# Patient Record
Sex: Male | Born: 1945 | Race: White | Hispanic: No | Marital: Married | Smoking: Former smoker
Health system: Southern US, Community
[De-identification: ages and names within clinical notes are randomized; demographics above are authoritative.]

## PROBLEM LIST (undated history)

## (undated) DIAGNOSIS — H9190 Unspecified hearing loss, unspecified ear: Secondary | ICD-10-CM

## (undated) DIAGNOSIS — M1990 Unspecified osteoarthritis, unspecified site: Secondary | ICD-10-CM

## (undated) DIAGNOSIS — D649 Anemia, unspecified: Secondary | ICD-10-CM

## (undated) DIAGNOSIS — Z992 Dependence on renal dialysis: Secondary | ICD-10-CM

## (undated) DIAGNOSIS — E039 Hypothyroidism, unspecified: Secondary | ICD-10-CM

## (undated) DIAGNOSIS — I350 Nonrheumatic aortic (valve) stenosis: Secondary | ICD-10-CM

## (undated) DIAGNOSIS — G4730 Sleep apnea, unspecified: Secondary | ICD-10-CM

## (undated) DIAGNOSIS — Z9889 Other specified postprocedural states: Secondary | ICD-10-CM

## (undated) DIAGNOSIS — I209 Angina pectoris, unspecified: Secondary | ICD-10-CM

## (undated) DIAGNOSIS — N289 Disorder of kidney and ureter, unspecified: Secondary | ICD-10-CM

## (undated) DIAGNOSIS — N189 Chronic kidney disease, unspecified: Secondary | ICD-10-CM

## (undated) DIAGNOSIS — E785 Hyperlipidemia, unspecified: Secondary | ICD-10-CM

## (undated) DIAGNOSIS — I499 Cardiac arrhythmia, unspecified: Secondary | ICD-10-CM

## (undated) DIAGNOSIS — C801 Malignant (primary) neoplasm, unspecified: Secondary | ICD-10-CM

## (undated) DIAGNOSIS — R0600 Dyspnea, unspecified: Secondary | ICD-10-CM

## (undated) DIAGNOSIS — Z720 Tobacco use: Secondary | ICD-10-CM

## (undated) DIAGNOSIS — J449 Chronic obstructive pulmonary disease, unspecified: Secondary | ICD-10-CM

## (undated) DIAGNOSIS — I2119 ST elevation (STEMI) myocardial infarction involving other coronary artery of inferior wall: Secondary | ICD-10-CM

## (undated) DIAGNOSIS — F419 Anxiety disorder, unspecified: Secondary | ICD-10-CM

## (undated) DIAGNOSIS — I2510 Atherosclerotic heart disease of native coronary artery without angina pectoris: Secondary | ICD-10-CM

## (undated) DIAGNOSIS — I10 Essential (primary) hypertension: Secondary | ICD-10-CM

## (undated) DIAGNOSIS — R011 Cardiac murmur, unspecified: Secondary | ICD-10-CM

## (undated) DIAGNOSIS — I639 Cerebral infarction, unspecified: Secondary | ICD-10-CM

## (undated) DIAGNOSIS — K219 Gastro-esophageal reflux disease without esophagitis: Secondary | ICD-10-CM

## (undated) DIAGNOSIS — Z8719 Personal history of other diseases of the digestive system: Secondary | ICD-10-CM

## (undated) DIAGNOSIS — R112 Nausea with vomiting, unspecified: Secondary | ICD-10-CM

## (undated) HISTORY — DX: Nonrheumatic aortic (valve) stenosis: I35.0

## (undated) HISTORY — DX: Hyperlipidemia, unspecified: E78.5

## (undated) HISTORY — DX: Tobacco use: Z72.0

## (undated) HISTORY — PX: HERNIA REPAIR: SHX51

## (undated) HISTORY — PX: CARDIAC CATHETERIZATION: SHX172

## (undated) HISTORY — DX: Atherosclerotic heart disease of native coronary artery without angina pectoris: I25.10

## (undated) HISTORY — PX: FOOT SURGERY: SHX648

## (undated) HISTORY — PX: NOSE SURGERY: SHX723

## (undated) HISTORY — PX: APPENDECTOMY: SHX54

## (undated) HISTORY — DX: Essential (primary) hypertension: I10

---

## 2000-11-10 ENCOUNTER — Ambulatory Visit (HOSPITAL_COMMUNITY): Admission: RE | Admit: 2000-11-10 | Discharge: 2000-11-10 | Payer: Self-pay | Admitting: Family Medicine

## 2000-11-10 ENCOUNTER — Encounter: Payer: Self-pay | Admitting: Family Medicine

## 2000-11-21 ENCOUNTER — Encounter: Payer: Self-pay | Admitting: Family Medicine

## 2000-11-21 ENCOUNTER — Ambulatory Visit (HOSPITAL_COMMUNITY): Admission: RE | Admit: 2000-11-21 | Discharge: 2000-11-21 | Payer: Self-pay | Admitting: Family Medicine

## 2000-12-25 ENCOUNTER — Encounter: Payer: Self-pay | Admitting: Cardiology

## 2000-12-25 ENCOUNTER — Ambulatory Visit (HOSPITAL_COMMUNITY): Admission: RE | Admit: 2000-12-25 | Discharge: 2000-12-25 | Payer: Self-pay | Admitting: Cardiology

## 2003-03-13 ENCOUNTER — Observation Stay (HOSPITAL_COMMUNITY): Admission: EM | Admit: 2003-03-13 | Discharge: 2003-03-14 | Payer: Self-pay | Admitting: Emergency Medicine

## 2005-08-17 ENCOUNTER — Ambulatory Visit (HOSPITAL_COMMUNITY): Admission: RE | Admit: 2005-08-17 | Discharge: 2005-08-17 | Payer: Self-pay | Admitting: Family Medicine

## 2006-06-07 ENCOUNTER — Encounter: Payer: Self-pay | Admitting: Emergency Medicine

## 2006-06-07 ENCOUNTER — Ambulatory Visit: Payer: Self-pay | Admitting: Cardiology

## 2006-06-07 ENCOUNTER — Encounter: Payer: Self-pay | Admitting: Vascular Surgery

## 2006-06-07 ENCOUNTER — Inpatient Hospital Stay (HOSPITAL_COMMUNITY): Admission: AD | Admit: 2006-06-07 | Discharge: 2006-06-19 | Payer: Self-pay | Admitting: Cardiology

## 2006-06-08 ENCOUNTER — Encounter: Payer: Self-pay | Admitting: Vascular Surgery

## 2006-06-14 HISTORY — PX: CORONARY ARTERY BYPASS GRAFT: SHX141

## 2006-07-03 ENCOUNTER — Ambulatory Visit: Payer: Self-pay | Admitting: Cardiology

## 2006-07-04 ENCOUNTER — Ambulatory Visit: Payer: Self-pay | Admitting: Surgery

## 2006-07-11 ENCOUNTER — Encounter (HOSPITAL_COMMUNITY): Admission: RE | Admit: 2006-07-11 | Discharge: 2006-08-10 | Payer: Self-pay | Admitting: Cardiology

## 2006-07-17 ENCOUNTER — Ambulatory Visit: Payer: Self-pay | Admitting: Cardiology

## 2006-07-17 LAB — CONVERTED CEMR LAB
ALT: 24 units/L (ref 0–40)
AST: 19 units/L (ref 0–37)
Albumin: 3.5 g/dL (ref 3.5–5.2)
Alkaline Phosphatase: 69 units/L (ref 39–117)
Bilirubin, Direct: 0.1 mg/dL (ref 0.0–0.3)
Cholesterol: 135 mg/dL (ref 0–200)
HDL: 24.4 mg/dL — ABNORMAL LOW (ref >39.0)
LDL Cholesterol: 80 mg/dL (ref 0–99)
Total Bilirubin: 0.6 mg/dL (ref 0.3–1.2)
Total CHOL/HDL Ratio: 5.5
Total Protein: 6.4 g/dL (ref 6.0–8.3)
Triglycerides: 155 mg/dL — ABNORMAL HIGH (ref 0–149)
VLDL: 31 mg/dL (ref 0–40)

## 2006-08-09 ENCOUNTER — Ambulatory Visit: Payer: Self-pay | Admitting: Cardiology

## 2006-08-11 ENCOUNTER — Encounter (HOSPITAL_COMMUNITY): Admission: RE | Admit: 2006-08-11 | Discharge: 2006-09-10 | Payer: Self-pay | Admitting: Cardiology

## 2006-09-07 ENCOUNTER — Ambulatory Visit: Payer: Self-pay | Admitting: Cardiology

## 2006-09-11 ENCOUNTER — Encounter (HOSPITAL_COMMUNITY): Admission: RE | Admit: 2006-09-11 | Discharge: 2006-10-11 | Payer: Self-pay | Admitting: Cardiology

## 2006-10-20 ENCOUNTER — Ambulatory Visit: Payer: Self-pay | Admitting: Cardiology

## 2007-04-17 ENCOUNTER — Ambulatory Visit: Payer: Self-pay | Admitting: Cardiology

## 2007-05-22 ENCOUNTER — Ambulatory Visit: Payer: Self-pay

## 2007-05-22 ENCOUNTER — Ambulatory Visit: Payer: Self-pay | Admitting: Cardiology

## 2007-05-22 LAB — CONVERTED CEMR LAB
ALT: 22 units/L (ref 0–53)
AST: 20 units/L (ref 0–37)
Albumin: 3.8 g/dL (ref 3.5–5.2)
Alkaline Phosphatase: 62 units/L (ref 39–117)
Bilirubin, Direct: 0.2 mg/dL (ref 0.0–0.3)
Cholesterol: 167 mg/dL (ref 0–200)
Direct LDL: 107.1 mg/dL
HDL: 20.9 mg/dL — ABNORMAL LOW (ref >39.0)
Total Bilirubin: 0.9 mg/dL (ref 0.3–1.2)
Total CHOL/HDL Ratio: 8
Total Protein: 7.1 g/dL (ref 6.0–8.3)
Triglycerides: 207 mg/dL (ref 0–149)
VLDL: 41 mg/dL — ABNORMAL HIGH (ref 0–40)

## 2008-04-23 ENCOUNTER — Ambulatory Visit: Payer: Self-pay | Admitting: Cardiology

## 2008-04-23 LAB — CONVERTED CEMR LAB
ALT: 27 units/L (ref 0–53)
AST: 24 units/L (ref 0–37)
Albumin: 4.2 g/dL (ref 3.5–5.2)
Alkaline Phosphatase: 72 units/L (ref 39–117)
BUN: 15 mg/dL (ref 6–23)
Bilirubin, Direct: 0.1 mg/dL (ref 0.0–0.3)
CO2: 30 meq/L (ref 19–32)
Calcium: 9.5 mg/dL (ref 8.4–10.5)
Chloride: 97 meq/L (ref 96–112)
Cholesterol: 176 mg/dL (ref 0–200)
Creatinine, Ser: 1.1 mg/dL (ref 0.4–1.5)
GFR calc Af Amer: 88 mL/min
GFR calc non Af Amer: 72 mL/min
Glucose, Bld: 89 mg/dL (ref 70–99)
HDL: 18.9 mg/dL — ABNORMAL LOW (ref >39.0)
LDL Cholesterol: 118 mg/dL — ABNORMAL HIGH (ref 0–99)
Potassium: 4.2 meq/L (ref 3.5–5.1)
Sodium: 134 meq/L — ABNORMAL LOW (ref 135–145)
Total Bilirubin: 0.8 mg/dL (ref 0.3–1.2)
Total CHOL/HDL Ratio: 9.3
Total Protein: 7.3 g/dL (ref 6.0–8.3)
Triglycerides: 194 mg/dL — ABNORMAL HIGH (ref 0–149)
VLDL: 39 mg/dL (ref 0–40)

## 2008-09-19 ENCOUNTER — Encounter: Payer: Self-pay | Admitting: Cardiology

## 2008-10-02 ENCOUNTER — Encounter: Payer: Self-pay | Admitting: Cardiology

## 2008-10-10 ENCOUNTER — Encounter (INDEPENDENT_AMBULATORY_CARE_PROVIDER_SITE_OTHER): Payer: Self-pay | Admitting: *Deleted

## 2009-01-17 DIAGNOSIS — F17200 Nicotine dependence, unspecified, uncomplicated: Secondary | ICD-10-CM

## 2009-01-17 DIAGNOSIS — I10 Essential (primary) hypertension: Secondary | ICD-10-CM | POA: Insufficient documentation

## 2009-01-22 ENCOUNTER — Ambulatory Visit: Payer: Self-pay | Admitting: Cardiology

## 2009-01-22 DIAGNOSIS — R0789 Other chest pain: Secondary | ICD-10-CM | POA: Insufficient documentation

## 2009-01-22 DIAGNOSIS — E782 Mixed hyperlipidemia: Secondary | ICD-10-CM | POA: Insufficient documentation

## 2009-01-27 ENCOUNTER — Telehealth (INDEPENDENT_AMBULATORY_CARE_PROVIDER_SITE_OTHER): Payer: Self-pay

## 2009-01-28 ENCOUNTER — Ambulatory Visit: Payer: Self-pay | Admitting: Cardiology

## 2009-01-28 ENCOUNTER — Ambulatory Visit: Payer: Self-pay

## 2009-01-28 ENCOUNTER — Encounter: Payer: Self-pay | Admitting: Cardiology

## 2009-01-29 ENCOUNTER — Encounter (INDEPENDENT_AMBULATORY_CARE_PROVIDER_SITE_OTHER): Payer: Self-pay | Admitting: *Deleted

## 2009-01-29 LAB — CONVERTED CEMR LAB
ALT: 28 units/L (ref 0–53)
AST: 25 units/L (ref 0–37)
Albumin: 4 g/dL (ref 3.5–5.2)
Alkaline Phosphatase: 76 units/L (ref 39–117)
BUN: 19 mg/dL (ref 6–23)
Bilirubin, Direct: 0.1 mg/dL (ref 0.0–0.3)
CO2: 30 meq/L (ref 19–32)
Calcium: 9.4 mg/dL (ref 8.4–10.5)
Chloride: 101 meq/L (ref 96–112)
Cholesterol: 147 mg/dL (ref 0–200)
Creatinine, Ser: 1.1 mg/dL (ref 0.4–1.5)
Direct LDL: 88.9 mg/dL
GFR calc non Af Amer: 71.97 mL/min (ref >60)
Glucose, Bld: 90 mg/dL (ref 70–99)
HDL: 23.8 mg/dL — ABNORMAL LOW (ref >39.00)
Potassium: 4.4 meq/L (ref 3.5–5.1)
Sodium: 138 meq/L (ref 135–145)
Total Bilirubin: 0.7 mg/dL (ref 0.3–1.2)
Total CHOL/HDL Ratio: 6
Total Protein: 7.8 g/dL (ref 6.0–8.3)
Triglycerides: 237 mg/dL — ABNORMAL HIGH (ref 0.0–149.0)
VLDL: 47.4 mg/dL — ABNORMAL HIGH (ref 0.0–40.0)

## 2010-01-20 ENCOUNTER — Ambulatory Visit (HOSPITAL_COMMUNITY): Admission: RE | Admit: 2010-01-20 | Discharge: 2010-01-20 | Payer: Self-pay | Admitting: Internal Medicine

## 2010-02-02 ENCOUNTER — Ambulatory Visit: Payer: Self-pay | Admitting: Cardiology

## 2010-02-02 DIAGNOSIS — E663 Overweight: Secondary | ICD-10-CM | POA: Insufficient documentation

## 2010-02-10 ENCOUNTER — Ambulatory Visit: Payer: Self-pay | Admitting: Cardiology

## 2010-02-12 LAB — CONVERTED CEMR LAB
ALT: 30 units/L (ref 0–53)
AST: 28 units/L (ref 0–37)
Albumin: 3.9 g/dL (ref 3.5–5.2)
Alkaline Phosphatase: 69 units/L (ref 39–117)
BUN: 35 mg/dL — ABNORMAL HIGH (ref 6–23)
Bilirubin, Direct: 0.2 mg/dL (ref 0.0–0.3)
CO2: 30 meq/L (ref 19–32)
Calcium: 9.4 mg/dL (ref 8.4–10.5)
Chloride: 102 meq/L (ref 96–112)
Cholesterol: 168 mg/dL (ref 0–200)
Creatinine, Ser: 1.5 mg/dL (ref 0.4–1.5)
Direct LDL: 87.8 mg/dL
GFR calc non Af Amer: 49.77 mL/min (ref >60)
Glucose, Bld: 104 mg/dL — ABNORMAL HIGH (ref 70–99)
HDL: 26.4 mg/dL — ABNORMAL LOW (ref >39.00)
Potassium: 4.1 meq/L (ref 3.5–5.1)
Sodium: 139 meq/L (ref 135–145)
Total Bilirubin: 0.5 mg/dL (ref 0.3–1.2)
Total CHOL/HDL Ratio: 6
Total Protein: 6.9 g/dL (ref 6.0–8.3)
Triglycerides: 348 mg/dL — ABNORMAL HIGH (ref 0.0–149.0)
VLDL: 69.6 mg/dL — ABNORMAL HIGH (ref 0.0–40.0)

## 2010-03-09 ENCOUNTER — Telehealth (INDEPENDENT_AMBULATORY_CARE_PROVIDER_SITE_OTHER): Payer: Self-pay | Admitting: *Deleted

## 2010-03-09 ENCOUNTER — Encounter: Payer: Self-pay | Admitting: Cardiology

## 2010-03-24 ENCOUNTER — Telehealth (INDEPENDENT_AMBULATORY_CARE_PROVIDER_SITE_OTHER): Payer: Self-pay | Admitting: *Deleted

## 2010-03-26 ENCOUNTER — Ambulatory Visit (HOSPITAL_BASED_OUTPATIENT_CLINIC_OR_DEPARTMENT_OTHER): Admission: RE | Admit: 2010-03-26 | Discharge: 2010-03-26 | Payer: Self-pay | Admitting: Specialist

## 2010-06-15 NOTE — Progress Notes (Signed)
Summary: Records Request   Faxed OV, EKG & Stress to [facility] at [facility] ([phone]). [name]  March 24, 2010 11:01 AM

## 2010-06-15 NOTE — Letter (Signed)
Summary: [facility] Office Visit Note   [facility] Office Visit Note   Imported By: Sallee Provencal 03/19/2010 16:19:40  _____________________________________________________________________  External Attachment:    Type:   Image     Comment:   External Document

## 2010-06-15 NOTE — Letter (Signed)
Summary: [facility] Surgical Clearance   [facility] Surgical Clearance   Imported By: Sallee Provencal 04/01/2010 17:30:43  _____________________________________________________________________  External Attachment:    Type:   Image     Comment:   External Document

## 2010-06-15 NOTE — Assessment & Plan Note (Signed)
Summary: yearly/sl  Medications Added METOPROLOL SUCCINATE 50 MG XR24H-TAB (METOPROLOL SUCCINATE) one tab once daily LISINOPRIL-HYDROCHLOROTHIAZIDE 20-25 MG TABS (LISINOPRIL-HYDROCHLOROTHIAZIDE) 1 tab once daily LIPITOR 80 MG TABS (ATORVASTATIN CALCIUM) 1 tab at bedtime NITROGLYCERIN 0.4 MG SUBL (NITROGLYCERIN) One tablet under tongue every 5 minutes as needed for chest pain---may repeat times three NAPROXEN 375 MG TABS (NAPROXEN) 1 two times a day      Allergies Added: NKDA  Visit Type:  1 yr f/u Primary Provider:  Dr. Gerarda Fraction  CC:  pt c/o left knee pain...denies any other complaints today.  History of Present Illness: Mr. Melendrez returns today for followup of his cardiovascular disease, status post cardiac bypass grafting 3 years ago. He has risk factors of obesity, tobacco use, hyperlipidemia being treated with a statin, and hypertension.  He lost his job. This was one of the loves  of his life. He is currently smoking about a pack of cigarettes per day. His weight is deteriorated. He is having a lot of problems with his left knee and was prescribed an anti-inflammatory by his primary care. He wanted to talk to me prior to taking it.  He is compliant with his medications. He is having no angina or ischemic symptoms. His last stress test was in September 2010 and low risk.  Current Medications (verified): 1)  Metoprolol Succinate 50 Mg Xr24h-Tab (Metoprolol Succinate) .... One Tab Once Daily 2)  Lisinopril-Hydrochlorothiazide 20-25 Mg Tabs (Lisinopril-Hydrochlorothiazide) .Marland Kitchen.. 1 Tab Once Daily 3)  Lipitor 80 Mg Tabs (Atorvastatin Calcium) .Marland Kitchen.. 1 Tab At Bedtime 4)  Omeprazole 20 Mg Tbec (Omeprazole) .Marland Kitchen.. 1 Tab Once Daily 5)  Aspirin Ec 325 Mg Tbec (Aspirin) .... Take One Tablet By Mouth Daily 6)  Amlodipine Besylate 5 Mg Tabs (Amlodipine Besylate) .Marland Kitchen.. 1 Tab Once Daily 7)  Nitroglycerin 0.4 Mg Subl (Nitroglycerin) .... One Tablet Under Tongue Every 5 Minutes As Needed For Chest  Pain---May Repeat Times Three 8)  Ra Fish Oil 1000 Mg Caps (Omega-3 Fatty Acids) .Marland Kitchen.. 1 Two Times A Day  Allergies (verified): No Known Drug Allergies  Past History:  Past Medical History: Last updated: 2009/02/07 CAD, ARTERY BYPASS GRAFT/ JANUARY 2008 (ICD-414.04) HYPERLIPIDEMIA (ICD-272.4) HYPERTENSION, UNSPECIFIED (ICD-401.9) TOBACCO USER (ICD-305.1)    Past Surgical History: Last updated: Feb 07, 2009 CABG x5..06/14/06  Family History: Last updated: February 07, 2009 Mother died at the age of 21 with a myocardial infarction, his father at the age of 71 with a CVA.  He has one brother  deceased at the age of 13 secondary to myocardial infarction.  He has two brothers at 21 and 67 that are fairly well. One and had a bypass  surgery.    Social History: Last updated: 02/07/09 He resides in [place] with his wife.  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  He continues  to smoke at least one to two packs per day and has been doing so for 40 years.  He states that he may have a six-pack over a month.  Denies any  drug or herbal medications.  He states that he does not watch a specific  diet.  However, recently he has changed to diet soda and has lost 40  pounds over an unspecified duration of time. He denies any specific  exercise.  Review of Systems       negative other than history of present illness  Vital Signs:  Patient profile:   65 year old male Height:      69 inches Weight:  242.8 pounds BMI:     35.98 Pulse rate:   66 / minute Pulse rhythm:   regular BP sitting:   138 / 80  (left arm) Cuff size:   large  Vitals Entered By: Julaine Hua, CMA (February 02, 2010 2:17 PM)  Physical Exam  General:  obese.  no acute distressobese.   Head:  normocephalic and atraumatic Eyes:  PERRLA/EOM intact; conjunctiva and lids normal. Neck:  Neck supple, no JVD. No masses, thyromegaly or abnormal cervical nodes. Chest Chivon Lepage:  no deformities or breast  masses noted Lungs:  inspiratory expiratory rhonchi Heart:  Pdifficult to appreciate, normal S1-S2, no murmur or gallop. Carotids equal bilaterally without bruits Msk:  decreased ROM.  decreased ROM.   Pulses:  pulses normal in all 4 extremities Extremities:  trace left pedal edema and trace right pedal edema.  trace left pedal edema.   Neurologic:  Alert and oriented x 3. Skin:  Intact without lesions or rashes. Psych:  Normal affect.   Problems:  Medical Problems Added: 1)  Dx of Overweight/obesity  (ICD-278.02)  EKG  Procedure date:  02/02/2010  Findings:      normal sinus rhythm, normal EKG  Impression & Recommendations:  Problem # 1:  CAD, ARTERY BYPASS GRAFT/ JANUARY 2008 (ICD-414.04) Assessment Unchanged  His updated medication list for this problem includes:    Metoprolol Succinate 50 Mg Xr24h-tab (Metoprolol succinate) ..... One tab once daily    Lisinopril-hydrochlorothiazide 20-25 Mg Tabs (Lisinopril-hydrochlorothiazide) .Marland Kitchen... 1 tab once daily    Aspirin Ec 325 Mg Tbec (Aspirin) .Marland Kitchen... Take one tablet by mouth daily    Amlodipine Besylate 5 Mg Tabs (Amlodipine besylate) .Marland Kitchen... 1 tab once daily    Nitroglycerin 0.4 Mg Subl (Nitroglycerin) ..... One tablet under tongue every 5 minutes as needed for chest pain---may repeat times three  Problem # 2:  HYPERLIPIDEMIA, MIXED (ICD-272.2) Will schedule fasting lipids and comprehensive metabolic panel His updated medication list for this problem includes:    Lipitor 80 Mg Tabs (Atorvastatin calcium) .Marland Kitchen... 1 tab at bedtime  Problem # 3:  HYPERTENSION, UNSPECIFIED (ICD-401.9) Assessment: Improved  His updated medication list for this problem includes:    Metoprolol Succinate 50 Mg Xr24h-tab (Metoprolol succinate) ..... One tab once daily    Lisinopril-hydrochlorothiazide 20-25 Mg Tabs (Lisinopril-hydrochlorothiazide) .Marland Kitchen... 1 tab once daily    Aspirin Ec 325 Mg Tbec (Aspirin) .Marland Kitchen... Take one tablet by mouth daily     Amlodipine Besylate 5 Mg Tabs (Amlodipine besylate) .Marland Kitchen... 1 tab once daily  Orders: EKG w/ Interpretation (93000)  Problem # 4:  TOBACCO USER (ICD-305.1) Assessment: Unchanged advised to quit  Problem # 5:  OVERWEIGHT/OBESITY (ICD-278.02) Assessment: Deteriorated I have encouraged him to lose 20-25 pounds. This was really help his cardiovascular risk not to mention his chronic degenerative disease and both knees. Advised him not to take nonsteroidal for his knee and he let me write him a prescription for naproxen 375 mg p.o. b.i.d. He needs to take that with food to avoid any gastric irritation. His knee continues to bother him I advised him to seek orthopedic consultation.  Clinical Reports Reviewed:  CXR:  06/16/2006: CXR Results:   Clinical Data:  Postop CABG.   PORTABLE CHEST - 1 VIEW - 06/16/06 - [id number] HOURS:   Comparison:  06/15/06.   Findings:  The Swan-Ganz catheter and chest tubes have been removed   in the interval.  Right IJ sheath remains in the SVC.  The lung   volumes are slightly lower  with worsening bibasilar atelectasis.   There is no edema, pneumothorax or significant pleural effusion.   Cardiomediastinal contours appear stable.   IMPRESSION:   Slight worsening of bibasilar atelectasis.  No edema or pneumothorax.    Read By:  Vivia Ewing,  M.D.   Released By:  Vivia Ewing,  M.D.  Additional Information  External image : [phone]  06/14/2006: CXR Results:   Clinical Data:  CABG.   PORTABLE CHEST - 1 VIEW:   Comparison:  06/13/06.   Findings:  The endotracheal tube terminates approximately 2.1 cm   above the carina.  Nasogastric tube is followed into the stomach.   The right IJ Swan-Ganz catheter terminates in the right pulmonary   artery.  Mediastinal drain and left chest tubes are in place.  Lungs   are low in volume with left base atelectasis.  No pneumothorax.   IMPRESSION:   Left base atelectasis.  No pneumothorax.    Read By:   Luretha Rued.,  M.D.   Released By:  Luretha Rued.,  M.D.  Additional Information  External image : [id number]  Cardiac Cath:  06/07/2006: Cardiac Cath Findings:   CONCLUSION:  1. Preserved left ventricular function.  2. Significant coronary artery disease.   PLAN:  Surgical consultation will be obtained.   Loretha Brasil. Lia Foyer, MD, [facility]  Electronically Signed  12/09/1993: Cardiac Cath Findings:  [facility] : Conclusions: 1. nonobstructive coronary artery disease. 2. Normal left ventricular function.  Howell Rucks, MD  Nuclear Study:  01/28/2009:  Excerise capacity: Good exercise capacity  Blood Pressure response: Normal blood pressure response  Clinical symptoms: No chest pain  ECG impression: No significant ST segment change suggestive of ischemia  Overall impression: Low risk stress nuclear study with very mild inferior ischemia noted predominantly on the short axis images.  Kirk Ruths, MD, [facility]   05/22/2007:  Excerise capacity: Good exercise capcity  Blood Pressure response: Hypertensive blood pressure response (216/81)  Clinical symptoms: No chest pain or dyspnea  ECG impression: No significant ST segment change suggestive of ischemia  Overall impression: Low risk stress nuclear study as noted above.  Satira Sark, MD   12/25/2000:   NM MYOCARDIAL PERFUSION MULTIPLE W/SPECT AND EJECTION   FRACTION:  THE PATIENT UNDERWENT TREADMILL STRESS TEST ON BRUCE   PROTOCOL, EXERCISING 7 MINUTES 32 SECONDS AND ACHIEVING   98% OF TARGET HEART RATE.  AT PEAK EXERCISE, 29.8 MCI   TC-32M SESTAMIBI WAS INJECTED INTRAVENOUSLY FOR MYOCARDIAL   PERFUSION IMAGING.  RESTING EXAM PERFORMED PRIOR TO   EXERCISE USING 10 MCI TC-32M SESTAMIBI.   MYOCARDIAL PERFUSION SPECT IMAGES OBTAINED AFTER EXERCISE   REVEAL MINIMAL APICAL THINNING.  NO FOCAL PERFUSION   DEFECTS IDENTIFIED.  RESTING EXAM UNCHANGED.  NO PULMONARY   UPTAKE OF  TRACER.   NORMAL LEFT VENTRICULAR EJECTION FRACTION OF 71% AS   CALCULATED FROM THE GATED SPECT IMAGES AFTER EXERCISE.   THIS IS DERIVED FROM END DIASTOLIC VOLUME CALCULATION OF   82 ML AND END SYSTOLIC VOLUME OF 23 ML.   Dezarai Prew MOTION   ANALYSIS IS NORMAL.   IMPRESSION:  NORMAL EXAM.  MINIMAL APICAL THINNING NOTED.   TRANSCRIBED DATE:  MA  AS                                           Read [id number] A.  Thornton Papas, M.D.  12/26/2000 08:39                                           Released [id number]   A. Thornton Papas, M.D.   Patient Instructions: 1)  Your physician recommends that you schedule a follow-up appointment in: [place] 2)  Your physician recommends that you return for lab work in: BMET LIPID LIVER  FASTING 278.00 272.4 V58.69 3)  Your physician has recommended you make the following change in your medication:  ADD NAPROXEN 375 MG 1 two times a day TAKE WITH FOOD 4)  Your physician discussed the hazards of tobacco use.  Tobacco use cessation is recommended and techniques and options to help you quit were discussed. 5)  Your physician encouraged you to lose weight for better health.20 -25 POUNDS Prescriptions: NAPROXEN 375 MG TABS (NAPROXEN) 1 two times a day  #60 x 3   Entered by:   Devra Dopp, LPN   Authorized by:   Renella Cunas, MD, [facility]   Signed by:   Devra Dopp, LPN on [id number]   Method used:   Electronically to        [facility] [phone]* (retail)       [address]       [place], [place]  [address]       Ph: [phone] or [phone]       Fax: [phone]   RxIDAL:[id number] NITROGLYCERIN 0.4 MG SUBL (NITROGLYCERIN) One tablet under tongue every 5 minutes as needed for chest pain---may repeat times three  #25 x 7   Entered by:   Julaine Hua, CMA   Authorized by:   Renella Cunas, MD, [facility]   Signed by:   Julaine Hua, CMA on 02/02/2010   Method used:   Electronically to        [facility] [phone]* (retail)       [address]       [place], [place]  [address]       Ph: [phone] or [phone]       Fax: [phone]   RxID:   [id number] METOPROLOL SUCCINATE 50 MG XR24H-TAB (METOPROLOL SUCCINATE) one tab once daily  #90 x 3   Entered by:   Julaine Hua, CMA   Authorized by:   Renella Cunas, MD, [facility]   Signed by:   Julaine Hua, CMA on 02/02/2010   Method used:   Faxed to ...       [facility] Fax) (mail-order)             ,          Ph: [phone]       Fax: [phone]   RxID:   [id number] LIPITOR 80 MG TABS (ATORVASTATIN CALCIUM) 1 tab at bedtime  #90 x 3   Entered by:   Julaine Hua, CMA   Authorized by:   Renella Cunas, MD, [facility]   Signed by:   Julaine Hua, CMA on 02/02/2010   Method used:   Faxed to ...       [facility] Fax) Probation officer)             ,  Ph: [phone]       Fax: [phone]   RxIDDT:[id number] LISINOPRIL-HYDROCHLOROTHIAZIDE 20-25 MG TABS (LISINOPRIL-HYDROCHLOROTHIAZIDE) 1 tab once daily  #90 x 3   Entered by:   Julaine Hua, CMA   Authorized by:   Renella Cunas, MD, [facility]   Signed by:   Julaine Hua, CMA on 02/02/2010   Method used:   Faxed to ...       [facility] Fax) (mail-order)             ,          Ph: [phone]       Fax: [phone]   RxID:   [id number]

## 2010-06-15 NOTE — Progress Notes (Signed)
   Walk in Patient Form Recieved " Pt left Surgical Clearance from [facility]" sent to [facility]  March 09, 2010 3:34 PM

## 2010-07-27 LAB — POCT I-STAT 4, (NA,K, GLUC, HGB,HCT)
Glucose, Bld: 113 mg/dL — ABNORMAL HIGH (ref 70–99)
HCT: 46 % (ref 39.0–52.0)
Hemoglobin: 15.6 g/dL (ref 13.0–17.0)
Potassium: 5.7 mEq/L — ABNORMAL HIGH (ref 3.5–5.1)
Sodium: 135 mEq/L (ref 135–145)

## 2010-09-28 NOTE — Assessment & Plan Note (Signed)
[place] OFFICE NOTE   ALBA, SPANGLER                       MRN:          [id number]  DATE:10/20/2006                            DOB:          1946-04-14    Mr. Ehrsam comes in today for careful followup of his blood pressure and  his coronary artery disease.   He is now about five months out from his acute coronary syncope and  subsequent bypass surgery.  Other than a small rectus abdominis  herniation, he is doing remarkably well.  His blood pressure is always  high when he checks it, but it is fairly good today at 139/71.  His  biggest complaint is that he cannot sleep at night, which he attributes  to having way too much energy.  He says he feels the best he has ever  felt in years.   He also complains of erectile dysfunction on occasion.   He is currently on omeprazole 20 mg a day, aspirin 325 a day, metoprolol  succinate extended release 50 mg a day, lisinopril/HCTZ 20/25 daily,  Lipitor 80 mg a day.   PHYSICAL EXAMINATION:  VITAL SIGNS:  His blood pressure is 139/71, pulse  67 and regular.  His weight is 223.  GENERAL:  His skin color looks remarkably good.  HEART:  Regular rate and rhythm.  LUNGS:  Clear.  NECK:  Carotids are full.  ABDOMEN:  He has a small herniation at his xiphoid process that is not  expanding.  EXTREMITIES:  No edema.  Pulses are intact.   I have spent about 20 minutes answering questions for Mr. Kamath.  I  have offered to stop his metoprolol to see if it will help his insomnia  or inability to get to sleep and his erectile dysfunction.  As he points  out, he has been on this even prior to his bypass surgery.  He would  like to stay with this for the present time.  In addition, I offered  Viagra since he has good blood pressure control and his ischemic heart  disease is stable.  He declined this.   We will continue with his current program.  I will plan on seeing  him  back again in six months, which will be about a year out from his  infarct and bypass surgery.     Thomas C. Verl Blalock, MD, [facility]  Electronically Signed    TCW/MedQ  DD: 10/20/2006  DT: 10/20/2006  Job #: [id number]   cc:   [place]

## 2010-09-28 NOTE — Assessment & Plan Note (Signed)
[place] OFFICE NOTE   DEKLIN, KILMARTIN                       MRN:          [id number]  DATE:07/03/2006                            DOB:          02/20/1946    Mr. Couzens returns today after being discharged from [facility]  status post presentation of unstable angina, SVT, and coronary bypass  grafting.   He presented with SVT with classic acute coronary syndrome. His troponin  peaked at 0.76.   He had 3 vessel disease with a 90% proximal calcified mid LAD stenosis,  90% small diagonal stenosis, 50% in a second diagonal, 50% distal LAD  stenosis, 70-90% sequential proximal left circumflex stenosis, and a 40%  in the right coronary vessel. He had 3 small posterior lateral branches  off the RCA with an 80% proximal stenosis. Left ventricular systolic  function was normal.   He had been on Plavix for a history of a possible TIA. This had to be  held and he stayed in the hospital until he had bypass surgery on  June 14, 2006.   At that time, Dr. Cyndia Bent placed a left internal mammary graft to the  LAD, a vein graft to a second diagonal branch, sequential vein graft to  the first diagonal of the left anterior descending and obtuse marginal  branch of the left circ and a saphenous vein graft to the posterior  lateral branch of the right coronary artery.   Since discharge, he said he feels a lot better. He has not smoked since  the day of his admission! His breathing is a lot better and he says his  people at home and his friends say he looks remarkably better.   He is currently on Lipitor 80 mg a day which is a new drug, metoprolol  extended release 25 mg a day, aspirin 81 mg a day and Omeprazole 20 mg a  day which is an old drug.   PHYSICAL EXAMINATION:  VITAL SIGNS:  Blood pressure of 133/74, pulse 78  and he is in sinus rhythm. His electrocardiogram  is essentially normal  except for an RSR  prime in V1. His weight is 218.  SKIN:  His ski color looks remarkably better.  NEUROLOGIC:  Grossly intact.  HEENT:  Normocephalic, atraumatic. He is slightly pale. Facial symmetry  is normal.  NECK:  Supple, carotid upstrokes are equal bilaterally without bruits.  There is no obvious JVD. Thyroid is not enlarged, trachea is midline.  LUNGS:  Clear without rhonchi or wheezes. Sternotomy site is stable.  HEART:  Reveals a nondisplaced PMI. He has normal S1 and S2.  ABDOMEN:  Soft with good bowel sounds.  EXTREMITIES:  Reveal only trace edema. Pulses are present.  NEUROLOGIC:  Intact.   Chest x-ray shows him to be status post coronary bypass grafting with a  median sternotomy. His heart size is normal and his lungs are clear.  There is no effusions.   ASSESSMENT/PLAN:  Mr. Micucci is doing remarkably well post bypass. Other  than some residual soreness, he feels remarkably better. I  have asked  him to continue on his current medications and I renewed his Lipitor and  metoprolol. Will put him in for lipids and LFTs in about 3 weeks. I will  see him back in 2 months. He has an appointment with Dr. Gilford Raid  tomorrow.   Preoperative carotid Dopplers also show some nonobstructive plaque.  He  will need carotid Dopplers in a year.     Thomas C. Verl Blalock, MD, [facility]  Electronically Signed    TCW/MedQ  DD: 07/03/2006  DT: 07/04/2006  Job #: [id number]   cc:   Gilford Raid, M.D.  [facility], [place], [place]

## 2010-09-28 NOTE — Assessment & Plan Note (Signed)
[place] OFFICE NOTE   [name], [name]                       MRN:          [id number]  DATE:04/17/2007                            DOB:          1946-01-14    [name] returns today for further management of the following issues.  1. Coronary artery disease, status post coronary artery bypass      grafting January 2008.  2. Tobacco use, which he has quit.  3. Hypertension, which seems to be fairly labile.  4. Hyperlipidemia.   He offers no complaints today, except that he just stays stressed out  and worries all the time.  He is very compliant with his medications.  He continues to work about 60 hours a week, though he could retire.   His last total cholesterol was 135, triglycerides 155, HDL 24, baseline  22, LDL 80.   MEDICATIONS:  1. Omeprazole 20 mg a day.  2. Aspirin 325 mg daily.  3. Metoprolol succinate 50 mg a day.  4. Lisinopril/hydrochlorothiazide 20/25 daily.  5. Lipitor 80 mg a day.   His blood pressure is 166/79, pulse 59 and regular.  I rechecked, it was  150/ about 75.  His pulse is again checked at 60 and regular.  Weight is  217, down 3.  He has a very plethoric complexion.  PERRLA.  Extraocular muscles are  intact.  Sclerae clear.  Facial symmetry is normal.  Carotid upstrokes  are equal bilaterally without bruits.  No JVD.  Thyroid is not enlarged.  Trachea is midline.  LUNGS:  Relatively clear.  HEART:  Reveals a nondisplaced PMI.  Sternotomy site is stable.  Normal  S1, normal S2.  ABDOMEN:  Protuberant with good bowel sounds.  EXTREMITIES:  No cyanosis, clubbing, or edema.  Pulses are intact.  NEURO:  Intact.   Electrocardiogram is essentially normal.   [name] blood pressure is disturbing.  I have asked him to watch  this very closely.  He forgot to take his medicines yesterday.  Compliance was stressed.   I have arranged for him to have an exercise rest-stress  Myoview off his  metoprolol in January.  We will check fasting lipids and LFTs at that  time.   If this is negative, we will see him back again in a year.     Thomas C. Verl Blalock, MD, [facility]  Electronically Signed    TCW/MedQ  DD: 04/17/2007  DT: 04/17/2007  Job #: [id number]   cc:   [place]

## 2010-09-28 NOTE — Assessment & Plan Note (Signed)
[place] OFFICE NOTE   [name], [name]                       MRN:          [id number]  DATE:04/23/2008                            DOB:          [birth year]    [name] comes in today for followup.   He is doing remarkably well without any symptoms of ischemia.  Unfortunately, he continues to smoke, but he is down to a pack a day.  I  have encouraged him to decrease further.  Long time was spent on this.   He has hypertension and hyperlipidemia.  He has lost about 35 pounds and  his blood pressures have been under great control.   He is due blood work for his lipids.   His last stress Myoview was in January 2009, which showed an EF of 65%  but no ischemia.   CURRENT MEDICATIONS:  1. Omeprazole 20 mg a day.  2. Enteric-coated aspirin 325 mg per day.  3. Metoprolol succinate 50 mg a day.  4. Lisinopril/hydrochlorothiazide 20/25 daily.  5. Lipitor 80 mg daily.  6. Amlodipine 5 mg per day.   PHYSICAL EXAMINATION:  VITAL SIGNS:  Today, his blood pressure is  138/80, his pulse is 66 and regular, and his weight is 221.  HEENT:  Normal.  NECK:  Carotid upstrokes are equal bilaterally without bruits.  No JVD.  Thyroid is not enlarged.  Trachea is midline.  Neck is supple.  No  lymphadenopathy.  LUNGS:  Clear to auscultation and percussion with decreased breath  sounds throughout.  HEART:  Normal PMI.  Normal S1 and S2.  Soft systolic murmur along the  left sternal border.  S2 splits physiologically.  ABDOMEN:  Soft.  No midline bruit.  No pulsatile mass.  EXTREMITIES:  No cyanosis, clubbing, or edema.  Pulses are brisk.  There  is no sign of DVT.  NEURO:  Intact.  SKIN:  A few bruises.   EKG is essentially normal.   ASSESSMENT AND PLAN:  [name] is doing well.  My biggest concern for  him is his smoking related with his weight loss and his blood pressure  control.   PLAN:  1. Decrease smoking  as much as possible.  Counseling giving.  2. Lipids and comprehensive metabolic panel today.  3. See me back in a year.  At that time, he will probably need      objective assessment of his coronary artery disease.     Thomas C. Verl Blalock, MD, [facility]  Electronically Signed    TCW/MedQ  DD: 04/23/2008  DT: 04/24/2008  Job #: [id number]

## 2010-10-01 NOTE — Discharge Summary (Signed)
NAME:  [name], [name] NO.:  [id number]   MEDICAL RECORD NO.:  [id number]          PATIENT TYPE:  INP   LOCATION:  2012                         FACILITY:  [facility]   PHYSICIAN:  Gilford Raid, M.D.     DATE OF BIRTH:  03-30-1946   DATE OF ADMISSION:  06/07/2006  DATE OF DISCHARGE:  06/19/2006                               DISCHARGE SUMMARY   ADMISSION DIAGNOSIS:  Chest pain.   DISCHARGE DIAGNOSES:  1. Severe three-vessel coronary artery disease with non-ST segment      elevation, myocardial infarction, status post coronary artery      bypass graft.  2. Hypertension.  3. History of nonobstructive coronary artery disease, status post      catheterization at [facility] around 1996.  4. History of transient ischemic attack in October of 2004, at which      time he had difficulty with balance.  This has since been resolved      without treatment.  He was started on Plavix.  He has negative      workup for stroke, including a head CT and Doppler exam that showed      no significant disease.  5. Status post three hernia surgeries.  6. Postoperative acute blood loss anemia.  7. Postoperative volume overload.  8. Postoperative acute renal insufficiency.   PROCEDURES:  1. On June 07, 2006, the patient underwent cardiac catheterization      by Dr. Lia Foyer.  2. On June 14, 2006, the patient underwent coronary artery bypass      grafting x5 with LIMA to the LAD, saphenous vein graft to diagonal      OM, saphenous vein graft to diagonal 2, saphenous vein graft to the      PL and endoscopic vein harvesting in the right leg by Dr. Gilford Raid.   CONSULTATIONS:  On June 08, 2006, Dr. Gilford Raid was consulted for  cardiothoracic surgery.   HISTORY AND PHYSICAL:  This is a 65 year old male with multiple cardiac  risk factors, including hypertension, hyperlipidemia, family history,  and ongoing heavy tobacco abuse.  The patient presented to [facility]  after he developed he substernal chest pressure and burning with  radiating to his neck and left arm after eating.  He said that he had  been having these symptoms for at least the last 6-12 months, and he  felt that they were most likely due to heart burn.  They usually  occurred after 8 a.m. and resolved quickly.  He said this particular  episode did not resolve and was associated with diaphoresis, as well as  increased heart rate.  He stood up from the chair and developed dyspnea  and drove himself to [facility] emergency department.  There, the  patient was ruled in for a non-ST segment elevation MI with a troponin  that went up from 0.05 to 0.76.  He was then transferred to [facility] for further workup.   HOSPITAL COURSE:  June 07, 2006, the patient presented to [facility]  with SVT.  He was successfully converted with a cardiac massage.  The  patient did have chest pain.  He was given Lovenox, and transferred to  the cardiac catheterization room.  He underwent cardiac catheterization  by Dr. Lia Foyer, which showed no gradient across the aortic valve.  Left  ventricular function was normal.  He had severe 3-vessell coronary  artery disease.  The LAD had mild calcified 90% proximal stenosis.  There was 95% mid LAD stenosis after the 2nd diagonal branch.  Second  diagonal itself had about 50% osteostenosis.  There was a small 1st  diagonal branch that had 95% stenosis.  The left circumflex had a single  large marginal branch that had about 79% proximal stenosis.  The right  coronary large vessel had a 40% proximal stenosis.  There was about 80%  stenosis in the posterior lateral branch.  He had remained stable with  that chest pain.  The patient was felt to be a surgical candidate, and  he is scheduled for June 14, 2006.   Prior to surgery, the patient was maintained on an IV heparin drip per  the pharmacy.  He had no further episodes of chest pain.  His vital  signs  remained stable, and he remained afebrile.  He remained  hemodynamically stable, blood pressure well controlled.  The patient's  EKG remained stable without changes.  Prior to surgery, the patient's  creatinine was within normal limits.   On June 14, 2006, the patient underwent a CABG x 5 by Dr. Cyndia Bent  without any complications.  Postoperatively, the patient progressed  quite well.  He was extubated without any problems.  The patient was 96%  on 2 liters of oxygen on post-op day #1.  The patient was weaned  appropriately from his oxygen by post-op day #2 with 93% O2 sat on room  air.  He is doing incentive spirometry appropriately.  The patient's  chest x-ray on post-op day #1 was stable without pneumothorax.  He did  have some atelectasis.  He did have postoperative acute blood anemia;  however, he did not require any blood transfusions.  Hemoglobin stable  at 11.3 with a hematocrit of 33.  The patient did have some post-op  volume overload, and he was diuresed with Lasix appropriately.  On post-  op day #3, the patient did have some hypotension and his Lasix was held.  The patient's creatinine was slightly increased on post-op day #3.  Again, his Lasix was held, and also his Toradol for pain.  His  creatinine slowly resolved.  Creatinine on post-day #3, 1.8, previously  it was 1.2.  By post-day #5, his creatinine was back down to 1.2.  The  patient did have some hyponatremia; however, he was drinking large  amounts of water.  This did remain stable, and his sodium was 133 on  post-op day #5.  The patient did not have a history of diabetes  mellitus.  CBGs have been well controlled.   The patient maintained a normal sinus rhythm.  EKG was stable without  changes.  The patient was ambulating with cardiac rehab with a fairly  steady gait.  His incisions remained clean, dry and intact, and were  healing well.  DISCHARGE DISPOSITION:  The patient was discharged home on June 19, 2006 in stable condition.   MEDICATIONS:  1. Toprol-XL 25 mg p.o. daily.  2. Lipitor 40 mg p.o. daily.  3. Lasix 40 mg p.o. daily x14 days.  4. Potassium chloride  20 mEq p.o. daily x14 days.  5. __________.  6. __________.   DISCHARGE INSTRUCTIONS:  The patient was instructed to follow a low-fat,  low-salt diet.  No driving, heavy lifting __________.  The patient is to  ambulate 3-4 times daily and increase up to as tolerated.  May shower,  clean incision with mild soap and water.  __________ such as incision  erythema, __________temp greater than 101.5.   FOLLOWUP:  The patient will follow up with Dr. Cyndia Bent in 3 weeks.  Office is to contact him in time of the appointment.  He should call Dr.  Harrington Challenger for an appointment in 2 weeks.      Richardson Dopp, [place]      Gilford Raid, M.D.  Electronically Signed    JMW/MEDQ  D:  08/31/2006  T:  08/31/2006  Job:  [id number]

## 2010-10-01 NOTE — Cardiovascular Report (Signed)
NAME:  [name], [name] NO.:  [id number]   MEDICAL RECORD NO.:  [id number]          PATIENT TYPE:  INP   LOCATION:  3730                         FACILITY:  [facility]   PHYSICIAN:  Loretha Brasil. Lia Foyer, MD, FACCDATE OF BIRTH:  [birth year]   DATE OF PROCEDURE:  06/07/2006  DATE OF DISCHARGE:                            CARDIAC CATHETERIZATION   INDICATIONS:  [name] is a 65 year old who presented to the emergency  room with what appears to be a supraventricular tachycardia.  He was  successfully converted with a carotid massage.  He had chest pain.  He  is not sure which developed first.  However, the patient had a  borderline troponin.  He was given enoxaparin and transferred to the  cardiac catheterization for further evaluation.   PROCEDURE:  1. Left heart catheterization.  2. Selective coronary arteriography.  3. Selective left ventriculography.  4. A subclavian angiography because of need for bypass.  5. Distal aortography.   DESCRIPTION OF PROCEDURE:  The patient was brought to the  catheterization laboratory and prepped and draped in the usual fashion.  Through an anterior puncture, the femoral artery was entered.  A 5-  French sheath was placed.  Views of the left and right coronary arteries  were then obtained in multiple angiographic projections.  We had some  difficulty initially getting the wire up, and because of this, we  elected to take a shot of the distal aorta at the completion of the  procedure.  A subclavian view was obtained when it became obvious that  the patient would need bypass surgery.  Central aortic and left  ventricular pressures were measured with a pigtail.  Ventriculography  was performed in the RAO projection.  There were no complications.  A  femoral angiogram was performed, but the patient had some irregularity  just inside the femoral vessel.  We elected not to close the artery.   HEMODYNAMIC DATA:  1. Central aortic pressure  162/76, mean 110.  2. Left ventricular pressure 156/12.  3. No obvious gradient on pullback across the aortic valve.   ANGIOGRAPHIC DATA:  1. On plain fluoroscopy there is extensive calcification, and this      involves all three coronary vessels.  2. The left main does not have significant obstruction, albeit there      is some evidence of calcification.  3. The LAD is a heavily calcified vessel.  In the midportion, there is      a long segmental area of plaquing which is calcified and up to 90%      luminal reduction.  After the major second diagonal, there was a      second area of about 90% narrowing.  The distal vessel has some      diffuse luminal irregularities, and there is some calcification but      the vessel does appear to be suitable for grafting.  There is a      proximal diagonal branch which is 95% and only small to moderate in      size, but has a 95% ostial narrowing and  is subtotally occluded.      There is a second diagonal branch which has takeoff between the      origins of the two major stenoses, and it has about 95% narrowing.  4. The circumflex has a small intermediate vessel and a tiny first      marginal which is free of critical disease.  There is a major      marginal branch, and this represents truly the first large marginal      with about 50-70% ostial narrowing and then a 90% focal stenosis      leading into the major marginal.  The distal marginal appears to be      without critical narrowing.  The distal circumflex appears to be      without critical narrowing.  The circumflex in its proximal portion      is also heavily calcified.  5. The right coronary artery is heavily calcified.  There is probably      segmental plaquing that measures about 30-40% near the first      junction.  This leads into an acute marginal takeoff which supplies      the majority of the distal inferior wall.  The proximal inferior      wall is supplied by two tiny PDA  branches that are without critical      narrowing. The two posterolateral branches have about 50% at the      initial bifurcation and then the first posterolateral branch has      probably 80% ostial narrowing and is a small caliber vessel,      perhaps about 1.5 to 2 mm in size.  6. Ventriculography in the RAO projection reveals vigorous global      systolic function.  On a sinus beat, there is not significant      mitral regurgitation.  7. The subclavian appears to be widely patent.  8. The distal aortogram briefly shows the renal arteries which for the      most part appear to be patent.  There is no significant aneurysmal      dilatation distally to the bifurcation, although there is some      calcified plaquing.   CONCLUSION:  1. Preserved left ventricular function.  2. Significant coronary artery disease.   PLAN:  Surgical consultation will be obtained.      Loretha Brasil. Lia Foyer, MD, [facility]  Electronically Signed     TDS/MEDQ  D:  06/07/2006  T:  06/07/2006  Job:  [id number]   cc:   Mark Bjornstad, MD, [facility]  Patient's medical record  [name]. Mark Blalock, MD, [place] [name], [facility]

## 2010-10-01 NOTE — Consult Note (Signed)
NAME:  Mark, Vance NO.:  [id number]   MEDICAL RECORD NO.:  [id number]                   PATIENT TYPE:  INP   LOCATION:  A216                                 FACILITY:  [facility]   PHYSICIAN:  Scarlett Presto, M.D.                DATE OF BIRTH:  1945/07/21   DATE OF CONSULTATION:  03/14/2003  DATE OF DISCHARGE:                                   CONSULTATION   PRIMARY CARDIOLOGIST:  Marijo Conception. Wall, M.D.   HISTORY OF PRESENT ILLNESS:  Mr. Gamboa is a 65 year old man with known  coronary artery disease status post catheterization about eight years ago at  [facility] where he had nonobstructive disease with  normal left ventricular function.  He is followed by my colleague, Dr. Mar Daring, for routine secondary prophylaxis and had an exercise Cardiolite done  a couple of years ago which showed a normal ejection fraction with minimal  apical thinning.  Yesterday, he had an episode of feeling extremely off-  balance with triple vision, that lasted about 45 minutes while he was at  work doing some physical exertion.  He reports that this was associated with  some nausea, diaphoresis, and very mild dull ache in the center of his chest  with left arm pain and numbness lasting about 10-15 minutes and then  resolved.  He has not had any other episodes.  The resolution was  spontaneous.   PAST MEDICAL HISTORY:  Significant for hypertension for the last 10 years.  He is an active smoker.  He has had a herniorrhaphy.  As I said, he had  previously been evaluated for coronary disease with heart catheterization, I  think he has had two now, the results of which are not available to me, and  an exercise Cardiolite which was done in August 2002, which showed minimal  apical thinning and ejection fraction of 71% with no wall motion  abnormalities, read by the radiology group.   CURRENT MEDICATIONS:  In the hospital, he is on:  1. Plavix 75 mg  daily.  2. Hydrochlorothiazide 25 mg daily.  3. Lisinopril 20 mg twice daily.  4. Lopressor 25 mg twice daily.   SOCIAL/FAMILY HISTORY:  He lives in [place], [place], with his  wife.  He works for [employer].  He has two children, a son who had a  myocardial infarction at age 106.  He has a 40-pack-year smoking history.  He  is an active tobacco smoker.  His father died of a CVA at 63.  His mother  died of a myocardial infarction at 86.  He has one brother, in his 50s, with  a CVA.  He does not use any drugs or any alcohol.   REVIEW OF SYSTEMS:  Noncontributory, other than that mentioned in the  history of present illness.   PHYSICAL EXAMINATION:  GENERAL:  He is a  well-developed, well-nourished  white male in no apparent distress.  He is alert and oriented x4.  VITAL SIGNS:  His blood pressure is 109/66, his pulse is 53, respiratory  rate 20, he weighs 230 pounds.  HEENT:  Unremarkable.  I did not do a funduscopic exam.  NECK:  Supple.  He has no jugular venous distention or carotid bruits.  CHEST:  Clear to auscultation.  HEART:  Reveals a regular rate and rhythm with no murmurs, rubs, or gallops.  ABDOMEN:  Soft, nontender, normoactive bowel sounds.  EXTREMITIES:  The lower extremities are without significant clubbing,  cyanosis, or edema.  He has 1 to 2+ pulses throughout with no bruits noted.  NEUROLOGIC:  Detailed is completely nonfocal.   His chest x-ray shows no active disease.  Electrocardiogram is interesting,  he is in sinus bradycardia at a rate of 48 with a normal interval, normal  axes.  He has a Q wave in lead 3 with a question of a diagnostic Q wave in  lead AVF with an inverted T wave also in lead 3, and mild left ventricular  hypertrophy, by voltage.  Comparing that to an old EKG, the inverted T wave  and the Q wave in lead AVF are new.   LABORATORY DATA:  White blood cell count 7.4, H&H of 14.5 and 43, platelet  count of 251.  Sodium 135, potassium 4.1,  chloride 104, bicarb of 26.  His  BUN is 18, his creatinine is 1.3, and his blood sugar is 101.  He has two  sets of cardiac enzymes which are completely inconsistent with acute  myocardial infarction, and his erythrocyte sedimentation rate is 14.   IMPRESSION:  We have a gentleman with an interesting history of a transient  neurologic event.  He is currently undergoing an evaluation.  He has a head  CT which showed no significant disease.  We are waiting for an MRI result.  We are going to do carotid Dopplers on him as well.  As far as his chest  pain is concerned, I am uncertain as to its etiology, but he certainly has a  history of coronary artery disease in the past.  His EKG changes are very  nonspecific, and, at this point, I do not think we can make a definitive  diagnosis of myocardial infarction.   PLAN:  My plan is to do an echocardiogram, which is a reasonable workup,  both for the question of an embolic phenomenon as well as a history of  coronary disease.  We will look for wall motion and ejection fraction.  If  that looks normal and his neurologic workup is not illustrative, I think he  could probably get an outpatient Cardiolite.      ___________________________________________                                            Scarlett Presto, M.D.   JH/MEDQ  D:  03/14/2003  T:  03/14/2003  Job:  [id number]

## 2010-10-01 NOTE — Assessment & Plan Note (Signed)
[place] OFFICE NOTE   [name], [name]                       MRN:          [id number]  DATE:08/09/2006                            DOB:          [birth year]    [name] comes in today because of worries about his blood pressure.  During rehab his diastolics have been running most of the time quite  good; however systolics have been running anywhere from 140 to 180 prior  to exercise.  After exercise they were running about 140 to 170.   He feels remarkably good.  He has no other complaints. He is back at  work and in fact worked 11-1/2 hours yesterday!  He still has a little  bit of sternal soreness.   MEDICATIONS:  1. Lipitor 80 mg daily.  2. Omeprazole 20 mg daily.  3. Metoprolol Extended Release 25 mg daily.  4. Aspirin 325 daily.   His blood work July 17, 2006 show a total cholesterol of 135, LDL of  80, HDL of 24, triglycerides 155 down from 321.  LFTs were normal.  In  the hospital his creatinine was normal.   His blood pressure today is 140/90, his pulse 69 and regular.  Weight is  225, up 7, but he has got on a lot of heavy clothes today.  HEENT:  Normocephalic, atraumatic.  His skin color is remarkably  improved.  PERRLA, extraocular is intact.  Sclerae is clear.  Facial  asymmetry is normal.  Carotid upstrokes are equal bilaterally without bruits.  Thyroid is not  enlarged.  Trachea is midline.  LUNGS:  Were clear.  HEART:  Reveals a normal rate and rhythm.  He has normal S1, S2.  The  sternotomy site is healing appropriately.  ABDOMEN:  Is soft, good bowel sounds.  EXTREMITIES:  Reveal only trace edema on the right.  Pulses are intact.  NEURO:  Exam is intact.   ASSESSMENT:  1. Hypertension.  2. Coronary artery disease.  3. Mixed hyperlipidemia.   PLAN:  1. Add Lisinopril 20 mg p.o. q. a.m.  2. Increase metoprolol to 50 mg daily.   He will follow up with [place] on April 17.  We  will check a chem-7 that day  and blood pressures.  Hopefully at that time we can schedule annual  followups.  He has non-obstructive carotid disease preop.  We will have  to do carotid Doppler's annually.     Thomas C. Verl Blalock, MD, [facility]  Electronically Signed    TCW/MedQ  DD: 08/09/2006  DT: 08/09/2006  Job #: [id number]   cc:   [place]

## 2010-10-01 NOTE — Consult Note (Signed)
NAME:  [name], [name] NO.:  [id number]   MEDICAL RECORD NO.:  [id number]          PATIENT TYPE:  INP   LOCATION:  3730                         FACILITY:  [facility]   PHYSICIAN:  Gilford Raid, M.D.     DATE OF BIRTH:  1946-03-23   DATE OF CONSULTATION:  06/08/2006  DATE OF DISCHARGE:                                 CONSULTATION   REASON FOR CONSULTATION:  Severe three-vessel coronary disease with a  non-ST segment elevation myocardial infarction.   CLINICAL HISTORY:  This patient is a 65 year old gentleman with multiple  cardiac risk factors, including hypertension, hyperlipidemia, family  history, ongoing heavy smoking.  He presented to [facility]  after he developed substernal chest pressure and burning pain radiating  into his neck and left arm after eating.  He said that he has been  getting these symptoms for at least the last 6-12 months, and he felt  they were most likely heartburn.  The usually occur after eating and  resolve quickly.  He said this particular episode did not resolve, and  was associated with diaphoresis as well as an increased heart rate --  which he knew was not normal.  He stood up from the chair and developed  dizziness, and drove himself to the [facility] Emergency Department.  He  ruled in for a non-ST segment elevation MI with a troponin that went  from 0.05 to 0.76.  He was transferred to [facility] for further workup.   Cardiac catheterization was performed yesterday by Dr. Lia Foyer and  showed no gradient across his aortic valve.  Left ventricular function  was normal.  He had severe three-vessel coronary artery disease.  The  LAD had a long calcified 90% proximal stenosis.  There was a 95% mid LAD  stenosis after the second diagonal branch.  The second diagonal itself  had about 50% ostial stenosis.  There was a smaller first diagonal  branch that had 95% stenosis.  The left circumflex had a single large  marginal branch  that had about 70-90%  proximal stenosis.  The right  coronary was a large vessel that had 40% proximal stenosis.  There was  about 80% stenosis in a posterolateral branch.  He has remained stable  without chest pain.  He has had some bleeding from his catheterization  site today.   REVIEW OF SYSTEMS:  GENERAL:  He denies any fever or chills. He has had  about a 40 pound weight loss, which he attributes to watching his diet.  He has had fatigue.  EYES:  He wears glasses.  He has had no recent  vision changes.  ENT:  He wears dentures.  ENDOCRINE:  He denies  diabetes and hypothyroidism.  CARDIOVASCULAR:  As above.  He has had  substernal chest pressure and burning; usually after eating.  He has had  some associated shortness of breath with this.  He denies peripheral  edema.  He has had no orthopnea or PND.  RESPIRATORY:  Denies cough and  sputum production.  GI:  He has had no nausea or  vomiting.  Denies  melena and bright red blood per rectum.  GU:  He denies dysuria and  hematuria.  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  NEUROLOGICAL:  He denies any focal weakness or numbness.  He denies  syncope or dizziness.  He did have a possible TIA in October 2004; he  was started on Plavix at that time.   ALLERGIES:  NONE.   MEDICATIONS:  At Admission:  1. Plavix 75 mg daily.  2. Aspirin 81 mg daily.  3. Omeprazole 20 mg daily.  4. Hydrochlorothiazide 25 mg daily.  5. Lisinopril 20 mg b.i.d.  6. Lopressor 25 mg b.i.d.  7. Sublingual nitroglycerin p.r.n.Marland Kitchen   PAST MEDICAL HISTORY:  1. Hypertension.  2. He has history of nonobstructive coronary disease -- status post      catheterization at [facility] around 1996.  3. He has a history of TIA in October 2004, at which time he had      difficulty with his balance.  These symptoms are resolved without      treatment.  He was started on Plavix.  He had a negative workup for      stroke, including negative head CT and  Doppler exam that showed no      significant disease.  4. He is status post 3 hernia surgeries.  5. Status post surgery on his right foot.  6. Status post appendectomy.  7. Status post nose surgery.   SOCIAL HISTORY:  Lives in [place] with his wife.  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  He smokes one pack  of cigarettes per day and has smoked a least one to two packs per day  for 40+ years.  He drinks about a six pack of beer per month.  He denies  any drug abuse.   FAMILY HISTORY:  His father died of myocardial infarction at 86, and his  mother died of stroke at 5.  He has one brother who died of myocardial  infarction at 42 and 2 brothers 83 and 36 who have had coronary bypass  surgery in the past.   PHYSICAL EXAMINATION:  VITAL SIGNS:  Blood pressure 118/65, pulse 70 and  regular, respiratory rate 18 and unlabored.  GENERAL:  He is a well-developed white male in no distress.  HEENT:  Exam shows him to be normocephalic and atraumatic.  Pupils are  equal and reactive to light and accommodation.  Extraocular muscles are  intact.  His throat is clear.  NECK:  Exam shows normal carotid pulses bilaterally.  There were no  bruits.  There was no adenopathy or thyromegaly.  CARDIAC:  Exam shows a regular rate and rhythm with a normal S1-S2.  There was no murmur, rub or gallop.  LUNGS:  Clear.  ABDOMEN:  Exam shows active bowel sounds.  Soft and nontender.  There  were no palpable masses or organomegaly.  EXTREMITIES:  Shows no peripheral edema.  Pedal pulses palpable  bilaterally.  NEUROLOGIC:  Exam shows him to be alert and oriented x3.  Motor and  sensory exams were grossly normal.   DOPPLER EXAMINATION:  Carotid Doppler shows a 40-60% internal carotid  artery stenosis bilaterally, possibly due to mildly tortuous internal  carotid arteries.  Vertebral flow is antegrade bilaterally.  The upper extremity arterial Doppler exam is abnormal on the  right, with the right  palmar arch signal obliterating with radial compression; but remaining  normal with  ulnar compression.  The left side remains normal with ulnar  and radial compression.   LABORATORY EXAMINATION:  Hemoglobin A1c of 5.7.  TSH level of 2.09.  Other laboratory examinations are pending.   ELECTROCARDIOGRAM:  Shows normal sinus rhythm with early repolarization  and minimal voltage for LVH.   IMPRESSION:  Mr. Fullen has severe three-vessel coronary artery disease,  presenting with non-ST segment elevation myocardial infarction.   I agree that coronary artery bypass graft surgery is the best treatment  for this fairly young, active gentleman with severe three-vessel  disease.  He has been on Plavix for several years and has had bleeding  from his groin post catheterization.  I think we should probably wait at  least 5-7 days before proceeding with coronary artery bypass surgery to  decrease bleeding complications.  He has been stable without chest pain.  I have discussed the operative procedure with he and his wife; including  alternatives, benefits, and risks, including but not limited to  bleeding, blood transfusion, infection, stroke, myocardial infarction,  graft failure, and death.  They understand and would like to proceed.   We will tentatively schedule surgery for next Wednesday.      Gilford Raid, M.D.  Electronically Signed     BB/MEDQ  D:  06/08/2006  T:  06/08/2006  Job:  [id number]   cc:   [place]

## 2010-10-01 NOTE — Procedures (Signed)
   NAME:  [name], [name] NO.:  [id number]   MEDICAL RECORD NO.:  [id number]                   PATIENT TYPE:  INP   LOCATION:  A216                                 FACILITY:  [facility]   PHYSICIAN:  Scarlett Presto, M.D.                DATE OF BIRTH:  [birth year]   DATE OF PROCEDURE:  03/14/2003  DATE OF DISCHARGE:                                  ECHOCARDIOGRAM   REFERRING PHYSICIAN:  Sherrilee Gilles. Gerarda Fraction, M.D.   TAPE [id number].  Tape count 4182 through 4573.   HISTORY:  This is a 65 year old guy with chest discomfort and vertigo.  The  technical quality of the study is adequate.   M-MODE TRACINGS:  The aorta is 33 mm.  The left atrium is 48 mm.  The septum is 13 mm.  The left ventricular posterior wall is 11 mm.  The left ventricular systolic dimension is 27 mm.  The left ventricular diastolic dimension is 39 mm.   2-D AND DOPPLER IMAGING:  The left ventricle is normal size with normal  systolic function.  There are no wall motion abnormalities seen.  The diastolic function appears to be mildly impaired.  The overall ejection fraction is estimated at 60%.  There are no wall motion  abnormalities seen.  The right ventricle is normal size with normal systolic function.  No wall  motion abnormalities are seen.  Both atria appear to be slightly enlarged, left greater than right.  There  was no obvious atrial septal defect.  The aortic valve is mildly sclerotic with no stenosis or regurgitation.  The mitral valve is morphologically unremarkable with no stenosis or  regurgitation.  The tricuspid valve is morphologically unremarkable with no stenosis or  regurgitation.  The pulmonic valve is morphologically unremarkable with trace insufficiency,  no stenosis is seen.  The pericardial structures are normal.  The ascending aorta is not well seen.  The inferior vena cava appears to be normal size.      ___________________________________________                                        Scarlett Presto, M.D.   JH/MEDQ  D:  03/14/2003  T:  03/14/2003  Job:  [id number]

## 2010-10-01 NOTE — H&P (Signed)
NAME:  [name], [name] NO.:  [id number]   MEDICAL RECORD NO.:  [id number]          PATIENT TYPE:  INP   LOCATION:  [id number]                         FACILITY:  [facility]   PHYSICIAN:  Carlena Bjornstad, MD, FACCDATE OF BIRTH:  08-07-1945   DATE OF ADMISSION:  06/07/2006  DATE OF DISCHARGE:                              HISTORY & PHYSICAL   BRIEF HISTORY:  [name] is a 65 year old white male who was  transferred from [facility] emergency room to [facility]  secondary to chest discomfort and rapid heart rate.   At approximately 5:30 this morning while walking into work, the patient  developed anterior dull chest aching sensation radiating into his neck  and left arm.  This was associated with diaphoresis.  He denied  shortness of breath, nausea, or vomiting.  He gave it a 6 on a  scale of  0/10.  He sat down and drank some decaffeinated coffee as he attributed  this to his hiatal hernia. However, he had not eaten breakfast. He took  a sublingual nitroglycerin which reduced his discomfort from a 6 to a 3.  When he stood up from the chair, he became very dizzy.  He also noticed  a fast heart beat while resting. He tried to count it but states that he  could not count it because it was going too fast. He denies any loss of  consciousness.   At approximately 7:30, he informed his coworker that he was driving  himself to the emergency room. In the emergency room, he states that he  received various medications, and also the patient described vagal  maneuvers being performed with a carotid massage. His heart rate was  decreased with resolution of his chest discomfort.  We did not actually  have telemetry strips when the heart rate slows down.   The patient states that yesterday he was actually supposed to have a pre-  procedure stress Myoview.  However, this was cancelled secondary to  weather conditions. This was supposed to be done prior to a colonoscopy  that he  needs in the near future.   PAST MEDICAL HISTORY:  No known drug allergies.   MEDICATIONS PRIOR TO ADMISSION:  1. Lasix 75 mg daily.  2. Aspirin 81 mg daily.  3. Nitroglycerin 0.4 p.r.n.  4. Omeprazole 20 mg daily.  5. Hydrochlorothiazide 25 mg daily.  6. Lisinopril 40 mg 1/2 tablet b.i.d.  7. Metoprolol 25 mg b.i.d.   He has a history of hypertension for at least 15 years. By cardiac  [facility], he has nonobstructive coronary artery disease. Records are not  available.  Last stress Myoview was December 25, 2000, with EF of 71%, no  ischemia.  Echocardiogram on March 14, 2003, showed EF of 60%,  bilateral atrial enlargement.  He was hospitalized at [facility] for a  possible TIA in October 2004. Head CTs were negative. Dopplers showed  mild bilateral plaquing. MR showed small vessel ischemic changes which  were felt to be chronic.  He was placed  on aspirin and Plavix at that  time.  His surgical history is notable for an appendectomy,  herniorrhaphy, and nose surgery.  He denies any history of myocardial  infarction, CVA, COPD, diabetes, bleeding dyscrasias or thyroid  disorder.  He feels his cholesterol is probably high, but he does not  know this.   SOCIAL HISTORY:  He resides in [place] with his [name].  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  He continues  to smoke at least one to two packs per day and has been doing so for 40  years.  He states that he may have a six-pack over a month.  Denies any  drug or herbal medications.  He states that he does not watch a specific  diet.  However, recently he has changed to diet soda and has lost 40  pounds over an unspecified duration of time. He denies any specific  exercise.   FAMILY HISTORY:  Mother died at the age of 84 with a myocardial  infarction, his father at the age of 37 with a CVA.  He has one brother  deceased at the age of 69  secondary to myocardial infarction.  He has  two brothers at 44 and 36 that are fairly well. One and had a bypass  surgery.   REVIEW OF SYSTEMS:  In addition to the above, notable for headaches for  the preceding 6 weeks, and the patient states that he has not been  wearing his glasses.  He has left hearing loss and dentures.  He has a  lesion on his right lower back that in the near future he plans to  follow with dermatology.  He also has chronic psoriasis. He snores,  never been evaluated for sleep apnea.  He says his snoring has improved  since his nasal surgery. Arthralgias in his knees. History of hiatal  hernia and GERD.   PHYSICAL EXAMINATION:  GENERAL:  Well-nourished, well-developed,  pleasant white obese male in no apparent distress.  [name] is present.  VITAL SIGNS: Blood pressure initially 148/100, now 135/82. Pulse  initially 131, now 72. Respirations are 16.  HEENT: Unremarkable except for dentures and glasses.  NECK: Supple without thyromegaly, adenopathy, JVD or carotid bruits.  CHEST:  Symmetrical excursion.  Lung sounds are somewhat diminished but  clear to auscultation.  HEART:  PMI is not displaced.  Regular rate and rhythm.  Normal S1-S2. I  do not appreciate any murmurs, rubs, clicks or gallops.  All peripheral  pulses are symmetrical and intact. Do not appreciate any abdominal or  femoral bruits.  SKIN:  Integument is intact, does show lesion in regards to psoriasis,  and it looks like he appears to have a scabbed over mole on his right  lower back.  ABDOMEN:  Obese.  Bowel sounds present without organomegaly, masses or  tenderness.  EXTREMITIES:  Negative cyanosis, clubbing or edema.  MUSCULOSKELETAL/NEUROLOGIC: Grossly unremarkable.   Chest x-ray is pending at the time of this dictation.   EKG at [facility] showed SVT. P-waves were not discernible; however,  narrowed QRS appeared to be regular. Early R wave with a ventricular rate of 131. No old EKGs  available for comparison.  Subsequent EKG  showed normal sinus rhythm with a ventricular rate of 63, early  repolarization.   Hemoglobin 17.4, hematocrit 52.7, normal indices, platelets 259, WBC  10.2. Sodium 135, potassium 4.3, BUN 19, creatinine 1.3, glucose 114.  Point-of-care markers negative x1.  IMPRESSION:  1. Acute coronary syndrome.  2. Supraventricular tachycardia.  3. Multiple cardiac risk factors which include obesity, hypertension,      tobacco use, early family history and probable hyperlipidemia.  4. Questionable history of remote transient ischemic attack.   DISPOSITION:  Dr. Ron Parker reviewed the patient's history, spoke with and  examined the patient, and agrees with the above.  Will admit him to [facility], continue his home medications. We will discontinue the IV  nitroglycerin as the patient is complaining of a headache at this time.  Given his presentation and multiple risk factors, cardiac  catheterization will be performed.  We will try to do this today as  schedule allows.  If this cannot be performed today, it will be  performed tomorrow.  In addition to the usual labs, we will also check  lipids and begin a statin.  Future plans will be guided by the  patient's course.      Sharyl Nimrod, PA-C      Carlena Bjornstad, MD, [facility]  Electronically Signed    EW/MEDQ  D:  06/07/2006  T:  06/07/2006  Job:  [id number]   cc:   Cristopher Estimable. Lattie Haw, MD, [place], [place], [place], [place]

## 2010-10-01 NOTE — Assessment & Plan Note (Signed)
[place] OFFICE NOTE   [name], [name]                       MRN:          [id number]  DATE:09/07/2006                            DOB:          07-21-1945    Mr. Sebastiani returns today for further management of his hypertension.  Please see my note from August 09, 2006.  I added lisinopril at that  time, 20 mg a day.  We increased the metoprolol to 50 mg a day.   Ironically, his blood pressure is always elevated at home, and he does  use a large cuff.  He says it runs around [id number] to 0000000 systolic.  Here, it  usually runs better, and in fact, last visit it was 140/90.  Today it  was 140/68.  Heart rate runs in the 60s.   He is still in cardiac rehab, and is doing with that.   He is working full time now.   He also has a small area that is protruding at the very inferior border  of his midsternal incision.  It is not painful.  It is not persistent,  but comes out when he coughs.   His blood pressure today is 140/68.  His pulse is 68 and regular.  His  weight is 223.  HEENT:  Normocephalic and atraumatic.  PERRLA.  Extraocular movements  intact.  Sclerae are clear.  Facial symmetry is normal.  Carotids are full.  There is no JVD.  Thyroid is not enlarged.  Trachea  is midline.  Midsternal incision is healed.  At the very bottom, there is a small  herniation.  It is reducible.  LUNGS:  Clear to auscultation with reduced breath sounds throughout.  HEART:  Reveals normal S1 and S2 without gallop, rub, or murmur.  ABDOMINAL EXAM:  Otherwise unremarkable.  EXTREMITIES:  Reveal no edema.  Pulses are intact.  NEUROLOGIC:  Exam is intact.   ASSESSMENT:  1. Uncontrolled hypertension.  2. 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   PLAN:  1. Change lisinopril to lisinopril/hydrochlorothiazide 20/25.  2. Continue metoprolol extended release 50 mg a day.  3. Continue Lipitor 80 mg a day.  4.  Continue aspirin 325 a day.  5. Follow up with me in 4 to 6 weeks with blood pressure checks.     Thomas C. Verl Blalock, MD, [facility]  Electronically Signed    TCW/MedQ  DD: 09/07/2006  DT: 09/07/2006  Job #: [id number]   cc:   [place]

## 2010-10-01 NOTE — Op Note (Signed)
NAME:  [name], [name] NO.:  [id number]   MEDICAL RECORD NO.:  [id number]          PATIENT TYPE:  INP   LOCATION:  2312                         FACILITY:  [facility]   PHYSICIAN:  Gilford Raid, M.D.     DATE OF BIRTH:  09/04/1945   DATE OF PROCEDURE:  06/14/2006  DATE OF DISCHARGE:                               OPERATIVE REPORT   PREOPERATIVE DIAGNOSIS:  Severe 3-vessel coronary artery disease.   POSTOPERATIVE DIAGNOSIS:  Severe 3-vessel coronary artery disease.   OPERATIVE PROCEDURES:  Median sternotomy, extracorporeal circulation,  coronary artery bypass graft surgery times 5 using a left internal  mammary artery graft to the left anterior descending coronary artery,  with a saphenous vein graft to the second diagonal branch of the left  anterior descending, a sequential saphenous vein graft to the first  diagonal branch of the left anterior descending and the obtuse marginal  branch of the left circumflex coronary artery, and a saphenous vein  graft to the posterolateral branch of the right coronary artery,  endoscopic vein harvesting from the right leg.   ATTENDING SURGEON:  Gilford Raid, M.D.   ASSISTANT:  Ivin Poot, M.D.   SECOND ASSISTANT:  Richardson Dopp, P.A.-C.   ANESTHESIA:  General endotracheal.   CLINICAL HISTORY:  This patient is a 65 year old gentleman with a  history of hypertension, hyperlipidemia, previous smoking and a positive  family history of heart disease, who was admitted with an acute coronary  syndrome and ruled in for a non-Q-wave myocardial infarction.  His  troponin peaked at 0.76.  Cardiac catheterization was performed and  showed severe 3-vessel disease.  The LAD had a 90% proximal calcified  stenosis between the first and second diagonal branches.  The first  diagonal was small and had a 90% ostial stenosis.  The second diagonal  was a moderate-size vessel that had about 50% ostial stenosis.  The LAD  also had about 50%  stenosis just beyond the diagonal branch, and then a  95% stenosis.  This was a large vessel that coursed around the apex.  The left circumflex gave off a single large marginal branch that had 70  and 90% sequential stenoses proximally.  The right coronary artery was a  large dominant vessel that had mild proximal narrowing, at the most,  40%.  There was a large posterior descending without disease.  There  were 3 small posterolateral branches.  The first had no significant  disease.  The second had about 80% proximal stenosis, and the third had  about 50% proximal stenosis.  Left ventricular function was normal.  The  patient had been on Plavix due to a history of a TIA.  After review of  the angiogram and examination of the patient, it was felt that coronary  artery bypass graft surgery was the best treatment to prevent further  ischemia and infarction.  We felt that we should wait at least 5 days to  allow the Plavix to resolve from his system.  I discussed the operative  procedure of coronary artery bypass graft surgery with the patient  and  his wife.  We discussed alternatives, benefits and risks, including but  not limited to bleeding, blood transfusion, infection, stroke,  myocardial infarction, graft failure, trauma and death.  They understood  and agreed to proceed.   DESCRIPTION OF OPERATIVE PROCEDURES:  The patient was taken to the  operating room and placed on the table in the supine position.  After  induction of general endotracheal anesthesia, a Foley catheter was  placed in the bladder using sterile technique.  Then, the chest, abdomen  and both lower extremities were prepped and draped in the usual sterile  manner.  The chest was entered through a median sternotomy incision and  the pericardium opened in the midline.  Examination of the heart showed  good ventricular contractility.  The ascending aorta had no palpable  plaques in it.   Then, the left internal mammary  artery was harvested from the chest wall  as a pedicle graft.  This was a medium-caliber vessel with excellent  blood flow through it.  At the same time, a segment of greater saphenous  vein was harvested from the right lower extremity using endoscopic vein  harvest technique.  This vein was of medium size and good quality.  We  initially examined the saphenous vein adjacent to the left knee due to  some right groin hematoma.  Unfortunately, the vein in the left leg was  small and branching at the knee level, and not felt to be ideal.   Then, the patient was heparinized and when adequate activated clotting  time was achieved, the distal ascending aorta was cannulated using a 20-  [place] aortic cannula for arterial inflow.  Venous outflow was achieved  using a 2-stage venous cannula through the right atrial appendage.  An  antegrade cardioplegia and vent cannula was inserted in the aortic root.   The patient was placed on cardiopulmonary bypass and the distal  coronaries identified.  The LAD was diffusely diseased throughout its  proximal and midportions.  There was some segmental plaque in the distal  vessel, extending out to the apex.  The first diagonal was small, but  graftable.  The second diagonal was a moderate-size branching vessel  that was diffusely diseased.  The obtuse marginal was a large graftable  vessel with minimal distal disease in it.  The right coronary artery had  mild segmental proximal plaque.  The remainder of the vessel appeared  free of disease.  There was some palpable plaque present at the takeoff  of the posterolateral branches, corresponding to what was seen on the  angiogram.  The first posterolateral branch was a very small vessel that  was not felt to be graftable.  The second posterolateral branch was  suitable for grafting.   Then, the aorta was crossclamped and 800 mL of cold blood antegrade cardioplegia was administered in the aortic root with quick  arrest of  the heart.  Systemic hypothermia to 28 degrees Centigrade and topical  hypothermia with iced saline were used.  A temperature probe was placed  on the septum, and an insulating pad on the pericardium.   Then, the first distal anastomosis was performed to the first diagonal  branch.  The internal diameter was about 1.5 mm.  The conduit used was a  segment of greater saphenous vein.  The anastomosis was performed in a  sequential, side-to-side manner using continuous 7-0 Prolene suture.  Flow was measured through the graft and was excellent.   The second distal anastomosis  was performed to the obtuse marginal  branch.  The internal diameter was about 2 mm.  The conduit used was the  same segment of greater saphenous vein.  The anastomosis was performed  in a sequential, end-to-side manner using continuous 7-0 Prolene suture.  Flow was measured through the graft and was excellent.  Then, a dose of  cardioplegia was given down the vein graft and the aortic root.   The third distal anastomosis was performed to the posterolateral branch  of the right coronary artery.  The internal diameter was about 1.5 mm.  The conduit used was a second segment of greater saphenous vein, and the  anastomosis performed in an end-to-side manner using continuous 8-0  Prolene suture.  Flow was measured through the graft and was excellent.   The fourth distal anastomosis was performed to the second diagonal  branch.  The internal diameter was about 1.6 mm.  The conduit used was a  third segment of greater saphenous vein, and the anastomosis performed  in an end-to-side manner using continuous 7-0 Prolene suture.  Flow was  measured through the graft and was excellent.   The patient was then given antegrade cardioplegia down both vein grafts  and in the aortic root.  The fifth distal anastomosis was performed to  the distal LAD.  The internal diameter of this vessel was about 1.75 mm.  The conduit  used was the left internal mammary graft, and this was  brought through an opening in the left pericardium anterior to the  phrenic nerve.  It was anastomosed to the LAD in end-to-side manner  using continuous 8-0 Prolene suture.  The pedicle was sutured to the  epicardium with 6-0 Prolene sutures.   Then, the 3 proximal vein graft anastomoses were performed to the aortic  root in an end-to-side manner using continuous 6-0 Prolene suture.  The  clamp was then removed from the mammary pedicle.  There was rapid  warming of the ventricular septum and return of spontaneous ventricular  fibrillation.  The crossclamp was removed at a time of 84 minutes, and  the patient spontaneously converted to sinus rhythm.  The proximal and  distal anastomoses appeared hemostatic and flow through the grafts  satisfactory.  Graft markers were placed around the proximal  anastomoses.  Two temporary right ventricular and right atrial pacing wires were placed and brought out through the skin.   With the patient then rewarmed to 37 degrees Centigrade, he was weaned  from cardiopulmonary bypass on no inotropic agents.  Total bypass time  was 103 minutes.  Cardiac function appeared excellent with a cardiac  output of 4.5 L per minute.  Protamine was given and the venous and  aortic cannulae were removed without difficulty.  Hemostasis was  achieved.  No blood products were given.  Three chest tubes were placed  with a tube in the posterior pericardium, one in the anterior  mediastinum, and one in the left pleural space.  The pericardium was  loosely reapproximated over the heart.  The sternum was closed with #6  stainless steel wires.  The fascia was closed with continuous #1 Vicryl  suture.  The subcutaneous tissue was closed with continuous 2-0 Vicryl,  and the skin with a 3-0 Vicryl subcuticular closure.  The lower  extremity vein harvest site was closed in layers in a similar manner.  The sponge, needle and  instrument counts were correct according to the  scrub nurse.  Dry sterile dressings were applied  over the incisions and  around the chest tubes, which were hooked to Pleur-evac suction.  The  patient remained hemodynamically stable and was transported to the SICU  in guarded, but stable condition.      Gilford Raid, M.D.  Electronically Signed     BB/MEDQ  D:  06/14/2006  T:  06/15/2006  Job:  [id number]   cc:   [facility]  Cardiac Cath Lab, [name]

## 2010-10-01 NOTE — H&P (Signed)
NAME:  [name], HEEKIN NO.:  [id number]   MEDICAL RECORD NO.:  [id number]                   PATIENT TYPE:  INP   LOCATION:  A216                                 FACILITY:  [facility]   PHYSICIAN:  Sherrilee Gilles. Gerarda Fraction, M.D.             DATE OF BIRTH:  08-Aug-1945   DATE OF ADMISSION:  03/13/2003  DATE OF DISCHARGE:                                HISTORY & PHYSICAL   CHIEF COMPLAINT:  Chest pain and dizziness.   HISTORY OF PRESENT ILLNESS:  The patient felt fine, went to work, worked  most of the day, and while ambulating developed a sudden severe onset of  severe vertigo.  He describes an off balance sensation, difficulty standing  without supporting himself.  He admits to a very mild headache shortly after  the onset of this;  however, the duration of this vertigo which was slightly  worse with movement of the head was about 40 minutes.  He states that he had  some visual changes as well describing triple vision.  When he covered his  left eye he could clear up and focus a little bit better, although there  might have been some visual deficit that he could not clearly describe to  me.  He developed a wave of nausea at the same time, but the vertigo, as  well as a cold diaphoretic spell.  After sitting down with some diminishing  of the vertigo, he developed some substernal chest pressure which he stated  was not severe.  He also developed some left arm radiation of the pain.  This lasted a matter of 15 or 20 minutes.  He was brought to the emergency  department for evaluation of all of these symptoms, and he states they all  gradually cleared, and he is pretty much back to normal at present.  He was  able to ambulate in the emergency room from the triage area backwards  without difficulty or assistance.  He denied any headache or chest pain at  the present time.   PAST MEDICAL HISTORY:  1. Hypertension.  2. Status post herniorrhaphy.   SOCIAL HISTORY:   Positive for cigarette smoking, one pack per day.  No  alcohol or drug use.  He is currently seen at the [facility] for most of his health  care, and they have adjusted his hypertension medication with good results  according to the patient.   FAMILY HISTORY:  Strongly positive.  His father died of a stroke at age 78.  Mother deceased of myocardial infarction at age 12.  Brother in his 38s who  had a stroke.   REVIEW OF SYSTEMS:  Under HPI, all else is negative.   PHYSICAL EXAMINATION:  GENERAL:  He is awake, alert, cooperative, pleasant  gentleman.  HEENT AND NECK:  No JVD or adenopathy.  Neck was supple.  CHEST:  Clear.  CARDIAC:  Regular.  There  is no murmur, gallop, or rub.  There are no  carotid bruits.  ABDOMEN:  Soft, no organomegaly or masses.  EXTREMITIES:  Without cyanosis, clubbing, or edema.  NEUROLOGIC:  Cranial nerve function II-XII was normal.  Motor and sensory  examination normal.   LABORATORY DATA:  Normal H&H and white count.  Platelet count was normal.  Electrolytes were unremarkable.  Renal function and calcium normal.  EKG  within normal limits, normal sinus rhythm.   IMPRESSION:  1. The patient appears to of had a brain stem transient ischemic attack     which resolved.  This was also followed by what could be some unstable     angina.  With the transient ischemic attack, he is already taking aspirin     at home.  We will add Plavix here pending carotid Dopplers,     echocardiogram, and CAT scan which is pending at present time, but will     be reviewed when available.  2. Chest pain.  Rule out myocardial infarction protocol, serial cardiac     enzymes, and EKGs.  He may need catheterization versus a Cardiolite     stress test.  Cardiology consultation will be sought.  3. Hypertension.  Monitor for any changes.  Continue home medication regimen     for now.  4. Smoking.  Tobacco cessation will be done in the hospital.      ___________________________________________                                         Sherrilee Gilles. Gerarda Fraction, M.D.   LJF/MEDQ  D:  03/13/2003  T:  03/13/2003  Job:  [id number]

## 2010-12-14 ENCOUNTER — Encounter: Payer: Self-pay | Admitting: Cardiology

## 2011-01-18 ENCOUNTER — Ambulatory Visit: Payer: Self-pay | Admitting: Cardiology

## 2011-02-17 ENCOUNTER — Encounter: Payer: Self-pay | Admitting: Cardiology

## 2014-01-30 ENCOUNTER — Emergency Department (HOSPITAL_COMMUNITY): Payer: Medicare Other

## 2014-01-30 ENCOUNTER — Inpatient Hospital Stay (HOSPITAL_COMMUNITY)
Admission: EM | Admit: 2014-01-30 | Discharge: 2014-02-01 | DRG: 247 | Disposition: A | Discharge status: Home / Self Care | Payer: Medicare Other | Attending: Cardiology | Admitting: Cardiology

## 2014-01-30 ENCOUNTER — Encounter (HOSPITAL_COMMUNITY): Payer: Self-pay | Admitting: Emergency Medicine

## 2014-01-30 DIAGNOSIS — Z7902 Long term (current) use of antithrombotics/antiplatelets: Secondary | ICD-10-CM | POA: Diagnosis not present

## 2014-01-30 DIAGNOSIS — I2510 Atherosclerotic heart disease of native coronary artery without angina pectoris: Secondary | ICD-10-CM

## 2014-01-30 DIAGNOSIS — R0789 Other chest pain: Secondary | ICD-10-CM

## 2014-01-30 DIAGNOSIS — F17200 Nicotine dependence, unspecified, uncomplicated: Secondary | ICD-10-CM

## 2014-01-30 DIAGNOSIS — I2582 Chronic total occlusion of coronary artery: Secondary | ICD-10-CM | POA: Diagnosis present

## 2014-01-30 DIAGNOSIS — E663 Overweight: Secondary | ICD-10-CM

## 2014-01-30 DIAGNOSIS — Z7982 Long term (current) use of aspirin: Secondary | ICD-10-CM | POA: Diagnosis not present

## 2014-01-30 DIAGNOSIS — I25810 Atherosclerosis of coronary artery bypass graft(s) without angina pectoris: Secondary | ICD-10-CM | POA: Diagnosis present

## 2014-01-30 DIAGNOSIS — R7989 Other specified abnormal findings of blood chemistry: Secondary | ICD-10-CM

## 2014-01-30 DIAGNOSIS — E785 Hyperlipidemia, unspecified: Secondary | ICD-10-CM | POA: Diagnosis present

## 2014-01-30 DIAGNOSIS — I200 Unstable angina: Secondary | ICD-10-CM

## 2014-01-30 DIAGNOSIS — Z888 Allergy status to other drugs, medicaments and biological substances status: Secondary | ICD-10-CM | POA: Diagnosis not present

## 2014-01-30 DIAGNOSIS — I10 Essential (primary) hypertension: Secondary | ICD-10-CM

## 2014-01-30 DIAGNOSIS — E782 Mixed hyperlipidemia: Secondary | ICD-10-CM

## 2014-01-30 DIAGNOSIS — R079 Chest pain, unspecified: Secondary | ICD-10-CM | POA: Diagnosis present

## 2014-01-30 DIAGNOSIS — I2119 ST elevation (STEMI) myocardial infarction involving other coronary artery of inferior wall: Secondary | ICD-10-CM

## 2014-01-30 DIAGNOSIS — Z8249 Family history of ischemic heart disease and other diseases of the circulatory system: Secondary | ICD-10-CM | POA: Diagnosis not present

## 2014-01-30 DIAGNOSIS — I214 Non-ST elevation (NSTEMI) myocardial infarction: Secondary | ICD-10-CM | POA: Diagnosis present

## 2014-01-30 DIAGNOSIS — Z79899 Other long term (current) drug therapy: Secondary | ICD-10-CM

## 2014-01-30 DIAGNOSIS — F411 Generalized anxiety disorder: Secondary | ICD-10-CM | POA: Diagnosis not present

## 2014-01-30 DIAGNOSIS — I25119 Atherosclerotic heart disease of native coronary artery with unspecified angina pectoris: Secondary | ICD-10-CM

## 2014-01-30 HISTORY — DX: ST elevation (STEMI) myocardial infarction involving other coronary artery of inferior wall: I21.19

## 2014-01-30 LAB — TROPONIN I: Troponin I: <0.3 ng/mL (ref <0.30)

## 2014-01-30 LAB — CBC WITH DIFFERENTIAL/PLATELET
BASOS PCT: 1 % (ref 0–1)
Basophils Absolute: 0 10*3/uL (ref 0.0–0.1)
EOS ABS: 0.1 10*3/uL (ref 0.0–0.7)
Eosinophils Relative: 2 % (ref 0–5)
HEMATOCRIT: 46.6 % (ref 39.0–52.0)
HEMOGLOBIN: 16.1 g/dL (ref 13.0–17.0)
Lymphocytes Relative: 32 % (ref 12–46)
Lymphs Abs: 2.5 10*3/uL (ref 0.7–4.0)
MCH: 30.4 pg (ref 26.0–34.0)
MCHC: 34.5 g/dL (ref 30.0–36.0)
MCV: 87.9 fL (ref 78.0–100.0)
MONO ABS: 0.6 10*3/uL (ref 0.1–1.0)
MONOS PCT: 7 % (ref 3–12)
Neutro Abs: 4.6 10*3/uL (ref 1.7–7.7)
Neutrophils Relative %: 58 % (ref 43–77)
Platelets: 210 10*3/uL (ref 150–400)
RBC: 5.3 MIL/uL (ref 4.22–5.81)
RDW: 14.1 % (ref 11.5–15.5)
WBC: 7.9 10*3/uL (ref 4.0–10.5)

## 2014-01-30 LAB — BASIC METABOLIC PANEL
Anion gap: 12 (ref 5–15)
BUN: 26 mg/dL — AB (ref 6–23)
CO2: 28 mEq/L (ref 19–32)
Calcium: 9.8 mg/dL (ref 8.4–10.5)
Chloride: 99 mEq/L (ref 96–112)
Creatinine, Ser: 1.42 mg/dL — ABNORMAL HIGH (ref 0.50–1.35)
GFR, EST AFRICAN AMERICAN: 58 mL/min — AB (ref >90)
GFR, EST NON AFRICAN AMERICAN: 50 mL/min — AB (ref >90)
Glucose, Bld: 95 mg/dL (ref 70–99)
Potassium: 4.1 mEq/L (ref 3.7–5.3)
Sodium: 139 mEq/L (ref 137–147)

## 2014-01-30 LAB — PROTIME-INR
INR: 1.02 (ref 0.00–1.49)
Prothrombin Time: 13.4 seconds (ref 11.6–15.2)

## 2014-01-30 MED ORDER — ASPIRIN EC 81 MG PO TBEC: 81.0000 mg | DELAYED_RELEASE_TABLET | Freq: Every day | ORAL | Status: DC

## 2014-01-30 MED ORDER — ASPIRIN EC 81 MG PO TBEC
81.0000 mg | DELAYED_RELEASE_TABLET | Freq: Every day | ORAL | Status: DC
  Administered 2014-01-31: 81 mg via ORAL
  Filled 2014-01-30 (×2): qty 1

## 2014-01-30 MED ORDER — ATORVASTATIN CALCIUM 80 MG PO TABS
80.0000 mg | ORAL_TABLET | Freq: Every day | ORAL | Status: DC
  Filled 2014-01-30 (×3): qty 1

## 2014-01-30 MED ORDER — ACETAMINOPHEN 325 MG PO TABS: 650.0000 mg | ORAL_TABLET | ORAL | Status: DC | PRN

## 2014-01-30 MED ORDER — OMEGA-3-ACID ETHYL ESTERS 1 G PO CAPS
1.0000 g | ORAL_CAPSULE | Freq: Two times a day (BID) | ORAL | Status: DC
  Administered 2014-01-31 – 2014-02-01 (×3): 1 g via ORAL
  Filled 2014-01-30 (×6): qty 1

## 2014-01-30 MED ORDER — NITROGLYCERIN 0.4 MG SL SUBL: 0.4000 mg | SUBLINGUAL_TABLET | SUBLINGUAL | Status: DC | PRN

## 2014-01-30 MED ORDER — HEPARIN BOLUS VIA INFUSION
4000.0000 [IU] | Freq: Once | INTRAVENOUS | Status: AC
  Administered 2014-01-30: 4000 [IU] via INTRAVENOUS

## 2014-01-30 MED ORDER — METOPROLOL SUCCINATE ER 100 MG PO TB24
100.0000 mg | ORAL_TABLET | Freq: Every day | ORAL | Status: DC
  Administered 2014-01-31 – 2014-02-01 (×2): 100 mg via ORAL
  Filled 2014-01-30 (×3): qty 1

## 2014-01-30 MED ORDER — SODIUM CHLORIDE 0.9 % IV SOLN: INTRAVENOUS | Status: DC

## 2014-01-30 MED ORDER — NITROGLYCERIN IN D5W 200-5 MCG/ML-% IV SOLN
2.0000 ug/min | INTRAVENOUS | Status: DC
  Administered 2014-01-30: 5 ug/min via INTRAVENOUS
  Filled 2014-01-30: qty 250

## 2014-01-30 MED ORDER — HEPARIN (PORCINE) IN NACL 100-0.45 UNIT/ML-% IJ SOLN
1500.0000 [IU]/h | INTRAMUSCULAR | Status: DC
  Administered 2014-01-30: 1300 [IU]/h via INTRAVENOUS
  Filled 2014-01-30 (×3): qty 250

## 2014-01-30 MED ORDER — ONDANSETRON HCL 4 MG/2ML IJ SOLN: 4.0000 mg | Freq: Four times a day (QID) | INTRAMUSCULAR | Status: DC | PRN

## 2014-01-30 MED ORDER — PANTOPRAZOLE SODIUM 40 MG PO TBEC
40.0000 mg | DELAYED_RELEASE_TABLET | Freq: Every day | ORAL | Status: DC
  Administered 2014-01-31 – 2014-02-01 (×2): 40 mg via ORAL
  Filled 2014-01-30 (×2): qty 1

## 2014-01-30 MED ORDER — AMLODIPINE BESYLATE 10 MG PO TABS
10.0000 mg | ORAL_TABLET | Freq: Every day | ORAL | Status: DC
  Administered 2014-01-31 – 2014-02-01 (×2): 10 mg via ORAL
  Filled 2014-01-30 (×3): qty 1

## 2014-01-30 NOTE — ED Notes (Addendum)
Patient states he was working outside and had sudden onset of mid sternal chest pain that radiated to bilateral neck and bilateral jaws with shortness of breath and sweating. Patient states he took 324mg  ASA. Patient has no complaints of pain at this time. Patient is A&OX4. Patient has history of bypassX5 with out placement

## 2014-01-30 NOTE — Progress Notes (Signed)
ANTICOAGULATION CONSULT NOTE - Initial Consult  Pharmacy Consult for Heparin Indication: chest pain/ACS  Allergies  Allergen Reactions  . Cardizem Cd [Diltiazem Hcl Er Beads] Other (See Comments)    Makes heart skip    Patient Measurements: Height: 5' 9.5" (176.5 cm) Weight: 225 lb (102.059 kg) IBW/kg (Calculated) : 71.85 Heparin Dosing Weight: 93.4 kg  Vital Signs: Temp: 97.7 F (36.5 C) (09/17 2057) Temp src: Oral (09/17 2057) BP: 155/52 mmHg (09/17 2057) Pulse Rate: 60 (09/17 2057)  Labs:  Recent Labs  01/30/14 2020  HGB 16.1  HCT 46.6  PLT 210  LABPROT 13.4  INR 1.02  CREATININE 1.42*  TROPONINI <0.30    Estimated Creatinine Clearance: 60 ml/min (by C-G formula based on Cr of 1.42).   Medical History: Past Medical History  Diagnosis Date  . Coronary artery disease     Artery bypass graft Jan 2008  . Hyperlipidemia   . Hypertension   . Tobacco user     Medications:  Scheduled:    Assessment: New onset unstable angina. Past history CABG Labs reviewed, PTA medication reviewed Patient to be transferred to [facility]  Goal of Therapy:  Heparin level 0.3-0.7 units/ml Anti-Xa level 0.6-1 units/ml 4hrs after LMWH dose given Monitor platelets by anticoagulation protocol: Yes   Plan:  Give 4000 units bolus x 1 Start heparin infusion at 1300 units/hr Check anti-Xa level in 6 hours and daily while on heparin Continue to monitor H&H and platelets  Abner Greenspan, Zayda Angell Bennett 01/30/2014,9:16 PM

## 2014-01-30 NOTE — H&P (Signed)
Patient ID: [name] MRN: [id number], DOB/AGE: [birth year]   Admit date: 01/30/2014   Primary Physician: Glo Herring., MD Primary Cardiologist: None  CC: Chest pain  Problem List  Past Medical History  Diagnosis Date  . Coronary artery disease     Artery bypass graft Jan 2008  . Hyperlipidemia   . Hypertension   . Tobacco user     Past Surgical History  Procedure Laterality Date  . Coronary artery bypass graft  06/14/2006    x 5  . Appendectomy    . Hernia repair    . Foot surgery    . Nose surgery       Allergies  Allergies  Allergen Reactions  . Cardizem Cd [Diltiazem Hcl Er Beads] Other (See Comments)    Makes heart skip    HPI  The patient is a 68M with a history of CAD s/p CABG (LIMA-LAD, VG-D2, VG-D1-OM, VG-RPL) in 2008 presenting with new onset chest pain x 1 day. He reports that he was dragging a hose across his yard and reports sudden onset chest pressure that lasted at least 30 minutes. He notes that he had never had such severe chest pain; even when he had his CABG previously he did not present with this degree of chest discomfort.   His pain self-resolved after rest and he reported to the [facility] for additional management. At [facility], he was chest pain free and his first set of cardiac biomarkers was negative. Given his PMH and presentation he was transferred here for additional management.  When he arrived on the floor he complained of 5/10 CP. SBP was in the 190s.   Home Medications  Prior to Admission medications   Medication Sig Start Date End Date Taking? Authorizing Provider  amLODipine (NORVASC) 10 MG tablet Take 10 mg by mouth daily.   Yes Historical Provider, MD  aspirin 325 MG EC tablet Take 325 mg by mouth daily.     Yes Historical Provider, MD  hydrochlorothiazide (HYDRODIURIL) 25 MG tablet Take 25 mg by mouth daily.   Yes Historical Provider, MD  lisinopril (PRINIVIL,ZESTRIL) 40 MG tablet Take 40 mg by mouth daily.   Yes  Historical Provider, MD  metoprolol succinate (TOPROL-XL) 100 MG 24 hr tablet Take 100 mg by mouth daily. Take with or immediately following a meal.   Yes Historical Provider, MD  nitroGLYCERIN (NITROSTAT) 0.4 MG SL tablet Place 0.4 mg under the tongue every 5 (five) minutes as needed for chest pain.    Yes Historical Provider, MD  Omega-3 Fatty Acids (RA FISH OIL) 1000 MG CAPS Take 1 capsule by mouth 2 (two) times daily.     Yes Historical Provider, MD  omeprazole (PRILOSEC) 20 MG capsule Take 40 mg by mouth daily.    Yes Historical Provider, MD  pravastatin (PRAVACHOL) 40 MG tablet Take 40 mg by mouth daily.   Yes Historical Provider, MD    Family History  Family History  Problem Relation Age of Onset  . Heart attack Mother   . Heart attack Brother     Social History  History   Social History  . Marital Status: Married    Spouse Name: N/A    Number of Children: N/A  . Years of Education: N/A   Occupational History  . Not on file.   Social History Main Topics  . Smoking status: Current Every Day Smoker -- 2.00 packs/day for 40 years    Types: Cigarettes  . Smokeless  tobacco: Not on file  . Alcohol Use: No  . Drug Use: No  . Sexual Activity: Not on file   Other Topics Concern  . Not on file   Social History Narrative  . No narrative on file     Review of Systems General:  No chills, fever, night sweats or weight changes.  Cardiovascular: +CP/SOB, No dyspnea on exertion, edema, orthopnea, palpitations, paroxysmal nocturnal dyspnea. Dermatological: No rash, lesions/masses Respiratory: No cough, dyspnea Urologic: No hematuria, dysuria Abdominal:   No nausea, vomiting, diarrhea, bright red blood per rectum, melena, or hematemesis Neurologic:  No visual changes, wkns, changes in mental status. All other systems reviewed and are otherwise negative except as noted above.  Physical Exam  Blood pressure 191/64, pulse 58, temperature 97.8 F (36.6 C), temperature source  Oral, resp. rate 18, height 5' 9.5" (1.765 m), weight 228 lb 11.2 oz (103.738 kg), SpO2 96.00%.  General: Pleasant, NAD Psych: Normal affect. Neuro: Alert and oriented X 3. Moves all extremities spontaneously. HEENT: Normal  Neck: Supple without bruits or JVD. Lungs:  Resp regular and unlabored, CTA. Heart: RRR no s3, s4, or murmurs. Abdomen: Soft, non-tender, non-distended, BS + x 4.  Extremities: No clubbing, cyanosis or edema. DP/PT/Radials 2+ and equal bilaterally.  Labs  Recent Labs  01/30/14 2020  TROPONINI <0.30   Lab Results  Component Value Date   WBC 7.9 01/30/2014   HGB 16.1 01/30/2014   HCT 46.6 01/30/2014   MCV 87.9 01/30/2014   PLT 210 01/30/2014    Recent Labs Lab 01/30/14 2020  NA 139  K 4.1  CL 99  CO2 28  BUN 26*  CREATININE 1.42*  CALCIUM 9.8  GLUCOSE 95   Lab Results  Component Value Date   CHOL 168 02/10/2010   HDL 26.40* 02/10/2010   LDLCALC 118* 04/23/2008   TRIG 348.0* 02/10/2010   No results found for this basename: DDIMER    Radiology/Studies  Dg Chest Port 1 View  01/30/2014   CLINICAL DATA:  Chest pain.  History of smoking.  EXAM: PORTABLE CHEST - 1 VIEW  COMPARISON:  Chest radiograph performed 06/16/2006  FINDINGS: The lungs are well-aerated. Mild vascular congestion is noted. There is no evidence of pleural effusion or pneumothorax.  The cardiomediastinal silhouette is within normal in size. The patient is status post median sternotomy. No acute osseous abnormalities are seen.  IMPRESSION: Mild vascular congestion noted; lungs remain grossly clear.   Electronically Signed   By: Garald Balding M.D.   On: 01/30/2014 21:21    ECG NSR @60bpm , nl axis, intervals. 0.31mm STE inferiorly though he has had this on a previous ECG.   ASSESSMENT AND PLAN  68M with a history of CAD s/p CABG in 2008 now new onset chest discomfort, concerning for acute coronary syndrome.  1. ACS: He has a concerning presentation for ACS and persistent chest pain on  presentation to [facility]. At this point, we will manage his symptoms and continue to cycle his biomarkers. Would favor cardiac cath in AM to delineate coronary anatomy and assess patency of his bypass grafts. -ASA 81 -Defer P2Y12 for now -continue BB, holding ACEi for mild renal dysfunction -start atorvastatin 80mg  daily -check lipids -start heparin for goal PTT 50-70 -start nitro gtt for BP control and symptom management. -NPO pMN for possible cath in AM  2. Elevated creatinine- will manage conservatively for now with hydration; may need more comprehensive workup, though his last Cr was 1.5 -NS@100cc /hr x 1L  FULL  CODE  Signed, Raliegh Ip, MD MPH 01/30/2014, 11:06 PM

## 2014-01-30 NOTE — ED Provider Notes (Addendum)
CSN: [id number]     Arrival date & time 01/30/14  1942 History  This chart was scribed for Mark Furry, MD by Edison Simon, ED Scribe. This patient was seen in room APA07/APA07 and the patient's care was started at 8:18 PM.    Chief Complaint  Patient presents with  . Chest Pain   The history is provided by the patient. No language interpreter was used.   HPI Comments: [name] is a 68 y.o. male who presents to the Emergency Department complaining of substernal chest pressure and  pain radiating up his neck to his jaw and down his arms with onset at 7PM, approximately 1 hours and 20 minutes ago. Started while walking/dragging a hose out to his yard, then walking back to undo the hose from the nozzle. Pain started, he became "soaked" with sweat.He states the pain lasted 30 minutes, resolved after sitting down and taking ASA, and states he is experiencing no pain at this time. He reports associated diaphoresis and shortness of breath. He reports it occurred while moving a hose in his yard and denies any other exertion today. He states today's pain was totally different and worse than any previous pain; e reports previous episodes of slight pain and tachycardia. He reports a CABG 9 years ago; per chart, actually 2008, at [facility].  he states he has had 5 bypasses in 3 vessels. He states he sees the cardiologist at the [place] but states he has not had any testing in 3 years. He reports back and groin pain with onset a few weeks ago and recent imaging results that show calcification in his femoral artery. He denies dizzy, palpitations, nausea, abdominal pain, weakness, indigestion, or leg swelling.  Past Medical History  Diagnosis Date  . Coronary artery disease     Artery bypass graft Jan 2008  . Hyperlipidemia   . Hypertension   . Tobacco user    Past Surgical History  Procedure Laterality Date  . Coronary artery bypass graft  06/14/2006    x 5  . Appendectomy    . Hernia repair    . Foot  surgery    . Nose surgery     Family History  Problem Relation Age of Onset  . Heart attack Mother   . Heart attack Brother    History  Substance Use Topics  . Smoking status: Current Every Day Smoker -- 2.00 packs/day for 40 years    Types: Cigarettes  . Smokeless tobacco: Not on file  . Alcohol Use: No    Review of Systems  Constitutional: Positive for diaphoresis. Negative for fever, chills, appetite change and fatigue.  HENT: Negative for mouth sores, sore throat and trouble swallowing.   Eyes: Negative for visual disturbance.  Respiratory: Positive for shortness of breath. Negative for cough, chest tightness and wheezing.   Cardiovascular: Positive for chest pain. Negative for palpitations and leg swelling.  Gastrointestinal: Negative for nausea, vomiting, abdominal pain, diarrhea and abdominal distention.  Endocrine: Negative for polydipsia, polyphagia and polyuria.  Genitourinary: Negative for dysuria, frequency and hematuria.  Musculoskeletal: Negative for gait problem.  Skin: Negative for color change, pallor and rash.  Neurological: Negative for dizziness, syncope, weakness, light-headedness and headaches.  Hematological: Does not bruise/bleed easily.  Psychiatric/Behavioral: Negative for behavioral problems and confusion.      Allergies  Cardizem cd  Home Medications   Prior to Admission medications   Medication Sig Start Date End Date Taking? Authorizing Provider  amLODipine (NORVASC) 10 MG  tablet Take 10 mg by mouth daily.   Yes Historical Provider, MD  aspirin 325 MG EC tablet Take 325 mg by mouth daily.     Yes Historical Provider, MD  hydrochlorothiazide (HYDRODIURIL) 25 MG tablet Take 25 mg by mouth daily.   Yes Historical Provider, MD  lisinopril (PRINIVIL,ZESTRIL) 40 MG tablet Take 40 mg by mouth daily.   Yes Historical Provider, MD  metoprolol succinate (TOPROL-XL) 100 MG 24 hr tablet Take 100 mg by mouth daily. Take with or immediately following a  meal.   Yes Historical Provider, MD  nitroGLYCERIN (NITROSTAT) 0.4 MG SL tablet Place 0.4 mg under the tongue every 5 (five) minutes as needed for chest pain.    Yes Historical Provider, MD  Omega-3 Fatty Acids (RA FISH OIL) 1000 MG CAPS Take 1 capsule by mouth 2 (two) times daily.     Yes Historical Provider, MD  omeprazole (PRILOSEC) 20 MG capsule Take 40 mg by mouth daily.    Yes Historical Provider, MD  pravastatin (PRAVACHOL) 40 MG tablet Take 40 mg by mouth daily.   Yes Historical Provider, MD   BP 155/52  Pulse 60  Temp(Src) 97.7 F (36.5 C) (Oral)  Resp 17  Ht 5' 9.5" (1.765 m)  Wt 225 lb (102.059 kg)  BMI 32.76 kg/m2  SpO2 95% Physical Exam  Nursing note and vitals reviewed. Constitutional: He is oriented to person, place, and time. He appears well-developed and well-nourished. No distress.  HENT:  Head: Normocephalic.  Eyes: Conjunctivae are normal. Pupils are equal, round, and reactive to light. No scleral icterus.  Neck: Normal range of motion. Neck supple. No thyromegaly present.  Cardiovascular: Normal rate and regular rhythm.  Exam reveals no gallop and no friction rub.   No murmur heard. Pulmonary/Chest: Effort normal and breath sounds normal. No respiratory distress. He has no wheezes. He has no rales. He exhibits no tenderness.  Abdominal: Soft. Bowel sounds are normal. He exhibits no distension. There is no tenderness. There is no rebound.  abd and groin without tenderness or pulsations  Musculoskeletal: Normal range of motion. He exhibits no edema.  Slight tenderness to left SI joint  Neurological: He is alert and oriented to person, place, and time.  Skin: Skin is warm and dry. No rash noted.  Psychiatric: He has a normal mood and affect. His behavior is normal.    ED Course  CRITICAL CARE Performed by: Leandre, [place] by: [name] Total critical care time: 45 minutes Critical care start time: 01/30/2014 8:29 PM Critical care end time: 01/30/2014  9:14 PM Critical care time was exclusive of separately billable procedures and treating other patients. Critical care was necessary to treat or prevent imminent or life-threatening deterioration of the following conditions: Unstable angina. Critical care was time spent personally by me on the following activities: blood draw for specimens, development of treatment plan with patient or surrogate, discussions with consultants, interpretation of cardiac output measurements, evaluation of patient's response to treatment, examination of patient, obtaining history from patient or surrogate, ordering and performing treatments and interventions, ordering and review of laboratory studies, ordering and review of radiographic studies, pulse oximetry, re-evaluation of patient's condition and review of old charts.   (including critical care time) Labs Review Labs Reviewed  BASIC METABOLIC PANEL - Abnormal; Notable for the following:    BUN 26 (*)    Creatinine, Ser 1.42 (*)    GFR calc non Af Amer 50 (*)    GFR calc Af Amer 58 (*)  All other components within normal limits  CBC WITH DIFFERENTIAL  TROPONIN I  PROTIME-INR    Imaging Review No results found.   EKG Interpretation None     DIAGNOSTIC STUDIES: Oxygen Saturation is 95% on room air, adequate by my interpretation.    COORDINATION OF CARE: 8:30 PM-Discussed with patient at beside the treatment plan which includes blood tests, applying Nitropaste to his chest, and consulting with a cardiologist, the patient agrees with the plan and has no further questions at this time.  EKG:  SR. No st or T changes.  No ectopy.   MDM   Final diagnoses:  Unstable angina    Patient's symptoms sound consistent with new-onset unstable angina. He states it is similar but much more severe than the angina he recalls prior to his bypass.  EKG shows no ST or T wave changes. Initial troponin normal. He remained symptom-free. He took 325 aspirin home. Given  heparin bolus and drip. I discussed case with Dr.Vora. He is on call for cardiology [facility]. Arrangements made for the patient be transferred and admitted to a telemetry bed [place] for probable angiogram tomorrow.  I personally performed the services described in this documentation, which was scribed in my presence. The recorded information has been reviewed and is accurate.    Mark Furry, MD 01/30/14 2114  Mark Furry, MD 01/30/14 2118

## 2014-01-31 ENCOUNTER — Encounter (HOSPITAL_COMMUNITY)
Admission: EM | Disposition: A | Discharge status: Home / Self Care | Payer: Medicare Other | Source: Home / Self Care | Attending: Cardiology

## 2014-01-31 ENCOUNTER — Encounter (HOSPITAL_COMMUNITY): Payer: Self-pay | Admitting: General Practice

## 2014-01-31 DIAGNOSIS — I2119 ST elevation (STEMI) myocardial infarction involving other coronary artery of inferior wall: Secondary | ICD-10-CM

## 2014-01-31 DIAGNOSIS — I25810 Atherosclerosis of coronary artery bypass graft(s) without angina pectoris: Secondary | ICD-10-CM

## 2014-01-31 DIAGNOSIS — I2510 Atherosclerotic heart disease of native coronary artery without angina pectoris: Secondary | ICD-10-CM

## 2014-01-31 DIAGNOSIS — I214 Non-ST elevation (NSTEMI) myocardial infarction: Secondary | ICD-10-CM | POA: Diagnosis not present

## 2014-01-31 DIAGNOSIS — I10 Essential (primary) hypertension: Secondary | ICD-10-CM

## 2014-01-31 DIAGNOSIS — I209 Angina pectoris, unspecified: Secondary | ICD-10-CM

## 2014-01-31 HISTORY — PX: LEFT HEART CATHETERIZATION WITH CORONARY/GRAFT ANGIOGRAM: SHX5450

## 2014-01-31 HISTORY — PX: PERCUTANEOUS CORONARY STENT INTERVENTION (PCI-S): SHX5485

## 2014-01-31 LAB — BASIC METABOLIC PANEL
Anion gap: 12 (ref 5–15)
BUN: 25 mg/dL — ABNORMAL HIGH (ref 6–23)
CALCIUM: 9.2 mg/dL (ref 8.4–10.5)
CO2: 25 mEq/L (ref 19–32)
Chloride: 99 mEq/L (ref 96–112)
Creatinine, Ser: 1 mg/dL (ref 0.50–1.35)
GFR calc Af Amer: 88 mL/min — ABNORMAL LOW (ref >90)
GFR, EST NON AFRICAN AMERICAN: 76 mL/min — AB (ref >90)
Glucose, Bld: 120 mg/dL — ABNORMAL HIGH (ref 70–99)
Potassium: 3.9 mEq/L (ref 3.7–5.3)
SODIUM: 136 meq/L — AB (ref 137–147)

## 2014-01-31 LAB — CBC
HCT: 42.6 % (ref 39.0–52.0)
Hemoglobin: 14.5 g/dL (ref 13.0–17.0)
MCH: 29.2 pg (ref 26.0–34.0)
MCHC: 34 g/dL (ref 30.0–36.0)
MCV: 85.7 fL (ref 78.0–100.0)
PLATELETS: 171 10*3/uL (ref 150–400)
RBC: 4.97 MIL/uL (ref 4.22–5.81)
RDW: 13.8 % (ref 11.5–15.5)
WBC: 7.2 10*3/uL (ref 4.0–10.5)

## 2014-01-31 LAB — LIPID PANEL
CHOL/HDL RATIO: 8.8 ratio
CHOLESTEROL: 220 mg/dL — AB (ref 0–200)
HDL: 25 mg/dL — AB (ref >39)
LDL Cholesterol: 134 mg/dL — ABNORMAL HIGH (ref 0–99)
Triglycerides: 307 mg/dL — ABNORMAL HIGH (ref <150)
VLDL: 61 mg/dL — ABNORMAL HIGH (ref 0–40)

## 2014-01-31 LAB — TROPONIN I
TROPONIN I: 0.77 ng/mL — AB (ref <0.30)
Troponin I: <0.3 ng/mL (ref <0.30)
Troponin I: 3.01 ng/mL (ref <0.30)

## 2014-01-31 LAB — PROTIME-INR
INR: 1.08 (ref 0.00–1.49)
Prothrombin Time: 14 seconds (ref 11.6–15.2)

## 2014-01-31 LAB — HEPARIN LEVEL (UNFRACTIONATED): Heparin Unfractionated: 0.21 IU/mL — ABNORMAL LOW (ref 0.30–0.70)

## 2014-01-31 SURGERY — LEFT HEART CATHETERIZATION WITH CORONARY/GRAFT ANGIOGRAM
Anesthesia: LOCAL

## 2014-01-31 MED ORDER — FENTANYL CITRATE 0.05 MG/ML IJ SOLN
INTRAMUSCULAR | Status: AC
  Filled 2014-01-31: qty 2

## 2014-01-31 MED ORDER — TICAGRELOR 90 MG PO TABS
90.0000 mg | ORAL_TABLET | Freq: Two times a day (BID) | ORAL | Status: DC
  Administered 2014-02-01: 90 mg via ORAL
  Filled 2014-01-31 (×2): qty 1

## 2014-01-31 MED ORDER — VERAPAMIL HCL 2.5 MG/ML IV SOLN
INTRAVENOUS | Status: AC
  Filled 2014-01-31: qty 2

## 2014-01-31 MED ORDER — INFLUENZA VAC SPLIT QUAD 0.5 ML IM SUSY
0.5000 mL | PREFILLED_SYRINGE | Freq: Once | INTRAMUSCULAR | Status: AC
  Administered 2014-01-31: 0.5 mL via INTRAMUSCULAR
  Filled 2014-01-31: qty 0.5

## 2014-01-31 MED ORDER — ACETAMINOPHEN 325 MG PO TABS
650.0000 mg | ORAL_TABLET | ORAL | Status: DC | PRN
Start: 2014-01-31 — End: 2014-02-01

## 2014-01-31 MED ORDER — SODIUM CHLORIDE 0.9 % IV SOLN: INTRAVENOUS | Status: AC

## 2014-01-31 MED ORDER — LIDOCAINE HCL (PF) 1 % IJ SOLN
INTRAMUSCULAR | Status: AC
  Filled 2014-01-31: qty 30

## 2014-01-31 MED ORDER — ASPIRIN 81 MG PO CHEW
81.0000 mg | CHEWABLE_TABLET | Freq: Every day | ORAL | Status: DC
  Administered 2014-02-01: 81 mg via ORAL
  Filled 2014-01-31: qty 1

## 2014-01-31 MED ORDER — HEPARIN (PORCINE) IN NACL 2-0.9 UNIT/ML-% IJ SOLN
INTRAMUSCULAR | Status: AC
  Filled 2014-01-31: qty 1000

## 2014-01-31 MED ORDER — NITROGLYCERIN 1 MG/10 ML FOR IR/CATH LAB
INTRA_ARTERIAL | Status: AC
  Filled 2014-01-31: qty 10

## 2014-01-31 MED ORDER — TICAGRELOR 90 MG PO TABS
ORAL_TABLET | ORAL | Status: AC
  Filled 2014-01-31: qty 2

## 2014-01-31 MED ORDER — BIVALIRUDIN 250 MG IV SOLR
INTRAVENOUS | Status: AC
  Filled 2014-01-31: qty 250

## 2014-01-31 MED ORDER — ONDANSETRON HCL 4 MG/2ML IJ SOLN: 4.0000 mg | Freq: Four times a day (QID) | INTRAMUSCULAR | Status: DC | PRN

## 2014-01-31 MED ORDER — MIDAZOLAM HCL 2 MG/2ML IJ SOLN
INTRAMUSCULAR | Status: AC
  Filled 2014-01-31: qty 2

## 2014-01-31 MED ORDER — ATROPINE SULFATE 0.1 MG/ML IJ SOLN
INTRAMUSCULAR | Status: AC
  Filled 2014-01-31: qty 10

## 2014-01-31 NOTE — CV Procedure (Addendum)
    PROCEDURE:  PCI SVG to RCA system with distal embolic protection  INDICATIONS:  Inferior MI  The risks, benefits, and details of the procedure were explained to the patient.  The patient verbalized understanding and wanted to proceed.  Informed written consent was obtained.  PROCEDURE TECHNIQUE:  After Xylocaine anesthesia the 57F sheath was replaced for a 6 French sheath in the right femoral artery.   The intervention was then performed. Please see below for details. The sheath will be removed using manual compression.   CONTRAST:  Total of 70 cc.  COMPLICATIONS:  None.        ANGIOGRAPHIC DATA:     The SVG to right coronary artery has a 95% distal stenosis. There is mild disease after this very tight stenosis.  PCI NARRATIVE:  And RCB guide was used to engage the SVG to RCA. Pro-water wire was placed. Angiomax used for anticoagulation. An ACT was used to check that the Angiomax was therapeutic.  A 4 mm spider distal embolic protection device was placed over the pro-water wire into the distal portion of the graft. It was advanced to the most distal part possible. A 2.5 x 20 balloon was used to predilate the very tight stenosis. After predilatation, a mild lesion past the tight stenosis looked improved. A 3.0 x 23 Alpine drug-eluting stent was deployed at 16 mm. There was an excellent step up and step down. The embolic protection device was removed. There was significant debris in the basket. A dose of intra-coronary verapamil was given. TIMI-3 flow was maintained. There was an excellent angiographic result.   IMPRESSIONS:  1.  Successful PCI of the SVG to right coronary artery graft with a 3.0 x 23  Alpine drug-eluting stent - dilated to 3.25 mm in diameter, using a distal embolic protection device, 4.0 spider.   RECOMMENDATION:  Continue dual antiplatelet therapy for at least a year. Continue aggressive secondary prevention.  Cardiology followup with Dr. Harl Bowie. Possible discharge  tomorrow if there are no complications.

## 2014-01-31 NOTE — Progress Notes (Signed)
UR Completed [place] Phegley Graves-Bigelow, RN,BSN [phone]  

## 2014-01-31 NOTE — Progress Notes (Signed)
Family in to see. Instructions reviewed w/patient.

## 2014-01-31 NOTE — Interval H&P Note (Signed)
History and Physical Interval Note:  01/31/2014 2:46 PM  [name]  has presented today for surgery, with the diagnosis of cp  The various methods of treatment have been discussed with the patient and family. After consideration of risks, benefits and other options for treatment, the patient has consented to  Procedure(s): LEFT HEART CATHETERIZATION WITH CORONARY/GRAFT ANGIOGRAM (N/A) as a surgical intervention .  The patient's history has been reviewed, patient examined, no change in status, stable for surgery.  I have reviewed the patient's chart and labs.  Questions were answered to the patient's satisfaction.     Kermitt Harjo

## 2014-01-31 NOTE — Progress Notes (Addendum)
    Subjective:  68 year old with CAD post CABG 2008 x 5 (LIMA-LAD, VG-D2, VG-D1-OM, VG-RPL), HTN, HL, TOB with CP and now elevated Troponin consistent with NSTEMI.   Dragging a hose, pressure 20 min. Severe.     Objective:  Vital Signs in the last 24 hours: Temp:  [97.7 F (36.5 C)-97.8 F (36.6 C)] 97.7 F (36.5 C) (09/18 0411) Pulse Rate:  [46-60] 46 (09/18 0411) Resp:  [17-20] 18 (09/17 2123) BP: (128-191)/(43-64) 128/43 mmHg (09/18 0411) SpO2:  [94 %-96 %] 94 % (09/18 0411) Weight:  [225 lb (102.059 kg)-228 lb 11.2 oz (103.738 kg)] 228 lb 11.2 oz (103.738 kg) (09/17 2228)  Intake/Output from previous day:     Physical Exam: General: Well developed, well nourished, in no acute distress. Head:  Normocephalic and atraumatic. Lungs: Clear to auscultation and percussion. Heart: Normal S1 and S2.  No murmur, rubs or gallops. CABG scar Abdomen: soft, non-tender, positive bowel sounds. Extremities: No clubbing or cyanosis. No edema. Neurologic: Alert and oriented x 3.    Lab Results:  Recent Labs  01/30/14 2020 01/31/14 0457  WBC 7.9 7.2  HGB 16.1 14.5  PLT 210 171    Recent Labs  01/30/14 2020 01/31/14 0457  NA 139 136*  K 4.1 3.9  CL 99 99  CO2 28 25  GLUCOSE 95 120*  BUN 26* 25*  CREATININE 1.42* 1.00    Recent Labs  01/30/14 2020 01/31/14 0457  TROPONINI <0.30 0.77*   Imaging: Dg Chest Port 1 View  01/30/2014   CLINICAL DATA:  Chest pain.  History of smoking.  EXAM: PORTABLE CHEST - 1 VIEW  COMPARISON:  Chest radiograph performed 06/16/2006  FINDINGS: The lungs are well-aerated. Mild vascular congestion is noted. There is no evidence of pleural effusion or pneumothorax.  The cardiomediastinal silhouette is within normal in size. The patient is status post median sternotomy. No acute osseous abnormalities are seen.  IMPRESSION: Mild vascular congestion noted; lungs remain grossly clear.   Electronically Signed   By: Garald Balding M.D.   On:  01/30/2014 21:21   Personally viewed.   Telemetry: no adverse rhythm Personally viewed.   EKG:  ST elevation (j point) inferior leads. Minimally dynamic when compared to prior.   Assessment/Plan:  Active Problems:   Unstable angina  1) NSTEMI  - cath today, Trop 0.7, J point ST elevation inferior (seen in 2010 but slightly dynamic).   - Hep IV  - ASA  - metoprolol  - atorva   2) CAD  - CABG as above  3) Elevated creat. 1.4 down to 1.0 after hydration.   4) HTN  - improved. Anxiety playing a role.   Risk and benefits of cath explained (bleeding, death, CVA). Possible left radial approach.   SKAINS, [place] 01/31/2014, 8:34 AM

## 2014-01-31 NOTE — Care Management Note (Signed)
    Page 1 of 1   02/01/2014     9:09:57 AM CARE MANAGEMENT NOTE 02/01/2014  Patient:  [name], [name]   Account Number:  [id number]  Date Initiated:  01/31/2014  Documentation initiated by:  GRAVES-BIGELOW,Aliese Brannum  Subjective/Objective Assessment:   Pt admitted for chest pain, increased troponins. Plan for cath 01-31-14.     Action/Plan:   CM will continue to monitor for disposition needs.   Anticipated DC Date:  02/02/2014   Anticipated DC Plan:  [place]  CM consult      Choice offered to / List presented to:             Status of service:  Completed, signed off Medicare Important Message given?  YES (If response is "NO", the following Medicare IM given date fields will be blank) Date Medicare IM given:  01/31/2014 Medicare IM given by:  GRAVES-BIGELOW,Elias Bordner Date Additional Medicare IM given:   Additional Medicare IM given by:    Discharge Disposition:  HOME/SELF CARE  Per UR Regulation:  Reviewed for med. necessity/level of care/duration of stay  If discussed at [place] of Stay Meetings, dates discussed:    Comments:  02-01-14 0901 Jacqlyn Krauss, RN,BSN [phone] CM did call [facility] in [place] and medication is available. CM did provide pt with 30 day free card. Pt will need Rx for 30 day free no refills and the original with refills. Pt states he usually goes to the [place] in [place]. Not sure if the [facility] will cover the brilinta at no cost for pt. [facility] No further needs from CM at this time.

## 2014-01-31 NOTE — Op Note (Signed)
CARDIAC CATHETERIZATION REPORT   Procedures performed:  1. Left heart catheterization  2. Selective coronary angiography  3. Left ventriculography   Reason for procedure:  Acute non ST segment elevation myocardial infarction   Procedure performed by: Sanda Klein, MD, [facility]  Complications: none   Estimated blood loss: less than 5 mL   History:  The patient is a 66M with a history of CAD s/p CABG (LIMA-LAD, VG-D2, VG-D1-OM, VG-RPL) in 2008 presenting with new onset chest pain x 1 day. He reports that he was dragging a hose across his yard and reports sudden onset chest pressure that lasted at least 30 minutes. He notes that he had never had such severe chest pain; even when he had his CABG previously he did not present with this degree of chest discomfort. Peak troponin so far is 3.0.   Consent: The risks, benefits, and details of the procedure were explained to the patient. Risks including death, MI, stroke, bleeding, limb ischemia, renal failure and allergy were described and accepted by the patient. Informed written consent was obtained prior to proceeding.  Technique: The patient was brought to the cardiac catheterization laboratory in the fasting state. He was prepped and draped in the usual sterile fashion. Local anesthesia with 1% lidocaine was administered to the right groin area. Using the modified Seldinger technique a 5 French right common femoral artery sheath was introduced without difficulty. Under fluoroscopic guidance, using 5 [place] JL4, JR and angled pigtail catheters, selective cannulation of the left coronary artery, right coronary artery and left ventricle were respectively performed. Several coronary angiograms in a variety of projections were recorded, as well as a left ventriculogram in the RAO projection. Left ventricular pressure and a pull back to the aorta were recorded. No immediate complications occurred. At the end of the procedure, all catheters were removed.  After the procedure, hemostasis will be achieved with manual pressure.  Contrast used: 105 mL Omnipaque  Angiographic Findings:  1. The left main coronary artery is not stenotic, but exhibits significant atherosclerosis and bifurcates in the usual fashion into the left anterior descending artery and left circumflex coronary artery.  2. The left anterior descending artery is a large vessel that reaches the apex and generates several proximal septals, but is occluded before any of the major diagonal branches. There is evidence of extensive luminal irregularities and mild calcification.  3. The left circumflex coronary artery is a medium-size vessel non dominant vessel that generates two major oblique marginal arteries. There is evidence of extensive luminal irregularities and mild calcification. No hemodynamically meaningful stenoses are seen. The first OM is flush occluded. The distal OM is medium in size. 4. The right coronary artery is a very large-size dominant vessel that generates a posterior lateral ventricular artery that is proximally occluded as well as a diffusely diseased PDA. There is evidence of extensive luminal irregularities and mild calcification.  5. The sequential SVG to the diagonal and first OM is very irregular in caliber, but widely patent, without stenoses. The first diagonal artery is severely diseased and small in caliber. The OM branch is large and free of major stenoses. 6. The SVG to the second diagonal is widely patent and healthy, feeding a relatively large vessel. 7. The LIMA to the LAD is widely patent with both retrograde and antegrade LAD flow. There are significant stenoses both upstream and downstream of the anastomosis, but there is excellent flow. 8. The SVG to RCA-PLA has a severe, ulcerated stenosis in its distal third, at  least 95% in severity, but with TIMI 3 flow. It is the culprit lesion. 5. The left ventricle was not injected.There is no aortic valve stenosis  by pullback. The left ventricular end-diastolic pressure is 24 mm Hg.    IMPRESSIONS:   High grade unstable stenosis in the SVG to PLA causing NSTEMI RECOMMENDATION:  Urgent PCI, preferably with filter wire.    Sanda Klein, MD, [facility] [phone] office [phone] pager

## 2014-01-31 NOTE — Progress Notes (Addendum)
To Lab 9. Heparin turned off.

## 2014-01-31 NOTE — Progress Notes (Addendum)
Received pt from PCI procedure alert and denies any discomfort. Post EKG competed as ordered.  IVF infusing .  Pt to be transported to 6500  Bed 5 via his bed. Report called to [facility] on 6500.  Pt was able to void  350cc of clear yellow urine

## 2014-01-31 NOTE — Progress Notes (Signed)
ANTICOAGULATION CONSULT NOTE - Follow Up Consult  Pharmacy Consult for Heparin  Indication: chest pain/ACS  Allergies  Allergen Reactions  . Cardizem Cd [Diltiazem Hcl Er Beads] Other (See Comments)    Makes heart skip    Patient Measurements: Height: 5' 9.5" (176.5 cm) Weight: 228 lb 11.2 oz (103.738 kg) IBW/kg (Calculated) : 71.85  Vital Signs: Temp: 97.7 F (36.5 C) (09/18 0411) Temp src: Oral (09/18 0411) BP: 128/43 mmHg (09/18 0411) Pulse Rate: 46 (09/18 0411)  Labs:  Recent Labs  01/30/14 0027 01/30/14 2020 01/31/14 0457  HGB  --  16.1 14.5  HCT  --  46.6 42.6  PLT  --  210 171  LABPROT  --  13.4 14.0  INR  --  1.02 1.08  HEPARINUNFRC  --   --  0.21*  CREATININE  --  1.42* 1.00  TROPONINI <0.30 <0.30 0.77*    Estimated Creatinine Clearance: 85.8 ml/min (by C-G formula based on Cr of 1).  Assessment: 68 y/o tx from [facility] with CP, first HL is sub-therapeutic, other labs as as above, no issues per RN.   Goal of Therapy:  Heparin level 0.3-0.7 units/ml Monitor platelets by anticoagulation protocol: Yes   Plan:  -Increase heparin to 1500 units/hr -1300 HL -Daily CBC/HL -Monitor for bleeding  Jak, [place] 01/31/2014,6:30 AM

## 2014-01-31 NOTE — Progress Notes (Signed)
CRITICAL VALUE ALERT  Critical value received:  Troponin .77   Date of notification:  01/31/14  Time of notification:  0620  Critical value read back:Yes.    Nurse who received alert:  Prescilla Sours, RN   MD notified (1st page):  Samara Snide  Time of first page:  [phone]  Responding MD:  Samara Snide  Time MD responded:  [phone]

## 2014-01-31 NOTE — H&P (View-Only) (Signed)
    Subjective:  68 year old with CAD post CABG 2008 x 5 (LIMA-LAD, VG-D2, VG-D1-OM, VG-RPL), HTN, HL, TOB with CP and now elevated Troponin consistent with NSTEMI.   Dragging a hose, pressure 20 min. Severe.     Objective:  Vital Signs in the last 24 hours: Temp:  [97.7 F (36.5 C)-97.8 F (36.6 C)] 97.7 F (36.5 C) (09/18 0411) Pulse Rate:  [46-60] 46 (09/18 0411) Resp:  [17-20] 18 (09/17 2123) BP: (128-191)/(43-64) 128/43 mmHg (09/18 0411) SpO2:  [94 %-96 %] 94 % (09/18 0411) Weight:  [225 lb (102.059 kg)-228 lb 11.2 oz (103.738 kg)] 228 lb 11.2 oz (103.738 kg) (09/17 2228)  Intake/Output from previous day:     Physical Exam: General: Well developed, well nourished, in no acute distress. Head:  Normocephalic and atraumatic. Lungs: Clear to auscultation and percussion. Heart: Normal S1 and S2.  No murmur, rubs or gallops. CABG scar Abdomen: soft, non-tender, positive bowel sounds. Extremities: No clubbing or cyanosis. No edema. Neurologic: Alert and oriented x 3.    Lab Results:  Recent Labs  01/30/14 2020 01/31/14 0457  WBC 7.9 7.2  HGB 16.1 14.5  PLT 210 171    Recent Labs  01/30/14 2020 01/31/14 0457  NA 139 136*  K 4.1 3.9  CL 99 99  CO2 28 25  GLUCOSE 95 120*  BUN 26* 25*  CREATININE 1.42* 1.00    Recent Labs  01/30/14 2020 01/31/14 0457  TROPONINI <0.30 0.77*   Imaging: Dg Chest Port 1 View  01/30/2014   CLINICAL DATA:  Chest pain.  History of smoking.  EXAM: PORTABLE CHEST - 1 VIEW  COMPARISON:  Chest radiograph performed 06/16/2006  FINDINGS: The lungs are well-aerated. Mild vascular congestion is noted. There is no evidence of pleural effusion or pneumothorax.  The cardiomediastinal silhouette is within normal in size. The patient is status post median sternotomy. No acute osseous abnormalities are seen.  IMPRESSION: Mild vascular congestion noted; lungs remain grossly clear.   Electronically Signed   By: Garald Balding M.D.   On:  01/30/2014 21:21   Personally viewed.   Telemetry: no adverse rhythm Personally viewed.   EKG:  ST elevation (j point) inferior leads. Minimally dynamic when compared to prior.   Assessment/Plan:  Active Problems:   Unstable angina  1) NSTEMI  - cath today, Trop 0.7, J point ST elevation inferior (seen in 2010 but slightly dynamic).   - Hep IV  - ASA  - metoprolol  - atorva   2) CAD  - CABG as above  3) Elevated creat. 1.4 down to 1.0 after hydration.   4) HTN  - improved. Anxiety playing a role.   Risk and benefits of cath explained (bleeding, death, CVA). Possible left radial approach.   SKAINS, [place] 01/31/2014, 8:34 AM

## 2014-01-31 NOTE — Progress Notes (Signed)
Site area: right groin  Site Prior to Removal:  Level 0  Pressure Applied For 20 MINUTES    Minutes Beginning at 21:45  Manual:   Yes.    Patient Status During Pull:  WNL  Post Pull Groin Site:  Level 0  Post Pull Instructions Given:  Yes.    Post Pull Pulses Present:  Yes.    Dressing Applied:  Yes.    Comments:

## 2014-02-01 DIAGNOSIS — I200 Unstable angina: Secondary | ICD-10-CM

## 2014-02-01 DIAGNOSIS — E782 Mixed hyperlipidemia: Secondary | ICD-10-CM

## 2014-02-01 LAB — CBC
HCT: 41.9 % (ref 39.0–52.0)
HEMOGLOBIN: 14.7 g/dL (ref 13.0–17.0)
MCH: 29.9 pg (ref 26.0–34.0)
MCHC: 35.1 g/dL (ref 30.0–36.0)
MCV: 85.3 fL (ref 78.0–100.0)
Platelets: 167 10*3/uL (ref 150–400)
RBC: 4.91 MIL/uL (ref 4.22–5.81)
RDW: 13.9 % (ref 11.5–15.5)
WBC: 6.8 10*3/uL (ref 4.0–10.5)

## 2014-02-01 LAB — BASIC METABOLIC PANEL
Anion gap: 10 (ref 5–15)
BUN: 18 mg/dL (ref 6–23)
CALCIUM: 9.1 mg/dL (ref 8.4–10.5)
CO2: 27 meq/L (ref 19–32)
CREATININE: 1.12 mg/dL (ref 0.50–1.35)
Chloride: 102 mEq/L (ref 96–112)
GFR calc Af Amer: 77 mL/min — ABNORMAL LOW (ref >90)
GFR, EST NON AFRICAN AMERICAN: 66 mL/min — AB (ref >90)
Glucose, Bld: 82 mg/dL (ref 70–99)
Potassium: 4.4 mEq/L (ref 3.7–5.3)
SODIUM: 139 meq/L (ref 137–147)

## 2014-02-01 MED ORDER — ASPIRIN 81 MG PO CHEW: 81.0000 mg | CHEWABLE_TABLET | Freq: Every day | ORAL | Status: DC

## 2014-02-01 MED ORDER — LISINOPRIL 40 MG PO TABS
40.0000 mg | ORAL_TABLET | Freq: Every day | ORAL | Status: DC
  Filled 2014-02-01 (×2): qty 1

## 2014-02-01 MED ORDER — ALUM & MAG HYDROXIDE-SIMETH 200-200-20 MG/5ML PO SUSP
30.0000 mL | ORAL | Status: DC | PRN
  Administered 2014-02-01: 30 mL via ORAL
  Filled 2014-02-01: qty 30

## 2014-02-01 MED ORDER — TICAGRELOR 90 MG PO TABS: 90.0000 mg | ORAL_TABLET | Freq: Two times a day (BID) | ORAL | Status: DC

## 2014-02-01 MED ORDER — NITROGLYCERIN 0.4 MG SL SUBL: 0.4000 mg | SUBLINGUAL_TABLET | SUBLINGUAL | Status: DC | PRN

## 2014-02-01 NOTE — Progress Notes (Signed)
CARDIAC REHAB PHASE I   PRE:  Rate/Rhythm: 36 SB  BP:  Supine:   Sitting: 184/55  Standing:    SaO2: 96% RA  MODE:  Ambulation: 600 ft   POST:  Rate/Rhythm: 61 SR  BP:  Supine:   Sitting: 197/83  Standing:    SaO2: 100% RA  [id number] Patient tolerated ambulation well with assist x1, no c/o. BP elevated before and after ambulation, I informed patient's RN regarding elevated BP. MI/PCI education completed with patient and patient's family including restrictions, CP, NTG use, calling 911, Brilinta use, risk factor modification and activity progression. MI book given. Tobacco cessation information given, patient will need further encouragement regarding tobacco cessation. Patient verbalizes understanding of instruction given. Discussed Phase 2 cardiac rehab, but patient declines referral at this time.  Sol Passer, MS, ACSM CCEP

## 2014-02-01 NOTE — Progress Notes (Signed)
Subjective: Complains of RUQ pain.  Thinks gas  Has not had BM   Objective: Filed Vitals:   02/01/14 0615 02/01/14 0633 02/01/14 0703 02/01/14 0730  BP: 191/72 204/85 185/68 173/64  Pulse:   58   Temp:   97.9 F (36.6 C)   TempSrc:   Oral   Resp:      Height:      Weight:      SpO2:   94%    Weight change: 3 lb 2.8 oz (1.441 kg)  Intake/Output Summary (Last 24 hours) at 02/01/14 0813 Last data filed at 02/01/14 0805  Gross per 24 hour  Intake 1031.83 ml  Output   1300 ml  Net -268.17 ml    General: Alert, awake, oriented x3, in no acute distress Neck:  JVP is normal Heart: Regular rate and rhythm, without murmurs, rubs, gallops.  Lungs: Clear to auscultation.  No rales or wheezes. Exemities:  No edema.   Neuro: Grossly intact, nonfocal.  TEle:  SR Lab Results: Results for orders placed during the hospital encounter of 01/30/14 (from the past 24 hour(s))  TROPONIN I     Status: Abnormal   Collection Time    01/31/14 12:30 PM      Result Value Ref Range   Troponin I 3.01 (*) <0.30 ng/mL  CBC     Status: None   Collection Time    02/01/14  3:50 AM      Result Value Ref Range   WBC 6.8  4.0 - 10.5 K/uL   RBC 4.91  4.22 - 5.81 MIL/uL   Hemoglobin 14.7  13.0 - 17.0 g/dL   HCT 41.9  39.0 - 52.0 %   MCV 85.3  78.0 - 100.0 fL   MCH 29.9  26.0 - 34.0 pg   MCHC 35.1  30.0 - 36.0 g/dL   RDW 13.9  11.5 - 15.5 %   Platelets 167  150 - 400 K/uL  BASIC METABOLIC PANEL     Status: Abnormal   Collection Time    02/01/14  3:50 AM      Result Value Ref Range   Sodium 139  137 - 147 mEq/L   Potassium 4.4  3.7 - 5.3 mEq/L   Chloride 102  96 - 112 mEq/L   CO2 27  19 - 32 mEq/L   Glucose, Bld 82  70 - 99 mg/dL   BUN 18  6 - 23 mg/dL   Creatinine, Ser 1.12  0.50 - 1.35 mg/dL   Calcium 9.1  8.4 - 10.5 mg/dL   GFR calc non Af Amer 66 (*) >90 mL/min   GFR calc Af Amer 77 (*) >90 mL/min   Anion gap 10  5 - 15    Studies/Results: No results found.  Medications:  Reviewed   @PROBHOSP @  1.  CAD   LM Signif atherosclerosis; LAD 100% prox; LCx nondomin  Mild CAD  )M1 100%;  RCA dominant:  PLSA 100% prox.  PDA diffuse dz.  SVG to DIag and OM1  Patent; SVG to D2 patent  LIMA to LAD patent;  SVG to RCA/PLA 95% stenosis.    underwent PCI/DES to SVG to RCA  Plan for DAPT  For at least 1 yr .    2.  HTN  BP is high this am  WIl need to review what he has received.  Will resume lisinoprl 40  On amlodipine.    Ambultate  Probable d/c today.    LOS: 2  days   [name] 02/01/2014, 8:13 AM

## 2014-02-01 NOTE — Discharge Summary (Signed)
Physician Discharge Summary  Patient ID: [name] MRN: [id number] DOB/AGE: 1945-10-08 68 y.o.  Admit date: 01/30/2014 Discharge date: 02/01/2014  Primary Discharge Diagnosis:  1.CAD: S/P PCI to SVG to RCA graft with DES 2.Hx of CABG 2008 (LIMA to LAD, VG to D2, VG to D1-OM, VG to RPL)  Secondary Discharge Diagnosis 1. Hypertension 2. Hypecholesterolemia  Primary Cardiologist: Carlyle Dolly (to be est) [place]  Significant Diagnostic Studies: 1.Cardiac Cath: 918/2015  ANGIOGRAPHIC DATA:  The SVG to right coronary artery has a 95% distal stenosis. There is mild disease after this very tight stenosis.  PCI NARRATIVE: And RCB guide was used to engage the SVG to RCA. Pro-water wire was placed. Angiomax used for anticoagulation. An ACT was used to check that the Angiomax was therapeutic. A 4 mm spider distal embolic protection device was placed over the pro-water wire into the distal portion of the graft. It was advanced to the most distal part possible. A 2.5 x 20 balloon was used to predilate the very tight stenosis. After predilatation, a mild lesion past the tight stenosis looked improved. A 3.0 x 23 Alpine drug-eluting stent was deployed at 16 mm. There was an excellent step up and step down. The embolic protection device was removed. There was significant debris in the basket. A dose of intra-coronary verapamil was given. TIMI-3 flow was maintained. There was an excellent angiographic result.  IMPRESSIONS:  1. Successful PCI of the SVG to right coronary artery graft with a 3.0 x 23 Alpine drug-eluting stent - dilated to 3.25 mm in diameter, using a distal embolic protection device, 4.0 spider. RECOMMENDATION: Continue dual antiplatelet therapy for at least a year. Continue aggressive secondary prevention   Hospital Course:       [name] is a 68 year old male patient with known history of coronary artery disease, status post CABG in 2008, hypertension, hyperlipidemia,  normally followed by the [facility] clinic in [place], [place]. He presented to ER with chest pain, described as sudden pressure, lasting approximately 30 minutes. He presented any pain. ER for additional evaluation as pain was unrelenting, but by the time he got to the ER. The pain had subsided. Due to this history and recurrent symptoms. He was transferred to [facility] for reevaluation.    He was diagnosed with non-ST elevation MI, with troponins initially normal, but rising to 0.77 and 3.01, he was subtotally started on heparin infusion, and nitroglycerin drip. respectively. He was planned for cardiac catheterization. Cardiac catheterization was completed by Dr.Varanasi on 01/31/2014. He was found to have SVG to right coronary artery with 95% distal stenosis with mild disease after this very tight stenosis. He had subsequent PCI of the SVG to right coronary artery with a 3.0 x 23 Alpine drug-eluting stent. He was to continue dual antiplatelet therapy for one year with aggressive secondary prevention.    The patient was seen and examined by Dr. Wyatt Haste on rounds the day of discharge. He was found to be stable without complaints.  Blood pressure slightly elevated at 173/64. He was given his dose of the Cipro orally milligrams, and started on amlodipine , 10 mg daily. The patient will followup in the office since that is where he lives, for ongoing assessment and management of CAD and symptoms. He wishes to continue with [facility] for medical treatment.  Discharge Exam: Blood pressure 173/64, pulse 58, temperature 97.9 F (36.6 C), temperature source Oral, resp. rate 20, height 5' 9.5" (1.765 m), weight 228 lb  2.8 oz (103.5 kg), SpO2 94.00%.   Labs:   Lab Results  Component Value Date   WBC 6.8 02/01/2014   HGB 14.7 02/01/2014   HCT 41.9 02/01/2014   MCV 85.3 02/01/2014   PLT 167 02/01/2014     Recent Labs Lab 02/01/14 0350  NA 139  K 4.4  CL 102  CO2 27  BUN 18  CREATININE 1.12  CALCIUM 9.1   GLUCOSE 82   Lab Results  Component Value Date   TROPONINI 3.01* 01/31/2014    Lab Results  Component Value Date   CHOL 220* 01/31/2014   CHOL 168 02/10/2010   CHOL 147 01/28/2009   Lab Results  Component Value Date   HDL 25* 01/31/2014   HDL 26.40* 02/10/2010   HDL 23.80* 01/28/2009   Lab Results  Component Value Date   LDLCALC 134* 01/31/2014   LDLCALC 118* 04/23/2008   LDLCALC 80 07/17/2006   Lab Results  Component Value Date   TRIG 307* 01/31/2014   TRIG 348.0* 02/10/2010   TRIG 237.0* 01/28/2009   Lab Results  Component Value Date   CHOLHDL 8.8 01/31/2014   CHOLHDL 6 02/10/2010   CHOLHDL 6 01/28/2009   Lab Results  Component Value Date   LDLDIRECT 87.8 02/10/2010   LDLDIRECT 88.9 01/28/2009   LDLDIRECT 107.1 05/22/2007      Radiology: Dg Chest Port 1 View  01/30/2014   CLINICAL DATA:  Chest pain.  History of smoking.  EXAM: PORTABLE CHEST - 1 VIEW  COMPARISON:  Chest radiograph performed 06/16/2006  FINDINGS: The lungs are well-aerated. Mild vascular congestion is noted. There is no evidence of pleural effusion or pneumothorax.  The cardiomediastinal silhouette is within normal in size. The patient is status post median sternotomy. No acute osseous abnormalities are seen.  IMPRESSION: Mild vascular congestion noted; lungs remain grossly clear.   Electronically Signed   By: Garald Balding M.D.   On: 01/30/2014 21:21    [id number] bradycardia, rate of 51 bpm.   FOLLOW UP PLANS AND APPOINTMENTS     Discharge Instructions   Diet - low sodium heart healthy    Complete by:  As directed      Increase activity slowly    Complete by:  As directed             Medication List    STOP taking these medications       aspirin 325 MG EC tablet  Replaced by:  aspirin 81 MG chewable tablet      TAKE these medications       amLODipine 10 MG tablet  Commonly known as:  NORVASC  Take 10 mg by mouth daily.     aspirin 81 MG chewable tablet  Chew 1 tablet (81 mg total) by mouth  daily.     hydrochlorothiazide 25 MG tablet  Commonly known as:  HYDRODIURIL  Take 25 mg by mouth daily.     lisinopril 40 MG tablet  Commonly known as:  PRINIVIL,ZESTRIL  Take 40 mg by mouth daily.     metoprolol succinate 100 MG 24 hr tablet  Commonly known as:  TOPROL-XL  Take 100 mg by mouth daily. Take with or immediately following a meal.     nitroGLYCERIN 0.4 MG SL tablet  Commonly known as:  NITROSTAT  Place 1 tablet (0.4 mg total) under the tongue every 5 (five) minutes x 3 doses as needed for chest pain.     omeprazole 20 MG capsule  Commonly  known as:  PRILOSEC  Take 40 mg by mouth daily.     pravastatin 40 MG tablet  Commonly known as:  PRAVACHOL  Take 40 mg by mouth daily.     RA FISH OIL 1000 MG Caps  Take 1 capsule by mouth 2 (two) times daily.     ticagrelor 90 MG Tabs tablet  Commonly known as:  BRILINTA  Take 1 tablet (90 mg total) by mouth 2 (two) times daily.       Follow-up Information   Follow up with Arnoldo Lenis, MD. (Our offic will call you for appt in [facility] office.)    Specialty:  Cardiology   Contact information:   [place] [address] [phone]         Time spent with patient to include physician time:35 minutes. Signed: Phill Myron. Lawrence NP  02/01/2014, 8:51 AM Agree with plans for d/c.  Dorris Carnes

## 2014-02-01 NOTE — Progress Notes (Signed)
Manual BP; auto not accurate for this patient.

## 2014-02-01 NOTE — Progress Notes (Signed)
Manual BP °

## 2014-02-03 LAB — POCT ACTIVATED CLOTTING TIME
Activated Clotting Time: 118 seconds
Activated Clotting Time: 309 seconds

## 2014-02-03 MED FILL — Sodium Chloride IV Soln 0.9%: INTRAVENOUS | Qty: 50 | Status: AC

## 2014-02-05 NOTE — H&P (Signed)
Findings reviewed. Agree with plan. See my progress note.  Candee Furbish, MD

## 2014-02-19 ENCOUNTER — Encounter: Payer: Self-pay | Admitting: *Deleted

## 2014-02-19 ENCOUNTER — Ambulatory Visit (INDEPENDENT_AMBULATORY_CARE_PROVIDER_SITE_OTHER): Payer: Medicare Other | Admitting: Cardiology

## 2014-02-19 ENCOUNTER — Encounter: Payer: Self-pay | Admitting: Cardiology

## 2014-02-19 VITALS — BP 155/78 | HR 66 | Ht 69.0 in | Wt 230.2 lb

## 2014-02-19 DIAGNOSIS — I2510 Atherosclerotic heart disease of native coronary artery without angina pectoris: Secondary | ICD-10-CM

## 2014-02-19 DIAGNOSIS — I10 Essential (primary) hypertension: Secondary | ICD-10-CM

## 2014-02-19 DIAGNOSIS — R0989 Other specified symptoms and signs involving the circulatory and respiratory systems: Secondary | ICD-10-CM

## 2014-02-19 DIAGNOSIS — E785 Hyperlipidemia, unspecified: Secondary | ICD-10-CM

## 2014-02-19 MED ORDER — ATORVASTATIN CALCIUM 80 MG PO TABS: 80.0000 mg | ORAL_TABLET | Freq: Every day | ORAL | Status: DC

## 2014-02-19 NOTE — Progress Notes (Signed)
Clinical Summary Mr. Deamer is a 68 y.o.male seen today for follow up of the following medical problems.   1. CAD - hx of CABG in Jan 2008 (LIMA-LAD, SVG-D2, SVG-OM, SVG-RPL) - admit [facility]/30/14 [name] with NSTEMI, received DES to SVG-RCA. No LV gram. No recent echo in our system.  - denies any significant chest pain. Chronic SOB unchanged, DOE at 1 block. - compliant with meds including ASA and brillinta  2. HTN - compliant with meds, home numbers 130-140s/60  3. Hyperlipidemia - compliant with pravastatin 40 mg daily - changed from lipitor at the [place] to pravastatin per his report, he is not sure why  4. OSA? - fatigue ongoing for approx 4-5 months, feels tired. +snoring. - reports   5. Claudication - has referral to vascular  Past Medical History  Diagnosis Date  . Coronary artery disease     Artery bypass graft Jan 2008  . Hyperlipidemia   . Hypertension   . Tobacco user   . Acute myocardial infarction of other inferior wall, initial episode of care      Allergies  Allergen Reactions  . Cardizem Cd [Diltiazem Hcl Er Beads] Other (See Comments)    Makes heart skip     Current Outpatient Prescriptions  Medication Sig Dispense Refill  . amLODipine (NORVASC) 10 MG tablet Take 10 mg by mouth daily.      Marland Kitchen aspirin 81 MG chewable tablet Chew 1 tablet (81 mg total) by mouth daily.      . hydrochlorothiazide (HYDRODIURIL) 25 MG tablet Take 25 mg by mouth daily.      Marland Kitchen lisinopril (PRINIVIL,ZESTRIL) 40 MG tablet Take 40 mg by mouth daily.      . metoprolol succinate (TOPROL-XL) 100 MG 24 hr tablet Take 100 mg by mouth daily. Take with or immediately following a meal.      . nitroGLYCERIN (NITROSTAT) 0.4 MG SL tablet Place 1 tablet (0.4 mg total) under the tongue every 5 (five) minutes x 3 doses as needed for chest pain.  30 tablet  12  . Omega-3 Fatty Acids (RA FISH OIL) 1000 MG CAPS Take 1 capsule by mouth 2 (two) times daily.        Marland Kitchen omeprazole (PRILOSEC) 20 MG  capsule Take 40 mg by mouth daily.       . pravastatin (PRAVACHOL) 40 MG tablet Take 40 mg by mouth daily.      . ticagrelor (BRILINTA) 90 MG TABS tablet Take 1 tablet (90 mg total) by mouth 2 (two) times daily.  60 tablet  12   No current facility-administered medications for this visit.     Past Surgical History  Procedure Laterality Date  . Coronary artery bypass graft  06/14/2006    x 5  . Appendectomy    . Hernia repair    . Foot surgery    . Nose surgery       Allergies  Allergen Reactions  . Cardizem Cd [Diltiazem Hcl Er Beads] Other (See Comments)    Makes heart skip      Family History  Problem Relation Age of Onset  . Heart attack Mother   . Heart attack Brother      Social History Mr. Kulzer reports that he has been smoking Cigarettes.  He has a 80 pack-year smoking history. He does not have any smokeless tobacco history on file. Mr. Turknett reports that he does not drink alcohol.   Review of Systems CONSTITUTIONAL: Generalized fatigue HEENT:  Eyes: No visual loss, blurred vision, double vision or yellow sclerae.No hearing loss, sneezing, congestion, runny nose or sore throat.  SKIN: No rash or itching.  CARDIOVASCULAR: per HPI RESPIRATORY: No shortness of breath, cough or sputum.  GASTROINTESTINAL: No anorexia, nausea, vomiting or diarrhea. No abdominal pain or blood.  GENITOURINARY: No burning on urination, no polyuria NEUROLOGICAL: No headache, dizziness, syncope, paralysis, ataxia, numbness or tingling in the extremities. No change in bowel or bladder control.  MUSCULOSKELETAL: leg pain  LYMPHATICS: No enlarged nodes. No history of splenectomy.  PSYCHIATRIC: No history of depression or anxiety.  ENDOCRINOLOGIC: No reports of sweating, cold or heat intolerance. No polyuria or polydipsia.  Marland Kitchen   Physical Examination p 66 bp 140/70 Wt 230 lbs BMI 34 Gen: resting comfortably, no acute distress HEENT: no scleral icterus, pupils equal round and reactive,  no palptable cervical adenopathy,  CV: RRR, no m/r/g, no JVD, right carotid bruit Resp: Clear to auscultation bilaterally GI: abdomen is soft, non-tender, non-distended, normal bowel sounds, no hepatosplenomegaly MSK: extremities are warm, no edema.  Skin: warm, no rash Neuro:  no focal deficits Psych: appropriate affect   Diagnostic Studies 01/31/14 Cath Angiographic Findings:  1. The left main coronary artery is not stenotic, but exhibits significant atherosclerosis and bifurcates in the usual fashion into the left anterior descending artery and left circumflex coronary artery.  2. The left anterior descending artery is a large vessel that reaches the apex and generates several proximal septals, but is occluded before any of the major diagonal branches. There is evidence of extensive luminal irregularities and mild calcification.  3. The left circumflex coronary artery is a medium-size vessel non dominant vessel that generates two major oblique marginal arteries. There is evidence of extensive luminal irregularities and mild calcification. No hemodynamically meaningful stenoses are seen. The first OM is flush occluded. The distal OM is medium in size.  4. The right coronary artery is a very large-size dominant vessel that generates a posterior lateral ventricular artery that is proximally occluded as well as a diffusely diseased PDA. There is evidence of extensive luminal irregularities and mild calcification.  5. The sequential SVG to the diagonal and first OM is very irregular in caliber, but widely patent, without stenoses. The first diagonal artery is severely diseased and small in caliber. The OM Yunique [place] is large and free of major stenoses.  6. The SVG to the second diagonal is widely patent and healthy, feeding a relatively large vessel.  7. The LIMA to the LAD is widely patent with both retrograde and antegrade LAD flow. There are significant stenoses both upstream and downstream of the  anastomosis, but there is excellent flow.  8. The SVG to RCA-PLA has a severe, ulcerated stenosis in its distal third, at least 95% in severity, but with TIMI 3 flow. It is the culprit lesion.  5. The left ventricle was not injected.There is no aortic valve stenosis by pullback. The left ventricular end-diastolic pressure is 24 mm Hg.   IMPRESSIONS:  High grade unstable stenosis in the SVG to PLA causing NSTEMI  RECOMMENDATION:  Urgent PCI, preferably with filter wire.   IMPRESSIONS:  1. Successful PCI of the SVG to right coronary artery graft with a 3.0 x 23 Alpine drug-eluting stent - dilated to 3.25 mm in diameter, using a distal embolic protection device, 4.0 spider.        Assessment and Plan  1. CAD - recent DES to SVG-RVA 01/30/14 - denies any current symptoms - continue DAPT  until 01/2015  2. HTN - borderline elevated, pt will keep bp log and submit in 2 weeks  3. Hyperlipidemia - recommend high dose statin in setting of known CAD. He receives his medications from [place], have provided a note and Rx for atorva 80mg  daily  4. OSA? - several signs and symptoms of possible OSA, have provided not to pcp at [facility] to consider sleep study  5. Claudication - has upcoming appt with vascular  6. Carotid bruit - have also included in our [facility] to [facility] to obtain carotid US     Arnoldo Lenis, M.D.

## 2014-02-19 NOTE — Patient Instructions (Addendum)
Please record your blood pressure on BP log given. Bring the log with you at your follow up visit with Dr. Harl Bowie Letter given for recommended testing and medications. (Carotid Ultrasound and Lipitor) Your physician wants you to follow up in:  4 months.  You will receive a reminder letter in the mail one-two months in advance.  If you don't receive a letter, please call our office to schedule the follow up appointment

## 2014-04-24 ENCOUNTER — Encounter (HOSPITAL_COMMUNITY): Payer: Self-pay | Admitting: Cardiovascular Disease

## 2014-05-26 ENCOUNTER — Encounter: Payer: Self-pay | Admitting: *Deleted

## 2014-05-26 ENCOUNTER — Encounter: Payer: Self-pay | Admitting: Cardiology

## 2014-05-26 ENCOUNTER — Ambulatory Visit (INDEPENDENT_AMBULATORY_CARE_PROVIDER_SITE_OTHER): Payer: Medicare Other | Admitting: Cardiology

## 2014-05-26 VITALS — BP 153/83 | HR 55 | Temp 97.7°F | Ht 69.0 in | Wt 239.0 lb

## 2014-05-26 DIAGNOSIS — I2510 Atherosclerotic heart disease of native coronary artery without angina pectoris: Secondary | ICD-10-CM | POA: Diagnosis not present

## 2014-05-26 DIAGNOSIS — R195 Other fecal abnormalities: Secondary | ICD-10-CM | POA: Diagnosis not present

## 2014-05-26 NOTE — Patient Instructions (Addendum)
Your physician recommends that you schedule a follow-up appointment in: April WITH DR. Garfield  Your physician recommends that you continue on your current medications as directed. Please refer to the Current Medication list given to you today.  Your physician recommends that you return for labs: CBC, BMP, TSH. Dr. Harl Bowie has given you a note on the labs to take with you.   WE WILL REQUEST CAROTID US AND ABI FROM [facility]  Thank you for choosing [facility]!!

## 2014-05-26 NOTE — Progress Notes (Signed)
Clinical Summary [name] is a 69 y.o.male seen today as add on patient, this is a focused visit on recent dark tarry stools and fatigue.    1. CAD - hx of CABG in Jan 2008 (LIMA-LAD, SVG-D2, SVG-OM, SVG-RPL) - admit 01/30/14 [name] with NSTEMI, received DES to SVG-RCA. No LV gram. No recent echo in our system.  - denies any significant chest pain. Chronic SOB unchanged, DOE at 1 block. - compliant with meds including ASA and brillinta  - reports black/tarry stools x5-6 weeks, progressing. No bright red blood. Increase in fatigue, feeling cold at times. No lightheadness or dizziness, no syncope or presyncope.    2. OSA screen - fatigue ongoing for approx 4-5 months, feels tired. +snoring. - awaiting scheduling at [facility]  3. Claudication - has referral to vascular at [facility], reports recent ABIs   4 Carotid bruit - reports recent carotid US at [facility] - no neuro symptoms   Past Medical History  Diagnosis Date  . Coronary artery disease     Artery bypass graft Jan 2008  . Hyperlipidemia   . Hypertension   . Tobacco user   . Acute myocardial infarction of other inferior wall, initial episode of care      Allergies  Allergen Reactions  . Cardizem Cd [Diltiazem Hcl Er Beads] Other (See Comments)    Makes heart skip     Current Outpatient Prescriptions  Medication Sig Dispense Refill  . amLODipine (NORVASC) 10 MG tablet Take 10 mg by mouth daily.    Marland Kitchen aspirin 81 MG chewable tablet Chew 1 tablet (81 mg total) by mouth daily.    Marland Kitchen atorvastatin (LIPITOR) 80 MG tablet Take 1 tablet (80 mg total) by mouth daily. 30 tablet 6  . hydrochlorothiazide (HYDRODIURIL) 25 MG tablet Take 25 mg by mouth daily.    Marland Kitchen lisinopril (PRINIVIL,ZESTRIL) 40 MG tablet Take 20 mg by mouth daily. Takes 1/2 tablet daily    . metoprolol succinate (TOPROL-XL) 100 MG 24 hr tablet Take 50 mg by mouth daily. Takes half tablet daily    . nitroGLYCERIN (NITROSTAT) 0.4 MG SL tablet Place 1 tablet (0.4 mg  total) under the tongue every 5 (five) minutes x 3 doses as needed for chest pain. 30 tablet 12  . Omega-3 Fatty Acids (RA FISH OIL) 1000 MG CAPS Take 2 capsules by mouth 2 (two) times daily.     Marland Kitchen omeprazole (PRILOSEC) 20 MG capsule Take 40 mg by mouth daily.     . ticagrelor (BRILINTA) 90 MG TABS tablet Take 1 tablet (90 mg total) by mouth 2 (two) times daily. 60 tablet 12   No current facility-administered medications for this visit.     Past Surgical History  Procedure Laterality Date  . Coronary artery bypass graft  06/14/2006    x 5  . Appendectomy    . Hernia repair    . Foot surgery    . Nose surgery    . Left heart catheterization with coronary/graft angiogram N/A 01/31/2014    Procedure: LEFT HEART CATHETERIZATION WITH Beatrix Fetters;  Surgeon: Sanda Klein, MD;  Location: [facility] CATH LAB;  Service: Cardiovascular;  Laterality: N/A;  . Percutaneous coronary stent intervention (pci-s)  01/31/2014    Procedure: PERCUTANEOUS CORONARY STENT INTERVENTION (PCI-S);  Surgeon: Sanda Klein, MD;  Location: [facility] CATH LAB;  Service: Cardiovascular;;     Allergies  Allergen Reactions  . Cardizem Cd [Diltiazem Hcl Er Beads] Other (See Comments)    Makes heart  skip      Family History  Problem Relation Age of Onset  . Heart attack Mother   . Heart attack Brother      Social History Mr. Goralczyk reports that he has been smoking Cigarettes.  He started smoking about 55 years ago. He has a 40 pack-year smoking history. He has never used smokeless tobacco. Mr. Mckeen reports that he does not drink alcohol.   Review of Systems CONSTITUTIONAL: No weight loss, fever, chills, weakness or fatigue.  HEENT: Eyes: No visual loss, blurred vision, double vision or yellow sclerae.No hearing loss, sneezing, congestion, runny nose or sore throat.  SKIN: No rash or itching.  CARDIOVASCULAR: per HPI RESPIRATORY: No shortness of breath, cough or sputum.  GASTROINTESTINAL: dark tarry  stools GENITOURINARY: No burning on urination, no polyuria NEUROLOGICAL: No headache, dizziness, syncope, paralysis, ataxia, numbness or tingling in the extremities. No change in bowel or bladder control.  MUSCULOSKELETAL: No muscle, back pain, joint pain or stiffness.  LYMPHATICS: No enlarged nodes. No history of splenectomy.  PSYCHIATRIC: No history of depression or anxiety.  ENDOCRINOLOGIC: No reports of sweating, cold or heat intolerance. No polyuria or polydipsia.  Marland Kitchen   Physical Examination Filed Vitals:   05/26/14 0922  BP: 153/83  Pulse: 55  Temp: 97.7 F (36.5 C)   Filed Weights   05/26/14 0922  Weight: 239 lb (108.41 kg)    Gen: resting comfortably, no acute distress HEENT: no scleral icterus, pupils equal round and reactive, no palptable cervical adenopathy,  CV: RRR, no m/r/g, no +left carotid bruit Resp: Clear to auscultation bilaterally GI: abdomen is soft, non-tender, non-distended, normal bowel sounds, no hepatosplenomegaly MSK: extremities are warm, no edema.  Skin: warm, no rash Neuro:  no focal deficits Psych: appropriate affect   Diagnostic Studies  01/31/14 Cath Angiographic Findings:  1. The left main coronary artery is not stenotic, but exhibits significant atherosclerosis and bifurcates in the usual fashion into the left anterior descending artery and left circumflex coronary artery.  2. The left anterior descending artery is a large vessel that reaches the apex and generates several proximal septals, but is occluded before any of the major diagonal branches. There is evidence of extensive luminal irregularities and mild calcification.  3. The left circumflex coronary artery is a medium-size vessel non dominant vessel that generates two major oblique marginal arteries. There is evidence of extensive luminal irregularities and mild calcification. No hemodynamically meaningful stenoses are seen. The first OM is flush occluded. The distal OM is medium in  size.  4. The right coronary artery is a very large-size dominant vessel that generates a posterior lateral ventricular artery that is proximally occluded as well as a diffusely diseased PDA. There is evidence of extensive luminal irregularities and mild calcification.  5. The sequential SVG to the diagonal and first OM is very irregular in caliber, but widely patent, without stenoses. The first diagonal artery is severely diseased and small in caliber. The OM Katrina Daddona is large and free of major stenoses.  6. The SVG to the second diagonal is widely patent and healthy, feeding a relatively large vessel.  7. The LIMA to the LAD is widely patent with both retrograde and antegrade LAD flow. There are significant stenoses both upstream and downstream of the anastomosis, but there is excellent flow.  8. The SVG to RCA-PLA has a severe, ulcerated stenosis in its distal third, at least 95% in severity, but with TIMI 3 flow. It is the culprit lesion.  5. The  left ventricle was not injected.There is no aortic valve stenosis by pullback. The left ventricular end-diastolic pressure is 24 mm Hg.   IMPRESSIONS:  High grade unstable stenosis in the SVG to PLA causing NSTEMI  RECOMMENDATION:  Urgent PCI, preferably with filter wire.   IMPRESSIONS:  1. Successful PCI of the SVG to right coronary artery graft with a 3.0 x 23 Alpine drug-eluting stent - dilated to 3.25 mm in diameter, using a distal embolic protection device, 4.0 spider. 2.    Assessment and Plan  1. CAD - recent DES to SVG-RVA 01/30/14 - denies any current symptoms - continue DAPT until 01/2015  2. Dark tarry stools/Melena - describes stools suggestive of melena, he is on DAPT for recent DES in 01/2014 - will need CBC, he gets labs at [facility] and will go Wednesday for a same day primary appointment and labs. He is to call us with results.  - counseled not to stop DAPT without discussing with [place]    F/u 08/2014   Arnoldo Lenis,  M.D.

## 2014-06-10 ENCOUNTER — Telehealth: Payer: Self-pay | Admitting: *Deleted

## 2014-06-10 NOTE — Telephone Encounter (Signed)
Forwarded scanned labs from [place] [facility] to Dr. Harl Bowie

## 2014-06-18 ENCOUNTER — Ambulatory Visit: Payer: Self-pay | Admitting: Cardiology

## 2014-08-27 ENCOUNTER — Encounter: Payer: Self-pay | Admitting: Cardiology

## 2014-08-27 ENCOUNTER — Encounter: Payer: Self-pay | Admitting: *Deleted

## 2014-08-27 ENCOUNTER — Ambulatory Visit (INDEPENDENT_AMBULATORY_CARE_PROVIDER_SITE_OTHER): Payer: Medicare Other | Admitting: Cardiology

## 2014-08-27 VITALS — BP 149/65 | HR 50 | Ht 69.0 in | Wt 231.0 lb

## 2014-08-27 DIAGNOSIS — R011 Cardiac murmur, unspecified: Secondary | ICD-10-CM | POA: Diagnosis not present

## 2014-08-27 DIAGNOSIS — I2510 Atherosclerotic heart disease of native coronary artery without angina pectoris: Secondary | ICD-10-CM | POA: Diagnosis not present

## 2014-08-27 DIAGNOSIS — I10 Essential (primary) hypertension: Secondary | ICD-10-CM | POA: Diagnosis not present

## 2014-08-27 DIAGNOSIS — E785 Hyperlipidemia, unspecified: Secondary | ICD-10-CM

## 2014-08-27 NOTE — Progress Notes (Signed)
Clinical Summary [name] is a 69 y.o.male seen today for follow up of the following medical problems.   1. CAD - hx of CABG in Jan 2008 (LIMA-LAD, SVG-D2, SVG-OM, SVG-RPL) - admit 01/30/14 [name] with NSTEMI, received DES to SVG-RCA. No LV gram. No recent echo in our system.  - compliant with meds including ASA and brillinta - no recent chest pain or SOB.     2. OSA screen - fatigue ongoing for approx 4-5 months, feels tired. +snoring. - awaiting scheduling at [facility] for sleep study  3. Claudication - has referral to vascular at [facility], reports recent ABIs  4. Dark stools - improving since last visit, he is followed by a pcp at the [place]. We had checked a cbc after last visit and he did not have any significant anemia.   5. HTN - does not check reuglarly at home - compliant with meds  6. Bladder CA - followed at [facility]  7. Hyperilipidemia -compliant with statin, most recent labs in [place] system.   Past Medical History  Diagnosis Date  . Coronary artery disease     Artery bypass graft Jan 2008  . Hyperlipidemia   . Hypertension   . Tobacco user   . Acute myocardial infarction of other inferior wall, initial episode of care      Allergies  Allergen Reactions  . Cardizem Cd [Diltiazem Hcl Er Beads] Other (See Comments)    Makes heart skip     Current Outpatient Prescriptions  Medication Sig Dispense Refill  . amLODipine (NORVASC) 10 MG tablet Take 10 mg by mouth daily.    Marland Kitchen aspirin 81 MG chewable tablet Chew 1 tablet (81 mg total) by mouth daily.    Marland Kitchen atorvastatin (LIPITOR) 80 MG tablet Take 1 tablet (80 mg total) by mouth daily. 30 tablet 6  . hydrochlorothiazide (HYDRODIURIL) 25 MG tablet Take 25 mg by mouth daily.    Marland Kitchen lisinopril (PRINIVIL,ZESTRIL) 40 MG tablet Take 20 mg by mouth daily. Takes 1/2 tablet daily    . metoprolol succinate (TOPROL-XL) 100 MG 24 hr tablet Take 50 mg by mouth daily. Takes half tablet daily    . nitroGLYCERIN (NITROSTAT) 0.4 MG SL tablet  Place 1 tablet (0.4 mg total) under the tongue every 5 (five) minutes x 3 doses as needed for chest pain. 30 tablet 12  . Omega-3 Fatty Acids (RA FISH OIL) 1000 MG CAPS Take 2 capsules by mouth 2 (two) times daily.     Marland Kitchen omeprazole (PRILOSEC) 20 MG capsule Take 40 mg by mouth daily.     . ticagrelor (BRILINTA) 90 MG TABS tablet Take 1 tablet (90 mg total) by mouth 2 (two) times daily. 60 tablet 12   No current facility-administered medications for this visit.     Past Surgical History  Procedure Laterality Date  . Coronary artery bypass graft  06/14/2006    x 5  . Appendectomy    . Hernia repair    . Foot surgery    . Nose surgery    . Left heart catheterization with coronary/graft angiogram N/A 01/31/2014    Procedure: LEFT HEART CATHETERIZATION WITH Beatrix Fetters;  Surgeon: Sanda Klein, MD;  Location: [facility] CATH LAB;  Service: Cardiovascular;  Laterality: N/A;  . Percutaneous coronary stent intervention (pci-s)  01/31/2014    Procedure: PERCUTANEOUS CORONARY STENT INTERVENTION (PCI-S);  Surgeon: Sanda Klein, MD;  Location: [facility] CATH LAB;  Service: Cardiovascular;;     Allergies  Allergen Reactions  .  Cardizem Cd [Diltiazem Hcl Er Beads] Other (See Comments)    Makes heart skip      Family History  Problem Relation Age of Onset  . Heart attack Mother   . Heart attack Brother      Social History [name] reports that he has been smoking Cigarettes.  He started smoking about 55 years ago. He has a 40 pack-year smoking history. He has never used smokeless tobacco. [name] reports that he does not drink alcohol.   Review of Systems CONSTITUTIONAL: No weight loss, fever, chills, weakness or fatigue.  HEENT: Eyes: No visual loss, blurred vision, double vision or yellow sclerae.No hearing loss, sneezing, congestion, runny nose or sore throat.  SKIN: No rash or itching.  CARDIOVASCULAR: per HPI RESPIRATORY: No shortness of breath, cough or sputum.    GASTROINTESTINAL: No anorexia, nausea, vomiting or diarrhea. Occas dark stools GENITOURINARY: No burning on urination, no polyuria NEUROLOGICAL: No headache, dizziness, syncope, paralysis, ataxia, numbness or tingling in the extremities. No change in bowel or bladder control.  MUSCULOSKELETAL: No muscle, back pain, joint pain or stiffness.  LYMPHATICS: No enlarged nodes. No history of splenectomy.  PSYCHIATRIC: No history of depression or anxiety.  ENDOCRINOLOGIC: No reports of sweating, cold or heat intolerance. No polyuria or polydipsia.  Marland Kitchen   Physical Examination p 50 bp 150/60 Wt 231 lbs BMI 34 Gen: resting comfortably, no acute distress HEENT: no scleral icterus, pupils equal round and reactive, no palptable cervical adenopathy,  CV: RRR, 3/6 systolic murmur RUSB, no JVD, no carotid bruits Resp: Clear to auscultation bilaterally GI: abdomen is soft, non-tender, non-distended, normal bowel sounds, no hepatosplenomegaly MSK: extremities are warm, no edema.  Skin: warm, no rash Neuro:  no focal deficits Psych: appropriate affect   Diagnostic Studies 01/31/14 Cath Angiographic Findings:  1. The left main coronary artery is not stenotic, but exhibits significant atherosclerosis and bifurcates in the usual fashion into the left anterior descending artery and left circumflex coronary artery.  2. The left anterior descending artery is a large vessel that reaches the apex and generates several proximal septals, but is occluded before any of the major diagonal branches. There is evidence of extensive luminal irregularities and mild calcification.  3. The left circumflex coronary artery is a medium-size vessel non dominant vessel that generates two major oblique marginal arteries. There is evidence of extensive luminal irregularities and mild calcification. No hemodynamically meaningful stenoses are seen. The first OM is flush occluded. The distal OM is medium in size.  4. The right  coronary artery is a very large-size dominant vessel that generates a posterior lateral ventricular artery that is proximally occluded as well as a diffusely diseased PDA. There is evidence of extensive luminal irregularities and mild calcification.  5. The sequential SVG to the diagonal and first OM is very irregular in caliber, but widely patent, without stenoses. The first diagonal artery is severely diseased and small in caliber. The OM [name] is large and free of major stenoses.  6. The SVG to the second diagonal is widely patent and healthy, feeding a relatively large vessel.  7. The LIMA to the LAD is widely patent with both retrograde and antegrade LAD flow. There are significant stenoses both upstream and downstream of the anastomosis, but there is excellent flow.  8. The SVG to RCA-PLA has a severe, ulcerated stenosis in its distal third, at least 95% in severity, but with TIMI 3 flow. It is the culprit lesion.  5. The left ventricle was  not injected.There is no aortic valve stenosis by pullback. The left ventricular end-diastolic pressure is 24 mm Hg.   IMPRESSIONS:  High grade unstable stenosis in the SVG to PLA causing NSTEMI  RECOMMENDATION:  Urgent PCI, preferably with filter wire.   IMPRESSIONS:  1. Successful PCI of the SVG to right coronary artery graft with a 3.0 x 23 Alpine drug-eluting stent - dilated to 3.25 mm in diameter, using a distal embolic protection device, 4.0 spider. 2.          04/2014 Carotid US Moderate bilateral stenosis  12/2011 Abdominal US No aortic aneurysm   Jan 2013 MUGA LVEF 65%  Assessment and Plan  1. CAD - DES to SVG-RVA 01/30/14, remains on DAPT - denies any current symptoms - continue DAPT until 01/2015  2. Dark tarry stools - followed by pcp, most recent labs in our system with fairly normal Hgb  3. HTN - elevated in clinic. I have asked him to keep bp log until his upcoming appointment with his pcp  4. Hyperlipidemia -  request [facility] recent [facility], continue current statin  5. Claudication - followed by [facility] vascular  6. Heart murmur - he needs echo, he prefers to have it done at [place]. I have given him a note to provide to his pcp for the order  7. OSA screen - signs and symptoms of OSA, I have also provided a note to consider sleep testing at the [place] to give to his pcp.   F/u 01/2015   Arnoldo Lenis, M.D.

## 2014-08-27 NOTE — Patient Instructions (Signed)
Your physician wants you to follow-up in: [place] DR. BRANCH You will receive a reminder letter in the mail two months in advance. If you don't receive a letter, please call our office to schedule the follow-up appointment.  Your physician recommends that you continue on your current medications as directed. Please refer to the Current Medication list given to you today.  WE WILL REQUEST RECORDS FROM [facility]  WE HAVE GIVEN YOU A LETTER OF RECOMMENDING TESTING [facility] DONE BY [facility].  Thank you for choosing [facility]!!

## 2015-01-27 ENCOUNTER — Encounter: Payer: Self-pay | Admitting: Cardiology

## 2015-01-27 ENCOUNTER — Ambulatory Visit (INDEPENDENT_AMBULATORY_CARE_PROVIDER_SITE_OTHER): Payer: Medicare Other | Admitting: Cardiology

## 2015-01-27 VITALS — BP 158/76 | HR 61 | Ht 69.0 in | Wt 230.0 lb

## 2015-01-27 DIAGNOSIS — I2510 Atherosclerotic heart disease of native coronary artery without angina pectoris: Secondary | ICD-10-CM | POA: Diagnosis not present

## 2015-01-27 DIAGNOSIS — I10 Essential (primary) hypertension: Secondary | ICD-10-CM | POA: Diagnosis not present

## 2015-01-27 DIAGNOSIS — E785 Hyperlipidemia, unspecified: Secondary | ICD-10-CM | POA: Diagnosis not present

## 2015-01-27 NOTE — Patient Instructions (Signed)
Your physician wants you to follow-up in: 6 months Dr. Bryna Colander will receive a reminder letter in the mail two months in advance. If you don't receive a letter, please call our office to schedule the follow-up appointment.  Your physician has recommended you make the following change in your medication:   STOP [facility] September 18TH   Your physician has requested that you regularly monitor and record your blood pressure readings at home 1 WEEK AND CALL INTO OFFICE. Please use the same machine at the same time of day to check your readings and record them to bring to your follow-up visit.  Thank you for choosing [facility]!!

## 2015-01-27 NOTE — Progress Notes (Signed)
Patient ID: [name], male   DOB: 08/08/1945, 69 y.o.   MRN: [id number]     Clinical Summary [name] is a 69 y.o.male seen today for follow up of the following medical problems.   1. CAD - hx of CABG in Jan 2008 (LIMA-LAD, SVG-D2, SVG-OM, SVG-RPL) - admit 01/30/14 [name] with NSTEMI, received DES to SVG-RCA. No LV gram.  - 08/2014 echo from [place] LVEF 60-65%  - compliant with meds including ASA and brillinta  - denies any chest pain, no SOB or DOE  2. OSA screen - positive sleep test at [facility], has CPAP pending through the [place]  3. Claudication - followed by vascular at the [facility]  4. HTN - does not check reuglarly at home - compliant with meds,  5. Bladder CA - followed at [facility]  6. Hyperilipidemia -compliant with statin, most recent labs in [place] system.  01/16/15 TC 161 TG 308 HDL 27 LDL 72 Past Medical History  Diagnosis Date  . Coronary artery disease     Artery bypass graft Jan 2008  . Hyperlipidemia   . Hypertension   . Tobacco user   . Acute myocardial infarction of other inferior wall, initial episode of care      Allergies  Allergen Reactions  . Cardizem Cd [Diltiazem Hcl Er Beads] Other (See Comments)    Makes heart skip     Current Outpatient Prescriptions  Medication Sig Dispense Refill  . amLODipine (NORVASC) 10 MG tablet Take 10 mg by mouth daily.    Marland Kitchen aspirin 81 MG chewable tablet Chew 1 tablet (81 mg total) by mouth daily.    Marland Kitchen atorvastatin (LIPITOR) 80 MG tablet Take 1 tablet (80 mg total) by mouth daily. 30 tablet 6  . ergocalciferol (VITAMIN D2) 50000 UNITS capsule Take 50,000 Units by mouth once a week.    . hydrochlorothiazide (HYDRODIURIL) 25 MG tablet Take 25 mg by mouth daily.    Marland Kitchen lisinopril (PRINIVIL,ZESTRIL) 40 MG tablet Take 20 mg by mouth daily. Takes 1/2 tablet daily    . metoprolol succinate (TOPROL-XL) 100 MG 24 hr tablet Take 50 mg by mouth daily. Takes half tablet daily    . nitroGLYCERIN (NITROSTAT) 0.4 MG SL tablet Place 1 tablet  (0.4 mg total) under the tongue every 5 (five) minutes x 3 doses as needed for chest pain. 30 tablet 12  . Omega-3 Fatty Acids (RA FISH OIL) 1000 MG CAPS Take 2 capsules by mouth 2 (two) times daily.     Marland Kitchen omeprazole (PRILOSEC) 20 MG capsule Take 40 mg by mouth daily.     . ticagrelor (BRILINTA) 90 MG TABS tablet Take 1 tablet (90 mg total) by mouth 2 (two) times daily. 60 tablet 12   No current facility-administered medications for this visit.     Past Surgical History  Procedure Laterality Date  . Coronary artery bypass graft  06/14/2006    x 5  . Appendectomy    . Hernia repair    . Foot surgery    . Nose surgery    . Left heart catheterization with coronary/graft angiogram N/A 01/31/2014    Procedure: LEFT HEART CATHETERIZATION WITH Beatrix Fetters;  Surgeon: Sanda Klein, MD;  Location: [facility] CATH LAB;  Service: Cardiovascular;  Laterality: N/A;  . Percutaneous coronary stent intervention (pci-s)  01/31/2014    Procedure: PERCUTANEOUS CORONARY STENT INTERVENTION (PCI-S);  Surgeon: Sanda Klein, MD;  Location: [facility] CATH LAB;  Service: Cardiovascular;;     Allergies  Allergen Reactions  .  Cardizem Cd [Diltiazem Hcl Er Beads] Other (See Comments)    Makes heart skip      Family History  Problem Relation Age of Onset  . Heart attack Mother   . Heart attack Brother      Social History [name] reports that he has been smoking Cigarettes.  He started smoking about 55 years ago. He has a 40 pack-year smoking history. He has never used smokeless tobacco. [name] reports that he does not drink alcohol.   Review of Systems CONSTITUTIONAL: No weight loss, fever, chills, weakness or fatigue.  HEENT: Eyes: No visual loss, blurred vision, double vision or yellow sclerae.No hearing loss, sneezing, congestion, runny nose or sore throat.  SKIN: No rash or itching.  CARDIOVASCULAR: per HPI RESPIRATORY: No shortness of breath, cough or sputum.  GASTROINTESTINAL: No  anorexia, nausea, vomiting or diarrhea. No abdominal pain or blood.  GENITOURINARY: No burning on urination, no polyuria NEUROLOGICAL: No headache, dizziness, syncope, paralysis, ataxia, numbness or tingling in the extremities. No change in bowel or bladder control.  MUSCULOSKELETAL: No muscle, back pain, joint pain or stiffness.  LYMPHATICS: No enlarged nodes. No history of splenectomy.  PSYCHIATRIC: No history of depression or anxiety.  ENDOCRINOLOGIC: No reports of sweating, cold or heat intolerance. No polyuria or polydipsia.  Marland Kitchen   Physical Examination Filed Vitals:   01/27/15 0808  BP: 158/76  Pulse: 61   Filed Vitals:   01/27/15 0808  Height: 5\' 9"  (1.753 m)  Weight: 230 lb (104.327 kg)    Gen: resting comfortably, no acute distress HEENT: no scleral icterus, pupils equal round and reactive, no palptable cervical adenopathy,  CV: RRR, 2/6 systolic murmur RUSB, no JVD Resp: Clear to auscultation bilaterally GI: abdomen is soft, non-tender, non-distended, normal bowel sounds, no hepatosplenomegaly MSK: extremities are warm, no edema.  Skin: warm, no rash Neuro:  no focal deficits Psych: appropriate affect   Diagnostic Studies 01/31/14 Cath Angiographic Findings:  1. The left main coronary artery is not stenotic, but exhibits significant atherosclerosis and bifurcates in the usual fashion into the left anterior descending artery and left circumflex coronary artery.  2. The left anterior descending artery is a large vessel that reaches the apex and generates several proximal septals, but is occluded before any of the major diagonal branches. There is evidence of extensive luminal irregularities and mild calcification.  3. The left circumflex coronary artery is a medium-size vessel non dominant vessel that generates two major oblique marginal arteries. There is evidence of extensive luminal irregularities and mild calcification. No hemodynamically meaningful stenoses are seen.  The first OM is flush occluded. The distal OM is medium in size.  4. The right coronary artery is a very large-size dominant vessel that generates a posterior lateral ventricular artery that is proximally occluded as well as a diffusely diseased PDA. There is evidence of extensive luminal irregularities and mild calcification.  5. The sequential SVG to the diagonal and first OM is very irregular in caliber, but widely patent, without stenoses. The first diagonal artery is severely diseased and small in caliber. The OM [name] is large and free of major stenoses.  6. The SVG to the second diagonal is widely patent and healthy, feeding a relatively large vessel.  7. The LIMA to the LAD is widely patent with both retrograde and antegrade LAD flow. There are significant stenoses both upstream and downstream of the anastomosis, but there is excellent flow.  8. The SVG to RCA-PLA has a severe, ulcerated stenosis  in its distal third, at least 95% in severity, but with TIMI 3 flow. It is the culprit lesion.  5. The left ventricle was not injected.There is no aortic valve stenosis by pullback. The left ventricular end-diastolic pressure is 24 mm Hg.   IMPRESSIONS:  High grade unstable stenosis in the SVG to PLA causing NSTEMI  RECOMMENDATION:  Urgent PCI, preferably with filter wire.   IMPRESSIONS:  1. Successful PCI of the SVG to right coronary artery graft with a 3.0 x 23 Alpine drug-eluting stent - dilated to 3.25 mm in diameter, using a distal embolic protection device, 4.0 spider. 2.          04/2014 Carotid US Moderate bilateral stenosis  12/2011 Abdominal US No aortic aneurysm   Jan 2013 [facility] LVEF 65%       08/2014 [facility] LVEF 60-65%, aortic valve sclerosis.    Assessment and Plan  1. CAD - DES to SVG-RVA 01/30/14, will stop brilinta next week - no current symptoms, continue other meds  2. HTN - elevated in clinic. I have asked him to keep bp log x 1 week and call  us with results. If elevated then titrate up meds.   3. Hyperlipidemia -LDL at goal, HDL and TGs not. Counseled on diet and exercise changes  4. Claudication - followed by [facility] vascular  5. OSA - abnormal sleep study at the [facility] per his report, awaiting CPAP machine   F/u 6 months      Arnoldo Lenis, M.D.

## 2015-02-18 ENCOUNTER — Encounter: Payer: Self-pay | Admitting: *Deleted

## 2015-05-17 DIAGNOSIS — Z9289 Personal history of other medical treatment: Secondary | ICD-10-CM

## 2015-05-17 HISTORY — DX: Personal history of other medical treatment: Z92.89

## 2015-08-13 ENCOUNTER — Ambulatory Visit (INDEPENDENT_AMBULATORY_CARE_PROVIDER_SITE_OTHER): Payer: Medicare Other | Admitting: Cardiology

## 2015-08-13 ENCOUNTER — Encounter: Payer: Self-pay | Admitting: Cardiology

## 2015-08-13 VITALS — BP 148/78 | HR 54 | Ht 69.0 in | Wt 239.0 lb

## 2015-08-13 DIAGNOSIS — I10 Essential (primary) hypertension: Secondary | ICD-10-CM

## 2015-08-13 DIAGNOSIS — I2510 Atherosclerotic heart disease of native coronary artery without angina pectoris: Secondary | ICD-10-CM | POA: Diagnosis not present

## 2015-08-13 DIAGNOSIS — E785 Hyperlipidemia, unspecified: Secondary | ICD-10-CM | POA: Diagnosis not present

## 2015-08-13 DIAGNOSIS — I6523 Occlusion and stenosis of bilateral carotid arteries: Secondary | ICD-10-CM | POA: Diagnosis not present

## 2015-08-13 MED ORDER — NITROGLYCERIN 0.4 MG SL SUBL: 0.4000 mg | SUBLINGUAL_TABLET | SUBLINGUAL | Status: DC | PRN

## 2015-08-13 MED ORDER — LISINOPRIL 40 MG PO TABS: 40.0000 mg | ORAL_TABLET | Freq: Every day | ORAL | Status: DC

## 2015-08-13 NOTE — Patient Instructions (Signed)
Your physician wants you to follow-up in: [place] DR. BRANCH You will receive a reminder letter in the mail two months in advance. If you don't receive a letter, please call our office to schedule the follow-up appointment.  Your physician has recommended you make the following change in your medication:   INCREASE LISINOPRIL 40 MG DAILY  Your physician recommends that you return for lab work BMP.MG  Thank you for choosing [facility]!!

## 2015-08-13 NOTE — Progress Notes (Signed)
Patient ID: [name], male   DOB: 11-25-1945, 70 y.o.   MRN: [id number]     Clinical Summary [name] is a 70 y.o.male seen today for follow up of the following medical problems.   1. CAD - hx of CABG in Jan 2008 (LIMA-LAD, SVG-D2, SVG-OM, SVG-RPL) - admit 01/30/14 [name] with NSTEMI, received DES to SVG-RCA. No LV gram.  - 08/2014 echo from [facility] LVEF 60-65%   - denies any chest pain since last visit. No SOB or DOE, exertion mainly limited due to chronic knee pain.  - compliant with meds  2. OSA - mixed compliance with CPAP  3. Claudication - followed by vascular at the [facility]  4. HTN - checks at home, typically 150s/70-80s - compliant with meds,  5. Bladder CA - followed at [facility]  6. Hyperilipidemia -compliant with statin, labs followed at [facility]  01/16/15 TC 161 TG 308 HDL 27 LDL 72  7. Carotid stenosis -  RICA [id number], LICA [id number].  - denies any neuro symptoms   Past Medical History  Diagnosis Date  . Coronary artery disease     Artery bypass graft Jan 2008  . Hyperlipidemia   . Hypertension   . Tobacco user   . Acute myocardial infarction of other inferior wall, initial episode of care      Allergies  Allergen Reactions  . Cardizem Cd [Diltiazem Hcl Er Beads] Other (See Comments)    Makes heart skip     Current Outpatient Prescriptions  Medication Sig Dispense Refill  . amLODipine (NORVASC) 10 MG tablet Take 10 mg by mouth daily.    Marland Kitchen aspirin 81 MG chewable tablet Chew 1 tablet (81 mg total) by mouth daily.    Marland Kitchen atorvastatin (LIPITOR) 80 MG tablet Take 1 tablet (80 mg total) by mouth daily. 30 tablet 6  . ergocalciferol (VITAMIN D2) 50000 UNITS capsule Take 50,000 Units by mouth once a week.    . hydrochlorothiazide (HYDRODIURIL) 25 MG tablet Take 25 mg by mouth daily.    Marland Kitchen lisinopril (PRINIVIL,ZESTRIL) 40 MG tablet Take 20 mg by mouth daily. Takes 1/2 tablet daily    . metoprolol succinate (TOPROL-XL) 100 MG 24 hr tablet Take 50 mg by mouth daily.  Takes half tablet daily    . nitroGLYCERIN (NITROSTAT) 0.4 MG SL tablet Place 1 tablet (0.4 mg total) under the tongue every 5 (five) minutes x 3 doses as needed for chest pain. 30 tablet 12  . Omega-3 Fatty Acids (RA FISH OIL) 1000 MG CAPS Take 2 capsules by mouth 2 (two) times daily.     Marland Kitchen omeprazole (PRILOSEC) 20 MG capsule Take 40 mg by mouth daily.     . ticagrelor (BRILINTA) 90 MG TABS tablet Take 1 tablet (90 mg total) by mouth 2 (two) times daily. (Patient taking differently: Take 90 mg by mouth 2 (two) times daily. PT IS TO STOP TAKING 02/01/15) 60 tablet 12   No current facility-administered medications for this visit.     Past Surgical History  Procedure Laterality Date  . Coronary artery bypass graft  06/14/2006    x 5  . Appendectomy    . Hernia repair    . Foot surgery    . Nose surgery    . Left heart catheterization with coronary/graft angiogram N/A 01/31/2014    Procedure: LEFT HEART CATHETERIZATION WITH [name];  Surgeon: Mark Klein, MD;  Location: [facility] CATH LAB;  Service: Cardiovascular;  Laterality: N/A;  . Percutaneous coronary stent  intervention (pci-s)  01/31/2014    Procedure: PERCUTANEOUS CORONARY STENT INTERVENTION (PCI-S);  Surgeon: Mark Klein, MD;  Location: [facility] CATH LAB;  Service: Cardiovascular;;     Allergies  Allergen Reactions  . Cardizem Cd [Diltiazem Hcl Er Beads] Other (See Comments)    Makes heart skip      Family History  Problem Relation Age of Onset  . Heart attack Mother   . Heart attack Brother      Social History [name] reports that he has been smoking Cigarettes.  He started smoking about 56 years ago. He has a 40 pack-year smoking history. He has never used smokeless tobacco. [name] reports that he does not drink alcohol.   Review of Systems CONSTITUTIONAL: No weight loss, fever, chills, weakness or fatigue.  HEENT: Eyes: No visual loss, blurred vision, double vision or yellow sclerae.No hearing  loss, sneezing, congestion, runny nose or sore throat.  SKIN: No rash or itching.  CARDIOVASCULAR: per HPI RESPIRATORY: No shortness of breath, cough or sputum.  GASTROINTESTINAL: No anorexia, nausea, vomiting or diarrhea. No abdominal pain or blood.  GENITOURINARY: No burning on urination, no polyuria NEUROLOGICAL: No headache, dizziness, syncope, paralysis, ataxia, numbness or tingling in the extremities. No change in bowel or bladder control.  MUSCULOSKELETAL: No muscle, back pain, joint pain or stiffness.  LYMPHATICS: No enlarged nodes. No history of splenectomy.  PSYCHIATRIC: No history of depression or anxiety.  ENDOCRINOLOGIC: No reports of sweating, cold or heat intolerance. No polyuria or polydipsia.  Marland Kitchen   Physical Examination Filed Vitals:   08/13/15 0830  BP: 148/78  Pulse: 54   Filed Vitals:   08/13/15 0830  Height: 5\' 9"  (1.753 m)  Weight: 239 lb (108.41 kg)    Gen: resting comfortably, no acute distress HEENT: no scleral icterus, pupils equal round and reactive, no palptable cervical adenopathy,  CV: RRR, no m/r/g, no jvd Resp: Clear to auscultation bilaterally GI: abdomen is soft, non-tender, non-distended, normal bowel sounds, no hepatosplenomegaly MSK: extremities are warm, no edema.  Skin: warm, no rash Neuro:  no focal deficits Psych: appropriate affect   Diagnostic Studies 01/31/14 Cath Angiographic Findings:  1. The left main coronary artery is not stenotic, but exhibits significant atherosclerosis and bifurcates in the usual fashion into the left anterior descending artery and left circumflex coronary artery.  2. The left anterior descending artery is a large vessel that reaches the apex and generates several proximal septals, but is occluded before any of the major diagonal branches. There is evidence of extensive luminal irregularities and mild calcification.  3. The left circumflex coronary artery is a medium-size vessel non dominant vessel that  generates two major oblique marginal arteries. There is evidence of extensive luminal irregularities and mild calcification. No hemodynamically meaningful stenoses are seen. The first OM is flush occluded. The distal OM is medium in size.  4. The right coronary artery is a very large-size dominant vessel that generates a posterior lateral ventricular artery that is proximally occluded as well as a diffusely diseased PDA. There is evidence of extensive luminal irregularities and mild calcification.  5. The sequential SVG to the diagonal and first OM is very irregular in caliber, but widely patent, without stenoses. The first diagonal artery is severely diseased and small in caliber. The OM Ardit Danh is large and free of major stenoses.  6. The SVG to the second diagonal is widely patent and healthy, feeding a relatively large vessel.  7. The LIMA to the LAD is widely patent  with both retrograde and antegrade LAD flow. There are significant stenoses both upstream and downstream of the anastomosis, but there is excellent flow.  8. The SVG to RCA-PLA has a severe, ulcerated stenosis in its distal third, at least 95% in severity, but with TIMI 3 flow. It is the culprit lesion.  5. The left ventricle was not injected.There is no aortic valve stenosis by pullback. The left ventricular end-diastolic pressure is 24 mm Hg.   IMPRESSIONS:  High grade unstable stenosis in the SVG to PLA causing NSTEMI  RECOMMENDATION:  Urgent PCI, preferably with filter wire.   IMPRESSIONS:  1. Successful PCI of the SVG to right coronary artery graft with a 3.0 x 23 Alpine drug-eluting stent - dilated to 3.25 mm in diameter, using a distal embolic protection device, 4.0 spider. 2.          04/2014 Carotid US Moderate bilateral stenosis  12/2011 Abdominal US No aortic aneurysm   Jan 2013 [facility] LVEF 65%       08/2014 [facility] LVEF 60-65%, aortic valve sclerosis.              Assessment and  Plan   1. CAD - no current symptoms, will continue current meds  2. HTN - elevated in clinic. We will increase lisonpril to 40mg  daily, repeat BMET in 2 weeks  3. Hyperlipidemia -request labs from [place]. Continue current meds  4. Claudication - followed by [facility] vascular  5. OSA - compliant with CPAP  6. Carotid stenosis - mild to moderate by recent US, will repat later this year  F/u 6 months     Arnoldo Lenis, M.D.

## 2015-11-12 ENCOUNTER — Encounter: Payer: Self-pay | Admitting: *Deleted

## 2015-12-01 ENCOUNTER — Telehealth: Payer: Self-pay | Admitting: Cardiology

## 2015-12-04 NOTE — Telephone Encounter (Signed)
Information faxed to [facility].

## 2015-12-04 NOTE — Telephone Encounter (Signed)
From cardiac standpoint ok to proceed with EGD  Zandra Abts MD

## 2015-12-21 ENCOUNTER — Telehealth: Payer: Self-pay | Admitting: Cardiology

## 2015-12-21 NOTE — Telephone Encounter (Signed)
Patient received a letter from [place] clinic stating that they have not received information to be able to proceed with procedure

## 2015-12-22 NOTE — Telephone Encounter (Signed)
Originally sent clearance 12/01/15. Spoke with pt who says [facility] says they have not received clearance. Faxed again today.

## 2016-02-12 ENCOUNTER — Ambulatory Visit (INDEPENDENT_AMBULATORY_CARE_PROVIDER_SITE_OTHER): Payer: Medicare Other | Admitting: Cardiology

## 2016-02-12 ENCOUNTER — Encounter: Payer: Self-pay | Admitting: Cardiology

## 2016-02-12 VITALS — BP 144/62 | HR 50 | Ht 69.0 in | Wt 216.0 lb

## 2016-02-12 DIAGNOSIS — I10 Essential (primary) hypertension: Secondary | ICD-10-CM | POA: Diagnosis not present

## 2016-02-12 DIAGNOSIS — I2510 Atherosclerotic heart disease of native coronary artery without angina pectoris: Secondary | ICD-10-CM | POA: Diagnosis not present

## 2016-02-12 DIAGNOSIS — R0789 Other chest pain: Secondary | ICD-10-CM

## 2016-02-12 DIAGNOSIS — E782 Mixed hyperlipidemia: Secondary | ICD-10-CM

## 2016-02-12 NOTE — Progress Notes (Signed)
Clinical Summary [name] is a 70 y.o.male seen today for follow up of the following medical problems.   1. CAD - hx of CABG in Jan 2008 (LIMA-LAD, SVG-D2, SVG-OM, SVG-RPL) - admit [facility]/14 [name] with NSTEMI, received DES to SVG-RCA. No LV gram.  - 08/2014 echo from [place] LVEF 60-65%   - has had some recent burning in chest.  - had nuclear stress 2 months at [facility]. Large moderate lateral defect fixed. 11/2015.  - burning sensation midupper chest. 5/10, progresses with exertion. Has significant low energy, fatigue. Minimal activity causes significant SOB and fatigue.  2. OSA - mixed compliance with CPAP  3. Claudication - followed by vascular at the [facility]  4. HTN - checks at home, typically 150s/70-80s - compliant with meds,  - last visit increased lisinopril to 40mg  daily.   5. Bladder CA - followed at [facility]  6. Hyperilipidemia -compliant with statin, labs followed at [facility]  01/16/15 TC 161 TG 308 HDL 27 LDL 72  7. Carotid stenosis -  RICA 54%, LICA <27% 10/2374.  - denies any neuro symptoms  8. Chest pain - episode of coughing up blood Nov 2016. Seen at [facility].  - was having some increased SOB at that time - 08/2015 episode of severe chest pain awoke from sleep. Better with belching.  - symptoms improved with change in diet.  - recent normal EGD.    Past Medical History:  Diagnosis Date  . Acute myocardial infarction of other inferior wall, initial episode of care   . Coronary artery disease    Artery bypass graft Jan 2008  . Hyperlipidemia   . Hypertension   . Tobacco user      Allergies  Allergen Reactions  . Cardizem Cd [Diltiazem Hcl Er Beads] Other (See Comments)    Makes heart skip     Current Outpatient Prescriptions  Medication Sig Dispense Refill  . amLODipine (NORVASC) 10 MG tablet Take 10 mg by mouth daily.    Marland Kitchen aspirin 81 MG chewable tablet Chew 1 tablet (81 mg total) by mouth daily.    Marland Kitchen atorvastatin (LIPITOR) 80 MG tablet Take 1  tablet (80 mg total) by mouth daily. 30 tablet 6  . ergocalciferol (VITAMIN D2) 50000 UNITS capsule Take 50,000 Units by mouth once a week.    . hydrochlorothiazide (HYDRODIURIL) 25 MG tablet Take 25 mg by mouth daily.    Marland Kitchen lisinopril (PRINIVIL,ZESTRIL) 40 MG tablet Take 1 tablet (40 mg total) by mouth daily. 90 tablet 3  . metoprolol succinate (TOPROL-XL) 100 MG 24 hr tablet Take 50 mg by mouth daily. Takes half tablet daily    . nitroGLYCERIN (NITROSTAT) 0.4 MG SL tablet Place 1 tablet (0.4 mg total) under the tongue every 5 (five) minutes x 3 doses as needed for chest pain. 25 tablet 3  . Omega-3 Fatty Acids (RA FISH OIL) 1000 MG CAPS Take 2 capsules by mouth 2 (two) times daily.     Marland Kitchen omeprazole (PRILOSEC) 20 MG capsule Take 40 mg by mouth daily.      No current facility-administered medications for this visit.      Past Surgical History:  Procedure Laterality Date  . APPENDECTOMY    . CORONARY ARTERY BYPASS GRAFT  06/14/2006   x 5  . FOOT SURGERY    . HERNIA REPAIR    . LEFT HEART CATHETERIZATION WITH CORONARY/GRAFT ANGIOGRAM N/A 01/31/2014   Procedure: LEFT HEART CATHETERIZATION WITH CORONARY/GRAFT ANGIOGRAM;  Surgeon: Sanda Klein,  MD;  Location: [facility] CATH LAB;  Service: Cardiovascular;  Laterality: N/A;  . NOSE SURGERY    . PERCUTANEOUS CORONARY STENT INTERVENTION (PCI-S)  01/31/2014   Procedure: PERCUTANEOUS CORONARY STENT INTERVENTION (PCI-S);  Surgeon: Sanda Klein, MD;  Location: [facility] CATH LAB;  Service: Cardiovascular;;     Allergies  Allergen Reactions  . Cardizem Cd [Diltiazem Hcl Er Beads] Other (See Comments)    Makes heart skip      Family History  Problem Relation Age of Onset  . Heart attack Mother   . Heart attack Brother      Social History [name] reports that he has been smoking Cigarettes.  He started smoking about 57 years ago. He has a 40.00 pack-year smoking history. He has never used smokeless tobacco. [name] reports that he does not drink  alcohol.   Review of Systems CONSTITUTIONAL: No weight loss, fever, chills, weakness or fatigue.  HEENT: Eyes: No visual loss, blurred vision, double vision or yellow sclerae.No hearing loss, sneezing, congestion, runny nose or sore throat.  SKIN: No rash or itching.  CARDIOVASCULAR: per HPI RESPIRATORY: No shortness of breath, cough or sputum.  GASTROINTESTINAL: No anorexia, nausea, vomiting or diarrhea. No abdominal pain or blood.  GENITOURINARY: No burning on urination, no polyuria NEUROLOGICAL: No headache, dizziness, syncope, paralysis, ataxia, numbness or tingling in the extremities. No change in bowel or bladder control.  MUSCULOSKELETAL: No muscle, back pain, joint pain or stiffness.  LYMPHATICS: No enlarged nodes. No history of splenectomy.  PSYCHIATRIC: No history of depression or anxiety.  ENDOCRINOLOGIC: No reports of sweating, cold or heat intolerance. No polyuria or polydipsia.  Marland Kitchen   Physical Examination Vitals:   02/12/16 0818  BP: (!) 144/62  Pulse: (!) 50   Vitals:   02/12/16 0818  Weight: 216 lb (98 kg)  Height: 5\' 9"  (1.753 m)    Gen: resting comfortably, no acute distress HEENT: no scleral icterus, pupils equal round and reactive, no palptable cervical adenopathy,  CV: RRR, no m/r/g, no jvd Resp: Clear to auscultation bilaterally GI: abdomen is soft, non-tender, non-distended, normal bowel sounds, no hepatosplenomegaly MSK: extremities are warm, no edema.  Skin: warm, no rash Neuro:  no focal deficits Psych: appropriate affect   Diagnostic Studies 01/31/14 Cath Angiographic Findings:  1. The left main coronary artery is not stenotic, but exhibits significant atherosclerosis and bifurcates in the usual fashion into the left anterior descending artery and left circumflex coronary artery.  2. The left anterior descending artery is a large vessel that reaches the apex and generates several proximal septals, but is occluded before any of the major diagonal  branches. There is evidence of extensive luminal irregularities and mild calcification.  3. The left circumflex coronary artery is a medium-size vessel non dominant vessel that generates two major oblique marginal arteries. There is evidence of extensive luminal irregularities and mild calcification. No hemodynamically meaningful stenoses are seen. The first OM is flush occluded. The distal OM is medium in size.  4. The right coronary artery is a very large-size dominant vessel that generates a posterior lateral ventricular artery that is proximally occluded as well as a diffusely diseased PDA. There is evidence of extensive luminal irregularities and mild calcification.  5. The sequential SVG to the diagonal and first OM is very irregular in caliber, but widely patent, without stenoses. The first diagonal artery is severely diseased and small in caliber. The OM Ellenor Wisniewski is large and free of major stenoses.  6. The SVG to the  second diagonal is widely patent and healthy, feeding a relatively large vessel.  7. The LIMA to the LAD is widely patent with both retrograde and antegrade LAD flow. There are significant stenoses both upstream and downstream of the anastomosis, but there is excellent flow.  8. The SVG to RCA-PLA has a severe, ulcerated stenosis in its distal third, at least 95% in severity, but with TIMI 3 flow. It is the culprit lesion.  5. The left ventricle was not injected.There is no aortic valve stenosis by pullback. The left ventricular end-diastolic pressure is 24 mm Hg.   IMPRESSIONS:  High grade unstable stenosis in the SVG to PLA causing NSTEMI  RECOMMENDATION:  Urgent PCI, preferably with filter wire.   IMPRESSIONS:  1. Successful PCI of the SVG to right coronary artery graft with a 3.0 x 23 Alpine drug-eluting stent - dilated to 3.25 mm in diameter, using a distal embolic protection device, 4.0 spider. 2.         04/2014 Carotid US Moderate bilateral  stenosis  12/2011 Abdominal US No aortic aneurysm   Jan 2013 [facility] LVEF 65%      08/2014 [facility] LVEF 60-65%, aortic valve sclerosis.       Assessment and Plan  1. CAD - recent exertional chest pain symptoms. Negative stress test, however given his history and prorgressing symptoms I have recommended a cath for him. He would prefer to go through the [place] for his cath, and will contact his [facility] provider to arrange cath. - continue current meds  2. HTN - at goal, continue current meds  3. Hyperlipidemia -request labs from [place].  Continue current statin  4. OSA - continue CPAP  5. Carotid stenosis - mild to moderate by recent US Continue to monitor  F/u 6 weeks      Arnoldo Lenis, M.D.

## 2016-02-12 NOTE — Patient Instructions (Signed)
Your physician recommends that you schedule a follow-up appointment in: [place] [address] [place]   Your physician recommends that you continue on your current medications as directed. Please refer to the Current Medication list given to you today.  Your physician has requested that you have a cardiac catheterization. Cardiac catheterization is used to diagnose and/or treat various heart conditions. Doctors may recommend this procedure for a number of different reasons. The most common reason is to evaluate chest pain. Chest pain can be a symptom of coronary artery disease (CAD), and cardiac catheterization can show whether plaque is narrowing or blocking your heart's arteries. This procedure is also used to evaluate the valves, as well as measure the blood flow and oxygen levels in different parts of your heart. For further information please visit [email or web address]. Please follow instruction sheet, as given.  WE WILL FORWARD DR BRANCH'S NOTE TO THE [facility]   Thank you for choosing [facility]!!

## 2016-03-10 ENCOUNTER — Encounter: Payer: Medicare Other | Admitting: Cardiology

## 2016-04-14 ENCOUNTER — Encounter: Payer: Self-pay | Admitting: Cardiology

## 2016-04-14 ENCOUNTER — Ambulatory Visit (INDEPENDENT_AMBULATORY_CARE_PROVIDER_SITE_OTHER): Payer: Medicare Other | Admitting: Cardiology

## 2016-04-14 VITALS — BP 142/76 | HR 52 | Ht 69.0 in | Wt 216.4 lb

## 2016-04-14 DIAGNOSIS — I2510 Atherosclerotic heart disease of native coronary artery without angina pectoris: Secondary | ICD-10-CM | POA: Diagnosis not present

## 2016-04-14 MED ORDER — ISOSORBIDE MONONITRATE ER 30 MG PO TB24
15.0000 mg | ORAL_TABLET | Freq: Every day | ORAL | 3 refills | Status: DC
Start: 2016-04-14 — End: 2016-09-24

## 2016-04-14 NOTE — Patient Instructions (Signed)
Your physician recommends that you schedule a follow-up appointment in: 3 MONTHS WITH DR. [place]  Your physician has recommended you make the following change in your medication:   START IMDUR 15 MG DAILY   Thank you for choosing [facility]!!

## 2016-04-14 NOTE — Progress Notes (Signed)
Clinical Summary Mr. Chaikin is a 70 y.o.male seen today for follow up of the following medical problems. This is a focused visit on history of CAD.   1. CAD - hx of CABG in Jan 2008 (LIMA-LAD, SVG-D2, SVG-OM, SVG-RPL) - admit [facility]/30/14 [name] with NSTEMI, received DES to SVG-RCA. No LV gram.  - 08/2014 echo from [place] LVEF 60-65%   - has had some recent burning in chest.  - had nuclear stress 2 monthsago at [facility]. Large moderate lateral defect fixed. 11/2015.  - since last visit had cath at [facility] 03/18/16. LM 20%, prox LAD 100% CTO, D2 diffuse disease, LCX LIs, OM2 80%, RCA right PL segment 90%. LIMA patent, SVG-OM2 patent, SVG-D2 50% stenosis, SVG-right PL occluded. Recs were for medical management. Fairly stable findings from prior cath however SVG-RCA PL which was previously stented is now occluded.   - no recent chest pain.     Past Medical History:  Diagnosis Date  . Acute myocardial infarction of other inferior wall, initial episode of care   . Coronary artery disease    Artery bypass graft Jan 2008  . Hyperlipidemia   . Hypertension   . Tobacco user      Allergies  Allergen Reactions  . Cardizem Cd [Diltiazem Hcl Er Beads] Other (See Comments)    Makes heart skip     Current Outpatient Prescriptions  Medication Sig Dispense Refill  . amLODipine (NORVASC) 10 MG tablet Take 10 mg by mouth daily.    Marland Kitchen aspirin 81 MG chewable tablet Chew 1 tablet (81 mg total) by mouth daily.    Marland Kitchen atorvastatin (LIPITOR) 80 MG tablet Take 1 tablet (80 mg total) by mouth daily. 30 tablet 6  . ergocalciferol (VITAMIN D2) 50000 UNITS capsule Take 50,000 Units by mouth once a week.    . hydrochlorothiazide (HYDRODIURIL) 25 MG tablet Take 25 mg by mouth daily.    Marland Kitchen lisinopril (PRINIVIL,ZESTRIL) 40 MG tablet Take 1 tablet (40 mg total) by mouth daily. 90 tablet 3  . metoprolol succinate (TOPROL-XL) 100 MG 24 hr tablet Take 50 mg by mouth daily. Takes half tablet daily    . nitroGLYCERIN  (NITROSTAT) 0.4 MG SL tablet Place 1 tablet (0.4 mg total) under the tongue every 5 (five) minutes x 3 doses as needed for chest pain. 25 tablet 3  . Omega-3 Fatty Acids (RA FISH OIL) 1000 MG CAPS Take 2 capsules by mouth 2 (two) times daily.     Marland Kitchen omeprazole (PRILOSEC) 20 MG capsule Take 40 mg by mouth daily.      No current facility-administered medications for this visit.      Past Surgical History:  Procedure Laterality Date  . APPENDECTOMY    . CORONARY ARTERY BYPASS GRAFT  06/14/2006   x 5  . FOOT SURGERY    . HERNIA REPAIR    . LEFT HEART CATHETERIZATION WITH CORONARY/GRAFT ANGIOGRAM N/A 01/31/2014   Procedure: LEFT HEART CATHETERIZATION WITH Beatrix Fetters;  Surgeon: Sanda Klein, MD;  Location: [facility] CATH LAB;  Service: Cardiovascular;  Laterality: N/A;  . NOSE SURGERY    . PERCUTANEOUS CORONARY STENT INTERVENTION (PCI-S)  01/31/2014   Procedure: PERCUTANEOUS CORONARY STENT INTERVENTION (PCI-S);  Surgeon: Sanda Klein, MD;  Location: [facility] CATH LAB;  Service: Cardiovascular;;     Allergies  Allergen Reactions  . Cardizem Cd [Diltiazem Hcl Er Beads] Other (See Comments)    Makes heart skip      Family History  Problem Relation  Age of Onset  . Heart attack Mother   . Heart attack Brother      Social History Mr. [place] reports that he has been smoking Cigarettes.  He started smoking about 57 years ago. He has a 40.00 pack-year smoking history. He has never used smokeless tobacco. Mr. Lazaro reports that he does not drink alcohol.   Review of Systems CONSTITUTIONAL: No weight loss, fever, chills, weakness or fatigue.  HEENT: Eyes: No visual loss, blurred vision, double vision or yellow sclerae.No hearing loss, sneezing, congestion, runny nose or sore throat.  SKIN: No rash or itching.  CARDIOVASCULAR: per hpi RESPIRATORY: No shortness of breath, cough or sputum.  GASTROINTESTINAL: No anorexia, nausea, vomiting or diarrhea. No abdominal pain or blood.    GENITOURINARY: No burning on urination, no polyuria NEUROLOGICAL: No headache, dizziness, syncope, paralysis, ataxia, numbness or tingling in the extremities. No change in bowel or bladder control.  MUSCULOSKELETAL: No muscle, back pain, joint pain or stiffness.  LYMPHATICS: No enlarged nodes. No history of splenectomy.  PSYCHIATRIC: No history of depression or anxiety.  ENDOCRINOLOGIC: No reports of sweating, cold or heat intolerance. No polyuria or polydipsia.  Marland Kitchen   Physical Examination Vitals:   04/14/16 1401  BP: (!) 142/76  Pulse: (!) 52   Vitals:   04/14/16 1401  Weight: 216 lb 6.4 oz (98.2 kg)  Height: 5\' 9"  (1.753 m)    Gen: resting comfortably, no acute distress HEENT: no scleral icterus, pupils equal round and reactive, no palptable cervical adenopathy,  CV: RRR, no m/r/g, no jvd Resp: Clear to auscultation bilaterally GI: abdomen is soft, non-tender, non-distended, normal bowel sounds, no hepatosplenomegaly MSK: extremities are warm, no edema.  Skin: warm, no rash Neuro:  no focal deficits Psych: appropriate affect   Diagnostic Studies 01/31/14 Cath Angiographic Findings: 1. The left main coronary arteryis not stenotic, but exhibits significant atherosclerosis and bifurcates in the usual fashion into the left anterior descending artery and left circumflex coronary artery.  2. The left anterior descending arteryis a large vessel that reaches the apex and generates several proximal septals, but is occluded before any of the major diagonal branches. There is evidence of extensive luminal irregularities and mild calcification.  3. The left circumflex coronary arteryis a medium-size vessel non dominant vessel that generates two major oblique marginal arteries. There is evidence of extensive luminal irregularities and mild calcification. No hemodynamically meaningful stenoses are seen. The first OM is flush occluded. The distal OM is medium in size.  4. The right  coronary arteryis a very large-size dominant vessel that generates a posterior lateral ventricular artery that is proximally occluded as well as a diffusely diseased PDA. There is evidence of extensive luminal irregularities and mild calcification.  5. The sequential SVG to the diagonal and first OMis very irregular in caliber, but widely patent, without stenoses. The first diagonal artery is severely diseased and small in caliber. The OM Isadora Delorey is large and free of major stenoses.  6. The SVG to the second diagonalis widely patent and healthy, feeding a relatively large vessel.  7. The LIMA to the LADis widely patent with both retrograde and antegrade LAD flow. There are significant stenoses both upstream and downstream of the anastomosis, but there is excellent flow.  8. The SVG to RCA-PLA has a severe, ulcerated stenosis in its distal third, at least 95% in severity, but with TIMI 3 flow. It is the culprit lesion.  5. The left ventriclewas not injected.There is no aortic valve stenosis by  pullback. The left ventricular end-diastolic pressure is 24 mm Hg.   IMPRESSIONS: High grade unstable stenosis in the SVG to PLA causing NSTEMI  RECOMMENDATION: Urgent PCI, preferably with filter wire.   IMPRESSIONS: 1. Successful PCI of the SVG to right coronary artery graft with a 3.0 x 23 Alpine drug-eluting stent - dilated to 3.25 mm in diameter, using a distal embolic protection device, 4.0 spider. 2.         04/2014 Carotid US Moderate bilateral stenosis  12/2011 Abdominal US No aortic aneurysm   Jan 2013 [facility] LVEF 65%      08/2014 Echo [facility] LVEF 60-65%, aortic valve sclerosis.      Assessment and Plan   1. CAD - recent exertional chest pain symptoms. Negative stress test, cath with fairly stable anatomy, recs for continued medical management - start imdur 15mg  daily.    F/u 3 months       Arnoldo Lenis, M.D

## 2016-04-15 ENCOUNTER — Encounter: Payer: Medicare Other | Admitting: Cardiology

## 2016-06-29 DIAGNOSIS — J0101 Acute recurrent maxillary sinusitis: Secondary | ICD-10-CM | POA: Diagnosis not present

## 2016-07-12 ENCOUNTER — Encounter: Payer: Self-pay | Admitting: *Deleted

## 2016-07-13 ENCOUNTER — Ambulatory Visit (INDEPENDENT_AMBULATORY_CARE_PROVIDER_SITE_OTHER): Payer: Medicare Other | Admitting: Cardiology

## 2016-07-13 ENCOUNTER — Encounter: Payer: Self-pay | Admitting: Cardiology

## 2016-07-13 VITALS — BP 168/70 | HR 55 | Ht 69.0 in | Wt 219.0 lb

## 2016-07-13 DIAGNOSIS — R0789 Other chest pain: Secondary | ICD-10-CM

## 2016-07-13 DIAGNOSIS — I6523 Occlusion and stenosis of bilateral carotid arteries: Secondary | ICD-10-CM | POA: Diagnosis not present

## 2016-07-13 DIAGNOSIS — I2510 Atherosclerotic heart disease of native coronary artery without angina pectoris: Secondary | ICD-10-CM | POA: Diagnosis not present

## 2016-07-13 DIAGNOSIS — I10 Essential (primary) hypertension: Secondary | ICD-10-CM

## 2016-07-13 DIAGNOSIS — E782 Mixed hyperlipidemia: Secondary | ICD-10-CM | POA: Diagnosis not present

## 2016-07-13 NOTE — Progress Notes (Signed)
Clinical Summary Mr. Pavon is a 71 y.o.male seen today for follow up of the following medical problems.   1. CAD - hx of CABG in Jan 2008 (LIMA-LAD, SVG-D2, SVG-OM, SVG-RPL) - admit [facility]/30/14 [name] with NSTEMI, received DES to SVG-RCA. No LV gram.  - 08/2014 echo from [place] LVEF 60-65%  - had nuclear stress 2 at [facility]. Large moderate lateral defect fixed. 11/2015.  - cath at [facility] 03/18/16. LM 20%, prox LAD 100% CTO, D2 diffuse disease, LCX LIs, OM2 80%, RCA right PL segment 90%. LIMA patent, SVG-OM2 patent, SVG-D2 50% stenosis, SVG-right PL occluded. Recs were for medical management. Fairly stable findings from prior cath however SVG-RCA PL which was previously stented is now occluded.   - since changing his diet has lost 32 lbs. Symptoms of chest pain have improved. 99% of time would occur after going to sleep at night.  - reports recent EGD overall looked good.  - no exertional chest pain since last visit - compliant with meds   2. OSA -he reports mixed compliance with CPAP  3. Claudication - followed by vascular at the [facility]  4. HTN - last visit planned to change  lisinopril to 40mg  daily. Does not appear the change was made - home bp's 150s/70s.   5. Bladder CA - followed at [facility]  6. Hyperilipidemia -compliant with statin, labs followed at [facility]   7. Carotid stenosis - RICA 08%, LICA <14% 48/1856.  - denies any neuro symptoms since last visit    Past Medical History:  Diagnosis Date  . Acute myocardial infarction of other inferior wall, initial episode of care   . Coronary artery disease    Artery bypass graft Jan 2008  . Hyperlipidemia   . Hypertension   . Tobacco user      Allergies  Allergen Reactions  . Cardizem Cd [Diltiazem Hcl Er Beads] Other (See Comments)    Makes heart skip     Current Outpatient Prescriptions  Medication Sig Dispense Refill  . amLODipine (NORVASC) 10 MG tablet Take 10 mg by mouth daily.    Marland Kitchen aspirin 81 MG chewable  tablet Chew 1 tablet (81 mg total) by mouth daily.    Marland Kitchen atorvastatin (LIPITOR) 80 MG tablet Take 1 tablet (80 mg total) by mouth daily. 30 tablet 6  . ergocalciferol (VITAMIN D2) 50000 UNITS capsule Take 50,000 Units by mouth once a week.    . hydrochlorothiazide (HYDRODIURIL) 25 MG tablet Take 25 mg by mouth daily.    . isosorbide mononitrate (IMDUR) 30 MG 24 hr tablet Take 0.5 tablets (15 mg total) by mouth daily. 45 tablet 3  . lisinopril (PRINIVIL,ZESTRIL) 40 MG tablet Take 1 tablet (40 mg total) by mouth daily. 90 tablet 3  . metoprolol succinate (TOPROL-XL) 100 MG 24 hr tablet Take 50 mg by mouth daily. Takes half tablet daily    . nitroGLYCERIN (NITROSTAT) 0.4 MG SL tablet Place 1 tablet (0.4 mg total) under the tongue every 5 (five) minutes x 3 doses as needed for chest pain. 25 tablet 3  . Omega-3 Fatty Acids (RA FISH OIL) 1000 MG CAPS Take 2 capsules by mouth 2 (two) times daily.     Marland Kitchen omeprazole (PRILOSEC) 20 MG capsule Take 40 mg by mouth daily.      No current facility-administered medications for this visit.      Past Surgical History:  Procedure Laterality Date  . APPENDECTOMY    . CORONARY ARTERY BYPASS GRAFT  06/14/2006   x 5  . FOOT SURGERY    . HERNIA REPAIR    . LEFT HEART CATHETERIZATION WITH CORONARY/GRAFT ANGIOGRAM N/A 01/31/2014   Procedure: LEFT HEART CATHETERIZATION WITH Beatrix Fetters;  Surgeon: Sanda Klein, MD;  Location: [facility] CATH LAB;  Service: Cardiovascular;  Laterality: N/A;  . NOSE SURGERY    . PERCUTANEOUS CORONARY STENT INTERVENTION (PCI-S)  01/31/2014   Procedure: PERCUTANEOUS CORONARY STENT INTERVENTION (PCI-S);  Surgeon: Sanda Klein, MD;  Location: [facility] CATH LAB;  Service: Cardiovascular;;     Allergies  Allergen Reactions  . Cardizem Cd [Diltiazem Hcl Er Beads] Other (See Comments)    Makes heart skip      Family History  Problem Relation Age of Onset  . Heart attack Mother   . Heart attack Brother      Social History Mr.  Eakle reports that he has been smoking Cigarettes.  He started smoking about 57 years ago. He has a 40.00 pack-year smoking history. He has never used smokeless tobacco. Mr. Kovar reports that he does not drink alcohol.   Review of Systems CONSTITUTIONAL: No weight loss, fever, chills, weakness or fatigue.  HEENT: Eyes: No visual loss, blurred vision, double vision or yellow sclerae.No hearing loss, sneezing, congestion, runny nose or sore throat.  SKIN: No rash or itching.  CARDIOVASCULAR: per hpi RESPIRATORY: No shortness of breath, cough or sputum.  GASTROINTESTINAL: No anorexia, nausea, vomiting or diarrhea. No abdominal pain or blood.  GENITOURINARY: No burning on urination, no polyuria NEUROLOGICAL: No headache, dizziness, syncope, paralysis, ataxia, numbness or tingling in the extremities. No change in bowel or bladder control.  MUSCULOSKELETAL: No muscle, back pain, joint pain or stiffness.  LYMPHATICS: No enlarged nodes. No history of splenectomy.  PSYCHIATRIC: No history of depression or anxiety.  ENDOCRINOLOGIC: No reports of sweating, cold or heat intolerance. No polyuria or polydipsia.  Marland Kitchen   Physical Examination Vitals:   07/13/16 1102  BP: (!) 168/70  Pulse: (!) 55   Vitals:   07/13/16 1102  Weight: 219 lb (99.3 kg)  Height: 5\' 9"  (1.753 m)    Gen: resting comfortably, no acute distress HEENT: no scleral icterus, pupils equal round and reactive, no palptable cervical adenopathy,  CV: RRR, no m/r/g, no jvd. +bilateral carotid bruits Resp: Clear to auscultation bilaterally GI: abdomen is soft, non-tender, non-distended, normal bowel sounds, no hepatosplenomegaly MSK: extremities are warm, no edema.  Skin: warm, no rash Neuro:  no focal deficits Psych: appropriate affect   Diagnostic Studies 01/31/14 Cath Angiographic Findings: 1. The left main coronary arteryis not stenotic, but exhibits significant atherosclerosis and bifurcates in the usual fashion into  the left anterior descending artery and left circumflex coronary artery.  2. The left anterior descending arteryis a large vessel that reaches the apex and generates several proximal septals, but is occluded before any of the major diagonal branches. There is evidence of extensive luminal irregularities and mild calcification.  3. The left circumflex coronary arteryis a medium-size vessel non dominant vessel that generates two major oblique marginal arteries. There is evidence of extensive luminal irregularities and mild calcification. No hemodynamically meaningful stenoses are seen. The first OM is flush occluded. The distal OM is medium in size.  4. The right coronary arteryis a very large-size dominant vessel that generates a posterior lateral ventricular artery that is proximally occluded as well as a diffusely diseased PDA. There is evidence of extensive luminal irregularities and mild calcification.  5. The sequential SVG to the diagonal and first  OMis very irregular in caliber, but widely patent, without stenoses. The first diagonal artery is severely diseased and small in caliber. The OM Merve Hotard is large and free of major stenoses.  6. The SVG to the second diagonalis widely patent and healthy, feeding a relatively large vessel.  7. The LIMA to the LADis widely patent with both retrograde and antegrade LAD flow. There are significant stenoses both upstream and downstream of the anastomosis, but there is excellent flow.  8. The SVG to RCA-PLA has a severe, ulcerated stenosis in its distal third, at least 95% in severity, but with TIMI 3 flow. It is the culprit lesion.  5. The left ventriclewas not injected.There is no aortic valve stenosis by pullback. The left ventricular end-diastolic pressure is 24 mm Hg.   IMPRESSIONS: High grade unstable stenosis in the SVG to PLA causing NSTEMI  RECOMMENDATION: Urgent PCI, preferably with filter wire.   IMPRESSIONS: 1. Successful  PCI of the SVG to right coronary artery graft with a 3.0 x 23 Alpine drug-eluting stent - dilated to 3.25 mm in diameter, using a distal embolic protection device, 4.0 spider. 2.         04/2014 Carotid US Moderate bilateral stenosis  12/2011 Abdominal US No aortic aneurysm   Jan 2013 [facility] LVEF 65%      08/2014 Echo [facility] LVEF 60-65%, aortic valve sclerosis.    cath at [facility] 03/18/16. LM 20%, prox LAD 100% CTO, D2 diffuse disease, LCX LIs, OM2 80%, RCA right PL segment 90%. LIMA patent, SVG-OM2 patent, SVG-D2 50% stenosis, SVG-right PL occluded. Recs were for medical management.   Assessment and Plan  1. CAD - no recent exertional symptoms - continue current mds  2. HTN - above goal. We will increase lisinopril to 40mg  daily. Ask for him to have BMET at the [place] in 2 weeks.   3. Hyperlipidemia -we will request labs from [place].  He will continue current statin  4. OSA - continue CPAP  5. Carotid stenosis - mild to moderate by recent US Continue to monitor  6. Bradycardia - decrease Toprol XL to 25mg  daily.   F/u 6 months        F/u 3 months      Arnoldo Lenis, M.D., F.A.C.C.

## 2016-07-13 NOTE — Patient Instructions (Signed)
Your physician wants you to follow-up in: [place] DR. BRANCH  You will receive a reminder letter in the mail two months in advance. If you don't receive a letter, please call our office to schedule the follow-up appointment.  Your physician has recommended you make the following change in your medication:   CHANGE TOPROL XL 25 MG DAILY  LISINOPRIL 40 MG DAILY  Your physician recommends that you return for lab work in: 1 WEEK BMP  Thank you for choosing [facility]!!

## 2016-09-13 DIAGNOSIS — Z7982 Long term (current) use of aspirin: Secondary | ICD-10-CM | POA: Diagnosis not present

## 2016-09-13 DIAGNOSIS — K219 Gastro-esophageal reflux disease without esophagitis: Secondary | ICD-10-CM | POA: Diagnosis not present

## 2016-09-13 DIAGNOSIS — R079 Chest pain, unspecified: Secondary | ICD-10-CM | POA: Diagnosis not present

## 2016-09-13 DIAGNOSIS — Z79899 Other long term (current) drug therapy: Secondary | ICD-10-CM | POA: Diagnosis not present

## 2016-09-13 DIAGNOSIS — I252 Old myocardial infarction: Secondary | ICD-10-CM | POA: Diagnosis not present

## 2016-09-13 DIAGNOSIS — Z8551 Personal history of malignant neoplasm of bladder: Secondary | ICD-10-CM | POA: Diagnosis not present

## 2016-09-13 DIAGNOSIS — R0789 Other chest pain: Secondary | ICD-10-CM | POA: Diagnosis not present

## 2016-09-13 DIAGNOSIS — I214 Non-ST elevation (NSTEMI) myocardial infarction: Secondary | ICD-10-CM | POA: Diagnosis not present

## 2016-09-13 DIAGNOSIS — Z951 Presence of aortocoronary bypass graft: Secondary | ICD-10-CM | POA: Diagnosis not present

## 2016-09-13 DIAGNOSIS — I200 Unstable angina: Secondary | ICD-10-CM | POA: Diagnosis not present

## 2016-09-14 ENCOUNTER — Encounter (HOSPITAL_COMMUNITY): Payer: Self-pay

## 2016-09-14 ENCOUNTER — Encounter (HOSPITAL_COMMUNITY)
Admission: EM | Disposition: A | Discharge status: Home / Self Care | Payer: Self-pay | Source: Other Acute Inpatient Hospital | Attending: Internal Medicine

## 2016-09-14 ENCOUNTER — Inpatient Hospital Stay (HOSPITAL_COMMUNITY)
Admission: EM | Admit: 2016-09-14 | Discharge: 2016-09-24 | DRG: 233 | Disposition: A | Discharge status: Home / Self Care | Payer: Medicare Other | Source: Other Acute Inpatient Hospital | Attending: Surgery | Admitting: Surgery

## 2016-09-14 DIAGNOSIS — Z951 Presence of aortocoronary bypass graft: Secondary | ICD-10-CM

## 2016-09-14 DIAGNOSIS — I10 Essential (primary) hypertension: Secondary | ICD-10-CM | POA: Diagnosis not present

## 2016-09-14 DIAGNOSIS — Z7982 Long term (current) use of aspirin: Secondary | ICD-10-CM

## 2016-09-14 DIAGNOSIS — F419 Anxiety disorder, unspecified: Secondary | ICD-10-CM | POA: Diagnosis present

## 2016-09-14 DIAGNOSIS — R001 Bradycardia, unspecified: Secondary | ICD-10-CM | POA: Diagnosis not present

## 2016-09-14 DIAGNOSIS — N183 Chronic kidney disease, stage 3 (moderate): Secondary | ICD-10-CM | POA: Diagnosis present

## 2016-09-14 DIAGNOSIS — E669 Obesity, unspecified: Secondary | ICD-10-CM | POA: Diagnosis present

## 2016-09-14 DIAGNOSIS — Z955 Presence of coronary angioplasty implant and graft: Secondary | ICD-10-CM

## 2016-09-14 DIAGNOSIS — F17200 Nicotine dependence, unspecified, uncomplicated: Secondary | ICD-10-CM

## 2016-09-14 DIAGNOSIS — I200 Unstable angina: Secondary | ICD-10-CM

## 2016-09-14 DIAGNOSIS — I25810 Atherosclerosis of coronary artery bypass graft(s) without angina pectoris: Secondary | ICD-10-CM

## 2016-09-14 DIAGNOSIS — I471 Supraventricular tachycardia: Secondary | ICD-10-CM | POA: Diagnosis not present

## 2016-09-14 DIAGNOSIS — T447X5A Adverse effect of beta-adrenoreceptor antagonists, initial encounter: Secondary | ICD-10-CM | POA: Diagnosis present

## 2016-09-14 DIAGNOSIS — Y832 Surgical operation with anastomosis, bypass or graft as the cause of abnormal reaction of the patient, or of later complication, without mention of misadventure at the time of the procedure: Secondary | ICD-10-CM | POA: Diagnosis present

## 2016-09-14 DIAGNOSIS — R112 Nausea with vomiting, unspecified: Secondary | ICD-10-CM | POA: Diagnosis not present

## 2016-09-14 DIAGNOSIS — I2510 Atherosclerotic heart disease of native coronary artery without angina pectoris: Secondary | ICD-10-CM

## 2016-09-14 DIAGNOSIS — I252 Old myocardial infarction: Secondary | ICD-10-CM

## 2016-09-14 DIAGNOSIS — E039 Hypothyroidism, unspecified: Secondary | ICD-10-CM | POA: Diagnosis present

## 2016-09-14 DIAGNOSIS — F17210 Nicotine dependence, cigarettes, uncomplicated: Secondary | ICD-10-CM | POA: Diagnosis present

## 2016-09-14 DIAGNOSIS — E8770 Fluid overload, unspecified: Secondary | ICD-10-CM | POA: Diagnosis not present

## 2016-09-14 DIAGNOSIS — R079 Chest pain, unspecified: Secondary | ICD-10-CM | POA: Diagnosis not present

## 2016-09-14 DIAGNOSIS — Z4682 Encounter for fitting and adjustment of non-vascular catheter: Secondary | ICD-10-CM | POA: Diagnosis not present

## 2016-09-14 DIAGNOSIS — K449 Diaphragmatic hernia without obstruction or gangrene: Secondary | ICD-10-CM | POA: Diagnosis present

## 2016-09-14 DIAGNOSIS — I129 Hypertensive chronic kidney disease with stage 1 through stage 4 chronic kidney disease, or unspecified chronic kidney disease: Secondary | ICD-10-CM | POA: Diagnosis present

## 2016-09-14 DIAGNOSIS — E785 Hyperlipidemia, unspecified: Secondary | ICD-10-CM | POA: Diagnosis not present

## 2016-09-14 DIAGNOSIS — T82857A Stenosis of cardiac prosthetic devices, implants and grafts, initial encounter: Principal | ICD-10-CM | POA: Diagnosis present

## 2016-09-14 DIAGNOSIS — Z8673 Personal history of transient ischemic attack (TIA), and cerebral infarction without residual deficits: Secondary | ICD-10-CM

## 2016-09-14 DIAGNOSIS — R011 Cardiac murmur, unspecified: Secondary | ICD-10-CM | POA: Diagnosis present

## 2016-09-14 DIAGNOSIS — Z79890 Hormone replacement therapy (postmenopausal): Secondary | ICD-10-CM

## 2016-09-14 DIAGNOSIS — Z888 Allergy status to other drugs, medicaments and biological substances status: Secondary | ICD-10-CM

## 2016-09-14 DIAGNOSIS — I6522 Occlusion and stenosis of left carotid artery: Secondary | ICD-10-CM | POA: Diagnosis not present

## 2016-09-14 DIAGNOSIS — I25110 Atherosclerotic heart disease of native coronary artery with unstable angina pectoris: Secondary | ICD-10-CM | POA: Diagnosis not present

## 2016-09-14 DIAGNOSIS — Z6833 Body mass index (BMI) 33.0-33.9, adult: Secondary | ICD-10-CM

## 2016-09-14 DIAGNOSIS — I214 Non-ST elevation (NSTEMI) myocardial infarction: Secondary | ICD-10-CM | POA: Diagnosis not present

## 2016-09-14 DIAGNOSIS — J90 Pleural effusion, not elsewhere classified: Secondary | ICD-10-CM

## 2016-09-14 DIAGNOSIS — Z8249 Family history of ischemic heart disease and other diseases of the circulatory system: Secondary | ICD-10-CM

## 2016-09-14 DIAGNOSIS — I350 Nonrheumatic aortic (valve) stenosis: Secondary | ICD-10-CM | POA: Diagnosis present

## 2016-09-14 DIAGNOSIS — J449 Chronic obstructive pulmonary disease, unspecified: Secondary | ICD-10-CM | POA: Diagnosis not present

## 2016-09-14 DIAGNOSIS — J9811 Atelectasis: Secondary | ICD-10-CM | POA: Diagnosis not present

## 2016-09-14 DIAGNOSIS — R918 Other nonspecific abnormal finding of lung field: Secondary | ICD-10-CM | POA: Diagnosis not present

## 2016-09-14 DIAGNOSIS — D62 Acute posthemorrhagic anemia: Secondary | ICD-10-CM | POA: Diagnosis not present

## 2016-09-14 DIAGNOSIS — K219 Gastro-esophageal reflux disease without esophagitis: Secondary | ICD-10-CM | POA: Diagnosis present

## 2016-09-14 DIAGNOSIS — G4733 Obstructive sleep apnea (adult) (pediatric): Secondary | ICD-10-CM

## 2016-09-14 DIAGNOSIS — Z79899 Other long term (current) drug therapy: Secondary | ICD-10-CM | POA: Diagnosis not present

## 2016-09-14 DIAGNOSIS — Z01810 Encounter for preprocedural cardiovascular examination: Secondary | ICD-10-CM | POA: Diagnosis not present

## 2016-09-14 DIAGNOSIS — I213 ST elevation (STEMI) myocardial infarction of unspecified site: Secondary | ICD-10-CM | POA: Diagnosis not present

## 2016-09-14 DIAGNOSIS — Z8551 Personal history of malignant neoplasm of bladder: Secondary | ICD-10-CM

## 2016-09-14 DIAGNOSIS — I25119 Atherosclerotic heart disease of native coronary artery with unspecified angina pectoris: Secondary | ICD-10-CM | POA: Diagnosis not present

## 2016-09-14 DIAGNOSIS — M1990 Unspecified osteoarthritis, unspecified site: Secondary | ICD-10-CM | POA: Diagnosis present

## 2016-09-14 DIAGNOSIS — R0789 Other chest pain: Secondary | ICD-10-CM | POA: Diagnosis not present

## 2016-09-14 DIAGNOSIS — K5900 Constipation, unspecified: Secondary | ICD-10-CM | POA: Diagnosis not present

## 2016-09-14 DIAGNOSIS — I080 Rheumatic disorders of both mitral and aortic valves: Secondary | ICD-10-CM | POA: Diagnosis not present

## 2016-09-14 HISTORY — DX: Anxiety disorder, unspecified: F41.9

## 2016-09-14 HISTORY — DX: Gastro-esophageal reflux disease without esophagitis: K21.9

## 2016-09-14 HISTORY — DX: Nausea with vomiting, unspecified: R11.2

## 2016-09-14 HISTORY — DX: Dyspnea, unspecified: R06.00

## 2016-09-14 HISTORY — DX: Personal history of other diseases of the digestive system: Z87.19

## 2016-09-14 HISTORY — DX: Hypothyroidism, unspecified: E03.9

## 2016-09-14 HISTORY — DX: Cerebral infarction, unspecified: I63.9

## 2016-09-14 HISTORY — DX: Malignant (primary) neoplasm, unspecified: C80.1

## 2016-09-14 HISTORY — DX: Cardiac murmur, unspecified: R01.1

## 2016-09-14 HISTORY — DX: Sleep apnea, unspecified: G47.30

## 2016-09-14 HISTORY — DX: Angina pectoris, unspecified: I20.9

## 2016-09-14 HISTORY — PX: LEFT HEART CATH AND CORS/GRAFTS ANGIOGRAPHY: CATH118250

## 2016-09-14 HISTORY — DX: Cardiac arrhythmia, unspecified: I49.9

## 2016-09-14 HISTORY — DX: Unspecified osteoarthritis, unspecified site: M19.90

## 2016-09-14 HISTORY — DX: Nausea with vomiting, unspecified: Z98.890

## 2016-09-14 HISTORY — DX: Chronic obstructive pulmonary disease, unspecified: J44.9

## 2016-09-14 LAB — LIPID PANEL
CHOL/HDL RATIO: 8.4 ratio
Cholesterol: 194 mg/dL (ref 0–200)
HDL: 23 mg/dL — AB (ref >40)
LDL CALC: 127 mg/dL — AB (ref 0–99)
TRIGLYCERIDES: 218 mg/dL — AB (ref <150)
VLDL: 44 mg/dL — AB (ref 0–40)

## 2016-09-14 LAB — BASIC METABOLIC PANEL
ANION GAP: 8 (ref 5–15)
BUN: 28 mg/dL — ABNORMAL HIGH (ref 6–20)
CALCIUM: 8.6 mg/dL — AB (ref 8.9–10.3)
CHLORIDE: 105 mmol/L (ref 101–111)
CO2: 24 mmol/L (ref 22–32)
Creatinine, Ser: 1.59 mg/dL — ABNORMAL HIGH (ref 0.61–1.24)
GFR calc non Af Amer: 42 mL/min — ABNORMAL LOW (ref >60)
GFR, EST AFRICAN AMERICAN: 49 mL/min — AB (ref >60)
GLUCOSE: 102 mg/dL — AB (ref 65–99)
Potassium: 3.8 mmol/L (ref 3.5–5.1)
Sodium: 137 mmol/L (ref 135–145)

## 2016-09-14 LAB — CBC
HCT: 34.9 % — ABNORMAL LOW (ref 39.0–52.0)
Hemoglobin: 11.8 g/dL — ABNORMAL LOW (ref 13.0–17.0)
MCH: 28.3 pg (ref 26.0–34.0)
MCHC: 33.8 g/dL (ref 30.0–36.0)
MCV: 83.7 fL (ref 78.0–100.0)
PLATELETS: 184 10*3/uL (ref 150–400)
RBC: 4.17 MIL/uL — ABNORMAL LOW (ref 4.22–5.81)
RDW: 14 % (ref 11.5–15.5)
WBC: 6.4 10*3/uL (ref 4.0–10.5)

## 2016-09-14 LAB — TROPONIN I
TROPONIN I: 0.43 ng/mL — AB (ref <0.03)
Troponin I: 0.33 ng/mL (ref <0.03)
Troponin I: 0.39 ng/mL (ref <0.03)

## 2016-09-14 LAB — SURGICAL PCR SCREEN
MRSA, PCR: NEGATIVE
Staphylococcus aureus: NEGATIVE

## 2016-09-14 LAB — PROTIME-INR
INR: 1.12
PROTHROMBIN TIME: 14.5 s (ref 11.4–15.2)

## 2016-09-14 LAB — POCT ACTIVATED CLOTTING TIME: ACTIVATED CLOTTING TIME: 125 s

## 2016-09-14 SURGERY — LEFT HEART CATH AND CORS/GRAFTS ANGIOGRAPHY
Anesthesia: LOCAL

## 2016-09-14 MED ORDER — ONDANSETRON HCL 4 MG/2ML IJ SOLN: 4.0000 mg | Freq: Four times a day (QID) | INTRAMUSCULAR | Status: DC | PRN

## 2016-09-14 MED ORDER — NITROGLYCERIN 0.4 MG SL SUBL
0.4000 mg | SUBLINGUAL_TABLET | SUBLINGUAL | Status: DC | PRN
  Administered 2016-09-15 – 2016-09-16 (×5): 0.4 mg via SUBLINGUAL
  Filled 2016-09-14 (×2): qty 1

## 2016-09-14 MED ORDER — ATORVASTATIN CALCIUM 80 MG PO TABS
80.0000 mg | ORAL_TABLET | Freq: Every day | ORAL | Status: DC
  Administered 2016-09-14 – 2016-09-16 (×3): 80 mg via ORAL
  Filled 2016-09-14 (×3): qty 1

## 2016-09-14 MED ORDER — ASPIRIN 81 MG PO CHEW: 81.0000 mg | CHEWABLE_TABLET | Freq: Every day | ORAL | Status: DC

## 2016-09-14 MED ORDER — SODIUM CHLORIDE 0.9 % IV SOLN: 250.0000 mL | INTRAVENOUS | Status: DC | PRN

## 2016-09-14 MED ORDER — AMLODIPINE BESYLATE 10 MG PO TABS
10.0000 mg | ORAL_TABLET | Freq: Every day | ORAL | Status: DC
  Administered 2016-09-14 – 2016-09-18 (×5): 10 mg via ORAL
  Filled 2016-09-14 (×5): qty 1

## 2016-09-14 MED ORDER — NITROGLYCERIN 0.4 MG SL SUBL: 0.4000 mg | SUBLINGUAL_TABLET | SUBLINGUAL | Status: DC | PRN

## 2016-09-14 MED ORDER — PANTOPRAZOLE SODIUM 40 MG PO TBEC
40.0000 mg | DELAYED_RELEASE_TABLET | Freq: Every day | ORAL | Status: DC
  Administered 2016-09-14 – 2016-09-18 (×5): 40 mg via ORAL
  Filled 2016-09-14 (×5): qty 1

## 2016-09-14 MED ORDER — ACETAMINOPHEN 325 MG PO TABS: 650.0000 mg | ORAL_TABLET | ORAL | Status: DC | PRN

## 2016-09-14 MED ORDER — HEPARIN (PORCINE) IN NACL 2-0.9 UNIT/ML-% IJ SOLN
INTRAMUSCULAR | Status: AC
  Filled 2016-09-14: qty 1000

## 2016-09-14 MED ORDER — FENTANYL CITRATE (PF) 100 MCG/2ML IJ SOLN
INTRAMUSCULAR | Status: AC
  Filled 2016-09-14: qty 2

## 2016-09-14 MED ORDER — LISINOPRIL 40 MG PO TABS: 40.0000 mg | ORAL_TABLET | Freq: Every day | ORAL | Status: DC

## 2016-09-14 MED ORDER — SODIUM CHLORIDE 0.9% FLUSH: 3.0000 mL | INTRAVENOUS | Status: DC | PRN

## 2016-09-14 MED ORDER — SODIUM CHLORIDE 0.9 % IV SOLN: INTRAVENOUS | Status: DC

## 2016-09-14 MED ORDER — FENTANYL CITRATE (PF) 100 MCG/2ML IJ SOLN
INTRAMUSCULAR | Status: DC | PRN
  Administered 2016-09-14: 25 ug via INTRAVENOUS
  Administered 2016-09-14: 50 ug via INTRAVENOUS

## 2016-09-14 MED ORDER — DIAZEPAM 5 MG PO TABS: 5.0000 mg | ORAL_TABLET | Freq: Four times a day (QID) | ORAL | Status: DC | PRN

## 2016-09-14 MED ORDER — ASPIRIN EC 81 MG PO TBEC
81.0000 mg | DELAYED_RELEASE_TABLET | Freq: Every day | ORAL | Status: DC
  Administered 2016-09-15 – 2016-09-18 (×4): 81 mg via ORAL
  Filled 2016-09-14 (×4): qty 1

## 2016-09-14 MED ORDER — ISOSORBIDE MONONITRATE ER 30 MG PO TB24
15.0000 mg | ORAL_TABLET | Freq: Every day | ORAL | Status: DC
  Administered 2016-09-14 – 2016-09-15 (×2): 15 mg via ORAL
  Filled 2016-09-14 (×2): qty 1

## 2016-09-14 MED ORDER — HEPARIN (PORCINE) IN NACL 100-0.45 UNIT/ML-% IJ SOLN
1250.0000 [IU]/h | INTRAMUSCULAR | Status: DC
  Administered 2016-09-14: 1250 [IU]/h via INTRAVENOUS
  Filled 2016-09-14: qty 250

## 2016-09-14 MED ORDER — HEPARIN (PORCINE) IN NACL 100-0.45 UNIT/ML-% IJ SOLN
1400.0000 [IU]/h | INTRAMUSCULAR | Status: DC
  Administered 2016-09-14 – 2016-09-17 (×4): 1250 [IU]/h via INTRAVENOUS
  Administered 2016-09-18: 1400 [IU]/h via INTRAVENOUS
  Filled 2016-09-14 (×6): qty 250

## 2016-09-14 MED ORDER — ACETAMINOPHEN 325 MG PO TABS
650.0000 mg | ORAL_TABLET | ORAL | Status: DC | PRN
  Administered 2016-09-17: 650 mg via ORAL
  Filled 2016-09-14: qty 2

## 2016-09-14 MED ORDER — HYDROCHLOROTHIAZIDE 25 MG PO TABS
25.0000 mg | ORAL_TABLET | Freq: Every day | ORAL | Status: DC
  Administered 2016-09-15 – 2016-09-18 (×4): 25 mg via ORAL
  Filled 2016-09-14 (×5): qty 1

## 2016-09-14 MED ORDER — MIDAZOLAM HCL 2 MG/2ML IJ SOLN
INTRAMUSCULAR | Status: AC
  Filled 2016-09-14: qty 2

## 2016-09-14 MED ORDER — OMEGA-3-ACID ETHYL ESTERS 1 G PO CAPS
1.0000 g | ORAL_CAPSULE | Freq: Two times a day (BID) | ORAL | Status: DC
  Administered 2016-09-14 – 2016-09-18 (×10): 1 g via ORAL
  Filled 2016-09-14 (×10): qty 1

## 2016-09-14 MED ORDER — SODIUM CHLORIDE 0.9 % WEIGHT BASED INFUSION: 1.0000 mL/kg/h | INTRAVENOUS | Status: DC

## 2016-09-14 MED ORDER — SODIUM CHLORIDE 0.9% FLUSH: 3.0000 mL | Freq: Two times a day (BID) | INTRAVENOUS | Status: DC

## 2016-09-14 MED ORDER — LIDOCAINE HCL (PF) 1 % IJ SOLN
INTRAMUSCULAR | Status: AC
  Filled 2016-09-14: qty 30

## 2016-09-14 MED ORDER — IOPAMIDOL (ISOVUE-370) INJECTION 76%
INTRAVENOUS | Status: AC
  Filled 2016-09-14: qty 125

## 2016-09-14 MED ORDER — ASPIRIN 81 MG PO CHEW
81.0000 mg | CHEWABLE_TABLET | ORAL | Status: AC
  Administered 2016-09-14: 81 mg via ORAL
  Filled 2016-09-14: qty 1

## 2016-09-14 MED ORDER — HEPARIN (PORCINE) IN NACL 2-0.9 UNIT/ML-% IJ SOLN
INTRAMUSCULAR | Status: DC | PRN
  Administered 2016-09-14: 1000 mL via INTRA_ARTERIAL

## 2016-09-14 MED ORDER — MIDAZOLAM HCL 2 MG/2ML IJ SOLN
INTRAMUSCULAR | Status: DC | PRN
  Administered 2016-09-14: 1 mg via INTRAVENOUS
  Administered 2016-09-14: 2 mg via INTRAVENOUS

## 2016-09-14 MED ORDER — LEVOTHYROXINE SODIUM 25 MCG PO TABS
25.0000 ug | ORAL_TABLET | Freq: Every day | ORAL | Status: DC
  Administered 2016-09-14 – 2016-09-24 (×9): 25 ug via ORAL
  Filled 2016-09-14 (×10): qty 1

## 2016-09-14 MED ORDER — SODIUM CHLORIDE 0.9% FLUSH
3.0000 mL | Freq: Two times a day (BID) | INTRAVENOUS | Status: DC
  Administered 2016-09-15 – 2016-09-17 (×4): 3 mL via INTRAVENOUS

## 2016-09-14 MED ORDER — LIDOCAINE HCL (PF) 1 % IJ SOLN
INTRAMUSCULAR | Status: DC | PRN
  Administered 2016-09-14: 20 mL

## 2016-09-14 MED ORDER — RA FISH OIL 1000 MG PO CAPS: 2.0000 | ORAL_CAPSULE | Freq: Two times a day (BID) | ORAL | Status: DC

## 2016-09-14 MED ORDER — SODIUM CHLORIDE 0.9 % WEIGHT BASED INFUSION
3.0000 mL/kg/h | INTRAVENOUS | Status: DC
  Administered 2016-09-14: 3 mL/kg/h via INTRAVENOUS

## 2016-09-14 MED ORDER — SODIUM CHLORIDE 0.9 % IV SOLN
INTRAVENOUS | Status: AC
  Administered 2016-09-14: 05:00:00 via INTRAVENOUS

## 2016-09-14 SURGICAL SUPPLY — 12 items
CATH INFINITI 5 FR IM (CATHETERS) ×2 IMPLANT
CATH INFINITI 5 FR RCB (CATHETERS) ×2 IMPLANT
CATH INFINITI 5FR MULTPACK ANG (CATHETERS) ×2 IMPLANT
GUIDEWIRE 3MM J TIP .035 145 (WIRE) ×2 IMPLANT
GUIDEWIRE ANGLED .035X150CM (WIRE) ×2 IMPLANT
KIT HEART LEFT (KITS) ×2 IMPLANT
PACK CARDIAC CATHETERIZATION (CUSTOM PROCEDURE TRAY) ×2 IMPLANT
SHEATH PINNACLE 5F 10CM (SHEATH) ×2 IMPLANT
SYR MEDRAD MARK V 150ML (SYRINGE) ×2 IMPLANT
TRANSDUCER W/STOPCOCK (MISCELLANEOUS) ×2 IMPLANT
TUBING CIL FLEX 10 FLL-RA (TUBING) ×2 IMPLANT
WIRE HI TORQ VERSACORE-J 145CM (WIRE) ×2 IMPLANT

## 2016-09-14 NOTE — Progress Notes (Signed)
Notified MD of critical troponin of 0.33

## 2016-09-14 NOTE — Progress Notes (Signed)
ANTICOAGULATION CONSULT NOTE - Initial Consult  Pharmacy Consult for Heparin Indication: NSTEMI  Allergies  Allergen Reactions  . Cardizem Cd [Diltiazem Hcl Er Beads] Other (See Comments)    Makes heart skip    Patient Measurements: Height: 5\' 9"  (175.3 cm) Weight: 204 lb 3.2 oz (92.6 kg) IBW/kg (Calculated) : 70.7 Heparin Dosing Weight: 92 kg  Vital Signs: Temp: 98.2 F (36.8 C) (05/02 0400) BP: 136/54 (05/02 0400) Pulse Rate: 50 (05/02 0400)  Labs:  Recent Labs  09/14/16 0338  CREATININE 1.59*  TROPONINI 0.33*    Estimated Creatinine Clearance: 48.6 mL/min (A) (by C-G formula based on SCr of 1.59 mg/dL (H)).   Medical History: Past Medical History:  Diagnosis Date  . Acute myocardial infarction of other inferior wall, initial episode of care   . Anginal pain ([place])   . Anxiety   . Arthritis   . Cancer [facility])    bladder 2013 removed, has cystoscopy 10/2016, has returned x 2  . COPD (chronic obstructive pulmonary disease) ([place])   . Coronary artery disease    Artery bypass graft Jan 2008  . Dyspnea   . Dysrhythmia   . GERD (gastroesophageal reflux disease)   . Heart murmur   . History of hiatal hernia   . Hyperlipidemia   . Hypertension   . Hypothyroidism   . PONV (postoperative nausea and vomiting)   . Sleep apnea   . Stroke ([place])   . Tobacco user     Medications:  Prescriptions Prior to Admission  Medication Sig Dispense Refill Last Dose  . amLODipine (NORVASC) 10 MG tablet Take 10 mg by mouth daily.   09/13/2016 at Unknown time  . aspirin 81 MG chewable tablet Chew 1 tablet (81 mg total) by mouth daily.   09/13/2016 at Unknown time  . atorvastatin (LIPITOR) 80 MG tablet Take 1 tablet (80 mg total) by mouth daily. 30 tablet 6 09/13/2016 at Unknown time  . ergocalciferol (VITAMIN D2) 50000 UNITS capsule Take 50,000 Units by mouth once a week.   Past Week at Unknown time  . hydrochlorothiazide (HYDRODIURIL) 25 MG tablet Take 25 mg by mouth daily.   09/13/2016  at Unknown time  . isosorbide dinitrate (ISORDIL) 30 MG tablet Take 30 mg by mouth 4 (four) times daily.     . isosorbide mononitrate (IMDUR) 30 MG 24 hr tablet Take 0.5 tablets (15 mg total) by mouth daily. 45 tablet 3 09/13/2016 at Unknown time  . levothyroxine (SYNTHROID, LEVOTHROID) 25 MCG tablet Take 25 mcg by mouth daily before breakfast.   09/13/2016 at Unknown time  . lisinopril (PRINIVIL,ZESTRIL) 40 MG tablet Take 1 tablet (40 mg total) by mouth daily. 90 tablet 3 09/13/2016 at Unknown time  . metoprolol succinate (TOPROL-XL) 25 MG 24 hr tablet Take 25 mg by mouth daily.   09/13/2016 at Unknown time  . nitroGLYCERIN (NITROSTAT) 0.4 MG SL tablet Place 1 tablet (0.4 mg total) under the tongue every 5 (five) minutes x 3 doses as needed for chest pain. 25 tablet 3 09/13/2016 at Unknown time  . Omega-3 Fatty Acids (RA FISH OIL) 1000 MG CAPS Take 2 capsules by mouth 2 (two) times daily.    09/13/2016 at Unknown time  . omeprazole (PRILOSEC) 20 MG capsule Take 40 mg by mouth daily.    09/13/2016 at Unknown time    Assessment: 71 y.o. M presented to [facility] for CP. Received Lovenox 95mg  SQ 5/1 ~1900. Transferred to [facility] for cardiac workup. To start heparin gtt  per pharmacy.  Goal of Therapy:  Heparin level 0.3-0.7 units/ml Monitor platelets by anticoagulation protocol: Yes   Plan:  Start heparin gtt at 1250 units/hr at 0700 (~12 hours post Lovenox dose). No bolus. Will f/u heparin level in 8 hours Daily heparin level and CBC  Sherlon Handing, PharmD, BCPS Clinical pharmacist, pager [phone] 09/14/2016,4:51 AM

## 2016-09-14 NOTE — Progress Notes (Addendum)
Site area: RFA Site Prior to Removal:  Level 0 Pressure Applied For:30 min Manual: yes   Patient Status During Pull:  stable Post Pull Site:  Level 2 Post Pull Instructions Given: yes  Post Pull Pulses Present: palpable Dressing Applied:  Pressure Bedrest begins @ 1345 till 1745 Comments: Raised area hard to palpation below insertion site. Early stage of bruising noted. Area marked and [name] consulted.

## 2016-09-14 NOTE — Progress Notes (Signed)
Received patient from [facility], assessment completed, VS documented, oriented patient to the room.  CHG bath completed, MRSA swabl sent, Will continue to monitor.

## 2016-09-14 NOTE — Interval H&P Note (Signed)
Cath Lab Visit (complete for each Cath Lab visit)  Clinical Evaluation Leading to the Procedure:   ACS: No.  Non-ACS:    Anginal Classification: CCS IV  Anti-ischemic medical therapy: Maximal Therapy (2 or more classes of medications)  Non-Invasive Test Results: No non-invasive testing performed  Prior CABG: Previous CABG      History and Physical Interval Note:  09/14/2016 11:37 AM  [name]  has presented today for surgery, with the diagnosis of cp  The various methods of treatment have been discussed with the patient and family. After consideration of risks, benefits and other options for treatment, the patient has consented to  Procedure(s): Left Heart Cath and Cors/Grafts Angiography (N/A) as a surgical intervention .  The patient's history has been reviewed, patient examined, no change in status, stable for surgery.  I have reviewed the patient's chart and labs.  Questions were answered to the patient's satisfaction.     [name]

## 2016-09-14 NOTE — H&P (Signed)
History & Physical    Patient ID: [name] MRN: [id number], DOB/AGE: [birth year]   Admit date: 09/14/2016   Primary Physician: Cleophas Dunker, MD Primary Cardiologist: Carlyle Dolly, MD   History of Present Illness    [name] is a 71 y.o. male with past medical history of coronary artery disease with coronary artery bypass surgery in 2008 (LIMA-LAD, VG-D2, VG-D1-OM, VG-RPL) with subsequent drug eluting stent to his SVG-RCA in 2015, peripheral arterial disease with carotid involvement, prior TIA,  essential hypertension, bladder cancer, obstructive sleep apnea who was transferred from [facility] for management of NSTEMI-ACS.    [name] reports at around 6 pm today while sitting outside on his porch, he started having severe pain in his chest.  The pain was in the middle of his chest and went to his left arm.  The pain was a "constant pain" that felt like pressure.  He rated his pain at the time he had it a 15/10 in intensity.  Along with chest pain was nausea and sweating.  [name] took one nitroglycerin and it helped his pain initially, but after 10 minutes the pain came back.  He tried to take a second nitroglycerin but it didn't help his pain.  He decided to come to the hospital to be evaluated at [facility].  His chest pain eventually resolve after 1 hour.  He also tried taking aspirin to help with hs pain. Patient denies any other symptoms with his chest pain such as feeling lightheaded, dizzy, palpitations, or short of breath.  He denies having chest pain at this time.  For the past 3 weeks, he says he has been awakened from sleep almost daily due to chest pain.  The pain is similar to what he has now and eventually go away on its own.  He also states that for the past year, walking up an incline causes him to have chest pain as well, but this has not gotten worse.     Past Medical History    Past Medical History:  Diagnosis Date  . Acute  myocardial infarction of other inferior wall, initial episode of care   . Coronary artery disease    Artery bypass graft Jan 2008  . Hyperlipidemia   . Hypertension   . Tobacco user     Past Surgical History:  Procedure Laterality Date  . APPENDECTOMY    . CORONARY ARTERY BYPASS GRAFT  06/14/2006   x 5  . FOOT SURGERY    . HERNIA REPAIR    . LEFT HEART CATHETERIZATION WITH CORONARY/GRAFT ANGIOGRAM N/A 01/31/2014   Procedure: LEFT HEART CATHETERIZATION WITH Beatrix Fetters;  Surgeon: Sanda Klein, MD;  Location: [place] CATH LAB;  Service: Cardiovascular;  Laterality: N/A;  . NOSE SURGERY    . PERCUTANEOUS CORONARY STENT INTERVENTION (PCI-S)  01/31/2014   Procedure: PERCUTANEOUS CORONARY STENT INTERVENTION (PCI-S);  Surgeon: Sanda Klein, MD;  Location: [facility] CATH LAB;  Service: Cardiovascular;;     Allergies  Allergies  Allergen Reactions  . Cardizem Cd [Diltiazem Hcl Er Beads] Other (See Comments)    Makes heart skip     Home Medications    Prior to Admission medications   Medication Sig Start Date End Date Taking? Authorizing Provider  amLODipine (NORVASC) 10 MG tablet Take 10 mg by mouth daily.    Historical Provider, MD  aspirin 81 MG chewable tablet Chew 1 tablet (81 mg total) by mouth daily. 02/01/14   Lendon Colonel,  NP  atorvastatin (LIPITOR) 80 MG tablet Take 1 tablet (80 mg total) by mouth daily. 02/19/14   Arnoldo Lenis, MD  ergocalciferol (VITAMIN D2) 50000 UNITS capsule Take 50,000 Units by mouth once a week.    Historical Provider, MD  hydrochlorothiazide (HYDRODIURIL) 25 MG tablet Take 25 mg by mouth daily.    Historical Provider, MD  isosorbide mononitrate (IMDUR) 30 MG 24 hr tablet Take 0.5 tablets (15 mg total) by mouth daily. 04/14/16 07/13/16  Arnoldo Lenis, MD  levothyroxine (SYNTHROID, LEVOTHROID) 25 MCG tablet Take 25 mcg by mouth daily before breakfast.    Historical Provider, MD  lisinopril (PRINIVIL,ZESTRIL) 40 MG tablet Take 1 tablet (40  mg total) by mouth daily. 08/13/15   Arnoldo Lenis, MD  metoprolol succinate (TOPROL-XL) 25 MG 24 hr tablet Take 25 mg by mouth daily.    Historical Provider, MD  nitroGLYCERIN (NITROSTAT) 0.4 MG SL tablet Place 1 tablet (0.4 mg total) under the tongue every 5 (five) minutes x 3 doses as needed for chest pain. 08/13/15   Arnoldo Lenis, MD  Omega-3 Fatty Acids (RA FISH OIL) 1000 MG CAPS Take 2 capsules by mouth 2 (two) times daily.     Historical Provider, MD  omeprazole (PRILOSEC) 20 MG capsule Take 40 mg by mouth daily.     Historical Provider, MD    Family History    Family History  Problem Relation Age of Onset  . Heart attack Mother   . Heart attack Brother     Social History    Social History   Social History  . Marital status: Married    Spouse name: N/A  . Number of children: N/A  . Years of education: N/A   Occupational History  . Not on file.   Social History Main Topics  . Smoking status: Current Every Day Smoker    Packs/day: 1.00    Years: 40.00    Types: Cigarettes    Start date: 02/20/1959  . Smokeless tobacco: Never Used     Comment: 10/7 2-8 cigarettes per day. cutting back- AJ  . Alcohol use No     Comment: "No, not really"  . Drug use: No  . Sexual activity: Not on file   Other Topics Concern  . Not on file   Social History Narrative  . No narrative on file     Review of Systems    General:  No chills, fever, night sweats or weight changes.  Cardiovascular:  No chest pain, dyspnea on exertion, edema, orthopnea, palpitations, paroxysmal nocturnal dyspnea. Dermatological: No rash, lesions/masses Respiratory: No cough, dyspnea Urologic: No hematuria, dysuria Abdominal:   No nausea, vomiting, diarrhea, bright red blood per rectum, melena, or hematemesis Neurologic:  No visual changes, wkns, changes in mental status. All other systems reviewed and are otherwise negative except as noted above.  Physical Exam    Blood pressure (!) 170/61,  pulse (!) 52, temperature 97.8 F (36.6 C), resp. rate (!) 23, height 5\' 9"  (1.753 m), weight 92.6 kg (204 lb 3.2 oz), SpO2 91 %.  General: Well developed, well nourished,male in no acute distress. Head: Normocephalic, atraumatic, sclera non-icteric, no xanthomas, nares are without discharge. Dentition:  Neck: No carotid bruits. JVD not elevated.  Lungs: Respirations regular and unlabored, without wheezes or rales.  Heart: Regular rate and rhythm. No S3 or S4.  1/6 systolic ejection murmur, no rubs, or gallops appreciated. Abdomen: Soft, non-tender, non-distended with normoactive bowel sounds. No hepatomegaly. No rebound/guarding.  No obvious abdominal masses. Msk:  Strength and tone appear normal for age. No joint deformities or effusions. Extremities: No clubbing or cyanosis. No edema.  Distal pedal pulses are 2+ bilaterally. Neuro: Alert and oriented X 3. Moves all extremities spontaneously. No focal deficits noted. Psych:  Responds to questions appropriately with a normal affect. Skin: No rashes or lesions noted  Labs    Troponin (Point of Care Test) No results for input(s): TROPIPOC in the last 72 hours. No results for input(s): CKTOTAL, CKMB, TROPONINI in the last 72 hours. Lab Results  Component Value Date   WBC 6.8 02/01/2014   HGB 14.7 02/01/2014   HCT 41.9 02/01/2014   MCV 85.3 02/01/2014   PLT 167 02/01/2014   No results for input(s): NA, K, CL, CO2, BUN, CREATININE, CALCIUM, PROT, BILITOT, ALKPHOS, ALT, AST, GLUCOSE in the last 168 hours.  Invalid input(s): LABALBU Lab Results  Component Value Date   CHOL 220 (H) 01/31/2014   HDL 25 (L) 01/31/2014   LDLCALC 134 (H) 01/31/2014   TRIG 307 (H) 01/31/2014   No results found for: DDIMER  No results found for: BNP No results found for: PROBNP No results for input(s): INR in the last 72 hours.    Radiology Studies    No results found.  EKG & Cardiac Imaging    EKG:  09/13/16 (20:57):  NSR; posterior Q  waves  ECHOCARDIOGRAM: 09/01/14 Normal left ventricular function with EF 60-65%; normal right ventricular function, aortic sclerosis   Assessment & Plan    Active Problems:   * No active hospital problems. *  # Chest pain Patient present with chest substernal chest pain in the setting of an equivocally elevated troponin level.  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  However, his recurrent symptoms of chest pain is concerning.  ECG is without any signs of myocardial ischemia/infarction.  He is hemodynamically stable and without chest pain at this time.  He has received aspirin and was given full dose Lovenox.   - Start heparin drip. - Trend troponin level. - Check echocardiogram in the morning. - Potential cardiac catheterization, will make NPO.    # Coronary artery disease Patient with known multivessel coronary artery disease status post bypass surgery and drug eluting stent.  He is here now with chest pain that is concerning for angina, and potentially suffering from NSTEM-ACS.  He is currently on guideline directed medical therapy at this time.  - Hold metoprolol for now due to bradycardia. - Continue home aspirin and Lipitor.   # Bradycardia Patient with sinus bradycardia in the setting of taking metoprolol and potential NSTEMI ACS.  He is asymptomatic at this time.   - Hold metoprolol  # Renal failure Patient with an elevated creatinine, but unclear if this is his new baseline (creatinine at outside hospital 1.75).  Patient's history and symptoms does not correlate with acute kidney injury.  Likely chronic renal failure.   - Monitor BMP. - Start IV fluids - Avoid additional nephrotoxins at this time.   - Hold lisinopril for now as additional labs come back.    # Hypertension Patient with known essential hypertension.  Blood pressure is elevated at this  time above goal.  He does have some underlying renal failure, potentially related to hypertension. - Continue home blood pressure medications.    # Hypothyroidism Patient with past medical history  of hypothyroidism. - Continue home levothyroxine.  # Prophylaxis - Heparin drip  Signed, Jon Billings, MD 09/14/2016, 2:41 AM    left internal  mammary artery graft to the left anterior descending coronary artery,  with a saphenous vein graft to the second diagonal branch of the left  anterior descending, a sequential saphenous vein graft to the first  diagonal branch of the left anterior descending and the obtuse marginal  branch of the left circumflex coronary artery, and a saphenous vein  graft to the posterolateral branch of the right coronary artery

## 2016-09-14 NOTE — Progress Notes (Signed)
[place] for Heparin Indication: NSTEMI  Allergies  Allergen Reactions  . Cardizem Cd [Diltiazem Hcl Er Beads] Other (See Comments)    Makes heart skip    Patient Measurements: Height: 5\' 9"  (175.3 cm) Weight: 204 lb 3.2 oz (92.6 kg) IBW/kg (Calculated) : 70.7 Heparin Dosing Weight: 92 kg  Vital Signs: Temp: 97.8 F (36.6 C) (05/02 0743) Temp Source: Oral (05/02 0743) BP: 142/52 (05/02 1345) Pulse Rate: 44 (05/02 1350)  Labs:  Recent Labs  09/14/16 0338 09/14/16 0534 09/14/16 0927  HGB  --  11.8*  --   HCT  --  34.9*  --   PLT  --  184  --   LABPROT  --   --  14.5  INR  --   --  1.12  CREATININE 1.59*  --   --   TROPONINI 0.33*  --  0.43*    Estimated Creatinine Clearance: 48.6 mL/min (A) (by C-G formula based on SCr of 1.59 mg/dL (H)).   Medical History: Past Medical History:  Diagnosis Date  . Acute myocardial infarction of other inferior wall, initial episode of care   . Anginal pain ([place])   . Anxiety   . Arthritis   . Cancer [facility]    bladder 2013 removed, has cystoscopy 10/2016, has returned x 2  . COPD (chronic obstructive pulmonary disease) ([place])   . Coronary artery disease    Artery bypass graft Jan 2008  . Dyspnea   . Dysrhythmia   . GERD (gastroesophageal reflux disease)   . Heart murmur   . History of hiatal hernia   . Hyperlipidemia   . Hypertension   . Hypothyroidism   . PONV (postoperative nausea and vomiting)   . Sleep apnea   . Stroke ([place])   . Tobacco user     Medications:  Prescriptions Prior to Admission  Medication Sig Dispense Refill Last Dose  . amLODipine (NORVASC) 10 MG tablet Take 10 mg by mouth daily.   09/13/2016 at Unknown time  . aspirin 81 MG chewable tablet Chew 1 tablet (81 mg total) by mouth daily.   09/13/2016 at Unknown time  . atorvastatin (LIPITOR) 80 MG tablet Take 1 tablet (80 mg total) by mouth daily. 30 tablet 6 09/13/2016 at Unknown time  . ergocalciferol (VITAMIN D2)  50000 UNITS capsule Take 50,000 Units by mouth once a week.   Past Week at Unknown time  . hydrochlorothiazide (HYDRODIURIL) 25 MG tablet Take 25 mg by mouth daily.   09/13/2016 at Unknown time  . isosorbide dinitrate (ISORDIL) 30 MG tablet Take 30 mg by mouth 4 (four) times daily.     . isosorbide mononitrate (IMDUR) 30 MG 24 hr tablet Take 0.5 tablets (15 mg total) by mouth daily. 45 tablet 3 09/13/2016 at Unknown time  . levothyroxine (SYNTHROID, LEVOTHROID) 25 MCG tablet Take 25 mcg by mouth daily before breakfast.   09/13/2016 at Unknown time  . lisinopril (PRINIVIL,ZESTRIL) 40 MG tablet Take 1 tablet (40 mg total) by mouth daily. 90 tablet 3 09/13/2016 at Unknown time  . metoprolol succinate (TOPROL-XL) 25 MG 24 hr tablet Take 25 mg by mouth daily.   09/13/2016 at Unknown time  . nitroGLYCERIN (NITROSTAT) 0.4 MG SL tablet Place 1 tablet (0.4 mg total) under the tongue every 5 (five) minutes x 3 doses as needed for chest pain. 25 tablet 3 09/13/2016 at Unknown time  . Omega-3 Fatty Acids (RA FISH OIL) 1000 MG CAPS Take 2 capsules by  mouth 2 (two) times daily.    09/13/2016 at Unknown time  . omeprazole (PRILOSEC) 20 MG capsule Take 40 mg by mouth daily.    09/13/2016 at Unknown time    Assessment: 71 y.o. M presented to [facility] for CP. Received Lovenox 95mg  SQ 5/1 ~1900. Transferred to [facility] for cardiac workup. To start heparin gtt per pharmacy.  Pharmacy is consulted to restart heparin 8h after sheath removal. Per note, sheath was removed at approximately 1345.  Goal of Therapy:  Heparin level 0.3-0.7 units/ml Monitor platelets by anticoagulation protocol: Yes   Plan:  Restart heparin 1250 units/hr 8h HL Daily heparin level and CBC  Andrey Cota. Diona Foley, PharmD, BCPS Clinical Pharmacist [phone] 09/14/2016,2:11 PM

## 2016-09-14 NOTE — Progress Notes (Signed)
   09/14/16 0933  Clinical Encounter Type  Visited With Patient and family together  Visit Type Initial  Referral From Patient  Consult/Referral To Chaplain  Recommendations (follow up )  Spiritual Encounters  Spiritual Needs Literature;Brochure  Stress Factors  Patient Stress Factors None identified  Family Stress Factors None identified  Advance Directives (For Healthcare)  Does Patient Have a Medical Advance Directive? No  Would patient like information on creating a medical advance directive? Yes (Inpatient - patient requests chaplain consult to create a medical advance directive)  [place] Directives  Does Patient Have a Mental Health Advance Directive? No  Pt requested AD, will fill out when finished with CATH, will call spiritual wellness and care.  Chaplain Aribella Vavra A. Nansi Birmingham, MA-PC, BA-REL/PHIL, [phone]

## 2016-09-14 NOTE — Progress Notes (Signed)
Progress Note  Patient Name: [name] Date of Encounter: 09/14/2016  Primary Cardiologist: [name]  Subjective   No chest pain at this time. Breathing is ok.   Inpatient Medications    Scheduled Meds: . amLODipine  10 mg Oral Daily  . [START ON 09/15/2016] aspirin EC  81 mg Oral Daily  . atorvastatin  80 mg Oral q1800  . hydrochlorothiazide  25 mg Oral Daily  . isosorbide mononitrate  15 mg Oral Daily  . levothyroxine  25 mcg Oral QAC breakfast  . omega-3 acid ethyl esters  1 g Oral BID  . pantoprazole  40 mg Oral Daily   Continuous Infusions: . sodium chloride 75 mL/hr at 09/14/16 0449  . heparin 1,250 Units/hr (09/14/16 2353)   PRN Meds: acetaminophen, nitroGLYCERIN, ondansetron (ZOFRAN) IV   Vital Signs    Vitals:   09/14/16 0204 09/14/16 0400 09/14/16 0743  BP: (!) 170/61 (!) 136/54   Pulse: (!) 52 (!) 50 (!) 47  Resp: (!) 23 (!) 21 14  Temp: 97.8 F (36.6 C) 98.2 F (36.8 C)   SpO2: 91% 96% 97%  Weight: 204 lb 3.2 oz (92.6 kg)    Height: 5\' 9"  (1.753 m)      Intake/Output Summary (Last 24 hours) at 09/14/16 0745 Last data filed at 09/14/16 0401  Gross per 24 hour  Intake                0 ml  Output                1 ml  Net               -1 ml   Filed Weights   09/14/16 0204  Weight: 204 lb 3.2 oz (92.6 kg)    Telemetry    SB - Personally Reviewed  ECG    SB - Personally Reviewed  Physical Exam   General: Well developed, well nourished, obese male appearing in no acute distress. Head: Normocephalic, atraumatic.  Neck: Supple without bruits, JVD. Lungs:  Resp regular and unlabored, CTA. Heart: RRR, S1, S2, no S3, S4, or murmur; no rub. Abdomen: Soft, non-tender, non-distended with normoactive bowel sounds. No hepatomegaly. No rebound/guarding. No obvious abdominal masses. Extremities: No clubbing, cyanosis, edema. Distal pedal pulses are 2+ bilaterally. Neuro: Alert and oriented X 3. Moves all extremities spontaneously. Psych: Normal  affect.  Labs    Chemistry Recent Labs Lab 09/14/16 0338  NA 137  K 3.8  CL 105  CO2 24  GLUCOSE 102*  BUN 28*  CREATININE 1.59*  CALCIUM 8.6*  GFRNONAA 42*  GFRAA 49*  ANIONGAP 8     Hematology Recent Labs Lab 09/14/16 0534  WBC 6.4  RBC 4.17*  HGB 11.8*  HCT 34.9*  MCV 83.7  MCH 28.3  MCHC 33.8  RDW 14.0  PLT 184    Cardiac Enzymes Recent Labs Lab 09/14/16 0338  TROPONINI 0.33*   No results for input(s): TROPIPOC in the last 168 hours.   BNPNo results for input(s): BNP, PROBNP in the last 168 hours.   DDimer No results for input(s): DDIMER in the last 168 hours.    Radiology    No results found.  Cardiac Studies    Patient Profile     71 y.o. male with PMH f CAD s/p 4v CABG in 2008, with DES placed to SVG-RCA in 2015, PAD, TIA, HTN, Bladder Ca and OSA who was transferred from [facility] for NSTEMI.   Assessment &  Plan    1. Unstable Angina: Reports he has been having intermittent chest pressure over the past couple of weeks with rest and exertion. Similar to that he experienced with previous stent back in 2015. Appears to have had a cath in 11/17 showing occluded SVG-RCA stent, CTO of the LAD and diffuse disease noted in the Lcx. Trop 0.33 thus far. No active chest pain, but reports that pain develops with slightest activity. Some atypical features noted.  -- currently on IV heparin -- BB held on admission due to bradycardia -- continue statin, ASA -- plan for cardiac catheterization today. The patient understands that risks included but are not limited to stroke (1 in 1000), death (1 in 80), kidney failure [usually temporary] (1 in 500), bleeding (1 in 200), allergic reaction [possibly serious] (1 in 200).  -- echo pending  2. HTN: BB held in the setting of bradycardia. Hypertensive this admission, though BB and ACEi held. Will further address post cath.   3. HL: on statin, and lovaza. LDL 127  4. CKD: Baseline appears around 1.1, was 1.75  at [facility]. 1.57 this morning.  -- lisinopril held  Signed, Reino Bellis, NP  09/14/2016, 7:45 AM    I have personally seen and examined this patient with Harlan Stains, NP. I agree with the assessment and plan as outlined above. Plan cardiac cath today.  Lauree Chandler 09/14/2016 1:56 PM

## 2016-09-14 NOTE — Care Management Note (Addendum)
Case Management Note  Patient Details  Name: [name] MRN: [id number] Date of Birth: 1946-03-11  Subjective/Objective:   Pt presented for Chest Pain-Nstemi. Plan for cardiac cath 09-14-16. Pt is from home with wife and plans to return home once stable. Pt has DME cane and RW, however he does not use at this time. Pt utilizes the [facility] for medications. All cardiac medications are free and all other medications range from $8.00. Pt's PCP is at the [facility] [phone] Cleophas Dunker. Office asked to please fax information H&P / D/C Summary to  [phone] once stable for d/c.            Action/Plan: Orlean Bradford is aware that pt is hospitalized. CM did leave information with Colletta Maryland. CM will continue to monitor.    Expected Discharge Date:  09/17/16               Expected Discharge Plan:  Home/Self Care  In-House Referral:  NA  Discharge planning Services  CM Consult  Post Acute Care Choice:  NA Choice offered to:  NA  DME Arranged:  N/A DME Agency:  NA  HH Arranged:  NA HH Agency:  NA  Status of Service:  Completed, signed off  If discussed at [place] of Stay Meetings, dates discussed:    Additional Comments:  Bethena Roys, RN 09/14/2016, 10:41 AM

## 2016-09-14 NOTE — Consult Note (Signed)
[address]       [place] [address]             [phone]      Cardiothoracic Surgery Consultation  Reason for Consult: Severe native 3-vessel coronary artery disease and severe saphenous vein graft disease Referring Physician: Dr. West Bali  [name] is an 71 y.o. male.  HPI:   The patient is a 71 year old gentleman with a history of hypertension, hyperlipidemia, hypothyroidism, ongoing 1 ppd smoking with COPD, and CAD s/p CABG x 5 by me in 2008. He had a LIMA to the LAD, SVG to D2, sequential SVG to D1 and OM and a SVG to the PL RCA. He subsequently underwent PCI with DES to the RCA vein graft in 2015. He reports that last year he had about a month of frequent substernal chest pain with exertion and thought it was indigestion. He was followed at the [facility] and had a cardiac workup including a cath. He says that he was told that everything was stable and there was no significant blockages. He was ok until the past 3 weeks when he has had recurrent chest pain. He has been woken up at night by chest pain every night. Then last night he had severe constant chest pain and went to [facility]. This was associated with nausea and diaphoresis. He took NTG and it helped but the pain recurred quickly and a second NTG did not help so he went to the hospital. His troponin here was 0.33 and repeats were 0.43 and 0.39. He underwent cath today showing severe native 3-vessel CAD. There was a patent LIMA to the LAD. The SVG to the PL RCA is chronically occluded and the PL system is small. The SVG to D1 and OM has a 99% stenosis near the ostium with diffuse disease and slow flow to the OM. The SVG to D2 is diffusely diseased with 95% stenosis in the distal third of the graft and 50% stenosis prior to its anastomosis. The LVEF is 45-50%.   Past Medical History:  Diagnosis Date  . Acute myocardial infarction of other inferior wall, initial episode of care   . Anginal pain ([place])   .  Anxiety   . Arthritis   . Cancer [facility])    bladder 2013 removed, has cystoscopy 10/2016, has returned x 2  . COPD (chronic obstructive pulmonary disease) ([place])   . Coronary artery disease    Artery bypass graft Jan 2008  . Dyspnea   . Dysrhythmia   . GERD (gastroesophageal reflux disease)   . Heart murmur   . History of hiatal hernia   . Hyperlipidemia   . Hypertension   . Hypothyroidism   . PONV (postoperative nausea and vomiting)   . Sleep apnea   . Stroke ([place])   . Tobacco user     Past Surgical History:  Procedure Laterality Date  . APPENDECTOMY    . CARDIAC CATHETERIZATION    . CORONARY ARTERY BYPASS GRAFT  06/14/2006   x 5  . FOOT SURGERY    . HERNIA REPAIR    . LEFT HEART CATHETERIZATION WITH CORONARY/GRAFT ANGIOGRAM N/A 01/31/2014   Procedure: LEFT HEART CATHETERIZATION WITH Beatrix Fetters;  Surgeon: Sanda Klein, MD;  Location: [place] CATH LAB;  Service: Cardiovascular;  Laterality: N/A;  . NOSE SURGERY    . PERCUTANEOUS CORONARY STENT INTERVENTION (PCI-S)  01/31/2014   Procedure: PERCUTANEOUS CORONARY STENT INTERVENTION (PCI-S);  Surgeon: Sanda Klein,  MD;  Location: [facility] CATH LAB;  Service: Cardiovascular;;    Family History  Problem Relation Age of Onset  . Heart attack Mother   . Heart attack Brother     Social History:  reports that he has been smoking Cigarettes.  He started smoking about 57 years ago. He has a 40.00 pack-year smoking history. He has never used smokeless tobacco. He reports that he does not drink alcohol or use drugs.  Allergies:  Allergies  Allergen Reactions  . Cardizem Cd [Diltiazem Hcl Er Beads] Other (See Comments)    Makes heart skip    Medications:  I have reviewed the patient's current medications. Prior to Admission:  Prescriptions Prior to Admission  Medication Sig Dispense Refill Last Dose  . amLODipine (NORVASC) 10 MG tablet Take 10 mg by mouth daily.   09/13/2016 at Unknown time  . aspirin 81 MG chewable tablet  Chew 1 tablet (81 mg total) by mouth daily.   09/13/2016 at Unknown time  . atorvastatin (LIPITOR) 80 MG tablet Take 1 tablet (80 mg total) by mouth daily. 30 tablet 6 09/13/2016 at Unknown time  . ergocalciferol (VITAMIN D2) 50000 UNITS capsule Take 50,000 Units by mouth once a week.   Past Week at Unknown time  . hydrochlorothiazide (HYDRODIURIL) 25 MG tablet Take 25 mg by mouth daily.   09/13/2016 at Unknown time  . isosorbide dinitrate (ISORDIL) 30 MG tablet Take 30 mg by mouth 4 (four) times daily.     . isosorbide mononitrate (IMDUR) 30 MG 24 hr tablet Take 0.5 tablets (15 mg total) by mouth daily. 45 tablet 3 09/13/2016 at Unknown time  . levothyroxine (SYNTHROID, LEVOTHROID) 25 MCG tablet Take 25 mcg by mouth daily before breakfast.   09/13/2016 at Unknown time  . lisinopril (PRINIVIL,ZESTRIL) 40 MG tablet Take 1 tablet (40 mg total) by mouth daily. 90 tablet 3 09/13/2016 at Unknown time  . metoprolol succinate (TOPROL-XL) 25 MG 24 hr tablet Take 25 mg by mouth daily.   09/13/2016 at Unknown time  . nitroGLYCERIN (NITROSTAT) 0.4 MG SL tablet Place 1 tablet (0.4 mg total) under the tongue every 5 (five) minutes x 3 doses as needed for chest pain. 25 tablet 3 09/13/2016 at Unknown time  . Omega-3 Fatty Acids (RA FISH OIL) 1000 MG CAPS Take 2 capsules by mouth 2 (two) times daily.    09/13/2016 at Unknown time  . omeprazole (PRILOSEC) 20 MG capsule Take 40 mg by mouth daily.    09/13/2016 at Unknown time   Scheduled: . amLODipine  10 mg Oral Daily  . aspirin  81 mg Oral Daily  . [START ON 09/15/2016] aspirin EC  81 mg Oral Daily  . atorvastatin  80 mg Oral q1800  . hydrochlorothiazide  25 mg Oral Daily  . isosorbide mononitrate  15 mg Oral Daily  . levothyroxine  25 mcg Oral QAC breakfast  . omega-3 acid ethyl esters  1 g Oral BID  . pantoprazole  40 mg Oral Daily  . sodium chloride flush  3 mL Intravenous Q12H   Continuous: . sodium chloride 75 mL/hr at 09/14/16 0449  . sodium chloride 800 mL (09/14/16  1305)  . sodium chloride    . heparin     IRW:ERXVQM chloride, acetaminophen, diazepam, nitroGLYCERIN, ondansetron (ZOFRAN) IV, sodium chloride flush Anti-infectives    None      Results for orders placed or performed during the hospital encounter of 09/14/16 (from the past 48 hour(s))  Surgical PCR screen  Status: None   Collection Time: 09/14/16  2:17 AM  Result Value Ref Range   MRSA, PCR NEGATIVE NEGATIVE   Staphylococcus aureus NEGATIVE NEGATIVE    Comment:        The Xpert SA Assay (FDA approved for NASAL specimens in patients over 41 years of age), is one component of a comprehensive surveillance program.  Test performance has been validated by [facility] for patients greater than or equal to 72 year old. It is not intended to diagnose infection nor to guide or monitor treatment.   Troponin I     Status: Abnormal   Collection Time: 09/14/16  3:38 AM  Result Value Ref Range   Troponin I 0.33 (HH) <0.03 ng/mL    Comment: CRITICAL RESULT CALLED TO, READ BACK BY AND VERIFIED WITH: KNICK M,RN 09/14/16 [facility] metabolic panel     Status: Abnormal   Collection Time: 09/14/16  3:38 AM  Result Value Ref Range   Sodium 137 135 - 145 mmol/L   Potassium 3.8 3.5 - 5.1 mmol/L   Chloride 105 101 - 111 mmol/L   CO2 24 22 - 32 mmol/L   Glucose, Bld 102 (H) 65 - 99 mg/dL   BUN 28 (H) 6 - 20 mg/dL   Creatinine, Ser 1.59 (H) 0.61 - 1.24 mg/dL   Calcium 8.6 (L) 8.9 - 10.3 mg/dL   GFR calc non Af Amer 42 (L) >60 mL/min   GFR calc Af Amer 49 (L) >60 mL/min    Comment: (NOTE) The eGFR has been calculated using the CKD EPI equation. This calculation has not been validated in all clinical situations. eGFR's persistently <60 mL/min signify possible Chronic Kidney Disease.    Anion gap 8 5 - 15  Lipid panel     Status: Abnormal   Collection Time: 09/14/16  3:38 AM  Result Value Ref Range   Cholesterol 194 0 - 200 mg/dL   Triglycerides 218 (H) <150 mg/dL   HDL 23  (L) >40 mg/dL   Total CHOL/HDL Ratio 8.4 RATIO   VLDL 44 (H) 0 - 40 mg/dL   LDL Cholesterol 127 (H) 0 - 99 mg/dL    Comment:        Total Cholesterol/HDL:CHD Risk Coronary Heart Disease Risk Table                     Men   Women  1/2 Average Risk   3.4   3.3  Average Risk       5.0   4.4  2 X Average Risk   9.6   7.1  3 X Average Risk  23.4   11.0        Use the calculated Patient Ratio above and the CHD Risk Table to determine the patient's CHD Risk.        ATP III CLASSIFICATION (LDL):  <100     mg/dL   Optimal  100-129  mg/dL   Near or Above                    Optimal  130-159  mg/dL   Borderline  160-189  mg/dL   High  >190     mg/dL   Very High   CBC     Status: Abnormal   Collection Time: 09/14/16  5:34 AM  Result Value Ref Range   WBC 6.4 4.0 - 10.5 K/uL   RBC 4.17 (L) 4.22 - 5.81 MIL/uL  Hemoglobin 11.8 (L) 13.0 - 17.0 g/dL   HCT 34.9 (L) 39.0 - 52.0 %   MCV 83.7 78.0 - 100.0 fL   MCH 28.3 26.0 - 34.0 pg   MCHC 33.8 30.0 - 36.0 g/dL   RDW 14.0 11.5 - 15.5 %   Platelets 184 150 - 400 K/uL  Troponin I     Status: Abnormal   Collection Time: 09/14/16  9:27 AM  Result Value Ref Range   Troponin I 0.43 (HH) <0.03 ng/mL    Comment: CRITICAL VALUE NOTED.  VALUE IS CONSISTENT WITH PREVIOUSLY REPORTED AND CALLED VALUE.  Protime-INR     Status: None   Collection Time: 09/14/16  9:27 AM  Result Value Ref Range   Prothrombin Time 14.5 11.4 - 15.2 seconds   INR 1.12   POCT Activated clotting time     Status: None   Collection Time: 09/14/16 12:34 PM  Result Value Ref Range   Activated Clotting Time 125 seconds  Troponin I     Status: Abnormal   Collection Time: 09/14/16  2:19 PM  Result Value Ref Range   Troponin I 0.39 (HH) <0.03 ng/mL    Comment: CRITICAL VALUE NOTED.  VALUE IS CONSISTENT WITH PREVIOUSLY REPORTED AND CALLED VALUE.    No results found.  Review of Systems  Constitutional: Positive for diaphoresis, malaise/fatigue and weight loss. Negative for  chills and fever.  HENT: Negative.   Eyes: Negative.   Respiratory: Positive for cough and shortness of breath.   Cardiovascular: Positive for chest pain. Negative for orthopnea, leg swelling and PND.  Gastrointestinal: Positive for nausea.  Genitourinary: Negative.   Musculoskeletal: Negative.   Skin: Negative.   Neurological: Negative for dizziness, focal weakness and loss of consciousness.  Endo/Heme/Allergies: Negative.   Psychiatric/Behavioral: Negative.    Blood pressure (!) 155/72, pulse (!) 50, temperature 97.4 F (36.3 C), temperature source Oral, resp. rate 19, height '5\' 9"'$  (1.753 m), weight 92.6 kg (204 lb 3.2 oz), SpO2 98 %. Physical Exam  Constitutional: He is oriented to person, place, and time. He appears well-developed and well-nourished. No distress.  HENT:  Head: Normocephalic and atraumatic.  Mouth/Throat: Oropharynx is clear and moist.  Eyes: Conjunctivae and EOM are normal. Pupils are equal, round, and reactive to light.  Neck: Normal range of motion. Neck supple. No JVD present. No thyromegaly present.  Cardiovascular: Normal rate, regular rhythm, normal heart sounds and intact distal pulses.   No murmur heard. Respiratory: Effort normal. No respiratory distress. He has no wheezes.  Distant breath sounds throughout  Increased AP dimension of chest  GI: Soft. Bowel sounds are normal. He exhibits no distension and no mass. There is no tenderness.  Musculoskeletal: Normal range of motion. He exhibits no edema.  Lymphadenopathy:    He has no cervical adenopathy.  Neurological: He is alert and oriented to person, place, and time. He has normal strength. No cranial nerve deficit or sensory deficit.  Skin: Skin is warm and dry.  Psychiatric: He has a normal mood and affect.   DEVAN DANZER  Cardiac catheterization  Order# [id number]  Reading physician: Troy Sine, MD Ordering physician: Troy Sine, MD Study date: 09/14/16  Physicians   Panel Physicians  Referring Physician Case Authorizing Physician  Troy Sine, MD (Primary)    Procedures   Left Heart Cath and Cors/Grafts Angiography  Conclusion     The left ventricular ejection fraction is 45-50% by visual estimate.  LV end diastolic pressure is normal.  There is mild left ventricular systolic dysfunction.  Dist RCA lesion, 90 %stenosed.  SVG.  Dist Graft-1 lesion, 95 %stenosed.  Dist Graft-2 lesion, 50 %stenosed.  Prox Graft lesion, 80 %stenosed.  Ost 2nd Mrg lesion, 100 %stenosed.  SVG.  Mid Graft lesion, 100 %stenosed.  Prox Graft lesion, 100 %stenosed.  Seq SVG-.  Origin lesion, 99 %stenosed.  Prox LAD lesion, 100 %stenosed.  Prox Graft to Dist Graft lesion, 25 %stenosed.  Ost LAD to Prox LAD lesion, 80 %stenosed.   Multivessel native CAD with diffuse 80% proximal LAD stenosis and occlusion of the LAD after a small first diagonal vessel; occluded marginal branch arising from the mid AV groove circumflex with an otherwise patent AV groove circumflex extending distally; RCA with 20% mid narrowing normal PDA, but with 90% PLA stenosis.  Patent LIMA graft supplying the mid LAD.  SVG sequentially supplying the diagonal-1 and OM vessel with 99% near ostial graft stenosis and diffuse luminal irregularity in the body of the graft prior to the initial anastomosis.  SVG supplying the diagonal 2 vessel with diffuse proximal to mid graft stenosis, 95% ulcerated plaque stenosis in the distal third of the graft and 50% eccentric stenosis prior to its anastomosis.  SVG which had previously supplied the PLA and had been stented, is occluded proximally.  Mild-to-moderate LV dysfunction with mid distal anterolateral hypocontractility and an ejection fraction of 45-50%.  RECOMMENDATION: The patient had previously undergone CABG surgery by Dr. Cyndia Bent in 2008.  Dr. Cyndia Bent will be re-consulted for evaluation and consideration of possible redo CABG revascularization  surgery.     Indications   Non-ST elevation (NSTEMI) myocardial infarction (HCC) [I21.4 (ICD-10-CM)]  Coronary artery disease involving coronary bypass graft of native heart with unstable angina pectoris ([place]) [I25.700 (ICD-10-CM)]  Procedural Details/Technique   Technical Details [name] is a 71 year old Caucasian male who underwent CABG revascularization surgery in 2008 by Dr. Cyndia Bent with a LIMA to the LAD, SVG to the diagonal 2, sequential SVG to the diagonal 1 and OM, and SVG to PL. In 2015, he underwent stenting to the vein graft to the RCA. He tells me last year he underwent a cardiac catheterization at the [facility] and the stent was occluded. He recently developed unstable angina symptoms and was admitted to the hospital. Troponins are mildly elevated. He is referred for cardiac catheterization.  The patient arrived in the catheterization laboratory in the fasting state. He was premedicated with Versed and fentanyl for conscious sedation. His right femoral artery was punctured anteriorly and a 5 French sheath was inserted without difficulty. Diagnostic catheterization was done using 5 French Judkins 4 left and right diagnostic catheters. The right catheter was used for selective angiography into his vein grafts but a right bypass graft catheter was necessary for selective angiography into the right bypass graft. A Glidewire was necessary to navigate up and around the subclavian artery and the LIMA was injected with a 5 French LIMA catheter. A pigtail catheter was used for left ventricular pressure recording. Hemostasis was obtained by direct manual pressure. The patient tolerated the procedure well and returned to his room in satisfactory condition.   Estimated blood loss <50 mL.  During this procedure the patient was administered the following to achieve and maintain moderate conscious sedation: Versed 3 mg, Fentanyl 75 mcg, while the patient's heart rate, blood pressure, and  oxygen saturation were continuously monitored. The period of conscious sedation was 56 minutes, of which I was present face-to-face 100% of this  time.    Coronary Findings   Dominance: Right  Left Anterior Descending  Ost LAD to Prox LAD lesion, 80% stenosed.  Prox LAD lesion, 100% stenosed.  Left Circumflex  Second Obtuse Marginal Branch  Ost 2nd Mrg lesion, 100% stenosed.  Right Coronary Artery  Dist RCA lesion, 90% stenosed.  Graft Angiography  saphenous Graft to 2nd Diag  SVG.  Prox Graft lesion, 80% stenosed.  Dist Graft-1 lesion, 95% stenosed.  Dist Graft-2 lesion, 50% stenosed.  saphenous Graft to Dist RCA  SVG.  Prox Graft lesion, 100% stenosed.  Mid Graft lesion, 100% stenosed. The lesion was previously treated.  Free Graft to 2nd Mrg  Seq SVG-.  Origin lesion, 99% stenosed.  Prox Graft to Dist Graft lesion, 25% stenosed.  LIMA Graft to Dist LAD  Wall Motion              Left Heart   Left Ventricle The left ventricular size is normal. There is mild left ventricular systolic dysfunction. LV end diastolic pressure is normal. The left ventricular ejection fraction is 45-50% by visual estimate. There are LV function abnormalities due to segmental dysfunction. There is mild LV dysfunction with mid distal anterolateral hypocontractility and an ejection fraction of 45-50%.    Coronary Diagrams   Diagnostic Diagram       Implants     No implant documentation for this case.  PACS Images   Show images for Cardiac catheterization   Link to Procedure Log   Procedure Log    Hemo Data    Most Recent Value  AO Systolic Pressure 654 mmHg  AO Diastolic Pressure 44 mmHg  AO Mean 69 mmHg  LV Systolic Pressure 650 mmHg  LV Diastolic Pressure 2 mmHg  LV EDP 11 mmHg  Arterial Occlusion Pressure Extended Systolic Pressure 354 mmHg  Arterial Occlusion Pressure Extended Diastolic Pressure 46 mmHg  Arterial Occlusion Pressure Extended Mean Pressure 75 mmHg  Left  Ventricular Apex Extended Systolic Pressure 656 mmHg  Left Ventricular Apex Extended Diastolic Pressure 5 mmHg  Left Ventricular Apex Extended EDP Pressure 14 mmHg    Assessment/Plan:  This 71 year old gentleman has severe native 3-vessel CAD with diffusely diseased and severely stenosed vein grafts to the branching D2 and OM with unstable angina and NSTEMI. I have reviewed his previous operative note and the D2 was diffusely diseased but the OM was a large vessel. Both of these appear to be small to moderate sized vessels on cath and are smaller than the catheter but look like they may be graftable. The RCA system only has a significant stenosis distally before the PL and the PL, which was grafted before is too small to graft again. I used the right saphenous vein for his initial surgery after examining the left saphenous vein and finding it to be small, branching and not suitable. Therefore he probably does not have any suitable saphenous vein remaining. We will get arterial dopplers to evaluate the palmar arches to decide if a radial artery can be used and could probably use the RIMA for one bypass. I think his operative risk is high given his age, redo status, stage III CKD with creatinine of 1.59 this am, ongoing heavy smoking for many years and probably severe COPD. We will check PFT's and a room air ABG to further assess his risk preop. I discussed the cath findings with the patient and his wife and the further workup including echo, arterial dopplers, PFT's and a RA ABG. I  told him that I would not commit to operating on him until I see these results.  I spent 60 minutes performing this consultation and > 50% of this time was spent face to face counseling and coordinating the care of this patient's severe native multi-vessel and SVG disease with unstable angina and NSTEMI.  Fernande Boyden Bartle 09/14/2016, 8:16 PM

## 2016-09-15 ENCOUNTER — Inpatient Hospital Stay (HOSPITAL_COMMUNITY): Payer: Medicare Other

## 2016-09-15 ENCOUNTER — Other Ambulatory Visit: Payer: Self-pay | Admitting: *Deleted

## 2016-09-15 ENCOUNTER — Encounter (HOSPITAL_COMMUNITY): Payer: Self-pay | Admitting: Cardiovascular Disease

## 2016-09-15 DIAGNOSIS — I214 Non-ST elevation (NSTEMI) myocardial infarction: Secondary | ICD-10-CM

## 2016-09-15 DIAGNOSIS — I25110 Atherosclerotic heart disease of native coronary artery with unstable angina pectoris: Secondary | ICD-10-CM

## 2016-09-15 DIAGNOSIS — I2510 Atherosclerotic heart disease of native coronary artery without angina pectoris: Secondary | ICD-10-CM

## 2016-09-15 LAB — BASIC METABOLIC PANEL
ANION GAP: 9 (ref 5–15)
BUN: 22 mg/dL — ABNORMAL HIGH (ref 6–20)
CALCIUM: 8.2 mg/dL — AB (ref 8.9–10.3)
CHLORIDE: 104 mmol/L (ref 101–111)
CO2: 23 mmol/L (ref 22–32)
Creatinine, Ser: 1.33 mg/dL — ABNORMAL HIGH (ref 0.61–1.24)
GFR calc non Af Amer: 53 mL/min — ABNORMAL LOW (ref >60)
GLUCOSE: 103 mg/dL — AB (ref 65–99)
Potassium: 3.6 mmol/L (ref 3.5–5.1)
Sodium: 136 mmol/L (ref 135–145)

## 2016-09-15 LAB — CBC
HEMATOCRIT: 34.1 % — AB (ref 39.0–52.0)
HEMOGLOBIN: 11.4 g/dL — AB (ref 13.0–17.0)
MCH: 28.1 pg (ref 26.0–34.0)
MCHC: 33.4 g/dL (ref 30.0–36.0)
MCV: 84.2 fL (ref 78.0–100.0)
Platelets: 180 10*3/uL (ref 150–400)
RBC: 4.05 MIL/uL — ABNORMAL LOW (ref 4.22–5.81)
RDW: 13.9 % (ref 11.5–15.5)
WBC: 6.6 10*3/uL (ref 4.0–10.5)

## 2016-09-15 LAB — HEPARIN LEVEL (UNFRACTIONATED)
HEPARIN UNFRACTIONATED: 0.31 [IU]/mL (ref 0.30–0.70)
Heparin Unfractionated: 0.4 IU/mL (ref 0.30–0.70)

## 2016-09-15 MED ORDER — ISOSORBIDE MONONITRATE ER 30 MG PO TB24: 15.0000 mg | ORAL_TABLET | Freq: Once | ORAL | Status: DC

## 2016-09-15 MED ORDER — ISOSORBIDE MONONITRATE ER 30 MG PO TB24
30.0000 mg | ORAL_TABLET | Freq: Every day | ORAL | Status: DC
  Administered 2016-09-16 – 2016-09-18 (×3): 30 mg via ORAL
  Filled 2016-09-15 (×3): qty 1

## 2016-09-15 MED ORDER — MORPHINE SULFATE (PF) 2 MG/ML IV SOLN
2.0000 mg | Freq: Once | INTRAVENOUS | Status: AC
  Administered 2016-09-15: 2 mg via INTRAVENOUS
  Filled 2016-09-15: qty 1

## 2016-09-15 NOTE — Consult Note (Signed)
[facility] CM Primary Care Navigator  09/15/2016  LEMAN MARTINEK 16-May-1946 [id number]   Met with patient and wife Sula Soda) at the bedside to identify possible discharge needs. Patient reports having severe pain to chest radiating to his left arm that had led to this admission.  Patient endorses Dr. Cleophas Dunker with [facility] (not with [facility]) as his primary care provider.   Patient shared using [facility] to obtain medications without any problem.   Patient reports managing his medications at home using "pill box" system weekly.   He mentioned being able to drive prior to admission but wife will provide transportation to his doctors' appointments after discharge.  Patient states that wife will be his caregiver at home.   Anticipated discharge is home per patient.   Patient voiced understanding to call and schedule a follow-up visit with primary care provider when he returns home, for a post discharge follow-up within a week or sooner if needs arise.    For questions, please contact:  Dannielle Huh, BSN, RN- [facility] Primary Care Navigator  Telephone: [phone] [place]

## 2016-09-15 NOTE — Progress Notes (Signed)
Progress Note  Patient Name: [name] Date of Encounter: 09/15/2016  Primary Cardiologist: Harl Bowie  Subjective   Had an episode of chest pain early this morning, improved with SL and morphine.   Inpatient Medications    Scheduled Meds: . amLODipine  10 mg Oral Daily  . aspirin  81 mg Oral Daily  . aspirin EC  81 mg Oral Daily  . atorvastatin  80 mg Oral q1800  . hydrochlorothiazide  25 mg Oral Daily  . isosorbide mononitrate  15 mg Oral Daily  . levothyroxine  25 mcg Oral QAC breakfast  . omega-3 acid ethyl esters  1 g Oral BID  . pantoprazole  40 mg Oral Daily  . sodium chloride flush  3 mL Intravenous Q12H   Continuous Infusions: . sodium chloride 125 mL/hr (09/14/16 2201)  . sodium chloride    . heparin 1,250 Units/hr (09/14/16 2149)   PRN Meds: sodium chloride, acetaminophen, diazepam, nitroGLYCERIN, ondansetron (ZOFRAN) IV, sodium chloride flush   Vital Signs    Vitals:   09/15/16 0142 09/15/16 0148 09/15/16 0554 09/15/16 0725  BP: (!) 159/70 (!) 163/68 (!) 148/62   Pulse: 79 75 (!) 56   Resp: (!) 21 18 17    Temp:   98.1 F (36.7 C) 97.8 F (36.6 C)  TempSrc:    Oral  SpO2: 95% 91% 96%   Weight:   208 lb 12.8 oz (94.7 kg)   Height:        Intake/Output Summary (Last 24 hours) at 09/15/16 0850 Last data filed at 09/15/16 0555  Gross per 24 hour  Intake          2405.21 ml  Output             1325 ml  Net          1080.21 ml   Filed Weights   09/14/16 0204 09/15/16 0554  Weight: 204 lb 3.2 oz (92.6 kg) 208 lb 12.8 oz (94.7 kg)    Telemetry    SB - Personally Reviewed  ECG    SR mild ST depression in lead I, v3-v6- Personally Reviewed  Physical Exam   General: Well developed, well nourished, male appearing in no acute distress. Head: Normocephalic, atraumatic.  Neck: Supple without bruits, JVD. Lungs:  Resp regular and unlabored, CTA. Heart: RRR, S1, S2, no S3, S4, or murmur; no rub. Abdomen: Soft, non-tender, non-distended with  normoactive bowel sounds. No hepatomegaly. No rebound/guarding. No obvious abdominal masses. Extremities: No clubbing, cyanosis, edema. Distal pedal pulses are 2+ bilaterally. Right FA site with bruising, no hematoma noted.  Neuro: Alert and oriented X 3. Moves all extremities spontaneously. Psych: Normal affect.  Labs    Chemistry Recent Labs Lab 09/14/16 0338 09/15/16 0416  NA 137 136  K 3.8 3.6  CL 105 104  CO2 24 23  GLUCOSE 102* 103*  BUN 28* 22*  CREATININE 1.59* 1.33*  CALCIUM 8.6* 8.2*  GFRNONAA 42* 53*  GFRAA 49* >60  ANIONGAP 8 9     Hematology Recent Labs Lab 09/14/16 0534 09/15/16 0416  WBC 6.4 6.6  RBC 4.17* 4.05*  HGB 11.8* 11.4*  HCT 34.9* 34.1*  MCV 83.7 84.2  MCH 28.3 28.1  MCHC 33.8 33.4  RDW 14.0 13.9  PLT 184 180    Cardiac Enzymes Recent Labs Lab 09/14/16 0338 09/14/16 0927 09/14/16 1419  TROPONINI 0.33* 0.43* 0.39*   No results for input(s): TROPIPOC in the last 168 hours.   BNPNo results for  input(s): BNP, PROBNP in the last 168 hours.   DDimer No results for input(s): DDIMER in the last 168 hours.    Radiology    No results found.  Cardiac Studies   LHC: 09/14/16  Conclusion     The left ventricular ejection fraction is 45-50% by visual estimate.  LV end diastolic pressure is normal.  There is mild left ventricular systolic dysfunction.  Dist RCA lesion, 90 %stenosed.  SVG.  Dist Graft-1 lesion, 95 %stenosed.  Dist Graft-2 lesion, 50 %stenosed.  Prox Graft lesion, 80 %stenosed.  Ost 2nd Mrg lesion, 100 %stenosed.  SVG.  Mid Graft lesion, 100 %stenosed.  Prox Graft lesion, 100 %stenosed.  Seq SVG-.  Origin lesion, 99 %stenosed.  Prox LAD lesion, 100 %stenosed.  Prox Graft to Dist Graft lesion, 25 %stenosed.  Ost LAD to Prox LAD lesion, 80 %stenosed.   Multivessel native CAD with diffuse 80% proximal LAD stenosis and occlusion of the LAD after a small first diagonal vessel; occluded marginal  branch arising from the mid AV groove circumflex with an otherwise patent AV groove circumflex extending distally; RCA with 20% mid narrowing normal PDA, but with 90% PLA stenosis.  Patent LIMA graft supplying the mid LAD.  SVG sequentially supplying the diagonal-1 and OM vessel with 99% near ostial graft stenosis and diffuse luminal irregularity in the body of the graft prior to the initial anastomosis.  SVG supplying the diagonal 2 vessel with diffuse proximal to mid graft stenosis, 95% ulcerated plaque stenosis in the distal third of the graft and 50% eccentric stenosis prior to its anastomosis.  SVG which had previously supplied the PLA and had been stented, is occluded proximally.  Mild-to-moderate LV dysfunction with mid distal anterolateral hypocontractility and an ejection fraction of 45-50%.  RECOMMENDATION: The patient had previously undergone CABG surgery by Dr. Cyndia Bent in 2008.  Dr. Cyndia Bent will be re-consulted for evaluation and consideration of possible redo CABG revascularization surgery.    Patient Profile     71 y.o. male with PMH of CAD s/p 4v CABG in 2008, with DES placed to [place] in 2015, PAD, TIA, HTN, Bladder Ca and OSA who was transferred from [facility] for NSTEMI. Underwent LHC 09/14/16.   Assessment & Plan    1. Unstable Angina: Reports he has been having intermittent chest pressure over the past couple of weeks with rest and exertion. Similar to that he experienced with previous stent back in 2015. Had a cath in 11/17 showing occluded SVG-RCA stent, CTO of the LAD and diffuse disease noted in the Lcx. Trop peaked at 0.43. Underwent cardiac cath with Dr. Claiborne Billings yesterday showing diffuse disease, recommended CVTS consult for redo CABG. Currently undergoing further work up with testing before decision is made regarding surgery. Will increase Imdur 30mg .  -- remains on IV heparin -- BB held on admission due to bradycardia -- continue statin, ASA -- echo  pending  2. HTN: BB held in the setting of bradycardia. Hypertensive this admission, though BB and ACEi held.   3. HL: on statin, and lovaza. LDL 127  4. CKD: Baseline appears around 1.1, was 1.75 at [facility]. Cr 1.33 today. -- lisinopril held, likely restart tomorrow.   Signed, Reino Bellis, NP  09/15/2016, 8:50 AM    I have personally seen and examined this patient with Reino Bellis, NP. I agree with the assessment and plan as outlined above. He is admitted with a NSTEMI. Cardiac cath 09/14/16 with severe stenosis in the SVG to OM, SVG to Diagonal,  occlusion of SVG to RCA. The LIMA to small LAD is patent. Dr. Claiborne Billings has recommended CT surgery consult for CABG. Dr. Cyndia Bent has seen him and further testing is required to look for adequate grafting conduit. He is having no chest pain today.   Lauree Chandler 09/15/2016 10:44 AM

## 2016-09-15 NOTE — Progress Notes (Signed)
  Echocardiogram 2D Echocardiogram has been performed.  [name] M 09/15/2016, 10:21 AM

## 2016-09-15 NOTE — Progress Notes (Signed)
[place] for Heparin Indication: NSTEMI  Allergies  Allergen Reactions  . Cardizem Cd [Diltiazem Hcl Er Beads] Other (See Comments)    Makes heart skip    Patient Measurements: Height: 5\' 9"  (175.3 cm) Weight: 208 lb 12.8 oz (94.7 kg) IBW/kg (Calculated) : 70.7 Heparin Dosing Weight: 92 kg  Vital Signs: Temp: 98.1 F (36.7 C) (05/03 0554) BP: 148/62 (05/03 0554) Pulse Rate: 56 (05/03 0554)  Labs:  Recent Labs  09/14/16 0338 09/14/16 0534 09/14/16 0927 09/14/16 1419 09/15/16 0416 09/15/16 0536  HGB  --  11.8*  --   --  11.4*  --   HCT  --  34.9*  --   --  34.1*  --   PLT  --  184  --   --  180  --   LABPROT  --   --  14.5  --   --   --   INR  --   --  1.12  --   --   --   HEPARINUNFRC  --   --   --   --   --  0.40  CREATININE 1.59*  --   --   --  1.33*  --   TROPONINI 0.33*  --  0.43* 0.39*  --   --     Estimated Creatinine Clearance: 58.7 mL/min (A) (by C-G formula based on SCr of 1.33 mg/dL (H)).   Medical History: Past Medical History:  Diagnosis Date  . Acute myocardial infarction of other inferior wall, initial episode of care   . Anginal pain ([place])   . Anxiety   . Arthritis   . Cancer [facility])    bladder 2013 removed, has cystoscopy 10/2016, has returned x 2  . COPD (chronic obstructive pulmonary disease) ([place])   . Coronary artery disease    Artery bypass graft Jan 2008  . Dyspnea   . Dysrhythmia   . GERD (gastroesophageal reflux disease)   . Heart murmur   . History of hiatal hernia   . Hyperlipidemia   . Hypertension   . Hypothyroidism   . PONV (postoperative nausea and vomiting)   . Sleep apnea   . Stroke ([place])   . Tobacco user     Medications:  Prescriptions Prior to Admission  Medication Sig Dispense Refill Last Dose  . amLODipine (NORVASC) 10 MG tablet Take 10 mg by mouth daily.   09/13/2016 at Unknown time  . aspirin 81 MG chewable tablet Chew 1 tablet (81 mg total) by mouth daily.   09/13/2016 at  Unknown time  . atorvastatin (LIPITOR) 80 MG tablet Take 1 tablet (80 mg total) by mouth daily. 30 tablet 6 09/13/2016 at Unknown time  . ergocalciferol (VITAMIN D2) 50000 UNITS capsule Take 50,000 Units by mouth once a week.   Past Week at Unknown time  . hydrochlorothiazide (HYDRODIURIL) 25 MG tablet Take 25 mg by mouth daily.   09/13/2016 at Unknown time  . isosorbide dinitrate (ISORDIL) 30 MG tablet Take 30 mg by mouth 4 (four) times daily.     . isosorbide mononitrate (IMDUR) 30 MG 24 hr tablet Take 0.5 tablets (15 mg total) by mouth daily. 45 tablet 3 09/13/2016 at Unknown time  . levothyroxine (SYNTHROID, LEVOTHROID) 25 MCG tablet Take 25 mcg by mouth daily before breakfast.   09/13/2016 at Unknown time  . lisinopril (PRINIVIL,ZESTRIL) 40 MG tablet Take 1 tablet (40 mg total) by mouth daily. 90 tablet 3 09/13/2016 at Unknown time  .  metoprolol succinate (TOPROL-XL) 25 MG 24 hr tablet Take 25 mg by mouth daily.   09/13/2016 at Unknown time  . nitroGLYCERIN (NITROSTAT) 0.4 MG SL tablet Place 1 tablet (0.4 mg total) under the tongue every 5 (five) minutes x 3 doses as needed for chest pain. 25 tablet 3 09/13/2016 at Unknown time  . Omega-3 Fatty Acids (RA FISH OIL) 1000 MG CAPS Take 2 capsules by mouth 2 (two) times daily.    09/13/2016 at Unknown time  . omeprazole (PRILOSEC) 20 MG capsule Take 40 mg by mouth daily.    09/13/2016 at Unknown time    Assessment: 71 y.o. M presented to [facility] for CP. Received Lovenox 95mg  SQ 5/1 ~1900. Transferred to [facility] for cardiac workup. Restarting heparin gtt per pharmacy after cath on 5/2 8 hours after sheath removal.  First heparin level after restarting was therapeutic at 0.40 on heparin 1250 units/hr IV. Hemoglobin decreased and platelet count remained within normal limits. No bleeding noted.  Goal of Therapy:  Heparin level 0.3-0.7 units/ml Monitor platelets by anticoagulation protocol: Yes   Plan:  Continue heparin 1250 units/hr 8-hour heparin level to  confirm Daily heparin level and CBC Follow up plans for CABG  Demetrius Charity, PharmD Acute Care Pharmacy Resident  Pager: [phone] 09/15/2016

## 2016-09-15 NOTE — Progress Notes (Signed)
Pt complained of having 6/10 chest pain and was found to be sweaty. EKG obtained and 2 nitros given. Contacted Cards Fellow ordered 2 mg Morphine. Pt's pain has decreased to a 1 but states it gets worse and then better. Cards Fellow made aware. No further changes. Will continue to monitor patient.

## 2016-09-15 NOTE — Progress Notes (Signed)
[place] for Heparin Indication: NSTEMI  Allergies  Allergen Reactions  . Cardizem Cd [Diltiazem Hcl Er Beads] Other (See Comments)    Makes heart skip    Patient Measurements: Height: 5\' 9"  (175.3 cm) Weight: 208 lb 12.8 oz (94.7 kg) IBW/kg (Calculated) : 70.7 Heparin Dosing Weight: 92 kg  Vital Signs: Temp: 97.8 F (36.6 C) (05/03 0725) Temp Source: Oral (05/03 0725) BP: 148/62 (05/03 0554) Pulse Rate: 56 (05/03 0554)  Labs:  Recent Labs  09/14/16 0338 09/14/16 0534 09/14/16 0927 09/14/16 1419 09/15/16 0416 09/15/16 0536 09/15/16 1343  HGB  --  11.8*  --   --  11.4*  --   --   HCT  --  34.9*  --   --  34.1*  --   --   PLT  --  184  --   --  180  --   --   LABPROT  --   --  14.5  --   --   --   --   INR  --   --  1.12  --   --   --   --   HEPARINUNFRC  --   --   --   --   --  0.40 0.31  CREATININE 1.59*  --   --   --  1.33*  --   --   TROPONINI 0.33*  --  0.43* 0.39*  --   --   --     Estimated Creatinine Clearance: 58.7 mL/min (A) (by C-G formula based on SCr of 1.33 mg/dL (H)).   Medical History: Past Medical History:  Diagnosis Date  . Acute myocardial infarction of other inferior wall, initial episode of care   . Anginal pain ([place])   . Anxiety   . Arthritis   . Cancer [facility])    bladder 2013 removed, has cystoscopy 10/2016, has returned x 2  . COPD (chronic obstructive pulmonary disease) ([place])   . Coronary artery disease    Artery bypass graft Jan 2008  . Dyspnea   . Dysrhythmia   . GERD (gastroesophageal reflux disease)   . Heart murmur   . History of hiatal hernia   . Hyperlipidemia   . Hypertension   . Hypothyroidism   . PONV (postoperative nausea and vomiting)   . Sleep apnea   . Stroke ([place])   . Tobacco user     Medications:  Prescriptions Prior to Admission  Medication Sig Dispense Refill Last Dose  . amLODipine (NORVASC) 10 MG tablet Take 10 mg by mouth daily.   09/13/2016 at Unknown time  .  aspirin 81 MG chewable tablet Chew 1 tablet (81 mg total) by mouth daily.   09/13/2016 at Unknown time  . atorvastatin (LIPITOR) 80 MG tablet Take 1 tablet (80 mg total) by mouth daily. 30 tablet 6 09/13/2016 at Unknown time  . ergocalciferol (VITAMIN D2) 50000 UNITS capsule Take 50,000 Units by mouth every Sunday.    Past Week at Unknown time  . hydrochlorothiazide (HYDRODIURIL) 25 MG tablet Take 25 mg by mouth daily.   09/13/2016 at Unknown time  . isosorbide mononitrate (IMDUR) 30 MG 24 hr tablet Take 0.5 tablets (15 mg total) by mouth daily. 45 tablet 3 09/13/2016 at Unknown time  . levothyroxine (SYNTHROID, LEVOTHROID) 25 MCG tablet Take 25 mcg by mouth daily before breakfast.   Past Month at Unknown time  . lisinopril (PRINIVIL,ZESTRIL) 40 MG tablet Take 1 tablet (40 mg total) by  mouth daily. 90 tablet 3 09/13/2016 at Unknown time  . metoprolol succinate (TOPROL-XL) 25 MG 24 hr tablet Take 25 mg by mouth daily.   09/13/2016 at Unknown time  . nitroGLYCERIN (NITROSTAT) 0.4 MG SL tablet Place 1 tablet (0.4 mg total) under the tongue every 5 (five) minutes x 3 doses as needed for chest pain. 25 tablet 3 09/13/2016 at Unknown time  . Omega-3 Fatty Acids (RA FISH OIL) 1000 MG CAPS Take 2,000 mg by mouth 2 (two) times daily.    09/13/2016 at Unknown time  . omeprazole (PRILOSEC) 20 MG capsule Take 40 mg by mouth daily.    09/13/2016 at Unknown time    Assessment: 71 y.o. M presented to [facility] for CP. Received Lovenox 95mg  SQ 5/1 ~1900. Transferred to [facility] for cardiac workup. Restarting heparin gtt per pharmacy after cath on 5/2 8 hours after sheath removal.  Confirmatory heparin level after restarting was therapeutic at 0.31 on heparin 1250 units/hr IV. No bleeding noted.  Goal of Therapy:  Heparin level 0.3-0.7 units/ml Monitor platelets by anticoagulation protocol: Yes   Plan:  Continue heparin 1250 units/hr Daily heparin level and CBC Follow up plans for CABG  Demetrius Charity, PharmD Acute Care  Pharmacy Resident  Pager: [phone] 09/15/2016

## 2016-09-16 ENCOUNTER — Inpatient Hospital Stay (HOSPITAL_COMMUNITY): Payer: Medicare Other

## 2016-09-16 ENCOUNTER — Encounter (HOSPITAL_COMMUNITY): Payer: Medicare Other

## 2016-09-16 DIAGNOSIS — I25110 Atherosclerotic heart disease of native coronary artery with unstable angina pectoris: Secondary | ICD-10-CM

## 2016-09-16 DIAGNOSIS — I214 Non-ST elevation (NSTEMI) myocardial infarction: Secondary | ICD-10-CM

## 2016-09-16 DIAGNOSIS — Z01810 Encounter for preprocedural cardiovascular examination: Secondary | ICD-10-CM

## 2016-09-16 LAB — PULMONARY FUNCTION TEST
FEF 25-75 Post: 1.66 L/sec
FEF 25-75 Pre: 2.96 L/sec
FEF2575-%CHANGE-POST: -43 %
FEF2575-%PRED-POST: 70 %
FEF2575-%Pred-Pre: 124 %
FEV1-%Change-Post: -9 %
FEV1-%PRED-POST: 74 %
FEV1-%Pred-Pre: 82 %
FEV1-PRE: 2.57 L
FEV1-Post: 2.33 L
FEV1FVC-%CHANGE-POST: 0 %
FEV1FVC-%Pred-Pre: 110 %
FEV6-%Change-Post: -8 %
FEV6-%PRED-PRE: 78 %
FEV6-%Pred-Post: 71 %
FEV6-PRE: 3.14 L
FEV6-Post: 2.86 L
FEV6FVC-%Change-Post: 0 %
FEV6FVC-%PRED-PRE: 106 %
FEV6FVC-%Pred-Post: 106 %
FVC-%CHANGE-POST: -8 %
FVC-%PRED-PRE: 73 %
FVC-%Pred-Post: 67 %
FVC-POST: 2.87 L
FVC-PRE: 3.14 L
POST FEV1/FVC RATIO: 81 %
POST FEV6/FVC RATIO: 100 %
Pre FEV1/FVC ratio: 82 %
Pre FEV6/FVC Ratio: 100 %

## 2016-09-16 LAB — CBC
HEMATOCRIT: 31 % — AB (ref 39.0–52.0)
HEMOGLOBIN: 10.7 g/dL — AB (ref 13.0–17.0)
MCH: 29.2 pg (ref 26.0–34.0)
MCHC: 34.5 g/dL (ref 30.0–36.0)
MCV: 84.7 fL (ref 78.0–100.0)
Platelets: 164 10*3/uL (ref 150–400)
RBC: 3.66 MIL/uL — AB (ref 4.22–5.81)
RDW: 14.2 % (ref 11.5–15.5)
WBC: 5.7 10*3/uL (ref 4.0–10.5)

## 2016-09-16 LAB — BLOOD GAS, ARTERIAL
Acid-Base Excess: 1.5 mmol/L (ref 0.0–2.0)
BICARBONATE: 25 mmol/L (ref 20.0–28.0)
Drawn by: 246861
O2 Saturation: 96.2 %
PATIENT TEMPERATURE: 98.6
PH ART: 7.457 — AB (ref 7.350–7.450)
PO2 ART: 81.7 mmHg — AB (ref 83.0–108.0)
pCO2 arterial: 36 mmHg (ref 32.0–48.0)

## 2016-09-16 LAB — HEPARIN LEVEL (UNFRACTIONATED): Heparin Unfractionated: 0.39 IU/mL (ref 0.30–0.70)

## 2016-09-16 LAB — ECHOCARDIOGRAM COMPLETE
Height: 69 in
WEIGHTICAEL: 3340.8 [oz_av]

## 2016-09-16 MED ORDER — LISINOPRIL 10 MG PO TABS
10.0000 mg | ORAL_TABLET | Freq: Every day | ORAL | Status: DC
  Administered 2016-09-16 – 2016-09-18 (×3): 10 mg via ORAL
  Filled 2016-09-16 (×3): qty 1

## 2016-09-16 MED ORDER — ALBUTEROL SULFATE (2.5 MG/3ML) 0.083% IN NEBU
2.5000 mg | INHALATION_SOLUTION | Freq: Once | RESPIRATORY_TRACT | Status: AC
  Administered 2016-09-16: 2.5 mg via RESPIRATORY_TRACT

## 2016-09-16 NOTE — Progress Notes (Signed)
RN received call from central tele that pt briefly showed ventricular bigeminy on the monitor. Pt was symptomatic with CP 5/10 and diaphoresis which woke him from his sleep.  Nitro x 3 given;  CP resolved.  EKG obtained.

## 2016-09-16 NOTE — Progress Notes (Signed)
2 Days Post-Op Procedure(s) (LRB): Left Heart Cath and Cors/Grafts Angiography (N/A) Subjective:  Had some chest pain overnight while sleeping relieved with SL NTG. Most of his episodes at home have been with sleeping and he has sleep apnea but does not wear his CPAP.  Objective: Vital signs in last 24 hours: Temp:  [97.5 F (36.4 C)-98 F (36.7 C)] 97.5 F (36.4 C) (05/04 1210) Pulse Rate:  [52-76] 57 (05/04 1210) Cardiac Rhythm: Sinus bradycardia (05/04 1312) Resp:  [16-21] 20 (05/04 1210) BP: (127-165)/(57-66) 127/57 (05/04 1312) SpO2:  [93 %-100 %] 100 % (05/04 1210) Weight:  [94.8 kg (209 lb)] 94.8 kg (209 lb) (05/04 0500)  Hemodynamic parameters for last 24 hours:    Intake/Output from previous day: 05/03 0701 - 05/04 0700 In: 473 [P.O.:320; I.V.:153] Out: 500 [Urine:500] Intake/Output this shift: Total I/O In: 185.5 [P.O.:120; I.V.:65.5] Out: 200 [Urine:200]  General appearance: alert and cooperative Heart: regular rate and rhythm, S1, S2 normal, no murmur, click, rub or gallop Lungs: clear to auscultation bilaterally  Lab Results:  Recent Labs  09/15/16 0416 09/16/16 0326  WBC 6.6 5.7  HGB 11.4* 10.7*  HCT 34.1* 31.0*  PLT 180 164   BMET:  Recent Labs  09/14/16 0338 09/15/16 0416  NA 137 136  K 3.8 3.6  CL 105 104  CO2 24 23  GLUCOSE 102* 103*  BUN 28* 22*  CREATININE 1.59* 1.33*  CALCIUM 8.6* 8.2*    PT/INR:  Recent Labs  09/14/16 0927  LABPROT 14.5  INR 1.12   ABG No results found for: PHART, HCO3, TCO2, ACIDBASEDEF, O2SAT CBG (last 3)  No results for input(s): GLUCAP in the last 72 hours.  Assessment/Plan:  Severe native 3-vessel CAD and saphenous vein graft disease with unstable angina.  His upper extremity dopplers shows a normal left palmar arch so his left radial artery can be used. The right radial is abnormal and can not be used. I would plan to use the left radial and right IMA. If needed I would try to find a piece of vein  in the legs although the left saphenous was small at the knee and the right was used from the groin to lower calf. PFT's are pending but I don't think they would prohibit CABG in this pt. Echo looks ok with good EF. He has mild AS with a mean gradient of 13 mm Hg but I would not replace his aortic valve at this time. This will require follow up. Plan redo CABG Monday am. I discussed the operative procedure with the patient and his wife including alternatives, benefits and risks; including but not limited to bleeding, blood transfusion, infection, stroke, myocardial infarction, graft failure, heart block requiring a permanent pacemaker, organ dysfunction, and death.  [name] understands and agrees to proceed.     LOS: 2 days    [name] 09/16/2016

## 2016-09-16 NOTE — Progress Notes (Signed)
[place] for Heparin Indication: NSTEMI  Allergies  Allergen Reactions  . Cardizem Cd [Diltiazem Hcl Er Beads] Other (See Comments)    Makes heart skip    Patient Measurements: Height: 5\' 9"  (175.3 cm) Weight: 208 lb 12.8 oz (94.7 kg) IBW/kg (Calculated) : 70.7 Heparin Dosing Weight: 92 kg  Vital Signs: Temp: 97.9 F (36.6 C) (05/04 0025) Temp Source: Oral (05/04 0025) BP: 140/59 (05/04 0025) Pulse Rate: 52 (05/04 0025)  Labs:  Recent Labs  09/14/16 0338  09/14/16 0534 09/14/16 0927 09/14/16 1419 09/15/16 0416 09/15/16 0536 09/15/16 1343 09/16/16 0326  HGB  --   < > 11.8*  --   --  11.4*  --   --  10.7*  HCT  --   --  34.9*  --   --  34.1*  --   --  31.0*  PLT  --   --  184  --   --  180  --   --  164  LABPROT  --   --   --  14.5  --   --   --   --   --   INR  --   --   --  1.12  --   --   --   --   --   HEPARINUNFRC  --   --   --   --   --   --  0.40 0.31 0.39  CREATININE 1.59*  --   --   --   --  1.33*  --   --   --   TROPONINI 0.33*  --   --  0.43* 0.39*  --   --   --   --   < > = values in this interval not displayed.  Estimated Creatinine Clearance: 58.7 mL/min (A) (by C-G formula based on SCr of 1.33 mg/dL (H)).   Medical History: Past Medical History:  Diagnosis Date  . Acute myocardial infarction of other inferior wall, initial episode of care   . Anginal pain ([place])   . Anxiety   . Arthritis   . Cancer [facility])    bladder 2013 removed, has cystoscopy 10/2016, has returned x 2  . COPD (chronic obstructive pulmonary disease) ([place])   . Coronary artery disease    Artery bypass graft Jan 2008  . Dyspnea   . Dysrhythmia   . GERD (gastroesophageal reflux disease)   . Heart murmur   . History of hiatal hernia   . Hyperlipidemia   . Hypertension   . Hypothyroidism   . PONV (postoperative nausea and vomiting)   . Sleep apnea   . Stroke ([place])   . Tobacco user     Medications:  Prescriptions Prior to Admission   Medication Sig Dispense Refill Last Dose  . amLODipine (NORVASC) 10 MG tablet Take 10 mg by mouth daily.   09/13/2016 at Unknown time  . aspirin 81 MG chewable tablet Chew 1 tablet (81 mg total) by mouth daily.   09/13/2016 at Unknown time  . atorvastatin (LIPITOR) 80 MG tablet Take 1 tablet (80 mg total) by mouth daily. 30 tablet 6 09/13/2016 at Unknown time  . ergocalciferol (VITAMIN D2) 50000 UNITS capsule Take 50,000 Units by mouth every Sunday.    Past Week at Unknown time  . hydrochlorothiazide (HYDRODIURIL) 25 MG tablet Take 25 mg by mouth daily.   09/13/2016 at Unknown time  . isosorbide mononitrate (IMDUR) 30 MG 24 hr tablet Take 0.5  tablets (15 mg total) by mouth daily. 45 tablet 3 09/13/2016 at Unknown time  . levothyroxine (SYNTHROID, LEVOTHROID) 25 MCG tablet Take 25 mcg by mouth daily before breakfast.   Past Month at Unknown time  . lisinopril (PRINIVIL,ZESTRIL) 40 MG tablet Take 1 tablet (40 mg total) by mouth daily. 90 tablet 3 09/13/2016 at Unknown time  . metoprolol succinate (TOPROL-XL) 25 MG 24 hr tablet Take 25 mg by mouth daily.   09/13/2016 at Unknown time  . nitroGLYCERIN (NITROSTAT) 0.4 MG SL tablet Place 1 tablet (0.4 mg total) under the tongue every 5 (five) minutes x 3 doses as needed for chest pain. 25 tablet 3 09/13/2016 at Unknown time  . Omega-3 Fatty Acids (RA FISH OIL) 1000 MG CAPS Take 2,000 mg by mouth 2 (two) times daily.    09/13/2016 at Unknown time  . omeprazole (PRILOSEC) 20 MG capsule Take 40 mg by mouth daily.    09/13/2016 at Unknown time    Assessment: 71 y.o. M presented to [facility] for CP. Received Lovenox 95mg  SQ 5/1 ~1900. Transferred to [facility] for cardiac workup. Restarting heparin gtt per pharmacy after cath on 5/2 8 hours after sheath removal.  Daily heparin level after restarting was therapeutic at 0.39 on heparin 1250 units/hr IV. Hemoglobin down a little to 10.7 and platelet count within normal limits. No bleeding noted.  Goal of Therapy:  Heparin  level 0.3-0.7 units/ml Monitor platelets by anticoagulation protocol: Yes   Plan:  Continue heparin 1250 units/hr Daily heparin level and CBC Follow up plans for CABG  Demetrius Charity, PharmD Acute Care Pharmacy Resident  Pager: [phone] 09/16/2016

## 2016-09-16 NOTE — Progress Notes (Addendum)
Progress Note  Patient Name: [name] Date of Encounter: 09/16/2016  Primary Cardiologist: Harl Bowie  Subjective   One episode of chest pain overnight. No chest pain this am.   Inpatient Medications    Scheduled Meds: . amLODipine  10 mg Oral Daily  . aspirin EC  81 mg Oral Daily  . atorvastatin  80 mg Oral q1800  . hydrochlorothiazide  25 mg Oral Daily  . isosorbide mononitrate  15 mg Oral Once  . isosorbide mononitrate  30 mg Oral Daily  . levothyroxine  25 mcg Oral QAC breakfast  . omega-3 acid ethyl esters  1 g Oral BID  . pantoprazole  40 mg Oral Daily  . sodium chloride flush  3 mL Intravenous Q12H   Continuous Infusions: . sodium chloride    . heparin 1,250 Units/hr (09/16/16 0907)   PRN Meds: sodium chloride, acetaminophen, diazepam, nitroGLYCERIN, ondansetron (ZOFRAN) IV, sodium chloride flush   Vital Signs    Vitals:   09/15/16 2032 09/16/16 0025 09/16/16 0500 09/16/16 0722  BP: 140/65 (!) 140/59 (!) 159/66 (!) 165/65  Pulse: 60 (!) 52 76 60  Resp:   (!) 21 16  Temp: 98 F (36.7 C) 97.9 F (36.6 C) 97.9 F (36.6 C) 97.8 F (36.6 C)  TempSrc: Oral Oral Oral Oral  SpO2: 98% 93% 99% 97%  Weight:   209 lb (94.8 kg)   Height:        Intake/Output Summary (Last 24 hours) at 09/16/16 0930 Last data filed at 09/16/16 0723  Gross per 24 hour  Intake            460.5 ml  Output              700 ml  Net           -239.5 ml   Filed Weights   09/14/16 0204 09/15/16 0554 09/16/16 0500  Weight: 204 lb 3.2 oz (92.6 kg) 208 lb 12.8 oz (94.7 kg) 209 lb (94.8 kg)    Telemetry    sinus - Personally Reviewed  ECG      Physical Exam    General: Well developed, well nourished, NAD  HEENT: OP clear, mucus membranes moist  SKIN: warm, dry. No rashes. Neuro: No focal deficits  Musculoskeletal: Muscle strength 5/5 all ext  Psychiatric: Mood and affect normal  Neck: No JVD, no carotid bruits, no thyromegaly, no lymphadenopathy.  Lungs:Clear  bilaterally, no wheezes, rhonci, crackles Cardiovascular: Regular rate and rhythm. Soft systolic murmur. No gallops or rubs. Abdomen:Soft. Bowel sounds present. Non-tender.  Extremities: No lower extremity edema. Pulses are 2 + in the bilateral DP/PT.    Labs    Chemistry  Recent Labs Lab 09/14/16 0338 09/15/16 0416  NA 137 136  K 3.8 3.6  CL 105 104  CO2 24 23  GLUCOSE 102* 103*  BUN 28* 22*  CREATININE 1.59* 1.33*  CALCIUM 8.6* 8.2*  GFRNONAA 42* 53*  GFRAA 49* >60  ANIONGAP 8 9     Hematology  Recent Labs Lab 09/14/16 0534 09/15/16 0416 09/16/16 0326  WBC 6.4 6.6 5.7  RBC 4.17* 4.05* 3.66*  HGB 11.8* 11.4* 10.7*  HCT 34.9* 34.1* 31.0*  MCV 83.7 84.2 84.7  MCH 28.3 28.1 29.2  MCHC 33.8 33.4 34.5  RDW 14.0 13.9 14.2  PLT 184 180 164    Cardiac Enzymes  Recent Labs Lab 09/14/16 0338 09/14/16 0927 09/14/16 1419  TROPONINI 0.33* 0.43* 0.39*   No results for input(s): TROPIPOC  in the last 168 hours.   BNPNo results for input(s): BNP, PROBNP in the last 168 hours.   DDimer No results for input(s): DDIMER in the last 168 hours.    Radiology    Dg Chest 2 View  Result Date: 09/15/2016 CLINICAL DATA:  CP, slight SOB x1 year. Hx of cardiac catheterization(09/14/16), bypass(2008), stent placement(2015), hypertension, myocardial infarction, anginal pain, COPD, CAD, dyspnea, dysrhythmia, sleep apnea, stroke. Smoker x50+ years. EXAM: CHEST  2 VIEW COMPARISON:  09/13/2016 FINDINGS: There are changes from prior CABG surgery. The cardiac silhouette is normal in size and configuration. No mediastinal or hilar masses. No evidence of adenopathy. The lungs are clear.  No pleural effusion.  No pneumothorax. Skeletal structures are intact. IMPRESSION: No acute cardiopulmonary disease. Electronically Signed   By: Lajean Manes M.D.   On: 09/15/2016 11:39    Cardiac Studies   LHC: 09/14/16  Conclusion     The left ventricular ejection fraction is 45-50% by visual  estimate.  LV end diastolic pressure is normal.  There is mild left ventricular systolic dysfunction.  Dist RCA lesion, 90 %stenosed.  SVG.  Dist Graft-1 lesion, 95 %stenosed.  Dist Graft-2 lesion, 50 %stenosed.  Prox Graft lesion, 80 %stenosed.  Ost 2nd Mrg lesion, 100 %stenosed.  SVG.  Mid Graft lesion, 100 %stenosed.  Prox Graft lesion, 100 %stenosed.  Seq SVG-.  Origin lesion, 99 %stenosed.  Prox LAD lesion, 100 %stenosed.  Prox Graft to Dist Graft lesion, 25 %stenosed.  Ost LAD to Prox LAD lesion, 80 %stenosed.   Multivessel native CAD with diffuse 80% proximal LAD stenosis and occlusion of the LAD after a small first diagonal vessel; occluded marginal branch arising from the mid AV groove circumflex with an otherwise patent AV groove circumflex extending distally; RCA with 20% mid narrowing normal PDA, but with 90% PLA stenosis.  Patent LIMA graft supplying the mid LAD.  SVG sequentially supplying the diagonal-1 and OM vessel with 99% near ostial graft stenosis and diffuse luminal irregularity in the body of the graft prior to the initial anastomosis.  SVG supplying the diagonal 2 vessel with diffuse proximal to mid graft stenosis, 95% ulcerated plaque stenosis in the distal third of the graft and 50% eccentric stenosis prior to its anastomosis.  SVG which had previously supplied the PLA and had been stented, is occluded proximally.  Mild-to-moderate LV dysfunction with mid distal anterolateral hypocontractility and an ejection fraction of 45-50%.  RECOMMENDATION: The patient had previously undergone CABG surgery by Dr. Cyndia Bent in 2008.  Dr. Cyndia Bent will be re-consulted for evaluation and consideration of possible redo CABG revascularization surgery.    Patient Profile     71 y.o. male with PMH of CAD s/p 4v CABG in 2008, with DES placed to [place] in 2015, PAD, TIA, HTN, Bladder Ca and OSA who was transferred from [facility] for NSTEMI. Underwent LHC  09/14/16 showing severe disease in vein grafts as above. CT surgery consult for redo CABG  Assessment & Plan    1. CAD/Unstable Angina: Pt admitted with NSTEMI. Cath 09/14/16 with severe stenosis in the SVG to OM, SVG to Diagonal and chronic occlusion of the SVG to RCA. LIMA to LAD is patent. Dr. Claiborne Billings has consulted Dr. Cyndia Bent for redo CABG. Planning for CABG pending adequate graft conduit. He had one episode of chest pain overnight. Echo with preserved LV systolic function -Will continue IV heparin while awaiting final plan for revascularization. Will continue high dose statin, ASA.  -No beta blocker with bradycardia.  2. HTN: BP is elevated. Resume Lisinopril. Increase Imdur.   3. HLD: continue statin.    Signed, Lauree Chandler, MD  09/16/2016, 9:30 AM

## 2016-09-16 NOTE — Progress Notes (Signed)
Pre-op Cardiac Surgery  Carotid Findings:  Mild to moderate plaque in bilateral internal carotid artery with a 1-39% right ICA stenosis and 40-59% left ICA stenosis. Vertebral arteries demonstrated antegrade flow bilaterally.  Right subclavian demonstrated elevated velocities suggestive greater than 50% stenosis.   Upper Extremity Right Left  Brachial Pressures 149 145  Radial Waveforms Triphasic Triphasic  Ulnar Waveforms Triphasic Triphasic  Palmar Arch (Allen's Test) Abnormal WNL   Findings:  Right radial artery decreased greater than 50% with compression Left Doppler waveforms remain within normal limits with both radial and ulnar compression.    Lower  Extremity Right Left  Dorsalis Pedis 148 77  Posterior Tibial 122 93  Ankle/Brachial Indices 0.99 0.62    Findings:  Right ABI appeared normal at rest. . Left ABI moderate arterial disease at rest.  Oda Cogan, BS, RDMS, RVT

## 2016-09-17 LAB — CBC
HEMATOCRIT: 32 % — AB (ref 39.0–52.0)
Hemoglobin: 10.8 g/dL — ABNORMAL LOW (ref 13.0–17.0)
MCH: 28.4 pg (ref 26.0–34.0)
MCHC: 33.8 g/dL (ref 30.0–36.0)
MCV: 84.2 fL (ref 78.0–100.0)
PLATELETS: 181 10*3/uL (ref 150–400)
RBC: 3.8 MIL/uL — ABNORMAL LOW (ref 4.22–5.81)
RDW: 14 % (ref 11.5–15.5)
WBC: 7.1 10*3/uL (ref 4.0–10.5)

## 2016-09-17 LAB — HEPARIN LEVEL (UNFRACTIONATED)
HEPARIN UNFRACTIONATED: 0.35 [IU]/mL (ref 0.30–0.70)
Heparin Unfractionated: <0.1 IU/mL — ABNORMAL LOW (ref 0.30–0.70)
Heparin Unfractionated: 0.21 IU/mL — ABNORMAL LOW (ref 0.30–0.70)

## 2016-09-17 MED ORDER — HEPARIN BOLUS VIA INFUSION
1400.0000 [IU] | Freq: Once | INTRAVENOUS | Status: AC
  Administered 2016-09-17: 1400 [IU] via INTRAVENOUS
  Filled 2016-09-17: qty 1400

## 2016-09-17 MED ORDER — ATORVASTATIN CALCIUM 80 MG PO TABS
80.0000 mg | ORAL_TABLET | Freq: Every day | ORAL | Status: DC
  Administered 2016-09-17 – 2016-09-18 (×2): 80 mg via ORAL
  Filled 2016-09-17 (×2): qty 1

## 2016-09-17 NOTE — Progress Notes (Signed)
Progress Note  Patient Name: [name] Date of Encounter: 09/17/2016  Primary Cardiologist: dr. Harl Bowie  Subjective   Feeling well.  No chest pain or shortness of breath.    Inpatient Medications    Scheduled Meds: . amLODipine  10 mg Oral Daily  . aspirin EC  81 mg Oral Daily  . atorvastatin  80 mg Oral QHS  . heparin  1,400 Units Intravenous Once  . hydrochlorothiazide  25 mg Oral Daily  . isosorbide mononitrate  30 mg Oral Daily  . levothyroxine  25 mcg Oral QAC breakfast  . lisinopril  10 mg Oral Daily  . omega-3 acid ethyl esters  1 g Oral BID  . pantoprazole  40 mg Oral Daily  . sodium chloride flush  3 mL Intravenous Q12H   Continuous Infusions: . sodium chloride    . heparin 1,250 Units/hr (09/17/16 0358)   PRN Meds: sodium chloride, acetaminophen, diazepam, nitroGLYCERIN, ondansetron (ZOFRAN) IV, sodium chloride flush   Vital Signs    Vitals:   09/16/16 1957 09/16/16 2340 09/17/16 0510 09/17/16 0742  BP: (!) 135/57 (!) 147/67 (!) 150/64 (!) 141/41  Pulse: 62 68 79 (!) 59  Resp: (!) 21 17 14 18   Temp: 97.6 F (36.4 C) 97.7 F (36.5 C) 97.7 F (36.5 C) 98.1 F (36.7 C)  TempSrc: Oral Axillary Oral Oral  SpO2: 97% 96% 98% 100%  Weight:   95.3 kg (210 lb)   Height:        Intake/Output Summary (Last 24 hours) at 09/17/16 1155 Last data filed at 09/17/16 0800  Gross per 24 hour  Intake           1212.5 ml  Output              800 ml  Net            412.5 ml   Filed Weights   09/15/16 0554 09/16/16 0500 09/17/16 0510  Weight: 94.7 kg (208 lb 12.8 oz) 94.8 kg (209 lb) 95.3 kg (210 lb)    Telemetry    No events - Personally Reviewed  ECG    n/a - Personally Reviewed  Physical Exam   GEN: No acute distress.   Neck: No JVD Cardiac: RRR, II/VI systolic murmur at LUSB, no rubs, or gallops.  Respiratory: Clear to auscultation bilaterally. GI: Soft, nontender, non-distended  MS: No edema; No deformity. Neuro:  Nonfocal  Psych: Normal  affect   Labs    Chemistry Recent Labs Lab 09/14/16 0338 09/15/16 0416  NA 137 136  K 3.8 3.6  CL 105 104  CO2 24 23  GLUCOSE 102* 103*  BUN 28* 22*  CREATININE 1.59* 1.33*  CALCIUM 8.6* 8.2*  GFRNONAA 42* 53*  GFRAA 49* >60  ANIONGAP 8 9     Hematology Recent Labs Lab 09/15/16 0416 09/16/16 0326 09/17/16 0338  WBC 6.6 5.7 7.1  RBC 4.05* 3.66* 3.80*  HGB 11.4* 10.7* 10.8*  HCT 34.1* 31.0* 32.0*  MCV 84.2 84.7 84.2  MCH 28.1 29.2 28.4  MCHC 33.4 34.5 33.8  RDW 13.9 14.2 14.0  PLT 180 164 181    Cardiac Enzymes Recent Labs Lab 09/14/16 0338 09/14/16 0927 09/14/16 1419  TROPONINI 0.33* 0.43* 0.39*   No results for input(s): TROPIPOC in the last 168 hours.   BNPNo results for input(s): BNP, PROBNP in the last 168 hours.   DDimer No results for input(s): DDIMER in the last 168 hours.   Radiology  No results found.  Cardiac Studies   LHC: 09/14/16  Conclusion     The left ventricular ejection fraction is 45-50% by visual estimate.  LV end diastolic pressure is normal.  There is mild left ventricular systolic dysfunction.  Dist RCA lesion, 90 %stenosed.  SVG.  Dist Graft-1 lesion, 95 %stenosed.  Dist Graft-2 lesion, 50 %stenosed.  Prox Graft lesion, 80 %stenosed.  Ost 2nd Mrg lesion, 100 %stenosed.  SVG.  Mid Graft lesion, 100 %stenosed.  Prox Graft lesion, 100 %stenosed.  Seq SVG-.  Origin lesion, 99 %stenosed.  Prox LAD lesion, 100 %stenosed.  Prox Graft to Dist Graft lesion, 25 %stenosed.  Ost LAD to Prox LAD lesion, 80 %stenosed.  Multivessel native CAD with diffuse 80% proximal LAD stenosis and occlusion of the LAD after a small first diagonal vessel; occluded marginal branch arising from the mid AV groove circumflex with an otherwise patent AV groove circumflex extending distally; RCA with 20% mid narrowing normal PDA, but with 90% PLA stenosis.  Patent LIMA graft supplying the mid LAD.  SVG sequentially  supplying the diagonal-1 and OM vessel with 99% near ostial graft stenosis and diffuse luminal irregularity in the body of the graft prior to the initial anastomosis.  SVG supplying the diagonal 2 vessel with diffuse proximal to mid graft stenosis, 95% ulcerated plaque stenosis in the distal third of the graft and 50% eccentric stenosis prior to its anastomosis.  SVG which had previously supplied the PLA and had been stented, is occluded proximally.  Mild-to-moderate LV dysfunction with mid distal anterolateral hypocontractility and an ejection fraction of 45-50%.  RECOMMENDATION: The patient had previously undergone CABG surgery by Dr. Cyndia Bent in 2008. Dr. Cyndia Bent will be re-consulted for evaluation and consideration of possible redo CABG revascularization surgery.     Patient Profile     71 y.o. male with CAD status post 4 vessel CABG in 2008, DES in the SVG to RCA in 2015, PAD, hypertension, hyperlipidemia, and OSA here with non-ST elevation myocardial infarction. Left heart cath 09/2016 revealed stenosis of vein grafts as above.  Assessment & Plan    # NSTEMI: # CAD s/p 4v CABG with occlusion of SVG: Redo CABG planned for Monday.  Continue aspirin, amlodipine and Imdur.  Continue heparin.    # Mild aortic stenosis: Plan to medically manage.   # Hypertension:  Continue HCTZ, amlodipine, lisinopril and Imdur.  # Hyperlipidemia: Continue atorvastatin and Lovaza.     Signed, Skeet Latch, MD  09/17/2016, 11:55 AM

## 2016-09-17 NOTE — Progress Notes (Signed)
ANTICOAGULATION CONSULT NOTE  Pharmacy Consult for Heparin Indication: NSTEMI  Patient Measurements: Height: 5\' 9"  (175.3 cm) Weight: 210 lb (95.3 kg) IBW/kg (Calculated) : 70.7 Heparin Dosing Weight: 92 kg  Vital Signs: Temp: 98.1 F (36.7 C) (05/05 0742) Temp Source: Oral (05/05 0742) BP: 141/41 (05/05 0742) Pulse Rate: 59 (05/05 0742)  Labs:  Recent Labs  09/14/16 1419  09/15/16 0416  09/16/16 0326 09/17/16 0338 09/17/16 0743  HGB  --   < > 11.4*  --  10.7* 10.8*  --   HCT  --   --  34.1*  --  31.0* 32.0*  --   PLT  --   --  180  --  164 181  --   HEPARINUNFRC  --   --   --   < > 0.39 <0.10* 0.21*  CREATININE  --   --  1.33*  --   --   --   --   TROPONINI 0.39*  --   --   --   --   --   --   < > = values in this interval not displayed.  Estimated Creatinine Clearance: 58.8 mL/min (A) (by C-G formula based on SCr of 1.33 mg/dL (H)).  Assessment: 71 y.o. M s/p cath to continue on IV heparin in anticipation of CABG on Monday 5/7. Heparin level subtherapeutic today at 0.21 on 1250 units/hr. Hgb 10.8 stable, Plt wnl, no bleeding noted  Goal of Therapy:  Heparin level 0.3-0.7 units/ml Monitor platelets by anticoagulation protocol: Yes   Plan:  Bolus 1400 units Increase heparin gtt to 1400 units/hr F/u heparin level in 6 hours Daily heparin level, CBC   Gwenlyn Perking, PharmD PGY1 Pharmacy Resident 09/17/2016 10:51 AM

## 2016-09-17 NOTE — Progress Notes (Signed)
ANTICOAGULATION CONSULT NOTE  Pharmacy Consult for Heparin Indication: NSTEMI  Patient Measurements: Height: 5\' 9"  (175.3 cm) Weight: 210 lb (95.3 kg) IBW/kg (Calculated) : 70.7 Heparin Dosing Weight: 92 kg  Vital Signs: Temp: 98.2 F (36.8 C) (05/05 1155) Temp Source: Oral (05/05 1155) BP: 138/56 (05/05 1155) Pulse Rate: 63 (05/05 1155)  Labs:  Recent Labs  09/15/16 0416  09/16/16 0326 09/17/16 0338 09/17/16 0743 09/17/16 1817  HGB 11.4*  --  10.7* 10.8*  --   --   HCT 34.1*  --  31.0* 32.0*  --   --   PLT 180  --  164 181  --   --   HEPARINUNFRC  --   < > 0.39 <0.10* 0.21* 0.35  CREATININE 1.33*  --   --   --   --   --   < > = values in this interval not displayed.  Estimated Creatinine Clearance: 58.8 mL/min (A) (by C-G formula based on SCr of 1.33 mg/dL (H)).  Assessment: 71 y.o. M s/p cath to continue on IV heparin in anticipation of CABG on Monday 5/7. Heparin level therapeutic  Goal of Therapy:  Heparin level 0.3-0.7 units/ml Monitor platelets by anticoagulation protocol: Yes   Plan:  Continue heparin gtt 1400 units/hr Daily heparin level, CBC  [name], PharmD, BCPS, BCCCP Clinical Pharmacist Clinical phone for 09/17/2016 from 7a-3:30p: [phone] If after 3:30p, please call main pharmacy at: [phone] 09/17/2016 7:05 PM

## 2016-09-17 NOTE — Progress Notes (Signed)
CARDIAC REHAB PHASE I   PRE:  Rate/Rhythm: 55  BP:  Sitting: 157/59     SaO2: 99ra  MODE:  Ambulation: 400 ft   POST:  Rate/Rhythm: 64  BP:  Sitting: 149/53     SaO2: 99ra  11:59am-12:35pm Patient ambulated independently with no problems. No complaints. No breaks. Completed pre-op teaching. Put on video for he and grandson.   [place], MS 09/17/2016 12:29 PM

## 2016-09-18 LAB — PREPARE RBC (CROSSMATCH)

## 2016-09-18 LAB — BASIC METABOLIC PANEL
ANION GAP: 10 (ref 5–15)
BUN: 19 mg/dL (ref 6–20)
CALCIUM: 8.4 mg/dL — AB (ref 8.9–10.3)
CO2: 25 mmol/L (ref 22–32)
Chloride: 102 mmol/L (ref 101–111)
Creatinine, Ser: 1.37 mg/dL — ABNORMAL HIGH (ref 0.61–1.24)
GFR, EST AFRICAN AMERICAN: 59 mL/min — AB (ref >60)
GFR, EST NON AFRICAN AMERICAN: 51 mL/min — AB (ref >60)
GLUCOSE: 106 mg/dL — AB (ref 65–99)
Potassium: 3.3 mmol/L — ABNORMAL LOW (ref 3.5–5.1)
Sodium: 137 mmol/L (ref 135–145)

## 2016-09-18 LAB — URINALYSIS, ROUTINE W REFLEX MICROSCOPIC
Bacteria, UA: NONE SEEN
Bilirubin Urine: NEGATIVE
Glucose, UA: NEGATIVE mg/dL
HGB URINE DIPSTICK: NEGATIVE
Ketones, ur: NEGATIVE mg/dL
Leukocytes, UA: NEGATIVE
NITRITE: NEGATIVE
Protein, ur: >=300 mg/dL — AB
Specific Gravity, Urine: 1.008 (ref 1.005–1.030)
Squamous Epithelial / LPF: NONE SEEN
pH: 5 (ref 5.0–8.0)

## 2016-09-18 LAB — CBC
HCT: 32.3 % — ABNORMAL LOW (ref 39.0–52.0)
Hemoglobin: 10.8 g/dL — ABNORMAL LOW (ref 13.0–17.0)
MCH: 28.2 pg (ref 26.0–34.0)
MCHC: 33.4 g/dL (ref 30.0–36.0)
MCV: 84.3 fL (ref 78.0–100.0)
Platelets: 160 K/uL (ref 150–400)
RBC: 3.83 MIL/uL — ABNORMAL LOW (ref 4.22–5.81)
RDW: 13.8 % (ref 11.5–15.5)
WBC: 5.2 K/uL (ref 4.0–10.5)

## 2016-09-18 LAB — APTT: APTT: 63 s — AB (ref 24–36)

## 2016-09-18 LAB — HEPARIN LEVEL (UNFRACTIONATED): Heparin Unfractionated: 0.54 [IU]/mL (ref 0.30–0.70)

## 2016-09-18 MED ORDER — DOPAMINE-DEXTROSE 3.2-5 MG/ML-% IV SOLN
0.0000 ug/kg/min | INTRAVENOUS | Status: AC
  Administered 2016-09-19: 2 ug/kg/min via INTRAVENOUS
  Filled 2016-09-18 (×2): qty 250

## 2016-09-18 MED ORDER — BISACODYL 5 MG PO TBEC
5.0000 mg | DELAYED_RELEASE_TABLET | Freq: Once | ORAL | Status: AC
  Administered 2016-09-18: 5 mg via ORAL
  Filled 2016-09-18: qty 1

## 2016-09-18 MED ORDER — VANCOMYCIN HCL 10 G IV SOLR
1500.0000 mg | INTRAVENOUS | Status: AC
  Administered 2016-09-19: 1500 mg via INTRAVENOUS
  Filled 2016-09-18 (×2): qty 1500

## 2016-09-18 MED ORDER — CHLORHEXIDINE GLUCONATE 0.12 % MT SOLN
15.0000 mL | Freq: Once | OROMUCOSAL | Status: AC
  Administered 2016-09-19: 15 mL via OROMUCOSAL
  Filled 2016-09-18: qty 15

## 2016-09-18 MED ORDER — NITROGLYCERIN IN D5W 200-5 MCG/ML-% IV SOLN
2.0000 ug/min | INTRAVENOUS | Status: AC
  Administered 2016-09-19: 5 ug/min via INTRAVENOUS
  Filled 2016-09-18: qty 250

## 2016-09-18 MED ORDER — DEXMEDETOMIDINE HCL IN NACL 400 MCG/100ML IV SOLN
0.1000 ug/kg/h | INTRAVENOUS | Status: AC
  Administered 2016-09-19: .3 ug/kg/h via INTRAVENOUS
  Filled 2016-09-18 (×2): qty 100

## 2016-09-18 MED ORDER — PLASMA-LYTE 148 IV SOLN
INTRAVENOUS | Status: AC
  Administered 2016-09-19: 500 mL
  Filled 2016-09-18 (×2): qty 2.5

## 2016-09-18 MED ORDER — SODIUM CHLORIDE 0.9 % IV SOLN
INTRAVENOUS | Status: DC
  Filled 2016-09-18 (×2): qty 30

## 2016-09-18 MED ORDER — DIAZEPAM 5 MG PO TABS
5.0000 mg | ORAL_TABLET | Freq: Once | ORAL | Status: AC
  Administered 2016-09-19: 5 mg via ORAL
  Filled 2016-09-18: qty 1

## 2016-09-18 MED ORDER — EPINEPHRINE PF 1 MG/ML IJ SOLN
0.0000 ug/min | INTRAMUSCULAR | Status: DC
Start: 2016-09-19 — End: 2016-09-19
  Filled 2016-09-18 (×2): qty 4

## 2016-09-18 MED ORDER — SODIUM CHLORIDE 0.9 % IV SOLN
INTRAVENOUS | Status: AC
  Administered 2016-09-19: .7 [IU]/h via INTRAVENOUS
  Filled 2016-09-18 (×2): qty 2.5

## 2016-09-18 MED ORDER — MAGNESIUM SULFATE 50 % IJ SOLN
40.0000 meq | INTRAMUSCULAR | Status: DC
Start: 2016-09-19 — End: 2016-09-19
  Filled 2016-09-18 (×2): qty 10

## 2016-09-18 MED ORDER — TRANEXAMIC ACID (OHS) BOLUS VIA INFUSION
15.0000 mg/kg | INTRAVENOUS | Status: AC
  Administered 2016-09-19: 1429.5 mg via INTRAVENOUS
  Filled 2016-09-18: qty 1430

## 2016-09-18 MED ORDER — TEMAZEPAM 15 MG PO CAPS: 15.0000 mg | ORAL_CAPSULE | Freq: Once | ORAL | Status: DC | PRN

## 2016-09-18 MED ORDER — SODIUM CHLORIDE 0.9 % IV SOLN
30.0000 ug/min | INTRAVENOUS | Status: AC
  Administered 2016-09-19: 40 ug/min via INTRAVENOUS
  Filled 2016-09-18 (×2): qty 2

## 2016-09-18 MED ORDER — POTASSIUM CHLORIDE 2 MEQ/ML IV SOLN
80.0000 meq | INTRAVENOUS | Status: DC
  Filled 2016-09-18 (×2): qty 40

## 2016-09-18 MED ORDER — METOPROLOL TARTRATE 12.5 MG HALF TABLET
12.5000 mg | ORAL_TABLET | Freq: Once | ORAL | Status: AC
  Administered 2016-09-19: 12.5 mg via ORAL
  Filled 2016-09-18: qty 1

## 2016-09-18 MED ORDER — TRANEXAMIC ACID (OHS) PUMP PRIME SOLUTION
2.0000 mg/kg | INTRAVENOUS | Status: DC
  Filled 2016-09-18 (×2): qty 1.91

## 2016-09-18 MED ORDER — TRANEXAMIC ACID 1000 MG/10ML IV SOLN
1.5000 mg/kg/h | INTRAVENOUS | Status: AC
  Administered 2016-09-19: 1.5 mg/kg/h via INTRAVENOUS
  Filled 2016-09-18 (×2): qty 25

## 2016-09-18 MED ORDER — CEFUROXIME SODIUM 1.5 G IJ SOLR
1.5000 g | INTRAMUSCULAR | Status: AC
  Administered 2016-09-19: .75 g via INTRAVENOUS
  Administered 2016-09-19: 1.5 g via INTRAVENOUS
  Filled 2016-09-18 (×2): qty 1.5

## 2016-09-18 MED ORDER — DEXTROSE 5 % IV SOLN
750.0000 mg | INTRAVENOUS | Status: DC
  Filled 2016-09-18 (×2): qty 750

## 2016-09-18 MED ORDER — CHLORHEXIDINE GLUCONATE CLOTH 2 % EX PADS: 6.0000 | MEDICATED_PAD | Freq: Once | CUTANEOUS | Status: DC

## 2016-09-18 MED ORDER — CHLORHEXIDINE GLUCONATE CLOTH 2 % EX PADS
6.0000 | MEDICATED_PAD | Freq: Once | CUTANEOUS | Status: AC
  Administered 2016-09-18: 6 via TOPICAL

## 2016-09-18 NOTE — Progress Notes (Signed)
ANTICOAGULATION CONSULT NOTE  Pharmacy Consult for Heparin Indication: NSTEMI  Patient Measurements: Height: 5\' 9"  (175.3 cm) Weight: 210 lb (95.3 kg) IBW/kg (Calculated) : 70.7 Heparin Dosing Weight: 92 kg  Vital Signs: Temp: 97.9 F (36.6 C) (05/06 0544) Temp Source: Oral (05/06 0544) BP: 159/74 (05/06 0720) Pulse Rate: 62 (05/06 0720)  Labs:  Recent Labs  09/16/16 0326 09/17/16 0338 09/17/16 0743 09/17/16 1817 09/18/16 0203  HGB 10.7* 10.8*  --   --  10.8*  HCT 31.0* 32.0*  --   --  32.3*  PLT 164 181  --   --  160  HEPARINUNFRC 0.39 <0.10* 0.21* 0.35 0.54  CREATININE  --   --   --   --  1.37*    Estimated Creatinine Clearance: 57.1 mL/min (A) (by C-G formula based on SCr of 1.37 mg/dL (H)).  Assessment: 71 y.o. M s/p cath to continue on IV heparin in anticipation of CABG on Monday 5/7. Heparin level remains therapeutic today at 0.54 on 1400 units/hr. Hgb 10.8 stable, Plt wnl, no bleeding noted  Goal of Therapy:  Heparin level 0.3-0.7 units/ml Monitor platelets by anticoagulation protocol: Yes   Plan:  Continue heparin gtt at 1400 units/hr Daily heparin level, CBC   Gwenlyn Perking, PharmD PGY1 Pharmacy Resident 09/18/2016 11:05 AM

## 2016-09-18 NOTE — Progress Notes (Signed)
Dr Blenda Mounts rounding note reviewed from yesterday. Patient awaiting CABG tomorrow. He is on appropriate medical therapy for his NSTEMI, beta blocker held due to bradycardia on admission. No additional cardiology recs today, proceed with CABG tomorrow as planned.   Carlyle Dolly MD

## 2016-09-19 ENCOUNTER — Inpatient Hospital Stay (HOSPITAL_COMMUNITY): Payer: Medicare Other | Admitting: Anesthesiology

## 2016-09-19 ENCOUNTER — Encounter (HOSPITAL_COMMUNITY)
Admission: EM | Disposition: A | Discharge status: Home / Self Care | Payer: Self-pay | Source: Other Acute Inpatient Hospital | Attending: Internal Medicine

## 2016-09-19 ENCOUNTER — Inpatient Hospital Stay (HOSPITAL_COMMUNITY): Payer: Medicare Other

## 2016-09-19 DIAGNOSIS — I2510 Atherosclerotic heart disease of native coronary artery without angina pectoris: Secondary | ICD-10-CM

## 2016-09-19 DIAGNOSIS — Z951 Presence of aortocoronary bypass graft: Secondary | ICD-10-CM

## 2016-09-19 HISTORY — PX: CORONARY ARTERY BYPASS GRAFT: SHX141

## 2016-09-19 HISTORY — PX: TEE WITHOUT CARDIOVERSION: SHX5443

## 2016-09-19 HISTORY — PX: RADIAL ARTERY HARVEST: SHX5067

## 2016-09-19 LAB — POCT I-STAT, CHEM 8
BUN: 18 mg/dL (ref 6–20)
BUN: 16 mg/dL (ref 6–20)
BUN: 17 mg/dL (ref 6–20)
BUN: 15 mg/dL (ref 6–20)
BUN: 16 mg/dL (ref 6–20)
BUN: 15 mg/dL (ref 6–20)
BUN: 17 mg/dL (ref 6–20)
BUN: 16 mg/dL (ref 6–20)
CALCIUM ION: 1.19 mmol/L (ref 1.15–1.40)
CALCIUM ION: 1.16 mmol/L (ref 1.15–1.40)
CALCIUM ION: 1.04 mmol/L — AB (ref 1.15–1.40)
CALCIUM ION: 1.07 mmol/L — AB (ref 1.15–1.40)
CALCIUM ION: 1.03 mmol/L — AB (ref 1.15–1.40)
CHLORIDE: 99 mmol/L — AB (ref 101–111)
CHLORIDE: 100 mmol/L — AB (ref 101–111)
CREATININE: 1.1 mg/dL (ref 0.61–1.24)
CREATININE: 1.1 mg/dL (ref 0.61–1.24)
CREATININE: 1.2 mg/dL (ref 0.61–1.24)
CREATININE: 1.1 mg/dL (ref 0.61–1.24)
CREATININE: 1.1 mg/dL (ref 0.61–1.24)
CREATININE: 1.2 mg/dL (ref 0.61–1.24)
Calcium, Ion: 1.16 mmol/L (ref 1.15–1.40)
Calcium, Ion: 1.01 mmol/L — ABNORMAL LOW (ref 1.15–1.40)
Calcium, Ion: 1.09 mmol/L — ABNORMAL LOW (ref 1.15–1.40)
Chloride: 100 mmol/L — ABNORMAL LOW (ref 101–111)
Chloride: 102 mmol/L (ref 101–111)
Chloride: 97 mmol/L — ABNORMAL LOW (ref 101–111)
Chloride: 100 mmol/L — ABNORMAL LOW (ref 101–111)
Chloride: 99 mmol/L — ABNORMAL LOW (ref 101–111)
Chloride: 101 mmol/L (ref 101–111)
Creatinine, Ser: 1.1 mg/dL (ref 0.61–1.24)
Creatinine, Ser: 1 mg/dL (ref 0.61–1.24)
GLUCOSE: 97 mg/dL (ref 65–99)
GLUCOSE: 95 mg/dL (ref 65–99)
GLUCOSE: 97 mg/dL (ref 65–99)
GLUCOSE: 96 mg/dL (ref 65–99)
GLUCOSE: 168 mg/dL — AB (ref 65–99)
GLUCOSE: 170 mg/dL — AB (ref 65–99)
Glucose, Bld: 179 mg/dL — ABNORMAL HIGH (ref 65–99)
Glucose, Bld: 152 mg/dL — ABNORMAL HIGH (ref 65–99)
HCT: 27 % — ABNORMAL LOW (ref 39.0–52.0)
HCT: 25 % — ABNORMAL LOW (ref 39.0–52.0)
HCT: 27 % — ABNORMAL LOW (ref 39.0–52.0)
HCT: 24 % — ABNORMAL LOW (ref 39.0–52.0)
HCT: 26 % — ABNORMAL LOW (ref 39.0–52.0)
HCT: 26 % — ABNORMAL LOW (ref 39.0–52.0)
HEMATOCRIT: 24 % — AB (ref 39.0–52.0)
HEMATOCRIT: 25 % — AB (ref 39.0–52.0)
HEMOGLOBIN: 8.5 g/dL — AB (ref 13.0–17.0)
HEMOGLOBIN: 8.2 g/dL — AB (ref 13.0–17.0)
HEMOGLOBIN: 8.2 g/dL — AB (ref 13.0–17.0)
HEMOGLOBIN: 8.5 g/dL — AB (ref 13.0–17.0)
Hemoglobin: 9.2 g/dL — ABNORMAL LOW (ref 13.0–17.0)
Hemoglobin: 9.2 g/dL — ABNORMAL LOW (ref 13.0–17.0)
Hemoglobin: 8.8 g/dL — ABNORMAL LOW (ref 13.0–17.0)
Hemoglobin: 8.8 g/dL — ABNORMAL LOW (ref 13.0–17.0)
POTASSIUM: 4.8 mmol/L (ref 3.5–5.1)
Potassium: 3.9 mmol/L (ref 3.5–5.1)
Potassium: 3.5 mmol/L (ref 3.5–5.1)
Potassium: 3.6 mmol/L (ref 3.5–5.1)
Potassium: 3.5 mmol/L (ref 3.5–5.1)
Potassium: 4.5 mmol/L (ref 3.5–5.1)
Potassium: 4.7 mmol/L (ref 3.5–5.1)
Potassium: 3.9 mmol/L (ref 3.5–5.1)
SODIUM: 138 mmol/L (ref 135–145)
SODIUM: 136 mmol/L (ref 135–145)
SODIUM: 136 mmol/L (ref 135–145)
Sodium: 138 mmol/L (ref 135–145)
Sodium: 139 mmol/L (ref 135–145)
Sodium: 137 mmol/L (ref 135–145)
Sodium: 136 mmol/L (ref 135–145)
Sodium: 138 mmol/L (ref 135–145)
TCO2: 31 mmol/L (ref 0–100)
TCO2: 26 mmol/L (ref 0–100)
TCO2: 30 mmol/L (ref 0–100)
TCO2: 29 mmol/L (ref 0–100)
TCO2: 27 mmol/L (ref 0–100)
TCO2: 28 mmol/L (ref 0–100)
TCO2: 26 mmol/L (ref 0–100)
TCO2: 25 mmol/L (ref 0–100)

## 2016-09-19 LAB — CREATININE, SERUM
CREATININE: 1.2 mg/dL (ref 0.61–1.24)
GFR, EST NON AFRICAN AMERICAN: 60 mL/min — AB (ref >60)

## 2016-09-19 LAB — CBC
HCT: 35.9 % — ABNORMAL LOW (ref 39.0–52.0)
HCT: 27.6 % — ABNORMAL LOW (ref 39.0–52.0)
HEMATOCRIT: 24.6 % — AB (ref 39.0–52.0)
HEMOGLOBIN: 8.4 g/dL — AB (ref 13.0–17.0)
HEMOGLOBIN: 9.2 g/dL — AB (ref 13.0–17.0)
Hemoglobin: 12.3 g/dL — ABNORMAL LOW (ref 13.0–17.0)
MCH: 28.9 pg (ref 26.0–34.0)
MCH: 28.9 pg (ref 26.0–34.0)
MCH: 28.1 pg (ref 26.0–34.0)
MCHC: 34.3 g/dL (ref 30.0–36.0)
MCHC: 34.1 g/dL (ref 30.0–36.0)
MCHC: 33.3 g/dL (ref 30.0–36.0)
MCV: 84.5 fL (ref 78.0–100.0)
MCV: 84.5 fL (ref 78.0–100.0)
MCV: 84.4 fL (ref 78.0–100.0)
PLATELETS: 200 10*3/uL (ref 150–400)
PLATELETS: 130 10*3/uL — AB (ref 150–400)
Platelets: 108 10*3/uL — ABNORMAL LOW (ref 150–400)
RBC: 4.25 MIL/uL (ref 4.22–5.81)
RBC: 2.91 MIL/uL — ABNORMAL LOW (ref 4.22–5.81)
RBC: 3.27 MIL/uL — AB (ref 4.22–5.81)
RDW: 14 % (ref 11.5–15.5)
RDW: 14.2 % (ref 11.5–15.5)
RDW: 14.1 % (ref 11.5–15.5)
WBC: 6.8 10*3/uL (ref 4.0–10.5)
WBC: 6.1 10*3/uL (ref 4.0–10.5)
WBC: 7.9 10*3/uL (ref 4.0–10.5)

## 2016-09-19 LAB — COMPREHENSIVE METABOLIC PANEL
ALBUMIN: 3.1 g/dL — AB (ref 3.5–5.0)
ALT: 40 U/L (ref 17–63)
ANION GAP: 11 (ref 5–15)
AST: 41 U/L (ref 15–41)
Alkaline Phosphatase: 79 U/L (ref 38–126)
BUN: 17 mg/dL (ref 6–20)
CHLORIDE: 101 mmol/L (ref 101–111)
CO2: 26 mmol/L (ref 22–32)
Calcium: 8.8 mg/dL — ABNORMAL LOW (ref 8.9–10.3)
Creatinine, Ser: 1.32 mg/dL — ABNORMAL HIGH (ref 0.61–1.24)
GFR calc Af Amer: >60 mL/min (ref >60)
GFR, EST NON AFRICAN AMERICAN: 53 mL/min — AB (ref >60)
Glucose, Bld: 93 mg/dL (ref 65–99)
POTASSIUM: 3.5 mmol/L (ref 3.5–5.1)
Sodium: 138 mmol/L (ref 135–145)
TOTAL PROTEIN: 6.2 g/dL — AB (ref 6.5–8.1)
Total Bilirubin: 0.7 mg/dL (ref 0.3–1.2)

## 2016-09-19 LAB — POCT I-STAT 3, ART BLOOD GAS (G3+)
ACID-BASE EXCESS: 5 mmol/L — AB (ref 0.0–2.0)
Acid-base deficit: 2 mmol/L (ref 0.0–2.0)
Acid-base deficit: 1 mmol/L (ref 0.0–2.0)
BICARBONATE: 23.9 mmol/L (ref 20.0–28.0)
Bicarbonate: 28.6 mmol/L — ABNORMAL HIGH (ref 20.0–28.0)
Bicarbonate: 25.7 mmol/L (ref 20.0–28.0)
Bicarbonate: 24.4 mmol/L (ref 20.0–28.0)
O2 SAT: 100 %
O2 SAT: 95 %
O2 Saturation: 98 %
O2 Saturation: 94 %
PCO2 ART: 45.6 mmHg (ref 32.0–48.0)
PH ART: 7.481 — AB (ref 7.350–7.450)
PH ART: 7.36 (ref 7.350–7.450)
PH ART: 7.333 — AB (ref 7.350–7.450)
TCO2: 30 mmol/L (ref 0–100)
TCO2: 27 mmol/L (ref 0–100)
TCO2: 26 mmol/L (ref 0–100)
TCO2: 25 mmol/L (ref 0–100)
pCO2 arterial: 38.3 mmHg (ref 32.0–48.0)
pCO2 arterial: 45.1 mmHg (ref 32.0–48.0)
pCO2 arterial: 41.8 mmHg (ref 32.0–48.0)
pH, Arterial: 7.364 (ref 7.350–7.450)
pO2, Arterial: 315 mmHg — ABNORMAL HIGH (ref 83.0–108.0)
pO2, Arterial: 109 mmHg — ABNORMAL HIGH (ref 83.0–108.0)
pO2, Arterial: 75 mmHg — ABNORMAL LOW (ref 83.0–108.0)
pO2, Arterial: 76 mmHg — ABNORMAL LOW (ref 83.0–108.0)

## 2016-09-19 LAB — GLUCOSE, CAPILLARY
GLUCOSE-CAPILLARY: 120 mg/dL — AB (ref 65–99)
GLUCOSE-CAPILLARY: 151 mg/dL — AB (ref 65–99)
GLUCOSE-CAPILLARY: 150 mg/dL — AB (ref 65–99)
Glucose-Capillary: 122 mg/dL — ABNORMAL HIGH (ref 65–99)
Glucose-Capillary: 123 mg/dL — ABNORMAL HIGH (ref 65–99)
Glucose-Capillary: 126 mg/dL — ABNORMAL HIGH (ref 65–99)
Glucose-Capillary: 137 mg/dL — ABNORMAL HIGH (ref 65–99)

## 2016-09-19 LAB — HEMOGLOBIN A1C
HEMOGLOBIN A1C: 5.8 % — AB (ref 4.8–5.6)
MEAN PLASMA GLUCOSE: 120 mg/dL

## 2016-09-19 LAB — PROTIME-INR
INR: 1.56
PROTHROMBIN TIME: 18.9 s — AB (ref 11.4–15.2)

## 2016-09-19 LAB — APTT: aPTT: 36 seconds (ref 24–36)

## 2016-09-19 LAB — PLATELET COUNT: Platelets: 145 10*3/uL — ABNORMAL LOW (ref 150–400)

## 2016-09-19 LAB — POCT I-STAT 4, (NA,K, GLUC, HGB,HCT)
GLUCOSE: 133 mg/dL — AB (ref 65–99)
HCT: 21 % — ABNORMAL LOW (ref 39.0–52.0)
HEMOGLOBIN: 7.1 g/dL — AB (ref 13.0–17.0)
POTASSIUM: 3.7 mmol/L (ref 3.5–5.1)
Sodium: 140 mmol/L (ref 135–145)

## 2016-09-19 LAB — MAGNESIUM: MAGNESIUM: 2.8 mg/dL — AB (ref 1.7–2.4)

## 2016-09-19 LAB — HEMOGLOBIN AND HEMATOCRIT, BLOOD
HEMATOCRIT: 25.3 % — AB (ref 39.0–52.0)
HEMOGLOBIN: 8.9 g/dL — AB (ref 13.0–17.0)

## 2016-09-19 LAB — HEPARIN LEVEL (UNFRACTIONATED): HEPARIN UNFRACTIONATED: 0.48 [IU]/mL (ref 0.30–0.70)

## 2016-09-19 SURGERY — REDO CORONARY ARTERY BYPASS GRAFTING (CABG)
Anesthesia: General | Site: Chest

## 2016-09-19 MED ORDER — ALBUMIN HUMAN 5 % IV SOLN: 250.0000 mL | INTRAVENOUS | Status: AC | PRN

## 2016-09-19 MED ORDER — EPHEDRINE 5 MG/ML INJ
INTRAVENOUS | Status: AC
  Filled 2016-09-19: qty 10

## 2016-09-19 MED ORDER — ONDANSETRON HCL 4 MG/2ML IJ SOLN
4.0000 mg | Freq: Four times a day (QID) | INTRAMUSCULAR | Status: DC | PRN
  Administered 2016-09-19 – 2016-09-20 (×2): 4 mg via INTRAVENOUS
  Filled 2016-09-19 (×2): qty 2

## 2016-09-19 MED ORDER — VANCOMYCIN HCL IN DEXTROSE 1-5 GM/200ML-% IV SOLN
1000.0000 mg | Freq: Once | INTRAVENOUS | Status: AC
  Administered 2016-09-19: 1000 mg via INTRAVENOUS
  Filled 2016-09-19: qty 200

## 2016-09-19 MED ORDER — NITROGLYCERIN IN D5W 200-5 MCG/ML-% IV SOLN: 0.0000 ug/min | INTRAVENOUS | Status: DC

## 2016-09-19 MED ORDER — ALBUMIN HUMAN 5 % IV SOLN
INTRAVENOUS | Status: DC | PRN
  Administered 2016-09-19: 14:00:00 via INTRAVENOUS

## 2016-09-19 MED ORDER — BISACODYL 10 MG RE SUPP: 10.0000 mg | Freq: Every day | RECTAL | Status: DC

## 2016-09-19 MED ORDER — ACETAMINOPHEN 500 MG PO TABS
1000.0000 mg | ORAL_TABLET | Freq: Four times a day (QID) | ORAL | Status: DC
  Administered 2016-09-19 – 2016-09-21 (×7): 1000 mg via ORAL
  Filled 2016-09-19 (×6): qty 2

## 2016-09-19 MED ORDER — PANTOPRAZOLE SODIUM 40 MG PO TBEC
40.0000 mg | DELAYED_RELEASE_TABLET | Freq: Every day | ORAL | Status: DC
  Administered 2016-09-21: 40 mg via ORAL
  Filled 2016-09-19: qty 1

## 2016-09-19 MED ORDER — SODIUM CHLORIDE 0.9 % IJ SOLN
INTRAMUSCULAR | Status: AC
  Filled 2016-09-19: qty 10

## 2016-09-19 MED ORDER — PROTAMINE SULFATE 10 MG/ML IV SOLN
INTRAVENOUS | Status: AC
  Filled 2016-09-19: qty 5

## 2016-09-19 MED ORDER — TRAMADOL HCL 50 MG PO TABS
50.0000 mg | ORAL_TABLET | ORAL | Status: DC | PRN
  Administered 2016-09-20 (×2): 100 mg via ORAL
  Filled 2016-09-19 (×2): qty 2

## 2016-09-19 MED ORDER — FENTANYL CITRATE (PF) 250 MCG/5ML IJ SOLN
INTRAMUSCULAR | Status: AC
  Filled 2016-09-19: qty 5

## 2016-09-19 MED ORDER — SODIUM CHLORIDE 0.9 % IV SOLN
0.0000 ug/min | INTRAVENOUS | Status: DC
  Filled 2016-09-19: qty 2

## 2016-09-19 MED ORDER — MIDAZOLAM HCL 5 MG/5ML IJ SOLN
INTRAMUSCULAR | Status: DC | PRN
Start: 2016-09-19 — End: 2016-09-19
  Administered 2016-09-19: 6 mg via INTRAVENOUS
  Administered 2016-09-19 (×2): 2 mg via INTRAVENOUS

## 2016-09-19 MED ORDER — ROCURONIUM BROMIDE 10 MG/ML (PF) SYRINGE
PREFILLED_SYRINGE | INTRAVENOUS | Status: AC
  Filled 2016-09-19: qty 5

## 2016-09-19 MED ORDER — OXYCODONE HCL 5 MG PO TABS
5.0000 mg | ORAL_TABLET | ORAL | Status: DC | PRN
  Administered 2016-09-20: 5 mg via ORAL
  Filled 2016-09-19: qty 1

## 2016-09-19 MED ORDER — THROMBIN 20000 UNITS EX KIT
PACK | CUTANEOUS | Status: DC | PRN
  Administered 2016-09-19: 20000 [IU] via TOPICAL

## 2016-09-19 MED ORDER — ACETAMINOPHEN 160 MG/5ML PO SOLN: 650.0000 mg | Freq: Once | ORAL | Status: AC

## 2016-09-19 MED ORDER — ORAL CARE MOUTH RINSE
15.0000 mL | Freq: Two times a day (BID) | OROMUCOSAL | Status: DC
  Administered 2016-09-20 – 2016-09-21 (×3): 15 mL via OROMUCOSAL

## 2016-09-19 MED ORDER — AMIODARONE HCL IN DEXTROSE 360-4.14 MG/200ML-% IV SOLN
30.0000 mg/h | INTRAVENOUS | Status: AC
  Administered 2016-09-19: 60 mg/h via INTRAVENOUS
  Filled 2016-09-19: qty 200

## 2016-09-19 MED ORDER — ACETAMINOPHEN 160 MG/5ML PO SOLN: 1000.0000 mg | Freq: Four times a day (QID) | ORAL | Status: DC

## 2016-09-19 MED ORDER — SODIUM CHLORIDE 0.9% FLUSH: 3.0000 mL | INTRAVENOUS | Status: DC | PRN

## 2016-09-19 MED ORDER — MORPHINE SULFATE (PF) 4 MG/ML IV SOLN
1.0000 mg | INTRAVENOUS | Status: AC | PRN
  Administered 2016-09-19: 2 mg via INTRAVENOUS

## 2016-09-19 MED ORDER — LACTATED RINGERS IV SOLN
INTRAVENOUS | Status: DC | PRN
  Administered 2016-09-19 (×2): via INTRAVENOUS

## 2016-09-19 MED ORDER — SODIUM CHLORIDE 0.9 % IV SOLN
INTRAVENOUS | Status: DC
  Filled 2016-09-19: qty 2.5

## 2016-09-19 MED ORDER — SUCCINYLCHOLINE CHLORIDE 20 MG/ML IJ SOLN
INTRAMUSCULAR | Status: DC | PRN
  Administered 2016-09-19: 120 mg via INTRAVENOUS

## 2016-09-19 MED ORDER — ORAL CARE MOUTH RINSE: 15.0000 mL | Freq: Four times a day (QID) | OROMUCOSAL | Status: DC

## 2016-09-19 MED ORDER — DOCUSATE SODIUM 100 MG PO CAPS
200.0000 mg | ORAL_CAPSULE | Freq: Every day | ORAL | Status: DC
  Administered 2016-09-20 – 2016-09-21 (×2): 200 mg via ORAL
  Filled 2016-09-19 (×2): qty 2

## 2016-09-19 MED ORDER — THROMBIN 20000 UNITS EX SOLR
CUTANEOUS | Status: AC
  Filled 2016-09-19: qty 20000

## 2016-09-19 MED ORDER — CHLORHEXIDINE GLUCONATE 0.12% ORAL RINSE (MEDLINE KIT)
15.0000 mL | Freq: Two times a day (BID) | OROMUCOSAL | Status: DC
  Administered 2016-09-19: 15 mL via OROMUCOSAL

## 2016-09-19 MED ORDER — MORPHINE SULFATE (PF) 4 MG/ML IV SOLN
2.0000 mg | INTRAVENOUS | Status: DC | PRN
  Administered 2016-09-20: 4 mg via INTRAVENOUS
  Filled 2016-09-19 (×2): qty 1

## 2016-09-19 MED ORDER — MIDAZOLAM HCL 2 MG/2ML IJ SOLN: 2.0000 mg | INTRAMUSCULAR | Status: DC | PRN

## 2016-09-19 MED ORDER — PHENYLEPHRINE 40 MCG/ML (10ML) SYRINGE FOR IV PUSH (FOR BLOOD PRESSURE SUPPORT)
PREFILLED_SYRINGE | INTRAVENOUS | Status: AC
  Filled 2016-09-19: qty 10

## 2016-09-19 MED ORDER — AMIODARONE IV BOLUS ONLY 150 MG/100ML
INTRAVENOUS | Status: DC | PRN
  Administered 2016-09-19: 150 mg via INTRAVENOUS

## 2016-09-19 MED ORDER — SODIUM CHLORIDE 0.9% FLUSH
3.0000 mL | Freq: Two times a day (BID) | INTRAVENOUS | Status: DC
  Administered 2016-09-20 – 2016-09-21 (×3): 3 mL via INTRAVENOUS

## 2016-09-19 MED ORDER — HEPARIN SODIUM (PORCINE) 1000 UNIT/ML IJ SOLN
INTRAMUSCULAR | Status: DC | PRN
  Administered 2016-09-19: 5000 [IU] via INTRAVENOUS
  Administered 2016-09-19: 2000 [IU] via INTRAVENOUS
  Administered 2016-09-19: 38000 [IU] via INTRAVENOUS

## 2016-09-19 MED ORDER — SODIUM CHLORIDE 0.9 % IV SOLN
INTRAVENOUS | Status: DC
  Administered 2016-09-19: 16:00:00 via INTRAVENOUS

## 2016-09-19 MED ORDER — LIDOCAINE HCL (CARDIAC) 20 MG/ML IV SOLN
INTRAVENOUS | Status: DC | PRN
  Administered 2016-09-19 (×3): 250 mg via INTRAVENOUS
  Administered 2016-09-19: 200 mg via INTRAVENOUS
  Administered 2016-09-19 (×2): 250 mg via INTRAVENOUS

## 2016-09-19 MED ORDER — LACTATED RINGERS IV SOLN: INTRAVENOUS | Status: DC

## 2016-09-19 MED ORDER — ROCURONIUM BROMIDE 100 MG/10ML IV SOLN
INTRAVENOUS | Status: DC | PRN
  Administered 2016-09-19: 50 mg via INTRAVENOUS
  Administered 2016-09-19: 30 mg via INTRAVENOUS
  Administered 2016-09-19 (×2): 50 mg via INTRAVENOUS

## 2016-09-19 MED ORDER — PROTAMINE SULFATE 10 MG/ML IV SOLN
INTRAVENOUS | Status: DC | PRN
  Administered 2016-09-19: 400 mg via INTRAVENOUS

## 2016-09-19 MED ORDER — MORPHINE SULFATE (PF) 2 MG/ML IV SOLN: 2.0000 mg | INTRAVENOUS | Status: DC | PRN

## 2016-09-19 MED ORDER — SODIUM CHLORIDE 0.9 % IV SOLN
0.0000 ug/kg/h | INTRAVENOUS | Status: DC
  Filled 2016-09-19: qty 2

## 2016-09-19 MED ORDER — SODIUM CHLORIDE 0.45 % IV SOLN: INTRAVENOUS | Status: DC | PRN

## 2016-09-19 MED ORDER — POTASSIUM CHLORIDE 10 MEQ/50ML IV SOLN
10.0000 meq | INTRAVENOUS | Status: AC
  Administered 2016-09-19 (×3): 10 meq via INTRAVENOUS

## 2016-09-19 MED ORDER — LACTATED RINGERS IV SOLN
INTRAVENOUS | Status: DC | PRN
  Administered 2016-09-19 (×2): via INTRAVENOUS

## 2016-09-19 MED ORDER — HEMOSTATIC AGENTS (NO CHARGE) OPTIME
TOPICAL | Status: DC | PRN
  Administered 2016-09-19: 1 via TOPICAL

## 2016-09-19 MED ORDER — AMIODARONE HCL IN DEXTROSE 360-4.14 MG/200ML-% IV SOLN
30.0000 mg/h | INTRAVENOUS | Status: DC
  Administered 2016-09-19 – 2016-09-20 (×4): 30 mg/h via INTRAVENOUS
  Filled 2016-09-19: qty 200

## 2016-09-19 MED ORDER — DEXMEDETOMIDINE HCL IN NACL 400 MCG/100ML IV SOLN
0.4000 ug/kg/h | INTRAVENOUS | Status: DC
  Filled 2016-09-19: qty 100

## 2016-09-19 MED ORDER — ASPIRIN EC 325 MG PO TBEC
325.0000 mg | DELAYED_RELEASE_TABLET | Freq: Every day | ORAL | Status: DC
  Administered 2016-09-20 – 2016-09-21 (×2): 325 mg via ORAL
  Filled 2016-09-19 (×2): qty 1

## 2016-09-19 MED ORDER — BISACODYL 5 MG PO TBEC
10.0000 mg | DELAYED_RELEASE_TABLET | Freq: Every day | ORAL | Status: DC
  Administered 2016-09-20 – 2016-09-21 (×2): 10 mg via ORAL
  Filled 2016-09-19 (×2): qty 2

## 2016-09-19 MED ORDER — ACETAMINOPHEN 650 MG RE SUPP
650.0000 mg | Freq: Once | RECTAL | Status: AC
  Administered 2016-09-19: 650 mg via RECTAL

## 2016-09-19 MED ORDER — SUCCINYLCHOLINE CHLORIDE 200 MG/10ML IV SOSY
PREFILLED_SYRINGE | INTRAVENOUS | Status: AC
  Filled 2016-09-19: qty 10

## 2016-09-19 MED ORDER — PROPOFOL 10 MG/ML IV BOLUS
INTRAVENOUS | Status: AC
  Filled 2016-09-19: qty 20

## 2016-09-19 MED ORDER — CHLORHEXIDINE GLUCONATE 0.12 % MT SOLN
15.0000 mL | OROMUCOSAL | Status: AC
  Administered 2016-09-19: 15 mL via OROMUCOSAL
  Filled 2016-09-19: qty 15

## 2016-09-19 MED ORDER — HEPARIN SODIUM (PORCINE) 1000 UNIT/ML IJ SOLN
INTRAMUSCULAR | Status: AC
  Filled 2016-09-19: qty 1

## 2016-09-19 MED ORDER — LIDOCAINE 2% (20 MG/ML) 5 ML SYRINGE
INTRAMUSCULAR | Status: AC
  Filled 2016-09-19: qty 5

## 2016-09-19 MED ORDER — ASPIRIN 81 MG PO CHEW: 324.0000 mg | CHEWABLE_TABLET | Freq: Every day | ORAL | Status: DC

## 2016-09-19 MED ORDER — METOPROLOL TARTRATE 25 MG/10 ML ORAL SUSPENSION: 12.5000 mg | Freq: Two times a day (BID) | ORAL | Status: DC

## 2016-09-19 MED ORDER — INSULIN REGULAR BOLUS VIA INFUSION
0.0000 [IU] | Freq: Three times a day (TID) | INTRAVENOUS | Status: DC
  Filled 2016-09-19: qty 10

## 2016-09-19 MED ORDER — LACTATED RINGERS IV SOLN
500.0000 mL | Freq: Once | INTRAVENOUS | Status: DC | PRN
Start: 2016-09-19 — End: 2016-09-21

## 2016-09-19 MED ORDER — METOPROLOL TARTRATE 12.5 MG HALF TABLET
12.5000 mg | ORAL_TABLET | Freq: Two times a day (BID) | ORAL | Status: DC
  Administered 2016-09-20: 12.5 mg via ORAL
  Filled 2016-09-19: qty 1

## 2016-09-19 MED ORDER — 0.9 % SODIUM CHLORIDE (POUR BTL) OPTIME
TOPICAL | Status: DC | PRN
  Administered 2016-09-19: 6000 mL

## 2016-09-19 MED ORDER — LACTATED RINGERS IV SOLN
INTRAVENOUS | Status: DC | PRN
  Administered 2016-09-19: 08:00:00 via INTRAVENOUS

## 2016-09-19 MED ORDER — DOPAMINE-DEXTROSE 3.2-5 MG/ML-% IV SOLN: 2.0000 ug/kg/min | INTRAVENOUS | Status: DC

## 2016-09-19 MED ORDER — AMIODARONE HCL IN DEXTROSE 360-4.14 MG/200ML-% IV SOLN
60.0000 mg/h | INTRAVENOUS | Status: AC
  Administered 2016-09-19: 60 mg/h via INTRAVENOUS
  Filled 2016-09-19 (×2): qty 200

## 2016-09-19 MED ORDER — PROTAMINE SULFATE 10 MG/ML IV SOLN
INTRAVENOUS | Status: AC
  Filled 2016-09-19: qty 25

## 2016-09-19 MED ORDER — GELATIN ABSORBABLE MT POWD
OROMUCOSAL | Status: DC | PRN
  Administered 2016-09-19 (×3): 4 mL via TOPICAL

## 2016-09-19 MED ORDER — FENTANYL CITRATE (PF) 250 MCG/5ML IJ SOLN
INTRAMUSCULAR | Status: DC | PRN
  Administered 2016-09-19: 50 ug via INTRAVENOUS
  Administered 2016-09-19: 100 ug via INTRAVENOUS
  Administered 2016-09-19 (×2): 50 ug via INTRAVENOUS

## 2016-09-19 MED ORDER — FENTANYL CITRATE (PF) 250 MCG/5ML IJ SOLN
INTRAMUSCULAR | Status: AC
  Filled 2016-09-19: qty 30

## 2016-09-19 MED ORDER — MIDAZOLAM HCL 10 MG/2ML IJ SOLN
INTRAMUSCULAR | Status: AC
  Filled 2016-09-19: qty 2

## 2016-09-19 MED ORDER — FAMOTIDINE IN NACL 20-0.9 MG/50ML-% IV SOLN
20.0000 mg | Freq: Two times a day (BID) | INTRAVENOUS | Status: AC
  Administered 2016-09-19: 20 mg via INTRAVENOUS

## 2016-09-19 MED ORDER — SODIUM CHLORIDE 0.9 % IV SOLN: 250.0000 mL | INTRAVENOUS | Status: DC

## 2016-09-19 MED ORDER — METOPROLOL TARTRATE 5 MG/5ML IV SOLN: 2.5000 mg | INTRAVENOUS | Status: DC | PRN

## 2016-09-19 MED ORDER — MAGNESIUM SULFATE 4 GM/100ML IV SOLN
4.0000 g | Freq: Once | INTRAVENOUS | Status: AC
  Administered 2016-09-19: 4 g via INTRAVENOUS
  Filled 2016-09-19: qty 100

## 2016-09-19 MED ORDER — DEXTROSE 5 % IV SOLN
1.5000 g | Freq: Two times a day (BID) | INTRAVENOUS | Status: AC
  Administered 2016-09-19 – 2016-09-21 (×4): 1.5 g via INTRAVENOUS
  Filled 2016-09-19 (×4): qty 1.5

## 2016-09-19 SURGICAL SUPPLY — 117 items
ADAPTER CARDIO PERF ANTE/RETRO (ADAPTER) ×5 IMPLANT
BAG DECANTER FOR FLEXI CONT (MISCELLANEOUS) ×5 IMPLANT
BANDAGE ACE 4X5 VEL STRL LF (GAUZE/BANDAGES/DRESSINGS) ×5 IMPLANT
BANDAGE ACE 6X5 VEL STRL LF (GAUZE/BANDAGES/DRESSINGS) ×5 IMPLANT
BLADE CLIPPER SURG (BLADE) IMPLANT
BLADE CORE FAN STRYKER (BLADE) ×5 IMPLANT
BLADE SURG 15 STRL LF DISP TIS (BLADE) ×6 IMPLANT
BLADE SURG 15 STRL SS (BLADE) ×4
BNDG GAUZE ELAST 4 BULKY (GAUZE/BANDAGES/DRESSINGS) ×5 IMPLANT
CANISTER SUCT 3000ML PPV (MISCELLANEOUS) ×5 IMPLANT
CANNULA GUNDRY RCSP 15FR (MISCELLANEOUS) IMPLANT
CATH HEART VENT LEFT (CATHETERS) ×3 IMPLANT
CATH ROBINSON RED A/P 18FR (CATHETERS) ×10 IMPLANT
CATH THORACIC 28FR (CATHETERS) ×5 IMPLANT
CATH THORACIC 36FR (CATHETERS) ×5 IMPLANT
CATH THORACIC 36FR RT ANG (CATHETERS) ×5 IMPLANT
CLIP FOGARTY SPRING 6M (CLIP) IMPLANT
CLIP TI MEDIUM 24 (CLIP) IMPLANT
CLIP TI WIDE RED SMALL 24 (CLIP) ×5 IMPLANT
CONN ST 1/4X3/8  BEN (MISCELLANEOUS) ×2
CONN ST 1/4X3/8 BEN (MISCELLANEOUS) ×3 IMPLANT
CONN Y 3/8X3/8X3/8  BEN (MISCELLANEOUS) ×2
CONN Y 3/8X3/8X3/8 BEN (MISCELLANEOUS) ×3 IMPLANT
COVER MAYO STAND STRL (DRAPES) ×10 IMPLANT
CRADLE DONUT ADULT HEAD (MISCELLANEOUS) ×5 IMPLANT
DRAPE CARDIOVASCULAR INCISE (DRAPES) ×2
DRAPE HALF SHEET 40X57 (DRAPES) ×15 IMPLANT
DRAPE SLUSH/WARMER DISC (DRAPES) IMPLANT
DRAPE SRG 135X102X78XABS (DRAPES) ×3 IMPLANT
DRSG COVADERM 4X14 (GAUZE/BANDAGES/DRESSINGS) ×5 IMPLANT
ELECT BLADE 4.0 EZ CLEAN MEGAD (MISCELLANEOUS) ×5
ELECT CAUTERY BLADE 6.4 (BLADE) ×5 IMPLANT
ELECT REM PT RETURN 9FT ADLT (ELECTROSURGICAL) ×10
ELECTRODE BLDE 4.0 EZ CLN MEGD (MISCELLANEOUS) ×3 IMPLANT
ELECTRODE REM PT RTRN 9FT ADLT (ELECTROSURGICAL) ×6 IMPLANT
FELT TEFLON 1X6 (MISCELLANEOUS) ×5 IMPLANT
GAUZE SPONGE 4X4 12PLY STRL (GAUZE/BANDAGES/DRESSINGS) ×10 IMPLANT
GAUZE SPONGE 4X4 12PLY STRL LF (GAUZE/BANDAGES/DRESSINGS) ×10 IMPLANT
GEL ULTRASOUND 20GR AQUASONIC (MISCELLANEOUS) ×5 IMPLANT
GLOVE BIO SURGEON STRL SZ 6 (GLOVE) ×10 IMPLANT
GLOVE BIO SURGEON STRL SZ 6.5 (GLOVE) ×16 IMPLANT
GLOVE BIO SURGEON STRL SZ7 (GLOVE) IMPLANT
GLOVE BIO SURGEON STRL SZ7.5 (GLOVE) ×10 IMPLANT
GLOVE BIO SURGEON STRL SZ8 (GLOVE) ×10 IMPLANT
GLOVE BIO SURGEONS STRL SZ 6.5 (GLOVE) ×4
GLOVE BIOGEL PI IND STRL 6 (GLOVE) ×3 IMPLANT
GLOVE BIOGEL PI IND STRL 6.5 (GLOVE) IMPLANT
GLOVE BIOGEL PI IND STRL 7.0 (GLOVE) IMPLANT
GLOVE BIOGEL PI INDICATOR 6 (GLOVE) ×2
GLOVE BIOGEL PI INDICATOR 6.5 (GLOVE)
GLOVE BIOGEL PI INDICATOR 7.0 (GLOVE)
GLOVE EUDERMIC 7 POWDERFREE (GLOVE) ×10 IMPLANT
GLOVE ORTHO TXT STRL SZ7.5 (GLOVE) IMPLANT
GOWN STRL REUS W/ TWL LRG LVL3 (GOWN DISPOSABLE) ×24 IMPLANT
GOWN STRL REUS W/ TWL XL LVL3 (GOWN DISPOSABLE) ×3 IMPLANT
GOWN STRL REUS W/TWL LRG LVL3 (GOWN DISPOSABLE) ×16
GOWN STRL REUS W/TWL XL LVL3 (GOWN DISPOSABLE) ×2
HARMONIC SHEARS 14CM COAG (MISCELLANEOUS) ×5 IMPLANT
HEMOSTAT POWDER SURGIFOAM 1G (HEMOSTASIS) ×15 IMPLANT
HEMOSTAT SURGICEL 2X14 (HEMOSTASIS) ×5 IMPLANT
INSERT FOGARTY 61MM (MISCELLANEOUS) IMPLANT
INSERT FOGARTY XLG (MISCELLANEOUS) IMPLANT
IV CATH 18G X1.75 CATHLON (IV SOLUTION) ×5 IMPLANT
KIT BASIN OR (CUSTOM PROCEDURE TRAY) ×5 IMPLANT
KIT CATH CPB BARTLE (MISCELLANEOUS) ×5 IMPLANT
KIT ROOM TURNOVER OR (KITS) ×5 IMPLANT
KIT SUCTION CATH 14FR (SUCTIONS) ×5 IMPLANT
KIT VASOVIEW HEMOPRO VH 3000 (KITS) IMPLANT
LINE VENT (MISCELLANEOUS) ×5 IMPLANT
NS IRRIG 1000ML POUR BTL (IV SOLUTION) ×30 IMPLANT
PACK OPEN HEART (CUSTOM PROCEDURE TRAY) ×5 IMPLANT
PAD ARMBOARD 7.5X6 YLW CONV (MISCELLANEOUS) ×10 IMPLANT
PAD DEFIB R2 (MISCELLANEOUS) ×5 IMPLANT
PAD ELECT DEFIB RADIOL ZOLL (MISCELLANEOUS) ×5 IMPLANT
PENCIL BUTTON HOLSTER BLD 10FT (ELECTRODE) ×5 IMPLANT
PUNCH AORTIC ROT 4.0MM RCL 40 (MISCELLANEOUS) ×5 IMPLANT
PUNCH AORTIC ROTATE 4.0MM (MISCELLANEOUS) ×5 IMPLANT
PUNCH AORTIC ROTATE 4.5MM 8IN (MISCELLANEOUS) ×5 IMPLANT
PUNCH AORTIC ROTATE 5MM 8IN (MISCELLANEOUS) IMPLANT
SET CARDIOPLEGIA MPS 5001102 (MISCELLANEOUS) ×5 IMPLANT
SHEARS HARMONIC 9CM CVD (BLADE) ×5 IMPLANT
SPONGE INTESTINAL PEANUT (DISPOSABLE) IMPLANT
SPONGE LAP 18X18 X RAY DECT (DISPOSABLE) ×15 IMPLANT
SPONGE LAP 4X18 X RAY DECT (DISPOSABLE) ×10 IMPLANT
SUT BONE WAX W31G (SUTURE) ×5 IMPLANT
SUT PROLENE 3 0 SH DA (SUTURE) IMPLANT
SUT PROLENE 3 0 SH1 36 (SUTURE) ×10 IMPLANT
SUT PROLENE 4 0 RB 1 (SUTURE) ×4
SUT PROLENE 4-0 RB1 .5 CRCL 36 (SUTURE) ×6 IMPLANT
SUT PROLENE 7 0 BV 1 (SUTURE) ×15 IMPLANT
SUT PROLENE 7 0 BV1 MDA (SUTURE) ×5 IMPLANT
SUT PROLENE 8 0 BV175 6 (SUTURE) ×15 IMPLANT
SUT PROLENE BLUE 7 0 (SUTURE) IMPLANT
SUT SILK  1 MH (SUTURE) ×2
SUT SILK 1 MH (SUTURE) ×3 IMPLANT
SUT SILK 2 0 SH CR/8 (SUTURE) ×5 IMPLANT
SUT VIC AB 1 CTX 36 (SUTURE) ×4
SUT VIC AB 1 CTX36XBRD ANBCTR (SUTURE) ×6 IMPLANT
SUT VIC AB 2-0 CT1 27 (SUTURE) ×2
SUT VIC AB 2-0 CT1 TAPERPNT 27 (SUTURE) ×3 IMPLANT
SUT VIC AB 3-0 SH 27 (SUTURE)
SUT VIC AB 3-0 SH 27X BRD (SUTURE) IMPLANT
SUT VIC AB 3-0 X1 27 (SUTURE) ×10 IMPLANT
SUTURE E-PAK OPEN HEART (SUTURE) ×5 IMPLANT
SYR 50ML SLIP (SYRINGE) ×5 IMPLANT
SYSTEM SAHARA CHEST DRAIN ATS (WOUND CARE) ×5 IMPLANT
TAPE CLOTH SURG 4X10 WHT LF (GAUZE/BANDAGES/DRESSINGS) ×5 IMPLANT
TAPE PAPER 2X10 WHT MICROPORE (GAUZE/BANDAGES/DRESSINGS) ×5 IMPLANT
TOWEL GREEN STERILE (TOWEL DISPOSABLE) ×10 IMPLANT
TOWEL GREEN STERILE FF (TOWEL DISPOSABLE) ×8 IMPLANT
TOWEL OR 17X24 6PK STRL BLUE (TOWEL DISPOSABLE) IMPLANT
TOWEL OR 17X26 10 PK STRL BLUE (TOWEL DISPOSABLE) IMPLANT
TRAY FOLEY SILVER 16FR TEMP (SET/KITS/TRAYS/PACK) ×5 IMPLANT
TUBING INSUFFLATION (TUBING) IMPLANT
UNDERPAD 30X30 (UNDERPADS AND DIAPERS) ×5 IMPLANT
VENT LEFT HEART 12002 (CATHETERS) ×5
WATER STERILE IRR 1000ML POUR (IV SOLUTION) ×10 IMPLANT

## 2016-09-19 NOTE — Brief Op Note (Signed)
09/14/2016 - 09/19/2016  12:55 PM  PATIENT:  [name]  71 y.o. male  PRE-OPERATIVE DIAGNOSIS:  CAD  POST-OPERATIVE DIAGNOSIS:  CAD  PROCEDURE:  Procedure(s): REDO CORONARY ARTERY BYPASS GRAFTING (CABG) x two, using right internal mammary artery and left radial artery (N/A) TRANSESOPHAGEAL ECHOCARDIOGRAM (TEE) (N/A) RADIAL ARTERY HARVEST (Left) LEFT RADIAL-RCA FREE RIMA-RCA  SURGEON:  Surgeon(s) and Role:    * Bartle, Fernande Boyden, MD - Primary  PHYSICIAN ASSISTANT: WAYNE GOLD PA-C  ANESTHESIA:   general  EBL:  Total I/O In: 1600 [I.V.:1600] Out: 250 [Urine:150; Blood:100]  BLOOD ADMINISTERED:none  DRAINS: 3  Chest Tube(s) in the PLEURAL SPACES AND PERICARDIUM   LOCAL MEDICATIONS USED:  NONE  SPECIMEN:  No Specimen  DISPOSITION OF SPECIMEN:  PATHOLOGY  COUNTS:  YES  TOURNIQUET:  * No tourniquets in log *  DICTATION: .Dragon Dictation  PLAN OF CARE: Admit to inpatient   PATIENT DISPOSITION:  ICU - intubated and hemodynamically stable.   Delay start of Pharmacological VTE agent (>24hrs) due to surgical blood loss or risk of bleeding: yes  COMPLICATIONS: NO KNOWN

## 2016-09-19 NOTE — Anesthesia Preprocedure Evaluation (Addendum)
Anesthesia Evaluation  Patient identified by MRN, date of birth, ID band Patient awake    Reviewed: Allergy & Precautions, NPO status , Patient's Chart, lab work & pertinent test results  History of Anesthesia Complications (+) PONV  Airway Mallampati: II       Dental   Pulmonary shortness of breath, sleep apnea , COPD, neg recent URI, Current Smoker,    breath sounds clear to auscultation       Cardiovascular hypertension, + angina + CAD and + Past MI  + dysrhythmias + Valvular Problems/Murmurs  Rhythm:Regular Rate:Normal     Neuro/Psych    GI/Hepatic Neg liver ROS, hiatal hernia, GERD  ,  Endo/Other  Hypothyroidism   Renal/GU negative Renal ROS     Musculoskeletal  (+) Arthritis ,   Abdominal   Peds  Hematology   Anesthesia Other Findings   Reproductive/Obstetrics                            Anesthesia Physical Anesthesia Plan  ASA: IV  Anesthesia Plan: General   Post-op Pain Management:    Induction: Intravenous  Airway Management Planned: Oral ETT  Additional Equipment: Arterial line, TEE and PA Cath  Intra-op Plan:   Post-operative Plan: Post-operative intubation/ventilation  Informed Consent: I have reviewed the patients History and Physical, chart, labs and discussed the procedure including the risks, benefits and alternatives for the proposed anesthesia with the patient or authorized representative who has indicated his/her understanding and acceptance.   Dental advisory given  Plan Discussed with: CRNA and Anesthesiologist  Anesthesia Plan Comments:         Anesthesia Quick Evaluation

## 2016-09-19 NOTE — Op Note (Signed)
CARDIOVASCULAR SURGERY OPERATIVE NOTE  09/19/2016  Surgeon:  [name], MD  First Assistant: Jadene Pierini, PA-C   Preoperative Diagnosis:  Severe multi-vessel coronary artery disease   Postoperative Diagnosis:  Same   Procedure:  1. Redo Median Sternotomy 2. Extracorporeal circulation 3.   Redo Coronary artery bypass grafting x 2   Free right internal mammary artery graft to the diagonal  Left radial artery graft to the distal RCA   4.   Harvest of left radial artery   Anesthesia:  General Endotracheal   Clinical History/Surgical Indication:  The patient is a 71 year old gentleman with a history of hypertension, hyperlipidemia, hypothyroidism, ongoing 1 ppd smoking with COPD, and CAD s/p CABG x 5 by me in 2008. He had a LIMA to the LAD, SVG to D2, sequential SVG to D1 and OM and a SVG to the PL RCA. He subsequently underwent PCI with DES to the RCA vein graft in 2015. He reports that last year he had about a month of frequent substernal chest pain with exertion and thought it was indigestion. He was followed at the [facility] and had a cardiac workup including a cath. He says that he was told that everything was stable and there was no significant blockages. He was ok until the past 3 weeks when he has had recurrent chest pain. He has been woken up at night by chest pain every night. Then last night he had severe constant chest pain and went to [place]. This was associated with nausea and diaphoresis. He took NTG and it helped but the pain recurred quickly and a second NTG did not help so he went to the hospital. His troponin here was 0.33 and repeats were 0.43 and 0.39. He underwent cath today showing severe native 3-vessel CAD. There was a patent LIMA to the LAD. The SVG to the PL RCA is chronically occluded and the PL system is small. The SVG to D1 and OM has a 99% stenosis near the ostium  with diffuse disease and slow flow to the OM. The SVG to D2 is diffusely diseased with 95% stenosis in the distal third of the graft and 50% stenosis prior to its anastomosis. The LVEF is 45-50%.   This 71 year old gentleman has severe native 3-vessel CAD with diffusely diseased and severely stenosed vein grafts to the branching D2 and OM with unstable angina and NSTEMI. I have reviewed his previous operative note and the D2 was diffusely diseased but the OM was a large vessel. Both of these appear to be small to moderate sized vessels on cath and are smaller than the catheter but look like they may be graftable. The RCA system only has a significant stenosis distally before the PL, which was grafted before is too small to graft again. I used the right saphenous vein for his initial surgery after examining the left saphenous vein and finding it to be small, branching and not suitable. Therefore he probably does not have any suitable saphenous vein remaining. His upper extremity arterial dopplers show a normal palmar arch on the left with negative Allen's test so his left radial artery can be used. I think his operative risk is high given his age, redo status, stage III CKD with creatinine of 1.59 this am, ongoing heavy smoking for many years and  COPD. Unfortunately he is having chest pain at rest and the two vein bypasses that are open but severely narrowed are not amenable to PCI. I discussed the  operative procedure with the patient and his wife including alternatives, benefits and risks; including but not limited to bleeding, blood transfusion, infection, stroke, myocardial infarction, graft failure, heart block requiring a permanent pacemaker, organ dysfunction, and death.  [name] understands and agrees to proceed.     Preparation:  The patient was seen in the preoperative holding area and the correct patient, correct operation were confirmed with the patient after reviewing the medical record and  catheterization. The consent was signed by me. Preoperative antibiotics were given. A pulmonary arterial line and radial arterial line were placed by the anesthesia team. The patient was taken back to the operating room and positioned supine on the operating room table. After being placed under general endotracheal anesthesia by the anesthesia team a foley catheter was placed. The left arm was placed out to the patient's side using an arm board. The neck, chest, abdomen, left arm and both legs were prepped with betadine soap and solution and draped in the usual sterile manner. A surgical time-out was taken and the correct patient and operative procedure were confirmed with the nursing and anesthesia staff.  TEE: performed by Dr. Belinda Block  The LVEF was 55%. There was mild AS and mild to moderate AI. No significant MR.  Left radial artery harvest:  The left radial artery was exposed through a longitudinal incision over the artery from the wrist to the antecubital fossa. The artery was exposed distally, just proximal to the wrist and clamped with a vascular bulldog. There was still a good doppler signal beyond the clamp. The side branches were divided using the harmonic scalpel and the large branches were then clipped. The vessel was harvested as a pedicle graft with the two veins. The patient was given 2000 units of heparin and then the artery was divided distally. The stump at the wrist was suture ligated with 2-0 silk. Then the artery was divided proximally and the stump suture ligated with 2-0 silk. The artery was flushed with papaverine saline solution. Hemostasis was achieved and the incision closed in layers using 2-0 Vicryl to approximate the fascia and subcutaneous tissue and 3-0 Vicryl subcuticular skin closure. The incision was covered with a dry sterile dressing, kerlix and an ace wrap and re-postioned at the patient's side.    Cardiopulmonary Bypass:  A median sternotomy was performed  using the oscillating saw without difficulty. The sternal edges were retracted with bone hooks and the sternum separated from the heart using electrocautery.  . Right ventricular function appeared normal. Dissection was performed to expose the right atrium and ascending aorta.The ascending aorta was of normal size. There was some palpable plaque in the aortic root and posteriorly in the distal ascending aorta.  There were no contraindications to aortic cannulation or cross-clamping. The patient was fully systemically heparinized and the ACT was maintained > 400 sec. The proximal aortic arch was cannulated with a 20 F aortic cannula for arterial inflow. Venous cannulation was performed via the right atrial appendage using a two-staged venous cannula. An antegrade cardioplegia/vent cannula was inserted into the ascending aorta. A retrograde cardioplegia cannula was placed into the right atrium and advanced into the coronary sinus without difficulty. Once on bypass the remainder of the heart was dissected free from the pericardium. The left internal mammary pedicle was identified as it entered the left pericardium and was carefully clamped with an atraumatic vascular bulldog.  Aortic occlusion was performed with a single cross-clamp. Systemic cooling to 32 degrees Centigrade and topical  cooling of the heart with iced saline were used. Hyperkalemic antegrade and retrograde cold blood cardioplegia was used to induce diastolic arrest and was then given at about 20 minute intervals throughout the period of arrest to maintain myocardial temperature at or below 10 degrees centigrade. A temperature probe was inserted into the interventricular septum and an insulating pad was placed in the pericardium.   Right internal mammary harvest:  The right side of the sternum was retracted using the Rultract retractor. The right internal mammary artery was harvested as a pedicle graft. All side branches were clipped. It was a  medium-sized vessel of good quality with excellent blood flow. It was ligated distally and divided. It was sprayed with topical papaverine solution to prevent vasospasm. When ready to use the proximal end was divided and the stump suture ligated with 2-0 silk suture.    Coronary arteries:  The coronary arteries were examined.    LAD:  The old LIMA graft was left intact and carefully preserved. The diagonal branch was identified. It was diffusely diseased and small and I did not feel that it was graftable. The hood of the old vein graft was still fairly soft so the RIMA graft was placed to the hood of the old vein graft.  LCX:  The OM vein graft was severely diseased with calcific plaque and this extended to the distal anastomosis. The OM itself was small and diffusely diseased with calcific plaque and there was no where to open it. Since the hood of the vein graft was also badly diseased I decided not to graft this vessel.  RCA:  The main RCA had some proximal narrowing that I estimated at 60% although cardiology did not note it on the cath.  The proximal and mid RCA were diffusely diseased on inspection. The distal vessel just before the PDA was still moderately diseased but soft enough to graft. Since I could not use the radial artery graft for the OM I decided to use it to graft the RCA. The distal most aspect that was grafted previously was small and diffusely diseased and not graftable.   Grafts:  1. Free RIMA to the Diagonal: 1.75 mm at the hood of the old vein graft but a 1.5 mm probe would not extend down into the distal subranches. It was sewn end to side using 8-0 prolene continuous suture. 2. Left radial artery graft to distal RCA:  2.5 mm. It was sewn end to side using 8-0 prolene continuous suture.    The proximal graft anastomoses were performed to the old diagonal and RCA vein graft hoods which were still soft using continuous 7-0 prolene suture. Graft markers were placed around  the proximal anastomoses.   Completion:  The patient was rewarmed to 37 degrees Centigrade. The clamp was removed from the LIMA pedicle and there was rapid warming of the septum and return of ventricular fibrillation. The crossclamp was removed with a time of 133 minutes. The patient was defibrillated into sinus rhythm. The distal and proximal anastomoses were checked for hemostasis. The position of the grafts was satisfactory. Two temporary epicardial pacing wires were placed on the right atrium and two on the right ventricle. The patient was weaned from CPB without difficulty on dopamine 2 mcg that was started for renal perfusion.  CPB time was 198 minutes. Cardiac output was 5.5 LPM. TEE showed preserved LV function. Heparin was fully reversed with protamine and the aortic and venous cannulas removed. Hemostasis was achieved. Mediastinal and left  pleural drainage tubes were placed. The sternum was closed with double #6 stainless steel wires. The fascia was closed with continuous # 1 vicryl suture. The subcutaneous tissue was closed with 2-0 vicryl continuous suture. The skin was closed with 3-0 vicryl subcuticular suture. All sponge, needle, and instrument counts were reported correct at the end of the case. Dry sterile dressings were placed over the incisions and around the chest tubes which were connected to pleurevac suction. The patient was then transported to the surgical intensive care unit in critical but stable condition.

## 2016-09-19 NOTE — Anesthesia Procedure Notes (Signed)
Central Venous Catheter Insertion Performed by: Myrtie Soman, anesthesiologist Start/End5/11/2016 6:43 AM, 09/19/2016 6:58 AM Patient location: Pre-op. Preanesthetic checklist: patient identified, IV checked, site marked, risks and benefits discussed, surgical consent, monitors and equipment checked, pre-op evaluation, timeout performed and anesthesia consent Position: Trendelenburg Lidocaine 1% used for infiltration and patient sedated Hand hygiene performed  and maximum sterile barriers used  Catheter size: 9 Fr PA cath was placed.MAC introducer Swan type:thermodilution PA Cath depth:45 Procedure performed using ultrasound guided technique. Ultrasound Notes:anatomy identified, needle tip was noted to be adjacent to the nerve/plexus identified, no ultrasound evidence of intravascular and/or intraneural injection and image(s) printed for medical record Attempts: 1 Following insertion, line sutured, dressing applied and Biopatch. Post procedure assessment: blood return through all ports  Patient tolerated the procedure well with no immediate complications.

## 2016-09-19 NOTE — Progress Notes (Signed)
  Echocardiogram Echocardiogram Transesophageal has been performed.  [name] 09/19/2016, 11:10 AM

## 2016-09-19 NOTE — OR Nursing (Signed)
14:00 - 45 minute call to SICU charge nurse 14:45 - 20 minute call to SICU charge nurse

## 2016-09-19 NOTE — Anesthesia Postprocedure Evaluation (Signed)
Anesthesia Post Note  Patient: [name]  Procedure(s) Performed: Procedure(s) (LRB): REDO CORONARY ARTERY BYPASS GRAFTING (CABG) x two, using right internal mammary artery and left radial artery (N/A) TRANSESOPHAGEAL ECHOCARDIOGRAM (TEE) (N/A) RADIAL ARTERY HARVEST (Left)  Patient location during evaluation: PACU Anesthesia Type: General Level of consciousness: patient remains intubated per anesthesia plan Respiratory status: patient remains intubated per anesthesia plan Cardiovascular status: stable Anesthetic complications: no       Last Vitals:  Vitals:   09/19/16 1715 09/19/16 1730  BP:    Pulse: 89 88  Resp: 16 14  Temp: 36.1 C 36.1 C    Last Pain:  Vitals:   09/19/16 0500  TempSrc: Oral  PainSc:                  Angelus Hoopes

## 2016-09-19 NOTE — Progress Notes (Signed)
Patient was extubated at 2130 to 4L nasal cannula. Patient performed NIF -25 and VC 700 with good patient effort. Patient tolerated procedure well and was able to vocalize. RT will continue to monitor.

## 2016-09-19 NOTE — Transfer of Care (Signed)
Immediate Anesthesia Transfer of Care Note  Patient: [name]  Procedure(s) Performed: Procedure(s): REDO CORONARY ARTERY BYPASS GRAFTING (CABG) x two, using right internal mammary artery and left radial artery (N/A) TRANSESOPHAGEAL ECHOCARDIOGRAM (TEE) (N/A) RADIAL ARTERY HARVEST (Left)  Patient Location: SICU  Anesthesia Type:General  Level of Consciousness: Patient remains intubated per anesthesia plan  Airway & Oxygen Therapy: Patient remains intubated per anesthesia plan and Patient placed on Ventilator (see vital sign flow sheet for setting)  Post-op Assessment: Report given to RN, Post -op Vital signs reviewed and stable, Patient moving all extremities and Patient moving all extremities X 4  Post vital signs: Reviewed and stable  Last Vitals:  Vitals:   09/18/16 2135 09/19/16 0500  BP: (!) 167/67 (!) 148/62  Pulse: 66 65  Resp: 20   Temp: 36.6 C 36.7 C    Last Pain:  Vitals:   09/19/16 0500  TempSrc: Oral  PainSc:       Patients Stated Pain Goal: 0 (66/06/30 1601)  Complications: No apparent anesthesia complications

## 2016-09-19 NOTE — Anesthesia Procedure Notes (Signed)
Procedure Name: Intubation Date/Time: 09/19/2016 7:47 AM Performed by: Izora Gala Pre-anesthesia Checklist: Patient identified, Emergency Drugs available, Suction available and Patient being monitored Patient Re-evaluated:Patient Re-evaluated prior to inductionOxygen Delivery Method: Circle system utilized Preoxygenation: Pre-oxygenation with 100% oxygen Intubation Type: IV induction Ventilation: Mask ventilation without difficulty Laryngoscope Size: Miller and 3 Grade View: Grade I Tube type: Oral Tube size: 8.0 mm Number of attempts: 1 Placement Confirmation: ETT inserted through vocal cords under direct vision,  positive ETCO2 and breath sounds checked- equal and bilateral Secured at: 22 cm Dental Injury: Teeth and Oropharynx as per pre-operative assessment

## 2016-09-20 ENCOUNTER — Encounter (HOSPITAL_COMMUNITY): Payer: Self-pay | Admitting: Surgery

## 2016-09-20 ENCOUNTER — Inpatient Hospital Stay (HOSPITAL_COMMUNITY): Payer: Medicare Other

## 2016-09-20 LAB — GLUCOSE, CAPILLARY
GLUCOSE-CAPILLARY: 109 mg/dL — AB (ref 65–99)
GLUCOSE-CAPILLARY: 109 mg/dL — AB (ref 65–99)
GLUCOSE-CAPILLARY: 113 mg/dL — AB (ref 65–99)
GLUCOSE-CAPILLARY: 120 mg/dL — AB (ref 65–99)
Glucose-Capillary: 113 mg/dL — ABNORMAL HIGH (ref 65–99)
Glucose-Capillary: 89 mg/dL (ref 65–99)
Glucose-Capillary: 128 mg/dL — ABNORMAL HIGH (ref 65–99)
Glucose-Capillary: 126 mg/dL — ABNORMAL HIGH (ref 65–99)
Glucose-Capillary: 127 mg/dL — ABNORMAL HIGH (ref 65–99)

## 2016-09-20 LAB — VAS US DOPPLER PRE CABG
LEFT ECA DIAS: -17 cm/s
LEFT VERTEBRAL DIAS: 14 cm/s
LICADDIAS: -18 cm/s
LICADSYS: -70 cm/s
LICAPDIAS: -47 cm/s
LICAPSYS: -213 cm/s
Left CCA dist dias: 20 cm/s
Left CCA dist sys: 140 cm/s
Left CCA prox dias: 18 cm/s
Left CCA prox sys: 126 cm/s
RCCAPDIAS: 18 cm/s
Right CCA prox sys: 219 cm/s
Right cca dist sys: -62 cm/s

## 2016-09-20 LAB — CBC
HEMATOCRIT: 27.4 % — AB (ref 39.0–52.0)
HEMATOCRIT: 24.8 % — AB (ref 39.0–52.0)
Hemoglobin: 9.2 g/dL — ABNORMAL LOW (ref 13.0–17.0)
Hemoglobin: 8.3 g/dL — ABNORMAL LOW (ref 13.0–17.0)
MCH: 28.4 pg (ref 26.0–34.0)
MCH: 28.7 pg (ref 26.0–34.0)
MCHC: 33.6 g/dL (ref 30.0–36.0)
MCHC: 33.5 g/dL (ref 30.0–36.0)
MCV: 84.6 fL (ref 78.0–100.0)
MCV: 85.8 fL (ref 78.0–100.0)
PLATELETS: 137 10*3/uL — AB (ref 150–400)
Platelets: 132 10*3/uL — ABNORMAL LOW (ref 150–400)
RBC: 3.24 MIL/uL — ABNORMAL LOW (ref 4.22–5.81)
RBC: 2.89 MIL/uL — ABNORMAL LOW (ref 4.22–5.81)
RDW: 14.3 % (ref 11.5–15.5)
RDW: 14.6 % (ref 11.5–15.5)
WBC: 8.7 10*3/uL (ref 4.0–10.5)
WBC: 8.4 10*3/uL (ref 4.0–10.5)

## 2016-09-20 LAB — BASIC METABOLIC PANEL
Anion gap: 6 (ref 5–15)
BUN: 15 mg/dL (ref 6–20)
CALCIUM: 7.7 mg/dL — AB (ref 8.9–10.3)
CO2: 25 mmol/L (ref 22–32)
CREATININE: 1.21 mg/dL (ref 0.61–1.24)
Chloride: 104 mmol/L (ref 101–111)
GFR, EST NON AFRICAN AMERICAN: 59 mL/min — AB (ref >60)
Glucose, Bld: 114 mg/dL — ABNORMAL HIGH (ref 65–99)
Potassium: 4.6 mmol/L (ref 3.5–5.1)
SODIUM: 135 mmol/L (ref 135–145)

## 2016-09-20 LAB — POCT I-STAT, CHEM 8
BUN: 19 mg/dL (ref 6–20)
Calcium, Ion: 1.13 mmol/L — ABNORMAL LOW (ref 1.15–1.40)
Chloride: 95 mmol/L — ABNORMAL LOW (ref 101–111)
Creatinine, Ser: 1.5 mg/dL — ABNORMAL HIGH (ref 0.61–1.24)
Glucose, Bld: 150 mg/dL — ABNORMAL HIGH (ref 65–99)
HEMATOCRIT: 25 % — AB (ref 39.0–52.0)
Hemoglobin: 8.5 g/dL — ABNORMAL LOW (ref 13.0–17.0)
Potassium: 4.4 mmol/L (ref 3.5–5.1)
SODIUM: 132 mmol/L — AB (ref 135–145)
TCO2: 26 mmol/L (ref 0–100)

## 2016-09-20 LAB — MAGNESIUM
MAGNESIUM: 2.1 mg/dL (ref 1.7–2.4)
Magnesium: 2.3 mg/dL (ref 1.7–2.4)

## 2016-09-20 LAB — CREATININE, SERUM
Creatinine, Ser: 1.59 mg/dL — ABNORMAL HIGH (ref 0.61–1.24)
GFR calc Af Amer: 49 mL/min — ABNORMAL LOW (ref >60)
GFR calc non Af Amer: 42 mL/min — ABNORMAL LOW (ref >60)

## 2016-09-20 MED ORDER — FUROSEMIDE 10 MG/ML IJ SOLN
40.0000 mg | Freq: Once | INTRAMUSCULAR | Status: AC
  Administered 2016-09-20: 40 mg via INTRAVENOUS
  Filled 2016-09-20: qty 4

## 2016-09-20 MED ORDER — INSULIN ASPART 100 UNIT/ML ~~LOC~~ SOLN
0.0000 [IU] | SUBCUTANEOUS | Status: DC
  Administered 2016-09-20 (×2): 2 [IU] via SUBCUTANEOUS

## 2016-09-20 MED ORDER — ISOSORBIDE MONONITRATE ER 30 MG PO TB24
15.0000 mg | ORAL_TABLET | Freq: Every day | ORAL | Status: DC
  Administered 2016-09-20 – 2016-09-22 (×3): 15 mg via ORAL
  Filled 2016-09-20 (×3): qty 1

## 2016-09-20 MED ORDER — INSULIN ASPART 100 UNIT/ML ~~LOC~~ SOLN: 0.0000 [IU] | SUBCUTANEOUS | Status: DC

## 2016-09-20 MED ORDER — ENOXAPARIN SODIUM 40 MG/0.4ML ~~LOC~~ SOLN
40.0000 mg | Freq: Every day | SUBCUTANEOUS | Status: DC
  Administered 2016-09-20 – 2016-09-23 (×4): 40 mg via SUBCUTANEOUS
  Filled 2016-09-20 (×4): qty 0.4

## 2016-09-20 MED FILL — Electrolyte-R (PH 7.4) Solution: INTRAVENOUS | Qty: 4000 | Status: AC

## 2016-09-20 MED FILL — Heparin Sodium (Porcine) Inj 1000 Unit/ML: INTRAMUSCULAR | Qty: 20 | Status: AC

## 2016-09-20 MED FILL — Sodium Bicarbonate IV Soln 8.4%: INTRAVENOUS | Qty: 50 | Status: AC

## 2016-09-20 MED FILL — Mannitol IV Soln 20%: INTRAVENOUS | Qty: 500 | Status: AC

## 2016-09-20 MED FILL — Lidocaine HCl IV Inj 20 MG/ML: INTRAVENOUS | Qty: 10 | Status: AC

## 2016-09-20 MED FILL — Sodium Chloride IV Soln 0.9%: INTRAVENOUS | Qty: 2000 | Status: AC

## 2016-09-20 NOTE — Progress Notes (Signed)
CT Surgery PM  Patient examined and record reviewed.Hemodynamics stable,labs satisfactory.Patient had stable day.Continue current care. [name] 09/20/2016

## 2016-09-20 NOTE — Care Management Note (Signed)
Case Management Note Previous CM note initiated by Bethena Roys, RN 09/14/2016, 10:41 AM   Patient Details  Name: [name] MRN: [id number] Date of Birth: [birth year]  Subjective/Objective:   Pt presented for Chest Pain-Nstemi. Plan for cardiac cath 09-14-16. Pt is from home with wife and plans to return home once stable. Pt has DME cane and RW, however he does not use at this time. Pt utilizes the [facility] for medications. All cardiac medications are free and all other medications range from $8.00. Pt's PCP is at the [facility] [phone] Cleophas Dunker. Office asked to please fax information H&P / D/C Summary to  [phone] once stable for d/c.            Action/Plan: Orlean Bradford is aware that pt is hospitalized. CM did leave information with Colletta Maryland. CM will continue to monitor.    Expected Discharge Date:  09/17/16               Expected Discharge Plan:  Home/Self Care  In-House Referral:  NA  Discharge planning Services  CM Consult  Post Acute Care Choice:  NA Choice offered to:  NA  DME Arranged:  N/A DME Agency:  NA  HH Arranged:  NA HH Agency:  NA  Status of Service:     If discussed at [place] of Stay Meetings, dates discussed:    Additional Comments:  09/20/16- 1045- Earlie Arciga RN, CM- Pt s/p redo CABGx2 on 09/19/16- is on 2H post op- plan to d/c tubes/lines today- CM to follow for post op progress and d/c needs.   Dahlia Client [place], RN 09/20/2016, 10:47 AM [phone]

## 2016-09-20 NOTE — Progress Notes (Signed)
1 Day Post-Op Procedure(s) (LRB): REDO CORONARY ARTERY BYPASS GRAFTING (CABG) x two, using right internal mammary artery and left radial artery (N/A) TRANSESOPHAGEAL ECHOCARDIOGRAM (TEE) (N/A) RADIAL ARTERY HARVEST (Left) Subjective:  Sore but otherwise ok  Objective: Vital signs in last 24 hours: Temp:  [96.6 F (35.9 C)-98.6 F (37 C)] 98.6 F (37 C) (05/08 0700) Pulse Rate:  [74-96] 74 (05/08 0700) Cardiac Rhythm: Normal sinus rhythm (05/08 0700) Resp:  [12-29] 19 (05/08 0700) BP: (101-154)/(51-63) 142/53 (05/08 0700) SpO2:  [92 %-100 %] 96 % (05/08 0700) Arterial Line BP: (90-159)/(41-85) 157/47 (05/08 0700) FiO2 (%):  [40 %-50 %] 40 % (05/07 2040) Weight:  [102.2 kg (225 lb 5 oz)] 102.2 kg (225 lb 5 oz) (05/08 0430)  Hemodynamic parameters for last 24 hours: PAP: (18-39)/(8-18) 24/9 CO:  [4.7 L/min-7.6 L/min] 7.6 L/min CI:  [2.2 L/min/m2-3.6 L/min/m2] 3.6 L/min/m2  Intake/Output from previous day: 05/07 0701 - 05/08 0700 In: 3879.4 [I.V.:3309.4; NG/GT:30; IV Piggyback:540] Out: 6578 [Urine:1540; Emesis/NG output:100; Blood:2115; Chest Tube:380] Intake/Output this shift: No intake/output data recorded.  General appearance: alert and cooperative Neurologic: intact Heart: regular rate and rhythm, S1, S2 normal, rub from tubes Lungs: clear to auscultation bilaterally Extremities: edema mild, left hand warm and neurovascularly intact Wound: dressings dry  Lab Results:  Recent Labs  09/19/16 2104 09/20/16 0247  WBC 7.9 8.7  HGB 9.2* 9.2*  HCT 27.6* 27.4*  PLT 130* 137*   BMET:  Recent Labs  09/19/16 0459  09/19/16 2103 09/19/16 2104 09/20/16 0247  NA 138  < > 136  --  135  K 3.5  < > 4.8  --  4.6  CL 101  < > 101  --  104  CO2 26  --   --   --  25  GLUCOSE 93  < > 152*  --  114*  BUN 17  < > 16  --  15  CREATININE 1.32*  < > 1.20 1.20 1.21  CALCIUM 8.8*  --   --   --  7.7*  < > = values in this interval not displayed.  PT/INR:  Recent Labs  09/19/16 1540  LABPROT 18.9*  INR 1.56   ABG    Component Value Date/Time   PHART 7.364 09/19/2016 2235   HCO3 23.9 09/19/2016 2235   TCO2 25 09/19/2016 2235   ACIDBASEDEF 1.0 09/19/2016 2235   O2SAT 95.0 09/19/2016 2235   CBG (last 3)   Recent Labs  09/20/16 0154 09/20/16 0258 09/20/16 0400  GLUCAP 109* 113* 89   CXR: clear  ECG: AV paced  Assessment/Plan: S/P Procedure(s) (LRB): REDO CORONARY ARTERY BYPASS GRAFTING (CABG) x two, using right internal mammary artery and left radial artery (N/A) TRANSESOPHAGEAL ECHOCARDIOGRAM (TEE) (N/A) RADIAL ARTERY HARVEST (Left)  Hemodynamically stable in sinus rhythm Mobilize Diuresis Diabetes control: preop Hgb A1c 5.8. Will continue SSI today but will not need Levemir. d/c tubes/lines Continue foley due to diuresing patient and patient in ICU See progression orders   LOS: 6 days    [name] 09/20/2016

## 2016-09-21 ENCOUNTER — Inpatient Hospital Stay (HOSPITAL_COMMUNITY): Payer: Medicare Other

## 2016-09-21 LAB — GLUCOSE, CAPILLARY
GLUCOSE-CAPILLARY: 109 mg/dL — AB (ref 65–99)
GLUCOSE-CAPILLARY: 95 mg/dL (ref 65–99)
GLUCOSE-CAPILLARY: 98 mg/dL (ref 65–99)
GLUCOSE-CAPILLARY: 114 mg/dL — AB (ref 65–99)
Glucose-Capillary: 107 mg/dL — ABNORMAL HIGH (ref 65–99)
Glucose-Capillary: 99 mg/dL (ref 65–99)

## 2016-09-21 LAB — CBC
HEMATOCRIT: 24.8 % — AB (ref 39.0–52.0)
Hemoglobin: 8.3 g/dL — ABNORMAL LOW (ref 13.0–17.0)
MCH: 28.6 pg (ref 26.0–34.0)
MCHC: 33.5 g/dL (ref 30.0–36.0)
MCV: 85.5 fL (ref 78.0–100.0)
PLATELETS: 130 10*3/uL — AB (ref 150–400)
RBC: 2.9 MIL/uL — AB (ref 4.22–5.81)
RDW: 14.7 % (ref 11.5–15.5)
WBC: 7.8 10*3/uL (ref 4.0–10.5)

## 2016-09-21 LAB — BASIC METABOLIC PANEL
Anion gap: 6 (ref 5–15)
BUN: 20 mg/dL (ref 6–20)
CHLORIDE: 97 mmol/L — AB (ref 101–111)
CO2: 27 mmol/L (ref 22–32)
CREATININE: 1.51 mg/dL — AB (ref 0.61–1.24)
Calcium: 7.6 mg/dL — ABNORMAL LOW (ref 8.9–10.3)
GFR calc Af Amer: 52 mL/min — ABNORMAL LOW (ref >60)
GFR calc non Af Amer: 45 mL/min — ABNORMAL LOW (ref >60)
Glucose, Bld: 109 mg/dL — ABNORMAL HIGH (ref 65–99)
POTASSIUM: 4.1 mmol/L (ref 3.5–5.1)
Sodium: 130 mmol/L — ABNORMAL LOW (ref 135–145)

## 2016-09-21 MED ORDER — ACETAMINOPHEN 325 MG PO TABS: 650.0000 mg | ORAL_TABLET | Freq: Four times a day (QID) | ORAL | Status: DC | PRN

## 2016-09-21 MED ORDER — CLOPIDOGREL BISULFATE 75 MG PO TABS
75.0000 mg | ORAL_TABLET | Freq: Every day | ORAL | Status: DC
  Administered 2016-09-22 – 2016-09-24 (×3): 75 mg via ORAL
  Filled 2016-09-21 (×3): qty 1

## 2016-09-21 MED ORDER — MOVING RIGHT ALONG BOOK
Freq: Once | Status: AC
  Administered 2016-09-21: 20:00:00
  Filled 2016-09-21: qty 1

## 2016-09-21 MED ORDER — ASPIRIN EC 81 MG PO TBEC
81.0000 mg | DELAYED_RELEASE_TABLET | Freq: Every day | ORAL | Status: DC
  Administered 2016-09-21 – 2016-09-24 (×4): 81 mg via ORAL
  Filled 2016-09-21 (×4): qty 1

## 2016-09-21 MED ORDER — ONDANSETRON HCL 4 MG/2ML IJ SOLN: 4.0000 mg | Freq: Four times a day (QID) | INTRAMUSCULAR | Status: DC | PRN

## 2016-09-21 MED ORDER — ONDANSETRON HCL 4 MG PO TABS: 4.0000 mg | ORAL_TABLET | Freq: Four times a day (QID) | ORAL | Status: DC | PRN

## 2016-09-21 MED ORDER — PANTOPRAZOLE SODIUM 40 MG PO TBEC
40.0000 mg | DELAYED_RELEASE_TABLET | Freq: Every day | ORAL | Status: DC
  Administered 2016-09-22 – 2016-09-24 (×3): 40 mg via ORAL
  Filled 2016-09-21 (×3): qty 1

## 2016-09-21 MED ORDER — DOCUSATE SODIUM 100 MG PO CAPS
200.0000 mg | ORAL_CAPSULE | Freq: Every day | ORAL | Status: DC
  Administered 2016-09-21 – 2016-09-23 (×3): 200 mg via ORAL
  Filled 2016-09-21 (×4): qty 2

## 2016-09-21 MED ORDER — SODIUM CHLORIDE 0.9 % IV SOLN: 250.0000 mL | INTRAVENOUS | Status: DC | PRN

## 2016-09-21 MED ORDER — METOLAZONE 5 MG PO TABS
10.0000 mg | ORAL_TABLET | Freq: Once | ORAL | Status: AC
  Administered 2016-09-21: 10 mg via ORAL
  Filled 2016-09-21: qty 2

## 2016-09-21 MED ORDER — FUROSEMIDE 40 MG PO TABS: 40.0000 mg | ORAL_TABLET | Freq: Every day | ORAL | Status: DC

## 2016-09-21 MED ORDER — AMIODARONE HCL 200 MG PO TABS
200.0000 mg | ORAL_TABLET | Freq: Two times a day (BID) | ORAL | Status: DC
  Administered 2016-09-21 – 2016-09-24 (×7): 200 mg via ORAL
  Filled 2016-09-21 (×7): qty 1

## 2016-09-21 MED ORDER — POTASSIUM CHLORIDE CRYS ER 20 MEQ PO TBCR
40.0000 meq | EXTENDED_RELEASE_TABLET | Freq: Once | ORAL | Status: AC
  Administered 2016-09-21: 40 meq via ORAL
  Filled 2016-09-21: qty 2

## 2016-09-21 MED ORDER — BISACODYL 10 MG RE SUPP: 10.0000 mg | Freq: Every day | RECTAL | Status: DC | PRN

## 2016-09-21 MED ORDER — ATORVASTATIN CALCIUM 40 MG PO TABS
40.0000 mg | ORAL_TABLET | Freq: Every day | ORAL | Status: DC
  Administered 2016-09-21 – 2016-09-23 (×3): 40 mg via ORAL
  Filled 2016-09-21 (×3): qty 1

## 2016-09-21 MED ORDER — POTASSIUM CHLORIDE CRYS ER 20 MEQ PO TBCR
20.0000 meq | EXTENDED_RELEASE_TABLET | Freq: Two times a day (BID) | ORAL | Status: DC
Start: 2016-09-22 — End: 2016-09-24
  Administered 2016-09-22 – 2016-09-23 (×4): 20 meq via ORAL
  Filled 2016-09-21 (×4): qty 1

## 2016-09-21 MED ORDER — BISACODYL 5 MG PO TBEC
10.0000 mg | DELAYED_RELEASE_TABLET | Freq: Every day | ORAL | Status: DC | PRN
  Administered 2016-09-23: 10 mg via ORAL
  Filled 2016-09-21: qty 2

## 2016-09-21 MED ORDER — FERROUS GLUCONATE 324 (38 FE) MG PO TABS
324.0000 mg | ORAL_TABLET | Freq: Two times a day (BID) | ORAL | Status: DC
  Administered 2016-09-22 – 2016-09-24 (×5): 324 mg via ORAL
  Filled 2016-09-21 (×6): qty 1

## 2016-09-21 MED ORDER — SODIUM CHLORIDE 0.9% FLUSH: 3.0000 mL | INTRAVENOUS | Status: DC | PRN

## 2016-09-21 MED ORDER — FUROSEMIDE 10 MG/ML IJ SOLN
40.0000 mg | Freq: Once | INTRAMUSCULAR | Status: AC
  Administered 2016-09-21: 40 mg via INTRAVENOUS
  Filled 2016-09-21: qty 4

## 2016-09-21 MED ORDER — SODIUM CHLORIDE 0.9% FLUSH
3.0000 mL | Freq: Two times a day (BID) | INTRAVENOUS | Status: DC
  Administered 2016-09-21 – 2016-09-24 (×6): 3 mL via INTRAVENOUS

## 2016-09-21 MED ORDER — TRAMADOL HCL 50 MG PO TABS
50.0000 mg | ORAL_TABLET | ORAL | Status: DC | PRN
  Administered 2016-09-22 – 2016-09-23 (×3): 100 mg via ORAL
  Filled 2016-09-21 (×3): qty 2

## 2016-09-21 MED ORDER — OXYCODONE HCL 5 MG PO TABS: 5.0000 mg | ORAL_TABLET | ORAL | Status: DC | PRN

## 2016-09-21 NOTE — Progress Notes (Signed)
2 Days Post-Op Procedure(s) (LRB): REDO CORONARY ARTERY BYPASS GRAFTING (CABG) x two, using right internal mammary artery and left radial artery (N/A) TRANSESOPHAGEAL ECHOCARDIOGRAM (TEE) (N/A) RADIAL ARTERY HARVEST (Left) Subjective:  No specific complaints. Passing flatus but no BM yet  ambulating  Objective: Vital signs in last 24 hours: Temp:  [97.4 F (36.3 C)-98.6 F (37 C)] 97.8 F (36.6 C) (05/09 0400) Pulse Rate:  [52-75] 61 (05/09 0700) Cardiac Rhythm: Normal sinus rhythm (05/09 0400) Resp:  [14-27] 19 (05/09 0700) BP: (102-152)/(42-58) 120/45 (05/09 0700) SpO2:  [92 %-100 %] 96 % (05/09 0700) Arterial Line BP: (162)/(50) 162/50 (05/08 0800) Weight:  [102.6 kg (226 lb 3.1 oz)] 102.6 kg (226 lb 3.1 oz) (05/09 0300)  Hemodynamic parameters for last 24 hours: PAP: (29)/(14) 29/14 CO:  [6.9 L/min] 6.9 L/min CI:  [3.3 L/min/m2] 3.3 L/min/m2  Intake/Output from previous day: 05/08 0701 - 05/09 0700 In: 1348 [P.O.:600; I.V.:698; IV Piggyback:50] Out: 9892 [Urine:1180; Chest Tube:10] Intake/Output this shift: No intake/output data recorded.  General appearance: alert Neurologic: intact Heart: regular rate and rhythm, S1, S2 normal, no murmur, click, rub or gallop Lungs: clear to auscultation bilaterally Extremities: edema mild Wound: dressings dry  Lab Results:  Recent Labs  09/20/16 1623 09/20/16 1627 09/21/16 0250  WBC 8.4  --  7.8  HGB 8.3* 8.5* 8.3*  HCT 24.8* 25.0* 24.8*  PLT 132*  --  130*   BMET:  Recent Labs  09/20/16 0247  09/20/16 1627 09/21/16 0250  NA 135  --  132* 130*  K 4.6  --  4.4 4.1  CL 104  --  95* 97*  CO2 25  --   --  27  GLUCOSE 114*  --  150* 109*  BUN 15  --  19 20  CREATININE 1.21  < > 1.50* 1.51*  CALCIUM 7.7*  --   --  7.6*  < > = values in this interval not displayed.  PT/INR:  Recent Labs  09/19/16 1540  LABPROT 18.9*  INR 1.56   ABG    Component Value Date/Time   PHART 7.364 09/19/2016 2235   HCO3 23.9  09/19/2016 2235   TCO2 26 09/20/2016 1627   ACIDBASEDEF 1.0 09/19/2016 2235   O2SAT 95.0 09/19/2016 2235   CBG (last 3)   Recent Labs  09/20/16 2017 09/20/16 2354 09/21/16 0400  GLUCAP 127* 99 109*    CLINICAL DATA:  Status post coronary bypass grafting  EXAM: PORTABLE CHEST 1 VIEW  COMPARISON:  09/20/2016  FINDINGS: Cardiac shadow is mildly enlarged. Postsurgical changes are again seen. Previously seen drainage tubes have been removed in the interval. Right jugular central line is again identified and stable. No pneumothorax is seen. The overall inspiratory effort is poor with mild basilar atelectasis. No bony abnormality is seen.  IMPRESSION: Decreased inspiratory effort with basilar atelectasis.  No other acute abnormality is noted.   Electronically Signed   By: Inez Catalina M.D.   On: 09/21/2016 07:35   Assessment/Plan: S/P Procedure(s) (LRB): REDO CORONARY ARTERY BYPASS GRAFTING (CABG) x two, using right internal mammary artery and left radial artery (N/A) TRANSESOPHAGEAL ECHOCARDIOGRAM (TEE) (N/A) RADIAL ARTERY HARVEST (Left)  POD 2  He has been hemodynamically stable in sinus rhythm 67. Will switch to oral amiodarone 200 bid and hold off on beta blocker for now. He had periop SVT.  Volume excess: wt is 16 lbs over preop. Continue diuresis. Will give a dose of metolazone with lasix this am.  Stage III CKD:  creat stable at 1.51.  Expected postop acute blood loss anemia: stable. Will start iron and observe.  He has severe multi-vessel CAD with diffusely diseased vessels. Will put on ASA 81 and Plavix.  Smoking and COPD: continue IS  Transfer to 2W and mobilize.   LOS: 7 days    [name] 09/21/2016

## 2016-09-21 NOTE — Progress Notes (Signed)
      [address]       [place] [address]             [phone]      POD # 2 redo CABG  Sitting up in chair, no complaints  BP (!) 119/45   Pulse 63   Temp 98.1 F (36.7 C) (Oral)   Resp 16   Ht 5\' 9"  (1.753 m)   Wt 226 lb 3.1 oz (102.6 kg)   SpO2 (!) 89%   BMI 33.40 kg/m    Intake/Output Summary (Last 24 hours) at 09/21/16 1706 Last data filed at 09/21/16 1650  Gross per 24 hour  Intake           1273.8 ml  Output             1800 ml  Net           -526.2 ml    Doing well   Awaiting transfer to [address] [place]. Roxan Hockey, MD [facility] [phone]

## 2016-09-22 ENCOUNTER — Inpatient Hospital Stay (HOSPITAL_COMMUNITY): Payer: Medicare Other

## 2016-09-22 LAB — TYPE AND SCREEN
ABO/RH(D): A POS
Antibody Screen: NEGATIVE
UNIT DIVISION: 0
Unit division: 0

## 2016-09-22 LAB — GLUCOSE, CAPILLARY
GLUCOSE-CAPILLARY: 108 mg/dL — AB (ref 65–99)
GLUCOSE-CAPILLARY: 99 mg/dL (ref 65–99)
Glucose-Capillary: 92 mg/dL (ref 65–99)
Glucose-Capillary: 102 mg/dL — ABNORMAL HIGH (ref 65–99)

## 2016-09-22 LAB — BASIC METABOLIC PANEL
ANION GAP: 8 (ref 5–15)
BUN: 25 mg/dL — ABNORMAL HIGH (ref 6–20)
CHLORIDE: 99 mmol/L — AB (ref 101–111)
CO2: 26 mmol/L (ref 22–32)
Calcium: 8.2 mg/dL — ABNORMAL LOW (ref 8.9–10.3)
Creatinine, Ser: 1.58 mg/dL — ABNORMAL HIGH (ref 0.61–1.24)
GFR calc Af Amer: 49 mL/min — ABNORMAL LOW (ref >60)
GFR calc non Af Amer: 43 mL/min — ABNORMAL LOW (ref >60)
GLUCOSE: 92 mg/dL (ref 65–99)
POTASSIUM: 4.3 mmol/L (ref 3.5–5.1)
Sodium: 133 mmol/L — ABNORMAL LOW (ref 135–145)

## 2016-09-22 LAB — CBC
HEMATOCRIT: 24.9 % — AB (ref 39.0–52.0)
Hemoglobin: 8.3 g/dL — ABNORMAL LOW (ref 13.0–17.0)
MCH: 28.3 pg (ref 26.0–34.0)
MCHC: 33.3 g/dL (ref 30.0–36.0)
MCV: 85 fL (ref 78.0–100.0)
Platelets: 151 10*3/uL (ref 150–400)
RBC: 2.93 MIL/uL — AB (ref 4.22–5.81)
RDW: 14.5 % (ref 11.5–15.5)
WBC: 6 10*3/uL (ref 4.0–10.5)

## 2016-09-22 LAB — BPAM RBC
BLOOD PRODUCT EXPIRATION DATE: 201805152359
Blood Product Expiration Date: 201805152359
ISSUE DATE / TIME: 201805070720
ISSUE DATE / TIME: 201805070720
Unit Type and Rh: 6200
Unit Type and Rh: 6200

## 2016-09-22 MED ORDER — FUROSEMIDE 10 MG/ML IJ SOLN: 40.0000 mg | Freq: Once | INTRAMUSCULAR | Status: DC

## 2016-09-22 MED ORDER — FUROSEMIDE 40 MG PO TABS: 40.0000 mg | ORAL_TABLET | Freq: Every day | ORAL | Status: DC

## 2016-09-22 MED ORDER — LACTULOSE 10 GM/15ML PO SOLN
20.0000 g | Freq: Once | ORAL | Status: AC
  Administered 2016-09-22: 20 g via ORAL
  Filled 2016-09-22: qty 30

## 2016-09-22 MED ORDER — FUROSEMIDE 40 MG PO TABS
40.0000 mg | ORAL_TABLET | Freq: Every day | ORAL | Status: AC
  Administered 2016-09-22 – 2016-09-24 (×3): 40 mg via ORAL
  Filled 2016-09-22 (×3): qty 1

## 2016-09-22 MED ORDER — METOLAZONE 5 MG PO TABS
10.0000 mg | ORAL_TABLET | Freq: Once | ORAL | Status: AC
  Administered 2016-09-22: 10 mg via ORAL
  Filled 2016-09-22: qty 2

## 2016-09-22 NOTE — Progress Notes (Addendum)
      [address]       [place] [address]             [phone]        3 Days Post-Op Procedure(s) (LRB): REDO CORONARY ARTERY BYPASS GRAFTING (CABG) x two, using right internal mammary artery and left radial artery (N/A) TRANSESOPHAGEAL ECHOCARDIOGRAM (TEE) (N/A) RADIAL ARTERY HARVEST (Left)  Subjective: Patient passing flatus but no bowel movement yet. Has minor numbness left thumb (radial artery harvest).  Objective: Vital signs in last 24 hours: Temp:  [97.8 F (36.6 C)-98.5 F (36.9 C)] 97.9 F (36.6 C) (05/10 0631) Pulse Rate:  [62-79] 75 (05/10 0631) Cardiac Rhythm: Normal sinus rhythm (05/09 1930) Resp:  [16-24] 18 (05/10 0631) BP: (117-145)/(44-75) 120/44 (05/10 0631) SpO2:  [89 %-97 %] 93 % (05/10 0631) Weight:  [124 kg (273 lb 5.9 oz)-124.6 kg (274 lb 12.8 oz)] 124 kg (273 lb 5.9 oz) (05/10 0631)  Pre op weight 95.3 kg Current Weight  09/22/16 124 kg (273 lb 5.9 oz)      Intake/Output from previous day: 05/09 0701 - 05/10 0700 In: 850 [P.O.:840; I.V.:10] Out: 2215 [Urine:2215]   Physical Exam:  Cardiovascular: RRR Pulmonary: Slightly diminished at bases Abdomen: Soft, non tender, bowel sounds present. Extremities: Bilateral lower extremity edema. Wounds: Dressings removed and all wounds are clean and dry.  No erythema or signs of infection.  Lab Results: CBC: Recent Labs  09/21/16 0250 09/22/16 0600  WBC 7.8 6.0  HGB 8.3* 8.3*  HCT 24.8* 24.9*  PLT 130* 151   BMET:  Recent Labs  09/21/16 0250 09/22/16 0600  NA 130* 133*  K 4.1 4.3  CL 97* 99*  CO2 27 26  GLUCOSE 109* 92  BUN 20 25*  CREATININE 1.51* 1.58*  CALCIUM 7.6* 8.2*    PT/INR:  Lab Results  Component Value Date   INR 1.56 09/19/2016   INR 1.12 09/14/2016   INR 1.08 01/31/2014   ABG:  INR: Will add last result for INR, ABG once components are confirmed Will add last 4 CBG results once components are confirmed  Assessment/Plan:  1. CV - Had  peri op SVT. SR in the 70's. On Amiodarone 200 mg bid, Plavix 75 mg daily, Imdur 15 mg daily. Not on BB secondary to previous bradycardia. 2.  Pulmonary - History COPD. On room air this am. Encourage incentive spirometer. Will order PA/LAT CXR as has not had 2 view post op. 3. Volume Overload - On Lasix 40 mg daily. Will give Metolazone followed by oral Lasix again today as had good diuresis with it yesterday 4.  Acute blood loss anemia - H and H stable at 8.3 and 24.9. Continue Fergon. 5. Stage III CKD-creatinine slightly increase from 1.51 to 1.58 6. Remove EPW 7. LOC constipation 8. Likely home in next few days after diuresed more  Billal Rollo MPA-C 09/22/2016,7:23 AM

## 2016-09-22 NOTE — Discharge Instructions (Signed)
Coronary Artery Bypass Grafting, Care After ° °This sheet gives you information about how to care for yourself after your procedure. Your health care provider may also give you more specific instructions. If you have problems or questions, contact your health care provider. °What can I expect after the procedure? °After the procedure, it is common to have: °· Nausea and a lack of appetite. °· Constipation. °· Weakness and fatigue. °· Depression or irritability. °· Pain or discomfort in your incision areas. °Follow these instructions at home: °Medicines  °· Take over-the-counter and prescription medicines only as told by your health care provider. Do not stop taking medicines or start any new medicines without approval from your health care provider. °· If you were prescribed an antibiotic medicine, take it as told by your health care provider. Do not stop taking the antibiotic even if you start to feel better. °· Do not drive or use heavy machinery while taking prescription pain medicine. °Incision care  °· Follow instructions from your health care provider about how to take care of your incisions. Make sure you: °¨ Wash your hands with soap and water before you change your bandage (dressing). If soap and water are not available, use hand sanitizer. °¨ Change your dressing as told by your health care provider. °¨ Leave stitches (sutures), skin glue, or adhesive strips in place. These skin closures may need to stay in place for 2 weeks or longer. If adhesive strip edges start to loosen and curl up, you may trim the loose edges. Do not remove adhesive strips completely unless your health care provider tells you to do that. °· Keep incision areas clean, dry, and protected. °· Check your incision areas every day for signs of infection. Check for: °¨ More redness, swelling, or pain. °¨ More fluid or blood. °¨ Warmth. °¨ Pus or a bad smell. °· If incisions were made in your legs: °¨ Avoid crossing your legs. °¨ Avoid  sitting for long periods of time. Change positions every 30 minutes. °¨ Raise (elevate) your legs when you are sitting. °Bathing  °· Do not take baths, swim, or use a hot tub until your health care provider approves. °· Only take sponge baths. Pat the incisions dry. Do not rub incisions with a washcloth or towel. °· Ask your health care provider when you can shower. °Eating and drinking  °· Eat foods that are high in fiber, such as raw fruits and vegetables, whole grains, beans, and nuts. Meats should be lean cut. Avoid canned, processed, and fried foods. This can help prevent constipation and is a recommended part of a heart-healthy diet. °· Drink enough fluid to keep your urine clear or pale yellow. °· Limit alcohol intake to no more than 1 drink a day for nonpregnant women and 2 drinks a day for men. One drink equals 12 oz of beer, 5 oz of wine, or 1½ oz of hard liquor. °Activity  °· Rest and limit your activity as told by your health care provider. You may be instructed to: °¨ Stop any activity right away if you have chest pain, shortness of breath, irregular heartbeats, or dizziness. Get help right away if you have any of these symptoms. °¨ Move around frequently for short periods or take short walks as directed by your health care provider. Gradually increase your activities. You may need physical therapy or cardiac rehabilitation to help strengthen your muscles and build your endurance. °¨ Avoid lifting, pushing, or pulling anything that is heavier than 10   lb (4.5 kg) for at least 6 weeks or as told by your health care provider. °· Do not drive until your health care provider approves. °· Ask your health care provider when you may return to work. °· Ask your health care provider when you may resume sexual activity. °General instructions  °· Do not use any products that contain nicotine or tobacco, such as cigarettes and e-cigarettes. If you need help quitting, ask your health care provider. °· Take 2-3 deep  breaths every few hours during the day, while you recover. This helps expand your lungs and prevent complications like pneumonia after surgery. °· If you were given a device called an incentive spirometer, use it several times a day to practice deep breathing. Support your chest with a pillow or your arms when you take deep breaths or cough. °· Wear compression stockings as told by your health care provider. These stockings help to prevent blood clots and reduce swelling in your legs. °· Weigh yourself every day. This helps identify if your body is holding (retaining) fluid that may make your heart and lungs work harder. °· Keep all follow-up visits as told by your health care provider. This is important. °Contact a health care provider if: °· You have more redness, swelling, or pain around any incision. °· You have more fluid or blood coming from any incision. °· Any incision feels warm to the touch. °· You have pus or a bad smell coming from any incision °· You have a fever. °· You have swelling in your ankles or legs. °· You have pain in your legs. °· You gain 2 lb (0.9 kg) or more a day. °· You are nauseous or you vomit. °· You have diarrhea. °Get help right away if: °· You have chest pain that spreads to your jaw or arms. °· You are short of breath. °· You have a fast or irregular heartbeat. °· You notice a "clicking" in your breastbone (sternum) when you move. °· You have numbness or weakness in your arms or legs. °· You feel dizzy or light-headed. °Summary °· After the procedure, it is common to have pain or discomfort in the incision areas. °· Do not take baths, swim, or use a hot tub until your health care provider approves. °· Gradually increase your activities. You may need physical therapy or cardiac rehabilitation to help strengthen your muscles and build your endurance. °· Weigh yourself every day. This helps identify if your body is holding (retaining) fluid that may make your heart and lungs work  harder. °This information is not intended to replace advice given to you by your health care provider. Make sure you discuss any questions you have with your health care provider. °Document Released: 11/19/2004 Document Revised: 03/21/2016 Document Reviewed: 03/21/2016 °Elsevier Interactive Patient Education © 2017 [employer] Inc. ° °

## 2016-09-22 NOTE — Discharge Summary (Signed)
Physician Discharge Summary       [address]       [place] [address]             [phone]    Patient ID: [name] MRN: [id number] DOB/AGE: February 23, 1946 71 y.o.  Admit date: 09/14/2016 Discharge date: 09/24/2016  Admission Diagnoses: 1.  NSTEMI (non-ST elevated myocardial infarction) ([place]) 2. History of CAD-s/p CABG x 5 08'  Active Diagnoses:  1. Hypertension 2. Hyperlipidemia 3. COPD (chronic obstructive pulmonary disease) ([place]) 4.  Obstructive sleep apnea 5. Tobacco abuse 6. Hypothyroidism 7. Stroke ([place]) 8. PONV (postoperative nausea and vomiting) 9. GERD (gastroesophageal reflux disease) 10. Cancer (HCC)-bladder 11. Anxiety 12. Arthritis 13. Stage III CKD 14. ABL anemia   Procedure (s):  Procedures   Left Heart Cath and Cors/Grafts Angiography by Dr. Claiborne Billings on 09/14/2016:  Conclusion     The left ventricular ejection fraction is 45-50% by visual estimate.  LV end diastolic pressure is normal.  There is mild left ventricular systolic dysfunction.  Dist RCA lesion, 90 %stenosed.  SVG.  Dist Graft-1 lesion, 95 %stenosed.  Dist Graft-2 lesion, 50 %stenosed.  Prox Graft lesion, 80 %stenosed.  Ost 2nd Mrg lesion, 100 %stenosed.  SVG.  Mid Graft lesion, 100 %stenosed.  Prox Graft lesion, 100 %stenosed.  Seq SVG-.  Origin lesion, 99 %stenosed.  Prox LAD lesion, 100 %stenosed.  Prox Graft to Dist Graft lesion, 25 %stenosed.  Ost LAD to Prox LAD lesion, 80 %stenosed.   Multivessel native CAD with diffuse 80% proximal LAD stenosis and occlusion of the LAD after a small first diagonal vessel; occluded marginal branch arising from the mid AV groove circumflex with an otherwise patent AV groove circumflex extending distally; RCA with 20% mid narrowing normal PDA, but with 90% PLA stenosis.  Patent LIMA graft supplying the mid LAD.  SVG sequentially supplying the diagonal-1 and OM vessel with 99% near ostial graft stenosis  and diffuse luminal irregularity in the body of the graft prior to the initial anastomosis.  SVG supplying the diagonal 2 vessel with diffuse proximal to mid graft stenosis, 95% ulcerated plaque stenosis in the distal third of the graft and 50% eccentric stenosis prior to its anastomosis.  SVG which had previously supplied the PLA and had been stented, is occluded proximally.  Mild-to-moderate LV dysfunction with mid distal anterolateral hypocontractility and an ejection fraction of 45-50%.  RECOMMENDATION: The patient had previously undergone CABG surgery by Dr. Cyndia Bent in 2008.  Dr. Cyndia Bent will be re-consulted for evaluation and consideration of possible redo CABG revascularization surgery.     1. Redo Median Sternotomy 2. Extracorporeal circulation 3.   Redo Coronary artery bypass grafting x 2   Free right internal mammary artery graft to the diagonal  Left radial artery graft to the distal RCA   4.   Harvest of left radial artery by Dr. Cyndia Bent on 09/19/2016.  History of Presenting Illness: The patient is a 71 year old gentleman with a history of hypertension, hyperlipidemia, hypothyroidism, ongoing 1 ppd smoking with COPD, and CAD s/p CABG x 5 by me in 2008. He had a LIMA to the LAD, SVG to D2, sequential SVG to D1 and OM and a SVG to the PL RCA. He subsequently underwent PCI with DES to the RCA vein graft in 2015. He reports that last year he had about a month of frequent substernal chest pain with exertion and thought it was indigestion. He was followed at the [facility] and had  a cardiac workup including a cath. He says that he was told that everything was stable and there was no significant blockages. He was ok until the past 3 weeks when he has had recurrent chest pain. He has been woken up at night by chest pain every night. Then last night he had severe constant chest pain and went to [facility]. This was associated with nausea and diaphoresis. He took NTG and it helped but the pain  recurred quickly and a second NTG did not help so he went to the hospital. His troponin here was 0.33 and repeats were 0.43 and 0.39. He underwent cath today showing severe native 3-vessel CAD. There was a patent LIMA to the LAD. The SVG to the PL RCA is chronically occluded and the PL system is small. The SVG to D1 and OM has a 99% stenosis near the ostium with diffuse disease and slow flow to the OM. The SVG to D2 is diffusely diseased with 95% stenosis in the distal third of the graft and 50% stenosis prior to its anastomosis. The LVEF is 45-50%.   This 71 year old gentleman has severe native 3-vessel CAD with diffusely diseased and severely stenosed vein grafts to the branching D2 and OM with unstable angina and NSTEMI. Dr. Cyndia Bent  reviewed his previous operative note and the D2 was diffusely diseased but the OM was a large vessel. Both of these appear to be small to moderate sized vessels on cath and are smaller than the catheter but look like they may be graftable. The RCA system only has a significant stenosis distally before the PL and the PL, which was grafted before is too small to graft again. Dr. Cyndia Bent used the right saphenous vein for his initial surgery after examining the left saphenous vein and finding it to be small, branching and not suitable. Therefore, he probably does not have any suitable saphenous vein remaining. Dr. Cyndia Bent discussed the need for redo sternotomy, coronary artery bypass grafting surgery using the right internal mammary artery and left radial artery. Potential risks, benefits, and complications were discussed with the patient and he agreed to proceed with surgery. Pre operative duplex carotid US showed left internal carotid artery stenosis 40-59% and minimal right internal carotid artery stenosis. Allen's test was good for use of left radial artery. He underwent the aforementioned surgery on 09/19/2016.  Brief Hospital Course:  The patient was extubated late the evening  of surgery without difficulty. Heremained afebrile and hemodynamically stable. Gordy Councilman, a line, chest tubes, and foley were removed early in the post operative course. He had peri op SVT. He remained in sinus rhythm post op. He was put on oral Amiodarone. Lopressor was not initially started secondary to mild bradycardia. He was volume over loaded and diuresed with Lasix and Metolazone. He has CKD (stage III) so his creatinine was carefully  monitored. His last creatinine was stable at 1.63. He had ABL anemia. He did not require a post op transfusion. Last H and H was stable at 8.3 and 24.9 . He was weaned off the insulin drip.The patient's HGA1C pre op was  5.8.  He is likely pre diabetic and will need to follow up with his medical doctor after discharge. The patient was felt surgically stable for transfer from the ICU to [facility] for further convalescence on 09/21/2016. He continues to progress with cardiac rehab. He was ambulating on room air. He has been tolerating a diet and has had a bowel movement. Epicardial pacing wires  were removed on 09/22/2016. He was no longer bradycardic so he was restarted on Toprol XL 25 mg daily. Also, his Imdur was increased to 30 mg daily for better BP control. Chest tube sutures will be removed in the office after discharge. The patient is felt surgically stable for discharge today.                                                                                                                                                                                                                                                                                                         Latest Vital Signs: Blood pressure (!) 139/44, pulse 65, temperature 98 F (36.7 C), temperature source Oral, resp. rate (!) 24, height 5\' 9"  (6.213 m), weight 99.1 kg (218 lb 6.4 oz), SpO2 94 %.  Physical Exam: Cardiovascular: RRR Pulmonary: Slightly diminished at bases Abdomen: Soft, non tender, bowel  sounds present. Extremities: Bilateral lower extremity edema. Wounds: Dressings removed and all wounds are clean and dry.  No erythema or signs of infection.  Discharge Condition: Stable and discharged to home.  Recent laboratory studies:  Lab Results  Component Value Date   WBC 6.0 09/22/2016   HGB 8.3 (L) 09/22/2016   HCT 24.9 (L) 09/22/2016   MCV 85.0 09/22/2016   PLT 151 09/22/2016   Lab Results  Component Value Date   NA 133 (L) 09/24/2016   K 4.8 09/24/2016   CL 98 (L) 09/24/2016   CO2 27 09/24/2016   CREATININE 1.63 (H) 09/24/2016   GLUCOSE 92 09/24/2016    Diagnostic Studies: Dg Chest 2 View  Result Date: 09/22/2016 CLINICAL DATA:  Pleural effusion, history coronary artery disease post MI, hypertension, COPD, smoker EXAM: CHEST  2 VIEW COMPARISON:  09/21/2016 FINDINGS: Mild enlargement cardiac silhouette post CABG. Atherosclerotic calcification aorta. Mediastinal contours and pulmonary vascularity normal. Bibasilar atelectasis greater on LEFT. Upper lungs clear. Small BILATERAL pleural effusions blunt the posterior costophrenic angles. No pneumothorax. Scattered endplate spur formation thoracic spine. IMPRESSION: Bibasilar atelectasis and small pleural effusions. Enlargement of cardiac silhouette post CABG.  Electronically Signed   By: Lavonia Dana M.D.   On: 09/22/2016 08:36  Discharge Instructions    Amb Referral to Cardiac Rehabilitation    Complete by:  As directed    Diagnosis:  CABG   CABG X ___:  2     Discharge Medications: Allergies as of 09/24/2016      Reactions   Cardizem Cd [diltiazem Hcl Er Beads] Palpitations   "Makes heart skip"      Medication List    STOP taking these medications   amLODipine 10 MG tablet Commonly known as:  NORVASC   hydrochlorothiazide 25 MG tablet Commonly known as:  HYDRODIURIL   lisinopril 40 MG tablet Commonly known as:  PRINIVIL,ZESTRIL   nitroGLYCERIN 0.4 MG SL tablet Commonly known as:  NITROSTAT     TAKE these  medications   acetaminophen 325 MG tablet Commonly known as:  TYLENOL Take 2 tablets (650 mg total) by mouth every 6 (six) hours as needed for mild pain.   amiodarone 200 MG tablet Commonly known as:  PACERONE Take 1 tablet (200 mg total) by mouth daily.   aspirin 81 MG chewable tablet Chew 1 tablet (81 mg total) by mouth daily.   atorvastatin 40 MG tablet Commonly known as:  LIPITOR Take 1 tablet (40 mg total) by mouth daily. What changed:  medication strength  how much to take   clopidogrel 75 MG tablet Commonly known as:  PLAVIX Take 1 tablet (75 mg total) by mouth daily.   ergocalciferol 50000 units capsule Commonly known as:  VITAMIN D2 Take 50,000 Units by mouth every Sunday.   ferrous gluconate 324 MG tablet Commonly known as:  FERGON Take 1 tablet (324 mg total) by mouth daily with breakfast. For one month then stop.   furosemide 40 MG tablet Commonly known as:  LASIX Take 1 tablet (40 mg total) by mouth daily. For one week then stop.   isosorbide mononitrate 30 MG 24 hr tablet Commonly known as:  IMDUR Take 1 tablet (30 mg total) by mouth daily. For one month then stop. What changed:  how much to take  additional instructions   levothyroxine 25 MCG tablet Commonly known as:  SYNTHROID, LEVOTHROID Take 25 mcg by mouth daily before breakfast.   metoprolol succinate 25 MG 24 hr tablet Commonly known as:  TOPROL-XL Take 25 mg by mouth daily.   omeprazole 20 MG capsule Commonly known as:  PRILOSEC Take 40 mg by mouth daily.   RA FISH OIL 1000 MG Caps Take 2,000 mg by mouth 2 (two) times daily.   traMADol 50 MG tablet Commonly known as:  ULTRAM Take 50 mg by mouth every 4-6 hours PRN moderate to severe pain.      The patient has been discharged on:   1.Beta Blocker:  Yes [  x ]                              No   [   ]                              If No, reason:  2.Ace Inhibitor/ARB: Yes [   ]                                     No  [  x   ]                                     If No, reason:CKD-elevated creatinine  3.Statin:   Yes [  x ]                  No  [   ]                  If No, reason:  4.Ecasa:  Yes  [  x ]                  No   [   ]                  If No, reason:  Follow Up Appointments: Follow-up Information    Arnoldo Lenis, MD Follow up on 10/17/2016.   Specialty:  Cardiology Why:  Appointment time is at 11:00 am Contact information: [place] [address] [phone]        [name], MD Follow up on 10/26/2016.   Specialty:  Cardiothoracic Surgery Why:  PA/LAT CXR to be taken (at [facility] which is in the same building as Dr. Vivi Martens office) on 10/26/2016 at 11:00 am;Appointment time is at 11:30 am Contact information: [place] [address] [phone]        Nurse Follow up on 10/04/2016.   Why:  Appointment is with nurse for chest tube suture removal only. Appointment time is at 10:30 am Contact information: [address] [place] [address]       Cleophas Dunker, MD Follow up.   Specialty:  Nurse Practitioner Why:  Call for a follow up appointment for one month regarding furhter surveillance of HGA1C 5.8 (likely pre diabetic) Contact information: [address] [place] [address] [phone]           Signed: ZIMMERMAN,DONIELLE MPA-C 09/24/2016, 10:40 AM

## 2016-09-22 NOTE — Progress Notes (Signed)
CARDIAC REHAB PHASE I   PRE:  Rate/Rhythm: 75 SR    BP: sitting 159/50    SaO2: 93 RA  MODE:  Ambulation: 450 ft   POST:  Rate/Rhythm: 96 SR    BP: sitting 182/51     SaO2: 96 RA  Pt able to get up and walk, min assist. He is steady but he did get progressively SOB with distance. Sat in recliner and still somewhat SOB, sts increased from this am with walking. SAO2 and HR stable. Encouraged x1 more walk, IS. [phone]   Darrick Meigs CES, ACSM 09/22/2016 1:56 PM

## 2016-09-22 NOTE — Plan of Care (Signed)
Problem: Activity: Goal: Risk for activity intolerance will decrease Outcome: Progressing Encouraged to move more. Patient agreed. Feels better.

## 2016-09-22 NOTE — Progress Notes (Signed)
Epicardial pacing wires removed, per order. Wires ends intact. Vitals stable and being monitored every 15 minutes, per protocol. Sites clean and dry. Pt on bedrest x1 hour. Pt in bed with call light and phone within reach. Will continue to monitor.   Grant Fontana BSN, RN

## 2016-09-23 LAB — BASIC METABOLIC PANEL
Anion gap: 9 (ref 5–15)
BUN: 28 mg/dL — AB (ref 6–20)
CALCIUM: 8.1 mg/dL — AB (ref 8.9–10.3)
CO2: 27 mmol/L (ref 22–32)
CREATININE: 1.64 mg/dL — AB (ref 0.61–1.24)
Chloride: 96 mmol/L — ABNORMAL LOW (ref 101–111)
GFR calc Af Amer: 47 mL/min — ABNORMAL LOW (ref >60)
GFR, EST NON AFRICAN AMERICAN: 41 mL/min — AB (ref >60)
GLUCOSE: 93 mg/dL (ref 65–99)
Potassium: 4.2 mmol/L (ref 3.5–5.1)
Sodium: 132 mmol/L — ABNORMAL LOW (ref 135–145)

## 2016-09-23 LAB — GLUCOSE, CAPILLARY: GLUCOSE-CAPILLARY: 103 mg/dL — AB (ref 65–99)

## 2016-09-23 MED ORDER — ACETAMINOPHEN 325 MG PO TABS: 650.0000 mg | ORAL_TABLET | Freq: Four times a day (QID) | ORAL | Status: AC | PRN

## 2016-09-23 MED ORDER — ISOSORBIDE MONONITRATE ER 30 MG PO TB24: 15.0000 mg | ORAL_TABLET | Freq: Every day | ORAL | 0 refills | Status: DC

## 2016-09-23 MED ORDER — FERROUS GLUCONATE 324 (38 FE) MG PO TABS: 324.0000 mg | ORAL_TABLET | Freq: Every day | ORAL | 0 refills | Status: DC

## 2016-09-23 MED ORDER — METOPROLOL SUCCINATE ER 25 MG PO TB24
25.0000 mg | ORAL_TABLET | Freq: Every day | ORAL | Status: DC
  Administered 2016-09-23 – 2016-09-24 (×2): 25 mg via ORAL
  Filled 2016-09-23 (×2): qty 1

## 2016-09-23 MED ORDER — CLOPIDOGREL BISULFATE 75 MG PO TABS: 75.0000 mg | ORAL_TABLET | Freq: Every day | ORAL | 1 refills | Status: DC

## 2016-09-23 MED ORDER — AMIODARONE HCL 200 MG PO TABS: 200.0000 mg | ORAL_TABLET | Freq: Two times a day (BID) | ORAL | 1 refills | Status: DC

## 2016-09-23 MED ORDER — ISOSORBIDE MONONITRATE ER 30 MG PO TB24
30.0000 mg | ORAL_TABLET | Freq: Every day | ORAL | Status: DC
  Administered 2016-09-23 – 2016-09-24 (×2): 30 mg via ORAL
  Filled 2016-09-23 (×2): qty 1

## 2016-09-23 MED ORDER — ATORVASTATIN CALCIUM 40 MG PO TABS: 40.0000 mg | ORAL_TABLET | Freq: Every day | ORAL | 1 refills | Status: DC

## 2016-09-23 MED ORDER — TRAMADOL HCL 50 MG PO TABS: ORAL_TABLET | ORAL | 0 refills | Status: DC

## 2016-09-23 NOTE — Progress Notes (Signed)
Pt walked 450 ft this am independently. Sts he has been having a stabbing pain in left side after walking the past few times. Seems to last a while. Ed completed with pt and wife. Good reception. He plans to quit smoking and I gave him the fake cigarette and resources. Will send referral to [facility] Yves Dill CES, ACSM 8:50 AM 09/23/2016

## 2016-09-23 NOTE — Care Management Important Message (Signed)
Important Message  Patient Details  Name: [name] MRN: [id number] Date of Birth: May 07, 1946   Medicare Important Message Given:  Yes    Nathen May 09/23/2016, 1:06 PM

## 2016-09-23 NOTE — Progress Notes (Addendum)
[address]       [place] [address]             [phone]        4 Days Post-Op Procedure(s) (LRB): REDO CORONARY ARTERY BYPASS GRAFTING (CABG) x two, using right internal mammary artery and left radial artery (N/A) TRANSESOPHAGEAL ECHOCARDIOGRAM (TEE) (N/A) RADIAL ARTERY HARVEST (Left)  Subjective: Patient with complaints of swollen left hand.  Objective: Vital signs in last 24 hours: Temp:  [97.8 F (36.6 C)-98.2 F (36.8 C)] 98.2 F (36.8 C) (05/11 0326) Pulse Rate:  [66-78] 78 (05/11 0326) Cardiac Rhythm: Normal sinus rhythm (05/11 0700) Resp:  [18] 18 (05/11 0326) BP: (117-155)/(43-54) 145/54 (05/11 0326) SpO2:  [94 %-99 %] 94 % (05/11 0326) Weight:  [124.6 kg (274 lb 11.1 oz)] 124.6 kg (274 lb 11.1 oz) (05/11 0326)  Pre op weight 95.3 kg Current Weight  09/23/16 124.6 kg (274 lb 11.1 oz)      Intake/Output from previous day: 05/10 0701 - 05/11 0700 In: 360 [P.O.:360] Out: 1835 [Urine:1835]   Physical Exam:  Cardiovascular: RRR Pulmonary: Slightly diminished at bases Abdomen: Soft, non tender, bowel sounds present. Extremities: Bilateral lower extremity edema. Left hand is more swollen than right (radial artery harvest). Motor intact and sensory mostly intact (slight numbness thumb). Wounds:  No erythema or signs of infection.  Lab Results: CBC:  Recent Labs  09/21/16 0250 09/22/16 0600  WBC 7.8 6.0  HGB 8.3* 8.3*  HCT 24.8* 24.9*  PLT 130* 151   BMET:   Recent Labs  09/22/16 0600 09/23/16 0313  NA 133* 132*  K 4.3 4.2  CL 99* 96*  CO2 26 27  GLUCOSE 92 93  BUN 25* 28*  CREATININE 1.58* 1.64*  CALCIUM 8.2* 8.1*    PT/INR:  Lab Results  Component Value Date   INR 1.56 09/19/2016   INR 1.12 09/14/2016   INR 1.08 01/31/2014   ABG:  INR: Will add last result for INR, ABG once components are confirmed Will add last 4 CBG results once components are confirmed  Assessment/Plan:  1. CV - Had peri op  SVT. SR in the 70's. On Amiodarone 200 mg bid, Plavix 75 mg daily, Imdur 15 mg daily. Not on BB secondary to previous bradycardia, but HR mostly in the 70's of late. Will either start low dose BB or restart Norvasc-will defer to Dr. Cyndia Bent. Do not want to restart Lisinopril as creatinine increased. 2.  Pulmonary - History COPD. On room air this am. Encourage incentive spirometer. Encourage incentive spirometer 3. Volume Overload - Given Metolazone 10 mg and Lasix 40 mg yesterday. He had good diuresis but creatinine slightly increased. Will give Lasix only this am. 4.  Acute blood loss anemia - H and H stable at 8.3 and 24.9. Continue Fergon. 5. Stage III CKD-creatinine slightly increased from 1.58 to 1.64 8. Chest tube sutures to remain-will be removed in office 9. Will discuss discharge disposition with Dr. Jon Billings MPA-C 09/23/2016,7:16 AM   Chart reviewed, patient examined, agree with above. Will resume Toprol 25 today and see how his HR is. Will also increase Imdur to 30 mg.  No BM yet.  Creat is up from 1.2 preop. Will check again in am. No ACE I. I think he is probably close to baseline wt. He says he has not been weighed in 3 days although there is a wt recorded that is obviously wrong. He has never weighed 274  lbs.  Plan home tomorrow if no changes.

## 2016-09-24 LAB — BASIC METABOLIC PANEL
ANION GAP: 8 (ref 5–15)
BUN: 32 mg/dL — AB (ref 6–20)
CALCIUM: 8.2 mg/dL — AB (ref 8.9–10.3)
CO2: 27 mmol/L (ref 22–32)
Chloride: 98 mmol/L — ABNORMAL LOW (ref 101–111)
Creatinine, Ser: 1.63 mg/dL — ABNORMAL HIGH (ref 0.61–1.24)
GFR calc Af Amer: 48 mL/min — ABNORMAL LOW (ref >60)
GFR calc non Af Amer: 41 mL/min — ABNORMAL LOW (ref >60)
GLUCOSE: 92 mg/dL (ref 65–99)
Potassium: 4.8 mmol/L (ref 3.5–5.1)
Sodium: 133 mmol/L — ABNORMAL LOW (ref 135–145)

## 2016-09-24 MED ORDER — POTASSIUM CHLORIDE CRYS ER 20 MEQ PO TBCR
20.0000 meq | EXTENDED_RELEASE_TABLET | Freq: Once | ORAL | Status: AC
  Administered 2016-09-24: 20 meq via ORAL
  Filled 2016-09-24: qty 1

## 2016-09-24 MED ORDER — ISOSORBIDE MONONITRATE ER 30 MG PO TB24: 30.0000 mg | ORAL_TABLET | Freq: Every day | ORAL | 0 refills | Status: DC

## 2016-09-24 MED ORDER — TRAMADOL HCL 50 MG PO TABS: ORAL_TABLET | ORAL | 0 refills | Status: DC

## 2016-09-24 MED ORDER — AMIODARONE HCL 200 MG PO TABS
200.0000 mg | ORAL_TABLET | Freq: Every day | ORAL | 1 refills | Status: DC
Start: 2016-09-24 — End: 2016-10-17

## 2016-09-24 MED ORDER — ATORVASTATIN CALCIUM 40 MG PO TABS: 40.0000 mg | ORAL_TABLET | Freq: Every day | ORAL | 1 refills | Status: DC

## 2016-09-24 MED ORDER — FUROSEMIDE 40 MG PO TABS
40.0000 mg | ORAL_TABLET | Freq: Every day | ORAL | 0 refills | Status: DC
Start: 2016-09-24 — End: 2016-10-17

## 2016-09-24 NOTE — Progress Notes (Addendum)
      [address]       [place] [address]             [phone]        5 Days Post-Op Procedure(s) (LRB): REDO CORONARY ARTERY BYPASS GRAFTING (CABG) x two, using right internal mammary artery and left radial artery (N/A) TRANSESOPHAGEAL ECHOCARDIOGRAM (TEE) (N/A) RADIAL ARTERY HARVEST (Left)  Subjective: Patient had bowel movement (twice)-wants to go home.  Objective: Vital signs in last 24 hours: Temp:  [98 F (36.7 C)-98.6 F (37 C)] 98 F (36.7 C) (05/12 0512) Pulse Rate:  [62-77] 67 (05/12 0512) Cardiac Rhythm: Normal sinus rhythm (05/12 0700) Resp:  [18-24] 24 (05/12 0512) BP: (119-135)/(45-58) 122/53 (05/12 0512) SpO2:  [94 %-98 %] 94 % (05/12 0512) Weight:  [99.1 kg (218 lb 6.4 oz)-100 kg (220 lb 7.4 oz)] 99.1 kg (218 lb 6.4 oz) (05/12 0512)  Pre op weight 95.3 kg Current Weight  09/24/16 99.1 kg (218 lb 6.4 oz)      Intake/Output from previous day: 05/11 0701 - 05/12 0700 In: 960 [P.O.:960] Out: 2355 [Urine:2355]   Physical Exam:  Cardiovascular: RRR Pulmonary: Slightly diminished at bases Abdomen: Soft, non tender, bowel sounds present. Extremities: Mild bilateral lower extremity edema. Left hand is more swollen than right (radial artery harvest). Motor intact and sensory mostly intact (slight numbness thumb). Wounds:  No erythema or signs of infection.  Lab Results: CBC:  Recent Labs  09/22/16 0600  WBC 6.0  HGB 8.3*  HCT 24.9*  PLT 151   BMET:   Recent Labs  09/23/16 0313 09/24/16 0251  NA 132* 133*  K 4.2 4.8  CL 96* 98*  CO2 27 27  GLUCOSE 93 92  BUN 28* 32*  CREATININE 1.64* 1.63*  CALCIUM 8.1* 8.2*    PT/INR:  Lab Results  Component Value Date   INR 1.56 09/19/2016   INR 1.12 09/14/2016   INR 1.08 01/31/2014   ABG:  INR: Will add last result for INR, ABG once components are confirmed Will add last 4 CBG results once components are confirmed  Assessment/Plan:  1. CV - Had peri op SVT. SR in  the 70's.On Amiodarone 200 mg bid, Toprol XL 25 mg daily, Plavix 75 mg daily, and Imdur 30 mg daily. Will decrease Amiodarone to 200 mg daily at discharge. 2.  Pulmonary - History COPD. On room air. Encourage incentive spirometer. Encourage incentive spirometer 3. Volume Overload -LE edema and weight decreasing but still needs diuresis. Continue with daily Lasix for at least 5 days after discharge 4.  Acute blood loss anemia - H and H stable at 8.3 and 24.9. Continue Fergon. 5. Stage III CKD-creatinine remains 1.63 (admission creatinine 1.59). 8. Chest tube sutures to remain-will be removed in office 9. Discharge later today  ZIMMERMAN,DONIELLE MPA-C 09/24/2016,7:15 AM

## 2016-09-24 NOTE — Progress Notes (Signed)
RN provided discharge instructions and new prescriptions to patient and wife at bedside. Patient signed discharge instructions. Patient and wife report understanding discharge instructions and plan to get prescriptions filled today after leaving the hospital. Patient denies any pain at this time. Patient peripheral IV removed.  Patient and wife verified that they are in possession of all belongings. Patient transported to vehicle at hospital entrance via wheelchair.    Izola Price, RN 09/24/2016

## 2016-10-04 ENCOUNTER — Ambulatory Visit (INDEPENDENT_AMBULATORY_CARE_PROVIDER_SITE_OTHER): Payer: Self-pay | Admitting: *Deleted

## 2016-10-04 ENCOUNTER — Telehealth (HOSPITAL_COMMUNITY): Payer: Self-pay | Admitting: Physician Assistant

## 2016-10-04 VITALS — HR 70 | Resp 18 | Ht 69.0 in

## 2016-10-04 DIAGNOSIS — Z951 Presence of aortocoronary bypass graft: Secondary | ICD-10-CM

## 2016-10-04 DIAGNOSIS — Z4802 Encounter for removal of sutures: Secondary | ICD-10-CM

## 2016-10-04 NOTE — Telephone Encounter (Signed)
      [name]       [place] [address]             [phone]    CABELL LAZENBY [id number]    He is s/p redo median sternotomy, redo coronary artery bypass grafting x 2    Free right internal mammary artery graft to the diagonal  Left radial artery graft to the distal RCA  With harvest of left radial artery 09/19/2016 by Dr. Cyndia Bent. He was discharged home on 09/24/2016.  Medications: Patient states he was able to fill and is taking according to his discharge instructions.  Problems/Concerns: Patient denies at this time.  Assessment:  Patient states he is doing well. He is walking everyday. He was instructed to contact office if concerns or problems develop  Follow up Appointment:  [id number] OFFICE VISIT 20 with Carlyle Dolly, MD  Monday June 4 11:00 AM (Eartha Inch by 10:45 AM)  [place]  [place]  [address]  [phone]   [id number] Follow Up Cardiac with [name], MD  Wednesday June 13 11:30 AM  [facility]  [address] [place], [place] [address]  [phone]

## 2016-10-04 NOTE — Patient Instructions (Signed)
Sutures removed x 4.  All incisions are dry and intact. Patient to return June 13th to see Dr. Cyndia Bent.

## 2016-10-05 NOTE — Progress Notes (Signed)
D/c CTS x 4.  All incisions clean, dry and intact.  Patient is doing well and has no complaints.  Patient will return to see Dr. Cyndia Bent for routine f/u appt on June 13th.

## 2016-10-17 ENCOUNTER — Ambulatory Visit (INDEPENDENT_AMBULATORY_CARE_PROVIDER_SITE_OTHER): Payer: Medicare Other | Admitting: Cardiology

## 2016-10-17 ENCOUNTER — Encounter: Payer: Self-pay | Admitting: Cardiology

## 2016-10-17 VITALS — BP 182/70 | HR 60 | Ht 69.0 in | Wt 216.0 lb

## 2016-10-17 DIAGNOSIS — I10 Essential (primary) hypertension: Secondary | ICD-10-CM | POA: Diagnosis not present

## 2016-10-17 DIAGNOSIS — I2510 Atherosclerotic heart disease of native coronary artery without angina pectoris: Secondary | ICD-10-CM

## 2016-10-17 DIAGNOSIS — I471 Supraventricular tachycardia: Secondary | ICD-10-CM | POA: Diagnosis not present

## 2016-10-17 NOTE — Progress Notes (Signed)
Clinical Summary [name] is a 71 y.o.male seen today for follow up of the following medical problems. This is a focused visit on his history of CAD and recent repeat CABG.   1. CAD - hx of CABG in Jan 2008 (LIMA-LAD, SVG-D2, SVG-OM, SVG-RPL) - admit 01/30/14 [name] with NSTEMI, received DES to SVG-RCA. No LV gram.  - 08/2014 echo from [place] LVEF 60-65%  - had nuclear stress 2 at [facility]. Large moderate lateral defect fixed. 11/2015.  - cath at [facility] 03/18/16. LM 20%, prox LAD 100% CTO, D2 diffuse disease, LCX LIs, OM2 80%, RCA right PL segment 90%. LIMA patent, SVG-OM2 patent, SVG-D2 50% stenosis, SVG-right PL occluded. Recs were for medical management. Fairly stable findings from prior cath however SVG-RCA PL which was previously stented is now occluded.    - admit 09/2016 with NSTEMI, peak troponin 0.43. Cath as reported below. He was referred for repeat CABG - CABG  09/19/16 with RIMA-diag, left radial to distal RCA - echo 09/2016 LVEF 50-55% - has not been on ACE-I due to poor renal function  - since discharge no recent chest pain. Has some SOB at times. Can have some SOB. Has had some LE edema. - CXR bibasilar atelectais and pleural effusion - compliant with meds   2. Postoperative SVT - occurred after recent CABG - started on oral amiodarone - no recent palpitations  Past Medical History:  Diagnosis Date  . Acute myocardial infarction of other inferior wall, initial episode of care   . Anginal pain ([name])   . Anxiety   . Arthritis   . Cancer [facility])    bladder 2013 removed, has cystoscopy 10/2016, has returned x 2  . COPD (chronic obstructive pulmonary disease) ([place])   . Coronary artery disease    Artery bypass graft Jan 2008  . Dyspnea   . Dysrhythmia   . GERD (gastroesophageal reflux disease)   . Heart murmur   . History of hiatal hernia   . Hyperlipidemia   . Hypertension   . Hypothyroidism   . PONV (postoperative nausea and vomiting)   . Sleep apnea   .  Stroke ([place])   . Tobacco user      Allergies  Allergen Reactions  . Cardizem Cd [Diltiazem Hcl Er Beads] Palpitations    "Makes heart skip"     Current Outpatient Prescriptions  Medication Sig Dispense Refill  . acetaminophen (TYLENOL) 325 MG tablet Take 2 tablets (650 mg total) by mouth every 6 (six) hours as needed for mild pain.    Marland Kitchen amiodarone (PACERONE) 200 MG tablet Take 1 tablet (200 mg total) by mouth daily. 30 tablet 1  . aspirin 81 MG chewable tablet Chew 1 tablet (81 mg total) by mouth daily.    Marland Kitchen atorvastatin (LIPITOR) 40 MG tablet Take 1 tablet (40 mg total) by mouth daily. 30 tablet 1  . clopidogrel (PLAVIX) 75 MG tablet Take 1 tablet (75 mg total) by mouth daily. 30 tablet 1  . ergocalciferol (VITAMIN D2) 50000 UNITS capsule Take 50,000 Units by mouth every Sunday.     . ferrous gluconate (FERGON) 324 MG tablet Take 1 tablet (324 mg total) by mouth daily with breakfast. For one month then stop. 30 tablet 0  . furosemide (LASIX) 40 MG tablet Take 1 tablet (40 mg total) by mouth daily. For one week then stop. 7 tablet 0  . isosorbide mononitrate (IMDUR) 30 MG 24 hr tablet Take 1 tablet (30 mg total)  by mouth daily. For one month then stop. 30 tablet 0  . levothyroxine (SYNTHROID, LEVOTHROID) 25 MCG tablet Take 25 mcg by mouth daily before breakfast.    . metoprolol succinate (TOPROL-XL) 25 MG 24 hr tablet Take 25 mg by mouth daily.    . Omega-3 Fatty Acids (RA FISH OIL) 1000 MG CAPS Take 2,000 mg by mouth 2 (two) times daily.     Marland Kitchen omeprazole (PRILOSEC) 20 MG capsule Take 40 mg by mouth daily.     . traMADol (ULTRAM) 50 MG tablet Take 50 mg by mouth every 4-6 hours PRN moderate to severe pain. 30 tablet 0   No current facility-administered medications for this visit.      Past Surgical History:  Procedure Laterality Date  . APPENDECTOMY    . CARDIAC CATHETERIZATION    . CORONARY ARTERY BYPASS GRAFT  06/14/2006   x 5  . CORONARY ARTERY BYPASS GRAFT N/A 09/19/2016    Procedure: REDO CORONARY ARTERY BYPASS GRAFTING (CABG) x two, using right internal mammary artery and left radial artery;  Surgeon: Mark Pollack, MD;  Location: [place];  Service: Open Heart Surgery;  Laterality: N/A;  . FOOT SURGERY    . HERNIA REPAIR    . LEFT HEART CATH AND CORS/GRAFTS ANGIOGRAPHY N/A 09/14/2016   Procedure: Left Heart Cath and Cors/Grafts Angiography;  Surgeon: Mark Sine, MD;  Location: [place] CV LAB;  Service: Cardiovascular;  Laterality: N/A;  . LEFT HEART CATHETERIZATION WITH CORONARY/GRAFT ANGIOGRAM N/A 01/31/2014   Procedure: LEFT HEART CATHETERIZATION WITH [name];  Surgeon: Mark Klein, MD;  Location: [facility] CATH LAB;  Service: Cardiovascular;  Laterality: N/A;  . NOSE SURGERY    . PERCUTANEOUS CORONARY STENT INTERVENTION (PCI-S)  01/31/2014   Procedure: PERCUTANEOUS CORONARY STENT INTERVENTION (PCI-S);  Surgeon: Mark Klein, MD;  Location: [facility] CATH LAB;  Service: Cardiovascular;;  . RADIAL ARTERY HARVEST Left 09/19/2016   Procedure: RADIAL ARTERY HARVEST;  Surgeon: Mark Pollack, MD;  Location: [facility];  Service: Open Heart Surgery;  Laterality: Left;  . TEE WITHOUT CARDIOVERSION N/A 09/19/2016   Procedure: TRANSESOPHAGEAL ECHOCARDIOGRAM (TEE);  Surgeon: Mark Pollack, MD;  Location: [place] [facility];  Service: Open Heart Surgery;  Laterality: N/A;     Allergies  Allergen Reactions  . Cardizem Cd [Diltiazem Hcl Er Beads] Palpitations    "Makes heart skip"      Family History  Problem Relation Age of Onset  . Heart attack Mother   . Heart attack Brother      Social History [name] reports that he has been smoking Cigarettes.  He started smoking about 57 years ago. He has a 40.00 pack-year smoking history. He has never used smokeless tobacco. [name] reports that he does not drink alcohol.   Review of Systems CONSTITUTIONAL: No weight loss, fever, chills, weakness or fatigue.  HEENT: Eyes: No visual loss, blurred vision, double  vision or yellow sclerae.No hearing loss, sneezing, congestion, runny nose or sore throat.  SKIN: No rash or itching.  CARDIOVASCULAR: per hpi RESPIRATORY: No shortness of breath, cough or sputum.  GASTROINTESTINAL: No anorexia, nausea, vomiting or diarrhea. No abdominal pain or blood.  GENITOURINARY: No burning on urination, no polyuria NEUROLOGICAL: No headache, dizziness, syncope, paralysis, ataxia, numbness or tingling in the extremities. No change in bowel or bladder control.  MUSCULOSKELETAL: No muscle, back pain, joint pain or stiffness.  LYMPHATICS: No enlarged nodes. No history of splenectomy.  PSYCHIATRIC: No history of depression or anxiety.  ENDOCRINOLOGIC:  No reports of sweating, cold or heat intolerance. No polyuria or polydipsia.  Marland Kitchen   Physical Examination Vitals:   10/17/16 1056  BP: (!) 182/70  Pulse: 60   Vitals:   10/17/16 1056  Weight: 216 lb (98 kg)  Height: 5\' 9"  (1.753 m)    Gen: resting comfortably, no acute distress HEENT: no scleral icterus, pupils equal round and reactive, no palptable cervical adenopathy,  CV: RRR, no m/r/g, no jvd Resp: Clear to auscultation bilaterally GI: abdomen is soft, non-tender, non-distended, normal bowel sounds, no hepatosplenomegaly MSK: extremities are warm, no edema.  Skin: warm, no rash Neuro:  no focal deficits Psych: appropriate affect   Diagnostic Studies 01/31/14 Cath Angiographic Findings: 1. The left main coronary arteryis not stenotic, but exhibits significant atherosclerosis and bifurcates in the usual fashion into the left anterior descending artery and left circumflex coronary artery.  2. The left anterior descending arteryis a large vessel that reaches the apex and generates several proximal septals, but is occluded before any of the major diagonal branches. There is evidence of extensive luminal irregularities and mild calcification.  3. The left circumflex coronary arteryis a medium-size vessel non  dominant vessel that generates two major oblique marginal arteries. There is evidence of extensive luminal irregularities and mild calcification. No hemodynamically meaningful stenoses are seen. The first OM is flush occluded. The distal OM is medium in size.  4. The right coronary arteryis a very large-size dominant vessel that generates a posterior lateral ventricular artery that is proximally occluded as well as a diffusely diseased PDA. There is evidence of extensive luminal irregularities and mild calcification.  5. The sequential SVG to the diagonal and first OMis very irregular in caliber, but widely patent, without stenoses. The first diagonal artery is severely diseased and small in caliber. The OM Munachimso Rigdon is large and free of major stenoses.  6. The SVG to the second diagonalis widely patent and healthy, feeding a relatively large vessel.  7. The LIMA to the LADis widely patent with both retrograde and antegrade LAD flow. There are significant stenoses both upstream and downstream of the anastomosis, but there is excellent flow.  8. The SVG to RCA-PLA has a severe, ulcerated stenosis in its distal third, at least 95% in severity, but with TIMI 3 flow. It is the culprit lesion.  5. The left ventriclewas not injected.There is no aortic valve stenosis by pullback. The left ventricular end-diastolic pressure is 24 mm Hg.   IMPRESSIONS: High grade unstable stenosis in the SVG to PLA causing NSTEMI  RECOMMENDATION: Urgent PCI, preferably with filter wire.   IMPRESSIONS: 1. Successful PCI of the SVG to right coronary artery graft with a 3.0 x 23 Alpine drug-eluting stent - dilated to 3.25 mm in diameter, using a distal embolic protection device, 4.0 spider. 2.         04/2014 Carotid US Moderate bilateral stenosis  12/2011 Abdominal US No aortic aneurysm   Jan 2013 [facility] LVEF 65%      08/2014 Echo [facility] LVEF 60-65%, aortic valve sclerosis.    cath at  [facility] 03/18/16. LM 20%, prox LAD 100% CTO, D2 diffuse disease, LCX LIs, OM2 80%, RCA right PL segment 90%. LIMA patent, SVG-OM2 patent, SVG-D2 50% stenosis, SVG-right PL occluded. Recs were for medical management.  09/2016 cath  The left ventricular ejection fraction is 45-50% by visual estimate.  LV end diastolic pressure is normal.  There is mild left ventricular systolic dysfunction.  Dist RCA lesion, 90 %stenosed.  SVG.  Dist Graft-1 lesion, 95 %stenosed.  Dist Graft-2 lesion, 50 %stenosed.  Prox Graft lesion, 80 %stenosed.  Ost 2nd Mrg lesion, 100 %stenosed.  SVG.  Mid Graft lesion, 100 %stenosed.  Prox Graft lesion, 100 %stenosed.  Seq SVG-.  Origin lesion, 99 %stenosed.  Prox LAD lesion, 100 %stenosed.  Prox Graft to Dist Graft lesion, 25 %stenosed.  Ost LAD to Prox LAD lesion, 80 %stenosed.   Multivessel native CAD with diffuse 80% proximal LAD stenosis and occlusion of the LAD after a small first diagonal vessel; occluded marginal Cassian Torelli arising from the mid AV groove circumflex with an otherwise patent AV groove circumflex extending distally; RCA with 20% mid narrowing normal PDA, but with 90% PLA stenosis.  Patent LIMA graft supplying the mid LAD.  SVG sequentially supplying the diagonal-1 and OM vessel with 99% near ostial graft stenosis and diffuse luminal irregularity in the body of the graft prior to the initial anastomosis.  SVG supplying the diagonal 2 vessel with diffuse proximal to mid graft stenosis, 95% ulcerated plaque stenosis in the distal third of the graft and 50% eccentric stenosis prior to its anastomosis.  SVG which had previously supplied the PLA and had been stented, is occluded proximally.  Mild-to-moderate LV dysfunction with mid distal anterolateral hypocontractility and an ejection fraction of 45-50%.  RECOMMENDATION: The patient had previously undergone CABG surgery by Dr. Cyndia Bent in 2008.  Dr. Cyndia Bent will be re-consulted for  evaluation and consideration of possible redo CABG revascularization surgery.   09/2016 echo Study Conclusions  - Left ventricle: The cavity size was normal. Wall thickness was   increased in a pattern of mild LVH. Systolic function was normal.   The estimated ejection fraction was in the range of 50% to 55%.   Abnormal GLPSS at -15%, inferoseptal strain abnormality. Basal to   mid inferior/inferoseptal hypokinesis. Doppler parameters are   consistent with abnormal left ventricular relaxation (grade 1   diastolic dysfunction). The E/e&' ratio is between 8-15,   suggesting indeterminate LV filling pressure. - Aortic valve: Poorly visualized. Mild stenosis. Mildly calcified   leaflets. There was mild regurgitation. Mean gradient (S): 13 mm   Hg. Peak gradient (S): 28 mm Hg. - Left atrium: The atrium was normal in size. - Inferior vena cava: The vessel was normal in size. The   respirophasic diameter changes were in the normal range (>= 50%),   consistent with normal central venous pressure.  Impressions:  - LVEF 50-55%, mild LVH, basal to mid inferior, inferoseptal   hypokinesis, abnormal GLPSS at -15%, grade 1 DD with   indeterminate LV filling pressure, mild calcific aortic stenosis   - mean gradient of 13 mmHg, normal LA size, normal IVC.  09/2016 Carotid US/ABI preCABG Summary:  1. Carotid Duplex Evaluation - Right internal carotid artery 1-39%    stenosis, left internal carotid artery 40-59% stenosis. 2. Palmar Arch Evaluation - Right radial artery decreased greater    than 50 percent with compression and right ulnar remains within    normal limits with compression. . Left Doppler waveforms remain    within normal limits with radial and ulnar compression. 3. ABI Evaluation - Right ABI appeared normal at rest. Left ABI is    suggestive of moderate arterial disease at rest.  Assessment and Plan   1. CAD - s/p recent repeat CABG - no current symptoms - continue current  meds including DAPT s/p recent NSTEMI  2. PSVT - occurred after recent CABG - EKG in clinic today shows  NSR - we will d/c amiodarone  3. HTN - elevated in clinic - home numbers are at goal - he will submit bp log in 1 week     Arnoldo Lenis, M.D.

## 2016-10-17 NOTE — Patient Instructions (Addendum)
Medication Instructions:   Stop Amiodarone.  Continue all other medications.    Labwork: none  Testing/Procedures: none   Follow-Up: 3 months   Any Other Special Instructions Will Be Listed Below (If Applicable). Your physician has requested that you regularly monitor and record your blood pressure readings at home 1 weeks.  Please return to office for MD review.    If you need a refill on your cardiac medications before your next appointment, please call your pharmacy.

## 2016-10-21 ENCOUNTER — Other Ambulatory Visit: Payer: Self-pay | Admitting: Physician Assistant

## 2016-10-25 ENCOUNTER — Other Ambulatory Visit: Payer: Self-pay | Admitting: Surgery

## 2016-10-25 DIAGNOSIS — Z951 Presence of aortocoronary bypass graft: Secondary | ICD-10-CM

## 2016-10-26 ENCOUNTER — Ambulatory Visit (INDEPENDENT_AMBULATORY_CARE_PROVIDER_SITE_OTHER): Payer: Self-pay | Admitting: Surgery

## 2016-10-26 ENCOUNTER — Ambulatory Visit
Admission: RE | Admit: 2016-10-26 | Discharge: 2016-10-26 | Disposition: A | Discharge status: Home / Self Care | Payer: Medicare Other | Source: Ambulatory Visit | Attending: Surgery | Admitting: Surgery

## 2016-10-26 ENCOUNTER — Telehealth: Payer: Self-pay

## 2016-10-26 ENCOUNTER — Telehealth: Payer: Self-pay | Admitting: *Deleted

## 2016-10-26 ENCOUNTER — Encounter: Payer: Self-pay | Admitting: Surgery

## 2016-10-26 VITALS — BP 192/70 | HR 60 | Resp 16 | Ht 69.0 in | Wt 212.5 lb

## 2016-10-26 DIAGNOSIS — I2510 Atherosclerotic heart disease of native coronary artery without angina pectoris: Secondary | ICD-10-CM

## 2016-10-26 DIAGNOSIS — J449 Chronic obstructive pulmonary disease, unspecified: Secondary | ICD-10-CM | POA: Diagnosis not present

## 2016-10-26 DIAGNOSIS — Z951 Presence of aortocoronary bypass graft: Secondary | ICD-10-CM

## 2016-10-26 MED ORDER — AMLODIPINE BESYLATE 10 MG PO TABS
10.0000 mg | ORAL_TABLET | Freq: Every day | ORAL | 3 refills | Status: DC
Start: 2016-10-26 — End: 2016-11-18

## 2016-10-26 NOTE — Telephone Encounter (Signed)
Pt walked in requesting we check BP machine - says he wanted to make sure monitor was accurate before starting BP log - 168/62 HR 58 manual nurse check reading - 171/68 HR 55 on pt BP monitor. Pt will change batteries at home says they haven't been changed in several years but that monitor was comparable to manual check and ok to use. Pt had not taken amlodipine 10 mg yet was going to pick up from pharmacy today. Routed to provider as Juluis Rainier

## 2016-10-26 NOTE — Progress Notes (Signed)
HPI: Patient returns for routine postoperative follow-up having undergone redo CABG x 2 using a free RIMA graft to the diagonal and a left radial graft to the distal RCA on 09/19/2016. The patient's early postoperative recovery while in the hospital was notable for an uncomplicated postop course. Since hospital discharge the patient reports that he has been feeling well. He has abstained from smoking. He is walking daily without chest pain. His previous heart burn symptoms are gone. He still gets some shortness of breath with walking up hills but it is mild and improving. He has been found to be very hypertensive recently. His BP was 182/70 when he saw Dr. Harl Bowie recently. He was seen at the [facility] yesterday and said that his SBP was 190's and he was sent to the ER where he was given some clonidine to bring it down.    Current Outpatient Prescriptions  Medication Sig Dispense Refill  . acetaminophen (TYLENOL) 325 MG tablet Take 2 tablets (650 mg total) by mouth every 6 (six) hours as needed for mild pain.    Marland Kitchen aspirin 81 MG chewable tablet Chew 1 tablet (81 mg total) by mouth daily.    Marland Kitchen atorvastatin (LIPITOR) 40 MG tablet Take 1 tablet (40 mg total) by mouth daily. 30 tablet 1  . clopidogrel (PLAVIX) 75 MG tablet Take 1 tablet (75 mg total) by mouth daily. 30 tablet 1  . ergocalciferol (VITAMIN D2) 50000 UNITS capsule Take 50,000 Units by mouth every Sunday.     . levothyroxine (SYNTHROID, LEVOTHROID) 25 MCG tablet Take 25 mcg by mouth daily before breakfast.    . metoprolol succinate (TOPROL-XL) 25 MG 24 hr tablet Take 25 mg by mouth daily.    . Omega-3 Fatty Acids (RA FISH OIL) 1000 MG CAPS Take 2,000 mg by mouth 2 (two) times daily.     Marland Kitchen omeprazole (PRILOSEC) 20 MG capsule Take 40 mg by mouth daily.      No current facility-administered medications for this visit.     Physical Exam: BP (!) 192/70 (BP Location: Right Arm, Patient Position: Sitting, Cuff Size: Large)   Pulse 60   Resp 16    Ht 5\' 9"  (1.753 m)   Wt 212 lb 8 oz (96.4 kg)   SpO2 98% Comment: RA  BMI 31.38 kg/m  He looks well Lung exam is clear. Cardiac exam shows a regular rate and rhythm with normal heart sounds. Chest incision is healing well and sternum is stable. The left arm incision is healing well. There is no peripheral edema   Diagnostic Tests:  CLINICAL DATA:  Status post CABG on Sep 19, 2016. No current chest complaints. History of COPD, bladder malignancy.  EXAM: CHEST  2 VIEW  COMPARISON:  PA and lateral chest x-ray of Sep 22, 2016.  FINDINGS: The lungs are adequately inflated. There is no focal infiltrate. There is no pleural effusion. The interstitial markings are coarse though stable. The heart and pulmonary vascularity are normal. The sternal wires are intact. The retrosternal soft tissues are normal. There is calcification in the wall of the aortic arch. There is multilevel degenerative disc disease of the thoracic spine.  IMPRESSION: COPD. There is no acute cardiopulmonary abnormality. Previously demonstrated bibasilar atelectasis and small effusions have resolved. The cardiac silhouette has normalized.  Thoracic aortic atherosclerosis.   Electronically Signed   By: David  Martinique M.D.   On: 10/26/2016 11:05   Impression:  He is doing well overall but has persistent HTN. He  is only on Toprol 25 mg daily and was on Norvasc 10 mg daily, lisinopril 40 mg daily and Isordil and Imdur preop. These were not started postop due to his BP being lower. He says that he has been checking his BP at home and it has been in the 130-160 range. He is going to call Dr. Harl Bowie today to follow up on his BP. His last creatinine on 10/17/2016 at the [facility] was 1.69 so I don't think he can get back on lisinopril. I told him that he can return to driving but should not lift anything heavier than 10 lbs for 3 months postop.  Plan:  He will continue to follow up with Dr. Harl Bowie and his  physician at the [facility].   [name], MD [facility] [phone]

## 2016-10-26 NOTE — Telephone Encounter (Signed)
Pt made aware, he will start medication and let us know next week what his bp readings are.

## 2016-10-26 NOTE — Telephone Encounter (Signed)
-----   Message from Arnoldo Lenis, MD sent at 10/26/2016  1:43 PM EDT ----- Let pt know saw bp's from his recent clinic visit, running too high. We need to restart his norvasc 10mg  daily. Have him call us in 1 week with bp log values  Zandra Abts MD

## 2016-11-02 ENCOUNTER — Telehealth: Payer: Self-pay | Admitting: *Deleted

## 2016-11-02 DIAGNOSIS — I10 Essential (primary) hypertension: Secondary | ICD-10-CM

## 2016-11-02 NOTE — Telephone Encounter (Signed)
Patient dropped off note in regards to his blood pressure.  Stated that he started taking the Norvasc on 10/27/2016.  When he gets up - BP's running 160's - 170's.  Takes his medicine between 8-9:00 am & by lunch BP is in the upper 140's - 150's.  Stays that way until the evening time & then starts to go back up.  Message sent to provider.

## 2016-11-03 MED ORDER — LISINOPRIL 10 MG PO TABS: 10.0000 mg | ORAL_TABLET | Freq: Every day | ORAL | 6 refills | Status: DC

## 2016-11-03 NOTE — Telephone Encounter (Signed)
Patient notified.  New medication sent to [facility] now.  Lab order will be mailed to patient as he prefers to do his labs at the [place] in [place].

## 2016-11-03 NOTE — Telephone Encounter (Signed)
Please start lisionpril 10mg  daily. BMET in 2 weeks. Have him update [place] on his bp's in 2 weeks   Zandra Abts MD

## 2016-11-17 ENCOUNTER — Telehealth: Payer: Self-pay | Admitting: *Deleted

## 2016-11-17 NOTE — Telephone Encounter (Signed)
Pt dropped off BP Log for the last 2 weeks - pt wrote on log about swelling in feet/legs - f/u call made to pt who c/o increased weakness and swelling in legs/feet - denies any increased SOB/dizziness/chest pain - says he has gained 2lbs in the last few days - says he will have lab work done tomorrow at [facility] - BP log on Dr Nelly Laurence desk for review (in [place] office tomorrow)

## 2016-11-18 ENCOUNTER — Encounter: Payer: Self-pay | Admitting: *Deleted

## 2016-11-18 MED ORDER — FUROSEMIDE 20 MG PO TABS: 20.0000 mg | ORAL_TABLET | Freq: Every day | ORAL | 3 refills | Status: DC | PRN

## 2016-11-18 NOTE — Telephone Encounter (Signed)
Please have the labs sent to [place]. He may start lasix 20mg  qday prn swelling only. I would try to limit the total dosing of the lasix to protect his kidney funciton, but we also want to keep him from building up too much fluid. This can also be a side effect to norvasc which we also recently started, particularly given the swelling in the hands which does not typically happen with regular fluid overload.  Have him lower his norvasc to 5mg  daily. Update [place] early next week on swelling.    Zandra Abts MD

## 2016-11-18 NOTE — Telephone Encounter (Signed)
Pt voiced understanding - lasix sent to [facility] - pt said he would take 1/2 of 10 mg tablets of Norvasc and call when he needed a refill - will request lab work from [place] [facility] in [place] (pt had labs drawn today) - updated medication list and pt will f/u with swelling next week

## 2016-11-22 ENCOUNTER — Telehealth: Payer: Self-pay | Admitting: *Deleted

## 2016-11-22 NOTE — Telephone Encounter (Signed)
F/u from 7/5 phone note - still c/o swelling in feet/legs - also says since cutting amlodipine BP has been running 150s-160s/50s HR remaining in the 50s. Labs from [place] [facility] were scanned in to chart - pt says he think he has gained around 7lbs total but also says he has been eating more than normal. Denies any other symptoms.

## 2016-11-24 NOTE — Telephone Encounter (Signed)
Looks like pt is to call with an update tomorrow - did not see the message under lab results since phone note with Dr Nelly Laurence note was not created - will attach to this phone note    Notes recorded by Drema Dallas, CMA on 11/22/2016 at 1:51 PM EDT Pt made aware of lab results. He voiced understanding about medication. He will update [place] on Friday. ------  Notes recorded by Arnoldo Lenis, MD on 11/22/2016 at 1:28 PM EDT Kidney function looks quite a bit better. If still having swelling and the lasix 20mg  prn as needed isnt working can try taking 40mg  as needed. Getting some fluid off may help with bp as well. I would take 40mg  daily Tuesday, Wed, Thursday and then call and update [place] Friday on weights and swelling

## 2016-11-24 NOTE — Telephone Encounter (Signed)
I addressed this phone message in a lab resulted note that I sent to Debbora Lacrosse. Has he called to update [place] yet on his swelling?   Zandra Abts MD

## 2016-11-25 ENCOUNTER — Telehealth: Payer: Self-pay | Admitting: *Deleted

## 2016-11-25 NOTE — Telephone Encounter (Signed)
Patient was told to call back today with update on how he is doing.  Stated he is not doing any better.  Weight this morning was 218 lbs.  Still has swelling in feet / ankles.  States that his legs do go down at night, but feet does not.  No c/o SOB or chest pain.

## 2016-11-28 NOTE — Telephone Encounter (Signed)
Pt will come at 340 Wednesday - scheduler will add to schedule

## 2016-11-28 NOTE — Telephone Encounter (Signed)
[name] called the office stating that he never received a call back from the office.

## 2016-11-28 NOTE — Telephone Encounter (Signed)
We need to sit down and go through everything to figure out whats going on, can he see me Wed at 340 or 4pm    Zandra Abts MD

## 2016-11-30 ENCOUNTER — Encounter: Payer: Self-pay | Admitting: Cardiology

## 2016-11-30 ENCOUNTER — Telehealth: Payer: Self-pay | Admitting: Cardiology

## 2016-11-30 ENCOUNTER — Ambulatory Visit (INDEPENDENT_AMBULATORY_CARE_PROVIDER_SITE_OTHER): Payer: Medicare Other | Admitting: Cardiology

## 2016-11-30 VITALS — BP 164/59 | HR 62 | Ht 69.0 in | Wt 223.0 lb

## 2016-11-30 DIAGNOSIS — R0602 Shortness of breath: Secondary | ICD-10-CM | POA: Diagnosis not present

## 2016-11-30 DIAGNOSIS — I10 Essential (primary) hypertension: Secondary | ICD-10-CM

## 2016-11-30 DIAGNOSIS — I5031 Acute diastolic (congestive) heart failure: Secondary | ICD-10-CM | POA: Diagnosis not present

## 2016-11-30 DIAGNOSIS — R5383 Other fatigue: Secondary | ICD-10-CM

## 2016-11-30 MED ORDER — FUROSEMIDE 40 MG PO TABS: 60.0000 mg | ORAL_TABLET | Freq: Every day | ORAL | 3 refills | Status: DC

## 2016-11-30 MED ORDER — AMLODIPINE BESYLATE 10 MG PO TABS: 10.0000 mg | ORAL_TABLET | Freq: Every day | ORAL | Status: DC

## 2016-11-30 NOTE — Patient Instructions (Signed)
Medication Instructions:   Increase Lasix to 60mg  daily.  Increase Norvasc to 10mg  daily.   Patient states he will use medications he has at home & contact pharmacy for refill request when needed.  Office will contact with results via phone or letter.    Labwork:  Magnesium, TSH, BMET - orders given today.  Due in 2 weeks, around 12/14/2016.    Office will contact with results via phone or letter.    Testing/Procedures:  Your physician has requested that you have an echocardiogram. Echocardiography is a painless test that uses sound waves to create images of your heart. It provides your doctor with information about the size and shape of your heart and how well your heart's chambers and valves are working. This procedure takes approximately one hour. There are no restrictions for this procedure.  Office will contact with results via phone or letter.    Follow-Up: 3-4 weeks   Any Other Special Instructions Will Be Listed Below (If Applicable). Call on Monday with update on weights.   If you need a refill on your cardiac medications before your next appointment, please call your pharmacy.

## 2016-11-30 NOTE — Progress Notes (Signed)
Clinical Summary [name] is a 71 y.o.male seen today for follow up of the following medical problems. This is a focused visit on recent diastolic HF and HTN. For more detailed history please see prior clinic notes.    1. Acute diastolic HF - since discharge no recent chest pain. Significant progression of LE edema and SOB over the last few days. WEight up roughly 10 lbs.  - he reports this is the worst swelling he has ever had - has been compliant with diuretic. Taking 40mg  daily has not helped.  - of note he was transiently on amiodarone postop recent CABG, needs TSH check in setting of leg edema.  - swelling did not improve with lower dose of norvasc. Had been on norvasc before without troubles, with this degree of weight gain does not appear to be the issue.    2. HTN - meds changed during recent admission with CABG -  norvasc 10, HCTZ 25,  lisionpril 40mg  was stopped during that admission - 10/26/16 we restarted norvasc 10mg , and later lisionpril 10mg  daily.  - he developed some swelling, and we lowered his norvasc to 5mg  daily and started lasix 20mg  prn, and then 40mg  prn.  - reports 7 lbs weight gain.   11/19/16 labs: Cr 1.28, BUN 22, K 3.6. Cr had been up to 1.7 in June 2018       Past Medical History:  Diagnosis Date  . Acute myocardial infarction of other inferior wall, initial episode of care   . Anginal pain ([place])   . Anxiety   . Arthritis   . Cancer [facility])    bladder 2013 removed, has cystoscopy 10/2016, has returned x 2  . COPD (chronic obstructive pulmonary disease) ([place])   . Coronary artery disease    Artery bypass graft Jan 2008  . Dyspnea   . Dysrhythmia   . GERD (gastroesophageal reflux disease)   . Heart murmur   . History of hiatal hernia   . Hyperlipidemia   . Hypertension   . Hypothyroidism   . PONV (postoperative nausea and vomiting)   . Sleep apnea   . Stroke ([place])   . Tobacco user      Allergies  Allergen Reactions  . Cardizem Cd  [Diltiazem Hcl Er Beads] Palpitations    "Makes heart skip"     Current Outpatient Prescriptions  Medication Sig Dispense Refill  . acetaminophen (TYLENOL) 325 MG tablet Take 2 tablets (650 mg total) by mouth every 6 (six) hours as needed for mild pain.    Marland Kitchen amLODipine (NORVASC) 5 MG tablet Take 5 mg by mouth daily.    Marland Kitchen aspirin 81 MG chewable tablet Chew 1 tablet (81 mg total) by mouth daily.    Marland Kitchen atorvastatin (LIPITOR) 40 MG tablet Take 1 tablet (40 mg total) by mouth daily. 30 tablet 1  . clopidogrel (PLAVIX) 75 MG tablet Take 1 tablet (75 mg total) by mouth daily. 30 tablet 1  . ergocalciferol (VITAMIN D2) 50000 UNITS capsule Take 50,000 Units by mouth every Sunday.     . furosemide (LASIX) 20 MG tablet Take 1 tablet (20 mg total) by mouth daily as needed. 30 tablet 3  . levothyroxine (SYNTHROID, LEVOTHROID) 25 MCG tablet Take 25 mcg by mouth daily before breakfast.    . lisinopril (PRINIVIL,ZESTRIL) 10 MG tablet Take 1 tablet (10 mg total) by mouth daily. 30 tablet 6  . metoprolol succinate (TOPROL-XL) 25 MG 24 hr tablet Take 25 mg by mouth  daily.    . Omega-3 Fatty Acids (RA FISH OIL) 1000 MG CAPS Take 2,000 mg by mouth 2 (two) times daily.     Marland Kitchen omeprazole (PRILOSEC) 20 MG capsule Take 40 mg by mouth daily.      No current facility-administered medications for this visit.      Past Surgical History:  Procedure Laterality Date  . APPENDECTOMY    . CARDIAC CATHETERIZATION    . CORONARY ARTERY BYPASS GRAFT  06/14/2006   x 5  . CORONARY ARTERY BYPASS GRAFT N/A 09/19/2016   Procedure: REDO CORONARY ARTERY BYPASS GRAFTING (CABG) x two, using right internal mammary artery and left radial artery;  Surgeon: [name], MD;  Location: [place];  Service: Open Heart Surgery;  Laterality: N/A;  . FOOT SURGERY    . HERNIA REPAIR    . LEFT HEART CATH AND CORS/GRAFTS ANGIOGRAPHY N/A 09/14/2016   Procedure: Left Heart Cath and Cors/Grafts Angiography;  Surgeon: Troy Sine, MD;  Location:  [place] [facility];  Service: Cardiovascular;  Laterality: N/A;  . LEFT HEART CATHETERIZATION WITH CORONARY/GRAFT ANGIOGRAM N/A 01/31/2014   Procedure: LEFT HEART CATHETERIZATION WITH Beatrix Fetters;  Surgeon: Sanda Klein, MD;  Location: [place] CATH LAB;  Service: Cardiovascular;  Laterality: N/A;  . NOSE SURGERY    . PERCUTANEOUS CORONARY STENT INTERVENTION (PCI-S)  01/31/2014   Procedure: PERCUTANEOUS CORONARY STENT INTERVENTION (PCI-S);  Surgeon: Sanda Klein, MD;  Location: [facility] CATH LAB;  Service: Cardiovascular;;  . RADIAL ARTERY HARVEST Left 09/19/2016   Procedure: RADIAL ARTERY HARVEST;  Surgeon: [name], MD;  Location: [place];  Service: Open Heart Surgery;  Laterality: Left;  . TEE WITHOUT CARDIOVERSION N/A 09/19/2016   Procedure: TRANSESOPHAGEAL ECHOCARDIOGRAM (TEE);  Surgeon: [name], MD;  Location: [place];  Service: Open Heart Surgery;  Laterality: N/A;     Allergies  Allergen Reactions  . Cardizem Cd [Diltiazem Hcl Er Beads] Palpitations    "Makes heart skip"      Family History  Problem Relation Age of Onset  . Heart attack Mother   . Heart attack Brother      Social History Mr. Knoblock reports that he quit smoking about 2 months ago. His smoking use included Cigarettes. He started smoking about 57 years ago. He has a 40.00 pack-year smoking history. He has never used smokeless tobacco. Mr. Talent reports that he does not drink alcohol.   Review of Systems CONSTITUTIONAL: No weight loss, fever, chills, weakness or fatigue.  HEENT: Eyes: No visual loss, blurred vision, double vision or yellow sclerae.No hearing loss, sneezing, congestion, runny nose or sore throat.  SKIN: No rash or itching.  CARDIOVASCULAR: per hpi RESPIRATORY: No shortness of breath, cough or sputum.  GASTROINTESTINAL: No anorexia, nausea, vomiting or diarrhea. No abdominal pain or blood.  GENITOURINARY: No burning on urination, no polyuria NEUROLOGICAL: No headache, dizziness,  syncope, paralysis, ataxia, numbness or tingling in the extremities. No change in bowel or bladder control.  MUSCULOSKELETAL: No muscle, back pain, joint pain or stiffness.  LYMPHATICS: No enlarged nodes. No history of splenectomy.  PSYCHIATRIC: No history of depression or anxiety.  ENDOCRINOLOGIC: No reports of sweating, cold or heat intolerance. No polyuria or polydipsia.  Marland Kitchen   Physical Examination Vitals:   11/30/16 1537  BP: (!) 164/59  Pulse: 62   Vitals:   11/30/16 1537  Weight: 223 lb (101.2 kg)  Height: 5\' 9"  (1.753 m)    Gen: resting comfortably, no acute distress HEENT: no scleral  icterus, pupils equal round and reactive, no palptable cervical adenopathy,  CV: RRR, no m/r/g, elevated JVD Resp: bilateral crackles GI: abdomen is soft, non-tender, non-distended, normal bowel sounds, no hepatosplenomegaly MSK: extremities are warm, 1-2+ bilateral LE edema.  Skin: warm, no rash Neuro:  no focal deficits Psych: appropriate affect   Diagnostic Studies 01/31/14 Cath Angiographic Findings: 1. The left main coronary arteryis not stenotic, but exhibits significant atherosclerosis and bifurcates in the usual fashion into the left anterior descending artery and left circumflex coronary artery.  2. The left anterior descending arteryis a large vessel that reaches the apex and generates several proximal septals, but is occluded before any of the major diagonal branches. There is evidence of extensive luminal irregularities and mild calcification.  3. The left circumflex coronary arteryis a medium-size vessel non dominant vessel that generates two major oblique marginal arteries. There is evidence of extensive luminal irregularities and mild calcification. No hemodynamically meaningful stenoses are seen. The first OM is flush occluded. The distal OM is medium in size.  4. The right coronary arteryis a very large-size dominant vessel that generates a posterior lateral ventricular  artery that is proximally occluded as well as a diffusely diseased PDA. There is evidence of extensive luminal irregularities and mild calcification.  5. The sequential SVG to the diagonal and first OMis very irregular in caliber, but widely patent, without stenoses. The first diagonal artery is severely diseased and small in caliber. The OM branch is large and free of major stenoses.  6. The SVG to the second diagonalis widely patent and healthy, feeding a relatively large vessel.  7. The LIMA to the LADis widely patent with both retrograde and antegrade LAD flow. There are significant stenoses both upstream and downstream of the anastomosis, but there is excellent flow.  8. The SVG to RCA-PLA has a severe, ulcerated stenosis in its distal third, at least 95% in severity, but with TIMI 3 flow. It is the culprit lesion.  5. The left ventriclewas not injected.There is no aortic valve stenosis by pullback. The left ventricular end-diastolic pressure is 24 mm Hg.   IMPRESSIONS: High grade unstable stenosis in the SVG to PLA causing NSTEMI  RECOMMENDATION: Urgent PCI, preferably with filter wire.   IMPRESSIONS: 1. Successful PCI of the SVG to right coronary artery graft with a 3.0 x 23 Alpine drug-eluting stent - dilated to 3.25 mm in diameter, using a distal embolic protection device, 4.0 spider. 2.         04/2014 Carotid US Moderate bilateral stenosis  12/2011 Abdominal US No aortic aneurysm   Jan 2013 MUGA LVEF 65%      08/2014 Echo VA LVEF 60-65%, aortic valve sclerosis.   cath at [facility] 03/18/16. LM 20%, prox LAD 100% CTO, D2 diffuse disease, LCX LIs, OM2 80%, RCA right PL segment 90%. LIMA patent, SVG-OM2 patent, SVG-D2 50% stenosis, SVG-right PL occluded. Recs were for medical management.  09/2016 cath  The left ventricular ejection fraction is 45-50% by visual estimate.  LV end diastolic pressure is normal.  There is mild left ventricular  systolic dysfunction.  Dist RCA lesion, 90 %stenosed.  SVG.  Dist Graft-1 lesion, 95 %stenosed.  Dist Graft-2 lesion, 50 %stenosed.  Prox Graft lesion, 80 %stenosed.  Ost 2nd Mrg lesion, 100 %stenosed.  SVG.  Mid Graft lesion, 100 %stenosed.  Prox Graft lesion, 100 %stenosed.  Seq SVG-.  Origin lesion, 99 %stenosed.  Prox LAD lesion, 100 %stenosed.  Prox Graft to Dist Graft lesion, 25 %stenosed.  Ost LAD to Prox LAD lesion, 80 %stenosed.  Multivessel native CAD with diffuse 80% proximal LAD stenosis and occlusion of the LAD after a small first diagonal vessel; occluded marginal branch arising from the mid AV groove circumflex with an otherwise patent AV groove circumflex extending distally; RCA with 20% mid narrowing normal PDA, but with 90% PLA stenosis.  Patent LIMA graft supplying the mid LAD.  SVG sequentially supplying the diagonal-1 and OM vessel with 99% near ostial graft stenosis and diffuse luminal irregularity in the body of the graft prior to the initial anastomosis.  SVG supplying the diagonal 2 vessel with diffuse proximal to mid graft stenosis, 95% ulcerated plaque stenosis in the distal third of the graft and 50% eccentric stenosis prior to its anastomosis.  SVG which had previously supplied the PLA and had been stented, is occluded proximally.  Mild-to-moderate LV dysfunction with mid distal anterolateral hypocontractility and an ejection fraction of 45-50%.  RECOMMENDATION: The patient had previously undergone CABG surgery by Dr. Cyndia Bent in 2008. Dr. Cyndia Bent will be re-consulted for evaluation and consideration of possible redo CABG revascularization surgery.   09/2016 echo Study Conclusions  - Left ventricle: The cavity size was normal. Wall thickness was increased in a pattern of mild LVH. Systolic function was normal. The estimated ejection fraction was in the range of 50% to 55%. Abnormal GLPSS at -15%, inferoseptal strain  abnormality. Basal to mid inferior/inferoseptal hypokinesis. Doppler parameters are consistent with abnormal left ventricular relaxation (grade 1 diastolic dysfunction). The E/e&' ratio is between 8-15, suggesting indeterminate LV filling pressure. - Aortic valve: Poorly visualized. Mild stenosis. Mildly calcified leaflets. There was mild regurgitation. Mean gradient (S): 13 mm Hg. Peak gradient (S): 28 mm Hg. - Left atrium: The atrium was normal in size. - Inferior vena cava: The vessel was normal in size. The respirophasic diameter changes were in the normal range (>= 50%), consistent with normal central venous pressure.  Impressions:  - LVEF 50-55%, mild LVH, basal to mid inferior, inferoseptal hypokinesis, abnormal GLPSS at -15%, grade 1 DD with indeterminate LV filling pressure, mild calcific aortic stenosis - mean gradient of 13 mmHg, normal LA size, normal IVC.  09/2016 Carotid US/ABI preCABG Summary:  1. Carotid Duplex Evaluation - Right internal carotid artery 1-39% stenosis, left internal carotid artery 40-59% stenosis. 2. Palmar Arch Evaluation - Right radial artery decreased greater than 50 percent with compression and right ulnar remains within normal limits with compression. . Left Doppler waveforms remain within normal limits with radial and ulnar compression. 3. ABI Evaluation - Right ABI appeared normal at rest. Left ABI is suggestive of moderate arterial disease at rest.     Assessment and Plan  1.Acute diastolic HF - worst swelling/weight gain patient has ever had - we will increase lasix to 60mg  daily - repeat echo given acute clinical change - check TSH due to recent amio use - update [place] on weights on MOnday - recheck BMET/Mg in 2 weeks on high lasix dose  2. HTN - increase norvasc to 10mg  daily - follow with increased diuresis.    F/u 3-4 weeks  Arnoldo Lenis, M.D .

## 2016-11-30 NOTE — Telephone Encounter (Signed)
Schedule in [facility] Dec 29, 2016   ECHO  Maybe scheduled sooner

## 2016-12-01 ENCOUNTER — Other Ambulatory Visit: Payer: Self-pay

## 2016-12-01 ENCOUNTER — Other Ambulatory Visit: Payer: Self-pay | Admitting: Cardiology

## 2016-12-01 MED ORDER — FUROSEMIDE 40 MG PO TABS: 60.0000 mg | ORAL_TABLET | Freq: Every day | ORAL | 3 refills | Status: DC

## 2016-12-01 NOTE — Telephone Encounter (Signed)
Prescription refilled.

## 2016-12-01 NOTE — Telephone Encounter (Signed)
[name] needs to have his Lasix 60 mg called to [facility]. Home #   [phone]

## 2016-12-05 ENCOUNTER — Telehealth: Payer: Self-pay | Admitting: Cardiology

## 2016-12-05 MED ORDER — FUROSEMIDE 40 MG PO TABS: 40.0000 mg | ORAL_TABLET | Freq: Every day | ORAL | 3 refills | Status: DC

## 2016-12-05 MED ORDER — FUROSEMIDE 20 MG PO TABS: 60.0000 mg | ORAL_TABLET | Freq: Every day | ORAL | 3 refills | Status: DC

## 2016-12-05 NOTE — Telephone Encounter (Signed)
Patient called to report his weights. 12/01/16   222.8 12/02/16   218.6 12/03/16   219 12/04/16   219.6 12/05/16   218.4 [name] states that he continues to have swelling in his feet and legs.  If he is to stay on Lasix he will Need [facility]   [place]. K[place]

## 2016-12-05 NOTE — Telephone Encounter (Signed)
Patient notified. [facility]

## 2016-12-05 NOTE — Telephone Encounter (Signed)
Weights moving in right diection however still with swelling. Have him increase lasix to 60mg  in AM and 40mg  in PM. Update [place] on weights again on Friday please   J Sharlyn Odonnel MD

## 2016-12-09 ENCOUNTER — Telehealth: Payer: Self-pay | Admitting: *Deleted

## 2016-12-09 NOTE — Telephone Encounter (Signed)
12/06/16 WEIGHT 217.2 LBS 12/07/16 WEIGHT 213.4 LBS 12/08/16 WEIGHT 213.6 LBS 12/09/16 WEIGHT 213.0 LBS SWELLING HAS IMPROVED ABOUT 50 % BUT STILL THERE TAKING FUROSEMIDE 60 MG IN THE MORNING AND 40 MG IN THE EVENING

## 2016-12-12 ENCOUNTER — Encounter: Payer: Self-pay | Admitting: *Deleted

## 2016-12-12 NOTE — Telephone Encounter (Signed)
Continue lasix 60mg  in AM and 40mg  in PM. Did he have his blood tests done, we will really need to see his kidney function this week  J Ivonne Freeburg MD

## 2016-12-12 NOTE — Telephone Encounter (Signed)
LM to return call.

## 2016-12-12 NOTE — Telephone Encounter (Signed)
Pt says he had labs done this morning at [facility] clinic in [place] - will request results

## 2016-12-14 NOTE — Telephone Encounter (Signed)
Do we have these labs?   Zandra Abts MD

## 2016-12-14 NOTE — Telephone Encounter (Signed)
Yes sent to be scanned and routed to you

## 2016-12-15 ENCOUNTER — Telehealth: Payer: Self-pay | Admitting: *Deleted

## 2016-12-15 MED ORDER — POTASSIUM CHLORIDE CRYS ER 20 MEQ PO TBCR: EXTENDED_RELEASE_TABLET | ORAL | 1 refills | Status: DC

## 2016-12-15 MED ORDER — MAGNESIUM 400 MG PO CAPS
ORAL_CAPSULE | ORAL | 1 refills | Status: DC
Start: 2016-12-15 — End: 2017-01-04

## 2016-12-15 NOTE — Telephone Encounter (Signed)
Pt voiced understanding - verified that pt was not taking potassium - says weights have been down and have been the same - says swelling is better down half from what it was. [facility]

## 2016-12-15 NOTE — Telephone Encounter (Signed)
-----   Message from Arnoldo Lenis, MD sent at 12/15/2016 12:08 PM EDT ----- Labs show his potassium and magnesium are a little low. Kidney function is overall stable. Verify he has not been on any potassium supplement from the [place], if not start 20 mEq bid x 2 days then 26mEq daily and then start magnesium oxide 400mg  bid x 2 days then 400mg  daily. How are his weights and swelling doing?  Zandra Abts MD

## 2016-12-15 NOTE — Telephone Encounter (Signed)
If still has swelling I would continue his lasix 60mg  in AM and 40mg  in PM. Have him update [place] on weights on Monday. Eventually we likely will have to cut down his dose of lasix again.  J Aaliyah Gavel MD

## 2016-12-15 NOTE — Telephone Encounter (Signed)
Pt aware - will update [place] on weights Monday

## 2016-12-19 ENCOUNTER — Telehealth: Payer: Self-pay

## 2016-12-19 DIAGNOSIS — Z7982 Long term (current) use of aspirin: Secondary | ICD-10-CM | POA: Diagnosis not present

## 2016-12-19 DIAGNOSIS — K219 Gastro-esophageal reflux disease without esophagitis: Secondary | ICD-10-CM | POA: Diagnosis not present

## 2016-12-19 DIAGNOSIS — D649 Anemia, unspecified: Secondary | ICD-10-CM | POA: Diagnosis not present

## 2016-12-19 DIAGNOSIS — I259 Chronic ischemic heart disease, unspecified: Secondary | ICD-10-CM | POA: Diagnosis not present

## 2016-12-19 DIAGNOSIS — N289 Disorder of kidney and ureter, unspecified: Secondary | ICD-10-CM | POA: Diagnosis not present

## 2016-12-19 DIAGNOSIS — R51 Headache: Secondary | ICD-10-CM | POA: Diagnosis not present

## 2016-12-19 DIAGNOSIS — Z951 Presence of aortocoronary bypass graft: Secondary | ICD-10-CM | POA: Diagnosis not present

## 2016-12-19 DIAGNOSIS — Z79899 Other long term (current) drug therapy: Secondary | ICD-10-CM | POA: Diagnosis not present

## 2016-12-19 DIAGNOSIS — Z8673 Personal history of transient ischemic attack (TIA), and cerebral infarction without residual deficits: Secondary | ICD-10-CM | POA: Diagnosis not present

## 2016-12-19 DIAGNOSIS — E039 Hypothyroidism, unspecified: Secondary | ICD-10-CM | POA: Diagnosis not present

## 2016-12-19 DIAGNOSIS — Z87891 Personal history of nicotine dependence: Secondary | ICD-10-CM | POA: Diagnosis not present

## 2016-12-19 DIAGNOSIS — M6281 Muscle weakness (generalized): Secondary | ICD-10-CM | POA: Diagnosis not present

## 2016-12-19 DIAGNOSIS — Z7902 Long term (current) use of antithrombotics/antiplatelets: Secondary | ICD-10-CM | POA: Diagnosis not present

## 2016-12-19 DIAGNOSIS — I252 Old myocardial infarction: Secondary | ICD-10-CM | POA: Diagnosis not present

## 2016-12-19 DIAGNOSIS — Z8551 Personal history of malignant neoplasm of bladder: Secondary | ICD-10-CM | POA: Diagnosis not present

## 2016-12-19 DIAGNOSIS — E785 Hyperlipidemia, unspecified: Secondary | ICD-10-CM | POA: Diagnosis not present

## 2016-12-19 DIAGNOSIS — I10 Essential (primary) hypertension: Secondary | ICD-10-CM | POA: Diagnosis not present

## 2016-12-19 DIAGNOSIS — G459 Transient cerebral ischemic attack, unspecified: Secondary | ICD-10-CM | POA: Diagnosis not present

## 2016-12-19 NOTE — Telephone Encounter (Signed)
Patient called to report weights   8/3 212 lbs 8/4 210 lbs 8/5 212.3 lbs 8/6 213.6 lbs

## 2016-12-19 NOTE — Telephone Encounter (Signed)
Patient states his Left leg and foot still swell daily but no where near the amount they were. Patient is taking Lasix 60 mg in the morning and 40 mg in the evening

## 2016-12-19 NOTE — Telephone Encounter (Signed)
From our records he has lost around 10 lbs of fluid. How is his swelling doing. Looking back this does not look to be too far off from his weight previous to building up all the extra fluid. Clarify how he has been taking his lasix recently. Given his prior kidney I would be hesistant to be more aggressive with fluid pills  Zandra Abts MD

## 2016-12-20 DIAGNOSIS — R51 Headache: Secondary | ICD-10-CM | POA: Diagnosis not present

## 2016-12-20 NOTE — Telephone Encounter (Signed)
He appears to be improving on weight and symptoms of edema. He is on amlodipine that can cause some dependent edema. Keeping legs elevated when not walking will help. What is BP? If low, may be able to decrease amlodipine dose. Avoiding salt is important too.

## 2016-12-21 NOTE — Telephone Encounter (Signed)
LMTCB

## 2016-12-21 NOTE — Telephone Encounter (Signed)
Patient still taking amlodipine 10 mg daily and BP today was 138/68. Advised patient to avoid salt and keep legs elevated as much as possible. Patient verbalized understanding.

## 2016-12-22 NOTE — Telephone Encounter (Signed)
Good BP. Can adjust medications when seen on follow up if necessary.

## 2016-12-29 ENCOUNTER — Ambulatory Visit (INDEPENDENT_AMBULATORY_CARE_PROVIDER_SITE_OTHER): Payer: Medicare Other

## 2016-12-29 ENCOUNTER — Other Ambulatory Visit: Payer: Self-pay

## 2016-12-29 DIAGNOSIS — R0602 Shortness of breath: Secondary | ICD-10-CM

## 2016-12-29 LAB — ECHOCARDIOGRAM COMPLETE
AO mean calculated velocity dopler: 160 cm/s
AV Area VTI index: 1.13 cm2/m2
AV Area VTI: 2.46 cm2
AV Area mean vel: 2.46 cm2
AV Mean grad: 12 mmHg
AV Peak grad: 24 mmHg
AV VEL mean LVOT/AV: 0.5
AV area mean vel ind: 1.14 cm2/m2
AV peak Index: 1.14
AV pk vel: 242 cm/s
AV vel: 2.44
Ao pk vel: 0.5 m/s
E decel time: 259 ms
E/e' ratio: 11.91
FS: 52 % — AB (ref 28–44)
IVS/LV PW RATIO, ED: 0.95
LA ID, A-P, ES: 44 mm
LA diam end sys: 44 mm
LA diam index: 2.04 cm/m2
LA vol A4C: 66.1 mL
LA vol index: 33.9 mL/m2
LA vol: 73.3 mL
LV E/e' medial: 11.91
LV E/e'average: 11.91
LV PW d: 10.2 mm — AB (ref 0.6–1.1)
LV dias vol index: 52 mL/m2
LV dias vol: 112 mL (ref 62–150)
LV e' LATERAL: 9.57 cm/s
LV sys vol index: 12 mL/m2
LV sys vol: 26 mL (ref 21–61)
LVOT SV: 155 mL
LVOT VTI: 31.6 cm
LVOT area: 4.91 cm2
LVOT diameter: 25 mm
LVOT peak grad rest: 6 mmHg
LVOT peak vel: 121 cm/s
Lateral S' vel: 12.5 cm/s
MV Dec: 259
MV Peak grad: 5 mmHg
MV pk E vel: 114 m/s
P 1/2 time: 1011 ms
RV sys press: 28 mmHg
Reg peak vel: 250 cm/s
Simpson's disk: 77
Stroke v: 86 mL
TAPSE: 21.4 mm
TDI e' lateral: 9.57
TDI e' medial: 7.83
TR max vel: 250 cm/s
Valve area index: 1.13
Valve area: 2.44 cm2

## 2017-01-02 ENCOUNTER — Telehealth: Payer: Self-pay | Admitting: *Deleted

## 2017-01-02 NOTE — Telephone Encounter (Signed)
-----   Message from Levonne Hubert, LPN sent at 05/18/1115  4:11 PM EDT -----   ----- Message ----- From: Lendon Colonel, NP Sent: 12/30/2016  12:35 PM To: Marikay Alar Pinnix, LPN  Pumping function of the heart is normal with mild valve regurgitation which is not concerning. No changes in his regimen.

## 2017-01-02 NOTE — Telephone Encounter (Signed)
Pt aware - routed to pcp  

## 2017-01-04 ENCOUNTER — Encounter: Payer: Self-pay | Admitting: Cardiology

## 2017-01-04 ENCOUNTER — Encounter: Payer: Self-pay | Admitting: *Deleted

## 2017-01-04 ENCOUNTER — Ambulatory Visit (INDEPENDENT_AMBULATORY_CARE_PROVIDER_SITE_OTHER): Payer: Medicare Other | Admitting: Cardiology

## 2017-01-04 VITALS — BP 148/70 | HR 57 | Ht 69.0 in | Wt 222.0 lb

## 2017-01-04 DIAGNOSIS — I5031 Acute diastolic (congestive) heart failure: Secondary | ICD-10-CM

## 2017-01-04 MED ORDER — FUROSEMIDE 40 MG PO TABS: ORAL_TABLET | ORAL | 3 refills | Status: DC

## 2017-01-04 NOTE — Patient Instructions (Signed)
Your physician recommends that you schedule a follow-up appointment in: 2 [place] has recommended you make the following change in your medication:   RESTART LASIX 60 MG (1 & 1/2 TABLETS) IN THE MORNING AND 40 MG IN THE EVENING   Your physician recommends that you return for lab work in: [place] BMP/MG  Your physician recommends that you weigh, daily, at the same time every day, and in the same amount of clothing FOR 1 WEEK. Please record your daily weights on the handout provided and bring it to your next appointment.  Thank you for choosing [facility]!!

## 2017-01-04 NOTE — Progress Notes (Signed)
Clinical Summary [name] is a 71 y.o.male seen today for a focused visit on history of acute diastolic HF and recent volume overload.   1. Acute diastolic HF - since discharge no recent chest pain. Significant progression of LE edema and SOB over the last few days. WEight up roughly 10 lbs.  - he reports this is the worst swelling he has ever had - has been compliant with diuretic. Taking 40mg  daily has not helped.  - of note he was transiently on amiodarone postop recent CABG, needs TSH check in setting of leg edema.  - swelling did not improve with lower dose of norvasc. Had been on norvasc before without troubles, with this degree of weight gain does not appear to be the issue.   - since last visit we increased lasix to 60mg  in AM and 40mg  in PM - since last visit repeat echo 12/2016 showed LVEF 65-70%, grade Ii diastolic dysfunction - home weights down as low as 210 lbs in early August.  - home weights trending back up, poor diet he reports. Last home weight at 218 lbs. Has not taking lasix x 1 week     Past Medical History:  Diagnosis Date  . Acute myocardial infarction of other inferior wall, initial episode of care   . Anginal pain ([name])   . Anxiety   . Arthritis   . [facility])    bladder 2013 removed, has cystoscopy 10/2016, has returned x 2  . COPD (chronic obstructive pulmonary disease) ([place])   . Coronary artery disease    Artery bypass graft Jan 2008  . Dyspnea   . Dysrhythmia   . GERD (gastroesophageal reflux disease)   . Heart murmur   . History of hiatal hernia   . Hyperlipidemia   . Hypertension   . Hypothyroidism   . PONV (postoperative nausea and vomiting)   . Sleep apnea   . Stroke ([place])   . Tobacco user      Allergies  Allergen Reactions  . Cardizem Cd [Diltiazem Hcl Er Beads] Palpitations    "Makes heart skip"     Current Outpatient Prescriptions  Medication Sig Dispense Refill  . acetaminophen (TYLENOL) 325 MG tablet Take 2  tablets (650 mg total) by mouth every 6 (six) hours as needed for mild pain.    Marland Kitchen amLODipine (NORVASC) 10 MG tablet Take 1 tablet (10 mg total) by mouth daily.    Marland Kitchen aspirin 81 MG chewable tablet Chew 1 tablet (81 mg total) by mouth daily.    Marland Kitchen atorvastatin (LIPITOR) 40 MG tablet Take 1 tablet (40 mg total) by mouth daily. 30 tablet 1  . clopidogrel (PLAVIX) 75 MG tablet Take 1 tablet (75 mg total) by mouth daily. 30 tablet 1  . ergocalciferol (VITAMIN D2) 50000 UNITS capsule Take 50,000 Units by mouth every Sunday.     . furosemide (LASIX) 40 MG tablet Take 1 tablet (40 mg total) by mouth daily. Take 60 mg in the morning and 40 mg in the evening 45 tablet 3  . levothyroxine (SYNTHROID, LEVOTHROID) 25 MCG tablet Take 25 mcg by mouth daily before breakfast.    . lisinopril (PRINIVIL,ZESTRIL) 10 MG tablet Take 1 tablet (10 mg total) by mouth daily. 30 tablet 6  . Magnesium 400 MG CAPS TAKE 1 TABLET TWICE DAILY FOR 2 DAYS THEN TAKE 1 TABLET DAILY 92 capsule 1  . metoprolol succinate (TOPROL-XL) 25 MG 24 hr tablet Take 25 mg by mouth daily.    Marland Kitchen  Omega-3 Fatty Acids (RA FISH OIL) 1000 MG CAPS Take 2,000 mg by mouth 2 (two) times daily.     Marland Kitchen omeprazole (PRILOSEC) 20 MG capsule Take 40 mg by mouth daily.     . potassium chloride SA (K-DUR,KLOR-CON) 20 MEQ tablet TAKE 1 TABLET TWICE DAILY FOR 2 DAYS THEN TAKE 1 TABLET DAILY 92 tablet 1   No current facility-administered medications for this visit.      Past Surgical History:  Procedure Laterality Date  . APPENDECTOMY    . CARDIAC CATHETERIZATION    . CORONARY ARTERY BYPASS GRAFT  06/14/2006   x 5  . CORONARY ARTERY BYPASS GRAFT N/A 09/19/2016   Procedure: REDO CORONARY ARTERY BYPASS GRAFTING (CABG) x two, using right internal mammary artery and left radial artery;  Surgeon: [name], MD;  Location: [facility];  Service: Open Heart Surgery;  Laterality: N/A;  . FOOT SURGERY    . HERNIA REPAIR    . LEFT HEART CATH AND CORS/GRAFTS ANGIOGRAPHY N/A  09/14/2016   Procedure: Left Heart Cath and Cors/Grafts Angiography;  Surgeon: Troy Sine, MD;  Location: [facility] CV LAB;  Service: Cardiovascular;  Laterality: N/A;  . LEFT HEART CATHETERIZATION WITH CORONARY/GRAFT ANGIOGRAM N/A 01/31/2014   Procedure: LEFT HEART CATHETERIZATION WITH Beatrix Fetters;  Surgeon: Sanda Klein, MD;  Location: [facility] CATH LAB;  Service: Cardiovascular;  Laterality: N/A;  . NOSE SURGERY    . PERCUTANEOUS CORONARY STENT INTERVENTION (PCI-S)  01/31/2014   Procedure: PERCUTANEOUS CORONARY STENT INTERVENTION (PCI-S);  Surgeon: Sanda Klein, MD;  Location: [facility] CATH LAB;  Service: Cardiovascular;;  . RADIAL ARTERY HARVEST Left 09/19/2016   Procedure: RADIAL ARTERY HARVEST;  Surgeon: [name], MD;  Location: [facility];  Service: Open Heart Surgery;  Laterality: Left;  . TEE WITHOUT CARDIOVERSION N/A 09/19/2016   Procedure: TRANSESOPHAGEAL ECHOCARDIOGRAM (TEE);  Surgeon: [name], MD;  Location: [facility];  Service: Open Heart Surgery;  Laterality: N/A;     Allergies  Allergen Reactions  . Cardizem Cd [Diltiazem Hcl Er Beads] Palpitations    "Makes heart skip"      Family History  Problem Relation Age of Onset  . Heart attack Mother   . Heart attack Brother      Social History Mr. Stamos reports that he quit smoking about 3 months ago. His smoking use included Cigarettes. He started smoking about 57 years ago. He has a 40.00 pack-year smoking history. He has never used smokeless tobacco. Mr. Dill reports that he does not drink alcohol.   Review of Systems CONSTITUTIONAL: No weight loss, fever, chills, weakness or fatigue.  HEENT: Eyes: No visual loss, blurred vision, double vision or yellow sclerae.No hearing loss, sneezing, congestion, runny nose or sore throat.  SKIN: No rash or itching.  CARDIOVASCULAR: no chest pain, no palpitatoins.  RESPIRATORY: No shortness of breath, cough or sputum.  GASTROINTESTINAL: No anorexia, nausea, vomiting  or diarrhea. No abdominal pain or blood.  GENITOURINARY: No burning on urination, no polyuria NEUROLOGICAL: No headache, dizziness, syncope, paralysis, ataxia, numbness or tingling in the extremities. No change in bowel or bladder control.  MUSCULOSKELETAL: No muscle, back pain, joint pain or stiffness.  LYMPHATICS: No enlarged nodes. No history of splenectomy.  PSYCHIATRIC: No history of depression or anxiety.  ENDOCRINOLOGIC: No reports of sweating, cold or heat intolerance. No polyuria or polydipsia.  Marland Kitchen   Physical Examination Vitals:   01/04/17 0952  BP: (!) 148/70  Pulse: (!) 57  SpO2: 98%   Vitals:  01/04/17 0952  Weight: 222 lb (100.7 kg)  Height: 5\' 9"  (1.753 m)    Gen: resting comfortably, no acute distress HEENT: no scleral icterus, pupils equal round and reactive, no palptable cervical adenopathy,  CV: RRR, 2/6 systolic murmur rusb, no jvd Resp: Clear to auscultation bilaterally GI: abdomen is soft, non-tender, non-distended, normal bowel sounds, no hepatosplenomegaly MSK: extremities are warm, 1+ bilateral LE edema Skin: warm, no rash Neuro:  no focal deficits Psych: appropriate affect   Diagnostic Studies  01/31/14 Cath Angiographic Findings: 1. The left main coronary arteryis not stenotic, but exhibits significant atherosclerosis and bifurcates in the usual fashion into the left anterior descending artery and left circumflex coronary artery.  2. The left anterior descending arteryis a large vessel that reaches the apex and generates several proximal septals, but is occluded before any of the major diagonal branches. There is evidence of extensive luminal irregularities and mild calcification.  3. The left circumflex coronary arteryis a medium-size vessel non dominant vessel that generates two major oblique marginal arteries. There is evidence of extensive luminal irregularities and mild calcification. No hemodynamically meaningful stenoses are seen. The  first OM is flush occluded. The distal OM is medium in size.  4. The right coronary arteryis a very large-size dominant vessel that generates a posterior lateral ventricular artery that is proximally occluded as well as a diffusely diseased PDA. There is evidence of extensive luminal irregularities and mild calcification.  5. The sequential SVG to the diagonal and first OMis very irregular in caliber, but widely patent, without stenoses. The first diagonal artery is severely diseased and small in caliber. The OM Niall Illes is large and free of major stenoses.  6. The SVG to the second diagonalis widely patent and healthy, feeding a relatively large vessel.  7. The LIMA to the LADis widely patent with both retrograde and antegrade LAD flow. There are significant stenoses both upstream and downstream of the anastomosis, but there is excellent flow.  8. The SVG to RCA-PLA has a severe, ulcerated stenosis in its distal third, at least 95% in severity, but with TIMI 3 flow. It is the culprit lesion.  5. The left ventriclewas not injected.There is no aortic valve stenosis by pullback. The left ventricular end-diastolic pressure is 24 mm Hg.   IMPRESSIONS: High grade unstable stenosis in the SVG to PLA causing NSTEMI  RECOMMENDATION: Urgent PCI, preferably with filter wire.   IMPRESSIONS: 1. Successful PCI of the SVG to right coronary artery graft with a 3.0 x 23 Alpine drug-eluting stent - dilated to 3.25 mm in diameter, using a distal embolic protection device, 4.0 spider. 2.         04/2014 Carotid US Moderate bilateral stenosis  12/2011 Abdominal US No aortic aneurysm   Jan 2013 MUGA LVEF 65%      08/2014 Echo VA LVEF 60-65%, aortic valve sclerosis.   cath at [facility] 03/18/16. LM 20%, prox LAD 100% CTO, D2 diffuse disease, LCX LIs, OM2 80%, RCA right PL segment 90%. LIMA patent, SVG-OM2 patent, SVG-D2 50% stenosis, SVG-right PL occluded. Recs were for medical  management.  09/2016 cath  The left ventricular ejection fraction is 45-50% by visual estimate.  LV end diastolic pressure is normal.  There is mild left ventricular systolic dysfunction.  Dist RCA lesion, 90 %stenosed.  SVG.  Dist Graft-1 lesion, 95 %stenosed.  Dist Graft-2 lesion, 50 %stenosed.  Prox Graft lesion, 80 %stenosed.  Ost 2nd Mrg lesion, 100 %stenosed.  SVG.  Mid Graft lesion, 100 %  stenosed.  Prox Graft lesion, 100 %stenosed.  Seq SVG-.  Origin lesion, 99 %stenosed.  Prox LAD lesion, 100 %stenosed.  Prox Graft to Dist Graft lesion, 25 %stenosed.  Ost LAD to Prox LAD lesion, 80 %stenosed.  Multivessel native CAD with diffuse 80% proximal LAD stenosis and occlusion of the LAD after a small first diagonal vessel; occluded marginal T[place]a Stencil arising from the mid AV groove circumflex with an otherwise patent AV groove circumflex extending distally; RCA with 20% mid narrowing normal PDA, but with 90% PLA stenosis.  Patent LIMA graft supplying the mid LAD.  SVG sequentially supplying the diagonal-1 and OM vessel with 99% near ostial graft stenosis and diffuse luminal irregularity in the body of the graft prior to the initial anastomosis.  SVG supplying the diagonal 2 vessel with diffuse proximal to mid graft stenosis, 95% ulcerated plaque stenosis in the distal third of the graft and 50% eccentric stenosis prior to its anastomosis.  SVG which had previously supplied the PLA and had been stented, is occluded proximally.  Mild-to-moderate LV dysfunction with mid distal anterolateral hypocontractility and an ejection fraction of 45-50%.  RECOMMENDATION: The patient had previously undergone CABG surgery by Dr. Cyndia Bent in 2008. Dr. Cyndia Bent will be re-consulted for evaluation and consideration of possible redo CABG revascularization surgery.   09/2016 echo Study Conclusions  - Left ventricle: The cavity size was normal. Wall thickness was increased in  a pattern of mild LVH. Systolic function was normal. The estimated ejection fraction was in the range of 50% to 55%. Abnormal GLPSS at -15%, inferoseptal strain abnormality. Basal to mid inferior/inferoseptal hypokinesis. Doppler parameters are consistent with abnormal left ventricular relaxation (grade 1 diastolic dysfunction). The E/e&' ratio is between 8-15, suggesting indeterminate LV filling pressure. - Aortic valve: Poorly visualized. Mild stenosis. Mildly calcified leaflets. There was mild regurgitation. Mean gradient (S): 13 mm Hg. Peak gradient (S): 28 mm Hg. - Left atrium: The atrium was normal in size. - Inferior vena cava: The vessel was normal in size. The respirophasic diameter changes were in the normal range (>= 50%), consistent with normal central venous pressure.  Impressions:  - LVEF 50-55%, mild LVH, basal to mid inferior, inferoseptal hypokinesis, abnormal GLPSS at -15%, grade 1 DD with indeterminate LV filling pressure, mild calcific aortic stenosis - mean gradient of 13 mmHg, normal LA size, normal IVC.  09/2016 Carotid US/ABI preCABG Summary:  1. Carotid Duplex Evaluation - Right internal carotid artery 1-39% stenosis, left internal carotid artery 40-59% stenosis. 2. Palmar Arch Evaluation - Right radial artery decreased greater than 50 percent with compression and right ulnar remains within normal limits with compression. . Left Doppler waveforms remain within normal limits with radial and ulnar compression. 3. ABI Evaluation - Right ABI appeared normal at rest. Left ABI is suggestive of moderate arterial disease at rest.    12/2016 echo Study Conclusions  - Left ventricle: The cavity size was normal. Wall thickness was   normal. Systolic function was vigorous. The estimated ejection   fraction was in the range of 65% to 70%. Wall motion was normal;   there were no regional wall motion abnormalities. -  Aortic valve: Moderately calcified annulus. Trileaflet; mildly   calcified leaflets. There was mild regurgitation. Mean gradient   (S): 12 mm Hg. Peak gradient (S): 24 mm Hg. Valve area (VTI):   2.44 cm^2. - Mitral valve: Mildly calcified annulus. There was trivial   regurgitation. - Left atrium: The atrium was mildly dilated. - Right atrium: Central venous pressure (  est): 3 mm Hg. - Atrial septum: No defect or patent foramen ovale was identified. - Tricuspid valve: There was trivial regurgitation. - Pulmonary arteries: PA peak pressure: 28 mm Hg (S). - Pericardium, extracardiac: There was no pericardial effusion.  Impressions:  - Normal LV wall thickness with LVEF 65-70% and grade 2 diastolic   dysfunction. Mild left atrial enlargement. Mildly calcified   mitral annulus with trivial mitral regurgitation. Moderately   sclerotic aortic valve with mild aortic regurgitation. Trivial   tricuspid regurgitation with PASP estimated 28 mmHg.    Assessment and Plan  1.Acute diastolic HF - weight trending back up with increased LE edema. He reports increased sodium intake, has not taken lasix x 1 week - resume lasix 60mg  in AM and 40mg  in PM. Repeat BMET/Mg in 2 weeks - call us in 1 week to update [place] on weights     Arnoldo Lenis, M.D.

## 2017-01-05 ENCOUNTER — Other Ambulatory Visit: Payer: Self-pay | Admitting: *Deleted

## 2017-01-05 MED ORDER — FUROSEMIDE 40 MG PO TABS: ORAL_TABLET | ORAL | 6 refills | Status: DC

## 2017-01-10 ENCOUNTER — Encounter (HOSPITAL_COMMUNITY)
Admission: RE | Admit: 2017-01-10 | Discharge: 2017-01-10 | Disposition: A | Discharge status: Home / Self Care | Payer: Non-veteran care | Source: Ambulatory Visit | Attending: Cardiology | Admitting: Cardiology

## 2017-01-10 VITALS — BP 122/52 | HR 62 | Ht 69.0 in | Wt 213.1 lb

## 2017-01-10 DIAGNOSIS — Z951 Presence of aortocoronary bypass graft: Secondary | ICD-10-CM | POA: Diagnosis not present

## 2017-01-10 NOTE — Progress Notes (Signed)
Cardiac Individual Treatment Plan  Patient Details  Name: DENZELL COLASANTI MRN: [id number] Date of Birth: 1945/07/23 Referring Provider:     CARDIAC REHAB PHASE II ORIENTATION from 01/10/2017 in [place]  Referring Provider  Dr. Harl Bowie      Initial Encounter Date:    CARDIAC REHAB PHASE II ORIENTATION from 01/10/2017 in [place]  Date  01/10/17  Referring Provider  Dr. Harl Bowie      Visit Diagnosis: S/P CABG x 2  Patient's Home Medications on Admission:  Current Outpatient Prescriptions:  .  PRESCRIPTION MEDICATION, 1 each. Cortisone Injection into each knee every 4 months., Disp: , Rfl:  .  acetaminophen (TYLENOL) 325 MG tablet, Take 2 tablets (650 mg total) by mouth every 6 (six) hours as needed for mild pain., Disp: , Rfl:  .  amLODipine (NORVASC) 10 MG tablet, Take 1 tablet (10 mg total) by mouth daily., Disp: , Rfl:  .  aspirin 81 MG chewable tablet, Chew 1 tablet (81 mg total) by mouth daily., Disp: , Rfl:  .  atorvastatin (LIPITOR) 40 MG tablet, Take 1 tablet (40 mg total) by mouth daily., Disp: 30 tablet, Rfl: 1 .  clopidogrel (PLAVIX) 75 MG tablet, Take 1 tablet (75 mg total) by mouth daily., Disp: 30 tablet, Rfl: 1 .  ergocalciferol (VITAMIN D2) 50000 UNITS capsule, Take 50,000 Units by mouth every Sunday. , Disp: , Rfl:  .  furosemide (LASIX) 40 MG tablet, Take 60 mg in the morning and 40 mg in the evening, Disp: 75 tablet, Rfl: 6 .  levothyroxine (SYNTHROID, LEVOTHROID) 25 MCG tablet, Take 25 mcg by mouth daily before breakfast., Disp: , Rfl:  .  lisinopril (PRINIVIL,ZESTRIL) 10 MG tablet, Take 1 tablet (10 mg total) by mouth daily., Disp: 30 tablet, Rfl: 6 .  magnesium oxide (MAG-OX) 400 MG tablet, Take 400 mg by mouth daily., Disp: , Rfl:  .  metoprolol succinate (TOPROL-XL) 25 MG 24 hr tablet, Take 25 mg by mouth daily., Disp: , Rfl:  .  Omega-3 Fatty Acids (RA FISH OIL) 1000 MG CAPS, Take 2,000 mg by mouth 2 (two) times  daily. , Disp: , Rfl:  .  omeprazole (PRILOSEC) 20 MG capsule, Take 40 mg by mouth daily. , Disp: , Rfl:  .  potassium chloride SA (K-DUR,KLOR-CON) 20 MEQ tablet, Take 20 mEq by mouth daily., Disp: , Rfl:   Past Medical History: Past Medical History:  Diagnosis Date  . Acute myocardial infarction of other inferior wall, initial episode of care   . Anginal pain ([place])   . Anxiety   . Arthritis   . Cancer [facility])    bladder 2013 removed, has cystoscopy 10/2016, has returned x 2  . COPD (chronic obstructive pulmonary disease) ([place])   . Coronary artery disease    Artery bypass graft Jan 2008  . Dyspnea   . Dysrhythmia   . GERD (gastroesophageal reflux disease)   . Heart murmur   . History of hiatal hernia   . Hyperlipidemia   . Hypertension   . Hypothyroidism   . PONV (postoperative nausea and vomiting)   . Sleep apnea   . Stroke ([place])   . Tobacco user     Tobacco Use: History  Smoking Status  . Former Smoker  . Packs/day: 1.00  . Years: 40.00  . Types: Cigarettes  . Start date: 02/20/1959  . Quit date: 09/13/2016  Smokeless Tobacco  . Never Used    Comment: 10/7 2-8 cigarettes per  day. cutting back- AJ    Labs: Recent Review Flowsheet Data    Labs for ITP Cardiac and Pulmonary Rehab Latest Ref Rng & Units 09/19/2016 09/19/2016 09/19/2016 09/19/2016 09/20/2016   Cholestrol 0 - 200 mg/dL - - - - -   LDLCALC 0 - 99 mg/dL - - - - -   LDLDIRECT mg/dL - - - - -   HDL >40 mg/dL - - - - -   Trlycerides <150 mg/dL - - - - -   Hemoglobin A1c 4.8 - 5.6 % - - - - -   PHART 7.350 - 7.450 7.360 - 7.333(L) 7.364 -   PCO2ART 32.0 - 48.0 mmHg 45.1 - 45.6 41.8 -   HCO3 20.0 - 28.0 mmol/L 25.7 - 24.4 23.9 -   TCO2 0 - 100 mmol/L 27 25 26 25 26    ACIDBASEDEF 0.0 - 2.0 mmol/L - - 2.0 1.0 -   O2SAT % 98.0 - 94.0 95.0 -      Capillary Blood Glucose: Lab Results  Component Value Date   GLUCAP 103 (H) 09/23/2016   GLUCAP 99 09/22/2016   GLUCAP 108 (H) 09/22/2016   GLUCAP 102 (H) 09/22/2016    GLUCAP 92 09/22/2016     Exercise Target Goals: Date: 01/10/17  Exercise Program Goal: Individual exercise prescription set with THRR, safety & activity barriers. Participant demonstrates ability to understand and report RPE using BORG scale, to self-measure pulse accurately, and to acknowledge the importance of the exercise prescription.  Exercise Prescription Goal: Starting with aerobic activity 30 plus minutes a day, 3 days per week for initial exercise prescription. Provide home exercise prescription and guidelines that participant acknowledges understanding prior to discharge.  Activity Barriers & Risk Stratification:     Activity Barriers & Cardiac Risk Stratification - 01/10/17 1503      Activity Barriers & Cardiac Risk Stratification   Activity Barriers --  has knee pain with exertion   Cardiac Risk Stratification High      6 Minute Walk:     6 Minute Walk    Row Name 01/10/17 1403         6 Minute Walk   Phase Initial     Distance 1150 feet     Distance % Change 0 %     Distance Feet Change 0 ft     Walk Time 6 minutes     # of Rest Breaks 0     MPH 2.17     METS 2.66     RPE 12     Perceived Dyspnea  12     VO2 Peak 8.33     Symptoms No     Resting HR 62 bpm     Resting BP 122/52     Resting Oxygen Saturation  97 %     Exercise Oxygen Saturation  during 6 min walk 92 %     Max Ex. HR 90 bpm     Max Ex. BP 138/64     2 Minute Post BP 126/58        Oxygen Initial Assessment:   Oxygen Re-Evaluation:   Oxygen Discharge (Final Oxygen Re-Evaluation):   Initial Exercise Prescription:     Initial Exercise Prescription - 01/10/17 1400      Date of Initial Exercise RX and Referring Provider   Date 01/10/17   Referring Provider Dr. Harl Bowie     Treadmill   MPH 1.5   Grade 0   Minutes 15   METs 2.2  NuStep   Level 2   SPM 76   Minutes 20   METs 2     Prescription Details   Frequency (times per week) 3   Duration Progress to 30  minutes of continuous aerobic without signs/symptoms of physical distress     Intensity   THRR 40-80% of Max Heartrate [phone]   Ratings of Perceived Exertion 11-13   Perceived Dyspnea 0-4     Progression   Progression Continue progressive overload as per policy without signs/symptoms or physical distress.     Resistance Training   Training Prescription Yes   Weight 1   Reps 10-15      Perform Capillary Blood Glucose checks as needed.  Exercise Prescription Changes:   Exercise Comments:   Exercise Goals and Review:      Exercise Goals    Row Name 01/10/17 1446             Exercise Goals   Increase Physical Activity Yes       Intervention Provide advice, education, support and counseling about physical activity/exercise needs.;Develop an individualized exercise prescription for aerobic and resistive training based on initial evaluation findings, risk stratification, comorbidities and participant's personal goals.       Expected Outcomes Achievement of increased cardiorespiratory fitness and enhanced flexibility, muscular endurance and strength shown through measurements of functional capacity and personal statement of participant.       Increase Strength and Stamina Yes       Intervention Provide advice, education, support and counseling about physical activity/exercise needs.;Develop an individualized exercise prescription for aerobic and resistive training based on initial evaluation findings, risk stratification, comorbidities and participant's personal goals.       Expected Outcomes Achievement of increased cardiorespiratory fitness and enhanced flexibility, muscular endurance and strength shown through measurements of functional capacity and personal statement of participant.       Able to understand and use rate of perceived exertion (RPE) scale Yes       Intervention Provide education and explanation on how to use RPE scale       Expected Outcomes Short Term:  Able to use RPE daily in rehab to express subjective intensity level;Long Term:  Able to use RPE to guide intensity level when exercising independently       Able to understand and use Dyspnea scale Yes       Intervention Provide education and explanation on how to use Dyspnea scale       Expected Outcomes Short Term: Able to use Dyspnea scale daily in rehab to express subjective sense of shortness of breath during exertion;Long Term: Able to use Dyspnea scale to guide intensity level when exercising independently       Knowledge and understanding of Target Heart Rate Range (THRR) Yes       Intervention Provide education and explanation of THRR including how the numbers were predicted and where they are located for reference       Expected Outcomes Short Term: Able to use daily as guideline for intensity in rehab;Long Term: Able to use THRR to govern intensity when exercising independently       Able to check pulse independently Yes       Intervention Review the importance of being able to check your own pulse for safety during independent exercise       Expected Outcomes Short Term: Able to explain why pulse checking is important during independent exercise;Long Term: Able to check pulse independently and accurately  Understanding of Exercise Prescription Yes       Intervention Provide education, explanation, and written materials on patient's individual exercise prescription       Expected Outcomes Short Term: Able to explain program exercise prescription;Long Term: Able to explain home exercise prescription to exercise independently          Exercise Goals Re-Evaluation :    Discharge Exercise Prescription (Final Exercise Prescription Changes):   Nutrition:  Target Goals: Understanding of nutrition guidelines, daily intake of sodium 1500mg , cholesterol 200mg , calories 30% from fat and 7% or less from saturated fats, daily to have 5 or more servings of fruits and  vegetables.  Biometrics:     Pre Biometrics - 01/10/17 1253      Pre Biometrics   Height 5\' 9"  (1.753 m)   Waist Circumference 39 inches   Hip Circumference 40 inches   Waist to Hip Ratio 0.98 %   Triceps Skinfold 13 mm   % Body Fat 27.8 %   Grip Strength 71.2 kg   Flexibility 0 in   Single Leg Stand 4 seconds       Nutrition Therapy Plan and Nutrition Goals:     Nutrition Therapy & Goals - 01/10/17 1447      Personal Nutrition Goals   Nutrition Goal Plan to explore mediterranean diet as prescribed by MD.      Intervention Plan   Expected Outcomes Long Term Goal: Adherence to prescribed nutrition plan.      Nutrition Discharge: Rate Your Plate Scores:     Nutrition Assessments - 01/10/17 1448      MEDFICTS Scores   Pre Score 27      Nutrition Goals Re-Evaluation:   Nutrition Goals Discharge (Final Nutrition Goals Re-Evaluation):   Psychosocial: Target Goals: Acknowledge presence or absence of significant depression and/or stress, maximize coping skills, provide positive support system. Participant is able to verbalize types and ability to use techniques and skills needed for reducing stress and depression.  Initial Review & Psychosocial Screening:     Initial Psych Review & Screening - 01/10/17 1450      Initial Review   Current issues with None Identified     Family Dynamics   Good Support System? Yes     Barriers   Psychosocial barriers to participate in program There are no identifiable barriers or psychosocial needs.     Screening Interventions   Interventions Encouraged to exercise      Quality of Life Scores:     Quality of Life - 01/10/17 1257      Quality of Life Scores   Health/Function Pre 16.23 %   Socioeconomic Pre 19.21 %   Psych/Spiritual Pre 18.64 %   Family Pre 27.1 %   GLOBAL Pre 18.94 %      PHQ-9: Recent Review Flowsheet Data    Depression screen [facility] 2/9 01/10/2017   Decreased Interest 0   Down, Depressed,  Hopeless 0   PHQ - 2 Score 0   Altered sleeping 0   Tired, decreased energy 1   Change in appetite 0   Feeling bad or failure about yourself  0   Trouble concentrating 0   Moving slowly or fidgety/restless 0   Suicidal thoughts 0   PHQ-9 Score 1   Difficult doing work/chores Somewhat difficult     Interpretation of Total Score  Total Score Depression Severity:  1-4 = Minimal depression, 5-9 = Mild depression, 10-14 = Moderate depression, 15-19 = Moderately severe depression, 20-27 =  Severe depression   Psychosocial Evaluation and Intervention:     Psychosocial Evaluation - 01/10/17 1450      Psychosocial Evaluation & Interventions   Interventions Encouraged to exercise with the program and follow exercise prescription   Continue Psychosocial Services  No Follow up required      Psychosocial Re-Evaluation:   Psychosocial Discharge (Final Psychosocial Re-Evaluation):   Vocational Rehabilitation: Provide vocational rehab assistance to qualifying candidates.   Vocational Rehab Evaluation & Intervention:     Vocational Rehab - 01/10/17 1444      Initial Vocational Rehab Evaluation & Intervention   Assessment shows need for Vocational Rehabilitation No      Education: Education Goals: Education classes will be provided on a weekly basis, covering required topics. Participant will state understanding/return demonstration of topics presented.  Learning Barriers/Preferences:     Learning Barriers/Preferences - 01/10/17 1444      Learning Barriers/Preferences   Learning Barriers Hearing   Learning Preferences Skilled Demonstration;Individual Instruction;Group Instruction      Education Topics: Hypertension, Hypertension Reduction -Define heart disease and high blood pressure. Discus how high blood pressure affects the body and ways to reduce high blood pressure.   Exercise and Your Heart -Discuss why it is important to exercise, the FITT principles of  exercise, normal and abnormal responses to exercise, and how to exercise safely.   Angina -Discuss definition of angina, causes of angina, treatment of angina, and how to decrease risk of having angina.   Cardiac Medications -Review what the following cardiac medications are used for, how they affect the body, and side effects that may occur when taking the medications.  Medications include Aspirin, Beta blockers, calcium channel blockers, ACE Inhibitors, angiotensin receptor blockers, diuretics, digoxin, and antihyperlipidemics.   Congestive Heart Failure -Discuss the definition of CHF, how to live with CHF, the signs and symptoms of CHF, and how keep track of weight and sodium intake.   Heart Disease and Intimacy -Discus the effect sexual activity has on the heart, how changes occur during intimacy as we age, and safety during sexual activity.   Smoking Cessation / COPD -Discuss different methods to quit smoking, the health benefits of quitting smoking, and the definition of COPD.   Nutrition I: Fats -Discuss the types of cholesterol, what cholesterol does to the heart, and how cholesterol levels can be controlled.   Nutrition II: Labels -Discuss the different components of food labels and how to read food label   Heart Parts and Heart Disease -Discuss the anatomy of the heart, the pathway of blood circulation through the heart, and these are affected by heart disease.   Stress I: Signs and Symptoms -Discuss the causes of stress, how stress may lead to anxiety and depression, and ways to limit stress.   Stress II: Relaxation -Discuss different types of relaxation techniques to limit stress.   Warning Signs of Stroke / TIA -Discuss definition of a stroke, what the signs and symptoms are of a stroke, and how to identify when someone is having stroke.   Knowledge Questionnaire Score:     Knowledge Questionnaire Score - 01/10/17 1444      Knowledge Questionnaire  Score   Pre Score 21/24      Core Components/Risk Factors/Patient Goals at Admission:     Personal Goals and Risk Factors at Admission - 01/10/17 1449      Core Components/Risk Factors/Patient Goals on Admission    Weight Management Obesity   Personal Goal Other Yes   Personal Goal  Gain streanth and stamina   Intervention Attend CR 3 x week and supplement with home exercise 2 x week   Expected Outcomes Reach personal goals.       Core Components/Risk Factors/Patient Goals Review:      Goals and Risk Factor Review    Row Name 01/10/17 1449             Core Components/Risk Factors/Patient Goals Review   Personal Goals Review Weight Management/Obesity;Lipids          Core Components/Risk Factors/Patient Goals at Discharge (Final Review):      Goals and Risk Factor Review - 01/10/17 1449      Core Components/Risk Factors/Patient Goals Review   Personal Goals Review Weight Management/Obesity;Lipids      ITP Comments:   Comments: Patient arrived for 1st visit/orientation/education at 1230. Patient was referred to CR by Dr. Payton Emerald from [facility] due to CABGx2 (z95.1). During orientation advised patient on arrival and appointment times what to wear, what to do before, during and after exercise. Reviewed attendance and class policy. Talked about inclement weather and class consultation policy. Pt is scheduled to return Cardiac Rehab on 01/18/17 at 11:00. Pt was advised to come to class 15 minutes before class starts. Patient was also given instructions on meeting with the dietician and attending the Family Structure classes. Pt is eager to get started. Patient participated in warm up stretches followed by light weights and resistance bands. Patient was able to complete 6 minute walk test. Patient c/o (L) knee pain 9/10. Patient pain was at 6/10 at 2 minute rest. Patient was pain free at end of orientation. Patient was measured for the equipment. Discussed equipment safety with patient.  Took patient pre-anthropometric measurements. Patient finished visit at 1430.

## 2017-01-10 NOTE — Progress Notes (Signed)
Daily Session Note  Patient Details  Name: [name] MRN: [id number] Date of Birth: 1945-10-15 Referring Provider:     CARDIAC REHAB PHASE II ORIENTATION from 01/10/2017 in [place]  Referring Provider  Dr. Harl Bowie      Encounter Date: 01/10/2017  Check In:     Session Check In - 01/10/17 1230      Check-In   Location [facility]   Staff Present Russella Dar, MS, EP, [facility], Exercise Physiologist;Gregory Luther Parody, BS, EP, Exercise Physiologist   Supervising physician immediately available to respond to emergencies See telemetry face sheet for immediately available MD   Medication changes reported     No   Fall or balance concerns reported    No   Tobacco Cessation --  Quit 09/2016   Warm-up and Cool-down Performed as group-led instruction   Resistance Training Performed Yes   VAD Patient? No     Pain Assessment   Currently in Pain? No/denies   Pain Score 0-No pain   Multiple Pain Sites No      Capillary Blood Glucose: No results found for this or any previous visit (from the past 24 hour(s)).    History  Smoking Status  . Former Smoker  . Packs/day: 1.00  . Years: 40.00  . Types: Cigarettes  . Start date: 02/20/1959  . Quit date: 09/13/2016  Smokeless Tobacco  . Never Used    Comment: 10/7 2-8 cigarettes per day. cutting back- AJ    Goals Met:  Independence with exercise equipment Exercise tolerated well No report of cardiac concerns or symptoms Strength training completed today  Goals Unmet:  Not Applicable  Comments: Check out: 1430   Dr. Kate Sable is Medical Director for [place] and Pulmonary Rehab.

## 2017-01-10 NOTE — Progress Notes (Signed)
Cardiac/Pulmonary Rehab Medication Review by a Pharmacist  Does the patient  feel that his/her medications are working for him/her?  Yes, some still being adjusted  Has the patient been experiencing any side effects to the medications prescribed?  no  Does the patient measure his/her own blood pressure or blood glucose at home?  yes , blood pressure  Does the patient have any problems obtaining medications due to transportation or finances?   no  Understanding of regimen: excellent Understanding of indications: excellent Potential of compliance: excellent, uses pill box.  Rarely forgets evening meds (fish oil, Lipitor)  Pharmacist comments: Good understanding of medications.  No further questions.  Pricilla Larsson 01/10/2017 2:04 PM

## 2017-01-18 ENCOUNTER — Encounter (HOSPITAL_COMMUNITY)
Admission: RE | Admit: 2017-01-18 | Discharge: 2017-01-18 | Disposition: A | Discharge status: Home / Self Care | Payer: Non-veteran care | Source: Ambulatory Visit | Attending: Cardiology | Admitting: Cardiology

## 2017-01-18 DIAGNOSIS — Z951 Presence of aortocoronary bypass graft: Secondary | ICD-10-CM | POA: Diagnosis present

## 2017-01-18 NOTE — Progress Notes (Signed)
Cardiac Individual Treatment Plan  Patient Details  Name: [name] MRN: [id number] Date of Birth: [birth year] Referring Provider:     [place] from 01/10/2017 in [place]  Referring Provider  Dr. Harl Bowie      Initial Encounter Date:    CARDIAC REHAB PHASE II ORIENTATION from 01/10/2017 in [place]  Date  01/10/17  Referring Provider  Dr. Harl Bowie      Visit Diagnosis: S/P CABG x 2  Patient's Home Medications on Admission:  Current Outpatient Prescriptions:  .  acetaminophen (TYLENOL) 325 MG tablet, Take 2 tablets (650 mg total) by mouth every 6 (six) hours as needed for mild pain., Disp: , Rfl:  .  amLODipine (NORVASC) 10 MG tablet, Take 1 tablet (10 mg total) by mouth daily., Disp: , Rfl:  .  aspirin 81 MG chewable tablet, Chew 1 tablet (81 mg total) by mouth daily., Disp: , Rfl:  .  atorvastatin (LIPITOR) 40 MG tablet, Take 1 tablet (40 mg total) by mouth daily., Disp: 30 tablet, Rfl: 1 .  clopidogrel (PLAVIX) 75 MG tablet, Take 1 tablet (75 mg total) by mouth daily., Disp: 30 tablet, Rfl: 1 .  ergocalciferol (VITAMIN D2) 50000 UNITS capsule, Take 50,000 Units by mouth every Sunday. , Disp: , Rfl:  .  furosemide (LASIX) 40 MG tablet, Take 60 mg in the morning and 40 mg in the evening, Disp: 75 tablet, Rfl: 6 .  levothyroxine (SYNTHROID, LEVOTHROID) 25 MCG tablet, Take 25 mcg by mouth daily before breakfast., Disp: , Rfl:  .  lisinopril (PRINIVIL,ZESTRIL) 10 MG tablet, Take 1 tablet (10 mg total) by mouth daily., Disp: 30 tablet, Rfl: 6 .  magnesium oxide (MAG-OX) 400 MG tablet, Take 400 mg by mouth daily., Disp: , Rfl:  .  metoprolol succinate (TOPROL-XL) 25 MG 24 hr tablet, Take 25 mg by mouth daily., Disp: , Rfl:  .  Omega-3 Fatty Acids (RA FISH OIL) 1000 MG CAPS, Take 2,000 mg by mouth 2 (two) times daily. , Disp: , Rfl:  .  omeprazole (PRILOSEC) 20 MG capsule, Take 40 mg by mouth daily. , Disp: , Rfl:   .  potassium chloride SA (K-DUR,KLOR-CON) 20 MEQ tablet, Take 20 mEq by mouth daily., Disp: , Rfl:  .  PRESCRIPTION MEDICATION, 1 each. Cortisone Injection into each knee every 4 months., Disp: , Rfl:   Past Medical History: Past Medical History:  Diagnosis Date  . Acute myocardial infarction of other inferior wall, initial episode of care   . Anginal pain ([place])   . Anxiety   . Arthritis   . Cancer [facility])    bladder 2013 removed, has cystoscopy 10/2016, has returned x 2  . COPD (chronic obstructive pulmonary disease) ([place])   . Coronary artery disease    Artery bypass graft Jan 2008  . Dyspnea   . Dysrhythmia   . GERD (gastroesophageal reflux disease)   . Heart murmur   . History of hiatal hernia   . Hyperlipidemia   . Hypertension   . Hypothyroidism   . PONV (postoperative nausea and vomiting)   . Sleep apnea   . Stroke ([place])   . Tobacco user     Tobacco Use: History  Smoking Status  . Former Smoker  . Packs/day: 1.00  . Years: 40.00  . Types: Cigarettes  . Start date: 02/20/1959  . Quit date: 09/13/2016  Smokeless Tobacco  . Never Used    Comment: 10/7 2-8 cigarettes per  day. cutting back- AJ    Labs: Recent Review Flowsheet Data    Labs for ITP Cardiac and Pulmonary Rehab Latest Ref Rng & Units 09/19/2016 09/19/2016 09/19/2016 09/19/2016 09/20/2016   Cholestrol 0 - 200 mg/dL - - - - -   LDLCALC 0 - 99 mg/dL - - - - -   LDLDIRECT mg/dL - - - - -   HDL >40 mg/dL - - - - -   Trlycerides <150 mg/dL - - - - -   Hemoglobin A1c 4.8 - 5.6 % - - - - -   PHART 7.350 - 7.450 7.360 - 7.333(L) 7.364 -   PCO2ART 32.0 - 48.0 mmHg 45.1 - 45.6 41.8 -   HCO3 20.0 - 28.0 mmol/L 25.7 - 24.4 23.9 -   TCO2 0 - 100 mmol/L 27 25 26 25 26    ACIDBASEDEF 0.0 - 2.0 mmol/L - - 2.0 1.0 -   O2SAT % 98.0 - 94.0 95.0 -      Capillary Blood Glucose: Lab Results  Component Value Date   GLUCAP 103 (H) 09/23/2016   GLUCAP 99 09/22/2016   GLUCAP 108 (H) 09/22/2016   GLUCAP 102 (H) 09/22/2016    GLUCAP 92 09/22/2016     Exercise Target Goals:    Exercise Program Goal: Individual exercise prescription set with THRR, safety & activity barriers. Participant demonstrates ability to understand and report RPE using BORG scale, to self-measure pulse accurately, and to acknowledge the importance of the exercise prescription.  Exercise Prescription Goal: Starting with aerobic activity 30 plus minutes a day, 3 days per week for initial exercise prescription. Provide home exercise prescription and guidelines that participant acknowledges understanding prior to discharge.  Activity Barriers & Risk Stratification:     Activity Barriers & Cardiac Risk Stratification - 01/10/17 1503      Activity Barriers & Cardiac Risk Stratification   Activity Barriers --  has knee pain with exertion   Cardiac Risk Stratification High      6 Minute Walk:     6 Minute Walk    Row Name 01/10/17 1403         6 Minute Walk   Phase Initial     Distance 1150 feet     Distance % Change 0 %     Distance Feet Change 0 ft     Walk Time 6 minutes     # of Rest Breaks 0     MPH 2.17     METS 2.66     RPE 12     Perceived Dyspnea  12     VO2 Peak 8.33     Symptoms No     Resting HR 62 bpm     Resting BP 122/52     Resting Oxygen Saturation  97 %     Exercise Oxygen Saturation  during 6 min walk 92 %     Max Ex. HR 90 bpm     Max Ex. BP 138/64     2 Minute Post BP 126/58        Oxygen Initial Assessment:   Oxygen Re-Evaluation:   Oxygen Discharge (Final Oxygen Re-Evaluation):   Initial Exercise Prescription:     Initial Exercise Prescription - 01/10/17 1400      Date of Initial Exercise RX and Referring Provider   Date 01/10/17   Referring Provider Dr. Harl Bowie     Treadmill   MPH 1.5   Grade 0   Minutes 15   METs 2.2  NuStep   Level 2   SPM 76   Minutes 20   METs 2     Prescription Details   Frequency (times per week) 3   Duration Progress to 30 minutes of  continuous aerobic without signs/symptoms of physical distress     Intensity   THRR 40-80% of Max Heartrate [phone]   Ratings of Perceived Exertion 11-13   Perceived Dyspnea 0-4     Progression   Progression Continue progressive overload as per policy without signs/symptoms or physical distress.     Resistance Training   Training Prescription Yes   Weight 1   Reps 10-15      Perform Capillary Blood Glucose checks as needed.  Exercise Prescription Changes:   Exercise Comments:   Exercise Goals and Review:      Exercise Goals    Row Name 01/10/17 1446             Exercise Goals   Increase Physical Activity Yes       Intervention Provide advice, education, support and counseling about physical activity/exercise needs.;Develop an individualized exercise prescription for aerobic and resistive training based on initial evaluation findings, risk stratification, comorbidities and participant's personal goals.       Expected Outcomes Achievement of increased cardiorespiratory fitness and enhanced flexibility, muscular endurance and strength shown through measurements of functional capacity and personal statement of participant.       Increase Strength and Stamina Yes       Intervention Provide advice, education, support and counseling about physical activity/exercise needs.;Develop an individualized exercise prescription for aerobic and resistive training based on initial evaluation findings, risk stratification, comorbidities and participant's personal goals.       Expected Outcomes Achievement of increased cardiorespiratory fitness and enhanced flexibility, muscular endurance and strength shown through measurements of functional capacity and personal statement of participant.       Able to understand and use rate of perceived exertion (RPE) scale Yes       Intervention Provide education and explanation on how to use RPE scale       Expected Outcomes Short Term: Able to use  RPE daily in rehab to express subjective intensity level;Long Term:  Able to use RPE to guide intensity level when exercising independently       Able to understand and use Dyspnea scale Yes       Intervention Provide education and explanation on how to use Dyspnea scale       Expected Outcomes Short Term: Able to use Dyspnea scale daily in rehab to express subjective sense of shortness of breath during exertion;Long Term: Able to use Dyspnea scale to guide intensity level when exercising independently       Knowledge and understanding of Target Heart Rate Range (THRR) Yes       Intervention Provide education and explanation of THRR including how the numbers were predicted and where they are located for reference       Expected Outcomes Short Term: Able to use daily as guideline for intensity in rehab;Long Term: Able to use THRR to govern intensity when exercising independently       Able to check pulse independently Yes       Intervention Review the importance of being able to check your own pulse for safety during independent exercise       Expected Outcomes Short Term: Able to explain why pulse checking is important during independent exercise;Long Term: Able to check pulse independently and accurately  Understanding of Exercise Prescription Yes       Intervention Provide education, explanation, and written materials on patient's individual exercise prescription       Expected Outcomes Short Term: Able to explain program exercise prescription;Long Term: Able to explain home exercise prescription to exercise independently          Exercise Goals Re-Evaluation :    Discharge Exercise Prescription (Final Exercise Prescription Changes):   Nutrition:  Target Goals: Understanding of nutrition guidelines, daily intake of sodium 1500mg , cholesterol 200mg , calories 30% from fat and 7% or less from saturated fats, daily to have 5 or more servings of fruits and vegetables.  Biometrics:      Pre Biometrics - 01/10/17 1253      Pre Biometrics   Height 5\' 9"  (1.753 m)   Waist Circumference 39 inches   Hip Circumference 40 inches   Waist to Hip Ratio 0.98 %   Triceps Skinfold 13 mm   % Body Fat 27.8 %   Grip Strength 71.2 kg   Flexibility 0 in   Single Leg Stand 4 seconds       Nutrition Therapy Plan and Nutrition Goals:     Nutrition Therapy & Goals - 01/10/17 1447      Personal Nutrition Goals   Nutrition Goal Plan to explore mediterranean diet as prescribed by MD.      Intervention Plan   Expected Outcomes Long Term Goal: Adherence to prescribed nutrition plan.      Nutrition Discharge: Rate Your Plate Scores:     Nutrition Assessments - 01/10/17 1448      MEDFICTS Scores   Pre Score 27      Nutrition Goals Re-Evaluation:   Nutrition Goals Discharge (Final Nutrition Goals Re-Evaluation):   Psychosocial: Target Goals: Acknowledge presence or absence of significant depression and/or stress, maximize coping skills, provide positive support system. Participant is able to verbalize types and ability to use techniques and skills needed for reducing stress and depression.  Initial Review & Psychosocial Screening:     Initial Psych Review & Screening - 01/10/17 1450      Initial Review   Current issues with None Identified     Family Dynamics   Good Support System? Yes     Barriers   Psychosocial barriers to participate in program There are no identifiable barriers or psychosocial needs.     Screening Interventions   Interventions Encouraged to exercise      Quality of Life Scores:     Quality of Life - 01/10/17 1257      Quality of Life Scores   Health/Function Pre 16.23 %   Socioeconomic Pre 19.21 %   Psych/Spiritual Pre 18.64 %   Family Pre 27.1 %   GLOBAL Pre 18.94 %      PHQ-9: Recent Review Flowsheet Data    Depression screen [facility] 2/9 01/10/2017   Decreased Interest 0   Down, Depressed, Hopeless 0   PHQ - 2 Score 0    Altered sleeping 0   Tired, decreased energy 1   Change in appetite 0   Feeling bad or failure about yourself  0   Trouble concentrating 0   Moving slowly or fidgety/restless 0   Suicidal thoughts 0   PHQ-9 Score 1   Difficult doing work/chores Somewhat difficult     Interpretation of Total Score  Total Score Depression Severity:  1-4 = Minimal depression, 5-9 = Mild depression, 10-14 = Moderate depression, 15-19 = Moderately severe depression, 20-27 =  Severe depression   Psychosocial Evaluation and Intervention:     Psychosocial Evaluation - 01/10/17 1450      Psychosocial Evaluation & Interventions   Interventions Encouraged to exercise with the program and follow exercise prescription   Continue Psychosocial Services  No Follow up required      Psychosocial Re-Evaluation:   Psychosocial Discharge (Final Psychosocial Re-Evaluation):   Vocational Rehabilitation: Provide vocational rehab assistance to qualifying candidates.   Vocational Rehab Evaluation & Intervention:     Vocational Rehab - 01/10/17 1444      Initial Vocational Rehab Evaluation & Intervention   Assessment shows need for Vocational Rehabilitation No      Education: Education Goals: Education classes will be provided on a weekly basis, covering required topics. Participant will state understanding/return demonstration of topics presented.  Learning Barriers/Preferences:     Learning Barriers/Preferences - 01/10/17 1444      Learning Barriers/Preferences   Learning Barriers Hearing   Learning Preferences Skilled Demonstration;Individual Instruction;Group Instruction      Education Topics: Hypertension, Hypertension Reduction -Define heart disease and high blood pressure. Discus how high blood pressure affects the body and ways to reduce high blood pressure.   Exercise and Your Heart -Discuss why it is important to exercise, the FITT principles of exercise, normal and abnormal responses  to exercise, and how to exercise safely.   Angina -Discuss definition of angina, causes of angina, treatment of angina, and how to decrease risk of having angina.   Cardiac Medications -Review what the following cardiac medications are used for, how they affect the body, and side effects that may occur when taking the medications.  Medications include Aspirin, Beta blockers, calcium channel blockers, ACE Inhibitors, angiotensin receptor blockers, diuretics, digoxin, and antihyperlipidemics.   Congestive Heart Failure -Discuss the definition of CHF, how to live with CHF, the signs and symptoms of CHF, and how keep track of weight and sodium intake.   Heart Disease and Intimacy -Discus the effect sexual activity has on the heart, how changes occur during intimacy as we age, and safety during sexual activity.   Smoking Cessation / COPD -Discuss different methods to quit smoking, the health benefits of quitting smoking, and the definition of COPD.   Nutrition I: Fats -Discuss the types of cholesterol, what cholesterol does to the heart, and how cholesterol levels can be controlled.   Nutrition II: Labels -Discuss the different components of food labels and how to read food label   Heart Parts and Heart Disease -Discuss the anatomy of the heart, the pathway of blood circulation through the heart, and these are affected by heart disease.   Stress I: Signs and Symptoms -Discuss the causes of stress, how stress may lead to anxiety and depression, and ways to limit stress.   Stress II: Relaxation -Discuss different types of relaxation techniques to limit stress.   Warning Signs of Stroke / TIA -Discuss definition of a stroke, what the signs and symptoms are of a stroke, and how to identify when someone is having stroke.   Knowledge Questionnaire Score:     Knowledge Questionnaire Score - 01/10/17 1444      Knowledge Questionnaire Score   Pre Score 21/24      Core  Components/Risk Factors/Patient Goals at Admission:     Personal Goals and Risk Factors at Admission - 01/10/17 1449      Core Components/Risk Factors/Patient Goals on Admission    Weight Management Obesity   Personal Goal Other Yes   Personal Goal  Gain streanth and stamina   Intervention Attend CR 3 x week and supplement with home exercise 2 x week   Expected Outcomes Reach personal goals.       Core Components/Risk Factors/Patient Goals Review:      Goals and Risk Factor Review    Row Name 01/10/17 1449             Core Components/Risk Factors/Patient Goals Review   Personal Goals Review Weight Management/Obesity;Lipids          Core Components/Risk Factors/Patient Goals at Discharge (Final Review):      Goals and Risk Factor Review - 01/10/17 1449      Core Components/Risk Factors/Patient Goals Review   Personal Goals Review Weight Management/Obesity;Lipids      ITP Comments:     ITP Comments    Row Name 01/18/17 0742           ITP Comments Patient new to program. Plans to start 01/18/17.           Comments: ITP 30 Day REVIEW Patient new to program. Plans to start 01/18/17.

## 2017-01-18 NOTE — Progress Notes (Addendum)
Daily Session Note  Patient Details  Name: [name] MRN: [id number] Date of Birth: September 10, 1945 Referring Provider:     CARDIAC REHAB PHASE II ORIENTATION from 01/10/2017 in [place]  Referring Provider  Dr. Harl Bowie      Encounter Date: 01/18/2017  Check In:     Session Check In - 01/18/17 1104      Check-In   Location [facility]   Staff Present Aundra Dubin, RN, BSN;Laxmi Choung Luther Parody, BS, EP, Exercise Physiologist   Supervising physician immediately available to respond to emergencies See telemetry face sheet for immediately available MD   Medication changes reported     No   Fall or balance concerns reported    No   Warm-up and Cool-down Performed as group-led instruction   Resistance Training Performed Yes   VAD Patient? No     Pain Assessment   Currently in Pain? Yes   Pain Score 1    Pain Location Knee   Pain Orientation Left   Pain Descriptors / Indicators Aching;Constant   Pain Type Chronic pain   Pain Onset More than a month ago   Pain Frequency Constant   Multiple Pain Sites No      Capillary Blood Glucose: No results found for this or any previous visit (from the past 24 hour(s)).    History  Smoking Status  . Former Smoker  . Packs/day: 1.00  . Years: 40.00  . Types: Cigarettes  . Start date: 02/20/1959  . Quit date: 09/13/2016  Smokeless Tobacco  . Never Used    Comment: 10/7 2-8 cigarettes per day. cutting back- AJ    Goals Met:  Independence with exercise equipment Exercise tolerated well No report of cardiac concerns or symptoms Strength training completed today  Goals Unmet:  Not Applicable  Comments: Check out 1200. Left knee pain 1/10. Patient received his take home exercise plan today. He spoke of doing activities around the house. I educated him on THR and safe ways to be active outside of CR. He demonstrated an understanding. I encouraged him to come to me if he had any questions.    Dr.  Kate Sable is Medical Director for [facility] Cardiac and Pulmonary Rehab.

## 2017-01-20 ENCOUNTER — Encounter (HOSPITAL_COMMUNITY)
Admission: RE | Admit: 2017-01-20 | Discharge: 2017-01-20 | Disposition: A | Discharge status: Home / Self Care | Payer: Non-veteran care | Source: Ambulatory Visit | Attending: Cardiology | Admitting: Cardiology

## 2017-01-20 DIAGNOSIS — Z951 Presence of aortocoronary bypass graft: Secondary | ICD-10-CM

## 2017-01-20 NOTE — Progress Notes (Signed)
Daily Session Note  Patient Details  Name: [name] MRN: [id number] Date of Birth: 1945-10-25 Referring Provider:     [place] from 01/10/2017 in [place]  Referring Provider  Dr. Harl Bowie      Encounter Date: 01/20/2017  Check In:     Session Check In - 01/20/17 1106      Check-In   Location [facility]   Staff Present Aundra Dubin, RN, BSN;Decarlos Empey Luther Parody, BS, EP, Exercise Physiologist   Supervising physician immediately available to respond to emergencies See telemetry face sheet for immediately available MD   Medication changes reported     No   Fall or balance concerns reported    No   Warm-up and Cool-down Performed as group-led instruction   Resistance Training Performed Yes   VAD Patient? No     Pain Assessment   Currently in Pain? No/denies   Pain Score 0-No pain   Multiple Pain Sites No      Capillary Blood Glucose: No results found for this or any previous visit (from the past 24 hour(s)).    History  Smoking Status  . Former Smoker  . Packs/day: 1.00  . Years: 40.00  . Types: Cigarettes  . Start date: 02/20/1959  . Quit date: 09/13/2016  Smokeless Tobacco  . Never Used    Comment: 10/7 2-8 cigarettes per day. cutting back- AJ    Goals Met:  Independence with exercise equipment Exercise tolerated well No report of cardiac concerns or symptoms Strength training completed today  Goals Unmet:  Not Applicable  Comments: Check out 1200   Dr. Kate Sable is Medical Director for [place] and Pulmonary Rehab.

## 2017-01-23 ENCOUNTER — Encounter (HOSPITAL_COMMUNITY)
Admission: RE | Admit: 2017-01-23 | Discharge: 2017-01-23 | Disposition: A | Discharge status: Home / Self Care | Payer: Non-veteran care | Source: Ambulatory Visit | Attending: Cardiology | Admitting: Cardiology

## 2017-01-23 ENCOUNTER — Telehealth: Payer: Self-pay | Admitting: Cardiology

## 2017-01-23 DIAGNOSIS — Z951 Presence of aortocoronary bypass graft: Secondary | ICD-10-CM

## 2017-01-23 NOTE — Progress Notes (Signed)
Daily Session Note  Patient Details  Name: [name] MRN: [id number] Date of Birth: 1946/05/11 Referring Provider:     [place] from 01/10/2017 in [place]  Referring Provider  Dr. Harl Bowie      Encounter Date: 01/23/2017  Check In:     Session Check In - 01/23/17 1054      Check-In   Location [facility]   Staff Present Aundra Dubin, RN, BSN;Ronesha Heenan Luther Parody, BS, EP, Exercise Physiologist   Supervising physician immediately available to respond to emergencies See telemetry face sheet for immediately available MD   Medication changes reported     No   Fall or balance concerns reported    No   Warm-up and Cool-down Performed as group-led instruction   Resistance Training Performed Yes   VAD Patient? No     Pain Assessment   Currently in Pain? No/denies   Pain Score 0-No pain   Multiple Pain Sites No      Capillary Blood Glucose: No results found for this or any previous visit (from the past 24 hour(s)).    History  Smoking Status  . Former Smoker  . Packs/day: 1.00  . Years: 40.00  . Types: Cigarettes  . Start date: 02/20/1959  . Quit date: 09/13/2016  Smokeless Tobacco  . Never Used    Comment: 10/7 2-8 cigarettes per day. cutting back- AJ    Goals Met:  Independence with exercise equipment Exercise tolerated well No report of cardiac concerns or symptoms Strength training completed today  Goals Unmet:  None  Comments: Check out 1200   Dr. Kate Sable is Medical Director for [place] and Pulmonary Rehab.

## 2017-01-23 NOTE — Telephone Encounter (Signed)
[name] called wanting to know if have received recent labs from the [place].  Requesting to know about if his Lasix will be deceased.  Please call [phone].

## 2017-01-23 NOTE — Telephone Encounter (Signed)
Lab results scanned into chart - routed to Dr Harl Bowie

## 2017-01-24 NOTE — Telephone Encounter (Signed)
Pt aware - says weight remains between 214-218lbs - says "hasn't had much swelling at all" says he has been eating a lot of salty foods lately and has started a new lower sodium diet - wanted to know if he needed to decrease or change lasix    Kidney function is mildly decreased but overall stable from what its been over the last few months. How is his weight and swellign doing?   Zandra Abts MD

## 2017-01-24 NOTE — Telephone Encounter (Signed)
Pt aware and voiced understanding - updated medication list

## 2017-01-24 NOTE — Addendum Note (Signed)
Addended by: Julian Hy T on: 01/24/2017 03:58 PM   Modules accepted: Orders

## 2017-01-24 NOTE — Telephone Encounter (Signed)
Please see response in result note  J Zyion Doxtater MD

## 2017-01-24 NOTE — Telephone Encounter (Signed)
Verify he has been taking lasix 60mg  in AM and 40mg  in PM, if so change to 40mg  bid. If weight gets to 220 or higher go back to 60mg  in AM and 66m in PM until comes back down then change back to 40mg  bid   Zandra Abts MD

## 2017-01-25 ENCOUNTER — Encounter (HOSPITAL_COMMUNITY)
Admission: RE | Admit: 2017-01-25 | Discharge: 2017-01-25 | Disposition: A | Discharge status: Home / Self Care | Payer: Non-veteran care | Source: Ambulatory Visit | Attending: Cardiology | Admitting: Cardiology

## 2017-01-25 ENCOUNTER — Ambulatory Visit: Payer: Medicare Other | Admitting: Cardiology

## 2017-01-25 DIAGNOSIS — Z951 Presence of aortocoronary bypass graft: Secondary | ICD-10-CM | POA: Diagnosis not present

## 2017-01-25 NOTE — Progress Notes (Signed)
Daily Session Note  Patient Details  Name: [name] MRN: [id number] Date of Birth: 10/22/1945 Referring Provider:     CARDIAC REHAB PHASE II ORIENTATION from 01/10/2017 in [place] [facility]  Referring Provider  Dr. Branch      Encounter Date: 01/25/2017  Check In:     Session Check In - 01/25/17 1102      Check-In   Location [facility]   Staff Present Debra Johnson, RN, BSN;Eilan Mcinerny, BS, EP, Exercise Physiologist   Supervising physician immediately available to respond to emergencies See telemetry face sheet for immediately available MD   Medication changes reported     No   Fall or balance concerns reported    No   Warm-up and Cool-down Performed as group-led instruction   Resistance Training Performed Yes   VAD Patient? No     Pain Assessment   Currently in Pain? No/denies   Pain Score 0-No pain   Multiple Pain Sites No      Capillary Blood Glucose: No results found for this or any previous visit (from the past 24 hour(s)).    History  Smoking Status  . Former Smoker  . Packs/day: 1.00  . Years: 40.00  . Types: Cigarettes  . Start date: 02/20/1959  . Quit date: 09/13/2016  Smokeless Tobacco  . Never Used    Comment: 10/7 2-8 cigarettes per day. cutting back- AJ    Goals Met:  Independence with exercise equipment Exercise tolerated well No report of cardiac concerns or symptoms Strength training completed today  Goals Unmet:  Not Applicable  Comments: Check out 1200   Dr. Suresh Koneswaran is Medical Director for  Cardiac and Pulmonary Rehab. 

## 2017-01-27 ENCOUNTER — Encounter (HOSPITAL_COMMUNITY)
Admission: RE | Admit: 2017-01-27 | Discharge: 2017-01-27 | Disposition: A | Discharge status: Home / Self Care | Payer: Non-veteran care | Source: Ambulatory Visit | Attending: Cardiology | Admitting: Cardiology

## 2017-01-27 DIAGNOSIS — Z951 Presence of aortocoronary bypass graft: Secondary | ICD-10-CM

## 2017-01-27 NOTE — Progress Notes (Signed)
Daily Session Note  Patient Details  Name: [name] MRN: [id number] Date of Birth: [birth year] Referring Provider:     [place] from 01/10/2017 in [place]  Referring Provider  Dr. Harl Bowie      Encounter Date: 01/27/2017  Check In:     Session Check In - 01/27/17 1107      Check-In   Location [facility]   Staff Present Aundra Dubin, RN, BSN;Marigny Borre Luther Parody, BS, EP, Exercise Physiologist   Supervising physician immediately available to respond to emergencies See telemetry face sheet for immediately available MD   Medication changes reported     No   Fall or balance concerns reported    No   Warm-up and Cool-down Performed as group-led instruction   Resistance Training Performed Yes   VAD Patient? No     Pain Assessment   Currently in Pain? No/denies   Pain Score 0-No pain   Multiple Pain Sites No      Capillary Blood Glucose: No results found for this or any previous visit (from the past 24 hour(s)).    History  Smoking Status  . Former Smoker  . Packs/day: 1.00  . Years: 40.00  . Types: Cigarettes  . Start date: 02/20/1959  . Quit date: 09/13/2016  Smokeless Tobacco  . Never Used    Comment: 10/7 2-8 cigarettes per day. cutting back- AJ    Goals Met:  Independence with exercise equipment Exercise tolerated well No report of cardiac concerns or symptoms Strength training completed today  Goals Unmet:  Not Applicable  Comments: Check out 1200   Dr. Kate Sable is Medical Director for [place] and Pulmonary Rehab.

## 2017-01-30 ENCOUNTER — Encounter (HOSPITAL_COMMUNITY)
Admission: RE | Admit: 2017-01-30 | Discharge: 2017-01-30 | Disposition: A | Discharge status: Home / Self Care | Payer: Non-veteran care | Source: Ambulatory Visit | Attending: Cardiology | Admitting: Cardiology

## 2017-01-30 DIAGNOSIS — Z951 Presence of aortocoronary bypass graft: Secondary | ICD-10-CM | POA: Diagnosis not present

## 2017-01-30 NOTE — Progress Notes (Signed)
Daily Session Note  Patient Details  Name: KRAVEN CALK MRN: [id number] Date of Birth: 1946/04/17 Referring Provider:     [facility] from 01/10/2017 in [place]  Referring Provider  Dr. Harl Bowie      Encounter Date: 01/30/2017  Check In:     Session Check In - 01/30/17 1104      Check-In   Location [facility]   Staff Present Aundra Dubin, RN, BSN;Asier Desroches Luther Parody, BS, EP, Exercise Physiologist   Supervising physician immediately available to respond to emergencies See telemetry face sheet for immediately available MD   Medication changes reported     No   Fall or balance concerns reported    No   Warm-up and Cool-down Performed as group-led instruction   Resistance Training Performed Yes   VAD Patient? No     Pain Assessment   Currently in Pain? No/denies   Pain Score 0-No pain   Multiple Pain Sites No      Capillary Blood Glucose: No results found for this or any previous visit (from the past 24 hour(s)).    History  Smoking Status  . Former Smoker  . Packs/day: 1.00  . Years: 40.00  . Types: Cigarettes  . Start date: 02/20/1959  . Quit date: 09/13/2016  Smokeless Tobacco  . Never Used    Comment: 10/7 2-8 cigarettes per day. cutting back- AJ    Goals Met:  Independence with exercise equipment Exercise tolerated well No report of cardiac concerns or symptoms Strength training completed today  Goals Unmet:  Not Applicable  Comments: Check out 1200   Dr. Kate Sable is Medical Director for [place] and Pulmonary Rehab.

## 2017-02-01 ENCOUNTER — Encounter (HOSPITAL_COMMUNITY)
Admission: RE | Admit: 2017-02-01 | Discharge: 2017-02-01 | Disposition: A | Discharge status: Home / Self Care | Payer: Non-veteran care | Source: Ambulatory Visit | Attending: Cardiology | Admitting: Cardiology

## 2017-02-01 DIAGNOSIS — Z951 Presence of aortocoronary bypass graft: Secondary | ICD-10-CM | POA: Diagnosis not present

## 2017-02-01 NOTE — Progress Notes (Signed)
Daily Session Note  Patient Details  Name: [name] MRN: [id number] Date of Birth: [birth year] Referring Provider:     [place] from 01/10/2017 in [place]  Referring Provider  Dr. Harl Bowie      Encounter Date: 02/01/2017  Check In:     Session Check In - 02/01/17 1113      Check-In   Location [facility]   Staff Present Aundra Dubin, RN, BSN;Mauricia Mertens Luther Parody, BS, EP, Exercise Physiologist   Supervising physician immediately available to respond to emergencies See telemetry face sheet for immediately available MD   Medication changes reported     No   Fall or balance concerns reported    No   Warm-up and Cool-down Performed as group-led instruction   Resistance Training Performed Yes   VAD Patient? No     Pain Assessment   Currently in Pain? No/denies   Pain Score 0-No pain   Multiple Pain Sites No      Capillary Blood Glucose: No results found for this or any previous visit (from the past 24 hour(s)).    History  Smoking Status  . Former Smoker  . Packs/day: 1.00  . Years: 40.00  . Types: Cigarettes  . Start date: 02/20/1959  . Quit date: 09/13/2016  Smokeless Tobacco  . Never Used    Comment: 10/7 2-8 cigarettes per day. cutting back- AJ    Goals Met:  Independence with exercise equipment Exercise tolerated well No report of cardiac concerns or symptoms Strength training completed today  Goals Unmet:  Not Applicable  Comments: Check out 1200   Dr. Kate Sable is Medical Director for [place] and Pulmonary Rehab.

## 2017-02-03 ENCOUNTER — Encounter (HOSPITAL_COMMUNITY)
Admission: RE | Admit: 2017-02-03 | Discharge: 2017-02-03 | Disposition: A | Discharge status: Home / Self Care | Payer: Non-veteran care | Source: Ambulatory Visit | Attending: Cardiology | Admitting: Cardiology

## 2017-02-03 DIAGNOSIS — Z951 Presence of aortocoronary bypass graft: Secondary | ICD-10-CM | POA: Diagnosis not present

## 2017-02-03 NOTE — Progress Notes (Signed)
Daily Session Note  Patient Details  Name: [name] MRN: [id number] Date of Birth: [birth year] Referring Provider:     CARDIAC REHAB PHASE II ORIENTATION from 01/10/2017 in [place]  Referring Provider  Dr. Harl Bowie      Encounter Date: 02/03/2017  Check In:     Session Check In - 02/03/17 1100      Check-In   Location [facility]   Staff Present Magali Bray Angelina Pih, MS, EP, [facility], Exercise Physiologist;Debra Wynetta Emery, RN, BSN   Supervising physician immediately available to respond to emergencies See telemetry face sheet for immediately available MD   Medication changes reported     No   Fall or balance concerns reported    No   Warm-up and Cool-down Performed as group-led instruction   Resistance Training Performed Yes   VAD Patient? No     Pain Assessment   Currently in Pain? No/denies   Pain Score 0-No pain   Multiple Pain Sites No      Capillary Blood Glucose: No results found for this or any previous visit (from the past 24 hour(s)).    History  Smoking Status  . Former Smoker  . Packs/day: 1.00  . Years: 40.00  . Types: Cigarettes  . Start date: 02/20/1959  . Quit date: 09/13/2016  Smokeless Tobacco  . Never Used    Comment: 10/7 2-8 cigarettes per day. cutting back- AJ    Goals Met:  Independence with exercise equipment Exercise tolerated well No report of cardiac concerns or symptoms Strength training completed today  Goals Unmet:  Not Applicable  Comments: Check out: 12:00   Dr. Kate Sable is Medical Director for [place] and Pulmonary Rehab.

## 2017-02-06 ENCOUNTER — Encounter (HOSPITAL_COMMUNITY)
Admission: RE | Admit: 2017-02-06 | Discharge: 2017-02-06 | Disposition: A | Discharge status: Home / Self Care | Payer: Non-veteran care | Source: Ambulatory Visit | Attending: Cardiology | Admitting: Cardiology

## 2017-02-06 DIAGNOSIS — Z951 Presence of aortocoronary bypass graft: Secondary | ICD-10-CM

## 2017-02-06 NOTE — Progress Notes (Signed)
Daily Session Note  Patient Details  Name: [name] MRN: [id number] Date of Birth: 12-19-1945 Referring Provider:     [place] from 01/10/2017 in [place]  Referring Provider  Dr. Harl Bowie      Encounter Date: 02/06/2017  Check In:     Session Check In - 02/06/17 1055      Check-In   Location [facility]   Staff Present Aundra Dubin, RN, BSN;Shakeerah Gradel Luther Parody, BS, EP, Exercise Physiologist   Supervising physician immediately available to respond to emergencies See telemetry face sheet for immediately available MD   Medication changes reported     No   Fall or balance concerns reported    No   Warm-up and Cool-down Performed as group-led instruction   Resistance Training Performed Yes   VAD Patient? No     Pain Assessment   Currently in Pain? No/denies   Pain Score 0-No pain   Multiple Pain Sites No      Capillary Blood Glucose: No results found for this or any previous visit (from the past 24 hour(s)).    History  Smoking Status  . Former Smoker  . Packs/day: 1.00  . Years: 40.00  . Types: Cigarettes  . Start date: 02/20/1959  . Quit date: 09/13/2016  Smokeless Tobacco  . Never Used    Comment: 10/7 2-8 cigarettes per day. cutting back- AJ    Goals Met:  Independence with exercise equipment Exercise tolerated well No report of cardiac concerns or symptoms Strength training completed today  Goals Unmet:  Not Applicable  Comments: Check out 1200   Dr. Kate Sable is Medical Director for [place] and Pulmonary Rehab.

## 2017-02-08 ENCOUNTER — Encounter (HOSPITAL_COMMUNITY): Payer: Non-veteran care

## 2017-02-10 ENCOUNTER — Encounter (HOSPITAL_COMMUNITY)
Admission: RE | Admit: 2017-02-10 | Discharge: 2017-02-10 | Disposition: A | Discharge status: Home / Self Care | Payer: Non-veteran care | Source: Ambulatory Visit | Attending: Cardiology | Admitting: Cardiology

## 2017-02-10 DIAGNOSIS — Z951 Presence of aortocoronary bypass graft: Secondary | ICD-10-CM | POA: Diagnosis not present

## 2017-02-10 NOTE — Progress Notes (Signed)
Daily Session Note  Patient Details  Name: [name] MRN: [id number] Date of Birth: 1946-05-10 Referring Provider:     CARDIAC REHAB PHASE II ORIENTATION from 01/10/2017 in [place]  Referring Provider  Dr. Harl Bowie      Encounter Date: 02/10/2017  Check In:     Session Check In - 02/10/17 1042      Check-In   Location [facility]   Staff Present Aundra Dubin, RN, BSN;Adian Jablonowski Luther Parody, BS, EP, Exercise Physiologist   Supervising physician immediately available to respond to emergencies See telemetry face sheet for immediately available MD   Medication changes reported     No   Fall or balance concerns reported    No   Warm-up and Cool-down Performed as group-led instruction   Resistance Training Performed Yes   VAD Patient? No     Pain Assessment   Currently in Pain? No/denies   Pain Score 0-No pain   Multiple Pain Sites No      Capillary Blood Glucose: No results found for this or any previous visit (from the past 24 hour(s)).    History  Smoking Status  . Former Smoker  . Packs/day: 1.00  . Years: 40.00  . Types: Cigarettes  . Start date: 02/20/1959  . Quit date: 09/13/2016  Smokeless Tobacco  . Never Used    Comment: 10/7 2-8 cigarettes per day. cutting back- AJ    Goals Met:  Independence with exercise equipment Exercise tolerated well No report of cardiac concerns or symptoms Strength training completed today  Goals Unmet:  Not Applicable  Comments: Check out 1200   Dr. Kate Sable is Medical Director for [place] and Pulmonary Rehab.

## 2017-02-13 ENCOUNTER — Encounter (HOSPITAL_COMMUNITY)
Admission: RE | Admit: 2017-02-13 | Discharge: 2017-02-13 | Disposition: A | Discharge status: Home / Self Care | Payer: Non-veteran care | Source: Ambulatory Visit | Attending: Cardiovascular Disease | Admitting: Cardiovascular Disease

## 2017-02-13 DIAGNOSIS — Z951 Presence of aortocoronary bypass graft: Secondary | ICD-10-CM | POA: Diagnosis present

## 2017-02-13 NOTE — Progress Notes (Addendum)
Daily Session Note  Patient Details  Name: [name] MRN: [id number] Date of Birth: 1946/01/17 Referring Provider:     CARDIAC REHAB PHASE II ORIENTATION from 01/10/2017 in [place]  Referring Provider  Dr. Harl Bowie      Encounter Date: 02/13/2017  Check In:     Session Check In - 02/13/17 1104      Check-In   Location [facility]   Staff Present Aundra Dubin, RN, BSN;Melaysia Streed Luther Parody, BS, EP, Exercise Physiologist   Supervising physician immediately available to respond to emergencies See telemetry face sheet for immediately available MD   Medication changes reported     No   Fall or balance concerns reported    No   Warm-up and Cool-down Performed as group-led instruction   Resistance Training Performed Yes   VAD Patient? No     Pain Assessment   Currently in Pain? No/denies   Pain Score 0-No pain   Multiple Pain Sites No      Capillary Blood Glucose: No results found for this or any previous visit (from the past 24 hour(s)).    History  Smoking Status  . Former Smoker  . Packs/day: 1.00  . Years: 40.00  . Types: Cigarettes  . Start date: 02/20/1959  . Quit date: 09/13/2016  Smokeless Tobacco  . Never Used    Comment: 10/7 2-8 cigarettes per day. cutting back- AJ    Goals Met:  Independence with exercise equipment Exercise tolerated well No report of cardiac concerns or symptoms Strength training completed today  Goals Unmet:  Not Applicable  Comments: Check out 1200. Patient was exercising on the [place] when he started complaining of indigestion. He stated that he had eaten breakfast later than usual. The RN met with him and he refused to go to ER. Patient was recommended to see his doctor or go to the ER if the feeling continues. He stated that the feeling started going away when he switched machines.     Dr. Kate Sable is Medical Director for [facility] and Pulmonary Rehab.

## 2017-02-15 ENCOUNTER — Encounter (HOSPITAL_COMMUNITY)
Admission: RE | Admit: 2017-02-15 | Discharge: 2017-02-15 | Disposition: A | Discharge status: Home / Self Care | Payer: Non-veteran care | Source: Ambulatory Visit | Attending: Cardiology | Admitting: Cardiology

## 2017-02-15 DIAGNOSIS — Z951 Presence of aortocoronary bypass graft: Secondary | ICD-10-CM | POA: Diagnosis not present

## 2017-02-15 NOTE — Progress Notes (Signed)
Daily Session Note  Patient Details  Name: [name] MRN: [id number] Date of Birth: [birth year] Referring Provider:     [place] from 01/10/2017 in [place]  Referring Provider  Dr. Harl Bowie      Encounter Date: 02/15/2017  Check In:     Session Check In - 02/15/17 1105      Check-In   Location [facility]   Staff Present Aundra Dubin, RN, BSN;Desean Heemstra Luther Parody, BS, EP, Exercise Physiologist   Supervising physician immediately available to respond to emergencies See telemetry face sheet for immediately available MD   Medication changes reported     No   Fall or balance concerns reported    No   Warm-up and Cool-down Performed as group-led instruction   Resistance Training Performed Yes   VAD Patient? No     Pain Assessment   Currently in Pain? No/denies   Pain Score 0-No pain   Multiple Pain Sites No      Capillary Blood Glucose: No results found for this or any previous visit (from the past 24 hour(s)).    History  Smoking Status  . Former Smoker  . Packs/day: 1.00  . Years: 40.00  . Types: Cigarettes  . Start date: 02/20/1959  . Quit date: 09/13/2016  Smokeless Tobacco  . Never Used    Comment: 10/7 2-8 cigarettes per day. cutting back- AJ    Goals Met:  Independence with exercise equipment Exercise tolerated well No report of cardiac concerns or symptoms Strength training completed today  Goals Unmet:  Not Applicable  Comments: Check out 1200   Dr. Kate Sable is Medical Director for [place] and Pulmonary Rehab.

## 2017-02-16 NOTE — Progress Notes (Signed)
Cardiac Individual Treatment Plan  Patient Details  Name: [name] MRN: [id number] Date of Birth: 10/01/1945 Referring Provider:     [facility] from 01/10/2017 in [place]  Referring Provider  Dr. Harl Bowie      Initial Encounter Date:    CARDIAC REHAB PHASE II ORIENTATION from 01/10/2017 in [place]  Date  01/10/17  Referring Provider  Dr. Harl Bowie      Visit Diagnosis: S/P CABG x 2  Patient's Home Medications on Admission:  Current Outpatient Prescriptions:  .  acetaminophen (TYLENOL) 325 MG tablet, Take 2 tablets (650 mg total) by mouth every 6 (six) hours as needed for mild pain., Disp: , Rfl:  .  amLODipine (NORVASC) 10 MG tablet, Take 1 tablet (10 mg total) by mouth daily., Disp: , Rfl:  .  aspirin 81 MG chewable tablet, Chew 1 tablet (81 mg total) by mouth daily., Disp: , Rfl:  .  atorvastatin (LIPITOR) 40 MG tablet, Take 1 tablet (40 mg total) by mouth daily., Disp: 30 tablet, Rfl: 1 .  clopidogrel (PLAVIX) 75 MG tablet, Take 1 tablet (75 mg total) by mouth daily., Disp: 30 tablet, Rfl: 1 .  ergocalciferol (VITAMIN D2) 50000 UNITS capsule, Take 50,000 Units by mouth every Sunday. , Disp: , Rfl:  .  furosemide (LASIX) 40 MG tablet, Take 40 mg by mouth 2 (two) times daily. WEIGHT >220LBS TAKE 60 AM AND 40 PM UNTIL WEIGHT COMES DOWN THEN RESUME 40 MG BID, Disp: , Rfl:  .  levothyroxine (SYNTHROID, LEVOTHROID) 25 MCG tablet, Take 25 mcg by mouth daily before breakfast., Disp: , Rfl:  .  lisinopril (PRINIVIL,ZESTRIL) 10 MG tablet, Take 1 tablet (10 mg total) by mouth daily., Disp: 30 tablet, Rfl: 6 .  magnesium oxide (MAG-OX) 400 MG tablet, Take 400 mg by mouth daily., Disp: , Rfl:  .  metoprolol succinate (TOPROL-XL) 25 MG 24 hr tablet, Take 25 mg by mouth daily., Disp: , Rfl:  .  Omega-3 Fatty Acids (RA FISH OIL) 1000 MG CAPS, Take 2,000 mg by mouth 2 (two) times daily. , Disp: , Rfl:  .  omeprazole  (PRILOSEC) 20 MG capsule, Take 40 mg by mouth daily. , Disp: , Rfl:  .  potassium chloride SA (K-DUR,KLOR-CON) 20 MEQ tablet, Take 20 mEq by mouth daily., Disp: , Rfl:  .  PRESCRIPTION MEDICATION, 1 each. Cortisone Injection into each knee every 4 months., Disp: , Rfl:   Past Medical History: Past Medical History:  Diagnosis Date  . Acute myocardial infarction of other inferior wall, initial episode of care   . Anginal pain ([place])   . Anxiety   . Arthritis   . Cancer [facility])    bladder 2013 removed, has cystoscopy 10/2016, has returned x 2  . COPD (chronic obstructive pulmonary disease) ([place])   . Coronary artery disease    Artery bypass graft Jan 2008  . Dyspnea   . Dysrhythmia   . GERD (gastroesophageal reflux disease)   . Heart murmur   . History of hiatal hernia   . Hyperlipidemia   . Hypertension   . Hypothyroidism   . PONV (postoperative nausea and vomiting)   . Sleep apnea   . Stroke ([place])   . Tobacco user     Tobacco Use: History  Smoking Status  . Former Smoker  . Packs/day: 1.00  . Years: 40.00  . Types: Cigarettes  . Start date: 02/20/1959  . Quit date: 09/13/2016  Smokeless  Tobacco  . Never Used    Comment: 10/7 2-8 cigarettes per day. cutting back- AJ    Labs: Recent Review Flowsheet Data    Labs for ITP Cardiac and Pulmonary Rehab Latest Ref Rng & Units 09/19/2016 09/19/2016 09/19/2016 09/19/2016 09/20/2016   Cholestrol 0 - 200 mg/dL - - - - -   LDLCALC 0 - 99 mg/dL - - - - -   LDLDIRECT mg/dL - - - - -   HDL >40 mg/dL - - - - -   Trlycerides <150 mg/dL - - - - -   Hemoglobin A1c 4.8 - 5.6 % - - - - -   PHART 7.350 - 7.450 7.360 - 7.333(L) 7.364 -   PCO2ART 32.0 - 48.0 mmHg 45.1 - 45.6 41.8 -   HCO3 20.0 - 28.0 mmol/L 25.7 - 24.4 23.9 -   TCO2 0 - 100 mmol/L 27 25 26 25 26    ACIDBASEDEF 0.0 - 2.0 mmol/L - - 2.0 1.0 -   O2SAT % 98.0 - 94.0 95.0 -      Capillary Blood Glucose: Lab Results  Component Value Date   GLUCAP 103 (H) 09/23/2016   GLUCAP 99  09/22/2016   GLUCAP 108 (H) 09/22/2016   GLUCAP 102 (H) 09/22/2016   GLUCAP 92 09/22/2016     Exercise Target Goals:    Exercise Program Goal: Individual exercise prescription set with THRR, safety & activity barriers. Participant demonstrates ability to understand and report RPE using BORG scale, to self-measure pulse accurately, and to acknowledge the importance of the exercise prescription.  Exercise Prescription Goal: Starting with aerobic activity 30 plus minutes a day, 3 days per week for initial exercise prescription. Provide home exercise prescription and guidelines that participant acknowledges understanding prior to discharge.  Activity Barriers & Risk Stratification:     Activity Barriers & Cardiac Risk Stratification - 01/10/17 1503      Activity Barriers & Cardiac Risk Stratification   Activity Barriers --  has knee pain with exertion   Cardiac Risk Stratification High      6 Minute Walk:     6 Minute Walk    Row Name 01/10/17 1403         6 Minute Walk   Phase Initial     Distance 1150 feet     Distance % Change 0 %     Distance Feet Change 0 ft     Walk Time 6 minutes     # of Rest Breaks 0     MPH 2.17     METS 2.66     RPE 12     Perceived Dyspnea  12     VO2 Peak 8.33     Symptoms No     Resting HR 62 bpm     Resting BP 122/52     Resting Oxygen Saturation  97 %     Exercise Oxygen Saturation  during 6 min walk 92 %     Max Ex. HR 90 bpm     Max Ex. BP 138/64     2 Minute Post BP 126/58        Oxygen Initial Assessment:   Oxygen Re-Evaluation:   Oxygen Discharge (Final Oxygen Re-Evaluation):   Initial Exercise Prescription:     Initial Exercise Prescription - 01/10/17 1400      Date of Initial Exercise RX and Referring Provider   Date 01/10/17   Referring Provider Dr. Harl Bowie     Treadmill   MPH  1.5   Grade 0   Minutes 15   METs 2.2     NuStep   Level 2   SPM 76   Minutes 20   METs 2     Prescription Details    Frequency (times per week) 3   Duration Progress to 30 minutes of continuous aerobic without signs/symptoms of physical distress     Intensity   THRR 40-80% of Max Heartrate [phone]   Ratings of Perceived Exertion 11-13   Perceived Dyspnea 0-4     Progression   Progression Continue progressive overload as per policy without signs/symptoms or physical distress.     Resistance Training   Training Prescription Yes   Weight 1   Reps 10-15      Perform Capillary Blood Glucose checks as needed.  Exercise Prescription Changes:      Exercise Prescription Changes    Row Name 01/18/17 1200 01/31/17 0700 02/13/17 1400         Response to Exercise   Blood Pressure (Admit)  - 144/50 140/56     Blood Pressure (Exercise)  - 166/60 166/60     Blood Pressure (Exit)  - 140/52 146/52     Heart Rate (Admit)  - 54 bpm 56 bpm     Heart Rate (Exercise)  - 67 bpm 76 bpm     Heart Rate (Exit)  - 63 bpm 64 bpm     Rating of Perceived Exertion (Exercise)  - 11 11     Duration  - Progress to 30 minutes of  aerobic without signs/symptoms of physical distress Progress to 30 minutes of  aerobic without signs/symptoms of physical distress     Intensity  - THRR New  [phone] THRR New  93-112-131       Progression   Progression  - Continue to progress workloads to maintain intensity without signs/symptoms of physical distress. Continue to progress workloads to maintain intensity without signs/symptoms of physical distress.       Resistance Training   Training Prescription  - Yes Yes     Weight  - 1 2     Reps  - 10-15 10-15       NuStep   Level  - 3 3     SPM  - 90 90     Minutes  - 15 15     METs  - 2.4 2.3       Arm Ergometer   Level  - 2.2 2.2     Watts  - 27 23     Minutes  - 20 20     METs  - 3 3       Home Exercise Plan   Plans to continue exercise at Home (comment) Home (comment) Home (comment)     Frequency Add 2 additional days to program exercise sessions. Add 2  additional days to program exercise sessions. Add 2 additional days to program exercise sessions.     Initial Home Exercises Provided 01/18/17 01/18/17 01/18/17        Exercise Comments:      Exercise Comments    Row Name 01/18/17 1237 01/31/17 0750 02/13/17 1438       Exercise Comments Patient received his take home exercise plan today. He spoke of doing activities around the house. I educated him on THR and safe ways to be active outside of CR. He demonstrated an understanding. I encouraged him to come to me if he had any questions. Patient is  doing well in CR. He was taken off of the treadmill due to knee and joint pain.  Patient is doing well in CR. He is maintaining his levels and his watts on his machines. He has increased his resistance on his dumbbells for his warm up as well. He has been staying active with yard work and W[employer] houses        Exercise Goals and Review:      [place] Name 01/10/17 1446             Exercise Goals   Increase Physical Activity Yes       Intervention Provide advice, education, support and counseling about physical activity/exercise needs.;Develop an individualized exercise prescription for aerobic and resistive training based on initial evaluation findings, risk stratification, comorbidities and participant's personal goals.       Expected Outcomes Achievement of increased cardiorespiratory fitness and enhanced flexibility, muscular endurance and strength shown through measurements of functional capacity and personal statement of participant.       Increase Strength and Stamina Yes       Intervention Provide advice, education, support and counseling about physical activity/exercise needs.;Develop an individualized exercise prescription for aerobic and resistive training based on initial evaluation findings, risk stratification, comorbidities and participant's personal goals.       Expected Outcomes Achievement of increased  cardiorespiratory fitness and enhanced flexibility, muscular endurance and strength shown through measurements of functional capacity and personal statement of participant.       Able to understand and use rate of perceived exertion (RPE) scale Yes       Intervention Provide education and explanation on how to use RPE scale       Expected Outcomes Short Term: Able to use RPE daily in rehab to express subjective intensity level;Long Term:  Able to use RPE to guide intensity level when exercising independently       Able to understand and use Dyspnea scale Yes       Intervention Provide education and explanation on how to use Dyspnea scale       Expected Outcomes Short Term: Able to use Dyspnea scale daily in rehab to express subjective sense of shortness of breath during exertion;Long Term: Able to use Dyspnea scale to guide intensity level when exercising independently       Knowledge and understanding of Target Heart Rate Range (THRR) Yes       Intervention Provide education and explanation of THRR including how the numbers were predicted and where they are located for reference       Expected Outcomes Short Term: Able to use daily as guideline for intensity in rehab;Long Term: Able to use THRR to govern intensity when exercising independently       Able to check pulse independently Yes       Intervention Review the importance of being able to check your own pulse for safety during independent exercise       Expected Outcomes Short Term: Able to explain why pulse checking is important during independent exercise;Long Term: Able to check pulse independently and accurately       Understanding of Exercise Prescription Yes       Intervention Provide education, explanation, and written materials on patient's individual exercise prescription       Expected Outcomes Short Term: Able to explain program exercise prescription;Long Term: Able to explain home exercise prescription to exercise independently  Exercise Goals Re-Evaluation :     Exercise Goals Re-Evaluation    Row Name 01/31/17 0749 02/13/17 1436           Exercise Goal Re-Evaluation   Exercise Goals Review Increase Physical Activity;Increase Strength and Stamina;Knowledge and understanding of Target Heart Rate Range (THRR) Increase Physical Activity;Increase Strength and Stamina;Knowledge and understanding of Target Heart Rate Range (THRR)      Comments Patient is doing well in CR. He was taken off of the treadmill due to knee and joint pain.  Patient is doing well in CR. He is maintaining his levels and his watts on his machines. He has increased his resistance on his dumbbells for his warm up as well. 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      Expected Outcomes Patient wishes to gain strength and stamina throughout the program.  Patient wants to get stronger and to gain stamina           Discharge Exercise Prescription (Final Exercise Prescription Changes):     Exercise Prescription Changes - 02/13/17 1400      Response to Exercise   Blood Pressure (Admit) 140/56   Blood Pressure (Exercise) 166/60   Blood Pressure (Exit) 146/52   Heart Rate (Admit) 56 bpm   Heart Rate (Exercise) 76 bpm   Heart Rate (Exit) 64 bpm   Rating of Perceived Exertion (Exercise) 11   Duration Progress to 30 minutes of  aerobic without signs/symptoms of physical distress   Intensity THRR New  [phone]     Progression   Progression Continue to progress workloads to maintain intensity without signs/symptoms of physical distress.     Resistance Training   Training Prescription Yes   Weight 2   Reps 10-15     NuStep   Level 3   SPM 90   Minutes 15   METs 2.3     Arm Ergometer   Level 2.2   Watts 23   Minutes 20   METs 3     Home Exercise Plan   Plans to continue exercise at Home (comment)   Frequency Add 2 additional days to program exercise sessions.   Initial Home Exercises Provided  01/18/17      Nutrition:  Target Goals: Understanding of nutrition guidelines, daily intake of sodium 1500mg , cholesterol 200mg , calories 30% from fat and 7% or less from saturated fats, daily to have 5 or more servings of fruits and vegetables.  Biometrics:     Pre Biometrics - 01/10/17 1253      Pre Biometrics   Height 5\' 9"  (1.753 m)   Waist Circumference 39 inches   Hip Circumference 40 inches   Waist to Hip Ratio 0.98 %   Triceps Skinfold 13 mm   % Body Fat 27.8 %   Grip Strength 71.2 kg   Flexibility 0 in   Single Leg Stand 4 seconds       Nutrition Therapy Plan and Nutrition Goals:     Nutrition Therapy & Goals - 01/19/17 1314      Personal Nutrition Goals   Nutrition Goal For heart healthy choices add >50% of whole grains, make half their plate fruits and vegetables. Discuss the difference between starchy vegetables and leafy greens, and how leafy vegetables provide fiber, helps maintain healthy weight, helps control blood glucose, and lowers cholesterol.  Discuss purchasing fresh or frozen vegetable to reduce sodium and not to add grease, fat or sugar. Consume <18oz of red  meat per week. Consume lean cuts of meats and very little of meats high in sodium and nitrates such as pork and lunch meats. Discussed portion control for all food groups.     Additional Goals? No     Intervention Plan   Intervention Prescribe, educate and counsel regarding individualized specific dietary modifications aiming towards targeted core components such as weight, hypertension, lipid management, diabetes, heart failure and other comorbidities.;Nutrition handout(s) given to patient.   Expected Outcomes Short Term Goal: Understand basic principles of dietary content, such as calories, fat, sodium, cholesterol and nutrients.;Short Term Goal: A plan has been developed with personal nutrition goals set during dietitian appointment.;Long Term Goal: Adherence to prescribed nutrition plan.       Nutrition Discharge: Rate Your Plate Scores:     Nutrition Assessments - 01/10/17 1448      MEDFICTS Scores   Pre Score 27      Nutrition Goals Re-Evaluation:     Nutrition Goals Re-Evaluation    Row Name 02/16/17 0907             Goals   Current Weight 220 lb (99.8 kg)       Nutrition Goal Plans to explore mediterranean diet prescribed by his MD. For heart healthy choices add >50% of whole grains, make half their plate fruits and vegetables. Discuss the difference between starchy vegetables and leafy greens, and how leafy vegetables provide fiber, helps maintain healthy weight, helps control blood glucose, and lowers cholesterol.  Discuss purchasing fresh or frozen vegetable to reduce sodium and not to add grease, fat or sugar. Consume <18oz of red meat per week. Consume lean cuts of meats and very little of meats high in sodium and nitrates such as pork and lunch meats. Discussed portion control for all food groups.         Comment Patient has steadily been gaining weight since he started which he blames on smoking cessation 5 months ago. He says he is trying to eat less red meats and more vegatables. Patient said at his age, he did not see the point of trying to change his diet alot. Tried to encourage patient to make an effort to change his lifestyle and make healthy diet choices if nothing else to feel better and have more energy. Will continue to monitor.        Expected Outcome Patient will begin to work toward meeting the nutritional goals.           Nutrition Goals Discharge (Final Nutrition Goals Re-Evaluation):     Nutrition Goals Re-Evaluation - 02/16/17 0907      Goals   Current Weight 220 lb (99.8 kg)   Nutrition Goal Plans to explore mediterranean diet prescribed by his MD. For heart healthy choices add >50% of whole grains, make half their plate fruits and vegetables. Discuss the difference between starchy vegetables and leafy greens, and how leafy vegetables  provide fiber, helps maintain healthy weight, helps control blood glucose, and lowers cholesterol.  Discuss purchasing fresh or frozen vegetable to reduce sodium and not to add grease, fat or sugar. Consume <18oz of red meat per week. Consume lean cuts of meats and very little of meats high in sodium and nitrates such as pork and lunch meats. Discussed portion control for all food groups.     Comment Patient has steadily been gaining weight since he started which he blames on smoking cessation 5 months ago. He says he is trying to eat less red meats and  more vegatables. Patient said at his age, he did not see the point of trying to change his diet alot. Tried to encourage patient to make an effort to change his lifestyle and make healthy diet choices if nothing else to feel better and have more energy. Will continue to monitor.    Expected Outcome Patient will begin to work toward meeting the nutritional goals.       Psychosocial: Target Goals: Acknowledge presence or absence of significant depression and/or stress, maximize coping skills, provide positive support system. Participant is able to verbalize types and ability to use techniques and skills needed for reducing stress and depression.  Initial Review & Psychosocial Screening:     Initial Psych Review & Screening - 01/10/17 1450      Initial Review   Current issues with None Identified     Family Dynamics   Good Support System? Yes     Barriers   Psychosocial barriers to participate in program There are no identifiable barriers or psychosocial needs.     Screening Interventions   Interventions Encouraged to exercise      Quality of Life Scores:     Quality of Life - 01/10/17 1257      Quality of Life Scores   Health/Function Pre 16.23 %   Socioeconomic Pre 19.21 %   Psych/Spiritual Pre 18.64 %   Family Pre 27.1 %   GLOBAL Pre 18.94 %      PHQ-9: Recent Review Flowsheet Data    Depression screen [facility] 2/9 01/10/2017    Decreased Interest 0   Down, Depressed, Hopeless 0   PHQ - 2 Score 0   Altered sleeping 0   Tired, decreased energy 1   Change in appetite 0   Feeling bad or failure about yourself  0   Trouble concentrating 0   Moving slowly or fidgety/restless 0   Suicidal thoughts 0   PHQ-9 Score 1   Difficult doing work/chores Somewhat difficult     Interpretation of Total Score  Total Score Depression Severity:  1-4 = Minimal depression, 5-9 = Mild depression, 10-14 = Moderate depression, 15-19 = Moderately severe depression, 20-27 = Severe depression   Psychosocial Evaluation and Intervention:     Psychosocial Evaluation - 01/10/17 1450      Psychosocial Evaluation & Interventions   Interventions Encouraged to exercise with the program and follow exercise prescription   Continue Psychosocial Services  No Follow up required      Psychosocial Re-Evaluation:     Psychosocial Re-Evaluation    [place] Name 02/16/17 0915             Psychosocial Re-Evaluation   Current issues with None Identified       Comments Patient initial QOL score was 23.85 and his PHQ-9 score was 1. Patient shared during the Family matters class with the chaplian that he had lost his son several months ago but he was working through his grief. Will continue to monitor.        Expected Outcomes Patient will have no psychosocial barriers identified at discharge.        Interventions Encouraged to attend Cardiac Rehabilitation for the exercise;Stress management education;Relaxation education       Continue Psychosocial Services  No Follow up required          Psychosocial Discharge (Final Psychosocial Re-Evaluation):     Psychosocial Re-Evaluation - 02/16/17 0915      Psychosocial Re-Evaluation   Current issues with None Identified  Comments Patient initial QOL score was 23.85 and his PHQ-9 score was 1. Patient shared during the Family matters class with the chaplian that he had lost his son several months ago  but he was working through his grief. Will continue to monitor.    Expected Outcomes Patient will have no psychosocial barriers identified at discharge.    Interventions Encouraged to attend Cardiac Rehabilitation for the exercise;Stress management education;Relaxation education   Continue Psychosocial Services  No Follow up required      Vocational Rehabilitation: Provide vocational rehab assistance to qualifying candidates.   Vocational Rehab Evaluation & Intervention:     Vocational Rehab - 01/10/17 1444      Initial Vocational Rehab Evaluation & Intervention   Assessment shows need for Vocational Rehabilitation No      Education: Education Goals: Education classes will be provided on a weekly basis, covering required topics. Participant will state understanding/return demonstration of topics presented.  Learning Barriers/Preferences:     Learning Barriers/Preferences - 01/10/17 1444      Learning Barriers/Preferences   Learning Barriers Hearing   Learning Preferences Skilled Demonstration;Individual Instruction;Group Instruction      Education Topics: Hypertension, Hypertension Reduction -Define heart disease and high blood pressure. Discus how high blood pressure affects the body and ways to reduce high blood pressure.   Exercise and Your Heart -Discuss why it is important to exercise, the FITT principles of exercise, normal and abnormal responses to exercise, and how to exercise safely.   Angina -Discuss definition of angina, causes of angina, treatment of angina, and how to decrease risk of having angina.   Cardiac Medications -Review what the following cardiac medications are used for, how they affect the body, and side effects that may occur when taking the medications.  Medications include Aspirin, Beta blockers, calcium channel blockers, ACE Inhibitors, angiotensin receptor blockers, diuretics, digoxin, and antihyperlipidemics.   Congestive Heart  Failure -Discuss the definition of CHF, how to live with CHF, the signs and symptoms of CHF, and how keep track of weight and sodium intake.   Heart Disease and Intimacy -Discus the effect sexual activity has on the heart, how changes occur during intimacy as we age, and safety during sexual activity.   CARDIAC REHAB PHASE II EXERCISE from 02/15/2017 in [place]  Date  01/18/17  Educator  DC  Instruction Review Code  2- Demonstrated Understanding      Smoking Cessation / COPD -Discuss different methods to quit smoking, the health benefits of quitting smoking, and the definition of COPD.   CARDIAC REHAB PHASE II EXERCISE from 02/15/2017 in [place]  Date  01/25/17  Educator  DJ  Instruction Review Code  2- Demonstrated Understanding      Nutrition I: Fats -Discuss the types of cholesterol, what cholesterol does to the heart, and how cholesterol levels can be controlled.   CARDIAC REHAB PHASE II EXERCISE from 02/15/2017 in [place]  Date  02/01/17  Educator  DC  Instruction Review Code  2- Demonstrated Understanding      Nutrition II: Labels -Discuss the different components of food labels and how to read food label   Heart Parts and Heart Disease -Discuss the anatomy of the heart, the pathway of blood circulation through the heart, and these are affected by heart disease.   CARDIAC REHAB PHASE II EXERCISE from 02/15/2017 in [place]  Date  02/15/17  Educator  Dj  Instruction Review Code  2- Demonstrated  Understanding      Stress I: Signs and Symptoms -Discuss the causes of stress, how stress may lead to anxiety and depression, and ways to limit stress.   Stress II: Relaxation -Discuss different types of relaxation techniques to limit stress.   Warning Signs of Stroke / TIA -Discuss definition of a stroke, what the signs and symptoms are of a stroke, and how to identify  when someone is having stroke.   Knowledge Questionnaire Score:     Knowledge Questionnaire Score - 01/10/17 1444      Knowledge Questionnaire Score   Pre Score 21/24      Core Components/Risk Factors/Patient Goals at Admission:     Personal Goals and Risk Factors at Admission - 01/10/17 1449      Core Components/Risk Factors/Patient Goals on Admission    Weight Management Obesity   Personal Goal Other Yes   Personal Goal Gain streanth and stamina   Intervention Attend CR 3 x week and supplement with home exercise 2 x week   Expected Outcomes Reach personal goals.       Core Components/Risk Factors/Patient Goals Review:      Goals and Risk Factor Review    Row Name 01/10/17 1449 02/16/17 0912           Core Components/Risk Factors/Patient Goals Review   Personal Goals Review Weight Management/Obesity;Lipids Weight Management/Obesity  Get stronger; gain stamina.       Review  - Patient has completed 13 sessions gaining 7 lbs. Patient is doing well in the program. He says he feels somewhat stronger since he started. He stopped smoking 5 months ago and remains tobacco free. Will continue to monitor.       Expected Outcomes  - Patient will complete the program meeting his personal goals.          Core Components/Risk Factors/Patient Goals at Discharge (Final Review):      Goals and Risk Factor Review - 02/16/17 0912      Core Components/Risk Factors/Patient Goals Review   Personal Goals Review Weight Management/Obesity  Get stronger; gain stamina.    Review Patient has completed 13 sessions gaining 7 lbs. Patient is doing well in the program. He says he feels somewhat stronger since he started. He stopped smoking 5 months ago and remains tobacco free. Will continue to monitor.    Expected Outcomes Patient will complete the program meeting his personal goals.       ITP Comments:     ITP Comments    Row Name 01/18/17 0742 01/19/17 1314         ITP Comments  Patient new to program. Plans to start 01/18/17.  Patient attended Family Matters class with hospital chaplin to discuss how his recent diagnosis has effected his life.          Comments: ITP 30 Day REVIEW Patient doing well in the program. Will continue to monitor for progress.

## 2017-02-17 ENCOUNTER — Encounter (HOSPITAL_COMMUNITY)
Admission: RE | Admit: 2017-02-17 | Discharge: 2017-02-17 | Disposition: A | Discharge status: Home / Self Care | Payer: Non-veteran care | Source: Ambulatory Visit | Attending: Cardiology | Admitting: Cardiology

## 2017-02-17 DIAGNOSIS — Z951 Presence of aortocoronary bypass graft: Secondary | ICD-10-CM

## 2017-02-17 NOTE — Progress Notes (Signed)
Daily Session Note  Patient Details  Name: [name] MRN: [id number] Date of Birth: March 21, 1946 Referring Provider:     CARDIAC REHAB PHASE II ORIENTATION from 01/10/2017 in [place]  Referring Provider  Dr. Harl Bowie      Encounter Date: 02/17/2017  Check In:     Session Check In - 02/17/17 1103      Check-In   Location [facility]   Staff Present Aundra Dubin, RN, BSN;Sheriann Newmann Luther Parody, BS, EP, Exercise Physiologist   Supervising physician immediately available to respond to emergencies See telemetry face sheet for immediately available MD   Medication changes reported     No   Fall or balance concerns reported    No   Warm-up and Cool-down Performed as group-led instruction   Resistance Training Performed Yes     Pain Assessment   Currently in Pain? No/denies   Pain Score 0-No pain   Multiple Pain Sites No      Capillary Blood Glucose: No results found for this or any previous visit (from the past 24 hour(s)).    History  Smoking Status  . Former Smoker  . Packs/day: 1.00  . Years: 40.00  . Types: Cigarettes  . Start date: 02/20/1959  . Quit date: 09/13/2016  Smokeless Tobacco  . Never Used    Comment: 10/7 2-8 cigarettes per day. cutting back- AJ    Goals Met:  Independence with exercise equipment Exercise tolerated well No report of cardiac concerns or symptoms Strength training completed today  Goals Unmet:  Not Applicable  Comments: Check out 1200   Dr. Kate Sable is Medical Director for [facility] and [facility].

## 2017-02-20 ENCOUNTER — Encounter (HOSPITAL_COMMUNITY)
Admission: RE | Admit: 2017-02-20 | Discharge: 2017-02-20 | Disposition: A | Discharge status: Home / Self Care | Payer: Non-veteran care | Source: Ambulatory Visit | Attending: Cardiology | Admitting: Cardiology

## 2017-02-20 DIAGNOSIS — Z951 Presence of aortocoronary bypass graft: Secondary | ICD-10-CM

## 2017-02-20 NOTE — Progress Notes (Signed)
Daily Session Note  Patient Details  Name: WINFREY CHILLEMI MRN: [id number] Date of Birth: [birth year] Referring Provider:     CARDIAC REHAB PHASE II ORIENTATION from 01/10/2017 in [place]  Referring Provider  Dr. Harl Bowie      Encounter Date: 02/20/2017  Check In:     Session Check In - 02/20/17 1105      Check-In   Location [facility]   Staff Present Aundra Dubin, RN, BSN;Betha Shadix Luther Parody, BS, EP, Exercise Physiologist   Supervising physician immediately available to respond to emergencies See telemetry face sheet for immediately available MD   Medication changes reported     No   Fall or balance concerns reported    No   Warm-up and Cool-down Performed as group-led instruction   Resistance Training Performed Yes   VAD Patient? No     Pain Assessment   Currently in Pain? No/denies   Pain Score 0-No pain   Multiple Pain Sites No      Capillary Blood Glucose: No results found for this or any previous visit (from the past 24 hour(s)).    History  Smoking Status  . Former Smoker  . Packs/day: 1.00  . Years: 40.00  . Types: Cigarettes  . Start date: 02/20/1959  . Quit date: 09/13/2016  Smokeless Tobacco  . Never Used    Comment: 10/7 2-8 cigarettes per day. cutting back- AJ    Goals Met:  Independence with exercise equipment Exercise tolerated well No report of cardiac concerns or symptoms Strength training completed today  Goals Unmet:  Not Applicable  Comments: Check out 1200   Dr. Kate Sable is Medical Director for [facility] and [facility].

## 2017-02-22 ENCOUNTER — Encounter (HOSPITAL_COMMUNITY)
Admission: RE | Admit: 2017-02-22 | Discharge: 2017-02-22 | Disposition: A | Discharge status: Home / Self Care | Payer: Non-veteran care | Source: Ambulatory Visit | Attending: Cardiology | Admitting: Cardiology

## 2017-02-22 DIAGNOSIS — Z951 Presence of aortocoronary bypass graft: Secondary | ICD-10-CM

## 2017-02-22 NOTE — Progress Notes (Signed)
Daily Session Note  Patient Details  Name: [name] MRN: [id number] Date of Birth: [birth year] Referring Provider:     [place] from 01/10/2017 in [place]  Referring Provider  Dr. Harl Bowie      Encounter Date: 02/22/2017  Check In:     Session Check In - 02/22/17 1108      Check-In   Location [facility]   Staff Present Aundra Dubin, RN, BSN;Arryn Terrones Luther Parody, BS, EP, Exercise Physiologist   Supervising physician immediately available to respond to emergencies See telemetry face sheet for immediately available MD   Medication changes reported     No   Fall or balance concerns reported    No   Warm-up and Cool-down Performed as group-led instruction   Resistance Training Performed Yes   VAD Patient? No     Pain Assessment   Currently in Pain? No/denies   Pain Score 0-No pain   Multiple Pain Sites No      Capillary Blood Glucose: No results found for this or any previous visit (from the past 24 hour(s)).    History  Smoking Status  . Former Smoker  . Packs/day: 1.00  . Years: 40.00  . Types: Cigarettes  . Start date: 02/20/1959  . Quit date: 09/13/2016  Smokeless Tobacco  . Never Used    Comment: 10/7 2-8 cigarettes per day. cutting back- AJ    Goals Met:  Independence with exercise equipment Exercise tolerated well No report of cardiac concerns or symptoms Strength training completed today  Goals Unmet:  Not Applicable  Comments: Check out 1200   Dr. Kate Sable is Medical Director for [facility] and [facility] Re[facility].

## 2017-02-24 ENCOUNTER — Encounter (HOSPITAL_COMMUNITY)
Admission: RE | Admit: 2017-02-24 | Discharge: 2017-02-24 | Disposition: A | Discharge status: Home / Self Care | Payer: Non-veteran care | Source: Ambulatory Visit | Attending: Cardiology | Admitting: Cardiology

## 2017-02-24 DIAGNOSIS — Z951 Presence of aortocoronary bypass graft: Secondary | ICD-10-CM | POA: Diagnosis not present

## 2017-02-24 NOTE — Progress Notes (Signed)
Daily Session Note  Patient Details  Name: [name] MRN: [id number] Date of Birth: [birth year] Referring Provider:     [facility] from 01/10/2017 in [place]  Referring Provider  Dr. Harl Bowie      Encounter Date: 02/24/2017  Check In:     Session Check In - 02/24/17 1109      Check-In   Location [facility]   Staff Present Russella Dar, MS, EP, [facility], Exercise Physiologist;Ronnetta Currington Luther Parody, BS, EP, Exercise Physiologist   Supervising physician immediately available to respond to emergencies See telemetry face sheet for immediately available MD   Medication changes reported     No   Fall or balance concerns reported    No   Warm-up and Cool-down Performed as group-led instruction   Resistance Training Performed Yes   VAD Patient? No     Pain Assessment   Currently in Pain? No/denies   Pain Score 0-No pain   Multiple Pain Sites No      Capillary Blood Glucose: No results found for this or any previous visit (from the past 24 hour(s)).    History  Smoking Status  . Former Smoker  . Packs/day: 1.00  . Years: 40.00  . Types: Cigarettes  . Start date: 02/20/1959  . Quit date: 09/13/2016  Smokeless Tobacco  . Never Used    Comment: 10/7 2-8 cigarettes per day. cutting back- AJ    Goals Met:  Independence with exercise equipment Exercise tolerated well No report of cardiac concerns or symptoms Strength training completed today  Goals Unmet:  Not Applicable  Comments: Check out 1200   Dr. Kate Sable is Medical Director for [facility] and [facility] Re[facility].

## 2017-02-27 ENCOUNTER — Encounter (HOSPITAL_COMMUNITY)
Admission: RE | Admit: 2017-02-27 | Payer: Non-veteran care | Source: Ambulatory Visit | Attending: Cardiology | Admitting: Cardiology

## 2017-03-01 ENCOUNTER — Encounter (HOSPITAL_COMMUNITY)
Admission: RE | Admit: 2017-03-01 | Discharge: 2017-03-01 | Disposition: A | Discharge status: Home / Self Care | Payer: Non-veteran care | Source: Ambulatory Visit | Attending: Cardiology | Admitting: Cardiology

## 2017-03-01 DIAGNOSIS — Z951 Presence of aortocoronary bypass graft: Secondary | ICD-10-CM

## 2017-03-01 NOTE — Progress Notes (Signed)
Daily Session Note  Patient Details  Name: [name] MRN: [id number] Date of Birth: Oct 26, 1945 Referring Provider:     [place] from 01/10/2017 in [place]  Referring Provider  Dr. Harl Bowie      Encounter Date: 03/01/2017  Check In:     Session Check In - 03/01/17 1106      Check-In   Location [facility]   Staff Present Aundra Dubin, RN, BSN;Kyrus Hyde Luther Parody, BS, EP, Exercise Physiologist   Supervising physician immediately available to respond to emergencies See telemetry face sheet for immediately available MD   Medication changes reported     No   Fall or balance concerns reported    No   Warm-up and Cool-down Performed as group-led instruction   Resistance Training Performed Yes   VAD Patient? No     Pain Assessment   Currently in Pain? No/denies   Pain Score 0-No pain   Multiple Pain Sites No      Capillary Blood Glucose: No results found for this or any previous visit (from the past 24 hour(s)).    History  Smoking Status  . Former Smoker  . Packs/day: 1.00  . Years: 40.00  . Types: Cigarettes  . Start date: 02/20/1959  . Quit date: 09/13/2016  Smokeless Tobacco  . Never Used    Comment: 10/7 2-8 cigarettes per day. cutting back- AJ    Goals Met:  Independence with exercise equipment Exercise tolerated well No report of cardiac concerns or symptoms Strength training completed today  Goals Unmet:  Not Applicable  Comments: Patient complains of a burning sensation in his chest (upper mid sternum) during exertion. Patient Is only doing arm crank today due to that sensation. Check out 1200. Patient will be monitored and he is meeting with his cardiologist next Friday.     Dr. Kate Sable is Medical Director for [facility] and [facility] Re[facility].

## 2017-03-03 ENCOUNTER — Encounter (HOSPITAL_COMMUNITY)
Admission: RE | Admit: 2017-03-03 | Discharge: 2017-03-03 | Disposition: A | Discharge status: Home / Self Care | Payer: Non-veteran care | Source: Ambulatory Visit | Attending: Cardiology | Admitting: Cardiology

## 2017-03-03 DIAGNOSIS — Z951 Presence of aortocoronary bypass graft: Secondary | ICD-10-CM

## 2017-03-03 NOTE — Progress Notes (Signed)
Daily Session Note  Patient Details  Name: [name] MRN: [id number] Date of Birth: May 25, 1945 Referring Provider:     CARDIAC REHAB PHASE II ORIENTATION from 01/10/2017 in [place]  Referring Provider  Dr. Harl Bowie      Encounter Date: 03/03/2017  Check In:     Session Check In - 03/03/17 1100      Check-In   Location [facility]   Staff Present Diane Angelina Pih, MS, EP, [facility], Exercise Physiologist;Gregory Luther Parody, BS, EP, Exercise Physiologist;Anahlia Iseminger Wynetta Emery, RN, BSN   Supervising physician immediately available to respond to emergencies See telemetry face sheet for immediately available MD   Medication changes reported     No   Fall or balance concerns reported    No   Warm-up and Cool-down Performed as group-led instruction   Resistance Training Performed Yes   VAD Patient? No     Pain Assessment   Currently in Pain? No/denies   Pain Score 0-No pain   Multiple Pain Sites No      Capillary Blood Glucose: No results found for this or any previous visit (from the past 24 hour(s)).    History  Smoking Status  . Former Smoker  . Packs/day: 1.00  . Years: 40.00  . Types: Cigarettes  . Start date: 02/20/1959  . Quit date: 09/13/2016  Smokeless Tobacco  . Never Used    Comment: 10/7 2-8 cigarettes per day. cutting back- AJ    Goals Met:  Independence with exercise equipment Exercise tolerated well No report of cardiac concerns or symptoms Strength training completed today  Goals Unmet:  Not Applicable  Comments: Check out 1200.   Dr. Kate Sable is Medical Director for [facility] Card[facility] and Pulmonary Re[facility].

## 2017-03-06 ENCOUNTER — Encounter (HOSPITAL_COMMUNITY)
Admission: RE | Admit: 2017-03-06 | Discharge: 2017-03-06 | Disposition: A | Discharge status: Home / Self Care | Payer: Non-veteran care | Source: Ambulatory Visit | Attending: Cardiology | Admitting: Cardiology

## 2017-03-06 DIAGNOSIS — Z951 Presence of aortocoronary bypass graft: Secondary | ICD-10-CM | POA: Diagnosis not present

## 2017-03-06 NOTE — Progress Notes (Signed)
Daily Session Note  Patient Details  Name: [name] MRN: [id number] Date of Birth: August 21, 1945 Referring Provider:     CARDIAC REHAB PHASE II ORIENTATION from 01/10/2017 in [place]  Referring Provider  Dr. Harl Bowie      Encounter Date: 03/06/2017  Check In:     Session Check In - 03/06/17 1107      Check-In   Location [facility]   Staff Present Aundra Dubin, RN, BSN;Jazia Faraci Luther Parody, BS, EP, Exercise Physiologist   Supervising physician immediately available to respond to emergencies See telemetry face sheet for immediately available MD   Medication changes reported     No   Fall or balance concerns reported    No   Warm-up and Cool-down Performed as group-led instruction   Resistance Training Performed Yes   VAD Patient? No     Pain Assessment   Currently in Pain? No/denies   Pain Score 0-No pain   Multiple Pain Sites No      Capillary Blood Glucose: No results found for this or any previous visit (from the past 24 hour(s)).    History  Smoking Status  . Former Smoker  . Packs/day: 1.00  . Years: 40.00  . Types: Cigarettes  . Start date: 02/20/1959  . Quit date: 09/13/2016  Smokeless Tobacco  . Never Used    Comment: 10/7 2-8 cigarettes per day. cutting back- AJ    Goals Met:  Independence with exercise equipment Exercise tolerated well No report of cardiac concerns or symptoms Strength training completed today  Goals Unmet:  Not Applicable  Comments: Check out 1200   Dr. Kate Sable is Medical Director for [name] and Pulmonary Rehab.

## 2017-03-08 ENCOUNTER — Encounter (HOSPITAL_COMMUNITY)
Admission: RE | Admit: 2017-03-08 | Discharge: 2017-03-08 | Disposition: A | Discharge status: Home / Self Care | Payer: Non-veteran care | Source: Ambulatory Visit | Attending: Cardiology | Admitting: Cardiology

## 2017-03-08 DIAGNOSIS — Z951 Presence of aortocoronary bypass graft: Secondary | ICD-10-CM

## 2017-03-08 NOTE — Progress Notes (Signed)
Daily Session Note  Patient Details  Name: [name] MRN: [id number] Date of Birth: [birth year] Referring Provider:     CARDIAC REHAB PHASE II ORIENTATION from 01/10/2017 in [place]  Referring Provider  Dr. Harl Bowie      Encounter Date: 03/08/2017  Check In:     Session Check In - 03/08/17 1109      Check-In   Location [facility]   Staff Present Aundra Dubin, RN, BSN;Tawona Filsinger Luther Parody, BS, EP, Exercise Physiologist   Supervising physician immediately available to respond to emergencies See telemetry face sheet for immediately available MD   Medication changes reported     No   Fall or balance concerns reported    No   Warm-up and Cool-down Performed as group-led instruction   Resistance Training Performed Yes   VAD Patient? No     Pain Assessment   Currently in Pain? No/denies   Pain Score 0-No pain   Multiple Pain Sites No      Capillary Blood Glucose: No results found for this or any previous visit (from the past 24 hour(s)).    History  Smoking Status  . Former Smoker  . Packs/day: 1.00  . Years: 40.00  . Types: Cigarettes  . Start date: 02/20/1959  . Quit date: 09/13/2016  Smokeless Tobacco  . Never Used    Comment: 10/7 2-8 cigarettes per day. cutting back- AJ    Goals Met:  Independence with exercise equipment Exercise tolerated well No report of cardiac concerns or symptoms Strength training completed today  Goals Unmet:  Not Applicable  Comments: Check out 1200   Dr. Kate Sable is Medical Director for [facility] and Pulmonary Rehab.

## 2017-03-10 ENCOUNTER — Encounter: Payer: Self-pay | Admitting: Cardiology

## 2017-03-10 ENCOUNTER — Encounter (HOSPITAL_COMMUNITY): Payer: Non-veteran care

## 2017-03-10 ENCOUNTER — Ambulatory Visit (INDEPENDENT_AMBULATORY_CARE_PROVIDER_SITE_OTHER): Payer: Medicare Other | Admitting: Cardiology

## 2017-03-10 ENCOUNTER — Encounter: Payer: Self-pay | Admitting: *Deleted

## 2017-03-10 ENCOUNTER — Telehealth: Payer: Self-pay | Admitting: Cardiology

## 2017-03-10 VITALS — BP 142/60 | HR 79 | Ht 69.0 in | Wt 224.0 lb

## 2017-03-10 DIAGNOSIS — I5032 Chronic diastolic (congestive) heart failure: Secondary | ICD-10-CM | POA: Diagnosis not present

## 2017-03-10 DIAGNOSIS — I2510 Atherosclerotic heart disease of native coronary artery without angina pectoris: Secondary | ICD-10-CM | POA: Diagnosis not present

## 2017-03-10 DIAGNOSIS — R079 Chest pain, unspecified: Secondary | ICD-10-CM | POA: Diagnosis not present

## 2017-03-10 MED ORDER — METOPROLOL SUCCINATE ER 25 MG PO TB24: 37.5000 mg | ORAL_TABLET | Freq: Every day | ORAL | 3 refills | Status: DC

## 2017-03-10 NOTE — Progress Notes (Signed)
Clinical Summary Mr. Potash is a 71 y.o.male seen today for follow up of the following medical problems.   1. Chronic diastolic HF - since discharge no recent chest pain. Significant progression of LE edema and SOB over the last few days. WEight up roughly 10 lbs.  - he reports this is the worst swelling he has ever had - has been compliant with diuretic. Taking 40mg  daily has not helped.  - of note he was transiently on amiodarone postop recent CABG, needs TSH check in setting of leg edema.  - swelling did not improve with lower dose of norvasc. Had been on norvasc before without troubles, with this degree of weight gain does not appear to be the issue.     - no recent edema. Weights are stable. Limiting sodium intake.  - takes lasix 40mg  bid.   2. HTN - meds changed during recent admission with CABG -  norvasc 10, HCTZ 25,  lisionpril 40mg  was stopped during that admission - 10/26/16 we restarted norvasc 10mg , and later lisionpril 10mg  daily.  - he developed some swelling, and we lowered his norvasc to 5mg  daily and started lasix 20mg  prn, and then 40mg  prn.  - reports 7 lbs weight gain.   11/19/16 labs: Cr 1.28, BUN 22, K 3.6. Cr had been up to 1.7 in June 2018 - remains compliant with meds   3. CAD - hx of CABG in Jan 2008 (LIMA-LAD, SVG-D2, SVG-OM, SVG-RPL) - admit [facility]/14 [name] with NSTEMI, received DES to SVG-RCA. No LV gram.  - 08/2014 echo from [place] LVEF 60-65%  - had nuclear stress 2 at [facility]. Large moderate lateral defect fixed. 11/2015.  -cath at [facility] 03/18/16. LM 20%, prox LAD 100% CTO, D2 diffuse disease, LCX LIs, OM2 80%, RCA right PL segment 90%. LIMA patent, SVG-OM2 patent, SVG-D2 50% stenosis, SVG-right PL occluded. Recs were for medical management. Fairly stable findings from prior cath however SVG-RCA PL which was previously stented is now occluded.    - admit 09/2016 with NSTEMI, peak troponin 0.43. Cath as reported below. He was referred for  repeat CABG - CABG  09/19/16 with RIMA-diag, left radial to distal RCA - echo 09/2016 LVEF 50-55% - has not been on ACE-I due to poor renal function  - since discharge no recent chest pain. Has some SOB at times. Can have some SOB. Has had some LE edema. - CXR bibasilar atelectais and pleural effusion - compliant with meds  - recent chest pain during cardiac rehab. - only occurs with activity. Starts on bike for 3 minutes, burning pain uppermidchest, 5/10 in severity. If rests for 1 minutes symptoms resolve. Doesn't occur with handbike.  - reports last Wednesday episode after walking slight incline similar pain. Took NG and pain resolved. Repeat episode later that day, took another one and resolved. Pain ongoing 4-5 weeks.     Past Medical History:  Diagnosis Date  . Acute myocardial infarction of other inferior wall, initial episode of care   . Anginal pain ([place])   . Anxiety   . Arthritis   . Cancer [facility])    bladder 2013 removed, has cystoscopy 10/2016, has returned x 2  . COPD (chronic obstructive pulmonary disease) ([place])   . Coronary artery disease    Artery bypass graft Jan 2008  . Dyspnea   . Dysrhythmia   . GERD (gastroesophageal reflux disease)   . Heart murmur   . History of hiatal hernia   . Hyperlipidemia   .  Hypertension   . Hypothyroidism   . PONV (postoperative nausea and vomiting)   . Sleep apnea   . Stroke ([place])   . Tobacco user      Allergies  Allergen Reactions  . Cardizem Cd [Diltiazem Hcl Er Beads] Palpitations    "Makes heart skip"     Current Outpatient Prescriptions  Medication Sig Dispense Refill  . acetaminophen (TYLENOL) 325 MG tablet Take 2 tablets (650 mg total) by mouth every 6 (six) hours as needed for mild pain.    Marland Kitchen amLODipine (NORVASC) 10 MG tablet Take 1 tablet (10 mg total) by mouth daily.    Marland Kitchen aspirin 81 MG chewable tablet Chew 1 tablet (81 mg total) by mouth daily.    Marland Kitchen atorvastatin (LIPITOR) 40 MG tablet Take 1 tablet (40 mg  total) by mouth daily. 30 tablet 1  . clopidogrel (PLAVIX) 75 MG tablet Take 1 tablet (75 mg total) by mouth daily. 30 tablet 1  . ergocalciferol (VITAMIN D2) 50000 UNITS capsule Take 50,000 Units by mouth every Sunday.     . furosemide (LASIX) 40 MG tablet Take 40 mg by mouth 2 (two) times daily. WEIGHT >220LBS TAKE 60 AM AND 40 PM UNTIL WEIGHT COMES DOWN THEN RESUME 40 MG BID    . levothyroxine (SYNTHROID, LEVOTHROID) 25 MCG tablet Take 25 mcg by mouth daily before breakfast.    . lisinopril (PRINIVIL,ZESTRIL) 10 MG tablet Take 1 tablet (10 mg total) by mouth daily. 30 tablet 6  . magnesium oxide (MAG-OX) 400 MG tablet Take 400 mg by mouth daily.    . metoprolol succinate (TOPROL-XL) 25 MG 24 hr tablet Take 25 mg by mouth daily.    . Omega-3 Fatty Acids (RA FISH OIL) 1000 MG CAPS Take 2,000 mg by mouth 2 (two) times daily.     Marland Kitchen omeprazole (PRILOSEC) 20 MG capsule Take 40 mg by mouth daily.     . potassium chloride SA (K-DUR,KLOR-CON) 20 MEQ tablet Take 20 mEq by mouth daily.    Marland Kitchen PRESCRIPTION MEDICATION 1 each. Cortisone Injection into each knee every 4 months.     No current facility-administered medications for this visit.      Past Surgical History:  Procedure Laterality Date  . APPENDECTOMY    . CARDIAC CATHETERIZATION    . CORONARY ARTERY BYPASS GRAFT  06/14/2006   x 5  . CORONARY ARTERY BYPASS GRAFT N/A 09/19/2016   Procedure: REDO CORONARY ARTERY BYPASS GRAFTING (CABG) x two, using right internal mammary artery and left radial artery;  Surgeon: [name], MD;  Location: [place];  Service: Open Heart Surgery;  Laterality: N/A;  . FOOT SURGERY    . HERNIA REPAIR    . LEFT HEART CATH AND CORS/GRAFTS ANGIOGRAPHY N/A 09/14/2016   Procedure: Left Heart Cath and Cors/Grafts Angiography;  Surgeon: Troy Sine, MD;  Location: [place] CV LAB;  Service: Cardiovascular;  Laterality: N/A;  . LEFT HEART CATHETERIZATION WITH CORONARY/GRAFT ANGIOGRAM N/A 01/31/2014   Procedure: LEFT  HEART CATHETERIZATION WITH Beatrix Fetters;  Surgeon: Sanda Klein, MD;  Location: [place] CATH LAB;  Service: Cardiovascular;  Laterality: N/A;  . NOSE SURGERY    . PERCUTANEOUS CORONARY STENT INTERVENTION (PCI-S)  01/31/2014   Procedure: PERCUTANEOUS CORONARY STENT INTERVENTION (PCI-S);  Surgeon: Sanda Klein, MD;  Location: [facility] CATH LAB;  Service: Cardiovascular;;  . RADIAL ARTERY HARVEST Left 09/19/2016   Procedure: RADIAL ARTERY HARVEST;  Surgeon: [name], MD;  Location: [place];  Service: Open Heart Surgery;  Laterality: Left;  . TEE WITHOUT CARDIOVERSION N/A 09/19/2016   Procedure: TRANSESOPHAGEAL ECHOCARDIOGRAM (TEE);  Surgeon: [name], MD;  Location: [place];  Service: Open Heart Surgery;  Laterality: N/A;     Allergies  Allergen Reactions  . Cardizem Cd [Diltiazem Hcl Er Beads] Palpitations    "Makes heart skip"      Family History  Problem Relation Age of Onset  . Heart attack Mother   . Heart attack Brother      Social History Mr. Perrier reports that he quit smoking about 5 months ago. His smoking use included Cigarettes. He started smoking about 58 years ago. He has a 40.00 pack-year smoking history. He has never used smokeless tobacco. Mr. Gentzler reports that he does not drink alcohol.   Review of Systems CONSTITUTIONAL: No weight loss, fever, chills, weakness or fatigue.  HEENT: Eyes: No visual loss, blurred vision, double vision or yellow sclerae.No hearing loss, sneezing, congestion, runny nose or sore throat.  SKIN: No rash or itching.  CARDIOVASCULAR: per hpi RESPIRATORY: No shortness of breath, cough or sputum.  GASTROINTESTINAL: No anorexia, nausea, vomiting or diarrhea. No abdominal pain or blood.  GENITOURINARY: No burning on urination, no polyuria NEUROLOGICAL: No headache, dizziness, syncope, paralysis, ataxia, numbness or tingling in the extremities. No change in bowel or bladder control.  MUSCULOSKELETAL: No muscle, back pain, joint  pain or stiffness.  LYMPHATICS: No enlarged nodes. No history of splenectomy.  PSYCHIATRIC: No history of depression or anxiety.  ENDOCRINOLOGIC: No reports of sweating, cold or heat intolerance. No polyuria or polydipsia.  Marland Kitchen   Physical Examination Vitals:   03/10/17 1125  BP: (!) 142/60  Pulse: 79  SpO2: 93%   Vitals:   03/10/17 1125  Weight: 224 lb (101.6 kg)  Height: 5\' 9"  (1.753 m)    Gen: resting comfortably, no acute distress HEENT: no scleral icterus, pupils equal round and reactive, no palptable cervical adenopathy,  CV: RRR, 2/6 systolic murmur rusb, no jvd. +bilateral carotid bruits Resp: Clear to auscultation bilaterally GI: abdomen is soft, non-tender, non-distended, normal bowel sounds, no hepatosplenomegaly MSK: extremities are warm, no edema.  Skin: warm, no rash Neuro:  no focal deficits Psych: appropriate affect   Diagnostic Studies 01/31/14 Cath Angiographic Findings: 1. The left main coronary arteryis not stenotic, but exhibits significant atherosclerosis and bifurcates in the usual fashion into the left anterior descending artery and left circumflex coronary artery.  2. The left anterior descending arteryis a large vessel that reaches the apex and generates several proximal septals, but is occluded before any of the major diagonal branches. There is evidence of extensive luminal irregularities and mild calcification.  3. The left circumflex coronary arteryis a medium-size vessel non dominant vessel that generates two major oblique marginal arteries. There is evidence of extensive luminal irregularities and mild calcification. No hemodynamically meaningful stenoses are seen. The first OM is flush occluded. The distal OM is medium in size.  4. The right coronary arteryis a very large-size dominant vessel that generates a posterior lateral ventricular artery that is proximally occluded as well as a diffusely diseased PDA. There is evidence of extensive  luminal irregularities and mild calcification.  5. The sequential SVG to the diagonal and first OMis very irregular in caliber, but widely patent, without stenoses. The first diagonal artery is severely diseased and small in caliber. The OM branch is large and free of major stenoses.  6. The SVG to the second diagonalis widely patent and healthy, feeding a relatively large  vessel.  7. The LIMA to the LADis widely patent with both retrograde and antegrade LAD flow. There are significant stenoses both upstream and downstream of the anastomosis, but there is excellent flow.  8. The SVG to RCA-PLA has a severe, ulcerated stenosis in its distal third, at least 95% in severity, but with TIMI 3 flow. It is the culprit lesion.  5. The left ventriclewas not injected.There is no aortic valve stenosis by pullback. The left ventricular end-diastolic pressure is 24 mm Hg.   IMPRESSIONS: High grade unstable stenosis in the SVG to PLA causing NSTEMI  RECOMMENDATION: Urgent PCI, preferably with filter wire.   IMPRESSIONS: 1. Successful PCI of the SVG to right coronary artery graft with a 3.0 x 23 Alpine drug-eluting stent - dilated to 3.25 mm in diameter, using a distal embolic protection device, 4.0 spider. 2.         04/2014 Carotid US Moderate bilateral stenosis  12/2011 Abdominal US No aortic aneurysm   Jan 2013 [facility] LVEF 65%      08/2014 Echo [facility] LVEF 60-65%, aortic valve sclerosis.   cath at [facility] 03/18/16. LM 20%, prox LAD 100% CTO, D2 diffuse disease, LCX LIs, OM2 80%, RCA right PL segment 90%. LIMA patent, SVG-OM2 patent, SVG-D2 50% stenosis, SVG-right PL occluded. Recs were for medical management.  09/2016 cath  The left ventricular ejection fraction is 45-50% by visual estimate.  LV end diastolic pressure is normal.  There is mild left ventricular systolic dysfunction.  Dist RCA lesion, 90 %stenosed.  SVG.  Dist Graft-1 lesion, 95  %stenosed.  Dist Graft-2 lesion, 50 %stenosed.  Prox Graft lesion, 80 %stenosed.  Ost 2nd Mrg lesion, 100 %stenosed.  SVG.  Mid Graft lesion, 100 %stenosed.  Prox Graft lesion, 100 %stenosed.  Seq SVG-.  Origin lesion, 99 %stenosed.  Prox LAD lesion, 100 %stenosed.  Prox Graft to Dist Graft lesion, 25 %stenosed.  Ost LAD to Prox LAD lesion, 80 %stenosed.  Multivessel native CAD with diffuse 80% proximal LAD stenosis and occlusion of the LAD after a small first diagonal vessel; occluded marginal branch arising from the mid AV groove circumflex with an otherwise patent AV groove circumflex extending distally; RCA with 20% mid narrowing normal PDA, but with 90% PLA stenosis.  Patent LIMA graft supplying the mid LAD.  SVG sequentially supplying the diagonal-1 and OM vessel with 99% near ostial graft stenosis and diffuse luminal irregularity in the body of the graft prior to the initial anastomosis.  SVG supplying the diagonal 2 vessel with diffuse proximal to mid graft stenosis, 95% ulcerated plaque stenosis in the distal third of the graft and 50% eccentric stenosis prior to its anastomosis.  SVG which had previously supplied the PLA and had been stented, is occluded proximally.  Mild-to-moderate LV dysfunction with mid distal anterolateral hypocontractility and an ejection fraction of 45-50%.  RECOMMENDATION: The patient had previously undergone CABG surgery by Dr. Cyndia Bent in 2008. Dr. Cyndia Bent will be re-consulted for evaluation and consideration of possible redo CABG revascularization surgery.   09/2016 echo Study Conclusions  - Left ventricle: The cavity size was normal. Wall thickness was increased in a pattern of mild LVH. Systolic function was normal. The estimated ejection fraction was in the range of 50% to 55%. Abnormal GLPSS at -15%, inferoseptal strain abnormality. Basal to mid inferior/inferoseptal hypokinesis. Doppler parameters  are consistent with abnormal left ventricular relaxation (grade 1 diastolic dysfunction). The E/e&' ratio is between 8-15, suggesting indeterminate LV filling pressure. - Aortic valve: Poorly visualized.  Mild stenosis. Mildly calcified leaflets. There was mild regurgitation. Mean gradient (S): 13 mm Hg. Peak gradient (S): 28 mm Hg. - Left atrium: The atrium was normal in size. - Inferior vena cava: The vessel was normal in size. The respirophasic diameter changes were in the normal range (>= 50%), consistent with normal central venous pressure.  Impressions:  - LVEF 50-55%, mild LVH, basal to mid inferior, inferoseptal hypokinesis, abnormal GLPSS at -15%, grade 1 DD with indeterminate LV filling pressure, mild calcific aortic stenosis - mean gradient of 13 mmHg, normal LA size, normal IVC.  09/2016 Carotid US/ABI preCABG Summary:  1. Carotid Duplex Evaluation - Right internal carotid artery 1-39% stenosis, left internal carotid artery 40-59% stenosis. 2. Palmar Arch Evaluation - Right radial artery decreased greater than 50 percent with compression and right ulnar remains within normal limits with compression. . Left Doppler waveforms remain within normal limits with radial and ulnar compression. 3. ABI Evaluation - Right ABI appeared normal at rest. Left ABI is suggestive of moderate arterial disease at rest.    12/2016 echo Study Conclusions  - Left ventricle: The cavity size was normal. Wall thickness was normal. Systolic function was vigorous. The estimated ejection fraction was in the range of 65% to 70%. Wall motion was normal; there were no regional wall motion abnormalities. - Aortic valve: Moderately calcified annulus. Trileaflet; mildly calcified leaflets. There was mild regurgitation. Mean gradient (S): 12 mm Hg. Peak gradient (S): 24 mm Hg. Valve area (VTI): 2.44 cm^2. - Mitral valve: Mildly calcified annulus.  There was trivial regurgitation. - Left atrium: The atrium was mildly dilated. - Right atrium: Central venous pressure (est): 3 mm Hg. - Atrial septum: No defect or patent foramen ovale was identified. - Tricuspid valve: There was trivial regurgitation. - Pulmonary arteries: PA peak pressure: 28 mm Hg (S). - Pericardium, extracardiac: There was no pericardial effusion.  Impressions:  - Normal LV wall thickness with LVEF 65-70% and grade 2 diastolic dysfunction. Mild left atrial enlargement. Mildly calcified mitral annulus with trivial mitral regurgitation. Moderately sclerotic aortic valve with mild aortic regurgitation. Trivial tricuspid regurgitation with PASP estimated 28 mmHg.      Assessment and Plan  1. Chronic diastolic HF - appears euvolemic, continue current meds  2. CAD - patient with recent repeat CABG - recent chest pain. We will obtain lexiscan MPI to further evaluate and risk stratify. Increase Toprol XL to 37.5 mg daily for additional antianginal effects. If low to intermediate risk would anticipate titrating antianginal therapy  3. HTN - elevated in clinic, increasing Toprol as described above   F/u pending test results       Arnoldo Lenis, M.D.

## 2017-03-10 NOTE — Patient Instructions (Signed)
Medication Instructions:   Increase Toprol XL to 37.5mg  daily.  Continue all other medications.    Labwork: none  Testing/Procedures:  Your physician has requested that you have a lexiscan myoview. For further information please visit [email or web address]. Please follow instruction sheet, as given.  Office will contact with results via phone or letter.    Follow-Up: Pending test results.   Any Other Special Instructions Will Be Listed Below (If Applicable).  If you need a refill on your cardiac medications before your next appointment, please call your pharmacy.

## 2017-03-10 NOTE — Telephone Encounter (Signed)
Pre-cert Verification for the following procedure   Lexiscan scheduled for 03-14-17

## 2017-03-13 ENCOUNTER — Encounter (HOSPITAL_COMMUNITY)
Admission: RE | Admit: 2017-03-13 | Discharge: 2017-03-13 | Disposition: A | Discharge status: Home / Self Care | Payer: Non-veteran care | Source: Ambulatory Visit | Attending: Cardiology | Admitting: Cardiology

## 2017-03-14 ENCOUNTER — Encounter (HOSPITAL_BASED_OUTPATIENT_CLINIC_OR_DEPARTMENT_OTHER)
Admission: RE | Admit: 2017-03-14 | Discharge: 2017-03-14 | Disposition: A | Discharge status: Home / Self Care | Payer: Medicare Other | Source: Ambulatory Visit | Attending: Cardiology | Admitting: Cardiology

## 2017-03-14 ENCOUNTER — Encounter (HOSPITAL_COMMUNITY)
Admission: RE | Admit: 2017-03-14 | Discharge: 2017-03-14 | Disposition: A | Discharge status: Home / Self Care | Payer: Medicare Other | Source: Ambulatory Visit | Attending: Cardiology | Admitting: Cardiology

## 2017-03-14 ENCOUNTER — Encounter (HOSPITAL_COMMUNITY): Payer: Self-pay

## 2017-03-14 DIAGNOSIS — R079 Chest pain, unspecified: Secondary | ICD-10-CM

## 2017-03-14 LAB — NM MYOCAR MULTI W/SPECT W/WALL MOTION / EF
CHL CUP RESTING HR STRESS: 45 {beats}/min
CSEPPHR: 83 {beats}/min
LHR: 0.72
LVDIAVOL: 149 mL (ref 62–150)
LVSYSVOL: 70 mL
SDS: 8
SRS: 7
SSS: 15
TID: 1.02

## 2017-03-14 MED ORDER — TECHNETIUM TC 99M TETROFOSMIN IV KIT
30.0000 | PACK | Freq: Once | INTRAVENOUS | Status: AC | PRN
  Administered 2017-03-14: 33 via INTRAVENOUS

## 2017-03-14 MED ORDER — REGADENOSON 0.4 MG/5ML IV SOLN
INTRAVENOUS | Status: AC
  Administered 2017-03-14: 0.4 mg via INTRAVENOUS
  Filled 2017-03-14: qty 5

## 2017-03-14 MED ORDER — TECHNETIUM TC 99M TETROFOSMIN IV KIT
10.0000 | PACK | Freq: Once | INTRAVENOUS | Status: AC | PRN
  Administered 2017-03-14: 11 via INTRAVENOUS

## 2017-03-14 MED ORDER — SODIUM CHLORIDE 0.9% FLUSH
INTRAVENOUS | Status: AC
  Administered 2017-03-14: 10 mL via INTRAVENOUS
  Filled 2017-03-14: qty 10

## 2017-03-15 ENCOUNTER — Encounter (HOSPITAL_COMMUNITY): Admission: RE | Admit: 2017-03-15 | Payer: Non-veteran care | Source: Ambulatory Visit

## 2017-03-16 ENCOUNTER — Telehealth: Payer: Self-pay

## 2017-03-16 NOTE — Telephone Encounter (Signed)
Patient notified. Routed to PCP 

## 2017-03-16 NOTE — Telephone Encounter (Signed)
-----   Message from Arnoldo Lenis, MD sent at 03/15/2017  4:43 PM EDT ----- Stress test does show a small to medium area of the heart that appears not to get enough blood. As we talked about in clinic I suspect this is related to the complex blood flow he has based on his multiple bypass surgeries, at this time I think a new blockage is less likely. Main focus at this time will be medical therapy, including the recent change we made. I think he should continue with cardiac rehab  Carlyle Dolly MD

## 2017-03-16 NOTE — Progress Notes (Signed)
Cardiac Individual Treatment Plan  Patient Details  Name: [name] MRN: [id number] Date of Birth: Dec 14, 1945 Referring Provider:     [facility] from 01/10/2017 in [place]  Referring Provider  Dr. Harl Bowie      Initial Encounter Date:    CARDIAC REHAB PHASE II ORIENTATION from 01/10/2017 in [facility]  Date  01/10/17  Referring Provider  Dr. Harl Bowie      Visit Diagnosis: S/P CABG x 2  Patient's Home Medications on Admission:  Current Outpatient Prescriptions:  .  acetaminophen (TYLENOL) 325 MG tablet, Take 2 tablets (650 mg total) by mouth every 6 (six) hours as needed for mild pain., Disp: , Rfl:  .  amLODipine (NORVASC) 10 MG tablet, Take 1 tablet (10 mg total) by mouth daily., Disp: , Rfl:  .  aspirin 81 MG chewable tablet, Chew 1 tablet (81 mg total) by mouth daily., Disp: , Rfl:  .  atorvastatin (LIPITOR) 40 MG tablet, Take 1 tablet (40 mg total) by mouth daily., Disp: 30 tablet, Rfl: 1 .  clopidogrel (PLAVIX) 75 MG tablet, Take 1 tablet (75 mg total) by mouth daily., Disp: 30 tablet, Rfl: 1 .  ergocalciferol (VITAMIN D2) 50000 UNITS capsule, Take 50,000 Units by mouth every Sunday. , Disp: , Rfl:  .  furosemide (LASIX) 40 MG tablet, Take 40 mg by mouth 2 (two) times daily. WEIGHT >220LBS TAKE 60 AM AND 40 PM UNTIL WEIGHT COMES DOWN THEN RESUME 40 MG BID, Disp: , Rfl:  .  levothyroxine (SYNTHROID, LEVOTHROID) 25 MCG tablet, Take 25 mcg by mouth daily before breakfast., Disp: , Rfl:  .  lisinopril (PRINIVIL,ZESTRIL) 10 MG tablet, Take 10 mg by mouth daily., Disp: , Rfl:  .  magnesium oxide (MAG-OX) 400 MG tablet, Take 400 mg by mouth daily., Disp: , Rfl:  .  metoprolol succinate (TOPROL-XL) 25 MG 24 hr tablet, Take 1.5 tablets (37.5 mg total) by mouth daily., Disp: 135 tablet, Rfl: 3 .  Omega-3 Fatty Acids (RA FISH OIL) 1000 MG CAPS, Take 2,000 mg by mouth 2 (two) times daily. , Disp: , Rfl:  .  omeprazole  (PRILOSEC) 20 MG capsule, Take 40 mg by mouth daily. , Disp: , Rfl:  .  potassium chloride SA (K-DUR,KLOR-CON) 20 MEQ tablet, Take 20 mEq by mouth daily., Disp: , Rfl:  .  PRESCRIPTION MEDICATION, 1 each. Cortisone Injection into each knee every 4 months., Disp: , Rfl:   Past Medical History: Past Medical History:  Diagnosis Date  . Acute myocardial infarction of other inferior wall, initial episode of care   . Anginal pain ([place])   . Anxiety   . Arthritis   . Cancer [name])    bladder 2013 removed, has cystoscopy 10/2016, has returned x 2  . COPD (chronic obstructive pulmonary disease) ([place])   . Coronary artery disease    Artery bypass graft Jan 2008  . Dyspnea   . Dysrhythmia   . GERD (gastroesophageal reflux disease)   . Heart murmur   . History of hiatal hernia   . Hyperlipidemia   . Hypertension   . Hypothyroidism   . PONV (postoperative nausea and vomiting)   . Sleep apnea   . Stroke ([place])   . Tobacco user     Tobacco Use: History  Smoking Status  . Former Smoker  . Packs/day: 1.00  . Years: 40.00  . Types: Cigarettes  . Start date: 02/20/1959  . Quit date: 09/13/2016  Smokeless  Tobacco  . Never Used    Comment: 10/7 2-8 cigarettes per day. cutting back- AJ    Labs: Recent Review Flowsheet Data    Labs for ITP Cardiac and Pulmonary Rehab Latest Ref Rng & Units 09/19/2016 09/19/2016 09/19/2016 09/19/2016 09/20/2016   Cholestrol 0 - 200 mg/dL - - - - -   LDLCALC 0 - 99 mg/dL - - - - -   LDLDIRECT mg/dL - - - - -   HDL >40 mg/dL - - - - -   Trlycerides <150 mg/dL - - - - -   Hemoglobin A1c 4.8 - 5.6 % - - - - -   PHART 7.350 - 7.450 7.360 - 7.333(L) 7.364 -   PCO2ART 32.0 - 48.0 mmHg 45.1 - 45.6 41.8 -   HCO3 20.0 - 28.0 mmol/L 25.7 - 24.4 23.9 -   TCO2 0 - 100 mmol/L 27 25 26 25 26    ACIDBASEDEF 0.0 - 2.0 mmol/L - - 2.0 1.0 -   O2SAT % 98.0 - 94.0 95.0 -      Capillary Blood Glucose: Lab Results  Component Value Date   GLUCAP 103 (H) 09/23/2016   GLUCAP 99  09/22/2016   GLUCAP 108 (H) 09/22/2016   GLUCAP 102 (H) 09/22/2016   GLUCAP 92 09/22/2016     Exercise Target Goals:    Exercise Program Goal: Individual exercise prescription set with THRR, safety & activity barriers. Participant demonstrates ability to understand and report RPE using BORG scale, to self-measure pulse accurately, and to acknowledge the importance of the exercise prescription.  Exercise Prescription Goal: Starting with aerobic activity 30 plus minutes a day, 3 days per week for initial exercise prescription. Provide home exercise prescription and guidelines that participant acknowledges understanding prior to discharge.  Activity Barriers & Risk Stratification:     Activity Barriers & Cardiac Risk Stratification - 01/10/17 1503      Activity Barriers & Cardiac Risk Stratification   Activity Barriers --  has knee pain with exertion   Cardiac Risk Stratification High      6 Minute Walk:     6 Minute Walk    Row Name 01/10/17 1403         6 Minute Walk   Phase Initial     Distance 1150 feet     Distance % Change 0 %     Distance Feet Change 0 ft     Walk Time 6 minutes     # of Rest Breaks 0     MPH 2.17     METS 2.66     RPE 12     Perceived Dyspnea  12     VO2 Peak 8.33     Symptoms No     Resting HR 62 bpm     Resting BP 122/52     Resting Oxygen Saturation  97 %     Exercise Oxygen Saturation  during 6 min walk 92 %     Max Ex. HR 90 bpm     Max Ex. BP 138/64     2 Minute Post BP 126/58        Oxygen Initial Assessment:   Oxygen Re-Evaluation:   Oxygen Discharge (Final Oxygen Re-Evaluation):   Initial Exercise Prescription:     Initial Exercise Prescription - 01/10/17 1400      Date of Initial Exercise RX and Referring Provider   Date 01/10/17   Referring Provider Dr. Harl Bowie     Treadmill   MPH  1.5   Grade 0   Minutes 15   METs 2.2     NuStep   Level 2   SPM 76   Minutes 20   METs 2     Prescription Details    Frequency (times per week) 3   Duration Progress to 30 minutes of continuous aerobic without signs/symptoms of physical distress     Intensity   THRR 40-80% of Max Heartrate [phone]   Ratings of Perceived Exertion 11-13   Perceived Dyspnea 0-4     Progression   Progression Continue progressive overload as per policy without signs/symptoms or physical distress.     Resistance Training   Training Prescription Yes   Weight 1   Reps 10-15      Perform Capillary Blood Glucose checks as needed.  Exercise Prescription Changes:      Exercise Prescription Changes    Row Name 01/18/17 1200 01/31/17 0700 02/13/17 1400 02/27/17 1400 03/10/17 1400     Response to Exercise   Blood Pressure (Admit)  - 144/50 140/56 146/52 146/50   Blood Pressure (Exercise)  - 166/60 166/60 168/60 160/60   Blood Pressure (Exit)  - 140/52 146/52 136/50 142/56   Heart Rate (Admit)  - 54 bpm 56 bpm 54 bpm 56 bpm   Heart Rate (Exercise)  - 67 bpm 76 bpm 82 bpm 66 bpm   Heart Rate (Exit)  - 63 bpm 64 bpm 64 bpm 58 bpm   Rating of Perceived Exertion (Exercise)  - 11 11 11 11    Duration  - Progress to 30 minutes of  aerobic without signs/symptoms of physical distress Progress to 30 minutes of  aerobic without signs/symptoms of physical distress Progress to 30 minutes of  aerobic without signs/symptoms of physical distress Progress to 30 minutes of  aerobic without signs/symptoms of physical distress   Intensity  - THRR New  92-112-131 THRR New  93-112-131 THRR New  92-112-131 THRR New  [phone]     Progression   Progression  - Continue to progress workloads to maintain intensity without signs/symptoms of physical distress. Continue to progress workloads to maintain intensity without signs/symptoms of physical distress. Continue to progress workloads to maintain intensity without signs/symptoms of physical distress. Continue to progress workloads to maintain intensity without signs/symptoms of physical  distress.     Resistance Training   Training Prescription  - Yes Yes Yes Yes   Weight  - 1 2 3 4    Reps  - 10-15 10-15 10-15 10-15     NuStep   Level  - 3 3 3 3    SPM  - 90 90 98 96   Minutes  - 15 15 15 15    METs  - 2.4 2.3 2.5 2.5     Arm Ergometer   Level  - 2.2 2.2 2.3 2.5   Watts  - 27 23 28 28    Minutes  - 20 20 20 2    METs  - 3 3 3  3.2     Home Exercise Plan   Plans to continue exercise at Home (comment) Home (comment) Home (comment) Home (comment) Home (comment)   Frequency Add 2 additional days to program exercise sessions. Add 2 additional days to program exercise sessions. Add 2 additional days to program exercise sessions. Add 2 additional days to program exercise sessions. Add 2 additional days to program exercise sessions.   Initial Home Exercises Provided 01/18/17 01/18/17 01/18/17 01/18/17 01/18/17      Exercise Comments:  Exercise Comments    Row Name 01/18/17 1237 01/31/17 0750 02/13/17 1438 02/27/17 1456 03/10/17 1419   Exercise Comments Patient received his take home exercise plan today. He spoke of doing activities around the house. I educated him on THR and safe ways to be active outside of CR. He demonstrated an understanding. I encouraged him to come to me if he had any questions. Patient is doing well in CR. He was taken off of the treadmill due to knee and joint pain.  Patient is doing well in CR. He is maintaining his levels and his watts on his machines. He has increased his resistance on his dumbbells for his warm up as well. He has been staying active with yard work and W[employer] old houses Patient is doing well in CR. Patient is doing well in CR. He has increased his level on th earm ergometer and has maintained his SPMs on both machines. He states that he feels that the program is helping him.      Exercise Goals and Review:      Exercise Goals    Row Name 01/10/17 1446             Exercise Goals   Increase Physical Activity Yes        Intervention Provide advice, education, support and counseling about physical activity/exercise needs.;Develop an individualized exercise prescription for aerobic and resistive training based on initial evaluation findings, risk stratification, comorbidities and participant's personal goals.       Expected Outcomes Achievement of increased cardiorespiratory fitness and enhanced flexibility, muscular endurance and strength shown through measurements of functional capacity and personal statement of participant.       Increase Strength and Stamina Yes       Intervention Provide advice, education, support and counseling about physical activity/exercise needs.;Develop an individualized exercise prescription for aerobic and resistive training based on initial evaluation findings, risk stratification, comorbidities and participant's personal goals.       Expected Outcomes Achievement of increased cardiorespiratory fitness and enhanced flexibility, muscular endurance and strength shown through measurements of functional capacity and personal statement of participant.       Able to understand and use rate of perceived exertion (RPE) scale Yes       Intervention Provide education and explanation on how to use RPE scale       Expected Outcomes Short Term: Able to use RPE daily in rehab to express subjective intensity level;Long Term:  Able to use RPE to guide intensity level when exercising independently       Able to understand and use Dyspnea scale Yes       Intervention Provide education and explanation on how to use Dyspnea scale       Expected Outcomes Short Term: Able to use Dyspnea scale daily in rehab to express subjective sense of shortness of breath during exertion;Long Term: Able to use Dyspnea scale to guide intensity level when exercising independently       Knowledge and understanding of Target Heart Rate Range (THRR) Yes       Intervention Provide education and explanation of THRR including how  the numbers were predicted and where they are located for reference       Expected Outcomes Short Term: Able to use daily as guideline for intensity in rehab;Long Term: Able to use THRR to govern intensity when exercising independently       Able to check pulse independently Yes       Intervention Review the  importance of being able to check your own pulse for safety during independent exercise       Expected Outcomes Short Term: Able to explain why pulse checking is important during independent exercise;Long Term: Able to check pulse independently and accurately       Understanding of Exercise Prescription Yes       Intervention Provide education, explanation, and written materials on patient's individual exercise prescription       Expected Outcomes Short Term: Able to explain program exercise prescription;Long Term: Able to explain home exercise prescription to exercise independently          Exercise Goals Re-Evaluation :     Exercise Goals Re-Evaluation    Row Name 01/31/17 0749 02/13/17 1436 03/10/17 1418         Exercise Goal Re-Evaluation   Exercise Goals Review Increase Physical Activity;Increase Strength and Stamina;Knowledge and understanding of Target Heart Rate Range (THRR) Increase Physical Activity;Increase Strength and Stamina;Knowledge and understanding of Target Heart Rate Range (THRR) Increase Strength and Stamina;Increase Physical Activity;Knowledge and understanding of Target Heart Rate Range (THRR)     Comments Patient is doing well in CR. He was taken off of the treadmill due to knee and joint pain.  Patient is doing well in CR. He is maintaining his levels and his watts on his machines. He has increased his resistance on his dumbbells for his warm up as well. 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 Patient is doing well in CR. He has increased his level on th earm ergometer and has maintained his SPMs on both machines. He states that he feels  that the program is helping him.      Expected Outcomes Patient wishes to gain strength and stamina throughout the program.  Patient wants to get stronger and to gain stamina  Patient wants to get stronger and to gain more stamina          Discharge Exercise Prescription (Final Exercise Prescription Changes):     Exercise Prescription Changes - 03/10/17 1400      Response to Exercise   Blood Pressure (Admit) 146/50   Blood Pressure (Exercise) 160/60   Blood Pressure (Exit) 142/56   Heart Rate (Admit) 56 bpm   Heart Rate (Exercise) 66 bpm   Heart Rate (Exit) 58 bpm   Rating of Perceived Exertion (Exercise) 11   Duration Progress to 30 minutes of  aerobic without signs/symptoms of physical distress   Intensity THRR New  [phone]     Progression   Progression Continue to progress workloads to maintain intensity without signs/symptoms of physical distress.     Resistance Training   Training Prescription Yes   Weight 4   Reps 10-15     NuStep   Level 3   SPM 96   Minutes 15   METs 2.5     Arm Ergometer   Level 2.5   Watts 28   Minutes 2   METs 3.2     Home Exercise Plan   Plans to continue exercise at Home (comment)   Frequency Add 2 additional days to program exercise sessions.   Initial Home Exercises Provided 01/18/17      Nutrition:  Target Goals: Understanding of nutrition guidelines, daily intake of sodium 1500mg , cholesterol 200mg , calories 30% from fat and 7% or less from saturated fats, daily to have 5 or more servings of fruits and vegetables.  Biometrics:     Pre Biometrics - 01/10/17 1253  Pre Biometrics   Height 5\' 9"  (1.753 m)   Waist Circumference 39 inches   Hip Circumference 40 inches   Waist to Hip Ratio 0.98 %   Triceps Skinfold 13 mm   % Body Fat 27.8 %   Grip Strength 71.2 kg   Flexibility 0 in   Single Leg Stand 4 seconds       Nutrition Therapy Plan and Nutrition Goals:     Nutrition Therapy & Goals - 01/19/17 1314       Personal Nutrition Goals   Nutrition Goal For heart healthy choices add >50% of whole grains, make half their plate fruits and vegetables. Discuss the difference between starchy vegetables and leafy greens, and how leafy vegetables provide fiber, helps maintain healthy weight, helps control blood glucose, and lowers cholesterol.  Discuss purchasing fresh or frozen vegetable to reduce sodium and not to add grease, fat or sugar. Consume <18oz of red meat per week. Consume lean cuts of meats and very little of meats high in sodium and nitrates such as pork and lunch meats. Discussed portion control for all food groups.     Additional Goals? No     Intervention Plan   Intervention Prescribe, educate and counsel regarding individualized specific dietary modifications aiming towards targeted core components such as weight, hypertension, lipid management, diabetes, heart failure and other comorbidities.;Nutrition handout(s) given to patient.   Expected Outcomes Short Term Goal: Understand basic principles of dietary content, such as calories, fat, sodium, cholesterol and nutrients.;Short Term Goal: A plan has been developed with personal nutrition goals set during dietitian appointment.;Long Term Goal: Adherence to prescribed nutrition plan.      Nutrition Discharge: Rate Your Plate Scores:     Nutrition Assessments - 01/10/17 1448      MEDFICTS Scores   Pre Score 27      Nutrition Goals Re-Evaluation:     Nutrition Goals Re-Evaluation    Row Name 02/16/17 0907 03/16/17 1446           Goals   Current Weight 220 lb (99.8 kg) 224 lb 8 oz (101.8 kg)      Nutrition Goal Plans to explore mediterranean diet prescribed by his MD. For heart healthy choices add >50% of whole grains, make half their plate fruits and vegetables. Discuss the difference between starchy vegetables and leafy greens, and how leafy vegetables provide fiber, helps maintain healthy weight, helps control blood glucose, and  lowers cholesterol.  Discuss purchasing fresh or frozen vegetable to reduce sodium and not to add grease, fat or sugar. Consume <18oz of red meat per week. Consume lean cuts of meats and very little of meats high in sodium and nitrates such as pork and lunch meats. Discussed portion control for all food groups.   For heart healthy choices add >50% of whole grains, make half their plate fruits and vegetables. Discuss the difference between starchy vegetables and leafy greens, and how leafy vegetables provide fiber, helps maintain healthy weight, helps control blood glucose, and lowers cholesterol.  Discuss purchasing fresh or frozen vegetable to reduce sodium and not to add grease, fat or sugar. Consume <18oz of red meat per week. Consume lean cuts of meats and very little of meats high in sodium and nitrates such as pork and lunch meats. Discussed portion control for all food groups.      Comment Patient has steadily been gaining weight since he started which he blames on smoking cessation 5 months ago. He says he is trying to  eat less red meats and more vegatables. Patient said at his age, he did not see the point of trying to change his diet alot. Tried to encourage patient to make an effort to change his lifestyle and make healthy diet choices if nothing else to feel better and have more energy. Will continue to monitor.  Patient has completed 21 sessions gaining 2 lbs since last 30 day review and 11.4 lbs overall. His cardiologist has made some adjustments in his diuretic. Patient says he does not plan on changing his diet at this point in his life. Will continue to monitor.       Expected Outcome Patient will begin to work toward meeting the nutritional goals.  Patient will maintain his current weight.          Nutrition Goals Discharge (Final Nutrition Goals Re-Evaluation):     Nutrition Goals Re-Evaluation - 03/16/17 1446      Goals   Current Weight 224 lb 8 oz (101.8 kg)   Nutrition Goal For  heart healthy choices add >50% of whole grains, make half their plate fruits and vegetables. Discuss the difference between starchy vegetables and leafy greens, and how leafy vegetables provide fiber, helps maintain healthy weight, helps control blood glucose, and lowers cholesterol.  Discuss purchasing fresh or frozen vegetable to reduce sodium and not to add grease, fat or sugar. Consume <18oz of red meat per week. Consume lean cuts of meats and very little of meats high in sodium and nitrates such as pork and lunch meats. Discussed portion control for all food groups.   Comment Patient has completed 21 sessions gaining 2 lbs since last 30 day review and 11.4 lbs overall. His cardiologist has made some adjustments in his diuretic. Patient says he does not plan on changing his diet at this point in his life. Will continue to monitor.    Expected Outcome Patient will maintain his current weight.       Psychosocial: Target Goals: Acknowledge presence or absence of significant depression and/or stress, maximize coping skills, provide positive support system. Participant is able to verbalize types and ability to use techniques and skills needed for reducing stress and depression.  Initial Review & Psychosocial Screening:     Initial Psych Review & Screening - 01/10/17 1450      Initial Review   Current issues with None Identified     Family Dynamics   Good Support System? Yes     Barriers   Psychosocial barriers to participate in program There are no identifiable barriers or psychosocial needs.     Screening Interventions   Interventions Encouraged to exercise      Quality of Life Scores:     Quality of Life - 01/10/17 1257      Quality of Life Scores   Health/Function Pre 16.23 %   Socioeconomic Pre 19.21 %   Psych/Spiritual Pre 18.64 %   Family Pre 27.1 %   GLOBAL Pre 18.94 %      PHQ-9: Recent Review Flowsheet Data    Depression screen [facility] 2/9 01/10/2017   Decreased  Interest 0   Down, Depressed, Hopeless 0   PHQ - 2 Score 0   Altered sleeping 0   Tired, decreased energy 1   Change in appetite 0   Feeling bad or failure about yourself  0   Trouble concentrating 0   Moving slowly or fidgety/restless 0   Suicidal thoughts 0   PHQ-9 Score 1   Difficult doing work/chores  Somewhat difficult     Interpretation of Total Score  Total Score Depression Severity:  1-4 = Minimal depression, 5-9 = Mild depression, 10-14 = Moderate depression, 15-19 = Moderately severe depression, 20-27 = Severe depression   Psychosocial Evaluation and Intervention:     Psychosocial Evaluation - 01/10/17 1450      Psychosocial Evaluation & Interventions   Interventions Encouraged to exercise with the program and follow exercise prescription   Continue Psychosocial Services  No Follow up required      Psychosocial Re-Evaluation:     Psychosocial Re-Evaluation    [place] Name 02/16/17 0915 03/16/17 1459           Psychosocial Re-Evaluation   Current issues with None Identified None Identified      Comments Patient initial QOL score was 23.85 and his PHQ-9 score was 1. Patient shared during the Family matters class with the chaplian that he had lost his son several months ago but he was working through his grief. Will continue to monitor.  Patient continues to work through his grief over his son. Will continue to monitor.       Expected Outcomes Patient will have no psychosocial barriers identified at discharge.  Patient will have no psychosocial barriers identified at discharge.       Interventions Encouraged to attend Cardiac Rehabilitation for the exercise;Stress management education;Relaxation education Encouraged to attend Cardiac Rehabilitation for the exercise;Relaxation education;Stress management education      Continue Psychosocial Services  No Follow up required No Follow up required         Psychosocial Discharge (Final Psychosocial Re-Evaluation):      Psychosocial Re-Evaluation - 03/16/17 1459      Psychosocial Re-Evaluation   Current issues with None Identified   Comments Patient continues to work through his grief over his son. Will continue to monitor.    Expected Outcomes Patient will have no psychosocial barriers identified at discharge.    Interventions Encouraged to attend Cardiac Rehabilitation for the exercise;Relaxation education;Stress management education   Continue Psychosocial Services  No Follow up required      Vocational Rehabilitation: Provide vocational rehab assistance to qualifying candidates.   Vocational Rehab Evaluation & Intervention:     Vocational Rehab - 01/10/17 1444      Initial Vocational Rehab Evaluation & Intervention   Assessment shows need for Vocational Rehabilitation No      Education: Education Goals: Education classes will be provided on a weekly basis, covering required topics. Participant will state understanding/return demonstration of topics presented.  Learning Barriers/Preferences:     Learning Barriers/Preferences - 01/10/17 1444      Learning Barriers/Preferences   Learning Barriers Hearing   Learning Preferences Skilled Demonstration;Individual Instruction;Group Instruction      Education Topics: Hypertension, Hypertension Reduction -Define heart disease and high blood pressure. Discus how high blood pressure affects the body and ways to reduce high blood pressure.   Exercise and Your Heart -Discuss why it is important to exercise, the FITT principles of exercise, normal and abnormal responses to exercise, and how to exercise safely.   Angina -Discuss definition of angina, causes of angina, treatment of angina, and how to decrease risk of having angina.   Cardiac Medications -Review what the following cardiac medications are used for, how they affect the body, and side effects that may occur when taking the medications.  Medications include Aspirin, Beta  blockers, calcium channel blockers, ACE Inhibitors, angiotensin receptor blockers, diuretics, digoxin, and antihyperlipidemics.   Congestive Heart Failure -  Discuss the definition of CHF, how to live with CHF, the signs and symptoms of CHF, and how keep track of weight and sodium intake.   Heart Disease and Intimacy -Discus the effect sexual activity has on the heart, how changes occur during intimacy as we age, and safety during sexual activity.   CARDIAC REHAB PHASE II EXERCISE from 03/08/2017 in [place]  Date  01/18/17  Educator  DC  Instruction Review Code  2- Demonstrated Understanding      Smoking Cessation / COPD -Discuss different methods to quit smoking, the health benefits of quitting smoking, and the definition of COPD.   CARDIAC REHAB PHASE II EXERCISE from 03/08/2017 in [place]  Date  01/25/17  Educator  DJ  Instruction Review Code  2- Demonstrated Understanding      Nutrition I: Fats -Discuss the types of cholesterol, what cholesterol does to the heart, and how cholesterol levels can be controlled.   CARDIAC REHAB PHASE II EXERCISE from 03/08/2017 in [place]  Date  02/01/17  Educator  DC  Instruction Review Code  2- Demonstrated Understanding      Nutrition II: Labels -Discuss the different components of food labels and how to read food label   Heart Parts and Heart Disease -Discuss the anatomy of the heart, the pathway of blood circulation through the heart, and these are affected by heart disease.   CARDIAC REHAB PHASE II EXERCISE from 03/08/2017 in [place]  Date  02/15/17  Educator  Dj  Instruction Review Code  2- Demonstrated Understanding      Stress I: Signs and Symptoms -Discuss the causes of stress, how stress may lead to anxiety and depression, and ways to limit stress.   CARDIAC REHAB PHASE II EXERCISE from 03/08/2017 in [place]  Date  02/22/17  Educator  DC  Instruction Review Code  2- Demonstrated Understanding      Stress II: Relaxation -Discuss different types of relaxation techniques to limit stress.   CARDIAC REHAB PHASE II EXERCISE from 03/08/2017 in [place]  Date  03/01/17  Educator  DJ  Instruction Review Code  2- Demonstrated Understanding      Warning Signs of Stroke / TIA -Discuss definition of a stroke, what the signs and symptoms are of a stroke, and how to identify when someone is having stroke.   CARDIAC REHAB PHASE II EXERCISE from 03/08/2017 in [place]  Date  03/08/17  Educator  [place]  Instruction Review Code  2- Demonstrated Understanding      Knowledge Questionnaire Score:     Knowledge Questionnaire Score - 01/10/17 1444      Knowledge Questionnaire Score   Pre Score 21/24      Core Components/Risk Factors/Patient Goals at Admission:     Personal Goals and Risk Factors at Admission - 01/10/17 1449      Core Components/Risk Factors/Patient Goals on Admission    Weight Management Obesity   Personal Goal Other Yes   Personal Goal Gain streanth and stamina   Intervention Attend CR 3 x week and supplement with home exercise 2 x week   Expected Outcomes Reach personal goals.       Core Components/Risk Factors/Patient Goals Review:      Goals and Risk Factor Review    Row Name 01/10/17 1449 02/16/17 0912 03/16/17 1449         Core Components/Risk Factors/Patient Goals Review  Personal Goals Review Weight Management/Obesity;Lipids Weight Management/Obesity  Get stronger; gain stamina.  Weight Management/Obesity  Get stronger; gain stamina.     Review  - Patient has completed 13 sessions gaining 7 lbs. Patient is doing well in the program. He says he feels somewhat stronger since he started. He stopped smoking 5 months ago and remains tobacco free. Will continue to monitor.  Patient has completed 21  sessions. He has not attended in past week due to new onset chest pain. He is also hypertensive. A nuclear stress test was done 03/14/17. Cardiologist increased his Metoprolol dosage. He was okayed to return to CR. Patient has been progressing well in the program stating he does have more energy and stamina and he believes the program is helping him. Will continue to monitor.      Expected Outcomes  - Patient will complete the program meeting his personal goals.  Patient will complete the program and continue to meet his personal goals.         Core Components/Risk Factors/Patient Goals at Discharge (Final Review):      Goals and Risk Factor Review - 03/16/17 1449      Core Components/Risk Factors/Patient Goals Review   Personal Goals Review Weight Management/Obesity  Get stronger; gain stamina.   Review Patient has completed 21 sessions. He has not attended in past week due to new onset chest pain. He is also hypertensive. A nuclear stress test was done 03/14/17. Cardiologist increased his Metoprolol dosage. He was okayed to return to CR. Patient has been progressing well in the program stating he does have more energy and stamina and he believes the program is helping him. Will continue to monitor.    Expected Outcomes Patient will complete the program and continue to meet his personal goals.       ITP Comments:     ITP Comments    Row Name 01/18/17 0742 01/19/17 1314         ITP Comments Patient new to program. Plans to start 01/18/17.  Patient attended Family Matters class with Vance chaplin to discuss how his recent diagnosis has effected his life.          Comments: ITP 30 Day REVIEW Patient is doing well in the program. Will continue to monitor for progress.

## 2017-03-17 ENCOUNTER — Encounter (HOSPITAL_COMMUNITY)
Admission: RE | Admit: 2017-03-17 | Discharge: 2017-03-17 | Disposition: A | Discharge status: Home / Self Care | Payer: Non-veteran care | Source: Ambulatory Visit | Attending: Cardiovascular Disease | Admitting: Cardiovascular Disease

## 2017-03-17 DIAGNOSIS — Z951 Presence of aortocoronary bypass graft: Secondary | ICD-10-CM | POA: Insufficient documentation

## 2017-03-17 NOTE — Progress Notes (Signed)
Daily Session Note  Patient Details  Name: [name] MRN: [id number] Date of Birth: 08-Nov-1945 Referring Provider:     [place] from 01/10/2017 in [place]  Referring Provider  Dr. Harl Bowie      Encounter Date: 03/17/2017  Check In:     Session Check In - 03/17/17 1101      Check-In   Location [facility]   Staff Present Aundra Dubin, RN, BSN;Ivin Rosenbloom Luther Parody, BS, EP, Exercise Physiologist   Supervising physician immediately available to respond to emergencies See telemetry face sheet for immediately available MD   Medication changes reported     No   Fall or balance concerns reported    No   Warm-up and Cool-down Performed as group-led instruction   Resistance Training Performed Yes   VAD Patient? No     Pain Assessment   Currently in Pain? No/denies   Pain Score 0-No pain   Multiple Pain Sites No      Capillary Blood Glucose: No results found for this or any previous visit (from the past 24 hour(s)).    History  Smoking Status  . Former Smoker  . Packs/day: 1.00  . Years: 40.00  . Types: Cigarettes  . Start date: 02/20/1959  . Quit date: 09/13/2016  Smokeless Tobacco  . Never Used    Comment: 10/7 2-8 cigarettes per day. cutting back- AJ    Goals Met:  Independence with exercise equipment Exercise tolerated well No report of cardiac concerns or symptoms Strength training completed today  Goals Unmet:  Not Applicable  Comments: Check out 1200   Dr. Kate Sable is Medical Director for [place] and [facility] Re[facility].

## 2017-03-19 ENCOUNTER — Other Ambulatory Visit: Payer: Self-pay | Admitting: Cardiology

## 2017-03-20 ENCOUNTER — Encounter (HOSPITAL_COMMUNITY)
Admission: RE | Admit: 2017-03-20 | Discharge: 2017-03-20 | Disposition: A | Discharge status: Home / Self Care | Payer: Non-veteran care | Source: Ambulatory Visit | Attending: Cardiology | Admitting: Cardiology

## 2017-03-20 DIAGNOSIS — Z951 Presence of aortocoronary bypass graft: Secondary | ICD-10-CM | POA: Diagnosis not present

## 2017-03-20 NOTE — Progress Notes (Signed)
Daily Session Note  Patient Details  Name: [name] MRN: [id number] Date of Birth: Apr 29, 1946 Referring Provider:     CARDIAC REHAB PHASE II ORIENTATION from 01/10/2017 in [place]  Referring Provider  Dr. Harl Bowie      Encounter Date: 03/20/2017  Check In: Session Check In - 03/20/17 1104      Check-In   Location  [facility]    Staff Present  Suzanne Boron, BS, EP, Exercise Physiologist;Debra Wynetta Emery, RN, BSN    Supervising physician immediately available to respond to emergencies  See telemetry face sheet for immediately available MD    Medication changes reported      No    Fall or balance concerns reported     No    Warm-up and Cool-down  Performed as group-led instruction    Resistance Training Performed  Yes    VAD Patient?  No      Pain Assessment   Currently in Pain?  No/denies    Pain Score  0-No pain    Multiple Pain Sites  No       Capillary Blood Glucose: No results found for this or any previous visit (from the past 24 hour(s)).    Social History   Tobacco Use  Smoking Status Former Smoker  . Packs/day: 1.00  . Years: 40.00  . Pack years: 40.00  . Types: Cigarettes  . Start date: 02/20/1959  . Last attempt to quit: 09/13/2016  . Years since quitting: 0.5  Smokeless Tobacco Never Used  Tobacco Comment   10/7 2-8 cigarettes per day. cutting back- AJ    Goals Met:  Independence with exercise equipment Exercise tolerated well No report of cardiac concerns or symptoms Strength training completed today  Goals Unmet:  Not Applicable  Comments: Check out 1200   Dr. Kate Sable is Medical Director for [place] and [facility] Re[facility].

## 2017-03-22 ENCOUNTER — Encounter (HOSPITAL_COMMUNITY)
Admission: RE | Admit: 2017-03-22 | Discharge: 2017-03-22 | Disposition: A | Discharge status: Home / Self Care | Payer: Non-veteran care | Source: Ambulatory Visit | Attending: Cardiology | Admitting: Cardiology

## 2017-03-22 DIAGNOSIS — Z951 Presence of aortocoronary bypass graft: Secondary | ICD-10-CM | POA: Diagnosis not present

## 2017-03-22 NOTE — Progress Notes (Signed)
Daily Session Note  Patient Details  Name: [name] MRN: [id number] Date of Birth: 07-Mar-1946 Referring Provider:     [place] from 01/10/2017 in [place]  Referring Provider  Dr. Harl Bowie      Encounter Date: 03/22/2017  Check In: Session Check In - 03/22/17 1104      Check-In   Location  [facility]    Staff Present  Suzanne Boron, BS, EP, Exercise Physiologist;Debra Wynetta Emery, RN, BSN    Supervising physician immediately available to respond to emergencies  See telemetry face sheet for immediately available MD    Medication changes reported      No    Fall or balance concerns reported     No    Warm-up and Cool-down  Performed as group-led instruction    Resistance Training Performed  Yes    VAD Patient?  No      Pain Assessment   Currently in Pain?  No/denies    Pain Score  0-No pain    Multiple Pain Sites  No       Capillary Blood Glucose: No results found for this or any previous visit (from the past 24 hour(s)).    Social History   Tobacco Use  Smoking Status Former Smoker  . Packs/day: 1.00  . Years: 40.00  . Pack years: 40.00  . Types: Cigarettes  . Start date: 02/20/1959  . Last attempt to quit: 09/13/2016  . Years since quitting: 0.5  Smokeless Tobacco Never Used  Tobacco Comment   10/7 2-8 cigarettes per day. cutting back- AJ    Goals Met:  Independence with exercise equipment Exercise tolerated well No report of cardiac concerns or symptoms Strength training completed today  Goals Unmet:  Not Applicable  Comments: Check out 1200   Dr. Kate Sable is Medical Director for [place] and [facility] Re[facility].

## 2017-03-24 ENCOUNTER — Encounter (HOSPITAL_COMMUNITY)
Admission: RE | Admit: 2017-03-24 | Discharge: 2017-03-24 | Disposition: A | Discharge status: Home / Self Care | Payer: Non-veteran care | Source: Ambulatory Visit | Attending: Cardiology | Admitting: Cardiology

## 2017-03-24 DIAGNOSIS — Z951 Presence of aortocoronary bypass graft: Secondary | ICD-10-CM | POA: Diagnosis not present

## 2017-03-24 NOTE — Progress Notes (Signed)
Daily Session Note  Patient Details  Name: [name] MRN: [id number] Date of Birth: 05/16/1946 Referring Provider:     CARDIAC REHAB PHASE II ORIENTATION from 01/10/2017 in [place]  Referring Provider  Dr. Harl Bowie      Encounter Date: 03/24/2017  Check In: Session Check In - 03/24/17 1102      Check-In   Location  [facility]    Staff Present  Suzanne Boron, BS, EP, Exercise Physiologist;Debra Wynetta Emery, RN, BSN    Supervising physician immediately available to respond to emergencies  See telemetry face sheet for immediately available MD    Medication changes reported      No    Fall or balance concerns reported     No    Warm-up and Cool-down  Performed as group-led instruction    Resistance Training Performed  Yes    VAD Patient?  No      Pain Assessment   Currently in Pain?  No/denies    Pain Score  0-No pain    Multiple Pain Sites  No       Capillary Blood Glucose: No results found for this or any previous visit (from the past 24 hour(s)).    Social History   Tobacco Use  Smoking Status Former Smoker  . Packs/day: 1.00  . Years: 40.00  . Pack years: 40.00  . Types: Cigarettes  . Start date: 02/20/1959  . Last attempt to quit: 09/13/2016  . Years since quitting: 0.5  Smokeless Tobacco Never Used  Tobacco Comment   10/7 2-8 cigarettes per day. cutting back- AJ    Goals Met:  Independence with exercise equipment Exercise tolerated well No report of cardiac concerns or symptoms Strength training completed today  Goals Unmet:  Not Applicable  Comments: Check out 1200   Dr. Kate Sable is Medical Director for [place] and [facility]monary Re[facility].

## 2017-03-27 ENCOUNTER — Encounter (HOSPITAL_COMMUNITY)
Admission: RE | Admit: 2017-03-27 | Discharge: 2017-03-27 | Disposition: A | Discharge status: Home / Self Care | Payer: Non-veteran care | Source: Ambulatory Visit | Attending: Cardiology | Admitting: Cardiology

## 2017-03-27 DIAGNOSIS — Z951 Presence of aortocoronary bypass graft: Secondary | ICD-10-CM

## 2017-03-27 NOTE — Progress Notes (Signed)
Daily Session Note  Patient Details  Name: [name] MRN: [id number] Date of Birth: 1945/11/15 Referring Provider:     CARDIAC REHAB PHASE II ORIENTATION from 01/10/2017 in [place]  Referring Provider  Dr. Harl Bowie      Encounter Date: 03/27/2017  Check In: Session Check In - 03/27/17 1106      Check-In   Location  [facility]    Staff Present  Suzanne Boron, BS, EP, Exercise Physiologist;Debra Wynetta Emery, RN, BSN    Supervising physician immediately available to respond to emergencies  See telemetry face sheet for immediately available MD    Medication changes reported      No    Fall or balance concerns reported     No    Warm-up and Cool-down  Performed as group-led instruction    Resistance Training Performed  Yes    VAD Patient?  No      Pain Assessment   Currently in Pain?  No/denies    Pain Score  0-No pain    Multiple Pain Sites  No       Capillary Blood Glucose: No results found for this or any previous visit (from the past 24 hour(s)).    Social History   Tobacco Use  Smoking Status Former Smoker  . Packs/day: 1.00  . Years: 40.00  . Pack years: 40.00  . Types: Cigarettes  . Start date: 02/20/1959  . Last attempt to quit: 09/13/2016  . Years since quitting: 0.5  Smokeless Tobacco Never Used  Tobacco Comment   10/7 2-8 cigarettes per day. cutting back- AJ    Goals Met:  Independence with exercise equipment Exercise tolerated well No report of cardiac concerns or symptoms Strength training completed today  Goals Unmet:  Not Applicable  Comments: Check out 1200   Dr. Kate Sable is Medical Director for [place] and [facility].

## 2017-03-29 ENCOUNTER — Encounter (HOSPITAL_COMMUNITY)
Admission: RE | Admit: 2017-03-29 | Discharge: 2017-03-29 | Disposition: A | Discharge status: Home / Self Care | Payer: Non-veteran care | Source: Ambulatory Visit | Attending: Cardiology | Admitting: Cardiology

## 2017-03-29 DIAGNOSIS — Z951 Presence of aortocoronary bypass graft: Secondary | ICD-10-CM | POA: Diagnosis not present

## 2017-03-29 NOTE — Progress Notes (Signed)
Daily Session Note  Patient Details  Name: [name] MRN: [id number] Date of Birth: 09/03/1945 Referring Provider:     CARDIAC REHAB PHASE II ORIENTATION from 01/10/2017 in [facility]  Referring Provider  Dr. Branch      Encounter Date: 03/29/2017  Check In: Session Check In - 03/29/17 1112      Check-In   Location  [facility]    Staff Present  Elany Felix, BS, EP, Exercise Physiologist;Debra Johnson, RN, BSN    Supervising physician immediately available to respond to emergencies  See telemetry face sheet for immediately available MD    Medication changes reported      No    Fall or balance concerns reported     No    Warm-up and Cool-down  Performed as group-led instruction    Resistance Training Performed  Yes    VAD Patient?  No      Pain Assessment   Currently in Pain?  No/denies    Pain Score  0-No pain    Multiple Pain Sites  No       Capillary Blood Glucose: No results found for this or any previous visit (from the past 24 hour(s)).    Social History   Tobacco Use  Smoking Status Former Smoker  . Packs/day: 1.00  . Years: 40.00  . Pack years: 40.00  . Types: Cigarettes  . Start date: 02/20/1959  . Last attempt to quit: 09/13/2016  . Years since quitting: 0.5  Smokeless Tobacco Never Used  Tobacco Comment   10/7 2-8 cigarettes per day. cutting back- AJ    Goals Met:  Independence with exercise equipment Exercise tolerated well No report of cardiac concerns or symptoms Strength training completed today  Goals Unmet:  Not Applicable  Comments: Check out 1200   Dr. Suresh Koneswaran is Medical Director for [facility]. 

## 2017-03-31 ENCOUNTER — Encounter (HOSPITAL_COMMUNITY)
Admission: RE | Admit: 2017-03-31 | Discharge: 2017-03-31 | Disposition: A | Discharge status: Home / Self Care | Payer: Non-veteran care | Source: Ambulatory Visit | Attending: Cardiology | Admitting: Cardiology

## 2017-03-31 DIAGNOSIS — Z951 Presence of aortocoronary bypass graft: Secondary | ICD-10-CM

## 2017-03-31 NOTE — Progress Notes (Signed)
Daily Session Note  Patient Details  Name: [name] MRN: [id number] Date of Birth: 10/10/1945 Referring Provider:     CARDIAC REHAB PHASE II ORIENTATION from 01/10/2017 in [place]  Referring Provider  Dr. Harl Bowie      Encounter Date: 03/31/2017  Check In: Session Check In - 03/31/17 1102      Check-In   Location  [facility]    Staff Present  Suzanne Boron, BS, EP, Exercise Physiologist;Debra Wynetta Emery, RN, BSN    Supervising physician immediately available to respond to emergencies  See telemetry face sheet for immediately available MD    Medication changes reported      No    Fall or balance concerns reported     No    Warm-up and Cool-down  Performed as group-led instruction    Resistance Training Performed  Yes    VAD Patient?  No      Pain Assessment   Currently in Pain?  No/denies    Pain Score  0-No pain    Multiple Pain Sites  No       Capillary Blood Glucose: No results found for this or any previous visit (from the past 24 hour(s)).    Social History   Tobacco Use  Smoking Status Former Smoker  . Packs/day: 1.00  . Years: 40.00  . Pack years: 40.00  . Types: Cigarettes  . Start date: 02/20/1959  . Last attempt to quit: 09/13/2016  . Years since quitting: 0.5  Smokeless Tobacco Never Used  Tobacco Comment   10/7 2-8 cigarettes per day. cutting back- AJ    Goals Met:  Independence with exercise equipment Exercise tolerated well No report of cardiac concerns or symptoms Strength training completed today  Goals Unmet:  Not Applicable  Comments: Check out 1200   Dr. Kate Sable is Medical Director for [place] and [facility] Re[facility].

## 2017-04-03 ENCOUNTER — Encounter (HOSPITAL_COMMUNITY)
Admission: RE | Admit: 2017-04-03 | Discharge: 2017-04-03 | Disposition: A | Discharge status: Home / Self Care | Payer: Non-veteran care | Source: Ambulatory Visit | Attending: Cardiology | Admitting: Cardiology

## 2017-04-03 DIAGNOSIS — Z951 Presence of aortocoronary bypass graft: Secondary | ICD-10-CM

## 2017-04-03 NOTE — Progress Notes (Signed)
Daily Session Note  Patient Details  Name: [name] MRN: [id number] Date of Birth: 09/03/1945 Referring Provider:     [facility] from 01/10/2017 in [place]  Referring Provider  Dr. Harl Bowie      Encounter Date: 04/03/2017  Check In: Session Check In - 04/03/17 1055      Check-In   Location  [facility]    Staff Present  Suzanne Boron, BS, EP, Exercise Physiologist;Debra Wynetta Emery, RN, BSN    Supervising physician immediately available to respond to emergencies  See telemetry face sheet for immediately available MD    Medication changes reported      No    Fall or balance concerns reported     No    Warm-up and Cool-down  Performed as group-led instruction    Resistance Training Performed  Yes    VAD Patient?  No      Pain Assessment   Currently in Pain?  No/denies    Pain Score  0-No pain    Multiple Pain Sites  No       Capillary Blood Glucose: No results found for this or any previous visit (from the past 24 hour(s)).    Social History   Tobacco Use  Smoking Status Former Smoker  . Packs/day: 1.00  . Years: 40.00  . Pack years: 40.00  . Types: Cigarettes  . Start date: 02/20/1959  . Last attempt to quit: 09/13/2016  . Years since quitting: 0.5  Smokeless Tobacco Never Used  Tobacco Comment   10/7 2-8 cigarettes per day. cutting back- AJ    Goals Met:  Independence with exercise equipment Exercise tolerated well No report of cardiac concerns or symptoms Strength training completed today  Goals Unmet:  Not Applicable  Comments: Check out 1200   Dr. Kate Sable is Medical Director for [place] and Pulmonary Re[facility].

## 2017-04-05 ENCOUNTER — Encounter (HOSPITAL_COMMUNITY)
Admission: RE | Admit: 2017-04-05 | Discharge: 2017-04-05 | Disposition: A | Discharge status: Home / Self Care | Payer: Non-veteran care | Source: Ambulatory Visit | Attending: Cardiology | Admitting: Cardiology

## 2017-04-05 DIAGNOSIS — Z951 Presence of aortocoronary bypass graft: Secondary | ICD-10-CM

## 2017-04-05 NOTE — Progress Notes (Signed)
Daily Session Note  Patient Details  Name: [name] MRN: [id number] Date of Birth: 1945-08-19 Referring Provider:     CARDIAC REHAB PHASE II ORIENTATION from 01/10/2017 in [place]  Referring Provider  Dr. Harl Bowie      Encounter Date: 04/05/2017  Check In: Session Check In - 04/05/17 1109      Check-In   Location  [facility]    Staff Present  Suzanne Boron, BS, EP, Exercise Physiologist;Diane Coad, MS, EP, [facility], Exercise Physiologist;Debra Wynetta Emery, RN, BSN    Supervising physician immediately available to respond to emergencies  See telemetry face sheet for immediately available MD    Medication changes reported      No    Fall or balance concerns reported     No    Warm-up and Cool-down  Performed as group-led instruction    Resistance Training Performed  Yes    VAD Patient?  No      Pain Assessment   Currently in Pain?  No/denies    Pain Score  0-No pain    Multiple Pain Sites  No       Capillary Blood Glucose: No results found for this or any previous visit (from the past 24 hour(s)).    Social History   Tobacco Use  Smoking Status Former Smoker  . Packs/day: 1.00  . Years: 40.00  . Pack years: 40.00  . Types: Cigarettes  . Start date: 02/20/1959  . Last attempt to quit: 09/13/2016  . Years since quitting: 0.5  Smokeless Tobacco Never Used  Tobacco Comment   10/7 2-8 cigarettes per day. cutting back- AJ    Goals Met:  Independence with exercise equipment Exercise tolerated well No report of cardiac concerns or symptoms Strength training completed today  Goals Unmet:  Not Applicable  Comments: Check out 1200   Dr. Kate Sable is Medical Director for [place] and [facility] Re[facility].

## 2017-04-07 ENCOUNTER — Encounter (HOSPITAL_COMMUNITY): Payer: Non-veteran care

## 2017-04-10 ENCOUNTER — Encounter (HOSPITAL_COMMUNITY)
Admission: RE | Admit: 2017-04-10 | Discharge: 2017-04-10 | Disposition: A | Discharge status: Home / Self Care | Payer: Non-veteran care | Source: Ambulatory Visit | Attending: Cardiology | Admitting: Cardiology

## 2017-04-10 DIAGNOSIS — Z951 Presence of aortocoronary bypass graft: Secondary | ICD-10-CM | POA: Diagnosis not present

## 2017-04-10 NOTE — Progress Notes (Signed)
Daily Session Note  Patient Details  Name: [name] MRN: [id number] Date of Birth: [birth year] Referring Provider:     CARDIAC REHAB PHASE II ORIENTATION from 01/10/2017 in [place]  Referring Provider  Dr. Harl Bowie      Encounter Date: 04/10/2017  Check In: Session Check In - 04/10/17 1124      Check-In   Location  [facility]    Staff Present  Suzanne Boron, BS, EP, Exercise Physiologist;Debra Wynetta Emery, RN, BSN;Diane Coad, MS, EP, [facility], Exercise Physiologist    Supervising physician immediately available to respond to emergencies  See telemetry face sheet for immediately available MD    Medication changes reported      No    Fall or balance concerns reported     No    Warm-up and Cool-down  Performed as group-led instruction    Resistance Training Performed  Yes    VAD Patient?  No      Pain Assessment   Currently in Pain?  No/denies    Pain Score  0-No pain    Multiple Pain Sites  No       Capillary Blood Glucose: No results found for this or any previous visit (from the past 24 hour(s)).    Social History   Tobacco Use  Smoking Status Former Smoker  . Packs/day: 1.00  . Years: 40.00  . Pack years: 40.00  . Types: Cigarettes  . Start date: 02/20/1959  . Last attempt to quit: 09/13/2016  . Years since quitting: 0.5  Smokeless Tobacco Never Used  Tobacco Comment   10/7 2-8 cigarettes per day. cutting back- AJ    Goals Met:  Independence with exercise equipment Exercise tolerated well No report of cardiac concerns or symptoms Strength training completed today  Goals Unmet:  Not Applicable  Comments: Check out 1200   Dr. Kate Sable is Medical Director for [place] and Pulmonary Rehab.

## 2017-04-12 ENCOUNTER — Encounter (HOSPITAL_COMMUNITY)
Admission: RE | Admit: 2017-04-12 | Discharge: 2017-04-12 | Disposition: A | Discharge status: Home / Self Care | Payer: Non-veteran care | Source: Ambulatory Visit | Attending: Cardiology | Admitting: Cardiology

## 2017-04-12 DIAGNOSIS — Z951 Presence of aortocoronary bypass graft: Secondary | ICD-10-CM | POA: Diagnosis not present

## 2017-04-12 NOTE — Progress Notes (Signed)
Daily Session Note  Patient Details  Name: LIRON EISSLER MRN: [id number] Date of Birth: 04/07/1946 Referring Provider:     CARDIAC REHAB PHASE II ORIENTATION from 01/10/2017 in [place]  Referring Provider  Dr. Harl Bowie      Encounter Date: 04/12/2017  Check In: Session Check In - 04/12/17 1119      Check-In   Location  [facility]    Staff Present  Suzanne Boron, BS, EP, Exercise Physiologist;Debra Wynetta Emery, RN, BSN    Supervising physician immediately available to respond to emergencies  See telemetry face sheet for immediately available MD    Medication changes reported      No    Fall or balance concerns reported     No    Warm-up and Cool-down  Performed as group-led instruction    Resistance Training Performed  Yes    VAD Patient?  No      Pain Assessment   Currently in Pain?  No/denies    Pain Score  0-No pain    Multiple Pain Sites  No       Capillary Blood Glucose: No results found for this or any previous visit (from the past 24 hour(s)).    Social History   Tobacco Use  Smoking Status Former Smoker  . Packs/day: 1.00  . Years: 40.00  . Pack years: 40.00  . Types: Cigarettes  . Start date: 02/20/1959  . Last attempt to quit: 09/13/2016  . Years since quitting: 0.5  Smokeless Tobacco Never Used  Tobacco Comment   10/7 2-8 cigarettes per day. cutting back- AJ    Goals Met:  Independence with exercise equipment Exercise tolerated well No report of cardiac concerns or symptoms Strength training completed today  Goals Unmet:  Not Applicable  Comments: Check out 1200   Dr. Kate Sable is Medical Director for [facility].

## 2017-04-14 ENCOUNTER — Encounter (HOSPITAL_COMMUNITY)
Admission: RE | Admit: 2017-04-14 | Discharge: 2017-04-14 | Disposition: A | Discharge status: Home / Self Care | Payer: Non-veteran care | Source: Ambulatory Visit | Attending: Cardiology | Admitting: Cardiology

## 2017-04-14 DIAGNOSIS — Z951 Presence of aortocoronary bypass graft: Secondary | ICD-10-CM

## 2017-04-14 NOTE — Progress Notes (Signed)
Cardiac Individual Treatment Plan  Patient Details  Name: [name] MRN: [id number] Date of Birth: 1946-01-20 Referring Provider:     [facility] from 01/10/2017 in [place]  Referring Provider  Dr. Harl Bowie      Initial Encounter Date:    CARDIAC REHAB PHASE II ORIENTATION from 01/10/2017 in [place]  Date  01/10/17  Referring Provider  Dr. Harl Bowie      Visit Diagnosis: S/P CABG x 2  Patient's Home Medications on Admission:  Current Outpatient Medications:  .  acetaminophen (TYLENOL) 325 MG tablet, Take 2 tablets (650 mg total) by mouth every 6 (six) hours as needed for mild pain., Disp: , Rfl:  .  amLODipine (NORVASC) 10 MG tablet, Take 1 tablet (10 mg total) by mouth daily., Disp: , Rfl:  .  aspirin 81 MG chewable tablet, Chew 1 tablet (81 mg total) by mouth daily., Disp: , Rfl:  .  atorvastatin (LIPITOR) 40 MG tablet, Take 1 tablet (40 mg total) by mouth daily., Disp: 30 tablet, Rfl: 1 .  clopidogrel (PLAVIX) 75 MG tablet, Take 1 tablet (75 mg total) by mouth daily., Disp: 30 tablet, Rfl: 1 .  ergocalciferol (VITAMIN D2) 50000 UNITS capsule, Take 50,000 Units by mouth every Sunday. , Disp: , Rfl:  .  furosemide (LASIX) 40 MG tablet, Take 40 mg by mouth 2 (two) times daily. WEIGHT >220LBS TAKE 60 AM AND 40 PM UNTIL WEIGHT COMES DOWN THEN RESUME 40 MG BID, Disp: , Rfl:  .  levothyroxine (SYNTHROID, LEVOTHROID) 25 MCG tablet, Take 25 mcg by mouth daily before breakfast., Disp: , Rfl:  .  lisinopril (PRINIVIL,ZESTRIL) 10 MG tablet, Take 10 mg by mouth daily., Disp: , Rfl:  .  lisinopril (PRINIVIL,ZESTRIL) 10 MG tablet, TAKE 1 TABLET BY MOUTH EVERY DAY, Disp: 30 tablet, Rfl: 6 .  magnesium oxide (MAG-OX) 400 MG tablet, Take 400 mg by mouth daily., Disp: , Rfl:  .  metoprolol succinate (TOPROL-XL) 25 MG 24 hr tablet, Take 1.5 tablets (37.5 mg total) by mouth daily., Disp: 135 tablet, Rfl: 3 .  Omega-3 Fatty Acids  (RA FISH OIL) 1000 MG CAPS, Take 2,000 mg by mouth 2 (two) times daily. , Disp: , Rfl:  .  omeprazole (PRILOSEC) 20 MG capsule, Take 40 mg by mouth daily. , Disp: , Rfl:  .  potassium chloride SA (K-DUR,KLOR-CON) 20 MEQ tablet, Take 20 mEq by mouth daily., Disp: , Rfl:  .  PRESCRIPTION MEDICATION, 1 each. Cortisone Injection into each knee every 4 months., Disp: , Rfl:   Past Medical History: Past Medical History:  Diagnosis Date  . Acute myocardial infarction of other inferior wall, initial episode of care   . Anginal pain ([place])   . Anxiety   . Arthritis   . Cancer [facility])    bladder 2013 removed, has cystoscopy 10/2016, has returned x 2  . COPD (chronic obstructive pulmonary disease) ([place])   . Coronary artery disease    Artery bypass graft Jan 2008  . Dyspnea   . Dysrhythmia   . GERD (gastroesophageal reflux disease)   . Heart murmur   . History of hiatal hernia   . Hyperlipidemia   . Hypertension   . Hypothyroidism   . PONV (postoperative nausea and vomiting)   . Sleep apnea   . Stroke ([place])   . Tobacco user     Tobacco Use: Social History   Tobacco Use  Smoking Status Former Smoker  .  Packs/day: 1.00  . Years: 40.00  . Pack years: 40.00  . Types: Cigarettes  . Start date: 02/20/1959  . Last attempt to quit: 09/13/2016  . Years since quitting: 0.5  Smokeless Tobacco Never Used  Tobacco Comment   10/7 2-8 cigarettes per day. cutting back- AJ    Labs: Recent Review Flowsheet Data    Labs for ITP Cardiac and Pulmonary Rehab Latest Ref Rng & Units 09/19/2016 09/19/2016 09/19/2016 09/19/2016 09/20/2016   Cholestrol 0 - 200 mg/dL - - - - -   LDLCALC 0 - 99 mg/dL - - - - -   LDLDIRECT mg/dL - - - - -   HDL >40 mg/dL - - - - -   Trlycerides <150 mg/dL - - - - -   Hemoglobin A1c 4.8 - 5.6 % - - - - -   PHART 7.350 - 7.450 7.360 - 7.333(L) 7.364 -   PCO2ART 32.0 - 48.0 mmHg 45.1 - 45.6 41.8 -   HCO3 20.0 - 28.0 mmol/L 25.7 - 24.4 23.9 -   TCO2 0 - 100 mmol/L 27 25 26 25 26     ACIDBASEDEF 0.0 - 2.0 mmol/L - - 2.0 1.0 -   O2SAT % 98.0 - 94.0 95.0 -      Capillary Blood Glucose: Lab Results  Component Value Date   GLUCAP 103 (H) 09/23/2016   GLUCAP 99 09/22/2016   GLUCAP 108 (H) 09/22/2016   GLUCAP 102 (H) 09/22/2016   GLUCAP 92 09/22/2016     Exercise Target Goals:    Exercise Program Goal: Individual exercise prescription set with THRR, safety & activity barriers. Participant demonstrates ability to understand and report RPE using BORG scale, to self-measure pulse accurately, and to acknowledge the importance of the exercise prescription.  Exercise Prescription Goal: Starting with aerobic activity 30 plus minutes a day, 3 days per week for initial exercise prescription. Provide home exercise prescription and guidelines that participant acknowledges understanding prior to discharge.  Activity Barriers & Risk Stratification: Activity Barriers & Cardiac Risk Stratification - 01/10/17 1503      Activity Barriers & Cardiac Risk Stratification   Activity Barriers  -- has knee pain with exertion    Cardiac Risk Stratification  High       6 Minute Walk: 6 Minute Walk    Row Name 01/10/17 1403         6 Minute Walk   Phase  Initial     Distance  1150 feet     Distance % Change  0 %     Distance Feet Change  0 ft     Walk Time  6 minutes     # of Rest Breaks  0     MPH  2.17     METS  2.66     RPE  12     Perceived Dyspnea   12     VO2 Peak  8.33     Symptoms  No     Resting HR  62 bpm     Resting BP  122/52     Resting Oxygen Saturation   97 %     Exercise Oxygen Saturation  during 6 min walk  92 %     Max Ex. HR  90 bpm     Max Ex. BP  138/64     2 Minute Post BP  126/58        Oxygen Initial Assessment:   Oxygen Re-Evaluation:   Oxygen Discharge (Final  Oxygen Re-Evaluation):   Initial Exercise Prescription: Initial Exercise Prescription - 01/10/17 1400      Date of Initial Exercise RX and Referring Provider   Date   01/10/17    Referring Provider  Dr. Harl Bowie      Treadmill   MPH  1.5    Grade  0    Minutes  15    METs  2.2      NuStep   Level  2    SPM  76    Minutes  20    METs  2      Prescription Details   Frequency (times per week)  3    Duration  Progress to 30 minutes of continuous aerobic without signs/symptoms of physical distress      Intensity   THRR 40-80% of Max Heartrate  [phone]    Ratings of Perceived Exertion  11-13    Perceived Dyspnea  0-4      Progression   Progression  Continue progressive overload as per policy without signs/symptoms or physical distress.      Resistance Training   Training Prescription  Yes    Weight  1    Reps  10-15       Perform Capillary Blood Glucose checks as needed.  Exercise Prescription Changes:  Exercise Prescription Changes    Row Name 01/18/17 1200 01/31/17 0700 02/13/17 1400 02/27/17 1400 03/10/17 1400     Response to Exercise   Blood Pressure (Admit)  -  144/50  140/56  146/52  146/50   Blood Pressure (Exercise)  -  166/60  166/60  168/60  160/60   Blood Pressure (Exit)  -  140/52  146/52  136/50  142/56   Heart Rate (Admit)  -  54 bpm  56 bpm  54 bpm  56 bpm   Heart Rate (Exercise)  -  67 bpm  76 bpm  82 bpm  66 bpm   Heart Rate (Exit)  -  63 bpm  64 bpm  64 bpm  58 bpm   Rating of Perceived Exertion (Exercise)  -  11  11  11  11    Duration  -  Progress to 30 minutes of  aerobic without signs/symptoms of physical distress  Progress to 30 minutes of  aerobic without signs/symptoms of physical distress  Progress to 30 minutes of  aerobic without signs/symptoms of physical distress  Progress to 30 minutes of  aerobic without signs/symptoms of physical distress   Intensity  -  THRR New [phone]  THRR New [phone]  THRR New 92-112-131  THRR New [phone]     Progression   Progression  -  Continue to progress workloads to maintain intensity without signs/symptoms of physical distress.  Continue to progress workloads to  maintain intensity without signs/symptoms of physical distress.  Continue to progress workloads to maintain intensity without signs/symptoms of physical distress.  Continue to progress workloads to maintain intensity without signs/symptoms of physical distress.     Resistance Training   Training Prescription  -  Yes  Yes  Yes  Yes   Weight  -  1  2  3  4    Reps  -  10-15  10-15  10-15  10-15     NuStep   Level  -  3  3  3  3    SPM  -  90  90  98  96   Minutes  -  15  15  15   15  METs  -  2.4  2.3  2.5  2.5     Arm Ergometer   Level  -  2.2  2.2  2.3  2.5   Watts  -  27  23  28  28    Minutes  -  20  20  20  2    METs  -  3  3  3   3.2     Home Exercise Plan   Plans to continue exercise at  Home (comment)  Home (comment)  Home (comment)  Home (comment)  Home (comment)   Frequency  Add 2 additional days to program exercise sessions.  Add 2 additional days to program exercise sessions.  Add 2 additional days to program exercise sessions.  Add 2 additional days to program exercise sessions.  Add 2 additional days to program exercise sessions.   Initial Home Exercises Provided  01/18/17  01/18/17  01/18/17  01/18/17  01/18/17   Row Name 03/29/17 1400 04/11/17 0800           Response to Exercise   Blood Pressure (Admit)  136/56  160/60      Blood Pressure (Exercise)  158/64  168/64      Blood Pressure (Exit)  132/60  144/58      Heart Rate (Admit)  47 bpm  51 bpm      Heart Rate (Exercise)  92 bpm  75 bpm      Heart Rate (Exit)  56 bpm  61 bpm      Rating of Perceived Exertion (Exercise)  11  11      Duration  Progress to 30 minutes of  aerobic without signs/symptoms of physical distress  Progress to 30 minutes of  aerobic without signs/symptoms of physical distress      Intensity  THRR New [phone]  THRR New [phone]        Progression   Progression  Continue to progress workloads to maintain intensity without signs/symptoms of physical distress.  Continue to progress workloads  to maintain intensity without signs/symptoms of physical distress.        Resistance Training   Training Prescription  Yes  Yes      Weight  4  4      Reps  10-15  10-15        NuStep   Level  3  3      SPM  96  94      Minutes  15  15      METs  2.6  2.6        Arm Ergometer   Level  2.7  2.7      Watts  34  33      Minutes  2  2      METs  3.6  3.5        Home Exercise Plan   Plans to continue exercise at  Home (comment)  Home (comment)      Frequency  Add 2 additional days to program exercise sessions.  Add 2 additional days to program exercise sessions.      Initial Home Exercises Provided  01/18/17  01/18/17         Exercise Comments:  Exercise Comments    Row Name 01/18/17 1237 01/31/17 0750 02/13/17 1438 02/27/17 1456 03/10/17 1419   Exercise Comments  Patient received his take home exercise plan today. He spoke of doing activities around the house. I educated him on THR and safe  ways to be active outside of CR. He demonstrated an understanding. I encouraged him to come to me if he had any questions.  Patient is doing well in CR. He was taken off of the treadmill due to knee and joint pain.   Patient is doing well in CR. He is maintaining his levels and his watts on his machines. He has increased his resistance on his dumbbells for his warm up as well. He has been staying active with yard work and [employer] houses  Patient is doing well in CR.  Patient is doing well in CR. He has increased his level on th earm ergometer and has maintained his SPMs on both machines. He states that he feels that the program is helping him.   Row Name 04/11/17 [phone]           Exercise Comments  Patient is doing well in CR. He is maintaining all of his levels and his watts on both of his machines. He has just completed a walk test and is preparing to leave CR.           Exercise Goals and Review:  Exercise Goals    Row Name 01/10/17 1446             Exercise Goals   Increase  Physical Activity  Yes       Intervention  Provide advice, education, support and counseling about physical activity/exercise needs.;Develop an individualized exercise prescription for aerobic and resistive training based on initial evaluation findings, risk stratification, comorbidities and participant's personal goals.       Expected Outcomes  Achievement of increased cardiorespiratory fitness and enhanced flexibility, muscular endurance and strength shown through measurements of functional capacity and personal statement of participant.       Increase Strength and Stamina  Yes       Intervention  Provide advice, education, support and counseling about physical activity/exercise needs.;Develop an individualized exercise prescription for aerobic and resistive training based on initial evaluation findings, risk stratification, comorbidities and participant's personal goals.       Expected Outcomes  Achievement of increased cardiorespiratory fitness and enhanced flexibility, muscular endurance and strength shown through measurements of functional capacity and personal statement of participant.       Able to understand and use rate of perceived exertion (RPE) scale  Yes       Intervention  Provide education and explanation on how to use RPE scale       Expected Outcomes  Short Term: Able to use RPE daily in rehab to express subjective intensity level;Long Term:  Able to use RPE to guide intensity level when exercising independently       Able to understand and use Dyspnea scale  Yes       Intervention  Provide education and explanation on how to use Dyspnea scale       Expected Outcomes  Short Term: Able to use Dyspnea scale daily in rehab to express subjective sense of shortness of breath during exertion;Long Term: Able to use Dyspnea scale to guide intensity level when exercising independently       Knowledge and understanding of Target Heart Rate Range (THRR)  Yes       Intervention  Provide education  and explanation of THRR including how the numbers were predicted and where they are located for reference       Expected Outcomes  Short Term: Able to use daily as guideline for intensity in rehab;Long Term: Able to  use THRR to govern intensity when exercising independently       Able to check pulse independently  Yes       Intervention  Review the importance of being able to check your own pulse for safety during independent exercise       Expected Outcomes  Short Term: Able to explain why pulse checking is important during independent exercise;Long Term: Able to check pulse independently and accurately       Understanding of Exercise Prescription  Yes       Intervention  Provide education, explanation, and written materials on patient's individual exercise prescription       Expected Outcomes  Short Term: Able to explain program exercise prescription;Long Term: Able to explain home exercise prescription to exercise independently          Exercise Goals Re-Evaluation : Exercise Goals Re-Evaluation    Row Name 01/31/17 0749 02/13/17 1436 03/10/17 1418 04/11/17 0822       Exercise Goal Re-Evaluation   Exercise Goals Review  Increase Physical Activity;Increase Strength and Stamina;Knowledge and understanding of Target Heart Rate Range (THRR)  Increase Physical Activity;Increase Strength and Stamina;Knowledge and understanding of Target Heart Rate Range (THRR)  Increase Strength and Stamina;Increase Physical Activity;Knowledge and understanding of Target Heart Rate Range (THRR)  Increase Physical Activity;Increase Strength and Stamina;Knowledge and understanding of Target Heart Rate Range (THRR)    Comments  Patient is doing well in CR. He was taken off of the treadmill due to knee and joint pain.   Patient is doing well in CR. He is maintaining his levels and his watts on his machines. He has increased his resistance on his dumbbells for his warm up as well. He has been staying active with yard work  and [employer] houses.  Patient is doing well in CR. He has increased his level on th earm ergometer and has maintained his SPMs on both machines. He states that he feels that the program is helping him.   Patient is doing well in CR. He is maintaining all of his levels and his watts on both of his machines. He has just completed a walk test and is preparing to leave CR.      Expected Outcomes  Patient wishes to gain strength and stamina throughout the program.   Patient wants to get stronger and to gain stamina   Patient wants to get stronger and to gain more stamina   Patient wishes to get stronger and to gain stamina,         Discharge Exercise Prescription (Final Exercise Prescription Changes): Exercise Prescription Changes - 04/11/17 0800      Response to Exercise   Blood Pressure (Admit)  160/60    Blood Pressure (Exercise)  168/64    Blood Pressure (Exit)  144/58    Heart Rate (Admit)  51 bpm    Heart Rate (Exercise)  75 bpm    Heart Rate (Exit)  61 bpm    Rating of Perceived Exertion (Exercise)  11    Duration  Progress to 30 minutes of  aerobic without signs/symptoms of physical distress    Intensity  THRR New 91-110-130      Progression   Progression  Continue to progress workloads to maintain intensity without signs/symptoms of physical distress.      Resistance Training   Training Prescription  Yes    Weight  4    Reps  10-15      NuStep   Level  3    SPM  94    Minutes  15    METs  2.6      Arm Ergometer   Level  2.7    Watts  33    Minutes  2    METs  3.5      Home Exercise Plan   Plans to continue exercise at  Home (comment)    Frequency  Add 2 additional days to program exercise sessions.    Initial Home Exercises Provided  01/18/17       Nutrition:  Target Goals: Understanding of nutrition guidelines, daily intake of sodium 1500mg , cholesterol 200mg , calories 30% from fat and 7% or less from saturated fats, daily to have 5 or more servings of  fruits and vegetables.  Biometrics: Pre Biometrics - 01/10/17 1253      Pre Biometrics   Height  5\' 9"  (1.753 m)    Waist Circumference  39 inches    Hip Circumference  40 inches    Waist to Hip Ratio  0.98 %    Triceps Skinfold  13 mm    % Body Fat  27.8 %    Grip Strength  71.2 kg    Flexibility  0 in    Single Leg Stand  4 seconds        Nutrition Therapy Plan and Nutrition Goals: Nutrition Therapy & Goals - 01/19/17 1314      Personal Nutrition Goals   Nutrition Goal  For heart healthy choices add >50% of whole grains, make half their plate fruits and vegetables. Discuss the difference between starchy vegetables and leafy greens, and how leafy vegetables provide fiber, helps maintain healthy weight, helps control blood glucose, and lowers cholesterol.  Discuss purchasing fresh or frozen vegetable to reduce sodium and not to add grease, fat or sugar. Consume <18oz of red meat per week. Consume lean cuts of meats and very little of meats high in sodium and nitrates such as pork and lunch meats. Discussed portion control for all food groups.      Additional Goals?  No      Intervention Plan   Intervention  Prescribe, educate and counsel regarding individualized specific dietary modifications aiming towards targeted core components such as weight, hypertension, lipid management, diabetes, heart failure and other comorbidities.;Nutrition handout(s) given to patient.    Expected Outcomes  Short Term Goal: Understand basic principles of dietary content, such as calories, fat, sodium, cholesterol and nutrients.;Short Term Goal: A plan has been developed with personal nutrition goals set during dietitian appointment.;Long Term Goal: Adherence to prescribed nutrition plan.       Nutrition Discharge: Rate Your Plate Scores: Nutrition Assessments - 01/10/17 1448      MEDFICTS Scores   Pre Score  27       Nutrition Goals Re-Evaluation: Nutrition Goals Re-Evaluation    Row Name  02/16/17 0907 03/16/17 1446 04/14/17 1304         Goals   Current Weight  220 lb (99.8 kg)  224 lb 8 oz (101.8 kg)  228 lb 9.6 oz (103.7 kg)     Nutrition Goal  Plans to explore mediterranean diet prescribed by his MD. For heart healthy choices add >50% of whole grains, make half their plate fruits and vegetables. Discuss the difference between starchy vegetables and leafy greens, and how leafy vegetables provide fiber, helps maintain healthy weight, helps control blood glucose, and lowers cholesterol.  Discuss purchasing fresh or frozen vegetable to reduce sodium and  not to add grease, fat or sugar. Consume <18oz of red meat per week. Consume lean cuts of meats and very little of meats high in sodium and nitrates such as pork and lunch meats. Discussed portion control for all food groups.    For heart healthy choices add >50% of whole grains, make half their plate fruits and vegetables. Discuss the difference between starchy vegetables and leafy greens, and how leafy vegetables provide fiber, helps maintain healthy weight, helps control blood glucose, and lowers cholesterol.  Discuss purchasing fresh or frozen vegetable to reduce sodium and not to add grease, fat or sugar. Consume <18oz of red meat per week. Consume lean cuts of meats and very little of meats high in sodium and nitrates such as pork and lunch meats. Discussed portion control for all food groups.  For heart healthy choices add >50% of whole grains, make half their plate fruits and vegetables. Discuss the difference between starchy vegetables and leafy greens, and how leafy vegetables provide fiber, helps maintain healthy weight, helps control blood glucose, and lowers cholesterol.  Discuss purchasing fresh or frozen vegetable to reduce sodium and not to add grease, fat or sugar. Consume <18oz of red meat per week. Consume lean cuts of meats and very little of meats high in sodium and nitrates such as pork and lunch meats. Discussed portion  control for all food groups.       Comment  Patient has steadily been gaining weight since he started which he blames on smoking cessation 5 months ago. He says he is trying to eat less red meats and more vegatables. Patient said at his age, he did not see the point of trying to change his diet alot. Tried to encourage patient to make an effort to change his lifestyle and make healthy diet choices if nothing else to feel better and have more energy. Will continue to monitor.   Patient has completed 21 sessions gaining 2 lbs since last 30 day review and 11.4 lbs overall. His cardiologist has made some adjustments in his diuretic. Patient says he does not plan on changing his diet at this point in his life. Will continue to monitor.   Patient has gained 4 lbs since last 30 day review. Patient continues to express no interest in changing his diet. Will continue to monitor for progress.      Expected Outcome  Patient will begin to work toward meeting the nutritional goals.   Patient will maintain his current weight.   Patient will hopefully begin to express interest in eating heart healthy.         Nutrition Goals Discharge (Final Nutrition Goals Re-Evaluation): Nutrition Goals Re-Evaluation - 04/14/17 1304      Goals   Current Weight  228 lb 9.6 oz (103.7 kg)    Nutrition Goal  For heart healthy choices add >50% of whole grains, make half their plate fruits and vegetables. Discuss the difference between starchy vegetables and leafy greens, and how leafy vegetables provide fiber, helps maintain healthy weight, helps control blood glucose, and lowers cholesterol.  Discuss purchasing fresh or frozen vegetable to reduce sodium and not to add grease, fat or sugar. Consume <18oz of red meat per week. Consume lean cuts of meats and very little of meats high in sodium and nitrates such as pork and lunch meats. Discussed portion control for all food groups.      Comment  Patient has gained 4 lbs since last 30 day  review. Patient continues to  express no interest in changing his diet. Will continue to monitor for progress.     Expected Outcome  Patient will hopefully begin to express interest in eating heart healthy.        Psychosocial: Target Goals: Acknowledge presence or absence of significant depression and/or stress, maximize coping skills, provide positive support system. Participant is able to verbalize types and ability to use techniques and skills needed for reducing stress and depression.  Initial Review & Psychosocial Screening: Initial Psych Review & Screening - 01/10/17 1450      Initial Review   Current issues with  None Identified      Family Dynamics   Good Support System?  Yes      Barriers   Psychosocial barriers to participate in program  There are no identifiable barriers or psychosocial needs.      Screening Interventions   Interventions  Encouraged to exercise       Quality of Life Scores: Quality of Life - 01/10/17 1257      Quality of Life Scores   Health/Function Pre  16.23 %    Socioeconomic Pre  19.21 %    Psych/Spiritual Pre  18.64 %    Family Pre  27.1 %    GLOBAL Pre  18.94 %       PHQ-9: Recent Review Flowsheet Data    Depression screen [facility] [address] 01/10/2017   Decreased Interest 0   Down, Depressed, Hopeless 0   PHQ - 2 Score 0   Altered sleeping 0   Tired, decreased energy 1   Change in appetite 0   Feeling bad or failure about yourself  0   Trouble concentrating 0   Moving slowly or fidgety/restless 0   Suicidal thoughts 0   PHQ-9 Score 1   Difficult doing work/chores Somewhat difficult     Interpretation of Total Score  Total Score Depression Severity:  1-4 = Minimal depression, 5-9 = Mild depression, 10-14 = Moderate depression, 15-19 = Moderately severe depression, 20-27 = Severe depression   Psychosocial Evaluation and Intervention: Psychosocial Evaluation - 01/10/17 1450      Psychosocial Evaluation & Interventions   Interventions   Encouraged to exercise with the program and follow exercise prescription    Continue Psychosocial Services   No Follow up required       Psychosocial Re-Evaluation: Psychosocial Re-Evaluation    [place] Name 02/16/17 0915 03/16/17 1459 04/14/17 1318         Psychosocial Re-Evaluation   Current issues with  None Identified  None Identified  None Identified     Comments  Patient initial QOL score was 23.85 and his PHQ-9 score was 1. Patient shared during the Family matters class with the chaplian that he had lost his son several months ago but he was working through his grief. Will continue to monitor.   Patient continues to work through his grief over his son. Will continue to monitor.   -     Expected Outcomes  Patient will have no psychosocial barriers identified at discharge.   Patient will have no psychosocial barriers identified at discharge.   Patient will have no psychosocial barriers identified at discharge.      Interventions  Encouraged to attend Cardiac Rehabilitation for the exercise;Stress management education;Relaxation education  Encouraged to attend Cardiac Rehabilitation for the exercise;Relaxation education;Stress management education  Stress management education;Encouraged to attend Cardiac Rehabilitation for the exercise;Relaxation education     Continue Psychosocial Services   No Follow up required  No Follow up required  No Follow up required        Psychosocial Discharge (Final Psychosocial Re-Evaluation): Psychosocial Re-Evaluation - 04/14/17 1318      Psychosocial Re-Evaluation   Current issues with  None Identified    Expected Outcomes  Patient will have no psychosocial barriers identified at discharge.     Interventions  Stress management education;Encouraged to attend Cardiac Rehabilitation for the exercise;Relaxation education    Continue Psychosocial Services   No Follow up required       Vocational Rehabilitation: Provide vocational rehab assistance to  qualifying candidates.   Vocational Rehab Evaluation & Intervention: Vocational Rehab - 01/10/17 1444      Initial Vocational Rehab Evaluation & Intervention   Assessment shows need for Vocational Rehabilitation  No       Education: Education Goals: Education classes will be provided on a weekly basis, covering required topics. Participant will state understanding/return demonstration of topics presented.  Learning Barriers/Preferences: Learning Barriers/Preferences - 01/10/17 1444      Learning Barriers/Preferences   Learning Barriers  Hearing    Learning Preferences  Skilled Demonstration;Individual Instruction;Group Instruction       Education Topics: Hypertension, Hypertension Reduction -Define heart disease and high blood pressure. Discus how high blood pressure affects the body and ways to reduce high blood pressure.   CARDIAC REHAB PHASE II EXERCISE from 04/12/2017 in [place]  Date  03/22/17  Educator  [place]  Instruction Review Code  2- Demonstrated Understanding      Exercise and Your Heart -Discuss why it is important to exercise, the FITT principles of exercise, normal and abnormal responses to exercise, and how to exercise safely.   CARDIAC REHAB PHASE II EXERCISE from 04/12/2017 in [place]  Date  03/29/17  Educator  DJ  Instruction Review Code  2- Demonstrated Understanding      Angina -Discuss definition of angina, causes of angina, treatment of angina, and how to decrease risk of having angina.   CARDIAC REHAB PHASE II EXERCISE from 04/12/2017 in [place]  Date  04/05/17  Educator  D. Coad  Instruction Review Code  2- Demonstrated Understanding      Cardiac Medications -Review what the following cardiac medications are used for, how they affect the body, and side effects that may occur when taking the medications.  Medications include Aspirin, Beta blockers, calcium channel  blockers, ACE Inhibitors, angiotensin receptor blockers, diuretics, digoxin, and antihyperlipidemics.   CARDIAC REHAB PHASE II EXERCISE from 04/12/2017 in [place]  Date  04/12/17  Educator  DJ  Instruction Review Code  2- Demonstrated Understanding      Congestive Heart Failure -Discuss the definition of CHF, how to live with CHF, the signs and symptoms of CHF, and how keep track of weight and sodium intake.   Heart Disease and Intimacy -Discus the effect sexual activity has on the heart, how changes occur during intimacy as we age, and safety during sexual activity.   CARDIAC REHAB PHASE II EXERCISE from 04/12/2017 in [place]  Date  01/18/17  Educator  DC  Instruction Review Code  2- Demonstrated Understanding      Smoking Cessation / COPD -Discuss different methods to quit smoking, the health benefits of quitting smoking, and the definition of COPD.   CARDIAC REHAB PHASE II EXERCISE from 04/12/2017 in [place]  Date  01/25/17  Educator  DJ  Instruction Review Code  2- Demonstrated  Understanding      Nutrition I: Fats -Discuss the types of cholesterol, what cholesterol does to the heart, and how cholesterol levels can be controlled.   CARDIAC REHAB PHASE II EXERCISE from 04/12/2017 in [place]  Date  02/01/17  Educator  DC  Instruction Review Code  2- Demonstrated Understanding      Nutrition II: Labels -Discuss the different components of food labels and how to read food label   Heart Parts and Heart Disease -Discuss the anatomy of the heart, the pathway of blood circulation through the heart, and these are affected by heart disease.   CARDIAC REHAB PHASE II EXERCISE from 04/12/2017 in [place]  Date  02/15/17  Educator  Dj  Instruction Review Code  2- Demonstrated Understanding      Stress I: Signs and Symptoms -Discuss the causes of  stress, how stress may lead to anxiety and depression, and ways to limit stress.   CARDIAC REHAB PHASE II EXERCISE from 04/12/2017 in [place]  Date  02/22/17  Educator  DC  Instruction Review Code  2- Demonstrated Understanding      Stress II: Relaxation -Discuss different types of relaxation techniques to limit stress.   CARDIAC REHAB PHASE II EXERCISE from 04/12/2017 in [place]  Date  03/01/17  Educator  DJ  Instruction Review Code  2- Demonstrated Understanding      Warning Signs of Stroke / TIA -Discuss definition of a stroke, what the signs and symptoms are of a stroke, and how to identify when someone is having stroke.   CARDIAC REHAB PHASE II EXERCISE from 04/12/2017 in [place]  Date  03/08/17  Educator  [place]  Instruction Review Code  2- Demonstrated Understanding      Knowledge Questionnaire Score: Knowledge Questionnaire Score - 01/10/17 1444      Knowledge Questionnaire Score   Pre Score  21/24       Core Components/Risk Factors/Patient Goals at Admission: Personal Goals and Risk Factors at Admission - 01/10/17 1449      Core Components/Risk Factors/Patient Goals on Admission    Weight Management  Obesity    Personal Goal Other  Yes    Personal Goal  Gain streanth and stamina    Intervention  Attend CR 3 x week and supplement with home exercise 2 x week    Expected Outcomes  Reach personal goals.        Core Components/Risk Factors/Patient Goals Review:  Goals and Risk Factor Review    Row Name 01/10/17 1449 02/16/17 0912 03/16/17 1449 04/14/17 1308       Core Components/Risk Factors/Patient Goals Review   Personal Goals Review  Weight Management/Obesity;Lipids  Weight Management/Obesity Get stronger; gain stamina.   Weight Management/Obesity Get stronger; gain stamina.  Weight Management/Obesity Get stronger; gain stamina.     Review  -  Patient has completed 13 sessions gaining  7 lbs. Patient is doing well in the program. He says he feels somewhat stronger since he started. He stopped smoking 5 months ago and remains tobacco free. Will continue to monitor.   Patient has completed 21 sessions. He has not attended in past week due to new onset chest pain. He is also hypertensive. A nuclear stress test was done 03/14/17. Cardiologist increased his Metoprolol dosage. He was okayed to return to CR. Patient has been progressing well in the program stating he does have more energy and stamina and he believes  the program is helping him. Will continue to monitor.   Patient has completed 33 sessions gaining 4 lbs since last 30 day review. He continues to do well in the program with progression. He says he is getting stronger and he feels the program is helping him. Will continue to monitor for progress.     Expected Outcomes  -  Patient will complete the program meeting his personal goals.   Patient will complete the program and continue to meet his personal goals.   Patient will complete the program and continue to meet her personal goals.        Core Components/Risk Factors/Patient Goals at Discharge (Final Review):  Goals and Risk Factor Review - 04/14/17 1308      Core Components/Risk Factors/Patient Goals Review   Personal Goals Review  Weight Management/Obesity Get stronger; gain stamina.     Review  Patient has completed 33 sessions gaining 4 lbs since last 30 day review. He continues to do well in the program with progression. He says he is getting stronger and he feels the program is helping him. Will continue to monitor for progress.     Expected Outcomes  Patient will complete the program and continue to meet her personal goals.        ITP Comments: ITP Comments    Row Name 01/18/17 0742 01/19/17 1314         ITP Comments  Patient new to program. Plans to start 01/18/17.   Patient attended Family Matters class with hospital chaplin to discuss how his recent diagnosis  has effected his life.          Comments: ITP 30 Day REVIEW Patient doing well in the program. Will continue to monitor for progress.

## 2017-04-14 NOTE — Progress Notes (Signed)
Daily Session Note  Patient Details  Name: TENNYSON WACHA MRN: [id number] Date of Birth: [birth year] Referring Provider:     [place] from 01/10/2017 in [place]  Referring Provider  Dr. Harl Bowie      Encounter Date: 04/14/2017  Check In: Session Check In - 04/14/17 1052      Check-In   Location  [facility]    Staff Present  Suzanne Boron, BS, EP, Exercise Physiologist;Debra Wynetta Emery, RN, BSN    Supervising physician immediately available to respond to emergencies  See telemetry face sheet for immediately available MD    Medication changes reported      No    Fall or balance concerns reported     No    Warm-up and Cool-down  Performed as group-led instruction    Resistance Training Performed  Yes    VAD Patient?  No      Pain Assessment   Currently in Pain?  No/denies    Pain Score  0-No pain    Multiple Pain Sites  No       Capillary Blood Glucose: No results found for this or any previous visit (from the past 24 hour(s)).    Social History   Tobacco Use  Smoking Status Former Smoker  . Packs/day: 1.00  . Years: 40.00  . Pack years: 40.00  . Types: Cigarettes  . Start date: 02/20/1959  . Last attempt to quit: 09/13/2016  . Years since quitting: 0.5  Smokeless Tobacco Never Used  Tobacco Comment   10/7 2-8 cigarettes per day. cutting back- AJ    Goals Met:  Independence with exercise equipment Exercise tolerated well No report of cardiac concerns or symptoms Strength training completed today  Goals Unmet:  Not Applicable  Comments: Check out 1200   Dr. Kate Sable is Medical Director for [place] and Pulmonary Rehab.

## 2017-04-17 ENCOUNTER — Encounter (HOSPITAL_COMMUNITY)
Admission: RE | Admit: 2017-04-17 | Discharge: 2017-04-17 | Disposition: A | Discharge status: Home / Self Care | Payer: Medicare Other | Source: Ambulatory Visit | Attending: Cardiovascular Disease | Admitting: Cardiovascular Disease

## 2017-04-17 VITALS — Ht 69.0 in | Wt 229.5 lb

## 2017-04-17 DIAGNOSIS — Z951 Presence of aortocoronary bypass graft: Secondary | ICD-10-CM | POA: Diagnosis present

## 2017-04-17 NOTE — Progress Notes (Signed)
Daily Session Note  Patient Details  Name: [name] MRN: [id number] Date of Birth: [birth year] Referring Provider:     CARDIAC REHAB PHASE II ORIENTATION from 01/10/2017 in [place]  Referring Provider  Dr. Harl Bowie      Encounter Date: 04/17/2017  Check In: Session Check In - 04/17/17 1100      Check-In   Location  [facility]    Staff Present  Aundra Dubin, RN, BSN;Gregory Luther Parody, BS, EP, Exercise Physiologist    Supervising physician immediately available to respond to emergencies  See telemetry face sheet for immediately available MD    Medication changes reported      No    Fall or balance concerns reported     No    Warm-up and Cool-down  Performed as group-led instruction    Resistance Training Performed  Yes    VAD Patient?  No      Pain Assessment   Currently in Pain?  No/denies    Pain Score  0-No pain    Multiple Pain Sites  No       Capillary Blood Glucose: No results found for this or any previous visit (from the past 24 hour(s)).    Social History   Tobacco Use  Smoking Status Former Smoker  . Packs/day: 1.00  . Years: 40.00  . Pack years: 40.00  . Types: Cigarettes  . Start date: 02/20/1959  . Last attempt to quit: 09/13/2016  . Years since quitting: 0.5  Smokeless Tobacco Never Used  Tobacco Comment   10/7 2-8 cigarettes per day. cutting back- AJ    Goals Met:  Independence with exercise equipment Exercise tolerated well No report of cardiac concerns or symptoms Strength training completed today  Goals Unmet:  Not Applicable  Comments: Check out 1200.   Dr. Kate Sable is Medical Director for [facility] Cardiac and Pulmonary Rehab.

## 2017-04-19 ENCOUNTER — Encounter (HOSPITAL_COMMUNITY)
Admission: RE | Admit: 2017-04-19 | Discharge: 2017-04-19 | Disposition: A | Discharge status: Home / Self Care | Payer: Medicare Other | Source: Ambulatory Visit | Attending: Cardiology | Admitting: Cardiology

## 2017-04-19 DIAGNOSIS — Z951 Presence of aortocoronary bypass graft: Secondary | ICD-10-CM | POA: Diagnosis not present

## 2017-04-19 NOTE — Progress Notes (Signed)
Daily Session Note  Patient Details  Name: ELDO UMANZOR MRN: [id number] Date of Birth: Jun 15, 1945 Referring Provider:     CARDIAC REHAB PHASE II ORIENTATION from 01/10/2017 in [place]  Referring Provider  Dr. Harl Bowie      Encounter Date: 04/19/2017  Check In: Session Check In - 04/19/17 1100      Check-In   Location  [facility]    Staff Present  Aundra Dubin, RN, BSN;Gregory Luther Parody, BS, EP, Exercise Physiologist    Supervising physician immediately available to respond to emergencies  See telemetry face sheet for immediately available MD    Medication changes reported      No    Fall or balance concerns reported     No    Warm-up and Cool-down  Performed as group-led instruction    Resistance Training Performed  Yes    VAD Patient?  No      Pain Assessment   Currently in Pain?  No/denies    Pain Score  0-No pain    Multiple Pain Sites  No       Capillary Blood Glucose: No results found for this or any previous visit (from the past 24 hour(s)).    Social History   Tobacco Use  Smoking Status Former Smoker  . Packs/day: 1.00  . Years: 40.00  . Pack years: 40.00  . Types: Cigarettes  . Start date: 02/20/1959  . Last attempt to quit: 09/13/2016  . Years since quitting: 0.5  Smokeless Tobacco Never Used  Tobacco Comment   10/7 2-8 cigarettes per day. cutting back- AJ    Goals Met:  Independence with exercise equipment Exercise tolerated well No report of cardiac concerns or symptoms Strength training completed today  Goals Unmet:  Not Applicable  Comments: Check out 1200.   Dr. Kate Sable is Medical Director for [facility] Card[facility] and Pulmonary Rehab.

## 2017-04-21 ENCOUNTER — Encounter (HOSPITAL_COMMUNITY)
Admission: RE | Admit: 2017-04-21 | Discharge: 2017-04-21 | Disposition: A | Discharge status: Home / Self Care | Payer: Medicare Other | Source: Ambulatory Visit | Attending: Cardiology | Admitting: Cardiology

## 2017-04-21 DIAGNOSIS — Z951 Presence of aortocoronary bypass graft: Secondary | ICD-10-CM | POA: Diagnosis not present

## 2017-04-21 NOTE — Progress Notes (Signed)
Daily Session Note  Patient Details  Name: [name] MRN: [id number] Date of Birth: 05/11/1946 Referring Provider:     CARDIAC REHAB PHASE II ORIENTATION from 01/10/2017 in [place]  Referring Provider  Dr. Harl Bowie      Encounter Date: 04/21/2017  Check In: Session Check In - 04/21/17 1100      Check-In   Location  [facility]    Staff Present  Marua Qin Angelina Pih, MS, EP, [facility], Exercise Physiologist;Debra Wynetta Emery, RN, BSN    Supervising physician immediately available to respond to emergencies  See telemetry face sheet for immediately available MD    Medication changes reported      No    Fall or balance concerns reported     No    Warm-up and Cool-down  Performed as group-led instruction    Resistance Training Performed  Yes    VAD Patient?  No      Pain Assessment   Currently in Pain?  No/denies    Multiple Pain Sites  No       Capillary Blood Glucose: No results found for this or any previous visit (from the past 24 hour(s)).    Social History   Tobacco Use  Smoking Status Former Smoker  . Packs/day: 1.00  . Years: 40.00  . Pack years: 40.00  . Types: Cigarettes  . Start date: 02/20/1959  . Last attempt to quit: 09/13/2016  . Years since quitting: 0.6  Smokeless Tobacco Never Used  Tobacco Comment   10/7 2-8 cigarettes per day. cutting back- AJ    Goals Met:  Independence with exercise equipment Exercise tolerated well No report of cardiac concerns or symptoms Strength training completed today  Goals Unmet:  Not Applicable  Comments: Check out: 1200   Dr. Kate Sable is Medical Director for [place] and Pulmonary Rehab.

## 2017-04-24 ENCOUNTER — Encounter (HOSPITAL_COMMUNITY): Payer: Medicare Other

## 2017-04-27 NOTE — Progress Notes (Signed)
Discharge Progress Report  Patient Details  Name: [name] MRN: [id number] Date of Birth: 03/06/1946 Referring Provider:     [facility] [place] from 01/10/2017 in [place]  Referring Provider  Dr. Harl Bowie       Number of Visits: 36  Reason for Discharge:  Patient reached a stable level of exercise. Patient independent in their exercise. Patient has met program and personal goals.  Smoking History:  Social History   Tobacco Use  Smoking Status Former Smoker  . Packs/day: 1.00  . Years: 40.00  . Pack years: 40.00  . Types: Cigarettes  . Start date: 02/20/1959  . Last attempt to quit: 09/13/2016  . Years since quitting: 0.6  Smokeless Tobacco Never Used  Tobacco Comment   10/7 2-8 cigarettes per day. cutting back- AJ    Diagnosis:  S/P CABG x 2  ADL [facility]:   Initial Exercise Prescription: Initial Exercise Prescription - 01/10/17 1400      Date of Initial Exercise RX and Referring Provider   Date  01/10/17    Referring Provider  Dr. Harl Bowie      Treadmill   MPH  1.5    Grade  0    Minutes  15    METs  2.2      NuStep   Level  2    SPM  76    Minutes  20    METs  2      Prescription Details   Frequency (times per week)  3    Duration  Progress to 30 minutes of continuous aerobic without signs/symptoms of physical distress      Intensity   THRR 40-80% of Max Heartrate  [phone]    Ratings of Perceived Exertion  11-13    Perceived Dyspnea  0-4      Progression   Progression  Continue progressive overload as per policy without signs/symptoms or physical distress.      Resistance Training   Training Prescription  Yes    Weight  1    Reps  10-15       Discharge Exercise Prescription (Final Exercise Prescription Changes): Exercise Prescription Changes - 04/11/17 0800      Response to Exercise   Blood Pressure (Admit)  160/60    Blood Pressure (Exercise)  168/64    Blood Pressure (Exit)  144/58     Heart Rate (Admit)  51 bpm    Heart Rate (Exercise)  75 bpm    Heart Rate (Exit)  61 bpm    Rating of Perceived Exertion (Exercise)  11    Duration  Progress to 30 minutes of  aerobic without signs/symptoms of physical distress    Intensity  THRR New 91-110-130      Progression   Progression  Continue to progress workloads to maintain intensity without signs/symptoms of physical distress.      Resistance Training   Training Prescription  Yes    Weight  4    Reps  10-15      NuStep   Level  3    SPM  94    Minutes  15    METs  2.6      Arm Ergometer   Level  2.7    Watts  33    Minutes  2    METs  3.5      Home Exercise Plan   Plans to continue exercise at  Home (comment)    Frequency  Add 2 additional days to program exercise sessions.    Initial Home Exercises Provided  01/18/17       Functional Capacity: 6 Minute Walk    Row Name 01/10/17 1403 04/26/17 1403       6 Minute Walk   Phase  Initial  Discharge    Distance  1150 feet  1300 feet    Distance % Change  0 %  13.04 %    Distance Feet Change  0 ft  150 ft    Walk Time  6 minutes  6 minutes    # of Rest Breaks  0  0    MPH  2.17  2.46    METS  2.66  2.88    RPE  12  13    Perceived Dyspnea   12  13    VO2 Peak  8.33  10.08    Symptoms  No  No    Resting HR  62 bpm  61 bpm    Resting BP  122/52  146/60    Resting Oxygen Saturation   97 %  97 %    Exercise Oxygen Saturation  during 6 min walk  92 %  96 %    Max Ex. HR  90 bpm  93 bpm    Max Ex. BP  138/64  190/64    2 Minute Post BP  126/58  158/60       Psychological, QOL, Others - Outcomes: PHQ 2/9: Depression screen [facility] 2/9 04/27/2017 01/10/2017  Decreased Interest 0 0  Down, Depressed, Hopeless 0 0  PHQ - 2 Score 0 0  Altered sleeping 0 0  Tired, decreased energy 1 1  Change in appetite 2 0  Feeling bad or failure about yourself  0 0  Trouble concentrating 0 0  Moving slowly or fidgety/restless 0 0  Suicidal thoughts 0 0  PHQ-9 Score 3  1  Difficult doing work/chores Not difficult at all Somewhat difficult    Quality of Life: Quality of Life - 04/26/17 1405      Quality of Life Scores   Health/Function Pre  16.23 %    Health/Function Post  18.03 %    Health/Function % Change  11.09 %    Socioeconomic Pre  19.21 %    Socioeconomic Post  19.14 %    Socioeconomic % Change   -0.36 %    Psych/Spiritual Pre  18.64 %    Psych/Spiritual Post  17.29 %    Psych/Spiritual % Change  -7.24 %    Family Pre  27.1 %    Family Post  24.6 %    Family % Change  -9.23 %    GLOBAL Pre  18.94 %    GLOBAL Post  19.07 %    GLOBAL % Change  0.69 %       Personal Goals: Goals established at orientation with interventions provided to work toward goal. Personal Goals and Risk Factors at Admission - 01/10/17 1449      Core Components/Risk Factors/Patient Goals on Admission    Weight Management  Obesity    Personal Goal Other  Yes    Personal Goal  Gain streanth and stamina    Intervention  Attend CR 3 x week and supplement with home exercise 2 x week    Expected Outcomes  Reach personal goals.         Personal Goals Discharge: Goals and Risk Factor Review    Row Name 01/10/17  1449 02/16/17 0912 03/16/17 1449 04/14/17 1308 04/27/17 1615     Core Components/Risk Factors/Patient Goals Review   Personal Goals Review  Weight Management/Obesity;Lipids  Weight Management/Obesity Get stronger; gain stamina.   Weight Management/Obesity Get stronger; gain stamina.  Weight Management/Obesity Get stronger; gain stamina.   Weight Management/Obesity Get stronger; gain stamina.    Review  -  Patient has completed 13 sessions gaining 7 lbs. Patient is doing well in the program. He says he feels somewhat stronger since he started. He stopped smoking 5 months ago and remains tobacco free. Will continue to monitor.   Patient has completed 21 sessions. He has not attended in past week due to new onset chest pain. He is also hypertensive. A nuclear  stress test was done 03/14/17. Cardiologist increased his Metoprolol dosage. He was okayed to return to CR. Patient has been progressing well in the program stating he does have more energy and stamina and he believes the program is helping him. Will continue to monitor.   Patient has completed 33 sessions gaining 4 lbs since last 30 day review. He continues to do well in the program with progression. He says he is getting stronger and he feels the program is helping him. Will continue to monitor for progress.   Patient graduated completing 36 sessions gaining 15.9 lbs. He attributes some of the weight gain to fluid. His medficts score improved by 25%. He says the program helped him meet is goal. He does have more energy and improved endurance. His exit measurements improved in grip strength and his exit walk test improved by 13.04%. He says he feels better overall and plans to continue exercising by walking at home.    Expected Outcomes  -  Patient will complete the program meeting his personal goals.   Patient will complete the program and continue to meet his personal goals.   Patient will complete the program and continue to meet her personal goals.   Patient will continue to exercise at home by walking and continue to meet his goal of more energy and endurance and continue to work toward meeting his weight loss goal. CR will follow up for one year.       Exercise Goals and Review: Exercise Goals    Row Name 01/10/17 1446             Exercise Goals   Increase Physical Activity  Yes       Intervention  Provide advice, education, support and counseling about physical activity/exercise needs.;Develop an individualized exercise prescription for aerobic and resistive training based on initial evaluation findings, risk stratification, comorbidities and participant's personal goals.       Expected Outcomes  Achievement of increased cardiorespiratory fitness and enhanced flexibility, muscular endurance  and strength shown through measurements of functional capacity and personal statement of participant.       Increase Strength and Stamina  Yes       Intervention  Provide advice, education, support and counseling about physical activity/exercise needs.;Develop an individualized exercise prescription for aerobic and resistive training based on initial evaluation findings, risk stratification, comorbidities and participant's personal goals.       Expected Outcomes  Achievement of increased cardiorespiratory fitness and enhanced flexibility, muscular endurance and strength shown through measurements of functional capacity and personal statement of participant.       Able to understand and use rate of perceived exertion (RPE) scale  Yes       Intervention  Provide education and explanation  on how to use RPE scale       Expected Outcomes  Short Term: Able to use RPE daily in rehab to express subjective intensity level;Long Term:  Able to use RPE to guide intensity level when exercising independently       Able to understand and use Dyspnea scale  Yes       Intervention  Provide education and explanation on how to use Dyspnea scale       Expected Outcomes  Short Term: Able to use Dyspnea scale daily in rehab to express subjective sense of shortness of breath during exertion;Long Term: Able to use Dyspnea scale to guide intensity level when exercising independently       Knowledge and understanding of Target Heart Rate Range (THRR)  Yes       Intervention  Provide education and explanation of THRR including how the numbers were predicted and where they are located for reference       Expected Outcomes  Short Term: Able to use daily as guideline for intensity in rehab;Long Term: Able to use THRR to govern intensity when exercising independently       Able to check pulse independently  Yes       Intervention  Review the importance of being able to check your own pulse for safety during independent exercise        Expected Outcomes  Short Term: Able to explain why pulse checking is important during independent exercise;Long Term: Able to check pulse independently and accurately       Understanding of Exercise Prescription  Yes       Intervention  Provide education, explanation, and written materials on patient's individual exercise prescription       Expected Outcomes  Short Term: Able to explain program exercise prescription;Long Term: Able to explain home exercise prescription to exercise independently          Nutrition & Weight - Outcomes: Pre Biometrics - 01/10/17 1253      Pre Biometrics   Height  _0  (1.753 m)    Waist Circumference  39 inches    Hip Circumference  40 inches    Waist to Hip Ratio  0.98 %    Triceps Skinfold  13 mm    % Body Fat  27.8 %    Grip Strength  71.2 kg    Flexibility  0 in    Single Leg Stand  4 seconds      Post Biometrics - 04/26/17 1404       Post  Biometrics   Height  _1  (1.753 m)    Weight  229 lb 7.6 oz (104.1 kg)    Waist Circumference  42 inches    Hip Circumference  41 inches    Waist to Hip Ratio  1.02 %    BMI (Calculated)  33.87    Triceps Skinfold  12 mm    % Body Fat  29.8 %    Grip Strength  77.6 kg    Flexibility  0 in    Single Leg Stand  4 seconds       Nutrition: Nutrition Therapy & Goals - 01/19/17 1314      Personal Nutrition Goals   Nutrition Goal  For heart healthy choices add >50% of whole grains, make half their plate fruits and vegetables. Discuss the difference between starchy vegetables and leafy greens, and how leafy vegetables provide fiber, helps maintain healthy weight, helps control blood glucose, and lowers  cholesterol.  Discuss purchasing fresh or frozen vegetable to reduce sodium and not to add grease, fat or sugar. Consume <18oz of red meat per week. Consume lean cuts of meats and very little of meats high in sodium and nitrates such as pork and lunch meats. Discussed portion control for all food groups.       Additional Goals?  No      Intervention Plan   Intervention  Prescribe, educate and counsel regarding individualized specific dietary modifications aiming towards targeted core components such as weight, hypertension, lipid management, diabetes, heart failure and other comorbidities.;Nutrition handout(s) given to patient.    Expected Outcomes  Short Term Goal: Understand basic principles of dietary content, such as calories, fat, sodium, cholesterol and nutrients.;Short Term Goal: A plan has been developed with personal nutrition goals set during dietitian appointment.;Long Term Goal: Adherence to prescribed nutrition plan.       Nutrition Discharge: Nutrition Assessments - 04/27/17 1611      MEDFICTS Scores   Pre Score  27    Post Score  2    Score Difference  -25       Education Questionnaire Score: Knowledge Questionnaire Score - 04/27/17 1611      Knowledge Questionnaire Score   Pre Score  21/24    Post Score  21/24       Goals reviewed with patient; copy given to patient.

## 2017-04-27 NOTE — Progress Notes (Signed)
Cardiac Individual Treatment Plan  Patient Details  Name: [name] MRN: [id number] Date of Birth: 1946-01-20 Referring Provider:     [facility] from 01/10/2017 in [place]  Referring Provider  Dr. Harl Bowie      Initial Encounter Date:    CARDIAC REHAB PHASE II ORIENTATION from 01/10/2017 in [place]  Date  01/10/17  Referring Provider  Dr. Harl Bowie      Visit Diagnosis: S/P CABG x 2  Patient's Home Medications on Admission:  Current Outpatient Medications:  .  acetaminophen (TYLENOL) 325 MG tablet, Take 2 tablets (650 mg total) by mouth every 6 (six) hours as needed for mild pain., Disp: , Rfl:  .  amLODipine (NORVASC) 10 MG tablet, Take 1 tablet (10 mg total) by mouth daily., Disp: , Rfl:  .  aspirin 81 MG chewable tablet, Chew 1 tablet (81 mg total) by mouth daily., Disp: , Rfl:  .  atorvastatin (LIPITOR) 40 MG tablet, Take 1 tablet (40 mg total) by mouth daily., Disp: 30 tablet, Rfl: 1 .  clopidogrel (PLAVIX) 75 MG tablet, Take 1 tablet (75 mg total) by mouth daily., Disp: 30 tablet, Rfl: 1 .  ergocalciferol (VITAMIN D2) 50000 UNITS capsule, Take 50,000 Units by mouth every Sunday. , Disp: , Rfl:  .  furosemide (LASIX) 40 MG tablet, Take 40 mg by mouth 2 (two) times daily. WEIGHT >220LBS TAKE 60 AM AND 40 PM UNTIL WEIGHT COMES DOWN THEN RESUME 40 MG BID, Disp: , Rfl:  .  levothyroxine (SYNTHROID, LEVOTHROID) 25 MCG tablet, Take 25 mcg by mouth daily before breakfast., Disp: , Rfl:  .  lisinopril (PRINIVIL,ZESTRIL) 10 MG tablet, Take 10 mg by mouth daily., Disp: , Rfl:  .  lisinopril (PRINIVIL,ZESTRIL) 10 MG tablet, TAKE 1 TABLET BY MOUTH EVERY DAY, Disp: 30 tablet, Rfl: 6 .  magnesium oxide (MAG-OX) 400 MG tablet, Take 400 mg by mouth daily., Disp: , Rfl:  .  metoprolol succinate (TOPROL-XL) 25 MG 24 hr tablet, Take 1.5 tablets (37.5 mg total) by mouth daily., Disp: 135 tablet, Rfl: 3 .  Omega-3 Fatty Acids  (RA FISH OIL) 1000 MG CAPS, Take 2,000 mg by mouth 2 (two) times daily. , Disp: , Rfl:  .  omeprazole (PRILOSEC) 20 MG capsule, Take 40 mg by mouth daily. , Disp: , Rfl:  .  potassium chloride SA (K-DUR,KLOR-CON) 20 MEQ tablet, Take 20 mEq by mouth daily., Disp: , Rfl:  .  PRESCRIPTION MEDICATION, 1 each. Cortisone Injection into each knee every 4 months., Disp: , Rfl:   Past Medical History: Past Medical History:  Diagnosis Date  . Acute myocardial infarction of other inferior wall, initial episode of care   . Anginal pain ([place])   . Anxiety   . Arthritis   . Cancer [facility])    bladder 2013 removed, has cystoscopy 10/2016, has returned x 2  . COPD (chronic obstructive pulmonary disease) ([place])   . Coronary artery disease    Artery bypass graft Jan 2008  . Dyspnea   . Dysrhythmia   . GERD (gastroesophageal reflux disease)   . Heart murmur   . History of hiatal hernia   . Hyperlipidemia   . Hypertension   . Hypothyroidism   . PONV (postoperative nausea and vomiting)   . Sleep apnea   . Stroke ([place])   . Tobacco user     Tobacco Use: Social History   Tobacco Use  Smoking Status Former Smoker  .  Packs/day: 1.00  . Years: 40.00  . Pack years: 40.00  . Types: Cigarettes  . Start date: 02/20/1959  . Last attempt to quit: 09/13/2016  . Years since quitting: 0.6  Smokeless Tobacco Never Used  Tobacco Comment   10/7 2-8 cigarettes per day. cutting back- AJ    Labs: Recent Review Flowsheet Data    Labs for ITP Cardiac and Pulmonary Rehab Latest Ref Rng & Units 09/19/2016 09/19/2016 09/19/2016 09/19/2016 09/20/2016   Cholestrol 0 - 200 mg/dL - - - - -   LDLCALC 0 - 99 mg/dL - - - - -   LDLDIRECT mg/dL - - - - -   HDL >40 mg/dL - - - - -   Trlycerides <150 mg/dL - - - - -   Hemoglobin A1c 4.8 - 5.6 % - - - - -   PHART 7.350 - 7.450 7.360 - 7.333(L) 7.364 -   PCO2ART 32.0 - 48.0 mmHg 45.1 - 45.6 41.8 -   HCO3 20.0 - 28.0 mmol/L 25.7 - 24.4 23.9 -   TCO2 0 - 100 mmol/L _0 ACIDBASEDEF 0.0 - 2.0 mmol/L - - 2.0 1.0 -   O2SAT % 98.0 - 94.0 95.0 -      Capillary Blood Glucose: Lab Results  Component Value Date   GLUCAP 103 (H) 09/23/2016   GLUCAP 99 09/22/2016   GLUCAP 108 (H) 09/22/2016   GLUCAP 102 (H) 09/22/2016   GLUCAP 92 09/22/2016     Exercise Target Goals:    Exercise Program Goal: Individual exercise prescription set with THRR, safety & activity barriers. Participant demonstrates ability to understand and report RPE using BORG scale, to self-measure pulse accurately, and to acknowledge the importance of the exercise prescription.  Exercise Prescription Goal: Starting with aerobic activity 30 plus minutes a day, 3 days per week for initial exercise prescription. Provide home exercise prescription and guidelines that participant acknowledges understanding prior to discharge.  Activity Barriers & Risk Stratification: Activity Barriers & Cardiac Risk Stratification - 01/10/17 1503      Activity Barriers & Cardiac Risk Stratification   Activity Barriers  -- has knee pain with exertion    Cardiac Risk Stratification  High       6 Minute Walk: 6 Minute Walk    Row Name 01/10/17 1403 04/26/17 1403       6 Minute Walk   Phase  Initial  Discharge    Distance  1150 feet  1300 feet    Distance % Change  0 %  13.04 %    Distance Feet Change  0 ft  150 ft    Walk Time  6 minutes  6 minutes    # of Rest Breaks  0  0    MPH  2.17  2.46    METS  2.66  2.88    RPE  12  13    Perceived Dyspnea   12  13    VO2 Peak  8.33  10.08    Symptoms  No  No    Resting HR  62 bpm  61 bpm    Resting BP  122/52  146/60    Resting Oxygen Saturation   97 %  97 %    Exercise Oxygen Saturation  during 6 min walk  92 %  96 %    Max Ex. HR  90 bpm  93 bpm    Max Ex. BP  138/64  190/64  2 Minute Post BP  126/58  158/60       Oxygen Initial Assessment:   Oxygen Re-Evaluation:   Oxygen Discharge (Final Oxygen Re-Evaluation):   Initial Exercise  Prescription: Initial Exercise Prescription - 01/10/17 1400      Date of Initial Exercise RX and Referring Provider   Date  01/10/17    Referring Provider  Dr. Harl Bowie      Treadmill   MPH  1.5    Grade  0    Minutes  15    METs  2.2      NuStep   Level  2    SPM  76    Minutes  20    METs  2      Prescription Details   Frequency (times per week)  3    Duration  Progress to 30 minutes of continuous aerobic without signs/symptoms of physical distress      Intensity   THRR 40-80% of Max Heartrate  [phone]    Ratings of Perceived Exertion  11-13    Perceived Dyspnea  0-4      Progression   Progression  Continue progressive overload as per policy without signs/symptoms or physical distress.      Resistance Training   Training Prescription  Yes    Weight  1    Reps  10-15       Perform Capillary Blood Glucose checks as needed.  Exercise Prescription Changes:  Exercise Prescription Changes    Row Name 01/18/17 1200 01/31/17 0700 02/13/17 1400 02/27/17 1400 03/10/17 1400     Response to Exercise   Blood Pressure (Admit)  -  144/50  140/56  146/52  146/50   Blood Pressure (Exercise)  -  166/60  166/60  168/60  160/60   Blood Pressure (Exit)  -  140/52  146/52  136/50  142/56   Heart Rate (Admit)  -  54 bpm  56 bpm  54 bpm  56 bpm   Heart Rate (Exercise)  -  67 bpm  76 bpm  82 bpm  66 bpm   Heart Rate (Exit)  -  63 bpm  64 bpm  64 bpm  58 bpm   Rating of Perceived Exertion (Exercise)  -  _0 Duration  -  Progress to 30 minutes of  aerobic without signs/symptoms of physical distress  Progress to 30 minutes of  aerobic without signs/symptoms of physical distress  Progress to 30 minutes of  aerobic without signs/symptoms of physical distress  Progress to 30 minutes of  aerobic without signs/symptoms of physical distress   Intensity  -  THRR New [phone]  THRR New [phone]  THRR New [phone]  THRR New [phone]     Progression   Progression  -   Continue to progress workloads to maintain intensity without signs/symptoms of physical distress.  Continue to progress workloads to maintain intensity without signs/symptoms of physical distress.  Continue to progress workloads to maintain intensity without signs/symptoms of physical distress.  Continue to progress workloads to maintain intensity without signs/symptoms of physical distress.     Resistance Training   Training Prescription  -  Yes  Yes  Yes  Yes   Weight  -  _1 Reps  -  10-15  10-15  10-15  10-15     NuStep   Level  -  _2 3  SPM  -  90  90  98  96   Minutes  -  _0 METs  -  2.4  2.3  2.5  2.5     Arm Ergometer   Level  -  2.2  2.2  2.3  2.5   Watts  -  _1 Minutes  -  _2 METs  -  _3 3.2     Home Exercise Plan   Plans to continue exercise at  Home (comment)  Home (comment)  Home (comment)  Home (comment)  Home (comment)   Frequency  Add 2 additional days to program exercise sessions.  Add 2 additional days to program exercise sessions.  Add 2 additional days to program exercise sessions.  Add 2 additional days to program exercise sessions.  Add 2 additional days to program exercise sessions.   Initial Home Exercises Provided  01/18/17  01/18/17  01/18/17  01/18/17  01/18/17   Row Name 03/29/17 1400 04/11/17 0800           Response to Exercise   Blood Pressure (Admit)  136/56  160/60      Blood Pressure (Exercise)  158/64  168/64      Blood Pressure (Exit)  132/60  144/58      Heart Rate (Admit)  47 bpm  51 bpm      Heart Rate (Exercise)  92 bpm  75 bpm      Heart Rate (Exit)  56 bpm  61 bpm      Rating of Perceived Exertion (Exercise)  11  11      Duration  Progress to 30 minutes of  aerobic without signs/symptoms of physical distress  Progress to 30 minutes of  aerobic without signs/symptoms of physical distress      Intensity  THRR New [phone]  THRR New 91-110-130        Progression   Progression   Continue to progress workloads to maintain intensity without signs/symptoms of physical distress.  Continue to progress workloads to maintain intensity without signs/symptoms of physical distress.        Resistance Training   Training Prescription  Yes  Yes      Weight  4  4      Reps  10-15  10-15        NuStep   Level  3  3      SPM  96  94      Minutes  15  15      METs  2.6  2.6        Arm Ergometer   Level  2.7  2.7      Watts  34  33      Minutes  2  2      METs  3.6  3.5        Home Exercise Plan   Plans to continue exercise at  Home (comment)  Home (comment)      Frequency  Add 2 additional days to program exercise sessions.  Add 2 additional days to program exercise sessions.      Initial Home Exercises Provided  01/18/17  01/18/17         Exercise Comments:  Exercise Comments    Row Name 01/18/17 1237 01/31/17 0750 02/13/17 1438 02/27/17 1456 03/10/17 1419  Exercise Comments  Patient received his take home exercise plan today. He spoke of doing activities around the house. I educated him on THR and safe ways to be active outside of CR. He demonstrated an understanding. I encouraged him to come to me if he had any questions.  Patient is doing well in CR. He was taken off of the treadmill due to knee and joint pain.   Patient is doing well in CR. He is maintaining his levels and his watts on his machines. He has increased his resistance on his dumbbells for his warm up as well. He has been staying active with yard work and [employer] houses  Patient is doing well in CR.  Patient is doing well in CR. He has increased his level on th earm ergometer and has maintained his SPMs on both machines. He states that he feels that the program is helping him.   Row Name 04/11/17 [phone]           Exercise Comments  Patient is doing well in CR. He is maintaining all of his levels and his watts on both of his machines. He has just completed a walk test and is preparing to leave CR.            Exercise Goals and Review:  Exercise Goals    Row Name 01/10/17 1446             Exercise Goals   Increase Physical Activity  Yes       Intervention  Provide advice, education, support and counseling about physical activity/exercise needs.;Develop an individualized exercise prescription for aerobic and resistive training based on initial evaluation findings, risk stratification, comorbidities and participant's personal goals.       Expected Outcomes  Achievement of increased cardiorespiratory fitness and enhanced flexibility, muscular endurance and strength shown through measurements of functional capacity and personal statement of participant.       Increase Strength and Stamina  Yes       Intervention  Provide advice, education, support and counseling about physical activity/exercise needs.;Develop an individualized exercise prescription for aerobic and resistive training based on initial evaluation findings, risk stratification, comorbidities and participant's personal goals.       Expected Outcomes  Achievement of increased cardiorespiratory fitness and enhanced flexibility, muscular endurance and strength shown through measurements of functional capacity and personal statement of participant.       Able to understand and use rate of perceived exertion (RPE) scale  Yes       Intervention  Provide education and explanation on how to use RPE scale       Expected Outcomes  Short Term: Able to use RPE daily in rehab to express subjective intensity level;Long Term:  Able to use RPE to guide intensity level when exercising independently       Able to understand and use Dyspnea scale  Yes       Intervention  Provide education and explanation on how to use Dyspnea scale       Expected Outcomes  Short Term: Able to use Dyspnea scale daily in rehab to express subjective sense of shortness of breath during exertion;Long Term: Able to use Dyspnea scale to guide intensity level when exercising  independently       Knowledge and understanding of Target Heart Rate Range (THRR)  Yes       Intervention  Provide education and explanation of THRR including how the numbers were predicted and where they are located  for reference       Expected Outcomes  Short Term: Able to use daily as guideline for intensity in rehab;Long Term: Able to use THRR to govern intensity when exercising independently       Able to check pulse independently  Yes       Intervention  Review the importance of being able to check your own pulse for safety during independent exercise       Expected Outcomes  Short Term: Able to explain why pulse checking is important during independent exercise;Long Term: Able to check pulse independently and accurately       Understanding of Exercise Prescription  Yes       Intervention  Provide education, explanation, and written materials on patient's individual exercise prescription       Expected Outcomes  Short Term: Able to explain program exercise prescription;Long Term: Able to explain home exercise prescription to exercise independently          Exercise Goals Re-Evaluation : Exercise Goals Re-Evaluation    Row Name 01/31/17 0749 02/13/17 1436 03/10/17 1418 04/11/17 0822       Exercise Goal Re-Evaluation   Exercise Goals Review  Increase Physical Activity;Increase Strength and Stamina;Knowledge and understanding of Target Heart Rate Range (THRR)  Increase Physical Activity;Increase Strength and Stamina;Knowledge and understanding of Target Heart Rate Range (THRR)  Increase Strength and Stamina;Increase Physical Activity;Knowledge and understanding of Target Heart Rate Range (THRR)  Increase Physical Activity;Increase Strength and Stamina;Knowledge and understanding of Target Heart Rate Range (THRR)    Comments  Patient is doing well in CR. He was taken off of the treadmill due to knee and joint pain.   Patient is doing well in CR. He is maintaining his levels and his watts on his  machines. He has increased his resistance on his dumbbells for his warm up as well. 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  Patient is doing well in CR. He has increased his level on th earm ergometer and has maintained his SPMs on both machines. He states that he feels that the program is helping him.   Patient is doing well in CR. He is maintaining all of his levels and his watts on both of his machines. He has just completed a walk test and is preparing to leave CR.      Expected Outcomes  Patient wishes to gain strength and stamina throughout the program.   Patient wants to get stronger and to gain stamina   Patient wants to get stronger and to gain more stamina   Patient wishes to get stronger and to gain stamina,         Discharge Exercise Prescription (Final Exercise Prescription Changes): Exercise Prescription Changes - 04/11/17 0800      Response to Exercise   Blood Pressure (Admit)  160/60    Blood Pressure (Exercise)  168/64    Blood Pressure (Exit)  144/58    Heart Rate (Admit)  51 bpm    Heart Rate (Exercise)  75 bpm    Heart Rate (Exit)  61 bpm    Rating of Perceived Exertion (Exercise)  11    Duration  Progress to 30 minutes of  aerobic without signs/symptoms of physical distress    Intensity  THRR New [phone]      Progression   Progression  Continue to progress workloads to maintain intensity without signs/symptoms of physical distress.      Resistance Training  Training Prescription  Yes    Weight  4    Reps  10-15      NuStep   Level  3    SPM  94    Minutes  15    METs  2.6      Arm Ergometer   Level  2.7    Watts  33    Minutes  2    METs  3.5      Home Exercise Plan   Plans to continue exercise at  Home (comment)    Frequency  Add 2 additional days to program exercise sessions.    Initial Home Exercises Provided  01/18/17       Nutrition:  Target Goals: Understanding of nutrition guidelines, daily intake of sodium  <1575m, cholesterol <2089m calories 30% from fat and 7% or less from saturated fats, daily to have 5 or more servings of fruits and vegetables.  Biometrics: Pre Biometrics - 01/10/17 1253      Pre Biometrics   Height  _0  (1.753 m)    Waist Circumference  39 inches    Hip Circumference  40 inches    Waist to Hip Ratio  0.98 %    Triceps Skinfold  13 mm    % Body Fat  27.8 %    Grip Strength  71.2 kg    Flexibility  0 in    Single Leg Stand  4 seconds      Post Biometrics - 04/26/17 1404       Post  Biometrics   Height  _1  (1.753 m)    Weight  229 lb 7.6 oz (104.1 kg)    Waist Circumference  42 inches    Hip Circumference  41 inches    Waist to Hip Ratio  1.02 %    BMI (Calculated)  33.87    Triceps Skinfold  12 mm    % Body Fat  29.8 %    Grip Strength  77.6 kg    Flexibility  0 in    Single Leg Stand  4 seconds       Nutrition Therapy Plan and Nutrition Goals: Nutrition Therapy & Goals - 01/19/17 1314      Personal Nutrition Goals   Nutrition Goal  For heart healthy choices add >50% of whole grains, make half their plate fruits and vegetables. Discuss the difference between starchy vegetables and leafy greens, and how leafy vegetables provide fiber, helps maintain healthy weight, helps control blood glucose, and lowers cholesterol.  Discuss purchasing fresh or frozen vegetable to reduce sodium and not to add grease, fat or sugar. Consume <18oz of red meat per week. Consume lean cuts of meats and very little of meats high in sodium and nitrates such as pork and lunch meats. Discussed portion control for all food groups.      Additional Goals?  No      Intervention Plan   Intervention  Prescribe, educate and counsel regarding individualized specific dietary modifications aiming towards targeted core components such as weight, hypertension, lipid management, diabetes, heart failure and other comorbidities.;Nutrition handout(s) given to patient.    Expected Outcomes   Short Term Goal: Understand basic principles of dietary content, such as calories, fat, sodium, cholesterol and nutrients.;Short Term Goal: A plan has been developed with personal nutrition goals set during dietitian appointment.;Long Term Goal: Adherence to prescribed nutrition plan.       Nutrition Discharge: Rate Your Plate Scores: Nutrition Assessments - 04/27/17 1611  MEDFICTS Scores   Pre Score  27    Post Score  2    Score Difference  -25       Nutrition Goals Re-Evaluation: Nutrition Goals Re-Evaluation    Row Name 02/16/17 0907 03/16/17 1446 04/14/17 1304         Goals   Current Weight  220 lb (99.8 kg)  224 lb 8 oz (101.8 kg)  228 lb 9.6 oz (103.7 kg)     Nutrition Goal  Plans to explore mediterranean diet prescribed by his MD. For heart healthy choices add >50% of whole grains, make half their plate fruits and vegetables. Discuss the difference between starchy vegetables and leafy greens, and how leafy vegetables provide fiber, helps maintain healthy weight, helps control blood glucose, and lowers cholesterol.  Discuss purchasing fresh or frozen vegetable to reduce sodium and not to add grease, fat or sugar. Consume <18oz of red meat per week. Consume lean cuts of meats and very little of meats high in sodium and nitrates such as pork and lunch meats. Discussed portion control for all food groups.    For heart healthy choices add >50% of whole grains, make half their plate fruits and vegetables. Discuss the difference between starchy vegetables and leafy greens, and how leafy vegetables provide fiber, helps maintain healthy weight, helps control blood glucose, and lowers cholesterol.  Discuss purchasing fresh or frozen vegetable to reduce sodium and not to add grease, fat or sugar. Consume <18oz of red meat per week. Consume lean cuts of meats and very little of meats high in sodium and nitrates such as pork and lunch meats. Discussed portion control for all food groups.  For  heart healthy choices add >50% of whole grains, make half their plate fruits and vegetables. Discuss the difference between starchy vegetables and leafy greens, and how leafy vegetables provide fiber, helps maintain healthy weight, helps control blood glucose, and lowers cholesterol.  Discuss purchasing fresh or frozen vegetable to reduce sodium and not to add grease, fat or sugar. Consume <18oz of red meat per week. Consume lean cuts of meats and very little of meats high in sodium and nitrates such as pork and lunch meats. Discussed portion control for all food groups.       Comment  Patient has steadily been gaining weight since he started which he blames on smoking cessation 5 months ago. He says he is trying to eat less red meats and more vegatables. Patient said at his age, he did not see the point of trying to change his diet alot. Tried to encourage patient to make an effort to change his lifestyle and make healthy diet choices if nothing else to feel better and have more energy. Will continue to monitor.   Patient has completed 21 sessions gaining 2 lbs since last 30 day review and 11.4 lbs overall. His cardiologist has made some adjustments in his diuretic. Patient says he does not plan on changing his diet at this point in his life. Will continue to monitor.   Patient has gained 4 lbs since last 30 day review. Patient continues to express no interest in changing his diet. Will continue to monitor for progress.      Expected Outcome  Patient will begin to work toward meeting the nutritional goals.   Patient will maintain his current weight.   Patient will hopefully begin to express interest in eating heart healthy.         Nutrition Goals Discharge (Final Nutrition Goals  Re-Evaluation): Nutrition Goals Re-Evaluation - 04/14/17 1304      Goals   Current Weight  228 lb 9.6 oz (103.7 kg)    Nutrition Goal  For heart healthy choices add >50% of whole grains, make half their plate fruits and  vegetables. Discuss the difference between starchy vegetables and leafy greens, and how leafy vegetables provide fiber, helps maintain healthy weight, helps control blood glucose, and lowers cholesterol.  Discuss purchasing fresh or frozen vegetable to reduce sodium and not to add grease, fat or sugar. Consume <18oz of red meat per week. Consume lean cuts of meats and very little of meats high in sodium and nitrates such as pork and lunch meats. Discussed portion control for all food groups.      Comment  Patient has gained 4 lbs since last 30 day review. Patient continues to express no interest in changing his diet. Will continue to monitor for progress.     Expected Outcome  Patient will hopefully begin to express interest in eating heart healthy.        Psychosocial: Target Goals: Acknowledge presence or absence of significant depression and/or stress, maximize coping skills, provide positive support system. Participant is able to verbalize types and ability to use techniques and skills needed for reducing stress and depression.  Initial Review & Psychosocial Screening: Initial Psych Review & Screening - 01/10/17 1450      Initial Review   Current issues with  None Identified      Family Dynamics   Good Support System?  Yes      Barriers   Psychosocial barriers to participate in program  There are no identifiable barriers or psychosocial needs.      Screening Interventions   Interventions  Encouraged to exercise       Quality of Life Scores: Quality of Life - 04/26/17 1405      Quality of Life Scores   Health/Function Pre  16.23 %    Health/Function Post  18.03 %    Health/Function % Change  11.09 %    Socioeconomic Pre  19.21 %    Socioeconomic Post  19.14 %    Socioeconomic % Change   -0.36 %    Psych/Spiritual Pre  18.64 %    Psych/Spiritual Post  17.29 %    Psych/Spiritual % Change  -7.24 %    Family Pre  27.1 %    Family Post  24.6 %    Family % Change  -9.23 %     GLOBAL Pre  18.94 %    GLOBAL Post  19.07 %    GLOBAL % Change  0.69 %       PHQ-9: Recent Review Flowsheet Data    Depression screen [facility] 2/9 04/27/2017 01/10/2017   Decreased Interest 0 0   Down, Depressed, Hopeless 0 0   PHQ - 2 Score 0 0   Altered sleeping 0 0   Tired, decreased energy 1 1   Change in appetite 2 0   Feeling bad or failure about yourself  0 0   Trouble concentrating 0 0   Moving slowly or fidgety/restless 0 0   Suicidal thoughts 0 0   PHQ-9 Score 3 1   Difficult doing work/chores Not difficult at all Somewhat difficult     Interpretation of Total Score  Total Score Depression Severity:  1-4 = Minimal depression, 5-9 = Mild depression, 10-14 = Moderate depression, 15-19 = Moderately severe depression, 20-27 = Severe depression   Psychosocial  Evaluation and Intervention: Psychosocial Evaluation - 04/27/17 1613      Discharge Psychosocial Assessment & Intervention   Comments  Patient has no psychosocial barriers identified at discharge. His exit QOL score imporved by 0.69% at 19.07% and his exit PHQ-9 score was 3. He continues to functionally grieve the loss of his son last year.        Psychosocial Re-Evaluation: Psychosocial Re-Evaluation    [place] Name 02/16/17 0915 03/16/17 1459 04/14/17 1318         Psychosocial Re-Evaluation   Current issues with  None Identified  None Identified  None Identified     Comments  Patient initial QOL score was 23.85 and his PHQ-9 score was 1. Patient shared during the Family matters class with the chaplian that he had lost his son several months ago but he was working through his grief. Will continue to monitor.   Patient continues to work through his grief over his son. Will continue to monitor.   -     Expected Outcomes  Patient will have no psychosocial barriers identified at discharge.   Patient will have no psychosocial barriers identified at discharge.   Patient will have no psychosocial barriers identified at  discharge.      Interventions  Encouraged to attend Cardiac Rehabilitation for the exercise;Stress management education;Relaxation education  Encouraged to attend Cardiac Rehabilitation for the exercise;Relaxation education;Stress management education  Stress management education;Encouraged to attend Cardiac Rehabilitation for the exercise;Relaxation education     Continue Psychosocial Services   No Follow up required  No Follow up required  No Follow up required        Psychosocial Discharge (Final Psychosocial Re-Evaluation): Psychosocial Re-Evaluation - 04/14/17 1318      Psychosocial Re-Evaluation   Current issues with  None Identified    Expected Outcomes  Patient will have no psychosocial barriers identified at discharge.     Interventions  Stress management education;Encouraged to attend Cardiac Rehabilitation for the exercise;Relaxation education    Continue Psychosocial Services   No Follow up required       Vocational Rehabilitation: Provide vocational rehab assistance to qualifying candidates.   Vocational Rehab Evaluation & Intervention: Vocational Rehab - 01/10/17 1444      Initial Vocational Rehab Evaluation & Intervention   Assessment shows need for Vocational Rehabilitation  No       Education: Education Goals: Education classes will be provided on a weekly basis, covering required topics. Participant will state understanding/return demonstration of topics presented.  Learning Barriers/Preferences: Learning Barriers/Preferences - 01/10/17 1444      Learning Barriers/Preferences   Learning Barriers  Hearing    Learning Preferences  Skilled Demonstration;Individual Instruction;Group Instruction       Education Topics: Hypertension, Hypertension Reduction -Define heart disease and high blood pressure. Discus how high blood pressure affects the body and ways to reduce high blood pressure.   CARDIAC REHAB PHASE II EXERCISE from 04/19/2017 in [place]  Date  03/22/17  Educator  Natalia  Instruction Review Code  2- Demonstrated Understanding      Exercise and Your Heart -Discuss why it is important to exercise, the FITT principles of exercise, normal and abnormal responses to exercise, and how to exercise safely.   CARDIAC REHAB PHASE II EXERCISE from 04/19/2017 in [place]  Date  03/29/17  Educator  DJ  Instruction Review Code  2- Demonstrated Understanding      Angina -Discuss definition of angina, causes of angina, treatment of  angina, and how to decrease risk of having angina.   CARDIAC REHAB PHASE II EXERCISE from 04/19/2017 in [place]  Date  04/05/17  Educator  D. Coad  Instruction Review Code  2- Demonstrated Understanding      Cardiac Medications -Review what the following cardiac medications are used for, how they affect the body, and side effects that may occur when taking the medications.  Medications include Aspirin, Beta blockers, calcium channel blockers, ACE Inhibitors, angiotensin receptor blockers, diuretics, digoxin, and antihyperlipidemics.   CARDIAC REHAB PHASE II EXERCISE from 04/19/2017 in [place]  Date  04/12/17  Educator  DJ  Instruction Review Code  2- Demonstrated Understanding      Congestive Heart Failure -Discuss the definition of CHF, how to live with CHF, the signs and symptoms of CHF, and how keep track of weight and sodium intake.   CARDIAC REHAB PHASE II EXERCISE from 04/19/2017 in [place]  Date  04/19/17  Educator  DC  Instruction Review Code  2- Demonstrated Understanding      Heart Disease and Intimacy -Discus the effect sexual activity has on the heart, how changes occur during intimacy as we age, and safety during sexual activity.   CARDIAC REHAB PHASE II EXERCISE from 04/19/2017 in [place]  Date  01/18/17  Educator  DC  Instruction Review Code   2- Demonstrated Understanding      Smoking Cessation / COPD -Discuss different methods to quit smoking, the health benefits of quitting smoking, and the definition of COPD.   CARDIAC REHAB PHASE II EXERCISE from 04/19/2017 in [place]  Date  01/25/17  Educator  DJ  Instruction Review Code  2- Demonstrated Understanding      Nutrition I: Fats -Discuss the types of cholesterol, what cholesterol does to the heart, and how cholesterol levels can be controlled.   CARDIAC REHAB PHASE II EXERCISE from 04/19/2017 in [place]  Date  02/01/17  Educator  DC  Instruction Review Code  2- Demonstrated Understanding      Nutrition II: Labels -Discuss the different components of food labels and how to read food label   Heart Parts and Heart Disease -Discuss the anatomy of the heart, the pathway of blood circulation through the heart, and these are affected by heart disease.   CARDIAC REHAB PHASE II EXERCISE from 04/19/2017 in [place]  Date  02/15/17  Educator  Dj  Instruction Review Code  2- Demonstrated Understanding      Stress I: Signs and Symptoms -Discuss the causes of stress, how stress may lead to anxiety and depression, and ways to limit stress.   CARDIAC REHAB PHASE II EXERCISE from 04/19/2017 in [place]  Date  02/22/17  Educator  DC  Instruction Review Code  2- Demonstrated Understanding      Stress II: Relaxation -Discuss different types of relaxation techniques to limit stress.   CARDIAC REHAB PHASE II EXERCISE from 04/19/2017 in [place]  Date  03/01/17  Educator  DJ  Instruction Review Code  2- Demonstrated Understanding      Warning Signs of Stroke / TIA -Discuss definition of a stroke, what the signs and symptoms are of a stroke, and how to identify when someone is having stroke.   CARDIAC REHAB PHASE II EXERCISE from 04/19/2017 in [place]  Date  03/08/17  Educator  [place]  Instruction Review  Code  2- Demonstrated Understanding      Knowledge Questionnaire Score: Knowledge Questionnaire Score - 04/27/17 1611      Knowledge Questionnaire Score   Pre Score  21/24    Post Score  21/24       Core Components/Risk Factors/Patient Goals at Admission: Personal Goals and Risk Factors at Admission - 01/10/17 1449      Core Components/Risk Factors/Patient Goals on Admission    Weight Management  Obesity    Personal Goal Other  Yes    Personal Goal  Gain streanth and stamina    Intervention  Attend CR 3 x week and supplement with home exercise 2 x week    Expected Outcomes  Reach personal goals.        Core Components/Risk Factors/Patient Goals Review:  Goals and Risk Factor Review    Row Name 01/10/17 1449 02/16/17 0912 03/16/17 1449 04/14/17 1308 04/27/17 1615     Core Components/Risk Factors/Patient Goals Review   Personal Goals Review  Weight Management/Obesity;Lipids  Weight Management/Obesity Get stronger; gain stamina.   Weight Management/Obesity Get stronger; gain stamina.  Weight Management/Obesity Get stronger; gain stamina.   Weight Management/Obesity Get stronger; gain stamina.    Review  -  Patient has completed 13 sessions gaining 7 lbs. Patient is doing well in the program. He says he feels somewhat stronger since he started. He stopped smoking 5 months ago and remains tobacco free. Will continue to monitor.   Patient has completed 21 sessions. He has not attended in past week due to new onset chest pain. He is also hypertensive. A nuclear stress test was done 03/14/17. Cardiologist increased his Metoprolol dosage. He was okayed to return to CR. Patient has been progressing well in the program stating he does have more energy and stamina and he believes the program is helping him. Will continue to monitor.   Patient has completed 33 sessions gaining 4 lbs since last 30 day review. He continues to  do well in the program with progression. He says he is getting stronger and he feels the program is helping him. Will continue to monitor for progress.   Patient graduated completing 36 sessions gaining 15.9 lbs. He attributes some of the weight gain to fluid. His medficts score improved by 25%. He says the program helped him meet is goal. He does have more energy and improved endurance. His exit measurements improved in grip strength and his exit walk test improved by 13.04%. He says he feels better overall and plans to continue exercising by walking at home.    Expected Outcomes  -  Patient will complete the program meeting his personal goals.   Patient will complete the program and continue to meet his personal goals.   Patient will complete the program and continue to meet her personal goals.   Patient will continue to exercise at home by walking and continue to meet his goal of more energy and endurance and continue to work toward meeting his weight loss goal. CR will follow up for one year.       Core Components/Risk Factors/Patient Goals at Discharge (Final Review):  Goals and Risk Factor Review - 04/27/17 1615      Core Components/Risk Factors/Patient Goals Review   Personal Goals Review  Weight Management/Obesity Get stronger; gain stamina.     Review  Patient graduated completing 36 sessions gaining 15.9 lbs. He attributes some of the weight gain to fluid. His medficts score improved by 25%. He says the  program helped him meet is goal. He does have more energy and improved endurance. His exit measurements improved in grip strength and his exit walk test improved by 13.04%. He says he feels better overall and plans to continue exercising by walking at home.     Expected Outcomes  Patient will continue to exercise at home by walking and continue to meet his goal of more energy and endurance and continue to work toward meeting his weight loss goal. CR will follow up for one year.        ITP  Comments: ITP Comments    Row Name 01/18/17 0742 01/19/17 1314         ITP Comments  Patient new to program. Plans to start 01/18/17.   Patient attended Family Matters class with hospital chaplin to discuss how his recent diagnosis has effected his life.          Comments: Patient graduated from [facility] today on 04/21/2017 after completing 36 sessions. He achieved LTG of 30 minutes of aerobic exercise at Max Met level of 3.5. All patients vitals are WNL. Patient has met with dietician. Discharge instruction has been reviewed in detail and patient stated an understanding of material given. Patient plans to exercise at home by walking. Cardiac Rehab staff will make f/u calls at 1 month, 6 months, and 1 year. Patient had no complaints of any abnormal S/S or pain on their exit visit.

## 2017-06-14 ENCOUNTER — Other Ambulatory Visit: Payer: Self-pay | Admitting: Cardiology

## 2017-06-15 ENCOUNTER — Telehealth: Payer: Self-pay | Admitting: Cardiology

## 2017-06-15 NOTE — Telephone Encounter (Signed)
6 weeks  Mark Abts MD

## 2017-06-15 NOTE — Telephone Encounter (Signed)
Patient walked in asking when he should expect his next visit to be.   Last office note stated f/u based on test results

## 2017-06-15 NOTE — Telephone Encounter (Signed)
Patient notified.  OV scheduled for 08/21/2017 at 1:00 with Dr. Harl Bowie [facility] office.

## 2017-06-15 NOTE — Telephone Encounter (Signed)
Please advise when you want to see patient back for follow up.

## 2017-06-28 ENCOUNTER — Inpatient Hospital Stay
Admission: AD | Admit: 2017-06-28 | Payer: Self-pay | Source: Other Acute Inpatient Hospital | Admitting: Cardiovascular Disease

## 2017-06-28 DIAGNOSIS — K219 Gastro-esophageal reflux disease without esophagitis: Secondary | ICD-10-CM | POA: Diagnosis not present

## 2017-06-28 DIAGNOSIS — I252 Old myocardial infarction: Secondary | ICD-10-CM | POA: Diagnosis not present

## 2017-06-28 DIAGNOSIS — D631 Anemia in chronic kidney disease: Secondary | ICD-10-CM | POA: Diagnosis not present

## 2017-06-28 DIAGNOSIS — I214 Non-ST elevation (NSTEMI) myocardial infarction: Secondary | ICD-10-CM | POA: Diagnosis not present

## 2017-06-28 DIAGNOSIS — I10 Essential (primary) hypertension: Secondary | ICD-10-CM | POA: Diagnosis not present

## 2017-06-28 DIAGNOSIS — R7989 Other specified abnormal findings of blood chemistry: Secondary | ICD-10-CM | POA: Diagnosis not present

## 2017-06-28 DIAGNOSIS — I5033 Acute on chronic diastolic (congestive) heart failure: Secondary | ICD-10-CM | POA: Diagnosis not present

## 2017-06-28 DIAGNOSIS — R079 Chest pain, unspecified: Secondary | ICD-10-CM | POA: Diagnosis not present

## 2017-06-28 DIAGNOSIS — E7800 Pure hypercholesterolemia, unspecified: Secondary | ICD-10-CM | POA: Diagnosis not present

## 2017-06-28 DIAGNOSIS — Z955 Presence of coronary angioplasty implant and graft: Secondary | ICD-10-CM | POA: Diagnosis not present

## 2017-06-28 DIAGNOSIS — I700 Atherosclerosis of aorta: Secondary | ICD-10-CM | POA: Diagnosis not present

## 2017-06-28 DIAGNOSIS — Z87891 Personal history of nicotine dependence: Secondary | ICD-10-CM | POA: Diagnosis not present

## 2017-06-28 DIAGNOSIS — I130 Hypertensive heart and chronic kidney disease with heart failure and stage 1 through stage 4 chronic kidney disease, or unspecified chronic kidney disease: Secondary | ICD-10-CM | POA: Diagnosis not present

## 2017-06-28 DIAGNOSIS — T508X5A Adverse effect of diagnostic agents, initial encounter: Secondary | ICD-10-CM | POA: Diagnosis not present

## 2017-06-28 DIAGNOSIS — I058 Other rheumatic mitral valve diseases: Secondary | ICD-10-CM | POA: Diagnosis not present

## 2017-06-28 DIAGNOSIS — D649 Anemia, unspecified: Secondary | ICD-10-CM | POA: Diagnosis not present

## 2017-06-28 DIAGNOSIS — N189 Chronic kidney disease, unspecified: Secondary | ICD-10-CM | POA: Diagnosis not present

## 2017-06-28 DIAGNOSIS — R748 Abnormal levels of other serum enzymes: Secondary | ICD-10-CM | POA: Diagnosis not present

## 2017-06-28 DIAGNOSIS — E039 Hypothyroidism, unspecified: Secondary | ICD-10-CM | POA: Diagnosis not present

## 2017-06-28 DIAGNOSIS — I517 Cardiomegaly: Secondary | ICD-10-CM | POA: Diagnosis not present

## 2017-06-28 DIAGNOSIS — Z951 Presence of aortocoronary bypass graft: Secondary | ICD-10-CM | POA: Diagnosis not present

## 2017-06-28 DIAGNOSIS — I499 Cardiac arrhythmia, unspecified: Secondary | ICD-10-CM | POA: Diagnosis not present

## 2017-06-28 DIAGNOSIS — Z79899 Other long term (current) drug therapy: Secondary | ICD-10-CM | POA: Diagnosis not present

## 2017-06-28 DIAGNOSIS — I5030 Unspecified diastolic (congestive) heart failure: Secondary | ICD-10-CM | POA: Diagnosis not present

## 2017-06-28 DIAGNOSIS — R918 Other nonspecific abnormal finding of lung field: Secondary | ICD-10-CM | POA: Diagnosis not present

## 2017-06-28 DIAGNOSIS — M79602 Pain in left arm: Secondary | ICD-10-CM | POA: Diagnosis not present

## 2017-06-28 DIAGNOSIS — R0789 Other chest pain: Secondary | ICD-10-CM | POA: Diagnosis not present

## 2017-06-28 DIAGNOSIS — I25729 Atherosclerosis of autologous artery coronary artery bypass graft(s) with unspecified angina pectoris: Secondary | ICD-10-CM | POA: Diagnosis not present

## 2017-06-28 DIAGNOSIS — Z8673 Personal history of transient ischemic attack (TIA), and cerebral infarction without residual deficits: Secondary | ICD-10-CM | POA: Diagnosis not present

## 2017-06-28 DIAGNOSIS — I2510 Atherosclerotic heart disease of native coronary artery without angina pectoris: Secondary | ICD-10-CM | POA: Diagnosis not present

## 2017-06-28 DIAGNOSIS — R0600 Dyspnea, unspecified: Secondary | ICD-10-CM | POA: Diagnosis not present

## 2017-06-28 DIAGNOSIS — Z7982 Long term (current) use of aspirin: Secondary | ICD-10-CM | POA: Diagnosis not present

## 2017-06-28 DIAGNOSIS — I361 Nonrheumatic tricuspid (valve) insufficiency: Secondary | ICD-10-CM | POA: Diagnosis not present

## 2017-06-28 DIAGNOSIS — N170 Acute kidney failure with tubular necrosis: Secondary | ICD-10-CM | POA: Diagnosis not present

## 2017-06-28 DIAGNOSIS — Z7902 Long term (current) use of antithrombotics/antiplatelets: Secondary | ICD-10-CM | POA: Diagnosis not present

## 2017-06-28 DIAGNOSIS — I25810 Atherosclerosis of coronary artery bypass graft(s) without angina pectoris: Secondary | ICD-10-CM | POA: Diagnosis not present

## 2017-06-28 DIAGNOSIS — N179 Acute kidney failure, unspecified: Secondary | ICD-10-CM | POA: Diagnosis not present

## 2017-06-28 DIAGNOSIS — R0602 Shortness of breath: Secondary | ICD-10-CM | POA: Diagnosis not present

## 2017-06-28 DIAGNOSIS — I21A9 Other myocardial infarction type: Secondary | ICD-10-CM | POA: Diagnosis not present

## 2017-06-28 DIAGNOSIS — R944 Abnormal results of kidney function studies: Secondary | ICD-10-CM | POA: Diagnosis not present

## 2017-06-28 DIAGNOSIS — I272 Pulmonary hypertension, unspecified: Secondary | ICD-10-CM | POA: Diagnosis not present

## 2017-06-28 DIAGNOSIS — Z888 Allergy status to other drugs, medicaments and biological substances status: Secondary | ICD-10-CM | POA: Diagnosis not present

## 2017-06-28 DIAGNOSIS — T82218A Other mechanical complication of coronary artery bypass graft, initial encounter: Secondary | ICD-10-CM | POA: Diagnosis not present

## 2017-06-28 DIAGNOSIS — Z8551 Personal history of malignant neoplasm of bladder: Secondary | ICD-10-CM | POA: Diagnosis not present

## 2017-06-28 DIAGNOSIS — I351 Nonrheumatic aortic (valve) insufficiency: Secondary | ICD-10-CM | POA: Diagnosis not present

## 2017-06-29 DIAGNOSIS — I25810 Atherosclerosis of coronary artery bypass graft(s) without angina pectoris: Secondary | ICD-10-CM | POA: Insufficient documentation

## 2017-06-30 DIAGNOSIS — I5030 Unspecified diastolic (congestive) heart failure: Secondary | ICD-10-CM | POA: Diagnosis present

## 2017-07-04 ENCOUNTER — Encounter: Payer: Self-pay | Admitting: Cardiology

## 2017-07-04 ENCOUNTER — Other Ambulatory Visit: Payer: Self-pay

## 2017-07-04 ENCOUNTER — Ambulatory Visit (INDEPENDENT_AMBULATORY_CARE_PROVIDER_SITE_OTHER): Payer: Medicare Other | Admitting: Cardiology

## 2017-07-04 ENCOUNTER — Encounter: Payer: Self-pay | Admitting: *Deleted

## 2017-07-04 VITALS — BP 177/67 | HR 57 | Ht 69.0 in | Wt 241.0 lb

## 2017-07-04 DIAGNOSIS — I5032 Chronic diastolic (congestive) heart failure: Secondary | ICD-10-CM | POA: Diagnosis not present

## 2017-07-04 DIAGNOSIS — N179 Acute kidney failure, unspecified: Secondary | ICD-10-CM

## 2017-07-04 DIAGNOSIS — I2510 Atherosclerotic heart disease of native coronary artery without angina pectoris: Secondary | ICD-10-CM | POA: Diagnosis not present

## 2017-07-04 DIAGNOSIS — I10 Essential (primary) hypertension: Secondary | ICD-10-CM

## 2017-07-04 NOTE — Progress Notes (Signed)
Clinical Summary [name] is a 72 y.o.male  seen today for follow up of the following medical problems.   1. Chronic diastolic HF - since discharge no recent chest pain. Significant progression of LE edema and SOB over the last few days. WEight up roughly 10 lbs.  - he reports this is the worst swelling he has ever had - has been compliant with diuretic. Taking 40mg  daily has not helped.  - of note he was transiently on amiodarone postop recent CABG, needs TSH check in setting of leg edema.  - swelling did not improve with lower dose of norvasc. Had been on norvasc before without troubles, with this degree of weight gain does not appear to be the issue.     - no recent LE edema. Has had some SOB with activities since his recent MI   2. HTN - meds changed during recent admission with CABG - norvasc 10, HCTZ 25, lisionpril 40mg  was stopped during that admission - 10/26/16 we restarted norvasc 10mg , and later lisionpril 10mg  daily.  - he developed some swelling, and we lowered his norvasc to 5mg  daily and started lasix 20mg  prn, and then 40mg  prn.  - reports 7 lbs weight gain.   - compliant with meds. Home SBPs in 160s     3. CAD - hx of CABG in Jan 2008 (LIMA-LAD, SVG-D2, SVG-OM, SVG-RPL) - admit [facility]/14 [name] with NSTEMI, received DES to SVG-RCA. No LV gram.  - 08/2014 echo from [place] LVEF 60-65%  - had nuclear stress 2 at [facility]. Large moderate lateral defect fixed. 11/2015.  -cath at [facility] 03/18/16. LM 20%, prox LAD 100% CTO, D2 diffuse disease, LCX LIs, OM2 80%, RCA right PL segment 90%. LIMA patent, SVG-OM2 patent, SVG-D2 50% stenosis, SVG-right PL occluded. Recs were for medical management. Fairly stable findings from prior cath however SVG-RCA PL which was previously stented is now occluded.    - admit 09/2016 with NSTEMI, peak troponin 0.43. Cath as reported below. He was referred for repeat CABG - CABG 09/19/16 with RIMA-diag, left radial to distal  RCA - echo 09/2016 LVEF 50-55% - has not been on ACE-I due to poor renal function  02/2017 nuclear stress: inferior/inferolateral infarct with mild to moderate ischemia.  - Admitted 06/2017 with NSTEMI to [facility]. Peak trop 10.  06/29/17 cath: chronic occluded LAD, 80% prox RCA Patent LIMA-LAD, LRA graft to distal RCA, SVG-OM/D1. Occluded RIMA graft, diffuse plaque and acute on chronic occlusion of the SVG-D2 thought culprit poor PCI target. Chronic occlusion of SVG-rPL - echo 06/2017 [facility] LVEF 65-70%, grade II diastolicdysfunction - post cath AKI with Cr to 2. Thought due to cath dye, diuresis due to CHF.   - no chest pain since discharge. Ongoing SOB - no recent edema. Had been on lasix 60/40, off since discharge. Stopped lisinopril and norvasc also during that admission due to soft bp's.      Past Medical History:  Diagnosis Date  . Acute myocardial infarction of other inferior wall, initial episode of care   . Anginal pain ([place])   . Anxiety   . Arthritis   . Cancer [facility])    bladder 2013 removed, has cystoscopy 10/2016, has returned x 2  . COPD (chronic obstructive pulmonary disease) ([place])   . Coronary artery disease    Artery bypass graft Jan 2008  . Dyspnea   . Dysrhythmia   . GERD (gastroesophageal reflux disease)   . Heart murmur   . History  of hiatal hernia   . Hyperlipidemia   . Hypertension   . Hypothyroidism   . PONV (postoperative nausea and vomiting)   . Sleep apnea   . Stroke ([place])   . Tobacco user      Allergies  Allergen Reactions  . Cardizem Cd [Diltiazem Hcl Er Beads] Palpitations    "Makes heart skip"     Current Outpatient Medications  Medication Sig Dispense Refill  . acetaminophen (TYLENOL) 325 MG tablet Take 2 tablets (650 mg total) by mouth every 6 (six) hours as needed for mild pain.    Marland Kitchen amLODipine (NORVASC) 10 MG tablet Take 1 tablet (10 mg total) by mouth daily.    Marland Kitchen aspirin 81 MG chewable tablet Chew 1 tablet (81 mg total) by mouth daily.     Marland Kitchen atorvastatin (LIPITOR) 40 MG tablet Take 1 tablet (40 mg total) by mouth daily. 30 tablet 1  . clopidogrel (PLAVIX) 75 MG tablet Take 1 tablet (75 mg total) by mouth daily. 30 tablet 1  . ergocalciferol (VITAMIN D2) 50000 UNITS capsule Take 50,000 Units by mouth every Sunday.     . furosemide (LASIX) 40 MG tablet Take 40 mg by mouth 2 (two) times daily. WEIGHT >220LBS TAKE 60 AM AND 40 PM UNTIL WEIGHT COMES DOWN THEN RESUME 40 MG BID    . levothyroxine (SYNTHROID, LEVOTHROID) 25 MCG tablet Take 25 mcg by mouth daily before breakfast.    . lisinopril (PRINIVIL,ZESTRIL) 10 MG tablet Take 10 mg by mouth daily.    Marland Kitchen lisinopril (PRINIVIL,ZESTRIL) 10 MG tablet TAKE 1 TABLET BY MOUTH EVERY DAY 30 tablet 6  . magnesium oxide (MAG-OX) 400 MG tablet Take 400 mg by mouth daily.    . metoprolol succinate (TOPROL-XL) 25 MG 24 hr tablet Take 1.5 tablets (37.5 mg total) by mouth daily. 135 tablet 3  . Omega-3 Fatty Acids (RA FISH OIL) 1000 MG CAPS Take 2,000 mg by mouth 2 (two) times daily.     Marland Kitchen omeprazole (PRILOSEC) 20 MG capsule Take 40 mg by mouth daily.     . potassium chloride SA (K-DUR,KLOR-CON) 20 MEQ tablet Take 20 mEq by mouth daily.    . potassium chloride SA (KLOR-CON M20) 20 MEQ tablet TAKE 1 TABLET DAILY 90 tablet 1  . PRESCRIPTION MEDICATION 1 each. Cortisone Injection into each knee every 4 months.     No current facility-administered medications for this visit.      Past Surgical History:  Procedure Laterality Date  . APPENDECTOMY    . CARDIAC CATHETERIZATION    . CORONARY ARTERY BYPASS GRAFT  06/14/2006   x 5  . CORONARY ARTERY BYPASS GRAFT N/A 09/19/2016   Procedure: REDO CORONARY ARTERY BYPASS GRAFTING (CABG) x two, using right internal mammary artery and left radial artery;  Surgeon: [name], MD;  Location: [place];  Service: Open Heart Surgery;  Laterality: N/A;  . FOOT SURGERY    . HERNIA REPAIR    . LEFT HEART CATH AND CORS/GRAFTS ANGIOGRAPHY N/A 09/14/2016   Procedure:  Left Heart Cath and Cors/Grafts Angiography;  Surgeon: Troy Sine, MD;  Location: [place] CV LAB;  Service: Cardiovascular;  Laterality: N/A;  . LEFT HEART CATHETERIZATION WITH CORONARY/GRAFT ANGIOGRAM N/A 01/31/2014   Procedure: LEFT HEART CATHETERIZATION WITH Beatrix Fetters;  Surgeon: Sanda Klein, MD;  Location: [facility] CATH LAB;  Service: Cardiovascular;  Laterality: N/A;  . NOSE SURGERY    . PERCUTANEOUS CORONARY STENT INTERVENTION (PCI-S)  01/31/2014   Procedure: PERCUTANEOUS CORONARY  STENT INTERVENTION (PCI-S);  Surgeon: Sanda Klein, MD;  Location: [facility] CATH LAB;  Service: Cardiovascular;;  . RADIAL ARTERY HARVEST Left 09/19/2016   Procedure: RADIAL ARTERY HARVEST;  Surgeon: [name], MD;  Location: [place];  Service: Open Heart Surgery;  Laterality: Left;  . TEE WITHOUT CARDIOVERSION N/A 09/19/2016   Procedure: TRANSESOPHAGEAL ECHOCARDIOGRAM (TEE);  Surgeon: [name], MD;  Location: [place];  Service: Open Heart Surgery;  Laterality: N/A;     Allergies  Allergen Reactions  . Cardizem Cd [Diltiazem Hcl Er Beads] Palpitations    "Makes heart skip"      Family History  Problem Relation Age of Onset  . Heart attack Mother   . Heart attack Brother      Social History Mr. Fyfe reports that he quit smoking about 9 months ago. His smoking use included cigarettes. He started smoking about 58 years ago. He has a 40.00 pack-year smoking history. he has never used smokeless tobacco. Mr. Steadham reports that he does not drink alcohol.   Review of Systems CONSTITUTIONAL: No weight loss, fever, chills, weakness or fatigue.  HEENT: Eyes: No visual loss, blurred vision, double vision or yellow sclerae.No hearing loss, sneezing, congestion, runny nose or sore throat.  SKIN: No rash or itching.  CARDIOVASCULAR: per hpi RESPIRATORY: per hpi  GASTROINTESTINAL: No anorexia, nausea, vomiting or diarrhea. No abdominal pain or blood.  GENITOURINARY: No burning on  urination, no polyuria NEUROLOGICAL: No headache, dizziness, syncope, paralysis, ataxia, numbness or tingling in the extremities. No change in bowel or bladder control.  MUSCULOSKELETAL: No muscle, back pain, joint pain or stiffness.  LYMPHATICS: No enlarged nodes. No history of splenectomy.  PSYCHIATRIC: No history of depression or anxiety.  ENDOCRINOLOGIC: No reports of sweating, cold or heat intolerance. No polyuria or polydipsia.  Marland Kitchen   Physical Examination Vitals:   07/04/17 1447  BP: (!) 177/67  Pulse: (!) 57  SpO2: 98%   Vitals:   07/04/17 1447  Weight: 241 lb (109.3 kg)  Height: 5\' 9"  (1.753 m)    Gen: resting comfortably, no acute distress HEENT: no scleral icterus, pupils equal round and reactive, no palptable cervical adenopathy,  CV: RRR, no m/r/g, no jvd Resp: Clear to auscultation bilaterally GI: abdomen is soft, non-tender, non-distended, normal bowel sounds, no hepatosplenomegaly MSK: extremities are warm, no edema.  Skin: warm, no rash Neuro:  no focal deficits Psych: appropriate affect   Diagnostic Studies 01/31/14 Cath Angiographic Findings: 1. The left main coronary arteryis not stenotic, but exhibits significant atherosclerosis and bifurcates in the usual fashion into the left anterior descending artery and left circumflex coronary artery.  2. The left anterior descending arteryis a large vessel that reaches the apex and generates several proximal septals, but is occluded before any of the major diagonal branches. There is evidence of extensive luminal irregularities and mild calcification.  3. The left circumflex coronary arteryis a medium-size vessel non dominant vessel that generates two major oblique marginal arteries. There is evidence of extensive luminal irregularities and mild calcification. No hemodynamically meaningful stenoses are seen. The first OM is flush occluded. The distal OM is medium in size.  4. The right coronary arteryis a very  large-size dominant vessel that generates a posterior lateral ventricular artery that is proximally occluded as well as a diffusely diseased PDA. There is evidence of extensive luminal irregularities and mild calcification.  5. The sequential SVG to the diagonal and first OMis very irregular in caliber, but widely patent, without stenoses. The  first diagonal artery is severely diseased and small in caliber. The OM branch is large and free of major stenoses.  6. The SVG to the second diagonalis widely patent and healthy, feeding a relatively large vessel.  7. The LIMA to the LADis widely patent with both retrograde and antegrade LAD flow. There are significant stenoses both upstream and downstream of the anastomosis, but there is excellent flow.  8. The SVG to RCA-PLA has a severe, ulcerated stenosis in its distal third, at least 95% in severity, but with TIMI 3 flow. It is the culprit lesion.  5. The left ventriclewas not injected.There is no aortic valve stenosis by pullback. The left ventricular end-diastolic pressure is 24 mm Hg.   IMPRESSIONS: High grade unstable stenosis in the SVG to PLA causing NSTEMI  RECOMMENDATION: Urgent PCI, preferably with filter wire.   IMPRESSIONS: 1. Successful PCI of the SVG to right coronary artery graft with a 3.0 x 23 Alpine drug-eluting stent - dilated to 3.25 mm in diameter, using a distal embolic protection device, 4.0 spider. 2.         04/2014 Carotid US Moderate bilateral stenosis  12/2011 Abdominal US No aortic aneurysm   Jan 2013 [facility] LVEF 65%      08/2014 [facility] LVEF 60-65%, aortic valve sclerosis.   cath at [facility]/18/16. LM 20%, prox LAD 100% CTO, D2 diffuse disease, LCX LIs, OM2 80%, RCA right PL segment 90%. LIMA patent, SVG-OM2 patent, SVG-D2 50% stenosis, SVG-right PL occluded. Recs were for medical management.  09/2016 cath  The left ventricular ejection fraction is 45-50% by visual  estimate.  LV end diastolic pressure is normal.  There is mild left ventricular systolic dysfunction.  Dist RCA lesion, 90 %stenosed.  SVG.  Dist Graft-1 lesion, 95 %stenosed.  Dist Graft-2 lesion, 50 %stenosed.  Prox Graft lesion, 80 %stenosed.  Ost 2nd Mrg lesion, 100 %stenosed.  SVG.  Mid Graft lesion, 100 %stenosed.  Prox Graft lesion, 100 %stenosed.  Seq SVG-.  Origin lesion, 99 %stenosed.  Prox LAD lesion, 100 %stenosed.  Prox Graft to Dist Graft lesion, 25 %stenosed.  Ost LAD to Prox LAD lesion, 80 %stenosed.  Multivessel native CAD with diffuse 80% proximal LAD stenosis and occlusion of the LAD after a small first diagonal vessel; occluded marginal branch arising from the mid AV groove circumflex with an otherwise patent AV groove circumflex extending distally; RCA with 20% mid narrowing normal PDA, but with 90% PLA stenosis.  Patent LIMA graft supplying the mid LAD.  SVG sequentially supplying the diagonal-1 and OM vessel with 99% near ostial graft stenosis and diffuse luminal irregularity in the body of the graft prior to the initial anastomosis.  SVG supplying the diagonal 2 vessel with diffuse proximal to mid graft stenosis, 95% ulcerated plaque stenosis in the distal third of the graft and 50% eccentric stenosis prior to its anastomosis.  SVG which had previously supplied the PLA and had been stented, is occluded proximally.  Mild-to-moderate LV dysfunction with mid distal anterolateral hypocontractility and an ejection fraction of 45-50%.  RECOMMENDATION: The patient had previously undergone CABG surgery by Dr. Cyndia Bent in 2008. Dr. Cyndia Bent will be re-consulted for evaluation and consideration of possible redo CABG revascularization surgery.   09/2016 echo Study Conclusions  - Left ventricle: The cavity size was normal. Wall thickness was increased in a pattern of mild LVH. Systolic function was normal. The estimated ejection fraction  was in the range of 50% to 55%. Abnormal GLPSS at -15%, inferoseptal  strain abnormality. Basal to mid inferior/inferoseptal hypokinesis. Doppler parameters are consistent with abnormal left ventricular relaxation (grade 1 diastolic dysfunction). The E/e&' ratio is between 8-15, suggesting indeterminate LV filling pressure. - Aortic valve: Poorly visualized. Mild stenosis. Mildly calcified leaflets. There was mild regurgitation. Mean gradient (S): 13 mm Hg. Peak gradient (S): 28 mm Hg. - Left atrium: The atrium was normal in size. - Inferior vena cava: The vessel was normal in size. The respirophasic diameter changes were in the normal range (>= 50%), consistent with normal central venous pressure.  Impressions:  - LVEF 50-55%, mild LVH, basal to mid inferior, inferoseptal hypokinesis, abnormal GLPSS at -15%, grade 1 DD with indeterminate LV filling pressure, mild calcific aortic stenosis - mean gradient of 13 mmHg, normal LA size, normal IVC.  09/2016 Carotid US/ABI preCABG Summary:  1. Carotid Duplex Evaluation - Right internal carotid artery 1-39% stenosis, left internal carotid artery 40-59% stenosis. 2. Palmar Arch Evaluation - Right radial artery decreased greater than 50 percent with compression and right ulnar remains within normal limits with compression. . Left Doppler waveforms remain within normal limits with radial and ulnar compression. 3. ABI Evaluation - Right ABI appeared normal at rest. Left ABI is suggestive of moderate arterial disease at rest.   12/2016 echo Study Conclusions  - Left ventricle: The cavity size was normal. Wall thickness was normal. Systolic function was vigorous. The estimated ejection fraction was in the range of 65% to 70%. Wall motion was normal; there were no regional wall motion abnormalities. - Aortic valve: Moderately calcified annulus. Trileaflet; mildly calcified leaflets.  There was mild regurgitation. Mean gradient (S): 12 mm Hg. Peak gradient (S): 24 mm Hg. Valve area (VTI): 2.44 cm^2. - Mitral valve: Mildly calcified annulus. There was trivial regurgitation. - Left atrium: The atrium was mildly dilated. - Right atrium: Central venous pressure (est): 3 mm Hg. - Atrial septum: No defect or patent foramen ovale was identified. - Tricuspid valve: There was trivial regurgitation. - Pulmonary arteries: PA peak pressure: 28 mm Hg (S). - Pericardium, extracardiac: There was no pericardial effusion.  Impressions:  - Normal LV wall thickness with LVEF 65-70% and grade 2 diastolic dysfunction. Mild left atrial enlargement. Mildly calcified mitral annulus with trivial mitral regurgitation. Moderately sclerotic aortic valve with mild aortic regurgitation. Trivial tricuspid regurgitation with PASP estimated 28 mmHg.   02/2017 nuclear stress  There was no ST segment deviation noted during stress.  Findings consistent with prior moderate inferior/inferolateral myocardial infarction with mild to moderate peri-infarct ischemia.  The left ventricular ejection fraction is normal (55-65%).  Low to intermediate risk study  07/02/17 cath [facility] Findings: 1. Severe native CAD with occlusion of the LAD at the mid-portion and 80%  proximal RCA disease 2. Patent LIMA-LAD, LRA graft-distal RCA, SVG-OM/D1.  3. Occlusion of the RIMA mid-graft, though this was semi-engaged.  4. Diffuse plaque and acute on chronic occlusion of the SVG-D2. This is  the likely culprit vessel forhe NSTEMI but SVG is do severely diseased,  that percutaneous intervention is likely not beneficial. 5. Chronic occlusion of SVG-rPL.  Recommendations: 1. Medical Management of complex CAD and NSTEMI.  06/2017 echo [facility] Ultrasound enhancing agent utilized to improve endocardial border  definition  Left ventricular hypertrophy - mild  Normal left ventricular systolic function,  ejection fraction 65 to 70%  Segmental wall motion abnormality - (inferior / posterior)  Diastolic dysfunction - grade II (elevated filling pressures)  Degenerative mitral valve disease  Dilated left atrium - moderate to severe  Aortic sclerosis  Normal right ventricular systolic function  Tricuspid regurgitation - mild  Elevated pulmonary artery systolic pressure - mild  Dilated right atrium - mild  Assessment and Plan  1. Chronic diastolic HF - appears euvolemic. Diuretics held at recent discharge due to AKI from contrast dye - f/u repeat labs, likely will need to restart diuretics pending renal function  2. CAD - recent NSTEMI as described above involving a bypass graft, not amenable to PCI, treated medically at [facility], records reviewed - continue current meds  3. HTN - elevated in clinic. Recent issues with soft bp's during recent admission with NSTEMI - follow bp trend, with recent AKI avoid hypotension. Will continue current meds at this time, likely adjust if bp's remain elevated at f/u and renal function improves  4. AKI - Cr up to 2 during recent admission with NSTEMI, secondary to contrast dye - he is to have repeat labs at the [place] this week and call us with results. Consider restarting diuretics pending results.        Arnoldo Lenis, M.D.

## 2017-07-04 NOTE — Patient Instructions (Signed)
Your physician recommends that you schedule a follow-up appointment in: [place]  Your physician recommends that you continue on your current medications as directed. Please refer to the Current Medication list given to you today.  Thank you for choosing [facility]!!

## 2017-07-06 ENCOUNTER — Encounter: Payer: Self-pay | Admitting: Cardiology

## 2017-07-13 ENCOUNTER — Telehealth: Payer: Self-pay | Admitting: Cardiology

## 2017-07-13 NOTE — Telephone Encounter (Signed)
Pt aware and voiced understanding - wants to talk with the [place] [facility] and let them make referral to GI pt aware to make this ASAP.

## 2017-07-13 NOTE — Telephone Encounter (Signed)
LM to return call.

## 2017-07-13 NOTE — Telephone Encounter (Signed)
He is mildly more anemic than he was when he was discharged from [facility], with his black stools he probably is losing blood from somewhere in his GI tract. Aspirin and brillinta likely are making this worst. Since he didn't have a stent, ok to hold brillinta for now. He needs to see GI ASAP, has he ever seen a GI before that he can contact to see or does he want to see someone at the [place] he would need to discuss with pcp. If interested in seeing group in [place] who I think is good we can arrange a referal. Kidney function mildly improved but not back to his baseline, would not restart his fluid pill at this time   Zandra Abts MD

## 2017-07-13 NOTE — Telephone Encounter (Signed)
Pt says for the last 4-5 days stools have been black - denies any other bleeding/bruising - asymptomatic - says recent labs shows iron is low - pt is concerned taking Brilinta or if this is even the cause - lab work given to provider for review

## 2017-07-13 NOTE — Telephone Encounter (Signed)
Has a few problems. Did not want to go into detail until spoke nurse

## 2017-07-14 ENCOUNTER — Encounter: Payer: Self-pay | Admitting: Cardiology

## 2017-07-14 DIAGNOSIS — I509 Heart failure, unspecified: Secondary | ICD-10-CM | POA: Diagnosis not present

## 2017-07-14 DIAGNOSIS — I2510 Atherosclerotic heart disease of native coronary artery without angina pectoris: Secondary | ICD-10-CM | POA: Diagnosis not present

## 2017-07-14 DIAGNOSIS — K922 Gastrointestinal hemorrhage, unspecified: Secondary | ICD-10-CM | POA: Diagnosis not present

## 2017-07-14 DIAGNOSIS — D500 Iron deficiency anemia secondary to blood loss (chronic): Secondary | ICD-10-CM | POA: Diagnosis not present

## 2017-07-15 DIAGNOSIS — J449 Chronic obstructive pulmonary disease, unspecified: Secondary | ICD-10-CM | POA: Diagnosis not present

## 2017-07-15 DIAGNOSIS — I2510 Atherosclerotic heart disease of native coronary artery without angina pectoris: Secondary | ICD-10-CM | POA: Diagnosis not present

## 2017-07-15 DIAGNOSIS — D62 Acute posthemorrhagic anemia: Secondary | ICD-10-CM | POA: Diagnosis not present

## 2017-07-15 DIAGNOSIS — I10 Essential (primary) hypertension: Secondary | ICD-10-CM | POA: Diagnosis not present

## 2017-07-16 DIAGNOSIS — K921 Melena: Secondary | ICD-10-CM | POA: Diagnosis not present

## 2017-07-16 DIAGNOSIS — D62 Acute posthemorrhagic anemia: Secondary | ICD-10-CM | POA: Diagnosis not present

## 2017-07-16 DIAGNOSIS — J449 Chronic obstructive pulmonary disease, unspecified: Secondary | ICD-10-CM | POA: Diagnosis not present

## 2017-07-16 DIAGNOSIS — I2510 Atherosclerotic heart disease of native coronary artery without angina pectoris: Secondary | ICD-10-CM | POA: Diagnosis not present

## 2017-07-17 ENCOUNTER — Encounter: Payer: Self-pay | Admitting: Cardiology

## 2017-07-17 DIAGNOSIS — D62 Acute posthemorrhagic anemia: Secondary | ICD-10-CM | POA: Diagnosis not present

## 2017-07-17 DIAGNOSIS — J449 Chronic obstructive pulmonary disease, unspecified: Secondary | ICD-10-CM | POA: Diagnosis not present

## 2017-07-17 DIAGNOSIS — I2510 Atherosclerotic heart disease of native coronary artery without angina pectoris: Secondary | ICD-10-CM | POA: Diagnosis not present

## 2017-07-17 DIAGNOSIS — N183 Chronic kidney disease, stage 3 (moderate): Secondary | ICD-10-CM | POA: Diagnosis not present

## 2017-07-17 DIAGNOSIS — K2980 Duodenitis without bleeding: Secondary | ICD-10-CM | POA: Diagnosis not present

## 2017-07-17 DIAGNOSIS — K921 Melena: Secondary | ICD-10-CM | POA: Diagnosis not present

## 2017-07-18 DIAGNOSIS — J449 Chronic obstructive pulmonary disease, unspecified: Secondary | ICD-10-CM | POA: Diagnosis not present

## 2017-07-18 DIAGNOSIS — D62 Acute posthemorrhagic anemia: Secondary | ICD-10-CM | POA: Diagnosis not present

## 2017-07-18 DIAGNOSIS — N183 Chronic kidney disease, stage 3 (moderate): Secondary | ICD-10-CM | POA: Diagnosis not present

## 2017-07-18 DIAGNOSIS — I2510 Atherosclerotic heart disease of native coronary artery without angina pectoris: Secondary | ICD-10-CM | POA: Diagnosis not present

## 2017-07-19 DIAGNOSIS — K921 Melena: Secondary | ICD-10-CM | POA: Diagnosis not present

## 2017-07-19 DIAGNOSIS — D62 Acute posthemorrhagic anemia: Secondary | ICD-10-CM | POA: Diagnosis not present

## 2017-07-19 DIAGNOSIS — I2510 Atherosclerotic heart disease of native coronary artery without angina pectoris: Secondary | ICD-10-CM | POA: Diagnosis not present

## 2017-07-19 DIAGNOSIS — N183 Chronic kidney disease, stage 3 (moderate): Secondary | ICD-10-CM | POA: Diagnosis not present

## 2017-07-25 ENCOUNTER — Encounter: Payer: Self-pay | Admitting: Cardiology

## 2017-07-26 ENCOUNTER — Ambulatory Visit (INDEPENDENT_AMBULATORY_CARE_PROVIDER_SITE_OTHER): Payer: Medicare Other | Admitting: Cardiology

## 2017-07-26 ENCOUNTER — Encounter: Payer: Self-pay | Admitting: Cardiology

## 2017-07-26 VITALS — BP 160/71 | HR 54 | Ht 69.0 in | Wt 234.0 lb

## 2017-07-26 DIAGNOSIS — I5032 Chronic diastolic (congestive) heart failure: Secondary | ICD-10-CM | POA: Diagnosis not present

## 2017-07-26 DIAGNOSIS — I2510 Atherosclerotic heart disease of native coronary artery without angina pectoris: Secondary | ICD-10-CM | POA: Diagnosis not present

## 2017-07-26 DIAGNOSIS — I10 Essential (primary) hypertension: Secondary | ICD-10-CM | POA: Diagnosis not present

## 2017-07-26 DIAGNOSIS — N179 Acute kidney failure, unspecified: Secondary | ICD-10-CM

## 2017-07-26 MED ORDER — FUROSEMIDE 40 MG PO TABS: 40.0000 mg | ORAL_TABLET | Freq: Every day | ORAL | 1 refills | Status: DC | PRN

## 2017-07-26 MED ORDER — AMLODIPINE BESYLATE 5 MG PO TABS: 5.0000 mg | ORAL_TABLET | Freq: Every day | ORAL | 1 refills | Status: DC

## 2017-07-26 NOTE — Progress Notes (Signed)
Clinical Summary Mr. Bowker is a 72 y.o.male seen today for follow up of the following medical problems.  1.Chronicdiastolic HF - severe edema a few months ago that has now resolved.  - swelling did not improve with lower dose of norvasc. Had been on norvasc before without troubles - off diuretics since recent AKI, since that time no significant recurrence of edema.   - home weights trending down, eating healthier. No recent SOB/DOE.   2. HTN - meds changed during recent admission with CABG - norvasc 10, HCTZ 25, lisionpril 40mg  was stopped during that admission  - home bp's 150s/60s    3. CAD - hx of CABG in Jan 2008 (LIMA-LAD, SVG-D2, SVG-OM, SVG-RPL) - admit [facility]/14 [name] with NSTEMI, received DES to SVG-RCA. No LV gram.  - 08/2014 echo from [place] LVEF 60-65%  - had nuclear stress 2 at [facility]. Large moderate lateral defect fixed. 11/2015.  -cath at [facility] 03/18/16. LM 20%, prox LAD 100% CTO, D2 diffuse disease, LCX LIs, OM2 80%, RCA right PL segment 90%. LIMA patent, SVG-OM2 patent, SVG-D2 50% stenosis, SVG-right PL occluded. Recs were for medical management. Fairly stable findings from prior cath however SVG-RCA PL which was previously stented is now occluded.    - admit 09/2016 with NSTEMI, peak troponin 0.43. Cath as reported below. He was referred for repeat CABG - CABG 09/19/16 with RIMA-diag, left radial to distal RCA - echo 09/2016 LVEF 50-55% - has not been on ACE-I due to poor renal function  02/2017 nuclear stress: inferior/inferolateral infarct with mild to moderate ischemia.  - Admitted 06/2017 with NSTEMI to [facility]. Peak trop 10.  06/29/17 cath: chronic occluded LAD, 80% prox RCA Patent LIMA-LAD, LRA graft to distal RCA, SVG-OM/D1. Occluded RIMA graft, diffuse plaque and acute on chronic occlusion of the SVG-D2 thought culprit poor PCI target. Chronic occlusion of SVG-rPL - echo 06/2017 [facility] LVEF 65-70%, grade II diastolicdysfunction - post cath AKI  with Cr to 2. Thought due to cath dye, diuresis due to CHF.     -no recent chest pain  4. AKI - Cr up to 2 during recent admission at [place] - diuretics were held at discharge  -07/12/17 Cr trended down to 1.86. In 01/2017 he was 1.68   5. HTN - bp meds stopped after prior admission for low bp's - bp's have been trending up at home.  - recently started on hydralazine during admission a few days ago for GI bleed.   6. Anemia - noted on recent labs, [facility]. Recent black stools - he held brillinta due to ongoing decrease in Hgb.    - sent [facility] hospital. hgb was was to 4.1. Transfused 5 units of pRBCs - had EGD was normal. Discharged last Wed  - no recurrent black stools.  - had repeat labs with pcp this week   Past Medical History:  Diagnosis Date  . Acute myocardial infarction of other inferior wall, initial episode of care   . Anginal pain ([place])   . Anxiety   . Arthritis   . Cancer [facility])    bladder 2013 removed, has cystoscopy 10/2016, has returned x 2  . COPD (chronic obstructive pulmonary disease) ([place])   . Coronary artery disease    Artery bypass graft Jan 2008  . Dyspnea   . Dysrhythmia   . GERD (gastroesophageal reflux disease)   . Heart murmur   . History of hiatal hernia   .  Hyperlipidemia   . Hypertension   . Hypothyroidism   . PONV (postoperative nausea and vomiting)   . Sleep apnea   . Stroke ([place])   . Tobacco user      Allergies  Allergen Reactions  . Cardizem Cd [Diltiazem Hcl Er Beads] Palpitations    "Makes heart skip"     Current Outpatient Medications  Medication Sig Dispense Refill  . acetaminophen (TYLENOL) 325 MG tablet Take 2 tablets (650 mg total) by mouth every 6 (six) hours as needed for mild pain.    Marland Kitchen aspirin 81 MG chewable tablet Chew 1 tablet (81 mg total) by mouth daily.    Marland Kitchen atorvastatin (LIPITOR) 80 MG tablet Take 80 mg by mouth daily.    . ergocalciferol (VITAMIN D2) 50000 UNITS capsule  Take 50,000 Units by mouth every Sunday.     . hydrALAZINE (APRESOLINE) 50 MG tablet Take 50 mg by mouth 3 (three) times daily.    . isosorbide mononitrate (IMDUR) 30 MG 24 hr tablet Take 30 mg by mouth daily.    Marland Kitchen levothyroxine (SYNTHROID, LEVOTHROID) 25 MCG tablet Take 25 mcg by mouth daily before breakfast.    . magnesium oxide (MAG-OX) 400 MG tablet Take 400 mg by mouth daily.    . metoprolol succinate (TOPROL-XL) 50 MG 24 hr tablet Take 50 mg by mouth daily. Take with or immediately following a meal.    . nitroGLYCERIN (NITROSTAT) 0.4 MG SL tablet Place 0.4 mg under the tongue every 5 (five) minutes as needed for chest pain.    . Omega-3 Fatty Acids (RA FISH OIL) 1000 MG CAPS Take 2,000 mg by mouth 2 (two) times daily.     Marland Kitchen omeprazole (PRILOSEC) 20 MG capsule Take 40 mg by mouth daily.     . potassium chloride SA (KLOR-CON M20) 20 MEQ tablet TAKE 1 TABLET DAILY 90 tablet 1  . PRESCRIPTION MEDICATION 1 each. Cortisone Injection into each knee every 4 months.    . ticagrelor (BRILINTA) 90 MG TABS tablet Take by mouth 2 (two) times daily. ON HOLD 07/13/17     No current facility-administered medications for this visit.      Past Surgical History:  Procedure Laterality Date  . APPENDECTOMY    . CARDIAC CATHETERIZATION    . CORONARY ARTERY BYPASS GRAFT  06/14/2006   x 5  . CORONARY ARTERY BYPASS GRAFT N/A 09/19/2016   Procedure: REDO CORONARY ARTERY BYPASS GRAFTING (CABG) x two, using right internal mammary artery and left radial artery;  Surgeon: [name], MD;  Location: [place];  Service: Open Heart Surgery;  Laterality: N/A;  . FOOT SURGERY    . HERNIA REPAIR    . LEFT HEART CATH AND CORS/GRAFTS ANGIOGRAPHY N/A 09/14/2016   Procedure: Left Heart Cath and Cors/Grafts Angiography;  Surgeon: Troy Sine, MD;  Location: [place] CV LAB;  Service: Cardiovascular;  Laterality: N/A;  . LEFT HEART CATHETERIZATION WITH CORONARY/GRAFT ANGIOGRAM N/A 01/31/2014   Procedure: LEFT HEART  CATHETERIZATION WITH Beatrix Fetters;  Surgeon: Sanda Klein, MD;  Location: [place] CATH LAB;  Service: Cardiovascular;  Laterality: N/A;  . NOSE SURGERY    . PERCUTANEOUS CORONARY STENT INTERVENTION (PCI-S)  01/31/2014   Procedure: PERCUTANEOUS CORONARY STENT INTERVENTION (PCI-S);  Surgeon: Sanda Klein, MD;  Location: [facility] CATH LAB;  Service: Cardiovascular;;  . RADIAL ARTERY HARVEST Left 09/19/2016   Procedure: RADIAL ARTERY HARVEST;  Surgeon: [name], MD;  Location: [place];  Service: Open Heart Surgery;  Laterality: Left;  .  TEE WITHOUT CARDIOVERSION N/A 09/19/2016   Procedure: TRANSESOPHAGEAL ECHOCARDIOGRAM (TEE);  Surgeon: [name], MD;  Location: [place];  Service: Open Heart Surgery;  Laterality: N/A;     Allergies  Allergen Reactions  . Cardizem Cd [Diltiazem Hcl Er Beads] Palpitations    "Makes heart skip"      Family History  Problem Relation Age of Onset  . Heart attack Mother   . Heart attack Brother      Social History Mr. Reasoner reports that he quit smoking about 10 months ago. His smoking use included cigarettes. He started smoking about 58 years ago. He has a 40.00 pack-year smoking history. he has never used smokeless tobacco. Mr. Timberman reports that he does not drink alcohol.   Review of Systems CONSTITUTIONAL: No weight loss, fever, chills, weakness or fatigue.  HEENT: Eyes: No visual loss, blurred vision, double vision or yellow sclerae.No hearing loss, sneezing, congestion, runny nose or sore throat.  SKIN: No rash or itching.  CARDIOVASCULAR: per hpi RESPIRATORY: per hpi GASTROINTESTINAL: No anorexia, nausea, vomiting or diarrhea. No abdominal pain or blood.  GENITOURINARY: No burning on urination, no polyuria NEUROLOGICAL: No headache, dizziness, syncope, paralysis, ataxia, numbness or tingling in the extremities. No change in bowel or bladder control.  MUSCULOSKELETAL: No muscle, back pain, joint pain or stiffness.  LYMPHATICS: No  enlarged nodes. No history of splenectomy.  PSYCHIATRIC: No history of depression or anxiety.  ENDOCRINOLOGIC: No reports of sweating, cold or heat intolerance. No polyuria or polydipsia.  Marland Kitchen   Physical Examination Vitals:   07/26/17 1345  BP: (!) 160/71  Pulse: (!) 54  SpO2: 98%   Vitals:   07/26/17 1345  Weight: 234 lb (106.1 kg)  Height: 5\' 9"  (1.753 m)    Gen: resting comfortably, no acute distress HEENT: no scleral icterus, pupils equal round and reactive, no palptable cervical adenopathy,  CV: RRR, 2/6 sysotlicm murmur at apex, no jvd Resp: Clear to auscultation bilaterally GI: abdomen is soft, non-tender, non-distended, normal bowel sounds, no hepatosplenomegaly MSK: extremities are warm, no edema.  Skin: warm, no rash Neuro:  no focal deficits Psych: appropriate affect   Diagnostic Studies 01/31/14 Cath Angiographic Findings: 1. The left main coronary arteryis not stenotic, but exhibits significant atherosclerosis and bifurcates in the usual fashion into the left anterior descending artery and left circumflex coronary artery.  2. The left anterior descending arteryis a large vessel that reaches the apex and generates several proximal septals, but is occluded before any of the major diagonal branches. There is evidence of extensive luminal irregularities and mild calcification.  3. The left circumflex coronary arteryis a medium-size vessel non dominant vessel that generates two major oblique marginal arteries. There is evidence of extensive luminal irregularities and mild calcification. No hemodynamically meaningful stenoses are seen. The first OM is flush occluded. The distal OM is medium in size.  4. The right coronary arteryis a very large-size dominant vessel that generates a posterior lateral ventricular artery that is proximally occluded as well as a diffusely diseased PDA. There is evidence of extensive luminal irregularities and mild calcification.  5. The  sequential SVG to the diagonal and first OMis very irregular in caliber, but widely patent, without stenoses. The first diagonal artery is severely diseased and small in caliber. The OM [place] is large and free of major stenoses.  6. The SVG to the second diagonalis widely patent and healthy, feeding a relatively large vessel.  7. The LIMA to the LADis widely patent with  both retrograde and antegrade LAD flow. There are significant stenoses both upstream and downstream of the anastomosis, but there is excellent flow.  8. The SVG to RCA-PLA has a severe, ulcerated stenosis in its distal third, at least 95% in severity, but with TIMI 3 flow. It is the culprit lesion.  5. The left ventriclewas not injected.There is no aortic valve stenosis by pullback. The left ventricular end-diastolic pressure is 24 mm Hg.   IMPRESSIONS: High grade unstable stenosis in the SVG to PLA causing NSTEMI  RECOMMENDATION: Urgent PCI, preferably with filter wire.   IMPRESSIONS: 1. Successful PCI of the SVG to right coronary artery graft with a 3.0 x 23 Alpine drug-eluting stent - dilated to 3.25 mm in diameter, using a distal embolic protection device, 4.0 spider. 2.         04/2014 Carotid US Moderate bilateral stenosis  12/2011 Abdominal US No aortic aneurysm   Jan 2013 [facility] LVEF 65%      [facility]/2014 [facility]F 60-65%, aortic valve sclerosis.   cath at [facility] 03/18/16. LM 20%, prox LAD 100% CTO, D2 diffuse disease, LCX LIs, OM2 80%, RCA right PL segment 90%. LIMA patent, SVG-OM2 patent, SVG-D2 50% stenosis, SVG-right PL occluded. Recs were for medical management.  09/2016 cath  The left ventricular ejection fraction is 45-50% by visual estimate.  LV end diastolic pressure is normal.  There is mild left ventricular systolic dysfunction.  Dist RCA lesion, 90 %stenosed.  SVG.  Dist Graft-1 lesion, 95 %stenosed.  Dist Graft-2 lesion, 50 %stenosed.  Prox Graft lesion, 80  %stenosed.  Ost 2nd Mrg lesion, 100 %stenosed.  SVG.  Mid Graft lesion, 100 %stenosed.  Prox Graft lesion, 100 %stenosed.  Seq SVG-.  Origin lesion, 99 %stenosed.  Prox LAD lesion, 100 %stenosed.  Prox Graft to Dist Graft lesion, 25 %stenosed.  Ost LAD to Prox LAD lesion, 80 %stenosed.  Multivessel native CAD with diffuse 80% proximal LAD stenosis and occlusion of the LAD after a small first diagonal vessel; occluded marginal Tiari Andringa arising from the mid AV groove circumflex with an otherwise patent AV groove circumflex extending distally; RCA with 20% mid narrowing normal PDA, but with 90% PLA stenosis.  Patent LIMA graft supplying the mid LAD.  SVG sequentially supplying the diagonal-1 and OM vessel with 99% near ostial graft stenosis and diffuse luminal irregularity in the body of the graft prior to the initial anastomosis.  SVG supplying the diagonal 2 vessel with diffuse proximal to mid graft stenosis, 95% ulcerated plaque stenosis in the distal third of the graft and 50% eccentric stenosis prior to its anastomosis.  SVG which had previously supplied the PLA and had been stented, is occluded proximally.  Mild-to-moderate LV dysfunction with mid distal anterolateral hypocontractility and an ejection fraction of 45-50%.  RECOMMENDATION: The patient had previously undergone CABG surgery by Dr. Cyndia Bent in 2008. Dr. Cyndia Bent will be re-consulted for evaluation and consideration of possible redo CABG revascularization surgery.   09/2016 echo Study Conclusions  - Left ventricle: The cavity size was normal. Wall thickness was increased in a pattern of mild LVH. Systolic function was normal. The estimated ejection fraction was in the range of 50% to 55%. Abnormal GLPSS at -15%, inferoseptal strain abnormality. Basal to mid inferior/inferoseptal hypokinesis. Doppler parameters are consistent with abnormal left ventricular relaxation (grade 1 diastolic  dysfunction). The E/e&' ratio is between 8-15, suggesting indeterminate LV filling pressure. - Aortic valve: Poorly visualized. Mild stenosis. Mildly calcified leaflets. There was mild regurgitation. Mean gradient (  S): 13 mm Hg. Peak gradient (S): 28 mm Hg. - Left atrium: The atrium was normal in size. - Inferior vena cava: The vessel was normal in size. The respirophasic diameter changes were in the normal range (>= 50%), consistent with normal central venous pressure.  Impressions:  - LVEF 50-55%, mild LVH, basal to mid inferior, inferoseptal hypokinesis, abnormal GLPSS at -15%, grade 1 DD with indeterminate LV filling pressure, mild calcific aortic stenosis - mean gradient of 13 mmHg, normal LA size, normal IVC.  09/2016 Carotid US/ABI preCABG Summary:  1. Carotid Duplex Evaluation - Right internal carotid artery 1-39% stenosis, left internal carotid artery 40-59% stenosis. 2. Palmar Arch Evaluation - Right radial artery decreased greater than 50 percent with compression and right ulnar remains within normal limits with compression. . Left Doppler waveforms remain within normal limits with radial and ulnar compression. 3. ABI Evaluation - Right ABI appeared normal at rest. Left ABI is suggestive of moderate arterial disease at rest.   12/2016 echo Study Conclusions  - Left ventricle: The cavity size was normal. Wall thickness was normal. Systolic function was vigorous. The estimated ejection fraction was in the range of 65% to 70%. Wall motion was normal; there were no regional wall motion abnormalities. - Aortic valve: Moderately calcified annulus. Trileaflet; mildly calcified leaflets. There was mild regurgitation. Mean gradient (S): 12 mm Hg. Peak gradient (S): 24 mm Hg. Valve area (VTI): 2.44 cm^2. - Mitral valve: Mildly calcified annulus. There was trivial regurgitation. - Left atrium: The atrium was mildly  dilated. - Right atrium: Central venous pressure (est): 3 mm Hg. - Atrial septum: No defect or patent foramen ovale was identified. - Tricuspid valve: There was trivial regurgitation. - Pulmonary arteries: PA peak pressure: 28 mm Hg (S). - Pericardium, extracardiac: There was no pericardial effusion.  Impressions:  - Normal LV wall thickness with LVEF 65-70% and grade 2 diastolic dysfunction. Mild left atrial enlargement. Mildly calcified mitral annulus with trivial mitral regurgitation. Moderately sclerotic aortic valve with mild aortic regurgitation. Trivial tricuspid regurgitation with PASP estimated 28 mmHg.   02/2017 nuclear stress  There was no ST segment deviation noted during stress.  Findings consistent with prior moderate inferior/inferolateral myocardial infarction with mild to moderate peri-infarct ischemia.  The left ventricular ejection fraction is normal (55-65%).  Low to intermediate risk study  07/02/17 cath [facility] Findings: 1. Severe native CAD with occlusion of the LAD at the mid-portion and 80%  proximal RCA disease 2. Patent LIMA-LAD, LRA graft-distal RCA, SVG-OM/D1.  3. Occlusion of the RIMA mid-graft, though this was semi-engaged.  4. Diffuse plaque and acute on chronic occlusion of the SVG-D2. This is  the likely culprit vessel forhe NSTEMI but SVG is do severely diseased,  that percutaneous intervention is likely not beneficial. 5. Chronic occlusion of SVG-rPL.  Recommendations: 1. Medical Management of complex CAD and NSTEMI.  06/2017 echo [facility] Ultrasound enhancing agent utilized to improve endocardial border  definition  Left ventricular hypertrophy - mild  Normal left ventricular systolic function, ejection fraction 65 to 70%  Segmental wall motion abnormality - (inferior / posterior)  Diastolic dysfunction - grade II (elevated filling pressures)  Degenerative mitral valve disease  Dilated left atrium - moderate to severe   Aortic sclerosis  Normal right ventricular systolic function  Tricuspid regurgitation - mild  Elevated pulmonary artery systolic pressure - mild  Dilated right atrium - mild     Assessment and Plan  1. Chronicdiastolic HF - appears euvolemic. Diuretics held at recent discharge  due to AKI from contrast dye - Cr has been improving, has not had recurrence of edema. Continue to moniotor. We will give lasix 40mg  prn only.    2. CAD - recent NSTEMI as described above involving a bypass graft, not amenable to PCI, treated medically at [facility], records reviewed - no recent cardiac symptoms - due to recent GI bleed he is off brillinta and on plavix. Note on DAPT for medically managed NSTEMI, he did not receive a stent.   3. HTN - elevated in clinic. Start norvasc 5mg  daily. Note significant LE edema in the past thought to be secondary to his diastolic HF as opposed to norvasc, however if redevelops shortly after starting  would need to consider norvasc as contributing.   4. AKI - Cr up to 2 during recent admission with NSTEMI, secondary to contrast dye - has been improving, continue to monitor labs followed by pcp  5. Anemia - recent GI bleed, request f/u labs from pcp - off brillinta, remains on DAPT. If neccesary could stop plavix as he did not receive a stent after his most recent NSTEMI - Request records from [place]    F/u 3 months   Arnoldo Lenis, M.D.

## 2017-07-26 NOTE — Patient Instructions (Signed)
Your physician wants you to follow-up in: 3 [place] will receive a reminder letter in the mail two months in advance. If you don't receive a letter, please call our office to schedule the follow-up appointment.  Your physician has recommended you make the following change in your medication:   START AMLODIPINE 5 MG DAILY   START LASIX 40 MG DAILY AS NEEDED  Thank you for choosing [facility]!!

## 2017-07-27 ENCOUNTER — Encounter: Payer: Self-pay | Admitting: *Deleted

## 2017-07-30 ENCOUNTER — Encounter: Payer: Self-pay | Admitting: Cardiology

## 2017-08-09 ENCOUNTER — Telehealth: Payer: Self-pay | Admitting: *Deleted

## 2017-08-09 NOTE — Telephone Encounter (Signed)
Pt noticed black stools this morning - denies any other symptoms or noticing any other bleeding prior to this morning - concerned that he needed to hold ASA and plavix did take both this morning. Was recently admitted and received transfusion. Was referred to GI about a month ago and hasn't gotten an appointment scheduled yet (pt sees mostly [place] doctors) had repeat labs done yesterday and says hgb was 7.8 - nurse who called with lab results to pt he needed evaluation in the ED. Pt says he resumed plavix and ASA on 3/6 and thinks he needs to just stop these. Advised pt that he should follow [place] however pt says he still would like Dr Harl Bowie recs as well (says he knows he is "hard headed" but says this problem cleared up after he held blood thinners before)

## 2017-08-10 NOTE — Telephone Encounter (Signed)
LM to call back.

## 2017-08-10 NOTE — Telephone Encounter (Signed)
Stop aspirin and plavix, in 3 days if no recurrent black stools I would restart aspirin 81mg  and leave off the plavix. His Hgb is just slightly lower from his labs on 3/13. I can't speak for his primary team if they want him to be seen in the ER that is there recommendation. If he is against that I think its reasonable to stop the aspirin and plavix and if the black stools stop and he is asymptomatic would be reasonable to monitor at home. If black stools continue or he becomes symptomatic (SOB, fatigue, lightheaded/dizzy) he needs to go to ER. [facility]    Carlyle Dolly MD

## 2017-08-15 NOTE — Telephone Encounter (Signed)
Pt still admitted at [facility] - has been there since we last spoke 3/28 - pt will update [place] after discharge - says care team are still running test on what is causing bleeding

## 2017-08-21 ENCOUNTER — Ambulatory Visit: Payer: Medicare Other | Admitting: Cardiology

## 2017-11-01 ENCOUNTER — Ambulatory Visit (INDEPENDENT_AMBULATORY_CARE_PROVIDER_SITE_OTHER): Payer: Medicare Other | Admitting: Cardiology

## 2017-11-01 ENCOUNTER — Encounter: Payer: Self-pay | Admitting: *Deleted

## 2017-11-01 ENCOUNTER — Encounter: Payer: Self-pay | Admitting: Cardiology

## 2017-11-01 VITALS — BP 162/61 | HR 53 | Ht 69.0 in | Wt 230.8 lb

## 2017-11-01 DIAGNOSIS — I10 Essential (primary) hypertension: Secondary | ICD-10-CM

## 2017-11-01 DIAGNOSIS — I5032 Chronic diastolic (congestive) heart failure: Secondary | ICD-10-CM

## 2017-11-01 DIAGNOSIS — I2510 Atherosclerotic heart disease of native coronary artery without angina pectoris: Secondary | ICD-10-CM | POA: Diagnosis not present

## 2017-11-01 NOTE — Progress Notes (Signed)
Clinical Summary [name] is a 72 y.o.male seen today for follow up of the following medical problems.  1.Chronicdiastolic HF - severe edema a few months ago that has now resolved.  - swelling did not improve with lower dose of norvasc. Had been on norvasc before without troubles  - denies any recent issues with edema. No significant SOB/DOE.      3. CAD - hx of CABG in Jan 2008 (LIMA-LAD, SVG-D2, SVG-OM, SVG-RPL) - admit 01/30/14 [name] with NSTEMI, received DES to SVG-RCA. No LV gram.  - 08/2014 echo from [place] LVEF 60-65%  - had nuclear stress 2 at [facility]. Large moderate lateral defect fixed. 11/2015.  -cath at [facility] 03/18/16. LM 20%, prox LAD 100% CTO, D2 diffuse disease, LCX LIs, OM2 80%, RCA right PL segment 90%. LIMA patent, SVG-OM2 patent, SVG-D2 50% stenosis, SVG-right PL occluded. Recs were for medical management. Fairly stable findings from prior cath however SVG-RCA PL which was previously stented is now occluded.    - admit 09/2016 with NSTEMI, peak troponin 0.43. Cath as reported below. He was referred for repeat CABG - CABG 09/19/16 with RIMA-diag, left radial to distal RCA - echo 09/2016 LVEF 50-55% - has not been on ACE-I due to poor renal function  02/2017 nuclear stress: inferior/inferolateral infarct with mild to moderate ischemia.  -Admitted 06/2017 with NSTEMI to [facility]. Peak trop 10.  06/29/17 cath: chronic occluded LAD, 80% prox RCA Patent LIMA-LAD, LRA graft to distal RCA, SVG-OM/D1. Occluded RIMA graft,diffuse plaque and acute on chronic occlusion of the SVG-D2 thought culprit poor PCI target.Chronic occlusion of SVG-rPL - echo 06/2017 [facility] LVEF 65-70%, grade II diastolicdysfunction - post cath AKI with Cr to 2. Thought due to cath dye, diuresis due to CHF.   - on plavix alone currently due to bleeding on DAPT - no recent chest pain since our last visit.   4. AKI - Cr up to 2 during recent admission at [facility] - diuretics were  held at discharge  -07/12/17 Cr trended down to 1.86. In 01/2017 he was 1.68 - followed  by nephrology.    5. HTN - bp meds stopped after prior admission for low bp's - bp's have been trending up at home.   - nephrology added aldactone. Hydralazine changed to 50mg  bid. Aldactone to 100mg . Has repeat blood work next week, has f/u July 15th.  - 130s/50s at home    6. Anemia - noted on recent labs, [facility]. Recent black stools - he held brillinta due to ongoing decrease in Hgb.    - [facility] hgb was was to 4.1. Transfused 5 units of pRBCs - had EGD was normal. Discharged last Wed  - no recurrent black stools.  - had repeat labs with pcp this week  - reports EGD, colonscopy, and pill endoscopy. He reports some evidence of possible source, was cauterized.  - no recurrence. Reports Hgb 9.8 2 weeks ago. Off ASA, remains on plavix.     Past Medical History:  Diagnosis Date  . Acute myocardial infarction of other inferior wall, initial episode of care   . Anginal pain ([name])   . Anxiety   . Arthritis   . Cancer [name])    bladder 2013 removed, has cystoscopy 10/2016, has returned x 2  . COPD (chronic obstructive pulmonary disease) ([place])   . Coronary artery disease    Artery bypass graft Jan 2008  . Dyspnea   .  Dysrhythmia   . GERD (gastroesophageal reflux disease)   . Heart murmur   . History of hiatal hernia   . Hyperlipidemia   . Hypertension   . Hypothyroidism   . PONV (postoperative nausea and vomiting)   . Sleep apnea   . Stroke ([place])   . Tobacco user      Allergies  Allergen Reactions  . Cardizem Cd [Diltiazem Hcl Er Beads] Palpitations    "Makes heart skip"     Current Outpatient Medications  Medication Sig Dispense Refill  . acetaminophen (TYLENOL) 325 MG tablet Take 2 tablets (650 mg total) by mouth every 6 (six) hours as needed for mild pain.    Marland Kitchen amLODipine (NORVASC) 5 MG tablet Take 1 tablet (5 mg total) by  mouth daily. 90 tablet 1  . aspirin 81 MG chewable tablet Chew 1 tablet (81 mg total) by mouth daily.    Marland Kitchen atorvastatin (LIPITOR) 80 MG tablet Take 80 mg by mouth daily.    . clopidogrel (PLAVIX) 75 MG tablet Take 75 mg by mouth daily.    . ergocalciferol (VITAMIN D2) 50000 UNITS capsule Take 50,000 Units by mouth every Sunday.     . furosemide (LASIX) 40 MG tablet Take 1 tablet (40 mg total) by mouth daily as needed. 90 tablet 1  . hydrALAZINE (APRESOLINE) 50 MG tablet Take 50 mg by mouth 3 (three) times daily.    . isosorbide mononitrate (IMDUR) 120 MG 24 hr tablet Take 120 mg by mouth daily.    Marland Kitchen levothyroxine (SYNTHROID, LEVOTHROID) 25 MCG tablet Take 25 mcg by mouth daily before breakfast.    . magnesium oxide (MAG-OX) 400 MG tablet Take 400 mg by mouth daily.    . metoprolol succinate (TOPROL-XL) 50 MG 24 hr tablet Take 50 mg by mouth daily. Take with or immediately following a meal.    . nitroGLYCERIN (NITROSTAT) 0.4 MG SL tablet Place 0.4 mg under the tongue every 5 (five) minutes as needed for chest pain.    . Omega-3 Fatty Acids (RA FISH OIL) 1000 MG CAPS Take 2,000 mg by mouth 2 (two) times daily.     Marland Kitchen omeprazole (PRILOSEC) 20 MG capsule Take 40 mg by mouth daily.     . potassium chloride SA (KLOR-CON M20) 20 MEQ tablet TAKE 1 TABLET DAILY 90 tablet 1  . PRESCRIPTION MEDICATION 1 each. Cortisone Injection into each knee every 4 months.     No current facility-administered medications for this visit.      Past Surgical History:  Procedure Laterality Date  . APPENDECTOMY    . CARDIAC CATHETERIZATION    . CORONARY ARTERY BYPASS GRAFT  06/14/2006   x 5  . CORONARY ARTERY BYPASS GRAFT N/A 09/19/2016   Procedure: REDO CORONARY ARTERY BYPASS GRAFTING (CABG) x two, using right internal mammary artery and left radial artery;  Surgeon: [name], MD;  Location: [place];  Service: Open Heart Surgery;  Laterality: N/A;  . FOOT SURGERY    . HERNIA REPAIR    . LEFT HEART CATH AND  CORS/GRAFTS ANGIOGRAPHY N/A 09/14/2016   Procedure: Left Heart Cath and Cors/Grafts Angiography;  Surgeon: Troy Sine, MD;  Location: [place] CV LAB;  Service: Cardiovascular;  Laterality: N/A;  . LEFT HEART CATHETERIZATION WITH CORONARY/GRAFT ANGIOGRAM N/A 01/31/2014   Procedure: LEFT HEART CATHETERIZATION WITH Beatrix Fetters;  Surgeon: Sanda Klein, MD;  Location: [place] CATH LAB;  Service: Cardiovascular;  Laterality: N/A;  . NOSE SURGERY    . PERCUTANEOUS  CORONARY STENT INTERVENTION (PCI-S)  01/31/2014   Procedure: PERCUTANEOUS CORONARY STENT INTERVENTION (PCI-S);  Surgeon: Sanda Klein, MD;  Location: [facility] CATH LAB;  Service: Cardiovascular;;  . RADIAL ARTERY HARVEST Left 09/19/2016   Procedure: RADIAL ARTERY HARVEST;  Surgeon: [name], MD;  Location: [place];  Service: Open Heart Surgery;  Laterality: Left;  . TEE WITHOUT CARDIOVERSION N/A 09/19/2016   Procedure: TRANSESOPHAGEAL ECHOCARDIOGRAM (TEE);  Surgeon: [name], MD;  Location: [place];  Service: Open Heart Surgery;  Laterality: N/A;     Allergies  Allergen Reactions  . Cardizem Cd [Diltiazem Hcl Er Beads] Palpitations    "Makes heart skip"      Family History  Problem Relation Age of Onset  . Heart attack Mother   . Heart attack Brother      Social History Mr. Lange reports that he quit smoking about 13 months ago. His smoking use included cigarettes. He started smoking about 58 years ago. He has a 40.00 pack-year smoking history. He has never used smokeless tobacco. Mr. Patalano reports that he does not drink alcohol.   Review of Systems CONSTITUTIONAL: No weight loss, fever, chills, weakness or fatigue.  HEENT: Eyes: No visual loss, blurred vision, double vision or yellow sclerae.No hearing loss, sneezing, congestion, runny nose or sore throat.  SKIN: No rash or itching.  CARDIOVASCULAR: per hpi RESPIRATORY: No shortness of breath, cough or sputum.  GASTROINTESTINAL: No anorexia, nausea,  vomiting or diarrhea. No abdominal pain or blood.  GENITOURINARY: No burning on urination, no polyuria NEUROLOGICAL: No headache, dizziness, syncope, paralysis, ataxia, numbness or tingling in the extremities. No change in bowel or bladder control.  MUSCULOSKELETAL: No muscle, back pain, joint pain or stiffness.  LYMPHATICS: No enlarged nodes. No history of splenectomy.  PSYCHIATRIC: No history of depression or anxiety.  ENDOCRINOLOGIC: No reports of sweating, cold or heat intolerance. No polyuria or polydipsia.  Marland Kitchen   Physical Examination Vitals:   11/01/17 1404  BP: (!) 162/61  Pulse: (!) 53  SpO2: 97%   Vitals:   11/01/17 1404  Weight: 230 lb 12.8 oz (104.7 kg)  Height: 5\' 9"  (1.753 m)    Gen: resting comfortably, no acute distress HEENT: no scleral icterus, pupils equal round and reactive, no palptable cervical adenopathy,  CV: RRR, no m/r/g,no jvd Resp: Clear to auscultation bilaterally GI: abdomen is soft, non-tender, non-distended, normal bowel sounds, no hepatosplenomegaly MSK: extremities are warm, no edema.  Skin: warm, no rash Neuro:  no focal deficits Psych: appropriate affect   Diagnostic Studies 01/31/14 Cath Angiographic Findings: 1. The left main coronary arteryis not stenotic, but exhibits significant atherosclerosis and bifurcates in the usual fashion into the left anterior descending artery and left circumflex coronary artery.  2. The left anterior descending arteryis a large vessel that reaches the apex and generates several proximal septals, but is occluded before any of the major diagonal branches. There is evidence of extensive luminal irregularities and mild calcification.  3. The left circumflex coronary arteryis a medium-size vessel non dominant vessel that generates two major oblique marginal arteries. There is evidence of extensive luminal irregularities and mild calcification. No hemodynamically meaningful stenoses are seen. The first OM is  flush occluded. The distal OM is medium in size.  4. The right coronary arteryis a very large-size dominant vessel that generates a posterior lateral ventricular artery that is proximally occluded as well as a diffusely diseased PDA. There is evidence of extensive luminal irregularities and mild calcification.  5. The sequential  SVG to the diagonal and first OMis very irregular in caliber, but widely patent, without stenoses. The first diagonal artery is severely diseased and small in caliber. The OM branch is large and free of major stenoses.  6. The SVG to the second diagonalis widely patent and healthy, feeding a relatively large vessel.  7. The LIMA to the LADis widely patent with both retrograde and antegrade LAD flow. There are significant stenoses both upstream and downstream of the anastomosis, but there is excellent flow.  8. The SVG to RCA-PLA has a severe, ulcerated stenosis in its distal third, at least 95% in severity, but with TIMI 3 flow. It is the culprit lesion.  5. The left ventriclewas not injected.There is no aortic valve stenosis by pullback. The left ventricular end-diastolic pressure is 24 mm Hg.   IMPRESSIONS: High grade unstable stenosis in the SVG to PLA causing NSTEMI  RECOMMENDATION: Urgent PCI, preferably with filter wire.   IMPRESSIONS: 1. Successful PCI of the SVG to right coronary artery graft with a 3.0 x 23 Alpine drug-eluting stent - dilated to 3.25 mm in diameter, using a distal embolic protection device, 4.0 spider. 2.         04/2014 Carotid US Moderate bilateral stenosis  12/2011 Abdominal US No aortic aneurysm   Jan 2013 [facility] LVEF 65%      08/2014 [facility] L[facility]F 60-65%, aortic valve sclerosis.   c[facility] at [facility] 03/18/16. LM 20%, prox LAD 100% CTO, D2 diffuse disease, LCX LIs, OM2 80%, RCA right PL segment 90%. LIMA patent, SVG-OM2 patent, SVG-D2 50% stenosis, SVG-right PL occluded. Recs were for medical  management.  09/2016 cath  The left ventricular ejection fraction is 45-50% by visual estimate.  LV end diastolic pressure is normal.  There is mild left ventricular systolic dysfunction.  Dist RCA lesion, 90 %stenosed.  SVG.  Dist Graft-1 lesion, 95 %stenosed.  Dist Graft-2 lesion, 50 %stenosed.  Prox Graft lesion, 80 %stenosed.  Ost 2nd Mrg lesion, 100 %stenosed.  SVG.  Mid Graft lesion, 100 %stenosed.  Prox Graft lesion, 100 %stenosed.  Seq SVG-.  Origin lesion, 99 %stenosed.  Prox LAD lesion, 100 %stenosed.  Prox Graft to Dist Graft lesion, 25 %stenosed.  Ost LAD to Prox LAD lesion, 80 %stenosed.  Multivessel native CAD with diffuse 80% proximal LAD stenosis and occlusion of the LAD after a small first diagonal vessel; occluded marginal branch arising from the mid AV groove circumflex with an otherwise patent AV groove circumflex extending distally; RCA with 20% mid narrowing normal PDA, but with 90% PLA stenosis.  Patent LIMA graft supplying the mid LAD.  SVG sequentially supplying the diagonal-1 and OM vessel with 99% near ostial graft stenosis and diffuse luminal irregularity in the body of the graft prior to the initial anastomosis.  SVG supplying the diagonal 2 vessel with diffuse proximal to mid graft stenosis, 95% ulcerated plaque stenosis in the distal third of the graft and 50% eccentric stenosis prior to its anastomosis.  SVG which had previously supplied the PLA and had been stented, is occluded proximally.  Mild-to-moderate LV dysfunction with mid distal anterolateral hypocontractility and an ejection fraction of 45-50%.  RECOMMENDATION: The patient had previously undergone CABG surgery by Dr. Cyndia Bent in 2008. Dr. Cyndia Bent will be re-consulted for evaluation and consideration of possible redo CABG revascularization surgery.   09/2016 echo Study Conclusions  - Left ventricle: The cavity size was normal. Wall thickness was increased in  a pattern of mild LVH. Systolic function was normal.  The estimated ejection fraction was in the range of 50% to 55%. Abnormal GLPSS at -15%, inferoseptal strain abnormality. Basal to mid inferior/inferoseptal hypokinesis. Doppler parameters are consistent with abnormal left ventricular relaxation (grade 1 diastolic dysfunction). The E/e&' ratio is between 8-15, suggesting indeterminate LV filling pressure. - Aortic valve: Poorly visualized. Mild stenosis. Mildly calcified leaflets. There was mild regurgitation. Mean gradient (S): 13 mm Hg. Peak gradient (S): 28 mm Hg. - Left atrium: The atrium was normal in size. - Inferior vena cava: The vessel was normal in size. The respirophasic diameter changes were in the normal range (>= 50%), consistent with normal central venous pressure.  Impressions:  - LVEF 50-55%, mild LVH, basal to mid inferior, inferoseptal hypokinesis, abnormal GLPSS at -15%, grade 1 DD with indeterminate LV filling pressure, mild calcific aortic stenosis - mean gradient of 13 mmHg, normal LA size, normal IVC.  09/2016 Carotid US/ABI preCABG Summary:  1. Carotid Duplex Evaluation - Right internal carotid artery 1-39% stenosis, left internal carotid artery 40-59% stenosis. 2. Palmar Arch Evaluation - Right radial artery decreased greater than 50 percent with compression and right ulnar remains within normal limits with compression. . Left Doppler waveforms remain within normal limits with radial and ulnar compression. 3. ABI Evaluation - Right ABI appeared normal at rest. Left ABI is suggestive of moderate arterial disease at rest.   12/2016 echo Study Conclusions  - Left ventricle: The cavity size was normal. Wall thickness was normal. Systolic function was vigorous. The estimated ejection fraction was in the range of 65% to 70%. Wall motion was normal; there were no regional wall motion abnormalities. -  Aortic valve: Moderately calcified annulus. Trileaflet; mildly calcified leaflets. There was mild regurgitation. Mean gradient (S): 12 mm Hg. Peak gradient (S): 24 mm Hg. Valve area (VTI): 2.44 cm^2. - Mitral valve: Mildly calcified annulus. There was trivial regurgitation. - Left atrium: The atrium was mildly dilated. - Right atrium: Central venous pressure (est): 3 mm Hg. - Atrial septum: No defect or patent foramen ovale was identified. - Tricuspid valve: There was trivial regurgitation. - Pulmonary arteries: PA peak pressure: 28 mm Hg (S). - Pericardium, extracardiac: There was no pericardial effusion.  Impressions:  - Normal LV wall thickness with LVEF 65-70% and grade 2 diastolic dysfunction. Mild left atrial enlargement. Mildly calcified mitral annulus with trivial mitral regurgitation. Moderately sclerotic aortic valve with mild aortic regurgitation. Trivial tricuspid regurgitation with PASP estimated 28 mmHg.   02/2017 nuclear stress  There was no ST segment deviation noted during stress.  Findings consistent with prior moderate inferior/inferolateral myocardial infarction with mild to moderate peri-infarct ischemia.  The left ventricular ejection fraction is normal (55-65%).  Low to intermediate risk study  07/02/17 cath [facility] Findings: 1. Severe native CAD with occlusion of the LAD at the mid-portion and 80%  proximal RCA disease 2. Patent LIMA-LAD, LRA graft-distal RCA, SVG-OM/D1.  3. Occlusion of the RIMA mid-graft, though this was semi-engaged.  4. Diffuse plaque and acute on chronic occlusion of the SVG-D2. This is  the likely culprit vessel forhe NSTEMI but SVG is do severely diseased,  that percutaneous intervention is likely not beneficial. 5. Chronic occlusion of SVG-rPL.  Recommendations: 1. Medical Management of complex CAD and NSTEMI.  06/2017 echo [facility] Ultrasound enhancing agent utilized to improve endocardial border   definition  Left ventricular hypertrophy - mild  Normal left ventricular systolic function, ejection fraction 65 to 70%  Segmental wall motion abnormality - (inferior / posterior)  Diastolic dysfunction - grade  II (elevated filling pressures)  Degenerative mitral valve disease  Dilated left atrium - moderate to severe  Aortic sclerosis  Normal right ventricular systolic function  Tricuspid regurgitation - mild  Elevated pulmonary artery systolic pressure - mild  Dilated right atrium - mild        Assessment and Plan  1. Chronicdiastolic HF - controlled, continue current meds   2. CAD -recent NSTEMI as described above involving a bypass graft, not amenable to PCI, treated medically at [facility], records reviewed - no recent cardiac symptoms - off DAPT due to recent GI bleed, on just plavix.   3. HTN - elevated in clinic, home numbers have been at goal -defer management to [facility] nephrology who have made some changes recently and have direct access to his frequent blood work    F/u 4 months       Arnoldo Lenis, M.D

## 2017-11-01 NOTE — Patient Instructions (Signed)
Medication Instructions:  Your physician recommends that you continue on your current medications as directed. Please refer to the Current Medication list given to you today.  Labwork: NONE  Testing/Procedures: NONE  Follow-Up: Your physician wants you to follow-up in: 4 MONTHS WITH DR. BRANCH You will receive a reminder letter in the mail two months in advance. If you don't receive a letter, please call our office to schedule the follow-up appointment.  Any Other Special Instructions Will Be Listed Below (If Applicable).  If you need a refill on your cardiac medications before your next appointment, please call your pharmacy.

## 2017-11-09 ENCOUNTER — Encounter: Payer: Self-pay | Admitting: Cardiology

## 2017-11-16 ENCOUNTER — Encounter: Payer: Self-pay | Admitting: Cardiology

## 2018-02-09 ENCOUNTER — Telehealth: Payer: Self-pay | Admitting: Cardiology

## 2018-02-09 NOTE — Telephone Encounter (Signed)
[name] came in in regards to a statement of $60.00. He wanted to know if the co pays could be billed to the [place] . Please call the patient.

## 2018-02-14 NOTE — Telephone Encounter (Signed)
Called patient regarding his question about copay balance.  Left voice mail indicating that we are unable to bill the [place] for his copays.  Asked him to call back if he has further questions.

## 2018-03-07 ENCOUNTER — Ambulatory Visit: Payer: Medicare Other | Admitting: Cardiology

## 2018-03-19 ENCOUNTER — Encounter: Payer: Self-pay | Admitting: Cardiology

## 2018-03-19 ENCOUNTER — Ambulatory Visit (INDEPENDENT_AMBULATORY_CARE_PROVIDER_SITE_OTHER): Payer: No Typology Code available for payment source | Admitting: Cardiology

## 2018-03-19 VITALS — BP 118/58 | HR 64 | Ht 69.0 in | Wt 222.4 lb

## 2018-03-19 DIAGNOSIS — I2510 Atherosclerotic heart disease of native coronary artery without angina pectoris: Secondary | ICD-10-CM

## 2018-03-19 DIAGNOSIS — I10 Essential (primary) hypertension: Secondary | ICD-10-CM

## 2018-03-19 DIAGNOSIS — I5032 Chronic diastolic (congestive) heart failure: Secondary | ICD-10-CM

## 2018-03-19 MED ORDER — FUROSEMIDE 40 MG PO TABS: 40.0000 mg | ORAL_TABLET | Freq: Two times a day (BID) | ORAL | 1 refills | Status: DC

## 2018-03-19 NOTE — Patient Instructions (Addendum)
Medication Instructions:   Your physician has recommended you make the following change in your medication:   Continue furosemide 40 mg by mouth twice daily. May take an additional 20 mg in the morning as needed for weight gain above 217 lbs.  Continue all other medications the same.  Labwork:  NONE  Testing/Procedures:  NONE  Follow-Up:  Your physician recommends that you schedule a follow-up appointment in: 4 months.  Any Other Special Instructions Will Be Listed Below (If Applicable).  If you need a refill on your cardiac medications before your next appointment, please call your pharmacy.

## 2018-03-19 NOTE — Progress Notes (Signed)
Clinical Summary Mr. Koelling is a 72 y.o.male seen today for follow up of the following medical problems.  1.Chronicdiastolic HF - severe edema a few months ago that has now resolved. - swelling did not improve with lower dose of norvasc. Had been on norvasc before without troubles  - denies any recent issues with edema. No significant SOB/DOE.  - weight up 236 in October admission, discharged at 210. Slowly trending up     2. CAD - hx of CABG in Jan 2008 (LIMA-LAD, SVG-D2, SVG-OM, SVG-RPL) - admit [facility]/30/14 [name] with NSTEMI, received DES to SVG-RCA. No LV gram.  - 08/2014 echo from [place] LVEF 60-65%  - had nuclear stress 2 at [facility]. Large moderate lateral defect fixed. 11/2015.  -cath at [facility] 03/18/16. LM 20%, prox LAD 100% CTO, D2 diffuse disease, LCX LIs, OM2 80%, RCA right PL segment 90%. LIMA patent, SVG-OM2 patent, SVG-D2 50% stenosis, SVG-right PL occluded. Recs were for medical management. Fairly stable findings from prior cath however SVG-RCA PL which was previously stented is now occluded.    - admit 09/2016 with NSTEMI, peak troponin 0.43. Cath as reported below. He was referred for repeat CABG - CABG 09/19/16 with RIMA-diag, left radial to distal RCA - echo 09/2016 LVEF 50-55% - has not been on ACE-I due to poor renal function  02/2017 nuclear stress: inferior/inferolateral infarct with mild to moderate ischemia.  -Admitted 06/2017 with NSTEMI to [facility]. Peak trop 10.  06/29/17 cath: chronic occluded LAD, 80% prox RCA Patent LIMA-LAD, LRA graft to distal RCA, SVG-OM/D1. Occluded RIMA graft,diffuse plaque and acute on chronic occlusion of the SVG-D2 thought culprit poor PCI target.Chronic occlusion of SVG-rPL - echo 06/2017 [facility] LVEF 65-70%, grade II diastolicdysfunction - post cath AKI with Cr to 2. Thought due to cath dye, diuresis due to CHF.   - on plavix alone currently due to bleeding on DAPT - no recent chest pain since our last visit.    -  admitted mid October with chest pain and MI. - admitted [place] x 5 days. Treated medically due to prior caths and poor targets. Had some evidence of CHF time. Discharged home. - no recurrent pain.  - during recent admission restarted ASA, had some recurrent black stools.   4. AKI - Cr up to 2 during recent admission at [place] - diuretics were held at discharge  -07/12/17 Cr trended down to 1.86. In 01/2017 he was 1.68 - followed  by nephrology.    5. HTN - compliant with meds    6. Anemia - followed by [facility] hematology - noted on recent labs, [facility]. Recent black stools - he held brillinta due to ongoing decrease in Hgb.   - sent [facility] hospital. hgb was was to 4.1. Transfused 5 units of pRBCs - had EGD was normal. Discharged last Wed  - no recurrent black stools.  - had repeat labswith pcp this week  - reports EGD, colonscopy, and pill endoscopy. He reports some evidence of possible source, was cauterized.  - no recurrence. Reports Hgb 9.8 2 weeks ago. Off ASA, remains on plavix.    7. Hyperkalemia - admitted again later in October after labs due to K of 6.5 Had been on KCl replacement at the time - admitted to [facility], at discharge 4.8 this past Friday. Has repeat labs for tomorrow.     Past Medical History:  Diagnosis Date  . Acute myocardial  infarction of other inferior wall, initial episode of care   . Anginal pain ([place])   . Anxiety   . Arthritis   . Cancer [facility])    bladder 2013 removed, has cystoscopy 10/2016, has returned x 2  . COPD (chronic obstructive pulmonary disease) ([place])   . Coronary artery disease    Artery bypass graft Jan 2008  . Dyspnea   . Dysrhythmia   . GERD (gastroesophageal reflux disease)   . Heart murmur   . History of hiatal hernia   . Hyperlipidemia   . Hypertension   . Hypothyroidism   . PONV (postoperative nausea and vomiting)   . Sleep apnea   . Stroke ([place])   . Tobacco user       Allergies  Allergen Reactions  . Cardizem Cd [Diltiazem Hcl Er Beads] Palpitations    "Makes heart skip"     Current Outpatient Medications  Medication Sig Dispense Refill  . acetaminophen (TYLENOL) 325 MG tablet Take 2 tablets (650 mg total) by mouth every 6 (six) hours as needed for mild pain.    Marland Kitchen atorvastatin (LIPITOR) 80 MG tablet Take 80 mg by mouth daily.    . clopidogrel (PLAVIX) 75 MG tablet Take 75 mg by mouth daily.    . ergocalciferol (VITAMIN D2) 50000 UNITS capsule Take 50,000 Units by mouth every Sunday.     . ferrous gluconate (FERGON) 324 MG tablet Take 324 mg by mouth 2 (two) times daily with a meal.    . furosemide (LASIX) 40 MG tablet Take 40 mg by mouth 2 (two) times daily.    . hydrALAZINE (APRESOLINE) 100 MG tablet Take 50 mg by mouth 2 (two) times daily.    . isosorbide mononitrate (IMDUR) 120 MG 24 hr tablet Take 120 mg by mouth daily.    Marland Kitchen levothyroxine (SYNTHROID, LEVOTHROID) 50 MCG tablet Take 50 mcg by mouth daily before breakfast.    . lisinopril (PRINIVIL,ZESTRIL) 40 MG tablet Take 40 mg by mouth daily.    . magnesium oxide (MAG-OX) 400 MG tablet Take 400 mg by mouth 2 (two) times daily.     . metoprolol succinate (TOPROL-XL) 100 MG 24 hr tablet Take 50 mg by mouth daily. Take with or immediately following a meal.    . nitroGLYCERIN (NITROSTAT) 0.4 MG SL tablet Place 0.4 mg under the tongue every 5 (five) minutes as needed for chest pain.    . Omega-3 Fatty Acids (RA FISH OIL) 1000 MG CAPS Take 2,000 mg by mouth 2 (two) times daily.     Marland Kitchen omeprazole (PRILOSEC) 20 MG capsule Take 40 mg by mouth 2 (two) times daily.     . potassium chloride SA (K-DUR,KLOR-CON) 20 MEQ tablet Take 20 mEq by mouth 2 (two) times daily.    Marland Kitchen PRESCRIPTION MEDICATION 1 each. Cortisone Injection into each knee every 4 months.    Marland Kitchen spironolactone (ALDACTONE) 50 MG tablet Take 100 mg by mouth every morning.     No current facility-administered medications for this visit.       Past Surgical History:  Procedure Laterality Date  . APPENDECTOMY    . CARDIAC CATHETERIZATION    . CORONARY ARTERY BYPASS GRAFT  06/14/2006   x 5  . CORONARY ARTERY BYPASS GRAFT N/A 09/19/2016   Procedure: REDO CORONARY ARTERY BYPASS GRAFTING (CABG) x two, using right internal mammary artery and left radial artery;  Surgeon: [name], MD;  Location: [place];  Service: Open Heart Surgery;  Laterality: N/A;  .  FOOT SURGERY    . HERNIA REPAIR    . LEFT HEART CATH AND CORS/GRAFTS ANGIOGRAPHY N/A 09/14/2016   Procedure: Left Heart Cath and Cors/Grafts Angiography;  Surgeon: Troy Sine, MD;  Location: [place] CV LAB;  Service: Cardiovascular;  Laterality: N/A;  . LEFT HEART CATHETERIZATION WITH CORONARY/GRAFT ANGIOGRAM N/A 01/31/2014   Procedure: LEFT HEART CATHETERIZATION WITH Beatrix Fetters;  Surgeon: Sanda Klein, MD;  Location: [place] CATH LAB;  Service: Cardiovascular;  Laterality: N/A;  . NOSE SURGERY    . PERCUTANEOUS CORONARY STENT INTERVENTION (PCI-S)  01/31/2014   Procedure: PERCUTANEOUS CORONARY STENT INTERVENTION (PCI-S);  Surgeon: Sanda Klein, MD;  Location: [facility] CATH LAB;  Service: Cardiovascular;;  . RADIAL ARTERY HARVEST Left 09/19/2016   Procedure: RADIAL ARTERY HARVEST;  Surgeon: [name], MD;  Location: [place];  Service: Open Heart Surgery;  Laterality: Left;  . TEE WITHOUT CARDIOVERSION N/A 09/19/2016   Procedure: TRANSESOPHAGEAL ECHOCARDIOGRAM (TEE);  Surgeon: [name], MD;  Location: [place];  Service: Open Heart Surgery;  Laterality: N/A;     Allergies  Allergen Reactions  . Cardizem Cd [Diltiazem Hcl Er Beads] Palpitations    "Makes heart skip"      Family History  Problem Relation Age of Onset  . Heart attack Mother   . Heart attack Brother      Social History Mr. Valli reports that he quit smoking about 18 months ago. His smoking use included cigarettes. He started smoking about 59 years ago. He has a 40.00 pack-year smoking  history. He has never used smokeless tobacco. Mr. Potts reports that he does not drink alcohol.   Review of Systems CONSTITUTIONAL: No weight loss, fever, chills, weakness or fatigue.  HEENT: Eyes: No visual loss, blurred vision, double vision or yellow sclerae.No hearing loss, sneezing, congestion, runny nose or sore throat.  SKIN: No rash or itching.  CARDIOVASCULAR: per hpi RESPIRATORY: No shortness of breath, cough or sputum.  GASTROINTESTINAL: No anorexia, nausea, vomiting or diarrhea. No abdominal pain or blood.  GENITOURINARY: No burning on urination, no polyuria NEUROLOGICAL: No headache, dizziness, syncope, paralysis, ataxia, numbness or tingling in the extremities. No change in bowel or bladder control.  MUSCULOSKELETAL: No muscle, back pain, joint pain or stiffness.  LYMPHATICS: No enlarged nodes. No history of splenectomy.  PSYCHIATRIC: No history of depression or anxiety.  ENDOCRINOLOGIC: No reports of sweating, cold or heat intolerance. No polyuria or polydipsia.  Marland Kitchen   Physical Examination Vitals:   03/19/18 1339  BP: (!) 118/58  Pulse: 64  SpO2: 97%   Vitals:   03/19/18 1339  Weight: 222 lb 6.4 oz (100.9 kg)  Height: 5\' 9"  (1.753 m)    Gen: resting comfortably, no acute distress HEENT: no scleral icterus, pupils equal round and reactive, no palptable cervical adenopathy,  CV: RRR, no mr/g, no jvd Resp: Clear to auscultation bilaterally GI: abdomen is soft, non-tender, non-distended, normal bowel sounds, no hepatosplenomegaly MSK: extremities are warm, no edema.  Skin: warm, no rash Neuro:  no focal deficits Psych: appropriate affect   Diagnostic Studies 01/31/14 Cath Angiographic Findings: 1. The left main coronary arteryis not stenotic, but exhibits significant atherosclerosis and bifurcates in the usual fashion into the left anterior descending artery and left circumflex coronary artery.  2. The left anterior descending arteryis a large vessel that  reaches the apex and generates several proximal septals, but is occluded before any of the major diagonal branches. There is evidence of extensive luminal irregularities and mild calcification.  3. The left circumflex coronary arteryis a medium-size vessel non dominant vessel that generates two major oblique marginal arteries. There is evidence of extensive luminal irregularities and mild calcification. No hemodynamically meaningful stenoses are seen. The first OM is flush occluded. The distal OM is medium in size.  4. The right coronary arteryis a very large-size dominant vessel that generates a posterior lateral ventricular artery that is proximally occluded as well as a diffusely diseased PDA. There is evidence of extensive luminal irregularities and mild calcification.  5. The sequential SVG to the diagonal and first OMis very irregular in caliber, but widely patent, without stenoses. The first diagonal artery is severely diseased and small in caliber. The OM [place] is large and free of major stenoses.  6. The SVG to the second diagonalis widely patent and healthy, feeding a relatively large vessel.  7. The LIMA to the LADis widely patent with both retrograde and antegrade LAD flow. There are significant stenoses both upstream and downstream of the anastomosis, but there is excellent flow.  8. The SVG to RCA-PLA has a severe, ulcerated stenosis in its distal third, at least 95% in severity, but with TIMI 3 flow. It is the culprit lesion.  5. The left ventriclewas not injected.There is no aortic valve stenosis by pullback. The left ventricular end-diastolic pressure is 24 mm Hg.   IMPRESSIONS: High grade unstable stenosis in the SVG to PLA causing NSTEMI  RECOMMENDATION: Urgent PCI, preferably with filter wire.   IMPRESSIONS: 1. Successful PCI of the SVG to right coronary artery graft with a 3.0 x 23 Alpine drug-eluting stent - dilated to 3.25 mm in diameter, using a distal  embolic protection device, 4.0 spider. 2.         04/2014 Carotid US Moderate bilateral stenosis  12/2011 Abdominal US No aortic aneurysm   Jan 2013 [facility] LVEF 65%      08/2014 [facility] LVEF 60-65%, aortic valve sclerosis.   cath at [facility] 03/18/16. LM 20%, prox LAD 100% CTO, D2 diffuse disease, LCX LIs, OM2 80%, RCA right PL segment 90%. LIMA patent, SVG-OM2 patent, SVG-D2 50% stenosis, SVG-right PL occluded. Recs were for medical management.  09/2016 cath  The left ventricular ejection fraction is 45-50% by visual estimate.  LV end diastolic pressure is normal.  There is mild left ventricular systolic dysfunction.  Dist RCA lesion, 90 %stenosed.  SVG.  Dist Graft-1 lesion, 95 %stenosed.  Dist Graft-2 lesion, 50 %stenosed.  Prox Graft lesion, 80 %stenosed.  Ost 2nd Mrg lesion, 100 %stenosed.  SVG.  Mid Graft lesion, 100 %stenosed.  Prox Graft lesion, 100 %stenosed.  Seq SVG-.  Origin lesion, 99 %stenosed.  Prox LAD lesion, 100 %stenosed.  Prox Graft to Dist Graft lesion, 25 %stenosed.  Ost LAD to Prox LAD lesion, 80 %stenosed.  Multivessel native CAD with diffuse 80% proximal LAD stenosis and occlusion of the LAD after a small first diagonal vessel; occluded marginal Aleysia Oltmann arising from the mid AV groove circumflex with an otherwise patent AV groove circumflex extending distally; RCA with 20% mid narrowing normal PDA, but with 90% PLA stenosis.  Patent LIMA graft supplying the mid LAD.  SVG sequentially supplying the diagonal-1 and OM vessel with 99% near ostial graft stenosis and diffuse luminal irregularity in the body of the graft prior to the initial anastomosis.  SVG supplying the diagonal 2 vessel with diffuse proximal to mid graft stenosis, 95% ulcerated plaque stenosis in the distal third of the graft and 50% eccentric stenosis prior to  its anastomosis.  SVG which had previously supplied the PLA and had been stented, is occluded  proximally.  Mild-to-moderate LV dysfunction with mid distal anterolateral hypocontractility and an ejection fraction of 45-50%.  RECOMMENDATION: The patient had previously undergone CABG surgery by Dr. Cyndia Bent in 2008. Dr. Cyndia Bent will be re-consulted for evaluation and consideration of possible redo CABG revascularization surgery.   09/2016 echo Study Conclusions  - Left ventricle: The cavity size was normal. Wall thickness was increased in a pattern of mild LVH. Systolic function was normal. The estimated ejection fraction was in the range of 50% to 55%. Abnormal GLPSS at -15%, inferoseptal strain abnormality. Basal to mid inferior/inferoseptal hypokinesis. Doppler parameters are consistent with abnormal left ventricular relaxation (grade 1 diastolic dysfunction). The E/e&' ratio is between 8-15, suggesting indeterminate LV filling pressure. - Aortic valve: Poorly visualized. Mild stenosis. Mildly calcified leaflets. There was mild regurgitation. Mean gradient (S): 13 mm Hg. Peak gradient (S): 28 mm Hg. - Left atrium: The atrium was normal in size. - Inferior vena cava: The vessel was normal in size. The respirophasic diameter changes were in the normal range (>= 50%), consistent with normal central venous pressure.  Impressions:  - LVEF 50-55%, mild LVH, basal to mid inferior, inferoseptal hypokinesis, abnormal GLPSS at -15%, grade 1 DD with indeterminate LV filling pressure, mild calcific aortic stenosis - mean gradient of 13 mmHg, normal LA size, normal IVC.  09/2016 Carotid US/ABI preCABG Summary:  1. Carotid Duplex Evaluation - Right internal carotid artery 1-39% stenosis, left internal carotid artery 40-59% stenosis. 2. Palmar Arch Evaluation - Right radial artery decreased greater than 50 percent with compression and right ulnar remains within normal limits with compression. . Left Doppler waveforms remain within normal  limits with radial and ulnar compression. 3. ABI Evaluation - Right ABI appeared normal at rest. Left ABI is suggestive of moderate arterial disease at rest.   12/2016 echo Study Conclusions  - Left ventricle: The cavity size was normal. Wall thickness was normal. Systolic function was vigorous. The estimated ejection fraction was in the range of 65% to 70%. Wall motion was normal; there were no regional wall motion abnormalities. - Aortic valve: Moderately calcified annulus. Trileaflet; mildly calcified leaflets. There was mild regurgitation. Mean gradient (S): 12 mm Hg. Peak gradient (S): 24 mm Hg. Valve area (VTI): 2.44 cm^2. - Mitral valve: Mildly calcified annulus. There was trivial regurgitation. - Left atrium: The atrium was mildly dilated. - Right atrium: Central venous pressure (est): 3 mm Hg. - Atrial septum: No defect or patent foramen ovale was identified. - Tricuspid valve: There was trivial regurgitation. - Pulmonary arteries: PA peak pressure: 28 mm Hg (S). - Pericardium, extracardiac: There was no pericardial effusion.  Impressions:  - Normal LV wall thickness with LVEF 65-70% and grade 2 diastolic dysfunction. Mild left atrial enlargement. Mildly calcified mitral annulus with trivial mitral regurgitation. Moderately sclerotic aortic valve with mild aortic regurgitation. Trivial tricuspid regurgitation with PASP estimated 28 mmHg.   02/2017 nuclear stress  There was no ST segment deviation noted during stress.  Findings consistent with prior moderate inferior/inferolateral myocardial infarction with mild to moderate peri-infarct ischemia.  The left ventricular ejection fraction is normal (55-65%).  Low to intermediate risk study  07/02/17 cath [facility] Findings: 1. Severe native CAD with occlusion of the LAD at the mid-portion and 80%  proximal RCA disease 2. Patent LIMA-LAD, LRA graft-distal RCA, SVG-OM/D1.  3. Occlusion of  the RIMA mid-graft, though this was semi-engaged.  4. Diffuse plaque  and acute on chronic occlusion of the SVG-D2. This is  the likely culprit vessel forhe NSTEMI but SVG is do severely diseased,  that percutaneous intervention is likely not beneficial. 5. Chronic occlusion of SVG-rPL.  Recommendations: 1. Medical Management of complex CAD and NSTEMI.  06/2017 echo [facility] Ultrasound enhancing agent utilized to improve endocardial border  definition  Left ventricular hypertrophy - mild  Normal left ventricular systolic function, ejection fraction 65 to 70%  Segmental wall motion abnormality - (inferior / posterior)  Diastolic dysfunction - grade II (elevated filling pressures)  Degenerative mitral valve disease  Dilated left atrium - moderate to severe  Aortic sclerosis  Normal right ventricular systolic function  Tricuspid regurgitation - mild  Elevated pulmonary artery systolic pressure - mild  Dilated right atrium - mild      Assessment and Plan  1. Chronicdiastolic HF - weights trending back up since discharge. Change lasix to 40mg  bid, may take additional 20mg  in AM for weight above 217 lbs   2. CAD -recent NSTEMI as described above involving a bypass graft, not amenable to PCI, treated medically at [facility] - recent admit with chest pain and MI per his report, awaiting records from [facility], managed medically - remains on plavix alone, has GI bleeding on DAPT.    3. HTN -at goal, continue current meds    F/u 4 months   Arnoldo Lenis, M.D.

## 2018-03-20 DIAGNOSIS — I2510 Atherosclerotic heart disease of native coronary artery without angina pectoris: Secondary | ICD-10-CM | POA: Diagnosis not present

## 2018-03-20 DIAGNOSIS — J449 Chronic obstructive pulmonary disease, unspecified: Secondary | ICD-10-CM | POA: Diagnosis not present

## 2018-03-20 DIAGNOSIS — G4737 Central sleep apnea in conditions classified elsewhere: Secondary | ICD-10-CM | POA: Diagnosis not present

## 2018-03-20 DIAGNOSIS — R809 Proteinuria, unspecified: Secondary | ICD-10-CM | POA: Diagnosis not present

## 2018-03-20 DIAGNOSIS — N183 Chronic kidney disease, stage 3 (moderate): Secondary | ICD-10-CM | POA: Diagnosis not present

## 2018-03-27 ENCOUNTER — Encounter: Payer: Self-pay | Admitting: Cardiology

## 2018-05-01 DIAGNOSIS — E876 Hypokalemia: Secondary | ICD-10-CM | POA: Diagnosis not present

## 2018-05-29 DIAGNOSIS — R0602 Shortness of breath: Secondary | ICD-10-CM | POA: Diagnosis not present

## 2018-05-29 DIAGNOSIS — R0600 Dyspnea, unspecified: Secondary | ICD-10-CM | POA: Diagnosis not present

## 2018-05-29 DIAGNOSIS — N183 Chronic kidney disease, stage 3 (moderate): Secondary | ICD-10-CM | POA: Diagnosis not present

## 2018-05-29 DIAGNOSIS — I110 Hypertensive heart disease with heart failure: Secondary | ICD-10-CM | POA: Diagnosis not present

## 2018-05-29 DIAGNOSIS — Z87891 Personal history of nicotine dependence: Secondary | ICD-10-CM | POA: Diagnosis not present

## 2018-05-29 DIAGNOSIS — I509 Heart failure, unspecified: Secondary | ICD-10-CM | POA: Diagnosis not present

## 2018-05-29 DIAGNOSIS — R2241 Localized swelling, mass and lump, right lower limb: Secondary | ICD-10-CM | POA: Diagnosis not present

## 2018-05-29 DIAGNOSIS — I130 Hypertensive heart and chronic kidney disease with heart failure and stage 1 through stage 4 chronic kidney disease, or unspecified chronic kidney disease: Secondary | ICD-10-CM | POA: Diagnosis not present

## 2018-05-29 DIAGNOSIS — Z888 Allergy status to other drugs, medicaments and biological substances status: Secondary | ICD-10-CM | POA: Diagnosis not present

## 2018-05-29 DIAGNOSIS — J449 Chronic obstructive pulmonary disease, unspecified: Secondary | ICD-10-CM | POA: Diagnosis not present

## 2018-06-07 DIAGNOSIS — E785 Hyperlipidemia, unspecified: Secondary | ICD-10-CM | POA: Diagnosis not present

## 2018-06-07 DIAGNOSIS — Z9049 Acquired absence of other specified parts of digestive tract: Secondary | ICD-10-CM | POA: Diagnosis not present

## 2018-06-07 DIAGNOSIS — Z8673 Personal history of transient ischemic attack (TIA), and cerebral infarction without residual deficits: Secondary | ICD-10-CM | POA: Diagnosis not present

## 2018-06-07 DIAGNOSIS — I517 Cardiomegaly: Secondary | ICD-10-CM | POA: Diagnosis not present

## 2018-06-07 DIAGNOSIS — I25790 Atherosclerosis of other coronary artery bypass graft(s) with unstable angina pectoris: Secondary | ICD-10-CM | POA: Diagnosis not present

## 2018-06-07 DIAGNOSIS — D638 Anemia in other chronic diseases classified elsewhere: Secondary | ICD-10-CM | POA: Diagnosis not present

## 2018-06-07 DIAGNOSIS — Z888 Allergy status to other drugs, medicaments and biological substances status: Secondary | ICD-10-CM | POA: Diagnosis not present

## 2018-06-07 DIAGNOSIS — I080 Rheumatic disorders of both mitral and aortic valves: Secondary | ICD-10-CM | POA: Diagnosis not present

## 2018-06-07 DIAGNOSIS — K921 Melena: Secondary | ICD-10-CM | POA: Diagnosis not present

## 2018-06-07 DIAGNOSIS — N183 Chronic kidney disease, stage 3 (moderate): Secondary | ICD-10-CM | POA: Diagnosis not present

## 2018-06-07 DIAGNOSIS — R531 Weakness: Secondary | ICD-10-CM | POA: Diagnosis not present

## 2018-06-07 DIAGNOSIS — R072 Precordial pain: Secondary | ICD-10-CM | POA: Diagnosis not present

## 2018-06-07 DIAGNOSIS — I5032 Chronic diastolic (congestive) heart failure: Secondary | ICD-10-CM | POA: Diagnosis not present

## 2018-06-07 DIAGNOSIS — I130 Hypertensive heart and chronic kidney disease with heart failure and stage 1 through stage 4 chronic kidney disease, or unspecified chronic kidney disease: Secondary | ICD-10-CM | POA: Diagnosis not present

## 2018-06-07 DIAGNOSIS — D62 Acute posthemorrhagic anemia: Secondary | ICD-10-CM | POA: Diagnosis not present

## 2018-06-07 DIAGNOSIS — K3182 Dieulafoy lesion (hemorrhagic) of stomach and duodenum: Secondary | ICD-10-CM | POA: Diagnosis not present

## 2018-06-07 DIAGNOSIS — K922 Gastrointestinal hemorrhage, unspecified: Secondary | ICD-10-CM | POA: Insufficient documentation

## 2018-06-07 DIAGNOSIS — Z951 Presence of aortocoronary bypass graft: Secondary | ICD-10-CM | POA: Diagnosis not present

## 2018-06-07 DIAGNOSIS — I25110 Atherosclerotic heart disease of native coronary artery with unstable angina pectoris: Secondary | ICD-10-CM | POA: Diagnosis not present

## 2018-06-07 DIAGNOSIS — R0602 Shortness of breath: Secondary | ICD-10-CM | POA: Diagnosis not present

## 2018-06-07 DIAGNOSIS — Z87891 Personal history of nicotine dependence: Secondary | ICD-10-CM | POA: Diagnosis not present

## 2018-06-07 DIAGNOSIS — R7989 Other specified abnormal findings of blood chemistry: Secondary | ICD-10-CM | POA: Diagnosis not present

## 2018-06-07 DIAGNOSIS — Z7902 Long term (current) use of antithrombotics/antiplatelets: Secondary | ICD-10-CM | POA: Diagnosis not present

## 2018-06-07 DIAGNOSIS — K31811 Angiodysplasia of stomach and duodenum with bleeding: Secondary | ICD-10-CM | POA: Diagnosis not present

## 2018-06-07 DIAGNOSIS — Z79899 Other long term (current) drug therapy: Secondary | ICD-10-CM | POA: Diagnosis not present

## 2018-06-08 DIAGNOSIS — R778 Other specified abnormalities of plasma proteins: Secondary | ICD-10-CM | POA: Insufficient documentation

## 2018-06-08 DIAGNOSIS — D631 Anemia in chronic kidney disease: Secondary | ICD-10-CM | POA: Insufficient documentation

## 2018-06-08 DIAGNOSIS — N400 Enlarged prostate without lower urinary tract symptoms: Secondary | ICD-10-CM | POA: Insufficient documentation

## 2018-06-08 DIAGNOSIS — D62 Acute posthemorrhagic anemia: Secondary | ICD-10-CM | POA: Insufficient documentation

## 2018-06-08 DIAGNOSIS — I25110 Atherosclerotic heart disease of native coronary artery with unstable angina pectoris: Secondary | ICD-10-CM | POA: Insufficient documentation

## 2018-06-11 ENCOUNTER — Telehealth: Payer: Self-pay | Admitting: Cardiology

## 2018-06-11 MED ORDER — VITAMIN D3 25 MCG (1000 UNIT) PO TABS
1000.00 | ORAL_TABLET | ORAL | Status: DC
Start: 2018-06-12 — End: 2018-06-11

## 2018-06-11 MED ORDER — GENERIC EXTERNAL MEDICATION
4.00 | Status: DC
Start: ? — End: 2018-06-11

## 2018-06-11 MED ORDER — TAMSULOSIN HCL 0.4 MG PO CAPS
0.40 | ORAL_CAPSULE | ORAL | Status: DC
Start: 2018-06-12 — End: 2018-06-11

## 2018-06-11 MED ORDER — SODIUM CHLORIDE 0.9 % IV SOLN
10.00 | INTRAVENOUS | Status: DC
Start: ? — End: 2018-06-11

## 2018-06-11 MED ORDER — FINASTERIDE 5 MG PO TABS
5.00 | ORAL_TABLET | ORAL | Status: DC
Start: 2018-06-12 — End: 2018-06-11

## 2018-06-11 MED ORDER — FUROSEMIDE 40 MG PO TABS
40.00 | ORAL_TABLET | ORAL | Status: DC
Start: 2018-06-11 — End: 2018-06-11

## 2018-06-11 MED ORDER — FOLIC ACID 1 MG PO TABS
1.00 | ORAL_TABLET | ORAL | Status: DC
Start: 2018-06-12 — End: 2018-06-11

## 2018-06-11 MED ORDER — LEVOTHYROXINE SODIUM 50 MCG PO TABS
50.00 | ORAL_TABLET | ORAL | Status: DC
Start: 2018-06-12 — End: 2018-06-11

## 2018-06-11 MED ORDER — NITROGLYCERIN 0.4 MG SL SUBL
0.40 | SUBLINGUAL_TABLET | SUBLINGUAL | Status: DC
Start: ? — End: 2018-06-11

## 2018-06-11 MED ORDER — METOPROLOL TARTRATE 25 MG PO TABS
25.00 | ORAL_TABLET | ORAL | Status: DC
Start: 2018-06-12 — End: 2018-06-11

## 2018-06-11 MED ORDER — AMLODIPINE BESYLATE 10 MG PO TABS
10.00 | ORAL_TABLET | ORAL | Status: DC
Start: 2018-06-12 — End: 2018-06-11

## 2018-06-11 MED ORDER — GENERIC EXTERNAL MEDICATION
10.00 | Status: DC
Start: ? — End: 2018-06-11

## 2018-06-11 MED ORDER — GENERIC EXTERNAL MEDICATION
650.00 | Status: DC
Start: ? — End: 2018-06-11

## 2018-06-11 MED ORDER — ATORVASTATIN CALCIUM 80 MG PO TABS
80.00 | ORAL_TABLET | ORAL | Status: DC
Start: 2018-06-11 — End: 2018-06-11

## 2018-06-11 MED ORDER — PANTOPRAZOLE SODIUM 40 MG IV SOLR
40.00 | INTRAVENOUS | Status: DC
Start: 2018-06-11 — End: 2018-06-11

## 2018-06-11 MED ORDER — ACETAMINOPHEN 325 MG PO TABS
650.00 | ORAL_TABLET | ORAL | Status: DC
Start: ? — End: 2018-06-11

## 2018-06-11 MED ORDER — HYDRALAZINE HCL 25 MG PO TABS
100.00 | ORAL_TABLET | ORAL | Status: DC
Start: 2018-06-11 — End: 2018-06-11

## 2018-06-11 MED ORDER — ALUM & MAG HYDROXIDE-SIMETH 200-200-20 MG/5ML PO SUSP
30.00 | ORAL | Status: DC
Start: ? — End: 2018-06-11

## 2018-06-11 MED ORDER — ISOSORBIDE DINITRATE 10 MG PO TABS
40.00 | ORAL_TABLET | ORAL | Status: DC
Start: 2018-06-11 — End: 2018-06-11

## 2018-06-11 NOTE — Telephone Encounter (Signed)
Continue there recommended med changes, can he see me in 3 weeks   Zandra Abts MD

## 2018-06-11 NOTE — Telephone Encounter (Signed)
Just discharged from [facility] in [place] this morning.  He would like to run by Dr Harl Bowie what changes they are wanting to make with medications

## 2018-06-11 NOTE — Telephone Encounter (Signed)
Patient informed via vm. Awaiting call back to schedule 3 week f/u visit.

## 2018-06-11 NOTE — Telephone Encounter (Signed)
Patient was recently discharged from [facility] for GI bleed and was told to stop plavix and take aspirin 81 mg daily in the place of it. Also was told to stop toprol xl and imdur. Patient informed that he should follow the discharge instructions at this time and this message would be sent to his provider an that access to his records from [place] are available in care everywhere. Verbalized understanding.

## 2018-06-12 NOTE — Telephone Encounter (Signed)
Patient called back and verbalized understanding. Patient said that he didn't want to wait 3 weeks to be seen because his BP today was 185/94. Patient given appt to be seen on 06/15/2018 @1 :20 pm with Dr. Harl Bowie at the [place] office.

## 2018-06-15 ENCOUNTER — Ambulatory Visit (INDEPENDENT_AMBULATORY_CARE_PROVIDER_SITE_OTHER): Payer: No Typology Code available for payment source | Admitting: Cardiology

## 2018-06-15 ENCOUNTER — Encounter: Payer: Self-pay | Admitting: Cardiology

## 2018-06-15 VITALS — BP 140/60 | HR 57 | Ht 69.0 in | Wt 224.8 lb

## 2018-06-15 DIAGNOSIS — I2510 Atherosclerotic heart disease of native coronary artery without angina pectoris: Secondary | ICD-10-CM | POA: Diagnosis not present

## 2018-06-15 DIAGNOSIS — I5032 Chronic diastolic (congestive) heart failure: Secondary | ICD-10-CM

## 2018-06-15 DIAGNOSIS — I10 Essential (primary) hypertension: Secondary | ICD-10-CM | POA: Diagnosis not present

## 2018-06-15 DIAGNOSIS — D509 Iron deficiency anemia, unspecified: Secondary | ICD-10-CM

## 2018-06-15 NOTE — Patient Instructions (Signed)
Medication Instructions:  Your physician has recommended you make the following change in your medication:  Restart Plavix 75 mg Daily  Restart Toprol XL 25 mg Daily   Call office on Monday with Blood Pressure and Labs.   If you need a refill on your cardiac medications before your next appointment, please call your pharmacy.   Lab work: NONE   If you have labs (blood work) drawn today and your tests are completely normal, you will receive your results only by: Marland Kitchen MyChart Message (if you have MyChart) OR . A paper copy in the mail If you have any lab test that is abnormal or we need to change your treatment, we will call you to review the results.  Testing/Procedures: NONE   Follow-Up: At [facility], you and your health needs are our priority.  As part of our continuing mission to provide you with exceptional heart care, we have created designated Provider Care Teams.  These Care Teams include your primary Cardiologist (physician) and Advanced Practice Providers (APPs -  Physician Assistants and Nurse Practitioners) who all work together to provide you with the care you need, when you need it. You will need a follow up appointment in 1 months.  Please call our office 2 months in advance to schedule this appointment.  You may see Carlyle Dolly, MD or one of the following Advanced Practice Providers on your designated Care Team:   Bernerd Pho, PA-C [facility]) . Ermalinda Barrios, PA-C ([place])  Any Other Special Instructions Will Be Listed Below (If Applicable). Thank you for choosing [facility]!

## 2018-06-15 NOTE — Progress Notes (Signed)
Clinical Summary [name] is a 73 y.o.male seen today for follow up of the following medical problems.  1.Chronicdiastolic HF - ER visit May 29 2018 at [facility] with 4 lbs weight gain and SOB - he had increaed his lasix from 40mg  bid to 60mg  in AM and 40mg  in PM x 1 week prior to coming to ER.  - during that ER visit K 4, Cr 1.5 Hgb 8.4 - normal CXR. LE venous US negative for DVT.   - reports no significant edema , no significnat SOB    2. CAD - hx of CABG in Jan 2008 (LIMA-LAD, SVG-D2, SVG-OM, SVG-RPL) - admit 01/30/14 [name] with NSTEMI, received DES to SVG-RCA. No LV gram.  - 08/2014 echo from [place] LVEF 60-65%  - had nuclear stress 2 at [facility]. Large moderate lateral defect fixed. 11/2015.  -cath at [facility] 03/18/16. LM 20%, prox LAD 100% CTO, D2 diffuse disease, LCX LIs, OM2 80%, RCA right PL segment 90%. LIMA patent, SVG-OM2 patent, SVG-D2 50% stenosis, SVG-right PL occluded. Recs were for medical management. Fairly stable findings from prior cath however SVG-RCA PL which was previously stented is now occluded.    - admit 09/2016 with NSTEMI, peak troponin 0.43. Cath as reported below. He was referred for repeat CABG - CABG 09/19/16 with RIMA-diag, left radial to distal RCA - echo 09/2016 LVEF 50-55% - has not been on ACE-I due to poor renal function  02/2017 nuclear stress: inferior/inferolateral infarct with mild to moderate ischemia.  -Admitted 06/2017 with NSTEMI to [facility]. Peak trop 10.  06/29/17 cath: chronic occluded LAD, 80% prox RCA Patent LIMA-LAD, LRA graft to distal RCA, SVG-OM/D1. Occluded RIMA graft,diffuse plaque and acute on chronic occlusion of the SVG-D2 thought culprit poor PCI target.Chronic occlusion of SVG-rPL - echo 06/2017 [facility] LVEF 65-70%, grade II diastolicdysfunction - post cath AKI with Cr to 2. Thought due to cath dye, diuresis due to CHF.  - had been on just plavix post MI due to prior GI bleeds.  - admitted mid October with chest  pain and MI. - admitted [facility] x 5 days. Treated medically due to prior caths and poor targets. Had some evidence of CHF time. Discharged home. - no recurrent pain.   - during recent admission with GI bleed he was restarted ASA and plavix was stop, had some recurrent black stools.   4. CKD - followed by neprology   5. HTN - compliant with meds  - during Jan 2020 admit metoprolol XL 50 and imdur 120 stopped - home bp's initially elevated 180s. Now 140s to 150s    6. Anemia - followed by [facility] hematology - noted on recent labs, [facility]. Recent black stools - he held brillinta due to ongoing decrease in Hgb.   - sent [facility] hospital. hgb was was to 4.1. Transfused 5 units of pRBCs - had EGD was normal. Discharged last Wed  - no recurrent black stools.  - had repeat labswith pcp this week  - reports EGD, colonscopy, and pill endoscopy. He reports some evidence of possible source, was cauterized.  - no recurrence. Reports Hgb 9.8 2 weeks ago. Off ASA, remains on plavix.  Jan 2020 Hgb down to 7.1, dark stools. Sent to er ER - plavix was stopped. Started on ASA - had EGD Jan 24,2020 with actively bleeding duodenal ulcer, it was cauterized - did receive 2 unit of pRBCs. Discharge Hgb 8.7  - no  recurrent bleeding. Repeat labs at [facility] in [place]. -     7. Hyperkalemia - admitted again later in October after labs due to K of 6.5 Had been on KCl replacement at the time - admitted to [facility], at discharge 4.8 this past Friday. Has repeat labs for tomorrow.   - ER visit 04/2018 with low K and Mg at [facility] K 3.5, Mg 0.7 Jan 14 ER visit K 4   Past Medical History:  Diagnosis Date  . Acute myocardial infarction of other inferior wall, initial episode of care   . Anginal pain ([place])   . Anxiety   . Arthritis   . Cancer [facility])    bladder 2013 removed, has cystoscopy 10/2016, has returned x 2  . COPD (chronic obstructive pulmonary  disease) ([place])   . Coronary artery disease    Artery bypass graft Jan 2008  . Dyspnea   . Dysrhythmia   . GERD (gastroesophageal reflux disease)   . Heart murmur   . History of hiatal hernia   . Hyperlipidemia   . Hypertension   . Hypothyroidism   . PONV (postoperative nausea and vomiting)   . Sleep apnea   . Stroke ([place])   . Tobacco user      Allergies  Allergen Reactions  . Cardizem Cd [Diltiazem Hcl Er Beads] Palpitations    "Makes heart skip"     Current Outpatient Medications  Medication Sig Dispense Refill  . acetaminophen (TYLENOL) 325 MG tablet Take 2 tablets (650 mg total) by mouth every 6 (six) hours as needed for mild pain.    Marland Kitchen amLODipine (NORVASC) 10 MG tablet Take 10 mg by mouth daily.    Marland Kitchen atorvastatin (LIPITOR) 80 MG tablet Take 80 mg by mouth daily.    . clopidogrel (PLAVIX) 75 MG tablet Take 75 mg by mouth daily.    . ergocalciferol (VITAMIN D2) 50000 UNITS capsule Take 50,000 Units by mouth every Sunday.     . finasteride (PROSCAR) 5 MG tablet Take 5 mg by mouth daily.    . furosemide (LASIX) 40 MG tablet Take 1 tablet (40 mg total) by mouth 2 (two) times daily. May take an additional 20 mg (1/2 tablet) in the mornings for weight above 217 lbs as needed. 135 tablet 1  . hydrALAZINE (APRESOLINE) 100 MG tablet Take 100 mg by mouth 3 (three) times daily.     . isosorbide mononitrate (IMDUR) 120 MG 24 hr tablet Take 120 mg by mouth daily.    Marland Kitchen levothyroxine (SYNTHROID, LEVOTHROID) 50 MCG tablet Take 50 mcg by mouth daily before breakfast.    . magnesium oxide (MAG-OX) 400 MG tablet Take 400 mg by mouth daily. Stopping this Friday    . metoprolol succinate (TOPROL-XL) 50 MG 24 hr tablet Take 25 mg by mouth daily. Take with or immediately following a meal.    . nitroGLYCERIN (NITROSTAT) 0.4 MG SL tablet Place 0.4 mg under the tongue every 5 (five) minutes as needed for chest pain.    . Omega-3 Fatty Acids (RA FISH OIL) 1000 MG CAPS Take 2,000 mg by mouth 2 (two)  times daily.     Marland Kitchen omeprazole (PRILOSEC) 20 MG capsule Take 40 mg by mouth 2 (two) times daily.     . tamsulosin (FLOMAX) 0.4 MG CAPS capsule Take 0.4 mg by mouth daily.     No current facility-administered medications for this visit.      Past Surgical History:  Procedure Laterality Date  . APPENDECTOMY    .  CARDIAC CATHETERIZATION    . CORONARY ARTERY BYPASS GRAFT  06/14/2006   x 5  . CORONARY ARTERY BYPASS GRAFT N/A 09/19/2016   Procedure: REDO CORONARY ARTERY BYPASS GRAFTING (CABG) x two, using right internal mammary artery and left radial artery;  Surgeon: [name], MD;  Location: [place];  Service: Open Heart Surgery;  Laterality: N/A;  . FOOT SURGERY    . HERNIA REPAIR    . LEFT HEART CATH AND CORS/GRAFTS ANGIOGRAPHY N/A 09/14/2016   Procedure: Left Heart Cath and Cors/Grafts Angiography;  Surgeon: Troy Sine, MD;  Location: [place] CV LAB;  Service: Cardiovascular;  Laterality: N/A;  . LEFT HEART CATHETERIZATION WITH CORONARY/GRAFT ANGIOGRAM N/A 01/31/2014   Procedure: LEFT HEART CATHETERIZATION WITH Beatrix Fetters;  Surgeon: Sanda Klein, MD;  Location: [place] CATH LAB;  Service: Cardiovascular;  Laterality: N/A;  . NOSE SURGERY    . PERCUTANEOUS CORONARY STENT INTERVENTION (PCI-S)  01/31/2014   Procedure: PERCUTANEOUS CORONARY STENT INTERVENTION (PCI-S);  Surgeon: Sanda Klein, MD;  Location: [facility] CATH LAB;  Service: Cardiovascular;;  . RADIAL ARTERY HARVEST Left 09/19/2016   Procedure: RADIAL ARTERY HARVEST;  Surgeon: [name], MD;  Location: [place];  Service: Open Heart Surgery;  Laterality: Left;  . TEE WITHOUT CARDIOVERSION N/A 09/19/2016   Procedure: TRANSESOPHAGEAL ECHOCARDIOGRAM (TEE);  Surgeon: [name], MD;  Location: [place];  Service: Open Heart Surgery;  Laterality: N/A;     Allergies  Allergen Reactions  . Cardizem Cd [Diltiazem Hcl Er Beads] Palpitations    "Makes heart skip"      Family History  Problem Relation Age of Onset  .  Heart attack Mother   . Heart attack Brother      Social History Mr. Heffern reports that he quit smoking about 21 months ago. His smoking use included cigarettes. He started smoking about 59 years ago. He has a 40.00 pack-year smoking history. He has never used smokeless tobacco. Mr. Toomey reports no history of alcohol use.   Review of Systems CONSTITUTIONAL: No weight loss, fever, chills, weakness or fatigue.  HEENT: Eyes: No visual loss, blurred vision, double vision or yellow sclerae.No hearing loss, sneezing, congestion, runny nose or sore throat.  SKIN: No rash or itching.  CARDIOVASCULAR: per hpi RESPIRATORY: No shortness of breath, cough or sputum.  GASTROINTESTINAL: No anorexia, nausea, vomiting or diarrhea. No abdominal pain or blood.  GENITOURINARY: No burning on urination, no polyuria NEUROLOGICAL: No headache, dizziness, syncope, paralysis, ataxia, numbness or tingling in the extremities. No change in bowel or bladder control.  MUSCULOSKELETAL: No muscle, back pain, joint pain or stiffness.  LYMPHATICS: No enlarged nodes. No history of splenectomy.  PSYCHIATRIC: No history of depression or anxiety.  ENDOCRINOLOGIC: No reports of sweating, cold or heat intolerance. No polyuria or polydipsia.  Marland Kitchen   Physical Examination Vitals:   06/15/18 1303  BP: 140/60  Pulse: (!) 57  SpO2: 95%   Vitals:   06/15/18 1303  Weight: 224 lb 12.8 oz (102 kg)  Height: 5\' 9"  (1.753 m)    Gen: resting comfortably, no acute distress HEENT: no scleral icterus, pupils equal round and reactive, no palptable cervical adenopathy,  CV: RRR, no m/r/g no jvd Resp: Clear to auscultation bilaterally GI: abdomen is soft, non-tender, non-distended, normal bowel sounds, no hepatosplenomegaly MSK: extremities are warm, no edema.  Skin: warm, no rash Neuro:  no focal deficits Psych: appropriate affect   Diagnostic Studies 01/31/14 Cath Angiographic Findings: 1. The left main coronary  arteryis  not stenotic, but exhibits significant atherosclerosis and bifurcates in the usual fashion into the left anterior descending artery and left circumflex coronary artery.  2. The left anterior descending arteryis a large vessel that reaches the apex and generates several proximal septals, but is occluded before any of the major diagonal branches. There is evidence of extensive luminal irregularities and mild calcification.  3. The left circumflex coronary arteryis a medium-size vessel non dominant vessel that generates two major oblique marginal arteries. There is evidence of extensive luminal irregularities and mild calcification. No hemodynamically meaningful stenoses are seen. The first OM is flush occluded. The distal OM is medium in size.  4. The right coronary arteryis a very large-size dominant vessel that generates a posterior lateral ventricular artery that is proximally occluded as well as a diffusely diseased PDA. There is evidence of extensive luminal irregularities and mild calcification.  5. The sequential SVG to the diagonal and first OMis very irregular in caliber, but widely patent, without stenoses. The first diagonal artery is severely diseased and small in caliber. The OM Alynah Schone is large and free of major stenoses.  6. The SVG to the second diagonalis widely patent and healthy, feeding a relatively large vessel.  7. The LIMA to the LADis widely patent with both retrograde and antegrade LAD flow. There are significant stenoses both upstream and downstream of the anastomosis, but there is excellent flow.  8. The SVG to RCA-PLA has a severe, ulcerated stenosis in its distal third, at least 95% in severity, but with TIMI 3 flow. It is the culprit lesion.  5. The left ventriclewas not injected.There is no aortic valve stenosis by pullback. The left ventricular end-diastolic pressure is 24 mm Hg.   IMPRESSIONS: High grade unstable stenosis in the SVG to PLA causing  NSTEMI  RECOMMENDATION: Urgent PCI, preferably with filter wire.   IMPRESSIONS: 1. Successful PCI of the SVG to right coronary artery graft with a 3.0 x 23 Alpine drug-eluting stent - dilated to 3.25 mm in diameter, using a distal embolic protection device, 4.0 spider. 2.         04/2014 Carotid US Moderate bilateral stenosis  12/2011 Abdominal US No aortic aneurysm   Jan 2013 [facility] LVEF 65%      [facility]/2014 [facility] 60-65%, aortic valve sclerosis.   cath at [facility] 03/18/16. LM 20%, prox LAD 100% CTO, D2 diffuse disease, LCX LIs, OM2 80%, RCA right PL segment 90%. LIMA patent, SVG-OM2 patent, SVG-D2 50% stenosis, SVG-right PL occluded. Recs were for medical management.  09/2016 cath  The left ventricular ejection fraction is 45-50% by visual estimate.  LV end diastolic pressure is normal.  There is mild left ventricular systolic dysfunction.  Dist RCA lesion, 90 %stenosed.  SVG.  Dist Graft-1 lesion, 95 %stenosed.  Dist Graft-2 lesion, 50 %stenosed.  Prox Graft lesion, 80 %stenosed.  Ost 2nd Mrg lesion, 100 %stenosed.  SVG.  Mid Graft lesion, 100 %stenosed.  Prox Graft lesion, 100 %stenosed.  Seq SVG-.  Origin lesion, 99 %stenosed.  Prox LAD lesion, 100 %stenosed.  Prox Graft to Dist Graft lesion, 25 %stenosed.  Ost LAD to Prox LAD lesion, 80 %stenosed.  Multivessel native CAD with diffuse 80% proximal LAD stenosis and occlusion of the LAD after a small first diagonal vessel; occluded marginal Lakayla Barrington arising from the mid AV groove circumflex with an otherwise patent AV groove circumflex extending distally; RCA with 20% mid narrowing normal PDA, but with 90% PLA stenosis.  Patent LIMA graft supplying the  mid LAD.  SVG sequentially supplying the diagonal-1 and OM vessel with 99% near ostial graft stenosis and diffuse luminal irregularity in the body of the graft prior to the initial anastomosis.  SVG supplying the diagonal 2 vessel  with diffuse proximal to mid graft stenosis, 95% ulcerated plaque stenosis in the distal third of the graft and 50% eccentric stenosis prior to its anastomosis.  SVG which had previously supplied the PLA and had been stented, is occluded proximally.  Mild-to-moderate LV dysfunction with mid distal anterolateral hypocontractility and an ejection fraction of 45-50%.  RECOMMENDATION: The patient had previously undergone CABG surgery by Dr. Cyndia Bent in 2008. Dr. Cyndia Bent will be re-consulted for evaluation and consideration of possible redo CABG revascularization surgery.   09/2016 echo Study Conclusions  - Left ventricle: The cavity size was normal. Wall thickness was increased in a pattern of mild LVH. Systolic function was normal. The estimated ejection fraction was in the range of 50% to 55%. Abnormal GLPSS at -15%, inferoseptal strain abnormality. Basal to mid inferior/inferoseptal hypokinesis. Doppler parameters are consistent with abnormal left ventricular relaxation (grade 1 diastolic dysfunction). The E/e&' ratio is between 8-15, suggesting indeterminate LV filling pressure. - Aortic valve: Poorly visualized. Mild stenosis. Mildly calcified leaflets. There was mild regurgitation. Mean gradient (S): 13 mm Hg. Peak gradient (S): 28 mm Hg. - Left atrium: The atrium was normal in size. - Inferior vena cava: The vessel was normal in size. The respirophasic diameter changes were in the normal range (>= 50%), consistent with normal central venous pressure.  Impressions:  - LVEF 50-55%, mild LVH, basal to mid inferior, inferoseptal hypokinesis, abnormal GLPSS at -15%, grade 1 DD with indeterminate LV filling pressure, mild calcific aortic stenosis - mean gradient of 13 mmHg, normal LA size, normal IVC.  09/2016 Carotid US/ABI preCABG Summary:  1. Carotid Duplex Evaluation - Right internal carotid artery 1-39% stenosis, left internal carotid  artery 40-59% stenosis. 2. Palmar Arch Evaluation - Right radial artery decreased greater than 50 percent with compression and right ulnar remains within normal limits with compression. . Left Doppler waveforms remain within normal limits with radial and ulnar compression. 3. ABI Evaluation - Right ABI appeared normal at rest. Left ABI is suggestive of moderate arterial disease at rest.   12/2016 echo Study Conclusions  - Left ventricle: The cavity size was normal. Wall thickness was normal. Systolic function was vigorous. The estimated ejection fraction was in the range of 65% to 70%. Wall motion was normal; there were no regional wall motion abnormalities. - Aortic valve: Moderately calcified annulus. Trileaflet; mildly calcified leaflets. There was mild regurgitation. Mean gradient (S): 12 mm Hg. Peak gradient (S): 24 mm Hg. Valve area (VTI): 2.44 cm^2. - Mitral valve: Mildly calcified annulus. There was trivial regurgitation. - Left atrium: The atrium was mildly dilated. - Right atrium: Central venous pressure (est): 3 mm Hg. - Atrial septum: No defect or patent foramen ovale was identified. - Tricuspid valve: There was trivial regurgitation. - Pulmonary arteries: PA peak pressure: 28 mm Hg (S). - Pericardium, extracardiac: There was no pericardial effusion.  Impressions:  - Normal LV wall thickness with LVEF 65-70% and grade 2 diastolic dysfunction. Mild left atrial enlargement. Mildly calcified mitral annulus with trivial mitral regurgitation. Moderately sclerotic aortic valve with mild aortic regurgitation. Trivial tricuspid regurgitation with PASP estimated 28 mmHg.   02/2017 nuclear stress  There was no ST segment deviation noted during stress.  Findings consistent with prior moderate inferior/inferolateral myocardial infarction with mild to moderate  peri-infarct ischemia.  The left ventricular ejection fraction is normal  (55-65%).  Low to intermediate risk study  07/02/17 cath [facility] Findings: 1. Severe native CAD with occlusion of the LAD at the mid-portion and 80%  proximal RCA disease 2. Patent LIMA-LAD, LRA graft-distal RCA, SVG-OM/D1.  3. Occlusion of the RIMA mid-graft, though this was semi-engaged.  4. Diffuse plaque and acute on chronic occlusion of the SVG-D2. This is  the likely culprit vessel forhe NSTEMI but SVG is do severely diseased,  that percutaneous intervention is likely not beneficial. 5. Chronic occlusion of SVG-rPL.  Recommendations: 1. Medical Management of complex CAD and NSTEMI.  06/2017 echo [facility] Ultrasound enhancing agent utilized to improve endocardial border  definition  Left ventricular hypertrophy - mild  Normal left ventricular systolic function, ejection fraction 65 to 70%  Segmental wall motion abnormality - (inferior / posterior)  Diastolic dysfunction - grade II (elevated filling pressures)  Degenerative mitral valve disease  Dilated left atrium - moderate to severe  Aortic sclerosis  Normal right ventricular systolic function  Tricuspid regurgitation - mild  Elevated pulmonary artery systolic pressure - mild  Dilated right atrium - mild       Assessment and Plan  1. Chronicdiastolic HF -appears euvolemic, conitnue meds   2. CAD - complex extensive history outlined above - during recent admssion with GI bleed he was changed from plavix to aspirin. Would not see any lower risk of Gi bleeding, if anything would suspect likely higher risk given effects on GI tract - d/c aspirin, restart plavix 75mg  daily. If recurent bleeding would have to do without any antiplatelet agent  3. HTN - some of his bp meds stopped during recent admission with GI bleed. BP's elevated today - restart Toprol at 25mg  daily, likely restart his imdur in near future. - he is to call us Monday and update [place] on his bp's  4. Anemia - recent GI bleed - [facility], he is to call and update [place] on his results  5. Hypokalemia/Hypomagnesemia - noted during recent ER visit - he is to call us with his recent labs from the [place] regarding his K and Mg levels on Monday   Extensive outside records reviewed from multiple ER visits and admissions for multiple complex ongoing medical problems, extended visit today 40 minutes      Arnoldo Lenis, M.D

## 2018-06-29 ENCOUNTER — Telehealth: Payer: Self-pay | Admitting: Cardiology

## 2018-06-29 NOTE — Telephone Encounter (Signed)
Patient called stating that Dr. Harl Bowie had requested to let him know update on BP. States that after he takes his BP mediation his BP seems to be fine for 3-4 hours. After that it starts going up again.  Had blood work done 06/26/2018 at the [facility]   - Magnesium 1.1 and Potassium 3.7

## 2018-07-02 ENCOUNTER — Encounter: Payer: Self-pay | Admitting: *Deleted

## 2018-07-02 NOTE — Telephone Encounter (Signed)
Pt voiced understanding - updated medication list - pt says he doesn't need refills at this time

## 2018-07-02 NOTE — Telephone Encounter (Signed)
Lets go ahead and restart his imdur 120 mg daily. We  Will reassess at our f/u in the next few weeks   Zandra Abts MD

## 2018-07-02 NOTE — Telephone Encounter (Signed)
Pt says after he takes his amlodipine, hydralazine, metoprolol in the mornings BP runs 120s-130s/50s-60s HR 60-70s - checks it again after 3pm and remains 150s-160s/60s HR 73U-20U - says systolic remains elevated until the next morning - says this has been going on for "years" - requested lab work from [place] clinic - pt denies any symptoms, has f/u 07/26/18

## 2018-07-26 ENCOUNTER — Other Ambulatory Visit: Payer: Self-pay

## 2018-07-26 ENCOUNTER — Encounter: Payer: Self-pay | Admitting: Cardiology

## 2018-07-26 ENCOUNTER — Ambulatory Visit (INDEPENDENT_AMBULATORY_CARE_PROVIDER_SITE_OTHER): Payer: No Typology Code available for payment source | Admitting: Cardiology

## 2018-07-26 VITALS — BP 145/61 | HR 67 | Ht 69.0 in | Wt 223.8 lb

## 2018-07-26 DIAGNOSIS — I5032 Chronic diastolic (congestive) heart failure: Secondary | ICD-10-CM | POA: Diagnosis not present

## 2018-07-26 DIAGNOSIS — I10 Essential (primary) hypertension: Secondary | ICD-10-CM | POA: Diagnosis not present

## 2018-07-26 DIAGNOSIS — I2510 Atherosclerotic heart disease of native coronary artery without angina pectoris: Secondary | ICD-10-CM

## 2018-07-26 NOTE — Patient Instructions (Signed)
Your physician recommends that you schedule a follow-up appointment in: 3 MONTHS WITH DR BRANCH  Your physician recommends that you continue on your current medications as directed. Please refer to the Current Medication list given to you today.  Thank you for choosing [facility]!!    

## 2018-07-26 NOTE — Progress Notes (Signed)
Clinical Summary [name] is a 73 y.o.male seen today for follow up of the following medical problems.  1.Chronicdiastolic HF - no SOB/DOE. NO edema - home weights stable 222-224 lbs - compliant with meds. Taking lasix 40mg  bid. Limiting sodium intake.  - compliant with meds    2. CAD - hx of CABG in Jan 2008 (LIMA-LAD, SVG-D2, SVG-OM, SVG-RPL) - admit 01/30/14 [name] with NSTEMI, received DES to SVG-RCA. No LV gram.  - 08/2014 echo from [place] LVEF 60-65%  - had nuclear stress 2 at [facility]. Large moderate lateral defect fixed. 11/2015.  -cath at [facility] 03/18/16. LM 20%, prox LAD 100% CTO, D2 diffuse disease, LCX LIs, OM2 80%, RCA right PL segment 90%. LIMA patent, SVG-OM2 patent, SVG-D2 50% stenosis, SVG-right PL occluded. Recs were for medical management. Fairly stable findings from prior cath however SVG-RCA PL which was previously stented is now occluded.    - admit 09/2016 with NSTEMI, peak troponin 0.43. Cath as reported below. He was referred for repeat CABG - CABG 09/19/16 with RIMA-diag, left radial to distal RCA - echo 09/2016 LVEF 50-55% - has not been on ACE-I due to poor renal function  02/2017 nuclear stress: inferior/inferolateral infarct with mild to moderate ischemia.  -Admitted 06/2017 with NSTEMI to [facility]. Peak trop 10.  06/29/17 cath: chronic occluded LAD, 80% prox RCA Patent LIMA-LAD, LRA graft to distal RCA, SVG-OM/D1. Occluded RIMA graft,diffuse plaque and acute on chronic occlusion of the SVG-D2 thought culprit poor PCI target.Chronic occlusion of SVG-rPL - echo 06/2017 [facility] LVEF 65-70%, grade II diastolicdysfunction - post cath AKI with Cr to 2. Thought due to cath dye, diuresis due to CHF.  - had been on just plavix post MI due to prior GI bleeds.  - admitted mid October with chest pain and MI. - admitted [place] x 5 days. Treated medically due to prior caths and poor targets. Had some evidence of CHF time. Discharged home. - no recurrent  pain.    - no recent chest pain.    4. CKD - followed by neprology at [facility] and also private in [place], [place].    5. HTN - home bp's variable. SBPs 170s first thing in AM, after meds trends down to 120s. Toward end of day trends back up to 150s   6. Anemia - followed by [facility] hematology - noted on recent labs, [facility]. Recent black stools - he held brillinta due to ongoing decrease in Hgb.   - sent [name] hospital. hgb was was to 4.1. Transfused 5 units of pRBCs - had EGD was normal. Discharged last Wed  - no recurrent black stools.  - had repeat labswith pcp this week  - reports EGD, colonscopy, and pill endoscopy. He reports some evidence of possible source, was cauterized.  - no recurrence. Reports Hgb 9.8 2 weeks ago. Off ASA, remains on plavix.  Jan 2020 Hgb down to 7.1, dark stools. Sent to er ER - plavix was stopped. Started on ASA - had EGD Jan 24,2020 with actively bleeding duodenal ulcer, it was cauterized - did receive 2 unit of pRBCs. Discharge Hgb 8.7   - no recurrent bleeding. Back on plavix.       Past Medical History:  Diagnosis Date  . Acute myocardial infarction of other inferior wall, initial episode of care   . Anginal pain ([place])   . Anxiety   . Arthritis   . Cancer ([place])  bladder 2013 removed, has cystoscopy 10/2016, has returned x 2  . COPD (chronic obstructive pulmonary disease) ([place])   . Coronary artery disease    Artery bypass graft Jan 2008  . Dyspnea   . Dysrhythmia   . GERD (gastroesophageal reflux disease)   . Heart murmur   . History of hiatal hernia   . Hyperlipidemia   . Hypertension   . Hypothyroidism   . PONV (postoperative nausea and vomiting)   . Sleep apnea   . Stroke ([place])   . Tobacco user      Allergies  Allergen Reactions  . Cardizem Cd [Diltiazem Hcl Er Beads] Palpitations    "Makes heart skip"     Current Outpatient Medications  Medication Sig Dispense Refill  .  acetaminophen (TYLENOL) 325 MG tablet Take 2 tablets (650 mg total) by mouth every 6 (six) hours as needed for mild pain.    Marland Kitchen amLODipine (NORVASC) 10 MG tablet Take 10 mg by mouth daily.    Marland Kitchen atorvastatin (LIPITOR) 80 MG tablet Take 80 mg by mouth daily.    . clopidogrel (PLAVIX) 75 MG tablet Take 75 mg by mouth daily.    . cyanocobalamin (,VITAMIN B-12,) 1000 MCG/ML injection Inject into the muscle.    . finasteride (PROSCAR) 5 MG tablet Take 5 mg by mouth daily.    . folic acid (FOLVITE) 1 MG tablet Take by mouth.    . furosemide (LASIX) 40 MG tablet Take 1 tablet (40 mg total) by mouth 2 (two) times daily. May take an additional 20 mg (1/2 tablet) in the mornings for weight above 217 lbs as needed. 135 tablet 1  . hydrALAZINE (APRESOLINE) 100 MG tablet Take 100 mg by mouth 3 (three) times daily.     . isosorbide mononitrate (IMDUR) 120 MG 24 hr tablet Take 120 mg by mouth daily.    Marland Kitchen levothyroxine (SYNTHROID, LEVOTHROID) 50 MCG tablet Take 50 mcg by mouth daily before breakfast.    . metoprolol succinate (TOPROL-XL) 25 MG 24 hr tablet Take 25 mg by mouth daily.    . nitroGLYCERIN (NITROSTAT) 0.4 MG SL tablet Place 0.4 mg under the tongue every 5 (five) minutes as needed for chest pain.    . Omega-3 Fatty Acids (RA FISH OIL) 1000 MG CAPS Take 2,000 mg by mouth 2 (two) times daily.     Marland Kitchen omeprazole (PRILOSEC) 20 MG capsule Take 40 mg by mouth 2 (two) times daily.     . tamsulosin (FLOMAX) 0.4 MG CAPS capsule Take 0.4 mg by mouth daily.    . Vitamin D, Cholecalciferol, 25 MCG (1000 UT) CAPS Take by mouth.     No current facility-administered medications for this visit.      Past Surgical History:  Procedure Laterality Date  . APPENDECTOMY    . CARDIAC CATHETERIZATION    . CORONARY ARTERY BYPASS GRAFT  06/14/2006   x 5  . CORONARY ARTERY BYPASS GRAFT N/A 09/19/2016   Procedure: REDO CORONARY ARTERY BYPASS GRAFTING (CABG) x two, using right internal mammary artery and left radial artery;   Surgeon: [name], MD;  Location: [place];  Service: Open Heart Surgery;  Laterality: N/A;  . FOOT SURGERY    . HERNIA REPAIR    . LEFT HEART CATH AND CORS/GRAFTS ANGIOGRAPHY N/A 09/14/2016   Procedure: Left Heart Cath and Cors/Grafts Angiography;  Surgeon: Troy Sine, MD;  Location: [place] [facility];  Service: Cardiovascular;  Laterality: N/A;  . LEFT HEART CATHETERIZATION WITH  CORONARY/GRAFT ANGIOGRAM N/A 01/31/2014   Procedure: LEFT HEART CATHETERIZATION WITH Beatrix Fetters;  Surgeon: Sanda Klein, MD;  Location: [place] CATH [facility];  Service: Cardiovascular;  Laterality: N/A;  . NOSE SURGERY    . PERCUTANEOUS CORONARY STENT INTERVENTION (PCI-S)  01/31/2014   Procedure: PERCUTANEOUS CORONARY STENT INTERVENTION (PCI-S);  Surgeon: Sanda Klein, MD;  Location: [facility];  Service: Cardiovascular;;  . RADIAL ARTERY HARVEST Left 09/19/2016   Procedure: RADIAL ARTERY HARVEST;  Surgeon: [name], MD;  Location: [place];  Service: Open Heart Surgery;  Laterality: Left;  . TEE WITHOUT CARDIOVERSION N/A 09/19/2016   Procedure: TRANSESOPHAGEAL ECHOCARDIOGRAM (TEE);  Surgeon: [name], MD;  Location: [place];  Service: Open Heart Surgery;  Laterality: N/A;     Allergies  Allergen Reactions  . Cardizem Cd [Diltiazem Hcl Er Beads] Palpitations    "Makes heart skip"      Family History  Problem Relation Age of Onset  . Heart attack Mother   . Heart attack Brother      Social History Mr. Kluver reports that he quit smoking about 22 months ago. His smoking use included cigarettes. He started smoking about 59 years ago. He has a 40.00 pack-year smoking history. He has never used smokeless tobacco. Mr. Sava reports no history of alcohol use.   Review of Systems CONSTITUTIONAL: No weight loss, fever, chills, weakness or fatigue.  HEENT: Eyes: No visual loss, blurred vision, double vision or yellow sclerae.No hearing loss, sneezing, congestion, runny nose or sore throat.   SKIN: No rash or itching.  CARDIOVASCULAR: per hpi RESPIRATORY: No shortness of breath, cough or sputum.  GASTROINTESTINAL: No anorexia, nausea, vomiting or diarrhea. No abdominal pain or blood.  GENITOURINARY: No burning on urination, no polyuria NEUROLOGICAL: No headache, dizziness, syncope, paralysis, ataxia, numbness or tingling in the extremities. No change in bowel or bladder control.  MUSCULOSKELETAL: No muscle, back pain, joint pain or stiffness.  LYMPHATICS: No enlarged nodes. No history of splenectomy.  PSYCHIATRIC: No history of depression or anxiety.  ENDOCRINOLOGIC: No reports of sweating, cold or heat intolerance. No polyuria or polydipsia.  Marland Kitchen   Physical Examination Today's Vitals   07/26/18 1253  BP: (!) 145/61  Pulse: 67  SpO2: 95%  Weight: 223 lb 12.8 oz (101.5 kg)  Height: 5\' 9"  (1.753 m)   Body mass index is 33.05 kg/m.  Gen: resting comfortably, no acute distress HEENT: no scleral icterus, pupils equal round and reactive, no palptable cervical adenopathy,  CV: RRR, 2/6 systolic murmur rusb, no jvd. +bilateral carotid bruits Resp: Clear to auscultation bilaterally GI: abdomen is soft, non-tender, non-distended, normal bowel sounds, no hepatosplenomegaly MSK: extremities are warm, no edema.  Skin: warm, no rash Neuro:  no focal deficits Psych: appropriate affect   Diagnostic Studies  01/31/14 Cath Angiographic Findings: 1. The left main coronary arteryis not stenotic, but exhibits significant atherosclerosis and bifurcates in the usual fashion into the left anterior descending artery and left circumflex coronary artery.  2. The left anterior descending arteryis a large vessel that reaches the apex and generates several proximal septals, but is occluded before any of the major diagonal branches. There is evidence of extensive luminal irregularities and mild calcification.  3. The left circumflex coronary arteryis a medium-size vessel non dominant  vessel that generates two major oblique marginal arteries. There is evidence of extensive luminal irregularities and mild calcification. No hemodynamically meaningful stenoses are seen. The first OM is flush occluded. The distal OM is medium in size.  4. The right coronary arteryis a very large-size dominant vessel that generates a posterior lateral ventricular artery that is proximally occluded as well as a diffusely diseased PDA. There is evidence of extensive luminal irregularities and mild calcification.  5. The sequential SVG to the diagonal and first OMis very irregular in caliber, but widely patent, without stenoses. The first diagonal artery is severely diseased and small in caliber. The OM  is large and free of major stenoses.  6. The SVG to the second diagonalis widely patent and healthy, feeding a relatively large vessel.  7. The LIMA to the LADis widely patent with both retrograde and antegrade LAD flow. There are significant stenoses both upstream and downstream of the anastomosis, but there is excellent flow.  8. The SVG to RCA-PLA has a severe, ulcerated stenosis in its distal third, at least 95% in severity, but with TIMI 3 flow. It is the culprit lesion.  5. The left ventriclewas not injected.There is no aortic valve stenosis by pullback. The left ventricular end-diastolic pressure is 24 mm Hg.   IMPRESSIONS: High grade unstable stenosis in the SVG to PLA causing NSTEMI  RECOMMENDATION: Urgent PCI, preferably with filter wire.   IMPRESSIONS: 1. Successful PCI of the SVG to right coronary artery graft with a 3.0 x 23 Alpine drug-eluting stent - dilated to 3.25 mm in diameter, using a distal embolic protection device, 4.0 spider. 2.         04/2014 Carotid US Moderate bilateral stenosis  12/2011 Abdominal US No aortic aneurysm   Jan 2013 [facility] LVEF 65%      08/2014 [facility]F 60-65%, aortic valve sclerosis.   cath at [facility]  03/18/16. LM 20%, prox LAD 100% CTO, D2 diffuse disease, LCX LIs, OM2 80%, RCA right PL segment 90%. LIMA patent, SVG-OM2 patent, SVG-D2 50% stenosis, SVG-right PL occluded. Recs were for medical management.  09/2016 cath  The left ventricular ejection fraction is 45-50% by visual estimate.  LV end diastolic pressure is normal.  There is mild left ventricular systolic dysfunction.  Dist RCA lesion, 90 %stenosed.  SVG.  Dist Graft-1 lesion, 95 %stenosed.  Dist Graft-2 lesion, 50 %stenosed.  Prox Graft lesion, 80 %stenosed.  Ost 2nd Mrg lesion, 100 %stenosed.  SVG.  Mid Graft lesion, 100 %stenosed.  Prox Graft lesion, 100 %stenosed.  Seq SVG-.  Origin lesion, 99 %stenosed.  Prox LAD lesion, 100 %stenosed.  Prox Graft to Dist Graft lesion, 25 %stenosed.  Ost LAD to Prox LAD lesion, 80 %stenosed.  Multivessel native CAD with diffuse 80% proximal LAD stenosis and occlusion of the LAD after a small first diagonal vessel; occluded marginal  arising from the mid AV groove circumflex with an otherwise patent AV groove circumflex extending distally; RCA with 20% mid narrowing normal PDA, but with 90% PLA stenosis.  Patent LIMA graft supplying the mid LAD.  SVG sequentially supplying the diagonal-1 and OM vessel with 99% near ostial graft stenosis and diffuse luminal irregularity in the body of the graft prior to the initial anastomosis.  SVG supplying the diagonal 2 vessel with diffuse proximal to mid graft stenosis, 95% ulcerated plaque stenosis in the distal third of the graft and 50% eccentric stenosis prior to its anastomosis.  SVG which had previously supplied the PLA and had been stented, is occluded proximally.  Mild-to-moderate LV dysfunction with mid distal anterolateral hypocontractility and an ejection fraction of 45-50%.  RECOMMENDATION: The patient had previously undergone CABG surgery by Dr. Cyndia Bent in 2008. Dr. Cyndia Bent will  be re-consulted for  evaluation and consideration of possible redo CABG revascularization surgery.   09/2016 echo Study Conclusions  - Left ventricle: The cavity size was normal. Wall thickness was increased in a pattern of mild LVH. Systolic function was normal. The estimated ejection fraction was in the range of 50% to 55%. Abnormal GLPSS at -15%, inferoseptal strain abnormality. Basal to mid inferior/inferoseptal hypokinesis. Doppler parameters are consistent with abnormal left ventricular relaxation (grade 1 diastolic dysfunction). The E/e&' ratio is between 8-15, suggesting indeterminate LV filling pressure. - Aortic valve: Poorly visualized. Mild stenosis. Mildly calcified leaflets. There was mild regurgitation. Mean gradient (S): 13 mm Hg. Peak gradient (S): 28 mm Hg. - Left atrium: The atrium was normal in size. - Inferior vena cava: The vessel was normal in size. The respirophasic diameter changes were in the normal range (>= 50%), consistent with normal central venous pressure.  Impressions:  - LVEF 50-55%, mild LVH, basal to mid inferior, inferoseptal hypokinesis, abnormal GLPSS at -15%, grade 1 DD with indeterminate LV filling pressure, mild calcific aortic stenosis - mean gradient of 13 mmHg, normal LA size, normal IVC.  09/2016 Carotid US/ABI preCABG Summary:  1. Carotid Duplex Evaluation - Right internal carotid artery 1-39% stenosis, left internal carotid artery 40-59% stenosis. 2. Palmar Arch Evaluation - Right radial artery decreased greater than 50 percent with compression and right ulnar remains within normal limits with compression. . Left Doppler waveforms remain within normal limits with radial and ulnar compression. 3. ABI Evaluation - Right ABI appeared normal at rest. Left ABI is suggestive of moderate arterial disease at rest.   12/2016 echo Study Conclusions  - Left ventricle: The cavity size was normal. Wall  thickness was normal. Systolic function was vigorous. The estimated ejection fraction was in the range of 65% to 70%. Wall motion was normal; there were no regional wall motion abnormalities. - Aortic valve: Moderately calcified annulus. Trileaflet; mildly calcified leaflets. There was mild regurgitation. Mean gradient (S): 12 mm Hg. Peak gradient (S): 24 mm Hg. Valve area (VTI): 2.44 cm^2. - Mitral valve: Mildly calcified annulus. There was trivial regurgitation. - Left atrium: The atrium was mildly dilated. - Right atrium: Central venous pressure (est): 3 mm Hg. - Atrial septum: No defect or patent foramen ovale was identified. - Tricuspid valve: There was trivial regurgitation. - Pulmonary arteries: PA peak pressure: 28 mm Hg (S). - Pericardium, extracardiac: There was no pericardial effusion.  Impressions:  - Normal LV wall thickness with LVEF 65-70% and grade 2 diastolic dysfunction. Mild left atrial enlargement. Mildly calcified mitral annulus with trivial mitral regurgitation. Moderately sclerotic aortic valve with mild aortic regurgitation. Trivial tricuspid regurgitation with PASP estimated 28 mmHg.   02/2017 nuclear stress  There was no ST segment deviation noted during stress.  Findings consistent with prior moderate inferior/inferolateral myocardial infarction with mild to moderate peri-infarct ischemia.  The left ventricular ejection fraction is normal (55-65%).  Low to intermediate risk study  07/02/17 cath [facility] Findings: 1. Severe native CAD with occlusion of the LAD at the mid-portion and 80%  proximal RCA disease 2. Patent LIMA-LAD, LRA graft-distal RCA, SVG-OM/D1.  3. Occlusion of the RIMA mid-graft, though this was semi-engaged.  4. Diffuse plaque and acute on chronic occlusion of the SVG-D2. This is  the likely culprit vessel forhe NSTEMI but SVG is do severely diseased,  that percutaneous intervention is likely not  beneficial. 5. Chronic occlusion of SVG-rPL.  Recommendations: 1. Medical Management of complex CAD and NSTEMI.  06/2017 echo  [facility] Ultrasound enhancing agent utilized to improve endocardial border  definition  Left ventricular hypertrophy - mild  Normal left ventricular systolic function, ejection fraction 65 to 70%  Segmental wall motion abnormality - (inferior / posterior)  Diastolic dysfunction - grade II (elevated filling pressures)  Degenerative mitral valve disease  Dilated left atrium - moderate to severe  Aortic sclerosis  Normal right ventricular systolic function  Tricuspid regurgitation - mild  Elevated pulmonary artery systolic pressure - mild  Dilated right atrium - mild     Assessment and Plan  1. Chronicdiastolic HF - he is euovlemic without symptoms, continue current meds   2. CAD - complex extensive history outlined above - no symptoms, continue current meds  3. HTN - bp's up and down at home. Limited medication options given renal dysfunction. Overall accetable bp's for now, hesitant to make changes, this is first time in years at our appointment he has felt well - continue current meds  4. Anemia - recent GI bleed - no recurrence, remains on plavix, continue     Arnoldo Lenis, M.D.

## 2018-09-03 ENCOUNTER — Telehealth: Payer: Self-pay | Admitting: Cardiology

## 2018-09-03 MED ORDER — HYDRALAZINE HCL 50 MG PO TABS: 75.0000 mg | ORAL_TABLET | Freq: Three times a day (TID) | ORAL | 1 refills | Status: DC

## 2018-09-03 NOTE — Telephone Encounter (Signed)
Pt voiced understanding - updated medication list - pt will continue to monitor BP/symptoms and f/u

## 2018-09-03 NOTE — Telephone Encounter (Signed)
Pt c/o of Chest Pain: 1. Are you having CP right now? No  2. Are you experiencing any other symptoms (ex. SOB, nausea, vomiting, sweating)? No  3. How long have you been experiencing CP? Off and on for a week now  4. Is your CP continuous or coming and going? Comes and Goes  5. Have you taken Nitroglycerin? No   Patient states that his BP is running low for approximately the last 5 days.  He thinks that maybe one of his medications is causing the low Bp Issues .

## 2018-09-03 NOTE — Telephone Encounter (Signed)
Can lower hydralazine to 75mg  tid for now, if significant increase in his bp let us know. I would monitor his chest pain, given his arteries he may have some pain from time to time. Unless he had severe symptoms we would not plan on repeating any studies given his multiple recent studies and limitations on interventions for his arteries   Zandra Abts MD

## 2018-09-03 NOTE — Telephone Encounter (Signed)
Pt says for the last week has been having CP rated at 1 out of 10 in pain - says chest pain is "different" in that pt can tell when it feels like indigestion and for the last week has felt different than his normal heartburn pain. Also says BP has been 144-315Q/00Q says diastolic BP is usually in the upper 50s but has remained in the 40s for the last week or so - denies any swelling/SOB/dizziness - says he feels "great" was mainly concerned about his diastolic BP getting to low and if he needs to decrease hydralazine. HR normal in the 60s.

## 2018-10-26 ENCOUNTER — Ambulatory Visit: Payer: Non-veteran care | Admitting: Cardiology

## 2018-11-07 ENCOUNTER — Telehealth: Payer: Self-pay | Admitting: *Deleted

## 2018-11-07 NOTE — Telephone Encounter (Signed)
Called to make sure its okay for him to take prednisone dose pack for 14 days.

## 2018-11-08 NOTE — Telephone Encounter (Signed)
Ok to take. Keep a close on his weight and for any signs of swelling or edema as prednisone can sometimes cause fluid to build up, if notices give [place] a call   Zandra Abts MD

## 2018-11-08 NOTE — Telephone Encounter (Signed)
Patient informed and verbalized understanding

## 2018-11-20 ENCOUNTER — Other Ambulatory Visit: Payer: Self-pay

## 2018-11-20 NOTE — Patient Outreach (Signed)
[facility] Prevent outreach-requested letter.

## 2018-11-21 ENCOUNTER — Telehealth: Payer: Self-pay | Admitting: Cardiology

## 2018-11-21 NOTE — Telephone Encounter (Signed)
Virtual Visit Pre-Appointment Phone Call  "(Name), I am calling you today to discuss your upcoming appointment. We are currently trying to limit exposure to the virus that causes COVID-19 by seeing patients at home rather than in the office."  1. "What is the BEST phone number to call the day of the visit?" - include this in appointment notes  2. Do you have or have access to (through a family member/friend) a smartphone with video capability that we can use for your visit?" a. If yes - list this number in appt notes as cell (if different from BEST phone #) and list the appointment type as a VIDEO visit in appointment notes b. If no - list the appointment type as a PHONE visit in appointment notes  3. Confirm consent - "In the setting of the current Covid19 crisis, you are scheduled for a (phone or video) visit with your provider on (date) at (time).  Just as we do with many in-office visits, in order for you to participate in this visit, we must obtain consent.  If you'd like, I can send this to your mychart (if signed up) or email for you to review.  Otherwise, I can obtain your verbal consent now.  All virtual visits are billed to your insurance company just like a normal visit would be.  By agreeing to a virtual visit, we'd like you to understand that the technology does not allow for your provider to perform an examination, and thus may limit your provider's ability to fully assess your condition. If your provider identifies any concerns that need to be evaluated in person, we will make arrangements to do so.  Finally, though the technology is pretty good, we cannot assure that it will always work on either your or our end, and in the setting of a video visit, we may have to convert it to a phone-only visit.  In either situation, we cannot ensure that we have a secure connection.  Are you willing to proceed?" STAFF: Did the patient verbally acknowledge consent to telehealth visit? Document  YES/NO here: yes  4. Advise patient to be prepared - "Two hours prior to your appointment, go ahead and check your blood pressure, pulse, oxygen saturation, and your weight (if you have the equipment to check those) and write them all down. When your visit starts, your provider will ask you for this information. If you have an Apple Watch or Kardia device, please plan to have heart rate information ready on the day of your appointment. Please have a pen and paper handy nearby the day of the visit as well."  5. Give patient instructions for MyChart download to smartphone OR Doximity/Doxy.me as below if video visit (depending on what platform provider is using)  6. Inform patient they will receive a phone call 15 minutes prior to their appointment time (may be from unknown caller ID) so they should be prepared to answer    TELEPHONE CALL NOTE  [name] has been deemed a candidate for a follow-up tele-health visit to limit community exposure during the Covid-19 pandemic. I spoke with the patient via phone to ensure availability of phone/video source, confirm preferred email & phone number, and discuss instructions and expectations.  I reminded [name] to be prepared with any vital sign and/or heart rhythm information that could potentially be obtained via home monitoring, at the time of his visit. I reminded [name] to expect a phone call prior to  his visit.  [name] 11/21/2018 1:06 PM   INSTRUCTIONS FOR DOWNLOADING THE MYCHART APP TO SMARTPHONE  - The patient must first make sure to have activated MyChart and know their login information - If Apple, go to [employer] and type in MyChart in the search bar and download the app. If Android, ask patient to go to [employer] and type in [place] in the search bar and download the app. The app is free but as with any other app downloads, their phone may require them to verify saved payment information or Apple/Android  password.  - The patient will need to then log into the app with their MyChart username and password, and select [facility] as their healthcare provider to link the account. When it is time for your visit, go to the MyChart app, find appointments, and click Begin Video Visit. Be sure to Select Allow for your device to access the Microphone and Camera for your visit. You will then be connected, and your provider will be with you shortly.  **If they have any issues connecting, or need assistance please contact MyChart service desk [phone])**  **If using a computer, in order to ensure the best quality for their visit they will need to use either of the following Internet Browsers: [employer], or Google Chrome**  IF USING DOXIMITY or DOXY.ME - The patient will receive a link just prior to their visit by text.     FULL LENGTH CONSENT FOR TELE-HEALTH VISIT   I hereby voluntarily request, consent and authorize [facility] and its employed or contracted physicians, physician assistants, nurse practitioners or other licensed health care professionals (the Practitioner), to provide me with telemedicine health care services (the Services") as deemed necessary by the treating Practitioner. I acknowledge and consent to receive the Services by the Practitioner via telemedicine. I understand that the telemedicine visit will involve communicating with the Practitioner through live audiovisual communication technology and the disclosure of certain medical information by electronic transmission. I acknowledge that I have been given the opportunity to request an in-person assessment or other available alternative prior to the telemedicine visit and am voluntarily participating in the telemedicine visit.  I understand that I have the right to withhold or withdraw my consent to the use of telemedicine in the course of my care at any time, without affecting my right to future care or treatment,  and that the Practitioner or I may terminate the telemedicine visit at any time. I understand that I have the right to inspect all information obtained and/or recorded in the course of the telemedicine visit and may receive copies of available information for a reasonable fee.  I understand that some of the potential risks of receiving the Services via telemedicine include:   Delay or interruption in medical evaluation due to technological equipment failure or disruption;  Information transmitted may not be sufficient (e.g. poor resolution of images) to allow for appropriate medical decision making by the Practitioner; and/or   In rare instances, security protocols could fail, causing a breach of personal health information.  Furthermore, I acknowledge that it is my responsibility to provide information about my medical history, conditions and care that is complete and accurate to the best of my ability. I acknowledge that Practitioner's advice, recommendations, and/or decision may be based on factors not within their control, such as incomplete or inaccurate data provided by me or distortions of diagnostic images or specimens that may result from electronic transmissions. I  understand that the practice of medicine is not an exact science and that Practitioner makes no warranties or guarantees regarding treatment outcomes. I acknowledge that I will receive a copy of this consent concurrently upon execution via email to the email address I last provided but may also request a printed copy by calling the office of [facility].    I understand that my insurance will be billed for this visit.   I have read or had this consent read to me.  I understand the contents of this consent, which adequately explains the benefits and risks of the Services being provided via telemedicine.   I have been provided ample opportunity to ask questions regarding this consent and the Services and have had my questions  answered to my satisfaction.  I give my informed consent for the services to be provided through the use of telemedicine in my medical care  By participating in this telemedicine visit I agree to the above.

## 2018-11-26 ENCOUNTER — Telehealth (INDEPENDENT_AMBULATORY_CARE_PROVIDER_SITE_OTHER): Payer: No Typology Code available for payment source | Admitting: Cardiology

## 2018-11-26 ENCOUNTER — Encounter: Payer: Self-pay | Admitting: Cardiology

## 2018-11-26 ENCOUNTER — Encounter: Payer: Self-pay | Admitting: *Deleted

## 2018-11-26 VITALS — BP 160/60 | HR 70 | Ht 69.0 in | Wt 222.9 lb

## 2018-11-26 DIAGNOSIS — D509 Iron deficiency anemia, unspecified: Secondary | ICD-10-CM

## 2018-11-26 DIAGNOSIS — I2510 Atherosclerotic heart disease of native coronary artery without angina pectoris: Secondary | ICD-10-CM

## 2018-11-26 DIAGNOSIS — I5032 Chronic diastolic (congestive) heart failure: Secondary | ICD-10-CM

## 2018-11-26 DIAGNOSIS — I10 Essential (primary) hypertension: Secondary | ICD-10-CM

## 2018-11-26 NOTE — Patient Instructions (Signed)
Medication Instructions:  Continue all current medications.  Labwork: none  Testing/Procedures: Continue all current medications.  Follow-Up: 4 months   Any Other Special Instructions Will Be Listed Below (If Applicable).  If you need a refill on your cardiac medications before your next appointment, please call your pharmacy.

## 2018-11-26 NOTE — Progress Notes (Signed)
Virtual Visit via Telephone Note   This visit type was conducted due to national recommendations for restrictions regarding the COVID-19 Pandemic (e.g. social distancing) in an effort to limit this patient's exposure and mitigate transmission in our community.  Due to his co-morbid illnesses, this patient is at least at moderate risk for complications without adequate follow up.  This format is felt to be most appropriate for this patient at this time.  The patient did not have access to video technology/had technical difficulties with video requiring transitioning to audio format only (telephone).  All issues noted in this document were discussed and addressed.  No physical exam could be performed with this format.  Please refer to the patient's chart for his  consent to telehealth for [facility].   Date:  11/26/2018   ID:  [name], DOB 1945/11/13, MRN [id number]  Patient Location: Home Provider Location: Office  PCP:  [facility]  Cardiologist:  Carlyle Dolly, MD  Electrophysiologist:  None   Evaluation Performed:  Follow-Up Visit  Chief Complaint:  4 month follow up visit  History of Present Illness:    [name] is a 72 y.o. male seen today for follow up of the following medical problems.  1.Chronicdiastolic HF  - no recent SOb/DOE. Weights remains stable 222 lbs.  - compliant with meds, takes lasix 40mg  bid.   2. CAD - hx of CABG in Jan 2008 (LIMA-LAD, SVG-D2, SVG-OM, SVG-RPL) - admit [facility]/14 [name] with NSTEMI, received DES to SVG-RCA. No LV gram.  - 08/2014 echo from [place] LVEF 60-65%  - had nuclear stress 2 at [facility]. Large moderate lateral defect fixed. 11/2015.  -cath at [facility] 03/18/16. LM 20%, prox LAD 100% CTO, D2 diffuse disease, LCX LIs, OM2 80%, RCA right PL segment 90%. LIMA patent, SVG-OM2 patent, SVG-D2 50% stenosis, SVG-right PL occluded. Recs were for medical management. Fairly stable findings from prior cath however SVG-RCA  PL which was previously stented is now occluded.    - admit 09/2016 with NSTEMI, peak troponin 0.43. Cath as reported below. He was referred for repeat CABG - CABG 09/19/16 with RIMA-diag, left radial to distal RCA - echo 09/2016 LVEF 50-55% - has not been on ACE-I due to poor renal function  02/2017 nuclear stress: inferior/inferolateral infarct with mild to moderate ischemia.  -Admitted 06/2017 with NSTEMI to [facility]. Peak trop 10.  06/29/17 cath: chronic occluded LAD, 80% prox RCA Patent LIMA-LAD, LRA graft to distal RCA, SVG-OM/D1. Occluded RIMA graft,diffuse plaque and acute on chronic occlusion of the SVG-D2 thought culprit poor PCI target.Chronic occlusion of SVG-rPL - echo 06/2017 [facility] LVEF 65-70%, grade II diastolicdysfunction - post cath AKI with Cr to 2. Thought due to cath dye, diuresis due to CHF.  - had been on just plavix post MI due to prior GI bleeds. - admitted mid October with chest pain and MI. - admitted [place] x 5 days. Treated medically due to prior caths and poor targets. Had some evidence of CHF time. Discharged home.   - no recent chest pain    4.CKD - followed by neprology at [facility] and also private in [place], [place].  - last Cr 06/2018 was 1.6  5. HTN - some low DBPs in 08/2018 to the 40s, we lowered his hydralazine to 75mg  tid.   - home bp's in AM SBPs 160s before meds. After meds coming 130s/50s.   6. Anemia - followed by [facility]ology - [facility]. hgb was was to 4.1. Transfused 5 units of pRBCs - had EGD was normal. Discharged last Wed  - no recurrent black stools.  - had repeat labswith pcp this week  - reports EGD, colonscopy, and pill endoscopy. He reports some evidence of possible source, was cauterized.  - no recurrence. Reports Hgb 9.8 2 weeks ago. Off ASA, remains on plavix.  Jan 2020 Hgb down to 7.1, dark stools. Sent to er ER - plavix was stopped. Started on ASA - had EGD Jan 24,2020 with actively  bleeding duodenal ulcer, it was cauterized - did receive2unit of pRBCs. Discharge Hgb 8.7    - no recent bleeding.    The patient does not have symptoms concerning for COVID-19 infection (fever, chills, cough, or new shortness of breath).    Past Medical History:  Diagnosis Date  . Acute myocardial infarction of other inferior wall, initial episode of care   . Anginal pain ([place])   . Anxiety   . Arthritis   . Cancer [facility])    bladder 2013 removed, has cystoscopy 10/2016, has returned x 2  . COPD (chronic obstructive pulmonary disease) ([place])   . Coronary artery disease    Artery bypass graft Jan 2008  . Dyspnea   . Dysrhythmia   . GERD (gastroesophageal reflux disease)   . Heart murmur   . History of hiatal hernia   . Hyperlipidemia   . Hypertension   . Hypothyroidism   . PONV (postoperative nausea and vomiting)   . Sleep apnea   . Stroke ([place])   . Tobacco user    Past Surgical History:  Procedure Laterality Date  . APPENDECTOMY    . CARDIAC CATHETERIZATION    . CORONARY ARTERY BYPASS GRAFT  06/14/2006   x 5  . CORONARY ARTERY BYPASS GRAFT N/A 09/19/2016   Procedure: REDO CORONARY ARTERY BYPASS GRAFTING (CABG) x two, using right internal mammary artery and left radial artery;  Surgeon: [name], MD;  Location: [place];  Service: Open Heart Surgery;  Laterality: N/A;  . FOOT SURGERY    . HERNIA REPAIR    . LEFT HEART CATH AND CORS/GRAFTS ANGIOGRAPHY N/A 09/14/2016   Procedure: Left Heart Cath and Cors/Grafts Angiography;  Surgeon: Troy Sine, MD;  Location: [place] CV LAB;  Service: Cardiovascular;  Laterality: N/A;  . LEFT HEART CATHETERIZATION WITH CORONARY/GRAFT ANGIOGRAM N/A 01/31/2014   Procedure: LEFT HEART CATHETERIZATION WITH Beatrix Fetters;  Surgeon: Sanda Klein, MD;  Location: [place] CATH LAB;  Service: Cardiovascular;  Laterality: N/A;  . NOSE SURGERY    . PERCUTANEOUS CORONARY STENT INTERVENTION (PCI-S)  01/31/2014   Procedure:  PERCUTANEOUS CORONARY STENT INTERVENTION (PCI-S);  Surgeon: Sanda Klein, MD;  Location: [facility] CATH LAB;  Service: Cardiovascular;;  . RADIAL ARTERY HARVEST Left 09/19/2016   Procedure: RADIAL ARTERY HARVEST;  Surgeon: [name], MD;  Location: [place];  Service: Open Heart Surgery;  Laterality: Left;  . TEE WITHOUT CARDIOVERSION N/A 09/19/2016   Procedure: TRANSESOPHAGEAL ECHOCARDIOGRAM (TEE);  Surgeon: [name], MD;  Location: [place];  Service: Open Heart Surgery;  Laterality: N/A;     No outpatient medications have been marked as taking for the 11/26/18 encounter (Appointment) with Arnoldo Lenis, MD.     Allergies:   Cardizem cd [diltiazem hcl er beads]   Social History   Tobacco Use  . Smoking status: Former Smoker    Packs/day: 1.00    Years: 40.00    Pack years: 40.00  Types: Cigarettes    Start date: 02/20/1959    Quit date: 09/13/2016    Years since quitting: 2.2  . Smokeless tobacco: Never Used  . Tobacco comment: 10/7 2-8 cigarettes per day. cutting back- AJ  Substance Use Topics  . Alcohol use: No    Alcohol/week: 0.0 standard drinks    Comment: "No, not really"  . Drug use: No     Family Hx: The patient's family history includes Heart attack in his brother and mother.  ROS:   Please see the history of present illness.     All other systems reviewed and are negative.   Prior CV studies:   The following studies were reviewed today:  01/31/14 Cath Angiographic Findings: 1. The left main coronary arteryis not stenotic, but exhibits significant atherosclerosis and bifurcates in the usual fashion into the left anterior descending artery and left circumflex coronary artery.  2. The left anterior descending arteryis a large vessel that reaches the apex and generates several proximal septals, but is occluded before any of the major diagonal branches. There is evidence of extensive luminal irregularities and mild calcification.  3. The left circumflex  coronary arteryis a medium-size vessel non dominant vessel that generates two major oblique marginal arteries. There is evidence of extensive luminal irregularities and mild calcification. No hemodynamically meaningful stenoses are seen. The first OM is flush occluded. The distal OM is medium in size.  4. The right coronary arteryis a very large-size dominant vessel that generates a posterior lateral ventricular artery that is proximally occluded as well as a diffusely diseased PDA. There is evidence of extensive luminal irregularities and mild calcification.  5. The sequential SVG to the diagonal and first OMis very irregular in caliber, but widely patent, without stenoses. The first diagonal artery is severely diseased and small in caliber. The OM Dominika Losey is large and free of major stenoses.  6. The SVG to the second diagonalis widely patent and healthy, feeding a relatively large vessel.  7. The LIMA to the LADis widely patent with both retrograde and antegrade LAD flow. There are significant stenoses both upstream and downstream of the anastomosis, but there is excellent flow.  8. The SVG to RCA-PLA has a severe, ulcerated stenosis in its distal third, at least 95% in severity, but with TIMI 3 flow. It is the culprit lesion.  5. The left ventriclewas not injected.There is no aortic valve stenosis by pullback. The left ventricular end-diastolic pressure is 24 mm Hg.   IMPRESSIONS: High grade unstable stenosis in the SVG to PLA causing NSTEMI  RECOMMENDATION: Urgent PCI, preferably with filter wire.   IMPRESSIONS: 1. Successful PCI of the SVG to right coronary artery graft with a 3.0 x 23 Alpine drug-eluting stent - dilated to 3.25 mm in diameter, using a distal embolic protection device, 4.0 spider. 2.         04/2014 Carotid US Moderate bilateral stenosis  12/2011 Abdominal US No aortic aneurysm   Jan 2013 [facility] LVEF 65%      [facility] LVEF  60-65%, aortic valve sclerosis.   cath at [facility]/18/16. LM 20%, prox LAD 100% CTO, D2 diffuse disease, LCX LIs, OM2 80%, RCA right PL segment 90%. LIMA patent, SVG-OM2 patent, SVG-D2 50% stenosis, SVG-right PL occluded. Recs were for medical management.  09/2016 cath  The left ventricular ejection fraction is 45-50% by visual estimate.  LV end diastolic pressure is normal.  There is mild left ventricular systolic dysfunction.  Dist RCA lesion, 90 %  stenosed.  SVG.  Dist Graft-1 lesion, 95 %stenosed.  Dist Graft-2 lesion, 50 %stenosed.  Prox Graft lesion, 80 %stenosed.  Ost 2nd Mrg lesion, 100 %stenosed.  SVG.  Mid Graft lesion, 100 %stenosed.  Prox Graft lesion, 100 %stenosed.  Seq SVG-.  Origin lesion, 99 %stenosed.  Prox LAD lesion, 100 %stenosed.  Prox Graft to Dist Graft lesion, 25 %stenosed.  Ost LAD to Prox LAD lesion, 80 %stenosed.  Multivessel native CAD with diffuse 80% proximal LAD stenosis and occlusion of the LAD after a small first diagonal vessel; occluded marginal Timmie Calix arising from the mid AV groove circumflex with an otherwise patent AV groove circumflex extending distally; RCA with 20% mid narrowing normal PDA, but with 90% PLA stenosis.  Patent LIMA graft supplying the mid LAD.  SVG sequentially supplying the diagonal-1 and OM vessel with 99% near ostial graft stenosis and diffuse luminal irregularity in the body of the graft prior to the initial anastomosis.  SVG supplying the diagonal 2 vessel with diffuse proximal to mid graft stenosis, 95% ulcerated plaque stenosis in the distal third of the graft and 50% eccentric stenosis prior to its anastomosis.  SVG which had previously supplied the PLA and had been stented, is occluded proximally.  Mild-to-moderate LV dysfunction with mid distal anterolateral hypocontractility and an ejection fraction of 45-50%.  RECOMMENDATION: The patient had previously undergone CABG surgery by Dr. Cyndia Bent in  2008. Dr. Cyndia Bent will be re-consulted for evaluation and consideration of possible redo CABG revascularization surgery.   09/2016 echo Study Conclusions  - Left ventricle: The cavity size was normal. Wall thickness was increased in a pattern of mild LVH. Systolic function was normal. The estimated ejection fraction was in the range of 50% to 55%. Abnormal GLPSS at -15%, inferoseptal strain abnormality. Basal to mid inferior/inferoseptal hypokinesis. Doppler parameters are consistent with abnormal left ventricular relaxation (grade 1 diastolic dysfunction). The E/e&' ratio is between 8-15, suggesting indeterminate LV filling pressure. - Aortic valve: Poorly visualized. Mild stenosis. Mildly calcified leaflets. There was mild regurgitation. Mean gradient (S): 13 mm Hg. Peak gradient (S): 28 mm Hg. - Left atrium: The atrium was normal in size. - Inferior vena cava: The vessel was normal in size. The respirophasic diameter changes were in the normal range (>= 50%), consistent with normal central venous pressure.  Impressions:  - LVEF 50-55%, mild LVH, basal to mid inferior, inferoseptal hypokinesis, abnormal GLPSS at -15%, grade 1 DD with indeterminate LV filling pressure, mild calcific aortic stenosis - mean gradient of 13 mmHg, normal LA size, normal IVC.  09/2016 Carotid US/ABI preCABG Summary:  1. Carotid Duplex Evaluation - Right internal carotid artery 1-39% stenosis, left internal carotid artery 40-59% stenosis. 2. Palmar Arch Evaluation - Right radial artery decreased greater than 50 percent with compression and right ulnar remains within normal limits with compression. . Left Doppler waveforms remain within normal limits with radial and ulnar compression. 3. ABI Evaluation - Right ABI appeared normal at rest. Left ABI is suggestive of moderate arterial disease at rest.   12/2016 echo Study Conclusions  - Left  ventricle: The cavity size was normal. Wall thickness was normal. Systolic function was vigorous. The estimated ejection fraction was in the range of 65% to 70%. Wall motion was normal; there were no regional wall motion abnormalities. - Aortic valve: Moderately calcified annulus. Trileaflet; mildly calcified leaflets. There was mild regurgitation. Mean gradient (S): 12 mm Hg. Peak gradient (S): 24 mm Hg. Valve area (VTI): 2.44 cm^2. - Mitral valve: Mildly  calcified annulus. There was trivial regurgitation. - Left atrium: The atrium was mildly dilated. - Right atrium: Central venous pressure (est): 3 mm Hg. - Atrial septum: No defect or patent foramen ovale was identified. - Tricuspid valve: There was trivial regurgitation. - Pulmonary arteries: PA peak pressure: 28 mm Hg (S). - Pericardium, extracardiac: There was no pericardial effusion.  Impressions:  - Normal LV wall thickness with LVEF 65-70% and grade 2 diastolic dysfunction. Mild left atrial enlargement. Mildly calcified mitral annulus with trivial mitral regurgitation. Moderately sclerotic aortic valve with mild aortic regurgitation. Trivial tricuspid regurgitation with PASP estimated 28 mmHg.   02/2017 nuclear stress  There was no ST segment deviation noted during stress.  Findings consistent with prior moderate inferior/inferolateral myocardial infarction with mild to moderate peri-infarct ischemia.  The left ventricular ejection fraction is normal (55-65%).  Low to intermediate risk study  07/02/17 cath [facility] Findings: 1. Severe native CAD with occlusion of the LAD at the mid-portion and 80%  proximal RCA disease 2. Patent LIMA-LAD, LRA graft-distal RCA, SVG-OM/D1.  3. Occlusion of the RIMA mid-graft, though this was semi-engaged.  4. Diffuse plaque and acute on chronic occlusion of the SVG-D2. This is  the likely culprit vessel forhe NSTEMI but SVG is do severely diseased,  that  percutaneous intervention is likely not beneficial. 5. Chronic occlusion of SVG-rPL.  Recommendations: 1. Medical Management of complex CAD and NSTEMI.  06/2017 echo [facility] Ultrasound enhancing agent utilized to improve endocardial border  definition  Left ventricular hypertrophy - mild  Normal left ventricular systolic function, ejection fraction 65 to 70%  Segmental wall motion abnormality - (inferior / posterior)  Diastolic dysfunction - grade II (elevated filling pressures)  Degenerative mitral valve disease  Dilated left atrium - moderate to severe  Aortic sclerosis  Normal right ventricular systolic function  Tricuspid regurgitation - mild  Elevated pulmonary artery systolic pressure - mild  Dilated right atrium - mild     Labs/Other Tests and Data Reviewed:    EKG:    Recent Labs: No results found for requested labs within last 8760 hours.   Recent Lipid Panel Lab Results  Component Value Date/Time   CHOL 194 09/14/2016 03:38 AM   TRIG 218 (H) 09/14/2016 03:38 AM   HDL 23 (L) 09/14/2016 03:38 AM   CHOLHDL 8.4 09/14/2016 03:38 AM   LDLCALC 127 (H) 09/14/2016 03:38 AM   LDLDIRECT 87.8 02/10/2010 08:39 AM    Wt Readings from Last 3 Encounters:  07/26/18 223 lb 12.8 oz (101.5 kg)  06/15/18 224 lb 12.8 oz (102 kg)  03/19/18 222 lb 6.4 oz (100.9 kg)     Objective:    Vital Signs:   Today's Vitals   11/26/18 0902  BP: (!) 160/60  Pulse: 70  Weight: 222 lb 14.4 oz (101.1 kg)  Height: 5\' 9"  (1.753 m)   Body mass index is 32.92 kg/m. Normal affect. Normal speech pattern and tone. Comfortable, no apparent distress. No audible signs of SOB or wheezing.   ASSESSMENT & PLAN:    1. Chronicdiastolic HF - weights are stable, no significant symptoms. Continue current diuretic   2. CAD - complex extensive history outlined above - no recent symptms, continue current meds. On plavix as opposed to ASA due to prior GI bleeds  3. HTN - reasonable  bp control at home, continue current meds  4. Anemia - prior issues with GI bleeding as reported above, no recent issues - he is off ASA, maintained on just plavix alone.  -  request labs from pcp   COVID-19 Education: The signs and symptoms of COVID-19 were discussed with the patient and how to seek care for testing (follow up with PCP or arrange E-visit).  The importance of social distancing was discussed today.  Time:   Today, I have spent 22 minutes with the patient with telehealth technology discussing the above problems.     Medication Adjustments/Labs and Tests Ordered: Current medicines are reviewed at length with the patient today.  Concerns regarding medicines are outlined above.   Tests Ordered: No orders of the defined types were placed in this encounter.   Medication Changes: No orders of the defined types were placed in this encounter.   Follow Up:  In Person in 4 month(s)  Signed, Carlyle Dolly, MD  11/26/2018 8:27 AM    [place]

## 2018-11-29 ENCOUNTER — Other Ambulatory Visit: Payer: Self-pay | Admitting: Cardiology

## 2019-01-14 DIAGNOSIS — I10 Essential (primary) hypertension: Secondary | ICD-10-CM | POA: Diagnosis not present

## 2019-01-14 DIAGNOSIS — R42 Dizziness and giddiness: Secondary | ICD-10-CM | POA: Diagnosis not present

## 2019-01-14 DIAGNOSIS — Z79899 Other long term (current) drug therapy: Secondary | ICD-10-CM | POA: Diagnosis not present

## 2019-01-14 DIAGNOSIS — R110 Nausea: Secondary | ICD-10-CM | POA: Diagnosis not present

## 2019-01-14 DIAGNOSIS — R51 Headache: Secondary | ICD-10-CM | POA: Diagnosis not present

## 2019-01-14 DIAGNOSIS — I252 Old myocardial infarction: Secondary | ICD-10-CM | POA: Diagnosis not present

## 2019-01-14 DIAGNOSIS — N189 Chronic kidney disease, unspecified: Secondary | ICD-10-CM | POA: Diagnosis not present

## 2019-01-17 ENCOUNTER — Encounter: Payer: Self-pay | Admitting: *Deleted

## 2019-01-17 ENCOUNTER — Telehealth: Payer: Self-pay | Admitting: *Deleted

## 2019-01-17 NOTE — Telephone Encounter (Signed)
Pt has stage 4 CKD and oncologist wanting Dr Jene Every on decreasing lasix and if so how much he should decrease - pt currently takes 40mg  bid of lasix

## 2019-01-17 NOTE — Telephone Encounter (Signed)
Kidneys must have worsened compared to his 06/2018 labs, would need most recent labs to decide   Zandra Abts MD

## 2019-01-17 NOTE — Telephone Encounter (Signed)
Labs requested from [place] in [place], [place] - pt says he had them done middle of August

## 2019-01-29 NOTE — Telephone Encounter (Signed)
Nothing received to present date - will request again.

## 2019-01-29 NOTE — Telephone Encounter (Signed)
Patient called in regards to recent labs that he had done at the [place]

## 2019-02-21 NOTE — Telephone Encounter (Signed)
Requested labs from [place] multiple times also [facility] and said [facility] would be sending

## 2019-03-04 ENCOUNTER — Telehealth: Payer: Self-pay | Admitting: Cardiology

## 2019-03-04 NOTE — Telephone Encounter (Signed)
Pt scheduled and will request labs

## 2019-03-04 NOTE — Telephone Encounter (Signed)
Can he see me at 120 tomorrow in [place]. Can we get any of his recent blood work and scan it in   Zandra Abts MD

## 2019-03-04 NOTE — Telephone Encounter (Signed)
Patient called stating that his BP continues to stay high with shortness of breath.

## 2019-03-04 NOTE — Telephone Encounter (Signed)
BP 181/63 this morning before taking BP meds - 194/64 yesterday before meds then took again after taking BP meds 165/56 and 152/55 HR 55-65 - c/o SOB/fatigued for the last 2 weeks with some swelling increased his lasix for a few days and the swelling resolved - says weight has remained the same - denies dizziness and chest pain

## 2019-03-05 ENCOUNTER — Ambulatory Visit (INDEPENDENT_AMBULATORY_CARE_PROVIDER_SITE_OTHER): Payer: Medicare Other | Admitting: Cardiology

## 2019-03-05 ENCOUNTER — Other Ambulatory Visit: Payer: Self-pay

## 2019-03-05 ENCOUNTER — Encounter: Payer: Self-pay | Admitting: Cardiology

## 2019-03-05 VITALS — BP 158/59 | HR 61 | Temp 97.8°F | Ht 69.0 in | Wt 229.0 lb

## 2019-03-05 DIAGNOSIS — I10 Essential (primary) hypertension: Secondary | ICD-10-CM | POA: Diagnosis not present

## 2019-03-05 DIAGNOSIS — I5032 Chronic diastolic (congestive) heart failure: Secondary | ICD-10-CM | POA: Diagnosis not present

## 2019-03-05 DIAGNOSIS — M79604 Pain in right leg: Secondary | ICD-10-CM | POA: Diagnosis not present

## 2019-03-05 DIAGNOSIS — R0602 Shortness of breath: Secondary | ICD-10-CM

## 2019-03-05 DIAGNOSIS — M79605 Pain in left leg: Secondary | ICD-10-CM

## 2019-03-05 DIAGNOSIS — I2510 Atherosclerotic heart disease of native coronary artery without angina pectoris: Secondary | ICD-10-CM

## 2019-03-05 MED ORDER — HYDRALAZINE HCL 100 MG PO TABS: 100.0000 mg | ORAL_TABLET | Freq: Three times a day (TID) | ORAL | 9 refills | Status: DC

## 2019-03-05 NOTE — Progress Notes (Signed)
Clinical Summary [name] is a 73 y.o.male seen today for follow up of the following medical problems.  1.Chronicdiastolic HF - recent weight gain from his baseline of 222 up to 228 lbs. Had increased SOB. He increased lasix to 60mg  in AM in 40mg  pm, weight improved and breathing improved but not back to normal. Still having some issues with SOB and fatigue.  DOE with activities, sometimes just walking to carport. No specific chest pain. No coughing, no wheezing.  - limiting sodium intake.   2. CAD - hx of CABG in Jan 2008 (LIMA-LAD, SVG-D2, SVG-OM, SVG-RPL) - admit [facility]/14 [name] with N[facility]I, received DES to SVG-RCA. No LV gram.  - 08/2014 echo from [place] LVEF 60-65%  - had nuclear stress 2 at [facility]. Large moderate lateral defect fixed. 11/2015.  -cath at [facility] 03/18/16. LM 20%, prox LAD 100% CTO, D2 diffuse disease, LCX LIs, OM2 80%, RCA right PL segment 90%. LIMA patent, SVG-OM2 patent, SVG-D2 50% stenosis, SVG-right PL occluded. Recs were for medical management. Fairly stable findings from prior cath however SVG-RCA PL which was previously stented is now occluded.    - admit 09/2016 with NSTEMI, peak troponin 0.43. Cath as reported below. He was referred for repeat CABG - CABG 09/19/16 with RIMA-diag, left radial to distal RCA - echo 09/2016 LVEF 50-55% - has not been on ACE-I due to poor renal function  02/2017 nuclear stress: inferior/inferolateral infarct with mild to moderate ischemia.  -Admitted 06/2017 with NSTEMI to [facility]. Peak trop 10.  06/29/17 cath: chronic occluded LAD, 80% prox RCA Patent LIMA-LAD, LRA graft to distal RCA, SVG-OM/D1. Occluded RIMA graft,diffuse plaque and acute on chronic occlusion of the SVG-D2 thought culprit poor PCI target.Chronic occlusion of SVG-rPL - echo 06/2017 [facility] LVEF 65-70%, grade II diastolicdysfunction - post cath AKI with Cr to 2. Thought due to cath dye, diuresis due to CHF.  - had been on just plavix post MI due to prior  GI bleeds. - admitted mid October with chest pain and MI. - admitted [place] x 5 days. Treated medically due to prior caths and poor targets. Had some evidence of CHF time. Discharged home.   --denies any recent chest pain.     4.CKD - [facility] and [facility] in [place], [place]. - last Cr 06/2018 was 1.6   5. HTN - home bps 140s-190s/50s-60s.     6. Anemia - followed by [facility] - [facility] admit to  [facility]. hgb was was to 4.1. Transfused 5 units of pRBCs - had EGD was normal. Discharged last Wed  - no recurrent black stools.  - had repeat labswith pcp this week  - reports EGD, colonscopy, and pill endoscopy. He reports some evidence of possible source, was cauterized.  - no recurrence. Reports Hgb 9.8 2 weeks ago. Off ASA, remains on plavix.  Jan 2020 Hgb down to 7.1, dark stools. Sent to er ER - plavix was stopped. Started on ASA - had EGD Jan 24,2020 with actively bleeding duodenal ulcer, it was cauterized - did receive2unit of pRBCs. Discharge Hgb 8.7     - last Hgb 8.6 in September per his report - no signs of bleeding. Has had some SOB and fatigue, unclear if related to anemia   7. Leg pains - bilateral calf pain with walking at 1/2 block - no sores on feet. - only occurs with walking.   Past Medical History:  Diagnosis Date  . Acute  myocardial infarction of other inferior wall, initial episode of care   . Anginal pain ([place])   . Anxiety   . Arthritis   . Cancer [facility])    bladder 2013 removed, has cystoscopy 10/2016, has returned x 2  . COPD (chronic obstructive pulmonary disease) ([place])   . Coronary artery disease    Artery bypass graft Jan 2008  . Dyspnea   . Dysrhythmia   . GERD (gastroesophageal reflux disease)   . Heart murmur   . History of hiatal hernia   . Hyperlipidemia   . Hypertension   . Hypothyroidism   . PONV (postoperative nausea and vomiting)   . Sleep apnea   . Stroke ([place])   .  Tobacco user      Allergies  Allergen Reactions  . Cardizem Cd [Diltiazem Hcl Er Beads] Palpitations    "Makes heart skip"     Current Outpatient Medications  Medication Sig Dispense Refill  . acetaminophen (TYLENOL) 325 MG tablet Take 2 tablets (650 mg total) by mouth every 6 (six) hours as needed for mild pain.    Marland Kitchen amLODipine (NORVASC) 10 MG tablet Take 10 mg by mouth daily.    Marland Kitchen atorvastatin (LIPITOR) 80 MG tablet Take 80 mg by mouth daily.    . clopidogrel (PLAVIX) 75 MG tablet Take 75 mg by mouth daily.    . cyanocobalamin (,VITAMIN B-12,) 1000 MCG/ML injection Inject into the muscle.    . finasteride (PROSCAR) 5 MG tablet Take 5 mg by mouth daily.    . folic acid (FOLVITE) 1 MG tablet Take by mouth.    . furosemide (LASIX) 40 MG tablet Take 1 tablet (40 mg total) by mouth 2 (two) times daily. May take an additional 20 mg (1/2 tablet) in the mornings for weight above 217 lbs as needed. 135 tablet 1  . hydrALAZINE (APRESOLINE) 50 MG tablet TAKE 1.5 TABLETS (75 MG TOTAL) BY MOUTH 3 (THREE) TIMES DAILY. 405 tablet 1  . isosorbide mononitrate (IMDUR) 120 MG 24 hr tablet Take 120 mg by mouth daily.    Marland Kitchen levothyroxine (SYNTHROID, LEVOTHROID) 50 MCG tablet Take 50 mcg by mouth daily before breakfast.    . metoprolol succinate (TOPROL-XL) 25 MG 24 hr tablet Take 25 mg by mouth daily.    . nitroGLYCERIN (NITROSTAT) 0.4 MG SL tablet Place 0.4 mg under the tongue every 5 (five) minutes as needed for chest pain.    . Omega-3 Fatty Acids (RA FISH OIL) 1000 MG CAPS Take 2,000 mg by mouth 2 (two) times daily.     Marland Kitchen omeprazole (PRILOSEC) 20 MG capsule Take 40 mg by mouth 2 (two) times daily.     . tamsulosin (FLOMAX) 0.4 MG CAPS capsule Take 0.4 mg by mouth daily.    . Vitamin D, Cholecalciferol, 25 MCG (1000 UT) CAPS Take by mouth.     No current facility-administered medications for this visit.      Past Surgical History:  Procedure Laterality Date  . APPENDECTOMY    . CARDIAC  CATHETERIZATION    . CORONARY ARTERY BYPASS GRAFT  06/14/2006   x 5  . CORONARY ARTERY BYPASS GRAFT N/A 09/19/2016   Procedure: REDO CORONARY ARTERY BYPASS GRAFTING (CABG) x two, using right internal mammary artery and left radial artery;  Surgeon: [name], MD;  Location: [place];  Service: Open Heart Surgery;  Laterality: N/A;  . FOOT SURGERY    . HERNIA REPAIR    . LEFT HEART CATH AND CORS/GRAFTS ANGIOGRAPHY  N/A 09/14/2016   Procedure: Left Heart Cath and Cors/Grafts Angiography;  Surgeon: Troy Sine, MD;  Location: [employer] CV LAB;  Service: Cardiovascular;  Laterality: N/A;  . LEFT HEART CATHETERIZATION WITH CORONARY/GRAFT ANGIOGRAM N/A 01/31/2014   Procedure: LEFT HEART CATHETERIZATION WITH Beatrix Fetters;  Surgeon: Sanda Klein, MD;  Location: [place] CATH LAB;  Service: Cardiovascular;  Laterality: N/A;  . NOSE SURGERY    . PERCUTANEOUS CORONARY STENT INTERVENTION (PCI-S)  01/31/2014   Procedure: PERCUTANEOUS CORONARY STENT INTERVENTION (PCI-S);  Surgeon: Sanda Klein, MD;  Location: [facility] CATH LAB;  Service: Cardiovascular;;  . RADIAL ARTERY HARVEST Left 09/19/2016   Procedure: RADIAL ARTERY HARVEST;  Surgeon: [name], MD;  Location: [place];  Service: Open Heart Surgery;  Laterality: Left;  . TEE WITHOUT CARDIOVERSION N/A 09/19/2016   Procedure: TRANSESOPHAGEAL ECHOCARDIOGRAM (TEE);  Surgeon: [name], MD;  Location: [place];  Service: Open Heart Surgery;  Laterality: N/A;     Allergies  Allergen Reactions  . Cardizem Cd [Diltiazem Hcl Er Beads] Palpitations    "Makes heart skip"      Family History  Problem Relation Age of Onset  . Heart attack Mother   . Heart attack Brother      Social History Mr. Tickner reports that he quit smoking about 2 years ago. His smoking use included cigarettes. He started smoking about 60 years ago. He has a 40.00 pack-year smoking history. He has never used smokeless tobacco. Mr. Bera reports no history of alcohol use.    Review of Systems CONSTITUTIONAL: No weight loss, fever, chills, weakness or fatigue.  HEENT: Eyes: No visual loss, blurred vision, double vision or yellow sclerae.No hearing loss, sneezing, congestion, runny nose or sore throat.  SKIN: No rash or itching.  CARDIOVASCULAR: per hpi RESPIRATORY: per hpi.  GASTROINTESTINAL: No anorexia, nausea, vomiting or diarrhea. No abdominal pain or blood.  GENITOURINARY: No burning on urination, no polyuria NEUROLOGICAL: No headache, dizziness, syncope, paralysis, ataxia, numbness or tingling in the extremities. No change in bowel or bladder control.  MUSCULOSKELETAL: No muscle, back pain, joint pain or stiffness.  LYMPHATICS: No enlarged nodes. No history of splenectomy.  PSYCHIATRIC: No history of depression or anxiety.  ENDOCRINOLOGIC: No reports of sweating, cold or heat intolerance. No polyuria or polydipsia.  Marland Kitchen   Physical Examination Today's Vitals   03/05/19 1308  BP: (!) 158/59  Pulse: 61  Temp: 97.8 F (36.6 C)  SpO2: 97%  Weight: 229 lb (103.9 kg)  Height: 5\' 9"  (1.753 m)   Body mass index is 33.82 kg/m.  Gen: resting comfortably, no acute distress HEENT: no scleral icterus, pupils equal round and reactive, no palptable cervical adenopathy,  CV: RRR, 2/6 systolic murmur rusb, bilateral carotid bruits Resp: Clear to auscultation bilaterally GI: abdomen is soft, non-tender, non-distended, normal bowel sounds, no hepatosplenomegaly MSK: extremities are warm, trace bilateral edema Skin: warm, no rash Neuro:  no focal deficits Psych: appropriate affect   Diagnostic Studies 01/31/14 Cath Angiographic Findings: 1. The left main coronary arteryis not stenotic, but exhibits significant atherosclerosis and bifurcates in the usual fashion into the left anterior descending artery and left circumflex coronary artery.  2. The left anterior descending arteryis a large vessel that reaches the apex and generates several proximal  septals, but is occluded before any of the major diagonal branches. There is evidence of extensive luminal irregularities and mild calcification.  3. The left circumflex coronary arteryis a medium-size vessel non dominant vessel that generates two major oblique  marginal arteries. There is evidence of extensive luminal irregularities and mild calcification. No hemodynamically meaningful stenoses are seen. The first OM is flush occluded. The distal OM is medium in size.  4. The right coronary arteryis a very large-size dominant vessel that generates a posterior lateral ventricular artery that is proximally occluded as well as a diffusely diseased PDA. There is evidence of extensive luminal irregularities and mild calcification.  5. The sequential SVG to the diagonal and first OMis very irregular in caliber, but widely patent, without stenoses. The first diagonal artery is severely diseased and small in caliber. The OM Cace Osorto is large and free of major stenoses.  6. The SVG to the second diagonalis widely patent and healthy, feeding a relatively large vessel.  7. The LIMA to the LADis widely patent with both retrograde and antegrade LAD flow. There are significant stenoses both upstream and downstream of the anastomosis, but there is excellent flow.  8. The SVG to RCA-PLA has a severe, ulcerated stenosis in its distal third, at least 95% in severity, but with TIMI 3 flow. It is the culprit lesion.  5. The left ventriclewas not injected.There is no aortic valve stenosis by pullback. The left ventricular end-diastolic pressure is 24 mm Hg.   IMPRESSIONS: High grade unstable stenosis in the SVG to PLA causing NSTEMI  RECOMMENDATION: Urgent PCI, preferably with filter wire.   IMPRESSIONS: 1. Successful PCI of the SVG to right coronary artery graft with a 3.0 x 23 Alpine drug-eluting stent - dilated to 3.25 mm in diameter, using a distal embolic protection device, 4.0 spider. 2.          04/2014 Carotid US Moderate bilateral stenosis  12/2011 Abdominal US No aortic aneurysm   Jan 2013 [facility] LVEF 65%      08/[facility]F 60-65%, aortic valve sclerosis.   cath at [facility]/18/16. LM 20%, prox LAD 100% CTO, D2 diffuse disease, LCX LIs, OM2 80%, RCA right PL segment 90%. LIMA patent, SVG-OM2 patent, SVG-D2 50% stenosis, SVG-right PL occluded. Recs were for medical management.  09/2016 cath  The left ventricular ejection fraction is 45-50% by visual estimate.  LV end diastolic pressure is normal.  There is mild left ventricular systolic dysfunction.  Dist RCA lesion, 90 %stenosed.  SVG.  Dist Graft-1 lesion, 95 %stenosed.  Dist Graft-2 lesion, 50 %stenosed.  Prox Graft lesion, 80 %stenosed.  Ost 2nd Mrg lesion, 100 %stenosed.  SVG.  Mid Graft lesion, 100 %stenosed.  Prox Graft lesion, 100 %stenosed.  Seq SVG-.  Origin lesion, 99 %stenosed.  Prox LAD lesion, 100 %stenosed.  Prox Graft to Dist Graft lesion, 25 %stenosed.  Ost LAD to Prox LAD lesion, 80 %stenosed.  Multivessel native CAD with diffuse 80% proximal LAD stenosis and occlusion of the LAD after a small first diagonal vessel; occluded marginal Resean Brander arising from the mid AV groove circumflex with an otherwise patent AV groove circumflex extending distally; RCA with 20% mid narrowing normal PDA, but with 90% PLA stenosis.  Patent LIMA graft supplying the mid LAD.  SVG sequentially supplying the diagonal-1 and OM vessel with 99% near ostial graft stenosis and diffuse luminal irregularity in the body of the graft prior to the initial anastomosis.  SVG supplying the diagonal 2 vessel with diffuse proximal to mid graft stenosis, 95% ulcerated plaque stenosis in the distal third of the graft and 50% eccentric stenosis prior to its anastomosis.  SVG which had previously supplied the PLA and had been stented, is occluded proximally.  Mild-to-moderate LV dysfunction  with mid distal anterolateral hypocontractility and an ejection fraction of 45-50%.  RECOMMENDATION: The patient had previously undergone CABG surgery by Dr. Cyndia Bent in 2008. Dr. Cyndia Bent will be re-consulted for evaluation and consideration of possible redo CABG revascularization surgery.   09/2016 echo Study Conclusions  - Left ventricle: The cavity size was normal. Wall thickness was increased in a pattern of mild LVH. Systolic function was normal. The estimated ejection fraction was in the range of 50% to 55%. Abnormal GLPSS at -15%, inferoseptal strain abnormality. Basal to mid inferior/inferoseptal hypokinesis. Doppler parameters are consistent with abnormal left ventricular relaxation (grade 1 diastolic dysfunction). The E/e&' ratio is between 8-15, suggesting indeterminate LV filling pressure. - Aortic valve: Poorly visualized. Mild stenosis. Mildly calcified leaflets. There was mild regurgitation. Mean gradient (S): 13 mm Hg. Peak gradient (S): 28 mm Hg. - Left atrium: The atrium was normal in size. - Inferior vena cava: The vessel was normal in size. The respirophasic diameter changes were in the normal range (>= 50%), consistent with normal central venous pressure.  Impressions:  - LVEF 50-55%, mild LVH, basal to mid inferior, inferoseptal hypokinesis, abnormal GLPSS at -15%, grade 1 DD with indeterminate LV filling pressure, mild calcific aortic stenosis - mean gradient of 13 mmHg, normal LA size, normal IVC.  09/2016 Carotid US/ABI preCABG Summary:  1. Carotid Duplex Evaluation - Right internal carotid artery 1-39% stenosis, left internal carotid artery 40-59% stenosis. 2. Palmar Arch Evaluation - Right radial artery decreased greater than 50 percent with compression and right ulnar remains within normal limits with compression. . Left Doppler waveforms remain within normal limits with radial and ulnar compression. 3.  ABI Evaluation - Right ABI appeared normal at rest. Left ABI is suggestive of moderate arterial disease at rest.   12/2016 echo Study Conclusions  - Left ventricle: The cavity size was normal. Wall thickness was normal. Systolic function was vigorous. The estimated ejection fraction was in the range of 65% to 70%. Wall motion was normal; there were no regional wall motion abnormalities. - Aortic valve: Moderately calcified annulus. Trileaflet; mildly calcified leaflets. There was mild regurgitation. Mean gradient (S): 12 mm Hg. Peak gradient (S): 24 mm Hg. Valve area (VTI): 2.44 cm^2. - Mitral valve: Mildly calcified annulus. There was trivial regurgitation. - Left atrium: The atrium was mildly dilated. - Right atrium: Central venous pressure (est): 3 mm Hg. - Atrial septum: No defect or patent foramen ovale was identified. - Tricuspid valve: There was trivial regurgitation. - Pulmonary arteries: PA peak pressure: 28 mm Hg (S). - Pericardium, extracardiac: There was no pericardial effusion.  Impressions:  - Normal LV wall thickness with LVEF 65-70% and grade 2 diastolic dysfunction. Mild left atrial enlargement. Mildly calcified mitral annulus with trivial mitral regurgitation. Moderately sclerotic aortic valve with mild aortic regurgitation. Trivial tricuspid regurgitation with PASP estimated 28 mmHg.   02/2017 nuclear stress  There was no ST segment deviation noted during stress.  Findings consistent with prior moderate inferior/inferolateral myocardial infarction with mild to moderate peri-infarct ischemia.  The left ventricular ejection fraction is normal (55-65%).  Low to intermediate risk study  07/02/17 cath [facility] Findings: 1. Severe native CAD with occlusion of the LAD at the mid-portion and 80%  proximal RCA disease 2. Patent LIMA-LAD, LRA graft-distal RCA, SVG-OM/D1.  3. Occlusion of the RIMA mid-graft, though this was  semi-engaged.  4. Diffuse plaque and acute on chronic occlusion of the SVG-D2. This is  the likely culprit vessel forhe NSTEMI but  SVG is do severely diseased,  that percutaneous intervention is likely not beneficial. 5. Chronic occlusion of SVG-rPL.  Recommendations: 1. Medical Management of complex CAD and NSTEMI.  06/2017 echo [facility] Ultrasound enhancing agent utilized to improve endocardial border  definition  Left ventricular hypertrophy - mild  Normal left ventricular systolic function, ejection fraction 65 to 70%  Segmental wall motion abnormality - (inferior / posterior)  Diastolic dysfunction - grade II (elevated filling pressures)  Degenerative mitral valve disease  Dilated left atrium - moderate to severe  Aortic sclerosis  Normal right ventricular systolic function  Tricuspid regurgitation - mild  Elevated pulmonary artery systolic pressure - mild  Dilated right atrium - mild     Assessment and Plan   1. Chronicdiastolic HF - home weights around 222 lbs and stable despite high numbers by our scale - mild edema on exam. Fine balance between his CHF and CKD, continue current diuretics - ongoing SOB despite being at his baseline weight. I would start with rechecking a cbc since he has had issues with anemia, if stable would repeat echo since it has been 2 years since last study. - if stable Hgb, stable echo findings and ongoign symptoms despite better bp control may dry to diurese him a little futher as he does have some edema on exam today.   2. CAD - complex extensive history outlined above -no recent chest pains. With his SOB lower suspicion of recurrent CAD as opposed to alternative etiology, he has known fairly difficult anatomy as well and thus would not consider repeat ischemic testing unless clinically more evidence of ischemia.  - he is on plavix as opposed to ASA for secondary prevention due to prior GI bleeding - EKG today shows SR, no ischemic  changes  3. HTN -above goal, increase hydralazine to 100mg  tid.   4. Anemia - repeat cbc, will try to coordiate with pcp at the [place]. Unclear if maybe having some symptomatic anemia as the cause of his SOb/DOE   5. Leg pains - suggestive of claudication, he has extensive CAD history and very likely PAD - recommend ABIs, will try to coordinate with his pcp at the [place]  F/u 6 weeks  Arnoldo Lenis, M.D.

## 2019-03-05 NOTE — Patient Instructions (Signed)
Medication Instructions: INCREASE Hydralazine to 100 mg three times a day  Labwork: Dr.Branch will coordinate lab work with your doctor at the [place]  Procedures/Testing: None today  Follow-Up: 6 weeks with Dr.Branch  Any Additional Special Instructions Will Be Listed Below (If Applicable).     If you need a refill on your cardiac medications before your next appointment, please call your pharmacy.

## 2019-03-08 ENCOUNTER — Other Ambulatory Visit: Payer: Self-pay

## 2019-03-08 MED ORDER — HYDRALAZINE HCL 100 MG PO TABS: 100.0000 mg | ORAL_TABLET | Freq: Three times a day (TID) | ORAL | 9 refills | Status: DC

## 2019-03-14 ENCOUNTER — Telehealth: Payer: Self-pay | Admitting: *Deleted

## 2019-03-14 DIAGNOSIS — R0602 Shortness of breath: Secondary | ICD-10-CM

## 2019-03-14 NOTE — Telephone Encounter (Signed)
Pt agreeable to echo - will place orders and forward to schedulers

## 2019-03-14 NOTE — Telephone Encounter (Signed)
-----   Message from Arnoldo Lenis, MD sent at 03/13/2019  4:34 PM EDT ----- [name] looks ok, stabel anemia. Can we get an echo for SOB for him   J BrancH MD

## 2019-03-14 NOTE — Addendum Note (Signed)
Addended by: Julian Hy T on: 03/14/2019 04:30 PM   Modules accepted: Orders

## 2019-03-21 ENCOUNTER — Ambulatory Visit: Payer: Non-veteran care | Admitting: Cardiology

## 2019-03-28 ENCOUNTER — Other Ambulatory Visit (HOSPITAL_COMMUNITY): Payer: Non-veteran care

## 2019-03-28 DIAGNOSIS — R809 Proteinuria, unspecified: Secondary | ICD-10-CM | POA: Insufficient documentation

## 2019-04-04 ENCOUNTER — Other Ambulatory Visit: Payer: Self-pay | Admitting: Nephrology

## 2019-04-04 ENCOUNTER — Other Ambulatory Visit (HOSPITAL_COMMUNITY): Payer: Self-pay | Admitting: Nephrology

## 2019-04-04 DIAGNOSIS — N184 Chronic kidney disease, stage 4 (severe): Secondary | ICD-10-CM

## 2019-04-09 ENCOUNTER — Other Ambulatory Visit: Payer: Self-pay

## 2019-04-09 ENCOUNTER — Ambulatory Visit (HOSPITAL_COMMUNITY)
Admission: RE | Admit: 2019-04-09 | Discharge: 2019-04-09 | Disposition: A | Discharge status: Home / Self Care | Payer: No Typology Code available for payment source | Source: Ambulatory Visit | Attending: Nephrology | Admitting: Nephrology

## 2019-04-09 DIAGNOSIS — N184 Chronic kidney disease, stage 4 (severe): Secondary | ICD-10-CM | POA: Insufficient documentation

## 2019-04-09 DIAGNOSIS — N189 Chronic kidney disease, unspecified: Secondary | ICD-10-CM | POA: Diagnosis not present

## 2019-04-19 ENCOUNTER — Ambulatory Visit: Payer: Non-veteran care | Admitting: Cardiology

## 2019-05-06 ENCOUNTER — Telehealth: Payer: Self-pay | Admitting: Cardiology

## 2019-05-06 NOTE — Telephone Encounter (Signed)
Patient called stating that he needs to have a biopsy of kidney. States that [facility] told patient that he will need to come off his Plavix and to verify with Dr. Harl Bowie.

## 2019-05-06 NOTE — Telephone Encounter (Signed)
Spoke with [facility] in [place] Dr Theador Hawthorne and they will fax a clearance form - pt aware

## 2019-05-06 NOTE — Telephone Encounter (Signed)
LM for pt to return call - also spoke with [facility] in [place] and was told he wasn't a pt there

## 2019-05-16 ENCOUNTER — Emergency Department (HOSPITAL_COMMUNITY): Payer: No Typology Code available for payment source

## 2019-05-16 ENCOUNTER — Inpatient Hospital Stay (HOSPITAL_COMMUNITY)
Admission: EM | Admit: 2019-05-16 | Discharge: 2019-05-18 | DRG: 682 | Disposition: A | Discharge status: Home / Self Care | Payer: No Typology Code available for payment source | Attending: Family Medicine | Admitting: Family Medicine

## 2019-05-16 ENCOUNTER — Encounter (HOSPITAL_COMMUNITY): Payer: Self-pay | Admitting: Emergency Medicine

## 2019-05-16 ENCOUNTER — Other Ambulatory Visit: Payer: Self-pay

## 2019-05-16 ENCOUNTER — Observation Stay (HOSPITAL_COMMUNITY): Payer: No Typology Code available for payment source

## 2019-05-16 DIAGNOSIS — I130 Hypertensive heart and chronic kidney disease with heart failure and stage 1 through stage 4 chronic kidney disease, or unspecified chronic kidney disease: Secondary | ICD-10-CM | POA: Diagnosis present

## 2019-05-16 DIAGNOSIS — K219 Gastro-esophageal reflux disease without esophagitis: Secondary | ICD-10-CM | POA: Diagnosis present

## 2019-05-16 DIAGNOSIS — Z951 Presence of aortocoronary bypass graft: Secondary | ICD-10-CM

## 2019-05-16 DIAGNOSIS — Z87891 Personal history of nicotine dependence: Secondary | ICD-10-CM

## 2019-05-16 DIAGNOSIS — N179 Acute kidney failure, unspecified: Secondary | ICD-10-CM | POA: Diagnosis not present

## 2019-05-16 DIAGNOSIS — N2581 Secondary hyperparathyroidism of renal origin: Secondary | ICD-10-CM

## 2019-05-16 DIAGNOSIS — E782 Mixed hyperlipidemia: Secondary | ICD-10-CM | POA: Diagnosis present

## 2019-05-16 DIAGNOSIS — I10 Essential (primary) hypertension: Secondary | ICD-10-CM | POA: Diagnosis present

## 2019-05-16 DIAGNOSIS — I252 Old myocardial infarction: Secondary | ICD-10-CM

## 2019-05-16 DIAGNOSIS — I5033 Acute on chronic diastolic (congestive) heart failure: Secondary | ICD-10-CM | POA: Diagnosis present

## 2019-05-16 DIAGNOSIS — R531 Weakness: Secondary | ICD-10-CM | POA: Diagnosis present

## 2019-05-16 DIAGNOSIS — I5030 Unspecified diastolic (congestive) heart failure: Secondary | ICD-10-CM | POA: Diagnosis present

## 2019-05-16 DIAGNOSIS — R0609 Other forms of dyspnea: Secondary | ICD-10-CM

## 2019-05-16 DIAGNOSIS — R809 Proteinuria, unspecified: Secondary | ICD-10-CM

## 2019-05-16 DIAGNOSIS — Z79899 Other long term (current) drug therapy: Secondary | ICD-10-CM

## 2019-05-16 DIAGNOSIS — E669 Obesity, unspecified: Secondary | ICD-10-CM | POA: Diagnosis present

## 2019-05-16 DIAGNOSIS — J441 Chronic obstructive pulmonary disease with (acute) exacerbation: Secondary | ICD-10-CM | POA: Diagnosis not present

## 2019-05-16 DIAGNOSIS — N289 Disorder of kidney and ureter, unspecified: Secondary | ICD-10-CM | POA: Diagnosis present

## 2019-05-16 DIAGNOSIS — Z955 Presence of coronary angioplasty implant and graft: Secondary | ICD-10-CM

## 2019-05-16 DIAGNOSIS — F419 Anxiety disorder, unspecified: Secondary | ICD-10-CM | POA: Diagnosis present

## 2019-05-16 DIAGNOSIS — Z8249 Family history of ischemic heart disease and other diseases of the circulatory system: Secondary | ICD-10-CM

## 2019-05-16 DIAGNOSIS — N183 Chronic kidney disease, stage 3 unspecified: Secondary | ICD-10-CM | POA: Diagnosis present

## 2019-05-16 DIAGNOSIS — E785 Hyperlipidemia, unspecified: Secondary | ICD-10-CM | POA: Diagnosis present

## 2019-05-16 DIAGNOSIS — Z8673 Personal history of transient ischemic attack (TIA), and cerebral infarction without residual deficits: Secondary | ICD-10-CM

## 2019-05-16 DIAGNOSIS — N400 Enlarged prostate without lower urinary tract symptoms: Secondary | ICD-10-CM | POA: Diagnosis present

## 2019-05-16 DIAGNOSIS — Z20822 Contact with and (suspected) exposure to covid-19: Secondary | ICD-10-CM | POA: Diagnosis present

## 2019-05-16 DIAGNOSIS — N184 Chronic kidney disease, stage 4 (severe): Secondary | ICD-10-CM

## 2019-05-16 DIAGNOSIS — G4733 Obstructive sleep apnea (adult) (pediatric): Secondary | ICD-10-CM | POA: Diagnosis present

## 2019-05-16 DIAGNOSIS — R29898 Other symptoms and signs involving the musculoskeletal system: Secondary | ICD-10-CM

## 2019-05-16 DIAGNOSIS — I2510 Atherosclerotic heart disease of native coronary artery without angina pectoris: Secondary | ICD-10-CM | POA: Diagnosis present

## 2019-05-16 DIAGNOSIS — M6281 Muscle weakness (generalized): Secondary | ICD-10-CM | POA: Diagnosis not present

## 2019-05-16 DIAGNOSIS — E559 Vitamin D deficiency, unspecified: Secondary | ICD-10-CM

## 2019-05-16 DIAGNOSIS — E039 Hypothyroidism, unspecified: Secondary | ICD-10-CM | POA: Diagnosis present

## 2019-05-16 DIAGNOSIS — Z6833 Body mass index (BMI) 33.0-33.9, adult: Secondary | ICD-10-CM

## 2019-05-16 DIAGNOSIS — Z79890 Hormone replacement therapy (postmenopausal): Secondary | ICD-10-CM

## 2019-05-16 DIAGNOSIS — M1990 Unspecified osteoarthritis, unspecified site: Secondary | ICD-10-CM | POA: Diagnosis present

## 2019-05-16 DIAGNOSIS — D631 Anemia in chronic kidney disease: Secondary | ICD-10-CM

## 2019-05-16 DIAGNOSIS — I350 Nonrheumatic aortic (valve) stenosis: Secondary | ICD-10-CM | POA: Diagnosis present

## 2019-05-16 DIAGNOSIS — Z8551 Personal history of malignant neoplasm of bladder: Secondary | ICD-10-CM

## 2019-05-16 LAB — CBC WITH DIFFERENTIAL/PLATELET
Abs Immature Granulocytes: 0.02 10*3/uL (ref 0.00–0.07)
Basophils Absolute: 0 10*3/uL (ref 0.0–0.1)
Basophils Relative: 0 %
Eosinophils Absolute: 0.1 10*3/uL (ref 0.0–0.5)
Eosinophils Relative: 1 %
HCT: 27.3 % — ABNORMAL LOW (ref 39.0–52.0)
Hemoglobin: 8.9 g/dL — ABNORMAL LOW (ref 13.0–17.0)
Immature Granulocytes: 0 %
Lymphocytes Relative: 20 %
Lymphs Abs: 1.4 10*3/uL (ref 0.7–4.0)
MCH: 30.3 pg (ref 26.0–34.0)
MCHC: 32.6 g/dL (ref 30.0–36.0)
MCV: 92.9 fL (ref 80.0–100.0)
Monocytes Absolute: 0.6 10*3/uL (ref 0.1–1.0)
Monocytes Relative: 8 %
Neutro Abs: 5 10*3/uL (ref 1.7–7.7)
Neutrophils Relative %: 71 %
Platelets: 178 10*3/uL (ref 150–400)
RBC: 2.94 MIL/uL — ABNORMAL LOW (ref 4.22–5.81)
RDW: 13.4 % (ref 11.5–15.5)
WBC: 7.1 10*3/uL (ref 4.0–10.5)
nRBC: 0 % (ref 0.0–0.2)

## 2019-05-16 LAB — BASIC METABOLIC PANEL
Anion gap: 14 (ref 5–15)
BUN: 54 mg/dL — ABNORMAL HIGH (ref 8–23)
CO2: 25 mmol/L (ref 22–32)
Calcium: 8.6 mg/dL — ABNORMAL LOW (ref 8.9–10.3)
Chloride: 98 mmol/L (ref 98–111)
Creatinine, Ser: 2.66 mg/dL — ABNORMAL HIGH (ref 0.61–1.24)
GFR calc Af Amer: 27 mL/min — ABNORMAL LOW (ref >60)
GFR calc non Af Amer: 23 mL/min — ABNORMAL LOW (ref >60)
Glucose, Bld: 104 mg/dL — ABNORMAL HIGH (ref 70–99)
Potassium: 3.5 mmol/L (ref 3.5–5.1)
Sodium: 137 mmol/L (ref 135–145)

## 2019-05-16 LAB — D-DIMER, QUANTITATIVE: D-Dimer, Quant: 3.18 ug/mL-FEU — ABNORMAL HIGH (ref 0.00–0.50)

## 2019-05-16 LAB — POC SARS CORONAVIRUS 2 AG -  ED: SARS Coronavirus 2 Ag: NEGATIVE

## 2019-05-16 LAB — RESPIRATORY PANEL BY RT PCR (FLU A&B, COVID)
Influenza A by PCR: NEGATIVE
Influenza B by PCR: NEGATIVE
SARS Coronavirus 2 by RT PCR: NEGATIVE

## 2019-05-16 LAB — BRAIN NATRIURETIC PEPTIDE: B Natriuretic Peptide: 666 pg/mL — ABNORMAL HIGH (ref 0.0–100.0)

## 2019-05-16 LAB — TROPONIN I (HIGH SENSITIVITY)
Troponin I (High Sensitivity): 34 ng/L — ABNORMAL HIGH (ref <18)
Troponin I (High Sensitivity): 29 ng/L — ABNORMAL HIGH (ref <18)

## 2019-05-16 MED ORDER — ATORVASTATIN CALCIUM 40 MG PO TABS
80.0000 mg | ORAL_TABLET | Freq: Every day | ORAL | Status: DC
  Administered 2019-05-17 – 2019-05-18 (×2): 80 mg via ORAL
  Filled 2019-05-16 (×2): qty 2

## 2019-05-16 MED ORDER — ISOSORBIDE MONONITRATE ER 60 MG PO TB24
120.0000 mg | ORAL_TABLET | Freq: Every day | ORAL | Status: DC
  Administered 2019-05-17 – 2019-05-18 (×2): 120 mg via ORAL
  Filled 2019-05-16 (×3): qty 2

## 2019-05-16 MED ORDER — HEPARIN SODIUM (PORCINE) 5000 UNIT/ML IJ SOLN
5000.0000 [IU] | Freq: Three times a day (TID) | INTRAMUSCULAR | Status: DC
  Administered 2019-05-16 – 2019-05-18 (×4): 5000 [IU] via SUBCUTANEOUS
  Filled 2019-05-16 (×4): qty 1

## 2019-05-16 MED ORDER — POLYETHYLENE GLYCOL 3350 17 G PO PACK: 17.0000 g | PACK | Freq: Every day | ORAL | Status: DC | PRN

## 2019-05-16 MED ORDER — ALLOPURINOL 100 MG PO TABS
100.0000 mg | ORAL_TABLET | Freq: Two times a day (BID) | ORAL | Status: DC
  Administered 2019-05-17 – 2019-05-18 (×3): 100 mg via ORAL
  Filled 2019-05-16 (×4): qty 1

## 2019-05-16 MED ORDER — ACETAMINOPHEN 650 MG RE SUPP: 650.0000 mg | Freq: Four times a day (QID) | RECTAL | Status: DC | PRN

## 2019-05-16 MED ORDER — FERROUS SULFATE 325 (65 FE) MG PO TABS
325.0000 mg | ORAL_TABLET | ORAL | Status: DC
  Administered 2019-05-17: 325 mg via ORAL
  Filled 2019-05-16: qty 1

## 2019-05-16 MED ORDER — HYDRALAZINE HCL 25 MG PO TABS
100.0000 mg | ORAL_TABLET | Freq: Three times a day (TID) | ORAL | Status: DC
  Administered 2019-05-17 – 2019-05-18 (×4): 100 mg via ORAL
  Filled 2019-05-16: qty 2
  Filled 2019-05-16 (×4): qty 4

## 2019-05-16 MED ORDER — PANTOPRAZOLE SODIUM 40 MG PO TBEC
40.0000 mg | DELAYED_RELEASE_TABLET | Freq: Every day | ORAL | Status: DC
  Administered 2019-05-17 – 2019-05-18 (×2): 40 mg via ORAL
  Filled 2019-05-16 (×2): qty 1

## 2019-05-16 MED ORDER — METOPROLOL SUCCINATE ER 25 MG PO TB24
12.5000 mg | ORAL_TABLET | Freq: Two times a day (BID) | ORAL | Status: DC
  Administered 2019-05-16 – 2019-05-18 (×4): 12.5 mg via ORAL
  Filled 2019-05-16 (×4): qty 1

## 2019-05-16 MED ORDER — TRAZODONE HCL 50 MG PO TABS: 50.0000 mg | ORAL_TABLET | Freq: Every evening | ORAL | Status: DC | PRN

## 2019-05-16 MED ORDER — AMLODIPINE BESYLATE 5 MG PO TABS
10.0000 mg | ORAL_TABLET | Freq: Every day | ORAL | Status: DC
  Administered 2019-05-17 – 2019-05-18 (×2): 10 mg via ORAL
  Filled 2019-05-16 (×2): qty 2

## 2019-05-16 MED ORDER — FINASTERIDE 5 MG PO TABS
5.0000 mg | ORAL_TABLET | Freq: Every day | ORAL | Status: DC
  Administered 2019-05-17 – 2019-05-18 (×2): 5 mg via ORAL
  Filled 2019-05-16 (×3): qty 1

## 2019-05-16 MED ORDER — TECHNETIUM TO 99M ALBUMIN AGGREGATED
1.5000 | Freq: Once | INTRAVENOUS | Status: AC
  Administered 2019-05-16: 1.49 via INTRAVENOUS

## 2019-05-16 MED ORDER — ACETAMINOPHEN 325 MG PO TABS: 650.0000 mg | ORAL_TABLET | Freq: Four times a day (QID) | ORAL | Status: DC | PRN

## 2019-05-16 MED ORDER — SODIUM CHLORIDE 0.9% FLUSH
3.0000 mL | Freq: Two times a day (BID) | INTRAVENOUS | Status: DC
  Administered 2019-05-16 – 2019-05-18 (×4): 3 mL via INTRAVENOUS

## 2019-05-16 MED ORDER — LEVOTHYROXINE SODIUM 50 MCG PO TABS
50.0000 ug | ORAL_TABLET | Freq: Every day | ORAL | Status: DC
  Administered 2019-05-17 – 2019-05-18 (×2): 50 ug via ORAL
  Filled 2019-05-16 (×2): qty 1

## 2019-05-16 MED ORDER — SODIUM CHLORIDE 0.9% FLUSH: 3.0000 mL | INTRAVENOUS | Status: DC | PRN

## 2019-05-16 MED ORDER — NITROGLYCERIN 0.4 MG SL SUBL: 0.4000 mg | SUBLINGUAL_TABLET | SUBLINGUAL | Status: DC | PRN

## 2019-05-16 MED ORDER — SODIUM CHLORIDE 0.9 % IV SOLN: 250.0000 mL | INTRAVENOUS | Status: DC | PRN

## 2019-05-16 MED ORDER — HYDRALAZINE HCL 50 MG PO TABS
100.0000 mg | ORAL_TABLET | Freq: Three times a day (TID) | ORAL | Status: DC
  Filled 2019-05-16: qty 2

## 2019-05-16 MED ORDER — CLOPIDOGREL BISULFATE 75 MG PO TABS
75.0000 mg | ORAL_TABLET | Freq: Every day | ORAL | Status: DC
  Administered 2019-05-17 – 2019-05-18 (×2): 75 mg via ORAL
  Filled 2019-05-16 (×2): qty 1

## 2019-05-16 MED ORDER — ATORVASTATIN CALCIUM 40 MG PO TABS: 80.0000 mg | ORAL_TABLET | Freq: Every day | ORAL | Status: DC

## 2019-05-16 MED ORDER — VITAMIN D 25 MCG (1000 UNIT) PO TABS
1000.0000 [IU] | ORAL_TABLET | Freq: Every day | ORAL | Status: DC
  Administered 2019-05-17 – 2019-05-18 (×2): 1000 [IU] via ORAL
  Filled 2019-05-16 (×3): qty 1

## 2019-05-16 MED ORDER — OMEGA-3-ACID ETHYL ESTERS 1 G PO CAPS
2000.0000 mg | ORAL_CAPSULE | Freq: Two times a day (BID) | ORAL | Status: DC
  Administered 2019-05-17 – 2019-05-18 (×3): 2000 mg via ORAL
  Filled 2019-05-16 (×4): qty 2

## 2019-05-16 MED ORDER — ONDANSETRON HCL 4 MG/2ML IJ SOLN: 4.0000 mg | Freq: Four times a day (QID) | INTRAMUSCULAR | Status: DC | PRN

## 2019-05-16 MED ORDER — MAGNESIUM OXIDE 400 MG PO TABS
420.0000 mg | ORAL_TABLET | Freq: Every day | ORAL | Status: DC
  Administered 2019-05-17 – 2019-05-18 (×2): 400 mg via ORAL
  Filled 2019-05-16 (×6): qty 1

## 2019-05-16 MED ORDER — FUROSEMIDE 10 MG/ML IJ SOLN
20.0000 mg | Freq: Once | INTRAMUSCULAR | Status: AC
  Administered 2019-05-16: 20 mg via INTRAVENOUS
  Filled 2019-05-16: qty 2

## 2019-05-16 MED ORDER — ONDANSETRON HCL 4 MG PO TABS: 4.0000 mg | ORAL_TABLET | Freq: Four times a day (QID) | ORAL | Status: DC | PRN

## 2019-05-16 MED ORDER — ALBUTEROL SULFATE (2.5 MG/3ML) 0.083% IN NEBU: 2.5000 mg | INHALATION_SOLUTION | RESPIRATORY_TRACT | Status: DC | PRN

## 2019-05-16 MED ORDER — FUROSEMIDE 10 MG/ML IJ SOLN
40.0000 mg | Freq: Two times a day (BID) | INTRAMUSCULAR | Status: DC
  Administered 2019-05-16 – 2019-05-18 (×4): 40 mg via INTRAVENOUS
  Filled 2019-05-16 (×4): qty 4

## 2019-05-16 MED ORDER — AMLODIPINE BESYLATE 5 MG PO TABS: 10.0000 mg | ORAL_TABLET | Freq: Every day | ORAL | Status: DC

## 2019-05-16 MED ORDER — ISOSORBIDE MONONITRATE ER 60 MG PO TB24
120.0000 mg | ORAL_TABLET | Freq: Every day | ORAL | Status: DC
  Filled 2019-05-16: qty 2

## 2019-05-16 MED ORDER — TAMSULOSIN HCL 0.4 MG PO CAPS
0.4000 mg | ORAL_CAPSULE | Freq: Every day | ORAL | Status: DC
  Administered 2019-05-17 – 2019-05-18 (×2): 0.4 mg via ORAL
  Filled 2019-05-16 (×2): qty 1

## 2019-05-16 NOTE — ED Provider Notes (Signed)
[facility] EMERGENCY DEPARTMENT Provider Note   CSN: [id number] Arrival date & time: 05/16/19  0920     History Chief Complaint  Patient presents with   Shortness of Breath    [name] is a 73 y.o. male.  HPI      [name] is a 73 y.o. male with past medical history of COPD, coronary artery disease s/p CABG 2008, and 2018, NSTEMI in 2019,  hypertension, and previous stroke who presents to the Emergency Department complaining of increasing shortness of breath upon waking this morning.  He also notes having weakness to his right arm and hand and mild frontal headache.  No visual changes or dizziness.  Woke at 6 AM this morning with current symptoms.  States that "my right hand does not do what I want to do."  No weakness of his face or lower extremities, no changes to speech or recall.   Shortness of breath has been persistent for several months, but worse this morning.  He notes having some mild increased edema of his bilateral lower extremities.  Shortness of breath is worse with exertion.  He does take Lasix daily.  He denies known Covid exposures, fever, chills, cough and chest pain.   Past Medical History:  Diagnosis Date   Acute myocardial infarction of other inferior wall, initial episode of care    Anginal pain ([place])    Anxiety    Arthritis    Cancer ([place])    bladder 2013 removed, has cystoscopy 10/2016, has returned x 2   COPD (chronic obstructive pulmonary disease) ([place])    Coronary artery disease    Artery bypass graft Jan 2008   Dyspnea    Dysrhythmia    GERD (gastroesophageal reflux disease)    Heart murmur    History of hiatal hernia    Hyperlipidemia    Hypertension    Hypothyroidism    PONV (postoperative nausea and vomiting)    Sleep apnea    Stroke [facility])    Tobacco user     Patient Active Problem List   Diagnosis Date Noted   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/19/2016   NSTEMI (non-ST elevated myocardial infarction) ([place]) 09/14/2016    Chest pain 09/14/2016   Obstructive sleep apnea 09/14/2016   Acute myocardial infarction of other inferior wall, initial episode of care    Unstable angina ([place]) 01/30/2014   OVERWEIGHT/OBESITY 02/02/2010   HYPERLIPIDEMIA, MIXED 01/22/2009   OTHER CHEST PAIN 01/22/2009   TOBACCO USER 01/17/2009   Essential hypertension 01/17/2009    Past Surgical History:  Procedure Laterality Date   APPENDECTOMY     CARDIAC CATHETERIZATION     CORONARY ARTERY BYPASS GRAFT  06/14/2006   x 5   CORONARY ARTERY BYPASS GRAFT N/A 09/19/2016   Procedure: REDO CORONARY ARTERY BYPASS GRAFTING (CABG) x two, using right internal mammary artery and left radial artery;  Surgeon: [name], MD;  Location: [place];  Service: Open Heart Surgery;  Laterality: N/A;   FOOT SURGERY     HERNIA REPAIR     LEFT HEART CATH AND CORS/GRAFTS ANGIOGRAPHY N/A 09/14/2016   Procedure: Left Heart Cath and Cors/Grafts Angiography;  Surgeon: Troy Sine, MD;  Location: [place] CV LAB;  Service: Cardiovascular;  Laterality: N/A;   LEFT HEART CATHETERIZATION WITH CORONARY/GRAFT ANGIOGRAM N/A 01/31/2014   Procedure: LEFT HEART CATHETERIZATION WITH Beatrix Fetters;  Surgeon: Sanda Klein, MD;  Location: [place] CATH LAB;  Service: Cardiovascular;  Laterality: N/A;   NOSE  SURGERY     PERCUTANEOUS CORONARY STENT INTERVENTION (PCI-S)  01/31/2014   Procedure: PERCUTANEOUS CORONARY STENT INTERVENTION (PCI-S);  Surgeon: Sanda Klein, MD;  Location: [facility] CATH LAB;  Service: Cardiovascular;;   RADIAL ARTERY HARVEST Left 09/19/2016   Procedure: RADIAL ARTERY HARVEST;  Surgeon: [name], MD;  Location: [place];  Service: Open Heart Surgery;  Laterality: Left;   TEE WITHOUT CARDIOVERSION N/A 09/19/2016   Procedure: TRANSESOPHAGEAL ECHOCARDIOGRAM (TEE);  Surgeon: [name], MD;  Location: [place];  Service: Open Heart Surgery;  Laterality: N/A;       Family History  Problem Relation Age of Onset   Heart  attack Mother    Heart attack Brother     Social History   Tobacco Use   Smoking status: Former Smoker    Packs/day: 1.00    Years: 40.00    Pack years: 40.00    Types: Cigarettes    Start date: 02/20/1959    Quit date: 09/13/2016    Years since quitting: 2.6   Smokeless tobacco: Never Used   Tobacco comment: 10/7 2-8 cigarettes per day. cutting back- AJ  Substance Use Topics   Alcohol use: No    Alcohol/week: 0.0 standard drinks    Comment: "No, not really"   Drug use: No    Home Medications Prior to Admission medications   Medication Sig Start Date End Date Taking? Authorizing Provider  acetaminophen (TYLENOL) 325 MG tablet Take 2 tablets (650 mg total) by mouth every 6 (six) hours as needed for mild pain. 09/23/16   Nani Skillern, PA-C  amLODipine (NORVASC) 10 MG tablet Take 10 mg by mouth daily.    [provider]  atorvastatin (LIPITOR) 80 MG tablet Take 80 mg by mouth daily.    [provider]  clopidogrel (PLAVIX) 75 MG tablet Take 75 mg by mouth daily.    [provider]  cyanocobalamin (,VITAMIN B-12,) 1000 MCG/ML injection Inject into the muscle.    [provider]  finasteride (PROSCAR) 5 MG tablet Take 5 mg by mouth daily.    [provider]  folic acid (FOLVITE) 1 MG tablet Take by mouth.    [provider]  furosemide (LASIX) 40 MG tablet Take 1 tablet (40 mg total) by mouth 2 (two) times daily. May take an additional 20 mg (1/2 tablet) in the mornings for weight above 217 lbs as needed. 03/19/18   Arnoldo Lenis, MD  hydrALAZINE (APRESOLINE) 100 MG tablet Take 1 tablet (100 mg total) by mouth 3 (three) times daily. 03/08/19   Arnoldo Lenis, MD  isosorbide mononitrate (IMDUR) 120 MG 24 hr tablet Take 120 mg by mouth daily.    [provider]  levothyroxine (SYNTHROID, LEVOTHROID) 50 MCG tablet Take 50 mcg by mouth daily before breakfast.    [provider]  metoprolol  succinate (TOPROL-XL) 25 MG 24 hr tablet Take 25 mg by mouth daily.    [provider]  nitroGLYCERIN (NITROSTAT) 0.4 MG SL tablet Place 0.4 mg under the tongue every 5 (five) minutes as needed for chest pain.    [provider]  Omega-3 Fatty Acids (RA FISH OIL) 1000 MG CAPS Take 2,000 mg by mouth 2 (two) times daily.     [provider]  omeprazole (PRILOSEC) 20 MG capsule Take 40 mg by mouth 2 (two) times daily.     [provider]  tamsulosin (FLOMAX) 0.4 MG CAPS capsule Take 0.4 mg by mouth daily.  [provider]  Vitamin D, Cholecalciferol, 25 MCG (1000 UT) CAPS Take by mouth.    [provider]    Allergies    Cardizem cd [diltiazem hcl er beads]  Review of Systems   Review of Systems  Constitutional: Negative for activity change, appetite change and fever.  HENT: Negative for facial swelling and trouble swallowing.   Eyes: Negative for photophobia, pain and visual disturbance.  Respiratory: Negative for cough, chest tightness and shortness of breath.   Cardiovascular: Positive for leg swelling. Negative for chest pain.  Gastrointestinal: Negative for abdominal pain, nausea and vomiting.  Genitourinary: Negative for dysuria and flank pain.  Musculoskeletal: Negative for neck pain and neck stiffness.  Skin: Negative for rash and wound.  Neurological: Positive for headaches. Negative for dizziness, syncope, facial asymmetry, speech difficulty, weakness and numbness.  Psychiatric/Behavioral: Negative for confusion and decreased concentration.    Physical Exam Updated Vital Signs BP (!) 152/56 (BP Location: Left Arm)    Pulse 63    Temp 97.9 F (36.6 C) (Oral)    Resp 13    Ht 5\' 9"  (1.753 m)    Wt 104.3 kg    SpO2 97%    BMI 33.97 kg/m   Physical Exam Vitals and nursing note reviewed.  Constitutional:      Appearance: Normal appearance. He is well-developed. He is not ill-appearing or toxic-appearing.  HENT:     Head:  Atraumatic.     Mouth/Throat:     Mouth: Mucous membranes are moist.  Eyes:     Extraocular Movements: Extraocular movements intact.     Conjunctiva/sclera: Conjunctivae normal.     Pupils: Pupils are equal, round, and reactive to light.  Cardiovascular:     Rate and Rhythm: Normal rate and regular rhythm.     Pulses: Normal pulses.  Pulmonary:     Effort: Pulmonary effort is normal.     Breath sounds: Normal breath sounds. No wheezing or rales.  Chest:     Chest wall: No tenderness.  Abdominal:     General: There is no distension.     Palpations: Abdomen is soft.     Tenderness: There is no abdominal tenderness.  Musculoskeletal:     Cervical back: Normal range of motion.     Right lower leg: Edema present.     Left lower leg: Edema present.     Comments: Pitting edema of the bilateral lower extremities with right greater than left.  This is baseline per patient.  Negative Homans' sign.  No erythema or excessive warmth of the LE's  Skin:    General: Skin is warm.     Capillary Refill: Capillary refill takes less than 2 seconds.     Findings: No erythema or rash.  Neurological:     Mental Status: He is alert and oriented to person, place, and time.     GCS: GCS eye subscore is 4. GCS verbal subscore is 5. GCS motor subscore is 6.     Cranial Nerves: No dysarthria.     Sensory: Sensation is intact. No sensory deficit.     Motor: Weakness present.     Comments: CN II-XII intact.  Speech clear, no pronator drift. No facial droop.  nml finger nose testing.  Pt does have some noted weakness in grip strength of the right hand as compared to left.       ED Results / Procedures / Treatments   Labs (all labs ordered are listed, but only abnormal  results are displayed) Labs Reviewed  BASIC METABOLIC PANEL - Abnormal; Notable for the following components:      Result Value   Glucose, Bld 104 (*)    BUN 54 (*)    Creatinine, Ser 2.66 (*)    Calcium 8.6 (*)    GFR calc non Af Amer  23 (*)    GFR calc Af Amer 27 (*)    All other components within normal limits  BRAIN NATRIURETIC PEPTIDE - Abnormal; Notable for the following components:   B Natriuretic Peptide 666.0 (*)    All other components within normal limits  CBC WITH DIFFERENTIAL/PLATELET - Abnormal; Notable for the following components:   RBC 2.94 (*)    Hemoglobin 8.9 (*)    HCT 27.3 (*)    All other components within normal limits  D-DIMER, QUANTITATIVE (NOT AT [facility]) - Abnormal; Notable for the following components:   D-Dimer, Quant 3.18 (*)    All other components within normal limits  TROPONIN I (HIGH SENSITIVITY) - Abnormal; Notable for the following components:   Troponin I (High Sensitivity) 34 (*)    All other components within normal limits  RESPIRATORY PANEL BY RT PCR (FLU A&B, COVID)  POC SARS CORONAVIRUS 2 AG -  ED  TROPONIN I (HIGH SENSITIVITY)    EKG EKG Interpretation  Date/Time:  Thursday May 16 2019 09:38:29 EST Ventricular Rate:  62 PR Interval:    QRS Duration: 111 QT Interval:  466 QTC Calculation: 474 R Axis:   54 Text Interpretation: Sinus rhythm Minimal ST depression, lateral leads Confirmed by Fredia Sorrow [phone]) on 05/16/2019 10:50:26 AM   Radiology CT Head Wo Contrast  Result Date: 05/16/2019 CLINICAL DATA:  Neuro deficit, acute, stroke suspected. Additional history provided: Sudden onset right arm weakness since 6 a.m., history of stroke. EXAM: CT HEAD WITHOUT CONTRAST TECHNIQUE: Contiguous axial images were obtained from the base of the skull through the vertex without intravenous contrast. COMPARISON:  Head CT 01/14/2019, brain MRI 12/20/2016 FINDINGS: Brain: No evidence of acute intracranial hemorrhage. No acute demarcated cortical infarction. No evidence of intracranial mass. No midline shift or extra-axial fluid collection. Mild ill-defined hypoattenuation within cerebral white matter is nonspecific, but consistent with chronic small vessel ischemic disease.  Redemonstrated chronic lacunar infarcts within the right cerebellum. Multiple small bilateral remote cortical infarcts were better appreciated on prior MRI 12/20/2016. Mild generalized parenchymal atrophy. Vascular: No hyperdense vessel.  Atherosclerotic calcifications. Skull: Normal. Negative for fracture or focal lesion. Sinuses/Orbits: Visualized orbits demonstrate no acute abnormality. Minimal ethmoid sinus mucosal thickening. Trace fluid within bilateral mastoid air cells. IMPRESSION: No CT evidence of acute intracranial abnormality. Generalized parenchymal atrophy and chronic small vessel ischemic disease. Redemonstrated remote lacunar infarcts within the right cerebellum. Multiple small remote bilateral cortical infarcts were better appreciated on prior brain MRI 12/20/2016. Electronically Signed   By: Kellie Simmering DO   On: 05/16/2019 11:31   DG Chest Port 1 View  Result Date: 05/16/2019 CLINICAL DATA:  Shortness of breath EXAM: PORTABLE CHEST 1 VIEW COMPARISON:  10/26/2016 FINDINGS: Increased interstitial markings/faint patchy opacities in the lungs bilaterally, upper lobe predominant. This appearance suggests mild infection. No pleural effusion or pneumothorax. Cardiomegaly. Postsurgical changes related to prior CABG. Thoracic aortic atherosclerosis. Median sternotomy. IMPRESSION: Increased interstitial markings/faint patchy opacities in upper lungs bilaterally, raising concern for mild infection. Electronically Signed   By: Julian Hy M.D.   On: 05/16/2019 10:28    Procedures Procedures (including critical care time)  Medications Ordered in ED  Medications  furosemide (LASIX) injection 20 mg (20 mg Intravenous Given 05/16/19 1220)    ED Course  I have reviewed the triage vital signs and the nursing notes.  Pertinent labs & imaging results that were available during my care of the patient were reviewed by me and considered in my medical decision making (see chart for details).     MDM Rules/Calculators/A&P                      Pt with focal weakness of the right UE.  No facial droop or dysarthria.  He endorses dyspnea worsening since Christmas.  Overall he is well-appearing.  Vital signs reviewed.  He no tachycardia tachypnea or hypoxia.  He does not appear septic. No leukocytosis.    Pt also seen by Dr. Rogene Houston and care plan discussed.  Dyspnea felt to be secondary to CHF although CXR shows patchy opacities. I feel this is less likely infectious.  His respiratory panel is negative for COVID and influenza.  Right UE weakness on exam w/o other focal findings.  CT head negative for acute process. May need MRI. He will likely need admission.  Pt agrees to plan.    Consulted hospitalist, Dr. Maurene Capes, who agrees to admit.    Final Clinical Impression(s) / ED Diagnoses Final diagnoses:  Dyspnea on exertion  COPD exacerbation ([place])  Upper extremity weakness    Rx / DC Orders ED Discharge Orders    None       Kem Parkinson, PA-C 05/16/19 1534    Fredia Sorrow, MD 05/17/19 [phone]

## 2019-05-16 NOTE — ED Notes (Signed)
Patient transported to radiology

## 2019-05-16 NOTE — ED Provider Notes (Signed)
Medical screening examination/treatment/procedure(s) were conducted as a shared visit with non-physician practitioner(s) and myself.  I personally evaluated the patient during the encounter.  EKG Interpretation  Date/Time:  Thursday May 16 2019 09:38:29 EST Ventricular Rate:  62 PR Interval:    QRS Duration: 111 QT Interval:  466 QTC Calculation: 474 R Axis:   54 Text Interpretation: Sinus rhythm Minimal ST depression, lateral leads Confirmed by Fredia Sorrow [phone]) on 05/16/2019 10:50:26 AM   Results for orders placed or performed during the hospital encounter of 94/70/96  Basic metabolic panel  Result Value Ref Range   Sodium 137 135 - 145 mmol/L   Potassium 3.5 3.5 - 5.1 mmol/L   Chloride 98 98 - 111 mmol/L   CO2 25 22 - 32 mmol/L   Glucose, Bld 104 (H) 70 - 99 mg/dL   BUN 54 (H) 8 - 23 mg/dL   Creatinine, Ser 2.66 (H) 0.61 - 1.24 mg/dL   Calcium 8.6 (L) 8.9 - 10.3 mg/dL   GFR calc non Af Amer 23 (L) >60 mL/min   GFR calc Af Amer 27 (L) >60 mL/min   Anion gap 14 5 - 15  Brain natriuretic peptide  Result Value Ref Range   B Natriuretic Peptide 666.0 (H) 0.0 - 100.0 pg/mL  CBC with Differential  Result Value Ref Range   WBC 7.1 4.0 - 10.5 K/uL   RBC 2.94 (L) 4.22 - 5.81 MIL/uL   Hemoglobin 8.9 (L) 13.0 - 17.0 g/dL   HCT 27.3 (L) 39.0 - 52.0 %   MCV 92.9 80.0 - 100.0 fL   MCH 30.3 26.0 - 34.0 pg   MCHC 32.6 30.0 - 36.0 g/dL   RDW 13.4 11.5 - 15.5 %   Platelets 178 150 - 400 K/uL   nRBC 0.0 0.0 - 0.2 %   Neutrophils Relative % 71 %   Neutro Abs 5.0 1.7 - 7.7 K/uL   Lymphocytes Relative 20 %   Lymphs Abs 1.4 0.7 - 4.0 K/uL   Monocytes Relative 8 %   Monocytes Absolute 0.6 0.1 - 1.0 K/uL   Eosinophils Relative 1 %   Eosinophils Absolute 0.1 0.0 - 0.5 K/uL   Basophils Relative 0 %   Basophils Absolute 0.0 0.0 - 0.1 K/uL   Immature Granulocytes 0 %   Abs Immature Granulocytes 0.02 0.00 - 0.07 K/uL  D-dimer, quantitative  Result Value Ref Range   D-Dimer,  Quant 3.18 (H) 0.00 - 0.50 ug/mL-FEU  POC SARS Coronavirus 2 Ag-ED - Nasal Swab (BD Veritor Kit)  Result Value Ref Range   SARS Coronavirus 2 Ag NEGATIVE NEGATIVE  Troponin I (High Sensitivity)  Result Value Ref Range   Troponin I (High Sensitivity) 34 (H) <18 ng/L   CT Head Wo Contrast  Result Date: 05/16/2019 CLINICAL DATA:  Neuro deficit, acute, stroke suspected. Additional history provided: Sudden onset right arm weakness since 6 a.m., history of stroke. EXAM: CT HEAD WITHOUT CONTRAST TECHNIQUE: Contiguous axial images were obtained from the base of the skull through the vertex without intravenous contrast. COMPARISON:  Head CT 01/14/2019, brain MRI 12/20/2016 FINDINGS: Brain: No evidence of acute intracranial hemorrhage. No acute demarcated cortical infarction. No evidence of intracranial mass. No midline shift or extra-axial fluid collection. Mild ill-defined hypoattenuation within cerebral white matter is nonspecific, but consistent with chronic small vessel ischemic disease. Redemonstrated chronic lacunar infarcts within the right cerebellum. Multiple small bilateral remote cortical infarcts were better appreciated on prior MRI 12/20/2016. Mild generalized parenchymal atrophy. Vascular: No hyperdense  vessel.  Atherosclerotic calcifications. Skull: Normal. Negative for fracture or focal lesion. Sinuses/Orbits: Visualized orbits demonstrate no acute abnormality. Minimal ethmoid sinus mucosal thickening. Trace fluid within bilateral mastoid air cells. IMPRESSION: No CT evidence of acute intracranial abnormality. Generalized parenchymal atrophy and chronic small vessel ischemic disease. Redemonstrated remote lacunar infarcts within the right cerebellum. Multiple small remote bilateral cortical infarcts were better appreciated on prior brain MRI 12/20/2016. Electronically Signed   By: Kellie Simmering DO   On: 05/16/2019 11:31   DG Chest Port 1 View  Result Date: 05/16/2019 CLINICAL DATA:  Shortness  of breath EXAM: PORTABLE CHEST 1 VIEW COMPARISON:  10/26/2016 FINDINGS: Increased interstitial markings/faint patchy opacities in the lungs bilaterally, upper lobe predominant. This appearance suggests mild infection. No pleural effusion or pneumothorax. Cardiomegaly. Postsurgical changes related to prior CABG. Thoracic aortic atherosclerosis. Median sternotomy. IMPRESSION: Increased interstitial markings/faint patchy opacities in upper lungs bilaterally, raising concern for mild infection. Electronically Signed   By: Julian Hy M.D.   On: 05/16/2019 10:28    Patient seen by me along with physician assistant.  Patient brought in by family member.  Complained of shortness of breath this morning.  Denied any fever chills or being around any weight sick.  Patient has bilateral leg swelling.  Patient [facility] the [facility].  Patient is on Lasix.  Past medical history is significant for coronary artery disease hypertension COPD had coronary artery bypass graft in 2008.  Patient denies any chest pain.  Patient had a left heart cath in 2015 had a stent placed at that time.  Work-up here D-dimer elevated but patient's creatinine is much worse than usual GFR correlates with it.  Not a candidate for CT angio.  Could have VQ scan.  But patient not tachycardic.  Just the subjective symptoms of shortness of breath.  Oxygen sats on room air are fine.  They are in the upper 90s.  The other concern is got an elevated troponin in the 30 range.  Delta troponin is pending.  But with the him starting out with that elevation may very well require admission for rule out.  Patient's been informed that he will need admission.  In addition patient's BMP is elevated at 666.  In summary patient has an elevated troponin as a point of care Covid test is negative has an elevated BNP and has acute kidney injury.  In addition patient's chest x-ray has bilateral patchy infiltrates which could represent a COVID-19 infection.   This also could explain the elevated D-dimer.   Fredia Sorrow, MD 05/16/19 1255

## 2019-05-16 NOTE — ED Notes (Signed)
Nuclear Med paged at 1503. Confirmed the VQ scan will take place today to rule out PE.

## 2019-05-16 NOTE — ED Triage Notes (Signed)
Patient complains of shortness of breath this morning. Denies fever chills, or being around anyone sick. Denies having covid test in past 14 days.

## 2019-05-16 NOTE — ED Notes (Signed)
Have given pt a meal tray  

## 2019-05-16 NOTE — H&P (Signed)
Patient Demographics:    [name], is a 73 y.o. male  MRN: [id number]   DOB - 1945-06-27  Admit Date - 05/16/2019  [facility]patient Primary [facility] the [facility] is [facility]:    Active Problems:   AKI (acute kidney injury) ([place])    1)HFpEF--patient with history of chronic diastolic dysfunction CHF please see echo from 12/29/2016 with EF of 65 to 70% with grade 2 diastolic dysfunction --Now presenting with worsening acute on chronic CHF findings -Treat empirically with IV Lasix, daily weights, fluid input and output monitoring, repeat echo requested -BNP elevated at 66 chest x-ray noted -Troponin noted  2) dyspnea and elevated D-dimer--- VQ scan negative for PE, suspect dyspnea secondary to #1 above -COVID-19 test negative  3)AKI----acute kidney injury on CKD stage -III--worsening renal function may be due to decreased renal perfusion in the setting of CHF     creatinine on admission= 2.66  ,   baseline creatinine =1.6 (09/2016)-- patient goes to [facility], so old records not available --no recent baseline available ,   , renally adjust medications, avoid nephrotoxic agents/dehydration/hypotension  4)CAD-prior CABG in 2008, LHC in 2015 with angioplasty and stenting --- currently chest pain-free,  5)Rt arm/hand weakness---CT head neg, symptoms resolving, low index of suspicion for CVA   With History of - Reviewed by me  Past Medical History:  Diagnosis Date  . Acute myocardial infarction of other inferior wall, initial episode of care   . Anginal pain ([place])   . Anxiety   . Arthritis   . Cancer [facility])    bladder 2013 removed, has cystoscopy 10/2016, has returned x 2  . COPD (chronic obstructive pulmonary disease) ([place])   . Coronary artery disease    Artery bypass graft Jan  2008  . Dyspnea   . Dysrhythmia   . GERD (gastroesophageal reflux disease)   . Heart murmur   . History of hiatal hernia   . Hyperlipidemia   . Hypertension   . Hypothyroidism   . PONV (postoperative nausea and vomiting)   . Sleep apnea   . Stroke ([place])   . Tobacco user       Past Surgical History:  Procedure Laterality Date  . APPENDECTOMY    . CARDIAC CATHETERIZATION    . CORONARY ARTERY BYPASS GRAFT  06/14/2006   x 5  . CORONARY ARTERY BYPASS GRAFT N/A 09/19/2016   Procedure: REDO CORONARY ARTERY BYPASS GRAFTING (CABG) x two, using right internal mammary artery and left radial artery;  Surgeon: [name], MD;  Location: [facility];  Service: Open Heart Surgery;  Laterality: N/A;  . FOOT SURGERY    . HERNIA REPAIR    . LEFT HEART CATH AND CORS/GRAFTS ANGIOGRAPHY N/A 09/14/2016   Procedure: Left Heart Cath and Cors/Grafts Angiography;  Surgeon: Troy Sine, MD;  Location: [facility] CV LAB;  Service: Cardiovascular;  Laterality: N/A;  . LEFT HEART  CATHETERIZATION WITH CORONARY/GRAFT ANGIOGRAM N/A 01/31/2014   Procedure: LEFT HEART CATHETERIZATION WITH Beatrix Fetters;  Surgeon: Sanda Klein, MD;  Location: [facility] CATH LAB;  Service: Cardiovascular;  Laterality: N/A;  . NOSE SURGERY    . PERCUTANEOUS CORONARY STENT INTERVENTION (PCI-S)  01/31/2014   Procedure: PERCUTANEOUS CORONARY STENT INTERVENTION (PCI-S);  Surgeon: Sanda Klein, MD;  Location: [facility] CATH LAB;  Service: Cardiovascular;;  . RADIAL ARTERY HARVEST Left 09/19/2016   Procedure: RADIAL ARTERY HARVEST;  Surgeon: [name], MD;  Location: [facility];  Service: Open Heart Surgery;  Laterality: Left;  . TEE WITHOUT CARDIOVERSION N/A 09/19/2016   Procedure: TRANSESOPHAGEAL ECHOCARDIOGRAM (TEE);  Surgeon: [name], MD;  Location: [facility];  Service: Open Heart Surgery;  Laterality: N/A;      Chief Complaint  Patient presents with  . Shortness of Breath      HPI:    [name]  is a 73 y.o. male with past  medical history relevant for CAD, COPD, HTN, BPH, presents with concerns about shortness of breath without chest pain No fever  Or chills ,  No Nausea, Vomiting or Diarrhea -Also complains of rt arm/hand weakness---CT head neg  BNP 666, Troponin 34, delta troponin  29, chest x-ray noted -Chemistry remarkable for elevated creatinine of 2.6 patient goes to [facility], so old records not available -WBC 7.1 Elevated D-dimer noted, but VQ scan negative EDP requested hospitalization for CHF exacerbation,   Review of systems:    In addition to the HPI above,   A full Review of  Systems was done, all other systems reviewed are negative except as noted above in HPI , .    Social History:  Reviewed by me    Social History   Tobacco Use  . Smoking status: Former Smoker    Packs/day: 1.00    Years: 40.00    Pack years: 40.00    Types: Cigarettes    Start date: 02/20/1959    Quit date: 09/13/2016    Years since quitting: 2.6  . Smokeless tobacco: Never Used  . Tobacco comment: 10/7 2-8 cigarettes per day. cutting back- AJ  Substance Use Topics  . Alcohol use: No    Alcohol/week: 0.0 standard drinks    Comment: "No, not really"       Family History :  Reviewed by me    Family History  Problem Relation Age of Onset  . Heart attack Mother   . Heart attack Brother      Home Medications:   Prior to Admission medications   Medication Sig Start Date End Date Taking? Authorizing Provider  acetaminophen (TYLENOL) 325 MG tablet Take 2 tablets (650 mg total) by mouth every 6 (six) hours as needed for mild pain. 09/23/16  Yes Lars Pinks M, PA-C  allopurinol (ZYLOPRIM) 100 MG tablet Take 100 mg by mouth 2 (two) times daily. 05/03/19  Yes [provider]  amLODipine (NORVASC) 10 MG tablet Take 10 mg by mouth daily.   Yes [provider]  atorvastatin (LIPITOR) 80 MG tablet Take 80 mg by mouth daily.   Yes [provider]  clopidogrel (PLAVIX) 75 MG  tablet Take 75 mg by mouth daily.   Yes [provider]  finasteride (PROSCAR) 5 MG tablet Take 5 mg by mouth daily.   Yes [provider]  folic acid (FOLVITE) 1 MG tablet Take by mouth.   Yes [provider]  furosemide (LASIX) 40 MG tablet Take 1 tablet (40 mg  total) by mouth 2 (two) times daily. May take an additional 20 mg (1/2 tablet) in the mornings for weight above 217 lbs as needed. 03/19/18  Yes BranchAlphonse Guild, MD  hydrALAZINE (APRESOLINE) 100 MG tablet Take 1 tablet (100 mg total) by mouth 3 (three) times daily. 03/08/19  Yes BranchAlphonse Guild, MD  isosorbide mononitrate (IMDUR) 120 MG 24 hr tablet Take 120 mg by mouth daily.   Yes [provider]  levothyroxine (SYNTHROID, LEVOTHROID) 50 MCG tablet Take 50 mcg by mouth daily before breakfast.   Yes [provider]  metoprolol succinate (TOPROL-XL) 25 MG 24 hr tablet Take 12.5 mg by mouth 2 (two) times daily.    Yes [provider]  nitroGLYCERIN (NITROSTAT) 0.4 MG SL tablet Place 0.4 mg under the tongue every 5 (five) minutes as needed for chest pain.   Yes [provider]  Omega-3 Fatty Acids (RA FISH OIL) 1000 MG CAPS Take 2,000 mg by mouth 2 (two) times daily.    Yes [provider]  omeprazole (PRILOSEC) 20 MG capsule Take 40 mg by mouth 2 (two) times daily.    Yes [provider]  tamsulosin (FLOMAX) 0.4 MG CAPS capsule Take 0.4 mg by mouth daily.   Yes [provider]  Vitamin D, Cholecalciferol, 25 MCG (1000 UT) CAPS Take 1 capsule by mouth daily.    Yes [provider]  cyanocobalamin (,VITAMIN B-12,) 1000 MCG/ML injection Inject into the muscle.    [provider]     Allergies:     Allergies  Allergen Reactions  . Cardizem Cd [Diltiazem Hcl Er Beads] Palpitations    "Makes heart skip"     Physical Exam:   Vitals  Blood pressure (!) 144/64, pulse (!) 56, temperature 97.9 F (36.6 C), temperature source  Oral, resp. rate 16, height 5\' 9"  (1.753 m), weight 104.3 kg, SpO2 96 %.  Physical Examination: General appearance - alert,  dyspnea on exertion Mental status - alert, oriented to person, place, and time,  Eyes - sclera anicteric Neck - supple, no JVD elevation , Chest -diminished in bases with bibasilar rales Heart - S1 and S2 normal, regular prior CABG scar Abdomen - soft, nontender, nondistended, no masses or organomegaly Neurological - screening mental status exam normal, neck supple without rigidity, cranial nerves II through XII intact, DTR's normal and symmetric Extremities - + pedal edema noted, intact peripheral pulses  Skin - warm, dry     Data Review:    CBC Recent Labs  Lab 05/16/19 1009  WBC 7.1  HGB 8.9*  HCT 27.3*  PLT 178  MCV 92.9  MCH 30.3  MCHC 32.6  RDW 13.4  LYMPHSABS 1.4  MONOABS 0.6  EOSABS 0.1  BASOSABS 0.0   ------------------------------------------------------------------------------------------------------------------  Chemistries  Recent Labs  Lab 05/16/19 1009  NA 137  K 3.5  CL 98  CO2 25  GLUCOSE 104*  BUN 54*  CREATININE 2.66*  CALCIUM 8.6*   ------------------------------------------------------------------------------------------------------------------ estimated creatinine clearance is 29.9 mL/min (A) (by C-G formula based on SCr of 2.66 mg/dL (H)). ------------------------------------------------------------------------------------------------------------------ No results for input(s): TSH, T4TOTAL, T3FREE, THYROIDAB in the last 72 hours.  Invalid input(s): FREET3   Coagulation profile No results for input(s): INR, PROTIME in the last 168 hours. ------------------------------------------------------------------------------------------------------------------- Recent Labs    05/16/19 1009  DDIMER 3.18*    -------------------------------------------------------------------------------------------------------------------  Cardiac Enzymes No results for input(s): CKMB, TROPONINI, MYOGLOBIN in the last 168 hours.  Invalid input(s): CK ------------------------------------------------------------------------------------------------------------------    Component  Value Date/Time   BNP 666.0 (H) 05/16/2019 1010     ---------------------------------------------------------------------------------------------------------------  Urinalysis    Component Value Date/Time   COLORURINE YELLOW 09/18/2016 1516   APPEARANCEUR CLEAR 09/18/2016 1516   LABSPEC 1.008 09/18/2016 1516   PHURINE 5.0 09/18/2016 1516   GLUCOSEU NEGATIVE 09/18/2016 1516   HGBUR NEGATIVE 09/18/2016 1516   BILIRUBINUR NEGATIVE 09/18/2016 1516   KETONESUR NEGATIVE 09/18/2016 1516   PROTEINUR >=300 (A) 09/18/2016 1516   NITRITE NEGATIVE 09/18/2016 1516   LEUKOCYTESUR NEGATIVE 09/18/2016 1516    ----------------------------------------------------------------------------------------------------------------   Imaging Results:    CT Head Wo Contrast  Result Date: 05/16/2019 CLINICAL DATA:  Neuro deficit, acute, stroke suspected. Additional history provided: Sudden onset right arm weakness since 6 a.m., history of stroke. EXAM: CT HEAD WITHOUT CONTRAST TECHNIQUE: Contiguous axial images were obtained from the base of the skull through the vertex without intravenous contrast. COMPARISON:  Head CT 01/14/2019, brain MRI 12/20/2016 FINDINGS: Brain: No evidence of acute intracranial hemorrhage. No acute demarcated cortical infarction. No evidence of intracranial mass. No midline shift or extra-axial fluid collection. Mild ill-defined hypoattenuation within cerebral white matter is nonspecific, but consistent with chronic small vessel ischemic disease. Redemonstrated chronic lacunar infarcts within the right cerebellum. Multiple small  bilateral remote cortical infarcts were better appreciated on prior MRI 12/20/2016. Mild generalized parenchymal atrophy. Vascular: No hyperdense vessel.  Atherosclerotic calcifications. Skull: Normal. Negative for fracture or focal lesion. Sinuses/Orbits: Visualized orbits demonstrate no acute abnormality. Minimal ethmoid sinus mucosal thickening. Trace fluid within bilateral mastoid air cells. IMPRESSION: No CT evidence of acute intracranial abnormality. Generalized parenchymal atrophy and chronic small vessel ischemic disease. Redemonstrated remote lacunar infarcts within the right cerebellum. Multiple small remote bilateral cortical infarcts were better appreciated on prior brain MRI 12/20/2016. Electronically Signed   By: Kellie Simmering DO   On: 05/16/2019 11:31   DG Chest Port 1 View  Result Date: 05/16/2019 CLINICAL DATA:  Shortness of breath EXAM: PORTABLE CHEST 1 VIEW COMPARISON:  10/26/2016 FINDINGS: Increased interstitial markings/faint patchy opacities in the lungs bilaterally, upper lobe predominant. This appearance suggests mild infection. No pleural effusion or pneumothorax. Cardiomegaly. Postsurgical changes related to prior CABG. Thoracic aortic atherosclerosis. Median sternotomy. IMPRESSION: Increased interstitial markings/faint patchy opacities in upper lungs bilaterally, raising concern for mild infection. Electronically Signed   By: Julian Hy M.D.   On: 05/16/2019 10:28    Radiological Exams on Admission: CT Head Wo Contrast  Result Date: 05/16/2019 CLINICAL DATA:  Neuro deficit, acute, stroke suspected. Additional history provided: Sudden onset right arm weakness since 6 a.m., history of stroke. EXAM: CT HEAD WITHOUT CONTRAST TECHNIQUE: Contiguous axial images were obtained from the base of the skull through the vertex without intravenous contrast. COMPARISON:  Head CT 01/14/2019, brain MRI 12/20/2016 FINDINGS: Brain: No evidence of acute intracranial hemorrhage. No acute  demarcated cortical infarction. No evidence of intracranial mass. No midline shift or extra-axial fluid collection. Mild ill-defined hypoattenuation within cerebral white matter is nonspecific, but consistent with chronic small vessel ischemic disease. Redemonstrated chronic lacunar infarcts within the right cerebellum. Multiple small bilateral remote cortical infarcts were better appreciated on prior MRI 12/20/2016. Mild generalized parenchymal atrophy. Vascular: No hyperdense vessel.  Atherosclerotic calcifications. Skull: Normal. Negative for fracture or focal lesion. Sinuses/Orbits: Visualized orbits demonstrate no acute abnormality. Minimal ethmoid sinus mucosal thickening. Trace fluid within bilateral mastoid air cells. IMPRESSION: No CT evidence of acute intracranial abnormality. Generalized parenchymal atrophy and chronic small vessel ischemic disease. Redemonstrated remote lacunar infarcts within the right cerebellum.  Multiple small remote bilateral cortical infarcts were better appreciated on prior brain MRI 12/20/2016. Electronically Signed   By: Kellie Simmering DO   On: 05/16/2019 11:31   DG Chest Port 1 View  Result Date: 05/16/2019 CLINICAL DATA:  Shortness of breath EXAM: PORTABLE CHEST 1 VIEW COMPARISON:  10/26/2016 FINDINGS: Increased interstitial markings/faint patchy opacities in the lungs bilaterally, upper lobe predominant. This appearance suggests mild infection. No pleural effusion or pneumothorax. Cardiomegaly. Postsurgical changes related to prior CABG. Thoracic aortic atherosclerosis. Median sternotomy. IMPRESSION: Increased interstitial markings/faint patchy opacities in upper lungs bilaterally, raising concern for mild infection. Electronically Signed   By: Julian Hy M.D.   On: 05/16/2019 10:28    DVT Prophylaxis -SCD  AM Labs Ordered, also please review Full Orders  Family Communication: Admission, patients condition and plan of care including tests being ordered have  been discussed with the patient who indicate understanding and agree with the plan   Code Status - Full Code  Likely DC to  home  Condition   stable  Roxan Hockey M.D on 05/16/2019 at 3:01 PM Go to [email or web address] -  for contact info  [facility] - Office  [phone]

## 2019-05-17 ENCOUNTER — Observation Stay (HOSPITAL_BASED_OUTPATIENT_CLINIC_OR_DEPARTMENT_OTHER): Payer: No Typology Code available for payment source

## 2019-05-17 DIAGNOSIS — J441 Chronic obstructive pulmonary disease with (acute) exacerbation: Secondary | ICD-10-CM | POA: Diagnosis present

## 2019-05-17 DIAGNOSIS — N2581 Secondary hyperparathyroidism of renal origin: Secondary | ICD-10-CM | POA: Diagnosis present

## 2019-05-17 DIAGNOSIS — K219 Gastro-esophageal reflux disease without esophagitis: Secondary | ICD-10-CM | POA: Diagnosis present

## 2019-05-17 DIAGNOSIS — E559 Vitamin D deficiency, unspecified: Secondary | ICD-10-CM | POA: Diagnosis present

## 2019-05-17 DIAGNOSIS — I350 Nonrheumatic aortic (valve) stenosis: Secondary | ICD-10-CM | POA: Diagnosis not present

## 2019-05-17 DIAGNOSIS — M1990 Unspecified osteoarthritis, unspecified site: Secondary | ICD-10-CM | POA: Diagnosis present

## 2019-05-17 DIAGNOSIS — Z8551 Personal history of malignant neoplasm of bladder: Secondary | ICD-10-CM | POA: Diagnosis not present

## 2019-05-17 DIAGNOSIS — F419 Anxiety disorder, unspecified: Secondary | ICD-10-CM | POA: Diagnosis present

## 2019-05-17 DIAGNOSIS — R809 Proteinuria, unspecified: Secondary | ICD-10-CM | POA: Diagnosis present

## 2019-05-17 DIAGNOSIS — Z955 Presence of coronary angioplasty implant and graft: Secondary | ICD-10-CM | POA: Diagnosis not present

## 2019-05-17 DIAGNOSIS — I351 Nonrheumatic aortic (valve) insufficiency: Secondary | ICD-10-CM | POA: Diagnosis not present

## 2019-05-17 DIAGNOSIS — I130 Hypertensive heart and chronic kidney disease with heart failure and stage 1 through stage 4 chronic kidney disease, or unspecified chronic kidney disease: Secondary | ICD-10-CM | POA: Diagnosis present

## 2019-05-17 DIAGNOSIS — R531 Weakness: Secondary | ICD-10-CM | POA: Diagnosis present

## 2019-05-17 DIAGNOSIS — I5030 Unspecified diastolic (congestive) heart failure: Secondary | ICD-10-CM | POA: Diagnosis present

## 2019-05-17 DIAGNOSIS — Z8673 Personal history of transient ischemic attack (TIA), and cerebral infarction without residual deficits: Secondary | ICD-10-CM | POA: Diagnosis not present

## 2019-05-17 DIAGNOSIS — Z87891 Personal history of nicotine dependence: Secondary | ICD-10-CM | POA: Diagnosis not present

## 2019-05-17 DIAGNOSIS — N179 Acute kidney failure, unspecified: Secondary | ICD-10-CM | POA: Diagnosis present

## 2019-05-17 DIAGNOSIS — I5033 Acute on chronic diastolic (congestive) heart failure: Secondary | ICD-10-CM | POA: Diagnosis present

## 2019-05-17 DIAGNOSIS — N183 Chronic kidney disease, stage 3 unspecified: Secondary | ICD-10-CM | POA: Diagnosis present

## 2019-05-17 DIAGNOSIS — Z20822 Contact with and (suspected) exposure to covid-19: Secondary | ICD-10-CM | POA: Diagnosis present

## 2019-05-17 DIAGNOSIS — D631 Anemia in chronic kidney disease: Secondary | ICD-10-CM | POA: Diagnosis present

## 2019-05-17 DIAGNOSIS — I252 Old myocardial infarction: Secondary | ICD-10-CM | POA: Diagnosis not present

## 2019-05-17 DIAGNOSIS — E785 Hyperlipidemia, unspecified: Secondary | ICD-10-CM | POA: Diagnosis present

## 2019-05-17 DIAGNOSIS — Z951 Presence of aortocoronary bypass graft: Secondary | ICD-10-CM | POA: Diagnosis not present

## 2019-05-17 DIAGNOSIS — E039 Hypothyroidism, unspecified: Secondary | ICD-10-CM | POA: Diagnosis present

## 2019-05-17 DIAGNOSIS — I2510 Atherosclerotic heart disease of native coronary artery without angina pectoris: Secondary | ICD-10-CM | POA: Diagnosis present

## 2019-05-17 DIAGNOSIS — I10 Essential (primary) hypertension: Secondary | ICD-10-CM | POA: Diagnosis not present

## 2019-05-17 DIAGNOSIS — G4733 Obstructive sleep apnea (adult) (pediatric): Secondary | ICD-10-CM | POA: Diagnosis present

## 2019-05-17 LAB — BASIC METABOLIC PANEL
Anion gap: 12 (ref 5–15)
BUN: 53 mg/dL — ABNORMAL HIGH (ref 8–23)
CO2: 26 mmol/L (ref 22–32)
Calcium: 8.1 mg/dL — ABNORMAL LOW (ref 8.9–10.3)
Chloride: 99 mmol/L (ref 98–111)
Creatinine, Ser: 2.74 mg/dL — ABNORMAL HIGH (ref 0.61–1.24)
GFR calc Af Amer: 26 mL/min — ABNORMAL LOW (ref >60)
GFR calc non Af Amer: 22 mL/min — ABNORMAL LOW (ref >60)
Glucose, Bld: 95 mg/dL (ref 70–99)
Potassium: 3.2 mmol/L — ABNORMAL LOW (ref 3.5–5.1)
Sodium: 137 mmol/L (ref 135–145)

## 2019-05-17 LAB — ECHOCARDIOGRAM COMPLETE
Height: 69 in
Weight: 3689.62 oz

## 2019-05-17 LAB — CBC
HCT: 23 % — ABNORMAL LOW (ref 39.0–52.0)
Hemoglobin: 7.6 g/dL — ABNORMAL LOW (ref 13.0–17.0)
MCH: 30.9 pg (ref 26.0–34.0)
MCHC: 33 g/dL (ref 30.0–36.0)
MCV: 93.5 fL (ref 80.0–100.0)
Platelets: 153 10*3/uL (ref 150–400)
RBC: 2.46 MIL/uL — ABNORMAL LOW (ref 4.22–5.81)
RDW: 13.3 % (ref 11.5–15.5)
WBC: 4.8 10*3/uL (ref 4.0–10.5)
nRBC: 0 % (ref 0.0–0.2)

## 2019-05-17 MED ORDER — POTASSIUM CHLORIDE CRYS ER 20 MEQ PO TBCR
40.0000 meq | EXTENDED_RELEASE_TABLET | ORAL | Status: AC
  Administered 2019-05-17 (×2): 40 meq via ORAL
  Filled 2019-05-17 (×2): qty 2

## 2019-05-17 MED ORDER — LABETALOL HCL 5 MG/ML IV SOLN: 10.0000 mg | INTRAVENOUS | Status: DC | PRN

## 2019-05-17 NOTE — Progress Notes (Signed)
Patient Demographics:    [name], is a 74 y.o. male, DOB - [birth year], DEY:[id number]  Admit date - 05/16/2019   Admitting Physician Antron Seth Denton Brick, MD  Outpatient Primary MD for the patient is [facility], [facility]  LOS - 0   Chief Complaint  Patient presents with  . Shortness of Breath        Subjective:    [name] today has no fevers, no emesis,  No chest pain,  --dyspnea on exertion persist Shortness of breath at rest resolving No productive cough  Assessment  & Plan :    Active Problems:   AKI (acute kidney injury) ([facility])  1)HFpEF--admitted with acute on chronic diastolic dysfunction CHF exacerbation, repeat echo with EF down to 60 to 65% from 65 to 31% with  diastolic dysfunction --Symptomatically improving on IV Lasix,  C/n  daily weights, fluid input and output monitoring,  On admission-BNP elevated at 666 chest x-ray noted -Troponin noted  2) dyspnea and elevated D-dimer--- VQ scan negative for PE, suspect dyspnea secondary to #1 above -COVID-19 test negative  3)AKI----acute kidney injury on CKD stage -III--worsening renal function may be due to decreased renal perfusion in the setting of CHF     creatinine on admission= 2.66  ,   baseline creatinine =1.6 (09/2016)-- patient goes to [facility], so old records not available -Creatinine up to 2.74--on 12 closely with IV diuresis --no recent baseline available ,   , renally adjust medications, avoid nephrotoxic agents/dehydration/hypotension  4)CAD-prior CABG in 2008, LHC in 2015 with angioplasty and stenting --- currently chest pain-free,  5)Rt arm/hand weakness---CT head neg,  low index of suspicion for CVA -Overall resolved   Disposition/Need for in-Hospital Stay- patient unable to be discharged at this time due to --- CHF requiring IV Lasix and monitoring of renal function  Code Status : Full  Family  Communication:   NA (patient is alert, awake and coherent)   Disposition Plan  : TBD  Consults  :  NA  DVT Prophylaxis  :   - Heparin - SCDs   Lab Results  Component Value Date   PLT 153 05/17/2019    Inpatient Medications  Scheduled Meds: . allopurinol  100 mg Oral BID  . amLODipine  10 mg Oral Daily  . atorvastatin  80 mg Oral Daily  . cholecalciferol  1,000 Units Oral Daily  . clopidogrel  75 mg Oral Daily  . ferrous sulfate  325 mg Oral Once per day on Mon Wed Fri  . finasteride  5 mg Oral Daily  . furosemide  40 mg Intravenous Q12H  . heparin  5,000 Units Subcutaneous Q8H  . hydrALAZINE  100 mg Oral TID  . isosorbide mononitrate  120 mg Oral Daily  . levothyroxine  50 mcg Oral QAC breakfast  . magnesium oxide  400 mg Oral Daily  . metoprolol succinate  12.5 mg Oral BID  . omega-3 acid ethyl esters  2,000 mg Oral BID  . pantoprazole  40 mg Oral Daily  . sodium chloride flush  3 mL Intravenous Q12H  . tamsulosin  0.4 mg Oral Daily   Continuous Infusions: . sodium chloride     PRN Meds:.sodium chloride, acetaminophen **OR** acetaminophen, acetaminophen, albuterol, labetalol, nitroGLYCERIN, ondansetron **OR**  ondansetron (ZOFRAN) IV, polyethylene glycol, sodium chloride flush, traZODone    Anti-infectives (From admission, onward)   None        Objective:   Vitals:   05/16/19 2104 05/16/19 2112 05/17/19 0500 05/17/19 0520  BP:    (!) 167/58  Pulse:  63  65  Resp:    18  Temp:    98.3 F (36.8 C)  TempSrc:    Oral  SpO2:    98%  Weight: 104.6 kg  104.6 kg   Height: 5\' 9"  (1.753 m)       Wt Readings from Last 3 Encounters:  05/17/19 104.6 kg  03/05/19 103.9 kg  11/26/18 101.1 kg     Intake/Output Summary (Last 24 hours) at 05/17/2019 1837 Last data filed at 05/17/2019 1131 Gross per 24 hour  Intake 480 ml  Output 1950 ml  Net -1470 ml     Physical Exam  Gen:- Awake Alert, dyspnea on exertion persist HEENT:- [place].AT, No sclera  icterus Neck-Supple Neck, .  Lungs-diminished in bases with bibasilar rales CV- S1, S2 normal, regular  Abd-  +ve B.Sounds, Abd Soft, No tenderness,    Extremity/Skin:- 1+  Edema-TED stockings on pedal pulses present  Psych-affect is appropriate, oriented x3 Neuro-no new focal deficits, no tremors   Data Review:   Micro Results Recent Results (from the past 240 hour(s))  Respiratory Panel by RT PCR (Flu A&B, Covid) - Nasopharyngeal Swab     Status: None   Collection Time: 05/16/19 12:34 PM   Specimen: Nasopharyngeal Swab  Result Value Ref Range Status   SARS Coronavirus 2 by RT PCR NEGATIVE NEGATIVE Final    Comment: (NOTE) SARS-CoV-2 target nucleic acids are NOT DETECTED. The SARS-CoV-2 RNA is generally detectable in upper respiratoy specimens during the acute phase of infection. The lowest concentration of SARS-CoV-2 viral copies this assay can detect is 131 copies/mL. A negative result does not preclude SARS-Cov-2 infection and should not be used as the sole basis for treatment or other patient management decisions. A negative result may occur with  improper specimen collection/handling, submission of specimen other than nasopharyngeal swab, presence of viral mutation(s) within the areas targeted by this assay, and inadequate number of viral copies (<131 copies/mL). A negative result must be combined with clinical observations, patient history, and epidemiological information. The expected result is Negative. Fact Sheet for Patients:  PinkCheek.be Fact Sheet for Healthcare Providers:  GravelBags.it This test is not yet ap proved or cleared by the [place] FDA and  has been authorized for detection and/or diagnosis of SARS-CoV-2 by FDA under an Emergency Use Authorization (EUA). This EUA will remain  in effect (meaning this test can be used) for the duration of the COVID-19 declaration under Section 564(b)(1) of  the Act, 21 U.S.C. section 360bbb-3(b)(1), unless the authorization is terminated or revoked sooner.    Influenza A by PCR NEGATIVE NEGATIVE Final   Influenza B by PCR NEGATIVE NEGATIVE Final    Comment: (NOTE) The Xpert Xpress SARS-CoV-2/FLU/RSV assay is intended as an aid in  the diagnosis of influenza from Nasopharyngeal swab specimens and  should not be used as a sole basis for treatment. Nasal washings and  aspirates are unacceptable for Xpert Xpress SARS-CoV-2/FLU/RSV  testing. Fact Sheet for Patients: PinkCheek.be Fact Sheet for Healthcare Providers: GravelBags.it This test is not yet approved or cleared by the [place] FDA and  has been authorized for detection and/or diagnosis of SARS-CoV-2 by  FDA under an Emergency Use Authorization (  EUA). This EUA will remain  in effect (meaning this test can be used) for the duration of the  Covid-19 declaration under Section 564(b)(1) of the Act, 21  U.S.C. section 360bbb-3(b)(1), unless the authorization is  terminated or revoked. Performed at [facility], [address] [place], [place] [address]     Radiology Reports CT Head Wo Contrast  Result Date: 05/16/2019 CLINICAL DATA:  Neuro deficit, acute, stroke suspected. Additional history provided: Sudden onset right arm weakness since 6 a.m., history of stroke. EXAM: CT HEAD WITHOUT CONTRAST TECHNIQUE: Contiguous axial images were obtained from the base of the skull through the vertex without intravenous contrast. COMPARISON:  Head CT 01/14/2019, brain MRI 12/20/2016 FINDINGS: Brain: No evidence of acute intracranial hemorrhage. No acute demarcated cortical infarction. No evidence of intracranial mass. No midline shift or extra-axial fluid collection. Mild ill-defined hypoattenuation within cerebral white matter is nonspecific, but consistent with chronic small vessel ischemic disease. Redemonstrated chronic lacunar infarcts  within the right cerebellum. Multiple small bilateral remote cortical infarcts were better appreciated on prior MRI 12/20/2016. Mild generalized parenchymal atrophy. Vascular: No hyperdense vessel.  Atherosclerotic calcifications. Skull: Normal. Negative for fracture or focal lesion. Sinuses/Orbits: Visualized orbits demonstrate no acute abnormality. Minimal ethmoid sinus mucosal thickening. Trace fluid within bilateral mastoid air cells. IMPRESSION: No CT evidence of acute intracranial abnormality. Generalized parenchymal atrophy and chronic small vessel ischemic disease. Redemonstrated remote lacunar infarcts within the right cerebellum. Multiple small remote bilateral cortical infarcts were better appreciated on prior brain MRI 12/20/2016. Electronically Signed   By: Kellie Simmering DO   On: 05/16/2019 11:31   NM Pulmonary Perfusion  Result Date: 05/16/2019 CLINICAL DATA:  Dyspnea on exertion.  Shortness of breath. EXAM: NUCLEAR MEDICINE PERFUSION LUNG SCAN TECHNIQUE: Perfusion images were obtained in multiple projections after intravenous injection of radiopharmaceutical. Ventilation scans intentionally deferred if perfusion scan and chest x-ray adequate for interpretation during COVID 19 epidemic. RADIOPHARMACEUTICALS:  1.5 mCi Tc-58m MAA IV COMPARISON:  05/16/2019 chest radiograph FINDINGS: No wedge-shaped perfusion defects to suggest acute pulmonary embolism. IMPRESSION: No evidence of pulmonary embolism. Electronically Signed   By: Abigail Miyamoto M.D.   On: 05/16/2019 17:42   DG Chest Port 1 View  Result Date: 05/16/2019 CLINICAL DATA:  Shortness of breath EXAM: PORTABLE CHEST 1 VIEW COMPARISON:  10/26/2016 FINDINGS: Increased interstitial markings/faint patchy opacities in the lungs bilaterally, upper lobe predominant. This appearance suggests mild infection. No pleural effusion or pneumothorax. Cardiomegaly. Postsurgical changes related to prior CABG. Thoracic aortic atherosclerosis. Median  sternotomy. IMPRESSION: Increased interstitial markings/faint patchy opacities in upper lungs bilaterally, raising concern for mild infection. Electronically Signed   By: Julian Hy M.D.   On: 05/16/2019 10:28   ECHOCARDIOGRAM COMPLETE  Result Date: 05/17/2019   ECHOCARDIOGRAM REPORT   Patient Name:   AHMARI DUERSON Date of Exam: 05/17/2019 Medical Rec #:  [id number]      Height:       69.0 in Accession #:    [id number]     Weight:       230.6 lb Date of Birth:  1945-09-30      BSA:          2.19 m Patient Age:    68 years       BP:           167/58 mmHg Patient Gender: M              HR:           65  bpm. Exam Location:  [facility] Procedure: 2D Echo Indications:    Dyspnea 786.09 / R06.00  History:        Patient has prior history of Echocardiogram examinations, most                 recent 12/29/2016. Previous Myocardial Infarction, COPD and                 Stroke, Signs/Symptoms:Chest Pain and Murmur; Risk                 Factors:Hypertension, Dyslipidemia and Former Smoker.  Sonographer:    Leavy Cella RDCS (AE) Referring Phys: [id number] Cyera Balboni IMPRESSIONS  1. Left ventricular ejection fraction, by visual estimation, is 60 to 65%. The left ventricle has normal function. There is moderately increased left ventricular hypertrophy.  2. Left ventricular diastolic parameters are consistent with Grade I diastolic dysfunction (impaired relaxation).  3. The left ventricle has no regional wall motion abnormalities.  4. Global right ventricle has normal systolic function.The right ventricular size is normal. No increase in right ventricular wall thickness.  5. Left atrial size was severely dilated.  6. Right atrial size was normal.  7. The mitral valve is grossly normal. Trivial mitral valve regurgitation.  8. The tricuspid valve is grossly normal.  9. The aortic valve is tricuspid. Aortic valve regurgitation is mild. Mild to moderate aortic valve stenosis. 10. Aortic valve area, by VTI measures 1.40 cm.  11. Aortic valve mean gradient measures 17.3 mmHg. 12. The pulmonic valve was grossly normal. Pulmonic valve regurgitation is trivial. 13. Moderately elevated pulmonary artery systolic pressure. 14. The tricuspid regurgitant velocity is 3.37 m/s, and with an assumed right atrial pressure of 8 mmHg, the estimated right ventricular systolic pressure is moderately elevated at 53.4 mmHg. 15. The inferior vena cava is dilated in size with >50% respiratory variability, suggesting right atrial pressure of 8 mmHg. 16. Evidence of atrial level shunting detected by color flow Doppler. FINDINGS  Left Ventricle: Left ventricular ejection fraction, by visual estimation, is 60 to 65%. The left ventricle has normal function. The left ventricle has no regional wall motion abnormalities. There is moderately increased left ventricular hypertrophy. Left ventricular diastolic parameters are consistent with Grade I diastolic dysfunction (impaired relaxation). Right Ventricle: The right ventricular size is normal. No increase in right ventricular wall thickness. Global RV systolic function is has normal systolic function. The tricuspid regurgitant velocity is 3.37 m/s, and with an assumed right atrial pressure  of 8 mmHg, the estimated right ventricular systolic pressure is moderately elevated at 53.4 mmHg. Left Atrium: Left atrial size was severely dilated. Right Atrium: Right atrial size was normal in size Pericardium: There is no evidence of pericardial effusion. Mitral Valve: The mitral valve is grossly normal. Trivial mitral valve regurgitation. Tricuspid Valve: The tricuspid valve is grossly normal. Tricuspid valve regurgitation is trivial. Aortic Valve: The aortic valve is tricuspid. Aortic valve regurgitation is mild. Aortic regurgitation PHT measures 353 msec. Mild to moderate aortic stenosis is present. Moderate aortic valve annular calcification. Aortic valve mean gradient measures 17.3 mmHg. Aortic valve peak gradient  measures 29.7 mmHg. Aortic valve area, by VTI measures 1.40 cm. Pulmonic Valve: The pulmonic valve was grossly normal. Pulmonic valve regurgitation is trivial. Pulmonic regurgitation is trivial. Aorta: The aortic root is normal in size and structure. Venous: The inferior vena cava is dilated in size with greater than 50% respiratory variability, suggesting right atrial pressure of 8 mmHg. IAS/Shunts: Evidence of atrial level  shunting detected by color flow Doppler.  LEFT VENTRICLE PLAX 2D LVIDd:         4.82 cm  Diastology LVIDs:         2.98 cm  LV e' lateral:   7.62 cm/s LV PW:         1.74 cm  LV E/e' lateral: 15.0 LV IVS:        1.49 cm  LV e' medial:    5.22 cm/s LVOT diam:     2.20 cm  LV E/e' medial:  21.8 LV SV:         74 ml LV SV Index:   32.32 LVOT Area:     3.80 cm  RIGHT VENTRICLE RV S prime:     14.50 cm/s TAPSE (M-mode): 2.0 cm LEFT ATRIUM              Index       RIGHT ATRIUM           Index LA diam:        5.00 cm  2.28 cm/m  RA Area:     13.70 cm LA Vol (A2C):   72.7 ml  33.12 ml/m RA Volume:   34.10 ml  15.54 ml/m LA Vol (A4C):   130.0 ml 59.23 ml/m LA Biplane Vol: 103.0 ml 46.93 ml/m  AORTIC VALVE AV Area (Vmax):    1.43 cm AV Area (Vmean):   1.43 cm AV Area (VTI):     1.40 cm AV Vmax:           272.33 cm/s AV Vmean:          195.333 cm/s AV VTI:            0.743 m AV Peak Grad:      29.7 mmHg AV Mean Grad:      17.3 mmHg LVOT Vmax:         102.67 cm/s LVOT Vmean:        73.533 cm/s LVOT VTI:          0.275 m LVOT/AV VTI ratio: 0.37 AI PHT:            353 msec  AORTA Ao Root diam: 3.60 cm MITRAL VALVE                         TRICUSPID VALVE MV Area (PHT): 2.01 cm              TR Peak grad:   45.4 mmHg MV PHT:        109.33 msec           TR Vmax:        337.00 cm/s MV Decel Time: 377 msec MV E velocity: 114.00 cm/s 103 cm/s  SHUNTS MV A velocity: 113.00 cm/s 70.3 cm/s Systemic VTI:  0.27 m MV E/A ratio:  1.01        1.5       Systemic Diam: 2.20 cm  Rozann Lesches MD Electronically  signed by Rozann Lesches MD Signature Date/Time: 05/17/2019/10:41:55 AM    Final      CBC Recent Labs  Lab 05/16/19 1009 05/17/19 0601  WBC 7.1 4.8  HGB 8.9* 7.6*  HCT 27.3* 23.0*  PLT 178 153  MCV 92.9 93.5  MCH 30.3 30.9  MCHC 32.6 33.0  RDW 13.4 13.3  LYMPHSABS 1.4  --   MONOABS 0.6  --   EOSABS 0.1  --   BASOSABS 0.0  --  Chemistries  Recent Labs  Lab 05/16/19 1009 05/17/19 0601  NA 137 137  K 3.5 3.2*  CL 98 99  CO2 25 26  GLUCOSE 104* 95  BUN 54* 53*  CREATININE 2.66* 2.74*  CALCIUM 8.6* 8.1*   ------------------------------------------------------------------------------------------------------------------ No results for input(s): CHOL, HDL, LDLCALC, TRIG, CHOLHDL, LDLDIRECT in the last 72 hours.  Lab Results  Component Value Date   HGBA1C 5.8 (H) 09/18/2016   ------------------------------------------------------------------------------------------------------------------ No results for input(s): TSH, T4TOTAL, T3FREE, THYROIDAB in the last 72 hours.  Invalid input(s): FREET3 ------------------------------------------------------------------------------------------------------------------ No results for input(s): VITAMINB12, FOLATE, FERRITIN, TIBC, IRON, RETICCTPCT in the last 72 hours.  Coagulation profile No results for input(s): INR, PROTIME in the last 168 hours.  Recent Labs    05/16/19 1009  DDIMER 3.18*    Cardiac Enzymes No results for input(s): CKMB, TROPONINI, MYOGLOBIN in the last 168 hours.  Invalid input(s): CK ------------------------------------------------------------------------------------------------------------------    Component Value Date/Time   BNP 666.0 (H) 05/16/2019 1010     Roxan Hockey M.D on 05/17/2019 at 6:37 PM  Go to [email or web address] - for contact info  [facility] - Office  [phone]

## 2019-05-17 NOTE — Care Management Obs Status (Signed)
[place] NOTIFICATION   Patient Details  Name: [name] MRN: [id number] Date of Birth: 01/27/1946   Medicare Observation Status Notification Given:  Yes    Tommy Medal 05/17/2019, 2:28 PM

## 2019-05-17 NOTE — Progress Notes (Signed)
*  PRELIMINARY RESULTS* Echocardiogram 2D Echocardiogram has been performed.  Leavy Cella 05/17/2019, 9:37 AM

## 2019-05-18 ENCOUNTER — Inpatient Hospital Stay (HOSPITAL_COMMUNITY): Payer: No Typology Code available for payment source

## 2019-05-18 DIAGNOSIS — I5033 Acute on chronic diastolic (congestive) heart failure: Secondary | ICD-10-CM

## 2019-05-18 DIAGNOSIS — I10 Essential (primary) hypertension: Secondary | ICD-10-CM

## 2019-05-18 LAB — RENAL FUNCTION PANEL
Albumin: 2.6 g/dL — ABNORMAL LOW (ref 3.5–5.0)
Anion gap: 12 (ref 5–15)
BUN: 51 mg/dL — ABNORMAL HIGH (ref 8–23)
CO2: 28 mmol/L (ref 22–32)
Calcium: 8.4 mg/dL — ABNORMAL LOW (ref 8.9–10.3)
Chloride: 101 mmol/L (ref 98–111)
Creatinine, Ser: 2.77 mg/dL — ABNORMAL HIGH (ref 0.61–1.24)
GFR calc Af Amer: 25 mL/min — ABNORMAL LOW (ref >60)
GFR calc non Af Amer: 22 mL/min — ABNORMAL LOW (ref >60)
Glucose, Bld: 95 mg/dL (ref 70–99)
Phosphorus: 3.9 mg/dL (ref 2.5–4.6)
Potassium: 3.7 mmol/L (ref 3.5–5.1)
Sodium: 141 mmol/L (ref 135–145)

## 2019-05-18 LAB — CBC
HCT: 22.7 % — ABNORMAL LOW (ref 39.0–52.0)
Hemoglobin: 7.4 g/dL — ABNORMAL LOW (ref 13.0–17.0)
MCH: 30.7 pg (ref 26.0–34.0)
MCHC: 32.6 g/dL (ref 30.0–36.0)
MCV: 94.2 fL (ref 80.0–100.0)
Platelets: 168 10*3/uL (ref 150–400)
RBC: 2.41 MIL/uL — ABNORMAL LOW (ref 4.22–5.81)
RDW: 13.2 % (ref 11.5–15.5)
WBC: 4.9 10*3/uL (ref 4.0–10.5)
nRBC: 0 % (ref 0.0–0.2)

## 2019-05-18 MED ORDER — HYDRALAZINE HCL 100 MG PO TABS: 100.0000 mg | ORAL_TABLET | Freq: Three times a day (TID) | ORAL | 9 refills | Status: DC

## 2019-05-18 MED ORDER — LOSARTAN POTASSIUM 25 MG PO TABS: 25.0000 mg | ORAL_TABLET | Freq: Every day | ORAL | 1 refills | Status: DC

## 2019-05-18 MED ORDER — FUROSEMIDE 40 MG PO TABS: 60.0000 mg | ORAL_TABLET | Freq: Two times a day (BID) | ORAL | 2 refills | Status: DC

## 2019-05-18 MED ORDER — AMLODIPINE BESYLATE 10 MG PO TABS: 10.0000 mg | ORAL_TABLET | Freq: Every day | ORAL | 3 refills | Status: DC

## 2019-05-18 MED ORDER — CARVEDILOL 6.25 MG PO TABS: 6.2500 mg | ORAL_TABLET | Freq: Two times a day (BID) | ORAL | 3 refills | Status: DC

## 2019-05-18 MED ORDER — ISOSORBIDE MONONITRATE ER 120 MG PO TB24: 120.0000 mg | ORAL_TABLET | Freq: Every day | ORAL | 5 refills | Status: DC

## 2019-05-18 NOTE — Progress Notes (Signed)
SATURATION QUALIFICATIONS: (This note is used to comply with regulatory documentation for home oxygen)  Patient Saturations on Room Air at Rest = 98%  Patient Saturations on Room Air while Ambulating = 97%  HR maintained in the 80's with ambulation, HR in the 70s at rest.

## 2019-05-18 NOTE — Progress Notes (Signed)
IV removed, 2x2 gauze and paper tape applied to site, patient tolerated well, reviewed AVS with patient and patient's wife, both verbalized understanding.  Patient taken to front lobby of hospital via wheelchair and transported home by his wife.

## 2019-05-18 NOTE — Discharge Summary (Signed)
[name], is a 74 y.o. male  DOB 06-14-1945  MRN [id number].  Admission date:  05/16/2019  Admitting Physician  Roxan Hockey, MD  Discharge Date:  05/18/2019   Primary MD  Cl[facility], [facility]  Recommendations for primary care physician for things to follow:   1)Very low-salt diet advised 2)Weigh yourself daily, call if you gain more than 3 pounds in 1 day or more than 5 pounds in 1 week as your diuretic medications may need to be adjusted 3)Limit your Fluid  intake to no more than 60 ounces (1.8 Liters) per day 4) increase Lasix to 60 mg (1 and one Half Tablet) twice a day 5) stop metoprolol/Toprol-XL, start Coreg 6.25 mg twice a day instead 6) follow-[facility] with [facility] for repeat BMP blood test within a week  Admission Diagnosis  Chronic kidney disease, stage IV (severe) (HCC) [N18.4] Dyspnea on exertion [R06.00] Secondary hyperparathyroidism ([place]) [N25.81] COPD exacerbation ([place]) [J44.1] Upper extremity weakness [R29.898] AKI (acute kidney injury) ([place]) [N17.9] Anemia of chronic renal failure, stage 4 (severe) ([place]) [N18.4, D63.1] Vitamin D deficiency, unspecified [E55.9] Proteinuria, unspecified type [R80.9]   Discharge Diagnosis  Chronic kidney disease, stage IV (severe) ([place]) [N18.4] Dyspnea on exertion [R06.00] Secondary hyperparathyroidism ([place]) [N25.81] COPD exacerbation ([place]) [J44.1] Upper extremity weakness [R29.898] AKI (acute kidney injury) ([place]) [N17.9] Anemia of chronic renal failure, stage 4 (severe) ([place]) [N18.4, D63.1] Vitamin D deficiency, unspecified [E55.9] Proteinuria, unspecified type [R80.9]    Active Problems:   (HFpEF) heart failure with preserved ejection fraction (HCC)   AKI (acute kidney injury) ([place])   Essential hypertension   Obstructive sleep apnea   S/P CABG x 2      Past Medical History:  Diagnosis Date  . Acute myocardial infarction of  other inferior wall, initial episode of care   . Anginal pain ([place])   . Anxiety   . Arthritis   . Cancer [facility])    bladder 2013 removed, has cystoscopy 10/2016, has returned x 2  . COPD (chronic obstructive pulmonary disease) ([place])   . Coronary artery disease    Artery bypass graft Jan 2008  . Dyspnea   . Dysrhythmia   . GERD (gastroesophageal reflux disease)   . Heart murmur   . History of hiatal hernia   . Hyperlipidemia   . Hypertension   . Hypothyroidism   . PONV (postoperative nausea and vomiting)   . Sleep apnea   . Stroke ([place])   . Tobacco user     Past Surgical History:  Procedure Laterality Date  . APPENDECTOMY    . CARDIAC CATHETERIZATION    . CORONARY ARTERY BYPASS GRAFT  06/14/2006   x 5  . CORONARY ARTERY BYPASS GRAFT N/A 09/19/2016   Procedure: REDO CORONARY ARTERY BYPASS GRAFTING (CABG) x two, using right internal mammary artery and left radial artery;  Surgeon: [name], MD;  Location: [place];  Service: Open Heart Surgery;  Laterality: N/A;  . FOOT SURGERY    . HERNIA REPAIR    .  LEFT HEART CATH AND CORS/GRAFTS ANGIOGRAPHY N/A 09/14/2016   Procedure: Left Heart Cath and Cors/Grafts Angiography;  Surgeon: Troy Sine, MD;  Location: [place] CV LAB;  Service: Cardiovascular;  Laterality: N/A;  . LEFT HEART CATHETERIZATION WITH CORONARY/GRAFT ANGIOGRAM N/A 01/31/2014   Procedure: LEFT HEART CATHETERIZATION WITH Beatrix Fetters;  Surgeon: Sanda Klein, MD;  Location: [place] CATH LAB;  Service: Cardiovascular;  Laterality: N/A;  . NOSE SURGERY    . PERCUTANEOUS CORONARY STENT INTERVENTION (PCI-S)  01/31/2014   Procedure: PERCUTANEOUS CORONARY STENT INTERVENTION (PCI-S);  Surgeon: Sanda Klein, MD;  Location: [facility] C[facility] LAB;  Service: Cardiovascular;;  . RADIAL ARTERY HARVEST Left 09/19/2016   Procedure: RADIAL ARTERY HARVEST;  Surgeon: [name], MD;  Location: [place];  Service: Open Heart Surgery;  Laterality: Left;  . TEE WITHOUT CARDIOVERSION N/A  09/19/2016   Procedure: TRANSESOPHAGEAL ECHOCARDIOGRAM (TEE);  Surgeon: [name], MD;  Location: [place];  Service: Open Heart Surgery;  Laterality: N/A;     HPI  from the history and physical done on the day of admission:     [name]  is a 74 y.o. male with past medical history relevant for CAD, COPD, HTN, BPH, presents with concerns about shortness of breath without chest pain No fever  Or chills ,  No Nausea, Vomiting or Diarrhea -Also complains of rt arm/hand weakness---CT head neg  BNP 666, Troponin 34, delta troponin  29, chest x-ray noted -Chemistry remarkable for elevated creatinine of 2.6 patient goes to [place], so old records not available -WBC 7.1 Elevated D-dimer noted, but VQ scan negative EDP requested hospitalization for CHF exacerbation,    Hospital Course:    1)HFpEF--admitted with acute on chronic diastolic dysfunction CHF exacerbation, repeat echo with EF down to 60 to 65% from 65 to 24% with  diastolic dysfunction --Symptomatically much improved on IV Lasix,  -Dyspnea at rest resolved, dyspnea with ambulation/exertion mostly resolved, post ambulation O2 sats 97% on room air today -Fluid balance is negative -Discharge on Lasix 60 mg p.o. twice daily, -Follow-up with PCP at [facility] for BMP recheck within a week  2)Dyspnea and Elevated D-dimer---VQ scan negative for PE, suspect dyspnea secondary to #1 above -COVID-19 test negative -Dyspnea resolved, ambulated without hypoxia or dyspnea on exertion  3)AKI----acute kidney injury on CKD stage -III--worsening renal function may be due to decreased renal perfusion in the setting of CHFcreatinine on admission= 2.66, baseline creatinine =1.6 (09/2016)--patient goes to [place] hospital, soold records not available -Creatinine up to 2.77-- with IV diuresis --no recent baseline renal function values available,,  -Follow-up with PCP at [facility] for repeat BMP within a week -Suspect patient may have a CKD with  baseline pretty close to the mid twos  4)CAD-prior CABG in 2008,LHC in 2015 with angioplasty and stenting---currently chest pain-free, -Continue PTA meds  5)Rt Arm/Hand Weakness---CT head neg, low index of suspicion for CVA -Overall resolved  Discharge Condition: Stable  Follow UP--- PCP at [facility] hospital  Diet and Activity recommendation:  As advised  Discharge Instructions    Discharge Instructions    Call MD for:  difficulty breathing, headache or visual disturbances   Complete by: As directed    Call MD for:  persistant dizziness or light-headedness   Complete by: As directed    Call MD for:  persistant nausea and vomiting   Complete by: As directed    Call MD for:  temperature >100.4   Complete by: As directed    Diet - low  sodium heart healthy   Complete by: As directed    Discharge instructions   Complete by: As directed    1)Very low-salt diet advised 2)Weigh yourself daily, call if you gain more than 3 pounds in 1 day or more than 5 pounds in 1 week as your diuretic medications may need to be adjusted 3)Limit your Fluid  intake to no more than 60 ounces (1.8 Liters) per day 4) increase Lasix to 60 mg (1 and one Half Tablet) twice a day 5) stop metoprolol/Toprol-XL, start Coreg 6.25 mg twice a day instead 6) follow-[facility] with your [facility] doctor for repeat BMP blood test within a week   Increase activity slowly   Complete by: As directed        Discharge Medications     Allergies as of 05/18/2019      Reactions   Cardizem Cd [diltiazem Hcl Er Beads] Palpitations   "Makes heart skip"      Medication List    STOP taking these medications   metoprolol succinate 25 MG 24 hr tablet Commonly known as: TOPROL-XL     TAKE these medications   acetaminophen 325 MG tablet Commonly known as: TYLENOL Take 2 tablets (650 mg total) by mouth every 6 (six) hours as needed for mild pain.   allopurinol 100 MG tablet Commonly known as: ZYLOPRIM Take 100 mg by mouth 2  (two) times daily.   amLODipine 10 MG tablet Commonly known as: NORVASC Take 1 tablet (10 mg total) by mouth daily.   atorvastatin 80 MG tablet Commonly known as: LIPITOR Take 80 mg by mouth daily.   carvedilol 6.25 MG tablet Commonly known as: Coreg Take 1 tablet (6.25 mg total) by mouth 2 (two) times daily.   clopidogrel 75 MG tablet Commonly known as: PLAVIX Take 75 mg by mouth daily.   cyanocobalamin 1000 MCG/ML injection Commonly known as: (VITAMIN B-12) Inject into the muscle.   ferrous sulfate 325 (65 FE) MG tablet Take 325 mg by mouth 3 (three) times a week. MWF   finasteride 5 MG tablet Commonly known as: PROSCAR Take 5 mg by mouth daily.   folic acid 1 MG tablet Commonly known as: FOLVITE Take by mouth.   furosemide 40 MG tablet Commonly known as: LASIX Take 1.5 tablets (60 mg total) by mouth 2 (two) times daily. What changed:   how much to take  additional instructions   hydrALAZINE 100 MG tablet Commonly known as: APRESOLINE Take 1 tablet (100 mg total) by mouth 3 (three) times daily.   isosorbide mononitrate 120 MG 24 hr tablet Commonly known as: IMDUR Take 1 tablet (120 mg total) by mouth daily.   levothyroxine 50 MCG tablet Commonly known as: SYNTHROID Take 50 mcg by mouth daily before breakfast.   losartan 25 MG tablet Commonly known as: COZAAR Take 1 tablet (25 mg total) by mouth daily. Start on Monday 05/19/2018 What changed: additional instructions   magnesium oxide 400 MG tablet Commonly known as: MAG-OX Take 420 mg by mouth daily.   nitroGLYCERIN 0.4 MG SL tablet Commonly known as: NITROSTAT Place 0.4 mg under the tongue every 5 (five) minutes as needed for chest pain.   omeprazole 20 MG capsule Commonly known as: PRILOSEC Take 40 mg by mouth 2 (two) times daily.   RA Fish Oil 1000 MG Caps Take 2,000 mg by mouth 2 (two) times daily.   tamsulosin 0.4 MG Caps capsule Commonly known as: FLOMAX Take 0.4 mg by mouth daily.  Vitamin D (Cholecalciferol) 25 MCG (1000 UT) Caps Take 1 capsule by mouth daily.      Major procedures and Radiology Reports - PLEASE review detailed and final reports for all details, in brief -   CT Head Wo Contrast  Result Date: 05/16/2019 CLINICAL DATA:  Neuro deficit, acute, stroke suspected. Additional history provided: Sudden onset right arm weakness since 6 a.m., history of stroke. EXAM: CT HEAD WITHOUT CONTRAST TECHNIQUE: Contiguous axial images were obtained from the base of the skull through the vertex without intravenous contrast. COMPARISON:  Head CT 01/14/2019, brain MRI 12/20/2016 FINDINGS: Brain: No evidence of acute intracranial hemorrhage. No acute demarcated cortical infarction. No evidence of intracranial mass. No midline shift or extra-axial fluid collection. Mild ill-defined hypoattenuation within cerebral white matter is nonspecific, but consistent with chronic small vessel ischemic disease. Redemonstrated chronic lacunar infarcts within the right cerebellum. Multiple small bilateral remote cortical infarcts were better appreciated on prior MRI 12/20/2016. Mild generalized parenchymal atrophy. Vascular: No hyperdense vessel.  Atherosclerotic calcifications. Skull: Normal. Negative for fracture or focal lesion. Sinuses/Orbits: Visualized orbits demonstrate no acute abnormality. Minimal ethmoid sinus mucosal thickening. Trace fluid within bilateral mastoid air cells. IMPRESSION: No CT evidence of acute intracranial abnormality. Generalized parenchymal atrophy and chronic small vessel ischemic disease. Redemonstrated remote lacunar infarcts within the right cerebellum. Multiple small remote bilateral cortical infarcts were better appreciated on prior brain MRI 12/20/2016. Electronically Signed   By: Kellie Simmering DO   On: 05/16/2019 11:31   NM Pulmonary Perfusion  Result Date: 05/16/2019 CLINICAL DATA:  Dyspnea on exertion.  Shortness of breath. EXAM: NUCLEAR MEDICINE PERFUSION  LUNG SCAN TECHNIQUE: Perfusion images were obtained in multiple projections after intravenous injection of radiopharmaceutical. Ventilation scans intentionally deferred if perfusion scan and chest x-ray adequate for interpretation during COVID 19 epidemic. RADIOPHARMACEUTICALS:  1.5 mCi Tc-41m MAA IV COMPARISON:  05/16/2019 chest radiograph FINDINGS: No wedge-shaped perfusion defects to suggest acute pulmonary embolism. IMPRESSION: No evidence of pulmonary embolism. Electronically Signed   By: Abigail Miyamoto M.D.   On: 05/16/2019 17:42   DG Chest Port 1 View  Result Date: 05/16/2019 CLINICAL DATA:  Shortness of breath EXAM: PORTABLE CHEST 1 VIEW COMPARISON:  10/26/2016 FINDINGS: Increased interstitial markings/faint patchy opacities in the lungs bilaterally, upper lobe predominant. This appearance suggests mild infection. No pleural effusion or pneumothorax. Cardiomegaly. Postsurgical changes related to prior CABG. Thoracic aortic atherosclerosis. Median sternotomy. IMPRESSION: Increased interstitial markings/faint patchy opacities in upper lungs bilaterally, raising concern for mild infection. Electronically Signed   By: Julian Hy M.D.   On: 05/16/2019 10:28   ECHOCARDIOGRAM COMPLETE  Result Date: 05/17/2019   ECHOCARDIOGRAM REPORT   Patient Name:   CORRADO HYMON Date of Exam: 05/17/2019 Medical Rec #:  [id number]      Height:       69.0 in Accession #:    [id number]     Weight:       230.6 lb Date of Birth:  November 10, 1945      BSA:          2.19 m Patient Age:    74 years       BP:           167/58 mmHg Patient Gender: M              HR:           65 bpm. Exam Location:  [facility] Procedure: 2D Echo Indications:    Dyspnea 786.09 / R06.00  History:  Patient has prior history of Echocardiogram examinations, most                 recent 12/29/2016. Previous Myocardial Infarction, COPD and                 Stroke, Signs/Symptoms:Chest Pain and Murmur; Risk                 Factors:Hypertension,  Dyslipidemia and Former Smoker.  Sonographer:    Leavy Cella RDCS (AE) Referring Phys: [id number] Charmane Protzman IMPRESSIONS  1. Left ventricular ejection fraction, by visual estimation, is 60 to 65%. The left ventricle has normal function. There is moderately increased left ventricular hypertrophy.  2. Left ventricular diastolic parameters are consistent with Grade I diastolic dysfunction (impaired relaxation).  3. The left ventricle has no regional wall motion abnormalities.  4. Global right ventricle has normal systolic function.The right ventricular size is normal. No increase in right ventricular wall thickness.  5. Left atrial size was severely dilated.  6. Right atrial size was normal.  7. The mitral valve is grossly normal. Trivial mitral valve regurgitation.  8. The tricuspid valve is grossly normal.  9. The aortic valve is tricuspid. Aortic valve regurgitation is mild. Mild to moderate aortic valve stenosis. 10. Aortic valve area, by VTI measures 1.40 cm. 11. Aortic valve mean gradient measures 17.3 mmHg. 12. The pulmonic valve was grossly normal. Pulmonic valve regurgitation is trivial. 13. Moderately elevated pulmonary artery systolic pressure. 14. The tricuspid regurgitant velocity is 3.37 m/s, and with an assumed right atrial pressure of 8 mmHg, the estimated right ventricular systolic pressure is moderately elevated at 53.4 mmHg. 15. The inferior vena cava is dilated in size with >50% respiratory variability, suggesting right atrial pressure of 8 mmHg. 16. Evidence of atrial level shunting detected by color flow Doppler. FINDINGS  Left Ventricle: Left ventricular ejection fraction, by visual estimation, is 60 to 65%. The left ventricle has normal function. The left ventricle has no regional wall motion abnormalities. There is moderately increased left ventricular hypertrophy. Left ventricular diastolic parameters are consistent with Grade I diastolic dysfunction (impaired relaxation). Right  Ventricle: The right ventricular size is normal. No increase in right ventricular wall thickness. Global RV systolic function is has normal systolic function. The tricuspid regurgitant velocity is 3.37 m/s, and with an assumed right atrial pressure  of 8 mmHg, the estimated right ventricular systolic pressure is moderately elevated at 53.4 mmHg. Left Atrium: Left atrial size was severely dilated. Right Atrium: Right atrial size was normal in size Pericardium: There is no evidence of pericardial effusion. Mitral Valve: The mitral valve is grossly normal. Trivial mitral valve regurgitation. Tricuspid Valve: The tricuspid valve is grossly normal. Tricuspid valve regurgitation is trivial. Aortic Valve: The aortic valve is tricuspid. Aortic valve regurgitation is mild. Aortic regurgitation PHT measures 353 msec. Mild to moderate aortic stenosis is present. Moderate aortic valve annular calcification. Aortic valve mean gradient measures 17.3 mmHg. Aortic valve peak gradient measures 29.7 mmHg. Aortic valve area, by VTI measures 1.40 cm. Pulmonic Valve: The pulmonic valve was grossly normal. Pulmonic valve regurgitation is trivial. Pulmonic regurgitation is trivial. Aorta: The aortic root is normal in size and structure. Venous: The inferior vena cava is dilated in size with greater than 50% respiratory variability, suggesting right atrial pressure of 8 mmHg. IAS/Shunts: Evidence of atrial level shunting detected by color flow Doppler.  LEFT VENTRICLE PLAX 2D LVIDd:         4.82 cm  Diastology LVIDs:  2.98 cm  LV e' lateral:   7.62 cm/s LV PW:         1.74 cm  LV E/e' lateral: 15.0 LV IVS:        1.49 cm  LV e' medial:    5.22 cm/s LVOT diam:     2.20 cm  LV E/e' medial:  21.8 LV SV:         74 ml LV SV Index:   32.32 LVOT Area:     3.80 cm  RIGHT VENTRICLE RV S prime:     14.50 cm/s TAPSE (M-mode): 2.0 cm LEFT ATRIUM              Index       RIGHT ATRIUM           Index LA diam:        5.00 cm  2.28 cm/m  RA  Area:     13.70 cm LA Vol (A2C):   72.7 ml  33.12 ml/m RA Volume:   34.10 ml  15.54 ml/m LA Vol (A4C):   130.0 ml 59.23 ml/m LA Biplane Vol: 103.0 ml 46.93 ml/m  AORTIC VALVE AV Area (Vmax):    1.43 cm AV Area (Vmean):   1.43 cm AV Area (VTI):     1.40 cm AV Vmax:           272.33 cm/s AV Vmean:          195.333 cm/s AV VTI:            0.743 m AV Peak Grad:      29.7 mmHg AV Mean Grad:      17.3 mmHg LVOT Vmax:         102.67 cm/s LVOT Vmean:        73.533 cm/s LVOT VTI:          0.275 m LVOT/AV VTI ratio: 0.37 AI PHT:            353 msec  AORTA Ao Root diam: 3.60 cm MITRAL VALVE                         TRICUSPID VALVE MV Area (PHT): 2.01 cm              TR Peak grad:   45.4 mmHg MV PHT:        109.33 msec           TR Vmax:        337.00 cm/s MV Decel Time: 377 msec MV E velocity: 114.00 cm/s 103 cm/s  SHUNTS MV A velocity: 113.00 cm/s 70.3 cm/s Systemic VTI:  0.27 m MV E/A ratio:  1.01        1.5       Systemic Diam: 2.20 cm  Rozann Lesches MD Electronically signed by Rozann Lesches MD Signature Date/Time: 05/17/2019/10:41:55 AM    Final    Micro Results   Recent Results (from the past 240 hour(s))  Respiratory Panel by RT PCR (Flu A&B, Covid) - Nasopharyngeal Swab     Status: None   Collection Time: 05/16/19 12:34 PM   Specimen: Nasopharyngeal Swab  Result Value Ref Range Status   SARS Coronavirus 2 by RT PCR NEGATIVE NEGATIVE Final    Comment: (NOTE) SARS-CoV-2 target nucleic acids are NOT DETECTED. The SARS-CoV-2 RNA is generally detectable in upper respiratoy specimens during the acute phase of infection. The lowest concentration of SARS-CoV-2 viral copies this assay can detect  is 131 copies/mL. A negative result does not preclude SARS-Cov-2 infection and should not be used as the sole basis for treatment or other patient management decisions. A negative result may occur with  improper specimen collection/handling, submission of specimen other than nasopharyngeal swab, presence of  viral mutation(s) within the areas targeted by this assay, and inadequate number of viral copies (<131 copies/mL). A negative result must be combined with clinical observations, patient history, and epidemiological information. The expected result is Negative. Fact Sheet for Patients:  PinkCheek.be Fact Sheet for Healthcare Providers:  GravelBags.it This test is not yet ap proved or cleared by the [place] FDA and  has been authorized for detection and/or diagnosis of SARS-CoV-2 by FDA under an Emergency Use Authorization (EUA). This EUA will remain  in effect (meaning this test can be used) for the duration of the COVID-19 declaration under Section 564(b)(1) of the Act, 21 U.S.C. section 360bbb-3(b)(1), unless the authorization is terminated or revoked sooner.    Influenza A by PCR NEGATIVE NEGATIVE Final   Influenza B by PCR NEGATIVE NEGATIVE Final    Comment: (NOTE) The Xpert Xpress SARS-CoV-2/FLU/RSV assay is intended as an aid in  the diagnosis of influenza from Nasopharyngeal swab specimens and  should not be used as a sole basis for treatment. Nasal washings and  aspirates are unacceptable for Xpert Xpress SARS-CoV-2/FLU/RSV  testing. Fact Sheet for Patients: PinkCheek.be Fact Sheet for Healthcare Providers: GravelBags.it This test is not yet approved or cleared by the [place] FDA and  has been authorized for detection and/or diagnosis of SARS-CoV-2 by  FDA under an Emergency Use Authorization (EUA). This EUA will remain  in effect (meaning this test can be used) for the duration of the  Covid-19 declaration under Section 564(b)(1) of the Act, 21  U.S.C. section 360bbb-3(b)(1), unless the authorization is  terminated or revoked. Performed at [facility], [address], [place], [place] [address]    Today   Subjective    Dean Goldner today has  no new complaints, voiding well, -Ambulating without dyspnea or hypoxia or chest pains   Patient has been seen and examined prior to discharge   Objective   Blood pressure (!) 165/57, pulse (!) 58, temperature 97.6 F (36.4 C), temperature source Oral, resp. rate 18, height 5\' 9"  (1.753 m), weight 103.9 kg, SpO2 98 %.   Intake/Output Summary (Last 24 hours) at 05/18/2019 1108 Last data filed at 05/18/2019 0600 Gross per 24 hour  Intake 120 ml  Output 2400 ml  Net -2280 ml   Exam Gen:- Awake Alert, no acute distress  HEENT:- [place].AT, No sclera icterus Neck-Supple Neck,No JVD,.  Lungs-  CTAB , good air movement bilaterally  CV- S1, S2 normal, regular Abd-  +ve B.Sounds, Abd Soft, No tenderness,    Extremity/Skin:-  resolved edema,   good pulses Psych-affect is appropriate, oriented x3 Neuro-no new focal deficits, no tremors    Data Review   CBC w Diff:  Lab Results  Component Value Date   WBC 4.9 05/18/2019   HGB 7.4 (L) 05/18/2019   HCT 22.7 (L) 05/18/2019   PLT 168 05/18/2019   LYMPHOPCT 20 05/16/2019   MONOPCT 8 05/16/2019   EOSPCT 1 05/16/2019   BASOPCT 0 05/16/2019    CMP:  Lab Results  Component Value Date   NA 141 05/18/2019   K 3.7 05/18/2019   CL 101 05/18/2019   CO2 28 05/18/2019   BUN 51 (H) 05/18/2019   CREATININE 2.77 (H)  05/18/2019   PROT 6.2 (L) 09/19/2016   ALBUMIN 2.6 (L) 05/18/2019   BILITOT 0.7 09/19/2016   ALKPHOS 79 09/19/2016   AST 41 09/19/2016   ALT 40 09/19/2016   Total Discharge time is about 33 minutes  Roxan Hockey M.D on 05/18/2019 at 11:08 AM  Go to [email or web address] -  for contact info  [facility] - Office  [phone]

## 2019-05-18 NOTE — Discharge Instructions (Signed)
1)Very low-salt diet advised 2)Weigh yourself daily, call if you gain more than 3 pounds in 1 day or more than 5 pounds in 1 week as your diuretic medications may need to be adjusted 3)Limit your Fluid  intake to no more than 60 ounces (1.8 Liters) per day 4) increase Lasix to 60 mg (1 and one Half Tablet) twice a day 5) stop metoprolol/Toprol-XL, start Coreg 6.25 mg twice a day instead 6) follow-up with your [facility] doctor for repeat BMP blood test within a week

## 2019-05-20 ENCOUNTER — Other Ambulatory Visit (HOSPITAL_COMMUNITY): Payer: Self-pay | Admitting: Nephrology

## 2019-05-20 DIAGNOSIS — N2581 Secondary hyperparathyroidism of renal origin: Secondary | ICD-10-CM

## 2019-05-20 DIAGNOSIS — N184 Chronic kidney disease, stage 4 (severe): Secondary | ICD-10-CM

## 2019-05-20 DIAGNOSIS — D631 Anemia in chronic kidney disease: Secondary | ICD-10-CM

## 2019-05-20 DIAGNOSIS — E559 Vitamin D deficiency, unspecified: Secondary | ICD-10-CM

## 2019-05-20 DIAGNOSIS — R809 Proteinuria, unspecified: Secondary | ICD-10-CM

## 2019-05-23 ENCOUNTER — Other Ambulatory Visit: Payer: Self-pay

## 2019-05-23 ENCOUNTER — Encounter (HOSPITAL_COMMUNITY): Payer: Self-pay

## 2019-05-23 ENCOUNTER — Encounter (HOSPITAL_COMMUNITY)
Admission: RE | Admit: 2019-05-23 | Discharge: 2019-05-23 | Disposition: A | Discharge status: Home / Self Care | Payer: No Typology Code available for payment source | Source: Ambulatory Visit | Attending: Nephrology | Admitting: Nephrology

## 2019-05-23 DIAGNOSIS — D631 Anemia in chronic kidney disease: Secondary | ICD-10-CM | POA: Insufficient documentation

## 2019-05-23 DIAGNOSIS — N184 Chronic kidney disease, stage 4 (severe): Secondary | ICD-10-CM | POA: Diagnosis not present

## 2019-05-23 LAB — POCT HEMOGLOBIN-HEMACUE: Hemoglobin: 7.9 g/dL — ABNORMAL LOW (ref 13.0–17.0)

## 2019-05-23 MED ORDER — EPOETIN ALFA-EPBX 3000 UNIT/ML IJ SOLN
INTRAMUSCULAR | Status: AC
  Filled 2019-05-23: qty 1

## 2019-05-23 MED ORDER — EPOETIN ALFA 3000 UNIT/ML IJ SOLN
INTRAMUSCULAR | Status: AC
  Filled 2019-05-23: qty 1

## 2019-05-23 MED ORDER — EPOETIN ALFA 3000 UNIT/ML IJ SOLN
3000.0000 [IU] | Freq: Once | INTRAMUSCULAR | Status: AC
  Administered 2019-05-23: 3000 [IU] via SUBCUTANEOUS

## 2019-05-30 ENCOUNTER — Other Ambulatory Visit: Payer: Self-pay | Admitting: Radiology

## 2019-05-31 ENCOUNTER — Other Ambulatory Visit: Payer: Self-pay | Admitting: Physician Assistant

## 2019-06-03 ENCOUNTER — Encounter (HOSPITAL_COMMUNITY): Payer: Self-pay

## 2019-06-03 ENCOUNTER — Ambulatory Visit (HOSPITAL_COMMUNITY)
Admission: RE | Admit: 2019-06-03 | Discharge: 2019-06-03 | Disposition: A | Discharge status: Home / Self Care | Payer: No Typology Code available for payment source | Source: Ambulatory Visit | Attending: Nephrology | Admitting: Nephrology

## 2019-06-03 ENCOUNTER — Other Ambulatory Visit: Payer: Self-pay

## 2019-06-03 DIAGNOSIS — G4730 Sleep apnea, unspecified: Secondary | ICD-10-CM | POA: Diagnosis not present

## 2019-06-03 DIAGNOSIS — N2581 Secondary hyperparathyroidism of renal origin: Secondary | ICD-10-CM | POA: Diagnosis not present

## 2019-06-03 DIAGNOSIS — N184 Chronic kidney disease, stage 4 (severe): Secondary | ICD-10-CM | POA: Diagnosis not present

## 2019-06-03 DIAGNOSIS — Z8551 Personal history of malignant neoplasm of bladder: Secondary | ICD-10-CM | POA: Insufficient documentation

## 2019-06-03 DIAGNOSIS — Z79890 Hormone replacement therapy (postmenopausal): Secondary | ICD-10-CM | POA: Diagnosis not present

## 2019-06-03 DIAGNOSIS — K219 Gastro-esophageal reflux disease without esophagitis: Secondary | ICD-10-CM | POA: Diagnosis not present

## 2019-06-03 DIAGNOSIS — Z79899 Other long term (current) drug therapy: Secondary | ICD-10-CM | POA: Diagnosis not present

## 2019-06-03 DIAGNOSIS — I509 Heart failure, unspecified: Secondary | ICD-10-CM | POA: Diagnosis not present

## 2019-06-03 DIAGNOSIS — E039 Hypothyroidism, unspecified: Secondary | ICD-10-CM | POA: Diagnosis not present

## 2019-06-03 DIAGNOSIS — Z951 Presence of aortocoronary bypass graft: Secondary | ICD-10-CM | POA: Insufficient documentation

## 2019-06-03 DIAGNOSIS — I130 Hypertensive heart and chronic kidney disease with heart failure and stage 1 through stage 4 chronic kidney disease, or unspecified chronic kidney disease: Secondary | ICD-10-CM | POA: Insufficient documentation

## 2019-06-03 DIAGNOSIS — J449 Chronic obstructive pulmonary disease, unspecified: Secondary | ICD-10-CM | POA: Insufficient documentation

## 2019-06-03 DIAGNOSIS — Z8249 Family history of ischemic heart disease and other diseases of the circulatory system: Secondary | ICD-10-CM | POA: Diagnosis not present

## 2019-06-03 DIAGNOSIS — E559 Vitamin D deficiency, unspecified: Secondary | ICD-10-CM | POA: Diagnosis not present

## 2019-06-03 DIAGNOSIS — F419 Anxiety disorder, unspecified: Secondary | ICD-10-CM | POA: Insufficient documentation

## 2019-06-03 DIAGNOSIS — Z888 Allergy status to other drugs, medicaments and biological substances status: Secondary | ICD-10-CM | POA: Diagnosis not present

## 2019-06-03 DIAGNOSIS — E785 Hyperlipidemia, unspecified: Secondary | ICD-10-CM | POA: Diagnosis not present

## 2019-06-03 DIAGNOSIS — D631 Anemia in chronic kidney disease: Secondary | ICD-10-CM | POA: Diagnosis not present

## 2019-06-03 DIAGNOSIS — Z87891 Personal history of nicotine dependence: Secondary | ICD-10-CM | POA: Insufficient documentation

## 2019-06-03 DIAGNOSIS — Z8673 Personal history of transient ischemic attack (TIA), and cerebral infarction without residual deficits: Secondary | ICD-10-CM | POA: Diagnosis not present

## 2019-06-03 DIAGNOSIS — Z7902 Long term (current) use of antithrombotics/antiplatelets: Secondary | ICD-10-CM | POA: Insufficient documentation

## 2019-06-03 DIAGNOSIS — R809 Proteinuria, unspecified: Secondary | ICD-10-CM

## 2019-06-03 DIAGNOSIS — I2510 Atherosclerotic heart disease of native coronary artery without angina pectoris: Secondary | ICD-10-CM | POA: Diagnosis not present

## 2019-06-03 DIAGNOSIS — I252 Old myocardial infarction: Secondary | ICD-10-CM | POA: Diagnosis not present

## 2019-06-03 DIAGNOSIS — Z955 Presence of coronary angioplasty implant and graft: Secondary | ICD-10-CM | POA: Diagnosis not present

## 2019-06-03 DIAGNOSIS — N189 Chronic kidney disease, unspecified: Secondary | ICD-10-CM

## 2019-06-03 LAB — CBC
HCT: 28.4 % — ABNORMAL LOW (ref 39.0–52.0)
Hemoglobin: 9.3 g/dL — ABNORMAL LOW (ref 13.0–17.0)
MCH: 30.1 pg (ref 26.0–34.0)
MCHC: 32.7 g/dL (ref 30.0–36.0)
MCV: 91.9 fL (ref 80.0–100.0)
Platelets: 224 10*3/uL (ref 150–400)
RBC: 3.09 MIL/uL — ABNORMAL LOW (ref 4.22–5.81)
RDW: 13.5 % (ref 11.5–15.5)
WBC: 5.4 10*3/uL (ref 4.0–10.5)
nRBC: 0 % (ref 0.0–0.2)

## 2019-06-03 LAB — PROTIME-INR
INR: 1.1 (ref 0.8–1.2)
Prothrombin Time: 13.8 seconds (ref 11.4–15.2)

## 2019-06-03 MED ORDER — SODIUM CHLORIDE 0.9 % IV SOLN
INTRAVENOUS | Status: AC | PRN
  Administered 2019-06-03: 10 mL/h via INTRAVENOUS

## 2019-06-03 MED ORDER — FENTANYL CITRATE (PF) 100 MCG/2ML IJ SOLN
INTRAMUSCULAR | Status: AC
  Filled 2019-06-03: qty 2

## 2019-06-03 MED ORDER — LIDOCAINE HCL (PF) 1 % IJ SOLN
INTRAMUSCULAR | Status: AC
  Filled 2019-06-03: qty 30

## 2019-06-03 MED ORDER — MIDAZOLAM HCL 2 MG/2ML IJ SOLN
INTRAMUSCULAR | Status: AC | PRN
  Administered 2019-06-03: 1 mg via INTRAVENOUS
  Administered 2019-06-03 (×2): 0.5 mg via INTRAVENOUS

## 2019-06-03 MED ORDER — MIDAZOLAM HCL 2 MG/2ML IJ SOLN
INTRAMUSCULAR | Status: AC
  Filled 2019-06-03: qty 2

## 2019-06-03 MED ORDER — FENTANYL CITRATE (PF) 100 MCG/2ML IJ SOLN
INTRAMUSCULAR | Status: AC | PRN
  Administered 2019-06-03 (×4): 25 ug via INTRAVENOUS

## 2019-06-03 MED ORDER — HYDROCODONE-ACETAMINOPHEN 5-325 MG PO TABS: 1.0000 | ORAL_TABLET | ORAL | Status: DC | PRN

## 2019-06-03 MED ORDER — SODIUM CHLORIDE 0.9 % IV SOLN: INTRAVENOUS | Status: DC

## 2019-06-03 NOTE — H&P (Signed)
Chief Complaint: Patient was seen in consultation today for a random renal biopsy.  Referring Physician(s): [place] S  Supervising Physician: Markus Daft  Patient Status: [name] - Out-pt  History of Present Illness: [name] is a 74 y.o. male with a past medical history significant for anxiety, hypothyroidism, HTN, HLD, CVA, previous MI, CAD s/p CABG, CHF, GERD, COPD, bladder cancer and CKD followed by Dr. Theador Hawthorne who presents today for a random renal biopsy. [name] has had a history of known CKD since 2011 which was relatively stable until he recently when he was admitted to the hospital for a CHF exacerbation on 05/18/19 where he was noted to have a creatinine of 2.66 (previously known baseline 1.6) on admission which worsened to 2.77 after IV diuresis. He was seen by nephrology as an outpatient on 05/23/19 and after a thorough discussion about the indications and risks of a random renal biopsy plan was made to proceed with biopsy in IR for which he presents today.  [name] denies any complaints today except that he is cold and would like another blanket. He has been feeling well since his discharge from the hospital. He states understanding of the requested procedure and agrees to proceed as planned.   Past Medical History:  Diagnosis Date   Acute myocardial infarction of other inferior wall, initial episode of care    Anginal pain ([place])    Anxiety    Arthritis    Cancer ([place])    bladder 2013 removed, has cystoscopy 10/2016, has returned x 2   COPD (chronic obstructive pulmonary disease) ([place])    Coronary artery disease    Artery bypass graft Jan 2008   Dyspnea    Dysrhythmia    GERD (gastroesophageal reflux disease)    Heart murmur    History of hiatal hernia    Hyperlipidemia    Hypertension    Hypothyroidism    PONV (postoperative nausea and vomiting)    Sleep apnea    Stroke ([place])    Tobacco user     Past Surgical History:   Procedure Laterality Date   APPENDECTOMY     CARDIAC CATHETERIZATION     CORONARY ARTERY BYPASS GRAFT  06/14/2006   x 5   CORONARY ARTERY BYPASS GRAFT N/A 09/19/2016   Procedure: REDO CORONARY ARTERY BYPASS GRAFTING (CABG) x two, using right internal mammary artery and left radial artery;  Surgeon: [name], MD;  Location: [facility];  Service: Open Heart Surgery;  Laterality: N/A;   FOOT SURGERY     HERNIA REPAIR     LEFT HEART CATH AND CORS/GRAFTS ANGIOGRAPHY N/A 09/14/2016   Procedure: Left Heart Cath and Cors/Grafts Angiography;  Surgeon: Troy Sine, MD;  Location: [facility];  Service: Cardiovascular;  Laterality: N/A;   LEFT HEART CATHETERIZATION WITH CORONARY/GRAFT ANGIOGRAM N/A 01/31/2014   Procedure: LEFT HEART CATHETERIZATION WITH Beatrix Fetters;  Surgeon: Sanda Klein, MD;  Location: [facility];  Service: Cardiovascular;  Laterality: N/A;   NOSE SURGERY     PERCUTANEOUS CORONARY STENT INTERVENTION (PCI-S)  01/31/2014   Procedure: PERCUTANEOUS CORONARY STENT INTERVENTION (PCI-S);  Surgeon: Sanda Klein, MD;  Location[facility];  Service: Cardiovascular;;   RADIAL ARTERY HARVEST Left 09/19/2016   Procedure: RADIAL ARTERY HARVEST;  Surgeon: [name], MD;  Location: [facility];  Service: Open Heart Surgery;  Laterality: Left;   TEE WITHOUT CARDIOVERSION N/A 09/19/2016   Procedure: TRANSESOPHAGEAL ECHOCARDIOGRAM (TEE);  Surgeon: [name], MD;  Location: [facility];  Service: Open Heart Surgery;  Laterality: N/A;    Allergies: Cardizem cd [diltiazem hcl er beads]  Medications: Prior to Admission medications   Medication Sig Start Date End Date Taking? Authorizing Provider  allopurinol (ZYLOPRIM) 100 MG tablet Take 100 mg by mouth 2 (two) times daily. 05/03/19  Yes [provider]  amLODipine (NORVASC) 10 MG tablet Take 1 tablet (10 mg total) by mouth daily. 05/18/19  Yes Emokpae, Courage, MD  atorvastatin (LIPITOR) 80 MG tablet Take 80  mg by mouth daily.   Yes [provider]  carvedilol (COREG) 6.25 MG tablet Take 1 tablet (6.25 mg total) by mouth 2 (two) times daily. 05/18/19 05/17/20 Yes Emokpae, Courage, MD  cyanocobalamin (,VITAMIN B-12,) 1000 MCG/ML injection Inject into the muscle.   Yes [provider]  epoetin alfa (EPOGEN) 3000 UNIT/ML injection Inject 3,000 Units into the vein every 14 (fourteen) days.   Yes [provider]  ferrous sulfate 325 (65 FE) MG tablet Take 325 mg by mouth 3 (three) times a week. MWF   Yes [provider]  finasteride (PROSCAR) 5 MG tablet Take 5 mg by mouth daily.   Yes [provider]  folic acid (FOLVITE) 1 MG tablet Take by mouth.   Yes [provider]  furosemide (LASIX) 40 MG tablet Take 1.5 tablets (60 mg total) by mouth 2 (two) times daily. 05/18/19  Yes Emokpae, Courage, MD  hydrALAZINE (APRESOLINE) 100 MG tablet Take 1 tablet (100 mg total) by mouth 3 (three) times daily. 05/18/19  Yes Roxan Hockey, MD  isosorbide mononitrate (IMDUR) 120 MG 24 hr tablet Take 1 tablet (120 mg total) by mouth daily. 05/18/19  Yes Emokpae, Courage, MD  levothyroxine (SYNTHROID, LEVOTHROID) 50 MCG tablet Take 50 mcg by mouth daily before breakfast.   Yes [provider]  losartan (COZAAR) 25 MG tablet Take 1 tablet (25 mg total) by mouth daily. Start on Monday 05/19/2018 05/18/19  Yes Emokpae, Courage, MD  magnesium oxide (MAG-OX) 400 MG tablet Take 420 mg by mouth daily.   Yes [provider]  Omega-3 Fatty Acids (RA FISH OIL) 1000 MG CAPS Take 2,000 mg by mouth 2 (two) times daily.    Yes [provider]  omeprazole (PRILOSEC) 20 MG capsule Take 40 mg by mouth 2 (two) times daily.    Yes [provider]  tamsulosin (FLOMAX) 0.4 MG CAPS capsule Take 0.4 mg by mouth daily.   Yes [provider]  Vitamin D, Cholecalciferol, 25 MCG (1000 UT) CAPS Take 1 capsule by mouth daily.    Yes [provider]   acetaminophen (TYLENOL) 325 MG tablet Take 2 tablets (650 mg total) by mouth every 6 (six) hours as needed for mild pain. 09/23/16   Nani Skillern, PA-C  clopidogrel (PLAVIX) 75 MG tablet Take 75 mg by mouth daily.    [provider]  nitroGLYCERIN (NITROSTAT) 0.4 MG SL tablet Place 0.4 mg under the tongue every 5 (five) minutes as needed for chest pain.    [provider]     Family History  Problem Relation Age of Onset   Heart attack Mother    Heart attack Brother     Social History   Socioeconomic History   Marital status: Married    Spouse name: Not on file   Number of children: Not on file   Years of education: Not on file   Highest education level: Not on file  Occupational History  Not on file  Tobacco Use   Smoking status: Former Smoker    Packs/day: 1.00    Years: 40.00    Pack years: 40.00    Types: Cigarettes    Start date: 02/20/1959    Quit date: 09/13/2016    Years since quitting: 2.7   Smokeless tobacco: Never Used   Tobacco comment: 10/7 2-8 cigarettes per day. cutting back- AJ  Substance and Sexual Activity   Alcohol use: No    Alcohol/week: 0.0 standard drinks    Comment: "No, not really"   Drug use: No   Sexual activity: Not Currently  Other Topics Concern   Not on file  Social History Narrative   Not on file   Social Determinants of Health   Financial Resource Strain:    Difficulty of Paying Living Expenses: Not on file  Food Insecurity:    Worried About Reserve in the Last Year: Not on file   Ran Out of Food in the Last Year: Not on file  Transportation Needs:    Lack of Transportation (Medical): Not on file   Lack of Transportation (Non-Medical): Not on file  Physical Activity:    Days of Exercise per Week: Not on file   Minutes of Exercise per Session: Not on file  Stress:    Feeling of Stress : Not on file  Social Connections:    Frequency of Communication with Friends and  Family: Not on file   Frequency of Social Gatherings with Friends and Family: Not on file   Attends Religious Services: Not on file   Active Member of Clubs or Organizations: Not on file   Attends Archivist Meetings: Not on file   Marital Status: Not on file     Review of Systems: A 12 point ROS discussed and pertinent positives are indicated in the HPI above.  All other systems are negative.  Review of Systems  Constitutional: Negative for chills and fever.  HENT: Negative for nosebleeds.   Respiratory: Negative for cough and shortness of breath.   Cardiovascular: Negative for chest pain.  Gastrointestinal: Negative for abdominal pain, blood in stool, diarrhea, nausea and vomiting.  Genitourinary: Negative for hematuria.  Musculoskeletal: Negative for back pain.  Skin: Negative for rash and wound.  Neurological: Negative for dizziness and headaches.    Vital Signs: BP (!) 158/63    Pulse 63    Temp 97.7 F (36.5 C) (Oral)    Resp 16    Ht 5\' 9"  (1.753 m)    Wt 220 lb (99.8 kg)    SpO2 98%    BMI 32.49 kg/m   Physical Exam Vitals reviewed.  Constitutional:      General: He is not in acute distress. HENT:     Head: Normocephalic.     Mouth/Throat:     Mouth: Mucous membranes are moist.     Pharynx: Oropharynx is clear. No oropharyngeal exudate or posterior oropharyngeal erythema.     Comments: (+) upper and lower full dentures Cardiovascular:     Rate and Rhythm: Normal rate and regular rhythm.  Pulmonary:     Effort: Pulmonary effort is normal.     Breath sounds: Normal breath sounds.  Abdominal:     General: Bowel sounds are normal. There is no distension.     Palpations: Abdomen is soft.     Tenderness: There is no abdominal tenderness.  Skin:    General: Skin is warm and dry.  Neurological:  Mental Status: He is alert and oriented to person, place, and time.  Psychiatric:        Mood and Affect: Mood normal.        Behavior: Behavior normal.         Thought Content: Thought content normal.        Judgment: Judgment normal.      MD Evaluation Airway: WNL((+) upper and lower full dentures) Heart: WNL Abdomen: WNL Chest/ Lungs: WNL ASA  Classification: 2 Mallampati/Airway Score: Two   Imaging: CT Head Wo Contrast  Result Date: 05/16/2019 CLINICAL DATA:  Neuro deficit, acute, stroke suspected. Additional history provided: Sudden onset right arm weakness since 6 a.m., history of stroke. EXAM: CT HEAD WITHOUT CONTRAST TECHNIQUE: Contiguous axial images were obtained from the base of the skull through the vertex without intravenous contrast. COMPARISON:  Head CT 01/14/2019, brain MRI 12/20/2016 FINDINGS: Brain: No evidence of acute intracranial hemorrhage. No acute demarcated cortical infarction. No evidence of intracranial mass. No midline shift or extra-axial fluid collection. Mild ill-defined hypoattenuation within cerebral white matter is nonspecific, but consistent with chronic small vessel ischemic disease. Redemonstrated chronic lacunar infarcts within the right cerebellum. Multiple small bilateral remote cortical infarcts were better appreciated on prior MRI 12/20/2016. Mild generalized parenchymal atrophy. Vascular: No hyperdense vessel.  Atherosclerotic calcifications. Skull: Normal. Negative for fracture or focal lesion. Sinuses/Orbits: Visualized orbits demonstrate no acute abnormality. Minimal ethmoid sinus mucosal thickening. Trace fluid within bilateral mastoid air cells. IMPRESSION: No CT evidence of acute intracranial abnormality. Generalized parenchymal atrophy and chronic small vessel ischemic disease. Redemonstrated remote lacunar infarcts within the right cerebellum. Multiple small remote bilateral cortical infarcts were better appreciated on prior brain MRI 12/20/2016. Electronically Signed   By: Kellie Simmering DO   On: 05/16/2019 11:31   NM Pulmonary Perfusion  Result Date: 05/16/2019 CLINICAL DATA:  Dyspnea on  exertion.  Shortness of breath. EXAM: NUCLEAR MEDICINE PERFUSION LUNG SCAN TECHNIQUE: Perfusion images were obtained in multiple projections after intravenous injection of radiopharmaceutical. Ventilation scans intentionally deferred if perfusion scan and chest x-ray adequate for interpretation during COVID 19 epidemic. RADIOPHARMACEUTICALS:  1.5 mCi Tc-73m MAA IV COMPARISON:  05/16/2019 chest radiograph FINDINGS: No wedge-shaped perfusion defects to suggest acute pulmonary embolism. IMPRESSION: No evidence of pulmonary embolism. Electronically Signed   By: Abigail Miyamoto M.D.   On: 05/16/2019 17:42   DG Chest Port 1 View  Result Date: 05/16/2019 CLINICAL DATA:  Shortness of breath EXAM: PORTABLE CHEST 1 VIEW COMPARISON:  10/26/2016 FINDINGS: Increased interstitial markings/faint patchy opacities in the lungs bilaterally, upper lobe predominant. This appearance suggests mild infection. No pleural effusion or pneumothorax. Cardiomegaly. Postsurgical changes related to prior CABG. Thoracic aortic atherosclerosis. Median sternotomy. IMPRESSION: Increased interstitial markings/faint patchy opacities in upper lungs bilaterally, raising concern for mild infection. Electronically Signed   By: Julian Hy M.D.   On: 05/16/2019 10:28   ECHOCARDIOGRAM COMPLETE  Result Date: 05/17/2019   ECHOCARDIOGRAM REPORT   Patient Name:   KENDRY PFARR Date of Exam: 05/17/2019 Medical Rec #:  [id number]      Height:       69.0 in Accession #:    [id number]     Weight:       230.6 lb Date of Birth:  1946/03/21      BSA:          2.19 m Patient Age:    69 years       BP:  167/58 mmHg Patient Gender: M              HR:           65 bpm. Exam Location:  [facility] Procedure: 2D Echo Indications:    Dyspnea 786.09 / R06.00  History:        Patient has prior history of Echocardiogram examinations, most                 recent 12/29/2016. Previous Myocardial Infarction, COPD and                 Stroke, Signs/Symptoms:Chest Pain  and Murmur; Risk                 Factors:Hypertension, Dyslipidemia and Former Smoker.  Sonographer:    Leavy Cella RDCS (AE) Referring Phys: [id number] COURAGE EMOKPAE IMPRESSIONS  1. Left ventricular ejection fraction, by visual estimation, is 60 to 65%. The left ventricle has normal function. There is moderately increased left ventricular hypertrophy.  2. Left ventricular diastolic parameters are consistent with Grade I diastolic dysfunction (impaired relaxation).  3. The left ventricle has no regional wall motion abnormalities.  4. Global right ventricle has normal systolic function.The right ventricular size is normal. No increase in right ventricular wall thickness.  5. Left atrial size was severely dilated.  6. Right atrial size was normal.  7. The mitral valve is grossly normal. Trivial mitral valve regurgitation.  8. The tricuspid valve is grossly normal.  9. The aortic valve is tricuspid. Aortic valve regurgitation is mild. Mild to moderate aortic valve stenosis. 10. Aortic valve area, by VTI measures 1.40 cm. 11. Aortic valve mean gradient measures 17.3 mmHg. 12. The pulmonic valve was grossly normal. Pulmonic valve regurgitation is trivial. 13. Moderately elevated pulmonary artery systolic pressure. 14. The tricuspid regurgitant velocity is 3.37 m/s, and with an assumed right atrial pressure of 8 mmHg, the estimated right ventricular systolic pressure is moderately elevated at 53.4 mmHg. 15. The inferior vena cava is dilated in size with >50% respiratory variability, suggesting right atrial pressure of 8 mmHg. 16. Evidence of atrial level shunting detected by color flow Doppler. FINDINGS  Left Ventricle: Left ventricular ejection fraction, by visual estimation, is 60 to 65%. The left ventricle has normal function. The left ventricle has no regional wall motion abnormalities. There is moderately increased left ventricular hypertrophy. Left ventricular diastolic parameters are consistent with Grade I  diastolic dysfunction (impaired relaxation). Right Ventricle: The right ventricular size is normal. No increase in right ventricular wall thickness. Global RV systolic function is has normal systolic function. The tricuspid regurgitant velocity is 3.37 m/s, and with an assumed right atrial pressure  of 8 mmHg, the estimated right ventricular systolic pressure is moderately elevated at 53.4 mmHg. Left Atrium: Left atrial size was severely dilated. Right Atrium: Right atrial size was normal in size Pericardium: There is no evidence of pericardial effusion. Mitral Valve: The mitral valve is grossly normal. Trivial mitral valve regurgitation. Tricuspid Valve: The tricuspid valve is grossly normal. Tricuspid valve regurgitation is trivial. Aortic Valve: The aortic valve is tricuspid. Aortic valve regurgitation is mild. Aortic regurgitation PHT measures 353 msec. Mild to moderate aortic stenosis is present. Moderate aortic valve annular calcification. Aortic valve mean gradient measures 17.3 mmHg. Aortic valve peak gradient measures 29.7 mmHg. Aortic valve area, by VTI measures 1.40 cm. Pulmonic Valve: The pulmonic valve was grossly normal. Pulmonic valve regurgitation is trivial. Pulmonic regurgitation is trivial. Aorta: The aortic root is normal in  size and structure. Venous: The inferior vena cava is dilated in size with greater than 50% respiratory variability, suggesting right atrial pressure of 8 mmHg. IAS/Shunts: Evidence of atrial level shunting detected by color flow Doppler.  LEFT VENTRICLE PLAX 2D LVIDd:         4.82 cm  Diastology LVIDs:         2.98 cm  LV e' lateral:   7.62 cm/s LV PW:         1.74 cm  LV E/e' lateral: 15.0 LV IVS:        1.49 cm  LV e' medial:    5.22 cm/s LVOT diam:     2.20 cm  LV E/e' medial:  21.8 LV SV:         74 ml LV SV Index:   32.32 LVOT Area:     3.80 cm  RIGHT VENTRICLE RV S prime:     14.50 cm/s TAPSE (M-mode): 2.0 cm LEFT ATRIUM              Index       RIGHT ATRIUM            Index LA diam:        5.00 cm  2.28 cm/m  RA Area:     13.70 cm LA Vol (A2C):   72.7 ml  33.12 ml/m RA Volume:   34.10 ml  15.54 ml/m LA Vol (A4C):   130.0 ml 59.23 ml/m LA Biplane Vol: 103.0 ml 46.93 ml/m  AORTIC VALVE AV Area (Vmax):    1.43 cm AV Area (Vmean):   1.43 cm AV Area (VTI):     1.40 cm AV Vmax:           272.33 cm/s AV Vmean:          195.333 cm/s AV VTI:            0.743 m AV Peak Grad:      29.7 mmHg AV Mean Grad:      17.3 mmHg LVOT Vmax:         102.67 cm/s LVOT Vmean:        73.533 cm/s LVOT VTI:          0.275 m LVOT/AV VTI ratio: 0.37 AI PHT:            353 msec  AORTA Ao Root diam: 3.60 cm MITRAL VALVE                         TRICUSPID VALVE MV Area (PHT): 2.01 cm              TR Peak grad:   45.4 mmHg MV PHT:        109.33 msec           TR Vmax:        337.00 cm/s MV Decel Time: 377 msec MV E velocity: 114.00 cm/s 103 cm/s  SHUNTS MV A velocity: 113.00 cm/s 70.3 cm/s Systemic VTI:  0.27 m MV E/A ratio:  1.01        1.5       Systemic Diam: 2.20 cm  Rozann Lesches MD Electronically signed by Rozann Lesches MD Signature Date/Time: 05/17/2019/10:41:55 AM    Final     Labs:  CBC: Recent Labs    05/16/19 1009 05/16/19 1009 05/17/19 0601 05/18/19 0514 05/23/19 1043 06/03/19 0610  WBC 7.1  --  4.8 4.9  --  5.4  HGB 8.9*   < > 7.6* 7.4* 7.9* 9.3*  HCT 27.3*  --  23.0* 22.7*  --  28.4*  PLT 178  --  153 168  --  224   < > = values in this interval not displayed.    COAGS: No results for input(s): INR, APTT in the last 8760 hours.  BMP: Recent Labs    05/16/19 1009 05/17/19 0601 05/18/19 0514  NA 137 137 141  K 3.5 3.2* 3.7  CL 98 99 101  CO2 25 26 28   GLUCOSE 104* 95 95  BUN 54* 53* 51*  CALCIUM 8.6* 8.1* 8.4*  CREATININE 2.66* 2.74* 2.77*  GFRNONAA 23* 22* 22*  GFRAA 27* 26* 25*    LIVER FUNCTION TESTS: Recent Labs    05/18/19 0514  ALBUMIN 2.6*    TUMOR MARKERS: No results for input(s): AFPTM, CEA, CA199, CHROMGRNA in the last 8760  hours.  Assessment and Plan:  74 y/o M with recent worsening of CKD/proteinuria followed by Dr. Theador Hawthorne who presents today for a random renal biopsy.  Patient has been NPO since 8 pm last night except for a few sips of water this morning with his medications, last dose of Plavix 05/28/19. Afebrile, WBC 5.4, hgb 9.3, plt 224, INR 1.1.  Risks and benefits of random renal biopsy was discussed with the patient and/or patient's family including, but not limited to bleeding, infection, damage to adjacent structures or low yield requiring additional tests.  All of the questions were answered and there is agreement to proceed.  Consent signed and in chart.   Thank you for this interesting consult.  I greatly enjoyed meeting MANSEL STROTHER and look forward to participating in their care.  A copy of this report was sent to the requesting provider on this date.  Electronically Signed: Joaquim Nam, PA-C 06/03/2019, 7:30 AM   I spent a total of 30 Minutes  in face to face in clinical consultation, greater than 50% of which was counseling/coordinating care for a random renal biopsy.

## 2019-06-03 NOTE — Discharge Instructions (Signed)
Percutaneous Kidney Biopsy, Care After This sheet gives you information about how to care for yourself after your procedure. Your health care provider may also give you more specific instructions. If you have problems or questions, contact your health care provider. What can I expect after the procedure? After the procedure, it is common to have:  Pain or soreness near the biopsy site.  Pink or cloudy urine for 24 hours after the procedure. Follow these instructions at home: Activity  Return to your normal activities as told by your health care provider. Ask your health care provider what activities are safe for you.  If you were given a sedative during the procedure, it can affect you for several hours. Do not drive or operate machinery until your health care provider says that it is safe.  Do not lift anything that is heavier than 10 lb (4.5 kg), or the limit that you are told, until your health care provider says that it is safe. About 2 weeks  Avoid activities that take a lot of effort (are strenuous) until your health care provider approves. Most people will have to wait 2 weeks before returning to activities such as exercise or sex. General instructions   Take over-the-counter and prescription medicines only as told by your health care provider.  You may eat and drink after your procedure. Follow instructions from your health care provider about eating or drinking restrictions.  Check your biopsy site every day for signs of infection. Check for: ? More redness, swelling, or pain. ? Fluid or blood. ? Warmth. ? Pus or a bad smell.  Keep all follow-up visits as told by your health care provider. This is important.  You may remove your bandaids in 24 hours. Then you may shower. Contact a health care provider if:  You have more redness, swelling, or pain around your biopsy site.  You have fluid or blood coming from your biopsy site.  Your biopsy site feels warm to the  touch.  You have pus or a bad smell coming from your biopsy site.  You have blood in your urine more than 24 hours after your procedure.  You have a fever. Get help right away if:  Your urine is dark red or brown.  You cannot urinate.  It burns when you urinate.  You feel dizzy or light-headed.  You have severe pain in your abdomen or side. Summary  After the procedure, it is common to have pain or soreness at the biopsy site and pink or cloudy urine for the first 24 hours.  Check your biopsy site each day for signs of infection, such as more redness, swelling, or pain; fluid, blood, pus or a bad smell coming from the biopsy site; or the biopsy site feeling warm to touch.  Return to your normal activities as told by your health care provider. This information is not intended to replace advice given to you by your health care provider. Make sure you discuss any questions you have with your health care provider. Document Revised: 01/03/2019 Document Reviewed: 01/03/2019 Elsevier Patient Education  [place].  Moderate Conscious Sedation, Adult, Care After These instructions provide you with information about caring for yourself after your procedure. Your health care provider may also give you more specific instructions. Your treatment has been planned according to current medical practices, but problems sometimes occur. Call your health care provider if you have any problems or questions after your procedure. What can I expect after the procedure? After  your procedure, it is common:  To feel sleepy for several hours.  To feel clumsy and have poor balance for several hours.  To have poor judgment for several hours.  To vomit if you eat too soon. Follow these instructions at home: For at least 24 hours after the procedure:   Do not: ? Participate in activities where you could fall or become injured. ? Drive. ? Use heavy machinery. ? Drink alcohol. ? Take  sleeping pills or medicines that cause drowsiness. ? Make important decisions or sign legal documents. ? Take care of children on your own.  Rest. Eating and drinking  Follow the diet recommended by your health care provider.  If you vomit: ? Drink water, juice, or soup when you can drink without vomiting. ? Make sure you have little or no nausea before eating solid foods. General instructions  Have a responsible adult stay with you until you are awake and alert.  Take over-the-counter and prescription medicines only as told by your health care provider.  If you smoke, do not smoke without supervision.  Keep all follow-up visits as told by your health care provider. This is important. Contact a health care provider if:  You keep feeling nauseous or you keep vomiting.  You feel light-headed.  You develop a rash.  You have a fever. Get help right away if:  You have trouble breathing. This information is not intended to replace advice given to you by your health care provider. Make sure you discuss any questions you have with your health care provider. Document Revised: 04/14/2017 Document Reviewed: 08/22/2015 Elsevier Patient Education  2020 Elsevier Inc.  

## 2019-06-03 NOTE — Procedures (Signed)
Interventional Radiology Procedure:   Indications: Proteinuria and CKD stage IV  Procedure: US guided left renal biopsy  Findings: 2 cores from left kidney lower pole.  Adequate specimens obtained.  Complications: None     EBL: less than 10 ml  Plan: Bedrest 4 hours.    Florentina Marquart R. Anselm Pancoast, MD  Pager: [phone]

## 2019-06-06 ENCOUNTER — Encounter (HOSPITAL_COMMUNITY)
Admission: RE | Admit: 2019-06-06 | Discharge: 2019-06-06 | Disposition: A | Discharge status: Home / Self Care | Payer: No Typology Code available for payment source | Source: Ambulatory Visit | Attending: Nephrology | Admitting: Nephrology

## 2019-06-06 ENCOUNTER — Other Ambulatory Visit: Payer: Self-pay

## 2019-06-06 ENCOUNTER — Encounter (HOSPITAL_COMMUNITY): Payer: Self-pay

## 2019-06-06 DIAGNOSIS — N184 Chronic kidney disease, stage 4 (severe): Secondary | ICD-10-CM | POA: Diagnosis not present

## 2019-06-06 LAB — POCT HEMOGLOBIN-HEMACUE: Hemoglobin: 7.8 g/dL — ABNORMAL LOW (ref 13.0–17.0)

## 2019-06-06 MED ORDER — EPOETIN ALFA 3000 UNIT/ML IJ SOLN
3000.0000 [IU] | Freq: Once | INTRAMUSCULAR | Status: AC
  Administered 2019-06-06: 3000 [IU] via SUBCUTANEOUS
  Filled 2019-06-06: qty 1

## 2019-06-12 LAB — SURGICAL PATHOLOGY

## 2019-06-18 ENCOUNTER — Other Ambulatory Visit: Payer: Self-pay

## 2019-06-18 NOTE — Patient Outreach (Signed)
[facility]) Care Management  06/18/2019  [name] Jun 04, 1945 [id number]   Medication Adherence call to [name] [facility] Ident[facility] Verify spoke with patient he is past due on Losartan 25 mg,patient explain he takes 1 tablet daily and has enough for 3 more weeks. [name] is showing past due under [place].  [facility] Direct Dial [phone]  Fax [phone] [email or web address]

## 2019-06-20 ENCOUNTER — Encounter (HOSPITAL_COMMUNITY): Payer: Self-pay

## 2019-06-20 ENCOUNTER — Encounter (HOSPITAL_COMMUNITY)
Admission: RE | Admit: 2019-06-20 | Discharge: 2019-06-20 | Disposition: A | Discharge status: Home / Self Care | Payer: No Typology Code available for payment source | Source: Ambulatory Visit | Attending: Nephrology | Admitting: Nephrology

## 2019-06-20 ENCOUNTER — Other Ambulatory Visit: Payer: Self-pay

## 2019-06-20 DIAGNOSIS — D631 Anemia in chronic kidney disease: Secondary | ICD-10-CM | POA: Diagnosis not present

## 2019-06-20 DIAGNOSIS — N184 Chronic kidney disease, stage 4 (severe): Secondary | ICD-10-CM | POA: Diagnosis present

## 2019-06-20 LAB — POCT HEMOGLOBIN-HEMACUE: Hemoglobin: 8.3 g/dL — ABNORMAL LOW (ref 13.0–17.0)

## 2019-06-20 MED ORDER — EPOETIN ALFA 3000 UNIT/ML IJ SOLN
6000.0000 [IU] | Freq: Once | INTRAMUSCULAR | Status: AC
  Administered 2019-06-20: 6000 [IU] via SUBCUTANEOUS
  Filled 2019-06-20: qty 2

## 2019-06-25 ENCOUNTER — Encounter (HOSPITAL_COMMUNITY): Payer: Self-pay

## 2019-07-04 ENCOUNTER — Encounter (HOSPITAL_COMMUNITY): Payer: No Typology Code available for payment source

## 2019-07-04 ENCOUNTER — Encounter (HOSPITAL_COMMUNITY): Admission: RE | Admit: 2019-07-04 | Payer: No Typology Code available for payment source | Source: Ambulatory Visit

## 2019-07-10 ENCOUNTER — Encounter (HOSPITAL_COMMUNITY)
Admission: RE | Admit: 2019-07-10 | Discharge: 2019-07-10 | Disposition: A | Discharge status: Home / Self Care | Payer: No Typology Code available for payment source | Source: Ambulatory Visit | Attending: Nephrology | Admitting: Nephrology

## 2019-07-10 ENCOUNTER — Other Ambulatory Visit: Payer: Self-pay

## 2019-07-10 ENCOUNTER — Encounter (HOSPITAL_COMMUNITY): Payer: Self-pay

## 2019-07-10 DIAGNOSIS — N184 Chronic kidney disease, stage 4 (severe): Secondary | ICD-10-CM | POA: Diagnosis not present

## 2019-07-10 LAB — CBC WITH DIFFERENTIAL/PLATELET
Abs Immature Granulocytes: 0.01 10*3/uL (ref 0.00–0.07)
Basophils Absolute: 0 10*3/uL (ref 0.0–0.1)
Basophils Relative: 0 %
Eosinophils Absolute: 0.1 10*3/uL (ref 0.0–0.5)
Eosinophils Relative: 2 %
HCT: 24.9 % — ABNORMAL LOW (ref 39.0–52.0)
Hemoglobin: 7.9 g/dL — ABNORMAL LOW (ref 13.0–17.0)
Immature Granulocytes: 0 %
Lymphocytes Relative: 21 %
Lymphs Abs: 1.2 10*3/uL (ref 0.7–4.0)
MCH: 29.4 pg (ref 26.0–34.0)
MCHC: 31.7 g/dL (ref 30.0–36.0)
MCV: 92.6 fL (ref 80.0–100.0)
Monocytes Absolute: 0.4 10*3/uL (ref 0.1–1.0)
Monocytes Relative: 7 %
Neutro Abs: 4 10*3/uL (ref 1.7–7.7)
Neutrophils Relative %: 70 %
Platelets: 142 10*3/uL — ABNORMAL LOW (ref 150–400)
RBC: 2.69 MIL/uL — ABNORMAL LOW (ref 4.22–5.81)
RDW: 14.4 % (ref 11.5–15.5)
WBC: 5.8 10*3/uL (ref 4.0–10.5)
nRBC: 0 % (ref 0.0–0.2)

## 2019-07-10 LAB — RENAL FUNCTION PANEL
Albumin: 3.1 g/dL — ABNORMAL LOW (ref 3.5–5.0)
Anion gap: 10 (ref 5–15)
BUN: 59 mg/dL — ABNORMAL HIGH (ref 8–23)
CO2: 27 mmol/L (ref 22–32)
Calcium: 8.3 mg/dL — ABNORMAL LOW (ref 8.9–10.3)
Chloride: 105 mmol/L (ref 98–111)
Creatinine, Ser: 2.88 mg/dL — ABNORMAL HIGH (ref 0.61–1.24)
GFR calc Af Amer: 24 mL/min — ABNORMAL LOW (ref >60)
GFR calc non Af Amer: 21 mL/min — ABNORMAL LOW (ref >60)
Glucose, Bld: 114 mg/dL — ABNORMAL HIGH (ref 70–99)
Phosphorus: 3.6 mg/dL (ref 2.5–4.6)
Potassium: 3.3 mmol/L — ABNORMAL LOW (ref 3.5–5.1)
Sodium: 142 mmol/L (ref 135–145)

## 2019-07-10 LAB — POCT HEMOGLOBIN-HEMACUE: Hemoglobin: 8.2 g/dL — ABNORMAL LOW (ref 13.0–17.0)

## 2019-07-10 LAB — PROTEIN / CREATININE RATIO, URINE
Creatinine, Urine: 87.47 mg/dL
Protein Creatinine Ratio: 5.76 mg/mg{Cre} — ABNORMAL HIGH (ref 0.00–0.15)
Total Protein, Urine: 504 mg/dL

## 2019-07-10 LAB — VITAMIN D 25 HYDROXY (VIT D DEFICIENCY, FRACTURES): Vit D, 25-Hydroxy: 35.24 ng/mL (ref 30–100)

## 2019-07-10 LAB — FERRITIN: Ferritin: 313 ng/mL (ref 24–336)

## 2019-07-10 MED ORDER — EPOETIN ALFA 3000 UNIT/ML IJ SOLN
6000.0000 [IU] | Freq: Once | INTRAMUSCULAR | Status: AC
  Administered 2019-07-10: 10:00:00 6000 [IU] via SUBCUTANEOUS
  Filled 2019-07-10: qty 2

## 2019-07-11 LAB — PARATHYROID HORMONE, INTACT (NO CA): PTH: 43 pg/mL (ref 15–65)

## 2019-07-24 ENCOUNTER — Other Ambulatory Visit: Payer: Self-pay

## 2019-07-24 ENCOUNTER — Encounter (HOSPITAL_COMMUNITY)
Admission: RE | Admit: 2019-07-24 | Discharge: 2019-07-24 | Disposition: A | Discharge status: Home / Self Care | Payer: No Typology Code available for payment source | Source: Ambulatory Visit | Attending: Nephrology | Admitting: Nephrology

## 2019-07-24 ENCOUNTER — Encounter (HOSPITAL_COMMUNITY): Payer: Self-pay

## 2019-07-24 DIAGNOSIS — N184 Chronic kidney disease, stage 4 (severe): Secondary | ICD-10-CM | POA: Diagnosis present

## 2019-07-24 DIAGNOSIS — D631 Anemia in chronic kidney disease: Secondary | ICD-10-CM | POA: Diagnosis not present

## 2019-07-24 LAB — POCT HEMOGLOBIN-HEMACUE: Hemoglobin: 7.5 g/dL — ABNORMAL LOW (ref 13.0–17.0)

## 2019-07-24 MED ORDER — EPOETIN ALFA 2000 UNIT/ML IJ SOLN
6000.0000 [IU] | Freq: Once | INTRAMUSCULAR | Status: AC
  Administered 2019-07-24: 15:00:00 6000 [IU] via SUBCUTANEOUS
  Filled 2019-07-24: qty 3

## 2019-07-24 MED ORDER — EPOETIN ALFA 3000 UNIT/ML IJ SOLN: 3000.0000 [IU] | Freq: Once | INTRAMUSCULAR | Status: DC

## 2019-07-31 ENCOUNTER — Other Ambulatory Visit: Payer: Self-pay

## 2019-07-31 ENCOUNTER — Encounter (HOSPITAL_COMMUNITY): Payer: Self-pay | Admitting: *Deleted

## 2019-07-31 DIAGNOSIS — I2510 Atherosclerotic heart disease of native coronary artery without angina pectoris: Secondary | ICD-10-CM | POA: Diagnosis not present

## 2019-07-31 DIAGNOSIS — Z951 Presence of aortocoronary bypass graft: Secondary | ICD-10-CM | POA: Insufficient documentation

## 2019-07-31 DIAGNOSIS — Z79899 Other long term (current) drug therapy: Secondary | ICD-10-CM | POA: Diagnosis not present

## 2019-07-31 DIAGNOSIS — Z87891 Personal history of nicotine dependence: Secondary | ICD-10-CM | POA: Diagnosis not present

## 2019-07-31 DIAGNOSIS — E039 Hypothyroidism, unspecified: Secondary | ICD-10-CM | POA: Diagnosis not present

## 2019-07-31 DIAGNOSIS — J449 Chronic obstructive pulmonary disease, unspecified: Secondary | ICD-10-CM | POA: Insufficient documentation

## 2019-07-31 DIAGNOSIS — I10 Essential (primary) hypertension: Secondary | ICD-10-CM | POA: Insufficient documentation

## 2019-07-31 NOTE — ED Triage Notes (Signed)
Pt states he went to his kidney doctor today and his BP has been increasing all day; the last BP was 188/60; pt states he has taken his BP medication today with no relief; pt states he can feel like it is elevated

## 2019-08-01 ENCOUNTER — Emergency Department (HOSPITAL_COMMUNITY)
Admission: EM | Admit: 2019-08-01 | Discharge: 2019-08-01 | Disposition: A | Discharge status: Home / Self Care | Payer: No Typology Code available for payment source | Attending: Emergency Medicine | Admitting: Emergency Medicine

## 2019-08-01 DIAGNOSIS — I10 Essential (primary) hypertension: Secondary | ICD-10-CM

## 2019-08-01 MED ORDER — METOPROLOL TARTRATE 25 MG PO TABS
25.0000 mg | ORAL_TABLET | Freq: Once | ORAL | Status: AC
  Administered 2019-08-01: 25 mg via ORAL
  Filled 2019-08-01: qty 1

## 2019-08-01 MED ORDER — AMLODIPINE BESYLATE 5 MG PO TABS
10.0000 mg | ORAL_TABLET | Freq: Every day | ORAL | Status: DC
  Administered 2019-08-01: 10 mg via ORAL
  Filled 2019-08-01: qty 2

## 2019-08-01 MED ORDER — HYDRALAZINE HCL 25 MG PO TABS
100.0000 mg | ORAL_TABLET | Freq: Three times a day (TID) | ORAL | Status: DC
  Administered 2019-08-01: 100 mg via ORAL
  Filled 2019-08-01: qty 4

## 2019-08-01 MED ORDER — LOSARTAN POTASSIUM 25 MG PO TABS
50.0000 mg | ORAL_TABLET | Freq: Every day | ORAL | Status: DC
  Administered 2019-08-01: 50 mg via ORAL
  Filled 2019-08-01: qty 2

## 2019-08-01 NOTE — ED Provider Notes (Signed)
[facility] EMERGENCY DEPARTMENT Provider Note   CSN: [id number] Arrival date & time: 07/31/19  2242   Time seen 1:30 AM  History Chief Complaint  Patient presents with  . Hypertension    [name] is a 74 y.o. male.  HPI   Patient states he had an appointment today at his [facility] hospital and his blood pressure at that time was 155/55.  He states he normally takes his blood pressure medicines 3 times a day.  He took his afternoon meds at 4:30 PM.  He states he could tell his blood pressure was still high because he could hear his heart beating with his hearing aids in place.  He states that 8:30 PM his blood pressure was 191/57.  He checked it 3 more times within the next hour and a half and the lowest systolic blood pressure was 187.  He states normally his blood pressure is 120-130 an hour or 2 after he takes his morning blood pressure medications and by 8 PM in the evening it will be in the 517 systolic range and he does not check it anymore after that.  He denies chest pain, headache, visual changes, swelling of his extremities or swelling of his abdomen.  He states he has chronic shortness of breath.  He has not taken his 11 PM medication tonight because he came to the ED.  Patient states in the morning he takes hydralazine, metoprolol, and Imdur 2 tabs.  At 11 PM he takes hydralazine 100 mg, losartan 50 mg, amlodipine 10 mg, and metoprolol 25 mg.  PCP Clinic, [facility]   Past Medical History:  Diagnosis Date  . Acute myocardial infarction of other inferior wall, initial episode of care   . Anginal pain ([place])   . Anxiety   . Arthritis   . Cancer [facility])    bladder 2013 removed, has cystoscopy 10/2016, has returned x 2  . COPD (chronic obstructive pulmonary disease) ([place])   . Coronary artery disease    Artery bypass graft Jan 2008  . Dyspnea   . Dysrhythmia   . GERD (gastroesophageal reflux disease)   . Heart murmur   . History of hiatal hernia   . Hyperlipidemia   .  Hypertension   . Hypothyroidism   . PONV (postoperative nausea and vomiting)   . Sleep apnea   . Stroke ([place])   . Tobacco user     Patient Active Problem List   Diagnosis Date Noted  . (HFpEF) heart failure with preserved ejection fraction ([place]) 05/17/2019  . AKI (acute kidney injury) ([place]) 05/16/2019  . 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 NSTEMI (non-ST elevated myocardial infarction) ([place]) 09/14/2016  . Chest pain 09/14/2016  . Obstructive sleep apnea 09/14/2016  . Acute myocardial infarction of other inferior wall, initial episode of care   . Unstable angina ([place]) 01/30/2014  . OVERWEIGHT/OBESITY 02/02/2010  . HYPERLIPIDEMIA, MIXED 01/22/2009  . OTHER CHEST PAIN 01/22/2009  . TOBACCO USER 01/17/2009  . Essential hypertension 01/17/2009    Past Surgical History:  Procedure Laterality Date  . APPENDECTOMY    . CARDIAC CATHETERIZATION    . CORONARY ARTERY BYPASS GRAFT  06/14/2006   x 5  . CORONARY ARTERY BYPASS GRAFT N/A 09/19/2016   Procedure: REDO CORONARY ARTERY BYPASS GRAFTING (CABG) x two, using right internal mammary artery and left radial artery;  Surgeon: [name], MD;  Location: [facility];  Service: Open Heart Surgery;  Laterality: N/A;  . FOOT SURGERY    .  HERNIA REPAIR    . LEFT HEART CATH AND CORS/GRAFTS ANGIOGRAPHY N/A 09/14/2016   Procedure: Left Heart Cath and Cors/Grafts Angiography;  Surgeon: Troy Sine, MD;  Location: [facility] CV LAB;  Service: Cardiovascular;  Laterality: N/A;  . LEFT HEART CATHETERIZATION WITH CORONARY/GRAFT ANGIOGRAM N/A 01/31/2014   Procedure: LEFT HEART CATHETERIZATION WITH Beatrix Fetters;  Surgeon: Sanda Klein, MD;  Location: [facility] CATH LAB;  Service: Cardiovascular;  Laterality: N/A;  . NOSE SURGERY    . PERCUTANEOUS CORONARY STENT INTERVENTION (PCI-S)  01/31/2014   Procedure: PERCUTANEOUS CORONARY STENT INTERVENTION (PCI-S);  Surgeon: Sanda Klein, MD;  Location: [facility] CATH LAB;  Service: Cardiovascular;;  . RADIAL ARTERY HARVEST  Left 09/19/2016   Procedure: RADIAL ARTERY HARVEST;  Surgeon: [name], MD;  Location: [facility];  Service: Open Heart Surgery;  Laterality: Left;  . TEE WITHOUT CARDIOVERSION N/A 09/19/2016   Procedure: TRANSESOPHAGEAL ECHOCARDIOGRAM (TEE);  Surgeon: [name], MD;  Location: [facility];  Service: Open Heart Surgery;  Laterality: N/A;       Family History  Problem Relation Age of Onset  . Heart attack Mother   . Heart attack Brother     Social History   Tobacco Use  . Smoking status: Former Smoker    Packs/day: 1.00    Years: 40.00    Pack years: 40.00    Types: Cigarettes    Start date: 02/20/1959    Quit date: 09/13/2016    Years since quitting: 2.8  . Smokeless tobacco: Never Used  . Tobacco comment: 10/7 2-8 cigarettes per day. cutting back- AJ  Substance Use Topics  . Alcohol use: No    Alcohol/week: 0.0 standard drinks    Comment: "No, not really"  . Drug use: No    Home Medications Prior to Admission medications   Medication Sig Start Date End Date Taking? Authorizing Provider  acetaminophen (TYLENOL) 325 MG tablet Take 2 tablets (650 mg total) by mouth every 6 (six) hours as needed for mild pain. 09/23/16   Nani Skillern, PA-C  allopurinol (ZYLOPRIM) 100 MG tablet Take 100 mg by mouth 2 (two) times daily. 05/03/19   [provider]  amLODipine (NORVASC) 10 MG tablet Take 1 tablet (10 mg total) by mouth daily. 05/18/19   Roxan Hockey, MD  atorvastatin (LIPITOR) 80 MG tablet Take 80 mg by mouth daily.    [provider]  carvedilol (COREG) 6.25 MG tablet Take 1 tablet (6.25 mg total) by mouth 2 (two) times daily. 05/18/19 05/17/20  Roxan Hockey, MD  clopidogrel (PLAVIX) 75 MG tablet Take 75 mg by mouth daily.    [provider]  cyanocobalamin (,VITAMIN B-12,) 1000 MCG/ML injection Inject into the muscle.    [provider]  epoetin alfa (EPOGEN) 3000 UNIT/ML injection Inject 6,000 Units into the vein every 14 (fourteen)  days.     [provider]  ferrous sulfate 325 (65 FE) MG tablet Take 325 mg by mouth 3 (three) times a week. MWF    [provider]  finasteride (PROSCAR) 5 MG tablet Take 5 mg by mouth daily.    [provider]  folic acid (FOLVITE) 1 MG tablet Take by mouth.    [provider]  furosemide (LASIX) 40 MG tablet Take 1.5 tablets (60 mg total) by mouth 2 (two) times daily. 05/18/19   Roxan Hockey, MD  hydrALAZINE (APRESOLINE) 100 MG tablet Take 1 tablet (100 mg total) by mouth 3 (three) times daily. 05/18/19  Roxan Hockey, MD  isosorbide mononitrate (IMDUR) 120 MG 24 hr tablet Take 1 tablet (120 mg total) by mouth daily. 05/18/19   Roxan Hockey, MD  levothyroxine (SYNTHROID, LEVOTHROID) 50 MCG tablet Take 50 mcg by mouth daily before breakfast.    [provider]  losartan (COZAAR) 25 MG tablet Take 1 tablet (25 mg total) by mouth daily. Start on Monday 05/19/2018 05/18/19   Roxan Hockey, MD  magnesium oxide (MAG-OX) 400 MG tablet Take 420 mg by mouth daily.    [provider]  nitroGLYCERIN (NITROSTAT) 0.4 MG SL tablet Place 0.4 mg under the tongue every 5 (five) minutes as needed for chest pain.    [provider]  Omega-3 Fatty Acids (RA FISH OIL) 1000 MG CAPS Take 2,000 mg by mouth 2 (two) times daily.     [provider]  omeprazole (PRILOSEC) 20 MG capsule Take 40 mg by mouth 2 (two) times daily.     [provider]  tamsulosin (FLOMAX) 0.4 MG CAPS capsule Take 0.4 mg by mouth daily.    [provider]  Vitamin D, Cholecalciferol, 25 MCG (1000 UT) CAPS Take 1 capsule by mouth daily.     [provider]    Allergies    Cardizem cd [diltiazem hcl er beads]  Review of Systems   Review of Systems  All other systems reviewed and are negative.   Physical Exam Updated Vital Signs BP (!) 184/73   Pulse 86   Temp 97.7 F (36.5 C) (Oral)   Resp 18   Ht 5\' 9"  (1.753 m)   Wt 102.1 kg    SpO2 100%   BMI 33.23 kg/m   Vital signs normal except for hypertension   Physical Exam Vitals and nursing note reviewed.  Constitutional:      General: He is not in acute distress.    Appearance: Normal appearance. He is well-developed and normal weight. He is not ill-appearing or toxic-appearing.  HENT:     Head: Normocephalic and atraumatic.     Right Ear: External ear normal.     Left Ear: External ear normal.     Nose: Nose normal. No mucosal edema or rhinorrhea.     Mouth/Throat:     Dentition: No dental abscesses.     Pharynx: No uvula swelling.  Eyes:     Extraocular Movements: Extraocular movements intact.     Conjunctiva/sclera: Conjunctivae normal.     Pupils: Pupils are equal, round, and reactive to light.  Cardiovascular:     Rate and Rhythm: Normal rate and regular rhythm.     Heart sounds: Murmur present. No friction rub. No gallop.      Comments: Patient is aware he has a heart murmur Pulmonary:     Effort: Pulmonary effort is normal. No respiratory distress.     Breath sounds: Normal breath sounds. No wheezing, rhonchi or rales.  Chest:     Chest wall: No tenderness or crepitus.  Musculoskeletal:        General: No tenderness. Normal range of motion.     Cervical back: Full passive range of motion without pain, normal range of motion and neck supple.     Right lower leg: Edema present.     Left lower leg: Edema present.     Comments: Moves all extremities well.  Patient has 1+ pitting edema almost to his knees  Skin:    General: Skin is warm and dry.     Coloration: Skin is not  pale.     Findings: No erythema or rash.  Neurological:     General: No focal deficit present.     Mental Status: He is alert and oriented to person, place, and time.     Cranial Nerves: No cranial nerve deficit.  Psychiatric:        Mood and Affect: Mood normal. Mood is not anxious.        Speech: Speech normal.        Behavior: Behavior normal.        Thought Content:  Thought content normal.     ED Results / Procedures / Treatments   Labs (all labs ordered are listed, but only abnormal results are displayed) Labs Reviewed - No data to display  EKG None  Radiology No results found.  Procedures Procedures (including critical care time)  Medications Ordered in ED Medications  amLODipine (NORVASC) tablet 10 mg (10 mg Oral Given 08/01/19 0153)  hydrALAZINE (APRESOLINE) tablet 100 mg (100 mg Oral Given 08/01/19 0154)  losartan (COZAAR) tablet 50 mg (50 mg Oral Given 08/01/19 0154)  metoprolol tartrate (LOPRESSOR) tablet 25 mg (25 mg Oral Given 08/01/19 0154)    ED Course  I have reviewed the triage vital signs and the nursing notes.  Pertinent labs & imaging results that were available during my care of the patient were reviewed by me and considered in my medical decision making (see chart for details).    MDM Rules/Calculators/A&P                      Patient has no worrisome symptoms with his hypertension such as chest pain or headache.  He was given his 11 PM dose of blood pressure medications.  Recheck around 3 AM, his blood pressure is now 155/52, its about an hour after he received his normal evening dose of blood pressure medications.  He states he feels fine.  We discussed what to do at home if his blood pressure is elevated again.  Final Clinical Impression(s) / ED Diagnoses Final diagnoses:  Essential hypertension    Rx / DC Orders ED Discharge Orders    None     Plan discharge  Rolland Porter, MD, Barbette Or, MD 08/01/19 [id number]

## 2019-08-01 NOTE — Discharge Instructions (Addendum)
If your blood pressure gets high again and your heart rate is over 75, take an extra metoprolol or hydralazine.  If your heart rate is less than 75 take an extra hydralazine.  Wait 1 to 2 hours before checking your blood pressure again.  Recheck if your blood pressure is high and you have a headache, chest pain, or your breathing is worse.

## 2019-08-07 ENCOUNTER — Encounter (HOSPITAL_COMMUNITY)
Admission: RE | Admit: 2019-08-07 | Discharge: 2019-08-07 | Disposition: A | Discharge status: Home / Self Care | Payer: No Typology Code available for payment source | Source: Ambulatory Visit | Attending: Nephrology | Admitting: Nephrology

## 2019-08-07 ENCOUNTER — Other Ambulatory Visit: Payer: Self-pay

## 2019-08-07 ENCOUNTER — Encounter (HOSPITAL_COMMUNITY): Payer: Self-pay

## 2019-08-07 DIAGNOSIS — N184 Chronic kidney disease, stage 4 (severe): Secondary | ICD-10-CM | POA: Diagnosis not present

## 2019-08-07 LAB — POCT HEMOGLOBIN-HEMACUE: Hemoglobin: 7.5 g/dL — ABNORMAL LOW (ref 13.0–17.0)

## 2019-08-07 MED ORDER — EPOETIN ALFA 3000 UNIT/ML IJ SOLN
6000.0000 [IU] | Freq: Once | INTRAMUSCULAR | Status: AC
  Administered 2019-08-07: 14:00:00 6000 [IU] via SUBCUTANEOUS

## 2019-08-22 ENCOUNTER — Encounter (HOSPITAL_COMMUNITY)
Admission: RE | Admit: 2019-08-22 | Discharge: 2019-08-22 | Disposition: A | Discharge status: Home / Self Care | Payer: No Typology Code available for payment source | Source: Ambulatory Visit | Attending: Nephrology | Admitting: Nephrology

## 2019-08-22 ENCOUNTER — Other Ambulatory Visit: Payer: Self-pay

## 2019-08-22 ENCOUNTER — Encounter (HOSPITAL_COMMUNITY): Payer: Self-pay

## 2019-08-22 DIAGNOSIS — N184 Chronic kidney disease, stage 4 (severe): Secondary | ICD-10-CM | POA: Insufficient documentation

## 2019-08-22 DIAGNOSIS — D631 Anemia in chronic kidney disease: Secondary | ICD-10-CM | POA: Insufficient documentation

## 2019-08-22 LAB — POCT HEMOGLOBIN-HEMACUE: Hemoglobin: 7.5 g/dL — ABNORMAL LOW (ref 13.0–17.0)

## 2019-08-22 MED ORDER — EPOETIN ALFA 3000 UNIT/ML IJ SOLN
6000.0000 [IU] | Freq: Once | INTRAMUSCULAR | Status: AC
  Administered 2019-08-22: 13:00:00 6000 [IU] via SUBCUTANEOUS
  Filled 2019-08-22: qty 2

## 2019-09-05 ENCOUNTER — Encounter (HOSPITAL_COMMUNITY)
Admission: RE | Admit: 2019-09-05 | Discharge: 2019-09-05 | Disposition: A | Discharge status: Home / Self Care | Payer: No Typology Code available for payment source | Source: Ambulatory Visit | Attending: Nephrology | Admitting: Nephrology

## 2019-09-05 ENCOUNTER — Encounter (HOSPITAL_COMMUNITY): Payer: Self-pay

## 2019-09-05 ENCOUNTER — Other Ambulatory Visit: Payer: Self-pay

## 2019-09-05 DIAGNOSIS — N184 Chronic kidney disease, stage 4 (severe): Secondary | ICD-10-CM | POA: Diagnosis not present

## 2019-09-05 LAB — POCT HEMOGLOBIN-HEMACUE: Hemoglobin: 8 g/dL — ABNORMAL LOW (ref 13.0–17.0)

## 2019-09-05 MED ORDER — EPOETIN ALFA 3000 UNIT/ML IJ SOLN: 3000.0000 [IU] | Freq: Once | INTRAMUSCULAR | Status: DC

## 2019-09-05 MED ORDER — EPOETIN ALFA 3000 UNIT/ML IJ SOLN
6000.0000 [IU] | Freq: Once | INTRAMUSCULAR | Status: AC
  Administered 2019-09-05: 13:00:00 6000 [IU] via SUBCUTANEOUS

## 2019-09-05 MED ORDER — EPOETIN ALFA 3000 UNIT/ML IJ SOLN
INTRAMUSCULAR | Status: AC
  Filled 2019-09-05: qty 2

## 2019-09-19 ENCOUNTER — Encounter (HOSPITAL_COMMUNITY)
Admission: RE | Admit: 2019-09-19 | Discharge: 2019-09-19 | Disposition: A | Discharge status: Home / Self Care | Payer: No Typology Code available for payment source | Source: Ambulatory Visit | Attending: Nephrology | Admitting: Nephrology

## 2019-09-19 ENCOUNTER — Encounter (HOSPITAL_COMMUNITY): Payer: Self-pay

## 2019-09-19 ENCOUNTER — Other Ambulatory Visit: Payer: Self-pay

## 2019-09-19 DIAGNOSIS — N184 Chronic kidney disease, stage 4 (severe): Secondary | ICD-10-CM | POA: Insufficient documentation

## 2019-09-19 DIAGNOSIS — D509 Iron deficiency anemia, unspecified: Secondary | ICD-10-CM | POA: Diagnosis not present

## 2019-09-19 DIAGNOSIS — D631 Anemia in chronic kidney disease: Secondary | ICD-10-CM | POA: Diagnosis not present

## 2019-09-19 LAB — RENAL FUNCTION PANEL
Albumin: 2.9 g/dL — ABNORMAL LOW (ref 3.5–5.0)
Anion gap: 12 (ref 5–15)
BUN: 63 mg/dL — ABNORMAL HIGH (ref 8–23)
CO2: 23 mmol/L (ref 22–32)
Calcium: 8.1 mg/dL — ABNORMAL LOW (ref 8.9–10.3)
Chloride: 102 mmol/L (ref 98–111)
Creatinine, Ser: 3.06 mg/dL — ABNORMAL HIGH (ref 0.61–1.24)
GFR calc Af Amer: 22 mL/min — ABNORMAL LOW (ref >60)
GFR calc non Af Amer: 19 mL/min — ABNORMAL LOW (ref >60)
Glucose, Bld: 106 mg/dL — ABNORMAL HIGH (ref 70–99)
Phosphorus: 3.8 mg/dL (ref 2.5–4.6)
Potassium: 3.5 mmol/L (ref 3.5–5.1)
Sodium: 137 mmol/L (ref 135–145)

## 2019-09-19 LAB — CBC
HCT: 24.3 % — ABNORMAL LOW (ref 39.0–52.0)
Hemoglobin: 7.6 g/dL — ABNORMAL LOW (ref 13.0–17.0)
MCH: 28.6 pg (ref 26.0–34.0)
MCHC: 31.3 g/dL (ref 30.0–36.0)
MCV: 91.4 fL (ref 80.0–100.0)
Platelets: 145 10*3/uL — ABNORMAL LOW (ref 150–400)
RBC: 2.66 MIL/uL — ABNORMAL LOW (ref 4.22–5.81)
RDW: 15 % (ref 11.5–15.5)
WBC: 4.6 10*3/uL (ref 4.0–10.5)
nRBC: 0 % (ref 0.0–0.2)

## 2019-09-19 LAB — IRON AND TIBC
Iron: 28 ug/dL — ABNORMAL LOW (ref 45–182)
Saturation Ratios: 14 % — ABNORMAL LOW (ref 17.9–39.5)
TIBC: 199 ug/dL — ABNORMAL LOW (ref 250–450)
UIBC: 171 ug/dL

## 2019-09-19 LAB — VITAMIN D 25 HYDROXY (VIT D DEFICIENCY, FRACTURES): Vit D, 25-Hydroxy: 24.75 ng/mL — ABNORMAL LOW (ref 30–100)

## 2019-09-19 LAB — FERRITIN: Ferritin: 269 ng/mL (ref 24–336)

## 2019-09-19 LAB — POCT HEMOGLOBIN-HEMACUE: Hemoglobin: 7.4 g/dL — ABNORMAL LOW (ref 13.0–17.0)

## 2019-09-19 LAB — PROTEIN / CREATININE RATIO, URINE
Creatinine, Urine: 83.32 mg/dL
Protein Creatinine Ratio: 5.54 mg/mg{Cre} — ABNORMAL HIGH (ref 0.00–0.15)
Total Protein, Urine: 462 mg/dL

## 2019-09-19 LAB — HEMOGLOBIN A1C
Hgb A1c MFr Bld: 5.6 % (ref 4.8–5.6)
Mean Plasma Glucose: 114.02 mg/dL

## 2019-09-19 MED ORDER — EPOETIN ALFA 3000 UNIT/ML IJ SOLN
INTRAMUSCULAR | Status: AC
  Filled 2019-09-19: qty 2

## 2019-09-19 MED ORDER — EPOETIN ALFA 3000 UNIT/ML IJ SOLN
6000.0000 [IU] | Freq: Once | INTRAMUSCULAR | Status: AC
  Administered 2019-09-19: 6000 [IU] via SUBCUTANEOUS

## 2019-09-20 LAB — PARATHYROID HORMONE, INTACT (NO CA): PTH: 61 pg/mL (ref 15–65)

## 2019-10-03 ENCOUNTER — Encounter (HOSPITAL_COMMUNITY)
Admission: RE | Admit: 2019-10-03 | Discharge: 2019-10-03 | Disposition: A | Discharge status: Home / Self Care | Payer: No Typology Code available for payment source | Source: Ambulatory Visit | Attending: Nephrology | Admitting: Nephrology

## 2019-10-03 ENCOUNTER — Other Ambulatory Visit: Payer: Self-pay

## 2019-10-03 ENCOUNTER — Encounter (HOSPITAL_COMMUNITY): Payer: Self-pay

## 2019-10-03 DIAGNOSIS — N184 Chronic kidney disease, stage 4 (severe): Secondary | ICD-10-CM | POA: Diagnosis not present

## 2019-10-03 LAB — POCT HEMOGLOBIN-HEMACUE: Hemoglobin: 7.5 g/dL — ABNORMAL LOW (ref 13.0–17.0)

## 2019-10-03 MED ORDER — SODIUM CHLORIDE 0.9 % IV SOLN
510.0000 mg | Freq: Once | INTRAVENOUS | Status: AC
  Administered 2019-10-03: 510 mg via INTRAVENOUS
  Filled 2019-10-03: qty 17

## 2019-10-03 MED ORDER — EPOETIN ALFA 10000 UNIT/ML IJ SOLN
10000.0000 [IU] | Freq: Once | INTRAMUSCULAR | Status: AC
  Administered 2019-10-03: 10000 [IU] via SUBCUTANEOUS
  Filled 2019-10-03: qty 1

## 2019-10-03 MED ORDER — SODIUM CHLORIDE 0.9 % IV SOLN: Freq: Once | INTRAVENOUS | Status: AC

## 2019-10-10 ENCOUNTER — Other Ambulatory Visit: Payer: Self-pay

## 2019-10-10 ENCOUNTER — Encounter (HOSPITAL_COMMUNITY)
Admission: RE | Admit: 2019-10-10 | Discharge: 2019-10-10 | Disposition: A | Discharge status: Home / Self Care | Payer: No Typology Code available for payment source | Source: Ambulatory Visit | Attending: Nephrology | Admitting: Nephrology

## 2019-10-10 ENCOUNTER — Encounter (HOSPITAL_COMMUNITY): Payer: Self-pay

## 2019-10-10 DIAGNOSIS — N184 Chronic kidney disease, stage 4 (severe): Secondary | ICD-10-CM | POA: Diagnosis not present

## 2019-10-10 LAB — SAMPLE TO BLOOD BANK

## 2019-10-10 LAB — HEMOGLOBIN AND HEMATOCRIT, BLOOD
HCT: 24.2 % — ABNORMAL LOW (ref 39.0–52.0)
Hemoglobin: 7.5 g/dL — ABNORMAL LOW (ref 13.0–17.0)

## 2019-10-10 LAB — POCT HEMOGLOBIN-HEMACUE: Hemoglobin: 7.5 g/dL — ABNORMAL LOW (ref 13.0–17.0)

## 2019-10-10 MED ORDER — SODIUM CHLORIDE 0.9 % IV SOLN: Freq: Once | INTRAVENOUS | Status: AC

## 2019-10-10 MED ORDER — SODIUM CHLORIDE 0.9 % IV SOLN
510.0000 mg | Freq: Once | INTRAVENOUS | Status: AC
  Administered 2019-10-10: 510 mg via INTRAVENOUS
  Filled 2019-10-10: qty 17

## 2019-10-10 NOTE — Progress Notes (Signed)
Patient's HGB has consistently been 7.5 at last two appointments, despite Feraheme infusin and increase in Epogen to 10,000 units, which was given on the 20th.  Drew H/H and Hold clot and notified Dr eBay office.  Has an appointment at [facility] today and the above information sent with him, along with labs done on 09/18/19 and HGB trend.

## 2019-10-16 ENCOUNTER — Encounter (HOSPITAL_COMMUNITY)
Admission: RE | Admit: 2019-10-16 | Discharge: 2019-10-16 | Disposition: A | Discharge status: Home / Self Care | Payer: No Typology Code available for payment source | Source: Ambulatory Visit | Attending: Nephrology | Admitting: Nephrology

## 2019-10-16 ENCOUNTER — Encounter (HOSPITAL_COMMUNITY): Payer: Self-pay

## 2019-10-16 ENCOUNTER — Other Ambulatory Visit: Payer: Self-pay

## 2019-10-16 DIAGNOSIS — N184 Chronic kidney disease, stage 4 (severe): Secondary | ICD-10-CM | POA: Insufficient documentation

## 2019-10-16 DIAGNOSIS — D631 Anemia in chronic kidney disease: Secondary | ICD-10-CM | POA: Diagnosis not present

## 2019-10-16 LAB — POCT HEMOGLOBIN-HEMACUE: Hemoglobin: 7.6 g/dL — ABNORMAL LOW (ref 13.0–17.0)

## 2019-10-16 MED ORDER — EPOETIN ALFA 10000 UNIT/ML IJ SOLN
10000.0000 [IU] | Freq: Once | INTRAMUSCULAR | Status: AC
  Administered 2019-10-16: 10000 [IU] via SUBCUTANEOUS

## 2019-10-16 MED ORDER — EPOETIN ALFA 10000 UNIT/ML IJ SOLN
INTRAMUSCULAR | Status: AC
  Filled 2019-10-16: qty 1

## 2019-10-16 MED ORDER — EPOETIN ALFA 3000 UNIT/ML IJ SOLN
INTRAMUSCULAR | Status: AC
  Filled 2019-10-16: qty 1

## 2019-10-17 ENCOUNTER — Encounter (HOSPITAL_COMMUNITY): Payer: No Typology Code available for payment source

## 2019-10-17 ENCOUNTER — Encounter (HOSPITAL_COMMUNITY): Admission: RE | Admit: 2019-10-17 | Payer: No Typology Code available for payment source | Source: Ambulatory Visit

## 2019-10-24 ENCOUNTER — Other Ambulatory Visit (HOSPITAL_COMMUNITY): Payer: No Typology Code available for payment source

## 2019-10-24 ENCOUNTER — Ambulatory Visit (HOSPITAL_COMMUNITY): Payer: No Typology Code available for payment source

## 2019-10-30 ENCOUNTER — Encounter (HOSPITAL_COMMUNITY): Payer: No Typology Code available for payment source

## 2019-10-31 ENCOUNTER — Encounter (HOSPITAL_COMMUNITY)
Admission: RE | Admit: 2019-10-31 | Discharge: 2019-10-31 | Disposition: A | Discharge status: Home / Self Care | Payer: No Typology Code available for payment source | Source: Ambulatory Visit | Attending: Nephrology | Admitting: Nephrology

## 2019-10-31 ENCOUNTER — Encounter (HOSPITAL_COMMUNITY): Payer: Self-pay

## 2019-10-31 ENCOUNTER — Other Ambulatory Visit: Payer: Self-pay

## 2019-10-31 DIAGNOSIS — N184 Chronic kidney disease, stage 4 (severe): Secondary | ICD-10-CM | POA: Diagnosis not present

## 2019-10-31 LAB — POCT HEMOGLOBIN-HEMACUE: Hemoglobin: 8.7 g/dL — ABNORMAL LOW (ref 13.0–17.0)

## 2019-10-31 MED ORDER — EPOETIN ALFA 10000 UNIT/ML IJ SOLN
10000.0000 [IU] | Freq: Once | INTRAMUSCULAR | Status: AC
  Administered 2019-10-31: 10000 [IU] via SUBCUTANEOUS
  Filled 2019-10-31: qty 1

## 2019-11-14 ENCOUNTER — Other Ambulatory Visit: Payer: Self-pay

## 2019-11-14 ENCOUNTER — Encounter (HOSPITAL_COMMUNITY)
Admission: RE | Admit: 2019-11-14 | Discharge: 2019-11-14 | Disposition: A | Discharge status: Home / Self Care | Payer: No Typology Code available for payment source | Source: Ambulatory Visit | Attending: Nephrology | Admitting: Nephrology

## 2019-11-14 ENCOUNTER — Encounter (HOSPITAL_COMMUNITY): Payer: Self-pay

## 2019-11-14 DIAGNOSIS — D631 Anemia in chronic kidney disease: Secondary | ICD-10-CM | POA: Insufficient documentation

## 2019-11-14 DIAGNOSIS — N184 Chronic kidney disease, stage 4 (severe): Secondary | ICD-10-CM | POA: Diagnosis present

## 2019-11-14 LAB — POCT HEMOGLOBIN-HEMACUE: Hemoglobin: 9 g/dL — ABNORMAL LOW (ref 13.0–17.0)

## 2019-11-14 MED ORDER — EPOETIN ALFA 10000 UNIT/ML IJ SOLN
10000.0000 [IU] | Freq: Once | INTRAMUSCULAR | Status: AC
  Administered 2019-11-14: 10000 [IU] via SUBCUTANEOUS
  Filled 2019-11-14: qty 1

## 2019-11-28 ENCOUNTER — Encounter (HOSPITAL_COMMUNITY)
Admission: RE | Admit: 2019-11-28 | Discharge: 2019-11-28 | Disposition: A | Discharge status: Home / Self Care | Payer: No Typology Code available for payment source | Source: Ambulatory Visit | Attending: Nephrology | Admitting: Nephrology

## 2019-11-28 ENCOUNTER — Encounter (HOSPITAL_COMMUNITY): Payer: Self-pay

## 2019-11-28 ENCOUNTER — Other Ambulatory Visit: Payer: Self-pay

## 2019-11-28 DIAGNOSIS — N184 Chronic kidney disease, stage 4 (severe): Secondary | ICD-10-CM | POA: Diagnosis not present

## 2019-11-28 MED ORDER — EPOETIN ALFA 10000 UNIT/ML IJ SOLN
INTRAMUSCULAR | Status: AC
  Filled 2019-11-28: qty 1

## 2019-11-28 MED ORDER — EPOETIN ALFA 10000 UNIT/ML IJ SOLN
10000.0000 [IU] | Freq: Once | INTRAMUSCULAR | Status: AC
  Administered 2019-11-28: 10000 [IU] via SUBCUTANEOUS

## 2019-11-29 LAB — POCT HEMOGLOBIN-HEMACUE: Hemoglobin: 9.1 g/dL — ABNORMAL LOW (ref 13.0–17.0)

## 2019-12-10 ENCOUNTER — Other Ambulatory Visit: Payer: Self-pay

## 2019-12-10 ENCOUNTER — Encounter (HOSPITAL_COMMUNITY): Payer: Self-pay

## 2019-12-10 ENCOUNTER — Emergency Department (HOSPITAL_COMMUNITY)
Admission: EM | Admit: 2019-12-10 | Discharge: 2019-12-10 | Disposition: A | Discharge status: Home / Self Care | Payer: No Typology Code available for payment source | Attending: Emergency Medicine | Admitting: Emergency Medicine

## 2019-12-10 DIAGNOSIS — E875 Hyperkalemia: Secondary | ICD-10-CM | POA: Diagnosis not present

## 2019-12-10 DIAGNOSIS — Z87891 Personal history of nicotine dependence: Secondary | ICD-10-CM | POA: Diagnosis not present

## 2019-12-10 DIAGNOSIS — J449 Chronic obstructive pulmonary disease, unspecified: Secondary | ICD-10-CM | POA: Diagnosis not present

## 2019-12-10 DIAGNOSIS — Z951 Presence of aortocoronary bypass graft: Secondary | ICD-10-CM | POA: Insufficient documentation

## 2019-12-10 DIAGNOSIS — Z8551 Personal history of malignant neoplasm of bladder: Secondary | ICD-10-CM | POA: Diagnosis not present

## 2019-12-10 DIAGNOSIS — N184 Chronic kidney disease, stage 4 (severe): Secondary | ICD-10-CM | POA: Insufficient documentation

## 2019-12-10 DIAGNOSIS — Z79899 Other long term (current) drug therapy: Secondary | ICD-10-CM | POA: Insufficient documentation

## 2019-12-10 DIAGNOSIS — I129 Hypertensive chronic kidney disease with stage 1 through stage 4 chronic kidney disease, or unspecified chronic kidney disease: Secondary | ICD-10-CM | POA: Insufficient documentation

## 2019-12-10 DIAGNOSIS — I2510 Atherosclerotic heart disease of native coronary artery without angina pectoris: Secondary | ICD-10-CM | POA: Diagnosis not present

## 2019-12-10 DIAGNOSIS — N179 Acute kidney failure, unspecified: Secondary | ICD-10-CM | POA: Diagnosis present

## 2019-12-10 LAB — COMPREHENSIVE METABOLIC PANEL
ALT: 21 U/L (ref 0–44)
AST: 17 U/L (ref 15–41)
Albumin: 3.8 g/dL (ref 3.5–5.0)
Alkaline Phosphatase: 67 U/L (ref 38–126)
Anion gap: 9 (ref 5–15)
BUN: 95 mg/dL — ABNORMAL HIGH (ref 8–23)
CO2: 19 mmol/L — ABNORMAL LOW (ref 22–32)
Calcium: 8.7 mg/dL — ABNORMAL LOW (ref 8.9–10.3)
Chloride: 107 mmol/L (ref 98–111)
Creatinine, Ser: 4.39 mg/dL — ABNORMAL HIGH (ref 0.61–1.24)
GFR calc Af Amer: 14 mL/min — ABNORMAL LOW (ref >60)
GFR calc non Af Amer: 12 mL/min — ABNORMAL LOW (ref >60)
Glucose, Bld: 98 mg/dL (ref 70–99)
Potassium: 5.8 mmol/L — ABNORMAL HIGH (ref 3.5–5.1)
Sodium: 135 mmol/L (ref 135–145)
Total Bilirubin: 0.5 mg/dL (ref 0.3–1.2)
Total Protein: 6.8 g/dL (ref 6.5–8.1)

## 2019-12-10 LAB — CBC
HCT: 31.7 % — ABNORMAL LOW (ref 39.0–52.0)
Hemoglobin: 9.9 g/dL — ABNORMAL LOW (ref 13.0–17.0)
MCH: 29.7 pg (ref 26.0–34.0)
MCHC: 31.2 g/dL (ref 30.0–36.0)
MCV: 95.2 fL (ref 80.0–100.0)
Platelets: 162 10*3/uL (ref 150–400)
RBC: 3.33 MIL/uL — ABNORMAL LOW (ref 4.22–5.81)
RDW: 15.8 % — ABNORMAL HIGH (ref 11.5–15.5)
WBC: 5.2 10*3/uL (ref 4.0–10.5)
nRBC: 0 % (ref 0.0–0.2)

## 2019-12-10 LAB — TROPONIN I (HIGH SENSITIVITY)
Troponin I (High Sensitivity): 16 ng/L (ref <18)
Troponin I (High Sensitivity): 16 ng/L (ref <18)

## 2019-12-10 MED ORDER — SODIUM ZIRCONIUM CYCLOSILICATE 5 G PO PACK
10.0000 g | PACK | Freq: Once | ORAL | Status: AC
  Administered 2019-12-10: 10 g via ORAL
  Filled 2019-12-10: qty 2

## 2019-12-10 NOTE — ED Notes (Signed)
Left a message for spouse.

## 2019-12-10 NOTE — ED Provider Notes (Signed)
[facility] EMERGENCY DEPARTMENT Provider Note   CSN: [id number] Arrival date & time: 12/10/19  1332     History Chief Complaint  Patient presents with  . Abnormal Lab    [name] is a 74 y.o. male.  He has multiple medical problems including bladder cancer COPD CAD and CKD.  He follows with Dr. Theador Hawthorne from nephrology.  He has had slowly progressive worsening renal failure.  Was last seen by nephrology about 2 weeks ago.  He had routine lab work done at the [place] yesterday anticipation for bladder surgery in August.  They called him and told him his potassium was elevated and he needed to come to the emergency department.  No acute complaints.  No worsening shortness of breath no syncope no chest pain.  The history is provided by the patient.  Abnormal Lab Time since result:  Yesterday Patient referred by:  PCP Resulting agency:  External Result type: chemistry   Chemistry:    Potassium:  High      Past Medical History:  Diagnosis Date  . Acute myocardial infarction of other inferior wall, initial episode of care   . Anginal pain ([place])   . Anxiety   . Arthritis   . Cancer [facility])    bladder 2013 removed, has cystoscopy 10/2016, has returned x 2  . COPD (chronic obstructive pulmonary disease) ([place])   . Coronary artery disease    Artery bypass graft Jan 2008  . Dyspnea   . Dysrhythmia   . GERD (gastroesophageal reflux disease)   . Heart murmur   . History of hiatal hernia   . Hyperlipidemia   . Hypertension   . Hypothyroidism   . PONV (postoperative nausea and vomiting)   . Sleep apnea   . Stroke ([place])   . Tobacco user     Patient Active Problem List   Diagnosis Date Noted  . (HFpEF) heart failure with preserved ejection fraction ([place]) 05/17/2019  . AKI (acute kidney injury) ([place]) 05/16/2019  . 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 NSTEMI (non-ST elevated myocardial infarction) ([place]) 09/14/2016  . Chest pain 09/14/2016  . Obstructive sleep apnea 09/14/2016  . Acute  myocardial infarction of other inferior wall, initial episode of care   . Unstable angina ([place]) 01/30/2014  . OVERWEIGHT/OBESITY 02/02/2010  . HYPERLIPIDEMIA, MIXED 01/22/2009  . OTHER CHEST PAIN 01/22/2009  . TOBACCO USER 01/17/2009  . Essential hypertension 01/17/2009    Past Surgical History:  Procedure Laterality Date  . APPENDECTOMY    . CARDIAC CATHETERIZATION    . CORONARY ARTERY BYPASS GRAFT  06/14/2006   x 5  . CORONARY ARTERY BYPASS GRAFT N/A 09/19/2016   Procedure: REDO CORONARY ARTERY BYPASS GRAFTING (CABG) x two, using right internal mammary artery and left radial artery;  Surgeon: [name], MD;  Location: [place];  Service: Open Heart Surgery;  Laterality: N/A;  . FOOT SURGERY    . HERNIA REPAIR    . LEFT HEART CATH AND CORS/GRAFTS ANGIOGRAPHY N/A 09/14/2016   Procedure: Left Heart Cath and Cors/Grafts Angiography;  Surgeon: Troy Sine, MD;  Location: [place] CV LAB;  Service: Cardiovascular;  Laterality: N/A;  . LEFT HEART CATHETERIZATION WITH CORONARY/GRAFT ANGIOGRAM N/A 01/31/2014   Procedure: LEFT HEART CATHETERIZATION WITH Beatrix Fetters;  Surgeon: Sanda Klein, MD;  Location: [place] CATH LAB;  Service: Cardiovascular;  Laterality: N/A;  . NOSE SURGERY    . PERCUTANEOUS CORONARY STENT INTERVENTION (PCI-S)  01/31/2014   Procedure: PERCUTANEOUS CORONARY STENT  INTERVENTION (PCI-S);  Surgeon: Sanda Klein, MD;  Location: [facility] CATH LAB;  Service: Cardiovascular;;  . RADIAL ARTERY HARVEST Left 09/19/2016   Procedure: RADIAL ARTERY HARVEST;  Surgeon: [name], MD;  Location: [place];  Service: Open Heart Surgery;  Laterality: Left;  . TEE WITHOUT CARDIOVERSION N/A 09/19/2016   Procedure: TRANSESOPHAGEAL ECHOCARDIOGRAM (TEE);  Surgeon: [name], MD;  Location: [place];  Service: Open Heart Surgery;  Laterality: N/A;       Family History  Problem Relation Age of Onset  . Heart attack Mother   . Heart attack Brother     Social History   Tobacco  Use  . Smoking status: Former Smoker    Packs/day: 1.00    Years: 40.00    Pack years: 40.00    Types: Cigarettes    Start date: 02/20/1959    Quit date: 09/13/2016    Years since quitting: 3.2  . Smokeless tobacco: Never Used  . Tobacco comment: 10/7 2-8 cigarettes per day. cutting back- AJ  Vaping Use  . Vaping Use: Never used  Substance Use Topics  . Alcohol use: No    Alcohol/week: 0.0 standard drinks    Comment: "No, not really"  . Drug use: No    Home Medications Prior to Admission medications   Medication Sig Start Date End Date Taking? Authorizing Provider  acetaminophen (TYLENOL) 325 MG tablet Take 2 tablets (650 mg total) by mouth every 6 (six) hours as needed for mild pain. 09/23/16   Nani Skillern, PA-C  allopurinol (ZYLOPRIM) 100 MG tablet Take 100 mg by mouth 2 (two) times daily. 05/03/19   [provider]  amLODipine (NORVASC) 10 MG tablet Take 1 tablet (10 mg total) by mouth daily. 05/18/19   Roxan Hockey, MD  atorvastatin (LIPITOR) 80 MG tablet Take 80 mg by mouth daily.    [provider]  carvedilol (COREG) 6.25 MG tablet Take 1 tablet (6.25 mg total) by mouth 2 (two) times daily. 05/18/19 05/17/20  Roxan Hockey, MD  clopidogrel (PLAVIX) 75 MG tablet Take 75 mg by mouth daily.    [provider]  cyanocobalamin (,VITAMIN B-12,) 1000 MCG/ML injection Inject into the muscle.    [provider]  epoetin alfa (EPOGEN) 10000 UNIT/ML injection Inject 10,000 Units into the vein every 14 (fourteen) days.     [provider]  ferrous sulfate 325 (65 FE) MG tablet Take 325 mg by mouth 3 (three) times a week. MWF    [provider]  finasteride (PROSCAR) 5 MG tablet Take 5 mg by mouth daily.    [provider]  folic acid (FOLVITE) 1 MG tablet Take by mouth.    [provider]  furosemide (LASIX) 40 MG tablet Take 1.5 tablets (60 mg total) by mouth 2 (two) times daily. 05/18/19   Roxan Hockey, MD   hydrALAZINE (APRESOLINE) 100 MG tablet Take 1 tablet (100 mg total) by mouth 3 (three) times daily. 05/18/19   Roxan Hockey, MD  isosorbide mononitrate (IMDUR) 120 MG 24 hr tablet Take 1 tablet (120 mg total) by mouth daily. 05/18/19   Roxan Hockey, MD  levothyroxine (SYNTHROID, LEVOTHROID) 50 MCG tablet Take 50 mcg by mouth daily before breakfast.    [provider]  losartan (COZAAR) 25 MG tablet Take 1 tablet (25 mg total) by mouth daily. Start on Monday 05/19/2018 05/18/19   Roxan Hockey, MD  magnesium oxide (MAG-OX) 400 MG tablet Take 420 mg by mouth daily.  [provider]  nitroGLYCERIN (NITROSTAT) 0.4 MG SL tablet Place 0.4 mg under the tongue every 5 (five) minutes as needed for chest pain.    [provider]  Omega-3 Fatty Acids (RA FISH OIL) 1000 MG CAPS Take 2,000 mg by mouth 2 (two) times daily.     [provider]  omeprazole (PRILOSEC) 20 MG capsule Take 40 mg by mouth 2 (two) times daily.     [provider]  tamsulosin (FLOMAX) 0.4 MG CAPS capsule Take 0.4 mg by mouth daily.    [provider]  Vitamin D, Cholecalciferol, 25 MCG (1000 UT) CAPS Take 1 capsule by mouth daily.     [provider]    Allergies    Cardizem cd [diltiazem hcl er beads]  Review of Systems   Review of Systems  Constitutional: Negative for fever.  HENT: Negative for sore throat.   Eyes: Negative for visual disturbance.  Respiratory: Negative for shortness of breath.   Cardiovascular: Negative for chest pain.  Gastrointestinal: Negative for abdominal pain.  Genitourinary: Negative for dysuria.  Musculoskeletal: Negative for joint swelling.  Skin: Negative for rash.  Neurological: Negative for headaches.    Physical Exam Updated Vital Signs BP (!) 135/104   Pulse 45   Temp (!) 97.5 F (36.4 C) (Oral)   Resp 12   SpO2 98%   Physical Exam Vitals and nursing note reviewed.  Constitutional:      Appearance: Normal  appearance. He is well-developed.  HENT:     Head: Normocephalic and atraumatic.  Eyes:     Conjunctiva/sclera: Conjunctivae normal.  Cardiovascular:     Rate and Rhythm: Normal rate and regular rhythm.     Heart sounds: No murmur heard.   Pulmonary:     Effort: Pulmonary effort is normal. No respiratory distress.     Breath sounds: Normal breath sounds.  Abdominal:     Palpations: Abdomen is soft.     Tenderness: There is no abdominal tenderness.  Musculoskeletal:        General: No deformity.     Cervical back: Neck supple.  Skin:    General: Skin is warm and dry.     Capillary Refill: Capillary refill takes less than 2 seconds.  Neurological:     General: No focal deficit present.     Mental Status: He is alert.     ED Results / Procedures / Treatments   Labs (all labs ordered are listed, but only abnormal results are displayed) Labs Reviewed  CBC - Abnormal; Notable for the following components:      Result Value   RBC 3.33 (*)    Hemoglobin 9.9 (*)    HCT 31.7 (*)    RDW 15.8 (*)    All other components within normal limits  COMPREHENSIVE METABOLIC PANEL - Abnormal; Notable for the following components:   Potassium 5.8 (*)    CO2 19 (*)    BUN 95 (*)    Creatinine, Ser 4.39 (*)    Calcium 8.7 (*)    GFR calc non Af Amer 12 (*)    GFR calc Af Amer 14 (*)    All other components within normal limits  TROPONIN I (HIGH SENSITIVITY)  TROPONIN I (HIGH SENSITIVITY)    EKG EKG Interpretation  Date/Time:  Tuesday December 10 2019 14:06:07 EDT Ventricular Rate:  56 PR Interval:  150 QRS Duration: 72 QT Interval:  410 QTC Calculation: 395 R Axis:   27 Text Interpretation: Sinus  bradycardia Otherwise normal ECG sl increase t wave peaking from prior 12/20 Confirmed by Aletta Edouard [phone]) on 12/10/2019 3:08:27 PM   Radiology No results found.  Procedures Procedures (including critical care time)  Medications Ordered in ED Medications  sodium zirconium  cyclosilicate (LOKELMA) packet 10 g (has no administration in time range)    ED Course  I have reviewed the triage vital signs and the nursing notes.  Pertinent labs & imaging results that were available during my care of the patient were reviewed by me and considered in my medical decision making (see chart for details).    MDM Rules/Calculators/A&P                         This patient complains of elevated potassium; this involves an extensive number of treatment Options and is a complaint that carries with it a high risk of complications and Morbidity. The differential includes hyperkalemia, arrhythmia, renal failure, other metabolic derangements, lab error  I ordered, reviewed and interpreted labs, which included CBC with normal white count, stable hemoglobin, chemistries with elevated creatinine from baseline and elevated potassium, low bicarb, troponin elevated but delta flat I ordered medication oral Lokelma Previous records obtained and reviewed in epic including prior potassium and creatinine's and recent outpatient nephrology outpatient visit with Dr. August Albino I consulted Dr. Theador Hawthorne and discussed lab and imaging findings.  He felt that giving the patient some oral low, and closely following him up tomorrow in the clinic would be appropriate.  Patient comfortable with plan.  Critical Interventions: None.  After the interventions stated above, I reevaluated the patient and found patient to be asymptomatic.  He understands plan for close follow-up and repeat lab work with Dr. August Albino.  Return instructions discussed.  Patient was discharged with written and oral discharge instructions as epic was down at the time.  Final Clinical Impression(s) / ED Diagnoses Final diagnoses:  Hyperkalemia  Stage 4 chronic kidney disease ([place])    Rx / DC Orders ED Discharge Orders    None       Hayden Rasmussen, MD 12/11/19 1207

## 2019-12-10 NOTE — ED Triage Notes (Signed)
Pt was sent here by [facility]. Pt's Potassium level was 5.7. Pt has stage 4 kidney failure. Pt is supposed to be having bladder surgery on August 10th.

## 2019-12-12 ENCOUNTER — Other Ambulatory Visit: Payer: Self-pay

## 2019-12-12 ENCOUNTER — Encounter (HOSPITAL_COMMUNITY): Payer: Self-pay

## 2019-12-12 ENCOUNTER — Encounter (HOSPITAL_COMMUNITY)
Admission: RE | Admit: 2019-12-12 | Discharge: 2019-12-12 | Disposition: A | Discharge status: Home / Self Care | Payer: No Typology Code available for payment source | Source: Ambulatory Visit | Attending: Nephrology | Admitting: Nephrology

## 2019-12-12 DIAGNOSIS — N184 Chronic kidney disease, stage 4 (severe): Secondary | ICD-10-CM | POA: Diagnosis not present

## 2019-12-12 LAB — POCT HEMOGLOBIN-HEMACUE: Hemoglobin: 9 g/dL — ABNORMAL LOW (ref 13.0–17.0)

## 2019-12-12 LAB — POTASSIUM: Potassium: 5.3 mmol/L — ABNORMAL HIGH (ref 3.5–5.1)

## 2019-12-12 MED ORDER — EPOETIN ALFA 10000 UNIT/ML IJ SOLN
10000.0000 [IU] | Freq: Once | INTRAMUSCULAR | Status: AC
  Administered 2019-12-12: 10000 [IU] via SUBCUTANEOUS
  Filled 2019-12-12: qty 1

## 2019-12-16 DIAGNOSIS — E875 Hyperkalemia: Secondary | ICD-10-CM | POA: Insufficient documentation

## 2019-12-16 DIAGNOSIS — N179 Acute kidney failure, unspecified: Secondary | ICD-10-CM | POA: Diagnosis not present

## 2019-12-16 DIAGNOSIS — K529 Noninfective gastroenteritis and colitis, unspecified: Secondary | ICD-10-CM | POA: Insufficient documentation

## 2019-12-16 DIAGNOSIS — I2510 Atherosclerotic heart disease of native coronary artery without angina pectoris: Secondary | ICD-10-CM | POA: Insufficient documentation

## 2019-12-16 DIAGNOSIS — R109 Unspecified abdominal pain: Secondary | ICD-10-CM | POA: Diagnosis not present

## 2019-12-16 DIAGNOSIS — N051 Unspecified nephritic syndrome with focal and segmental glomerular lesions: Secondary | ICD-10-CM | POA: Insufficient documentation

## 2019-12-16 DIAGNOSIS — Z8551 Personal history of malignant neoplasm of bladder: Secondary | ICD-10-CM | POA: Insufficient documentation

## 2019-12-20 ENCOUNTER — Other Ambulatory Visit (HOSPITAL_COMMUNITY)
Admission: RE | Admit: 2019-12-20 | Discharge: 2019-12-20 | Disposition: A | Discharge status: Home / Self Care | Payer: Medicare Other | Source: Ambulatory Visit | Attending: Nephrology | Admitting: Nephrology

## 2019-12-20 DIAGNOSIS — N189 Chronic kidney disease, unspecified: Secondary | ICD-10-CM | POA: Diagnosis not present

## 2019-12-20 DIAGNOSIS — E1122 Type 2 diabetes mellitus with diabetic chronic kidney disease: Secondary | ICD-10-CM | POA: Diagnosis not present

## 2019-12-20 DIAGNOSIS — N170 Acute kidney failure with tubular necrosis: Secondary | ICD-10-CM | POA: Diagnosis not present

## 2019-12-20 DIAGNOSIS — E875 Hyperkalemia: Secondary | ICD-10-CM | POA: Diagnosis not present

## 2019-12-20 DIAGNOSIS — E872 Acidosis: Secondary | ICD-10-CM | POA: Diagnosis not present

## 2019-12-20 LAB — RENAL FUNCTION PANEL
Albumin: 3.4 g/dL — ABNORMAL LOW (ref 3.5–5.0)
Anion gap: 8 (ref 5–15)
BUN: 83 mg/dL — ABNORMAL HIGH (ref 8–23)
CO2: 12 mmol/L — ABNORMAL LOW (ref 22–32)
Calcium: 8.6 mg/dL — ABNORMAL LOW (ref 8.9–10.3)
Chloride: 115 mmol/L — ABNORMAL HIGH (ref 98–111)
Creatinine, Ser: 3.94 mg/dL — ABNORMAL HIGH (ref 0.61–1.24)
GFR calc Af Amer: 16 mL/min — ABNORMAL LOW (ref >60)
GFR calc non Af Amer: 14 mL/min — ABNORMAL LOW (ref >60)
Glucose, Bld: 110 mg/dL — ABNORMAL HIGH (ref 70–99)
Phosphorus: 5.1 mg/dL — ABNORMAL HIGH (ref 2.5–4.6)
Potassium: 5.3 mmol/L — ABNORMAL HIGH (ref 3.5–5.1)
Sodium: 135 mmol/L (ref 135–145)

## 2019-12-24 ENCOUNTER — Other Ambulatory Visit (HOSPITAL_COMMUNITY)
Admission: RE | Admit: 2019-12-24 | Discharge: 2019-12-24 | Disposition: A | Discharge status: Home / Self Care | Payer: No Typology Code available for payment source | Source: Ambulatory Visit | Attending: Nephrology | Admitting: Nephrology

## 2019-12-24 DIAGNOSIS — N189 Chronic kidney disease, unspecified: Secondary | ICD-10-CM | POA: Diagnosis present

## 2019-12-24 DIAGNOSIS — E1129 Type 2 diabetes mellitus with other diabetic kidney complication: Secondary | ICD-10-CM | POA: Insufficient documentation

## 2019-12-24 DIAGNOSIS — N185 Chronic kidney disease, stage 5: Secondary | ICD-10-CM | POA: Insufficient documentation

## 2019-12-24 DIAGNOSIS — D631 Anemia in chronic kidney disease: Secondary | ICD-10-CM | POA: Insufficient documentation

## 2019-12-24 DIAGNOSIS — E1122 Type 2 diabetes mellitus with diabetic chronic kidney disease: Secondary | ICD-10-CM | POA: Insufficient documentation

## 2019-12-24 DIAGNOSIS — R809 Proteinuria, unspecified: Secondary | ICD-10-CM | POA: Insufficient documentation

## 2019-12-24 LAB — IRON AND TIBC
Iron: 61 ug/dL (ref 45–182)
Saturation Ratios: 28 % (ref 17.9–39.5)
TIBC: 217 ug/dL — ABNORMAL LOW (ref 250–450)
UIBC: 156 ug/dL

## 2019-12-24 LAB — RENAL FUNCTION PANEL
Albumin: 3.5 g/dL (ref 3.5–5.0)
Anion gap: 7 (ref 5–15)
BUN: 68 mg/dL — ABNORMAL HIGH (ref 8–23)
CO2: 19 mmol/L — ABNORMAL LOW (ref 22–32)
Calcium: 8.9 mg/dL (ref 8.9–10.3)
Chloride: 111 mmol/L (ref 98–111)
Creatinine, Ser: 3.63 mg/dL — ABNORMAL HIGH (ref 0.61–1.24)
GFR calc Af Amer: 18 mL/min — ABNORMAL LOW (ref >60)
GFR calc non Af Amer: 16 mL/min — ABNORMAL LOW (ref >60)
Glucose, Bld: 109 mg/dL — ABNORMAL HIGH (ref 70–99)
Phosphorus: 4.1 mg/dL (ref 2.5–4.6)
Potassium: 4.6 mmol/L (ref 3.5–5.1)
Sodium: 137 mmol/L (ref 135–145)

## 2019-12-24 LAB — CBC
HCT: 29.2 % — ABNORMAL LOW (ref 39.0–52.0)
Hemoglobin: 9.3 g/dL — ABNORMAL LOW (ref 13.0–17.0)
MCH: 29.8 pg (ref 26.0–34.0)
MCHC: 31.8 g/dL (ref 30.0–36.0)
MCV: 93.6 fL (ref 80.0–100.0)
Platelets: 142 10*3/uL — ABNORMAL LOW (ref 150–400)
RBC: 3.12 MIL/uL — ABNORMAL LOW (ref 4.22–5.81)
RDW: 16.1 % — ABNORMAL HIGH (ref 11.5–15.5)
WBC: 4.3 10*3/uL (ref 4.0–10.5)
nRBC: 0 % (ref 0.0–0.2)

## 2019-12-24 LAB — PROTEIN / CREATININE RATIO, URINE
Creatinine, Urine: 106.72 mg/dL
Protein Creatinine Ratio: 2.87 mg/mg{Cre} — ABNORMAL HIGH (ref 0.00–0.15)
Total Protein, Urine: 306 mg/dL

## 2019-12-24 LAB — HEPATITIS B SURFACE ANTIGEN: Hepatitis B Surface Ag: NONREACTIVE

## 2019-12-24 LAB — HEPATITIS C ANTIBODY: HCV Ab: NONREACTIVE

## 2019-12-24 LAB — HEPATITIS B CORE ANTIBODY, IGM: Hep B C IgM: NONREACTIVE

## 2019-12-24 LAB — VITAMIN D 25 HYDROXY (VIT D DEFICIENCY, FRACTURES): Vit D, 25-Hydroxy: 25.18 ng/mL — ABNORMAL LOW (ref 30–100)

## 2019-12-25 LAB — PTH, INTACT AND CALCIUM
Calcium, Total (PTH): 8.7 mg/dL (ref 8.6–10.2)
PTH: 28 pg/mL (ref 15–65)

## 2019-12-25 LAB — SOLUBLE TRANSFERRIN RECEPTOR: Transferrin Receptor: 22.9 nmol/L (ref 12.2–27.3)

## 2019-12-25 LAB — HEPATITIS B SURFACE ANTIBODY, QUANTITATIVE: Hep B S AB Quant (Post): <3.1 m[IU]/mL — ABNORMAL LOW (ref >9.9)

## 2019-12-26 ENCOUNTER — Encounter (HOSPITAL_COMMUNITY): Payer: Self-pay

## 2019-12-26 ENCOUNTER — Other Ambulatory Visit: Payer: Self-pay

## 2019-12-26 ENCOUNTER — Encounter (HOSPITAL_COMMUNITY)
Admission: RE | Admit: 2019-12-26 | Discharge: 2019-12-26 | Disposition: A | Discharge status: Home / Self Care | Payer: No Typology Code available for payment source | Source: Ambulatory Visit | Attending: Nephrology | Admitting: Nephrology

## 2019-12-26 DIAGNOSIS — N186 End stage renal disease: Secondary | ICD-10-CM

## 2019-12-26 DIAGNOSIS — R809 Proteinuria, unspecified: Secondary | ICD-10-CM | POA: Insufficient documentation

## 2019-12-26 DIAGNOSIS — E1122 Type 2 diabetes mellitus with diabetic chronic kidney disease: Secondary | ICD-10-CM | POA: Diagnosis not present

## 2019-12-26 DIAGNOSIS — N041 Nephrotic syndrome with focal and segmental glomerular lesions: Secondary | ICD-10-CM | POA: Insufficient documentation

## 2019-12-26 DIAGNOSIS — E875 Hyperkalemia: Secondary | ICD-10-CM | POA: Diagnosis not present

## 2019-12-26 DIAGNOSIS — E872 Acidosis: Secondary | ICD-10-CM | POA: Insufficient documentation

## 2019-12-26 DIAGNOSIS — N170 Acute kidney failure with tubular necrosis: Secondary | ICD-10-CM | POA: Diagnosis present

## 2019-12-26 DIAGNOSIS — I5032 Chronic diastolic (congestive) heart failure: Secondary | ICD-10-CM | POA: Diagnosis not present

## 2019-12-26 DIAGNOSIS — N189 Chronic kidney disease, unspecified: Secondary | ICD-10-CM | POA: Insufficient documentation

## 2019-12-26 DIAGNOSIS — E1129 Type 2 diabetes mellitus with other diabetic kidney complication: Secondary | ICD-10-CM | POA: Insufficient documentation

## 2019-12-26 DIAGNOSIS — Z992 Dependence on renal dialysis: Secondary | ICD-10-CM

## 2019-12-26 DIAGNOSIS — D631 Anemia in chronic kidney disease: Secondary | ICD-10-CM | POA: Insufficient documentation

## 2019-12-26 LAB — RENAL FUNCTION PANEL
Albumin: 3.4 g/dL — ABNORMAL LOW (ref 3.5–5.0)
Anion gap: 8 (ref 5–15)
BUN: 71 mg/dL — ABNORMAL HIGH (ref 8–23)
CO2: 20 mmol/L — ABNORMAL LOW (ref 22–32)
Calcium: 8.2 mg/dL — ABNORMAL LOW (ref 8.9–10.3)
Chloride: 107 mmol/L (ref 98–111)
Creatinine, Ser: 4.14 mg/dL — ABNORMAL HIGH (ref 0.61–1.24)
GFR calc Af Amer: 15 mL/min — ABNORMAL LOW (ref >60)
GFR calc non Af Amer: 13 mL/min — ABNORMAL LOW (ref >60)
Glucose, Bld: 99 mg/dL (ref 70–99)
Phosphorus: 4.7 mg/dL — ABNORMAL HIGH (ref 2.5–4.6)
Potassium: 4.6 mmol/L (ref 3.5–5.1)
Sodium: 135 mmol/L (ref 135–145)

## 2019-12-26 LAB — QUANTIFERON-TB GOLD PLUS (RQFGPL)
QuantiFERON Mitogen Value: >10 IU/mL
QuantiFERON Nil Value: 0 IU/mL
QuantiFERON TB1 Ag Value: 0.08 IU/mL
QuantiFERON TB2 Ag Value: 0.16 IU/mL

## 2019-12-26 LAB — QUANTIFERON-TB GOLD PLUS: QuantiFERON-TB Gold Plus: NEGATIVE

## 2019-12-26 LAB — POCT HEMOGLOBIN-HEMACUE: Hemoglobin: 8.9 g/dL — ABNORMAL LOW (ref 13.0–17.0)

## 2019-12-26 MED ORDER — EPOETIN ALFA 10000 UNIT/ML IJ SOLN
10000.0000 [IU] | Freq: Once | INTRAMUSCULAR | Status: AC
  Administered 2019-12-26: 10000 [IU] via SUBCUTANEOUS
  Filled 2019-12-26: qty 1

## 2020-01-06 ENCOUNTER — Ambulatory Visit (HOSPITAL_COMMUNITY): Admission: RE | Admit: 2020-01-06 | Payer: Medicare Other | Source: Ambulatory Visit

## 2020-01-06 ENCOUNTER — Ambulatory Visit (HOSPITAL_COMMUNITY): Payer: Medicare Other

## 2020-01-06 ENCOUNTER — Ambulatory Visit (HOSPITAL_COMMUNITY): Payer: No Typology Code available for payment source

## 2020-01-09 ENCOUNTER — Encounter (HOSPITAL_COMMUNITY)
Admission: RE | Admit: 2020-01-09 | Discharge: 2020-01-09 | Disposition: A | Discharge status: Home / Self Care | Payer: No Typology Code available for payment source | Source: Ambulatory Visit | Attending: Nephrology | Admitting: Nephrology

## 2020-01-09 ENCOUNTER — Other Ambulatory Visit: Payer: Self-pay

## 2020-01-09 ENCOUNTER — Encounter (HOSPITAL_COMMUNITY): Payer: Self-pay

## 2020-01-09 DIAGNOSIS — N170 Acute kidney failure with tubular necrosis: Secondary | ICD-10-CM | POA: Diagnosis not present

## 2020-01-09 LAB — POCT HEMOGLOBIN-HEMACUE: Hemoglobin: 9.5 g/dL — ABNORMAL LOW (ref 13.0–17.0)

## 2020-01-09 MED ORDER — EPOETIN ALFA 10000 UNIT/ML IJ SOLN
INTRAMUSCULAR | Status: AC
  Filled 2020-01-09: qty 1

## 2020-01-09 MED ORDER — EPOETIN ALFA 10000 UNIT/ML IJ SOLN
10000.0000 [IU] | Freq: Once | INTRAMUSCULAR | Status: AC
  Administered 2020-01-09: 10000 [IU] via SUBCUTANEOUS

## 2020-01-13 ENCOUNTER — Encounter: Payer: No Typology Code available for payment source | Admitting: Vascular Surgery

## 2020-01-23 ENCOUNTER — Other Ambulatory Visit: Payer: Self-pay

## 2020-01-23 ENCOUNTER — Encounter (HOSPITAL_COMMUNITY)
Admission: RE | Admit: 2020-01-23 | Discharge: 2020-01-23 | Disposition: A | Discharge status: Home / Self Care | Payer: No Typology Code available for payment source | Source: Ambulatory Visit | Attending: Nephrology | Admitting: Nephrology

## 2020-01-23 DIAGNOSIS — I5032 Chronic diastolic (congestive) heart failure: Secondary | ICD-10-CM | POA: Insufficient documentation

## 2020-01-23 DIAGNOSIS — I130 Hypertensive heart and chronic kidney disease with heart failure and stage 1 through stage 4 chronic kidney disease, or unspecified chronic kidney disease: Secondary | ICD-10-CM | POA: Insufficient documentation

## 2020-01-23 DIAGNOSIS — N185 Chronic kidney disease, stage 5: Secondary | ICD-10-CM | POA: Diagnosis not present

## 2020-01-23 DIAGNOSIS — D631 Anemia in chronic kidney disease: Secondary | ICD-10-CM | POA: Insufficient documentation

## 2020-01-23 DIAGNOSIS — I129 Hypertensive chronic kidney disease with stage 1 through stage 4 chronic kidney disease, or unspecified chronic kidney disease: Secondary | ICD-10-CM | POA: Diagnosis present

## 2020-01-23 LAB — POCT HEMOGLOBIN-HEMACUE: Hemoglobin: 10.4 g/dL — ABNORMAL LOW (ref 13.0–17.0)

## 2020-02-03 ENCOUNTER — Ambulatory Visit (HOSPITAL_COMMUNITY)
Admission: RE | Admit: 2020-02-03 | Discharge: 2020-02-03 | Disposition: A | Discharge status: Home / Self Care | Payer: No Typology Code available for payment source | Source: Ambulatory Visit | Attending: Vascular Surgery | Admitting: Vascular Surgery

## 2020-02-03 ENCOUNTER — Other Ambulatory Visit: Payer: Self-pay

## 2020-02-03 DIAGNOSIS — N186 End stage renal disease: Secondary | ICD-10-CM | POA: Diagnosis not present

## 2020-02-03 DIAGNOSIS — Z992 Dependence on renal dialysis: Secondary | ICD-10-CM | POA: Insufficient documentation

## 2020-02-03 DIAGNOSIS — I739 Peripheral vascular disease, unspecified: Secondary | ICD-10-CM | POA: Diagnosis not present

## 2020-02-06 ENCOUNTER — Encounter (HOSPITAL_COMMUNITY): Payer: Self-pay

## 2020-02-06 ENCOUNTER — Encounter (HOSPITAL_COMMUNITY)
Admission: RE | Admit: 2020-02-06 | Discharge: 2020-02-06 | Disposition: A | Discharge status: Home / Self Care | Payer: No Typology Code available for payment source | Source: Ambulatory Visit | Attending: Nephrology | Admitting: Nephrology

## 2020-02-06 ENCOUNTER — Other Ambulatory Visit: Payer: Self-pay

## 2020-02-06 DIAGNOSIS — E860 Dehydration: Secondary | ICD-10-CM | POA: Diagnosis not present

## 2020-02-06 LAB — CBC
HCT: 30.3 % — ABNORMAL LOW (ref 39.0–52.0)
Hemoglobin: 9.8 g/dL — ABNORMAL LOW (ref 13.0–17.0)
MCH: 30.3 pg (ref 26.0–34.0)
MCHC: 32.3 g/dL (ref 30.0–36.0)
MCV: 93.8 fL (ref 80.0–100.0)
Platelets: 143 10*3/uL — ABNORMAL LOW (ref 150–400)
RBC: 3.23 MIL/uL — ABNORMAL LOW (ref 4.22–5.81)
RDW: 14.3 % (ref 11.5–15.5)
WBC: 5 10*3/uL (ref 4.0–10.5)
nRBC: 0 % (ref 0.0–0.2)

## 2020-02-06 LAB — PROTEIN / CREATININE RATIO, URINE
Creatinine, Urine: 240.35 mg/dL
Protein Creatinine Ratio: 0.68 mg/mg{Cre} — ABNORMAL HIGH (ref 0.00–0.15)
Total Protein, Urine: 163 mg/dL

## 2020-02-06 LAB — RENAL FUNCTION PANEL
Albumin: 3.8 g/dL (ref 3.5–5.0)
Anion gap: 11 (ref 5–15)
BUN: 122 mg/dL — ABNORMAL HIGH (ref 8–23)
CO2: 17 mmol/L — ABNORMAL LOW (ref 22–32)
Calcium: 9 mg/dL (ref 8.9–10.3)
Chloride: 107 mmol/L (ref 98–111)
Creatinine, Ser: 5.06 mg/dL — ABNORMAL HIGH (ref 0.61–1.24)
GFR calc Af Amer: 12 mL/min — ABNORMAL LOW (ref >60)
GFR calc non Af Amer: 10 mL/min — ABNORMAL LOW (ref >60)
Glucose, Bld: 117 mg/dL — ABNORMAL HIGH (ref 70–99)
Phosphorus: 5.2 mg/dL — ABNORMAL HIGH (ref 2.5–4.6)
Potassium: 4.3 mmol/L (ref 3.5–5.1)
Sodium: 135 mmol/L (ref 135–145)

## 2020-02-06 LAB — POCT HEMOGLOBIN-HEMACUE: Hemoglobin: 9.9 g/dL — ABNORMAL LOW (ref 13.0–17.0)

## 2020-02-06 LAB — IRON AND TIBC
Iron: 141 ug/dL (ref 45–182)
Saturation Ratios: 63 % — ABNORMAL HIGH (ref 17.9–39.5)
TIBC: 222 ug/dL — ABNORMAL LOW (ref 250–450)
UIBC: 81 ug/dL

## 2020-02-06 LAB — VITAMIN D 25 HYDROXY (VIT D DEFICIENCY, FRACTURES): Vit D, 25-Hydroxy: 33.77 ng/mL (ref 30–100)

## 2020-02-06 MED ORDER — EPOETIN ALFA 10000 UNIT/ML IJ SOLN: 10000.0000 [IU] | Freq: Once | INTRAMUSCULAR | Status: DC

## 2020-02-07 ENCOUNTER — Encounter (HOSPITAL_COMMUNITY): Payer: Self-pay

## 2020-02-07 ENCOUNTER — Other Ambulatory Visit: Payer: Self-pay

## 2020-02-07 ENCOUNTER — Emergency Department (HOSPITAL_COMMUNITY): Payer: No Typology Code available for payment source

## 2020-02-07 ENCOUNTER — Inpatient Hospital Stay (HOSPITAL_COMMUNITY): Payer: No Typology Code available for payment source

## 2020-02-07 ENCOUNTER — Inpatient Hospital Stay (HOSPITAL_COMMUNITY)
Admission: EM | Admit: 2020-02-07 | Discharge: 2020-02-11 | DRG: 629 | Disposition: A | Discharge status: Home / Self Care | Payer: No Typology Code available for payment source | Attending: Internal Medicine | Admitting: Internal Medicine

## 2020-02-07 DIAGNOSIS — E872 Acidosis: Secondary | ICD-10-CM | POA: Diagnosis present

## 2020-02-07 DIAGNOSIS — Z8551 Personal history of malignant neoplasm of bladder: Secondary | ICD-10-CM

## 2020-02-07 DIAGNOSIS — N281 Cyst of kidney, acquired: Secondary | ICD-10-CM | POA: Diagnosis present

## 2020-02-07 DIAGNOSIS — D631 Anemia in chronic kidney disease: Secondary | ICD-10-CM | POA: Diagnosis present

## 2020-02-07 DIAGNOSIS — N185 Chronic kidney disease, stage 5: Secondary | ICD-10-CM | POA: Diagnosis present

## 2020-02-07 DIAGNOSIS — I2510 Atherosclerotic heart disease of native coronary artery without angina pectoris: Secondary | ICD-10-CM | POA: Diagnosis present

## 2020-02-07 DIAGNOSIS — Z20822 Contact with and (suspected) exposure to covid-19: Secondary | ICD-10-CM | POA: Diagnosis present

## 2020-02-07 DIAGNOSIS — I5030 Unspecified diastolic (congestive) heart failure: Secondary | ICD-10-CM | POA: Diagnosis present

## 2020-02-07 DIAGNOSIS — Z6829 Body mass index (BMI) 29.0-29.9, adult: Secondary | ICD-10-CM

## 2020-02-07 DIAGNOSIS — E861 Hypovolemia: Secondary | ICD-10-CM | POA: Diagnosis present

## 2020-02-07 DIAGNOSIS — K8020 Calculus of gallbladder without cholecystitis without obstruction: Secondary | ICD-10-CM | POA: Diagnosis present

## 2020-02-07 DIAGNOSIS — E039 Hypothyroidism, unspecified: Secondary | ICD-10-CM | POA: Diagnosis present

## 2020-02-07 DIAGNOSIS — E782 Mixed hyperlipidemia: Secondary | ICD-10-CM | POA: Diagnosis present

## 2020-02-07 DIAGNOSIS — R001 Bradycardia, unspecified: Secondary | ICD-10-CM | POA: Diagnosis present

## 2020-02-07 DIAGNOSIS — Z79899 Other long term (current) drug therapy: Secondary | ICD-10-CM

## 2020-02-07 DIAGNOSIS — N289 Disorder of kidney and ureter, unspecified: Secondary | ICD-10-CM | POA: Diagnosis present

## 2020-02-07 DIAGNOSIS — Z8249 Family history of ischemic heart disease and other diseases of the circulatory system: Secondary | ICD-10-CM

## 2020-02-07 DIAGNOSIS — J449 Chronic obstructive pulmonary disease, unspecified: Secondary | ICD-10-CM | POA: Diagnosis present

## 2020-02-07 DIAGNOSIS — K219 Gastro-esophageal reflux disease without esophagitis: Secondary | ICD-10-CM | POA: Diagnosis present

## 2020-02-07 DIAGNOSIS — E860 Dehydration: Secondary | ICD-10-CM | POA: Diagnosis present

## 2020-02-07 DIAGNOSIS — N179 Acute kidney failure, unspecified: Secondary | ICD-10-CM | POA: Diagnosis present

## 2020-02-07 DIAGNOSIS — Z7902 Long term (current) use of antithrombotics/antiplatelets: Secondary | ICD-10-CM

## 2020-02-07 DIAGNOSIS — I132 Hypertensive heart and chronic kidney disease with heart failure and with stage 5 chronic kidney disease, or end stage renal disease: Secondary | ICD-10-CM | POA: Diagnosis present

## 2020-02-07 DIAGNOSIS — I252 Old myocardial infarction: Secondary | ICD-10-CM

## 2020-02-07 DIAGNOSIS — N19 Unspecified kidney failure: Secondary | ICD-10-CM

## 2020-02-07 DIAGNOSIS — E669 Obesity, unspecified: Secondary | ICD-10-CM | POA: Diagnosis present

## 2020-02-07 DIAGNOSIS — N184 Chronic kidney disease, stage 4 (severe): Secondary | ICD-10-CM | POA: Diagnosis not present

## 2020-02-07 DIAGNOSIS — Z888 Allergy status to other drugs, medicaments and biological substances status: Secondary | ICD-10-CM

## 2020-02-07 DIAGNOSIS — F419 Anxiety disorder, unspecified: Secondary | ICD-10-CM | POA: Diagnosis present

## 2020-02-07 DIAGNOSIS — I5032 Chronic diastolic (congestive) heart failure: Secondary | ICD-10-CM | POA: Diagnosis present

## 2020-02-07 DIAGNOSIS — Z955 Presence of coronary angioplasty implant and graft: Secondary | ICD-10-CM

## 2020-02-07 DIAGNOSIS — N189 Chronic kidney disease, unspecified: Secondary | ICD-10-CM

## 2020-02-07 DIAGNOSIS — N400 Enlarged prostate without lower urinary tract symptoms: Secondary | ICD-10-CM | POA: Diagnosis present

## 2020-02-07 DIAGNOSIS — G4733 Obstructive sleep apnea (adult) (pediatric): Secondary | ICD-10-CM | POA: Diagnosis present

## 2020-02-07 DIAGNOSIS — Z8673 Personal history of transient ischemic attack (TIA), and cerebral infarction without residual deficits: Secondary | ICD-10-CM

## 2020-02-07 DIAGNOSIS — Z79890 Hormone replacement therapy (postmenopausal): Secondary | ICD-10-CM

## 2020-02-07 DIAGNOSIS — R432 Parageusia: Secondary | ICD-10-CM | POA: Diagnosis present

## 2020-02-07 DIAGNOSIS — K760 Fatty (change of) liver, not elsewhere classified: Secondary | ICD-10-CM | POA: Diagnosis present

## 2020-02-07 DIAGNOSIS — Z951 Presence of aortocoronary bypass graft: Secondary | ICD-10-CM

## 2020-02-07 DIAGNOSIS — I10 Essential (primary) hypertension: Secondary | ICD-10-CM | POA: Diagnosis present

## 2020-02-07 DIAGNOSIS — Z87891 Personal history of nicotine dependence: Secondary | ICD-10-CM

## 2020-02-07 LAB — CBC WITH DIFFERENTIAL/PLATELET
Abs Immature Granulocytes: 0.01 10*3/uL (ref 0.00–0.07)
Basophils Absolute: 0 10*3/uL (ref 0.0–0.1)
Basophils Relative: 1 %
Eosinophils Absolute: 0.1 10*3/uL (ref 0.0–0.5)
Eosinophils Relative: 1 %
HCT: 29.8 % — ABNORMAL LOW (ref 39.0–52.0)
Hemoglobin: 9.7 g/dL — ABNORMAL LOW (ref 13.0–17.0)
Immature Granulocytes: 0 %
Lymphocytes Relative: 30 %
Lymphs Abs: 1.4 10*3/uL (ref 0.7–4.0)
MCH: 30.6 pg (ref 26.0–34.0)
MCHC: 32.6 g/dL (ref 30.0–36.0)
MCV: 94 fL (ref 80.0–100.0)
Monocytes Absolute: 0.4 10*3/uL (ref 0.1–1.0)
Monocytes Relative: 9 %
Neutro Abs: 2.6 10*3/uL (ref 1.7–7.7)
Neutrophils Relative %: 59 %
Platelets: 134 10*3/uL — ABNORMAL LOW (ref 150–400)
RBC: 3.17 MIL/uL — ABNORMAL LOW (ref 4.22–5.81)
RDW: 14.2 % (ref 11.5–15.5)
WBC: 4.5 10*3/uL (ref 4.0–10.5)
nRBC: 0 % (ref 0.0–0.2)

## 2020-02-07 LAB — COMPREHENSIVE METABOLIC PANEL
ALT: 15 U/L (ref 0–44)
AST: 13 U/L — ABNORMAL LOW (ref 15–41)
Albumin: 3.9 g/dL (ref 3.5–5.0)
Alkaline Phosphatase: 62 U/L (ref 38–126)
Anion gap: 11 (ref 5–15)
BUN: 124 mg/dL — ABNORMAL HIGH (ref 8–23)
CO2: 17 mmol/L — ABNORMAL LOW (ref 22–32)
Calcium: 9 mg/dL (ref 8.9–10.3)
Chloride: 107 mmol/L (ref 98–111)
Creatinine, Ser: 5.25 mg/dL — ABNORMAL HIGH (ref 0.61–1.24)
GFR calc Af Amer: 12 mL/min — ABNORMAL LOW (ref >60)
GFR calc non Af Amer: 10 mL/min — ABNORMAL LOW (ref >60)
Glucose, Bld: 109 mg/dL — ABNORMAL HIGH (ref 70–99)
Potassium: 4.3 mmol/L (ref 3.5–5.1)
Sodium: 135 mmol/L (ref 135–145)
Total Bilirubin: 0.8 mg/dL (ref 0.3–1.2)
Total Protein: 6.8 g/dL (ref 6.5–8.1)

## 2020-02-07 LAB — RESPIRATORY PANEL BY RT PCR (FLU A&B, COVID)
Influenza A by PCR: NEGATIVE
Influenza B by PCR: NEGATIVE
SARS Coronavirus 2 by RT PCR: NEGATIVE

## 2020-02-07 LAB — URINALYSIS, ROUTINE W REFLEX MICROSCOPIC
Bacteria, UA: NONE SEEN
Bilirubin Urine: NEGATIVE
Glucose, UA: NEGATIVE mg/dL
Hgb urine dipstick: NEGATIVE
Ketones, ur: NEGATIVE mg/dL
Leukocytes,Ua: NEGATIVE
Nitrite: NEGATIVE
Protein, ur: 100 mg/dL — AB
Specific Gravity, Urine: 1.011 (ref 1.005–1.030)
pH: 5 (ref 5.0–8.0)

## 2020-02-07 LAB — LIPASE, BLOOD: Lipase: 69 U/L — ABNORMAL HIGH (ref 11–51)

## 2020-02-07 LAB — LIPID PANEL
Cholesterol: 136 mg/dL (ref 0–200)
HDL: 21 mg/dL — ABNORMAL LOW (ref >40)
LDL Cholesterol: 88 mg/dL (ref 0–99)
Total CHOL/HDL Ratio: 6.5 RATIO
Triglycerides: 136 mg/dL (ref <150)
VLDL: 27 mg/dL (ref 0–40)

## 2020-02-07 LAB — PARATHYROID HORMONE, INTACT (NO CA): PTH: 37 pg/mL (ref 15–65)

## 2020-02-07 LAB — LACTIC ACID, PLASMA: Lactic Acid, Venous: 0.8 mmol/L (ref 0.5–1.9)

## 2020-02-07 LAB — TROPONIN I (HIGH SENSITIVITY): Troponin I (High Sensitivity): 31 ng/L — ABNORMAL HIGH (ref <18)

## 2020-02-07 MED ORDER — OMEGA-3-ACID ETHYL ESTERS 1 G PO CAPS
1000.0000 mg | ORAL_CAPSULE | Freq: Two times a day (BID) | ORAL | Status: DC
  Administered 2020-02-07 – 2020-02-11 (×8): 1000 mg via ORAL
  Filled 2020-02-07 (×8): qty 1

## 2020-02-07 MED ORDER — ATORVASTATIN CALCIUM 80 MG PO TABS
80.0000 mg | ORAL_TABLET | Freq: Every day | ORAL | Status: DC
  Administered 2020-02-08 – 2020-02-11 (×4): 80 mg via ORAL
  Filled 2020-02-07 (×4): qty 1

## 2020-02-07 MED ORDER — DARBEPOETIN ALFA 25 MCG/0.42ML IJ SOSY
25.0000 ug | PREFILLED_SYRINGE | INTRAMUSCULAR | Status: DC
  Administered 2020-02-08: 25 ug via SUBCUTANEOUS
  Filled 2020-02-07: qty 0.42

## 2020-02-07 MED ORDER — ISOSORBIDE MONONITRATE ER 60 MG PO TB24
120.0000 mg | ORAL_TABLET | Freq: Every day | ORAL | Status: DC
  Administered 2020-02-08 – 2020-02-11 (×4): 120 mg via ORAL
  Filled 2020-02-07 (×4): qty 2

## 2020-02-07 MED ORDER — SODIUM CHLORIDE 0.9% FLUSH
3.0000 mL | Freq: Two times a day (BID) | INTRAVENOUS | Status: DC
  Administered 2020-02-07: 3 mL via INTRAVENOUS

## 2020-02-07 MED ORDER — HEPARIN SODIUM (PORCINE) 5000 UNIT/ML IJ SOLN
5000.0000 [IU] | Freq: Three times a day (TID) | INTRAMUSCULAR | Status: DC
  Administered 2020-02-07 – 2020-02-11 (×10): 5000 [IU] via SUBCUTANEOUS
  Filled 2020-02-07 (×11): qty 1

## 2020-02-07 MED ORDER — SODIUM CHLORIDE 0.9 % IV SOLN: INTRAVENOUS | Status: AC

## 2020-02-07 MED ORDER — ONDANSETRON HCL 4 MG/2ML IJ SOLN
4.0000 mg | Freq: Four times a day (QID) | INTRAMUSCULAR | Status: DC | PRN
  Administered 2020-02-09: 4 mg via INTRAVENOUS
  Filled 2020-02-07: qty 2

## 2020-02-07 MED ORDER — FOLIC ACID 1 MG PO TABS
1.0000 mg | ORAL_TABLET | Freq: Every day | ORAL | Status: DC
  Administered 2020-02-08 – 2020-02-11 (×4): 1 mg via ORAL
  Filled 2020-02-07 (×4): qty 1

## 2020-02-07 MED ORDER — HYDRALAZINE HCL 50 MG PO TABS
100.0000 mg | ORAL_TABLET | Freq: Three times a day (TID) | ORAL | Status: DC
  Administered 2020-02-07 – 2020-02-11 (×12): 100 mg via ORAL
  Filled 2020-02-07 (×12): qty 2

## 2020-02-07 MED ORDER — FAMOTIDINE 20 MG PO TABS
20.0000 mg | ORAL_TABLET | Freq: Every day | ORAL | Status: DC
  Administered 2020-02-08 – 2020-02-11 (×4): 20 mg via ORAL
  Filled 2020-02-07 (×5): qty 1

## 2020-02-07 MED ORDER — ACETAMINOPHEN 325 MG PO TABS: 650.0000 mg | ORAL_TABLET | Freq: Four times a day (QID) | ORAL | Status: DC | PRN

## 2020-02-07 MED ORDER — SODIUM BICARBONATE 650 MG PO TABS
650.0000 mg | ORAL_TABLET | Freq: Two times a day (BID) | ORAL | Status: DC
  Administered 2020-02-07 – 2020-02-11 (×8): 650 mg via ORAL
  Filled 2020-02-07 (×7): qty 1

## 2020-02-07 MED ORDER — TAMSULOSIN HCL 0.4 MG PO CAPS
0.4000 mg | ORAL_CAPSULE | Freq: Every day | ORAL | Status: DC
  Administered 2020-02-08 – 2020-02-11 (×4): 0.4 mg via ORAL
  Filled 2020-02-07 (×4): qty 1

## 2020-02-07 MED ORDER — CLOPIDOGREL BISULFATE 75 MG PO TABS
75.0000 mg | ORAL_TABLET | Freq: Every day | ORAL | Status: DC
  Administered 2020-02-08 – 2020-02-11 (×4): 75 mg via ORAL
  Filled 2020-02-07 (×4): qty 1

## 2020-02-07 MED ORDER — IOHEXOL 9 MG/ML PO SOLN
ORAL | Status: AC
  Filled 2020-02-07: qty 1000

## 2020-02-07 MED ORDER — LABETALOL HCL 5 MG/ML IV SOLN: 10.0000 mg | INTRAVENOUS | Status: DC | PRN

## 2020-02-07 MED ORDER — CARVEDILOL 6.25 MG PO TABS
6.2500 mg | ORAL_TABLET | Freq: Two times a day (BID) | ORAL | Status: DC
  Administered 2020-02-07 – 2020-02-11 (×6): 6.25 mg via ORAL
  Filled 2020-02-07 (×7): qty 1

## 2020-02-07 MED ORDER — LEVOTHYROXINE SODIUM 50 MCG PO TABS
50.0000 ug | ORAL_TABLET | Freq: Every day | ORAL | Status: DC
  Administered 2020-02-08 – 2020-02-11 (×4): 50 ug via ORAL
  Filled 2020-02-07 (×4): qty 1

## 2020-02-07 MED ORDER — SODIUM CHLORIDE 0.9 % IV SOLN: 250.0000 mL | INTRAVENOUS | Status: DC | PRN

## 2020-02-07 MED ORDER — VITAMIN D 25 MCG (1000 UNIT) PO TABS
1000.0000 [IU] | ORAL_TABLET | Freq: Every day | ORAL | Status: DC
  Administered 2020-02-08 – 2020-02-11 (×4): 1000 [IU] via ORAL
  Filled 2020-02-07 (×4): qty 1

## 2020-02-07 MED ORDER — AMLODIPINE BESYLATE 10 MG PO TABS
10.0000 mg | ORAL_TABLET | Freq: Every day | ORAL | Status: DC
  Administered 2020-02-08 – 2020-02-11 (×4): 10 mg via ORAL
  Filled 2020-02-07 (×4): qty 1

## 2020-02-07 MED ORDER — FINASTERIDE 5 MG PO TABS
5.0000 mg | ORAL_TABLET | Freq: Every day | ORAL | Status: DC
  Administered 2020-02-08 – 2020-02-11 (×4): 5 mg via ORAL
  Filled 2020-02-07 (×4): qty 1

## 2020-02-07 MED ORDER — POLYETHYLENE GLYCOL 3350 17 G PO PACK: 17.0000 g | PACK | Freq: Every day | ORAL | Status: DC | PRN

## 2020-02-07 MED ORDER — ALLOPURINOL 100 MG PO TABS
100.0000 mg | ORAL_TABLET | Freq: Every day | ORAL | Status: DC
  Administered 2020-02-08 – 2020-02-11 (×4): 100 mg via ORAL
  Filled 2020-02-07 (×4): qty 1

## 2020-02-07 MED ORDER — SODIUM CHLORIDE 0.9% FLUSH: 3.0000 mL | INTRAVENOUS | Status: DC | PRN

## 2020-02-07 MED ORDER — TRAZODONE HCL 50 MG PO TABS
50.0000 mg | ORAL_TABLET | Freq: Every evening | ORAL | Status: DC | PRN
  Administered 2020-02-07 – 2020-02-09 (×3): 50 mg via ORAL
  Filled 2020-02-07 (×3): qty 1

## 2020-02-07 NOTE — ED Triage Notes (Signed)
Pt sent by PCP due to vomiting daily for one month and weight loss. Pt denies pain. States he was told he had kidney failure and gallstones.

## 2020-02-07 NOTE — ED Notes (Signed)
Report given to [facility]  

## 2020-02-07 NOTE — Progress Notes (Signed)
NEW ADMISSION NOTE New Admission Note:   Arrival Method: Stretcher Mental Orientation: A&O X4 Telemetry: 5M15 Assessment: Completed Skin: See Flowsheets IV: WDL  Pain: Denies Tubes: N/A Safety Measures: Safety Fall Prevention Plan has been given, discussed and signed Admission: Completed [facility] Orientation: Patient has been orientated to the room, unit and staff.   Orders have been reviewed and implemented. Will continue to monitor the patient. Call light has been placed within reach and bed alarm has been activated.   Aneta Mins BSN, RN3

## 2020-02-07 NOTE — Clinical Social Work Note (Signed)
CSW completed [facility] Emergency Notification. Notification ID is: [id number].

## 2020-02-07 NOTE — ED Notes (Signed)
Patient transported to CT 

## 2020-02-07 NOTE — H&P (Signed)
Patient Demographics:    [name], is a 74 y.o. male  MRN: [id number]   DOB - 02/27/1946  Admit Date - 02/07/2020  Outpatient Primary MD for the patient is Clinic, [place]:    Principal Problem:   Uremia in the setting of AKI on CKD V Active Problems:   (HFpEF) heart failure with preserved ejection fraction (HCC)   AKI (acute kidney injury) on CKD V   Essential hypertension   Obstructive sleep apnea   S/P CABG x 2   CKD (chronic kidney disease), stage V ([place])   1)AKI on CKD V --- Versus CKD progression -Favor the former over the later -?? Uremia with persistent nausea and vomiting -Suspect worsening renal function is due to dehydration/volume depletion in the setting of persistent nausea vomiting and poor oral intake, compounded by Lasix and losartan use Creatinine was 3.63 on 12/24/2019, creatinine was up to 4.14 on 12/26/2019,  --on 02/06/2020 creatinine was up to 5.06 --Creatinine this admission on 02/07/2020 is 5.25 with a GFR of 10 and bicarb of 17 -PTH is 37, vitamin D is  25 - renally adjust medications, avoid nephrotoxic agents / dehydration  / hypotension -Discussed with Dr. Candiss Norse from nephrology service, patient may need initiation of HD this admission -Transfer to [facility] as patient will not be able to get HD access placed over the weekend at [facility] if HD is required sooner, rather than later -Recommend IV fluids at this time and discontinuation of Lasix -Also discontinue losartan due to worsening renal function and dehydration -Patient continues to have fair urine output at this time -Monitor input/output and daily weight ----UA with proteinuria otherwise unremarkable  2) chronic anemia of CKD--- -Hemoglobin is 9.7 which is close to patient's  recent baseline, Procrit currently not indicated -Recent anemia work-up with serum iron of 141 -No bleeding concerns -Continue ESA agent/Procrit per protocol  3)HFpEF--patient with chronic diastolic dysfunction CHF -Echo from January 2021 revealed echo with EF down to 60 to 65% from65 to 55% with diastolic dysfunction -Patient is currently dehydrated/volume depleted -Lasix has been discontinued -Judicious IV fluids initiated --Symptomatically much improved on IV Lasix,  4)CAD-prior CABG in 2008,LHC in 2015 with angioplasty and stenting--- -Troponin is 31 in the setting of worsening renal function--- patient is chest pain-free currently  -Continue Lipitor, Plavix, Coreg and isosorbide  5)Hypothyroidism--- stable, continue Levothyroxine 50 mcg daily  6)HTN--okay to restart amlodipine 10 mg daily, along with hydralazine 100 mg 3 times daily, isosorbide 120 mg daily and Coreg 6.25 mg twice daily, losartan and Lasix discontinued due to worsening renal function -May use IV labetalol as needed elevated BP  7)BPH--stable, continue Flomax  8) cholelithiasis without cholecystitis--- abdominal ultrasound and CT abdomen and pelvis noted -LFTs are not elevated -Borderline elevation of lipase noted, doubt clinically significant pancreatitis  9)10.6 CM-left renal cyst, nephrology consult pending, may need outpatient imaging surveillance/follow-up  10) fatty  liver----fasting lipid profile pending, continue Lipitor for CAD as above #4  Disposition/Need for in-Hospital Stay- patient unable to be discharged at this time due to --Transfer to [facility] as patient will not be able to get HD access placed over the weekend at [facility] if HD is required sooner, rather than later  Status is: Inpatient  Remains inpatient appropriate because:IV treatments appropriate due to intensity of illness or inability to take PO and Worsening renal function requiring IV hydration, may need  initiation of hemodialysis   Dispo: The patient is from: Home              Anticipated d/c is to: Home              Anticipated d/c date is: > 3 days              Patient currently is not medically stable to d/c. Barriers: Not Clinically Stable- Worsening renal function requiring IV hydration, may need initiation of hemodialysis   With History of - Reviewed by me  Past Medical History:  Diagnosis Date  . Acute myocardial infarction of other inferior wall, initial episode of care   . Anginal pain ([place])   . Anxiety   . Arthritis   . Cancer [facility])    bladder 2013 removed, has cystoscopy 10/2016, has returned x 2  . COPD (chronic obstructive pulmonary disease) ([place])   . Coronary artery disease    Artery bypass graft Jan 2008  . Dyspnea   . Dysrhythmia   . GERD (gastroesophageal reflux disease)   . Heart murmur   . History of hiatal hernia   . Hyperlipidemia   . Hypertension   . Hypothyroidism   . PONV (postoperative nausea and vomiting)   . Sleep apnea   . Stroke ([place])   . Tobacco user       Past Surgical History:  Procedure Laterality Date  . APPENDECTOMY    . CARDIAC CATHETERIZATION    . CORONARY ARTERY BYPASS GRAFT  06/14/2006   x 5  . CORONARY ARTERY BYPASS GRAFT N/A 09/19/2016   Procedure: REDO CORONARY ARTERY BYPASS GRAFTING (CABG) x two, using right internal mammary artery and left radial artery;  Surgeon: [name], MD;  Location: [place];  Service: Open Heart Surgery;  Laterality: N/A;  . FOOT SURGERY    . HERNIA REPAIR    . LEFT HEART CATH AND CORS/GRAFTS ANGIOGRAPHY N/A 09/14/2016   Procedure: Left Heart Cath and Cors/Grafts Angiography;  Surgeon: Troy Sine, MD;  Location: [place] CV LAB;  Service: Cardiovascular;  Laterality: N/A;  . LEFT HEART CATHETERIZATION WITH CORONARY/GRAFT ANGIOGRAM N/A 01/31/2014   Procedure: LEFT HEART CATHETERIZATION WITH Beatrix Fetters;  Surgeon: Sanda Klein, MD;  Location: [place] CATH LAB;  Service: Cardiovascular;   Laterality: N/A;  . NOSE SURGERY    . PERCUTANEOUS CORONARY STENT INTERVENTION (PCI-S)  01/31/2014   Procedure: PERCUTANEOUS CORONARY STENT INTERVENTION (PCI-S);  Surgeon: Sanda Klein, MD;  Location: [facility] C[facility] LAB;  Service: Cardiovascular;;  . RADIAL ARTERY HARVEST Left 09/19/2016   Procedure: RADIAL ARTERY HARVEST;  Surgeon: [name], MD;  Location: [place];  Service: Open Heart Surgery;  Laterality: Left;  . TEE WITHOUT CARDIOVERSION N/A 09/19/2016   Procedure: TRANSESOPHAGEAL ECHOCARDIOGRAM (TEE);  Surgeon: [name], MD;  Location: [place];  Service: Open Heart Surgery;  Laterality: N/A;    Chief Complaint  Patient presents with  . Emesis  . Weight Loss  HPI:    Maximum Reiland  is a 74 y.o. male with past medical history relevant for CAD (s/p prior CABG), COPD, HTN, BPH and CKD V as well as chronic anemia of CKD who presents with Nausea and  Vomiting, but w/o Diarrhea, pt did have a couple of loose stools in the ED after oral contrast -- -Emesis is without blood or bile, No fever  Or chills  --Chest pains no palpitations no dizziness -Creatinine was 3.63 on 12/24/2019, creatinine was up to 4.14 on 12/26/2019, on 02/06/2020 creatinine was up to 5.06 --Creatinine this admission on 02/07/2020 is 5.25 with a GFR of 10 and bicarb of 17 -Hemoglobin is 9.7 which is close to patient's recent baseline, Procrit currently not indicated - Abdominal ultrasound with cholelithiasis without sonographic evidence of acute cholecystitis -LFTs are not elevated -CT abdomen the pelvis without acute findings however patient does have a solitary gallstone, 10.6 cm left kidney cyst --UA with proteinuria otherwise unremarkable -Troponin is 31 in the setting of worsening renal function--- patient is chest pain-free    Review of systems:    In addition to the HPI above,   A full Review of  Systems was done, all other systems reviewed are negative except as noted above in HPI , .    Social  History:  Reviewed by me    Social History   Tobacco Use  . Smoking status: Former Smoker    Packs/day: 1.00    Years: 40.00    Pack years: 40.00    Types: Cigarettes    Start date: 02/20/1959    Quit date: 09/13/2016    Years since quitting: 3.4  . Smokeless tobacco: Never Used  . Tobacco comment: 10/7 2-8 cigarettes per day. cutting back- AJ  Substance Use Topics  . Alcohol use: No    Alcohol/week: 0.0 standard drinks    Comment: "No, not really"      Family History :  Reviewed by me    Family History  Problem Relation Age of Onset  . Heart attack Mother   . Heart attack Brother      Home Medications:   Prior to Admission medications   Medication Sig Start Date End Date Taking? Authorizing Provider  acetaminophen (TYLENOL) 325 MG tablet Take 2 tablets (650 mg total) by mouth every 6 (six) hours as needed for mild pain. 09/23/16  Yes Lars Pinks M, PA-C  allopurinol (ZYLOPRIM) 100 MG tablet Take 100 mg by mouth daily.  05/03/19  Yes [provider]  amLODipine (NORVASC) 10 MG tablet Take 1 tablet (10 mg total) by mouth daily. 05/18/19  Yes Seville Downs, MD  atorvastatin (LIPITOR) 80 MG tablet Take 80 mg by mouth daily.   Yes [provider]  clopidogrel (PLAVIX) 75 MG tablet Take 75 mg by mouth daily.   Yes [provider]  epoetin alfa (EPOGEN) 10000 UNIT/ML injection Inject 10,000 Units into the vein every 14 (fourteen) days.    Yes [provider]  famotidine (PEPCID) 40 MG tablet Take by mouth.   Yes [provider]  finasteride (PROSCAR) 5 MG tablet Take 5 mg by mouth daily.   Yes [provider]  folic acid (FOLVITE) 1 MG tablet Take by mouth.   Yes [provider]  furosemide (LASIX) 40 MG tablet Take 1.5 tablets (60 mg total) by mouth 2 (two) times daily. Patient taking differently: Take 20 mg by mouth 2 (two) times daily.  05/18/19  Yes Roxan Hockey, MD  hydrALAZINE (APRESOLINE) 100 MG  tablet Take 1 tablet (100 mg total) by mouth 3 (three) times daily. 05/18/19  Yes Roxan Hockey, MD  isosorbide mononitrate (IMDUR) 120 MG 24 hr tablet Take 1 tablet (120 mg total) by mouth daily. 05/18/19  Yes Latitia Housewright, MD  levothyroxine (SYNTHROID, LEVOTHROID) 50 MCG tablet Take 50 mcg by mouth daily before breakfast.   Yes [provider]  Omega-3 Fatty Acids (RA FISH OIL) 1000 MG CAPS Take 2,000 mg by mouth 2 (two) times daily.    Yes [provider]  tamsulosin (FLOMAX) 0.4 MG CAPS capsule Take 0.4 mg by mouth daily.   Yes [provider]  Vitamin D, Cholecalciferol, 25 MCG (1000 UT) CAPS Take 1 capsule by mouth daily.    Yes [provider]  carvedilol (COREG) 6.25 MG tablet Take 1 tablet (6.25 mg total) by mouth 2 (two) times daily. 05/18/19 05/17/20  Roxan Hockey, MD  losartan (COZAAR) 25 MG tablet Take 1 tablet (25 mg total) by mouth daily. Start on Monday 05/19/2018 Patient not taking: Reported on 02/07/2020 05/18/19   Roxan Hockey, MD  nitroGLYCERIN (NITROSTAT) 0.4 MG SL tablet Place 0.4 mg under the tongue every 5 (five) minutes as needed for chest pain.    [provider]    Allergies:     Allergies  Allergen Reactions  . Cardizem Cd [Diltiazem Hcl Er Beads] Palpitations    "Makes heart skip"     Physical Exam:   Vitals  Blood pressure (!) 121/52, pulse (!) 58, temperature 97.8 F (36.6 C), temperature source Oral, resp. rate 20, height 5\' 9"  (1.753 m), weight 87.1 kg, SpO2 97 %.  Physical Examination: General appearance - alert,  and in no distress  Mental status - alert, oriented to person, place, and time,  Eyes - sclera anicteric Neck - supple, no JVD elevation , Chest - clear  to auscultation bilaterally, symmetrical air movement,  Heart - S1 and S2 normal, regular , CABG scar Abdomen - soft, mild epigastric and upper quadrant tenderness without rebound or guarding,, nondistended,  Neurological - screening mental  status exam normal, neck supple without rigidity, cranial nerves II through XII intact, DTR's normal and symmetric Extremities - no pedal edema noted, intact peripheral pulses  Skin - warm, dry     Data Review:    CBC Recent Labs  Lab 02/06/20 1314 02/06/20 1322 02/07/20 1107  WBC 5.0  --  4.5  HGB 9.8* 9.9* 9.7*  HCT 30.3*  --  29.8*  PLT 143*  --  134*  MCV 93.8  --  94.0  MCH 30.3  --  30.6  MCHC 32.3  --  32.6  RDW 14.3  --  14.2  LYMPHSABS  --   --  1.4  MONOABS  --   --  0.4  EOSABS  --   --  0.1  BASOSABS  --   --  0.0   ------------------------------------------------------------------------------------------------------------------  Chemistries  Recent Labs  Lab 02/06/20 1314 02/07/20 1107  NA 135 135  K 4.3 4.3  CL 107 107  CO2 17* 17*  GLUCOSE 117* 109*  BUN 122* 124*  CREATININE 5.06* 5.25*  CALCIUM 9.0 9.0  AST  --  13*  ALT  --  15  ALKPHOS  --  62  BILITOT  --  0.8   ------------------------------------------------------------------------------------------------------------------ estimated creatinine clearance is 13.7 mL/min (A) (by C-G formula based on SCr of 5.25 mg/dL (H)). ------------------------------------------------------------------------------------------------------------------ No results for input(s): TSH, T4TOTAL, T3FREE,  THYROIDAB in the last 72 hours.  Invalid input(s): FREET3   Coagulation profile No results for input(s): INR, PROTIME in the last 168 hours. ------------------------------------------------------------------------------------------------------------------- No results for input(s): DDIMER in the last 72 hours. -------------------------------------------------------------------------------------------------------------------  Cardiac Enzymes No results for input(s): CKMB, TROPONINI, MYOGLOBIN in the last 168 hours.  Invalid input(s):  CK ------------------------------------------------------------------------------------------------------------------    Component Value Date/Time   BNP 666.0 (H) 05/16/2019 1010     ---------------------------------------------------------------------------------------------------------------  Urinalysis    Component Value Date/Time   COLORURINE YELLOW 02/07/2020 1214   APPEARANCEUR CLEAR 02/07/2020 1214   LABSPEC 1.011 02/07/2020 1214   PHURINE 5.0 02/07/2020 1214   GLUCOSEU NEGATIVE 02/07/2020 1214   [place] 02/07/2020 1214   BILIRUBINUR NEGATIVE 02/07/2020 1214   [place] 02/07/2020 1214   PROTEINUR 100 (A) 02/07/2020 1214   NITRITE NEGATIVE 02/07/2020 1214   LEUKOCYTESUR NEGATIVE 02/07/2020 1214    ----------------------------------------------------------------------------------------------------------------   Imaging Results:    CT ABDOMEN PELVIS WO CONTRAST  Result Date: 02/07/2020 CLINICAL DATA:  Acute generalized abdominal pain, weight loss. EXAM: CT ABDOMEN AND PELVIS WITHOUT CONTRAST TECHNIQUE: Multidetector CT imaging of the abdomen and pelvis was performed following the standard protocol without IV contrast. COMPARISON:  None. FINDINGS: Lower chest: No acute abnormality. Hepatobiliary: Solitary gallstone is noted. No biliary dilatation is noted. Liver is unremarkable on these unenhanced images. Pancreas: Unremarkable. No pancreatic ductal dilatation or surrounding inflammatory changes. Spleen: Normal in size without focal abnormality. 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 Left adrenal gland is unremarkable. 10.6 cm cyst is seen arising from lower pole of left kidney. Smaller cysts are seen involving both kidneys. No hydronephrosis or renal obstruction is noted. No renal or ureteral calculi are noted. Urinary bladder is decompressed. Stomach/Bowel: The stomach appears normal. Status post appendectomy. There is no evidence of  bowel obstruction or inflammation. Sigmoid diverticulosis is noted without inflammation. Vascular/Lymphatic: Aortic atherosclerosis. No enlarged abdominal or pelvic lymph nodes. Reproductive: Prostate is unremarkable. Other: No abdominal wall hernia or abnormality. No abdominopelvic ascites. Musculoskeletal: No acute or significant osseous findings. IMPRESSION: 1. Solitary gallstone. 2. 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 3. 10.6 cm left renal cyst. 4. Sigmoid diverticulosis without inflammation. 5. No acute abnormality seen in the abdomen or pelvis. 6. Aortic atherosclerosis. Aortic Atherosclerosis (ICD10-I70.0). Electronically Signed   By: Marijo Conception M.D.   On: 02/07/2020 14:49   US Abdomen Limited  Result Date: 02/07/2020 CLINICAL DATA:  Right upper quadrant pain EXAM: ULTRASOUND ABDOMEN LIMITED RIGHT UPPER QUADRANT COMPARISON:  None. FINDINGS: Gallbladder: Mobile 12 mm gallstone. No wall thickening or sonographic Murphy sign. Common bile duct: Diameter: Normal caliber, 4 mm. Liver: Increased echotexture compatible with fatty infiltration. No focal abnormality or biliary ductal dilatation. Portal vein is patent on color Doppler imaging with normal direction of blood flow towards the liver. Other: None. IMPRESSION: Cholelithiasis.  No sonographic evidence of acute cholecystitis. Diffuse fatty infiltration of the liver. Electronically Signed   By: Rolm Baptise M.D.   On: 02/07/2020 11:22   DG Chest Port 1 View  Result Date: 02/07/2020 CLINICAL DATA:  Chills, vomiting EXAM: PORTABLE CHEST 1 VIEW COMPARISON:  05/16/2019 FINDINGS: Prior CABG. Heart is normal size. Lungs are clear. No effusions. No acute bony abnormality. IMPRESSION: No active disease. Electronically Signed   By: Rolm Baptise M.D.   On: 02/07/2020 11:00    Radiological Exams on Admission: CT ABDOMEN PELVIS WO CONTRAST  Result Date: 02/07/2020 CLINICAL DATA:  Acute generalized abdominal pain, weight loss. EXAM: CT  ABDOMEN AND PELVIS  WITHOUT CONTRAST TECHNIQUE: Multidetector CT imaging of the abdomen and pelvis was performed following the standard protocol without IV contrast. COMPARISON:  None. FINDINGS: Lower chest: No acute abnormality. Hepatobiliary: Solitary gallstone is noted. No biliary dilatation is noted. Liver is unremarkable on these unenhanced images. Pancreas: Unremarkable. No pancreatic ductal dilatation or surrounding inflammatory changes. Spleen: Normal in size without focal abnormality. 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 Left adrenal gland is unremarkable. 10.6 cm cyst is seen arising from lower pole of left kidney. Smaller cysts are seen involving both kidneys. No hydronephrosis or renal obstruction is noted. No renal or ureteral calculi are noted. Urinary bladder is decompressed. Stomach/Bowel: The stomach appears normal. Status post appendectomy. There is no evidence of bowel obstruction or inflammation. Sigmoid diverticulosis is noted without inflammation. Vascular/Lymphatic: Aortic atherosclerosis. No enlarged abdominal or pelvic lymph nodes. Reproductive: Prostate is unremarkable. Other: No abdominal wall hernia or abnormality. No abdominopelvic ascites. Musculoskeletal: No acute or significant osseous findings. IMPRESSION: 1. Solitary gallstone. 2. 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 3. 10.6 cm left renal cyst. 4. Sigmoid diverticulosis without inflammation. 5. No acute abnormality seen in the abdomen or pelvis. 6. Aortic atherosclerosis. Aortic Atherosclerosis (ICD10-I70.0). Electronically Signed   By: Marijo Conception M.D.   On: 02/07/2020 14:49   US Abdomen Limited  Result Date: 02/07/2020 CLINICAL DATA:  Right upper quadrant pain EXAM: ULTRASOUND ABDOMEN LIMITED RIGHT UPPER QUADRANT COMPARISON:  None. FINDINGS: Gallbladder: Mobile 12 mm gallstone. No wall thickening or sonographic Murphy sign. Common bile duct: Diameter: Normal caliber, 4 mm. Liver: Increased echotexture compatible  with fatty infiltration. No focal abnormality or biliary ductal dilatation. Portal vein is patent on color Doppler imaging with normal direction of blood flow towards the liver. Other: None. IMPRESSION: Cholelithiasis.  No sonographic evidence of acute cholecystitis. Diffuse fatty infiltration of the liver. Electronically Signed   By: Rolm Baptise M.D.   On: 02/07/2020 11:22   DG Chest Port 1 View  Result Date: 02/07/2020 CLINICAL DATA:  Chills, vomiting EXAM: PORTABLE CHEST 1 VIEW COMPARISON:  05/16/2019 FINDINGS: Prior CABG. Heart is normal size. Lungs are clear. No effusions. No acute bony abnormality. IMPRESSION: No active disease. Electronically Signed   By: Rolm Baptise M.D.   On: 02/07/2020 11:00    DVT Prophylaxis -SCD/Heparin AM Labs Ordered, also please review Full Orders  Family Communication: Admission, patients condition and plan of care including tests being ordered have been discussed with the patient  who indicate understanding and agree with the plan   Code Status - Full Code  Likely DC to  Home when renal function improves and nausea vomiting abates and tolerating oral intake  Condition   stable  Roxan Hockey M.D on 02/07/2020 at 5:01 PM Go to [email or web address] -  for contact info  [facility] - Office  [phone]

## 2020-02-07 NOTE — Consult Note (Signed)
Reason for Consult: AKI/CKD stage IV Referring Physician: Avon Gully, MD  [name] is an 74 y.o. 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 Theador Hawthorne after his labs were notable for BUN of 122 and Scr of 5.06.  [name] has had persistent N/V for the past 5-6 weeks and was hospitalized at [facility] on 8/2-8/5 for hyperkalemia and AKI/CKD due to N/V and dehydration in setting of ARB therapy.  He was treated with IVF and antiemetics with improvement of his symptoms and Scr, however his N/V returned until he was evaluated today at [facility].  We were consulted to help further evaluate and manage his AKI/CKD and provide HD if needed.    [name] was referred to [facility] by Dr. Theador Hawthorne for access placement but was not scheduled to be seen until Monday, 02/10/20.  He was planning on home therapies when needed.   He reports the N/V has stopped today.  He denies any dysuria, pyuria, hematuria, urgency, frequency, or retention.  He does report dysgeusia and anorexia with weight loss.  He denies any chest pain, SOB, DOE, orthopnea, or PND.  He also normally receives EPO injections every 2 weeks at [facility].  Trend in Creatinine: Creatinine, Ser  Date/Time Value Ref Range Status  02/07/2020 11:07 AM 5.25 (H) 0.61 - 1.24 mg/dL Final  02/06/2020 01:14 PM 5.06 (H) 0.61 - 1.24 mg/dL Final  12/26/2019 01:00 PM 4.14 (H) 0.61 - 1.24 mg/dL Final  12/24/2019 12:53 PM 3.63 (H) 0.61 - 1.24 mg/dL Final  12/20/2019 11:01 AM 3.94 (H) 0.61 - 1.24 mg/dL Final  12/10/2019 03:27 PM 4.39 (H) 0.61 - 1.24 mg/dL Final  09/19/2019 10:06 AM 3.06 (H) 0.61 - 1.24 mg/dL Final  07/10/2019 10:18 AM 2.88 (H) 0.61 - 1.24  mg/dL Final  05/18/2019 05:14 AM 2.77 (H) 0.61 - 1.24 mg/dL Final  05/17/2019 06:01 AM 2.74 (H) 0.61 - 1.24 mg/dL Final  05/16/2019 10:09 AM 2.66 (H) 0.61 - 1.24 mg/dL Final  09/24/2016 02:51 AM 1.63 (H) 0.61 - 1.24 mg/dL Final  09/23/2016 03:13 AM 1.64 (H) 0.61 - 1.24 mg/dL Final  09/22/2016 06:00 AM 1.58 (H) 0.61 - 1.24 mg/dL Final  09/21/2016 02:50 AM 1.51 (H) 0.61 - 1.24 mg/dL Final  09/20/2016 04:27 PM 1.50 (H) 0.61 - 1.24 mg/dL Final  09/20/2016 04:23 PM 1.59 (H) 0.61 - 1.24 mg/dL Final  09/20/2016 02:47 AM 1.21 0.61 - 1.24 mg/dL Final  09/19/2016 09:04 PM 1.20 0.61 - 1.24 mg/dL Final  09/19/2016 09:03 PM 1.20 0.61 - 1.24 mg/dL Final  09/19/2016 02:12 PM 1.10 0.61 - 1.24 mg/dL Final  09/19/2016 01:02 PM 1.00 0.61 - 1.24 mg/dL Final  09/19/2016 12:19 PM 1.10 0.61 - 1.24 mg/dL Final  09/19/2016 10:48 AM 1.10 0.61 - 1.24 mg/dL Final  09/19/2016 10:07 AM 1.20 0.61 - 1.24 mg/dL Final  09/19/2016 09:42 AM 1.10 0.61 - 1.24 mg/dL Final  09/19/2016 08:16 AM 1.10 0.61 - 1.24 mg/dL Final  09/19/2016 04:59 AM 1.32 (H) 0.61 - 1.24 mg/dL Final  09/18/2016 02:03 AM 1.37 (H) 0.61 - 1.24 mg/dL Final  09/15/2016 04:16 AM 1.33 (H) 0.61 - 1.24 mg/dL Final  09/14/2016 03:38 AM 1.59 (H) 0.61 - 1.24 mg/dL Final  02/01/2014 03:50 AM 1.12 0.50 - 1.35 mg/dL Final  01/31/2014 04:57 AM 1.00 0.50 - 1.35 mg/dL Final  01/30/2014 08:20 PM 1.42 (H) 0.50 - 1.35 mg/dL Final  02/10/2010 08:39 AM 1.5 0.4 - 1.5 mg/dL Final  01/28/2009 07:53 AM 1.1 0.4 - 1.5 mg/dL Final  04/23/2008 09:26 AM 1.1 0.4 - 1.5 mg/dL Final    PMH:   Past Medical History:  Diagnosis Date  . Acute myocardial infarction of other inferior wall, initial episode of care   . Anginal pain ([place])   . Anxiety   . Arthritis   . Cancer [facility])    bladder 2013 removed, has cystoscopy 10/2016, has returned x 2  . COPD (chronic obstructive pulmonary disease) ([place])   . Coronary artery disease    Artery bypass graft Jan 2008  . Dyspnea   .  Dysrhythmia   . GERD (gastroesophageal reflux disease)   . Heart murmur   . History of hiatal hernia   . Hyperlipidemia   . Hypertension   . Hypothyroidism   . PONV (postoperative nausea and vomiting)   . Sleep apnea   . Stroke ([place])   . Tobacco user     PSH:   Past Surgical History:  Procedure Laterality Date  . APPENDECTOMY    . CARDIAC CATHETERIZATION    . CORONARY ARTERY BYPASS GRAFT  06/14/2006   x 5  . CORONARY ARTERY BYPASS GRAFT N/A 09/19/2016   Procedure: REDO CORONARY ARTERY BYPASS GRAFTING (CABG) x two, using right internal mammary artery and left radial artery;  Surgeon: [name], MD;  Location: [place];  Service: Open Heart Surgery;  Laterality: N/A;  . FOOT SURGERY    . HERNIA REPAIR    . LEFT HEART CATH AND CORS/GRAFTS ANGIOGRAPHY N/A 09/14/2016   Procedure: Left Heart Cath and Cors/Grafts Angiography;  Surgeon: Troy Sine, MD;  Location: [place] CV LAB;  Service: Cardiovascular;  Laterality: N/A;  . LEFT HEART CATHETERIZATION WITH CORONARY/GRAFT ANGIOGRAM N/A 01/31/2014   Procedure: LEFT HEART CATHETERIZATION WITH Beatrix Fetters;  Surgeon: Sanda Klein, MD;  Location: [place] CATH LAB;  Service: Cardiovascular;  Laterality: N/A;  . NOSE SURGERY    . PERCUTANEOUS CORONARY STENT INTERVENTION (PCI-S)  01/31/2014   Procedure: PERCUTANEOUS CORONARY STENT INTERVENTION (PCI-S);  Surgeon: Sanda Klein, MD;  Location: [facility] CATH LAB;  Service: Cardiovascular;;  . RADIAL ARTERY HARVEST Left 09/19/2016   Procedure: RADIAL ARTERY HARVEST;  Surgeon: [name], MD;  Location: [place];  Service: Open Heart Surgery;  Laterality: Left;  . TEE WITHOUT CARDIOVERSION N/A 09/19/2016   Procedure: TRANSESOPHAGEAL ECHOCARDIOGRAM (TEE);  Surgeon: [name], MD;  Location: [facility];  Service: Open Heart Surgery;  Laterality: N/A;    Allergies:  Allergies  Allergen Reactions  . Cardizem Cd [Diltiazem Hcl Er Beads] Palpitations    "Makes heart skip"    Medications:    Prior to Admission medications   Medication Sig Start Date End Date Taking? Authorizing Provider  acetaminophen (TYLENOL) 325 MG tablet Take 2 tablets (650 mg total) by mouth every 6 (six) hours as needed for mild pain. 09/23/16  Yes Lars Pinks M, PA-C  allopurinol (ZYLOPRIM) 100 MG tablet Take 100 mg by mouth daily.  05/03/19  Yes [provider]  amLODipine (NORVASC) 10 MG tablet Take 1 tablet (10 mg total) by mouth daily. 05/18/19  Yes Emokpae, Courage, MD  atorvastatin (LIPITOR) 80 MG tablet Take 80 mg by mouth daily.   Yes [provider]  clopidogrel (PLAVIX) 75 MG tablet Take 75 mg by mouth daily.   Yes [provider]  epoetin alfa (EPOGEN) 10000 UNIT/ML injection Inject 10,000 Units into the vein every 14 (fourteen) days.    Yes [provider]  famotidine (PEPCID) 40 MG tablet Take by mouth.   Yes [provider]  finasteride (PROSCAR) 5 MG tablet Take 5 mg by mouth daily.   Yes [provider]  folic acid (FOLVITE) 1 MG tablet Take by mouth.   Yes [provider]  furosemide (LASIX) 40 MG tablet Take 1.5 tablets (60 mg total) by mouth 2 (two) times daily. Patient taking differently: Take 20 mg by mouth 2 (two) times daily.  05/18/19  Yes Emokpae, Courage, MD  hydrALAZINE (APRESOLINE) 100 MG tablet Take 1 tablet (100 mg total) by mouth 3 (three) times daily. 05/18/19  Yes Roxan Hockey, MD  isosorbide mononitrate (IMDUR) 120 MG 24 hr tablet Take 1 tablet (120 mg total) by mouth daily. 05/18/19  Yes Emokpae, Courage, MD  levothyroxine (SYNTHROID, LEVOTHROID) 50 MCG tablet Take 50 mcg by mouth daily before breakfast.   Yes [provider]  Omega-3 Fatty Acids (RA FISH OIL) 1000 MG CAPS Take 2,000 mg by mouth 2 (two) times daily.    Yes [provider]  tamsulosin (FLOMAX) 0.4 MG CAPS capsule Take 0.4 mg by mouth daily.   Yes [provider]  Vitamin D, Cholecalciferol, 25 MCG (1000 UT) CAPS Take 1  capsule by mouth daily.    Yes [provider]  carvedilol (COREG) 6.25 MG tablet Take 1 tablet (6.25 mg total) by mouth 2 (two) times daily. 05/18/19 05/17/20  Roxan Hockey, MD  losartan (COZAAR) 25 MG tablet Take 1 tablet (25 mg total) by mouth daily. Start on Monday 05/19/2018 Patient not taking: Reported on 02/07/2020 05/18/19   Roxan Hockey, MD  nitroGLYCERIN (NITROSTAT) 0.4 MG SL tablet Place 0.4 mg under the tongue every 5 (five) minutes as needed for chest pain.    [provider]    Inpatient medications: . allopurinol  100 mg Oral Daily  . amLODipine  10 mg Oral Daily  . atorvastatin  80 mg Oral Daily  . carvedilol  6.25 mg Oral BID  . [START ON 02/08/2020] cholecalciferol  1,000 Units Oral Daily  . [START ON 02/08/2020] clopidogrel  75 mg Oral Daily  . famotidine  20 mg Oral Daily  . finasteride  5 mg Oral Daily  . [START ON 5/32/9924] folic acid  1 mg Oral Daily  . heparin  5,000 Units Subcutaneous Q8H  . hydrALAZINE  100 mg Oral TID  . [START ON 02/08/2020] isosorbide mononitrate  120 mg Oral Daily  . [START ON 02/08/2020] levothyroxine  50 mcg Oral Q0600  . omega-3 acid ethyl esters  1,000 mg Oral BID  . sodium chloride flush  3 mL Intravenous Q12H  . [START ON 02/08/2020] tamsulosin  0.4 mg Oral Daily    Discontinued Meds:   Medications Discontinued During This Encounter  Medication Reason  . cyanocobalamin (,VITAMIN B-12,) 1000 MCG/ML injection Patient Preference  . ferrous sulfate 325 (65 FE) MG tablet Patient Preference  . omeprazole (PRILOSEC) 20 MG capsule Change in therapy  . magnesium oxide (MAG-OX) 400 MG tablet No longer needed (for PRN medications)    Social History:  reports that he quit smoking about 3 years ago. His smoking use included cigarettes. He started smoking about 61 years ago. He has a 40.00 pack-year smoking history. He has  never used smokeless tobacco. He reports that he does not drink alcohol and does not use drugs.  Family  History:   Family History  Problem Relation Age of Onset  . Heart attack Mother   . Heart attack Brother     Pertinent items are noted in HPI. Weight change:   Intake/Output Summary (Last 24 hours) at 02/07/2020 2006 Last data filed at 02/07/2020 1800 Gross per 24 hour  Intake 1000 ml  Output 0 ml  Net 1000 ml   BP (!) 152/56 (BP Location: Left Arm)   Pulse 65   Temp 98.3 F (36.8 C) (Oral)   Resp 20   Ht 5\' 9"  (1.753 m)   Wt 85.3 kg   SpO2 98%   BMI 27.77 kg/m  Vitals:   02/07/20 1615 02/07/20 1630 02/07/20 1645 02/07/20 1800  BP:    (!) 152/56  Pulse:    65  Resp: (!) 34 14 (!) 22 20  Temp:    98.3 F (36.8 C)  TempSrc:    Oral  SpO2:    98%  Weight:    85.3 kg  Height:    5\' 9"  (1.753 m)     General appearance: alert, cooperative and no distress Head: Normocephalic, without obvious abnormality, atraumatic Eyes: negative findings: lids and lashes normal, conjunctivae and sclerae normal and corneas clear Resp: clear to auscultation bilaterally Cardio: regular rate and rhythm, S1, S2 normal, no murmur, click, rub or gallop GI: hyperactive bowel sounds, soft, mildly tender in epigastric and upper quandrants without rebound or guarding Extremities: extremities normal, atraumatic, no cyanosis or edema Neuro:  AA&O x 3, no asterixis  Labs: Basic Metabolic Panel: Recent Labs  Lab 02/06/20 1314 02/07/20 1107  NA 135 135  K 4.3 4.3  CL 107 107  CO2 17* 17*  GLUCOSE 117* 109*  BUN 122* 124*  CREATININE 5.06* 5.25*  ALBUMIN 3.8 3.9  CALCIUM 9.0 9.0  PHOS 5.2*  --    Liver Function Tests: Recent Labs  Lab 02/06/20 1314 02/07/20 1107  AST  --  13*  ALT  --  15  ALKPHOS  --  62  BILITOT  --  0.8  PROT  --  6.8  ALBUMIN 3.8 3.9   Recent Labs  Lab 02/07/20 1107  LIPASE 69*   No results for input(s): AMMONIA in the last 168 hours. CBC: Recent Labs  Lab 02/06/20 1314 02/06/20 1322 02/07/20 1107  WBC 5.0  --  4.5  NEUTROABS  --   --  2.6  HGB  9.8* 9.9* 9.7*  HCT 30.3*  --  29.8*  MCV 93.8  --  94.0  PLT 143*  --  134*   PT/INR: @LABRCNTIP (inr:5) Cardiac Enzymes: )No results for input(s): CKTOTAL, CKMB, CKMBINDEX, TROPONINI in the last 168 hours. CBG: No results for input(s): GLUCAP in the last 168 hours.  Iron Studies:  Recent Labs  Lab 02/06/20 1314  IRON 141  TIBC 222*    Xrays/Other Studies: CT ABDOMEN PELVIS WO CONTRAST  Result Date: 02/07/2020 CLINICAL DATA:  Acute generalized abdominal pain, weight loss. EXAM: CT ABDOMEN AND PELVIS WITHOUT CONTRAST TECHNIQUE: Multidetector CT imaging of the abdomen and pelvis was performed following the standard protocol without IV contrast. COMPARISON:  None. FINDINGS: Lower chest: No acute abnormality. Hepatobiliary: Solitary gallstone is noted. No biliary dilatation is noted. Liver is unremarkable on these unenhanced images. Pancreas: Unremarkable. No pancreatic ductal dilatation or surrounding inflammatory changes. Spleen: Normal in size without focal abnormality. 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 Left adrenal gland is unremarkable. 10.6 cm cyst is seen arising from lower pole of left kidney. Smaller cysts are seen involving both kidneys. No hydronephrosis or renal obstruction is noted. No renal or ureteral calculi are noted. Urinary bladder is decompressed. Stomach/Bowel: The stomach appears normal. Status post appendectomy. There is no evidence of bowel obstruction or inflammation. Sigmoid diverticulosis is noted without inflammation. Vascular/Lymphatic: Aortic atherosclerosis. No enlarged abdominal or pelvic lymph nodes. Reproductive: Prostate is unremarkable. Other: No abdominal wall hernia or abnormality. No abdominopelvic ascites. Musculoskeletal: No acute or significant osseous findings. IMPRESSION: 1. Solitary gallstone. 2. 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 3. 10.6 cm left renal cyst. 4. Sigmoid diverticulosis without inflammation. 5. No acute  abnormality seen in the abdomen or pelvis. 6. Aortic atherosclerosis. Aortic Atherosclerosis (ICD10-I70.0). Electronically Signed   By: [name] M.D.   On: 02/07/2020 14:49   US Abdomen Limited  Result Date: 02/07/2020 CLINICAL DATA:  Right upper quadrant pain EXAM: ULTRASOUND ABDOMEN LIMITED RIGHT UPPER QUADRANT COMPARISON:  None. FINDINGS: Gallbladder: Mobile 12 mm gallstone. No wall thickening or sonographic Murphy sign. Common bile duct: Diameter: Normal caliber, 4 mm. Liver: Increased echotexture compatible with fatty infiltration. No focal abnormality or biliary ductal dilatation. Portal vein is patent on color Doppler imaging with normal direction of blood flow towards the liver. Other: None. IMPRESSION: Cholelithiasis.  No sonographic evidence of acute cholecystitis. Diffuse fatty infiltration of the liver. Electronically Signed   By: Rolm Baptise M.D.   On: 02/07/2020 11:22   DG Chest Port 1 View  Result Date: 02/07/2020 CLINICAL DATA:  Chills, vomiting EXAM: PORTABLE CHEST 1 VIEW COMPARISON:  05/16/2019 FINDINGS: Prior CABG. Heart is normal size. Lungs are clear. No effusions. No acute bony abnormality. IMPRESSION: No active disease. Electronically Signed   By: Rolm Baptise M.D.   On: 02/07/2020 11:00     Assessment/Plan: 1.  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  Presumably due to ischemic ATN in setting of protracted N/V and volume depletion.  Unclear if the N/V was due to progressive CKD or was the source of AKI.  No evidence of obstruction on abdominal CT scan.   1. Will resume IVF's and follow renal function and UOP 2. Will consult [facility] for access placement early this next week 3. Hopefully we will not need to start dialysis emergently and he will need TDC if BUN/Cr do not significantly improve with IVF's over the next 24-48 hours (unfortunately he came to [facility] too late for IR consult today) 4. No asterixis and nausea and vomiting have stopped but he still has dysgeusia and  anorexia. 2. Protracted N/V/D- currently improved.  Antiemetics prn per primary 3. Metabolic acidosis- start sodium bicarb 4. Anemia of CKD - will dose with Aranesp, iron stores at goal 5. Chronic diastolic CHF- currently hypovolemic 6. HTN- stable 7. Cholelithiasis without cholecystitis- solitary gallstone noted 8. Fatty liver 9. CAD- stable 10. BPH- on flomax   Donetta Potts 02/07/2020, 8:06 PM

## 2020-02-07 NOTE — ED Provider Notes (Signed)
[facility] EMERGENCY DEPARTMENT Provider Note   CSN: [id number] Arrival date & time: 02/07/20  5009     History Chief Complaint  Patient presents with  . Emesis  . Weight Loss    [name] is a 74 y.o. male.  HPI   This patient is a 74 year old male, history of prior COPD, coronary bypass grafting twice, history of acid reflux, hypertension and progressive kidney dysfunction.  I received a phone call from the patient's primary nephrologist who has been following along with [name] recently stating that his creatinine has gradually and persistently worsened however over the last 6 weeks he has had a fairly significant weight loss associated with nausea and vomiting that he occurs when he eats.  He has been Vance at the [facility] and found to have a gallstone, referred to general surgery but has not yet seen them.  When he followed up today with the clinic his lab work was reviewed which showed a progressive uremia with a BUN over one hundred and thirty and a creatinine that was creeping up above five.  It was recommended that he come to the hospital for admission, IV fluids and possible permacath placement, evaluation of right upper quadrant disease.  Additionally the patient has been having some chills and a source of infection is not known.  He has no coughing or shortness of breath.  Symptoms of been persistent, ongoing for 6 weeks, gradually worsening, not associated with diarrhea or urinary symptoms.  He does report that he has chronic swelling of his leg especially on the right but this is gone down over the last 6 weeks despite his lack of use of Lasix as he has not been drinking any fluids whatsoever  Past Medical History:  Diagnosis Date  . Acute myocardial infarction of other inferior wall, initial episode of care   . Anginal pain ([place])   . Anxiety   . Arthritis   . Cancer [facility])    bladder 2013 removed, has cystoscopy 10/2016, has returned x 2  . COPD  (chronic obstructive pulmonary disease) ([place])   . Coronary artery disease    Artery bypass graft Jan 2008  . Dyspnea   . Dysrhythmia   . GERD (gastroesophageal reflux disease)   . Heart murmur   . History of hiatal hernia   . Hyperlipidemia   . Hypertension   . Hypothyroidism   . PONV (postoperative nausea and vomiting)   . Sleep apnea   . Stroke ([place])   . Tobacco user     Patient Active Problem List   Diagnosis Date Noted  . (HFpEF) heart failure with preserved ejection fraction ([place]) 05/17/2019  . AKI (acute kidney injury) ([place]) 05/16/2019  . 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 NSTEMI (non-ST elevated myocardial infarction) ([place]) 09/14/2016  . Chest pain 09/14/2016  . Obstructive sleep apnea 09/14/2016  . Acute myocardial infarction of other inferior wall, initial episode of care   . Unstable angina ([place]) 01/30/2014  . OVERWEIGHT/OBESITY 02/02/2010  . HYPERLIPIDEMIA, MIXED 01/22/2009  . OTHER CHEST PAIN 01/22/2009  . TOBACCO USER 01/17/2009  . Essential hypertension 01/17/2009    Past Surgical History:  Procedure Laterality Date  . APPENDECTOMY    . CARDIAC CATHETERIZATION    . CORONARY ARTERY BYPASS GRAFT  06/14/2006   x 5  . CORONARY ARTERY BYPASS GRAFT N/A 09/19/2016   Procedure: REDO CORONARY ARTERY BYPASS GRAFTING (CABG) x two, using right internal mammary artery and left radial artery;  Surgeon: [name], MD;  Location: [place];  Service: Open Heart Surgery;  Laterality: N/A;  . FOOT SURGERY    . HERNIA REPAIR    . LEFT HEART CATH AND CORS/GRAFTS ANGIOGRAPHY N/A 09/14/2016   Procedure: Left Heart Cath and Cors/Grafts Angiography;  Surgeon: Troy Sine, MD;  Location: [place] CV LAB;  Service: Cardiovascular;  Laterality: N/A;  . LEFT HEART CATHETERIZATION WITH CORONARY/GRAFT ANGIOGRAM N/A 01/31/2014   Procedure: LEFT HEART CATHETERIZATION WITH Beatrix Fetters;  Surgeon: Sanda Klein, MD;  Location: [place] CATH LAB;  Service: Cardiovascular;  Laterality: N/A;   . NOSE SURGERY    . PERCUTANEOUS CORONARY STENT INTERVENTION (PCI-S)  01/31/2014   Procedure: PERCUTANEOUS CORONARY STENT INTERVENTION (PCI-S);  Surgeon: Sanda Klein, MD;  Location: [facility] CATH LAB;  Service: Cardiovascular;;  . RADIAL ARTERY HARVEST Left 09/19/2016   Procedure: RADIAL ARTERY HARVEST;  Surgeon: [name], MD;  Location: [place];  Service: Open Heart Surgery;  Laterality: Left;  . TEE WITHOUT CARDIOVERSION N/A 09/19/2016   Procedure: TRANSESOPHAGEAL ECHOCARDIOGRAM (TEE);  Surgeon: [name], MD;  Location: [place];  Service: Open Heart Surgery;  Laterality: N/A;       Family History  Problem Relation Age of Onset  . Heart attack Mother   . Heart attack Brother     Social History   Tobacco Use  . Smoking status: Former Smoker    Packs/day: 1.00    Years: 40.00    Pack years: 40.00    Types: Cigarettes    Start date: 02/20/1959    Quit date: 09/13/2016    Years since quitting: 3.4  . Smokeless tobacco: Never Used  . Tobacco comment: 10/7 2-8 cigarettes per day. cutting back- AJ  Vaping Use  . Vaping Use: Never used  Substance Use Topics  . Alcohol use: No    Alcohol/week: 0.0 standard drinks    Comment: "No, not really"  . Drug use: No    Home Medications Prior to Admission medications   Medication Sig Start Date End Date Taking? Authorizing Provider  acetaminophen (TYLENOL) 325 MG tablet Take 2 tablets (650 mg total) by mouth every 6 (six) hours as needed for mild pain. 09/23/16   Nani Skillern, PA-C  allopurinol (ZYLOPRIM) 100 MG tablet Take 100 mg by mouth 2 (two) times daily. 05/03/19   [provider]  amLODipine (NORVASC) 10 MG tablet Take 1 tablet (10 mg total) by mouth daily. 05/18/19   Roxan Hockey, MD  atorvastatin (LIPITOR) 80 MG tablet Take 80 mg by mouth daily.    [provider]  carvedilol (COREG) 6.25 MG tablet Take 1 tablet (6.25 mg total) by mouth 2 (two) times daily. 05/18/19 05/17/20  Roxan Hockey, MD   clopidogrel (PLAVIX) 75 MG tablet Take 75 mg by mouth daily.    [provider]  cyanocobalamin (,VITAMIN B-12,) 1000 MCG/ML injection Inject into the muscle.    [provider]  epoetin alfa (EPOGEN) 10000 UNIT/ML injection Inject 10,000 Units into the vein every 14 (fourteen) days.     [provider]  ferrous sulfate 325 (65 FE) MG tablet Take 325 mg by mouth 3 (three) times a week. MWF    [provider]  finasteride (PROSCAR) 5 MG tablet Take 5 mg by mouth daily.    [provider]  folic acid (FOLVITE) 1 MG tablet Take by mouth.    [provider]  furosemide (LASIX) 40 MG tablet Take 1.5 tablets (60 mg  total) by mouth 2 (two) times daily. 05/18/19   Roxan Hockey, MD  hydrALAZINE (APRESOLINE) 100 MG tablet Take 1 tablet (100 mg total) by mouth 3 (three) times daily. 05/18/19   Roxan Hockey, MD  isosorbide mononitrate (IMDUR) 120 MG 24 hr tablet Take 1 tablet (120 mg total) by mouth daily. 05/18/19   Roxan Hockey, MD  levothyroxine (SYNTHROID, LEVOTHROID) 50 MCG tablet Take 50 mcg by mouth daily before breakfast.    [provider]  losartan (COZAAR) 25 MG tablet Take 1 tablet (25 mg total) by mouth daily. Start on Monday 05/19/2018 05/18/19   Roxan Hockey, MD  magnesium oxide (MAG-OX) 400 MG tablet Take 420 mg by mouth daily.    [provider]  nitroGLYCERIN (NITROSTAT) 0.4 MG SL tablet Place 0.4 mg under the tongue every 5 (five) minutes as needed for chest pain.    [provider]  Omega-3 Fatty Acids (RA FISH OIL) 1000 MG CAPS Take 2,000 mg by mouth 2 (two) times daily.     [provider]  omeprazole (PRILOSEC) 20 MG capsule Take 40 mg by mouth 2 (two) times daily.     [provider]  tamsulosin (FLOMAX) 0.4 MG CAPS capsule Take 0.4 mg by mouth daily.    [provider]  Vitamin D, Cholecalciferol, 25 MCG (1000 UT) CAPS Take 1 capsule by mouth daily.     [provider]    Allergies    Cardizem cd [diltiazem hcl er beads]  Review of Systems   Review of Systems  All other systems reviewed and are negative.   Physical Exam Updated Vital Signs BP (!) 169/55 (BP Location: Right Arm)   Pulse 87   Temp 97.8 F (36.6 C) (Oral)   Resp (!) 22   Ht 1.753 m (5\' 9" )   Wt 87.1 kg   SpO2 97%   BMI 28.35 kg/m   Physical Exam Vitals and nursing note reviewed.  Constitutional:      General: He is not in acute distress.    Appearance: He is well-developed.  HENT:     Head: Normocephalic and atraumatic.     Mouth/Throat:     Pharynx: No oropharyngeal exudate.  Eyes:     General: No scleral icterus.       Right eye: No discharge.        Left eye: No discharge.     Conjunctiva/sclera: Conjunctivae normal.     Pupils: Pupils are equal, round, and reactive to light.  Neck:     Thyroid: No thyromegaly.     Vascular: No JVD.  Cardiovascular:     Rate and Rhythm: Normal rate and regular rhythm.     Heart sounds: Normal heart sounds. No murmur heard.  No friction rub. No gallop.   Pulmonary:     Effort: Pulmonary effort is normal. No respiratory distress.     Breath sounds: Normal breath sounds. No wheezing or rales.  Abdominal:     General: Bowel sounds are normal. There is no distension.     Palpations: Abdomen is soft. There is no mass.     Tenderness: There is abdominal tenderness.     Comments: Focal right upper quadrant tenderness with no Murphy sign, no other abdominal tenderness  Musculoskeletal:        General: No tenderness. Normal range of motion.     Cervical back: Normal range of motion and neck supple.  Lymphadenopathy:     Cervical: No cervical adenopathy.  Skin:    General: Skin is warm and dry.     Findings: No erythema or rash.  Neurological:     Mental Status: He is alert.     Coordination: Coordination normal.  Psychiatric:        Behavior: Behavior normal.     ED Results / Procedures / Treatments    Labs (all labs ordered are listed, but only abnormal results are displayed) Labs Reviewed  URINE CULTURE  CBC WITH DIFFERENTIAL/PLATELET  COMPREHENSIVE METABOLIC PANEL  LIPASE, BLOOD  LACTIC ACID, PLASMA  URINALYSIS, ROUTINE W REFLEX MICROSCOPIC  TROPONIN I (HIGH SENSITIVITY)    EKG None  Radiology US Abdomen Limited  Result Date: 02/07/2020 CLINICAL DATA:  Right upper quadrant pain EXAM: ULTRASOUND ABDOMEN LIMITED RIGHT UPPER QUADRANT COMPARISON:  None. FINDINGS: Gallbladder: Mobile 12 mm gallstone. No wall thickening or sonographic Murphy sign. Common bile duct: Diameter: Normal caliber, 4 mm. Liver: Increased echotexture compatible with fatty infiltration. No focal abnormality or biliary ductal dilatation. Portal vein is patent on color Doppler imaging with normal direction of blood flow towards the liver. Other: None. IMPRESSION: Cholelithiasis.  No sonographic evidence of acute cholecystitis. Diffuse fatty infiltration of the liver. Electronically Signed   By: Rolm Baptise M.D.   On: 02/07/2020 11:22   DG Chest Port 1 View  Result Date: 02/07/2020 CLINICAL DATA:  Chills, vomiting EXAM: PORTABLE CHEST 1 VIEW COMPARISON:  05/16/2019 FINDINGS: Prior CABG. Heart is normal size. Lungs are clear. No effusions. No acute bony abnormality. IMPRESSION: No active disease. Electronically Signed   By: Rolm Baptise M.D.   On: 02/07/2020 11:00    Procedures Procedures (including critical care time)  Medications Ordered in ED Medications  0.9 %  sodium chloride infusion ( Intravenous New Bag/Given 02/07/20 1118)  iohexol (OMNIPAQUE) 9 MG/ML oral solution (has no administration in time range)    ED Course  I have reviewed the triage vital signs and the nursing notes.  Pertinent labs & imaging results that were available during my care of the patient were reviewed by me and considered in my medical decision making (see chart for details).    MDM Rules/Calculators/A&P                           This patient is generally weak, he has right upper quadrant tenderness, he has had significant weight loss, generally needs work-up for cause of the symptoms.  He will need IV fluids, possible permacath placement, the patient is still making urine though not very much as he is not taking very much liquid.  Vital signs reviewed showing hypertension but no tachycardia or fever.  Ultrasound, labs, IV fluids, urinalysis, cardiac monitoring, patient agreeable.  I personally viewed and interpreted the EKG, normal sinus rhythm without any acute findings.  Dr. Denton Brick will admit  [name] in Emergency Department on 02/07/2020 for the symptoms described in the history of present illness. He was Vance in the context of the global COVID-19 pandemic, which necessitated consideration that the patient might be at risk for infection with the SARS-CoV-2 virus that causes COVID-19. Institutional protocols and algorithms that pertain to the evaluation of patients at risk for COVID-19 are in a state of rapid change based on information released by regulatory bodies including the CDC and federal and state organizations. These policies and algorithms were followed during the patient's care in the ED.   Final Clinical Impression(s) / ED Diagnoses Final diagnoses:  Uremia  Dehydration  Chronic renal failure, unspecified CKD stage      Noemi Chapel, MD 02/07/20 1240

## 2020-02-08 DIAGNOSIS — N184 Chronic kidney disease, stage 4 (severe): Secondary | ICD-10-CM

## 2020-02-08 DIAGNOSIS — N19 Unspecified kidney failure: Secondary | ICD-10-CM

## 2020-02-08 LAB — CBC
HCT: 25.2 % — ABNORMAL LOW (ref 39.0–52.0)
Hemoglobin: 8.1 g/dL — ABNORMAL LOW (ref 13.0–17.0)
MCH: 29.6 pg (ref 26.0–34.0)
MCHC: 32.1 g/dL (ref 30.0–36.0)
MCV: 92 fL (ref 80.0–100.0)
Platelets: 121 10*3/uL — ABNORMAL LOW (ref 150–400)
RBC: 2.74 MIL/uL — ABNORMAL LOW (ref 4.22–5.81)
RDW: 14 % (ref 11.5–15.5)
WBC: 4.1 10*3/uL (ref 4.0–10.5)
nRBC: 0 % (ref 0.0–0.2)

## 2020-02-08 LAB — URINE CULTURE: Culture: NO GROWTH

## 2020-02-08 LAB — RENAL FUNCTION PANEL
Albumin: 3.2 g/dL — ABNORMAL LOW (ref 3.5–5.0)
Anion gap: 10 (ref 5–15)
BUN: 109 mg/dL — ABNORMAL HIGH (ref 8–23)
CO2: 16 mmol/L — ABNORMAL LOW (ref 22–32)
Calcium: 8.6 mg/dL — ABNORMAL LOW (ref 8.9–10.3)
Chloride: 110 mmol/L (ref 98–111)
Creatinine, Ser: 4.62 mg/dL — ABNORMAL HIGH (ref 0.61–1.24)
GFR calc Af Amer: 14 mL/min — ABNORMAL LOW (ref >60)
GFR calc non Af Amer: 12 mL/min — ABNORMAL LOW (ref >60)
Glucose, Bld: 87 mg/dL (ref 70–99)
Phosphorus: 4.6 mg/dL (ref 2.5–4.6)
Potassium: 4.4 mmol/L (ref 3.5–5.1)
Sodium: 136 mmol/L (ref 135–145)

## 2020-02-08 NOTE — Progress Notes (Signed)
Patient ID: [name], male   DOB: May 31, 1945, 74 y.o.   MRN: [id number] S: Feeling better and able to eat breakfast today O:BP (!) 150/67 (BP Location: Left Arm)   Pulse (!) 55   Temp 97.6 F (36.4 C) (Oral)   Resp 18   Ht 5\' 9"  (1.753 m)   Wt 85.3 kg   SpO2 97%   BMI 27.77 kg/m   Intake/Output Summary (Last 24 hours) at 02/08/2020 1126 Last data filed at 02/08/2020 1000 Gross per 24 hour  Intake 2898.49 ml  Output 750 ml  Net 2148.49 ml   Intake/Output: I/O last 3 completed shifts: In: 2509.1 [P.O.:320; I.V.:2189.1] Out: 500 [Urine:500]  Intake/Output this shift:  Total I/O In: 389.4 [I.V.:389.4] Out: 250 [Urine:250] Weight change:  Gen: NAD  LNL:GXQJJHERDEY, no rub Resp: cta Abd: +BS, soft, NT/ND Ext: no edema  Recent Labs  Lab 02/06/20 1314 02/07/20 1107 02/08/20 0402  NA 135 135 136  K 4.3 4.3 4.4  CL 107 107 110  CO2 17* 17* 16*  GLUCOSE 117* 109* 87  BUN 122* 124* 109*  CREATININE 5.06* 5.25* 4.62*  ALBUMIN 3.8 3.9 3.2*  CALCIUM 9.0 9.0 8.6*  PHOS 5.2*  --  4.6  AST  --  13*  --   ALT  --  15  --    Liver Function Tests: Recent Labs  Lab 02/06/20 1314 02/07/20 1107 02/08/20 0402  AST  --  13*  --   ALT  --  15  --   ALKPHOS  --  62  --   BILITOT  --  0.8  --   PROT  --  6.8  --   ALBUMIN 3.8 3.9 3.2*   Recent Labs  Lab 02/07/20 1107  LIPASE 69*   No results for input(s): AMMONIA in the last 168 hours. CBC: Recent Labs  Lab 02/06/20 1314 02/06/20 1314 02/06/20 1322 02/07/20 1107 02/08/20 0402  WBC 5.0  --   --  4.5 4.1  NEUTROABS  --   --   --  2.6  --   HGB 9.8*   < > 9.9* 9.7* 8.1*  HCT 30.3*  --   --  29.8* 25.2*  MCV 93.8  --   --  94.0 92.0  PLT 143*  --   --  134* 121*   < > = values in this interval not displayed.   Cardiac Enzymes: No results for input(s): CKTOTAL, CKMB, CKMBINDEX, TROPONINI in the last 168 hours. CBG: No results for input(s): GLUCAP in the last 168 hours.  Iron Studies:  Recent Labs     02/06/20 1314  IRON 141  TIBC 222*   Studies/Results: CT ABDOMEN PELVIS WO CONTRAST  Result Date: 02/07/2020 CLINICAL DATA:  Acute generalized abdominal pain, weight loss. EXAM: CT ABDOMEN AND PELVIS WITHOUT CONTRAST TECHNIQUE: Multidetector CT imaging of the abdomen and pelvis was performed following the standard protocol without IV contrast. COMPARISON:  None. FINDINGS: Lower chest: No acute abnormality. Hepatobiliary: Solitary gallstone is noted. No biliary dilatation is noted. Liver is unremarkable on these unenhanced images. Pancreas: Unremarkable. No pancreatic ductal dilatation or surrounding inflammatory changes. Spleen: Normal in size without focal abnormality. 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 Left adrenal gland is unremarkable. 10.6 cm cyst is seen arising from lower pole of left kidney. Smaller cysts are seen involving both kidneys. No hydronephrosis or renal obstruction is noted. No renal or ureteral calculi are noted. Urinary bladder is decompressed. Stomach/Bowel: The  stomach appears normal. Status post appendectomy. There is no evidence of bowel obstruction or inflammation. Sigmoid diverticulosis is noted without inflammation. Vascular/Lymphatic: Aortic atherosclerosis. No enlarged abdominal or pelvic lymph nodes. Reproductive: Prostate is unremarkable. Other: No abdominal wall hernia or abnormality. No abdominopelvic ascites. Musculoskeletal: No acute or significant osseous findings. IMPRESSION: 1. Solitary gallstone. 2. 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 3. 10.6 cm left renal cyst. 4. Sigmoid diverticulosis without inflammation. 5. No acute abnormality seen in the abdomen or pelvis. 6. Aortic atherosclerosis. Aortic Atherosclerosis (ICD10-I70.0). Electronically Signed   By: Marijo Conception M.D.   On: 02/07/2020 14:49   US Abdomen Limited  Result Date: 02/07/2020 CLINICAL DATA:  Right upper quadrant pain EXAM: ULTRASOUND ABDOMEN LIMITED RIGHT UPPER  QUADRANT COMPARISON:  None. FINDINGS: Gallbladder: Mobile 12 mm gallstone. No wall thickening or sonographic Murphy sign. Common bile duct: Diameter: Normal caliber, 4 mm. Liver: Increased echotexture compatible with fatty infiltration. No focal abnormality or biliary ductal dilatation. Portal vein is patent on color Doppler imaging with normal direction of blood flow towards the liver. Other: None. IMPRESSION: Cholelithiasis.  No sonographic evidence of acute cholecystitis. Diffuse fatty infiltration of the liver. Electronically Signed   By: Rolm Baptise M.D.   On: 02/07/2020 11:22   DG Chest Port 1 View  Result Date: 02/07/2020 CLINICAL DATA:  Chills, vomiting EXAM: PORTABLE CHEST 1 VIEW COMPARISON:  05/16/2019 FINDINGS: Prior CABG. Heart is normal size. Lungs are clear. No effusions. No acute bony abnormality. IMPRESSION: No active disease. Electronically Signed   By: Rolm Baptise M.D.   On: 02/07/2020 11:00   . allopurinol  100 mg Oral Daily  . amLODipine  10 mg Oral Daily  . atorvastatin  80 mg Oral Daily  . carvedilol  6.25 mg Oral BID  . cholecalciferol  1,000 Units Oral Daily  . clopidogrel  75 mg Oral Daily  . darbepoetin (ARANESP) injection - NON-DIALYSIS  25 mcg Subcutaneous Q Sat-1800  . famotidine  20 mg Oral Daily  . finasteride  5 mg Oral Daily  . folic acid  1 mg Oral Daily  . heparin  5,000 Units Subcutaneous Q8H  . hydrALAZINE  100 mg Oral TID  . isosorbide mononitrate  120 mg Oral Daily  . levothyroxine  50 mcg Oral Q0600  . omega-3 acid ethyl esters  1,000 mg Oral BID  . sodium bicarbonate  650 mg Oral BID  . sodium chloride flush  3 mL Intravenous Q12H  . tamsulosin  0.4 mg Oral Daily    BMET    Component Value Date/Time   NA 136 02/08/2020 0402   K 4.4 02/08/2020 0402   CL 110 02/08/2020 0402   CO2 16 (L) 02/08/2020 0402   GLUCOSE 87 02/08/2020 0402   BUN 109 (H) 02/08/2020 0402   CREATININE 4.62 (H) 02/08/2020 0402   CALCIUM 8.6 (L) 02/08/2020 0402    CALCIUM 8.7 12/24/2019 1253   GFRNONAA 12 (L) 02/08/2020 0402   GFRAA 14 (L) 02/08/2020 0402   CBC    Component Value Date/Time   WBC 4.1 02/08/2020 0402   RBC 2.74 (L) 02/08/2020 0402   HGB 8.1 (L) 02/08/2020 0402   HCT 25.2 (L) 02/08/2020 0402   PLT 121 (L) 02/08/2020 0402   MCV 92.0 02/08/2020 0402   MCH 29.6 02/08/2020 0402   MCHC 32.1 02/08/2020 0402   RDW 14.0 02/08/2020 0402   LYMPHSABS 1.4 02/07/2020 1107   MONOABS 0.4 02/07/2020 1107   EOSABS 0.1 02/07/2020 1107  BASOSABS 0.0 02/07/2020 1107      Assessment/Plan: 1.  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  Presumably due to ischemic ATN in setting of protracted N/V and volume depletion.  Unclear if the N/V was due to progressive CKD or was the source of AKI.  No evidence of obstruction on abdominal CT scan.   1. Improved BUN/Cr with IVF's and no more n/v.  Improved dysgeusia. 2. Continue to follow renal function and UOP 3. I have consult [facility] for access placement early this week 4. Hopefully we will not need to start dialysis emergently and he will need TDC if BUN/Cr do not significantly improve with IVF's over the next 24-48 hours (unfortunately he came to [facility] too late for IR consult today) 5. No asterixis and nausea and vomiting have stopped but he still has dysgeusia and anorexia. 2. Protracted N/V/D- currently improved.  Antiemetics prn per primary 3. Metabolic acidosis- start sodium bicarb 4. Anemia of CKD - will dose with Aranesp, iron stores at goal 5. Chronic diastolic CHF- currently hypovolemic 6. HTN- stable 7. Cholelithiasis without cholecystitis- solitary gallstone noted 8. Fatty liver 9. CAD- stable 10. BPH- on flomax  Donetta Potts, MD [employer] [phone]

## 2020-02-08 NOTE — Consult Note (Addendum)
REASON FOR CONSULT:    For hemodialysis.  The consult is requested by Dr. Marval Regal.  ASSESSMENT & PLAN:   STAGE IV CHRONIC KIDNEY DISEASE: The patient has been seen by Dr. Marval Regal and felt to have acute kidney injury with stage IV chronic kidney disease presumably secondary to ischemic ATN in the setting of protracted nausea and vomiting with volume depletion.  We have been asked to place hemodialysis access.  Depending upon his progress Dr. Marval Regal will let us know if he needs a catheter.  Given that the patient has had his radial artery stent harvested from the left arm I would favor placing access on the right.  I will move his IV from the right arm to the left arm.  Based on his vein map that was done elsewhere he appears to be a candidate potentially for an upper arm brachiocephalic fistula on the right.  If this is not adequate he may require placement of an AV graft.  I will try to schedule this for early next week.  I have explained the indications for placement of an AV fistula or AV graft. I've explained that if at all possible we will place an AV fistula.  I have reviewed the risks of placement of an AV fistula including but not limited to: failure of the fistula to mature, need for subsequent interventions, and thrombosis. In addition I have reviewed the potential complications of placement of an AV graft. These risks include, but are not limited to, graft thrombosis, graft infection, wound healing problems, bleeding, arm swelling, and steal syndrome. All the patient's questions were answered and they are agreeable to proceed with surgery.   Deitra Mayo, MD Office: [phone]   HPI:   [name] is a pleasant 74 y.o. male, who was admitted on 02/07/2020 with worsening renal insufficiency.  The patient is felt to have acute kidney injury on top of stage IV chronic kidney disease.  The patient reports 5 weeks of nausea and vomiting and may have become dehydrated  secondary to this.  This may have been compounded by Lasix and losartan use.  The patient is right-handed.  He does not have a pacemaker.  He is not on any oral anticoagulants.  He is on Plavix.  He has undergone previous coronary revascularization and the left radial artery was harvested for this.  Past Medical History:  Diagnosis Date  . Acute myocardial infarction of other inferior wall, initial episode of care   . Anginal pain ([place])   . Anxiety   . Arthritis   . Cancer [facility])    bladder 2013 removed, has cystoscopy 10/2016, has returned x 2  . COPD (chronic obstructive pulmonary disease) ([place])   . Coronary artery disease    Artery bypass graft Jan 2008  . Dyspnea   . Dysrhythmia   . GERD (gastroesophageal reflux disease)   . Heart murmur   . History of hiatal hernia   . Hyperlipidemia   . Hypertension   . Hypothyroidism   . PONV (postoperative nausea and vomiting)   . Sleep apnea   . Stroke ([place])   . Tobacco user     Family History  Problem Relation Age of Onset  . Heart attack Mother   . Heart attack Brother     SOCIAL HISTORY: Social History   Socioeconomic History  . Marital status: Married    Spouse name: Not on file  . Number of children: Not on file  . Years of education:  Not on file  . Highest education level: Not on file  Occupational History  . Not on file  Tobacco Use  . Smoking status: Former Smoker    Packs/day: 1.00    Years: 40.00    Pack years: 40.00    Types: Cigarettes    Start date: 02/20/1959    Quit date: 09/13/2016    Years since quitting: 3.4  . Smokeless tobacco: Never Used  . Tobacco comment: 10/7 2-8 cigarettes per day. cutting back- AJ  Vaping Use  . Vaping Use: Never used  Substance and Sexual Activity  . Alcohol use: No    Alcohol/week: 0.0 standard drinks    Comment: "No, not really"  . Drug use: No  . Sexual activity: Not Currently  Other Topics Concern  . Not on file  Social History Narrative  . Not on file   Social  Determinants of Health   Financial Resource Strain:   . Difficulty of Paying Living Expenses: Not on file  Food Insecurity:   . Worried About Charity fundraiser in the Last Year: Not on file  . Ran Out of Food in the Last Year: Not on file  Transportation Needs:   . Lack of Transportation (Medical): Not on file  . Lack of Transportation (Non-Medical): Not on file  Physical Activity:   . Days of Exercise per Week: Not on file  . Minutes of Exercise per Session: Not on file  Stress:   . Feeling of Stress : Not on file  Social Connections:   . Frequency of Communication with Friends and Family: Not on file  . Frequency of Social Gatherings with Friends and Family: Not on file  . Attends Religious Services: Not on file  . Active Member of Clubs or Organizations: Not on file  . Attends Archivist Meetings: Not on file  . Marital Status: Not on file  Intimate Partner Violence:   . Fear of Current or Ex-Partner: Not on file  . Emotionally Abused: Not on file  . Physically Abused: Not on file  . Sexually Abused: Not on file    Allergies  Allergen Reactions  . Cardizem Cd [Diltiazem Hcl Er Beads] Palpitations    "Makes heart skip"    Current Facility-Administered Medications  Medication Dose Route Frequency Provider Last Rate Last Admin  . 0.9 %  sodium chloride infusion   Intravenous Continuous Eulogio Bear U, DO 100 mL/hr at 02/08/20 0813 New Bag at 02/08/20 0813  . 0.9 %  sodium chloride infusion  250 mL Intravenous PRN Emokpae, Courage, MD      . acetaminophen (TYLENOL) tablet 650 mg  650 mg Oral Q6H PRN Emokpae, Courage, MD      . allopurinol (ZYLOPRIM) tablet 100 mg  100 mg Oral Daily Emokpae, Courage, MD   100 mg at 02/08/20 0959  . amLODipine (NORVASC) tablet 10 mg  10 mg Oral Daily Denton Brick, Courage, MD   10 mg at 02/08/20 0959  . atorvastatin (LIPITOR) tablet 80 mg  80 mg Oral Daily Emokpae, Courage, MD   80 mg at 02/08/20 0959  . carvedilol (COREG) tablet 6.25  mg  6.25 mg Oral BID Emokpae, Courage, MD   6.25 mg at 02/08/20 1000  . cholecalciferol (VITAMIN D3) tablet 1,000 Units  1,000 Units Oral Daily Denton Brick, Courage, MD   1,000 Units at 02/08/20 1000  . clopidogrel (PLAVIX) tablet 75 mg  75 mg Oral Daily Emokpae, Courage, MD   75 mg at 02/08/20 1000  .  Darbepoetin Alfa (ARANESP) injection 25 mcg  25 mcg Subcutaneous Q Sat-1800 Coladonato, Joseph, MD      . famotidine (PEPCID) tablet 20 mg  20 mg Oral Daily Emokpae, Courage, MD   20 mg at 02/08/20 1000  . finasteride (PROSCAR) tablet 5 mg  5 mg Oral Daily Emokpae, Courage, MD   5 mg at 02/08/20 0959  . folic acid (FOLVITE) tablet 1 mg  1 mg Oral Daily Emokpae, Courage, MD   1 mg at 02/08/20 0959  . heparin injection 5,000 Units  5,000 Units Subcutaneous Q8H Roxan Hockey, MD   5,000 Units at 02/08/20 2637  . hydrALAZINE (APRESOLINE) tablet 100 mg  100 mg Oral TID Roxan Hockey, MD   100 mg at 02/08/20 1000  . isosorbide mononitrate (IMDUR) 24 hr tablet 120 mg  120 mg Oral Daily Emokpae, Courage, MD   120 mg at 02/08/20 1007  . labetalol (NORMODYNE) injection 10 mg  10 mg Intravenous Q4H PRN Emokpae, Courage, MD      . levothyroxine (SYNTHROID) tablet 50 mcg  50 mcg Oral Q0600 Roxan Hockey, MD   50 mcg at 02/08/20 0648  . omega-3 acid ethyl esters (LOVAZA) capsule 1,000 mg  1,000 mg Oral BID Denton Brick, Courage, MD   1,000 mg at 02/08/20 0959  . ondansetron (ZOFRAN) injection 4 mg  4 mg Intravenous Q6H PRN Emokpae, Courage, MD      . polyethylene glycol (MIRALAX / GLYCOLAX) packet 17 g  17 g Oral Daily PRN Emokpae, Courage, MD      . sodium bicarbonate tablet 650 mg  650 mg Oral BID Donato Heinz, MD   650 mg at 02/08/20 1000  . sodium chloride flush (NS) 0.9 % injection 3 mL  3 mL Intravenous Q12H Emokpae, Courage, MD   3 mL at 02/07/20 2135  . sodium chloride flush (NS) 0.9 % injection 3 mL  3 mL Intravenous PRN Emokpae, Courage, MD      . tamsulosin (FLOMAX) capsule 0.4 mg  0.4 mg Oral  Daily Emokpae, Courage, MD   0.4 mg at 02/08/20 1000  . traZODone (DESYREL) tablet 50 mg  50 mg Oral QHS PRN Roxan Hockey, MD   50 mg at 02/07/20 2134    REVIEW OF SYSTEMS:  [X]  denotes positive finding, [ ]  denotes negative finding Cardiac  Comments:  Chest pain or chest pressure:    Shortness of breath upon exertion:    Short of breath when lying flat:    Irregular heart rhythm:        Vascular    Pain in calf, thigh, or hip brought on by ambulation:    Pain in feet at night that wakes you up from your sleep:     Blood clot in your veins:    Leg swelling:         Pulmonary    Oxygen at home:    Productive cough:     Wheezing:         Neurologic X Remote h/o TIA  Sudden weakness in arms or legs:     Sudden numbness in arms or legs:     Sudden onset of difficulty speaking or slurred speech:    Temporary loss of vision in one eye:     Problems with dizziness:         Gastrointestinal    Blood in stool:     Vomited blood:         Genitourinary    Burning when urinating:  Blood in urine:        Psychiatric    Major depression:         Hematologic    Bleeding problems:    Problems with blood clotting too easily:        Skin    Rashes or ulcers:        Constitutional    Fever or chills:     PHYSICAL EXAM:   Vitals:   02/07/20 2208 02/08/20 0200 02/08/20 0557 02/08/20 1030  BP: (!) 183/55 (!) 140/55 (!) 150/62 (!) 150/67  Pulse: 68 65 (!) 51 (!) 55  Resp: 17 19 17 18   Temp: 97.7 F (36.5 C) 98.4 F (36.9 C) 97.8 F (36.6 C) 97.6 F (36.4 C)  TempSrc: Oral Oral Oral Oral  SpO2: 97% 98% 95% 97%  Weight: 85.3 kg     Height:        GENERAL: The patient is a well-nourished male, in no acute distress. The vital signs are documented above. CARDIAC: He has a systolic ejection murmur. VASCULAR: I do not detect carotid bruits. He has a palpable right radial pulse. His left radial artery has been harvested for CABG He has an IV in his right arm. He has  a palpable right dorsalis pedis pulse.  I cannot palpate a left dorsalis pedis pulse. Both feet appear adequately perfused. He has no significant lower extremity swelling. PULMONARY: There is good air exchange bilaterally without wheezing or rales. ABDOMEN: Soft and non-tender with normal pitched bowel sounds.  MUSCULOSKELETAL: There are no major deformities or cyanosis. NEUROLOGIC: No focal weakness or paresthesias are detected. SKIN: There are no ulcers or rashes noted. PSYCHIATRIC: The patient has a normal affect.  DATA:    VEIN MAPPING: I reviewed the vein map that was done elsewhere on 02/03/2020.  This also assess the arteries.  On the right side, there was a triphasic radial and ulnar waveform.  The brachial artery measures 6 mm in diameter.  The diameters of the forearm cephalic vein ranged from 2 to 4 mm.  The diameters of the upper arm cephalic vein ranged from 4 to 4 mm.  Diameters of the upper arm basilic vein ranged from 2 to 4 mm.  The patient is previously had his radial artery harvested on the left side.  On the left side there was a triphasic ulnar signal with a biphasic radial signal.  The brachial artery measured 5 mm in diameter.  The forearm cephalic vein on the left had diameters ranging from 2 mm to 2 mm.  The antecubital vein measured 3 mm.  The upper arm cephalic vein had diameters ranging from 4 to 3 mm.  The basilic vein in the upper arm on the left had diameters ranging from 9 to 10 mm.   LABS:   Covid test on 02/07/2020 was negative.  On 02/08/2020 his white blood cell count was 4.1, hemoglobin 8.1, hematocrit 25.2, platelets 121,000.  On 02/08/2020, his creatinine was 4.62.  His GFR was 12.

## 2020-02-08 NOTE — Progress Notes (Signed)
Progress Note    [name]  RKY:[id number] DOB: [birth year]  DOA: 02/07/2020 PCP: [facility]    Brief Narrative:     Medical records reviewed and are as summarized below:  [name] is an 74 y.o. male history of prior COPD, coronary bypass grafting twice, history of acid reflux, hypertension and progressive kidney dysfunction.  He was sent from his primary nephrologist stating that his creatinine has gradually and persistently worsened however over the last 6 weeks he has had a fairly significant weight loss associated with nausea and vomiting that he occurs when he eats.  He has been evaluated at the [facility] and found to have a gallstone, referred to general surgery but has not yet seen them.  He initially presented to [facility] he was Transferred to [facility] as patient will not be able to get HD access placed over the weekend at [facility] if HD is required    Assessment/Plan:   Principal Problem:   Uremia in the setting of AKI on CKD V Active Problems:   Essential hypertension   Obstructive sleep apnea   S/P CABG x 2   AKI (acute kidney injury) on CKD V   (HFpEF) heart failure with preserved ejection fraction (HCC)   CKD (chronic kidney disease), stage V ([place])   AKI on CKD V --- Versus CKD progression -?? Uremia with persistent nausea and vomiting -Suspect worsening renal function is due to dehydration/volume depletion in the setting of persistent nausea vomiting and poor oral intake, compounded by Lasix and losartan use Creatinine was 3.63 on 12/24/2019, creatinine was up to 4.14 on 12/26/2019,  --on 02/06/2020 creatinine was up to 5.06 - renally adjust medications, avoid nephrotoxic agents / dehydration  / hypotension -V fluids at this time and discontinuation of Lasix -discontinued losartan due to worsening renal function and dehydration -Monitor input/output and daily weight ----UA with proteinuria otherwise  unremarkable -Patient was sent from [facility] in case patient needed HD over the weekend, nephrology consult appreciated: Plan for continued monitoring of renal function, vascular consult for access placement next week  chronic anemia of CKD--- -Hemoglobin is 9.7 which is close to patient's recent baseline -per renal: aranesp  HFpEF--patient with chronic diastolic dysfunction CHF -Echo from January 2021 revealed echo with EF down to 60 to 65% from65 to 31% with diastolic dysfunction -Patient is currently dehydrated/volume depleted -Lasix has been discontinued -IV fluids initiated  CAD-prior CABG in 2008,LHC in 2015 with angioplasty and stenting--- -Troponin is 31 in the setting of worsening renal function--- patient is chest pain-free currently  -Continue Lipitor, Plavix, Coreg and isosorbide  Hypothyroidism--- stable, continue Levothyroxine 50 mcg daily  HTN-- - restart amlodipine 10 mg daily, along with hydralazine 100 mg 3 times daily, isosorbide 120 mg daily and Coreg 6.25 mg twice daily, losartan and Lasix discontinued due to worsening renal function -May use IV labetalol as needed elevated BP  BPH--stable, continue Flomax  cholelithiasis without cholecystitis--- abdominal ultrasound and CT abdomen and pelvis noted -LFTs are not elevated -Borderline elevation of lipase noted, doubt clinically significant pancreatitis -has outpatient GS follow up  10.6 CM-left renal cyst -defer to renal     Family Communication/Anticipated D/C date and plan/Code Status   DVT prophylaxis: heparin Code Status: Full Code.  Disposition Plan: Status is: Inpatient  Remains inpatient appropriate because:Inpatient level of care appropriate due to severity of illness   Dispo: The patient is from: Home  Anticipated d/c is to: Home              Anticipated d/c date is: 3 days              Patient currently is not medically stable to d/c.          Medical Consultants:    renal  Subjective:   Making lots of urine, feels like appetite is improving   Objective:    Vitals:   02/07/20 2208 02/08/20 0200 02/08/20 0557 02/08/20 1030  BP: (!) 183/55 (!) 140/55 (!) 150/62 (!) 150/67  Pulse: 68 65 (!) 51 (!) 55  Resp: 17 19 17 18   Temp: 97.7 F (36.5 C) 98.4 F (36.9 C) 97.8 F (36.6 C) 97.6 F (36.4 C)  TempSrc: Oral Oral Oral Oral  SpO2: 97% 98% 95% 97%  Weight: 85.3 kg     Height:        Intake/Output Summary (Last 24 hours) at 02/08/2020 1148 Last data filed at 02/08/2020 1000 Gross per 24 hour  Intake 2898.49 ml  Output 750 ml  Net 2148.49 ml   Filed Weights   02/07/20 1023 02/07/20 1800 02/07/20 2208  Weight: 87.1 kg 85.3 kg 85.3 kg    Exam:  General: Appearance:     Overweight male in no acute distress     Lungs:     Clear to auscultation bilaterally, respirations unlabored  Heart:    Bradycardic. Normal rhythm. No murmurs, rubs, or gallops.   MS:   All extremities are intact.   Neurologic:   Awake, alert, oriented x 3. No apparent focal neurological           defect.     Data Reviewed:   I have personally reviewed following labs and imaging studies:  Labs: Labs show the following:   Basic Metabolic Panel: Recent Labs  Lab 02/06/20 1314 02/06/20 1314 02/07/20 1107 02/08/20 0402  NA 135  --  135 136  K 4.3   < > 4.3 4.4  CL 107  --  107 110  CO2 17*  --  17* 16*  GLUCOSE 117*  --  109* 87  BUN 122*  --  124* 109*  CREATININE 5.06*  --  5.25* 4.62*  CALCIUM 9.0  --  9.0 8.6*  PHOS 5.2*  --   --  4.6   < > = values in this interval not displayed.   GFR Estimated Creatinine Clearance: 15.4 mL/min (A) (by C-G formula based on SCr of 4.62 mg/dL (H)). Liver Function Tests: Recent Labs  Lab 02/06/20 1314 02/07/20 1107 02/08/20 0402  AST  --  13*  --   ALT  --  15  --   ALKPHOS  --  62  --   BILITOT  --  0.8  --   PROT  --  6.8  --   ALBUMIN 3.8 3.9 3.2*   Recent Labs  Lab  02/07/20 1107  LIPASE 69*   No results for input(s): AMMONIA in the last 168 hours. Coagulation profile No results for input(s): INR, PROTIME in the last 168 hours.  CBC: Recent Labs  Lab 02/06/20 1314 02/06/20 1322 02/07/20 1107 02/08/20 0402  WBC 5.0  --  4.5 4.1  NEUTROABS  --   --  2.6  --   HGB 9.8* 9.9* 9.7* 8.1*  HCT 30.3*  --  29.8* 25.2*  MCV 93.8  --  94.0 92.0  PLT 143*  --  134* 121*  Cardiac Enzymes: No results for input(s): CKTOTAL, CKMB, CKMBINDEX, TROPONINI in the last 168 hours. BNP (last 3 results) No results for input(s): PROBNP in the last 8760 hours. CBG: No results for input(s): GLUCAP in the last 168 hours. D-Dimer: No results for input(s): DDIMER in the last 72 hours. Hgb A1c: No results for input(s): HGBA1C in the last 72 hours. Lipid Profile: Recent Labs    02/07/20 2130  CHOL 136  HDL 21*  LDLCALC 88  TRIG 136  CHOLHDL 6.5   Thyroid function studies: No results for input(s): TSH, T4TOTAL, T3FREE, THYROIDAB in the last 72 hours.  Invalid input(s): FREET3 Anemia work up: Recent Labs    02/06/20 1314  TIBC 222*  IRON 141   Sepsis Labs: Recent Labs  Lab 02/06/20 1314 02/07/20 1107 02/08/20 0402  WBC 5.0 4.5 4.1  LATICACIDVEN  --  0.8  --     Microbiology Recent Results (from the past 240 hour(s))  Respiratory Panel by RT PCR (Flu A&B, Covid) - Nasopharyngeal Swab     Status: None   Collection Time: 02/07/20  1:20 PM   Specimen: Nasopharyngeal Swab  Result Value Ref Range Status   SARS Coronavirus 2 by RT PCR NEGATIVE NEGATIVE Final    Comment: (NOTE) SARS-CoV-2 target nucleic acids are NOT DETECTED.  The SARS-CoV-2 RNA is generally detectable in upper respiratoy specimens during the acute phase of infection. The lowest concentration of SARS-CoV-2 viral copies this assay can detect is 131 copies/mL. A negative result does not preclude SARS-Cov-2 infection and should not be used as the sole basis for treatment  or other patient management decisions. A negative result may occur with  improper specimen collection/handling, submission of specimen other than nasopharyngeal swab, presence of viral mutation(s) within the areas targeted by this assay, and inadequate number of viral copies (<131 copies/mL). A negative result must be combined with clinical observations, patient history, and epidemiological information. The expected result is Negative.  Fact Sheet for Patients:  PinkCheek.be  Fact Sheet for Healthcare Providers:  GravelBags.it  This test is no t yet approved or cleared by the [place] FDA and  has been authorized for detection and/or diagnosis of SARS-CoV-2 by FDA under an Emergency Use Authorization (EUA). This EUA will remain  in effect (meaning this test can be used) for the duration of the COVID-19 declaration under Section 564(b)(1) of the Act, 21 U.S.C. section 360bbb-3(b)(1), unless the authorization is terminated or revoked sooner.     Influenza A by PCR NEGATIVE NEGATIVE Final   Influenza B by PCR NEGATIVE NEGATIVE Final    Comment: (NOTE) The Xpert Xpress SARS-CoV-2/FLU/RSV assay is intended as an aid in  the diagnosis of influenza from Nasopharyngeal swab specimens and  should not be used as a sole basis for treatment. Nasal washings and  aspirates are unacceptable for Xpert Xpress SARS-CoV-2/FLU/RSV  testing.  Fact Sheet for Patients: PinkCheek.be  Fact Sheet for Healthcare Providers: GravelBags.it  This test is not yet approved or cleared by the [place] FDA and  has been authorized for detection and/or diagnosis of SARS-CoV-2 by  FDA under an Emergency Use Authorization (EUA). This EUA will remain  in effect (meaning this test can be used) for the duration of the  Covid-19 declaration under Section 564(b)(1) of the Act, 21  U.S.C.  section 360bbb-3(b)(1), unless the authorization is  terminated or revoked. Performed at [facility], [address] [place], [place] [address]     Procedures and diagnostic studies:  CT ABDOMEN PELVIS WO CONTRAST  Result Date: 02/07/2020 CLINICAL DATA:  Acute generalized abdominal pain, weight loss. EXAM: CT ABDOMEN AND PELVIS WITHOUT CONTRAST TECHNIQUE: Multidetector CT imaging of the abdomen and pelvis was performed following the standard protocol without IV contrast. COMPARISON:  None. FINDINGS: Lower chest: No acute abnormality. Hepatobiliary: Solitary gallstone is noted. No biliary dilatation is noted. Liver is unremarkable on these unenhanced images. Pancreas: Unremarkable. No pancreatic ductal dilatation or surrounding inflammatory changes. Spleen: Normal in size without focal abnormality. 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 Left adrenal gland is unremarkable. 10.6 cm cyst is seen arising from lower pole of left kidney. Smaller cysts are seen involving both kidneys. No hydronephrosis or renal obstruction is noted. No renal or ureteral calculi are noted. Urinary bladder is decompressed. Stomach/Bowel: The stomach appears normal. Status post appendectomy. There is no evidence of bowel obstruction or inflammation. Sigmoid diverticulosis is noted without inflammation. Vascular/Lymphatic: Aortic atherosclerosis. No enlarged abdominal or pelvic lymph nodes. Reproductive: Prostate is unremarkable. Other: No abdominal wall hernia or abnormality. No abdominopelvic ascites. Musculoskeletal: No acute or significant osseous findings. IMPRESSION: 1. Solitary gallstone. 2. 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 3. 10.6 cm left renal cyst. 4. Sigmoid diverticulosis without inflammation. 5. No acute abnormality seen in the abdomen or pelvis. 6. Aortic atherosclerosis. Aortic Atherosclerosis (ICD10-I70.0). Electronically Signed   By: Marijo Conception M.D.   On: 02/07/2020 14:49   US  Abdomen Limited  Result Date: 02/07/2020 CLINICAL DATA:  Right upper quadrant pain EXAM: ULTRASOUND ABDOMEN LIMITED RIGHT UPPER QUADRANT COMPARISON:  None. FINDINGS: Gallbladder: Mobile 12 mm gallstone. No wall thickening or sonographic Murphy sign. Common bile duct: Diameter: Normal caliber, 4 mm. Liver: Increased echotexture compatible with fatty infiltration. No focal abnormality or biliary ductal dilatation. Portal vein is patent on color Doppler imaging with normal direction of blood flow towards the liver. Other: None. IMPRESSION: Cholelithiasis.  No sonographic evidence of acute cholecystitis. Diffuse fatty infiltration of the liver. Electronically Signed   By: Rolm Baptise M.D.   On: 02/07/2020 11:22   DG Chest Port 1 View  Result Date: 02/07/2020 CLINICAL DATA:  Chills, vomiting EXAM: PORTABLE CHEST 1 VIEW COMPARISON:  05/16/2019 FINDINGS: Prior CABG. Heart is normal size. Lungs are clear. No effusions. No acute bony abnormality. IMPRESSION: No active disease. Electronically Signed   By: Rolm Baptise M.D.   On: 02/07/2020 11:00    Medications:   . allopurinol  100 mg Oral Daily  . amLODipine  10 mg Oral Daily  . atorvastatin  80 mg Oral Daily  . carvedilol  6.25 mg Oral BID  . cholecalciferol  1,000 Units Oral Daily  . clopidogrel  75 mg Oral Daily  . darbepoetin (ARANESP) injection - NON-DIALYSIS  25 mcg Subcutaneous Q Sat-1800  . famotidine  20 mg Oral Daily  . finasteride  5 mg Oral Daily  . folic acid  1 mg Oral Daily  . heparin  5,000 Units Subcutaneous Q8H  . hydrALAZINE  100 mg Oral TID  . isosorbide mononitrate  120 mg Oral Daily  . levothyroxine  50 mcg Oral Q0600  . omega-3 acid ethyl esters  1,000 mg Oral BID  . sodium bicarbonate  650 mg Oral BID  . sodium chloride flush  3 mL Intravenous Q12H  . tamsulosin  0.4 mg Oral Daily   Continuous Infusions: . sodium chloride 100 mL/hr at 02/08/20 0813  . sodium chloride       LOS: 1 day  Geradine Girt  [facility]   How to contact the [facility] Attending or Consulting provider [place] or covering provider during after hours [place], for this patient?  1. Check the care team in [facility] and look for a) attending/consulting [facility] provider listed and b) the [facility] team listed 2. Log into [email or web address] and use [place]'s universal password to access. If you do not have the password, please contact the hospital operator. 3. Locate the [facility] provider you are looking for under [facility] and page to a number that you can be directly reached. 4. If you still have difficulty reaching the provider, please page the [facility] (Director on Call) for the Hospitalists listed on amion for assistance.  02/08/2020, 11:48 AM

## 2020-02-09 LAB — CBC
HCT: 26 % — ABNORMAL LOW (ref 39.0–52.0)
Hemoglobin: 8.3 g/dL — ABNORMAL LOW (ref 13.0–17.0)
MCH: 29.4 pg (ref 26.0–34.0)
MCHC: 31.9 g/dL (ref 30.0–36.0)
MCV: 92.2 fL (ref 80.0–100.0)
Platelets: 119 10*3/uL — ABNORMAL LOW (ref 150–400)
RBC: 2.82 MIL/uL — ABNORMAL LOW (ref 4.22–5.81)
RDW: 13.8 % (ref 11.5–15.5)
WBC: 3.8 10*3/uL — ABNORMAL LOW (ref 4.0–10.5)
nRBC: 0 % (ref 0.0–0.2)

## 2020-02-09 LAB — URINALYSIS, COMPLETE (UACMP) WITH MICROSCOPIC
Bacteria, UA: NONE SEEN
Bilirubin Urine: NEGATIVE
Glucose, UA: NEGATIVE mg/dL
Hgb urine dipstick: NEGATIVE
Ketones, ur: NEGATIVE mg/dL
Leukocytes,Ua: NEGATIVE
Nitrite: NEGATIVE
Protein, ur: 100 mg/dL — AB
Specific Gravity, Urine: 1.012 (ref 1.005–1.030)
pH: 5 (ref 5.0–8.0)

## 2020-02-09 LAB — FERRITIN: Ferritin: 743 ng/mL — ABNORMAL HIGH (ref 24–336)

## 2020-02-09 LAB — BASIC METABOLIC PANEL
Anion gap: 8 (ref 5–15)
BUN: 96 mg/dL — ABNORMAL HIGH (ref 8–23)
CO2: 16 mmol/L — ABNORMAL LOW (ref 22–32)
Calcium: 8.3 mg/dL — ABNORMAL LOW (ref 8.9–10.3)
Chloride: 111 mmol/L (ref 98–111)
Creatinine, Ser: 3.93 mg/dL — ABNORMAL HIGH (ref 0.61–1.24)
GFR calc Af Amer: 16 mL/min — ABNORMAL LOW (ref >60)
GFR calc non Af Amer: 14 mL/min — ABNORMAL LOW (ref >60)
Glucose, Bld: 155 mg/dL — ABNORMAL HIGH (ref 70–99)
Potassium: 4.3 mmol/L (ref 3.5–5.1)
Sodium: 135 mmol/L (ref 135–145)

## 2020-02-09 LAB — SODIUM, URINE, RANDOM: Sodium, Ur: 42 mmol/L

## 2020-02-09 LAB — CREATININE, URINE, RANDOM: Creatinine, Urine: 89.01 mg/dL

## 2020-02-09 MED ORDER — PROSOURCE PLUS PO LIQD
30.0000 mL | Freq: Two times a day (BID) | ORAL | Status: DC
  Administered 2020-02-09 – 2020-02-11 (×4): 30 mL via ORAL
  Filled 2020-02-09 (×4): qty 30

## 2020-02-09 NOTE — Progress Notes (Signed)
Progress Note    [name]  GBT:[id number] DOB: 1945/07/10  DOA: 02/07/2020 PCP: [facility]    Brief Narrative:     Medical records reviewed and are as summarized below:  [name] is an 74 y.o. male history of prior COPD, coronary bypass grafting twice, history of acid reflux, hypertension and progressive kidney dysfunction.  He was sent from his primary nephrologist stating that his creatinine has gradually and persistently worsened however over the last 6 weeks he has had a fairly significant weight loss associated with nausea and vomiting that he occurs when he eats.  He has been evaluated at the [facility] and found to have a gallstone, referred to general surgery but has not yet seen them.  He initially presented to [facility] he was Transferred to [facility] as patient will not be able to get HD access placed over the weekend at [facility] if HD is required.   Assessment/Plan:   Principal Problem:   Uremia in the setting of AKI on CKD V Active Problems:   Essential hypertension   Obstructive sleep apnea   S/P CABG x 2   AKI (acute kidney injury) on CKD V   (HFpEF) heart failure with preserved ejection fraction (HCC)   CKD (chronic kidney disease), stage V ([place])   AKI on CKD V --- Versus CKD progression -improving symptoms -Suspect worsening renal function is due to dehydration/volume depletion in the setting of persistent nausea vomiting and poor oral intake, compounded by Lasix and losartan use --Creatinine was 3.63 on 12/24/2019, creatinine was up to 4.14 on 12/26/2019 --on 02/06/2020 creatinine was up to 5.06 --renally adjust medications, avoid nephrotoxic agents / dehydration  / hypotension -IV fluids at this time and discontinuation of Lasix --discontinued losartan due to worsening renal function and dehydration -Monitor input/output and daily weight ---UA with proteinuria otherwise unremarkable -Patient was sent from  [facility] in case patient needed HD over the weekend, nephrology consult appreciated: Plan for continued monitoring of renal function, vascular consult for access placement next week -vascular consult: tentative plan for access placement on Tuesday  chronic anemia of CKD--- -Hemoglobin is 9.7 which is close to patient's recent baseline -per renal: aranesp  HFpEF--patient with chronic diastolic dysfunction CHF -Echo from January 2021 revealed echo with EF down to 60 to 65% from65 to 71% with diastolic dysfunction -Patient is currently dehydrated/volume depleted -Lasix has been discontinued -IV fluids initiated  CAD-prior CABG in 2008,LHC in 2015 with angioplasty and stenting -Troponin is 31 in the setting of worsening renal function--- patient is chest pain-free currently  -Continue Lipitor, Plavix, Coreg and isosorbide  Hypothyroidism--- stable, continue Levothyroxine 50 mcg daily  HTN-- - restart amlodipine 10 mg daily, along with hydralazine 100 mg 3 times daily, isosorbide 120 mg daily and Coreg 6.25 mg twice daily, losartan and Lasix discontinued due to worsening renal function -May use IV labetalol as needed elevated BP  BPH--stable, continue Flomax  cholelithiasis without cholecystitis--- abdominal ultrasound and CT abdomen and pelvis noted -LFTs are not elevated -Borderline elevation of lipase noted, doubt clinically significant pancreatitis -has outpatient GS follow up  10.6 CM-left renal cyst -defer to renal     Family Communication/Anticipated D/C date and plan/Code Status   DVT prophylaxis: heparin Code Status: Full Code.  Disposition Plan: Status is: Inpatient  Remains inpatient appropriate because:Inpatient level of care appropriate due to severity of illness   Dispo: The patient is from: Home  Anticipated d/c is to: Home              Anticipated d/c date is: 3 days              Patient currently is not medically stable  to d/c.         Medical Consultants:    Renal  Vascular consult  Subjective:   Labs pending this AM   Objective:    Vitals:   02/08/20 2027 02/08/20 2300 02/09/20 0442 02/09/20 0909  BP: (!) 155/47  (!) 142/53 (!) 152/65  Pulse: (!) 54 (!) 57 74   Resp: 16 18 17    Temp: 97.6 F (36.4 C)  (!) 97.4 F (36.3 C)   TempSrc: Oral  Oral   SpO2: 95% 95% 96%   Weight: 88.8 kg     Height:        Intake/Output Summary (Last 24 hours) at 02/09/2020 1019 Last data filed at 02/09/2020 0900 Gross per 24 hour  Intake 2282.29 ml  Output 1500 ml  Net 782.29 ml   Filed Weights   02/07/20 1800 02/07/20 2208 02/08/20 2027  Weight: 85.3 kg 85.3 kg 88.8 kg    Exam:  General: Appearance:     Overweight male in no acute distress     Lungs:     Clear to auscultation bilaterally, respirations unlabored  Heart:    Normal heart rate. Normal rhythm. No murmurs, rubs, or gallops.   MS:   All extremities are intact.   Neurologic:   Awake, alert, oriented x 3. No apparent focal neurological           defect.     Data Reviewed:   I have personally reviewed following labs and imaging studies:  Labs: Labs show the following:   Basic Metabolic Panel: Recent Labs  Lab 02/06/20 1314 02/06/20 1314 02/07/20 1107 02/08/20 0402  NA 135  --  135 136  K 4.3   < > 4.3 4.4  CL 107  --  107 110  CO2 17*  --  17* 16*  GLUCOSE 117*  --  109* 87  BUN 122*  --  124* 109*  CREATININE 5.06*  --  5.25* 4.62*  CALCIUM 9.0  --  9.0 8.6*  PHOS 5.2*  --   --  4.6   < > = values in this interval not displayed.   GFR Estimated Creatinine Clearance: 15.7 mL/min (A) (by C-G formula based on SCr of 4.62 mg/dL (H)). Liver Function Tests: Recent Labs  Lab 02/06/20 1314 02/07/20 1107 02/08/20 0402  AST  --  13*  --   ALT  --  15  --   ALKPHOS  --  62  --   BILITOT  --  0.8  --   PROT  --  6.8  --   ALBUMIN 3.8 3.9 3.2*   Recent Labs  Lab 02/07/20 1107  LIPASE 69*   No results for  input(s): AMMONIA in the last 168 hours. Coagulation profile No results for input(s): INR, PROTIME in the last 168 hours.  CBC: Recent Labs  Lab 02/06/20 1314 02/06/20 1322 02/07/20 1107 02/08/20 0402  WBC 5.0  --  4.5 4.1  NEUTROABS  --   --  2.6  --   HGB 9.8* 9.9* 9.7* 8.1*  HCT 30.3*  --  29.8* 25.2*  MCV 93.8  --  94.0 92.0  PLT 143*  --  134* 121*   Cardiac Enzymes: No results for input(s):  CKTOTAL, CKMB, CKMBINDEX, TROPONINI in the last 168 hours. BNP (last 3 results) No results for input(s): PROBNP in the last 8760 hours. CBG: No results for input(s): GLUCAP in the last 168 hours. D-Dimer: No results for input(s): DDIMER in the last 72 hours. Hgb A1c: No results for input(s): HGBA1C in the last 72 hours. Lipid Profile: Recent Labs    02/07/20 2130  CHOL 136  HDL 21*  LDLCALC 88  TRIG 136  CHOLHDL 6.5   Thyroid function studies: No results for input(s): TSH, T4TOTAL, T3FREE, THYROIDAB in the last 72 hours.  Invalid input(s): FREET3 Anemia work up: Recent Labs    02/06/20 1314  TIBC 222*  IRON 141   Sepsis Labs: Recent Labs  Lab 02/06/20 1314 02/07/20 1107 02/08/20 0402  WBC 5.0 4.5 4.1  LATICACIDVEN  --  0.8  --     Microbiology Recent Results (from the past 240 hour(s))  Urine Culture     Status: None   Collection Time: 02/07/20 12:14 PM   Specimen: Urine, Clean Catch  Result Value Ref Range Status   Specimen Description   Final    URINE, CLEAN CATCH Performed at [facility], [address], [place], [place] [address]    Special Requests   Final    NONE Performed at [facility], [address], [place], [place] [address]    Culture   Final    NO GROWTH Performed at [facility], [place] [address] [place], [place] [address]    Report Status 02/08/2020 FINAL  Final  Respiratory Panel by RT PCR (Flu A&B, Covid) - Nasopharyngeal Swab     Status: None   Collection Time: 02/07/20  1:20 PM   Specimen: Nasopharyngeal Swab  Result  Value Ref Range Status   SARS Coronavirus 2 by RT PCR NEGATIVE NEGATIVE Final    Comment: (NOTE) SARS-CoV-2 target nucleic acids are NOT DETECTED.  The SARS-CoV-2 RNA is generally detectable in upper respiratoy specimens during the acute phase of infection. The lowest concentration of SARS-CoV-2 viral copies this assay can detect is 131 copies/mL. A negative result does not preclude SARS-Cov-2 infection and should not be used as the sole basis for treatment or other patient management decisions. A negative result may occur with  improper specimen collection/handling, submission of specimen other than nasopharyngeal swab, presence of viral mutation(s) within the areas targeted by this assay, and inadequate number of viral copies (<131 copies/mL). A negative result must be combined with clinical observations, patient history, and epidemiological information. The expected result is Negative.  Fact Sheet for Patients:  PinkCheek.be  Fact Sheet for Healthcare Providers:  GravelBags.it  This test is no t yet approved or cleared by the [place] FDA and  has been authorized for detection and/or diagnosis of SARS-CoV-2 by FDA under an Emergency Use Authorization (EUA). This EUA will remain  in effect (meaning this test can be used) for the duration of the COVID-19 declaration under Section 564(b)(1) of the Act, 21 U.S.C. section 360bbb-3(b)(1), unless the authorization is terminated or revoked sooner.     Influenza A by PCR NEGATIVE NEGATIVE Final   Influenza B by PCR NEGATIVE NEGATIVE Final    Comment: (NOTE) The Xpert Xpress SARS-CoV-2/FLU/RSV assay is intended as an aid in  the diagnosis of influenza from Nasopharyngeal swab specimens and  should not be used as a sole basis for treatment. Nasal washings and  aspirates are unacceptable for Xpert Xpress SARS-CoV-2/FLU/RSV  testing.  Fact Sheet for  Patients: PinkCheek.be  Fact Sheet for Healthcare Providers: GravelBags.it  This test is not yet approved or cleared by the [place] FDA and  has been authorized for detection and/or diagnosis of SARS-CoV-2 by  FDA under an Emergency Use Authorization (EUA). This EUA will remain  in effect (meaning this test can be used) for the duration of the  Covid-19 declaration under Section 564(b)(1) of the Act, 21  U.S.C. section 360bbb-3(b)(1), unless the authorization is  terminated or revoked. Performed at [facility], [address] [place], [place] [address]     Procedures and diagnostic studies:  CT ABDOMEN PELVIS WO CONTRAST  Result Date: 02/07/2020 CLINICAL DATA:  Acute generalized abdominal pain, weight loss. EXAM: CT ABDOMEN AND PELVIS WITHOUT CONTRAST TECHNIQUE: Multidetector CT imaging of the abdomen and pelvis was performed following the standard protocol without IV contrast. COMPARISON:  None. FINDINGS: Lower chest: No acute abnormality. Hepatobiliary: Solitary gallstone is noted. No biliary dilatation is noted. Liver is unremarkable on these unenhanced images. Pancreas: Unremarkable. No pancreatic ductal dilatation or surrounding inflammatory changes. Spleen: Normal in size without focal abnormality. 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 Left adrenal gland is unremarkable. 10.6 cm cyst is seen arising from lower pole of left kidney. Smaller cysts are seen involving both kidneys. No hydronephrosis or renal obstruction is noted. No renal or ureteral calculi are noted. Urinary bladder is decompressed. Stomach/Bowel: The stomach appears normal. Status post appendectomy. There is no evidence of bowel obstruction or inflammation. Sigmoid diverticulosis is noted without inflammation. Vascular/Lymphatic: Aortic atherosclerosis. No enlarged abdominal or pelvic lymph nodes. Reproductive: Prostate is  unremarkable. Other: No abdominal wall hernia or abnormality. No abdominopelvic ascites. Musculoskeletal: No acute or significant osseous findings. IMPRESSION: 1. Solitary gallstone. 2. 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 3. 10.6 cm left renal cyst. 4. Sigmoid diverticulosis without inflammation. 5. No acute abnormality seen in the abdomen or pelvis. 6. Aortic atherosclerosis. Aortic Atherosclerosis (ICD10-I70.0). Electronically Signed   By: Marijo Conception M.D.   On: 02/07/2020 14:49   US Abdomen Limited  Result Date: 02/07/2020 CLINICAL DATA:  Right upper quadrant pain EXAM: ULTRASOUND ABDOMEN LIMITED RIGHT UPPER QUADRANT COMPARISON:  None. FINDINGS: Gallbladder: Mobile 12 mm gallstone. No wall thickening or sonographic Murphy sign. Common bile duct: Diameter: Normal caliber, 4 mm. Liver: Increased echotexture compatible with fatty infiltration. No focal abnormality or biliary ductal dilatation. Portal vein is patent on color Doppler imaging with normal direction of blood flow towards the liver. Other: None. IMPRESSION: Cholelithiasis.  No sonographic evidence of acute cholecystitis. Diffuse fatty infiltration of the liver. Electronically Signed   By: Rolm Baptise M.D.   On: 02/07/2020 11:22   DG Chest Port 1 View  Result Date: 02/07/2020 CLINICAL DATA:  Chills, vomiting EXAM: PORTABLE CHEST 1 VIEW COMPARISON:  05/16/2019 FINDINGS: Prior CABG. Heart is normal size. Lungs are clear. No effusions. No acute bony abnormality. IMPRESSION: No active disease. Electronically Signed   By: Rolm Baptise M.D.   On: 02/07/2020 11:00    Medications:   . allopurinol  100 mg Oral Daily  . amLODipine  10 mg Oral Daily  . atorvastatin  80 mg Oral Daily  . carvedilol  6.25 mg Oral BID  . cholecalciferol  1,000 Units Oral Daily  . clopidogrel  75 mg Oral Daily  . darbepoetin (ARANESP) injection - NON-DIALYSIS  25 mcg Subcutaneous Q Sat-1800  . famotidine  20 mg Oral Daily  . finasteride  5 mg Oral Daily  . folic  acid  1 mg Oral Daily  . heparin  5,000 Units Subcutaneous Q8H  . hydrALAZINE  100 mg Oral TID  . isosorbide mononitrate  120 mg Oral Daily  . levothyroxine  50 mcg Oral Q0600  . omega-3 acid ethyl esters  1,000 mg Oral BID  . sodium bicarbonate  650 mg Oral BID  . sodium chloride flush  3 mL Intravenous Q12H  . tamsulosin  0.4 mg Oral Daily   Continuous Infusions: . sodium chloride 75 mL/hr at 02/08/20 1917  . sodium chloride       LOS: 2 days   Geradine Girt  [facility]   How to contact the [facility] Attending or Consulting provider [place] or covering provider during after hours [place], for this patient?  1. Check the care team in [facility] and look for a) attending/consulting [facility] provider listed and b) the [facility] team listed 2. Log into [email or web address] and use [facility]'s universal password to access. If you do not have the password, please contact the hospital operator. 3. Locate the [facility] provider you are looking for under [facility] and page to a number that you can be directly reached. 4. If you still have difficulty reaching the provider, please page the [facility] (Director on Call) for the Hospitalists listed on amion for assistance.  02/09/2020, 10:19 AM

## 2020-02-09 NOTE — Progress Notes (Signed)
Patient ID: [name], male   DOB: 26-Oct-1945, 73 y.o.   MRN: [id number] S: Feels better today.  No N/V and improved appetite. O:BP (!) 152/65   Pulse 74   Temp (!) 97.4 F (36.3 C) (Oral)   Resp 17   Ht 5\' 9"  (1.753 m)   Wt 88.8 kg   SpO2 96%   BMI 28.91 kg/m   Intake/Output Summary (Last 24 hours) at 02/09/2020 1152 Last data filed at 02/09/2020 0900 Gross per 24 hour  Intake 2282.29 ml  Output 1500 ml  Net 782.29 ml   Intake/Output: I/O last 3 completed shifts: In: 4420.8 [P.O.:1220; I.V.:3200.8] Out: 1950 [QIONG:2952]  Intake/Output this shift:  Total I/O In: -  Out: 300 [Urine:300] Weight change: 1.709 kg Gen: NAD CVS: no rub Resp: cta Abd: benign Ext: no edema  Recent Labs  Lab 02/06/20 1314 02/07/20 1107 02/08/20 0402 02/09/20 1021  NA 135 135 136 135  K 4.3 4.3 4.4 4.3  CL 107 107 110 111  CO2 17* 17* 16* 16*  GLUCOSE 117* 109* 87 155*  BUN 122* 124* 109* 96*  CREATININE 5.06* 5.25* 4.62* 3.93*  ALBUMIN 3.8 3.9 3.2*  --   CALCIUM 9.0 9.0 8.6* 8.3*  PHOS 5.2*  --  4.6  --   AST  --  13*  --   --   ALT  --  15  --   --    Liver Function Tests: Recent Labs  Lab 02/06/20 1314 02/07/20 1107 02/08/20 0402  AST  --  13*  --   ALT  --  15  --   ALKPHOS  --  62  --   BILITOT  --  0.8  --   PROT  --  6.8  --   ALBUMIN 3.8 3.9 3.2*   Recent Labs  Lab 02/07/20 1107  LIPASE 69*   No results for input(s): AMMONIA in the last 168 hours. CBC: Recent Labs  Lab 02/06/20 1314 02/06/20 1322 02/07/20 1107 02/08/20 0402 02/09/20 1021  WBC 5.0   < > 4.5 4.1 3.8*  NEUTROABS  --   --  2.6  --   --   HGB 9.8*   < > 9.7* 8.1* 8.3*  HCT 30.3*   < > 29.8* 25.2* 26.0*  MCV 93.8  --  94.0 92.0 92.2  PLT 143*   < > 134* 121* 119*   < > = values in this interval not displayed.   Cardiac Enzymes: No results for input(s): CKTOTAL, CKMB, CKMBINDEX, TROPONINI in the last 168 hours. CBG: No results for input(s): GLUCAP in the last 168 hours.  Iron  Studies:  Recent Labs    02/06/20 1314 02/09/20 1021  IRON 141  --   TIBC 222*  --   FERRITIN  --  743*   Studies/Results: CT ABDOMEN PELVIS WO CONTRAST  Result Date: 02/07/2020 CLINICAL DATA:  Acute generalized abdominal pain, weight loss. EXAM: CT ABDOMEN AND PELVIS WITHOUT CONTRAST TECHNIQUE: Multidetector CT imaging of the abdomen and pelvis was performed following the standard protocol without IV contrast. COMPARISON:  None. FINDINGS: Lower chest: No acute abnormality. Hepatobiliary: Solitary gallstone is noted. No biliary dilatation is noted. Liver is unremarkable on these unenhanced images. Pancreas: Unremarkable. No pancreatic ductal dilatation or surrounding inflammatory changes. Spleen: Normal in size without focal abnormality. 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 Left adrenal gland is unremarkable. 10.6 cm cyst is seen arising from lower pole of left kidney. Smaller  cysts are seen involving both kidneys. No hydronephrosis or renal obstruction is noted. No renal or ureteral calculi are noted. Urinary bladder is decompressed. Stomach/Bowel: The stomach appears normal. Status post appendectomy. There is no evidence of bowel obstruction or inflammation. Sigmoid diverticulosis is noted without inflammation. Vascular/Lymphatic: Aortic atherosclerosis. No enlarged abdominal or pelvic lymph nodes. Reproductive: Prostate is unremarkable. Other: No abdominal wall hernia or abnormality. No abdominopelvic ascites. Musculoskeletal: No acute or significant osseous findings. IMPRESSION: 1. Solitary gallstone. 2. 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 3. 10.6 cm left renal cyst. 4. Sigmoid diverticulosis without inflammation. 5. No acute abnormality seen in the abdomen or pelvis. 6. Aortic atherosclerosis. Aortic Atherosclerosis (ICD10-I70.0). Electronically Signed   By: Marijo Conception M.D.   On: 02/07/2020 14:49   . allopurinol  100 mg Oral Daily  . amLODipine  10 mg Oral Daily   . atorvastatin  80 mg Oral Daily  . carvedilol  6.25 mg Oral BID  . cholecalciferol  1,000 Units Oral Daily  . clopidogrel  75 mg Oral Daily  . darbepoetin (ARANESP) injection - NON-DIALYSIS  25 mcg Subcutaneous Q Sat-1800  . famotidine  20 mg Oral Daily  . finasteride  5 mg Oral Daily  . folic acid  1 mg Oral Daily  . heparin  5,000 Units Subcutaneous Q8H  . hydrALAZINE  100 mg Oral TID  . isosorbide mononitrate  120 mg Oral Daily  . levothyroxine  50 mcg Oral Q0600  . omega-3 acid ethyl esters  1,000 mg Oral BID  . sodium bicarbonate  650 mg Oral BID  . sodium chloride flush  3 mL Intravenous Q12H  . tamsulosin  0.4 mg Oral Daily    BMET    Component Value Date/Time   NA 135 02/09/2020 1021   K 4.3 02/09/2020 1021   CL 111 02/09/2020 1021   CO2 16 (L) 02/09/2020 1021   GLUCOSE 155 (H) 02/09/2020 1021   BUN 96 (H) 02/09/2020 1021   CREATININE 3.93 (H) 02/09/2020 1021   CALCIUM 8.3 (L) 02/09/2020 1021   CALCIUM 8.7 12/24/2019 1253   GFRNONAA 14 (L) 02/09/2020 1021   GFRAA 16 (L) 02/09/2020 1021   CBC    Component Value Date/Time   WBC 3.8 (L) 02/09/2020 1021   RBC 2.82 (L) 02/09/2020 1021   HGB 8.3 (L) 02/09/2020 1021   HCT 26.0 (L) 02/09/2020 1021   PLT 119 (L) 02/09/2020 1021   MCV 92.2 02/09/2020 1021   MCH 29.4 02/09/2020 1021   MCHC 31.9 02/09/2020 1021   RDW 13.8 02/09/2020 1021   LYMPHSABS 1.4 02/07/2020 1107   MONOABS 0.4 02/07/2020 1107   EOSABS 0.1 02/07/2020 1107   BASOSABS 0.0 02/07/2020 1107     Assessment/Plan: 1. 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 Presumably due to ischemic ATN in setting of protracted N/V and volume depletion. Unclear if the N/V was due to progressive CKD or was the source of AKI. No evidence of obstruction on abdominal CT scan.  1. Improved BUN/Cr with IVF's and no more n/v.  Improved dysgeusia. 2. Continue to follow renal function and UOP 3. I have consult [facility]VS for access placement and tentatively scheduled for  02/11/20 4. Hopefully we will not need to start dialysis emergently and he will need TDC if BUN/Cr do not significantly improve with IVF's over the next 24-48 hours (unfortunately he came to [facility] too late for IR consult today) 5. No asterixis and nausea and vomiting have stopped but he still has  dysgeusia and anorexia. 2. Protracted N/V/D- currently improved. Antiemetics prn per primary 3. Metabolic acidosis- start sodium bicarb 4. Anemia of CKD - will dose with Aranesp, iron stores at goal 5. Chronic diastolic CHF- currently hypovolemic 6. HTN- stable 7. Cholelithiasis without cholecystitis- solitary gallstone noted 8. Fatty liver 9. CAD- stable 10. BPH- on flomax  Donetta Potts, MD [employer] [phone]

## 2020-02-09 NOTE — Progress Notes (Signed)
Patient stated he would place himself on CPAP when he is ready. RT will monitor as needed.

## 2020-02-09 NOTE — Progress Notes (Signed)
° °  VASCULAR SURGERY ASSESSMENT & PLAN:   STAGE IV CHRONIC KIDNEY DISEASE: Based on his vein map it appears that he might be a candidate for a right brachiocephalic fistula.  I have moved his IV to the left arm.  Dr. Marval Regal will let us know if he needs a tunneled dialysis catheter.  I will tentatively schedule him for surgery on Tuesday.   SUBJECTIVE:   No complaints this morning.  PHYSICAL EXAM:   Vitals:   02/08/20 1708 02/08/20 2027 02/08/20 2300 02/09/20 0442  BP: (!) 132/44 (!) 155/47  (!) 142/53  Pulse: (!) 59 (!) 54 (!) 57 74  Resp: 18 16 18 17   Temp: (!) 97.5 F (36.4 C) 97.6 F (36.4 C)  (!) 97.4 F (36.3 C)  TempSrc: Oral Oral  Oral  SpO2: 96% 95% 95% 96%  Weight:  88.8 kg    Height:       Palpable right radial pulse.  LABS:   Lab Results  Component Value Date   WBC 4.1 02/08/2020   HGB 8.1 (L) 02/08/2020   HCT 25.2 (L) 02/08/2020   MCV 92.0 02/08/2020   PLT 121 (L) 02/08/2020   Lab Results  Component Value Date   CREATININE 4.62 (H) 02/08/2020   Lab Results  Component Value Date   INR 1.1 06/03/2019   PROBLEM LIST:    Principal Problem:   Uremia in the setting of AKI on CKD V Active Problems:   Essential hypertension   Obstructive sleep apnea   S/P CABG x 2   AKI (acute kidney injury) on CKD V   (HFpEF) heart failure with preserved ejection fraction (HCC)   CKD (chronic kidney disease), stage V (HCC)   CURRENT MEDS:    allopurinol  100 mg Oral Daily   amLODipine  10 mg Oral Daily   atorvastatin  80 mg Oral Daily   carvedilol  6.25 mg Oral BID   cholecalciferol  1,000 Units Oral Daily   clopidogrel  75 mg Oral Daily   darbepoetin (ARANESP) injection - NON-DIALYSIS  25 mcg Subcutaneous Q Sat-1800   famotidine  20 mg Oral Daily   finasteride  5 mg Oral Daily   folic acid  1 mg Oral Daily   heparin  5,000 Units Subcutaneous Q8H   hydrALAZINE  100 mg Oral TID   isosorbide mononitrate  120 mg Oral Daily   levothyroxine   50 mcg Oral Q0600   omega-3 acid ethyl esters  1,000 mg Oral BID   sodium bicarbonate  650 mg Oral BID   sodium chloride flush  3 mL Intravenous Q12H   tamsulosin  0.4 mg Oral Daily    Deitra Mayo Office: [phone] 02/09/2020

## 2020-02-09 NOTE — Progress Notes (Signed)
Initial Nutrition Assessment  DOCUMENTATION CODES:   Not applicable  INTERVENTION:  Provide 30 ml Prosource plus po BID, each supplement provides 100 kcal and 15 grams of protein.   Encourage adequate PO intake.   NUTRITION DIAGNOSIS:   Increased nutrient needs related to chronic illness (COPD, CHF) as evidenced by estimated needs.  GOAL:   Patient will meet greater than or equal to 90% of their needs  MONITOR:   PO intake, Supplement acceptance, Skin, Weight trends, Labs, I & O's  REASON FOR ASSESSMENT:   Malnutrition Screening Tool    ASSESSMENT:   74 y.o. male history of prior COPD, coronary bypass grafting twice, history of acid reflux, chronic diastolic dysfunction CHF, hypertension and progressive kidney dysfunction presents with worsening kidney function, wt loss, n/v. Pt with AKI on CKD V, cholelithiasis without cholecystitis.  Pt unavailable during attempted time of contact. Meal completion has been 100%. Per MD, appetite has improved. Per nephrology MD, pt pt BUN/Cr does not improve over the next 24-48 hours with IV fluids, will need TDC. RD to order nutritional supplements to aid in caloric and protein needs.    Unable to complete Nutrition-Focused physical exam at this time. RD working remotely.  Labs and medications reviewed.   Diet Order:   Diet Order            Diet Heart Room service appropriate? Yes; Fluid consistency: Thin  Diet effective now                 EDUCATION NEEDS:   Not appropriate for education at this time  Skin:  Skin Assessment: Reviewed RN Assessment  Last BM:  9/24  Height:   Ht Readings from Last 1 Encounters:  02/07/20 5\' 9"  (1.753 m)    Weight:   Wt Readings from Last 1 Encounters:  02/08/20 88.8 kg    BMI:  Body mass index is 28.91 kg/m.  Estimated Nutritional Needs:   Kcal:  0051-1021  Protein:  90-105 grams  Fluid:  >/= 1.9 L/day  Corrin Parker, MS, RD, LDN RD pager number/after hours weekend  pager number on Amion.

## 2020-02-10 ENCOUNTER — Encounter: Payer: No Typology Code available for payment source | Admitting: Vascular Surgery

## 2020-02-10 LAB — RENAL FUNCTION PANEL
Albumin: 3 g/dL — ABNORMAL LOW (ref 3.5–5.0)
Anion gap: 6 (ref 5–15)
BUN: 86 mg/dL — ABNORMAL HIGH (ref 8–23)
CO2: 18 mmol/L — ABNORMAL LOW (ref 22–32)
Calcium: 8.2 mg/dL — ABNORMAL LOW (ref 8.9–10.3)
Chloride: 113 mmol/L — ABNORMAL HIGH (ref 98–111)
Creatinine, Ser: 3.69 mg/dL — ABNORMAL HIGH (ref 0.61–1.24)
GFR calc Af Amer: 18 mL/min — ABNORMAL LOW (ref >60)
GFR calc non Af Amer: 15 mL/min — ABNORMAL LOW (ref >60)
Glucose, Bld: 154 mg/dL — ABNORMAL HIGH (ref 70–99)
Phosphorus: 4 mg/dL (ref 2.5–4.6)
Potassium: 4.4 mmol/L (ref 3.5–5.1)
Sodium: 137 mmol/L (ref 135–145)

## 2020-02-10 MED ORDER — CEFAZOLIN SODIUM-DEXTROSE 2-4 GM/100ML-% IV SOLN
2.0000 g | INTRAVENOUS | Status: AC
  Administered 2020-02-11: 2 g via INTRAVENOUS

## 2020-02-10 NOTE — Progress Notes (Signed)
Patient ID: [name], male   DOB: [birth year], 74 y.o.   MRN: [id number] [facility] Progress Note   Assessment/ Plan:   1. Acute kidney Injury on chronic kidney disease stage IV versus progression to end-stage renal disease: Suspected to have had hemodynamically mediated acute kidney injury in the setting of nausea/vomiting and volume depletion.  No structural incriminating lesion seen on abdominal scan and BUN/creatinine improved with intravenous fluids along with improvement of GI symptoms.  On schedule for dialysis access tomorrow.  With his improving labs and lack of any florid uremic symptoms, there is no indication to undertake hemodialysis at this time and he will undergo creation of arteriovenous fistula tomorrow.  I will discontinue his intravenous fluids after another 10 hours. 2.  Nausea/vomiting/diarrhea: This has resolved over the last 48 hours or so and he does not report any dysgeusia. 3.  Non-anion gap metabolic acidosis: Likely secondary to chronic kidney disease and possibly exacerbated by GI losses, continue sodium bicarbonate. 4.  Chronic diastolic heart failure: Currently appears to be compensated, will be judicious with intravenous fluids. 5.  Anemia of chronic kidney disease: Continue ESA and follow hemoglobin/hematocrit as an outpatient.  Subjective:   Reports to be feeling well, denies any nausea, vomiting or dysgeusia.  Denies any chest pain or shortness of breath.   Objective:   BP (!) 144/54 (BP Location: Left Arm)   Pulse (!) 52   Temp (!) 97.4 F (36.3 C) (Oral)   Resp 18   Ht 5\' 9"  (1.753 m)   Wt 90.6 kg   SpO2 98%   BMI 29.49 kg/m   Intake/Output Summary (Last 24 hours) at 02/10/2020 1235 Last data filed at 02/10/2020 0900 Gross per 24 hour  Intake 3054.67 ml  Output 1300 ml  Net 1754.67 ml   Weight change: 1.783 kg  Physical Exam: Gen: Comfortably sitting up on the side of his bed. CVS: Pulse regular rhythm, normal rate.  S1 and  S2 normal Resp: Clear to auscultation, no rales/rhonchi Abd: Soft, obese, nontender Ext: No lower extremity edema.  Imaging: No results found.  Labs: BMET Recent Labs  Lab 02/06/20 1314 02/07/20 1107 02/08/20 0402 02/09/20 1021 02/10/20 0833  NA 135 135 136 135 137  K 4.3 4.3 4.4 4.3 4.4  CL 107 107 110 111 113*  CO2 17* 17* 16* 16* 18*  GLUCOSE 117* 109* 87 155* 154*  BUN 122* 124* 109* 96* 86*  CREATININE 5.06* 5.25* 4.62* 3.93* 3.69*  CALCIUM 9.0 9.0 8.6* 8.3* 8.2*  PHOS 5.2*  --  4.6  --  4.0   CBC Recent Labs  Lab 02/06/20 1314 02/06/20 1314 02/06/20 1322 02/07/20 1107 02/08/20 0402 02/09/20 1021  WBC 5.0  --   --  4.5 4.1 3.8*  NEUTROABS  --   --   --  2.6  --   --   HGB 9.8*   < > 9.9* 9.7* 8.1* 8.3*  HCT 30.3*  --   --  29.8* 25.2* 26.0*  MCV 93.8  --   --  94.0 92.0 92.2  PLT 143*  --   --  134* 121* 119*   < > = values in this interval not displayed.    Medications:    . (feeding supplement) PROSource Plus  30 mL Oral BID BM  . allopurinol  100 mg Oral Daily  . amLODipine  10 mg Oral Daily  . atorvastatin  80 mg Oral Daily  . carvedilol  6.25 mg  Oral BID  . cholecalciferol  1,000 Units Oral Daily  . clopidogrel  75 mg Oral Daily  . darbepoetin (ARANESP) injection - NON-DIALYSIS  25 mcg Subcutaneous Q Sat-1800  . famotidine  20 mg Oral Daily  . finasteride  5 mg Oral Daily  . folic acid  1 mg Oral Daily  . heparin  5,000 Units Subcutaneous Q8H  . hydrALAZINE  100 mg Oral TID  . isosorbide mononitrate  120 mg Oral Daily  . levothyroxine  50 mcg Oral Q0600  . omega-3 acid ethyl esters  1,000 mg Oral BID  . sodium bicarbonate  650 mg Oral BID  . sodium chloride flush  3 mL Intravenous Q12H  . tamsulosin  0.4 mg Oral Daily   Elmarie Shiley, MD 02/10/2020, 12:35 PM

## 2020-02-10 NOTE — Progress Notes (Signed)
Progress Note    [name]  JKD:[id number] DOB: 04-Jul-1945  DOA: 02/07/2020 PCP: [facility]    Brief Narrative:     Medical records reviewed and are as summarized below:  [name] is an 74 y.o. male history of prior COPD, coronary bypass grafting twice, history of acid reflux, hypertension and progressive kidney dysfunction.  He was sent from his primary nephrologist stating that his creatinine has gradually and persistently worsened however over the last 6 weeks he has had a fairly significant weight loss associated with nausea and vomiting that he occurs when he eats.  He has been evaluated at the [facility] and found to have a gallstone, referred to general surgery but has not yet seen them.  He initially presented to [facility] he was Transferred to [facility] as patient will not be able to get HD access placed over the weekend at [facility] if HD is required.   Assessment/Plan:   Principal Problem:   Uremia in the setting of AKI on CKD V Active Problems:   Essential hypertension   Obstructive sleep apnea   S/P CABG x 2   AKI (acute kidney injury) on CKD V   (HFpEF) heart failure with preserved ejection fraction (HCC)   CKD (chronic kidney disease), stage V ([place])   AKI on CKD V --- Versus CKD progression -improving symptoms -Suspect worsening renal function is due to dehydration/volume depletion in the setting of persistent nausea vomiting and poor oral intake, compounded by Lasix and losartan use --Creatinine was 3.63 on 12/24/2019, creatinine was up to 4.14 on 12/26/2019 --on 02/06/2020 creatinine was up to 5.06 --renally adjust medications, avoid nephrotoxic agents / dehydration  / hypotension -IV fluids at this time and discontinuation of Lasix --discontinued losartan due to worsening renal function and dehydration -Monitor input/output and daily weight ---UA with proteinuria otherwise unremarkable -Patient was sent from  [facility] in case patient needed HD over the weekend, nephrology consult appreciated: Plan for continued monitoring of renal function, vascular consult for access placement next week -vascular consult: tentative plan for access placement on 9/28  chronic anemia of CKD--- -Hemoglobin is 9.7 which is close to patient's recent baseline -per renal: aranesp  HFpEF--patient with chronic diastolic dysfunction CHF -Echo from January 2021 revealed echo with EF down to 60 to 65% from65 to 09% with diastolic dysfunction -Patient is currently dehydrated/volume depleted -Lasix has been discontinued -IV fluids initiated  CAD-prior CABG in 2008,LHC in 2015 with angioplasty and stenting -Troponin is 31 in the setting of worsening renal function--- patient is chest pain-free currently  -Continue Lipitor, Plavix, Coreg and isosorbide  Hypothyroidism--- stable, continue Levothyroxine 50 mcg daily  HTN-- - restart amlodipine 10 mg daily, along with hydralazine 100 mg 3 times daily, isosorbide 120 mg daily and Coreg 6.25 mg twice daily, losartan and Lasix discontinued due to worsening renal function -May use IV labetalol as needed elevated BP  BPH--stable, continue Flomax  cholelithiasis without cholecystitis--- abdominal ultrasound and CT abdomen and pelvis noted -LFTs are not elevated -Borderline elevation of lipase noted, doubt clinically significant pancreatitis -has outpatient GS follow up  10.6 CM-left renal cyst -defer to renal     Family Communication/Anticipated D/C date and plan/Code Status   DVT prophylaxis: heparin Code Status: Full Code.  Disposition Plan: Status is: Inpatient  Remains inpatient appropriate because:Inpatient level of care appropriate due to severity of illness   Dispo: The patient is from: Home  Anticipated d/c is to: Home              Anticipated d/c date is: 3 days              Patient currently is not medically stable to  d/c.         Medical Consultants:    Renal  Vascular consult  Subjective:   No overnight events   Objective:    Vitals:   02/09/20 2202 02/09/20 2206 02/10/20 0515 02/10/20 0855  BP: 135/63  (!) 133/45 (!) 144/54  Pulse: (!) 59 62 64 (!) 52  Resp: 18 16 17 18   Temp: 97.9 F (36.6 C)  98.2 F (36.8 C) (!) 97.4 F (36.3 C)  TempSrc:   Oral Oral  SpO2: 98% 97% 96% 98%  Weight: 90.6 kg     Height:        Intake/Output Summary (Last 24 hours) at 02/10/2020 1205 Last data filed at 02/10/2020 0900 Gross per 24 hour  Intake 3054.67 ml  Output 1300 ml  Net 1754.67 ml   Filed Weights   02/07/20 2208 02/08/20 2027 02/09/20 2202  Weight: 85.3 kg 88.8 kg 90.6 kg    Exam:  General: Appearance:     Overweight male in no acute distress- up taking a wash up in the sink     Lungs:     respirations unlabored  Heart:    Bradycardic. Normal rhythm. No murmurs, rubs, or gallops.   MS:   All extremities are intact.   Neurologic:   Awake, alert, oriented x 3. No apparent focal neurological           defect.      Data Reviewed:   I have personally reviewed following labs and imaging studies:  Labs: Labs show the following:   Basic Metabolic Panel: Recent Labs  Lab 02/06/20 1314 02/06/20 1314 02/07/20 1107 02/07/20 1107 02/08/20 0402 02/08/20 0402 02/09/20 1021 02/10/20 0833  NA 135  --  135  --  136  --  135 137  K 4.3   < > 4.3   < > 4.4   < > 4.3 4.4  CL 107  --  107  --  110  --  111 113*  CO2 17*  --  17*  --  16*  --  16* 18*  GLUCOSE 117*  --  109*  --  87  --  155* 154*  BUN 122*  --  124*  --  109*  --  96* 86*  CREATININE 5.06*  --  5.25*  --  4.62*  --  3.93* 3.69*  CALCIUM 9.0  --  9.0  --  8.6*  --  8.3* 8.2*  PHOS 5.2*  --   --   --  4.6  --   --  4.0   < > = values in this interval not displayed.   GFR Estimated Creatinine Clearance: 19.8 mL/min (A) (by C-G formula based on SCr of 3.69 mg/dL (H)). Liver Function Tests: Recent Labs  Lab  02/06/20 1314 02/07/20 1107 02/08/20 0402 02/10/20 0833  AST  --  13*  --   --   ALT  --  15  --   --   ALKPHOS  --  62  --   --   BILITOT  --  0.8  --   --   PROT  --  6.8  --   --   ALBUMIN 3.8 3.9 3.2* 3.0*  Recent Labs  Lab 02/07/20 1107  LIPASE 69*   No results for input(s): AMMONIA in the last 168 hours. Coagulation profile No results for input(s): INR, PROTIME in the last 168 hours.  CBC: Recent Labs  Lab 02/06/20 1314 02/06/20 1322 02/07/20 1107 02/08/20 0402 02/09/20 1021  WBC 5.0  --  4.5 4.1 3.8*  NEUTROABS  --   --  2.6  --   --   HGB 9.8* 9.9* 9.7* 8.1* 8.3*  HCT 30.3*  --  29.8* 25.2* 26.0*  MCV 93.8  --  94.0 92.0 92.2  PLT 143*  --  134* 121* 119*   Cardiac Enzymes: No results for input(s): CKTOTAL, CKMB, CKMBINDEX, TROPONINI in the last 168 hours. BNP (last 3 results) No results for input(s): PROBNP in the last 8760 hours. CBG: No results for input(s): GLUCAP in the last 168 hours. D-Dimer: No results for input(s): DDIMER in the last 72 hours. Hgb A1c: No results for input(s): HGBA1C in the last 72 hours. Lipid Profile: Recent Labs    02/07/20 2130  CHOL 136  HDL 21*  LDLCALC 88  TRIG 136  CHOLHDL 6.5   Thyroid function studies: No results for input(s): TSH, T4TOTAL, T3FREE, THYROIDAB in the last 72 hours.  Invalid input(s): FREET3 Anemia work up: Recent Labs    02/09/20 1021  FERRITIN 743*   Sepsis Labs: Recent Labs  Lab 02/06/20 1314 02/07/20 1107 02/08/20 0402 02/09/20 1021  WBC 5.0 4.5 4.1 3.8*  LATICACIDVEN  --  0.8  --   --     Microbiology Recent Results (from the past 240 hour(s))  Urine Culture     Status: None   Collection Time: 02/07/20 12:14 PM   Specimen: Urine, Clean Catch  Result Value Ref Range Status   Specimen Description   Final    URINE, CLEAN CATCH Performed at [facility], [address] [place], [place] [address]    Special Requests   Final    NONE Performed at [facility], [address], [place], [place] [address]    Culture   Final    NO GROWTH Performed at [facility], [place] [address] [place], [place] [address]    Report Status 02/08/2020 FINAL  Final  Respiratory Panel by RT PCR (Flu A&B, Covid) - Nasopharyngeal Swab     Status: None   Collection Time: 02/07/20  1:20 PM   Specimen: Nasopharyngeal Swab  Result Value Ref Range Status   SARS Coronavirus 2 by RT PCR NEGATIVE NEGATIVE Final    Comment: (NOTE) SARS-CoV-2 target nucleic acids are NOT DETECTED.  The SARS-CoV-2 RNA is generally detectable in upper respiratoy specimens during the acute phase of infection. The lowest concentration of SARS-CoV-2 viral copies this assay can detect is 131 copies/mL. A negative result does not preclude SARS-Cov-2 infection and should not be used as the sole basis for treatment or other patient management decisions. A negative result may occur with  improper specimen collection/handling, submission of specimen other than nasopharyngeal swab, presence of viral mutation(s) within the areas targeted by this assay, and inadequate number of viral copies (<131 copies/mL). A negative result must be combined with clinical observations, patient history, and epidemiological information. The expected result is Negative.  Fact Sheet for Patients:  PinkCheek.be  Fact Sheet for Healthcare Providers:  GravelBags.it  This test is no t yet approved or cleared by the [place] FDA and  has been authorized for detection and/or diagnosis of SARS-CoV-2 by FDA  under an Emergency Use Authorization (EUA). This EUA will remain  in effect (meaning this test can be used) for the duration of the COVID-19 declaration under Section 564(b)(1) of the Act, 21 U.S.C. section 360bbb-3(b)(1), unless the authorization is terminated or revoked sooner.     Influenza A by PCR NEGATIVE NEGATIVE Final   Influenza B by PCR NEGATIVE NEGATIVE  Final    Comment: (NOTE) The Xpert Xpress SARS-CoV-2/FLU/RSV assay is intended as an aid in  the diagnosis of influenza from Nasopharyngeal swab specimens and  should not be used as a sole basis for treatment. Nasal washings and  aspirates are unacceptable for Xpert Xpress SARS-CoV-2/FLU/RSV  testing.  Fact Sheet for Patients: PinkCheek.be  Fact Sheet for Healthcare Providers: GravelBags.it  This test is not yet approved or cleared by the [place] FDA and  has been authorized for detection and/or diagnosis of SARS-CoV-2 by  FDA under an Emergency Use Authorization (EUA). This EUA will remain  in effect (meaning this test can be used) for the duration of the  Covid-19 declaration under Section 564(b)(1) of the Act, 21  U.S.C. section 360bbb-3(b)(1), unless the authorization is  terminated or revoked. Performed at [facility], [address] [place], [place] [address]     Procedures and diagnostic studies:  No results found.  Medications:   . (feeding supplement) PROSource Plus  30 mL Oral BID BM  . allopurinol  100 mg Oral Daily  . amLODipine  10 mg Oral Daily  . atorvastatin  80 mg Oral Daily  . carvedilol  6.25 mg Oral BID  . cholecalciferol  1,000 Units Oral Daily  . clopidogrel  75 mg Oral Daily  . darbepoetin (ARANESP) injection - NON-DIALYSIS  25 mcg Subcutaneous Q Sat-1800  . famotidine  20 mg Oral Daily  . finasteride  5 mg Oral Daily  . folic acid  1 mg Oral Daily  . heparin  5,000 Units Subcutaneous Q8H  . hydrALAZINE  100 mg Oral TID  . isosorbide mononitrate  120 mg Oral Daily  . levothyroxine  50 mcg Oral Q0600  . omega-3 acid ethyl esters  1,000 mg Oral BID  . sodium bicarbonate  650 mg Oral BID  . sodium chloride flush  3 mL Intravenous Q12H  . tamsulosin  0.4 mg Oral Daily   Continuous Infusions: . sodium chloride 75 mL/hr at 02/10/20 1000  . sodium chloride    . [START ON 02/11/2020]   ceFAZolin (ANCEF) IV       LOS: 3 days   Geradine Girt  [facility]   How to contact the [facility] Attending or Consulting provider [place] or covering provider during after hours [place], for this patient?  1. Check the care team in [facility] and look for a) attending/consulting [facility] provider listed and b) the [facility] team listed 2. Log into [email or web address] and use [place]'s universal password to access. If you do not have the password, please contact the hospital operator. 3. Locate the [facility] provider you are looking for under [facility] and page to a number that you can be directly reached. 4. If you still have difficulty reaching the provider, please page the [facility] (Director on Call) for the Hospitalists listed on amion for assistance.  02/10/2020, 12:05 PM

## 2020-02-10 NOTE — H&P (View-Only) (Signed)
   VASCULAR SURGERY ASSESSMENT & PLAN:   STAGE IV CHRONIC KIDNEY DISEASE: Based on his vein map it appears that he might be a candidate for a right brachiocephalic fistula.  Otherwise he would require placement of an AV graft. I have moved his IV to the left arm.  Dr. Marval Regal will let us know if he needs a tunneled dialysis catheter.   He is scheduled for surgery tomorrow.  Please do not do hemodialysis in the morning.   I have written his preop orders.  SUBJECTIVE:   No complaints.  PHYSICAL EXAM:   Vitals:   02/09/20 1612 02/09/20 2202 02/09/20 2206 02/10/20 0515  BP: (!) 133/54 135/63  (!) 133/45  Pulse:  (!) 59 62 64  Resp:  18 16 17   Temp:  97.9 F (36.6 C)  98.2 F (36.8 C)  TempSrc:    Oral  SpO2:  98% 97% 96%  Weight:  90.6 kg    Height:       Palpable right radial pulse.  LABS:   Lab Results  Component Value Date   WBC 3.8 (L) 02/09/2020   HGB 8.3 (L) 02/09/2020   HCT 26.0 (L) 02/09/2020   MCV 92.2 02/09/2020   PLT 119 (L) 02/09/2020   Lab Results  Component Value Date   CREATININE 3.93 (H) 02/09/2020    PROBLEM LIST:    Principal Problem:   Uremia in the setting of AKI on CKD V Active Problems:   Essential hypertension   Obstructive sleep apnea   S/P CABG x 2   AKI (acute kidney injury) on CKD V   (HFpEF) heart failure with preserved ejection fraction (HCC)   CKD (chronic kidney disease), stage V ([place])   CURRENT MEDS:   . (feeding supplement) PROSource Plus  30 mL Oral BID BM  . allopurinol  100 mg Oral Daily  . amLODipine  10 mg Oral Daily  . atorvastatin  80 mg Oral Daily  . carvedilol  6.25 mg Oral BID  . cholecalciferol  1,000 Units Oral Daily  . clopidogrel  75 mg Oral Daily  . darbepoetin (ARANESP) injection - NON-DIALYSIS  25 mcg Subcutaneous Q Sat-1800  . famotidine  20 mg Oral Daily  . finasteride  5 mg Oral Daily  . folic acid  1 mg Oral Daily  . heparin  5,000 Units Subcutaneous Q8H  . hydrALAZINE  100 mg Oral TID  .  isosorbide mononitrate  120 mg Oral Daily  . levothyroxine  50 mcg Oral Q0600  . omega-3 acid ethyl esters  1,000 mg Oral BID  . sodium bicarbonate  650 mg Oral BID  . sodium chloride flush  3 mL Intravenous Q12H  . tamsulosin  0.4 mg Oral Daily    Deitra Mayo Office: [phone] 02/10/2020

## 2020-02-10 NOTE — Progress Notes (Signed)
   VASCULAR SURGERY ASSESSMENT & PLAN:   STAGE IV CHRONIC KIDNEY DISEASE: Based on his vein map it appears that he might be a candidate for a right brachiocephalic fistula.  Otherwise he would require placement of an AV graft. I have moved his IV to the left arm.  Dr. Marval Regal will let us know if he needs a tunneled dialysis catheter.   He is scheduled for surgery tomorrow.  Please do not do hemodialysis in the morning.   I have written his preop orders.  SUBJECTIVE:   No complaints.  PHYSICAL EXAM:   Vitals:   02/09/20 1612 02/09/20 2202 02/09/20 2206 02/10/20 0515  BP: (!) 133/54 135/63  (!) 133/45  Pulse:  (!) 59 62 64  Resp:  18 16 17   Temp:  97.9 F (36.6 C)  98.2 F (36.8 C)  TempSrc:    Oral  SpO2:  98% 97% 96%  Weight:  90.6 kg    Height:       Palpable right radial pulse.  LABS:   Lab Results  Component Value Date   WBC 3.8 (L) 02/09/2020   HGB 8.3 (L) 02/09/2020   HCT 26.0 (L) 02/09/2020   MCV 92.2 02/09/2020   PLT 119 (L) 02/09/2020   Lab Results  Component Value Date   CREATININE 3.93 (H) 02/09/2020    PROBLEM LIST:    Principal Problem:   Uremia in the setting of AKI on CKD V Active Problems:   Essential hypertension   Obstructive sleep apnea   S/P CABG x 2   AKI (acute kidney injury) on CKD V   (HFpEF) heart failure with preserved ejection fraction (HCC)   CKD (chronic kidney disease), stage V ([place])   CURRENT MEDS:   . (feeding supplement) PROSource Plus  30 mL Oral BID BM  . allopurinol  100 mg Oral Daily  . amLODipine  10 mg Oral Daily  . atorvastatin  80 mg Oral Daily  . carvedilol  6.25 mg Oral BID  . cholecalciferol  1,000 Units Oral Daily  . clopidogrel  75 mg Oral Daily  . darbepoetin (ARANESP) injection - NON-DIALYSIS  25 mcg Subcutaneous Q Sat-1800  . famotidine  20 mg Oral Daily  . finasteride  5 mg Oral Daily  . folic acid  1 mg Oral Daily  . heparin  5,000 Units Subcutaneous Q8H  . hydrALAZINE  100 mg Oral TID  .  isosorbide mononitrate  120 mg Oral Daily  . levothyroxine  50 mcg Oral Q0600  . omega-3 acid ethyl esters  1,000 mg Oral BID  . sodium bicarbonate  650 mg Oral BID  . sodium chloride flush  3 mL Intravenous Q12H  . tamsulosin  0.4 mg Oral Daily    Deitra Mayo Office: [phone] 02/10/2020

## 2020-02-10 NOTE — Progress Notes (Signed)
Pt places self on/off our cpap as needed.  RT will continue to monitor.

## 2020-02-11 ENCOUNTER — Inpatient Hospital Stay (HOSPITAL_COMMUNITY): Payer: No Typology Code available for payment source | Admitting: Certified Registered"

## 2020-02-11 ENCOUNTER — Encounter (HOSPITAL_COMMUNITY)
Admission: EM | Disposition: A | Discharge status: Home / Self Care | Payer: Self-pay | Source: Home / Self Care | Attending: Internal Medicine

## 2020-02-11 DIAGNOSIS — N184 Chronic kidney disease, stage 4 (severe): Secondary | ICD-10-CM

## 2020-02-11 HISTORY — PX: AV FISTULA PLACEMENT: SHX1204

## 2020-02-11 LAB — CBC
HCT: 24.3 % — ABNORMAL LOW (ref 39.0–52.0)
Hemoglobin: 7.6 g/dL — ABNORMAL LOW (ref 13.0–17.0)
MCH: 29.6 pg (ref 26.0–34.0)
MCHC: 31.3 g/dL (ref 30.0–36.0)
MCV: 94.6 fL (ref 80.0–100.0)
Platelets: 109 10*3/uL — ABNORMAL LOW (ref 150–400)
RBC: 2.57 MIL/uL — ABNORMAL LOW (ref 4.22–5.81)
RDW: 14.1 % (ref 11.5–15.5)
WBC: 3.9 10*3/uL — ABNORMAL LOW (ref 4.0–10.5)
nRBC: 0 % (ref 0.0–0.2)

## 2020-02-11 LAB — RENAL FUNCTION PANEL
Albumin: 3 g/dL — ABNORMAL LOW (ref 3.5–5.0)
Anion gap: 7 (ref 5–15)
BUN: 87 mg/dL — ABNORMAL HIGH (ref 8–23)
CO2: 17 mmol/L — ABNORMAL LOW (ref 22–32)
Calcium: 8.4 mg/dL — ABNORMAL LOW (ref 8.9–10.3)
Chloride: 112 mmol/L — ABNORMAL HIGH (ref 98–111)
Creatinine, Ser: 3.44 mg/dL — ABNORMAL HIGH (ref 0.61–1.24)
GFR calc Af Amer: 19 mL/min — ABNORMAL LOW (ref >60)
GFR calc non Af Amer: 17 mL/min — ABNORMAL LOW (ref >60)
Glucose, Bld: 94 mg/dL (ref 70–99)
Phosphorus: 4.2 mg/dL (ref 2.5–4.6)
Potassium: 5.1 mmol/L (ref 3.5–5.1)
Sodium: 136 mmol/L (ref 135–145)

## 2020-02-11 LAB — SURGICAL PCR SCREEN
MRSA, PCR: NEGATIVE
Staphylococcus aureus: NEGATIVE

## 2020-02-11 LAB — TYPE AND SCREEN
ABO/RH(D): A POS
Antibody Screen: NEGATIVE

## 2020-02-11 SURGERY — ARTERIOVENOUS (AV) FISTULA CREATION
Anesthesia: Monitor Anesthesia Care | Site: Arm Upper | Laterality: Right

## 2020-02-11 MED ORDER — PHENYLEPHRINE HCL (PRESSORS) 10 MG/ML IV SOLN
INTRAVENOUS | Status: DC | PRN
  Administered 2020-02-11: 80 ug via INTRAVENOUS
  Administered 2020-02-11: 160 ug via INTRAVENOUS

## 2020-02-11 MED ORDER — LIDOCAINE-EPINEPHRINE (PF) 1 %-1:200000 IJ SOLN
INTRAMUSCULAR | Status: DC | PRN
  Administered 2020-02-11: 30 mL

## 2020-02-11 MED ORDER — OXYCODONE-ACETAMINOPHEN 5-325 MG PO TABS: 1.0000 | ORAL_TABLET | Freq: Four times a day (QID) | ORAL | Status: DC | PRN

## 2020-02-11 MED ORDER — OXYCODONE-ACETAMINOPHEN 5-325 MG PO TABS: 1.0000 | ORAL_TABLET | Freq: Four times a day (QID) | ORAL | 0 refills | Status: DC | PRN

## 2020-02-11 MED ORDER — EPHEDRINE 5 MG/ML INJ
INTRAVENOUS | Status: AC
  Filled 2020-02-11: qty 10

## 2020-02-11 MED ORDER — PHENYLEPHRINE 40 MCG/ML (10ML) SYRINGE FOR IV PUSH (FOR BLOOD PRESSURE SUPPORT)
PREFILLED_SYRINGE | INTRAVENOUS | Status: AC
  Filled 2020-02-11: qty 10

## 2020-02-11 MED ORDER — SODIUM BICARBONATE 650 MG PO TABS
650.0000 mg | ORAL_TABLET | Freq: Two times a day (BID) | ORAL | 0 refills | Status: DC
Start: 2020-02-11 — End: 2020-02-11

## 2020-02-11 MED ORDER — SODIUM CHLORIDE 0.9 % IV SOLN
INTRAVENOUS | Status: AC
  Filled 2020-02-11: qty 1.2

## 2020-02-11 MED ORDER — EPHEDRINE SULFATE 50 MG/ML IJ SOLN
INTRAMUSCULAR | Status: DC | PRN
  Administered 2020-02-11: 2.5 mg via INTRAVENOUS
  Administered 2020-02-11: 10 mg via INTRAVENOUS
  Administered 2020-02-11: 5 mg via INTRAVENOUS

## 2020-02-11 MED ORDER — PROTAMINE SULFATE 10 MG/ML IV SOLN
INTRAVENOUS | Status: DC | PRN
  Administered 2020-02-11: 50 mg via INTRAVENOUS

## 2020-02-11 MED ORDER — LIDOCAINE-EPINEPHRINE (PF) 1 %-1:200000 IJ SOLN
INTRAMUSCULAR | Status: AC
  Filled 2020-02-11: qty 30

## 2020-02-11 MED ORDER — HEPARIN SODIUM (PORCINE) 1000 UNIT/ML IJ SOLN
INTRAMUSCULAR | Status: AC
  Filled 2020-02-11: qty 1

## 2020-02-11 MED ORDER — CEFAZOLIN SODIUM-DEXTROSE 2-3 GM-%(50ML) IV SOLR: INTRAVENOUS | Status: DC | PRN

## 2020-02-11 MED ORDER — SODIUM CHLORIDE 0.9 % IV SOLN
INTRAVENOUS | Status: DC | PRN
  Administered 2020-02-11: 500 mL

## 2020-02-11 MED ORDER — PROPOFOL 10 MG/ML IV BOLUS
INTRAVENOUS | Status: DC | PRN
  Administered 2020-02-11 (×2): 20 mg via INTRAVENOUS

## 2020-02-11 MED ORDER — DEXAMETHASONE SODIUM PHOSPHATE 10 MG/ML IJ SOLN
INTRAMUSCULAR | Status: AC
  Filled 2020-02-11: qty 2

## 2020-02-11 MED ORDER — PROPOFOL 500 MG/50ML IV EMUL
INTRAVENOUS | Status: DC | PRN
  Administered 2020-02-11: 50 ug/kg/min via INTRAVENOUS

## 2020-02-11 MED ORDER — LIDOCAINE HCL (PF) 1 % IJ SOLN
INTRAMUSCULAR | Status: AC
  Filled 2020-02-11: qty 30

## 2020-02-11 MED ORDER — FENTANYL CITRATE (PF) 100 MCG/2ML IJ SOLN
INTRAMUSCULAR | Status: AC
  Filled 2020-02-11: qty 2

## 2020-02-11 MED ORDER — SODIUM CHLORIDE 0.9 % IV SOLN: INTRAVENOUS | Status: DC

## 2020-02-11 MED ORDER — CHLORHEXIDINE GLUCONATE 0.12 % MT SOLN
15.0000 mL | Freq: Once | OROMUCOSAL | Status: AC
  Administered 2020-02-11: 15 mL via OROMUCOSAL

## 2020-02-11 MED ORDER — FUROSEMIDE 40 MG PO TABS: 20.0000 mg | ORAL_TABLET | Freq: Two times a day (BID) | ORAL | Status: DC

## 2020-02-11 MED ORDER — SODIUM BICARBONATE 650 MG PO TABS
650.0000 mg | ORAL_TABLET | Freq: Two times a day (BID) | ORAL | 0 refills | Status: DC
Start: 2020-02-11 — End: 2020-05-08

## 2020-02-11 MED ORDER — ONDANSETRON HCL 4 MG/2ML IJ SOLN
INTRAMUSCULAR | Status: DC | PRN
  Administered 2020-02-11: 4 mg via INTRAVENOUS

## 2020-02-11 MED ORDER — 0.9 % SODIUM CHLORIDE (POUR BTL) OPTIME
TOPICAL | Status: DC | PRN
  Administered 2020-02-11: 1000 mL

## 2020-02-11 MED ORDER — PROTAMINE SULFATE 10 MG/ML IV SOLN
INTRAVENOUS | Status: AC
  Filled 2020-02-11: qty 5

## 2020-02-11 MED ORDER — HEPARIN SODIUM (PORCINE) 1000 UNIT/ML IJ SOLN
INTRAMUSCULAR | Status: DC | PRN
  Administered 2020-02-11: 9000 [IU] via INTRAVENOUS

## 2020-02-11 SURGICAL SUPPLY — 37 items
ARMBAND PINK RESTRICT EXTREMIT (MISCELLANEOUS) ×6 IMPLANT
CANISTER SUCT 3000ML PPV (MISCELLANEOUS) ×3 IMPLANT
CANNULA VESSEL 3MM 2 BLNT TIP (CANNULA) ×3 IMPLANT
CLIP VESOCCLUDE MED 6/CT (CLIP) ×3 IMPLANT
CLIP VESOCCLUDE SM WIDE 6/CT (CLIP) ×3 IMPLANT
COVER PROBE W GEL 5X96 (DRAPES) IMPLANT
COVER WAND RF STERILE (DRAPES) ×1 IMPLANT
DECANTER SPIKE VIAL GLASS SM (MISCELLANEOUS) ×3 IMPLANT
DERMABOND ADVANCED (GAUZE/BANDAGES/DRESSINGS) ×2
DERMABOND ADVANCED .7 DNX12 (GAUZE/BANDAGES/DRESSINGS) ×1 IMPLANT
ELECT REM PT RETURN 9FT ADLT (ELECTROSURGICAL) ×3
ELECTRODE REM PT RTRN 9FT ADLT (ELECTROSURGICAL) ×1 IMPLANT
GLOVE BIO SURGEON STRL SZ 6.5 (GLOVE) ×2 IMPLANT
GLOVE BIO SURGEON STRL SZ7.5 (GLOVE) ×7 IMPLANT
GLOVE BIO SURGEONS STRL SZ 6.5 (GLOVE) ×2
GLOVE BIOGEL PI IND STRL 6.5 (GLOVE) IMPLANT
GLOVE BIOGEL PI IND STRL 7.5 (GLOVE) IMPLANT
GLOVE BIOGEL PI IND STRL 8 (GLOVE) ×1 IMPLANT
GLOVE BIOGEL PI INDICATOR 6.5 (GLOVE) ×6
GLOVE BIOGEL PI INDICATOR 7.5 (GLOVE) ×2
GLOVE BIOGEL PI INDICATOR 8 (GLOVE) ×2
GOWN STRL REUS W/ TWL LRG LVL3 (GOWN DISPOSABLE) ×3 IMPLANT
GOWN STRL REUS W/TWL LRG LVL3 (GOWN DISPOSABLE) ×9
KIT BASIN OR (CUSTOM PROCEDURE TRAY) ×3 IMPLANT
KIT TURNOVER KIT B (KITS) ×3 IMPLANT
NS IRRIG 1000ML POUR BTL (IV SOLUTION) ×3 IMPLANT
PACK CV ACCESS (CUSTOM PROCEDURE TRAY) ×3 IMPLANT
PAD ARMBOARD 7.5X6 YLW CONV (MISCELLANEOUS) ×6 IMPLANT
SPONGE SURGIFOAM ABS GEL 100 (HEMOSTASIS) IMPLANT
SUT PROLENE 6 0 BV (SUTURE) ×3 IMPLANT
SUT VIC AB 3-0 SH 27 (SUTURE) ×3
SUT VIC AB 3-0 SH 27X BRD (SUTURE) ×1 IMPLANT
SUT VICRYL 4-0 PS2 18IN ABS (SUTURE) ×3 IMPLANT
SYR BULB IRRIG 60ML STRL (SYRINGE) ×2 IMPLANT
TOWEL GREEN STERILE (TOWEL DISPOSABLE) ×3 IMPLANT
UNDERPAD 30X36 HEAVY ABSORB (UNDERPADS AND DIAPERS) ×3 IMPLANT
WATER STERILE IRR 1000ML POUR (IV SOLUTION) ×3 IMPLANT

## 2020-02-11 NOTE — Interval H&P Note (Signed)
History and Physical Interval Note:  02/11/2020 11:11 AM  [name]  has presented today for surgery, with the diagnosis of CHRONIC KIDNEY DISEASE STAGE V.  The various methods of treatment have been discussed with the patient and family. After consideration of risks, benefits and other options for treatment, the patient has consented to  Procedure(s): ARTERIOVENOUS (AV) FISTULA VERSUS ARTERIOVENOUS GRAFT (Right) as a surgical intervention.  The patient's history has been reviewed, patient examined, no change in status, stable for surgery.  I have reviewed the patient's chart and labs.  Questions were answered to the patient's satisfaction.     Deitra Mayo

## 2020-02-11 NOTE — Progress Notes (Signed)
Patient ID: [name], male   DOB: September 09, 1945, 74 y.o.   MRN: [id number] [facility] Progress Note   Assessment/ Plan:   1. Acute kidney Injury on chronic kidney disease stage IV versus progression to end-stage renal disease: Suspected to have had hemodynamically mediated acute kidney injury in the setting of nausea/vomiting and volume depletion.  No structural incriminating lesion seen on abdominal scan and BUN/creatinine improved with intravenous fluids along with improvement of GI symptoms.  On schedule for dialysis access surgery today and can possibly be discharged home later today if stable or tomorrow after verification of labs to continue follow-up with his outpatient nephrologist (Dr. Theador Hawthorne). 2.  Nausea/vomiting/diarrhea: This has resolved over the last 48 hours or so and he does not report any dysgeusia. 3.  Non-anion gap metabolic acidosis: Likely secondary to chronic kidney disease and possibly exacerbated by GI losses, continue sodium bicarbonate. 4.  Chronic diastolic heart failure: Currently appears to be compensated, will be judicious with intravenous fluids. 5.  Anemia of chronic kidney disease: Continue ESA and follow hemoglobin/hematocrit as an outpatient.  Subjective:   Denies any acute events overnight and awaiting access surgery in about 2 hours.   Objective:   BP (!) 134/52 (BP Location: Left Arm)   Pulse 67   Temp 98.3 F (36.8 C)   Resp 19   Ht 5\' 9"  (1.753 m)   Wt 90.6 kg   SpO2 94%   BMI 29.49 kg/m   Intake/Output Summary (Last 24 hours) at 02/11/2020 0953 Last data filed at 02/11/2020 0819 Gross per 24 hour  Intake 1957.19 ml  Output 1300 ml  Net 657.19 ml   Weight change:   Physical Exam: Gen: Comfortably sitting up on the side of his bed, watching television. CVS: Pulse regular rhythm, normal rate.  S1 and S2 normal Resp: Clear to auscultation, no rales/rhonchi Abd: Soft, obese, nontender Ext: No lower extremity  edema.  Imaging: No results found.  Labs: BMET Recent Labs  Lab 02/06/20 1314 02/07/20 1107 02/08/20 0402 02/09/20 1021 02/10/20 0833 02/11/20 0849  NA 135 135 136 135 137 136  K 4.3 4.3 4.4 4.3 4.4 5.1  CL 107 107 110 111 113* 112*  CO2 17* 17* 16* 16* 18* 17*  GLUCOSE 117* 109* 87 155* 154* 94  BUN 122* 124* 109* 96* 86* 87*  CREATININE 5.06* 5.25* 4.62* 3.93* 3.69* 3.44*  CALCIUM 9.0 9.0 8.6* 8.3* 8.2* 8.4*  PHOS 5.2*  --  4.6  --  4.0 4.2   CBC Recent Labs  Lab 02/07/20 1107 02/08/20 0402 02/09/20 1021 02/11/20 0849  WBC 4.5 4.1 3.8* 3.9*  NEUTROABS 2.6  --   --   --   HGB 9.7* 8.1* 8.3* 7.6*  HCT 29.8* 25.2* 26.0* 24.3*  MCV 94.0 92.0 92.2 94.6  PLT 134* 121* 119* 109*    Medications:    . (feeding supplement) PROSource Plus  30 mL Oral BID BM  . allopurinol  100 mg Oral Daily  . amLODipine  10 mg Oral Daily  . atorvastatin  80 mg Oral Daily  . carvedilol  6.25 mg Oral BID  . cholecalciferol  1,000 Units Oral Daily  . clopidogrel  75 mg Oral Daily  . darbepoetin (ARANESP) injection - NON-DIALYSIS  25 mcg Subcutaneous Q Sat-1800  . famotidine  20 mg Oral Daily  . finasteride  5 mg Oral Daily  . folic acid  1 mg Oral Daily  . heparin  5,000 Units Subcutaneous Q8H  .  hydrALAZINE  100 mg Oral TID  . isosorbide mononitrate  120 mg Oral Daily  . levothyroxine  50 mcg Oral Q0600  . omega-3 acid ethyl esters  1,000 mg Oral BID  . sodium bicarbonate  650 mg Oral BID  . sodium chloride flush  3 mL Intravenous Q12H  . tamsulosin  0.4 mg Oral Daily   Elmarie Shiley, MD 02/11/2020, 9:53 AM

## 2020-02-11 NOTE — Anesthesia Procedure Notes (Signed)
Procedure Name: MAC Date/Time: 02/11/2020 12:19 PM Performed by: Cleda Daub, CRNA Pre-anesthesia Checklist: Patient identified, Emergency Drugs available, Suction available and Patient being monitored Patient Re-evaluated:Patient Re-evaluated prior to induction Oxygen Delivery Method: Simple face mask

## 2020-02-11 NOTE — Discharge Summary (Signed)
Physician Discharge Summary  [name] YYT:[id number] DOB: 1945-11-26 DOA: 02/07/2020  PCP: [facility]  Admit date: 02/07/2020 Discharge date: 02/11/2020  Admitted From: home Discharge disposition: Home   Recommendations for Outpatient Follow-Up:   1. BMP 1 week 2. Patient restarted on Lasix 20 mg twice daily: Deferred nephrology to titrate up as needed 3. Follow-up with vascular   Discharge Diagnosis:   Principal Problem:   Uremia in the setting of AKI on CKD V Active Problems:   Essential hypertension   Obstructive sleep apnea   S/P CABG x 2   AKI (acute kidney injury) on CKD V   (HFpEF) heart failure with preserved ejection fraction (HCC)   CKD (chronic kidney disease), stage V ([place])    Discharge Condition: Improved.  Diet recommendation: Renal  Wound care: Per vascular  Code status: Full.   History of Present Illness:   [name] is an 74 y.o. male history of prior COPD, coronary bypass grafting twice, history of acid reflux, hypertension and progressive kidney dysfunction. He was sent from his primary nephrologist stating that his creatinine has gradually and persistently worsened however over the last 6 weeks he has had a fairly significant weight loss associated with nausea and vomiting that he occurs when he eats. He has been evaluated at the [facility] and found to have a gallstone, referred to general surgery but has not yet seen them. He initially presented to [facility] he was Transferred to [facility] as patient will not be able to get HD access placed over the weekend at [facility] if HD is required.   Hospital Course by Problem:   AKI on CKD V --- Versus CKD progression -improving symptoms -Suspect worsening renal function is due to dehydration/volume depletion in the setting of persistent nausea vomiting and poor oral intake, compounded by Lasix and losartan use --Creatinine was 3.63 on  12/24/2019,creatinine was up to 4.14 on 12/26/2019 --on 02/06/2020 creatinine was up to 5.06 --renally adjust medications, avoid nephrotoxic agents / dehydration / hypotension -IV fluids at this time and discontinuation of Lasix --discontinued losartan due to worsening renal function and dehydration -Monitor input/output and daily weight ---UA with proteinuria otherwise unremarkable -Patient was sent from [facility] in case patient needed HD over the weekend, nephrology consult appreciated: Plan for continued monitoring of renal function, vascular consult for access placement next week -vascular consult: s/p access placement on 9/28: Right brachiocephalic AV fistula  chronic anemia of CKD--- -Hemoglobin is 9.7 which is close to patient's recent baseline -per renal: aranesp  HFpEF--patient with chronic diastolic dysfunction CHF -Echo from January 2021 revealedecho with EF down to 60 to 65% from65 to 27% with diastolic dysfunction -stable  CAD-prior CABG in 2008,LHC in 2015 with angioplasty and stenting -Troponin is 31 in the setting of worsening renal function---patient is chest pain-free currently  -ContinueLipitor, Plavix, Coreg and isosorbide  Hypothyroidism---stable, continue Levothyroxine 50 mcg daily  HTN-- - restart amlodipine 10 mg daily, along with hydralazine 100 mg 3 times daily, isosorbide 120 mg daily and Coreg 6.25 mg twice daily -hold losartan -resume lasix at last dose: 20mg  BID  BPH--stable, continue Flomax  cholelithiasis without cholecystitis---abdominal ultrasound and CT abdomen and pelvis noted -LFTs are not elevated -Borderline elevation of lipase noted, doubt clinically significant pancreatitis -has outpatient GS follow up  10.6 CM-left renal cyst -defer to renal to follow outpatient     Medical Consultants:    Vascular Renal  Discharge  Exam:   Vitals:   02/11/20 1340 02/11/20 1402  BP:  (!) 148/50  Pulse:  65   Resp:  16  Temp: 98 F (36.7 C) 97.6 F (36.4 C)  SpO2:  96%   Vitals:   02/11/20 1320 02/11/20 1335 02/11/20 1340 02/11/20 1402  BP: (!) 157/60 (!) 163/51  (!) 148/50  Pulse: 74 74  65  Resp: 11 18  16   Temp: 98 F (36.7 C)  98 F (36.7 C) 97.6 F (36.4 C)  TempSrc:    Oral  SpO2: 96% 96%  96%  Weight:      Height:        General exam: Appears calm and comfortable.    The results of significant diagnostics from this hospitalization (including imaging, microbiology, ancillary and laboratory) are listed below for reference.     Procedures and Diagnostic Studies:   CT ABDOMEN PELVIS WO CONTRAST  Result Date: 02/07/2020 CLINICAL DATA:  Acute generalized abdominal pain, weight loss. EXAM: CT ABDOMEN AND PELVIS WITHOUT CONTRAST TECHNIQUE: Multidetector CT imaging of the abdomen and pelvis was performed following the standard protocol without IV contrast. COMPARISON:  None. FINDINGS: Lower chest: No acute abnormality. Hepatobiliary: Solitary gallstone is noted. No biliary dilatation is noted. Liver is unremarkable on these unenhanced images. Pancreas: Unremarkable. No pancreatic ductal dilatation or surrounding inflammatory changes. Spleen: Normal in size without focal abnormality. 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 Left adrenal gland is unremarkable. 10.6 cm cyst is seen arising from lower pole of left kidney. Smaller cysts are seen involving both kidneys. No hydronephrosis or renal obstruction is noted. No renal or ureteral calculi are noted. Urinary bladder is decompressed. Stomach/Bowel: The stomach appears normal. Status post appendectomy. There is no evidence of bowel obstruction or inflammation. Sigmoid diverticulosis is noted without inflammation. Vascular/Lymphatic: Aortic atherosclerosis. No enlarged abdominal or pelvic lymph nodes. Reproductive: Prostate is unremarkable. Other: No abdominal wall hernia or abnormality. No abdominopelvic ascites.  Musculoskeletal: No acute or significant osseous findings. IMPRESSION: 1. Solitary gallstone. 2. 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 3. 10.6 cm left renal cyst. 4. Sigmoid diverticulosis without inflammation. 5. No acute abnormality seen in the abdomen or pelvis. 6. Aortic atherosclerosis. Aortic Atherosclerosis (ICD10-I70.0). Electronically Signed   By: Marijo Conception M.D.   On: 02/07/2020 14:49   US Abdomen Limited  Result Date: 02/07/2020 CLINICAL DATA:  Right upper quadrant pain EXAM: ULTRASOUND ABDOMEN LIMITED RIGHT UPPER QUADRANT COMPARISON:  None. FINDINGS: Gallbladder: Mobile 12 mm gallstone. No wall thickening or sonographic Murphy sign. Common bile duct: Diameter: Normal caliber, 4 mm. Liver: Increased echotexture compatible with fatty infiltration. No focal abnormality or biliary ductal dilatation. Portal vein is patent on color Doppler imaging with normal direction of blood flow towards the liver. Other: None. IMPRESSION: Cholelithiasis.  No sonographic evidence of acute cholecystitis. Diffuse fatty infiltration of the liver. Electronically Signed   By: Rolm Baptise M.D.   On: 02/07/2020 11:22   DG Chest Port 1 View  Result Date: 02/07/2020 CLINICAL DATA:  Chills, vomiting EXAM: PORTABLE CHEST 1 VIEW COMPARISON:  05/16/2019 FINDINGS: Prior CABG. Heart is normal size. Lungs are clear. No effusions. No acute bony abnormality. IMPRESSION: No active disease. Electronically Signed   By: Rolm Baptise M.D.   On: 02/07/2020 11:00     Labs:   Basic Metabolic Panel: Recent Labs  Lab 02/06/20 1314 02/06/20 1314 02/07/20 1107 02/07/20 1107 02/08/20 0402 02/08/20 0402 02/09/20 1021 02/09/20 1021 02/10/20 0833 02/11/20 0849  NA 135   < >  135  --  136  --  135  --  137 136  K 4.3   < > 4.3   < > 4.4   < > 4.3   < > 4.4 5.1  CL 107   < > 107  --  110  --  111  --  113* 112*  CO2 17*   < > 17*  --  16*  --  16*  --  18* 17*  GLUCOSE 117*   < > 109*  --  87  --  155*  --  154* 94  BUN 122*    < > 124*  --  109*  --  96*  --  86* 87*  CREATININE 5.06*   < > 5.25*  --  4.62*  --  3.93*  --  3.69* 3.44*  CALCIUM 9.0   < > 9.0  --  8.6*  --  8.3*  --  8.2* 8.4*  PHOS 5.2*  --   --   --  4.6  --   --   --  4.0 4.2   < > = values in this interval not displayed.   GFR Estimated Creatinine Clearance: 21.3 mL/min (A) (by C-G formula based on SCr of 3.44 mg/dL (H)). Liver Function Tests: Recent Labs  Lab 02/06/20 1314 02/07/20 1107 02/08/20 0402 02/10/20 0833 02/11/20 0849  AST  --  13*  --   --   --   ALT  --  15  --   --   --   ALKPHOS  --  62  --   --   --   BILITOT  --  0.8  --   --   --   PROT  --  6.8  --   --   --   ALBUMIN 3.8 3.9 3.2* 3.0* 3.0*   Recent Labs  Lab 02/07/20 1107  LIPASE 69*   No results for input(s): AMMONIA in the last 168 hours. Coagulation profile No results for input(s): INR, PROTIME in the last 168 hours.  CBC: Recent Labs  Lab 02/06/20 1314 02/06/20 1314 02/06/20 1322 02/07/20 1107 02/08/20 0402 02/09/20 1021 02/11/20 0849  WBC 5.0  --   --  4.5 4.1 3.8* 3.9*  NEUTROABS  --   --   --  2.6  --   --   --   HGB 9.8*   < > 9.9* 9.7* 8.1* 8.3* 7.6*  HCT 30.3*  --   --  29.8* 25.2* 26.0* 24.3*  MCV 93.8  --   --  94.0 92.0 92.2 94.6  PLT 143*  --   --  134* 121* 119* 109*   < > = values in this interval not displayed.   Cardiac Enzymes: No results for input(s): CKTOTAL, CKMB, CKMBINDEX, TROPONINI in the last 168 hours. BNP: Invalid input(s): POCBNP CBG: No results for input(s): GLUCAP in the last 168 hours. D-Dimer No results for input(s): DDIMER in the last 72 hours. Hgb A1c No results for input(s): HGBA1C in the last 72 hours. Lipid Profile No results for input(s): CHOL, HDL, LDLCALC, TRIG, CHOLHDL, LDLDIRECT in the last 72 hours. Thyroid function studies No results for input(s): TSH, T4TOTAL, T3FREE, THYROIDAB in the last 72 hours.  Invalid input(s): FREET3 Anemia work up Recent Labs    02/09/20 [place]*    Microbiology Recent Results (from the past 240 hour(s))  Urine Culture     Status: None   Collection Time: 02/07/20  12:14 PM   Specimen: Urine, Clean Catch  Result Value Ref Range Status   Specimen Description   Final    URINE, CLEAN CATCH Performed at [facility], [address], [place], [place] [address]    Special Requests   Final    NONE Performed at [facility], [address] [place], [place] [address]    Culture   Final    NO GROWTH Performed at [facility], [place] [address], [place], [place] [address]    Report Status 02/08/2020 FINAL  Final  Respiratory Panel by RT PCR (Flu A&B, Covid) - Nasopharyngeal Swab     Status: None   Collection Time: 02/07/20  1:20 PM   Specimen: Nasopharyngeal Swab  Result Value Ref Range Status   SARS Coronavirus 2 by RT PCR NEGATIVE NEGATIVE Final    Comment: (NOTE) SARS-CoV-2 target nucleic acids are NOT DETECTED.  The SARS-CoV-2 RNA is generally detectable in upper respiratoy specimens during the acute phase of infection. The lowest concentration of SARS-CoV-2 viral copies this assay can detect is 131 copies/mL. A negative result does not preclude SARS-Cov-2 infection and should not be used as the sole basis for treatment or other patient management decisions. A negative result may occur with  improper specimen collection/handling, submission of specimen other than nasopharyngeal swab, presence of viral mutation(s) within the areas targeted by this assay, and inadequate number of viral copies (<131 copies/mL). A negative result must be combined with clinical observations, patient history, and epidemiological information. The expected result is Negative.  Fact Sheet for Patients:  PinkCheek.be  Fact Sheet for Healthcare Providers:  GravelBags.it  This test is no t yet approved or cleared by the [place] FDA and  has been authorized for detection and/or  diagnosis of SARS-CoV-2 by FDA under an Emergency Use Authorization (EUA). This EUA will remain  in effect (meaning this test can be used) for the duration of the COVID-19 declaration under Section 564(b)(1) of the Act, 21 U.S.C. section 360bbb-3(b)(1), unless the authorization is terminated or revoked sooner.     Influenza A by PCR NEGATIVE NEGATIVE Final   Influenza B by PCR NEGATIVE NEGATIVE Final    Comment: (NOTE) The Xpert Xpress SARS-CoV-2/FLU/RSV assay is intended as an aid in  the diagnosis of influenza from Nasopharyngeal swab specimens and  should not be used as a sole basis for treatment. Nasal washings and  aspirates are unacceptable for Xpert Xpress SARS-CoV-2/FLU/RSV  testing.  Fact Sheet for Patients: PinkCheek.be  Fact Sheet for Healthcare Providers: GravelBags.it  This test is not yet approved or cleared by the [place] FDA and  has been authorized for detection and/or diagnosis of SARS-CoV-2 by  FDA under an Emergency Use Authorization (EUA). This EUA will remain  in effect (meaning this test can be used) for the duration of the  Covid-19 declaration under Section 564(b)(1) of the Act, 21  U.S.C. section 360bbb-3(b)(1), unless the authorization is  terminated or revoked. Performed at [facility], [address], [place], [place] [address]   Surgical pcr screen     Status: None   Collection Time: 02/10/20 10:40 PM   Specimen: Nasal Mucosa; Nasal Swab  Result Value Ref Range Status   MRSA, PCR NEGATIVE NEGATIVE Final   Staphylococcus aureus NEGATIVE NEGATIVE Final    Comment: (NOTE) The Xpert SA Assay (FDA approved for NASAL specimens in patients 72 years of age and older), is one component of a comprehensive surveillance program. It is not intended  to diagnose infection nor to guide or monitor treatment. Performed at [facility], [place] [address], [place], [place] [address]       Discharge Instructions:   Discharge Instructions    Call MD for:  difficulty breathing, headache or visual disturbances   Complete by: As directed    Call MD for:  redness, tenderness, or signs of infection (pain, swelling, redness, odor or green/yellow discharge around incision site)   Complete by: As directed    Call MD for:  temperature >100.4   Complete by: As directed    Discharge instructions   Complete by: As directed    Renal diet BMP 1 week   Increase activity slowly   Complete by: As directed    No wound care   Complete by: As directed      Allergies as of 02/11/2020      Reactions   Cardizem Cd [diltiazem Hcl Er Beads] Palpitations   "Makes heart skip"      Medication List    STOP taking these medications   losartan 25 MG tablet Commonly known as: COZAAR     TAKE these medications   acetaminophen 325 MG tablet Commonly known as: TYLENOL Take 2 tablets (650 mg total) by mouth every 6 (six) hours as needed for mild pain.   allopurinol 100 MG tablet Commonly known as: ZYLOPRIM Take 100 mg by mouth daily.   amLODipine 10 MG tablet Commonly known as: NORVASC Take 1 tablet (10 mg total) by mouth daily.   atorvastatin 80 MG tablet Commonly known as: LIPITOR Take 80 mg by mouth daily.   carvedilol 6.25 MG tablet Commonly known as: Coreg Take 1 tablet (6.25 mg total) by mouth 2 (two) times daily.   clopidogrel 75 MG tablet Commonly known as: PLAVIX Take 75 mg by mouth daily.   epoetin alfa 10000 UNIT/ML injection Commonly known as: EPOGEN Inject 10,000 Units into the vein every 14 (fourteen) days.   famotidine 40 MG tablet Commonly known as: PEPCID Take by mouth.   finasteride 5 MG tablet Commonly known as: PROSCAR Take 5 mg by mouth daily.   folic acid 1 MG tablet Commonly known as: FOLVITE Take by mouth.   furosemide 40 MG tablet Commonly known as: LASIX Take 0.5 tablets (20 mg total) by mouth 2 (two) times daily.   hydrALAZINE  100 MG tablet Commonly known as: APRESOLINE Take 1 tablet (100 mg total) by mouth 3 (three) times daily.   isosorbide mononitrate 120 MG 24 hr tablet Commonly known as: IMDUR Take 1 tablet (120 mg total) by mouth daily.   levothyroxine 50 MCG tablet Commonly known as: SYNTHROID Take 50 mcg by mouth daily before breakfast.   nitroGLYCERIN 0.4 MG SL tablet Commonly known as: NITROSTAT Place 0.4 mg under the tongue every 5 (five) minutes as needed for chest pain.   oxyCODONE-acetaminophen 5-325 MG tablet Commonly known as: Percocet Take 1 tablet by mouth every 6 (six) hours as needed for severe pain.   RA Fish Oil 1000 MG Caps Take 2,000 mg by mouth 2 (two) times daily.   sodium bicarbonate 650 MG tablet Take 1 tablet (650 mg total) by mouth 2 (two) times daily.   tamsulosin 0.4 MG Caps capsule Commonly known as: FLOMAX Take 0.4 mg by mouth daily.   Vitamin D (Cholecalciferol) 25 MCG (1000 UT) Caps Take 1 capsule by mouth daily.       Follow-up Information    [facility] In 6  weeks.   Specialty: Vascular Surgery Why: Office will call you to arrange your appt (sent) Contact information: [facility] Follow up in 1 week(s).   Contact information: [place] [address] [phone]        Liana Gerold, MD Follow up in 1 week(s).   Specialty: Nephrology Contact information: [address] [place] [address] [phone]                Time coordinating discharge: 35 minutes  Signed:  Geradine Girt DO  [facility] 02/11/2020, 2:22 PM

## 2020-02-11 NOTE — Anesthesia Preprocedure Evaluation (Addendum)
Anesthesia Evaluation  Patient identified by MRN, date of birth, ID band Patient awake    Reviewed: Allergy & Precautions, NPO status , Patient's Chart, lab work & pertinent test results, reviewed documented beta blocker date and time   History of Anesthesia Complications (+) PONV and history of anesthetic complications  Airway Mallampati: II  TM Distance: >3 FB Neck ROM: Full    Dental  (+) Dental Advisory Given, Lower Dentures, Upper Dentures   Pulmonary sleep apnea , COPD, former smoker,    Pulmonary exam normal        Cardiovascular hypertension, Pt. on medications and Pt. on home beta blockers + CAD, + Past MI, + Cardiac Stents and + CABG  Normal cardiovascular exam+ dysrhythmias + Valvular Problems/Murmurs AS    '21 TTE - EF 60 to 65%. Moderately increased left ventricular hypertrophy. Grade I diastolic dysfunction (impaired relaxation). LA was severely dilated. Trivial MR. Mild AI. Mild to moderate aortic valve stenosis. Aortic valve area, by VTI measures 1.40 cm. Aortic valve mean gradient measures 17.3 mmHg. Pulmonic valve regurgitation is trivial. Moderately elevated pulmonary artery systolic pressure. Estimated right ventricular systolic pressure is moderately elevated at 53.4 mmHg.     Neuro/Psych PSYCHIATRIC DISORDERS Anxiety CVA    GI/Hepatic Neg liver ROS, hiatal hernia, GERD  Controlled and Medicated,  Endo/Other  Hypothyroidism   Renal/GU ESRFRenal disease     Musculoskeletal  (+) Arthritis ,   Abdominal   Peds  Hematology  (+) anemia ,  On Plavix Thrombocytopenia, Plt 119k    Anesthesia Other Findings Covid test negative   Reproductive/Obstetrics                            Anesthesia Physical Anesthesia Plan  ASA: IV  Anesthesia Plan: MAC   Post-op Pain Management:    Induction: Intravenous  PONV Risk Score and Plan: 2 and Propofol infusion and Treatment may vary  due to age or medical condition  Airway Management Planned: Natural Airway and Simple Face Mask  Additional Equipment: None  Intra-op Plan:   Post-operative Plan:   Informed Consent: I have reviewed the patients History and Physical, chart, labs and discussed the procedure including the risks, benefits and alternatives for the proposed anesthesia with the patient or authorized representative who has indicated his/her understanding and acceptance.       Plan Discussed with: CRNA and Anesthesiologist  Anesthesia Plan Comments:        Anesthesia Quick Evaluation

## 2020-02-11 NOTE — Op Note (Signed)
    NAME: [name]    MRN: [id number] DOB: 1946/01/17    DATE OF OPERATION: 02/11/2020  PREOP DIAGNOSIS:    Stage IV chronic kidney disease  POSTOP DIAGNOSIS:    Same  PROCEDURE:    Right brachiocephalic AV fistula  SURGEON: Judeth Cornfield. Scot Dock, MD  ASSIST: Leontine Locket, PA  ANESTHESIA: Local with sedation  EBL: Minimal  INDICATIONS:    [name] is a 74 y.o. male who presents for access.  He is not yet on dialysis.  He has had his left radial artery previously harvested so we elected to place an access in the right arm.  FINDINGS:   3-1/2 mm upper arm cephalic vein  TECHNIQUE:   The patient was taken to the operating room and sedated by anesthesia.  The right upper extremity was prepped and draped in usual sterile fashion.  After the skin was anesthetized with constant lidocaine a transverse incision was made at the antecubital level.  Here the cephalic vein was dissected free.  Branches were divided between clips and 3-0 silk ties.  The vein was ligated distally and irrigated up nicely with heparinized saline.  The patient was heparinized.  The brachial artery was dissected free beneath the fascia.  The artery was clamped proximally and distally and a longitudinal arteriotomy was made.  The vein was sewn end-to-side to the artery using continuous 6-0 Prolene suture.  At the completion there was an excellent thrill in the fistula.  There was a palpable radial pulse.  The heparin was partially reversed with protamine.  The wound was then closed with a deep layer of 3-0 Vicryl and the skin closed with 4-0 Vicryl.  Dermabond was applied.  The patient tolerated the procedure well was transferred to the recovery room in stable condition.  All needle and sponge counts were correct.  Given the complexity of the case a first assistant was necessary in order to expedient the procedure and safely perform the technical aspects of the operation.  Deitra Mayo, MD,  FACS [facility]  DATE OF DICTATION:   02/11/2020

## 2020-02-11 NOTE — Plan of Care (Signed)
  Problem: Education: Goal: Knowledge of General Education information will improve Description Including pain rating scale, medication(s)/side effects and non-pharmacologic comfort measures Outcome: Progressing   

## 2020-02-11 NOTE — Anesthesia Postprocedure Evaluation (Signed)
Anesthesia Post Note  Patient: [name]  Procedure(s) Performed: RIGHT ARM BRACHIOCEPHALIC ARTERIOVENOUS (AV) FISTULA (Right Arm Upper)     Patient location during evaluation: PACU Anesthesia Type: MAC Level of consciousness: awake and alert Pain management: pain level controlled Vital Signs Assessment: post-procedure vital signs reviewed and stable Respiratory status: spontaneous breathing, nonlabored ventilation and respiratory function stable Cardiovascular status: stable and blood pressure returned to baseline Anesthetic complications: no   No complications documented.  Last Vitals:  Vitals:   02/11/20 1340 02/11/20 1402  BP:  (!) 148/50  Pulse:  65  Resp:  16  Temp: 36.7 C 36.4 C  SpO2:  96%    Last Pain:  Vitals:   02/11/20 1402  TempSrc: Oral  PainSc: 0-No pain                 Audry Pili

## 2020-02-11 NOTE — Progress Notes (Signed)
PDMP aware website reviewed.   Percocet 5/325 one q6h prn pain #8 (eight) sent to pharmacy electronically.   Leontine Locket, [facility] 02/11/2020 1:50 PM

## 2020-02-11 NOTE — Discharge Instructions (Signed)
   [facility]  Discharge Instructions  AV Fistula or Graft Surgery for Dialysis Access  Please refer to the following instructions for your post-procedure care. Your surgeon or physician assistant will discuss any changes with you.  Activity  You may drive the day following your surgery, if you are comfortable and no longer taking prescription pain medication. Resume full activity as the soreness in your incision resolves.  Bathing/Showering  You may shower after you go home. Keep your incision dry for 48 hours. Do not soak in a bathtub, hot tub, or swim until the incision heals completely. You may not shower if you have a hemodialysis catheter.  Incision Care  Clean your incision with mild soap and water after 48 hours. Pat the area dry with a clean towel. You do not need a bandage unless otherwise instructed. Do not apply any ointments or creams to your incision. You may have skin glue on your incision. Do not peel it off. It will come off on its own in about one week. Your arm may swell a bit after surgery. To reduce swelling use pillows to elevate your arm so it is above your heart. Your doctor will tell you if you need to lightly wrap your arm with an ACE bandage.  Diet  Resume your normal diet. There are not special food restrictions following this procedure. In order to heal from your surgery, it is CRITICAL to get adequate nutrition. Your body requires vitamins, minerals, and protein. Vegetables are the best source of vitamins and minerals. Vegetables also provide the perfect balance of protein. Processed food has little nutritional value, so try to avoid this.  Medications  Resume taking all of your medications. If your incision is causing pain, you may take over-the counter pain relievers such as acetaminophen (Tylenol). If you were prescribed a stronger pain medication, please be aware these medications can cause nausea and constipation. Prevent  nausea by taking the medication with a snack or meal. Avoid constipation by drinking plenty of fluids and eating foods with high amount of fiber, such as fruits, vegetables, and grains.  Do not take Tylenol if you are taking prescription pain medications.  Follow up Your surgeon may want to see you in the office following your access surgery. If so, this will be arranged at the time of your surgery.  Please call us immediately for any of the following conditions:  . Increased pain, redness, drainage (pus) from your incision site . Fever of 101 degrees or higher . Severe or worsening pain at your incision site . Hand pain or numbness. .  Reduce your risk of vascular disease:  . Stop smoking. If you would like help, call [facility] at 1-800-QUIT-NOW (936)412-8245) or [place] at [phone]  . Manage your cholesterol . Maintain a desired weight . Control your diabetes . Keep your blood pressure down  Dialysis  It will take several weeks to several months for your new dialysis access to be ready for use. Your surgeon will determine when it is okay to use it. Your nephrologist will continue to direct your dialysis. You can continue to use your Permcath until your new access is ready for use.   02/11/2020 [name] [id number] March 28, 1946  Surgeon(s): Angelia Mould, MD  Procedure(s): RIGHT ARM BRACHIOCEPHALIC ARTERIOVENOUS (AV) FISTULA  x Do not stick fistula for 12 weeks    If you have any questions, please call the office at [phone].

## 2020-02-11 NOTE — Transfer of Care (Signed)
Immediate Anesthesia Transfer of Care Note  Patient: RENE SIZELOVE  Procedure(s) Performed: RIGHT ARM BRACHIOCEPHALIC ARTERIOVENOUS (AV) FISTULA (Right Arm Upper)  Patient Location: PACU  Anesthesia Type:MAC  Level of Consciousness: drowsy and patient cooperative  Airway & Oxygen Therapy: Patient Spontanous Breathing  Post-op Assessment: Report given to RN and Post -op Vital signs reviewed and stable  Post vital signs: Reviewed and stable  Last Vitals:  Vitals Value Taken Time  BP 157/60 02/11/20 1320  Temp    Pulse 74 02/11/20 1320  Resp 11 02/11/20 1320  SpO2 96 % 02/11/20 1320  Vitals shown include unvalidated device data.  Last Pain:  Vitals:   02/11/20 0833  TempSrc:   PainSc: 0-No pain         Complications: No complications documented.

## 2020-02-11 NOTE — Progress Notes (Signed)
[name] to be discharged Home per MD order. Discussed prescriptions and follow up appointments with the patient. Prescriptions given and medication list explained in detail. Patient verbalized understanding.  Skin clean, dry and intact without evidence of skin break down, no evidence of skin tears noted. IV catheter discontinued intact. Site without signs and symptoms of complications. Dressing and pressure applied. Pt denies pain at the site currently. No complaints noted.  Patient free of lines, drains, and wounds.   An After Visit Summary (AVS) was printed and given to the patient. Patient escorted via wheelchair, and discharged home via private auto.  [name], RN

## 2020-02-12 ENCOUNTER — Encounter (HOSPITAL_COMMUNITY): Payer: Self-pay | Admitting: Vascular Surgery

## 2020-02-20 ENCOUNTER — Encounter (HOSPITAL_COMMUNITY): Payer: Self-pay

## 2020-02-20 ENCOUNTER — Encounter (HOSPITAL_COMMUNITY)
Admission: RE | Admit: 2020-02-20 | Discharge: 2020-02-20 | Disposition: A | Discharge status: Home / Self Care | Payer: No Typology Code available for payment source | Source: Ambulatory Visit | Attending: Nephrology | Admitting: Nephrology

## 2020-02-20 ENCOUNTER — Other Ambulatory Visit: Payer: Self-pay

## 2020-02-20 DIAGNOSIS — N184 Chronic kidney disease, stage 4 (severe): Secondary | ICD-10-CM | POA: Insufficient documentation

## 2020-02-20 DIAGNOSIS — D631 Anemia in chronic kidney disease: Secondary | ICD-10-CM | POA: Insufficient documentation

## 2020-02-20 LAB — POCT HEMOGLOBIN-HEMACUE: Hemoglobin: 7.1 g/dL — ABNORMAL LOW (ref 13.0–17.0)

## 2020-02-20 MED ORDER — EPOETIN ALFA 10000 UNIT/ML IJ SOLN
10000.0000 [IU] | Freq: Once | INTRAMUSCULAR | Status: AC
  Administered 2020-02-20: 10000 [IU] via SUBCUTANEOUS
  Filled 2020-02-20: qty 1

## 2020-02-26 ENCOUNTER — Other Ambulatory Visit (HOSPITAL_COMMUNITY)
Admission: RE | Admit: 2020-02-26 | Discharge: 2020-02-26 | Disposition: A | Discharge status: Home / Self Care | Payer: No Typology Code available for payment source | Source: Ambulatory Visit | Attending: Nephrology | Admitting: Nephrology

## 2020-02-26 ENCOUNTER — Other Ambulatory Visit: Payer: Self-pay

## 2020-02-26 DIAGNOSIS — E875 Hyperkalemia: Secondary | ICD-10-CM | POA: Diagnosis present

## 2020-02-26 DIAGNOSIS — N170 Acute kidney failure with tubular necrosis: Secondary | ICD-10-CM | POA: Diagnosis present

## 2020-02-26 LAB — RENAL FUNCTION PANEL
Albumin: 3.6 g/dL (ref 3.5–5.0)
Anion gap: 11 (ref 5–15)
BUN: 67 mg/dL — ABNORMAL HIGH (ref 8–23)
CO2: 24 mmol/L (ref 22–32)
Calcium: 8.7 mg/dL — ABNORMAL LOW (ref 8.9–10.3)
Chloride: 102 mmol/L (ref 98–111)
Creatinine, Ser: 3.91 mg/dL — ABNORMAL HIGH (ref 0.61–1.24)
GFR, Estimated: 14 mL/min — ABNORMAL LOW (ref >60)
Glucose, Bld: 136 mg/dL — ABNORMAL HIGH (ref 70–99)
Phosphorus: 3.6 mg/dL (ref 2.5–4.6)
Potassium: 3.8 mmol/L (ref 3.5–5.1)
Sodium: 137 mmol/L (ref 135–145)

## 2020-03-05 ENCOUNTER — Encounter (HOSPITAL_COMMUNITY)
Admission: RE | Admit: 2020-03-05 | Discharge: 2020-03-05 | Disposition: A | Discharge status: Home / Self Care | Payer: No Typology Code available for payment source | Source: Ambulatory Visit | Attending: Nephrology | Admitting: Nephrology

## 2020-03-05 ENCOUNTER — Encounter (HOSPITAL_COMMUNITY): Payer: Self-pay

## 2020-03-05 ENCOUNTER — Other Ambulatory Visit: Payer: Self-pay

## 2020-03-05 DIAGNOSIS — N184 Chronic kidney disease, stage 4 (severe): Secondary | ICD-10-CM | POA: Diagnosis not present

## 2020-03-05 LAB — POCT HEMOGLOBIN-HEMACUE: Hemoglobin: 7.1 g/dL — ABNORMAL LOW (ref 13.0–17.0)

## 2020-03-05 MED ORDER — EPOETIN ALFA 10000 UNIT/ML IJ SOLN
10000.0000 [IU] | Freq: Once | INTRAMUSCULAR | Status: AC
  Administered 2020-03-05: 10000 [IU] via SUBCUTANEOUS
  Filled 2020-03-05: qty 1

## 2020-03-12 ENCOUNTER — Encounter (HOSPITAL_COMMUNITY)
Admission: RE | Admit: 2020-03-12 | Discharge: 2020-03-12 | Disposition: A | Discharge status: Home / Self Care | Payer: No Typology Code available for payment source | Source: Ambulatory Visit | Attending: Nephrology | Admitting: Nephrology

## 2020-03-12 ENCOUNTER — Other Ambulatory Visit: Payer: Self-pay

## 2020-03-12 ENCOUNTER — Encounter (HOSPITAL_COMMUNITY): Payer: Self-pay

## 2020-03-12 ENCOUNTER — Other Ambulatory Visit (HOSPITAL_COMMUNITY)
Admission: RE | Admit: 2020-03-12 | Discharge: 2020-03-12 | Disposition: A | Discharge status: Home / Self Care | Payer: No Typology Code available for payment source | Source: Ambulatory Visit | Attending: Nephrology | Admitting: Nephrology

## 2020-03-12 DIAGNOSIS — N184 Chronic kidney disease, stage 4 (severe): Secondary | ICD-10-CM | POA: Diagnosis not present

## 2020-03-12 LAB — CBC
HCT: 22.1 % — ABNORMAL LOW (ref 39.0–52.0)
Hemoglobin: 7.1 g/dL — ABNORMAL LOW (ref 13.0–17.0)
MCH: 30.9 pg (ref 26.0–34.0)
MCHC: 32.1 g/dL (ref 30.0–36.0)
MCV: 96.1 fL (ref 80.0–100.0)
Platelets: 146 10*3/uL — ABNORMAL LOW (ref 150–400)
RBC: 2.3 MIL/uL — ABNORMAL LOW (ref 4.22–5.81)
RDW: 14.9 % (ref 11.5–15.5)
WBC: 4.7 10*3/uL (ref 4.0–10.5)
nRBC: 0 % (ref 0.0–0.2)

## 2020-03-12 LAB — RENAL FUNCTION PANEL
Albumin: 3.6 g/dL (ref 3.5–5.0)
Anion gap: 11 (ref 5–15)
BUN: 87 mg/dL — ABNORMAL HIGH (ref 8–23)
CO2: 23 mmol/L (ref 22–32)
Calcium: 8.7 mg/dL — ABNORMAL LOW (ref 8.9–10.3)
Chloride: 102 mmol/L (ref 98–111)
Creatinine, Ser: 3.96 mg/dL — ABNORMAL HIGH (ref 0.61–1.24)
GFR, Estimated: 15 mL/min — ABNORMAL LOW (ref >60)
Glucose, Bld: 106 mg/dL — ABNORMAL HIGH (ref 70–99)
Phosphorus: 4.1 mg/dL (ref 2.5–4.6)
Potassium: 3.3 mmol/L — ABNORMAL LOW (ref 3.5–5.1)
Sodium: 136 mmol/L (ref 135–145)

## 2020-03-12 LAB — POCT HEMOGLOBIN-HEMACUE: Hemoglobin: 7.1 g/dL — ABNORMAL LOW (ref 13.0–17.0)

## 2020-03-12 LAB — PROTEIN / CREATININE RATIO, URINE
Creatinine, Urine: 110.99 mg/dL
Protein Creatinine Ratio: 1.19 mg/mg{Cre} — ABNORMAL HIGH (ref 0.00–0.15)
Total Protein, Urine: 132 mg/dL

## 2020-03-12 LAB — PROTEIN, TOTAL: Total Protein: 6.5 g/dL (ref 6.5–8.1)

## 2020-03-12 MED ORDER — EPOETIN ALFA 10000 UNIT/ML IJ SOLN
10000.0000 [IU] | Freq: Once | INTRAMUSCULAR | Status: AC
  Administered 2020-03-12: 10000 [IU] via SUBCUTANEOUS
  Filled 2020-03-12: qty 1

## 2020-03-13 ENCOUNTER — Other Ambulatory Visit: Payer: Self-pay

## 2020-03-13 DIAGNOSIS — Z992 Dependence on renal dialysis: Secondary | ICD-10-CM

## 2020-03-13 DIAGNOSIS — N186 End stage renal disease: Secondary | ICD-10-CM

## 2020-03-13 LAB — PROTEIN ELECTROPHORESIS, SERUM
A/G Ratio: 1.3 (ref 0.7–1.7)
Albumin ELP: 3.4 g/dL (ref 2.9–4.4)
Alpha-1-Globulin: 0.3 g/dL (ref 0.0–0.4)
Alpha-2-Globulin: 0.6 g/dL (ref 0.4–1.0)
Beta Globulin: 0.8 g/dL (ref 0.7–1.3)
Gamma Globulin: 0.9 g/dL (ref 0.4–1.8)
Globulin, Total: 2.6 g/dL (ref 2.2–3.9)
Total Protein ELP: 6 g/dL (ref 6.0–8.5)

## 2020-03-13 LAB — KAPPA/LAMBDA LIGHT CHAINS
Kappa free light chain: 138.7 mg/L — ABNORMAL HIGH (ref 3.3–19.4)
Kappa, lambda light chain ratio: 2.15 — ABNORMAL HIGH (ref 0.26–1.65)
Lambda free light chains: 64.5 mg/L — ABNORMAL HIGH (ref 5.7–26.3)

## 2020-03-13 NOTE — Addendum Note (Signed)
Addended byDoylene Bode on: 03/13/2020 12:58 PM   Modules accepted: Orders

## 2020-03-19 ENCOUNTER — Encounter (HOSPITAL_COMMUNITY)
Admission: RE | Admit: 2020-03-19 | Discharge: 2020-03-19 | Disposition: A | Discharge status: Home / Self Care | Payer: No Typology Code available for payment source | Source: Ambulatory Visit | Attending: Nephrology | Admitting: Nephrology

## 2020-03-19 ENCOUNTER — Encounter (HOSPITAL_COMMUNITY): Payer: Self-pay

## 2020-03-19 ENCOUNTER — Other Ambulatory Visit: Payer: Self-pay

## 2020-03-19 DIAGNOSIS — D631 Anemia in chronic kidney disease: Secondary | ICD-10-CM | POA: Insufficient documentation

## 2020-03-19 DIAGNOSIS — N184 Chronic kidney disease, stage 4 (severe): Secondary | ICD-10-CM | POA: Insufficient documentation

## 2020-03-19 LAB — POCT HEMOGLOBIN-HEMACUE: Hemoglobin: 7.4 g/dL — ABNORMAL LOW (ref 13.0–17.0)

## 2020-03-19 MED ORDER — EPOETIN ALFA 10000 UNIT/ML IJ SOLN
10000.0000 [IU] | Freq: Once | INTRAMUSCULAR | Status: AC
  Administered 2020-03-19: 10000 [IU] via SUBCUTANEOUS
  Filled 2020-03-19: qty 1

## 2020-03-25 ENCOUNTER — Ambulatory Visit (HOSPITAL_COMMUNITY)
Admission: RE | Admit: 2020-03-25 | Discharge: 2020-03-25 | Disposition: A | Discharge status: Home / Self Care | Payer: Medicare Other | Source: Ambulatory Visit | Attending: Physician Assistant | Admitting: Physician Assistant

## 2020-03-25 ENCOUNTER — Other Ambulatory Visit: Payer: Self-pay

## 2020-03-25 ENCOUNTER — Ambulatory Visit (INDEPENDENT_AMBULATORY_CARE_PROVIDER_SITE_OTHER): Payer: Self-pay | Admitting: Physician Assistant

## 2020-03-25 VITALS — BP 151/54 | HR 63 | Temp 98.6°F | Resp 20 | Ht 69.0 in | Wt 201.6 lb

## 2020-03-25 DIAGNOSIS — N186 End stage renal disease: Secondary | ICD-10-CM

## 2020-03-25 DIAGNOSIS — Z992 Dependence on renal dialysis: Secondary | ICD-10-CM | POA: Diagnosis not present

## 2020-03-25 NOTE — Progress Notes (Signed)
    Postoperative Access Visit   History of Present Illness   [name] is a 74 y.o. year old male who presents for postoperative follow-up for: right brachiocephalic arteriovenous fistula by Dr. Scot Dock 02/11/20.  The patient's wounds are healed.  The patient denies steal symptoms.  He is not yet on HD.  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   Physical Examination   Vitals:   03/25/20 1417  BP: (!) 151/54  Pulse: 63  Resp: 20  Temp: 98.6 F (37 C)  TempSrc: Temporal  SpO2: 96%  Weight: 201 lb 9.6 oz (91.4 kg)  Height: 5\' 9"  (1.753 m)   Body mass index is 29.77 kg/m.  right arm Incision is healed, palpable radial pulse, hand grip is 5/5, sensation in digits is intact, palpable thrill, bruit can be auscultated     Medical Decision Making   [name] is a 74 y.o. year old male who presents s/p right brachiocephalic arteriovenous fistula   Patent R brachiocephalic fistula without signs or symptoms of steal syndrome  The patient's access will be ready for use 05/05/20  Please notify our office if HD is anticipated prior to the above date  The patient may follow up on a prn basis   Dagoberto Ligas PA-C [facility] [place] Office: [phone]  Clinic MD: Scot Dock

## 2020-03-26 ENCOUNTER — Encounter (HOSPITAL_COMMUNITY)
Admission: RE | Admit: 2020-03-26 | Discharge: 2020-03-26 | Disposition: A | Discharge status: Home / Self Care | Payer: No Typology Code available for payment source | Source: Ambulatory Visit | Attending: Nephrology | Admitting: Nephrology

## 2020-03-26 ENCOUNTER — Encounter (HOSPITAL_COMMUNITY): Payer: Self-pay

## 2020-03-26 DIAGNOSIS — N184 Chronic kidney disease, stage 4 (severe): Secondary | ICD-10-CM | POA: Diagnosis not present

## 2020-03-26 HISTORY — DX: Anemia, unspecified: D64.9

## 2020-03-26 LAB — PREPARE RBC (CROSSMATCH)

## 2020-03-26 LAB — HEMOGLOBIN AND HEMATOCRIT, BLOOD
HCT: 21.5 % — ABNORMAL LOW (ref 39.0–52.0)
Hemoglobin: 6.9 g/dL — CL (ref 13.0–17.0)

## 2020-03-26 LAB — POCT HEMOGLOBIN-HEMACUE: Hemoglobin: 6.8 g/dL — CL (ref 13.0–17.0)

## 2020-03-26 MED ORDER — EPOETIN ALFA 10000 UNIT/ML IJ SOLN
10000.0000 [IU] | Freq: Once | INTRAMUSCULAR | Status: AC
  Administered 2020-03-26: 10000 [IU] via SUBCUTANEOUS
  Filled 2020-03-26: qty 1

## 2020-03-27 ENCOUNTER — Encounter (HOSPITAL_COMMUNITY)
Admission: RE | Admit: 2020-03-27 | Discharge: 2020-03-27 | Disposition: A | Discharge status: Home / Self Care | Payer: No Typology Code available for payment source | Source: Ambulatory Visit | Attending: Nephrology | Admitting: Nephrology

## 2020-03-27 ENCOUNTER — Encounter (HOSPITAL_COMMUNITY): Payer: No Typology Code available for payment source

## 2020-03-27 ENCOUNTER — Other Ambulatory Visit: Payer: Self-pay

## 2020-03-27 ENCOUNTER — Other Ambulatory Visit (HOSPITAL_COMMUNITY): Payer: No Typology Code available for payment source

## 2020-03-27 ENCOUNTER — Encounter (HOSPITAL_COMMUNITY): Payer: Self-pay

## 2020-03-27 DIAGNOSIS — N184 Chronic kidney disease, stage 4 (severe): Secondary | ICD-10-CM | POA: Diagnosis not present

## 2020-03-27 MED ORDER — DIPHENHYDRAMINE HCL 25 MG PO CAPS
25.0000 mg | ORAL_CAPSULE | Freq: Once | ORAL | Status: AC
  Administered 2020-03-27: 25 mg via ORAL

## 2020-03-27 MED ORDER — ACETAMINOPHEN 325 MG PO TABS
650.0000 mg | ORAL_TABLET | Freq: Once | ORAL | Status: AC
  Administered 2020-03-27: 650 mg via ORAL

## 2020-03-27 MED ORDER — SODIUM CHLORIDE 0.9% IV SOLUTION: Freq: Once | INTRAVENOUS | Status: AC

## 2020-03-28 LAB — BPAM RBC
Blood Product Expiration Date: 202112062359
Blood Product Expiration Date: 202112062359
ISSUE DATE / TIME: 202111120909
ISSUE DATE / TIME: 202111121055
Unit Type and Rh: 6200
Unit Type and Rh: 6200

## 2020-03-28 LAB — TYPE AND SCREEN
ABO/RH(D): A POS
Antibody Screen: NEGATIVE
Unit division: 0
Unit division: 0

## 2020-04-03 ENCOUNTER — Encounter (HOSPITAL_COMMUNITY)
Admission: RE | Admit: 2020-04-03 | Discharge: 2020-04-03 | Disposition: A | Discharge status: Home / Self Care | Payer: No Typology Code available for payment source | Source: Ambulatory Visit | Attending: Nephrology | Admitting: Nephrology

## 2020-04-03 ENCOUNTER — Encounter (HOSPITAL_COMMUNITY): Payer: No Typology Code available for payment source

## 2020-04-03 ENCOUNTER — Encounter (HOSPITAL_COMMUNITY): Admission: RE | Admit: 2020-04-03 | Payer: No Typology Code available for payment source | Source: Ambulatory Visit

## 2020-04-03 ENCOUNTER — Other Ambulatory Visit: Payer: Self-pay

## 2020-04-03 ENCOUNTER — Encounter (HOSPITAL_COMMUNITY): Payer: Self-pay

## 2020-04-03 DIAGNOSIS — N184 Chronic kidney disease, stage 4 (severe): Secondary | ICD-10-CM | POA: Diagnosis not present

## 2020-04-03 LAB — POCT HEMOGLOBIN-HEMACUE: Hemoglobin: 8.9 g/dL — ABNORMAL LOW (ref 13.0–17.0)

## 2020-04-03 MED ORDER — EPOETIN ALFA 20000 UNIT/ML IJ SOLN
20000.0000 [IU] | Freq: Once | INTRAMUSCULAR | Status: AC
  Administered 2020-04-03: 20000 [IU] via SUBCUTANEOUS
  Filled 2020-04-03: qty 1

## 2020-04-13 ENCOUNTER — Encounter (HOSPITAL_COMMUNITY): Payer: Self-pay

## 2020-04-13 ENCOUNTER — Encounter (HOSPITAL_COMMUNITY)
Admission: RE | Admit: 2020-04-13 | Discharge: 2020-04-13 | Disposition: A | Discharge status: Home / Self Care | Payer: No Typology Code available for payment source | Source: Ambulatory Visit | Attending: Nephrology | Admitting: Nephrology

## 2020-04-13 ENCOUNTER — Other Ambulatory Visit: Payer: Self-pay

## 2020-04-13 DIAGNOSIS — N184 Chronic kidney disease, stage 4 (severe): Secondary | ICD-10-CM | POA: Diagnosis not present

## 2020-04-13 LAB — POCT HEMOGLOBIN-HEMACUE: Hemoglobin: 8.6 g/dL — ABNORMAL LOW (ref 13.0–17.0)

## 2020-04-13 MED ORDER — EPOETIN ALFA 20000 UNIT/ML IJ SOLN
INTRAMUSCULAR | Status: AC
  Filled 2020-04-13: qty 1

## 2020-04-13 MED ORDER — EPOETIN ALFA 20000 UNIT/ML IJ SOLN
20000.0000 [IU] | Freq: Once | INTRAMUSCULAR | Status: AC
  Administered 2020-04-13: 20000 [IU] via SUBCUTANEOUS

## 2020-04-20 ENCOUNTER — Encounter (HOSPITAL_COMMUNITY)
Admission: RE | Admit: 2020-04-20 | Discharge: 2020-04-20 | Disposition: A | Discharge status: Home / Self Care | Payer: No Typology Code available for payment source | Source: Ambulatory Visit | Attending: Nephrology | Admitting: Nephrology

## 2020-04-20 ENCOUNTER — Other Ambulatory Visit: Payer: Self-pay

## 2020-04-20 ENCOUNTER — Encounter (HOSPITAL_COMMUNITY): Payer: Self-pay

## 2020-04-20 DIAGNOSIS — D631 Anemia in chronic kidney disease: Secondary | ICD-10-CM | POA: Diagnosis present

## 2020-04-20 DIAGNOSIS — E1122 Type 2 diabetes mellitus with diabetic chronic kidney disease: Secondary | ICD-10-CM | POA: Diagnosis not present

## 2020-04-20 DIAGNOSIS — N041 Nephrotic syndrome with focal and segmental glomerular lesions: Secondary | ICD-10-CM | POA: Diagnosis not present

## 2020-04-20 DIAGNOSIS — E876 Hypokalemia: Secondary | ICD-10-CM | POA: Diagnosis not present

## 2020-04-20 DIAGNOSIS — N184 Chronic kidney disease, stage 4 (severe): Secondary | ICD-10-CM | POA: Insufficient documentation

## 2020-04-20 DIAGNOSIS — I5032 Chronic diastolic (congestive) heart failure: Secondary | ICD-10-CM | POA: Diagnosis not present

## 2020-04-20 LAB — POCT HEMOGLOBIN-HEMACUE: Hemoglobin: 8.8 g/dL — ABNORMAL LOW (ref 13.0–17.0)

## 2020-04-20 MED ORDER — EPOETIN ALFA 20000 UNIT/ML IJ SOLN
INTRAMUSCULAR | Status: AC
  Filled 2020-04-20: qty 1

## 2020-04-20 MED ORDER — EPOETIN ALFA 20000 UNIT/ML IJ SOLN
20000.0000 [IU] | Freq: Once | INTRAMUSCULAR | Status: AC
  Administered 2020-04-20: 20000 [IU] via SUBCUTANEOUS

## 2020-04-24 ENCOUNTER — Other Ambulatory Visit (HOSPITAL_COMMUNITY)
Admission: RE | Admit: 2020-04-24 | Discharge: 2020-04-24 | Disposition: A | Discharge status: Home / Self Care | Payer: No Typology Code available for payment source | Source: Ambulatory Visit | Attending: Nephrology | Admitting: Nephrology

## 2020-04-24 ENCOUNTER — Other Ambulatory Visit: Payer: Self-pay

## 2020-04-24 DIAGNOSIS — N041 Nephrotic syndrome with focal and segmental glomerular lesions: Secondary | ICD-10-CM | POA: Diagnosis present

## 2020-04-24 DIAGNOSIS — N189 Chronic kidney disease, unspecified: Secondary | ICD-10-CM | POA: Diagnosis present

## 2020-04-24 DIAGNOSIS — E876 Hypokalemia: Secondary | ICD-10-CM | POA: Diagnosis present

## 2020-04-24 DIAGNOSIS — I5032 Chronic diastolic (congestive) heart failure: Secondary | ICD-10-CM | POA: Diagnosis present

## 2020-04-24 DIAGNOSIS — E1122 Type 2 diabetes mellitus with diabetic chronic kidney disease: Secondary | ICD-10-CM | POA: Diagnosis present

## 2020-04-24 DIAGNOSIS — D631 Anemia in chronic kidney disease: Secondary | ICD-10-CM | POA: Insufficient documentation

## 2020-04-24 LAB — IRON AND TIBC
Iron: 33 ug/dL — ABNORMAL LOW (ref 45–182)
Saturation Ratios: 17 % — ABNORMAL LOW (ref 17.9–39.5)
TIBC: 196 ug/dL — ABNORMAL LOW (ref 250–450)
UIBC: 163 ug/dL

## 2020-04-24 LAB — CBC
HCT: 28.4 % — ABNORMAL LOW (ref 39.0–52.0)
Hemoglobin: 8.8 g/dL — ABNORMAL LOW (ref 13.0–17.0)
MCH: 29.8 pg (ref 26.0–34.0)
MCHC: 31 g/dL (ref 30.0–36.0)
MCV: 96.3 fL (ref 80.0–100.0)
Platelets: 154 10*3/uL (ref 150–400)
RBC: 2.95 MIL/uL — ABNORMAL LOW (ref 4.22–5.81)
RDW: 15.1 % (ref 11.5–15.5)
WBC: 4.8 10*3/uL (ref 4.0–10.5)
nRBC: 0 % (ref 0.0–0.2)

## 2020-04-24 LAB — RENAL FUNCTION PANEL
Albumin: 3.2 g/dL — ABNORMAL LOW (ref 3.5–5.0)
Anion gap: 10 (ref 5–15)
BUN: 62 mg/dL — ABNORMAL HIGH (ref 8–23)
CO2: 25 mmol/L (ref 22–32)
Calcium: 8.4 mg/dL — ABNORMAL LOW (ref 8.9–10.3)
Chloride: 103 mmol/L (ref 98–111)
Creatinine, Ser: 3.43 mg/dL — ABNORMAL HIGH (ref 0.61–1.24)
GFR, Estimated: 18 mL/min — ABNORMAL LOW (ref >60)
Glucose, Bld: 141 mg/dL — ABNORMAL HIGH (ref 70–99)
Phosphorus: 2.9 mg/dL (ref 2.5–4.6)
Potassium: 2.9 mmol/L — ABNORMAL LOW (ref 3.5–5.1)
Sodium: 138 mmol/L (ref 135–145)

## 2020-04-24 LAB — FERRITIN: Ferritin: 322 ng/mL (ref 24–336)

## 2020-04-24 LAB — VITAMIN D 25 HYDROXY (VIT D DEFICIENCY, FRACTURES): Vit D, 25-Hydroxy: 36.67 ng/mL (ref 30–100)

## 2020-04-24 LAB — PROTEIN / CREATININE RATIO, URINE
Creatinine, Urine: 85.16 mg/dL
Protein Creatinine Ratio: 5.81 mg/mg{Cre} — ABNORMAL HIGH (ref 0.00–0.15)
Total Protein, Urine: 495 mg/dL

## 2020-04-25 LAB — PTH, INTACT AND CALCIUM
Calcium, Total (PTH): 8.5 mg/dL — ABNORMAL LOW (ref 8.6–10.2)
PTH: 46 pg/mL (ref 15–65)

## 2020-04-27 ENCOUNTER — Encounter (HOSPITAL_COMMUNITY): Payer: Self-pay

## 2020-04-27 ENCOUNTER — Encounter (HOSPITAL_COMMUNITY)
Admission: RE | Admit: 2020-04-27 | Discharge: 2020-04-27 | Disposition: A | Discharge status: Home / Self Care | Payer: No Typology Code available for payment source | Source: Ambulatory Visit | Attending: Nephrology | Admitting: Nephrology

## 2020-04-27 ENCOUNTER — Other Ambulatory Visit: Payer: Self-pay

## 2020-04-27 DIAGNOSIS — N184 Chronic kidney disease, stage 4 (severe): Secondary | ICD-10-CM | POA: Diagnosis not present

## 2020-04-27 LAB — POTASSIUM: Potassium: 3.5 mmol/L (ref 3.5–5.1)

## 2020-04-27 LAB — POCT HEMOGLOBIN-HEMACUE: Hemoglobin: 9.2 g/dL — ABNORMAL LOW (ref 13.0–17.0)

## 2020-04-27 MED ORDER — EPOETIN ALFA 20000 UNIT/ML IJ SOLN
20000.0000 [IU] | Freq: Once | INTRAMUSCULAR | Status: AC
  Administered 2020-04-27: 15:00:00 20000 [IU] via SUBCUTANEOUS
  Filled 2020-04-27: qty 1

## 2020-05-04 ENCOUNTER — Encounter (HOSPITAL_COMMUNITY): Payer: Self-pay

## 2020-05-04 ENCOUNTER — Other Ambulatory Visit: Payer: Self-pay

## 2020-05-04 ENCOUNTER — Encounter (HOSPITAL_COMMUNITY)
Admission: RE | Admit: 2020-05-04 | Discharge: 2020-05-04 | Disposition: A | Discharge status: Home / Self Care | Payer: No Typology Code available for payment source | Source: Ambulatory Visit | Attending: Nephrology | Admitting: Nephrology

## 2020-05-04 DIAGNOSIS — N184 Chronic kidney disease, stage 4 (severe): Secondary | ICD-10-CM | POA: Diagnosis not present

## 2020-05-04 LAB — POCT HEMOGLOBIN-HEMACUE: Hemoglobin: 9.1 g/dL — ABNORMAL LOW (ref 13.0–17.0)

## 2020-05-04 MED ORDER — EPOETIN ALFA 20000 UNIT/ML IJ SOLN
20000.0000 [IU] | Freq: Once | INTRAMUSCULAR | Status: AC
  Administered 2020-05-04: 14:00:00 20000 [IU] via SUBCUTANEOUS

## 2020-05-04 MED ORDER — EPOETIN ALFA 20000 UNIT/ML IJ SOLN
INTRAMUSCULAR | Status: AC
  Filled 2020-05-04: qty 1

## 2020-05-08 ENCOUNTER — Other Ambulatory Visit: Payer: Self-pay

## 2020-05-08 ENCOUNTER — Emergency Department (HOSPITAL_COMMUNITY): Payer: No Typology Code available for payment source

## 2020-05-08 ENCOUNTER — Encounter (HOSPITAL_COMMUNITY): Payer: Self-pay | Admitting: Emergency Medicine

## 2020-05-08 ENCOUNTER — Inpatient Hospital Stay (HOSPITAL_COMMUNITY)
Admission: EM | Admit: 2020-05-08 | Discharge: 2020-05-10 | DRG: 291 | Disposition: A | Discharge status: Home / Self Care | Payer: No Typology Code available for payment source | Attending: Internal Medicine | Admitting: Internal Medicine

## 2020-05-08 DIAGNOSIS — D631 Anemia in chronic kidney disease: Secondary | ICD-10-CM | POA: Diagnosis not present

## 2020-05-08 DIAGNOSIS — Z888 Allergy status to other drugs, medicaments and biological substances status: Secondary | ICD-10-CM | POA: Diagnosis not present

## 2020-05-08 DIAGNOSIS — K8020 Calculus of gallbladder without cholecystitis without obstruction: Secondary | ICD-10-CM | POA: Diagnosis present

## 2020-05-08 DIAGNOSIS — Z87891 Personal history of nicotine dependence: Secondary | ICD-10-CM

## 2020-05-08 DIAGNOSIS — I10 Essential (primary) hypertension: Secondary | ICD-10-CM | POA: Diagnosis present

## 2020-05-08 DIAGNOSIS — Z7902 Long term (current) use of antithrombotics/antiplatelets: Secondary | ICD-10-CM

## 2020-05-08 DIAGNOSIS — E039 Hypothyroidism, unspecified: Secondary | ICD-10-CM | POA: Diagnosis not present

## 2020-05-08 DIAGNOSIS — K760 Fatty (change of) liver, not elsewhere classified: Secondary | ICD-10-CM | POA: Diagnosis not present

## 2020-05-08 DIAGNOSIS — Z8249 Family history of ischemic heart disease and other diseases of the circulatory system: Secondary | ICD-10-CM

## 2020-05-08 DIAGNOSIS — Z8673 Personal history of transient ischemic attack (TIA), and cerebral infarction without residual deficits: Secondary | ICD-10-CM | POA: Diagnosis not present

## 2020-05-08 DIAGNOSIS — J449 Chronic obstructive pulmonary disease, unspecified: Secondary | ICD-10-CM | POA: Diagnosis not present

## 2020-05-08 DIAGNOSIS — I2510 Atherosclerotic heart disease of native coronary artery without angina pectoris: Secondary | ICD-10-CM | POA: Diagnosis present

## 2020-05-08 DIAGNOSIS — Z79890 Hormone replacement therapy (postmenopausal): Secondary | ICD-10-CM | POA: Diagnosis not present

## 2020-05-08 DIAGNOSIS — I132 Hypertensive heart and chronic kidney disease with heart failure and with stage 5 chronic kidney disease, or end stage renal disease: Secondary | ICD-10-CM | POA: Diagnosis not present

## 2020-05-08 DIAGNOSIS — I25810 Atherosclerosis of coronary artery bypass graft(s) without angina pectoris: Secondary | ICD-10-CM | POA: Diagnosis present

## 2020-05-08 DIAGNOSIS — R0602 Shortness of breath: Secondary | ICD-10-CM | POA: Diagnosis present

## 2020-05-08 DIAGNOSIS — I5033 Acute on chronic diastolic (congestive) heart failure: Secondary | ICD-10-CM | POA: Diagnosis not present

## 2020-05-08 DIAGNOSIS — Z79899 Other long term (current) drug therapy: Secondary | ICD-10-CM | POA: Diagnosis not present

## 2020-05-08 DIAGNOSIS — I509 Heart failure, unspecified: Secondary | ICD-10-CM

## 2020-05-08 DIAGNOSIS — I252 Old myocardial infarction: Secondary | ICD-10-CM

## 2020-05-08 DIAGNOSIS — I5031 Acute diastolic (congestive) heart failure: Secondary | ICD-10-CM | POA: Diagnosis present

## 2020-05-08 DIAGNOSIS — N185 Chronic kidney disease, stage 5: Secondary | ICD-10-CM | POA: Diagnosis not present

## 2020-05-08 DIAGNOSIS — R001 Bradycardia, unspecified: Secondary | ICD-10-CM | POA: Diagnosis not present

## 2020-05-08 DIAGNOSIS — E785 Hyperlipidemia, unspecified: Secondary | ICD-10-CM | POA: Diagnosis not present

## 2020-05-08 DIAGNOSIS — Z20822 Contact with and (suspected) exposure to covid-19: Secondary | ICD-10-CM | POA: Diagnosis present

## 2020-05-08 DIAGNOSIS — F419 Anxiety disorder, unspecified: Secondary | ICD-10-CM | POA: Diagnosis not present

## 2020-05-08 DIAGNOSIS — K219 Gastro-esophageal reflux disease without esophagitis: Secondary | ICD-10-CM | POA: Diagnosis not present

## 2020-05-08 DIAGNOSIS — N400 Enlarged prostate without lower urinary tract symptoms: Secondary | ICD-10-CM | POA: Diagnosis not present

## 2020-05-08 DIAGNOSIS — Z951 Presence of aortocoronary bypass graft: Secondary | ICD-10-CM

## 2020-05-08 DIAGNOSIS — I5030 Unspecified diastolic (congestive) heart failure: Secondary | ICD-10-CM | POA: Diagnosis present

## 2020-05-08 DIAGNOSIS — J9601 Acute respiratory failure with hypoxia: Secondary | ICD-10-CM | POA: Diagnosis present

## 2020-05-08 DIAGNOSIS — R0902 Hypoxemia: Secondary | ICD-10-CM

## 2020-05-08 DIAGNOSIS — R008 Other abnormalities of heart beat: Secondary | ICD-10-CM

## 2020-05-08 LAB — CBC
HCT: 29.9 % — ABNORMAL LOW (ref 39.0–52.0)
Hemoglobin: 9.1 g/dL — ABNORMAL LOW (ref 13.0–17.0)
MCH: 29.4 pg (ref 26.0–34.0)
MCHC: 30.4 g/dL (ref 30.0–36.0)
MCV: 96.5 fL (ref 80.0–100.0)
Platelets: 183 10*3/uL (ref 150–400)
RBC: 3.1 MIL/uL — ABNORMAL LOW (ref 4.22–5.81)
RDW: 15.4 % (ref 11.5–15.5)
WBC: 5.6 10*3/uL (ref 4.0–10.5)
nRBC: 0 % (ref 0.0–0.2)

## 2020-05-08 LAB — BASIC METABOLIC PANEL
Anion gap: 12 (ref 5–15)
BUN: 67 mg/dL — ABNORMAL HIGH (ref 8–23)
CO2: 26 mmol/L (ref 22–32)
Calcium: 8.8 mg/dL — ABNORMAL LOW (ref 8.9–10.3)
Chloride: 100 mmol/L (ref 98–111)
Creatinine, Ser: 4.33 mg/dL — ABNORMAL HIGH (ref 0.61–1.24)
GFR, Estimated: 14 mL/min — ABNORMAL LOW (ref >60)
Glucose, Bld: 116 mg/dL — ABNORMAL HIGH (ref 70–99)
Potassium: 3.8 mmol/L (ref 3.5–5.1)
Sodium: 138 mmol/L (ref 135–145)

## 2020-05-08 LAB — RESP PANEL BY RT-PCR (FLU A&B, COVID) ARPGX2
Influenza A by PCR: NEGATIVE
Influenza B by PCR: NEGATIVE
SARS Coronavirus 2 by RT PCR: NEGATIVE

## 2020-05-08 LAB — TROPONIN I (HIGH SENSITIVITY)
Troponin I (High Sensitivity): 27 ng/L — ABNORMAL HIGH (ref <18)
Troponin I (High Sensitivity): 27 ng/L — ABNORMAL HIGH (ref <18)

## 2020-05-08 LAB — BRAIN NATRIURETIC PEPTIDE: B Natriuretic Peptide: 1757 pg/mL — ABNORMAL HIGH (ref 0.0–100.0)

## 2020-05-08 LAB — MAGNESIUM: Magnesium: 1.9 mg/dL (ref 1.7–2.4)

## 2020-05-08 MED ORDER — ACETAMINOPHEN 325 MG PO TABS: 650.0000 mg | ORAL_TABLET | Freq: Four times a day (QID) | ORAL | Status: DC | PRN

## 2020-05-08 MED ORDER — POLYETHYLENE GLYCOL 3350 17 G PO PACK: 17.0000 g | PACK | Freq: Every day | ORAL | Status: DC | PRN

## 2020-05-08 MED ORDER — POTASSIUM CHLORIDE CRYS ER 20 MEQ PO TBCR
20.0000 meq | EXTENDED_RELEASE_TABLET | Freq: Once | ORAL | Status: AC
  Administered 2020-05-08: 14:00:00 20 meq via ORAL
  Filled 2020-05-08: qty 1

## 2020-05-08 MED ORDER — FOLIC ACID 1 MG PO TABS: 1.0000 mg | ORAL_TABLET | Freq: Every day | ORAL | Status: DC

## 2020-05-08 MED ORDER — FUROSEMIDE 10 MG/ML IJ SOLN
80.0000 mg | Freq: Once | INTRAMUSCULAR | Status: AC
  Administered 2020-05-08: 14:00:00 80 mg via INTRAVENOUS
  Filled 2020-05-08: qty 8

## 2020-05-08 MED ORDER — HYDRALAZINE HCL 50 MG PO TABS
100.0000 mg | ORAL_TABLET | Freq: Three times a day (TID) | ORAL | Status: DC
  Administered 2020-05-08 – 2020-05-10 (×5): 100 mg via ORAL
  Filled 2020-05-08 (×7): qty 2

## 2020-05-08 MED ORDER — VITAMIN D 25 MCG (1000 UNIT) PO TABS
1000.0000 [IU] | ORAL_TABLET | Freq: Every day | ORAL | Status: DC
  Administered 2020-05-09 – 2020-05-10 (×2): 1000 [IU] via ORAL
  Filled 2020-05-08 (×2): qty 1

## 2020-05-08 MED ORDER — SODIUM CHLORIDE 0.9% FLUSH
3.0000 mL | Freq: Two times a day (BID) | INTRAVENOUS | Status: DC
  Administered 2020-05-09 – 2020-05-10 (×3): 3 mL via INTRAVENOUS

## 2020-05-08 MED ORDER — NITROGLYCERIN 0.4 MG SL SUBL: 0.4000 mg | SUBLINGUAL_TABLET | SUBLINGUAL | Status: DC | PRN

## 2020-05-08 MED ORDER — ISOSORBIDE MONONITRATE ER 60 MG PO TB24
120.0000 mg | ORAL_TABLET | Freq: Every day | ORAL | Status: DC
  Administered 2020-05-09 – 2020-05-10 (×2): 120 mg via ORAL
  Filled 2020-05-08 (×2): qty 2

## 2020-05-08 MED ORDER — SODIUM CHLORIDE 0.9 % IV SOLN: 250.0000 mL | INTRAVENOUS | Status: DC | PRN

## 2020-05-08 MED ORDER — OMEGA-3-ACID ETHYL ESTERS 1 G PO CAPS
2.0000 g | ORAL_CAPSULE | Freq: Two times a day (BID) | ORAL | Status: DC
  Administered 2020-05-08 – 2020-05-10 (×4): 2 g via ORAL
  Filled 2020-05-08 (×4): qty 2

## 2020-05-08 MED ORDER — FINASTERIDE 5 MG PO TABS
5.0000 mg | ORAL_TABLET | Freq: Every day | ORAL | Status: DC
  Administered 2020-05-09 – 2020-05-10 (×2): 5 mg via ORAL
  Filled 2020-05-08 (×2): qty 1

## 2020-05-08 MED ORDER — PANTOPRAZOLE SODIUM 40 MG PO TBEC
40.0000 mg | DELAYED_RELEASE_TABLET | Freq: Every day | ORAL | Status: DC
  Administered 2020-05-09 – 2020-05-10 (×2): 40 mg via ORAL
  Filled 2020-05-08 (×2): qty 1

## 2020-05-08 MED ORDER — AMLODIPINE BESYLATE 5 MG PO TABS
10.0000 mg | ORAL_TABLET | Freq: Every day | ORAL | Status: DC
  Administered 2020-05-09 – 2020-05-10 (×2): 10 mg via ORAL
  Filled 2020-05-08 (×2): qty 2

## 2020-05-08 MED ORDER — ACETAMINOPHEN 650 MG RE SUPP: 650.0000 mg | Freq: Four times a day (QID) | RECTAL | Status: DC | PRN

## 2020-05-08 MED ORDER — ATORVASTATIN CALCIUM 80 MG PO TABS
80.0000 mg | ORAL_TABLET | Freq: Every day | ORAL | Status: DC
  Administered 2020-05-09 – 2020-05-10 (×2): 80 mg via ORAL
  Filled 2020-05-08 (×2): qty 1

## 2020-05-08 MED ORDER — ONDANSETRON HCL 4 MG/2ML IJ SOLN: 4.0000 mg | Freq: Four times a day (QID) | INTRAMUSCULAR | Status: DC | PRN

## 2020-05-08 MED ORDER — SODIUM BICARBONATE 650 MG PO TABS
650.0000 mg | ORAL_TABLET | Freq: Two times a day (BID) | ORAL | Status: DC
Start: 2020-05-08 — End: 2020-05-08

## 2020-05-08 MED ORDER — ONDANSETRON HCL 4 MG PO TABS: 4.0000 mg | ORAL_TABLET | Freq: Four times a day (QID) | ORAL | Status: DC | PRN

## 2020-05-08 MED ORDER — ALLOPURINOL 100 MG PO TABS
100.0000 mg | ORAL_TABLET | Freq: Every day | ORAL | Status: DC
  Administered 2020-05-09 – 2020-05-10 (×2): 100 mg via ORAL
  Filled 2020-05-08 (×2): qty 1

## 2020-05-08 MED ORDER — TRAZODONE HCL 50 MG PO TABS: 50.0000 mg | ORAL_TABLET | Freq: Every evening | ORAL | Status: DC | PRN

## 2020-05-08 MED ORDER — HEPARIN SODIUM (PORCINE) 5000 UNIT/ML IJ SOLN
5000.0000 [IU] | Freq: Three times a day (TID) | INTRAMUSCULAR | Status: DC
  Administered 2020-05-08 – 2020-05-10 (×5): 5000 [IU] via SUBCUTANEOUS
  Filled 2020-05-08 (×5): qty 1

## 2020-05-08 MED ORDER — FUROSEMIDE 10 MG/ML IJ SOLN
80.0000 mg | Freq: Two times a day (BID) | INTRAMUSCULAR | Status: DC
  Administered 2020-05-08 – 2020-05-09 (×3): 80 mg via INTRAVENOUS
  Filled 2020-05-08 (×3): qty 8

## 2020-05-08 MED ORDER — CLOPIDOGREL BISULFATE 75 MG PO TABS
75.0000 mg | ORAL_TABLET | Freq: Every day | ORAL | Status: DC
Start: 2020-05-08 — End: 2020-05-10
  Administered 2020-05-09 – 2020-05-10 (×2): 75 mg via ORAL
  Filled 2020-05-08 (×2): qty 1

## 2020-05-08 MED ORDER — ADULT MULTIVITAMIN W/MINERALS CH
1.0000 | ORAL_TABLET | Freq: Every day | ORAL | Status: DC
  Administered 2020-05-09 – 2020-05-10 (×2): 1 via ORAL
  Filled 2020-05-08 (×2): qty 1

## 2020-05-08 MED ORDER — RA FISH OIL 1000 MG PO CAPS: 2000.0000 mg | ORAL_CAPSULE | Freq: Two times a day (BID) | ORAL | Status: DC

## 2020-05-08 MED ORDER — TAMSULOSIN HCL 0.4 MG PO CAPS
0.4000 mg | ORAL_CAPSULE | Freq: Every day | ORAL | Status: DC
  Administered 2020-05-09 – 2020-05-10 (×2): 0.4 mg via ORAL
  Filled 2020-05-08 (×2): qty 1

## 2020-05-08 MED ORDER — UMECLIDINIUM BROMIDE 62.5 MCG/INH IN AEPB
1.0000 | INHALATION_SPRAY | Freq: Every day | RESPIRATORY_TRACT | Status: DC
  Administered 2020-05-09 – 2020-05-10 (×2): 1 via RESPIRATORY_TRACT
  Filled 2020-05-08: qty 7

## 2020-05-08 MED ORDER — OXYCODONE-ACETAMINOPHEN 5-325 MG PO TABS: 1.0000 | ORAL_TABLET | Freq: Four times a day (QID) | ORAL | Status: DC | PRN

## 2020-05-08 MED ORDER — BISACODYL 10 MG RE SUPP: 10.0000 mg | Freq: Every day | RECTAL | Status: DC | PRN

## 2020-05-08 MED ORDER — SODIUM CHLORIDE 0.9% FLUSH: 3.0000 mL | INTRAVENOUS | Status: DC | PRN

## 2020-05-08 MED ORDER — FOLIC ACID 1 MG PO TABS
1.0000 mg | ORAL_TABLET | Freq: Every day | ORAL | Status: DC
  Administered 2020-05-09 – 2020-05-10 (×2): 1 mg via ORAL
  Filled 2020-05-08 (×2): qty 1

## 2020-05-08 MED ORDER — LEVOTHYROXINE SODIUM 50 MCG PO TABS
50.0000 ug | ORAL_TABLET | Freq: Every day | ORAL | Status: DC
  Administered 2020-05-09 – 2020-05-10 (×2): 50 ug via ORAL
  Filled 2020-05-08 (×2): qty 1

## 2020-05-08 NOTE — ED Notes (Signed)
Pt denies pain, report to care link, pt states that he is ready for transport.

## 2020-05-08 NOTE — H&P (Signed)
Patient Demographics:    [name], is a 74 y.o. male  MRN: [id number]   DOB - [birth year]  Admit Date - 05/08/2020  Outpatient Primary MD for the patient is [facility], [place]:    Principal Problem:   Acute exacerbation of Diastolic CHF (congestive heart failure) ([place]) Active Problems:   (HFpEF) heart failure with preserved ejection fraction (HCC)   Acute respiratory failure with hypoxia (HCC)   Symptomatic bradycardia with heart failure   COPD without exacerbation (HCC)---40 Pack Years   Essential hypertension   S/P CABG x 2   CKD (chronic kidney disease), stage V (HCC)   Anemia in chronic kidney disease   Coronary artery disease involving autologous artery coronary bypass graft    1)HFpEF--patient with chronic diastolic dysfunction CHF--presenting with acute on chronic diastolic CHF exacerbation with hypoxia -In ED HR down to the 30s, persistent Bradycardia noted--- -Patient increased oral Lasix at home without significant improvement -Failed outpatient oral diuretic therapy, needs IV Lasix especially given poor renal function -Echo from January 2021 revealed echo with EF down to 60 to 65% fromprior EF of 65 to 40% with diastolic dysfunction -IV Lasix as ordered, daily fluid and weight monitoring -Monitor electrolytes closely with CKD stage V ???  Bradycardia is contributing to patient's CHF exacerbation --We will discontinue Coreg  due to concerns for bradycardia -Repeat echo requested -Troponins 27 -BNP is 1,757 from prior level of around 660, chest x-ray consistent with CHF  2)Aki on CKD V --- Versus CKD progression   -Suspect worsening renal function is due to decreased renal perfusion in the setting of CHF exacerbation compounded by Lasix use -Patient's primary  nephrologist is Dr. Theador Hawthorne in [place] -Creatinine was 3.43 on 04/24/20 -Creatinine today on admission is 4.3 -Pt is s/p right brachiocephalic arteriovenous fistula by Dr. Scot Dock 02/11/20 in anticipation of hemodialysis initiation in the near future -PTH is 46, vitamin D is up to 36.6 ,  - renally adjust medications, avoid nephrotoxic agents / dehydration  / hypotension --Patient continues to have fair urine output at this time -Monitor electrolytes, fluid input/output and daily weight -Consider in-house nephrology consult if renal function worsens with diuresis  3) chronic anemia of CKD--- -Hemoglobin is 9.1 which is close to patient's recent baseline,  Procrit/ESA as per nephrologist -No bleeding concerns  4)CAD-prior CABG in 2008,LHC in 2015 with angioplasty and stenting--- -Troponin is 27 in the setting of worsening renal function--- patient is chest pain-free currently  -Continue Lipitor, Plavix  and isosorbide --We will discontinue Coreg  due to concerns for bradycardia  5)Hypothyroidism--- stable, continue Levothyroxine 50 mcg daily  6)HTN--okay to restart amlodipine 10 mg daily, along with hydralazine 100 mg 3 times daily, isosorbide 120 mg daily  and Lasix  -We will discontinue Coreg  due to concerns for bradycardia  7)BPH--stable, continue Flomax  8) cholelithiasis without cholecystitis--- abdominal ultrasound and CT abdomen and pelvis noted -Asymptomatic at this  time  9)10.6 CM-left renal cyst, nephrology consult pending, may need outpatient imaging surveillance/follow-up  10) fatty liver---- continue Lipitor for CAD as above #4  11)COPD--reformed smoker,  no acute exacerbation at this time -Bronchodilators ordered  12) acute hypoxic respiratory failure--O2 sats dropped to 81 with minimum ambulation on room air, requiring 2 L of oxygen via nasal cannula, -Suspect hypoxia secondary to CHF exacerbation as above #1  13)In ED HR down to the 30s, persistent  Bradycardia noted--- patient with worsening heart failure with hypoxia and significant dyspnea on exertion in the setting of symptomatic persistent bradycardia  patient to be admitted to [facility] as there is no cardiology coverage available at [facility] until Monday, 05/11/2020  Disposition/Need for in-Hospital Stay- patient unable to be discharged at this time due to --Failed outpatient oral diuretic therapy, needs IV Lasix especially given poor renal function, requires IV diuresis with close monitoring of renal function and electrolytes  Dispo: The patient is from: Home              Anticipated d/c is to: Home              Anticipated d/c date is: 2 days              Patient currently is not medically stable to d/c. Barriers: Not Clinically Stable-    With History of - Reviewed by me  Past Medical History:  Diagnosis Date  . Acute myocardial infarction of other inferior wall, initial episode of care   . Anemia   . Anginal pain ([place])   . Anxiety   . Arthritis   . Cancer [facility])    bladder 2013 removed, has cystoscopy 10/2016, has returned x 2  . COPD (chronic obstructive pulmonary disease) ([place])   . Coronary artery disease    Artery bypass graft Jan 2008  . Dyspnea   . Dysrhythmia   . GERD (gastroesophageal reflux disease)   . Heart murmur   . History of hiatal hernia   . Hyperlipidemia   . Hypertension   . Hypothyroidism   . PONV (postoperative nausea and vomiting)   . Sleep apnea   . Stroke ([place])   . Tobacco user       Past Surgical History:  Procedure Laterality Date  . APPENDECTOMY    . AV FISTULA PLACEMENT Right 02/11/2020   Procedure: RIGHT ARM BRACHIOCEPHALIC ARTERIOVENOUS (AV) FISTULA;  Surgeon: Angelia Mould, MD;  Location: [place];  Service: Vascular;  Laterality: Right;  . CARDIAC CATHETERIZATION    . CORONARY ARTERY BYPASS GRAFT  06/14/2006   x 5  . CORONARY ARTERY BYPASS GRAFT N/A 09/19/2016   Procedure: REDO CORONARY ARTERY BYPASS  GRAFTING (CABG) x two, using right internal mammary artery and left radial artery;  Surgeon: [name], MD;  Location: [place];  Service: Open Heart Surgery;  Laterality: N/A;  . FOOT SURGERY    . HERNIA REPAIR    . LEFT HEART CATH AND CORS/GRAFTS ANGIOGRAPHY N/A 09/14/2016   Procedure: Left Heart Cath and Cors/Grafts Angiography;  Surgeon: Troy Sine, MD;  Location: [place] CV LAB;  Service: Cardiovascular;  Laterality: N/A;  . LEFT HEART CATHETERIZATION WITH CORONARY/GRAFT ANGIOGRAM N/A 01/31/2014   Procedure: LEFT HEART CATHETERIZATION WITH Beatrix Fetters;  Surgeon: Sanda Klein, MD;  Location: [place] CATH LAB;  Service: Cardiovascular;  Laterality: N/A;  . NOSE SURGERY    . PERCUTANEOUS CORONARY STENT INTERVENTION (PCI-S)  01/31/2014   Procedure: PERCUTANEOUS CORONARY  STENT INTERVENTION (PCI-S);  Surgeon: Sanda Klein, MD;  Location: [facility] CATH LAB;  Service: Cardiovascular;;  . RADIAL ARTERY HARVEST Left 09/19/2016   Procedure: RADIAL ARTERY HARVEST;  Surgeon: [name], MD;  Location: [place];  Service: Open Heart Surgery;  Laterality: Left;  . TEE WITHOUT CARDIOVERSION N/A 09/19/2016   Procedure: TRANSESOPHAGEAL ECHOCARDIOGRAM (TEE);  Surgeon: [name], MD;  Location: [place];  Service: Open Heart Surgery;  Laterality: N/A;    Chief Complaint  Patient presents with  . Shortness of Breath      HPI:    [name]  is a 74 y.o. male 74 y.o. male with past medical history relevant for CAD (s/p prior CABG), COPD, HTN, BPH and CKD V as well as chronic anemia of CKD  who presents to the ED with worsening dyspnea at rest and dyspnea on exertion for at least 3 days -Denies frank chest pains, denies orthopnea or paroxysmal nocturnal dyspnea -Denies fever chills or adductive cough -Completed Moderna Vaccination with Booster  (06/25/19, 07/23/19 and 01/17/20) --- Patient primary complaint is worsening dyspnea on exertion, with increasing lower extremity edema especially on  the right, denies chest pains or pleuritic symptoms-- he increased his lasix from 40 mg bid to 60 mg in the morning and 40 in the evening----however, dyspnea on exertion persisted --O2 sats dropped to 81 with minimum ambulation on room air, requiring 2 L of oxygen via nasal cannula, - In ED HR down to the 30s, persistent Bradycardia noted--- --We will discontinue Coreg  due to concerns for bradycardia EDP called and discussed case with on-call cardiologist Dr. Oval Linsey who stated no specific cardiology intervention required at this time  -   Review of systems:    In addition to the HPI above,   A full Review of  Systems was done, all other systems reviewed are negative except as noted above in HPI , .    Social History:  Reviewed by me    Social History   Tobacco Use  . Smoking status: Former Smoker    Packs/day: 1.00    Years: 40.00    Pack years: 40.00    Types: Cigarettes    Start date: 02/20/1959    Quit date: 09/13/2016    Years since quitting: 3.6  . Smokeless tobacco: Never Used  . Tobacco comment: 10/7 2-8 cigarettes per day. cutting back- AJ  Substance Use Topics  . Alcohol use: No    Alcohol/week: 0.0 standard drinks    Comment: "No, not really"      Family History :  Reviewed by me    Family History  Problem Relation Age of Onset  . Heart attack Mother   . Heart attack Brother      Home Medications:   Prior to Admission medications   Medication Sig Start Date End Date Taking? Authorizing Provider  acetaminophen (TYLENOL) 325 MG tablet Take 2 tablets (650 mg total) by mouth every 6 (six) hours as needed for mild pain. 09/23/16   Nani Skillern, PA-C  allopurinol (ZYLOPRIM) 100 MG tablet Take 100 mg by mouth daily.  05/03/19   [provider]  amLODipine (NORVASC) 10 MG tablet Take 1 tablet (10 mg total) by mouth daily. 05/18/19   Roxan Hockey, MD  atorvastatin (LIPITOR) 80 MG tablet Take 80 mg by mouth daily.    [provider]   carvedilol (COREG) 6.25 MG tablet Take 1 tablet (6.25 mg total) by mouth 2 (two) times daily. 05/18/19  05/17/20  Roxan Hockey, MD  clopidogrel (PLAVIX) 75 MG tablet Take 75 mg by mouth daily.    [provider]  epoetin alfa (EPOGEN) 20000 UNIT/ML injection Inject 20,000 Units into the skin every 14 (fourteen) days.    [provider]  famotidine (PEPCID) 40 MG tablet Take by mouth.    [provider]  finasteride (PROSCAR) 5 MG tablet Take 5 mg by mouth daily.    [provider]  folic acid (FOLVITE) 1 MG tablet Take by mouth.    [provider]  furosemide (LASIX) 40 MG tablet Take 0.5 tablets (20 mg total) by mouth 2 (two) times daily. 02/11/20   Geradine Girt, DO  hydrALAZINE (APRESOLINE) 100 MG tablet Take 1 tablet (100 mg total) by mouth 3 (three) times daily. 05/18/19   Roxan Hockey, MD  isosorbide mononitrate (IMDUR) 120 MG 24 hr tablet Take 1 tablet (120 mg total) by mouth daily. 05/18/19   Roxan Hockey, MD  levothyroxine (SYNTHROID, LEVOTHROID) 50 MCG tablet Take 50 mcg by mouth daily before breakfast.    [provider]  nitroGLYCERIN (NITROSTAT) 0.4 MG SL tablet Place 0.4 mg under the tongue every 5 (five) minutes as needed for chest pain.    [provider]  Omega-3 Fatty Acids (RA FISH OIL) 1000 MG CAPS Take 2,000 mg by mouth 2 (two) times daily.     [provider]  omeprazole (PRILOSEC) 20 MG capsule Take 20 mg by mouth 2 (two) times daily. 02/14/20   [provider]  oxyCODONE-acetaminophen (PERCOCET) 5-325 MG tablet Take 1 tablet by mouth every 6 (six) hours as needed for severe pain. 02/11/20   Rhyne, Hulen Shouts, PA-C  potassium chloride (KLOR-CON) 8 MEQ tablet Take 8 mEq by mouth daily. 10/23/19   [provider]  sodium bicarbonate 650 MG tablet Take 1 tablet (650 mg total) by mouth 2 (two) times daily. 02/11/20   Geradine Girt, DO  tamsulosin (FLOMAX) 0.4 MG CAPS capsule Take 0.4 mg by  mouth daily.    [provider]  Vitamin D, Cholecalciferol, 25 MCG (1000 UT) CAPS Take 1 capsule by mouth daily.     [provider]     Allergies:     Allergies  Allergen Reactions  . Cardizem Cd [Diltiazem Hcl Er Beads] Palpitations    "Makes heart skip"     Physical Exam:   Vitals  Blood pressure (!) 137/47, pulse (!) 30, temperature 97.7 F (36.5 C), temperature source Oral, resp. rate (!) 22, SpO2 93 %.   Temp:  [97.7 F (36.5 C)] 97.7 F (36.5 C) (12/24 1148) Pulse Rate:  [30-69] 30 (12/24 1430) Resp:  [18-25] 22 (12/24 1430) BP: (136-154)/(42-82) 137/47 (12/24 1430) SpO2:  [91 %-97 %] 93 % (12/24 1430) -Oxygen/dropped to 81% recovered on 2 L of nasal cannula  Physical Examination: General appearance - alert,  and in no distress, patient becomes very dyspneic and hypoxic with minimal activity Nose- [place] 2L/min Mental status - alert, oriented to person, place, and time,  Eyes - sclera anicteric Neck - supple,  Chest -diminished in bases with faint bibasilar rales  heart - S1 and S2 normal, regular, CABG scar  Abdomen - soft, nontender, nondistended, no masses or organomegaly Neurological - screening mental status exam normal, neck supple without rigidity, cranial nerves II through XII intact, DTR's normal and symmetric Extremities -2+ pitting pedal edema noted, right more than left, intact peripheral pulses  Skin - warm, dry  Data Review:    CBC Recent Labs  Lab 05/04/20 1357 05/08/20 1223  WBC  --  5.6  HGB 9.1* 9.1*  HCT  --  29.9*  PLT  --  183  MCV  --  96.5  MCH  --  29.4  MCHC  --  30.4  RDW  --  15.4   ------------------------------------------------------------------------------------------------------------------  Chemistries  Recent Labs  Lab 05/08/20 1223  NA 138  K 3.8  CL 100  CO2 26  GLUCOSE 116*  BUN 67*  CREATININE 4.33*  CALCIUM 8.8*  MG 1.9    ------------------------------------------------------------------------------------------------------------------ CrCl cannot be calculated (Unknown ideal weight.). ------------------------------------------------------------------------------------------------------------------ No results for input(s): TSH, T4TOTAL, T3FREE, THYROIDAB in the last 72 hours.  Invalid input(s): FREET3   Coagulation profile No results for input(s): INR, PROTIME in the last 168 hours. ------------------------------------------------------------------------------------------------------------------- No results for input(s): DDIMER in the last 72 hours. -------------------------------------------------------------------------------------------------------------------  Cardiac Enzymes No results for input(s): CKMB, TROPONINI, MYOGLOBIN in the last 168 hours.  Invalid input(s): CK ------------------------------------------------------------------------------------------------------------------    Component Value Date/Time   BNP 1,757.0 (H) 05/08/2020 1226    Urinalysis    Component Value Date/Time   COLORURINE YELLOW 02/09/2020 Hydro 02/09/2020 0958   LABSPEC 1.012 02/09/2020 0958   PHURINE 5.0 02/09/2020 0958   GLUCOSEU NEGATIVE 02/09/2020 0958   HGBUR NEGATIVE 02/09/2020 0958   BILIRUBINUR NEGATIVE 02/09/2020 0958   KETONESUR NEGATIVE 02/09/2020 0958   PROTEINUR 100 (A) 02/09/2020 0958   NITRITE NEGATIVE 02/09/2020 0958   LEUKOCYTESUR NEGATIVE 02/09/2020 0958    ----------------------------------------------------------------------------------------------------------------   Imaging Results:    DG Chest Port 1 View  Result Date: 05/08/2020 CLINICAL DATA:  Shortness of breath and bradycardia for 3 days, pending COVID test EXAM: PORTABLE CHEST 1 VIEW COMPARISON:  Portable exam 1319 hours compared to 02/07/2020 FINDINGS: Upper normal size of cardiac silhouette post median  sternotomy. Atherosclerotic calcification aorta. Mediastinal contours and pulmonary vascularity normal. Peribronchial thickening with accentuated perihilar markings question infiltrate or edema. No pleural effusion or pneumothorax. No acute osseous findings. IMPRESSION: Peribronchial thickening with BILATERAL interstitial infiltrates question atypical infection versus edema. Electronically Signed   By: Lavonia Dana M.D.   On: 05/08/2020 13:26    Radiological Exams on Admission: DG Chest Port 1 View  Result Date: 05/08/2020 CLINICAL DATA:  Shortness of breath and bradycardia for 3 days, pending COVID test EXAM: PORTABLE CHEST 1 VIEW COMPARISON:  Portable exam 1319 hours compared to 02/07/2020 FINDINGS: Upper normal size of cardiac silhouette post median sternotomy. Atherosclerotic calcification aorta. Mediastinal contours and pulmonary vascularity normal. Peribronchial thickening with accentuated perihilar markings question infiltrate or edema. No pleural effusion or pneumothorax. No acute osseous findings. IMPRESSION: Peribronchial thickening with BILATERAL interstitial infiltrates question atypical infection versus edema. Electronically Signed   By: Lavonia Dana M.D.   On: 05/08/2020 13:26    DVT Prophylaxis -SCD/heparin AM Labs Ordered, also please review Full Orders  Family Communication: Admission, patients condition and plan of care including tests being ordered have been discussed with the patient  who indicate understanding and agree with the plan   Code Status - Full Code  Likely DC to transfer to [name] to get cardiology input-  Condition   stable,  Roxan Hockey M.D on 05/08/2020 at 2:45 PM Go to [email or web address] -  for contact info  [facility] - Office  [phone]

## 2020-05-08 NOTE — ED Triage Notes (Signed)
Pt c/o shortness of breath and bradycardia  x3 days.  Pt has edema in his right leg and has increased his lasix from 40 mg bid to 60 mg in the morning and 40 in the evening.

## 2020-05-08 NOTE — ED Provider Notes (Signed)
[facility] EMERGENCY DEPARTMENT Provider Note   CSN: [id number] Arrival date & time: 05/08/20  1133     History Chief Complaint  Patient presents with  . Shortness of Breath    [name] is a 74 y.o. male.  74 yo male with complaint of SHOB x 3 days, worse with exertion. Patient has increased lasix without improvement, increased 3 days ago, baseline 40 BID, has increased to 60mg  QAM, 40mg  QPM. Also reports checking a radial pulse for 1 minute with pulse rate in the 30s. Right leg is swollen, states it typically swells more than the left but is more swollen currently. Denies chest pain. Sleeps in bed with 1 pillow, no orthopnea. Denies fever, cough, sick contacts. Not normally on home O2, had sat of 81% in the ER and was placed on 2L [place], currently with 94% sat. History of COPD, denies wheezing. History of kidney disease, has fistula, not on dialysis currently. History CHF, EF 05/2019 60-65%.         Past Medical History:  Diagnosis Date  . Acute myocardial infarction of other inferior wall, initial episode of care   . Anemia   . Anginal pain ([place])   . Anxiety   . Arthritis   . Cancer [facility])    bladder 2013 removed, has cystoscopy 10/2016, has returned x 2  . COPD (chronic obstructive pulmonary disease) ([place])   . Coronary artery disease    Artery bypass graft Jan 2008  . Dyspnea   . Dysrhythmia   . GERD (gastroesophageal reflux disease)   . Heart murmur   . History of hiatal hernia   . Hyperlipidemia   . Hypertension   . Hypothyroidism   . PONV (postoperative nausea and vomiting)   . Sleep apnea   . Stroke ([place])   . Tobacco user     Patient Active Problem List   Diagnosis Date Noted  . Acute exacerbation of CHF (congestive heart failure) ([employer]) 05/08/2020  . Uremia in the setting of AKI on CKD V 02/07/2020  . CKD (chronic kidney disease), stage V ([place]) 02/07/2020  . Coronary artery disease involving native coronary artery of native heart without angina pectoris  12/16/2019  . 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 Gastroenteritis 12/16/2019  . History of bladder cancer 12/16/2019  . Hyperkalemia 12/16/2019  . (HFpEF) heart failure with preserved ejection fraction ([place]) 05/17/2019  . AKI (acute kidney injury) on CKD V 05/16/2019  . Proteinuria 03/28/2019  . Acute blood loss anemia 06/08/2018  . Anemia in chronic kidney disease 06/08/2018  . BPH (benign prostatic hyperplasia) 06/08/2018  . Coronary artery disease with unstable angina pectoris ([place]) 06/08/2018  . Elevated troponin I level 06/08/2018  . GIB (gastrointestinal bleeding) 06/07/2018  . Coronary artery disease involving autologous artery coronary bypass graft 06/29/2017  . 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 NSTEMI (non-ST elevated myocardial infarction) ([place]) 09/14/2016  . Chest pain 09/14/2016  . Obstructive sleep apnea 09/14/2016  . Acute myocardial infarction of other inferior wall, initial episode of care   . Unstable angina ([place]) 01/30/2014  . OVERWEIGHT/OBESITY 02/02/2010  . HYPERLIPIDEMIA, MIXED 01/22/2009  . OTHER CHEST PAIN 01/22/2009  . TOBACCO USER 01/17/2009  . Essential hypertension 01/17/2009    Past Surgical History:  Procedure Laterality Date  . APPENDECTOMY    . AV FISTULA PLACEMENT Right 02/11/2020   Procedure: RIGHT ARM BRACHIOCEPHALIC ARTERIOVENOUS (AV) FISTULA;  Surgeon: Angelia Mould, MD;  Location: [place];  Service: Vascular;  Laterality:  Right;  Marland Kitchen CARDIAC CATHETERIZATION    . CORONARY ARTERY BYPASS GRAFT  06/14/2006   x 5  . CORONARY ARTERY BYPASS GRAFT N/A 09/19/2016   Procedure: REDO CORONARY ARTERY BYPASS GRAFTING (CABG) x two, using right internal mammary artery and left radial artery;  Surgeon: [name], MD;  Location: [place];  Service: Open Heart Surgery;  Laterality: N/A;  . FOOT SURGERY    . HERNIA REPAIR    . LEFT HEART CATH AND CORS/GRAFTS ANGIOGRAPHY N/A 09/14/2016   Procedure: Left Heart Cath and Cors/Grafts Angiography;   Surgeon: Troy Sine, MD;  Location: [place] CV LAB;  Service: Cardiovascular;  Laterality: N/A;  . LEFT HEART CATHETERIZATION WITH CORONARY/GRAFT ANGIOGRAM N/A 01/31/2014   Procedure: LEFT HEART CATHETERIZATION WITH Beatrix Fetters;  Surgeon: Sanda Klein, MD;  Location: [place] CATH LAB;  Service: Cardiovascular;  Laterality: N/A;  . NOSE SURGERY    . PERCUTANEOUS CORONARY STENT INTERVENTION (PCI-S)  01/31/2014   Procedure: PERCUTANEOUS CORONARY STENT INTERVENTION (PCI-S);  Surgeon: Sanda Klein, MD;  Location: [facility] CATH LAB;  Service: Cardiovascular;;  . RADIAL ARTERY HARVEST Left 09/19/2016   Procedure: RADIAL ARTERY HARVEST;  Surgeon: [name], MD;  Location: [place];  Service: Open Heart Surgery;  Laterality: Left;  . TEE WITHOUT CARDIOVERSION N/A 09/19/2016   Procedure: TRANSESOPHAGEAL ECHOCARDIOGRAM (TEE);  Surgeon: [name], MD;  Location: [place];  Service: Open Heart Surgery;  Laterality: N/A;       Family History  Problem Relation Age of Onset  . Heart attack Mother   . Heart attack Brother     Social History   Tobacco Use  . Smoking status: Former Smoker    Packs/day: 1.00    Years: 40.00    Pack years: 40.00    Types: Cigarettes    Start date: 02/20/1959    Quit date: 09/13/2016    Years since quitting: 3.6  . Smokeless tobacco: Never Used  . Tobacco comment: 10/7 2-8 cigarettes per day. cutting back- AJ  Vaping Use  . Vaping Use: Never used  Substance Use Topics  . Alcohol use: No    Alcohol/week: 0.0 standard drinks    Comment: "No, not really"  . Drug use: No    Home Medications Prior to Admission medications   Medication Sig Start Date End Date Taking? Authorizing Provider  acetaminophen (TYLENOL) 325 MG tablet Take 2 tablets (650 mg total) by mouth every 6 (six) hours as needed for mild pain. 09/23/16   Nani Skillern, PA-C  allopurinol (ZYLOPRIM) 100 MG tablet Take 100 mg by mouth daily.  05/03/19   [provider]   amLODipine (NORVASC) 10 MG tablet Take 1 tablet (10 mg total) by mouth daily. 05/18/19   Roxan Hockey, MD  atorvastatin (LIPITOR) 80 MG tablet Take 80 mg by mouth daily.    [provider]  carvedilol (COREG) 6.25 MG tablet Take 1 tablet (6.25 mg total) by mouth 2 (two) times daily. 05/18/19 05/17/20  Roxan Hockey, MD  clopidogrel (PLAVIX) 75 MG tablet Take 75 mg by mouth daily.    [provider]  epoetin alfa (EPOGEN) 20000 UNIT/ML injection Inject 20,000 Units into the skin every 14 (fourteen) days.    [provider]  famotidine (PEPCID) 40 MG tablet Take by mouth.    [provider]  finasteride (PROSCAR) 5 MG tablet Take 5 mg by mouth daily.    [provider]  folic acid (FOLVITE) 1 MG tablet Take by  mouth.    [provider]  furosemide (LASIX) 40 MG tablet Take 0.5 tablets (20 mg total) by mouth 2 (two) times daily. 02/11/20   Geradine Girt, DO  hydrALAZINE (APRESOLINE) 100 MG tablet Take 1 tablet (100 mg total) by mouth 3 (three) times daily. 05/18/19   Roxan Hockey, MD  isosorbide mononitrate (IMDUR) 120 MG 24 hr tablet Take 1 tablet (120 mg total) by mouth daily. 05/18/19   Roxan Hockey, MD  levothyroxine (SYNTHROID, LEVOTHROID) 50 MCG tablet Take 50 mcg by mouth daily before breakfast.    [provider]  nitroGLYCERIN (NITROSTAT) 0.4 MG SL tablet Place 0.4 mg under the tongue every 5 (five) minutes as needed for chest pain.    [provider]  Omega-3 Fatty Acids (RA FISH OIL) 1000 MG CAPS Take 2,000 mg by mouth 2 (two) times daily.     [provider]  omeprazole (PRILOSEC) 20 MG capsule Take 20 mg by mouth 2 (two) times daily. 02/14/20   [provider]  oxyCODONE-acetaminophen (PERCOCET) 5-325 MG tablet Take 1 tablet by mouth every 6 (six) hours as needed for severe pain. 02/11/20   Rhyne, Hulen Shouts, PA-C  potassium chloride (KLOR-CON) 8 MEQ tablet Take 8 mEq by mouth daily. 10/23/19    [provider]  sodium bicarbonate 650 MG tablet Take 1 tablet (650 mg total) by mouth 2 (two) times daily. 02/11/20   Geradine Girt, DO  tamsulosin (FLOMAX) 0.4 MG CAPS capsule Take 0.4 mg by mouth daily.    [provider]  Vitamin D, Cholecalciferol, 25 MCG (1000 UT) CAPS Take 1 capsule by mouth daily.     [provider]    Allergies    Cardizem cd [diltiazem hcl er beads]  Review of Systems   Review of Systems  Constitutional: Negative for chills and fever.  Respiratory: Positive for shortness of breath. Negative for cough and wheezing.   Cardiovascular: Positive for leg swelling. Negative for chest pain and palpitations.  Gastrointestinal: Negative for abdominal pain, nausea and vomiting.  Genitourinary: Negative for difficulty urinating.  Musculoskeletal: Negative for arthralgias and myalgias.  Skin: Negative for color change and wound.  Neurological: Negative for dizziness and weakness.  Psychiatric/Behavioral: Negative for confusion.  All other systems reviewed and are negative.   Physical Exam Updated Vital Signs BP (!) 142/46   Pulse 69   Temp 97.7 F (36.5 C) (Oral)   Resp 20   SpO2 92%   Physical Exam Vitals and nursing note reviewed.  Constitutional:      General: He is not in acute distress.    Appearance: He is well-developed and well-nourished. He is not diaphoretic.  HENT:     Head: Normocephalic and atraumatic.  Cardiovascular:     Rate and Rhythm: Regular rhythm. Bradycardia present.     Heart sounds: No murmur heard.   Pulmonary:     Effort: Tachypnea present.     Breath sounds: Normal breath sounds. No decreased breath sounds, wheezing or rhonchi.  Chest:     Chest wall: No tenderness.  Abdominal:     Palpations: Abdomen is soft.     Tenderness: There is no abdominal tenderness.  Musculoskeletal:     Right lower leg: No edema.     Left lower leg: Edema present.     Comments: Mild pitting edema bilateral lower  extremities   Skin:    General: Skin is warm and dry.     Findings: No erythema or rash.  Neurological:     Mental Status: He is alert and oriented to person, place, and time.  Psychiatric:        Mood and Affect: Mood and affect normal.        Behavior: Behavior normal.     ED Results / Procedures / Treatments   Labs (all labs ordered are listed, but only abnormal results are displayed) Labs Reviewed  BASIC METABOLIC PANEL - Abnormal; Notable for the following components:      Result Value   Glucose, Bld 116 (*)    BUN 67 (*)    Creatinine, Ser 4.33 (*)    Calcium 8.8 (*)    GFR, Estimated 14 (*)    All other components within normal limits  CBC - Abnormal; Notable for the following components:   RBC 3.10 (*)    Hemoglobin 9.1 (*)    HCT 29.9 (*)    All other components within normal limits  BRAIN NATRIURETIC PEPTIDE - Abnormal; Notable for the following components:   B Natriuretic Peptide 1,757.0 (*)    All other components within normal limits  TROPONIN I (HIGH SENSITIVITY) - Abnormal; Notable for the following components:   Troponin I (High Sensitivity) 27 (*)    All other components within normal limits  RESP PANEL BY RT-PCR (FLU A&B, COVID) ARPGX2  MAGNESIUM  TROPONIN I (HIGH SENSITIVITY)    EKG EKG Interpretation  Date/Time:  Friday May 08 2020 12:01:49 EST Ventricular Rate:  92 PR Interval:    QRS Duration: 96 QT Interval:  397 QTC Calculation: 362 R Axis:   24 Text Interpretation: Sinus rhythm Ventricular bigeminy Nonspecific T abnormalities, lateral leads Confirmed by Aletta Edouard [id number]) on 05/08/2020 12:24:11 PM   Radiology DG Chest Port 1 View  Result Date: 05/08/2020 CLINICAL DATA:  Shortness of breath and bradycardia for 3 days, pending COVID test EXAM: PORTABLE CHEST 1 VIEW COMPARISON:  Portable exam 1319 hours compared to 02/07/2020 FINDINGS: Upper normal size of cardiac silhouette post median sternotomy. Atherosclerotic calcification  aorta. Mediastinal contours and pulmonary vascularity normal. Peribronchial thickening with accentuated perihilar markings question infiltrate or edema. No pleural effusion or pneumothorax. No acute osseous findings. IMPRESSION: Peribronchial thickening with BILATERAL interstitial infiltrates question atypical infection versus edema. Electronically Signed   By: Lavonia Dana M.D.   On: 05/08/2020 13:26    Procedures .Critical Care Performed by: Tacy Learn, PA-C Authorized by: Tacy Learn, PA-C   Critical care provider statement:    Critical care time (minutes):  45   Critical care was time spent personally by me on the following activities:  Discussions with consultants, evaluation of patient's response to treatment, examination of patient, ordering and performing treatments and interventions, ordering and review of laboratory studies, ordering and review of radiographic studies, pulse oximetry, re-evaluation of patient's condition, obtaining history from patient or surrogate and review of old charts   (including critical care time)  Medications Ordered in ED Medications  furosemide (LASIX) injection 80 mg (80 mg Intravenous Given 05/08/20 1410)  potassium chloride SA (KLOR-CON) CR tablet 20 mEq (20 mEq Oral Given 05/08/20 1410)    ED Course  I have reviewed the triage vital signs and the nursing notes.  Pertinent labs & imaging results that were available during my care of the patient were reviewed by me and considered in my medical decision making (see chart for details).  Clinical Course as of 05/08/20 1440  Fri May 08, 6484  585 74 year old male with history of coronary  disease CKD here with increased shortness of breath over the last few days.  He is also noticed his pulse rate is low.  No recent medication changes.  On the monitor he is having ventricular bigeminy and trigeminy.  Blood pressure okay here.  Has an oxygen requirement.  Will need admission for further work-up.  [MB]  1401 Cardiology paged for consult. Discussed with Dr. Melina Copa, ER attending, plan is for dose of Lasix in the ER, consult cardiology, consider admission to [facility] vs [facility]. [LM]  1402 Labs reviewed, CBC with anemia unchanged/improved compared to prior (9.1 today, previously in the 8 range), BMP with Slight increase in Cr to 4.33 today (previously 3.4-3.9) with normal K. COVID/flu negative. Troponin 27 (previously 31). CXR with edema vs atypical infection. BNP pending.  [LM]  1404 BNP 1757 Case discussed with Dr. Oval Linsey with cardiology, recommends treat as CHF exacerbation, does not need transfer to [facility] at this time. Consult hospitalist for admission.  [LM]  2549 Discussed with Dr. Denton Brick with [facility] hospitalist service who will consult for admission. [LM]    Clinical Course User Index [LM] Tacy Learn, PA-C [MB] Hayden Rasmussen, MD   MDM Rules/Calculators/A&P                          Final Clinical Impression(s) / ED Diagnoses Final diagnoses:  Acute on chronic congestive heart failure, unspecified heart failure type [facility])  Hypoxemia  Ventricular bigeminy seen on cardiac monitor    Rx / DC Orders ED Discharge Orders    None       Tacy Learn, PA-C 05/08/20 1440    Hayden Rasmussen, MD 05/08/20 Pauline Aus

## 2020-05-08 NOTE — Progress Notes (Addendum)
Received patient to room 3E-02 around 1720. He is A&O x 4, resps even and unlabored at rest, but becomes tachypneic with minimal activity. Lung sounds are clear on the right side but diminished on the left posteriorly only. HR slightly irregular and distant. CABG scar to center chest. Abdomen soft, nontender. Reports last BM yesterday 05/07/20.Patient has 2+ pitting edema to BLE but no other skin issues. 20G PIV in left AC, flushes well. There is also a fistula in the right upper forearm for eventual dialysis (patient has CKD) but it has never been accessed. Patient arrived with O2 at 2L via [place] but wanted to take it off for awhile- satting 96% on room air at this time. Assisted patient in ordering dinner, oriented to room. Denies pain or other needs at this time.

## 2020-05-08 NOTE — ED Notes (Signed)
Pt oxygen dropped to 81%. Pt placed on 2 L [place]. Tolerating well

## 2020-05-09 ENCOUNTER — Observation Stay (HOSPITAL_COMMUNITY): Payer: No Typology Code available for payment source

## 2020-05-09 DIAGNOSIS — J449 Chronic obstructive pulmonary disease, unspecified: Secondary | ICD-10-CM | POA: Diagnosis present

## 2020-05-09 DIAGNOSIS — Z87891 Personal history of nicotine dependence: Secondary | ICD-10-CM | POA: Diagnosis not present

## 2020-05-09 DIAGNOSIS — I132 Hypertensive heart and chronic kidney disease with heart failure and with stage 5 chronic kidney disease, or end stage renal disease: Secondary | ICD-10-CM | POA: Diagnosis present

## 2020-05-09 DIAGNOSIS — K219 Gastro-esophageal reflux disease without esophagitis: Secondary | ICD-10-CM | POA: Diagnosis present

## 2020-05-09 DIAGNOSIS — E785 Hyperlipidemia, unspecified: Secondary | ICD-10-CM | POA: Diagnosis present

## 2020-05-09 DIAGNOSIS — I5031 Acute diastolic (congestive) heart failure: Secondary | ICD-10-CM | POA: Diagnosis not present

## 2020-05-09 DIAGNOSIS — R0602 Shortness of breath: Secondary | ICD-10-CM

## 2020-05-09 DIAGNOSIS — N185 Chronic kidney disease, stage 5: Secondary | ICD-10-CM

## 2020-05-09 DIAGNOSIS — Z8249 Family history of ischemic heart disease and other diseases of the circulatory system: Secondary | ICD-10-CM | POA: Diagnosis not present

## 2020-05-09 DIAGNOSIS — R001 Bradycardia, unspecified: Secondary | ICD-10-CM | POA: Diagnosis present

## 2020-05-09 DIAGNOSIS — J9601 Acute respiratory failure with hypoxia: Secondary | ICD-10-CM | POA: Diagnosis present

## 2020-05-09 DIAGNOSIS — Z20822 Contact with and (suspected) exposure to covid-19: Secondary | ICD-10-CM | POA: Diagnosis present

## 2020-05-09 DIAGNOSIS — E039 Hypothyroidism, unspecified: Secondary | ICD-10-CM | POA: Diagnosis present

## 2020-05-09 DIAGNOSIS — D631 Anemia in chronic kidney disease: Secondary | ICD-10-CM

## 2020-05-09 DIAGNOSIS — I351 Nonrheumatic aortic (valve) insufficiency: Secondary | ICD-10-CM | POA: Diagnosis not present

## 2020-05-09 DIAGNOSIS — K8020 Calculus of gallbladder without cholecystitis without obstruction: Secondary | ICD-10-CM | POA: Diagnosis present

## 2020-05-09 DIAGNOSIS — K760 Fatty (change of) liver, not elsewhere classified: Secondary | ICD-10-CM | POA: Diagnosis present

## 2020-05-09 DIAGNOSIS — N400 Enlarged prostate without lower urinary tract symptoms: Secondary | ICD-10-CM | POA: Diagnosis present

## 2020-05-09 DIAGNOSIS — Z8673 Personal history of transient ischemic attack (TIA), and cerebral infarction without residual deficits: Secondary | ICD-10-CM | POA: Diagnosis not present

## 2020-05-09 DIAGNOSIS — I2510 Atherosclerotic heart disease of native coronary artery without angina pectoris: Secondary | ICD-10-CM | POA: Diagnosis present

## 2020-05-09 DIAGNOSIS — I5033 Acute on chronic diastolic (congestive) heart failure: Secondary | ICD-10-CM | POA: Diagnosis present

## 2020-05-09 DIAGNOSIS — F419 Anxiety disorder, unspecified: Secondary | ICD-10-CM | POA: Diagnosis present

## 2020-05-09 DIAGNOSIS — I25810 Atherosclerosis of coronary artery bypass graft(s) without angina pectoris: Secondary | ICD-10-CM | POA: Diagnosis present

## 2020-05-09 DIAGNOSIS — I252 Old myocardial infarction: Secondary | ICD-10-CM | POA: Diagnosis not present

## 2020-05-09 DIAGNOSIS — Z888 Allergy status to other drugs, medicaments and biological substances status: Secondary | ICD-10-CM | POA: Diagnosis not present

## 2020-05-09 DIAGNOSIS — I340 Nonrheumatic mitral (valve) insufficiency: Secondary | ICD-10-CM

## 2020-05-09 DIAGNOSIS — R609 Edema, unspecified: Secondary | ICD-10-CM | POA: Diagnosis not present

## 2020-05-09 DIAGNOSIS — I361 Nonrheumatic tricuspid (valve) insufficiency: Secondary | ICD-10-CM

## 2020-05-09 DIAGNOSIS — Z79899 Other long term (current) drug therapy: Secondary | ICD-10-CM | POA: Diagnosis not present

## 2020-05-09 DIAGNOSIS — I350 Nonrheumatic aortic (valve) stenosis: Secondary | ICD-10-CM | POA: Diagnosis not present

## 2020-05-09 DIAGNOSIS — Z79890 Hormone replacement therapy (postmenopausal): Secondary | ICD-10-CM | POA: Diagnosis not present

## 2020-05-09 LAB — ECHOCARDIOGRAM COMPLETE
AR max vel: 1.63 cm2
AV Area VTI: 1.66 cm2
AV Area mean vel: 1.59 cm2
AV Mean grad: 18 mmHg
AV Peak grad: 31.4 mmHg
Ao pk vel: 2.8 m/s
Area-P 1/2: 2.66 cm2
Height: 69 in
MV M vel: 6.39 m/s
MV Peak grad: 163.3 mmHg
P 1/2 time: 465 msec
S' Lateral: 3.7 cm
Weight: 3091.2 oz

## 2020-05-09 LAB — BASIC METABOLIC PANEL
Anion gap: 13 (ref 5–15)
Anion gap: 12 (ref 5–15)
BUN: 70 mg/dL — ABNORMAL HIGH (ref 8–23)
BUN: 67 mg/dL — ABNORMAL HIGH (ref 8–23)
CO2: 25 mmol/L (ref 22–32)
CO2: 24 mmol/L (ref 22–32)
Calcium: 9.1 mg/dL (ref 8.9–10.3)
Calcium: 8.8 mg/dL — ABNORMAL LOW (ref 8.9–10.3)
Chloride: 100 mmol/L (ref 98–111)
Chloride: 99 mmol/L (ref 98–111)
Creatinine, Ser: 4.32 mg/dL — ABNORMAL HIGH (ref 0.61–1.24)
Creatinine, Ser: 4.29 mg/dL — ABNORMAL HIGH (ref 0.61–1.24)
GFR, Estimated: 14 mL/min — ABNORMAL LOW (ref >60)
GFR, Estimated: 14 mL/min — ABNORMAL LOW (ref >60)
Glucose, Bld: 86 mg/dL (ref 70–99)
Glucose, Bld: 176 mg/dL — ABNORMAL HIGH (ref 70–99)
Potassium: 4.4 mmol/L (ref 3.5–5.1)
Potassium: 4 mmol/L (ref 3.5–5.1)
Sodium: 138 mmol/L (ref 135–145)
Sodium: 135 mmol/L (ref 135–145)

## 2020-05-09 LAB — CBC
HCT: 28.1 % — ABNORMAL LOW (ref 39.0–52.0)
Hemoglobin: 8.9 g/dL — ABNORMAL LOW (ref 13.0–17.0)
MCH: 29 pg (ref 26.0–34.0)
MCHC: 31.7 g/dL (ref 30.0–36.0)
MCV: 91.5 fL (ref 80.0–100.0)
Platelets: 183 10*3/uL (ref 150–400)
RBC: 3.07 MIL/uL — ABNORMAL LOW (ref 4.22–5.81)
RDW: 15.2 % (ref 11.5–15.5)
WBC: 7.4 10*3/uL (ref 4.0–10.5)
nRBC: 0 % (ref 0.0–0.2)

## 2020-05-09 LAB — MAGNESIUM: Magnesium: 1.7 mg/dL (ref 1.7–2.4)

## 2020-05-09 MED ORDER — CARVEDILOL 3.125 MG PO TABS
3.1250 mg | ORAL_TABLET | Freq: Two times a day (BID) | ORAL | Status: DC
  Administered 2020-05-09 – 2020-05-10 (×3): 3.125 mg via ORAL
  Filled 2020-05-09 (×3): qty 1

## 2020-05-09 NOTE — Progress Notes (Signed)
  Echocardiogram 2D Echocardiogram has been performed.  [name] 05/09/2020, 8:33 AM

## 2020-05-09 NOTE — Progress Notes (Signed)
Patient continues to have frequent ectopy. Mag level result was 1.7. Patient remains asymptomatic, physician aware. Will continue to monitor.

## 2020-05-09 NOTE — Progress Notes (Signed)
Sent message to cardiologist for patient's persistent PVCs, to include bigeminy and trigeminy after every one normal beat. Patient is asymptomatic at this time, rate in the 80s.

## 2020-05-09 NOTE — Progress Notes (Signed)
Patient is noted to have returned to a NSR without PVC's/ectopy sustained now since approximately 1620. Will continue to monitor.

## 2020-05-09 NOTE — Plan of Care (Signed)
Care plans initiated for patient

## 2020-05-09 NOTE — Progress Notes (Signed)
PROGRESS NOTE    [name]   GNF:[id number]  DOB: 07-Nov-1945  DOA: 05/08/2020 PCP: [facility], [facility]   Brief Narrative:  [name]  is a 74 y.o. male 74 y.o.malewith past medical history relevant for CAD(s/p prior CABG), COPD, HTN, BPHand CKD Vas well as chronic anemia of CKD who presents to the ED with worsening dyspnea at rest and dyspnea on exertion for at least 3 days  Patient primary complaint is worsening dyspnea on exertion, with increasing lower extremity edema especially on the right. He increased his lasix from 40 mg bid to 60 mg in the morning and 40 in the evening, however, dyspnea on exertion persisted. He also complained of dyspnea when laying flat.    In the ED he was noted to by hypoxic and bradycardic. Pulse ox was 81 % and HR dropped into low 30s. He was admitted to be treated for acute CHF and bradycardia. His Coreg was held and he was transferred from the [facility] ED to [facility] hospital as no cardiologist was present at [facility] during this Christmas weekend.    Subjective: He feels better in regards to shorteness of breath but not back to baseline. His ankle swelling has not resolved. His right leg remains more swollen than the left.     Assessment & Plan:   Principal Problem:   Acute exacerbation of Diastolic CHF (congestive heart failure)    Acute respiratory failure with hypoxia  -Last echo on 1/21 showed a normal EF and grade 1 diastolic heart failure -Patient was started on IV diuretics and has diuresed a little over 1 L overnight however, does not appear to be back to his baseline just yet -Continue IV diuretics -2D echo is being repeated  Active Problems: Bradycardia - Carvedilol was held and heart rate is now in the 70s at rest -We will resume carvedilol at 3.125 mg twice daily (home dose of 6.25 mg twice daily)  Hypertension -Uncontrolled -Resuming carvedilol today -Allowed BP to be elevated today to help with diuresis and will subsequently  add more medications once adequately diuresed -Needs better control of BP to help prevent future congestive heart failure exacerbations   COPD without exacerbation (HCC)---40 Pack Years -No COPD exacerbation noted  CAD status post CABG -Status post left heart cath in 2015 with stenting -Continue Lipitor Plavix    CKD (chronic kidney disease), stage V  -The patient has a right arm AV fistula-has not yet required dialysis -As he is diuresing well with Lasix, it does not appear that he will need dialysis on this admission -Of note he was last admitted on 02/11/2020 for AKI    Anemia in chronic kidney disease -Globin appears stable-follow  BPH -Continue home meds    Time spent in minutes: 35 DVT prophylaxis: heparin injection 5,000 Units Start: 05/08/20 2200 SCDs Start: 05/08/20 1614 Place TED hose Start: 05/08/20 1614  Code Status: Full code Family Communication:  Disposition Plan:  Status is: Inpatient  The patient will require care spanning > 2 midnights and should be moved to inpatient because: IV treatments appropriate due to intensity of illness or inability to take PO  Dispo: The patient is from: Home              Anticipated d/c is to: Home              Anticipated d/c date is: 2 days              Patient currently is not  medically stable to d/c. Consultants:   None Procedures:   None Antimicrobials:  Anti-infectives (From admission, onward)   None       Objective: Vitals:   05/08/20 1912 05/08/20 2316 05/09/20 0125 05/09/20 0454  BP: (!) 150/60 (!) 156/52  (!) 159/54  Pulse: 64 73  85  Resp: 16   16  Temp: 97.6 F (36.4 C) 97.7 F (36.5 C)  98.3 F (36.8 C)  TempSrc: Oral Oral  Oral  SpO2: 96% 96%  93%  Weight:   87.6 kg   Height:        Intake/Output Summary (Last 24 hours) at 05/09/2020 1052 Last data filed at 05/09/2020 0947 Gross per 24 hour  Intake 1203 ml  Output 1900 ml  Net -697 ml   Filed Weights   05/08/20 1904 05/09/20 0125   Weight: 87.5 kg 87.6 kg    Examination: General exam: Appears comfortable  HEENT: PERRLA, oral mucosa moist, no sclera icterus or thrush Respiratory system: Crackles at bases. Respiratory effort normal. Cardiovascular system: S1 & S2 heard, RRR.   Gastrointestinal system: Abdomen soft, non-tender, nondistended. Normal bowel sounds. Central nervous system: Alert and oriented. No focal neurological deficits. Extremities: No cyanosis, clubbing - bilateral pedal edema - right greater than left Skin: No rashes or ulcers Psychiatry:  Mood & affect appropriate.     Data Reviewed: I have personally reviewed following labs and imaging studies  CBC: Recent Labs  Lab 05/04/20 1357 05/08/20 1223  WBC  --  5.6  HGB 9.1* 9.1*  HCT  --  29.9*  MCV  --  96.5  PLT  --  161   Basic Metabolic Panel: Recent Labs  Lab 05/08/20 1223 05/09/20 0833  NA 138 135  K 3.8 4.0  CL 100 99  CO2 26 24  GLUCOSE 116* 176*  BUN 67* 67*  CREATININE 4.33* 4.29*  CALCIUM 8.8* 8.8*  MG 1.9  --    GFR: Estimated Creatinine Clearance: 16.8 mL/min (A) (by C-G formula based on SCr of 4.29 mg/dL (H)). Liver Function Tests: No results for input(s): AST, ALT, ALKPHOS, BILITOT, PROT, ALBUMIN in the last 168 hours. No results for input(s): LIPASE, AMYLASE in the last 168 hours. No results for input(s): AMMONIA in the last 168 hours. Coagulation Profile: No results for input(s): INR, PROTIME in the last 168 hours. Cardiac Enzymes: No results for input(s): CKTOTAL, CKMB, CKMBINDEX, TROPONINI in the last 168 hours. BNP (last 3 results) No results for input(s): PROBNP in the last 8760 hours. HbA1C: No results for input(s): HGBA1C in the last 72 hours. CBG: No results for input(s): GLUCAP in the last 168 hours. Lipid Profile: No results for input(s): CHOL, HDL, LDLCALC, TRIG, CHOLHDL, LDLDIRECT in the last 72 hours. Thyroid Function Tests: No results for input(s): TSH, T4TOTAL, FREET4, T3FREE, THYROIDAB  in the last 72 hours. Anemia Panel: No results for input(s): VITAMINB12, FOLATE, FERRITIN, TIBC, IRON, RETICCTPCT in the last 72 hours. Urine analysis:    Component Value Date/Time   COLORURINE YELLOW 02/09/2020 0958   APPEARANCEUR CLEAR 02/09/2020 0958   LABSPEC 1.012 02/09/2020 0958   PHURINE 5.0 02/09/2020 0958   GLUCOSEU NEGATIVE 02/09/2020 0958   HGBUR NEGATIVE 02/09/2020 0958   BILIRUBINUR NEGATIVE 02/09/2020 0958   KETONESUR NEGATIVE 02/09/2020 0958   PROTEINUR 100 (A) 02/09/2020 0958   NITRITE NEGATIVE 02/09/2020 0958   LEUKOCYTESUR NEGATIVE 02/09/2020 0958   Sepsis Labs: @LABRCNTIP (procalcitonin:4,lacticidven:4) ) Recent Results (from the past 240 hour(s))  Resp Panel by RT-PCR (  Flu A&B, Covid) Nasopharyngeal Swab     Status: None   Collection Time: 05/08/20 12:24 PM   Specimen: Nasopharyngeal Swab; Nasopharyngeal(NP) swabs in vial transport medium  Result Value Ref Range Status   SARS Coronavirus 2 by RT PCR NEGATIVE NEGATIVE Final    Comment: (NOTE) SARS-CoV-2 target nucleic acids are NOT DETECTED.  The SARS-CoV-2 RNA is generally detectable in upper respiratory specimens during the acute phase of infection. The lowest concentration of SARS-CoV-2 viral copies this assay can detect is 138 copies/mL. A negative result does not preclude SARS-Cov-2 infection and should not be used as the sole basis for treatment or other patient management decisions. A negative result may occur with  improper specimen collection/handling, submission of specimen other than nasopharyngeal swab, presence of viral mutation(s) within the areas targeted by this assay, and inadequate number of viral copies(<138 copies/mL). A negative result must be combined with clinical observations, patient history, and epidemiological information. The expected result is Negative.  Fact Sheet for Patients:  EntrepreneurPulse.com.au  Fact Sheet for Healthcare Providers:   IncredibleEmployment.be  This test is no t yet approved or cleared by the [place] FDA and  has been authorized for detection and/or diagnosis of SARS-CoV-2 by FDA under an Emergency Use Authorization (EUA). This EUA will remain  in effect (meaning this test can be used) for the duration of the COVID-19 declaration under Section 564(b)(1) of the Act, 21 U.S.C.section 360bbb-3(b)(1), unless the authorization is terminated  or revoked sooner.       Influenza A by PCR NEGATIVE NEGATIVE Final   Influenza B by PCR NEGATIVE NEGATIVE Final    Comment: (NOTE) The Xpert Xpress SARS-CoV-2/FLU/RSV plus assay is intended as an aid in the diagnosis of influenza from Nasopharyngeal swab specimens and should not be used as a sole basis for treatment. Nasal washings and aspirates are unacceptable for Xpert Xpress SARS-CoV-2/FLU/RSV testing.  Fact Sheet for Patients: EntrepreneurPulse.com.au  Fact Sheet for Healthcare Providers: IncredibleEmployment.be  This test is not yet approved or cleared by the [place] FDA and has been authorized for detection and/or diagnosis of SARS-CoV-2 by FDA under an Emergency Use Authorization (EUA). This EUA will remain in effect (meaning this test can be used) for the duration of the COVID-19 declaration under Section 564(b)(1) of the Act, 21 U.S.C. section 360bbb-3(b)(1), unless the authorization is terminated or revoked.  Performed at [facility], [address] [place], [place] [address]          Radiology Studies: [facility] 1 View  Result Date: 05/08/2020 CLINICAL DATA:  Shortness of breath and bradycardia for 3 days, pending COVID test EXAM: PORTABLE CHEST 1 VIEW COMPARISON:  Portable exam 1319 hours compared to 02/07/2020 FINDINGS: Upper normal size of cardiac silhouette post median sternotomy. Atherosclerotic calcification aorta. Mediastinal contours and pulmonary vascularity  normal. Peribronchial thickening with accentuated perihilar markings question infiltrate or edema. No pleural effusion or pneumothorax. No acute osseous findings. IMPRESSION: Peribronchial thickening with BILATERAL interstitial infiltrates question atypical infection versus edema. Electronically Signed   By: Lavonia Dana M.D.   On: 05/08/2020 13:26      Scheduled Meds: . allopurinol  100 mg Oral Daily  . amLODipine  10 mg Oral Daily  . atorvastatin  80 mg Oral Daily  . carvedilol  3.125 mg Oral BID  . cholecalciferol  1,000 Units Oral Daily  . clopidogrel  75 mg Oral Daily  . finasteride  5 mg Oral Daily  . folic acid  1 mg Oral  Daily  . furosemide  80 mg Intravenous Q12H  . heparin  5,000 Units Subcutaneous Q8H  . hydrALAZINE  100 mg Oral TID  . isosorbide mononitrate  120 mg Oral Daily  . levothyroxine  50 mcg Oral QAC breakfast  . multivitamin with minerals  1 tablet Oral Daily  . omega-3 acid ethyl esters  2 g Oral BID  . pantoprazole  40 mg Oral Daily  . sodium chloride flush  3 mL Intravenous Q12H  . sodium chloride flush  3 mL Intravenous Q12H  . tamsulosin  0.4 mg Oral Daily  . umeclidinium bromide  1 puff Inhalation Daily   Continuous Infusions: . sodium chloride       LOS: 0 days      Debbe Odea, MD [facility] Pager: [email or web address] 05/09/2020, 10:52 AM

## 2020-05-10 ENCOUNTER — Inpatient Hospital Stay (HOSPITAL_COMMUNITY): Payer: No Typology Code available for payment source

## 2020-05-10 DIAGNOSIS — I5031 Acute diastolic (congestive) heart failure: Secondary | ICD-10-CM | POA: Diagnosis not present

## 2020-05-10 DIAGNOSIS — R001 Bradycardia, unspecified: Secondary | ICD-10-CM | POA: Diagnosis not present

## 2020-05-10 DIAGNOSIS — I10 Essential (primary) hypertension: Secondary | ICD-10-CM

## 2020-05-10 DIAGNOSIS — R609 Edema, unspecified: Secondary | ICD-10-CM

## 2020-05-10 DIAGNOSIS — Z951 Presence of aortocoronary bypass graft: Secondary | ICD-10-CM

## 2020-05-10 DIAGNOSIS — I25810 Atherosclerosis of coronary artery bypass graft(s) without angina pectoris: Secondary | ICD-10-CM

## 2020-05-10 DIAGNOSIS — N185 Chronic kidney disease, stage 5: Secondary | ICD-10-CM | POA: Diagnosis not present

## 2020-05-10 DIAGNOSIS — I5033 Acute on chronic diastolic (congestive) heart failure: Secondary | ICD-10-CM | POA: Diagnosis not present

## 2020-05-10 LAB — BASIC METABOLIC PANEL
Anion gap: 11 (ref 5–15)
BUN: 75 mg/dL — ABNORMAL HIGH (ref 8–23)
CO2: 26 mmol/L (ref 22–32)
Calcium: 8.7 mg/dL — ABNORMAL LOW (ref 8.9–10.3)
Chloride: 99 mmol/L (ref 98–111)
Creatinine, Ser: 4.45 mg/dL — ABNORMAL HIGH (ref 0.61–1.24)
GFR, Estimated: 13 mL/min — ABNORMAL LOW (ref >60)
Glucose, Bld: 95 mg/dL (ref 70–99)
Potassium: 4.1 mmol/L (ref 3.5–5.1)
Sodium: 136 mmol/L (ref 135–145)

## 2020-05-10 MED ORDER — CARVEDILOL 3.125 MG PO TABS: 3.1250 mg | ORAL_TABLET | Freq: Two times a day (BID) | ORAL | 3 refills | Status: DC

## 2020-05-10 MED ORDER — FUROSEMIDE 40 MG PO TABS: 40.0000 mg | ORAL_TABLET | ORAL | Status: DC

## 2020-05-10 NOTE — Plan of Care (Signed)
  Problem: Clinical Measurements: Goal: Cardiovascular complication will be avoided Outcome: Progressing   Problem: Education: Goal: Knowledge of General Education information will improve Description: Including pain rating scale, medication(s)/side effects and non-pharmacologic comfort measures Outcome: Adequate for Discharge   Problem: Health Behavior/Discharge Planning: Goal: Ability to manage health-related needs will improve Outcome: Adequate for Discharge   Problem: Clinical Measurements: Goal: Ability to maintain clinical measurements within normal limits will improve Outcome: Adequate for Discharge Goal: Will remain free from infection Outcome: Adequate for Discharge Goal: Diagnostic test results will improve Outcome: Adequate for Discharge   Problem: Activity: Goal: Risk for activity intolerance will decrease Outcome: Adequate for Discharge   Problem: Nutrition: Goal: Adequate nutrition will be maintained Outcome: Adequate for Discharge   Problem: Coping: Goal: Level of anxiety will decrease Outcome: Adequate for Discharge   Problem: Elimination: Goal: Will not experience complications related to bowel motility Outcome: Adequate for Discharge Goal: Will not experience complications related to urinary retention Outcome: Adequate for Discharge   Problem: Safety: Goal: Ability to remain free from injury will improve Outcome: Adequate for Discharge   Problem: Skin Integrity: Goal: Risk for impaired skin integrity will decrease Outcome: Adequate for Discharge   Problem: Education: Goal: Ability to demonstrate management of disease process will improve Outcome: Adequate for Discharge Goal: Ability to verbalize understanding of medication therapies will improve Outcome: Adequate for Discharge   Problem: Activity: Goal: Capacity to carry out activities will improve Outcome: Adequate for Discharge   Problem: Cardiac: Goal: Ability to achieve and maintain  adequate cardiopulmonary perfusion will improve Outcome: Adequate for Discharge

## 2020-05-10 NOTE — Progress Notes (Signed)
Lower extremity venous RT study completed.   Please see CV Proc for preliminary results.   Azzie Thiem, RDMS  

## 2020-05-10 NOTE — Discharge Instructions (Signed)
Heart Failure, Self Care °Heart failure is a serious condition. This sheet explains things you need to do to take care of yourself at home. To help you stay as healthy as possible, you may be asked to change your diet, take certain medicines, and make other changes in your life. Your doctor may also give you more specific instructions. If you have problems or questions, call your doctor. °What are the risks? °Having heart failure makes it more likely for you to have some problems. These problems can get worse if you do not take good care of yourself. Problems may include: °· Blood clotting problems. This may cause a stroke. °· Damage to the kidneys, liver, or lungs. °· Abnormal heart rhythms. °Supplies needed: °· Scale for weighing yourself. °· Blood pressure monitor. °· Notebook. °· Medicines. °How to care for yourself when you have heart failure °Medicines °Take over-the-counter and prescription medicines only as told by your doctor. Take your medicines every day. °· Do not stop taking your medicine unless your doctor tells you to do so. °· Do not skip any medicines. °· Get your prescriptions refilled before you run out of medicine. This is important. °Eating and drinking ° °· Eat heart-healthy foods. Talk with a diet specialist (dietitian) to create an eating plan. °· Choose foods that: °? Have no trans fat. °? Are low in saturated fat and cholesterol. °· Choose healthy foods, such as: °? Fresh or frozen fruits and vegetables. °? Fish. °? Low-fat (lean) meats. °? Legumes, such as beans, peas, and lentils. °? Fat-free or low-fat dairy products. °? Whole-grain foods. °? High-fiber foods. °· Limit salt (sodium) if told by your doctor. Ask your diet specialist to tell you which seasonings are healthy for your heart. °· Cook in healthy ways instead of frying. Healthy ways of cooking include roasting, grilling, broiling, baking, poaching, steaming, and stir-frying. °· Limit how much fluid you drink, if told by your  doctor. °Alcohol use °· Do not drink alcohol if: °? Your doctor tells you not to drink. °? Your heart was damaged by alcohol, or you have very bad heart failure. °? You are pregnant, may be pregnant, or are planning to become pregnant. °· If you drink alcohol: °? Limit how much you use to: °§ 0-1 drink a day for women. °§ 0-2 drinks a day for men. °? Be aware of how much alcohol is in your drink. In the [place] one drink equals one 12 oz bottle of beer (355 mL), one 5 oz glass of wine (148 mL), or one 1½ oz glass of hard liquor (44 mL). °Lifestyle ° °· Do not use any products that contain nicotine or tobacco, such as cigarettes, e-cigarettes, and chewing tobacco. If you need help quitting, ask your doctor. °? Do not use nicotine gum or patches before talking to your doctor. °· Do not use illegal drugs. °· Lose weight if told by your doctor. °· Do physical activity if told by your doctor. Talk to your doctor before you begin an exercise if: °? You are an older adult. °? You have very bad heart failure. °· Learn to manage stress. If you need help, ask your doctor. °· Get rehab (rehabilitation) to help you stay independent and to help with your quality of life. °· Plan time to rest when you get tired. °Check weight and blood pressure ° °· Weigh yourself every day. This will help you to know if fluid is building up in your body. °? Weigh yourself every morning   after you pee (urinate) and before you eat breakfast. ? Wear the same amount of clothing each time. ? Write down your daily weight. Give your record to your doctor.  Check and write down your blood pressure as told by your doctor.  Check your pulse as told by your doctor. Dealing with very hot and very cold weather  If it is very hot: ? Avoid activities that take a lot of energy. ? Use air conditioning or fans, or find a cooler place. ? Avoid caffeine and alcohol. ? Wear clothing that is loose-fitting, lightweight, and light-colored.  If it is very  cold: ? Avoid activities that take a lot of energy. ? Layer your clothes. ? Wear mittens or gloves, a hat, and a scarf when you go outside. ? Avoid alcohol. Follow these instructions at home:  Stay up to date with shots (vaccines). Get pneumococcal and flu (influenza) shots.  Keep all follow-up visits as told by your doctor. This is important. Contact a doctor if:  You gain weight quickly.  You have increasing shortness of breath.  You cannot do your normal activities.  You get tired easily.  You cough a lot.  You don't feel like eating or feel like you may vomit (nauseous).  You become puffy (swell) in your hands, feet, ankles, or belly (abdomen).  You cannot sleep well because it is hard to breathe.  You feel like your heart is beating fast (palpitations).  You get dizzy when you stand up. Get help right away if:  You have trouble breathing.  You or someone else notices a change in your behavior, such as having trouble staying awake.  You have chest pain or discomfort.  You pass out (faint). These symptoms may be an emergency. Do not wait to see if the symptoms will go away. Get medical help right away. Call your local emergency services (911 in the [place] Do not drive yourself to the hospital. Summary  Heart failure is a serious condition. To care for yourself, you may have to change your diet, take medicines, and make other lifestyle changes.  Take your medicines every day. Do not stop taking them unless your doctor tells you to do so.  Eat heart-healthy foods, such as fresh or frozen fruits and vegetables, fish, lean meats, legumes, fat-free or low-fat dairy products, and whole-grain or high-fiber foods.  Ask your doctor if you can drink alcohol. You may have to stop alcohol use if you have very bad heart failure.  Contact your doctor if you gain weight quickly or feel that your heart is beating too fast. Get help right away if you pass out, or have chest pain  or trouble breathing. This information is not intended to replace advice given to you by your health care provider. Make sure you discuss any questions you have with your health care provider. Document Revised: 08/14/2018 Document Reviewed: 08/15/2018 Elsevier Patient Education  [place].   Heart Failure Action Plan A heart failure action plan helps you understand what to do when you have symptoms of heart failure. Follow the plan that was created by you and your health care provider. Review your plan each time you visit your health care provider. Red zone These signs and symptoms mean you should get medical help right away:  You have trouble breathing when resting.  You have a dry cough that is getting worse.  You have swelling or pain in your legs or abdomen that is getting worse.  You  suddenly gain more than 2-3 lb (0.9-1.4 kg) in a day, or more than 5 lb (2.3 kg) in one week. This amount may be more or less depending on your condition.  You have trouble staying awake or you feel confused.  You have chest pain.  You do not have an appetite.  You pass out. If you experience any of these symptoms:  Call your local emergency services (911 in the [place] right away or seek help at the emergency department of the nearest hospital. Yellow zone These signs and symptoms mean your condition may be getting worse and you should make some changes:  You have trouble breathing when you are active or you need to sleep with extra pillows.  You have swelling in your legs or abdomen.  You gain 2-3 lb (0.9-1.4 kg) in one day, or 5 lb (2.3 kg) in one week. This amount may be more or less depending on your condition.  You get tired easily.  You have trouble sleeping.  You have a dry cough. If you experience any of these symptoms:  Contact your health care provider within the next day.  Your health care provider may adjust your medicines. Green zone These signs mean you are  doing well and can continue what you are doing:  You do not have shortness of breath.  You have very little swelling or no new swelling.  Your weight is stable (no gain or loss).  You have a normal activity level.  You do not have chest pain or any other new symptoms. Follow these instructions at home:  Take over-the-counter and prescription medicines only as told by your health care provider.  Weigh yourself daily. Your target weight is __________ lb (__________ kg). ? Call your health care provider if you gain more than __________ lb (__________ kg) in a day, or more than __________ lb (__________ kg) in one week.  Eat a heart-healthy diet. Work with a diet and nutrition specialist (dietitian) to create an eating plan that is best for you.  Keep all follow-up visits as told by your health care provider. This is important. Where to find more information  American Heart Association: www.heart.org Summary  Follow the action plan that was created by you and your health care provider.  Get help right away if you have any symptoms in the Red zone. This information is not intended to replace advice given to you by your health care provider. Make sure you discuss any questions you have with your health care provider. Document Revised: 04/14/2017 Document Reviewed: 06/11/2016 Elsevier Patient Education  2020 [place].   Heart Failure Eating Plan Heart failure, also called congestive heart failure, occurs when your heart does not pump blood well enough to meet your body's needs for oxygen-rich blood. Heart failure is a long-term (chronic) condition. Living with heart failure can be challenging. However, following your health care provider's instructions about a healthy lifestyle and working with a diet and nutrition specialist (dietitian) to choose the right foods may help to improve your symptoms. What are tips for following this plan? Reading food labels  Check food labels for  the amount of sodium per serving. Choose foods that have less than 140 mg (milligrams) of sodium in each serving.  Check food labels for the number of calories per serving. This is important if you need to limit your daily calorie intake to lose weight.  Check food labels for the serving size. If you eat more than one serving, you  will be eating more sodium and calories than what is listed on the label.  Look for foods that are labeled as "sodium-free," "very low sodium," or "low sodium." ? Foods labeled as "reduced sodium" or "lightly salted" may still have more sodium than what is recommended for you. Cooking  Avoid adding salt when cooking. Ask your health care provider or dietitian before using salt substitutes.  Season food with salt-free seasonings, spices, or herbs. Check the label of seasoning mixes to make sure they do not contain salt.  Cook with heart-healthy oils, such as olive, canola, soybean, or sunflower oil.  Do not fry foods. Cook foods using low-fat methods, such as baking, boiling, grilling, and broiling.  Limit unhealthy fats when cooking by: ? Removing the skin from poultry, such as chicken. ? Removing all visible fats from meats. ? Skimming the fat off from stews, soups, and gravies before serving them. Meal planning   Limit your intake of: ? Processed, canned, or pre-packaged foods. ? Foods that are high in trans fat, such as fried foods. ? Sweets, desserts, sugary drinks, and other foods with added sugar. ? Full-fat dairy products, such as whole milk.  Eat a balanced diet that includes: ? 4-5 servings of fruit each day and 4-5 servings of vegetables each day. At each meal, try to fill half of your plate with fruits and vegetables. ? Up to 6-8 servings of whole grains each day. ? Up to 2 servings of lean meat, poultry, or fish each day. One serving of meat is equal to 3 oz. This is about the same size as a deck of cards. ? 2 servings of low-fat dairy each  day. ? Heart-healthy fats. Healthy fats called omega-3 fatty acids are found in foods such as flaxseed and cold-water fish like sardines, salmon, and mackerel.  Aim to eat 25-35 g (grams) of fiber a day. Foods that are high in fiber include apples, broccoli, carrots, beans, peas, and whole grains.  Do not add salt or condiments that contain salt (such as soy sauce) to foods before eating.  When eating at a restaurant, ask that your food be prepared with less salt or no salt, if possible.  Try to eat 2 or more vegetarian meals each week.  Eat more home-cooked food and eat less restaurant, buffet, and fast food. General information  Do not eat more than 2,300 mg of salt (sodium) a day. The amount of sodium that is recommended for you may be lower, depending on your condition.  Maintain a healthy body weight as directed. Ask your health care provider what a healthy weight is for you. ? Check your weight every day. ? Work with your health care provider and dietitian to make a plan that is right for you to lose weight or maintain your current weight.  Limit how much fluid you drink. Ask your health care provider or dietitian how much fluid you can have each day.  Limit or avoid alcohol as told by your health care provider or dietitian. Recommended foods The items listed may not be a complete list. Talk with your dietitian about what dietary choices are best for you. Fruits All fresh, frozen, and canned fruits. Dried fruits, such as raisins, prunes, and cranberries. Vegetables All fresh vegetables. Vegetables that are frozen without sauce or added salt. Low-sodium or sodium-free canned vegetables. Grains Bread with less than 80 mg of sodium per slice. Whole-wheat pasta, quinoa, and brown rice. Oats and oatmeal. Barley. [place]. Grits and cream  of wheat. Whole-grain and whole-wheat cold cereal. Meats and other protein foods Lean cuts of meat. Skinless chicken and [place]. Fish with high  omega-3 fatty acids, such as salmon, sardines, and other cold-water fishes. Eggs. Dried beans, peas, and edamame. Unsalted nuts and nut butters. Dairy Low-fat or nonfat (skim) milk and dried milk. Rice milk, soy milk, and almond milk. Low-fat or nonfat yogurt. Small amounts of reduced-sodium block cheese. Low-sodium cottage cheese. Fats and oils Olive, canola, soybean, flaxseed, or sunflower oil. Avocado. Sweets and desserts Apple sauce. Granola bars. Sugar-free pudding and gelatin. Frozen fruit bars. Seasoning and other foods Fresh and dried herbs. Lemon or lime juice. Vinegar. Low-sodium ketchup. Salt-free marinades, salad dressings, sauces, and seasonings. The items listed above may not be a complete list of foods and beverages you can eat. Contact a dietitian for more information. Foods to avoid The items listed may not be a complete list. Talk with your dietitian about what dietary choices are best for you. Fruits Fruits that are dried with sodium-containing preservatives. Vegetables Canned vegetables. Frozen vegetables with sauce or seasonings. Creamed vegetables. [place] fries. Onion rings. Pickled vegetables and sauerkraut. Grains Bread with more than 80 mg of sodium per slice. Hot or cold cereal with more than 140 mg sodium per serving. Salted pretzels and crackers. Pre-packaged breadcrumbs. Bagels, croissants, and biscuits. Meats and other protein foods Ribs and chicken wings. Bacon, ham, pepperoni, bologna, salami, and packaged luncheon meats. Hot dogs, bratwurst, and sausage. Canned meat. Smoked meat and fish. Salted nuts and seeds. Dairy Whole milk, half-and-half, and cream. Buttermilk. Processed cheese, cheese spreads, and cheese curds. Regular cottage cheese. Feta cheese. Shredded cheese. String cheese. Fats and oils Butter, lard, shortening, ghee, and bacon fat. Canned and packaged gravies. Seasoning and other foods Onion salt, garlic salt, table salt, and sea salt. Marinades.  Regular salad dressings. Relishes, pickles, and olives. Meat flavorings and tenderizers, and bouillon cubes. Horseradish, ketchup, and mustard. Worcestershire sauce. Teriyaki sauce, soy sauce (including reduced sodium). Hot sauce and Tabasco sauce. Steak sauce, fish sauce, oyster sauce, and cocktail sauce. Taco seasonings. Barbecue sauce. Tartar sauce. The items listed above may not be a complete list of foods and beverages you should avoid. Contact a dietitian for more information. Summary  A heart failure eating plan includes changes that limit your intake of sodium and unhealthy fat, and it may help you lose weight or maintain a healthy weight. Your health care provider may also recommend limiting how much fluid you drink.  Most people with heart failure should eat no more than 2,300 mg of salt (sodium) a day. The amount of sodium that is recommended for you may be lower, depending on your condition.  Contact your health care provider or dietitian before making any major changes to your diet. This information is not intended to replace advice given to you by your health care provider. Make sure you discuss any questions you have with your health care provider. Document Revised: 06/28/2018 Document Reviewed: 09/16/2016 Elsevier Patient Education  [place].

## 2020-05-10 NOTE — Progress Notes (Signed)
Patient alert and oriented x 4. Resps even and unlabored, lung sounds clear throughout but diminished posteriorly. He remains on room air with oxygen saturation above 94%. Bowel sounds active all quads, abdomen soft, nontender, last BM reported this morning 12/26. Patient using urinal to aid in measuring output and has good output for stage 5 CKD; urine is clear and yellow. Patient wearing TED hose and edema to right leg has decreased to trace- 1+, left leg is 0. 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 Additionally, patient states he has had an irregular heartbeat for some time, and can often feel it become rapid but "bearing down" usually returns it to normal; he has not felt the ectopy/irregularity exhibited on ecg the last 24-48hrs and has remained asymptomatic. Patient has a cardiologist following him at the [place]; last appointment was 6 mos ago and he has another appointment in January of 2022. Patient stated he feels improved from admission and is ready to be discharged. Call light in reach, able and encouraged to make needs known.

## 2020-05-12 ENCOUNTER — Other Ambulatory Visit: Payer: Self-pay

## 2020-05-12 ENCOUNTER — Encounter (HOSPITAL_COMMUNITY)
Admission: RE | Admit: 2020-05-12 | Discharge: 2020-05-12 | Disposition: A | Discharge status: Home / Self Care | Payer: No Typology Code available for payment source | Source: Ambulatory Visit | Attending: Nephrology | Admitting: Nephrology

## 2020-05-12 ENCOUNTER — Encounter (HOSPITAL_COMMUNITY): Payer: Self-pay

## 2020-05-12 DIAGNOSIS — E1122 Type 2 diabetes mellitus with diabetic chronic kidney disease: Secondary | ICD-10-CM | POA: Diagnosis not present

## 2020-05-12 DIAGNOSIS — E876 Hypokalemia: Secondary | ICD-10-CM | POA: Diagnosis not present

## 2020-05-12 DIAGNOSIS — D631 Anemia in chronic kidney disease: Secondary | ICD-10-CM | POA: Diagnosis present

## 2020-05-12 DIAGNOSIS — N184 Chronic kidney disease, stage 4 (severe): Secondary | ICD-10-CM | POA: Diagnosis not present

## 2020-05-12 DIAGNOSIS — I5032 Chronic diastolic (congestive) heart failure: Secondary | ICD-10-CM | POA: Diagnosis not present

## 2020-05-12 DIAGNOSIS — N041 Nephrotic syndrome with focal and segmental glomerular lesions: Secondary | ICD-10-CM | POA: Diagnosis not present

## 2020-05-12 LAB — POCT HEMOGLOBIN-HEMACUE: Hemoglobin: 8.6 g/dL — ABNORMAL LOW (ref 13.0–17.0)

## 2020-05-12 MED ORDER — EPOETIN ALFA 20000 UNIT/ML IJ SOLN
20000.0000 [IU] | Freq: Once | INTRAMUSCULAR | Status: AC
  Administered 2020-05-12: 14:00:00 20000 [IU] via SUBCUTANEOUS
  Filled 2020-05-12: qty 1

## 2020-05-12 NOTE — Discharge Summary (Signed)
Physician Discharge Summary  HARLEY FITZWATER QJJ:[id number] DOB: Nov 25, 1945 DOA: 05/08/2020  PCP: [facility]  Admit date: 05/08/2020 Discharge date: 05/12/2020  Admitted From: home Disposition:  home   Recommendations for Outpatient Follow-up:  1. F/u BP closely  Home Health:  none  Discharge Condition:  stable   CODE STATUS:  Full code   Diet recommendation:  Heart healthy  Consultations:  none  .     Discharge Diagnoses:  Principal Problem:   Acute exacerbation of Diastolic CHF (congestive heart failure) (HCC) Active Problems:   Essential hypertension   S/P CABG x 2   (HFpEF) heart failure with preserved ejection fraction (HCC)   CKD (chronic kidney disease), stage V (HCC)   Anemia in chronic kidney disease   Coronary artery disease involving autologous artery coronary bypass graft   Symptomatic bradycardia with heart failure   COPD without exacerbation (HCC)---40 Pack Years   Acute respiratory failure with hypoxia (HCC)   Acute diastolic CHF (congestive heart failure) ([place])     Brief Summary: DOMINIE BENEDICK is a43 y.o.male74 y.o.malewith past medical history relevant for CAD(s/p prior CABG), COPD, HTN, BPHand CKD Vas well as chronic anemia of CKDwho presents to the ED with worsening dyspnea at rest and dyspnea on exertion for at least 3 days  Patient primary complaint is worsening dyspnea on exertion, with increasing lower extremity edema especially on the right. Heincreased his lasix from 40 mg bid to 60 mg in the morning and 40 in the evening, however,dyspnea on exertionpersisted. He also complained of dyspnea when laying flat.    In the ED he was noted to by hypoxic and bradycardic. Pulse ox was 81 % and HR dropped into low 30s. He was admitted to be treated for acute CHF and bradycardia. His Coreg was held and he was transferred from the [facility] ED to [facility] hospital as no cardiologist was present at [facility] during this Christmas weekend.   Hospital  Course:  Principal Problem:   Acute exacerbation of Diastolic CHF (congestive heart failure)    Acute respiratory failure with hypoxia  -Last echo on 1/21 showed a normal EF and grade 1 diastolic heart failure -Patient was started on IV diuretics and has diuresed a little over 1 L overnight however, does not appear to be back to his baseline just yet -resolved IV diuretics -2D echo is being repeated  Active Problems: Bradycardia - Carvedilol was held and heart rate is now in the 70s at rest -Resumed carvedilol at 3.125 mg twice daily (home dose of 6.25 mg twice daily)- HR is stable on this  Pedal edema - right leg > left leg - despite diuresis, right leg remains swollen - venous duplex neg for DVT today - recommended TEDs and elevation  Hypertension -Uncontrolled -Needs better control of BP to help prevent future congestive heart failure exacerbations -cont Coreg, Hydralazine, Imdur, Amlodipine and Lasix   COPD without exacerbation (HCC)---40 Pack Years -No COPD exacerbation noted  CAD status post CABG -Status post left heart cath in 2015 with stenting -Continue Lipitor Plavix    CKD (chronic kidney disease), stage V  -The patient has a right arm AV fistula-has not yet required dialysis -Of note he was last admitted on 02/11/2020 for AKI    Anemia in chronic kidney disease -Hb appears stable-follow  BPH -Continue home meds       Discharge Exam: Vitals:   05/10/20 0800 05/10/20 1115  BP:  133/62  Pulse: 79 65  Resp:  18  Temp:  97.6 F (36.4 C)  SpO2:  97%   Vitals:   05/09/20 1914 05/10/20 0356 05/10/20 0800 05/10/20 1115  BP: (!) 135/51 (!) 141/53  133/62  Pulse: (!) 40 (!) 40 79 65  Resp: 20 19  18   Temp: 98 F (36.7 C) 98.2 F (36.8 C)  97.6 F (36.4 C)  TempSrc: Oral Oral  Oral  SpO2: 97% 99%  97%  Weight:  86.4 kg    Height:        General: Pt is alert, awake, not in acute distress Cardiovascular: RRR, S1/S2 +, no rubs, no  gallops Respiratory: CTA bilaterally, no wheezing, no rhonchi Abdominal: Soft, NT, ND, bowel sounds + Extremities: no edema, no cyanosis   Discharge Instructions  Discharge Instructions    Diet - low sodium heart healthy   Complete by: As directed    Strict fluids to < 1.5 L a day   Increase activity slowly   Complete by: As directed      Allergies as of 05/10/2020      Reactions   Cardizem Cd [diltiazem Hcl Er Beads] Palpitations   "Makes heart skip"      Medication List    TAKE these medications   acetaminophen 325 MG tablet Commonly known as: TYLENOL Take 2 tablets (650 mg total) by mouth every 6 (six) hours as needed for mild pain.   allopurinol 100 MG tablet Commonly known as: ZYLOPRIM Take 100 mg by mouth daily.   amLODipine 10 MG tablet Commonly known as: NORVASC Take 1 tablet (10 mg total) by mouth daily.   atorvastatin 80 MG tablet Commonly known as: LIPITOR Take 80 mg by mouth daily.   carvedilol 3.125 MG tablet Commonly known as: Coreg Take 1 tablet (3.125 mg total) by mouth 2 (two) times daily. What changed:   medication strength  how much to take   clopidogrel 75 MG tablet Commonly known as: PLAVIX Take 75 mg by mouth daily.   epoetin alfa 20000 UNIT/ML injection Commonly known as: EPOGEN Inject 20,000 Units into the skin every Monday.   famotidine 40 MG tablet Commonly known as: PEPCID Take 40 mg by mouth daily.   finasteride 5 MG tablet Commonly known as: PROSCAR Take 5 mg by mouth daily.   folic acid 1 MG tablet Commonly known as: FOLVITE Take 1 mg by mouth daily.   furosemide 40 MG tablet Commonly known as: LASIX Take 1-1.5 tablets (40-60 mg total) by mouth See admin instructions. Take 1.5 tablets (60 mg totally) by mouth in the morning; take 1 tablet (40 mg totally) by mouth in the evening   hydrALAZINE 100 MG tablet Commonly known as: APRESOLINE Take 1 tablet (100 mg total) by mouth 3 (three) times daily.   isosorbide  mononitrate 120 MG 24 hr tablet Commonly known as: IMDUR Take 1 tablet (120 mg total) by mouth daily. What changed: how much to take   Klor-Con M20 20 MEQ tablet Generic drug: potassium chloride SA Take 20 mEq by mouth daily.   levothyroxine 50 MCG tablet Commonly known as: SYNTHROID Take 50 mcg by mouth daily before breakfast.   nitroGLYCERIN 0.4 MG SL tablet Commonly known as: NITROSTAT Place 0.4 mg under the tongue every 5 (five) minutes as needed for chest pain (max 3 doses).   omeprazole 20 MG capsule Commonly known as: PRILOSEC Take 20 mg by mouth 2 (two) times daily.   oxyCODONE-acetaminophen 5-325 MG tablet Commonly known as: Percocet Take 1 tablet by mouth every  6 (six) hours as needed for severe pain.   RA Fish Oil 1000 MG Caps Take 2,000 mg by mouth 2 (two) times daily.   tamsulosin 0.4 MG Caps capsule Commonly known as: FLOMAX Take 0.4 mg by mouth daily.   Vitamin D (Cholecalciferol) 25 MCG (1000 UT) Caps Take 1,000 Units by mouth daily.       Allergies  Allergen Reactions  . Cardizem Cd [Diltiazem Hcl Er Beads] Palpitations    "Makes heart skip"      DG Chest Port 1 View  Result Date: 05/08/2020 CLINICAL DATA:  Shortness of breath and bradycardia for 3 days, pending COVID test EXAM: PORTABLE CHEST 1 VIEW COMPARISON:  Portable exam 1319 hours compared to 02/07/2020 FINDINGS: Upper normal size of cardiac silhouette post median sternotomy. Atherosclerotic calcification aorta. Mediastinal contours and pulmonary vascularity normal. Peribronchial thickening with accentuated perihilar markings question infiltrate or edema. No pleural effusion or pneumothorax. No acute osseous findings. IMPRESSION: Peribronchial thickening with BILATERAL interstitial infiltrates question atypical infection versus edema. Electronically Signed   By: Lavonia Dana M.D.   On: 05/08/2020 13:26   ECHOCARDIOGRAM COMPLETE  Result Date: 05/09/2020    ECHOCARDIOGRAM REPORT   Patient  Name:   VEDH PTACEK Date of Exam: 05/09/2020 Medical Rec #:  [id number]      Height:       69.0 in Accession #:    [id number]     Weight:       193.2 lb Date of Birth:  09/03/1945      BSA:          2.036 m Patient Age:    57 years       BP:           159/54 mmHg Patient Gender: M              HR:           85 bpm. Exam Location:  Inpatient Procedure: 2D Echo, Cardiac Doppler and Color Doppler Indications:    Dyspnea  History:        Patient has prior history of Echocardiogram examinations, most                 recent 05/17/2019. CHF, Previous Myocardial Infarction, Prior                 CABG, COPD, Aortic Valve Disease; Risk Factors:Hypertension,                 Dyslipidemia and Former Smoker. H/O stroke.  Sonographer:    Clayton Lefort RDCS (AE) Referring Phys: [place]  1. Left ventricular ejection fraction, by estimation, is 55 to 60%. The left ventricle has normal function. The left ventricle has no regional wall motion abnormalities. There is severe concentric left ventricular hypertrophy. Left ventricular diastolic  parameters are consistent with Grade I diastolic dysfunction (impaired relaxation). Elevated left atrial pressure.  2. Right ventricular systolic function is normal. The right ventricular size is normal. There is moderately elevated pulmonary artery systolic pressure. The estimated right ventricular systolic pressure is 17.6 mmHg.  3. Left atrial size was moderately dilated.  4. Right atrial size was mildly dilated.  5. The mitral valve is normal in structure. Mild to moderate mitral valve regurgitation. No evidence of mitral stenosis.  6. Tricuspid valve regurgitation is mild to moderate.  7. The aortic valve is normal in structure. There is severe calcifcation of the aortic valve. There is severe thickening of the aortic  valve. Aortic valve regurgitation is mild. Moderate aortic valve stenosis. Aortic valve mean gradient measures 18.0 mmHg.  8. The inferior vena cava is  dilated in size with <50% respiratory variability, suggesting right atrial pressure of 15 mmHg. FINDINGS  Left Ventricle: Left ventricular ejection fraction, by estimation, is 55 to 60%. The left ventricle has normal function. The left ventricle has no regional wall motion abnormalities. The left ventricular internal cavity size was normal in size. There is  severe concentric left ventricular hypertrophy. Left ventricular diastolic parameters are consistent with Grade I diastolic dysfunction (impaired relaxation). Elevated left atrial pressure. Right Ventricle: The right ventricular size is normal. No increase in right ventricular wall thickness. Right ventricular systolic function is normal. There is moderately elevated pulmonary artery systolic pressure. The tricuspid regurgitant velocity is 2.86 m/s, and with an assumed right atrial pressure of 15 mmHg, the estimated right ventricular systolic pressure is 24.2 mmHg. Left Atrium: Left atrial size was moderately dilated. Right Atrium: Right atrial size was mildly dilated. Pericardium: There is no evidence of pericardial effusion. Mitral Valve: The mitral valve is normal in structure. Mild to moderate mitral valve regurgitation. No evidence of mitral valve stenosis. MV peak gradient, 9.5 mmHg. The mean mitral valve gradient is 4.0 mmHg. Tricuspid Valve: The tricuspid valve is normal in structure. Tricuspid valve regurgitation is mild to moderate. No evidence of tricuspid stenosis. Aortic Valve: The aortic valve is normal in structure. There is severe calcifcation of the aortic valve. There is severe thickening of the aortic valve. Aortic valve regurgitation is mild. Aortic regurgitation PHT measures 465 msec. Moderate aortic stenosis is present. Aortic valve mean gradient measures 18.0 mmHg. Aortic valve peak gradient measures 31.4 mmHg. Aortic valve area, by VTI measures 1.66 cm. Pulmonic Valve: The pulmonic valve was normal in structure. Pulmonic valve  regurgitation is not visualized. No evidence of pulmonic stenosis. Aorta: The aortic root is normal in size and structure. Venous: The inferior vena cava is dilated in size with less than 50% respiratory variability, suggesting right atrial pressure of 15 mmHg. IAS/Shunts: No atrial level shunt detected by color flow Doppler.  LEFT VENTRICLE PLAX 2D LVIDd:         5.10 cm  Diastology LVIDs:         3.70 cm  LV e' medial:    5.66 cm/s LV PW:         1.50 cm  LV E/e' medial:  25.4 LV IVS:        1.70 cm  LV e' lateral:   7.07 cm/s LVOT diam:     2.20 cm  LV E/e' lateral: 20.4 LV SV:         118 LV SV Index:   58 LVOT Area:     3.80 cm  RIGHT VENTRICLE            IVC RV Basal diam:  4.20 cm    IVC diam: 2.40 cm RV Mid diam:    2.90 cm RV S prime:     8.38 cm/s TAPSE (M-mode): 2.5 cm LEFT ATRIUM              Index       RIGHT ATRIUM           Index LA diam:        4.40 cm  2.16 cm/m  RA Area:     18.40 cm LA Vol (A2C):   111.0 ml 54.52 ml/m RA Volume:   52.40 ml  25.74  ml/m LA Vol (A4C):   90.6 ml  44.50 ml/m LA Biplane Vol: 101.0 ml 49.61 ml/m  AORTIC VALVE AV Area (Vmax):    1.63 cm AV Area (Vmean):   1.59 cm AV Area (VTI):     1.66 cm AV Vmax:           280.00 cm/s AV Vmean:          199.000 cm/s AV VTI:            0.712 m AV Peak Grad:      31.4 mmHg AV Mean Grad:      18.0 mmHg LVOT Vmax:         120.00 cm/s LVOT Vmean:        83.200 cm/s LVOT VTI:          0.310 m LVOT/AV VTI ratio: 0.44 AI PHT:            465 msec  AORTA Ao Root diam: 3.50 cm Ao Asc diam:  3.30 cm MITRAL VALVE                TRICUSPID VALVE MV Area (PHT): 2.66 cm     TR Peak grad:   32.7 mmHg MV Peak grad:  9.5 mmHg     TR Vmax:        286.00 cm/s MV Mean grad:  4.0 mmHg MV Vmax:       1.54 m/s     SHUNTS MV Vmean:      84.4 cm/s    Systemic VTI:  0.31 m MV Decel Time: 285 msec     Systemic Diam: 2.20 cm MR Peak grad: 163.3 mmHg MR Mean grad: 116.0 mmHg MR Vmax:      639.00 cm/s MR Vmean:     519.0 cm/s MV E velocity: 144.00 cm/s MV A  velocity: 150.00 cm/s MV E/A ratio:  0.96 Ena Dawley MD Electronically signed by Ena Dawley MD Signature Date/Time: 05/09/2020/12:46:23 PM    Final    VAS [place] LOWER EXTREMITY VENOUS (DVT)  Result Date: 05/11/2020  Lower Venous DVT Study Indications: Edema.  Comparison Study: No prior studies. Performing Technologist: Darlin Coco, RDMS  Examination Guidelines: A complete evaluation includes B-mode imaging, spectral Doppler, color Doppler, and power Doppler as needed of all accessible portions of each vessel. Bilateral testing is considered an integral part of a complete examination. Limited examinations for reoccurring indications may be performed as noted. The reflux portion of the exam is performed with the patient in reverse Trendelenburg.  +---------+---------------+---------+-----------+----------+--------------+ RIGHT    CompressibilityPhasicitySpontaneityPropertiesThrombus Aging +---------+---------------+---------+-----------+----------+--------------+ CFV      Full           Yes      Yes                                 +---------+---------------+---------+-----------+----------+--------------+ SFJ      Full                                                        +---------+---------------+---------+-----------+----------+--------------+ FV Prox  Full                                                        +---------+---------------+---------+-----------+----------+--------------+  FV Mid   Full                                                        +---------+---------------+---------+-----------+----------+--------------+ FV DistalFull                                                        +---------+---------------+---------+-----------+----------+--------------+ PFV      Full                                                        +---------+---------------+---------+-----------+----------+--------------+ POP      Full           Yes      Yes                                  +---------+---------------+---------+-----------+----------+--------------+ PTV      Full                                                        +---------+---------------+---------+-----------+----------+--------------+ PERO     Full                                                        +---------+---------------+---------+-----------+----------+--------------+   +----+---------------+---------+-----------+----------+--------------+ LEFTCompressibilityPhasicitySpontaneityPropertiesThrombus Aging +----+---------------+---------+-----------+----------+--------------+ CFV Full           Yes      Yes                                 +----+---------------+---------+-----------+----------+--------------+     Summary: RIGHT: - There is no evidence of deep vein thrombosis in the lower extremity.  - No cystic structure found in the popliteal fossa.  LEFT: - No evidence of common femoral vein obstruction.  *See table(s) above for measurements and observations. Electronically signed by Ruta Hinds MD on 05/11/2020 at 5:25:12 PM.    Final       The results of significant diagnostics from this hospitalization (including imaging, microbiology, ancillary and laboratory) are listed below for reference.     Microbiology: Recent Results (from the past 240 hour(s))  Resp Panel by RT-PCR (Flu A&B, Covid) Nasopharyngeal Swab     Status: None   Collection Time: 05/08/20 12:24 PM   Specimen: Nasopharyngeal Swab; Nasopharyngeal(NP) swabs in vial transport medium  Result Value Ref Range Status   SARS Coronavirus 2 by RT PCR NEGATIVE NEGATIVE Final    Comment: (NOTE) SARS-CoV-2 target nucleic acids are NOT DETECTED.  The SARS-CoV-2 RNA is generally detectable in upper respiratory specimens during the acute phase  of infection. The lowest concentration of SARS-CoV-2 viral copies this assay can detect is 138 copies/mL. A negative result does not preclude  SARS-Cov-2 infection and should not be used as the sole basis for treatment or other patient management decisions. A negative result may occur with  improper specimen collection/handling, submission of specimen other than nasopharyngeal swab, presence of viral mutation(s) within the areas targeted by this assay, and inadequate number of viral copies(<138 copies/mL). A negative result must be combined with clinical observations, patient history, and epidemiological information. The expected result is Negative.  Fact Sheet for Patients:  EntrepreneurPulse.com.au  Fact Sheet for Healthcare Providers:  IncredibleEmployment.be  This test is no t yet approved or cleared by the [place] FDA and  has been authorized for detection and/or diagnosis of SARS-CoV-2 by FDA under an Emergency Use Authorization (EUA). This EUA will remain  in effect (meaning this test can be used) for the duration of the COVID-19 declaration under Section 564(b)(1) of the Act, 21 U.S.C.section 360bbb-3(b)(1), unless the authorization is terminated  or revoked sooner.       Influenza A by PCR NEGATIVE NEGATIVE Final   Influenza B by PCR NEGATIVE NEGATIVE Final    Comment: (NOTE) The Xpert Xpress SARS-CoV-2/FLU/RSV plus assay is intended as an aid in the diagnosis of influenza from Nasopharyngeal swab specimens and should not be used as a sole basis for treatment. Nasal washings and aspirates are unacceptable for Xpert Xpress SARS-CoV-2/FLU/RSV testing.  Fact Sheet for Patients: EntrepreneurPulse.com.au  Fact Sheet for Healthcare Providers: IncredibleEmployment.be  This test is not yet approved or cleared by the [place] FDA and has been authorized for detection and/or diagnosis of SARS-CoV-2 by FDA under an Emergency Use Authorization (EUA). This EUA will remain in effect (meaning this test can be used) for the duration of  the COVID-19 declaration under Section 564(b)(1) of the Act, 21 U.S.C. section 360bbb-3(b)(1), unless the authorization is terminated or revoked.  Performed at [facility], [address] [place], [place] [address]      Labs: BNP (last 3 results) Recent Labs    05/16/19 1010 05/08/20 1226  BNP 666.0* 0,263.7*   Basic Metabolic Panel: Recent Labs  Lab 05/08/20 1223 05/09/20 0221 05/09/20 0833 05/10/20 0331  NA 138 138 135 136  K 3.8 4.4 4.0 4.1  CL 100 100 99 99  CO2 26 25 24 26   GLUCOSE 116* 86 176* 95  BUN 67* 70* 67* 75*  CREATININE 4.33* 4.32* 4.29* 4.45*  CALCIUM 8.8* 9.1 8.8* 8.7*  MG 1.9  --  1.7  --    Liver Function Tests: No results for input(s): AST, ALT, ALKPHOS, BILITOT, PROT, ALBUMIN in the last 168 hours. No results for input(s): LIPASE, AMYLASE in the last 168 hours. No results for input(s): AMMONIA in the last 168 hours. CBC: Recent Labs  Lab 05/08/20 1223 05/09/20 0221 05/12/20 1341  WBC 5.6 7.4  --   HGB 9.1* 8.9* 8.6*  HCT 29.9* 28.1*  --   MCV 96.5 91.5  --   PLT 183 183  --    Cardiac Enzymes: No results for input(s): CKTOTAL, CKMB, CKMBINDEX, TROPONINI in the last 168 hours. BNP: Invalid input(s): POCBNP CBG: No results for input(s): GLUCAP in the last 168 hours. D-Dimer No results for input(s): DDIMER in the last 72 hours. Hgb A1c No results for input(s): HGBA1C in the last 72 hours. Lipid Profile No results for input(s): CHOL, HDL, LDLCALC, TRIG, CHOLHDL, LDLDIRECT in the last 72 hours.  Thyroid function studies No results for input(s): TSH, T4TOTAL, T3FREE, THYROIDAB in the last 72 hours.  Invalid input(s): FREET3 Anemia work up No results for input(s): VITAMINB12, FOLATE, FERRITIN, TIBC, IRON, RETICCTPCT in the last 72 hours. Urinalysis    Component Value Date/Time   COLORURINE YELLOW 02/09/2020 [place] 02/09/2020 0958   LABSPEC 1.012 02/09/2020 0958   PHURINE 5.0 02/09/2020 0958   GLUCOSEU NEGATIVE  02/09/2020 0958   HGBUR NEGATIVE 02/09/2020 0958   BILIRUBINUR NEGATIVE 02/09/2020 0958   KETONESUR NEGATIVE 02/09/2020 0958   PROTEINUR 100 (A) 02/09/2020 0958   NITRITE NEGATIVE 02/09/2020 0958   LEUKOCYTESUR NEGATIVE 02/09/2020 0958   Sepsis Labs Invalid input(s): PROCALCITONIN,  WBC,  LACTICIDVEN Microbiology Recent Results (from the past 240 hour(s))  Resp Panel by RT-PCR (Flu A&B, Covid) Nasopharyngeal Swab     Status: None   Collection Time: 05/08/20 12:24 PM   Specimen: Nasopharyngeal Swab; Nasopharyngeal(NP) swabs in vial transport medium  Result Value Ref Range Status   SARS Coronavirus 2 by RT PCR NEGATIVE NEGATIVE Final    Comment: (NOTE) SARS-CoV-2 target nucleic acids are NOT DETECTED.  The SARS-CoV-2 RNA is generally detectable in upper respiratory specimens during the acute phase of infection. The lowest concentration of SARS-CoV-2 viral copies this assay can detect is 138 copies/mL. A negative result does not preclude SARS-Cov-2 infection and should not be used as the sole basis for treatment or other patient management decisions. A negative result may occur with  improper specimen collection/handling, submission of specimen other than nasopharyngeal swab, presence of viral mutation(s) within the areas targeted by this assay, and inadequate number of viral copies(<138 copies/mL). A negative result must be combined with clinical observations, patient history, and epidemiological information. The expected result is Negative.  Fact Sheet for Patients:  EntrepreneurPulse.com.au  Fact Sheet for Healthcare Providers:  IncredibleEmployment.be  This test is no t yet approved or cleared by the [place] FDA and  has been authorized for detection and/or diagnosis of SARS-CoV-2 by FDA under an Emergency Use Authorization (EUA). This EUA will remain  in effect (meaning this test can be used) for the duration of the COVID-19  declaration under Section 564(b)(1) of the Act, 21 U.S.C.section 360bbb-3(b)(1), unless the authorization is terminated  or revoked sooner.       Influenza A by PCR NEGATIVE NEGATIVE Final   Influenza B by PCR NEGATIVE NEGATIVE Final    Comment: (NOTE) The Xpert Xpress SARS-CoV-2/FLU/RSV plus assay is intended as an aid in the diagnosis of influenza from Nasopharyngeal swab specimens and should not be used as a sole basis for treatment. Nasal washings and aspirates are unacceptable for Xpert Xpress SARS-CoV-2/FLU/RSV testing.  Fact Sheet for Patients: EntrepreneurPulse.com.au  Fact Sheet for Healthcare Providers: IncredibleEmployment.be  This test is not yet approved or cleared by the [place] FDA and has been authorized for detection and/or diagnosis of SARS-CoV-2 by FDA under an Emergency Use Authorization (EUA). This EUA will remain in effect (meaning this test can be used) for the duration of the COVID-19 declaration under Section 564(b)(1) of the Act, 21 U.S.C. section 360bbb-3(b)(1), unless the authorization is terminated or revoked.  Performed at [facility], [address], [place], [place] [address]      Time coordinating discharge in minutes: 65  SIGNED:   Debbe Odea, MD  [facility] 05/12/2020, 3:53 PM

## 2020-05-19 ENCOUNTER — Other Ambulatory Visit: Payer: Self-pay

## 2020-05-19 ENCOUNTER — Encounter (HOSPITAL_COMMUNITY)
Admission: RE | Admit: 2020-05-19 | Discharge: 2020-05-19 | Disposition: A | Discharge status: Home / Self Care | Payer: No Typology Code available for payment source | Source: Ambulatory Visit | Attending: Nephrology | Admitting: Nephrology

## 2020-05-19 ENCOUNTER — Encounter (HOSPITAL_COMMUNITY): Payer: Self-pay

## 2020-05-19 DIAGNOSIS — N184 Chronic kidney disease, stage 4 (severe): Secondary | ICD-10-CM | POA: Insufficient documentation

## 2020-05-19 DIAGNOSIS — D631 Anemia in chronic kidney disease: Secondary | ICD-10-CM | POA: Diagnosis not present

## 2020-05-19 LAB — CBC
HCT: 28.4 % — ABNORMAL LOW (ref 39.0–52.0)
Hemoglobin: 8.9 g/dL — ABNORMAL LOW (ref 13.0–17.0)
MCH: 29.1 pg (ref 26.0–34.0)
MCHC: 31.3 g/dL (ref 30.0–36.0)
MCV: 92.8 fL (ref 80.0–100.0)
Platelets: 185 10*3/uL (ref 150–400)
RBC: 3.06 MIL/uL — ABNORMAL LOW (ref 4.22–5.81)
RDW: 15.9 % — ABNORMAL HIGH (ref 11.5–15.5)
WBC: 4.9 10*3/uL (ref 4.0–10.5)
nRBC: 0 % (ref 0.0–0.2)

## 2020-05-19 LAB — RENAL FUNCTION PANEL
Albumin: 3.3 g/dL — ABNORMAL LOW (ref 3.5–5.0)
Anion gap: 11 (ref 5–15)
BUN: 83 mg/dL — ABNORMAL HIGH (ref 8–23)
CO2: 25 mmol/L (ref 22–32)
Calcium: 8.9 mg/dL (ref 8.9–10.3)
Chloride: 104 mmol/L (ref 98–111)
Creatinine, Ser: 3.83 mg/dL — ABNORMAL HIGH (ref 0.61–1.24)
GFR, Estimated: 16 mL/min — ABNORMAL LOW (ref >60)
Glucose, Bld: 91 mg/dL (ref 70–99)
Phosphorus: 5 mg/dL — ABNORMAL HIGH (ref 2.5–4.6)
Potassium: 4.2 mmol/L (ref 3.5–5.1)
Sodium: 140 mmol/L (ref 135–145)

## 2020-05-19 LAB — PROTEIN / CREATININE RATIO, URINE
Creatinine, Urine: 70.54 mg/dL
Protein Creatinine Ratio: 3.74 mg/mg{Cre} — ABNORMAL HIGH (ref 0.00–0.15)
Total Protein, Urine: 264 mg/dL

## 2020-05-19 LAB — POCT HEMOGLOBIN-HEMACUE: Hemoglobin: 8.6 g/dL — ABNORMAL LOW (ref 13.0–17.0)

## 2020-05-19 LAB — VITAMIN D 25 HYDROXY (VIT D DEFICIENCY, FRACTURES): Vit D, 25-Hydroxy: 54.33 ng/mL (ref 30–100)

## 2020-05-19 LAB — IRON AND TIBC
Iron: 25 ug/dL — ABNORMAL LOW (ref 45–182)
Saturation Ratios: 11 % — ABNORMAL LOW (ref 17.9–39.5)
TIBC: 217 ug/dL — ABNORMAL LOW (ref 250–450)
UIBC: 192 ug/dL

## 2020-05-19 LAB — FERRITIN: Ferritin: 350 ng/mL — ABNORMAL HIGH (ref 24–336)

## 2020-05-19 MED ORDER — EPOETIN ALFA 20000 UNIT/ML IJ SOLN
20000.0000 [IU] | Freq: Once | INTRAMUSCULAR | Status: AC
  Administered 2020-05-19: 20000 [IU] via SUBCUTANEOUS
  Filled 2020-05-19: qty 1

## 2020-05-20 LAB — PARATHYROID HORMONE, INTACT (NO CA): PTH: 62 pg/mL (ref 15–65)

## 2020-06-01 ENCOUNTER — Encounter (HOSPITAL_COMMUNITY)
Admission: RE | Admit: 2020-06-01 | Discharge: 2020-06-01 | Disposition: A | Discharge status: Home / Self Care | Payer: No Typology Code available for payment source | Source: Ambulatory Visit | Attending: Nephrology | Admitting: Nephrology

## 2020-06-01 ENCOUNTER — Encounter (HOSPITAL_COMMUNITY): Payer: Self-pay

## 2020-06-01 ENCOUNTER — Other Ambulatory Visit: Payer: Self-pay

## 2020-06-01 DIAGNOSIS — N184 Chronic kidney disease, stage 4 (severe): Secondary | ICD-10-CM | POA: Diagnosis not present

## 2020-06-01 LAB — POCT HEMOGLOBIN-HEMACUE: Hemoglobin: 8.1 g/dL — ABNORMAL LOW (ref 13.0–17.0)

## 2020-06-01 MED ORDER — EPOETIN ALFA 20000 UNIT/ML IJ SOLN
20000.0000 [IU] | INTRAMUSCULAR | Status: DC
  Administered 2020-06-01: 20000 [IU] via SUBCUTANEOUS
  Filled 2020-06-01: qty 1

## 2020-06-08 ENCOUNTER — Other Ambulatory Visit: Payer: Self-pay

## 2020-06-08 ENCOUNTER — Encounter (HOSPITAL_COMMUNITY)
Admission: RE | Admit: 2020-06-08 | Discharge: 2020-06-08 | Disposition: A | Discharge status: Home / Self Care | Payer: No Typology Code available for payment source | Source: Ambulatory Visit | Attending: Nephrology | Admitting: Nephrology

## 2020-06-08 ENCOUNTER — Encounter (HOSPITAL_COMMUNITY): Payer: Self-pay

## 2020-06-08 DIAGNOSIS — N184 Chronic kidney disease, stage 4 (severe): Secondary | ICD-10-CM | POA: Diagnosis not present

## 2020-06-08 LAB — POCT HEMOGLOBIN-HEMACUE: Hemoglobin: 8 g/dL — ABNORMAL LOW (ref 13.0–17.0)

## 2020-06-08 MED ORDER — EPOETIN ALFA 20000 UNIT/ML IJ SOLN
20000.0000 [IU] | Freq: Once | INTRAMUSCULAR | Status: AC
  Administered 2020-06-08: 20000 [IU] via SUBCUTANEOUS
  Filled 2020-06-08: qty 1

## 2020-06-15 ENCOUNTER — Encounter (HOSPITAL_COMMUNITY)
Admission: RE | Admit: 2020-06-15 | Discharge: 2020-06-15 | Disposition: A | Discharge status: Home / Self Care | Payer: No Typology Code available for payment source | Source: Ambulatory Visit | Attending: Nephrology | Admitting: Nephrology

## 2020-06-15 ENCOUNTER — Other Ambulatory Visit: Payer: Self-pay

## 2020-06-15 ENCOUNTER — Encounter (HOSPITAL_COMMUNITY): Payer: Self-pay

## 2020-06-15 ENCOUNTER — Other Ambulatory Visit (HOSPITAL_COMMUNITY): Payer: No Typology Code available for payment source

## 2020-06-15 DIAGNOSIS — D631 Anemia in chronic kidney disease: Secondary | ICD-10-CM | POA: Insufficient documentation

## 2020-06-15 DIAGNOSIS — N184 Chronic kidney disease, stage 4 (severe): Secondary | ICD-10-CM | POA: Diagnosis present

## 2020-06-15 LAB — POCT HEMOGLOBIN-HEMACUE: Hemoglobin: 8.4 g/dL — ABNORMAL LOW (ref 13.0–17.0)

## 2020-06-15 MED ORDER — SODIUM CHLORIDE 0.9 % IV SOLN
510.0000 mg | Freq: Once | INTRAVENOUS | Status: AC
  Administered 2020-06-15: 510 mg via INTRAVENOUS
  Filled 2020-06-15: qty 510

## 2020-06-15 MED ORDER — SODIUM CHLORIDE 0.9 % IV SOLN: Freq: Once | INTRAVENOUS | Status: AC

## 2020-06-15 MED ORDER — EPOETIN ALFA 20000 UNIT/ML IJ SOLN
INTRAMUSCULAR | Status: AC
  Filled 2020-06-15: qty 1

## 2020-06-15 MED ORDER — EPOETIN ALFA 20000 UNIT/ML IJ SOLN
20000.0000 [IU] | Freq: Once | INTRAMUSCULAR | Status: AC
  Administered 2020-06-15: 20000 [IU] via SUBCUTANEOUS

## 2020-06-22 ENCOUNTER — Encounter (HOSPITAL_COMMUNITY): Admission: RE | Admit: 2020-06-22 | Payer: No Typology Code available for payment source | Source: Ambulatory Visit

## 2020-06-22 ENCOUNTER — Encounter (HOSPITAL_COMMUNITY): Payer: No Typology Code available for payment source

## 2020-06-23 ENCOUNTER — Other Ambulatory Visit: Payer: Self-pay

## 2020-06-23 ENCOUNTER — Encounter (HOSPITAL_COMMUNITY)
Admission: RE | Admit: 2020-06-23 | Discharge: 2020-06-23 | Disposition: A | Discharge status: Home / Self Care | Payer: No Typology Code available for payment source | Source: Ambulatory Visit | Attending: Nephrology | Admitting: Nephrology

## 2020-06-23 ENCOUNTER — Encounter (HOSPITAL_COMMUNITY): Payer: Self-pay

## 2020-06-23 DIAGNOSIS — N184 Chronic kidney disease, stage 4 (severe): Secondary | ICD-10-CM | POA: Insufficient documentation

## 2020-06-23 DIAGNOSIS — D631 Anemia in chronic kidney disease: Secondary | ICD-10-CM | POA: Insufficient documentation

## 2020-06-23 LAB — POCT HEMOGLOBIN-HEMACUE: Hemoglobin: 9 g/dL — ABNORMAL LOW (ref 13.0–17.0)

## 2020-06-23 MED ORDER — EPOETIN ALFA 20000 UNIT/ML IJ SOLN
INTRAMUSCULAR | Status: AC
  Filled 2020-06-23: qty 1

## 2020-06-23 MED ORDER — SODIUM CHLORIDE 0.9 % IV SOLN
510.0000 mg | Freq: Once | INTRAVENOUS | Status: AC
  Administered 2020-06-23: 510 mg via INTRAVENOUS
  Filled 2020-06-23: qty 17

## 2020-06-23 MED ORDER — EPOETIN ALFA 20000 UNIT/ML IJ SOLN: 20000.0000 [IU] | Freq: Once | INTRAMUSCULAR | Status: DC

## 2020-06-23 MED ORDER — EPOETIN ALFA 20000 UNIT/ML IJ SOLN
20000.0000 [IU] | Freq: Once | INTRAMUSCULAR | Status: DC
  Administered 2020-06-23: 20000 [IU] via SUBCUTANEOUS

## 2020-06-23 MED ORDER — SODIUM CHLORIDE 0.9 % IV SOLN: Freq: Once | INTRAVENOUS | Status: AC

## 2020-06-30 ENCOUNTER — Encounter (HOSPITAL_COMMUNITY)
Admission: RE | Admit: 2020-06-30 | Discharge: 2020-06-30 | Disposition: A | Discharge status: Home / Self Care | Payer: No Typology Code available for payment source | Source: Ambulatory Visit | Attending: Nephrology | Admitting: Nephrology

## 2020-06-30 ENCOUNTER — Other Ambulatory Visit: Payer: Self-pay

## 2020-06-30 DIAGNOSIS — N184 Chronic kidney disease, stage 4 (severe): Secondary | ICD-10-CM | POA: Diagnosis not present

## 2020-06-30 LAB — POCT HEMOGLOBIN-HEMACUE: Hemoglobin: 8.5 g/dL — ABNORMAL LOW (ref 13.0–17.0)

## 2020-06-30 MED ORDER — EPOETIN ALFA 20000 UNIT/ML IJ SOLN
20000.0000 [IU] | Freq: Once | INTRAMUSCULAR | Status: AC
  Administered 2020-06-30: 20000 [IU] via SUBCUTANEOUS

## 2020-06-30 MED ORDER — EPOETIN ALFA 20000 UNIT/ML IJ SOLN
INTRAMUSCULAR | Status: AC
  Filled 2020-06-30: qty 1

## 2020-07-08 ENCOUNTER — Encounter (HOSPITAL_COMMUNITY)
Admission: RE | Admit: 2020-07-08 | Discharge: 2020-07-08 | Disposition: A | Discharge status: Home / Self Care | Payer: No Typology Code available for payment source | Source: Ambulatory Visit | Attending: Nephrology | Admitting: Nephrology

## 2020-07-16 ENCOUNTER — Encounter (HOSPITAL_COMMUNITY): Payer: Self-pay

## 2020-07-16 ENCOUNTER — Other Ambulatory Visit: Payer: Self-pay

## 2020-07-16 ENCOUNTER — Encounter (HOSPITAL_COMMUNITY)
Admission: RE | Admit: 2020-07-16 | Discharge: 2020-07-16 | Disposition: A | Discharge status: Home / Self Care | Payer: No Typology Code available for payment source | Source: Ambulatory Visit | Attending: Nephrology | Admitting: Nephrology

## 2020-07-16 DIAGNOSIS — D631 Anemia in chronic kidney disease: Secondary | ICD-10-CM | POA: Insufficient documentation

## 2020-07-16 DIAGNOSIS — N184 Chronic kidney disease, stage 4 (severe): Secondary | ICD-10-CM | POA: Diagnosis present

## 2020-07-16 LAB — RENAL FUNCTION PANEL
Albumin: 3.1 g/dL — ABNORMAL LOW (ref 3.5–5.0)
Anion gap: 12 (ref 5–15)
BUN: 93 mg/dL — ABNORMAL HIGH (ref 8–23)
CO2: 24 mmol/L (ref 22–32)
Calcium: 8.1 mg/dL — ABNORMAL LOW (ref 8.9–10.3)
Chloride: 101 mmol/L (ref 98–111)
Creatinine, Ser: 3.78 mg/dL — ABNORMAL HIGH (ref 0.61–1.24)
GFR, Estimated: 16 mL/min — ABNORMAL LOW (ref >60)
Glucose, Bld: 88 mg/dL (ref 70–99)
Phosphorus: 5.7 mg/dL — ABNORMAL HIGH (ref 2.5–4.6)
Potassium: 3.6 mmol/L (ref 3.5–5.1)
Sodium: 137 mmol/L (ref 135–145)

## 2020-07-16 LAB — IRON AND TIBC
Iron: 42 ug/dL — ABNORMAL LOW (ref 45–182)
Saturation Ratios: 20 % (ref 17.9–39.5)
TIBC: 205 ug/dL — ABNORMAL LOW (ref 250–450)
UIBC: 163 ug/dL

## 2020-07-16 LAB — CBC
HCT: 27.6 % — ABNORMAL LOW (ref 39.0–52.0)
Hemoglobin: 8.3 g/dL — ABNORMAL LOW (ref 13.0–17.0)
MCH: 28.1 pg (ref 26.0–34.0)
MCHC: 30.1 g/dL (ref 30.0–36.0)
MCV: 93.6 fL (ref 80.0–100.0)
Platelets: 119 10*3/uL — ABNORMAL LOW (ref 150–400)
RBC: 2.95 MIL/uL — ABNORMAL LOW (ref 4.22–5.81)
RDW: 17.9 % — ABNORMAL HIGH (ref 11.5–15.5)
WBC: 4 10*3/uL (ref 4.0–10.5)
nRBC: 0 % (ref 0.0–0.2)

## 2020-07-16 LAB — PROTEIN / CREATININE RATIO, URINE
Creatinine, Urine: 79.1 mg/dL
Protein Creatinine Ratio: 3.48 mg/mg{Cre} — ABNORMAL HIGH (ref 0.00–0.15)
Total Protein, Urine: 275 mg/dL

## 2020-07-16 LAB — POCT HEMOGLOBIN-HEMACUE: Hemoglobin: 8.7 g/dL — ABNORMAL LOW (ref 13.0–17.0)

## 2020-07-16 LAB — VITAMIN D 25 HYDROXY (VIT D DEFICIENCY, FRACTURES): Vit D, 25-Hydroxy: 52.5 ng/mL (ref 30–100)

## 2020-07-16 MED ORDER — EPOETIN ALFA 20000 UNIT/ML IJ SOLN: 20000.0000 [IU] | Freq: Once | INTRAMUSCULAR | Status: AC

## 2020-07-16 MED ORDER — EPOETIN ALFA 20000 UNIT/ML IJ SOLN
INTRAMUSCULAR | Status: AC
  Administered 2020-07-16: 20000 [IU] via SUBCUTANEOUS
  Filled 2020-07-16: qty 1

## 2020-07-17 LAB — PARATHYROID HORMONE, INTACT (NO CA): PTH: 61 pg/mL (ref 15–65)

## 2020-07-24 ENCOUNTER — Other Ambulatory Visit: Payer: Self-pay

## 2020-07-24 ENCOUNTER — Encounter (HOSPITAL_COMMUNITY)
Admission: RE | Admit: 2020-07-24 | Discharge: 2020-07-24 | Disposition: A | Discharge status: Home / Self Care | Payer: No Typology Code available for payment source | Source: Ambulatory Visit | Attending: Nephrology | Admitting: Nephrology

## 2020-07-24 ENCOUNTER — Encounter (HOSPITAL_COMMUNITY): Payer: Self-pay

## 2020-07-24 DIAGNOSIS — N184 Chronic kidney disease, stage 4 (severe): Secondary | ICD-10-CM | POA: Diagnosis not present

## 2020-07-24 LAB — POCT HEMOGLOBIN-HEMACUE: Hemoglobin: 8.8 g/dL — ABNORMAL LOW (ref 13.0–17.0)

## 2020-07-24 MED ORDER — EPOETIN ALFA 10000 UNIT/ML IJ SOLN: 10000.0000 [IU] | Freq: Once | INTRAMUSCULAR | Status: DC

## 2020-07-24 MED ORDER — EPOETIN ALFA 20000 UNIT/ML IJ SOLN: 20000.0000 [IU] | Freq: Once | INTRAMUSCULAR | Status: DC

## 2020-07-24 MED ORDER — EPOETIN ALFA 10000 UNIT/ML IJ SOLN
INTRAMUSCULAR | Status: AC
  Filled 2020-07-24: qty 2

## 2020-08-03 ENCOUNTER — Other Ambulatory Visit: Payer: Self-pay

## 2020-08-03 ENCOUNTER — Encounter (HOSPITAL_COMMUNITY): Payer: Self-pay

## 2020-08-03 ENCOUNTER — Encounter (HOSPITAL_COMMUNITY)
Admission: RE | Admit: 2020-08-03 | Discharge: 2020-08-03 | Disposition: A | Discharge status: Home / Self Care | Payer: No Typology Code available for payment source | Source: Ambulatory Visit | Attending: Nephrology | Admitting: Nephrology

## 2020-08-03 DIAGNOSIS — N184 Chronic kidney disease, stage 4 (severe): Secondary | ICD-10-CM | POA: Diagnosis not present

## 2020-08-03 LAB — POCT HEMOGLOBIN-HEMACUE: Hemoglobin: 7.9 g/dL — ABNORMAL LOW (ref 13.0–17.0)

## 2020-08-03 MED ORDER — EPOETIN ALFA 20000 UNIT/ML IJ SOLN
20000.0000 [IU] | Freq: Once | INTRAMUSCULAR | Status: AC
  Administered 2020-08-03: 20000 [IU] via SUBCUTANEOUS
  Filled 2020-08-03: qty 1

## 2020-08-08 ENCOUNTER — Inpatient Hospital Stay (HOSPITAL_COMMUNITY)
Admission: EM | Admit: 2020-08-08 | Discharge: 2020-08-11 | DRG: 291 | Disposition: A | Discharge status: Home / Self Care | Payer: No Typology Code available for payment source | Attending: Family Medicine | Admitting: Family Medicine

## 2020-08-08 ENCOUNTER — Other Ambulatory Visit: Payer: Self-pay

## 2020-08-08 ENCOUNTER — Emergency Department (HOSPITAL_COMMUNITY): Payer: No Typology Code available for payment source

## 2020-08-08 ENCOUNTER — Encounter (HOSPITAL_COMMUNITY): Payer: Self-pay | Admitting: Emergency Medicine

## 2020-08-08 DIAGNOSIS — Z888 Allergy status to other drugs, medicaments and biological substances status: Secondary | ICD-10-CM

## 2020-08-08 DIAGNOSIS — E785 Hyperlipidemia, unspecified: Secondary | ICD-10-CM | POA: Diagnosis present

## 2020-08-08 DIAGNOSIS — K8020 Calculus of gallbladder without cholecystitis without obstruction: Secondary | ICD-10-CM | POA: Diagnosis present

## 2020-08-08 DIAGNOSIS — T501X6A Underdosing of loop [high-ceiling] diuretics, initial encounter: Secondary | ICD-10-CM | POA: Diagnosis present

## 2020-08-08 DIAGNOSIS — Z79890 Hormone replacement therapy (postmenopausal): Secondary | ICD-10-CM

## 2020-08-08 DIAGNOSIS — N281 Cyst of kidney, acquired: Secondary | ICD-10-CM | POA: Diagnosis present

## 2020-08-08 DIAGNOSIS — Z20822 Contact with and (suspected) exposure to covid-19: Secondary | ICD-10-CM | POA: Diagnosis present

## 2020-08-08 DIAGNOSIS — G4733 Obstructive sleep apnea (adult) (pediatric): Secondary | ICD-10-CM | POA: Diagnosis present

## 2020-08-08 DIAGNOSIS — E871 Hypo-osmolality and hyponatremia: Secondary | ICD-10-CM | POA: Diagnosis present

## 2020-08-08 DIAGNOSIS — I252 Old myocardial infarction: Secondary | ICD-10-CM | POA: Diagnosis not present

## 2020-08-08 DIAGNOSIS — N179 Acute kidney failure, unspecified: Secondary | ICD-10-CM | POA: Diagnosis not present

## 2020-08-08 DIAGNOSIS — Z955 Presence of coronary angioplasty implant and graft: Secondary | ICD-10-CM

## 2020-08-08 DIAGNOSIS — I5033 Acute on chronic diastolic (congestive) heart failure: Secondary | ICD-10-CM | POA: Diagnosis present

## 2020-08-08 DIAGNOSIS — K760 Fatty (change of) liver, not elsewhere classified: Secondary | ICD-10-CM | POA: Diagnosis present

## 2020-08-08 DIAGNOSIS — J9601 Acute respiratory failure with hypoxia: Secondary | ICD-10-CM | POA: Diagnosis present

## 2020-08-08 DIAGNOSIS — I2510 Atherosclerotic heart disease of native coronary artery without angina pectoris: Secondary | ICD-10-CM | POA: Diagnosis present

## 2020-08-08 DIAGNOSIS — Z9111 Patient's noncompliance with dietary regimen: Secondary | ICD-10-CM | POA: Diagnosis not present

## 2020-08-08 DIAGNOSIS — E039 Hypothyroidism, unspecified: Secondary | ICD-10-CM | POA: Diagnosis present

## 2020-08-08 DIAGNOSIS — Z9114 Patient's other noncompliance with medication regimen: Secondary | ICD-10-CM | POA: Diagnosis not present

## 2020-08-08 DIAGNOSIS — K219 Gastro-esophageal reflux disease without esophagitis: Secondary | ICD-10-CM | POA: Diagnosis present

## 2020-08-08 DIAGNOSIS — I5030 Unspecified diastolic (congestive) heart failure: Secondary | ICD-10-CM | POA: Diagnosis present

## 2020-08-08 DIAGNOSIS — I132 Hypertensive heart and chronic kidney disease with heart failure and with stage 5 chronic kidney disease, or end stage renal disease: Secondary | ICD-10-CM | POA: Diagnosis not present

## 2020-08-08 DIAGNOSIS — I509 Heart failure, unspecified: Secondary | ICD-10-CM

## 2020-08-08 DIAGNOSIS — Z7902 Long term (current) use of antithrombotics/antiplatelets: Secondary | ICD-10-CM

## 2020-08-08 DIAGNOSIS — J449 Chronic obstructive pulmonary disease, unspecified: Secondary | ICD-10-CM | POA: Diagnosis not present

## 2020-08-08 DIAGNOSIS — M1990 Unspecified osteoarthritis, unspecified site: Secondary | ICD-10-CM | POA: Diagnosis present

## 2020-08-08 DIAGNOSIS — N400 Enlarged prostate without lower urinary tract symptoms: Secondary | ICD-10-CM | POA: Diagnosis present

## 2020-08-08 DIAGNOSIS — N189 Chronic kidney disease, unspecified: Secondary | ICD-10-CM | POA: Diagnosis present

## 2020-08-08 DIAGNOSIS — I5020 Unspecified systolic (congestive) heart failure: Secondary | ICD-10-CM

## 2020-08-08 DIAGNOSIS — D631 Anemia in chronic kidney disease: Secondary | ICD-10-CM | POA: Diagnosis not present

## 2020-08-08 DIAGNOSIS — Z8249 Family history of ischemic heart disease and other diseases of the circulatory system: Secondary | ICD-10-CM

## 2020-08-08 DIAGNOSIS — Z79899 Other long term (current) drug therapy: Secondary | ICD-10-CM

## 2020-08-08 DIAGNOSIS — Z951 Presence of aortocoronary bypass graft: Secondary | ICD-10-CM

## 2020-08-08 DIAGNOSIS — Z8673 Personal history of transient ischemic attack (TIA), and cerebral infarction without residual deficits: Secondary | ICD-10-CM

## 2020-08-08 DIAGNOSIS — R0600 Dyspnea, unspecified: Secondary | ICD-10-CM

## 2020-08-08 DIAGNOSIS — I10 Essential (primary) hypertension: Secondary | ICD-10-CM | POA: Diagnosis present

## 2020-08-08 DIAGNOSIS — I214 Non-ST elevation (NSTEMI) myocardial infarction: Secondary | ICD-10-CM | POA: Diagnosis present

## 2020-08-08 DIAGNOSIS — F17210 Nicotine dependence, cigarettes, uncomplicated: Secondary | ICD-10-CM | POA: Diagnosis present

## 2020-08-08 DIAGNOSIS — N185 Chronic kidney disease, stage 5: Secondary | ICD-10-CM | POA: Diagnosis present

## 2020-08-08 LAB — CBC WITH DIFFERENTIAL/PLATELET
Abs Immature Granulocytes: 0.01 10*3/uL (ref 0.00–0.07)
Basophils Absolute: 0 10*3/uL (ref 0.0–0.1)
Basophils Relative: 1 %
Eosinophils Absolute: 0.1 10*3/uL (ref 0.0–0.5)
Eosinophils Relative: 2 %
HCT: 27.6 % — ABNORMAL LOW (ref 39.0–52.0)
Hemoglobin: 8.6 g/dL — ABNORMAL LOW (ref 13.0–17.0)
Immature Granulocytes: 0 %
Lymphocytes Relative: 20 %
Lymphs Abs: 0.9 10*3/uL (ref 0.7–4.0)
MCH: 28.3 pg (ref 26.0–34.0)
MCHC: 31.2 g/dL (ref 30.0–36.0)
MCV: 90.8 fL (ref 80.0–100.0)
Monocytes Absolute: 0.4 10*3/uL (ref 0.1–1.0)
Monocytes Relative: 7 %
Neutro Abs: 3.4 10*3/uL (ref 1.7–7.7)
Neutrophils Relative %: 70 %
Platelets: 154 10*3/uL (ref 150–400)
RBC: 3.04 MIL/uL — ABNORMAL LOW (ref 4.22–5.81)
RDW: 17.8 % — ABNORMAL HIGH (ref 11.5–15.5)
WBC: 4.8 10*3/uL (ref 4.0–10.5)
nRBC: 0 % (ref 0.0–0.2)

## 2020-08-08 LAB — RESP PANEL BY RT-PCR (FLU A&B, COVID) ARPGX2
Influenza A by PCR: NEGATIVE
Influenza B by PCR: NEGATIVE
SARS Coronavirus 2 by RT PCR: NEGATIVE

## 2020-08-08 LAB — TROPONIN I (HIGH SENSITIVITY)
Troponin I (High Sensitivity): 279 ng/L (ref <18)
Troponin I (High Sensitivity): 323 ng/L (ref <18)
Troponin I (High Sensitivity): 300 ng/L (ref <18)

## 2020-08-08 LAB — COMPREHENSIVE METABOLIC PANEL
ALT: 17 U/L (ref 0–44)
AST: 21 U/L (ref 15–41)
Albumin: 3.2 g/dL — ABNORMAL LOW (ref 3.5–5.0)
Alkaline Phosphatase: 75 U/L (ref 38–126)
Anion gap: 12 (ref 5–15)
BUN: 98 mg/dL — ABNORMAL HIGH (ref 8–23)
CO2: 22 mmol/L (ref 22–32)
Calcium: 8.1 mg/dL — ABNORMAL LOW (ref 8.9–10.3)
Chloride: 106 mmol/L (ref 98–111)
Creatinine, Ser: 3.72 mg/dL — ABNORMAL HIGH (ref 0.61–1.24)
GFR, Estimated: 16 mL/min — ABNORMAL LOW (ref >60)
Glucose, Bld: 129 mg/dL — ABNORMAL HIGH (ref 70–99)
Potassium: 3.3 mmol/L — ABNORMAL LOW (ref 3.5–5.1)
Sodium: 140 mmol/L (ref 135–145)
Total Bilirubin: 0.7 mg/dL (ref 0.3–1.2)
Total Protein: 6.4 g/dL — ABNORMAL LOW (ref 6.5–8.1)

## 2020-08-08 LAB — BRAIN NATRIURETIC PEPTIDE: B Natriuretic Peptide: 2081 pg/mL — ABNORMAL HIGH (ref 0.0–100.0)

## 2020-08-08 LAB — TSH: TSH: 2.122 u[IU]/mL (ref 0.350–4.500)

## 2020-08-08 MED ORDER — PANTOPRAZOLE SODIUM 40 MG PO TBEC
40.0000 mg | DELAYED_RELEASE_TABLET | Freq: Every day | ORAL | Status: DC
  Administered 2020-08-09 – 2020-08-11 (×3): 40 mg via ORAL
  Filled 2020-08-08 (×4): qty 1

## 2020-08-08 MED ORDER — HYDRALAZINE HCL 25 MG PO TABS
100.0000 mg | ORAL_TABLET | Freq: Three times a day (TID) | ORAL | Status: DC
  Administered 2020-08-08 – 2020-08-11 (×9): 100 mg via ORAL
  Filled 2020-08-08 (×9): qty 4

## 2020-08-08 MED ORDER — TRAZODONE HCL 50 MG PO TABS
50.0000 mg | ORAL_TABLET | Freq: Every evening | ORAL | Status: DC | PRN
Start: 2020-08-08 — End: 2020-08-11

## 2020-08-08 MED ORDER — VITAMIN D 25 MCG (1000 UNIT) PO TABS
1000.0000 [IU] | ORAL_TABLET | Freq: Every day | ORAL | Status: DC
  Administered 2020-08-08 – 2020-08-11 (×4): 1000 [IU] via ORAL
  Filled 2020-08-08 (×4): qty 1

## 2020-08-08 MED ORDER — FINASTERIDE 5 MG PO TABS
5.0000 mg | ORAL_TABLET | Freq: Every day | ORAL | Status: DC
  Administered 2020-08-09 – 2020-08-11 (×3): 5 mg via ORAL
  Filled 2020-08-08 (×3): qty 1

## 2020-08-08 MED ORDER — POTASSIUM CHLORIDE 20 MEQ PO PACK
20.0000 meq | PACK | Freq: Every day | ORAL | Status: DC
  Administered 2020-08-09 – 2020-08-11 (×3): 20 meq via ORAL
  Filled 2020-08-08 (×4): qty 1

## 2020-08-08 MED ORDER — POTASSIUM CHLORIDE 20 MEQ PO PACK
20.0000 meq | PACK | Freq: Every day | ORAL | Status: DC
Start: 2020-08-08 — End: 2020-08-08

## 2020-08-08 MED ORDER — CLOPIDOGREL BISULFATE 75 MG PO TABS
75.0000 mg | ORAL_TABLET | Freq: Every day | ORAL | Status: DC
  Administered 2020-08-09 – 2020-08-11 (×3): 75 mg via ORAL
  Filled 2020-08-08 (×3): qty 1

## 2020-08-08 MED ORDER — AMLODIPINE BESYLATE 5 MG PO TABS
10.0000 mg | ORAL_TABLET | Freq: Every day | ORAL | Status: DC
  Administered 2020-08-09 – 2020-08-11 (×3): 10 mg via ORAL
  Filled 2020-08-08 (×3): qty 2

## 2020-08-08 MED ORDER — FOLIC ACID 1 MG PO TABS
1.0000 mg | ORAL_TABLET | Freq: Every day | ORAL | Status: DC
  Administered 2020-08-09 – 2020-08-11 (×3): 1 mg via ORAL
  Filled 2020-08-08 (×3): qty 1

## 2020-08-08 MED ORDER — SODIUM CHLORIDE 0.9 % IV SOLN: 250.0000 mL | INTRAVENOUS | Status: DC | PRN

## 2020-08-08 MED ORDER — ONDANSETRON HCL 4 MG/2ML IJ SOLN: 4.0000 mg | Freq: Four times a day (QID) | INTRAMUSCULAR | Status: DC | PRN

## 2020-08-08 MED ORDER — CARVEDILOL 3.125 MG PO TABS
3.1250 mg | ORAL_TABLET | Freq: Two times a day (BID) | ORAL | Status: DC
  Administered 2020-08-09 – 2020-08-11 (×5): 3.125 mg via ORAL
  Filled 2020-08-08 (×6): qty 1

## 2020-08-08 MED ORDER — SODIUM CHLORIDE 0.9% FLUSH: 3.0000 mL | INTRAVENOUS | Status: DC | PRN

## 2020-08-08 MED ORDER — POTASSIUM CHLORIDE CRYS ER 20 MEQ PO TBCR
40.0000 meq | EXTENDED_RELEASE_TABLET | ORAL | Status: AC
  Administered 2020-08-08: 40 meq via ORAL
  Filled 2020-08-08: qty 2

## 2020-08-08 MED ORDER — FUROSEMIDE 10 MG/ML IJ SOLN
60.0000 mg | Freq: Three times a day (TID) | INTRAMUSCULAR | Status: DC
  Administered 2020-08-08 – 2020-08-09 (×3): 60 mg via INTRAVENOUS
  Filled 2020-08-08 (×3): qty 6

## 2020-08-08 MED ORDER — POLYETHYLENE GLYCOL 3350 17 G PO PACK: 17.0000 g | PACK | Freq: Every day | ORAL | Status: DC | PRN

## 2020-08-08 MED ORDER — ACETAMINOPHEN 650 MG RE SUPP: 650.0000 mg | Freq: Four times a day (QID) | RECTAL | Status: DC | PRN

## 2020-08-08 MED ORDER — FUROSEMIDE 10 MG/ML IJ SOLN: 40.0000 mg | Freq: Once | INTRAMUSCULAR | Status: DC

## 2020-08-08 MED ORDER — ALBUTEROL SULFATE (2.5 MG/3ML) 0.083% IN NEBU: 2.5000 mg | INHALATION_SOLUTION | RESPIRATORY_TRACT | Status: DC | PRN

## 2020-08-08 MED ORDER — OMEGA-3-ACID ETHYL ESTERS 1 G PO CAPS
2000.0000 mg | ORAL_CAPSULE | Freq: Two times a day (BID) | ORAL | Status: DC
  Administered 2020-08-08 – 2020-08-11 (×6): 2000 mg via ORAL
  Filled 2020-08-08 (×6): qty 2

## 2020-08-08 MED ORDER — FUROSEMIDE 10 MG/ML IJ SOLN
80.0000 mg | Freq: Once | INTRAMUSCULAR | Status: AC
  Administered 2020-08-08: 80 mg via INTRAVENOUS
  Filled 2020-08-08: qty 8

## 2020-08-08 MED ORDER — ATORVASTATIN CALCIUM 40 MG PO TABS
80.0000 mg | ORAL_TABLET | Freq: Every day | ORAL | Status: DC
Start: 2020-08-08 — End: 2020-08-11
  Administered 2020-08-09 – 2020-08-11 (×3): 80 mg via ORAL
  Filled 2020-08-08 (×4): qty 2

## 2020-08-08 MED ORDER — BISACODYL 10 MG RE SUPP
10.0000 mg | Freq: Every day | RECTAL | Status: DC | PRN
Start: 2020-08-08 — End: 2020-08-11

## 2020-08-08 MED ORDER — ISOSORBIDE MONONITRATE ER 60 MG PO TB24
120.0000 mg | ORAL_TABLET | Freq: Every day | ORAL | Status: DC
  Administered 2020-08-09 – 2020-08-11 (×3): 120 mg via ORAL
  Filled 2020-08-08 (×3): qty 2

## 2020-08-08 MED ORDER — SODIUM CHLORIDE 0.9% FLUSH
3.0000 mL | Freq: Two times a day (BID) | INTRAVENOUS | Status: DC
  Administered 2020-08-08 – 2020-08-11 (×4): 3 mL via INTRAVENOUS

## 2020-08-08 MED ORDER — LEVOTHYROXINE SODIUM 50 MCG PO TABS
50.0000 ug | ORAL_TABLET | Freq: Every day | ORAL | Status: DC
  Administered 2020-08-09 – 2020-08-11 (×3): 50 ug via ORAL
  Filled 2020-08-08: qty 2
  Filled 2020-08-08 (×2): qty 1

## 2020-08-08 MED ORDER — ONDANSETRON HCL 4 MG PO TABS: 4.0000 mg | ORAL_TABLET | Freq: Four times a day (QID) | ORAL | Status: DC | PRN

## 2020-08-08 MED ORDER — ACETAMINOPHEN 325 MG PO TABS
650.0000 mg | ORAL_TABLET | Freq: Four times a day (QID) | ORAL | Status: DC | PRN
  Administered 2020-08-09: 650 mg via ORAL
  Filled 2020-08-08: qty 2

## 2020-08-08 MED ORDER — HEPARIN SODIUM (PORCINE) 5000 UNIT/ML IJ SOLN
5000.0000 [IU] | Freq: Three times a day (TID) | INTRAMUSCULAR | Status: DC
  Administered 2020-08-08 – 2020-08-11 (×8): 5000 [IU] via SUBCUTANEOUS
  Filled 2020-08-08 (×8): qty 1

## 2020-08-08 MED ORDER — TAMSULOSIN HCL 0.4 MG PO CAPS
0.4000 mg | ORAL_CAPSULE | Freq: Every day | ORAL | Status: DC
  Administered 2020-08-09 – 2020-08-11 (×3): 0.4 mg via ORAL
  Filled 2020-08-08 (×4): qty 1

## 2020-08-08 MED ORDER — SODIUM CHLORIDE 0.9% FLUSH
3.0000 mL | Freq: Two times a day (BID) | INTRAVENOUS | Status: DC
  Administered 2020-08-08 – 2020-08-11 (×6): 3 mL via INTRAVENOUS

## 2020-08-08 NOTE — ED Provider Notes (Signed)
[facility] EMERGENCY DEPARTMENT Provider Note   CSN: [id number] Arrival date & time: 08/08/20  0736     History Chief Complaint  Patient presents with  . Shortness of Breath    [name] is a 75 y.o. male.  Patient complains of shortness of breath.  Patient has a history of congestive heart failure and renal failure.  He is not on dialysis.  He also has history of coronary disease with bypass surgery  The history is provided by the patient and medical records.  Shortness of Breath Severity:  Moderate Onset quality:  Sudden Timing:  Constant Progression:  Worsening Chronicity:  Recurrent Context: activity   Relieved by:  Nothing Worsened by:  Nothing Ineffective treatments:  None tried Associated symptoms: no abdominal pain, no chest pain, no cough, no headaches and no rash        Past Medical History:  Diagnosis Date  . Acute myocardial infarction of other inferior wall, initial episode of care   . Anemia   . Anginal pain ([place])   . Anxiety   . Arthritis   . Cancer [facility])    bladder 2013 removed, has cystoscopy 10/2016, has returned x 2  . COPD (chronic obstructive pulmonary disease) ([place])   . Coronary artery disease    Artery bypass graft Jan 2008  . Dyspnea   . Dysrhythmia   . GERD (gastroesophageal reflux disease)   . Heart murmur   . History of hiatal hernia   . Hyperlipidemia   . Hypertension   . Hypothyroidism   . PONV (postoperative nausea and vomiting)   . Sleep apnea   . Stroke ([place])   . Tobacco user     Patient Active Problem List   Diagnosis Date Noted  . Acute diastolic CHF (congestive heart failure) ([place]) 05/09/2020  . Acute exacerbation of Diastolic CHF (congestive heart failure) ([place]) 05/08/2020  . Symptomatic bradycardia with heart failure 05/08/2020  . COPD without exacerbation (HCC)---40 Pack Years 05/08/2020  . Acute respiratory failure with hypoxia ([place]) 05/08/2020  . Uremia in the setting of AKI on CKD V 02/07/2020  . CKD  (chronic kidney disease), stage V ([place]) 02/07/2020  . Coronary artery disease involving native coronary artery of native heart without angina pectoris 12/16/2019  . 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 Gastroenteritis 12/16/2019  . History of bladder cancer 12/16/2019  . Hyperkalemia 12/16/2019  . (HFpEF) heart failure with preserved ejection fraction ([place]) 05/17/2019  . AKI (acute kidney injury) on CKD V 05/16/2019  . Proteinuria 03/28/2019  . Acute blood loss anemia 06/08/2018  . Anemia in chronic kidney disease 06/08/2018  . BPH (benign prostatic hyperplasia) 06/08/2018  . Coronary artery disease with unstable angina pectoris ([place]) 06/08/2018  . Elevated troponin I level 06/08/2018  . GIB (gastrointestinal bleeding) 06/07/2018  . Coronary artery disease involving autologous artery coronary bypass graft 06/29/2017  . 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 NSTEMI (non-ST elevated myocardial infarction) ([place]) 09/14/2016  . Chest pain 09/14/2016  . Obstructive sleep apnea 09/14/2016  . Acute myocardial infarction of other inferior wall, initial episode of care   . Unstable angina ([place]) 01/30/2014  . OVERWEIGHT/OBESITY 02/02/2010  . HYPERLIPIDEMIA, MIXED 01/22/2009  . OTHER CHEST PAIN 01/22/2009  . TOBACCO USER 01/17/2009  . Essential hypertension 01/17/2009    Past Surgical History:  Procedure Laterality Date  . APPENDECTOMY    . AV FISTULA PLACEMENT Right 02/11/2020   Procedure: RIGHT ARM BRACHIOCEPHALIC ARTERIOVENOUS (AV) FISTULA;  Surgeon: Deitra Mayo  S, MD;  Location: [place];  Service: Vascular;  Laterality: Right;  . CARDIAC CATHETERIZATION    . CORONARY ARTERY BYPASS GRAFT  06/14/2006   x 5  . CORONARY ARTERY BYPASS GRAFT N/A 09/19/2016   Procedure: REDO CORONARY ARTERY BYPASS GRAFTING (CABG) x two, using right internal mammary artery and left radial artery;  Surgeon: [name], MD;  Location: [place];  Service: Open Heart Surgery;  Laterality: N/A;  . FOOT  SURGERY    . HERNIA REPAIR    . LEFT HEART CATH AND CORS/GRAFTS ANGIOGRAPHY N/A 09/14/2016   Procedure: Left Heart Cath and Cors/Grafts Angiography;  Surgeon: Troy Sine, MD;  Location: [place] CV LAB;  Service: Cardiovascular;  Laterality: N/A;  . LEFT HEART CATHETERIZATION WITH CORONARY/GRAFT ANGIOGRAM N/A 01/31/2014   Procedure: LEFT HEART CATHETERIZATION WITH Beatrix Fetters;  Surgeon: Sanda Klein, MD;  Location: [place] CATH LAB;  Service: Cardiovascular;  Laterality: N/A;  . NOSE SURGERY    . PERCUTANEOUS CORONARY STENT INTERVENTION (PCI-S)  01/31/2014   Procedure: PERCUTANEOUS CORONARY STENT INTERVENTION (PCI-S);  Surgeon: Sanda Klein, MD;  Location: [facility] CATH LAB;  Service: Cardiovascular;;  . RADIAL ARTERY HARVEST Left 09/19/2016   Procedure: RADIAL ARTERY HARVEST;  Surgeon: [name], MD;  Location: [place];  Service: Open Heart Surgery;  Laterality: Left;  . TEE WITHOUT CARDIOVERSION N/A 09/19/2016   Procedure: TRANSESOPHAGEAL ECHOCARDIOGRAM (TEE);  Surgeon: [name], MD;  Location: [place];  Service: Open Heart Surgery;  Laterality: N/A;       Family History  Problem Relation Age of Onset  . Heart attack Mother   . Heart attack Brother     Social History   Tobacco Use  . Smoking status: Former Smoker    Packs/day: 1.00    Years: 40.00    Pack years: 40.00    Types: Cigarettes    Start date: 02/20/1959    Quit date: 09/13/2016    Years since quitting: 3.9  . Smokeless tobacco: Never Used  . Tobacco comment: 10/7 2-8 cigarettes per day. cutting back- AJ  Vaping Use  . Vaping Use: Never used  Substance Use Topics  . Alcohol use: No    Alcohol/week: 0.0 standard drinks    Comment: "No, not really"  . Drug use: No    Home Medications Prior to Admission medications   Medication Sig Start Date End Date Taking? Authorizing Provider  allopurinol (ZYLOPRIM) 100 MG tablet Take 100 mg by mouth daily.  05/03/19  Yes [provider]  amLODipine  (NORVASC) 10 MG tablet Take 1 tablet (10 mg total) by mouth daily. 05/18/19  Yes Emokpae, Courage, MD  atorvastatin (LIPITOR) 80 MG tablet Take 80 mg by mouth daily.   Yes [provider]  carvedilol (COREG) 3.125 MG tablet Take 1 tablet (3.125 mg total) by mouth 2 (two) times daily. 05/10/20 05/10/21 Yes Debbe Odea, MD  clopidogrel (PLAVIX) 75 MG tablet Take 75 mg by mouth daily.   Yes [provider]  famotidine (PEPCID) 40 MG tablet Take 40 mg by mouth daily.   Yes [provider]  finasteride (PROSCAR) 5 MG tablet Take 5 mg by mouth daily.   Yes [provider]  folic acid (FOLVITE) 1 MG tablet Take 1 mg by mouth daily.   Yes [provider]  furosemide (LASIX) 40 MG tablet Take 1-1.5 tablets (40-60 mg total) by mouth See admin instructions. Take 1.5 tablets (60 mg totally) by mouth in the morning; take  1 tablet (40 mg totally) by mouth in the evening 05/10/20  Yes Rizwan, Eunice Blase, MD  hydrALAZINE (APRESOLINE) 100 MG tablet Take 1 tablet (100 mg total) by mouth 3 (three) times daily. 05/18/19  Yes Roxan Hockey, MD  isosorbide mononitrate (IMDUR) 120 MG 24 hr tablet Take 1 tablet (120 mg total) by mouth daily. Patient taking differently: Take 240 mg by mouth daily. 05/18/19  Yes Emokpae, Courage, MD  KLOR-CON M20 20 MEQ tablet Take 20 mEq by mouth daily. Take one tablet Monday Wednesday and Friday 03/27/20  Yes [provider]  levothyroxine (SYNTHROID, LEVOTHROID) 50 MCG tablet Take 50 mcg by mouth daily before breakfast.   Yes [provider]  nitroGLYCERIN (NITROSTAT) 0.4 MG SL tablet Place 0.4 mg under the tongue every 5 (five) minutes as needed for chest pain (max 3 doses).   Yes [provider]  Omega-3 Fatty Acids (RA FISH OIL) 1000 MG CAPS Take 2,000 mg by mouth 2 (two) times daily.   Yes [provider]  omeprazole (PRILOSEC) 20 MG capsule Take 20 mg by mouth 2 (two) times daily. 02/14/20  Yes [provider]  tamsulosin (FLOMAX) 0.4 MG CAPS capsule Take 0.4 mg by mouth daily.   Yes [provider]  Vitamin D, Cholecalciferol, 25 MCG (1000 UT) CAPS Take 1,000 Units by mouth daily.   Yes [provider]  acetaminophen (TYLENOL) 325 MG tablet Take 2 tablets (650 mg total) by mouth every 6 (six) hours as needed for mild pain. 09/23/16   Nani Skillern, PA-C  epoetin alfa (EPOGEN) 20000 UNIT/ML injection Inject 20,000 Units into the skin every Monday. Patient not taking: Reported on 08/08/2020    [provider]  oxyCODONE-acetaminophen (PERCOCET) 5-325 MG tablet Take 1 tablet by mouth every 6 (six) hours as needed for severe pain. Patient not taking: Reported on 08/08/2020 02/11/20   Gabriel Earing, PA-C    Allergies    Cardizem cd [diltiazem hcl er beads]  Review of Systems   Review of Systems  Constitutional: Negative for appetite change and fatigue.  HENT: Negative for congestion, ear discharge and sinus pressure.   Eyes: Negative for discharge.  Respiratory: Positive for shortness of breath. Negative for cough.   Cardiovascular: Negative for chest pain.  Gastrointestinal: Negative for abdominal pain and diarrhea.  Genitourinary: Negative for frequency and hematuria.  Musculoskeletal: Negative for back pain.  Skin: Negative for rash.  Neurological: Negative for seizures and headaches.  Psychiatric/Behavioral: Negative for hallucinations.    Physical Exam Updated Vital Signs BP (!) 139/42   Pulse 61   Resp 17   Ht 5\' 9"  (1.753 m)   Wt 86.2 kg   SpO2 95%   BMI 28.06 kg/m   Physical Exam Vitals and nursing note reviewed.  Constitutional:      Appearance: He is well-developed.  HENT:     Head: Normocephalic.     Nose: Nose normal.  Eyes:     General: No scleral icterus.    Conjunctiva/sclera: Conjunctivae normal.  Neck:     Thyroid: No thyromegaly.  Cardiovascular:     Rate and Rhythm: Normal rate and regular rhythm.      Heart sounds: No murmur heard. No friction rub. No gallop.   Pulmonary:     Breath sounds: No stridor. No wheezing or rales.  Chest:     Chest wall: No tenderness.  Abdominal:     General: There is no distension.     Tenderness: There is  no abdominal tenderness. There is no rebound.  Musculoskeletal:        General: Normal range of motion.     Cervical back: Neck supple.  Lymphadenopathy:     Cervical: No cervical adenopathy.  Skin:    Findings: No erythema or rash.  Neurological:     Mental Status: He is alert and oriented to person, place, and time.     Motor: No abnormal muscle tone.     Coordination: Coordination normal.  Psychiatric:        Behavior: Behavior normal.     ED Results / Procedures / Treatments   Labs (all labs ordered are listed, but only abnormal results are displayed) Labs Reviewed  CBC WITH DIFFERENTIAL/PLATELET - Abnormal; Notable for the following components:      Result Value   RBC 3.04 (*)    Hemoglobin 8.6 (*)    HCT 27.6 (*)    RDW 17.8 (*)    All other components within normal limits  COMPREHENSIVE METABOLIC PANEL - Abnormal; Notable for the following components:   Potassium 3.3 (*)    Glucose, Bld 129 (*)    BUN 98 (*)    Creatinine, Ser 3.72 (*)    Calcium 8.1 (*)    Total Protein 6.4 (*)    Albumin 3.2 (*)    GFR, Estimated 16 (*)    All other components within normal limits  BRAIN NATRIURETIC PEPTIDE - Abnormal; Notable for the following components:   B Natriuretic Peptide 2,081.0 (*)    All other components within normal limits  TROPONIN I (HIGH SENSITIVITY) - Abnormal; Notable for the following components:   Troponin I (High Sensitivity) 279 (*)    All other components within normal limits  RESP PANEL BY RT-PCR (FLU A&B, COVID) [id number]    EKG EKG Interpretation  Date/Time:  Saturday August 08 2020 07:57:12 EDT Ventricular Rate:  75 PR Interval:    QRS Duration: 100 QT Interval:  430 QTC Calculation: 481 R  Axis:   32 Text Interpretation: Sinus rhythm Atrial premature complex RSR' in V1 or V2, probably normal variant Confirmed by Milton Ferguson [phone]) on 08/08/2020 9:33:04 AM   Radiology DG Chest Port 1 View  Result Date: 08/08/2020 CLINICAL DATA:  Shortness of breath that started last night EXAM: PORTABLE CHEST 1 VIEW COMPARISON:  05/08/2020 FINDINGS: Normal heart size and mediastinal contours. Diffuse interstitial prominence similar to prior. Kerley lines are noted. No visible effusion or air leak. Artifact from EKG leads. IMPRESSION: Interstitial opacity favoring edema. Electronically Signed   By: Monte Fantasia M.D.   On: 08/08/2020 08:42    Procedures Procedures   Medications Ordered in ED Medications  furosemide (LASIX) injection 60 mg (has no administration in time range)  potassium chloride SA (KLOR-CON) CR tablet 40 mEq (has no administration in time range)  furosemide (LASIX) injection 80 mg (80 mg Intravenous Given 08/08/20 1000)    ED Course  I have reviewed the triage vital signs and the nursing notes.  Pertinent labs & imaging results that were available during my care of the patient were reviewed by me and considered in my medical decision making (see chart for details). CRITICAL CARE Performed by: Milton Ferguson Total critical care time: 40 minutes Critical care time was exclusive of separately billable procedures and treating other patients. Critical care was necessary to treat or prevent imminent or life-threatening deterioration. Critical care was time spent personally by me on the following activities: development of treatment plan with patient  and/or surrogate as well as nursing, discussions with consultants, evaluation of patient's response to treatment, examination of patient, obtaining history from patient or surrogate, ordering and performing treatments and interventions, ordering and review of laboratory studies, ordering and review of radiographic studies, pulse  oximetry and re-evaluation of patient's condition.   Patient with a history of congestive heart failure.  Patient has heart failure now with a BNP over 2000.  I spoke with cardiology Dr. Stanford Breed and he felt like the patient can be diuresed and admitted to medicine. MDM Rules/Calculators/A&P                          Patient with congestive heart failure shortness of breath.  He will be admitted to medicine and diuresis.  Possible cardiology and nephrology consult Final Clinical Impression(s) / ED Diagnoses Final diagnoses:  Systolic congestive heart failure, unspecified HF chronicity ([place])    Rx / DC Orders ED Discharge Orders    None       Milton Ferguson, MD 08/08/20 1037

## 2020-08-08 NOTE — H&P (Signed)
Patient Demographics:    [name], is a 75 y.o. male  MRN: [id number]   DOB - 1945/08/19  Admit Date - 08/08/2020  Outpatient Primary MD for the patient is Clinic, [place]:    Principal Problem:   Acute exacerbation of Diastolic CHF (congestive heart failure) (HCC) Active Problems:   (HFpEF) heart failure with preserved ejection fraction (HCC)   AKI (acute kidney injury) on CKD V   COPD without exacerbation (HCC)---40 Pack Years   Essential hypertension   NSTEMI (non-ST elevated myocardial infarction) ([place])   Obstructive sleep apnea   Anemia in chronic kidney disease   Coronary artery disease involving native coronary artery of native heart without angina pectoris    1)HFpEF--patient with h/o chronic diastolic dysfunction CHF--presenting with acute on chronic diastolic CHF exacerbation   --Echo from 05/09/2020 revealedecho with EF down to 55 to 60% fromprior EF of 60 to 65% with grade 1diastolic dysfunction -pt admits to non-compliance with dietary/salt restrictions, also admits to excessive fluid intake and noncompliance with Lasix- PTA -IV Lasix as ordered, daily fluid and weight monitoring -Monitor electrolytes closely with CKD stage V -BNP is  2,081--- which is higher than patient's prior baseline  - chest x-ray and clinical exam is consistent with CHF -  2)Elevated Troponin--serial EKG with sinus rhythm, QTC borderline elevated, borderline repolarization normalities without frank ischemic/ST segment changes -troponin 279>> 323 -Remains chest pain-free  3)CKD V ---   --Patient's primary nephrologist is Dr. Theador Hawthorne in [place] -Creatinine was 3.72 - which is close to patient's recent baseline  -Pt is s/p rightbrachiocephalic arteriovenous fistulaby Dr. Scot Dock  02/11/20 in anticipation of hemodialysis initiation in the near future -PTH and vitamin Dwere checked recently  -Watch renal function and electrolytes closely with IV diuresis for CHF -renally adjust medications, avoid nephrotoxic agents / dehydration / hypotension --Patient continues to have fair urine output at this time -Monitor electrolytes, fluid input/output and daily weight - Nephrology consult requested -Patient has 10.6 cm Lt renal cyst (not new)  4)Chronic normocytic and normochromic Anemia of CKD--- -Hemoglobin is 8.6 which is close to patient's recent baseline,  Procrit/ESA as per nephrologist -No bleeding concerns  5)CAD-prior CABG in 2008,LHC in 2015 with angioplasty and stenting--- -Troponin is elevated as above #2 in the setting of CKD V ----patient is chest pain-free currently  -ContinueLipitor, Plavix  and isosorbide  6)HTN--continue isosorbide/hydralazine combo, amlodipine 10 mg daily, and Coreg 3.25 mg daily  7)BPH--stable, continue Flomax and finasteride  8)Hypothyroidism---stable, continue Levothyroxine 50 mcg daily -Check TSH  9)10.6 CM-left renal cyst,nephrology consult pending, may need outpatient imaging surveillance/follow-up  10)H/o Fatty Liver and cholelithiasis----asymptomatic,  --continue Lipitor for CAD , watch LFTs closely  11)COPD--reformed smoker,  no acute exacerbation at this time -Bronchodilators ordered  12) acute hypoxic respiratory failure--- due to #1 above --ED O2 sats 85 to 88% on room air 90 to 92% on 2 L -Continue oxygen supplementation anticipate  improvement with diuresis  Disposition/Need for in-Hospital Stay- patient unable to be discharged at this time due to --acute hypoxic respiratory failure secondary to acute diastolic CHF exacerbation requiring IV diuresis and frequent monitoring of electrolytes and renal parameters  Dispo: The patient is from: Home  Anticipated d/c is to: Home   Anticipated d/c date is: 2 days              Patient currently is not medically stable to d/c. Barriers: Not Clinically Stable-    With History of - Reviewed by me  Past Medical History:  Diagnosis Date  . Acute myocardial infarction of other inferior wall, initial episode of care   . Anemia   . Anginal pain ([place])   . Anxiety   . Arthritis   . Cancer [facility])    bladder 2013 removed, has cystoscopy 10/2016, has returned x 2  . COPD (chronic obstructive pulmonary disease) ([place])   . Coronary artery disease    Artery bypass graft Jan 2008  . Dyspnea   . Dysrhythmia   . GERD (gastroesophageal reflux disease)   . Heart murmur   . History of hiatal hernia   . Hyperlipidemia   . Hypertension   . Hypothyroidism   . PONV (postoperative nausea and vomiting)   . Sleep apnea   . Stroke ([place])   . Tobacco user       Past Surgical History:  Procedure Laterality Date  . APPENDECTOMY    . AV FISTULA PLACEMENT Right 02/11/2020   Procedure: RIGHT ARM BRACHIOCEPHALIC ARTERIOVENOUS (AV) FISTULA;  Surgeon: Angelia Mould, MD;  Location: [place];  Service: Vascular;  Laterality: Right;  . CARDIAC CATHETERIZATION    . CORONARY ARTERY BYPASS GRAFT  06/14/2006   x 5  . CORONARY ARTERY BYPASS GRAFT N/A 09/19/2016   Procedure: REDO CORONARY ARTERY BYPASS GRAFTING (CABG) x two, using right internal mammary artery and left radial artery;  Surgeon: [name], MD;  Location: [place];  Service: Open Heart Surgery;  Laterality: N/A;  . FOOT SURGERY    . HERNIA REPAIR    . LEFT HEART CATH AND CORS/GRAFTS ANGIOGRAPHY N/A 09/14/2016   Procedure: Left Heart Cath and Cors/Grafts Angiography;  Surgeon: Troy Sine, MD;  Location: [place] CV LAB;  Service: Cardiovascular;  Laterality: N/A;  . LEFT HEART CATHETERIZATION WITH CORONARY/GRAFT ANGIOGRAM N/A 01/31/2014   Procedure: LEFT HEART CATHETERIZATION WITH Beatrix Fetters;  Surgeon: Sanda Klein, MD;  Location: [place] CATH LAB;  Service:  Cardiovascular;  Laterality: N/A;  . NOSE SURGERY    . PERCUTANEOUS CORONARY STENT INTERVENTION (PCI-S)  01/31/2014   Procedure: PERCUTANEOUS CORONARY STENT INTERVENTION (PCI-S);  Surgeon: Sanda Klein, MD;  Location: [facility] CATH LAB;  Service: Cardiovascular;;  . RADIAL ARTERY HARVEST Left 09/19/2016   Procedure: RADIAL ARTERY HARVEST;  Surgeon: [name], MD;  Location: [place];  Service: Open Heart Surgery;  Laterality: Left;  . TEE WITHOUT CARDIOVERSION N/A 09/19/2016   Procedure: TRANSESOPHAGEAL ECHOCARDIOGRAM (TEE);  Surgeon: [name], MD;  Location: [place];  Service: Open Heart Surgery;  Laterality: N/A;      Chief Complaint  Patient presents with  . Shortness of Breath      HPI:    [name]  is a 75 y.o. male with past medical history relevant for CAD(s/p prior CABG), COPD, HTN, BPHand CKD Vas well as chronic anemia of CKDpresents to the ED with worsening shortness of breath orthopnea and dyspnea on exertion over the last  week particularly worse over the last 24 hours --Patient has been having difficulty walking the dog, he cannot walk to the bathroom now with being very short of breath -ED O2 sats 85 to 88% on room air 90 to 92% on 2 L - -Additional history obtained from patient's wife at bedside -pt admits to non-compliance with dietary/salt restrictions, also admits to excessive fluid intake and noncompliance with Lasix- PTA --Patient has been eating lots of [place] bacon, getting high salt soups from the daily, even taking only 40 of Lasix twice daily instead of 60 in the a.m. and 40 in the evening --Denies chest pains, no leg pains no pleuritic symptoms No fever  Or chills , no productive cough -No vomiting no diarrhea -Received Lasix IV 80 mg in the ED and voided about 700 mL  Review of systems:    In addition to the HPI above,   A full Review of  Systems was done, all other systems reviewed are negative except as noted above in HPI , .    Social History:   Reviewed by me    Social History   Tobacco Use  . Smoking status: Former Smoker    Packs/day: 1.00    Years: 40.00    Pack years: 40.00    Types: Cigarettes    Start date: 02/20/1959    Quit date: 09/13/2016    Years since quitting: 3.9  . Smokeless tobacco: Never Used  . Tobacco comment: 10/7 2-8 cigarettes per day. cutting back- AJ  Substance Use Topics  . Alcohol use: No    Alcohol/week: 0.0 standard drinks    Comment: "No, not really"    Family History :  Reviewed by me    Family History  Problem Relation Age of Onset  . Heart attack Mother   . Heart attack Brother     Home Medications:   Prior to Admission medications   Medication Sig Start Date End Date Taking? Authorizing Provider  allopurinol (ZYLOPRIM) 100 MG tablet Take 100 mg by mouth daily.  05/03/19  Yes [provider]  amLODipine (NORVASC) 10 MG tablet Take 1 tablet (10 mg total) by mouth daily. 05/18/19  Yes Emokpae, Courage, MD  atorvastatin (LIPITOR) 80 MG tablet Take 80 mg by mouth daily.   Yes [provider]  carvedilol (COREG) 3.125 MG tablet Take 1 tablet (3.125 mg total) by mouth 2 (two) times daily. 05/10/20 05/10/21 Yes Debbe Odea, MD  clopidogrel (PLAVIX) 75 MG tablet Take 75 mg by mouth daily.   Yes [provider]  famotidine (PEPCID) 40 MG tablet Take 40 mg by mouth daily.   Yes [provider]  finasteride (PROSCAR) 5 MG tablet Take 5 mg by mouth daily.   Yes [provider]  folic acid (FOLVITE) 1 MG tablet Take 1 mg by mouth daily.   Yes [provider]  furosemide (LASIX) 40 MG tablet Take 1-1.5 tablets (40-60 mg total) by mouth See admin instructions. Take 1.5 tablets (60 mg totally) by mouth in the morning; take 1 tablet (40 mg totally) by mouth in the evening 05/10/20  Yes Rizwan, Eunice Blase, MD  hydrALAZINE (APRESOLINE) 100 MG tablet Take 1 tablet (100 mg total) by mouth 3 (three) times daily. 05/18/19  Yes Roxan Hockey, MD  isosorbide  mononitrate (IMDUR) 120 MG 24 hr tablet Take 1 tablet (120 mg total) by mouth daily. Patient taking differently: Take 240 mg by mouth daily. 05/18/19  Yes Roxan Hockey, MD  KLOR-CON M20 20  MEQ tablet Take 20 mEq by mouth daily. Take one tablet Monday Wednesday and Friday 03/27/20  Yes [provider]  levothyroxine (SYNTHROID, LEVOTHROID) 50 MCG tablet Take 50 mcg by mouth daily before breakfast.   Yes [provider]  nitroGLYCERIN (NITROSTAT) 0.4 MG SL tablet Place 0.4 mg under the tongue every 5 (five) minutes as needed for chest pain (max 3 doses).   Yes [provider]  Omega-3 Fatty Acids (RA FISH OIL) 1000 MG CAPS Take 2,000 mg by mouth 2 (two) times daily.   Yes [provider]  omeprazole (PRILOSEC) 20 MG capsule Take 20 mg by mouth 2 (two) times daily. 02/14/20  Yes [provider]  tamsulosin (FLOMAX) 0.4 MG CAPS capsule Take 0.4 mg by mouth daily.   Yes [provider]  Vitamin D, Cholecalciferol, 25 MCG (1000 UT) CAPS Take 1,000 Units by mouth daily.   Yes [provider]  acetaminophen (TYLENOL) 325 MG tablet Take 2 tablets (650 mg total) by mouth every 6 (six) hours as needed for mild pain. 09/23/16   Nani Skillern, PA-C  epoetin alfa (EPOGEN) 20000 UNIT/ML injection Inject 20,000 Units into the skin every Monday. Patient not taking: Reported on 08/08/2020    [provider]  oxyCODONE-acetaminophen (PERCOCET) 5-325 MG tablet Take 1 tablet by mouth every 6 (six) hours as needed for severe pain. Patient not taking: Reported on 08/08/2020 02/11/20   Gabriel Earing, PA-C    Allergies:     Allergies  Allergen Reactions  . Cardizem Cd [Diltiazem Hcl Er Beads] Palpitations    "Makes heart skip"     Physical Exam:   Vitals  Blood pressure (!) 139/42, pulse 61, resp. rate 17, height 5\' 9"  (1.753 m), weight 86.2 kg, SpO2 95 %.  Physical Examination: General appearance - alert  with conversational  dyspnea Mental status - alert, oriented to person, place, and time,  Nose- [place] 2L/min Eyes - sclera anicteric Neck - supple, no JVD elevation , Chest -diminished breath sounds with bibasilar rales Heart - S1 and S2 normal, regular, prior CABG scar Abdomen - soft, nontender, nondistended, no masses or organomegaly Neurological - screening mental status exam normal, neck supple without rigidity, cranial nerves II through XII intact, DTR's normal and symmetric Extremities -2+ Pitting pedal edema noted, intact peripheral pulses  Skin - warm, dry    Data Review:    CBC Recent Labs  Lab 08/03/20 1405 08/08/20 0810  WBC  --  4.8  HGB 7.9* 8.6*  HCT  --  27.6*  PLT  --  154  MCV  --  90.8  MCH  --  28.3  MCHC  --  31.2  RDW  --  17.8*  LYMPHSABS  --  0.9  MONOABS  --  0.4  EOSABS  --  0.1  BASOSABS  --  0.0   ------------------------------------------------------------------------------------------------------------------  Chemistries  Recent Labs  Lab 08/08/20 0810  NA 140  K 3.3*  CL 106  CO2 22  GLUCOSE 129*  BUN 98*  CREATININE 3.72*  CALCIUM 8.1*  AST 21  ALT 17  ALKPHOS 75  BILITOT 0.7   ------------------------------------------------------------------------------------------------------------------ estimated creatinine clearance is 18.9 mL/min (A) (by C-G formula based on SCr of 3.72 mg/dL (H)). ------------------------------------------------------------------------------------------------------------------ No results for input(s): TSH, T4TOTAL, T3FREE, THYROIDAB in the last 72 hours.  Invalid input(s): FREET3   Coagulation profile No results for input(s): INR, PROTIME in the last 168 hours. ------------------------------------------------------------------------------------------------------------------- No results for input(s): DDIMER in the  last 72  hours. -------------------------------------------------------------------------------------------------------------------  Cardiac Enzymes No results for input(s): CKMB, TROPONINI, MYOGLOBIN in the last 168 hours.  Invalid input(s): CK ------------------------------------------------------------------------------------------------------------------    Component Value Date/Time   BNP 2,081.0 (H) 08/08/2020 0810     ---------------------------------------------------------------------------------------------------------------  Urinalysis    Component Value Date/Time   COLORURINE YELLOW 02/09/2020 0958   APPEARANCEUR CLEAR 02/09/2020 0958   LABSPEC 1.012 02/09/2020 0958   PHURINE 5.0 02/09/2020 0958   GLUCOSEU NEGATIVE 02/09/2020 0958   HGBUR NEGATIVE 02/09/2020 0958   BILIRUBINUR NEGATIVE 02/09/2020 [place] 02/09/2020 0958   PROTEINUR 100 (A) 02/09/2020 0958   NITRITE NEGATIVE 02/09/2020 0958   LEUKOCYTESUR NEGATIVE 02/09/2020 0958    ----------------------------------------------------------------------------------------------------------------   Imaging Results:    DG Chest Port 1 View  Result Date: 08/08/2020 CLINICAL DATA:  Shortness of breath that started last night EXAM: PORTABLE CHEST 1 VIEW COMPARISON:  05/08/2020 FINDINGS: Normal heart size and mediastinal contours. Diffuse interstitial prominence similar to prior. Kerley lines are noted. No visible effusion or air leak. Artifact from EKG leads. IMPRESSION: Interstitial opacity favoring edema. Electronically Signed   By: Monte Fantasia M.D.   On: 08/08/2020 08:42    Radiological Exams on Admission: DG Chest Port 1 View  Result Date: 08/08/2020 CLINICAL DATA:  Shortness of breath that started last night EXAM: PORTABLE CHEST 1 VIEW COMPARISON:  05/08/2020 FINDINGS: Normal heart size and mediastinal contours. Diffuse interstitial prominence similar to prior. Kerley lines are noted. No visible  effusion or air leak. Artifact from EKG leads. IMPRESSION: Interstitial opacity favoring edema. Electronically Signed   By: Monte Fantasia M.D.   On: 08/08/2020 08:42    DVT Prophylaxis -SCD /heparin AM Labs Ordered, also please review Full Orders  Family Communication: Admission, patients condition and plan of care including tests being ordered have been discussed with the patient and wife who indicate understanding and agree with the plan   Code Status - Full Code  Likely DC to  Home  Condition   stable  Roxan Hockey M.D on 08/08/2020 at 2:47 PM Go to [email or web address] -  for contact info  [facility] - Office  [phone]

## 2020-08-08 NOTE — Clinical Documentation Improvement (Signed)
[place] notification done [phone]

## 2020-08-08 NOTE — ED Triage Notes (Signed)
Pt c/o shob that started late lastnight. Pt has history of CHF.

## 2020-08-09 DIAGNOSIS — I2510 Atherosclerotic heart disease of native coronary artery without angina pectoris: Secondary | ICD-10-CM

## 2020-08-09 LAB — BASIC METABOLIC PANEL
Anion gap: 11 (ref 5–15)
BUN: 91 mg/dL — ABNORMAL HIGH (ref 8–23)
CO2: 23 mmol/L (ref 22–32)
Calcium: 8.8 mg/dL — ABNORMAL LOW (ref 8.9–10.3)
Chloride: 104 mmol/L (ref 98–111)
Creatinine, Ser: 3.52 mg/dL — ABNORMAL HIGH (ref 0.61–1.24)
GFR, Estimated: 17 mL/min — ABNORMAL LOW (ref >60)
Glucose, Bld: 114 mg/dL — ABNORMAL HIGH (ref 70–99)
Potassium: 3.4 mmol/L — ABNORMAL LOW (ref 3.5–5.1)
Sodium: 138 mmol/L (ref 135–145)

## 2020-08-09 LAB — CBC
HCT: 30 % — ABNORMAL LOW (ref 39.0–52.0)
Hemoglobin: 9.1 g/dL — ABNORMAL LOW (ref 13.0–17.0)
MCH: 27.7 pg (ref 26.0–34.0)
MCHC: 30.3 g/dL (ref 30.0–36.0)
MCV: 91.5 fL (ref 80.0–100.0)
Platelets: 180 10*3/uL (ref 150–400)
RBC: 3.28 MIL/uL — ABNORMAL LOW (ref 4.22–5.81)
RDW: 17.7 % — ABNORMAL HIGH (ref 11.5–15.5)
WBC: 6.9 10*3/uL (ref 4.0–10.5)
nRBC: 0 % (ref 0.0–0.2)

## 2020-08-09 LAB — MAGNESIUM: Magnesium: 1.5 mg/dL — ABNORMAL LOW (ref 1.7–2.4)

## 2020-08-09 LAB — MRSA PCR SCREENING: MRSA by PCR: NEGATIVE

## 2020-08-09 MED ORDER — FUROSEMIDE 10 MG/ML IJ SOLN
60.0000 mg | Freq: Two times a day (BID) | INTRAMUSCULAR | Status: DC
  Administered 2020-08-09 – 2020-08-11 (×4): 60 mg via INTRAVENOUS
  Filled 2020-08-09 (×4): qty 6

## 2020-08-09 MED ORDER — CHLORHEXIDINE GLUCONATE CLOTH 2 % EX PADS
6.0000 | MEDICATED_PAD | Freq: Every day | CUTANEOUS | Status: DC
  Administered 2020-08-09 – 2020-08-10 (×2): 6 via TOPICAL

## 2020-08-09 MED ORDER — MAGNESIUM SULFATE 2 GM/50ML IV SOLN
2.0000 g | Freq: Once | INTRAVENOUS | Status: AC
  Administered 2020-08-09: 2 g via INTRAVENOUS
  Filled 2020-08-09: qty 50

## 2020-08-09 MED ORDER — POTASSIUM CHLORIDE CRYS ER 20 MEQ PO TBCR
40.0000 meq | EXTENDED_RELEASE_TABLET | Freq: Once | ORAL | Status: AC
  Administered 2020-08-09: 40 meq via ORAL
  Filled 2020-08-09: qty 2

## 2020-08-09 NOTE — Progress Notes (Signed)
Patient Demographics:    [name], is a 75 y.o. male, DOB - December 17, 1945, VZD:[id number]  Admit date - 08/08/2020   Admitting Physician Courage Denton Brick, MD  Outpatient Primary MD for the patient is [facility]  LOS - 1   Chief Complaint  Patient presents with  . Shortness of Breath        Subjective:    [name] today has no fevers, no emesis,  No chest pain,  -Cough and shortness of breath improved -Hypoxia addressed mostly resolved,  -patient does have DOE and hypoxia with activity   Assessment  & Plan :    Principal Problem:   Acute exacerbation of Diastolic CHF (congestive heart failure) (HCC) Active Problems:   (HFpEF) heart failure with preserved ejection fraction (HCC)   AKI (acute kidney injury) on CKD V   COPD without exacerbation (HCC)---40 Pack Years   Essential hypertension   NSTEMI (non-ST elevated myocardial infarction) ([place])   Obstructive sleep apnea   Anemia in chronic kidney disease   Coronary artery disease involving native coronary artery of native heart without angina pectoris  Brief Summary:- 75 y.o. male with past medical history relevant for CAD(s/p prior CABG), COPD, HTN, BPHand CKD Vas well as chronic anemia of CKDadmitted on 08/08/2020 with acute on chronic diastolic dysfunction CHF with acute hypoxic respiratory failure  A/p 1)HFpEF--patient with h/o chronic diastolic dysfunction CHF--presenting with acute on chronic diastolic CHF exacerbation   --Echo from 05/09/2020 revealedecho with EF down to 55 to 60% fromprior EF of60 to 65% with grade 1diastolic dysfunction -pt admits to non-compliance with dietary/salt restrictions, also admits to excessive fluid intake and noncompliance with Lasix- PTA -Patient is diuresing very well with about 3200 mL of urine output since admission -Change IV Lasix to 60 mg every 12 hours -Monitor electrolytes  closely with CKD stage V -BNP is  2,081--- which is higher than patient's prior baseline  -chest x-ray and clinical exam is consistent with CHF -Repeat chest x-ray and BNP in a.m. -Dyspnea at rest resolved -Dyspnea on exertion improving -Hypoxia at rest resolved continues with some hypoxia with activity -  2)Elevated Troponin--serial EKG with sinus rhythm, QTC borderline elevated, borderline repolarization normalities without frank ischemic/ST segment changes -Troponin 279>> 323>>300 -Remains chest pain-free -  3)CKD V ---  --Patient's primary nephrologist is Dr. Theador Hawthorne in [place] -Creatinine was3.72 on admission- which is close to patient's recent baseline  -Pt is s/prightbrachiocephalic arteriovenous fistulaby Dr. Scot Dock 9/28/21in anticipation of hemodialysis initiation in the near future -PTH and vitamin Dwere checked recently -renally adjust medications, avoid nephrotoxic agents / dehydration / hypotension -Creatinine is down to 3.52 from 3.72 on admission-IV Lasix as above #1 -Monitorelectrolytes, fluidinput/output and daily weight - Nephrology consult requested -Patient has 10.6 cm Lt renal cyst (not new)  4)Chronic normocytic and normochromic Anemia of CKD--- -Hemoglobin is 9.1which is close to patient's recent baseline, Procrit/ESA as pernephrologist -No bleeding concerns  5)CAD-prior CABG in 2008,LHC in 2015 with angioplasty and stenting--- -Troponin is elevated as above #2in the setting of CKD V and CHF exac ----patient is chest pain-free currently  -ContinueLipitor, Plavix and isosorbide  6)HTN--continue isosorbide/hydralazine combo, amlodipine 10 mg daily, and Coreg 3.25 mg daily  7)BPH--stable, continue Flomax and finasteride  8)Hypothyroidism---stable, continue  Levothyroxine 50 mcg daily -TSH is 2.1  9)10.6 CM-left renal cyst-(not new)--  outpatient imaging surveillance/follow-up  10)H/o Fatty Liver and  cholelithiasis----asymptomatic,  --continue Lipitor for CAD , watch LFTs closely  11)COPD--reformed smoker,no acute exacerbation at this time -Bronchodilators ordered  12) acute hypoxic respiratory failure--- due to #1 above -Dyspnea at rest resolved -Dyspnea on exertion improving -Hypoxia at rest resolved continues with some hypoxia with activity -Continue oxygen supplementation anticipate improvement with diuresis  13) hyponatremia and hypomagnesemia--- in the setting of IV diuresis, replace and recheck  Disposition/Need for in-Hospital Stay- patient unable to be discharged at this time due to--acute hypoxic respiratory failure secondary to acute diastolic CHF exacerbation requiring IV diuresis and frequent monitoring of electrolytes and renal parameters  Dispo: The patient is from:Home Anticipated d/c is ZO:XWRU Anticipated d/c date is: 2 days  Patient currently is not medically stable to d/c. Barriers: Not Clinically Stable  Code Status :  -  Code Status: Full Code   Family Communication:    (patient is alert, awake and coherent)  Discussed with wife   Consults  : Nephrology consult requested  DVT Prophylaxis  :   - SCDs   Place TED hose Start: 08/09/20 1021 heparin injection 5,000 Units Start: 08/08/20 1800 SCDs Start: 08/08/20 1441 Place TED hose Start: 08/08/20 1441  Lab Results  Component Value Date   PLT 180 08/09/2020    Inpatient Medications  Scheduled Meds: . amLODipine  10 mg Oral Daily  . atorvastatin  80 mg Oral Daily  . carvedilol  3.125 mg Oral BID WC  . Chlorhexidine Gluconate Cloth  6 each Topical Daily  . cholecalciferol  1,000 Units Oral Daily  . clopidogrel  75 mg Oral Daily  . finasteride  5 mg Oral Daily  . folic acid  1 mg Oral Daily  . furosemide  60 mg Intravenous Q8H  . heparin  5,000 Units Subcutaneous Q8H  . hydrALAZINE  100 mg Oral TID  . isosorbide mononitrate  120 mg Oral Daily  .  levothyroxine  50 mcg Oral QAC breakfast  . omega-3 acid ethyl esters  2,000 mg Oral BID  . pantoprazole  40 mg Oral Daily  . potassium chloride  20 mEq Oral Daily  . potassium chloride  40 mEq Oral ONCE-1600  . sodium chloride flush  3 mL Intravenous Q12H  . sodium chloride flush  3 mL Intravenous Q12H  . tamsulosin  0.4 mg Oral Daily   Continuous Infusions: . sodium chloride     PRN Meds:.sodium chloride, acetaminophen **OR** acetaminophen, albuterol, bisacodyl, ondansetron **OR** ondansetron (ZOFRAN) IV, polyethylene glycol, sodium chloride flush, traZODone    Anti-infectives (From admission, onward)   None        Objective:   Vitals:   08/09/20 0900 08/09/20 1000 08/09/20 1100 08/09/20 1200  BP: (!) 150/36 (!) 146/42 (!) 132/36 (!) 125/95  Pulse: 81 83 67 70  Resp: (!) 24 (!) 27 15 (!) 21  Temp:      TempSrc:      SpO2: 91% 93% 93% 91%  Weight:      Height:        Wt Readings from Last 3 Encounters:  08/08/20 84.7 kg  07/24/20 80 kg  06/15/20 88.5 kg    Intake/Output Summary (Last 24 hours) at 08/09/2020 1246 Last data filed at 08/09/2020 0900 Gross per 24 hour  Intake 350 ml  Output 3200 ml  Net -2850 ml    Physical Exam  Gen:- Awake Alert,  able to speak in short sentences HEENT:- [place].AT, No sclera icterus Neck-Supple Neck, +ve JVD,.  Lungs-diminished breath sounds with bibasilar rales  CV- S1, S2 normal, regular , prior CABG scar Abd-  +ve B.Sounds, Abd Soft, No tenderness,    Extremity/Skin:-Improving pitting edema, pedal pulses present  Psych-affect is appropriate, oriented x3 Neuro-generalized weakness, no new focal deficits, no tremors MSK-right upper extremity AV fistula with positive bruit and thrill   Data Review:   Micro Results Recent Results (from the past 240 hour(s))  Resp Panel by RT-PCR (Flu A&B, Covid) Nasopharyngeal Swab     Status: None   Collection Time: 08/08/20  8:17 AM   Specimen: Nasopharyngeal Swab; Nasopharyngeal(NP) swabs  in vial transport medium  Result Value Ref Range Status   SARS Coronavirus 2 by RT PCR NEGATIVE NEGATIVE Final    Comment: (NOTE) SARS-CoV-2 target nucleic acids are NOT DETECTED.  The SARS-CoV-2 RNA is generally detectable in upper respiratory specimens during the acute phase of infection. The lowest concentration of SARS-CoV-2 viral copies this assay can detect is 138 copies/mL. A negative result does not preclude SARS-Cov-2 infection and should not be used as the sole basis for treatment or other patient management decisions. A negative result may occur with  improper specimen collection/handling, submission of specimen other than nasopharyngeal swab, presence of viral mutation(s) within the areas targeted by this assay, and inadequate number of viral copies(<138 copies/mL). A negative result must be combined with clinical observations, patient history, and epidemiological information. The expected result is Negative.  Fact Sheet for Patients:  EntrepreneurPulse.com.au  Fact Sheet for Healthcare Providers:  IncredibleEmployment.be  This test is no t yet approved or cleared by the [place] FDA and  has been authorized for detection and/or diagnosis of SARS-CoV-2 by FDA under an Emergency Use Authorization (EUA). This EUA will remain  in effect (meaning this test can be used) for the duration of the COVID-19 declaration under Section 564(b)(1) of the Act, 21 U.S.C.section 360bbb-3(b)(1), unless the authorization is terminated  or revoked sooner.       Influenza A by PCR NEGATIVE NEGATIVE Final   Influenza B by PCR NEGATIVE NEGATIVE Final    Comment: (NOTE) The Xpert Xpress SARS-CoV-2/FLU/RSV plus assay is intended as an aid in the diagnosis of influenza from Nasopharyngeal swab specimens and should not be used as a sole basis for treatment. Nasal washings and aspirates are unacceptable for Xpert Xpress  SARS-CoV-2/FLU/RSV testing.  Fact Sheet for Patients: EntrepreneurPulse.com.au  Fact Sheet for Healthcare Providers: IncredibleEmployment.be  This test is not yet approved or cleared by the [place] FDA and has been authorized for detection and/or diagnosis of SARS-CoV-2 by FDA under an Emergency Use Authorization (EUA). This EUA will remain in effect (meaning this test can be used) for the duration of the COVID-19 declaration under Section 564(b)(1) of the Act, 21 U.S.C. section 360bbb-3(b)(1), unless the authorization is terminated or revoked.  Performed at [facility], [address], [place], [place] [address]   MRSA PCR Screening     Status: None   Collection Time: 08/09/20  4:00 AM   Specimen: Nasal Mucosa; Nasopharyngeal  Result Value Ref Range Status   MRSA by PCR NEGATIVE NEGATIVE Final    Comment:        The GeneXpert MRSA Assay (FDA approved for NASAL specimens only), is one component of a comprehensive MRSA colonization surveillance program. It is not intended to diagnose MRSA infection nor to guide or monitor treatment for MRSA infections. Performed  at [facility], [address], [place],  [address]     Radiology Reports DG Chest [place] 1 View  Result Date: 08/08/2020 CLINICAL DATA:  Shortness of breath that started last night EXAM: PORTABLE CHEST 1 VIEW COMPARISON:  05/08/2020 FINDINGS: Normal heart size and mediastinal contours. Diffuse interstitial prominence similar to prior. Kerley lines are noted. No visible effusion or air leak. Artifact from EKG leads. IMPRESSION: Interstitial opacity favoring edema. Electronically Signed   By: Monte Fantasia M.D.   On: 08/08/2020 08:42     CBC Recent Labs  Lab 08/03/20 1405 08/08/20 0810 08/09/20 0422  WBC  --  4.8 6.9  HGB 7.9* 8.6* 9.1*  HCT  --  27.6* 30.0*  PLT  --  154 180  MCV  --  90.8 91.5  MCH  --  28.3 27.7  MCHC  --  31.2 30.3  RDW  --  17.8* 17.7*   LYMPHSABS  --  0.9  --   MONOABS  --  0.4  --   EOSABS  --  0.1  --   BASOSABS  --  0.0  --    Chemistries  Recent Labs  Lab 08/08/20 0810 08/09/20 0422  NA 140 138  K 3.3* 3.4*  CL 106 104  CO2 22 23  GLUCOSE 129* 114*  BUN 98* 91*  CREATININE 3.72* 3.52*  CALCIUM 8.1* 8.8*  MG  --  1.5*  AST 21  --   ALT 17  --   ALKPHOS 75  --   BILITOT 0.7  --    ------------------------------------------------------------------------------------------------------------------ No results for input(s): CHOL, HDL, LDLCALC, TRIG, CHOLHDL, LDLDIRECT in the last 72 hours.  Lab Results  Component Value Date   HGBA1C 5.6 09/19/2019   ------------------------------------------------------------------------------------------------------------------ Recent Labs    08/08/20 1900  TSH 2.122   ------------------------------------------------------------------------------------------------------------------ No results for input(s): VITAMINB12, FOLATE, FERRITIN, TIBC, IRON, RETICCTPCT in the last 72 hours.  Coagulation profile No results for input(s): INR, PROTIME in the last 168 hours.  No results for input(s): DDIMER in the last 72 hours.  Cardiac Enzymes No results for input(s): CKMB, TROPONINI, MYOGLOBIN in the last 168 hours.  Invalid input(s): CK ------------------------------------------------------------------------------------------------------------------    Component Value Date/Time   BNP 2,081.0 (H) 08/08/2020 2297     Roxan Hockey M.D on 08/09/2020 at 12:46 PM  Go to [email or web address] - for contact info  [facility] - Office  [phone]

## 2020-08-09 NOTE — TOC Initial Note (Deleted)
Transition of Care [facility]) - Initial/Assessment Note    Patient Details  Name: [name] MRN: [id number] Date of Birth: 07/17/1945  [facility]) CM/SW Contact:    Natasha Bence, LCSW Phone Number: 08/09/2020, 1:57 PM  Clinical Narrative:                 Patient is a 75 year old male admitted for Acute exacerbation of Diastolic CHF (congestive heart failure) ([place]). CSW observed patient's high readmission risk score. CSW conducted readmission risk assessment. PCP not available on weekend. PCP appt will be scheduled during the weekday. CSW also conducted initial assessment. Patient's wife reported that patient is amblatory at baseline with out assistance and is able to complete ADL's independently. Patient does not have Hx with HH or SNF but is agreeable if recommended. CSW also received CHF consult. Patient's wife reported that patient's CHF diagnosis is not a new diagnosis. Patient's wife reported that MD has reviewed CHF recommendations with the patient such as weighing himself daily, following a heart healthy diet, and monitoring fluid intake. Patient's wife reported that patient is working to adhere to recommendations. TOC to follow.   Expected Discharge Plan: Home/Self Care Barriers to Discharge: Continued Medical Work up   Patient Goals and CMS Choice Patient states their goals for this hospitalization and ongoing recovery are:: Return home CMS Medicare.gov Compare Post Acute Care list provided to:: Patient Choice offered to / list presented to : [facility]  Expected Discharge Plan and Services Expected Discharge Plan: Home/Self Care In-house Referral: NA Discharge Planning Services: NA Post Acute Care Choice: NA Living arrangements for the past 2 months: Single Family Home                 DME Arranged: N/A DME Agency: NA       HH Arranged: NA HH Agency: NA        Prior Living Arrangements/Services Living arrangements for the past 2 months: Single  Family Home Lives with:: Self,Spouse Patient language and need for interpreter reviewed:: Yes Do you feel safe going back to the place where you live?: Yes      Need for Family Participation in Patient Care: Yes (Comment) Care giver support system in place?: Yes (comment)   Criminal Activity/Legal Involvement Pertinent to Current Situation/Hospitalization: No - Comment as needed  Activities of Daily Living Home Assistive Devices/Equipment: None,Hearing aid,Dentures (specify type) ADL Screening (condition at time of admission) Patient's cognitive ability adequate to safely complete daily activities?: Yes Is the patient deaf or have difficulty hearing?: Yes Does the patient have difficulty seeing, even when wearing glasses/contacts?: No Does the patient have difficulty concentrating, remembering, or making decisions?: No Patient able to express need for assistance with ADLs?: Yes Does the patient have difficulty dressing or bathing?: No Independently performs ADLs?: Yes (appropriate for developmental age) Does the patient have difficulty walking or climbing stairs?: No Weakness of Legs: None Weakness of Arms/Hands: None  Permission Sought/Granted Permission sought to share information with : Chartered certified accountant granted to share information with : Yes, Verbal Permission Granted  Share Information with NAME: Badour,Lynne R  Permission granted to share info w AGENCY: SNF or [place]  Permission granted to share info w Relationship: (Spouse)  Permission granted to share info w Contact Information: [phone]  Emotional Assessment       Orientation: : Oriented to Self,Oriented to Place,Oriented to  Time,Oriented to Situation Alcohol / Substance Use: Not Applicable Psych Involvement: No (comment)  Admission diagnosis:  Acute exacerbation of CHF (congestive heart failure) (HCC) [H88.5] Systolic congestive heart failure, unspecified HF chronicity ([place])  [I50.20] Patient Active Problem List   Diagnosis Date Noted  . Acute diastolic CHF (congestive heart failure) ([place]) 05/09/2020  . Acute exacerbation of Diastolic CHF (congestive heart failure) ([place]) 05/08/2020  . Symptomatic bradycardia with heart failure 05/08/2020  . COPD without exacerbation (HCC)---40 Pack Years 05/08/2020  . Acute respiratory failure with hypoxia ([place]) 05/08/2020  . Uremia in the setting of AKI on CKD V 02/07/2020  . CKD (chronic kidney disease), stage V ([place]) 02/07/2020  . Coronary artery disease involving native coronary artery of native heart without angina pectoris 12/16/2019  . 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 Gastroenteritis 12/16/2019  . History of bladder cancer 12/16/2019  . Hyperkalemia 12/16/2019  . (HFpEF) heart failure with preserved ejection fraction ([place]) 05/17/2019  . AKI (acute kidney injury) on CKD V 05/16/2019  . Proteinuria 03/28/2019  . Acute blood loss anemia 06/08/2018  . Anemia in chronic kidney disease 06/08/2018  . BPH (benign prostatic hyperplasia) 06/08/2018  . Coronary artery disease with unstable angina pectoris ([place]) 06/08/2018  . Elevated troponin I level 06/08/2018  . GIB (gastrointestinal bleeding) 06/07/2018  . Coronary artery disease involving autologous artery coronary bypass graft 06/29/2017  . 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 NSTEMI (non-ST elevated myocardial infarction) ([place]) 09/14/2016  . Chest pain 09/14/2016  . Obstructive sleep apnea 09/14/2016  . Acute myocardial infarction of other inferior wall, initial episode of care   . Unstable angina ([place]) 01/30/2014  . OVERWEIGHT/OBESITY 02/02/2010  . HYPERLIPIDEMIA, MIXED 01/22/2009  . OTHER CHEST PAIN 01/22/2009  . TOBACCO USER 01/17/2009  . Essential hypertension 01/17/2009   PCP:  Clinic, [place]:   [id number] [place], [place] [address] [place] [address] Phone:  [phone] Fax: [place], [place] - [address] [place] [address] Phone: [phone] Fax: [phone]  [name] [place], [place] - [address] [place] [address] Phone: [phone] Fax: [phone]     Social Determinants of Health (SDOH) Interventions    Readmission Risk Interventions No flowsheet data found.

## 2020-08-09 NOTE — TOC Initial Note (Signed)
Transition of Care [facility]) - Initial/Assessment Note    Patient Details  Name: [name] MRN: [id number] Date of Birth: [birth year]  Transition of Care [facility]) CM/SW Contact:    Natasha Bence, LCSW Phone Number: 08/09/2020, 2:06 PM  Clinical Narrative:                 Patient is a 75 year old male admitted for Acute exacerbation of Diastolic CHF (congestive heart failure) ([place]). CSW observed patient's high readmission risk score. CSW conducted readmission risk assessment. PCP not available on weekend. PCP appt will be scheduled during the weekday. CSW also conducted initial assessment. Patient's wife reported that patient is amblatory at baseline with out assistance and is able to complete ADL's independently. Patient does not have Hx with HH or SNF but is agreeable if recommended. CSW also received CHF consult. Patient's wife reported that patient's CHF diagnosis is not a new diagnosis. Patient's wife reported that MD has reviewed CHF recommendations with the patient such as weighing himself daily, following a heart healthy diet, and monitoring fluid intake. Patient's wife reported that patient is working to adhere to recommendations. TOC to follow.   Expected Discharge Plan: Home/Self Care Barriers to Discharge: Continued Medical Work up   Patient Goals and CMS Choice Patient states their goals for this hospitalization and ongoing recovery are:: Return home CMS Medicare.gov Compare Post Acute Care list provided to:: Patient Choice offered to / list presented to : [facility]  Expected Discharge Plan and Services Expected Discharge Plan: Home/Self Care In-house Referral: NA Discharge Planning Services: NA Post Acute Care Choice: NA Living arrangements for the past 2 months: Single Family Home                 DME Arranged: N/A DME Agency: NA       HH Arranged: NA HH Agency: NA        Prior Living Arrangements/Services Living arrangements for the past 2 months: Single  Family Home Lives with:: Self,Spouse Patient language and need for interpreter reviewed:: Yes Do you feel safe going back to the place where you live?: Yes      Need for Family Participation in Patient Care: Yes (Comment) Care giver support system in place?: Yes (comment)   Criminal Activity/Legal Involvement Pertinent to Current Situation/Hospitalization: No - Comment as needed  Activities of Daily Living Home Assistive Devices/Equipment: None,Hearing aid,Dentures (specify type) ADL Screening (condition at time of admission) Patient's cognitive ability adequate to safely complete daily activities?: Yes Is the patient deaf or have difficulty hearing?: Yes Does the patient have difficulty seeing, even when wearing glasses/contacts?: No Does the patient have difficulty concentrating, remembering, or making decisions?: No Patient able to express need for assistance with ADLs?: Yes Does the patient have difficulty dressing or bathing?: No Independently performs ADLs?: Yes (appropriate for developmental age) Does the patient have difficulty walking or climbing stairs?: No Weakness of Legs: None Weakness of Arms/Hands: None  Permission Sought/Granted Permission sought to share information with : Chartered certified accountant granted to share information with : Yes, Verbal Permission Granted  Share Information with NAME: Shimmel,Lynne R  Permission granted to share info w AGENCY: SNF or [place]  Permission granted to share info w Relationship: (Spouse)  Permission granted to share info w Contact Information: [phone]  Emotional Assessment       Orientation: : Oriented to Self,Oriented to Place,Oriented to  Time,Oriented to Situation Alcohol / Substance Use: Not Applicable Psych Involvement: No (comment)  Admission diagnosis:  Acute exacerbation of CHF (congestive heart failure) (HCC) [U13.2] Systolic congestive heart failure, unspecified HF chronicity ([place])  [I50.20] Patient Active Problem List   Diagnosis Date Noted  . Acute diastolic CHF (congestive heart failure) ([place]) 05/09/2020  . Acute exacerbation of Diastolic CHF (congestive heart failure) ([place]) 05/08/2020  . Symptomatic bradycardia with heart failure 05/08/2020  . COPD without exacerbation (HCC)---40 Pack Years 05/08/2020  . Acute respiratory failure with hypoxia ([place]) 05/08/2020  . Uremia in the setting of AKI on CKD V 02/07/2020  . CKD (chronic kidney disease), stage V ([place]) 02/07/2020  . Coronary artery disease involving native coronary artery of native heart without angina pectoris 12/16/2019  . 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 Gastroenteritis 12/16/2019  . History of bladder cancer 12/16/2019  . Hyperkalemia 12/16/2019  . (HFpEF) heart failure with preserved ejection fraction ([place]) 05/17/2019  . AKI (acute kidney injury) on CKD V 05/16/2019  . Proteinuria 03/28/2019  . Acute blood loss anemia 06/08/2018  . Anemia in chronic kidney disease 06/08/2018  . BPH (benign prostatic hyperplasia) 06/08/2018  . Coronary artery disease with unstable angina pectoris ([place]) 06/08/2018  . Elevated troponin I level 06/08/2018  . GIB (gastrointestinal bleeding) 06/07/2018  . Coronary artery disease involving autologous artery coronary bypass graft 06/29/2017  . 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 NSTEMI (non-ST elevated myocardial infarction) ([place]) 09/14/2016  . Chest pain 09/14/2016  . Obstructive sleep apnea 09/14/2016  . Acute myocardial infarction of other inferior wall, initial episode of care   . Unstable angina ([place]) 01/30/2014  . OVERWEIGHT/OBESITY 02/02/2010  . HYPERLIPIDEMIA, MIXED 01/22/2009  . OTHER CHEST PAIN 01/22/2009  . TOBACCO USER 01/17/2009  . Essential hypertension 01/17/2009   PCP:  Clinic, [place]:   [id number] [place], [place] [address] [place] [address] Phone:  [phone] Fax: [place], [place] - [place] [address] [place] [address] Phone: [phone] Fax: [phone]  [name] [place], [place] [address] [place] [address] Phone: [phone] Fax: [phone]     Social Determinants of Health (SDOH) Interventions    Readmission Risk Interventions Readmission Risk Prevention Plan 08/09/2020  Transportation Screening Complete  Medication Review ([place]) Complete  HRI or [place] Complete  SW Recovery Care/Counseling Consult Complete  [place] Not Applicable  Some recent data might be hidden

## 2020-08-10 ENCOUNTER — Inpatient Hospital Stay (HOSPITAL_COMMUNITY): Payer: No Typology Code available for payment source

## 2020-08-10 DIAGNOSIS — I10 Essential (primary) hypertension: Secondary | ICD-10-CM

## 2020-08-10 LAB — BASIC METABOLIC PANEL
Anion gap: 13 (ref 5–15)
BUN: 91 mg/dL — ABNORMAL HIGH (ref 8–23)
CO2: 24 mmol/L (ref 22–32)
Calcium: 8.9 mg/dL (ref 8.9–10.3)
Chloride: 102 mmol/L (ref 98–111)
Creatinine, Ser: 3.92 mg/dL — ABNORMAL HIGH (ref 0.61–1.24)
GFR, Estimated: 15 mL/min — ABNORMAL LOW (ref >60)
Glucose, Bld: 93 mg/dL (ref 70–99)
Potassium: 3.9 mmol/L (ref 3.5–5.1)
Sodium: 139 mmol/L (ref 135–145)

## 2020-08-10 LAB — CBC
HCT: 27.9 % — ABNORMAL LOW (ref 39.0–52.0)
Hemoglobin: 8.5 g/dL — ABNORMAL LOW (ref 13.0–17.0)
MCH: 27.5 pg (ref 26.0–34.0)
MCHC: 30.5 g/dL (ref 30.0–36.0)
MCV: 90.3 fL (ref 80.0–100.0)
Platelets: 161 10*3/uL (ref 150–400)
RBC: 3.09 MIL/uL — ABNORMAL LOW (ref 4.22–5.81)
RDW: 17.8 % — ABNORMAL HIGH (ref 11.5–15.5)
WBC: 4.8 10*3/uL (ref 4.0–10.5)
nRBC: 0 % (ref 0.0–0.2)

## 2020-08-10 LAB — MAGNESIUM: Magnesium: 1.7 mg/dL (ref 1.7–2.4)

## 2020-08-10 LAB — BRAIN NATRIURETIC PEPTIDE: B Natriuretic Peptide: 2146 pg/mL — ABNORMAL HIGH (ref 0.0–100.0)

## 2020-08-10 LAB — TROPONIN I (HIGH SENSITIVITY): Troponin I (High Sensitivity): 246 ng/L (ref <18)

## 2020-08-10 MED ORDER — MAGNESIUM SULFATE 2 GM/50ML IV SOLN
2.0000 g | Freq: Once | INTRAVENOUS | Status: AC
  Administered 2020-08-10: 2 g via INTRAVENOUS
  Filled 2020-08-10: qty 50

## 2020-08-10 NOTE — Progress Notes (Signed)
Patient Demographics:    [name], is a 75 y.o. male, DOB - 12/19/1945, SMO:[id number]  Admit date - 08/08/2020   Admitting Physician Raiyan Dalesandro Denton Brick, MD  Outpatient Primary MD for the patient is [facility], [facility]  LOS - 2   Chief Complaint  Patient presents with  . Shortness of Breath        Subjective:    [name] today has no fevers, no emesis,  No chest pain,  -Dyspnea on exertion improving -No hypoxia at rest -Voiding okay   Assessment  & Plan :    Principal Problem:   Acute exacerbation of Diastolic CHF (congestive heart failure) (HCC) Active Problems:   (HFpEF) heart failure with preserved ejection fraction (HCC)   AKI (acute kidney injury) on CKD V   COPD without exacerbation (HCC)---40 Pack Years   Essential hypertension   NSTEMI (non-ST elevated myocardial infarction) ([place])   Obstructive sleep apnea   Anemia in chronic kidney disease   Coronary artery disease involving native coronary artery of native heart without angina pectoris  Brief Summary:- 75 y.o. male with past medical history relevant for CAD(s/p prior CABG), COPD, HTN, BPHand CKD Vas well as chronic anemia of CKDadmitted on 08/08/2020 with acute on chronic diastolic dysfunction CHF with acute hypoxic respiratory failure  A/p 1)HFpEF--patient with h/o chronic diastolic dysfunction CHF--presenting with acute on chronic diastolic CHF exacerbation   --Echo from 05/09/2020 revealedecho with EF down to 55 to 60% fromprior EF of60 to 65% with grade 1diastolic dysfunction -pt admits to non-compliance with dietary/salt restrictions, also admits to excessive fluid intake and noncompliance with Lasix- PTA -Patient is diuresing very well with about 3200 mL of urine output since admission -Monitor electrolytes closely with CKD stage V -Repeat chest x-ray on 08/10/20 suggest CHF with worsening bil effusions  -BNP  trending up-- now 2,146 -Dyspnea on exertion improving -c/n  IV Lasix to 60 mg every 12 hours -Input and output documentation appears to be inaccurate but overall fluid balance appears to be negative  2)Elevated Troponin--serial EKG with sinus rhythm, QTC borderline elevated, borderline repolarization normalities without frank ischemic/ST segment changes -Troponin 279>> 323>>300>>246 -Remains chest pain-free - 3)CKD V ---  --Patient's primary nephrologist is Dr. Theador Hawthorne in [place] -Creatinine was3.72 on admission- which is close to patient's recent baseline  -Pt is s/prightbrachiocephalic arteriovenous fistulaby Dr. Scot Dock 9/28/21in anticipation of hemodialysis initiation in the near future -PTH and vitamin Dwere checked recently -renally adjust medications, avoid nephrotoxic agents / dehydration / hypotension -Creatinine is 3.92  from 3.72 on admission-IV Lasix as above #1 -Monitorelectrolytes, fluidinput/output and daily weight -Patient has 10.6 cm Lt renal cyst (not new)  4)Chronic normocytic and normochromic Anemia of CKD--- -Hemoglobin is 9.1which is close to patient's recent baseline, Procrit/ESA as pernephrologist -No bleeding concerns  5)CAD-prior CABG in 2008,LHC in 2015 with angioplasty and stenting--- -Troponin is elevated as above #2in the setting of CKD V and CHF exac ----patient is chest pain-free currently  -ContinueLipitor, Plavix and isosorbide  6)HTN--continue isosorbide/hydralazine combo, amlodipine 10 mg daily, and Coreg 3.25 mg daily  7)BPH--stable, continue Flomax and finasteride  8)Hypothyroidism---stable, continue Levothyroxine 50 mcg daily -TSH is 2.1  9)10.6 CM-left renal cyst-(not new)--  outpatient imaging surveillance/follow-up  10)H/o Fatty Liver and cholelithiasis----asymptomatic,  --  continue Lipitor for CAD , watch LFTs closely  11)COPD--reformed smoker,no acute exacerbation at this  time -Bronchodilators ordered  12)Acute Hypoxic Respiratory Failure--- due to #1 above -Dyspnea at rest resolved -Dyspnea on exertion improving -Hypoxia at rest resolved continues with some hypoxia with activity -Continue oxygen supplementation anticipate improvement with diuresis  13)Hyponatremia and Hypomagnesemia--- in the setting of IV diuresis, replace and recheck  Disposition/Need for in-Hospital Stay- patient unable to be discharged at this time due to--acute hypoxic respiratory failure secondary to acute diastolic CHF exacerbation requiring IV diuresis and frequent monitoring of electrolytes and renal parameters --Repeat chest x-ray on 08/10/20 suggest CHF with worsening bil effusions  -BNP trending up-- now 2,146  Dispo: The patient is from:Home Anticipated d/c is WC:[facility] Anticipated d/c date is: 1 days  Patient currently is not medically stable to d/c. Barriers: Not Clinically Stable  Code Status :  -  Code Status: Full Code   Family Communication:    (patient is alert, awake and coherent)  Discussed with wife   Consults  : Nephrology consult requested  DVT Prophylaxis  :   - SCDs   Place TED hose Start: 08/09/20 1021 heparin injection 5,000 Units Start: 08/08/20 1800 SCDs Start: 08/08/20 1441 Place TED hose Start: 08/08/20 1441  Lab Results  Component Value Date   PLT 161 08/10/2020    Inpatient Medications  Scheduled Meds: . amLODipine  10 mg Oral Daily  . atorvastatin  80 mg Oral Daily  . carvedilol  3.125 mg Oral BID WC  . Chlorhexidine Gluconate Cloth  6 each Topical Daily  . cholecalciferol  1,000 Units Oral Daily  . clopidogrel  75 mg Oral Daily  . finasteride  5 mg Oral Daily  . folic acid  1 mg Oral Daily  . furosemide  60 mg Intravenous Q12H  . heparin  5,000 Units Subcutaneous Q8H  . hydrALAZINE  100 mg Oral TID  . isosorbide mononitrate  120 mg Oral Daily  . levothyroxine  50 mcg Oral QAC breakfast   . omega-3 acid ethyl esters  2,000 mg Oral BID  . pantoprazole  40 mg Oral Daily  . potassium chloride  20 mEq Oral Daily  . sodium chloride flush  3 mL Intravenous Q12H  . sodium chloride flush  3 mL Intravenous Q12H  . tamsulosin  0.4 mg Oral Daily   Continuous Infusions: . sodium chloride     PRN Meds:.sodium chloride, acetaminophen **OR** acetaminophen, albuterol, bisacodyl, ondansetron **OR** ondansetron (ZOFRAN) IV, polyethylene glycol, sodium chloride flush, traZODone    Anti-infectives (From admission, onward)   None        Objective:   Vitals:   08/09/20 2335 08/10/20 0527 08/10/20 0937 08/10/20 1348  BP: (!) 143/48 (!) 151/68 (!) 130/49 (!) 122/51  Pulse: 79 87 68 65  Resp:      Temp: 99.5 F (37.5 C) 97.6 F (36.4 C) 98.5 F (36.9 C) (!) 97.5 F (36.4 C)  TempSrc: Oral Oral Oral Oral  SpO2: 91% 94% 98% 97%  Weight:      Height:        Wt Readings from Last 3 Encounters:  08/08/20 84.7 kg  07/24/20 80 kg  06/15/20 88.5 kg    Intake/Output Summary (Last 24 hours) at 08/10/2020 1439 Last data filed at 08/10/2020 1300 Gross per 24 hour  Intake 483 ml  Output 800 ml  Net -317 ml    Physical Exam  Gen:- Awake Alert, conversational dyspnea has resolved HEENT:- [place].AT, No sclera  icterus Neck-Supple Neck, +ve JVD,.  Lungs-improving air movement, bibasilar rales CV- S1, S2 normal, regular , prior CABG scar Abd-  +ve B.Sounds, Abd Soft, No tenderness,    Extremity/Skin:-Improving pitting edema, pedal pulses present  Psych-affect is appropriate, oriented x3 Neuro-generalized weakness, no new focal deficits, no tremors MSK-right upper extremity AV fistula with positive bruit and thrill   Data Review:   Micro Results Recent Results (from the past 240 hour(s))  Resp Panel by RT-PCR (Flu A&B, Covid) Nasopharyngeal Swab     Status: None   Collection Time: 08/08/20  8:17 AM   Specimen: Nasopharyngeal Swab; Nasopharyngeal(NP) swabs in vial transport medium   Result Value Ref Range Status   SARS Coronavirus 2 by RT PCR NEGATIVE NEGATIVE Final    Comment: (NOTE) SARS-CoV-2 target nucleic acids are NOT DETECTED.  The SARS-CoV-2 RNA is generally detectable in upper respiratory specimens during the acute phase of infection. The lowest concentration of SARS-CoV-2 viral copies this assay can detect is 138 copies/mL. A negative result does not preclude SARS-Cov-2 infection and should not be used as the sole basis for treatment or other patient management decisions. A negative result may occur with  improper specimen collection/handling, submission of specimen other than nasopharyngeal swab, presence of viral mutation(s) within the areas targeted by this assay, and inadequate number of viral copies(<138 copies/mL). A negative result must be combined with clinical observations, patient history, and epidemiological information. The expected result is Negative.  Fact Sheet for Patients:  EntrepreneurPulse.com.au  Fact Sheet for Healthcare Providers:  IncredibleEmployment.be  This test is no t yet approved or cleared by the [place] FDA and  has been authorized for detection and/or diagnosis of SARS-CoV-2 by FDA under an Emergency Use Authorization (EUA). This EUA will remain  in effect (meaning this test can be used) for the duration of the COVID-19 declaration under Section 564(b)(1) of the Act, 21 U.S.C.section 360bbb-3(b)(1), unless the authorization is terminated  or revoked sooner.       Influenza A by PCR NEGATIVE NEGATIVE Final   Influenza B by PCR NEGATIVE NEGATIVE Final    Comment: (NOTE) The Xpert Xpress SARS-CoV-2/FLU/RSV plus assay is intended as an aid in the diagnosis of influenza from Nasopharyngeal swab specimens and should not be used as a sole basis for treatment. Nasal washings and aspirates are unacceptable for Xpert Xpress SARS-CoV-2/FLU/RSV testing.  Fact Sheet for  Patients: EntrepreneurPulse.com.au  Fact Sheet for Healthcare Providers: IncredibleEmployment.be  This test is not yet approved or cleared by the [place] FDA and has been authorized for detection and/or diagnosis of SARS-CoV-2 by FDA under an Emergency Use Authorization (EUA). This EUA will remain in effect (meaning this test can be used) for the duration of the COVID-19 declaration under Section 564(b)(1) of the Act, 21 U.S.C. section 360bbb-3(b)(1), unless the authorization is terminated or revoked.  Performed at [facility], [address], [place], [place] [address]   MRSA PCR Screening     Status: None   Collection Time: 08/09/20  4:00 AM   Specimen: Nasal Mucosa; Nasopharyngeal  Result Value Ref Range Status   MRSA by PCR NEGATIVE NEGATIVE Final    Comment:        The GeneXpert MRSA Assay (FDA approved for NASAL specimens only), is one component of a comprehensive MRSA colonization surveillance program. It is not intended to diagnose MRSA infection nor to guide or monitor treatment for MRSA infections. Performed at [facility], [address] [place], [place] [address]  Radiology Reports DG Chest 2 View  Result Date: 08/10/2020 CLINICAL DATA:  Shortness of breath. EXAM: CHEST - 2 VIEW COMPARISON:  08/08/2020. FINDINGS: Trachea is midline. Heart is mildly enlarged. Thoracic aorta is calcified. Mild interstitial prominence and indistinctness appear similar. Small bilateral pleural effusions, new or increased. IMPRESSION: 1. Congestive heart failure. 2.  Aortic atherosclerosis (ICD10-I70.0). Electronically Signed   By: Lorin Picket M.D.   On: 08/10/2020 08:50   DG Chest Port 1 View  Result Date: 08/08/2020 CLINICAL DATA:  Shortness of breath that started last night EXAM: PORTABLE CHEST 1 VIEW COMPARISON:  05/08/2020 FINDINGS: Normal heart size and mediastinal contours. Diffuse interstitial prominence similar to prior. [place]  lines are noted. No visible effusion or air leak. Artifact from EKG leads. IMPRESSION: Interstitial opacity favoring edema. Electronically Signed   By: Monte Fantasia M.D.   On: 08/08/2020 08:42     CBC Recent Labs  Lab 08/08/20 0810 08/09/20 0422 08/10/20 0516  WBC 4.8 6.9 4.8  HGB 8.6* 9.1* 8.5*  HCT 27.6* 30.0* 27.9*  PLT 154 180 161  MCV 90.8 91.5 90.3  MCH 28.3 27.7 27.5  MCHC 31.2 30.3 30.5  RDW 17.8* 17.7* 17.8*  LYMPHSABS 0.9  --   --   MONOABS 0.4  --   --   EOSABS 0.1  --   --   BASOSABS 0.0  --   --    Chemistries  Recent Labs  Lab 08/08/20 0810 08/09/20 0422 08/10/20 0516  NA 140 138 139  K 3.3* 3.4* 3.9  CL 106 104 102  CO2 22 23 24   GLUCOSE 129* 114* 93  BUN 98* 91* 91*  CREATININE 3.72* 3.52* 3.92*  CALCIUM 8.1* 8.8* 8.9  MG  --  1.5* 1.7  AST 21  --   --   ALT 17  --   --   ALKPHOS 75  --   --   BILITOT 0.7  --   --    ------------------------------------------------------------------------------------------------------------------ No results for input(s): CHOL, HDL, LDLCALC, TRIG, CHOLHDL, LDLDIRECT in the last 72 hours.  Lab Results  Component Value Date   HGBA1C 5.6 09/19/2019   ------------------------------------------------------------------------------------------------------------------ Recent Labs    08/08/20 1900  TSH 2.122   ------------------------------------------------------------------------------------------------------------------ No results for input(s): VITAMINB12, FOLATE, FERRITIN, TIBC, IRON, RETICCTPCT in the last 72 hours.  Coagulation profile No results for input(s): INR, PROTIME in the last 168 hours.  No results for input(s): DDIMER in the last 72 hours.  Cardiac Enzymes No results for input(s): CKMB, TROPONINI, MYOGLOBIN in the last 168 hours.  Invalid input(s): CK ------------------------------------------------------------------------------------------------------------------    Component Value  Date/Time   BNP 2,146.0 (H) 08/10/2020 0370     Roxan Hockey M.D on 08/10/2020 at 2:39 PM  Go to [email or web address] - for contact info  [facility] - Office  [phone]

## 2020-08-10 NOTE — Progress Notes (Signed)
Patient is sitting  Up at bedside. Patient has ambulated in room with little to no SOB on excertion. Patient voices no complaints at this time

## 2020-08-11 ENCOUNTER — Encounter (HOSPITAL_COMMUNITY)
Admission: RE | Admit: 2020-08-11 | Discharge: 2020-08-11 | Disposition: A | Discharge status: Home / Self Care | Payer: No Typology Code available for payment source | Source: Ambulatory Visit | Attending: Nephrology | Admitting: Nephrology

## 2020-08-11 DIAGNOSIS — D631 Anemia in chronic kidney disease: Secondary | ICD-10-CM

## 2020-08-11 DIAGNOSIS — N185 Chronic kidney disease, stage 5: Secondary | ICD-10-CM

## 2020-08-11 LAB — CBC
HCT: 25.7 % — ABNORMAL LOW (ref 39.0–52.0)
Hemoglobin: 8.1 g/dL — ABNORMAL LOW (ref 13.0–17.0)
MCH: 28.2 pg (ref 26.0–34.0)
MCHC: 31.5 g/dL (ref 30.0–36.0)
MCV: 89.5 fL (ref 80.0–100.0)
Platelets: 156 10*3/uL (ref 150–400)
RBC: 2.87 MIL/uL — ABNORMAL LOW (ref 4.22–5.81)
RDW: 17.8 % — ABNORMAL HIGH (ref 11.5–15.5)
WBC: 4.7 10*3/uL (ref 4.0–10.5)
nRBC: 0 % (ref 0.0–0.2)

## 2020-08-11 LAB — RENAL FUNCTION PANEL
Albumin: 3 g/dL — ABNORMAL LOW (ref 3.5–5.0)
Anion gap: 12 (ref 5–15)
BUN: 98 mg/dL — ABNORMAL HIGH (ref 8–23)
CO2: 23 mmol/L (ref 22–32)
Calcium: 8.6 mg/dL — ABNORMAL LOW (ref 8.9–10.3)
Chloride: 100 mmol/L (ref 98–111)
Creatinine, Ser: 3.89 mg/dL — ABNORMAL HIGH (ref 0.61–1.24)
GFR, Estimated: 15 mL/min — ABNORMAL LOW (ref >60)
Glucose, Bld: 92 mg/dL (ref 70–99)
Phosphorus: 3.8 mg/dL (ref 2.5–4.6)
Potassium: 4.1 mmol/L (ref 3.5–5.1)
Sodium: 135 mmol/L (ref 135–145)

## 2020-08-11 MED ORDER — FUROSEMIDE 40 MG PO TABS: 60.0000 mg | ORAL_TABLET | Freq: Two times a day (BID) | ORAL | 4 refills | Status: DC

## 2020-08-11 MED ORDER — DARBEPOETIN ALFA 60 MCG/0.3ML IJ SOSY
60.0000 ug | PREFILLED_SYRINGE | Freq: Once | INTRAMUSCULAR | Status: AC
  Administered 2020-08-11: 60 ug via SUBCUTANEOUS
  Filled 2020-08-11: qty 0.3

## 2020-08-11 MED ORDER — MAGNESIUM OXIDE 400 MG PO TABS: 400.0000 mg | ORAL_TABLET | Freq: Every day | ORAL | 2 refills | Status: DC

## 2020-08-11 NOTE — Progress Notes (Signed)
Patient ambulated around unit. Tolerated very well! Patient's O2 sat before ambulating was 94% on room air. Patient's O2 sat after ambulating was 95% on room air.

## 2020-08-11 NOTE — Discharge Summary (Signed)
[name], is a 75 y.o. male  DOB 1945-08-21  MRN [id number].  Admission date:  08/08/2020  Admitting Physician  Roxan Hockey, MD  Discharge Date:  08/11/2020   Primary MD  Clinic, [facility]  Recommendations for primary care physician for things to follow:   1)Very low-salt diet advised--less than 2 g of sodium per day advised -sausages, bacon, ham and canned food items are usually high in salt 2)Weigh yourself daily, call if you gain more than 3 pounds in 1 day or more than 5 pounds in 1 week as your diuretic medications may need to be adjusted 3)Limit your Fluid  intake to no more than 60 ounces (1.8 Liters) per day 4)Avoid ibuprofen/Advil/Aleve/Motrin/Goody Powders/Naproxen/BC powders/Meloxicam/Diclofenac/Indomethacin and other Nonsteroidal anti-inflammatory medications as these will make you more likely to bleed and can cause stomach ulcers, can also cause Kidney problems.  5) please note start your Lasix/furosemide-fluid medication has been changed to 60 mg twice daily 6) you will need a repeat CBC and BMP blood test within 1 week with your primary care physician  Admission Diagnosis  Acute exacerbation of CHF (congestive heart failure) ([place]) [Y85.0] Systolic congestive heart failure, unspecified HF chronicity (HCC) [I50.20]   Discharge Diagnosis  Acute exacerbation of CHF (congestive heart failure) (HCC) [Y77.4] Systolic congestive heart failure, unspecified HF chronicity ([place]) [I50.20]    Principal Problem:   Acute exacerbation of Diastolic CHF (congestive heart failure) (HCC) Active Problems:   (HFpEF) heart failure with preserved ejection fraction (HCC)   CKD (chronic kidney disease), stage V (HCC)   COPD without exacerbation (HCC)---[employer] Years   Essential hypertension   Obstructive sleep apnea   Anemia in chronic kidney disease   Coronary artery disease involving native  coronary artery of native heart without angina pectoris     Past Medical History:  Diagnosis Date  . Acute myocardial infarction of other inferior wall, initial episode of care   . Anemia   . Anginal pain ([place])   . Anxiety   . Arthritis   . Cancer [facility])    bladder 2013 removed, has cystoscopy 10/2016, has returned x 2  . COPD (chronic obstructive pulmonary disease) ([place])   . Coronary artery disease    Artery bypass graft Jan 2008  . Dyspnea   . Dysrhythmia   . GERD (gastroesophageal reflux disease)   . Heart murmur   . History of hiatal hernia   . Hyperlipidemia   . Hypertension   . Hypothyroidism   . PONV (postoperative nausea and vomiting)   . Sleep apnea   . Stroke ([place])   . Tobacco user     Past Surgical History:  Procedure Laterality Date  . APPENDECTOMY    . AV FISTULA PLACEMENT Right 02/11/2020   Procedure: RIGHT ARM BRACHIOCEPHALIC ARTERIOVENOUS (AV) FISTULA;  Surgeon: Angelia Mould, MD;  Location: [place];  Service: Vascular;  Laterality: Right;  . CARDIAC CATHETERIZATION    . CORONARY ARTERY BYPASS GRAFT  06/14/2006   x 5  . CORONARY ARTERY  BYPASS GRAFT N/A 09/19/2016   Procedure: REDO CORONARY ARTERY BYPASS GRAFTING (CABG) x two, using right internal mammary artery and left radial artery;  Surgeon: [name], MD;  Location: [place] OR;  Service: Open Heart Surgery;  Laterality: N/A;  . FOOT SURGERY    . HERNIA REPAIR    . LEFT HEART CATH AND CORS/GRAFTS ANGIOGRAPHY N/A 09/14/2016   Procedure: Left Heart Cath and Cors/Grafts Angiography;  Surgeon: Troy Sine, MD;  Location: [place] CV LAB;  Service: Cardiovascular;  Laterality: N/A;  . LEFT HEART CATHETERIZATION WITH CORONARY/GRAFT ANGIOGRAM N/A 01/31/2014   Procedure: LEFT HEART CATHETERIZATION WITH Beatrix Fetters;  Surgeon: Sanda Klein, MD;  Location: [place] CATH LAB;  Service: Cardiovascular;  Laterality: N/A;  . NOSE SURGERY    . PERCUTANEOUS CORONARY STENT INTERVENTION (PCI-S)  01/31/2014    Procedure: PERCUTANEOUS CORONARY STENT INTERVENTION (PCI-S);  Surgeon: Sanda Klein, MD;  Location: [facility] CATH LAB;  Service: Cardiovascular;;  . RADIAL ARTERY HARVEST Left 09/19/2016   Procedure: RADIAL ARTERY HARVEST;  Surgeon: [name], MD;  Location: [place];  Service: Open Heart Surgery;  Laterality: Left;  . TEE WITHOUT CARDIOVERSION N/A 09/19/2016   Procedure: TRANSESOPHAGEAL ECHOCARDIOGRAM (TEE);  Surgeon: [name], MD;  Location: [place];  Service: Open Heart Surgery;  Laterality: N/A;     HPI  from the history and physical done on the day of admission:     [name]  is a 75 y.o. male with past medical history relevant for CAD(s/p prior CABG), COPD, HTN, BPHand CKD Vas well as chronic anemia of CKDpresents to the ED with worsening shortness of breath orthopnea and dyspnea on exertion over the last week particularly worse over the last 24 hours --Patient has been having difficulty walking the dog, he cannot walk to the bathroom now with being very short of breath -ED O2 sats 85 to 88% on room air 90 to 92% on 2 L - -Additional history obtained from patient's wife at bedside -pt admits to non-compliance with dietary/salt restrictions, also admits to excessive fluid intake and noncompliance with Lasix- PTA --Patient has been eating lots of [place] bacon, getting high salt soups from the daily, even taking only 40 of Lasix twice daily instead of 60 in the a.m. and 40 in the evening --Denies chest pains, no leg pains no pleuritic symptoms No fever  Or chills , no productive cough -No vomiting no diarrhea -Received Lasix IV 80 mg in the ED and voided about 700 mL   Hospital Course:   Brief Summary:- 75 y.o.malewith past medical history relevant for CAD(s/p prior CABG), COPD, HTN, BPHand CKD Vas well as chronic anemia of CKDadmitted on 08/08/2020 with acute on chronic diastolic dysfunction CHF with acute hypoxic respiratory failure  A/p 1)HFpEF--patient  withh/ochronic diastolic dysfunction CHF--admitted with acute on chronic diastolic CHF exacerbationresulting in acute hypoxic respiratory failure --Echo from12/25/2021 revealedecho with EF down to55to 60% fromprior EF of60to 65% withgrade 1diastolic dysfunction -pt admits to non-compliance withdietary/salt restrictions,also admits to excessive fluid intake and noncompliance with Lasix- PTA --Monitor electrolytes closely with CKD stage V -Clinical exam and serial chest x-ray was consistent with CHF  -BNP was 2,146 --Cardiopulmonary status improved significantly with IV diuresis -Balance was negative, hypoxia resolved -Today post ambulation O2 sats 94 to 95% on room air -Discharge on furosemide 60 mg p.o. twice daily -  2)ElevatedTroponin--serialEKG with sinus rhythm, QTC borderline elevated, borderline repolarization normalities without frank ischemic/ST segment changes -Troponin bump in the setting of CHF  and CKD 5--no frank ACS -Troponin 279>> 323>>300>>246 -Remains chest pain-free - 3)CKD V ---  --Patient's primary nephrologist is Dr. Theador Hawthorne in [place] -Creatinine was3.72 on admission-which is close to patient's recent baseline  -Pt is s/prightbrachiocephalic arteriovenous fistulaby Dr. Scot Dock 9/28/21in anticipation of hemodialysis initiation in the near future -PTHandvitamin Dwere checked recently -renally adjust medications, avoid nephrotoxic agents / dehydration / hypotension -Creatinine is 3.89  from 3.72 on admission- -follow-up with nephrologist for BMP recheck within a week given adjustments in diuretic therapy - -Patient has 10.6 cmLt renal cyst (not new)  4)Chronicnormocytic and normochromicAnemia of CKD--- -Hemoglobin is9.1which is close to patient's recent baseline, Procrit/ESA as pernephrologist -No bleeding concerns  5)CAD-prior CABG in 2008,LHC in 2015 with angioplasty and stenting--- -Troponin iselevated as above #2in  the setting ofCKD V and CHF exac ----patient is chest pain-free currently  -ContinueLipitor, Plavix and isosorbide  6)HTN--continue isosorbide/hydralazine combo,amlodipine 10 mg daily, and Coreg 3.25 mg daily -Lasix adjusted to 60 mg twice daily  7)BPH--stable, continue Flomaxand finasteride  8)Hypothyroidism---stable, continue Levothyroxine 50 mcg daily -TSH is 2.1  9)10.6 CM-left renal cyst-(not new)--  outpatient imaging surveillance/follow-up advised  10)H/o FattyLiverand Cholelithiasis----asymptomatic, --continue Lipitor for CAD,  11)COPD--reformed smoker,no acute exacerbation at this time -Bronchodilators ordered  12)Acute Hypoxic Respiratory Failure---due to #1 above -Resolved with diuresis as noted above #1  13)Hyponatremia and Hypomagnesemia--- in the setting of IV diuresis, repleted -Okay to discharge on p.o. mag and p.o. potassium while on Lasix  Disposition-Home Dispo: The patient is from:Home Anticipated d/c is HW:EXHB   Code Status :  -  Code Status: Full Code   Family Communication:    (patient is alert, awake and coherent)  Discussed with wife   Consults  : na  Discharge Condition: stable  Follow UP   Follow-up Information    Clinic, [facility] Schedule an appointment as soon as possible for a visit in 1 week(s).   Why: You need repeat CBC and BMP blood test within a week Contact information: [place] [address] [phone]        Arnoldo Lenis, MD .   Specialty: Cardiology Contact information: [place] [address] [phone]                Diet and Activity recommendation:  As advised  Discharge Instructions    Discharge Instructions    Call MD for:  difficulty breathing, headache or visual disturbances   Complete by: As directed    Call MD for:  persistant dizziness or light-headedness    Complete by: As directed    Call MD for:  persistant nausea and vomiting   Complete by: As directed    Call MD for:  temperature >100.4   Complete by: As directed    Diet - low sodium heart healthy   Complete by: As directed    Discharge instructions   Complete by: As directed    1)Very low-salt diet advised--less than 2 g of sodium per day advised -sausages, bacon, ham and canned food items are usually high in salt 2)Weigh yourself daily, call if you gain more than 3 pounds in 1 day or more than 5 pounds in 1 week as your diuretic medications may need to be adjusted 3)Limit your Fluid  intake to no more than 60 ounces (1.8 Liters) per day 4)Avoid ibuprofen/Advil/Aleve/Motrin/Goody Powders/Naproxen/BC powders/Meloxicam/Diclofenac/Indomethacin and other Nonsteroidal anti-inflammatory medications as these will make you more likely to bleed and can cause stomach ulcers,  can also cause Kidney problems.  5) please note start your Lasix/furosemide-fluid medication has been changed to 60 mg twice daily 6) you will need a repeat CBC and BMP blood test within 1 week with your primary care physician   Increase activity slowly   Complete by: As directed        Discharge Medications     Allergies as of 08/11/2020      Reactions   Cardizem Cd [diltiazem Hcl Er Beads] Palpitations   "Makes heart skip"      Medication List    STOP taking these medications   famotidine 40 MG tablet Commonly known as: PEPCID   oxyCODONE-acetaminophen 5-325 MG tablet Commonly known as: Percocet     TAKE these medications   acetaminophen 325 MG tablet Commonly known as: TYLENOL Take 2 tablets (650 mg total) by mouth every 6 (six) hours as needed for mild pain.   allopurinol 100 MG tablet Commonly known as: ZYLOPRIM Take 100 mg by mouth daily.   amLODipine 10 MG tablet Commonly known as: NORVASC Take 1 tablet (10 mg total) by mouth daily.   atorvastatin 80 MG tablet Commonly known as: LIPITOR Take  80 mg by mouth daily.   carvedilol 3.125 MG tablet Commonly known as: Coreg Take 1 tablet (3.125 mg total) by mouth 2 (two) times daily.   clopidogrel 75 MG tablet Commonly known as: PLAVIX Take 75 mg by mouth daily.   epoetin alfa 20000 UNIT/ML injection Commonly known as: EPOGEN Inject 20,000 Units into the skin every Monday.   finasteride 5 MG tablet Commonly known as: PROSCAR Take 5 mg by mouth daily.   folic acid 1 MG tablet Commonly known as: FOLVITE Take 1 mg by mouth daily.   furosemide 40 MG tablet Commonly known as: LASIX Take 1.5 tablets (60 mg total) by mouth 2 (two) times daily. What changed:   how much to take  when to take this  additional instructions   hydrALAZINE 100 MG tablet Commonly known as: APRESOLINE Take 1 tablet (100 mg total) by mouth 3 (three) times daily.   isosorbide mononitrate 120 MG 24 hr tablet Commonly known as: IMDUR Take 1 tablet (120 mg total) by mouth daily. What changed: how much to take   Klor-Con M20 20 MEQ tablet Generic drug: potassium chloride SA Take 20 mEq by mouth daily. Take one tablet Monday Wednesday and Friday   levothyroxine 50 MCG tablet Commonly known as: SYNTHROID Take 50 mcg by mouth daily before breakfast.   magnesium oxide 400 MG tablet Commonly known as: MAG-OX Take 1 tablet (400 mg total) by mouth daily.   nitroGLYCERIN 0.4 MG SL tablet Commonly known as: NITROSTAT Place 0.4 mg under the tongue every 5 (five) minutes as needed for chest pain (max 3 doses).   omeprazole 20 MG capsule Commonly known as: PRILOSEC Take 20 mg by mouth 2 (two) times daily.   RA Fish Oil 1000 MG Caps Take 2,000 mg by mouth 2 (two) times daily.   tamsulosin 0.4 MG Caps capsule Commonly known as: FLOMAX Take 0.4 mg by mouth daily.   Vitamin D (Cholecalciferol) 25 MCG (1000 UT) Caps Take 1,000 Units by mouth daily.       Major procedures and Radiology Reports - PLEASE review detailed and final reports for all  details, in brief -   DG Chest 2 View  Result Date: 08/10/2020 CLINICAL DATA:  Shortness of breath. EXAM: CHEST - 2 VIEW COMPARISON:  08/08/2020. FINDINGS: Trachea is midline.  Heart is mildly enlarged. Thoracic aorta is calcified. Mild interstitial prominence and indistinctness appear similar. Small bilateral pleural effusions, new or increased. IMPRESSION: 1. Congestive heart failure. 2.  Aortic atherosclerosis (ICD10-I70.0). Electronically Signed   By: Lorin Picket M.D.   On: 08/10/2020 08:50   DG Chest Port 1 View  Result Date: 08/08/2020 CLINICAL DATA:  Shortness of breath that started last night EXAM: PORTABLE CHEST 1 VIEW COMPARISON:  05/08/2020 FINDINGS: Normal heart size and mediastinal contours. Diffuse interstitial prominence similar to prior. Kerley lines are noted. No visible effusion or air leak. Artifact from EKG leads. IMPRESSION: Interstitial opacity favoring edema. Electronically Signed   By: Monte Fantasia M.D.   On: 08/08/2020 08:42    Micro Results   Recent Results (from the past 240 hour(s))  Resp Panel by RT-PCR (Flu A&B, Covid) Nasopharyngeal Swab     Status: None   Collection Time: 08/08/20  8:17 AM   Specimen: Nasopharyngeal Swab; Nasopharyngeal(NP) swabs in vial transport medium  Result Value Ref Range Status   SARS Coronavirus 2 by RT PCR NEGATIVE NEGATIVE Final    Comment: (NOTE) SARS-CoV-2 target nucleic acids are NOT DETECTED.  The SARS-CoV-2 RNA is generally detectable in upper respiratory specimens during the acute phase of infection. The lowest concentration of SARS-CoV-2 viral copies this assay can detect is 138 copies/mL. A negative result does not preclude SARS-Cov-2 infection and should not be used as the sole basis for treatment or other patient management decisions. A negative result may occur with  improper specimen collection/handling, submission of specimen other than nasopharyngeal swab, presence of viral mutation(s) within the areas  targeted by this assay, and inadequate number of viral copies(<138 copies/mL). A negative result must be combined with clinical observations, patient history, and epidemiological information. The expected result is Negative.  Fact Sheet for Patients:  EntrepreneurPulse.com.au  Fact Sheet for Healthcare Providers:  IncredibleEmployment.be  This test is no t yet approved or cleared by the [place] FDA and  has been authorized for detection and/or diagnosis of SARS-CoV-2 by FDA under an Emergency Use Authorization (EUA). This EUA will remain  in effect (meaning this test can be used) for the duration of the COVID-19 declaration under Section 564(b)(1) of the Act, 21 U.S.C.section 360bbb-3(b)(1), unless the authorization is terminated  or revoked sooner.       Influenza A by PCR NEGATIVE NEGATIVE Final   Influenza B by PCR NEGATIVE NEGATIVE Final    Comment: (NOTE) The Xpert Xpress SARS-CoV-2/FLU/RSV plus assay is intended as an aid in the diagnosis of influenza from Nasopharyngeal swab specimens and should not be used as a sole basis for treatment. Nasal washings and aspirates are unacceptable for Xpert Xpress SARS-CoV-2/FLU/RSV testing.  Fact Sheet for Patients: EntrepreneurPulse.com.au  Fact Sheet for Healthcare Providers: IncredibleEmployment.be  This test is not yet approved or cleared by the [place] FDA and has been authorized for detection and/or diagnosis of SARS-CoV-2 by FDA under an Emergency Use Authorization (EUA). This EUA will remain in effect (meaning this test can be used) for the duration of the COVID-19 declaration under Section 564(b)(1) of the Act, 21 U.S.C. section 360bbb-3(b)(1), unless the authorization is terminated or revoked.  Performed at [facility], [address] [place], [place] [address]   MRSA PCR Screening     Status: None   Collection Time: 08/09/20  4:00 AM    Specimen: Nasal Mucosa; Nasopharyngeal  Result Value Ref Range Status   MRSA by PCR NEGATIVE NEGATIVE Final  Comment:        The GeneXpert MRSA Assay (FDA approved for NASAL specimens only), is one component of a comprehensive MRSA colonization surveillance program. It is not intended to diagnose MRSA infection nor to guide or monitor treatment for MRSA infections. Performed at [facility], [address], [place], [place] [address]        Today   Subjective    [name] today has no new complaints -Ambulated in hallways without significant dyspnea on exertion dizziness or chest pains        -Voiding well -O2 sats post ambulation 94 to 95% on room air   Patient has been seen and examined prior to discharge   Objective   Blood pressure (!) 154/60, pulse 71, temperature 97.8 F (36.6 C), temperature source Oral, resp. rate 17, height 5\' 9"  (1.753 m), weight 83.9 kg, SpO2 93 %.   Intake/Output Summary (Last 24 hours) at 08/11/2020 1038 Last data filed at 08/11/2020 0900 Gross per 24 hour  Intake 838 ml  Output 1450 ml  Net -612 ml    Exam Gen:- Awake Alert, no acute distress, speaking in complete sentences HEENT:- [place].AT, No sclera icterus Neck-Supple Neck,No JVD,.  Lungs-improved air movement, no wheezing or rales CV- S1, S2 normal, regular, prior CABG scar Abd-  +ve B.Sounds, Abd Soft, No tenderness,    Extremity/Skin:-Much improved edema,   good pulses Psych-affect is appropriate, oriented x3 Neuro-no new focal deficits, no tremors  MSK-right upper extremity AV fistula with positive thrill and bruit   Data Review   CBC w Diff:  Lab Results  Component Value Date   WBC 4.7 08/11/2020   HGB 8.1 (L) 08/11/2020   HCT 25.7 (L) 08/11/2020   PLT 156 08/11/2020   LYMPHOPCT 20 08/08/2020   MONOPCT 7 08/08/2020   EOSPCT 2 08/08/2020   BASOPCT 1 08/08/2020    CMP:  Lab Results  Component Value Date   NA 135 08/11/2020   K 4.1 08/11/2020   CL 100  08/11/2020   CO2 23 08/11/2020   BUN 98 (H) 08/11/2020   CREATININE 3.89 (H) 08/11/2020   PROT 6.4 (L) 08/08/2020   ALBUMIN 3.0 (L) 08/11/2020   BILITOT 0.7 08/08/2020   ALKPHOS 75 08/08/2020   AST 21 08/08/2020   ALT 17 08/08/2020  .   Total Discharge time is about 33 minutes  Roxan Hockey M.D on 08/11/2020 at 10:38 AM  Go to [email or web address] -  for contact info  [facility] - Office  [phone]

## 2020-08-11 NOTE — Discharge Instructions (Signed)
1)Very low-salt diet advised--less than 2 g of sodium per day advised -sausages, bacon, ham and canned food items are usually high in salt 2)Weigh yourself daily, call if you gain more than 3 pounds in 1 day or more than 5 pounds in 1 week as your diuretic medications may need to be adjusted 3)Limit your Fluid  intake to no more than 60 ounces (1.8 Liters) per day 4)Avoid ibuprofen/Advil/Aleve/Motrin/Goody Powders/Naproxen/BC powders/Meloxicam/Diclofenac/Indomethacin and other Nonsteroidal anti-inflammatory medications as these will make you more likely to bleed and can cause stomach ulcers, can also cause Kidney problems.  5) please note start your Lasix/furosemide-fluid medication has been changed to 60 mg twice daily 6) you will need a repeat CBC and BMP blood test within 1 week with your primary care physician

## 2020-08-18 ENCOUNTER — Encounter (HOSPITAL_COMMUNITY): Payer: Self-pay

## 2020-08-18 ENCOUNTER — Other Ambulatory Visit: Payer: Self-pay

## 2020-08-18 ENCOUNTER — Encounter (HOSPITAL_COMMUNITY)
Admit: 2020-08-18 | Discharge: 2020-08-18 | Disposition: A | Discharge status: Home / Self Care | Payer: Medicare Other | Source: Ambulatory Visit | Attending: Nephrology | Admitting: Nephrology

## 2020-08-18 DIAGNOSIS — N184 Chronic kidney disease, stage 4 (severe): Secondary | ICD-10-CM | POA: Insufficient documentation

## 2020-08-18 DIAGNOSIS — D631 Anemia in chronic kidney disease: Secondary | ICD-10-CM | POA: Insufficient documentation

## 2020-08-18 MED ORDER — EPOETIN ALFA 20000 UNIT/ML IJ SOLN
20000.0000 [IU] | Freq: Once | INTRAMUSCULAR | Status: AC
  Administered 2020-08-18: 20000 [IU] via SUBCUTANEOUS
  Filled 2020-08-18: qty 1

## 2020-08-25 ENCOUNTER — Encounter (HOSPITAL_COMMUNITY)
Admission: RE | Admit: 2020-08-25 | Discharge: 2020-08-25 | Disposition: A | Discharge status: Home / Self Care | Payer: Medicare Other | Source: Ambulatory Visit | Attending: Nephrology | Admitting: Nephrology

## 2020-08-25 ENCOUNTER — Other Ambulatory Visit: Payer: Self-pay

## 2020-08-25 DIAGNOSIS — D631 Anemia in chronic kidney disease: Secondary | ICD-10-CM | POA: Diagnosis not present

## 2020-08-25 DIAGNOSIS — N184 Chronic kidney disease, stage 4 (severe): Secondary | ICD-10-CM | POA: Diagnosis not present

## 2020-08-25 MED ORDER — EPOETIN ALFA 20000 UNIT/ML IJ SOLN
INTRAMUSCULAR | Status: AC
  Administered 2020-08-25: 20000 [IU]
  Filled 2020-08-25: qty 1

## 2020-08-25 MED ORDER — EPOETIN ALFA 20000 UNIT/ML IJ SOLN: 20000.0000 [IU] | Freq: Once | INTRAMUSCULAR | Status: DC

## 2020-08-25 MED ORDER — EPOETIN ALFA 20000 UNIT/ML IJ SOLN
INTRAMUSCULAR | Status: AC
  Filled 2020-08-25: qty 1

## 2020-08-27 LAB — POCT HEMOGLOBIN-HEMACUE
Hemoglobin: 8 g/dL — ABNORMAL LOW (ref 13.0–17.0)
Hemoglobin: 7.6 g/dL — ABNORMAL LOW (ref 13.0–17.0)

## 2020-08-30 ENCOUNTER — Emergency Department (HOSPITAL_COMMUNITY): Payer: No Typology Code available for payment source

## 2020-08-30 ENCOUNTER — Inpatient Hospital Stay (HOSPITAL_COMMUNITY)
Admission: EM | Admit: 2020-08-30 | Discharge: 2020-09-04 | DRG: 377 | Disposition: A | Discharge status: Home / Self Care | Payer: No Typology Code available for payment source | Attending: Internal Medicine | Admitting: Internal Medicine

## 2020-08-30 ENCOUNTER — Encounter (HOSPITAL_COMMUNITY): Payer: Self-pay | Admitting: Emergency Medicine

## 2020-08-30 ENCOUNTER — Other Ambulatory Visit: Payer: Self-pay

## 2020-08-30 DIAGNOSIS — I5033 Acute on chronic diastolic (congestive) heart failure: Secondary | ICD-10-CM | POA: Diagnosis not present

## 2020-08-30 DIAGNOSIS — N179 Acute kidney failure, unspecified: Secondary | ICD-10-CM | POA: Diagnosis not present

## 2020-08-30 DIAGNOSIS — Z8673 Personal history of transient ischemic attack (TIA), and cerebral infarction without residual deficits: Secondary | ICD-10-CM

## 2020-08-30 DIAGNOSIS — D62 Acute posthemorrhagic anemia: Secondary | ICD-10-CM | POA: Diagnosis not present

## 2020-08-30 DIAGNOSIS — N185 Chronic kidney disease, stage 5: Secondary | ICD-10-CM | POA: Diagnosis not present

## 2020-08-30 DIAGNOSIS — I5031 Acute diastolic (congestive) heart failure: Secondary | ICD-10-CM | POA: Diagnosis present

## 2020-08-30 DIAGNOSIS — Z951 Presence of aortocoronary bypass graft: Secondary | ICD-10-CM

## 2020-08-30 DIAGNOSIS — G4733 Obstructive sleep apnea (adult) (pediatric): Secondary | ICD-10-CM | POA: Diagnosis present

## 2020-08-30 DIAGNOSIS — Z8551 Personal history of malignant neoplasm of bladder: Secondary | ICD-10-CM

## 2020-08-30 DIAGNOSIS — I10 Essential (primary) hypertension: Secondary | ICD-10-CM | POA: Diagnosis not present

## 2020-08-30 DIAGNOSIS — I132 Hypertensive heart and chronic kidney disease with heart failure and with stage 5 chronic kidney disease, or end stage renal disease: Secondary | ICD-10-CM | POA: Diagnosis present

## 2020-08-30 DIAGNOSIS — I2510 Atherosclerotic heart disease of native coronary artery without angina pectoris: Secondary | ICD-10-CM | POA: Diagnosis not present

## 2020-08-30 DIAGNOSIS — I5023 Acute on chronic systolic (congestive) heart failure: Secondary | ICD-10-CM

## 2020-08-30 DIAGNOSIS — E039 Hypothyroidism, unspecified: Secondary | ICD-10-CM | POA: Diagnosis present

## 2020-08-30 DIAGNOSIS — K5730 Diverticulosis of large intestine without perforation or abscess without bleeding: Secondary | ICD-10-CM | POA: Diagnosis not present

## 2020-08-30 DIAGNOSIS — K219 Gastro-esophageal reflux disease without esophagitis: Secondary | ICD-10-CM | POA: Diagnosis present

## 2020-08-30 DIAGNOSIS — J449 Chronic obstructive pulmonary disease, unspecified: Secondary | ICD-10-CM | POA: Diagnosis not present

## 2020-08-30 DIAGNOSIS — G4730 Sleep apnea, unspecified: Secondary | ICD-10-CM | POA: Diagnosis present

## 2020-08-30 DIAGNOSIS — D631 Anemia in chronic kidney disease: Secondary | ICD-10-CM | POA: Diagnosis present

## 2020-08-30 DIAGNOSIS — K625 Hemorrhage of anus and rectum: Secondary | ICD-10-CM | POA: Diagnosis not present

## 2020-08-30 DIAGNOSIS — N186 End stage renal disease: Secondary | ICD-10-CM | POA: Diagnosis not present

## 2020-08-30 DIAGNOSIS — K6381 Dieulafoy lesion of intestine: Secondary | ICD-10-CM | POA: Diagnosis not present

## 2020-08-30 DIAGNOSIS — I252 Old myocardial infarction: Secondary | ICD-10-CM | POA: Diagnosis not present

## 2020-08-30 DIAGNOSIS — Z6826 Body mass index (BMI) 26.0-26.9, adult: Secondary | ICD-10-CM

## 2020-08-30 DIAGNOSIS — D122 Benign neoplasm of ascending colon: Secondary | ICD-10-CM | POA: Diagnosis not present

## 2020-08-30 DIAGNOSIS — N400 Enlarged prostate without lower urinary tract symptoms: Secondary | ICD-10-CM | POA: Diagnosis present

## 2020-08-30 DIAGNOSIS — E8889 Other specified metabolic disorders: Secondary | ICD-10-CM | POA: Diagnosis present

## 2020-08-30 DIAGNOSIS — Z20822 Contact with and (suspected) exposure to covid-19: Secondary | ICD-10-CM | POA: Diagnosis not present

## 2020-08-30 DIAGNOSIS — K635 Polyp of colon: Secondary | ICD-10-CM | POA: Diagnosis present

## 2020-08-30 DIAGNOSIS — E782 Mixed hyperlipidemia: Secondary | ICD-10-CM | POA: Diagnosis not present

## 2020-08-30 DIAGNOSIS — Z888 Allergy status to other drugs, medicaments and biological substances status: Secondary | ICD-10-CM

## 2020-08-30 DIAGNOSIS — J9601 Acute respiratory failure with hypoxia: Secondary | ICD-10-CM

## 2020-08-30 DIAGNOSIS — Z79890 Hormone replacement therapy (postmenopausal): Secondary | ICD-10-CM | POA: Diagnosis not present

## 2020-08-30 DIAGNOSIS — N19 Unspecified kidney failure: Secondary | ICD-10-CM | POA: Diagnosis not present

## 2020-08-30 DIAGNOSIS — K921 Melena: Secondary | ICD-10-CM | POA: Diagnosis not present

## 2020-08-30 DIAGNOSIS — Z79899 Other long term (current) drug therapy: Secondary | ICD-10-CM

## 2020-08-30 DIAGNOSIS — N289 Disorder of kidney and ureter, unspecified: Secondary | ICD-10-CM | POA: Diagnosis present

## 2020-08-30 DIAGNOSIS — Z87891 Personal history of nicotine dependence: Secondary | ICD-10-CM

## 2020-08-30 DIAGNOSIS — Z7902 Long term (current) use of antithrombotics/antiplatelets: Secondary | ICD-10-CM | POA: Diagnosis not present

## 2020-08-30 DIAGNOSIS — R627 Adult failure to thrive: Secondary | ICD-10-CM | POA: Diagnosis present

## 2020-08-30 LAB — COMPREHENSIVE METABOLIC PANEL
ALT: 11 U/L (ref 0–44)
AST: 14 U/L — ABNORMAL LOW (ref 15–41)
Albumin: 3.6 g/dL (ref 3.5–5.0)
Alkaline Phosphatase: 75 U/L (ref 38–126)
Anion gap: 13 (ref 5–15)
BUN: 106 mg/dL — ABNORMAL HIGH (ref 8–23)
CO2: 21 mmol/L — ABNORMAL LOW (ref 22–32)
Calcium: 9.2 mg/dL (ref 8.9–10.3)
Chloride: 104 mmol/L (ref 98–111)
Creatinine, Ser: 4.29 mg/dL — ABNORMAL HIGH (ref 0.61–1.24)
GFR, Estimated: 14 mL/min — ABNORMAL LOW (ref >60)
Glucose, Bld: 106 mg/dL — ABNORMAL HIGH (ref 70–99)
Potassium: 4.1 mmol/L (ref 3.5–5.1)
Sodium: 138 mmol/L (ref 135–145)
Total Bilirubin: 1.1 mg/dL (ref 0.3–1.2)
Total Protein: 7 g/dL (ref 6.5–8.1)

## 2020-08-30 LAB — CBC WITH DIFFERENTIAL/PLATELET
Abs Immature Granulocytes: 0.03 10*3/uL (ref 0.00–0.07)
Basophils Absolute: 0 10*3/uL (ref 0.0–0.1)
Basophils Relative: 1 %
Eosinophils Absolute: 0.1 10*3/uL (ref 0.0–0.5)
Eosinophils Relative: 2 %
HCT: 26.6 % — ABNORMAL LOW (ref 39.0–52.0)
Hemoglobin: 8.4 g/dL — ABNORMAL LOW (ref 13.0–17.0)
Immature Granulocytes: 1 %
Lymphocytes Relative: 14 %
Lymphs Abs: 0.8 10*3/uL (ref 0.7–4.0)
MCH: 28.6 pg (ref 26.0–34.0)
MCHC: 31.6 g/dL (ref 30.0–36.0)
MCV: 90.5 fL (ref 80.0–100.0)
Monocytes Absolute: 0.5 10*3/uL (ref 0.1–1.0)
Monocytes Relative: 8 %
Neutro Abs: 4.4 10*3/uL (ref 1.7–7.7)
Neutrophils Relative %: 74 %
Platelets: 177 10*3/uL (ref 150–400)
RBC: 2.94 MIL/uL — ABNORMAL LOW (ref 4.22–5.81)
RDW: 18.1 % — ABNORMAL HIGH (ref 11.5–15.5)
WBC: 5.8 10*3/uL (ref 4.0–10.5)
nRBC: 0 % (ref 0.0–0.2)

## 2020-08-30 LAB — LACTIC ACID, PLASMA: Lactic Acid, Venous: 0.8 mmol/L (ref 0.5–1.9)

## 2020-08-30 LAB — RESP PANEL BY RT-PCR (FLU A&B, COVID) ARPGX2
Influenza A by PCR: NEGATIVE
Influenza B by PCR: NEGATIVE
SARS Coronavirus 2 by RT PCR: NEGATIVE

## 2020-08-30 LAB — TROPONIN I (HIGH SENSITIVITY)
Troponin I (High Sensitivity): 43 ng/L — ABNORMAL HIGH (ref <18)
Troponin I (High Sensitivity): 46 ng/L — ABNORMAL HIGH (ref <18)

## 2020-08-30 LAB — PROTIME-INR
INR: 1.2 (ref 0.8–1.2)
Prothrombin Time: 15.4 seconds — ABNORMAL HIGH (ref 11.4–15.2)

## 2020-08-30 LAB — BRAIN NATRIURETIC PEPTIDE: B Natriuretic Peptide: 2912 pg/mL — ABNORMAL HIGH (ref 0.0–100.0)

## 2020-08-30 LAB — MAGNESIUM: Magnesium: 2 mg/dL (ref 1.7–2.4)

## 2020-08-30 LAB — POC OCCULT BLOOD, ED: Fecal Occult Bld: POSITIVE — AB

## 2020-08-30 MED ORDER — HYDRALAZINE HCL 25 MG PO TABS
100.0000 mg | ORAL_TABLET | Freq: Three times a day (TID) | ORAL | Status: DC
  Administered 2020-08-30 – 2020-09-04 (×14): 100 mg via ORAL
  Filled 2020-08-30 (×16): qty 4

## 2020-08-30 MED ORDER — ACETAMINOPHEN 325 MG PO TABS: 650.0000 mg | ORAL_TABLET | ORAL | Status: DC | PRN

## 2020-08-30 MED ORDER — OMEGA-3-ACID ETHYL ESTERS 1 G PO CAPS
2000.0000 mg | ORAL_CAPSULE | Freq: Two times a day (BID) | ORAL | Status: DC
  Administered 2020-08-30 – 2020-09-04 (×11): 2000 mg via ORAL
  Filled 2020-08-30 (×12): qty 2

## 2020-08-30 MED ORDER — DARBEPOETIN ALFA 100 MCG/0.5ML IJ SOSY
100.0000 ug | PREFILLED_SYRINGE | Freq: Once | INTRAMUSCULAR | Status: AC
  Administered 2020-08-31: 100 ug via INTRAVENOUS
  Filled 2020-08-30: qty 0.5

## 2020-08-30 MED ORDER — VITAMIN D 25 MCG (1000 UNIT) PO TABS
1000.0000 [IU] | ORAL_TABLET | Freq: Every day | ORAL | Status: DC
  Administered 2020-08-30 – 2020-09-04 (×6): 1000 [IU] via ORAL
  Filled 2020-08-30 (×6): qty 1

## 2020-08-30 MED ORDER — SODIUM CHLORIDE 0.9% FLUSH: 3.0000 mL | INTRAVENOUS | Status: DC | PRN

## 2020-08-30 MED ORDER — ONDANSETRON HCL 4 MG/2ML IJ SOLN: 4.0000 mg | Freq: Four times a day (QID) | INTRAMUSCULAR | Status: DC | PRN

## 2020-08-30 MED ORDER — PANTOPRAZOLE SODIUM 40 MG IV SOLR
40.0000 mg | Freq: Once | INTRAVENOUS | Status: AC
  Administered 2020-08-30: 40 mg via INTRAVENOUS
  Filled 2020-08-30: qty 40

## 2020-08-30 MED ORDER — MAGNESIUM OXIDE 400 (241.3 MG) MG PO TABS
400.0000 mg | ORAL_TABLET | Freq: Every day | ORAL | Status: DC
  Administered 2020-08-30 – 2020-09-04 (×6): 400 mg via ORAL
  Filled 2020-08-30 (×6): qty 1

## 2020-08-30 MED ORDER — FOLIC ACID 1 MG PO TABS
1.0000 mg | ORAL_TABLET | Freq: Every day | ORAL | Status: DC
  Administered 2020-08-30 – 2020-09-04 (×6): 1 mg via ORAL
  Filled 2020-08-30 (×6): qty 1

## 2020-08-30 MED ORDER — ALBUTEROL SULFATE (2.5 MG/3ML) 0.083% IN NEBU: 2.5000 mg | INHALATION_SOLUTION | RESPIRATORY_TRACT | Status: DC | PRN

## 2020-08-30 MED ORDER — ALLOPURINOL 100 MG PO TABS
100.0000 mg | ORAL_TABLET | Freq: Every day | ORAL | Status: DC
  Administered 2020-08-30 – 2020-09-04 (×6): 100 mg via ORAL
  Filled 2020-08-30 (×6): qty 1

## 2020-08-30 MED ORDER — FUROSEMIDE 10 MG/ML IJ SOLN
40.0000 mg | Freq: Once | INTRAMUSCULAR | Status: AC
  Administered 2020-08-30: 40 mg via INTRAVENOUS
  Filled 2020-08-30: qty 4

## 2020-08-30 MED ORDER — FINASTERIDE 5 MG PO TABS
5.0000 mg | ORAL_TABLET | Freq: Every day | ORAL | Status: DC
  Administered 2020-08-30 – 2020-09-03 (×5): 5 mg via ORAL
  Filled 2020-08-30 (×5): qty 1

## 2020-08-30 MED ORDER — LEVOTHYROXINE SODIUM 50 MCG PO TABS
50.0000 ug | ORAL_TABLET | Freq: Every day | ORAL | Status: DC
  Administered 2020-08-30 – 2020-09-04 (×6): 50 ug via ORAL
  Filled 2020-08-30 (×6): qty 1

## 2020-08-30 MED ORDER — ISOSORBIDE MONONITRATE ER 60 MG PO TB24
120.0000 mg | ORAL_TABLET | Freq: Every day | ORAL | Status: DC
  Administered 2020-08-31 – 2020-09-04 (×5): 120 mg via ORAL
  Filled 2020-08-30 (×4): qty 2

## 2020-08-30 MED ORDER — ATORVASTATIN CALCIUM 40 MG PO TABS
80.0000 mg | ORAL_TABLET | Freq: Every day | ORAL | Status: DC
  Administered 2020-08-30 – 2020-09-04 (×6): 80 mg via ORAL
  Filled 2020-08-30 (×6): qty 2

## 2020-08-30 MED ORDER — SODIUM CHLORIDE 0.9% FLUSH
3.0000 mL | Freq: Two times a day (BID) | INTRAVENOUS | Status: DC
  Administered 2020-08-31 – 2020-09-03 (×9): 3 mL via INTRAVENOUS

## 2020-08-30 MED ORDER — PANTOPRAZOLE SODIUM 40 MG PO TBEC
40.0000 mg | DELAYED_RELEASE_TABLET | Freq: Every day | ORAL | Status: DC
  Administered 2020-08-30 – 2020-09-04 (×6): 40 mg via ORAL
  Filled 2020-08-30 (×6): qty 1

## 2020-08-30 MED ORDER — ALBUTEROL SULFATE HFA 108 (90 BASE) MCG/ACT IN AERS
2.0000 | INHALATION_SPRAY | RESPIRATORY_TRACT | Status: DC | PRN
  Administered 2020-08-30: 2 via RESPIRATORY_TRACT
  Filled 2020-08-30: qty 6.7

## 2020-08-30 MED ORDER — SODIUM CHLORIDE 0.9 % IV SOLN
250.0000 mL | INTRAVENOUS | Status: DC | PRN
  Administered 2020-09-04: 250 mL via INTRAVENOUS

## 2020-08-30 MED ORDER — TAMSULOSIN HCL 0.4 MG PO CAPS
0.4000 mg | ORAL_CAPSULE | Freq: Every day | ORAL | Status: DC
  Administered 2020-08-30 – 2020-09-04 (×6): 0.4 mg via ORAL
  Filled 2020-08-30 (×6): qty 1

## 2020-08-30 MED ORDER — DARBEPOETIN ALFA 150 MCG/0.3ML IJ SOSY: 150.0000 ug | PREFILLED_SYRINGE | Freq: Once | INTRAMUSCULAR | Status: DC

## 2020-08-30 MED ORDER — CARVEDILOL 3.125 MG PO TABS
3.1250 mg | ORAL_TABLET | Freq: Two times a day (BID) | ORAL | Status: DC
  Administered 2020-08-30 – 2020-09-04 (×11): 3.125 mg via ORAL
  Filled 2020-08-30 (×12): qty 1

## 2020-08-30 MED ORDER — FUROSEMIDE 10 MG/ML IJ SOLN
40.0000 mg | Freq: Two times a day (BID) | INTRAMUSCULAR | Status: DC
  Administered 2020-08-30 – 2020-09-01 (×5): 40 mg via INTRAVENOUS
  Filled 2020-08-30 (×6): qty 4

## 2020-08-30 NOTE — ED Provider Notes (Signed)
[name] EMERGENCY DEPARTMENT Provider Note   CSN: [id number] Arrival date & time: 08/30/20  0355     History Chief Complaint  Patient presents with  . Shortness of Breath    [name] is a 75 y.o. male.   75 y.o. male  with past medical history relevant for CAD (s/p prior CABG), COPD, HTN, BPH and CKD V as well as chronic anemia of CKD presents to the ED with worsening shortness of breath orthopnea and dyspnea on exertion as well as generalized weakness.  States he is worried that his hemoglobin could be low.  He gets it checked on a weekly basis and was last 7.9 on April 12.  He states since then he had progressively worsening shortness of breath with generalized weakness and tightness in his chest.  Came in tonight because he was not able to sleep due to shortness of breath and feeling weak.  States he has had some rectal bleeding over the past 2 days that is new.  The stool itself is brown but mixed with red in the toilet bowl is red and red with wiping.  States he has never had rectal bleeding before.  Takes Plavix but no other anticoagulation.  Denies any abdominal pain or vomiting.  Denies any leg pain or leg swelling.  Feels tight in his chest but no chest pain.  History of heart failure as well as states compliance with his medications.  Has required blood transfusion in the past.  Believes he had a colonoscopy in 2017 that showed a polyp.  The history is provided by the patient. The history is limited by the condition of the patient.  Shortness of Breath Associated symptoms: no abdominal pain, no headaches, no rash and no vomiting        Past Medical History:  Diagnosis Date  . Acute myocardial infarction of other inferior wall, initial episode of care   . Anemia   . Anginal pain ([place])   . Anxiety   . Arthritis   . Cancer [facility])    bladder 2013 removed, has cystoscopy 10/2016, has returned x 2  . COPD (chronic obstructive pulmonary disease) ([place])   . Coronary artery  disease    Artery bypass graft Jan 2008  . Dyspnea   . Dysrhythmia   . GERD (gastroesophageal reflux disease)   . Heart murmur   . History of hiatal hernia   . Hyperlipidemia   . Hypertension   . Hypothyroidism   . PONV (postoperative nausea and vomiting)   . Sleep apnea   . Stroke ([place])   . Tobacco user     Patient Active Problem List   Diagnosis Date Noted  . Acute diastolic CHF (congestive heart failure) ([place]) 05/09/2020  . Acute exacerbation of Diastolic CHF (congestive heart failure) ([place]) 05/08/2020  . Symptomatic bradycardia with heart failure 05/08/2020  . COPD without exacerbation (HCC)---40 Pack Years 05/08/2020  . Acute respiratory failure with hypoxia ([place]) 05/08/2020  . Uremia in the setting of AKI on CKD V 02/07/2020  . CKD (chronic kidney disease), stage V ([place]) 02/07/2020  . Coronary artery disease involving native coronary artery of native heart without angina pectoris 12/16/2019  . 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 Gastroenteritis 12/16/2019  . History of bladder cancer 12/16/2019  . Hyperkalemia 12/16/2019  . (HFpEF) heart failure with preserved ejection fraction ([place]) 05/17/2019  . AKI (acute kidney injury) on CKD V 05/16/2019  . Proteinuria 03/28/2019  . Acute blood loss anemia  06/08/2018  . Anemia in chronic kidney disease 06/08/2018  . BPH (benign prostatic hyperplasia) 06/08/2018  . Coronary artery disease with unstable angina pectoris ([place]) 06/08/2018  . Elevated troponin I level 06/08/2018  . GIB (gastrointestinal bleeding) 06/07/2018  . Coronary artery disease involving autologous artery coronary bypass graft 06/29/2017  . 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 NSTEMI (non-ST elevated myocardial infarction) ([place]) 09/14/2016  . Chest pain 09/14/2016  . Obstructive sleep apnea 09/14/2016  . Acute myocardial infarction of other inferior wall, initial episode of care   . Unstable angina ([place]) 01/30/2014  . OVERWEIGHT/OBESITY 02/02/2010   . HYPERLIPIDEMIA, MIXED 01/22/2009  . OTHER CHEST PAIN 01/22/2009  . TOBACCO USER 01/17/2009  . Essential hypertension 01/17/2009    Past Surgical History:  Procedure Laterality Date  . APPENDECTOMY    . AV FISTULA PLACEMENT Right 02/11/2020   Procedure: RIGHT ARM BRACHIOCEPHALIC ARTERIOVENOUS (AV) FISTULA;  Surgeon: Angelia Mould, MD;  Location: [place];  Service: Vascular;  Laterality: Right;  . CARDIAC CATHETERIZATION    . CORONARY ARTERY BYPASS GRAFT  06/14/2006   x 5  . CORONARY ARTERY BYPASS GRAFT N/A 09/19/2016   Procedure: REDO CORONARY ARTERY BYPASS GRAFTING (CABG) x two, using right internal mammary artery and left radial artery;  Surgeon: [name], MD;  Location: [place];  Service: Open Heart Surgery;  Laterality: N/A;  . FOOT SURGERY    . HERNIA REPAIR    . LEFT HEART CATH AND CORS/GRAFTS ANGIOGRAPHY N/A 09/14/2016   Procedure: Left Heart Cath and Cors/Grafts Angiography;  Surgeon: Troy Sine, MD;  Location: [place] CV LAB;  Service: Cardiovascular;  Laterality: N/A;  . LEFT HEART CATHETERIZATION WITH CORONARY/GRAFT ANGIOGRAM N/A 01/31/2014   Procedure: LEFT HEART CATHETERIZATION WITH Beatrix Fetters;  Surgeon: Sanda Klein, MD;  Location: [place] CATH LAB;  Service: Cardiovascular;  Laterality: N/A;  . NOSE SURGERY    . PERCUTANEOUS CORONARY STENT INTERVENTION (PCI-S)  01/31/2014   Procedure: PERCUTANEOUS CORONARY STENT INTERVENTION (PCI-S);  Surgeon: Sanda Klein, MD;  Location: [facility] CATH LAB;  Service: Cardiovascular;;  . RADIAL ARTERY HARVEST Left 09/19/2016   Procedure: RADIAL ARTERY HARVEST;  Surgeon: [name], MD;  Location: [place];  Service: Open Heart Surgery;  Laterality: Left;  . TEE WITHOUT CARDIOVERSION N/A 09/19/2016   Procedure: TRANSESOPHAGEAL ECHOCARDIOGRAM (TEE);  Surgeon: [name], MD;  Location: [place];  Service: Open Heart Surgery;  Laterality: N/A;       Family History  Problem Relation Age of Onset  . Heart attack  Mother   . Heart attack Brother     Social History   Tobacco Use  . Smoking status: Former Smoker    Packs/day: 1.00    Years: 40.00    Pack years: 40.00    Types: Cigarettes    Start date: 02/20/1959    Quit date: 09/13/2016    Years since quitting: 3.9  . Smokeless tobacco: Never Used  . Tobacco comment: 10/7 2-8 cigarettes per day. cutting back- AJ  Vaping Use  . Vaping Use: Never used  Substance Use Topics  . Alcohol use: No    Alcohol/week: 0.0 standard drinks    Comment: "No, not really"  . Drug use: No    Home Medications Prior to Admission medications   Medication Sig Start Date End Date Taking? Authorizing Provider  acetaminophen (TYLENOL) 325 MG tablet Take 2 tablets (650 mg total) by mouth every 6 (six) hours as needed for mild pain. 09/23/16  Lars Pinks M, PA-C  allopurinol (ZYLOPRIM) 100 MG tablet Take 100 mg by mouth daily.  05/03/19   [provider]  amLODipine (NORVASC) 10 MG tablet Take 1 tablet (10 mg total) by mouth daily. 05/18/19   Roxan Hockey, MD  atorvastatin (LIPITOR) 80 MG tablet Take 80 mg by mouth daily.    [provider]  carvedilol (COREG) 3.125 MG tablet Take 1 tablet (3.125 mg total) by mouth 2 (two) times daily. 05/10/20 05/10/21  Debbe Odea, MD  clopidogrel (PLAVIX) 75 MG tablet Take 75 mg by mouth daily.    [provider]  epoetin alfa (EPOGEN) 20000 UNIT/ML injection Inject 20,000 Units into the skin every Monday.    [provider]  finasteride (PROSCAR) 5 MG tablet Take 5 mg by mouth daily.    [provider]  folic acid (FOLVITE) 1 MG tablet Take 1 mg by mouth daily.    [provider]  furosemide (LASIX) 40 MG tablet Take 1.5 tablets (60 mg total) by mouth 2 (two) times daily. 08/11/20   Roxan Hockey, MD  hydrALAZINE (APRESOLINE) 100 MG tablet Take 1 tablet (100 mg total) by mouth 3 (three) times daily. 05/18/19   Roxan Hockey, MD  isosorbide mononitrate (IMDUR) 120  MG 24 hr tablet Take 1 tablet (120 mg total) by mouth daily. Patient taking differently: Take 240 mg by mouth daily. 05/18/19   Emokpae, Courage, MD  KLOR-CON M20 20 MEQ tablet Take 20 mEq by mouth daily. Take one tablet Monday Wednesday and Friday 03/27/20   [provider]  levothyroxine (SYNTHROID, LEVOTHROID) 50 MCG tablet Take 50 mcg by mouth daily before breakfast.    [provider]  magnesium oxide (MAG-OX) 400 MG tablet Take 1 tablet (400 mg total) by mouth daily. 08/11/20   Roxan Hockey, MD  nitroGLYCERIN (NITROSTAT) 0.4 MG SL tablet Place 0.4 mg under the tongue every 5 (five) minutes as needed for chest pain (max 3 doses).    [provider]  Omega-3 Fatty Acids (RA FISH OIL) 1000 MG CAPS Take 2,000 mg by mouth 2 (two) times daily.    [provider]  omeprazole (PRILOSEC) 20 MG capsule Take 20 mg by mouth 2 (two) times daily. 02/14/20   [provider]  tamsulosin (FLOMAX) 0.4 MG CAPS capsule Take 0.4 mg by mouth daily.    [provider]  Vitamin D, Cholecalciferol, 25 MCG (1000 UT) CAPS Take 1,000 Units by mouth daily.    [provider]    Allergies    Cardizem cd [diltiazem hcl er beads]  Review of Systems   Review of Systems  Constitutional: Positive for fatigue. Negative for chills.  HENT: Negative for congestion and rhinorrhea.   Respiratory: Positive for chest tightness and shortness of breath.   Cardiovascular: Negative for leg swelling.  Gastrointestinal: Positive for anal bleeding and blood in stool. Negative for abdominal pain, nausea and vomiting.  Genitourinary: Negative for dysuria.  Musculoskeletal: Negative for arthralgias and myalgias.  Skin: Negative for rash.  Neurological: Positive for weakness and light-headedness. Negative for headaches.   all other systems are negative except as noted in the HPI and PMH.    Physical Exam Updated Vital Signs BP (!) 148/61 (BP Location: Right Arm)   Pulse  88   Temp 97.8 F (36.6 C) (Oral)   Resp (!) 22   Ht 5\' 9"  (1.753 m)   Wt 84 kg   SpO2 93%   BMI 27.35 kg/m   Physical  Exam Vitals and nursing note reviewed.  Constitutional:      General: He is in acute distress.     Appearance: He is well-developed. He is ill-appearing.     Comments: Dyspneic with conversation, pale appearing  HENT:     Head: Normocephalic and atraumatic.     Mouth/Throat:     Pharynx: No oropharyngeal exudate.  Eyes:     Conjunctiva/sclera: Conjunctivae normal.     Pupils: Pupils are equal, round, and reactive to light.     Comments: Conjunctival pallor  Neck:     Comments: No meningismus. Cardiovascular:     Rate and Rhythm: Normal rate and regular rhythm.     Heart sounds: Normal heart sounds. No murmur heard.   Pulmonary:     Effort: Pulmonary effort is normal. No respiratory distress.     Breath sounds: Rales present.     Comments: Basilar crackles Abdominal:     Palpations: Abdomen is soft.     Tenderness: There is no abdominal tenderness. There is no guarding or rebound.  Genitourinary:    Comments: Maroon stool on rectal exam, Hemoccult positive Musculoskeletal:        General: No tenderness. Normal range of motion.     Cervical back: Normal range of motion and neck supple.     Right lower leg: Edema present.     Left lower leg: Edema present.  Skin:    General: Skin is warm.  Neurological:     Mental Status: He is alert and oriented to person, place, and time.     Cranial Nerves: No cranial nerve deficit.     Motor: No abnormal muscle tone.     Coordination: Coordination normal.     Comments: No ataxia on finger to nose bilaterally. No pronator drift. 5/5 strength throughout. CN 2-12 intact.Equal grip strength. Sensation intact.   Psychiatric:        Behavior: Behavior normal.     ED Results / Procedures / Treatments   Labs (all labs ordered are listed, but only abnormal results are displayed) Labs Reviewed  CBC WITH  DIFFERENTIAL/PLATELET - Abnormal; Notable for the following components:      Result Value   RBC 2.94 (*)    Hemoglobin 8.4 (*)    HCT 26.6 (*)    RDW 18.1 (*)    All other components within normal limits  PROTIME-INR - Abnormal; Notable for the following components:   Prothrombin Time 15.4 (*)    All other components within normal limits  COMPREHENSIVE METABOLIC PANEL - Abnormal; Notable for the following components:   CO2 21 (*)    Glucose, Bld 106 (*)    BUN 106 (*)    Creatinine, Ser 4.29 (*)    AST 14 (*)    GFR, Estimated 14 (*)    All other components within normal limits  BRAIN NATRIURETIC PEPTIDE - Abnormal; Notable for the following components:   B Natriuretic Peptide 2,912.0 (*)    All other components within normal limits  POC OCCULT BLOOD, ED - Abnormal; Notable for the following components:   Fecal Occult Bld POSITIVE (*)    All other components within normal limits  TROPONIN I (HIGH SENSITIVITY) - Abnormal; Notable for the following components:   Troponin I (High Sensitivity) 43 (*)    All other components within normal limits  TROPONIN I (HIGH SENSITIVITY) - Abnormal; Notable for the following components:   Troponin I (High Sensitivity) 46 (*)    All other components  within normal limits  RESP PANEL BY RT-PCR (FLU A&B, COVID) [id number]  LACTIC ACID, PLASMA  TYPE AND SCREEN    EKG EKG Interpretation  Date/Time:  Sunday August 30 2020 04:14:26 EDT Ventricular Rate:  88 PR Interval:    QRS Duration: 92 QT Interval:  376 QTC Calculation: 455 R Axis:   57 Text Interpretation: Accelerated junctional rhythm Nonspecific repol abnormality, lateral leads No significant change was found Confirmed by Ezequiel Essex [phone]) on 08/30/2020 4:43:27 AM   Radiology DG Chest Portable 1 View  Result Date: 08/30/2020 CLINICAL DATA:  Shortness of breath since Friday EXAM: PORTABLE CHEST 1 VIEW COMPARISON:  08/10/2020 FINDINGS: Interstitial opacity with Awanda Mink lines and small  right pleural effusion. Cardiomegaly. Prior CABG. IMPRESSION: CHF pattern. Electronically Signed   By: Monte Fantasia M.D.   On: 08/30/2020 04:41    Procedures .Critical Care Performed by: Ezequiel Essex, MD Authorized by: Ezequiel Essex, MD   Critical care provider statement:    Critical care time (minutes):  45   Critical care was necessary to treat or prevent imminent or life-threatening deterioration of the following conditions:  Cardiac failure and renal failure (CHF, GI bleed,renal failure)   Critical care was time spent personally by me on the following activities:  Discussions with consultants, evaluation of patient's response to treatment, examination of patient, ordering and performing treatments and interventions, ordering and review of laboratory studies, ordering and review of radiographic studies, pulse oximetry, re-evaluation of patient's condition, obtaining history from patient or surrogate and review of old charts     Medications Ordered in ED Medications  albuterol (VENTOLIN HFA) 108 (90 Base) MCG/ACT inhaler 2 puff (has no administration in time range)  pantoprazole (PROTONIX) injection 40 mg (has no administration in time range)    ED Course  I have reviewed the triage vital signs and the nursing notes.  Pertinent labs & imaging results that were available during my care of the patient were reviewed by me and considered in my medical decision making (see chart for details).    MDM Rules/Calculators/A&P                         Generalized weakness and shortness of breath progressively worsening for the past 4 to 5 days.  History of CHF as well as chronic anemia.  Appears to have new GI bleed.  Hemoccult positive.  Hemoglobin stable however at 8.4.  Chest x-ray concerning for CHF and will diurese.  Hold on blood transfusion at this point given his underlying heart failure  Creatinine has worsened to 4.3 from baseline of 3.5-3.7.  BUN 106.  BNP  elevated.  Patient given IV Lasix for fluid overload and cardiorenal syndrome. Does have GI bleed as well but hemoglobin is stable blood pressure is stable. IV Protonix given.  Patient will need admission for diuresis as well as further evaluation for his GI bleed and renal failure.  Discussed with Dr. Clearence Ped. Final Clinical Impression(s) / ED Diagnoses Final diagnoses:  Acute on chronic systolic congestive heart failure (HCC)  Rectal bleeding  Uremia    Rx / DC Orders ED Discharge Orders    None       Amalia Edgecombe, Annie Main, MD 08/30/20 [phone]

## 2020-08-30 NOTE — Plan of Care (Signed)

## 2020-08-30 NOTE — H&P (Addendum)
History and Physical    [name] SNK:[id number] DOB: [birth year] DOA: 08/30/2020  PCP: [facility]   Patient coming from: home  I have personally briefly reviewed patient's old medical records in [place]  Chief Complaint: Worsening shortness of breath.  HPI: [name] is a 75 y.o. male with medical history significant of coronary artery disease (status post CABG), history of COPD (not chronically on oxygen), hypertension, chronic diastolic heart failure BPH and chronic kidney disease stage V; who presented to the hospital secondary to shortness of breath.  Patient reports that for the last 4 days or so he has been experiencing increased shortness of breath, dyspnea on exertion and orthopnea.  Patient expressed being compliant with his medications and low-sodium diet.  On presentation to ED, his oxygen saturation was below 90% on room air; stabilizing the mid 90s on 2 L supplementation. Patient reported newly seen intermittent episodes of BRBPR and some blood when he wipes himself.  Patient denies chest pain, nausea, vomiting, dysuria, hematuria, melena, hematemesis, abdominal pain, sick contacts or any other complaints.   ED Course: Elevated BNP, worsening renal function (with increased creatinine from baseline) appreciated and chest x-ray demonstrating vascular congestion and CHF pattern.  IV Lasix x1 was given in the ED and TR[facility] has been contacted to place patient in the hospital for further evaluation and management of acute on chronic diastolic heart failure with concern for cardiorenal syndrome.  Review of Systems: As per HPI otherwise all other systems reviewed and are negative.  Past Medical History:  Diagnosis Date  . Acute myocardial infarction of other inferior wall, initial episode of care   . Anemia   . Anginal pain ([place])   . Anxiety   . Arthritis   . Cancer [facility])    bladder 2013 removed, has cystoscopy 10/2016, has returned x 2  . COPD (chronic  obstructive pulmonary disease) ([place])   . Coronary artery disease    Artery bypass graft Jan 2008  . Dyspnea   . Dysrhythmia   . GERD (gastroesophageal reflux disease)   . Heart murmur   . History of hiatal hernia   . Hyperlipidemia   . Hypertension   . Hypothyroidism   . PONV (postoperative nausea and vomiting)   . Sleep apnea   . Stroke ([place])   . Tobacco user     Past Surgical History:  Procedure Laterality Date  . APPENDECTOMY    . AV FISTULA PLACEMENT Right 02/11/2020   Procedure: RIGHT ARM BRACHIOCEPHALIC ARTERIOVENOUS (AV) FISTULA;  Surgeon: Angelia Mould, MD;  Location: [place];  Service: Vascular;  Laterality: Right;  . CARDIAC CATHETERIZATION    . CORONARY ARTERY BYPASS GRAFT  06/14/2006   x 5  . CORONARY ARTERY BYPASS GRAFT N/A 09/19/2016   Procedure: REDO CORONARY ARTERY BYPASS GRAFTING (CABG) x two, using right internal mammary artery and left radial artery;  Surgeon: [name], MD;  Location: [place];  Service: Open Heart Surgery;  Laterality: N/A;  . FOOT SURGERY    . HERNIA REPAIR    . LEFT HEART CATH AND CORS/GRAFTS ANGIOGRAPHY N/A 09/14/2016   Procedure: Left Heart Cath and Cors/Grafts Angiography;  Surgeon: Troy Sine, MD;  Location: [place] CV LAB;  Service: Cardiovascular;  Laterality: N/A;  . LEFT HEART CATHETERIZATION WITH CORONARY/GRAFT ANGIOGRAM N/A 01/31/2014   Procedure: LEFT HEART CATHETERIZATION WITH Beatrix Fetters;  Surgeon: Sanda Klein, MD;  Location: [place] CATH LAB;  Service: Cardiovascular;  Laterality: N/A;  .  NOSE SURGERY    . PERCUTANEOUS CORONARY STENT INTERVENTION (PCI-S)  01/31/2014   Procedure: PERCUTANEOUS CORONARY STENT INTERVENTION (PCI-S);  Surgeon: Sanda Klein, MD;  Location: [facility] CATH LAB;  Service: Cardiovascular;;  . RADIAL ARTERY HARVEST Left 09/19/2016   Procedure: RADIAL ARTERY HARVEST;  Surgeon: [name], MD;  Location: [place];  Service: Open Heart Surgery;  Laterality: Left;  . TEE WITHOUT CARDIOVERSION  N/A 09/19/2016   Procedure: TRANSESOPHAGEAL ECHOCARDIOGRAM (TEE);  Surgeon: [name], MD;  Location: [place];  Service: Open Heart Surgery;  Laterality: N/A;    Social History  reports that he quit smoking about 3 years ago. His smoking use included cigarettes. He started smoking about 61 years ago. He has a 40.00 pack-year smoking history. He has never used smokeless tobacco. He reports that he does not drink alcohol and does not use drugs.  Allergies  Allergen Reactions  . Cardizem Cd [Diltiazem Hcl Er Beads] Palpitations    "Makes heart skip"    Family History  Problem Relation Age of Onset  . Heart attack Mother   . Heart attack Brother     Prior to Admission medications   Medication Sig Start Date End Date Taking? Authorizing Provider  acetaminophen (TYLENOL) 325 MG tablet Take 2 tablets (650 mg total) by mouth every 6 (six) hours as needed for mild pain. 09/23/16   Nani Skillern, PA-C  allopurinol (ZYLOPRIM) 100 MG tablet Take 100 mg by mouth daily.  05/03/19   [provider]  amLODipine (NORVASC) 10 MG tablet Take 1 tablet (10 mg total) by mouth daily. 05/18/19   Roxan Hockey, MD  atorvastatin (LIPITOR) 80 MG tablet Take 80 mg by mouth daily.    [provider]  carvedilol (COREG) 3.125 MG tablet Take 1 tablet (3.125 mg total) by mouth 2 (two) times daily. 05/10/20 05/10/21  Debbe Odea, MD  clopidogrel (PLAVIX) 75 MG tablet Take 75 mg by mouth daily.    [provider]  epoetin alfa (EPOGEN) 20000 UNIT/ML injection Inject 20,000 Units into the skin every Monday.    [provider]  finasteride (PROSCAR) 5 MG tablet Take 5 mg by mouth daily.    [provider]  folic acid (FOLVITE) 1 MG tablet Take 1 mg by mouth daily.    [provider]  furosemide (LASIX) 40 MG tablet Take 1.5 tablets (60 mg total) by mouth 2 (two) times daily. 08/11/20   Roxan Hockey, MD  hydrALAZINE (APRESOLINE) 100 MG tablet Take 1  tablet (100 mg total) by mouth 3 (three) times daily. 05/18/19   Roxan Hockey, MD  isosorbide mononitrate (IMDUR) 120 MG 24 hr tablet Take 1 tablet (120 mg total) by mouth daily. Patient taking differently: Take 240 mg by mouth daily. 05/18/19   Emokpae, Courage, MD  KLOR-CON M20 20 MEQ tablet Take 20 mEq by mouth daily. Take one tablet Monday Wednesday and Friday 03/27/20   [provider]  levothyroxine (SYNTHROID, LEVOTHROID) 50 MCG tablet Take 50 mcg by mouth daily before breakfast.    [provider]  magnesium oxide (MAG-OX) 400 MG tablet Take 1 tablet (400 mg total) by mouth daily. 08/11/20   Roxan Hockey, MD  nitroGLYCERIN (NITROSTAT) 0.4 MG SL tablet Place 0.4 mg under the tongue every 5 (five) minutes as needed for chest pain (max 3 doses).    [provider]  Omega-3 Fatty Acids (RA FISH OIL) 1000 MG CAPS Take 2,000 mg by mouth 2 (two) times daily.  [provider]  omeprazole (PRILOSEC) 20 MG capsule Take 20 mg by mouth 2 (two) times daily. 02/14/20   [provider]  tamsulosin (FLOMAX) 0.4 MG CAPS capsule Take 0.4 mg by mouth daily.    [provider]  Vitamin D, Cholecalciferol, 25 MCG (1000 UT) CAPS Take 1,000 Units by mouth daily.    [provider]    Physical Exam: Vitals:   08/30/20 0530 08/30/20 0600 08/30/20 0630 08/30/20 0757  BP: (!) 143/48 (!) 149/59 (!) 155/57 (!) 150/46  Pulse: 72 81 77 69  Resp: (!) 22 (!) 23 20 (!) 24  Temp:    97.9 F (36.6 C)  TempSrc:    Oral  SpO2: 96% 95% 93% 95%  Weight:      Height:        Constitutional: Reporting slight improvement in his breathing after receiving Lasix in the ED; no chest pain, no fever, no nausea, no vomiting.  2 L nasal cannula supplementation in place. Vitals:   08/30/20 0530 08/30/20 0600 08/30/20 0630 08/30/20 0757  BP: (!) 143/48 (!) 149/59 (!) 155/57 (!) 150/46  Pulse: 72 81 77 69  Resp: (!) 22 (!) 23 20 (!) 24  Temp:    97.9 F (36.6 C)   TempSrc:    Oral  SpO2: 96% 95% 93% 95%  Weight:      Height:       Eyes: PERRL, lids and conjunctivae normal; no icterus, nystagmus. ENMT: Mucous membranes are moist. Posterior pharynx clear of any exudate or lesions. Neck: normal, supple, no masses, no thyromegaly; no JVD seen Respiratory: No wheezing on examination; positive rales bilaterally, increased respiratory rate with minimal activity.  No use of accessory muscles appreciated. Cardiovascular: Regular rate and rhythm, no rubs, no gallops, no murmurs appreciated on exam. Abdomen: no tenderness, no masses palpated. No hepatosplenomegaly. Bowel sounds positive.  Musculoskeletal: No cyanosis or clubbing; 1+ edema appreciated bilaterally. Skin: no rashes, no petechiae. Neurologic: CN 2-12 grossly intact. Sensation intact, DTR normal. Strength 5/5 in all 4.  Psychiatric: Normal judgment and insight. Alert and oriented x 3. Normal mood.   Labs on Admission: I have personally reviewed following labs and imaging studies  CBC: Recent Labs  Lab 08/25/20 1407 08/30/20 0449  WBC  --  5.8  NEUTROABS  --  4.4  HGB 7.6* 8.4*  HCT  --  26.6*  MCV  --  90.5  PLT  --  347    Basic Metabolic Panel: Recent Labs  Lab 08/30/20 0449  NA 138  K 4.1  CL 104  CO2 21*  GLUCOSE 106*  BUN 106*  CREATININE 4.29*  CALCIUM 9.2    GFR: Estimated Creatinine Clearance: 15.1 mL/min (A) (by C-G formula based on SCr of 4.29 mg/dL (H)).  Liver Function Tests: Recent Labs  Lab 08/30/20 0449  AST 14*  ALT 11  ALKPHOS 75  BILITOT 1.1  PROT 7.0  ALBUMIN 3.6    Urine analysis:    Component Value Date/Time   COLORURINE YELLOW 02/09/2020 0958   APPEARANCEUR CLEAR 02/09/2020 0958   LABSPEC 1.012 02/09/2020 0958   PHURINE 5.0 02/09/2020 0958   GLUCOSEU NEGATIVE 02/09/2020 0958   HGBUR NEGATIVE 02/09/2020 0958   BILIRUBINUR NEGATIVE 02/09/2020 0958   KETONESUR NEGATIVE 02/09/2020 0958   PROTEINUR 100 (A) 02/09/2020 0958   NITRITE  NEGATIVE 02/09/2020 0958   LEUKOCYTESUR NEGATIVE 02/09/2020 0958    Radiological Exams on Admission: DG Chest Portable 1 View  Result Date: 08/30/2020  CLINICAL DATA:  Shortness of breath since Friday EXAM: PORTABLE CHEST 1 VIEW COMPARISON:  08/10/2020 FINDINGS: Interstitial opacity with Kerley lines and small right pleural effusion. Cardiomegaly. Prior CABG. IMPRESSION: CHF pattern. Electronically Signed   By: Monte Fantasia M.D.   On: 08/30/2020 04:41    EKG: Independently reviewed.  No acute ischemic changes.  Sinus rhythm.  Assessment/Plan 1-Acute respiratory failure with hypoxia (HCC) -In the setting of acute on chronic diastolic heart failure and interstitial edema -Wean oxygen supplementation as tolerated -Continue IV diuresis -Follow daily weights, strict I's and O's and low-sodium diet. -Work-up suggested further decompensation of what appears to be cardiorenal syndrome; nephrology will be consulted as patient may end up requiring hemodialysis initiation.  2-acute on chronic diastolic heart failure -Patient reports compliance with diet and diuretics prior to admission -Patient's weight is approximately 5 pounds higher than the last weight at time of discharge August 11, 2020. -As mentioned above will provide IV diuresis -Close monitoring of renal function and electrolyte -At this time he may end up requiring to be initiated on no yet initiated.  for volume management  3-acute on chronic renal failure -A stage V at baseline -Expressed already having fistula in place -He had discussed with his nephrologist as an outpatient further deterioration of his condition, but HD not initiated  -nephrology service consulted -follow volume status and urine output -continue IV diuresis -no emergent HD needed yet.  4-HYPERLIPIDEMIA, MIXED -continue statins  5-Essential hypertension -stable currently -continue current antihypertensive regimen   6-BPH (benign prostatic  hyperplasia) -no complaints of urinary retention -will continue the use of proscar and flomax  7-Coronary artery disease involving native coronary artery of native heart without angina pectoris -no CP currently -continue b-blocker, statins, Imdur, hydralazine and PRN NTG  8-COPD without exacerbation (HCC)---40 Pack Years -no wheezing on exam -continue PRN bronchodilators.  9-anemia of chronic kidney disease -Continue Aranesp -Hemoglobin stable and no transfusion needed -No signs of overt bleeding.  10-gastroesophageal for disease -Continue PPI.  11-history of hypothyroidism -Continue Synthroid.  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   DVT prophylaxis: SCD's Code Status:   Full Code Family Communication:  No family at bedside.  Patient expressed that he will contact his wife with updates. Disposition Plan:   Patient is from:  Home  Anticipated DC to:  Home  Anticipated DC date:  To be determined  Anticipated DC barriers: Volume stabilization and improvement in his respiratory status.  Consults called:  Nephrology service consulted. Admission status:  Inpatient, telemetry bed, length of stay more than 2 midnights.  Severity of Illness: Moderate severity; patient presenting with acute on chronic diastolic heart failure and concern for cardiorenal syndrome development.  Needs inpatient management for IV diuresis and probably dialysis initiation.   Barton Dubois MD [facility]  How to contact the [facility] Attending or Consulting provider [place] or covering provider during after hours [place], for this patient?   1. Check the care team in [facility] and look for a) attending/consulting [facility] provider listed and b) the [facility] team listed 2. Log into [email or web address] and  use [place]'s universal password to access. If you do not have the password, please contact the hospital operator. 3. Locate the [facility] provider you are looking for under [facility] and page to a number that you can  be directly reached. 4. If you still have difficulty reaching the provider, please page the [facility] (Director on Call) for the Hospitalists listed on amion for assistance.  08/30/2020, 8:14 AM

## 2020-08-30 NOTE — ED Triage Notes (Signed)
Pt with c/o SOB since Friday. States he comes to hospital every Tues to have his Hgb checked (was 7.9 at last check) and he has just felt more sob and weak since then.

## 2020-08-30 NOTE — ED Notes (Signed)
Pt in bed, pt talking in full sentences, pt offers no complaints at this time, states that he is ready to go upstairs.

## 2020-08-31 ENCOUNTER — Encounter (HOSPITAL_COMMUNITY): Payer: Self-pay | Admitting: Internal Medicine

## 2020-08-31 DIAGNOSIS — N19 Unspecified kidney failure: Secondary | ICD-10-CM

## 2020-08-31 DIAGNOSIS — J9601 Acute respiratory failure with hypoxia: Secondary | ICD-10-CM

## 2020-08-31 DIAGNOSIS — N185 Chronic kidney disease, stage 5: Secondary | ICD-10-CM

## 2020-08-31 DIAGNOSIS — J449 Chronic obstructive pulmonary disease, unspecified: Secondary | ICD-10-CM

## 2020-08-31 DIAGNOSIS — N400 Enlarged prostate without lower urinary tract symptoms: Secondary | ICD-10-CM

## 2020-08-31 DIAGNOSIS — I5033 Acute on chronic diastolic (congestive) heart failure: Secondary | ICD-10-CM

## 2020-08-31 DIAGNOSIS — N179 Acute kidney failure, unspecified: Secondary | ICD-10-CM

## 2020-08-31 DIAGNOSIS — E782 Mixed hyperlipidemia: Secondary | ICD-10-CM

## 2020-08-31 DIAGNOSIS — I2510 Atherosclerotic heart disease of native coronary artery without angina pectoris: Secondary | ICD-10-CM

## 2020-08-31 DIAGNOSIS — I10 Essential (primary) hypertension: Secondary | ICD-10-CM

## 2020-08-31 LAB — BASIC METABOLIC PANEL
Anion gap: 13 (ref 5–15)
BUN: 106 mg/dL — ABNORMAL HIGH (ref 8–23)
CO2: 22 mmol/L (ref 22–32)
Calcium: 9.1 mg/dL (ref 8.9–10.3)
Chloride: 102 mmol/L (ref 98–111)
Creatinine, Ser: 4.25 mg/dL — ABNORMAL HIGH (ref 0.61–1.24)
GFR, Estimated: 14 mL/min — ABNORMAL LOW (ref >60)
Glucose, Bld: 97 mg/dL (ref 70–99)
Potassium: 4 mmol/L (ref 3.5–5.1)
Sodium: 137 mmol/L (ref 135–145)

## 2020-08-31 MED ORDER — HYDROCORTISONE ACETATE 25 MG RE SUPP
25.0000 mg | Freq: Two times a day (BID) | RECTAL | Status: DC
  Administered 2020-08-31 – 2020-09-04 (×7): 25 mg via RECTAL
  Filled 2020-08-31 (×8): qty 1

## 2020-08-31 NOTE — Consult Note (Signed)
[facility] Renal Consultation Note  Requesting MD: Dyann Kief Indication for Consultation: advanced CKD-   Volume overload  HPI:  [name] is a 75 y.o. male with past medical history significant for COPD, CAD status post CABG, advanced stage V CKD followed by Dr. Theador Hawthorne status post AV fistula in September 2021.  He presented to the hospital with complaints of worsening shortness of breath-had a hospitalization in late March with the same complaint.  Patient has been here 24 hours, feels better, now on room air.  Was given IV Lasix, robust urine output is not recorded but weight is down 2 kg and subjectively he feels better.  He does admit to dysgeusia and occasional nausea at home.  He is also noted that his level of functionality has decreased.  He feels that it is likely time to initiate dialysis therapy.  He has a fistula which is ready but he opts for peritoneal dialysis.  I explained to him that peritoneal dialysis is not so easy to initiate quickly-would likely need to be done as an outpatient and true initiation would probably be about 6 weeks from now.  I did offer immediate initiation using AV fistula.  He opts to wait, and discussed with Dr. Guadalupe Dawn has an appointment on Thursday.  Hoping at that time that he can start the process to initiate peritoneal dialysis as an outpatient  Creatinine, Ser  Date/Time Value Ref Range Status  08/31/2020 06:25 AM 4.25 (H) 0.61 - 1.24 mg/dL Final  08/30/2020 04:49 AM 4.29 (H) 0.61 - 1.24 mg/dL Final  08/11/2020 06:01 AM 3.89 (H) 0.61 - 1.24 mg/dL Final  08/10/2020 05:16 AM 3.92 (H) 0.61 - 1.24 mg/dL Final  08/09/2020 04:22 AM 3.52 (H) 0.61 - 1.24 mg/dL Final  08/08/2020 08:10 AM 3.72 (H) 0.61 - 1.24 mg/dL Final  07/16/2020 02:05 PM 3.78 (H) 0.61 - 1.24 mg/dL Final  05/19/2020 02:45 PM 3.83 (H) 0.61 - 1.24 mg/dL Final  05/10/2020 03:31 AM 4.45 (H) 0.61 - 1.24 mg/dL Final  05/09/2020 08:33 AM 4.29 (H) 0.61 - 1.24 mg/dL Final   05/09/2020 02:21 AM 4.32 (H) 0.61 - 1.24 mg/dL Final  05/08/2020 12:23 PM 4.33 (H) 0.61 - 1.24 mg/dL Final  04/24/2020 11:13 AM 3.43 (H) 0.61 - 1.24 mg/dL Final  03/12/2020 02:00 PM 3.96 (H) 0.61 - 1.24 mg/dL Final  02/26/2020 09:06 AM 3.91 (H) 0.61 - 1.24 mg/dL Final  02/11/2020 08:49 AM 3.44 (H) 0.61 - 1.24 mg/dL Final  02/10/2020 08:33 AM 3.69 (H) 0.61 - 1.24 mg/dL Final  02/09/2020 10:21 AM 3.93 (H) 0.61 - 1.24 mg/dL Final  02/08/2020 04:02 AM 4.62 (H) 0.61 - 1.24 mg/dL Final  02/07/2020 11:07 AM 5.25 (H) 0.61 - 1.24 mg/dL Final  02/06/2020 01:14 PM 5.06 (H) 0.61 - 1.24 mg/dL Final  12/26/2019 01:00 PM 4.14 (H) 0.61 - 1.24 mg/dL Final  12/24/2019 12:53 PM 3.63 (H) 0.61 - 1.24 mg/dL Final  12/20/2019 11:01 AM 3.94 (H) 0.61 - 1.24 mg/dL Final  12/10/2019 03:27 PM 4.39 (H) 0.61 - 1.24 mg/dL Final  09/19/2019 10:06 AM 3.06 (H) 0.61 - 1.24 mg/dL Final  07/10/2019 10:18 AM 2.88 (H) 0.61 - 1.24 mg/dL Final  05/18/2019 05:14 AM 2.77 (H) 0.61 - 1.24 mg/dL Final  05/17/2019 06:01 AM 2.74 (H) 0.61 - 1.24 mg/dL Final  05/16/2019 10:09 AM 2.66 (H) 0.61 - 1.24 mg/dL Final  09/24/2016 02:51 AM 1.63 (H) 0.61 - 1.24 mg/dL Final  09/23/2016 03:13 AM 1.64 (H) 0.61 - 1.24 mg/dL Final  09/22/2016 06:00 AM 1.58 (H) 0.61 - 1.24 mg/dL Final  09/21/2016 02:50 AM 1.51 (H) 0.61 - 1.24 mg/dL Final  09/20/2016 04:27 PM 1.50 (H) 0.61 - 1.24 mg/dL Final  09/20/2016 04:23 PM 1.59 (H) 0.61 - 1.24 mg/dL Final  09/20/2016 02:47 AM 1.21 0.61 - 1.24 mg/dL Final  09/19/2016 09:04 PM 1.20 0.61 - 1.24 mg/dL Final  09/19/2016 09:03 PM 1.20 0.61 - 1.24 mg/dL Final  09/19/2016 02:12 PM 1.10 0.61 - 1.24 mg/dL Final  09/19/2016 01:02 PM 1.00 0.61 - 1.24 mg/dL Final  09/19/2016 12:19 PM 1.10 0.61 - 1.24 mg/dL Final  09/19/2016 10:48 AM 1.10 0.61 - 1.24 mg/dL Final  09/19/2016 10:07 AM 1.20 0.61 - 1.24 mg/dL Final  09/19/2016 09:42 AM 1.10 0.61 - 1.24 mg/dL Final  09/19/2016 08:16 AM 1.10 0.61 - 1.24 mg/dL Final   09/19/2016 04:59 AM 1.32 (H) 0.61 - 1.24 mg/dL Final  09/18/2016 02:03 AM 1.37 (H) 0.61 - 1.24 mg/dL Final  09/15/2016 04:16 AM 1.33 (H) 0.61 - 1.24 mg/dL Final  09/14/2016 03:38 AM 1.59 (H) 0.61 - 1.24 mg/dL Final  02/01/2014 03:50 AM 1.12 0.50 - 1.35 mg/dL Final  01/31/2014 04:57 AM 1.00 0.50 - 1.35 mg/dL Final     PMHx:   Past Medical History:  Diagnosis Date  . Acute myocardial infarction of other inferior wall, initial episode of care   . Anemia   . Anginal pain ([place])   . Anxiety   . Arthritis   . Cancer [facility])    bladder 2013 removed, has cystoscopy 10/2016, has returned x 2  . COPD (chronic obstructive pulmonary disease) ([place])   . Coronary artery disease    Artery bypass graft Jan 2008  . Dyspnea   . Dysrhythmia   . GERD (gastroesophageal reflux disease)   . Heart murmur   . History of hiatal hernia   . Hyperlipidemia   . Hypertension   . Hypothyroidism   . PONV (postoperative nausea and vomiting)   . Sleep apnea   . Stroke ([place])   . Tobacco user     Past Surgical History:  Procedure Laterality Date  . APPENDECTOMY    . AV FISTULA PLACEMENT Right 02/11/2020   Procedure: RIGHT ARM BRACHIOCEPHALIC ARTERIOVENOUS (AV) FISTULA;  Surgeon: Angelia Mould, MD;  Location: [place];  Service: Vascular;  Laterality: Right;  . CARDIAC CATHETERIZATION    . CORONARY ARTERY BYPASS GRAFT  06/14/2006   x 5  . CORONARY ARTERY BYPASS GRAFT N/A 09/19/2016   Procedure: REDO CORONARY ARTERY BYPASS GRAFTING (CABG) x two, using right internal mammary artery and left radial artery;  Surgeon: [name], MD;  Location: [place];  Service: Open Heart Surgery;  Laterality: N/A;  . FOOT SURGERY    . HERNIA REPAIR    . LEFT HEART CATH AND CORS/GRAFTS ANGIOGRAPHY N/A 09/14/2016   Procedure: Left Heart Cath and Cors/Grafts Angiography;  Surgeon: Troy Sine, MD;  Location: [place] CV LAB;  Service: Cardiovascular;  Laterality: N/A;  . LEFT HEART CATHETERIZATION WITH CORONARY/GRAFT  ANGIOGRAM N/A 01/31/2014   Procedure: LEFT HEART CATHETERIZATION WITH Beatrix Fetters;  Surgeon: Sanda Klein, MD;  Location: [place] CATH LAB;  Service: Cardiovascular;  Laterality: N/A;  . NOSE SURGERY    . PERCUTANEOUS CORONARY STENT INTERVENTION (PCI-S)  01/31/2014   Procedure: PERCUTANEOUS CORONARY STENT INTERVENTION (PCI-S);  Surgeon: Sanda Klein, MD;  Location: [facility] C[facility];  Service: Cardiovascular;;  . RADIAL ARTERY HARVEST Left 09/19/2016   Procedure: RADIAL ARTERY HARVEST;  Surgeon: [name], MD;  Location: [place];  Service: Open Heart Surgery;  Laterality: Left;  . TEE WITHOUT CARDIOVERSION N/A 09/19/2016   Procedure: TRANSESOPHAGEAL ECHOCARDIOGRAM (TEE);  Surgeon: [name], MD;  Location: [place];  Service: Open Heart Surgery;  Laterality: N/A;    Family Hx:  Family History  Problem Relation Age of Onset  . Heart attack Mother   . Heart attack Brother     Social History:  reports that he quit smoking about 3 years ago. His smoking use included cigarettes. He started smoking about 61 years ago. He has a 40.00 pack-year smoking history. He has never used smokeless tobacco. He reports that he does not drink alcohol and does not use drugs.  Allergies:  Allergies  Allergen Reactions  . Cardizem Cd [Diltiazem Hcl Er Beads] Palpitations    "Makes heart skip"    Medications: Prior to Admission medications   Medication Sig Start Date End Date Taking? Authorizing Provider  acetaminophen (TYLENOL) 325 MG tablet Take 2 tablets (650 mg total) by mouth every 6 (six) hours as needed for mild pain. 09/23/16  Yes Lars Pinks M, PA-C  allopurinol (ZYLOPRIM) 100 MG tablet Take 100 mg by mouth daily.  05/03/19  Yes [provider]  amLODipine (NORVASC) 10 MG tablet Take 1 tablet (10 mg total) by mouth daily. 05/18/19  Yes Emokpae, Courage, MD  atorvastatin (LIPITOR) 80 MG tablet Take 80 mg by mouth daily.   Yes [provider]  carvedilol (COREG) 3.125  MG tablet Take 1 tablet (3.125 mg total) by mouth 2 (two) times daily. 05/10/20 05/10/21 Yes Debbe Odea, MD  clopidogrel (PLAVIX) 75 MG tablet Take 75 mg by mouth daily.   Yes [provider]  finasteride (PROSCAR) 5 MG tablet Take 5 mg by mouth daily.   Yes [provider]  folic acid (FOLVITE) 1 MG tablet Take 1 mg by mouth daily.   Yes [provider]  furosemide (LASIX) 40 MG tablet Take 1.5 tablets (60 mg total) by mouth 2 (two) times daily. 08/11/20  Yes Emokpae, Courage, MD  hydrALAZINE (APRESOLINE) 100 MG tablet Take 1 tablet (100 mg total) by mouth 3 (three) times daily. 05/18/19  Yes Roxan Hockey, MD  isosorbide mononitrate (IMDUR) 120 MG 24 hr tablet Take 1 tablet (120 mg total) by mouth daily. Patient taking differently: Take 240 mg by mouth daily. 05/18/19  Yes Emokpae, Courage, MD  KLOR-CON M20 20 MEQ tablet Take 20 mEq by mouth daily. Take one tablet Monday Wednesday and Friday 03/27/20  Yes [provider]  levothyroxine (SYNTHROID, LEVOTHROID) 50 MCG tablet Take 50 mcg by mouth daily before breakfast.   Yes [provider]  magnesium oxide (MAG-OX) 400 MG tablet Take 1 tablet (400 mg total) by mouth daily. 08/11/20  Yes Emokpae, Courage, MD  Omega-3 Fatty Acids (RA FISH OIL) 1000 MG CAPS Take 2,000 mg by mouth 2 (two) times daily.   Yes [provider]  omeprazole (PRILOSEC) 20 MG capsule Take 20 mg by mouth 2 (two) times daily. 02/14/20  Yes [provider]  tamsulosin (FLOMAX) 0.4 MG CAPS capsule Take 0.4 mg by mouth daily.   Yes [provider]  Vitamin D, Cholecalciferol, 25 MCG (1000 UT) CAPS Take 1,000 Units by mouth daily.   Yes [provider]  epoetin alfa (EPOGEN) 20000 UNIT/ML injection Inject 20,000 Units into the skin every Monday. Patient not taking: No sig reported    [provider]  nitroGLYCERIN (  NITROSTAT) 0.4 MG SL tablet Place 0.4 mg under the tongue every 5 (five) minutes  as needed for chest pain (max 3 doses).    [provider]    I have reviewed the patient's current medications.  Labs:  Results for orders placed or performed during the hospital encounter of 08/30/20 (from the past 48 hour(s))  CBC with Differential/Platelet     Status: Abnormal   Collection Time: 08/30/20  4:49 AM  Result Value Ref Range   WBC 5.8 4.0 - 10.5 K/uL   RBC 2.94 (L) 4.22 - 5.81 MIL/uL   Hemoglobin 8.4 (L) 13.0 - 17.0 g/dL   HCT 26.6 (L) 39.0 - 52.0 %   MCV 90.5 80.0 - 100.0 fL   MCH 28.6 26.0 - 34.0 pg   MCHC 31.6 30.0 - 36.0 g/dL   RDW 18.1 (H) 11.5 - 15.5 %   Platelets 177 150 - 400 K/uL   nRBC 0.0 0.0 - 0.2 %   Neutrophils Relative % 74 %   Neutro Abs 4.4 1.7 - 7.7 K/uL   Lymphocytes Relative 14 %   Lymphs Abs 0.8 0.7 - 4.0 K/uL   Monocytes Relative 8 %   Monocytes Absolute 0.5 0.1 - 1.0 K/uL   Eosinophils Relative 2 %   Eosinophils Absolute 0.1 0.0 - 0.5 K/uL   Basophils Relative 1 %   Basophils Absolute 0.0 0.0 - 0.1 K/uL   Immature Granulocytes 1 %   Abs Immature Granulocytes 0.03 0.00 - 0.07 K/uL    Comment: Performed at [facility], [address], [place], [place] [address]  Type and screen [facility]     Status: None   Collection Time: 08/30/20  4:49 AM  Result Value Ref Range   ABO/RH(D) A POS    Antibody Screen NEG    Sample Expiration      09/02/2020,2359 Performed at [facility], [address], [place], [place] [address]   Protime-INR     Status: Abnormal   Collection Time: 08/30/20  4:49 AM  Result Value Ref Range   Prothrombin Time 15.4 (H) 11.4 - 15.2 seconds   INR 1.2 0.8 - 1.2    Comment: (NOTE) INR goal varies based on device and disease states. Performed at [facility], [address] [place],  [address]   Comprehensive metabolic panel     Status: Abnormal   Collection Time: 08/30/20  4:49 AM  Result Value Ref Range   Sodium 138 135 - 145 mmol/L   Potassium 4.1 3.5 - 5.1 mmol/L   Chloride 104 98 -  111 mmol/L   CO2 21 (L) 22 - 32 mmol/L   Glucose, Bld 106 (H) 70 - 99 mg/dL    Comment: Glucose reference range applies only to samples taken after fasting for at least 8 hours.   BUN 106 (H) 8 - 23 mg/dL    Comment: RESULTS CONFIRMED BY MANUAL DILUTION   Creatinine, Ser 4.29 (H) 0.61 - 1.24 mg/dL   Calcium 9.2 8.9 - 10.3 mg/dL   Total Protein 7.0 6.5 - 8.1 g/dL   Albumin 3.6 3.5 - 5.0 g/dL   AST 14 (L) 15 - 41 U/L   ALT 11 0 - 44 U/L   Alkaline Phosphatase 75 38 - 126 U/L   Total Bilirubin 1.1 0.3 - 1.2 mg/dL   GFR, Estimated 14 (L) >60 mL/min    Comment: (NOTE) Calculated using the CKD-EPI Creatinine Equation (2021)    Anion gap 13 5 -  15    Comment: Performed at [facility], [address] [place], [place] [address]  Brain natriuretic peptide     Status: Abnormal   Collection Time: 08/30/20  4:49 AM  Result Value Ref Range   B Natriuretic Peptide 2,912.0 (H) 0.0 - 100.0 pg/mL    Comment: Performed at [facility], [address] [place], [place] [address]  Troponin I (High Sensitivity)     Status: Abnormal   Collection Time: 08/30/20  4:49 AM  Result Value Ref Range   Troponin I (High Sensitivity) 43 (H) <18 ng/L    Comment: (NOTE) Elevated high sensitivity troponin I (hsTnI) values and significant  changes across serial measurements may suggest ACS but many other  chronic and acute conditions are known to elevate hsTnI results.  Refer to the Links section for chest pain algorithms and additional  guidance. Performed at [facility], [address] [place], [place] [address]   Lactic acid, plasma     Status: None   Collection Time: 08/30/20  4:49 AM  Result Value Ref Range   Lactic Acid, Venous 0.8 0.5 - 1.9 mmol/L    Comment: Performed at [facility], [address], [place], [place] [address]  POC occult blood, ED     Status: Abnormal   Collection Time: 08/30/20  4:58 AM  Result Value Ref Range   Fecal Occult Bld POSITIVE (A) NEGATIVE  Troponin I (High Sensitivity)      Status: Abnormal   Collection Time: 08/30/20  6:51 AM  Result Value Ref Range   Troponin I (High Sensitivity) 46 (H) <18 ng/L    Comment: (NOTE) Elevated high sensitivity troponin I (hsTnI) values and significant  changes across serial measurements may suggest ACS but many other  chronic and acute conditions are known to elevate hsTnI results.  Refer to the "Links" section for chest pain algorithms and additional  guidance. Performed at [facility], [address] [place], [place] [address]   Resp Panel by RT-PCR (Flu A&B, Covid) Nasopharyngeal Swab     Status: None   Collection Time: 08/30/20  6:55 AM   Specimen: Nasopharyngeal Swab; Nasopharyngeal(NP) swabs in vial transport medium  Result Value Ref Range   SARS Coronavirus 2 by RT PCR NEGATIVE NEGATIVE    Comment: (NOTE) SARS-CoV-2 target nucleic acids are NOT DETECTED.  The SARS-CoV-2 RNA is generally detectable in upper respiratory specimens during the acute phase of infection. The lowest concentration of SARS-CoV-2 viral copies this assay can detect is 138 copies/mL. A negative result does not preclude SARS-Cov-2 infection and should not be used as the sole basis for treatment or other patient management decisions. A negative result may occur with  improper specimen collection/handling, submission of specimen other than nasopharyngeal swab, presence of viral mutation(s) within the areas targeted by this assay, and inadequate number of viral copies(<138 copies/mL). A negative result must be combined with clinical observations, patient history, and epidemiological information. The expected result is Negative.  Fact Sheet for Patients:  EntrepreneurPulse.com.au  Fact Sheet for Healthcare Providers:  IncredibleEmployment.be  This test is no t yet approved or cleared by the [place] FDA and  has been authorized for detection and/or diagnosis of SARS-CoV-2 by FDA under an Emergency  Use Authorization (EUA). This EUA will remain  in effect (meaning this test can be used) for the duration of the COVID-19 declaration under Section 564(b)(1) of the Act, 21 U.S.C.section 360bbb-3(b)(1), unless the authorization is terminated  or revoked sooner.  Influenza A by PCR NEGATIVE NEGATIVE   Influenza B by PCR NEGATIVE NEGATIVE    Comment: (NOTE) The Xpert Xpress SARS-CoV-2/FLU/RSV plus assay is intended as an aid in the diagnosis of influenza from Nasopharyngeal swab specimens and should not be used as a sole basis for treatment. Nasal washings and aspirates are unacceptable for Xpert Xpress SARS-CoV-2/FLU/RSV testing.  Fact Sheet for Patients: EntrepreneurPulse.com.au  Fact Sheet for Healthcare Providers: IncredibleEmployment.be  This test is not yet approved or cleared by the [place] FDA and has been authorized for detection and/or diagnosis of SARS-CoV-2 by FDA under an Emergency Use Authorization (EUA). This EUA will remain in effect (meaning this test can be used) for the duration of the COVID-19 declaration under Section 564(b)(1) of the Act, 21 U.S.C. section 360bbb-3(b)(1), unless the authorization is terminated or revoked.  Performed at [facility], [address], [place], [place] [address]   Magnesium     Status: None   Collection Time: 08/30/20  8:03 AM  Result Value Ref Range   Magnesium 2.0 1.7 - 2.4 mg/dL    Comment: Performed at [facility], [address] [place], [place] [address]  Basic metabolic panel     Status: Abnormal (Preliminary result)   Collection Time: 08/31/20  6:25 AM  Result Value Ref Range   Sodium 137 135 - 145 mmol/L   Potassium 4.0 3.5 - 5.1 mmol/L   Chloride 102 98 - 111 mmol/L   CO2 22 22 - 32 mmol/L   Glucose, Bld 97 70 - 99 mg/dL    Comment: Glucose reference range applies only to samples taken after fasting for at least 8 hours.   BUN PENDING 8 - 23 mg/dL   Creatinine, Ser  4.25 (H) 0.61 - 1.24 mg/dL   Calcium 9.1 8.9 - 10.3 mg/dL   GFR, Estimated 14 (L) >60 mL/min    Comment: (NOTE) Calculated using the CKD-EPI Creatinine Equation (2021)    Anion gap 13 5 - 15    Comment: Performed at [facility], [address], [place], [place] [address]     ROS:  A comprehensive review of systems was negative except for: Constitutional: positive for fatigue Respiratory: positive for dyspnea on exertion Gastrointestinal: positive for nausea  Physical Exam: Vitals:   08/30/20 2058 08/31/20 0548  BP: (!) 147/50 (!) 136/56  Pulse: 75 71  Resp: 18 18  Temp: 98.1 F (36.7 C) 97.9 F (36.6 C)  SpO2: 96% 96%     General: Well appearing white male, sitting at side of bed, no acute distress HEENT: Pupils are equal round and reactive to light, extraocular motions are intact, mucous membranes are moist Neck: No JVD Heart: Regular rate and rhythm Lungs: Decreased breath sounds bilaterally-no overt rales appreciated Abdomen: Soft, nontender, nondistended Extremities: Trace to 1+ peripheral edema-  right upper arm AV fistula with good thrill and bruit Skin: Warm and dry Neuro: Alert, nonfocal  Assessment/Plan: 75 year old white male with COPD, CAD and advanced CKD.  He presented to the hospital with shortness of breath and dyspnea on exertion which has improved with IV diuresis 1.Renal-advanced CKD followed closely as an outpatient by Dr. Theador Hawthorne.  He has a fistula in place that is mature.  He is starting to have failure to thrive and has come to the conclusion that he needs to initiate dialysis.  I agree.  However, he opts for peritoneal dialysis ultimately and wants to wait on initiation of hemodialysis-to discuss with his outpatient nephrologist and hopefully can wait  for the process to initiate peritoneal dialysis as an outpatient.  There are no immediate needs for hemodialysis today so I think this is a reasonable plan 2. Hypertension/volume  -volume overload.   Improved with IV diuresis.  He was on Lasix 60 twice daily.  I would aim crease to at least 80 twice daily and he can be further fine-tuned by his outpatient nephrologist 3. Anemia  -is present and likely contributing to his shortness of breath.  He received a dose of Aranesp here in house.  If was going to stay I will check iron stores and replete iron if needed.  However, it seems that maybe he would be ready for discharge and to follow-up with his outpatient nephrologist on Thursday   Quindarius Cabello A Munachimso Palin 08/31/2020, 8:16 AM

## 2020-08-31 NOTE — Progress Notes (Signed)
Patient reports bright red blood in stool. He had already flushed specimen. Dr. Dyann Kief notified. GI consult placed and secretary to call .

## 2020-08-31 NOTE — Plan of Care (Signed)

## 2020-08-31 NOTE — Progress Notes (Signed)
PROGRESS NOTE    [name]  ELF:[id number] DOB: 26-Sep-1945 DOA: 08/30/2020  PCP: [facility]    Chief Complaint  Patient presents with  . Shortness of Breath    Brief Narrative:  [name] is a 75 y.o. male with medical history significant of coronary artery disease (status post CABG), history of COPD (not chronically on oxygen), hypertension, chronic diastolic heart failure BPH and chronic kidney disease stage V; who presented to the hospital secondary to shortness of breath.  Patient reports that for the last 4 days or so he has been experiencing increased shortness of breath, dyspnea on exertion and orthopnea.  Patient expressed being compliant with his medications and low-sodium diet.  On presentation to ED, his oxygen saturation was below 90% on room air; stabilizing the mid 90s on 2 L supplementation. Patient reported newly seen intermittent episodes of BRBPR and some blood when he wipes himself.  Patient denies chest pain, nausea, vomiting, dysuria, hematuria, melena, hematemesis, abdominal pain, sick contacts or any other complaints.   ED Course: Elevated BNP, worsening renal function (with increased creatinine from baseline) appreciated and chest x-ray demonstrating vascular congestion and CHF pattern.  IV Lasix x1 was given in the ED and [facility] has been contacted to place patient in the hospital for further evaluation and management of acute on chronic diastolic heart failure with concern for cardiorenal syndrome.  Assessment & Plan: 1-acute respiratory failure with hypoxia secondary to acute on chronic diastolic heart failure and interstitial edema. -Some improvement after diuresis reported; currently not requiring oxygen supplementation -Will continue IV diuresis -Continue to follow low-sodium diet and daily weights -Work-up suggesting cardiorenal syndrome; after discussing with patient and nephrology plan is to initiate hemodialysis as  inpatient. -Continue to follow urine output.  2-acute on chronic renal failure -Stage V at baseline -BUN/Cr continue worsening -patient in agreement with HD initiation -nephrology on board and will follow recommendations -continue low sodium diet  3-HLD -continue statins  4-anemia of chronic kidney disease -will check iron studies -another episode reported of BRBPR -GI consulted -will follow Hgb trend  -presumably internal hemorrhoids Vs AVM's -no abd pain  5-essential hypertension -Stable -Continue current antihypertensive regimen -Follow low-sodium diet.  6-history of BPH -No complaints of urinary retention -Continue Proscar and Flomax.  7-history of coronary artery disease -Remains chest pain-free -Continue beta-blocker, statins, Imdur, hydralazine and as needed nitroglycerin.  8-gastroesophageal disease -Continue PPI  9-COPD without exacerbation -Continue as needed bronchodilators  10-history of obstructive sleep apnea -Continue nightly CPAP.  11-history of hypothyroidism -Continue Synthroid.   DVT prophylaxis: SCD's Code Status: Full code Family Communication: No family at bedside. Disposition:   Status is: Inpatient  Dispo: The patient is from: Home              Anticipated d/c is to: To be determined              Patient currently no medically stable for discharge; renal function/BUN worsening.  Still expressing some weakness sensation with activity.  After discussing with patient and nephrology service plan is to proceed initiation of hemodialysis.   Difficult to place patient: no    Consultants:   Nephrology service  Gastroenterology.   Procedures:  See below for x-ray reports.   Antimicrobials:  None   Subjective: Reports improvement in his breathing.  No chest pain, no nausea, no vomiting.  Currently off oxygen supplementation.  Expressed another episode of bright red blood per rectum while moving his bowels this  morning.  Objective: Vitals:   08/30/20 2057 08/30/20 2058 08/31/20 0548 08/31/20 1338  BP: (!) 147/50 (!) 147/50 (!) 136/56 (!) 132/59  Pulse: 71 75 71 70  Resp: 20 18 18 18   Temp: 98.1 F (36.7 C) 98.1 F (36.7 C) 97.9 F (36.6 C) 97.8 F (36.6 C)  TempSrc: Oral Oral Oral Oral  SpO2: 94% 96% 96% 96%  Weight:   81.9 kg   Height:        Intake/Output Summary (Last 24 hours) at 08/31/2020 1652 Last data filed at 08/31/2020 1300 Gross per 24 hour  Intake 963 ml  Output 1000 ml  Net -37 ml   Filed Weights   08/30/20 0404 08/31/20 0548  Weight: 84 kg 81.9 kg    Examination:  General exam: Appears calm and comfortable; of oxygen supplementation; reports breathing is slightly better.  No chest pain, no nausea, no vomiting.  Patient expressed another episode of bright red blood per rectum this morning. Respiratory system: Decreased breath sounds at the bases; no wheezing, positive rhonchi bilaterally.  No using accessory muscle. Cardiovascular system: RRR, no rubs, no gallops, no JVD on exam. Gastrointestinal system: Abdomen is nondistended, soft and nontender. No organomegaly or masses felt. Normal bowel sounds heard. Central nervous system: Alert and oriented. No focal neurological deficits. Extremities: No cyanosis or clubbing; trace edema appreciated bilaterally.  Right upper extremity with fistula in place; good bruit appreciated.. Skin: No petechiae. Psychiatry: Judgement and insight appear normal. Mood & affect appropriate.     Data Reviewed: I have personally reviewed following labs and imaging studies  CBC: Recent Labs  Lab 08/25/20 1407 08/30/20 0449  WBC  --  5.8  NEUTROABS  --  4.4  HGB 7.6* 8.4*  HCT  --  26.6*  MCV  --  90.5  PLT  --  425    Basic Metabolic Panel: Recent Labs  Lab 08/30/20 0449 08/30/20 0803 08/31/20 0625  NA 138  --  137  K 4.1  --  4.0  CL 104  --  102  CO2 21*  --  22  GLUCOSE 106*  --  97  BUN 106*  --  106*  CREATININE  4.29*  --  4.25*  CALCIUM 9.2  --  9.1  MG  --  2.0  --     GFR: Estimated Creatinine Clearance: 15.2 mL/min (A) (by C-G formula based on SCr of 4.25 mg/dL (H)).  Liver Function Tests: Recent Labs  Lab 08/30/20 0449  AST 14*  ALT 11  ALKPHOS 75  BILITOT 1.1  PROT 7.0  ALBUMIN 3.6    CBG: No results for input(s): GLUCAP in the last 168 hours.   Recent Results (from the past 240 hour(s))  Resp Panel by RT-PCR (Flu A&B, Covid) Nasopharyngeal Swab     Status: None   Collection Time: 08/30/20  6:55 AM   Specimen: Nasopharyngeal Swab; Nasopharyngeal(NP) swabs in vial transport medium  Result Value Ref Range Status   SARS Coronavirus 2 by RT PCR NEGATIVE NEGATIVE Final    Comment: (NOTE) SARS-CoV-2 target nucleic acids are NOT DETECTED.  The SARS-CoV-2 RNA is generally detectable in upper respiratory specimens during the acute phase of infection. The lowest concentration of SARS-CoV-2 viral copies this assay can detect is 138 copies/mL. A negative result does not preclude SARS-Cov-2 infection and should not be used as the sole basis for treatment or other patient management decisions. A negative result may occur with  improper specimen collection/handling, submission of  specimen other than nasopharyngeal swab, presence of viral mutation(s) within the areas targeted by this assay, and inadequate number of viral copies(<138 copies/mL). A negative result must be combined with clinical observations, patient history, and epidemiological information. The expected result is Negative.  Fact Sheet for Patients:  EntrepreneurPulse.com.au  Fact Sheet for Healthcare Providers:  IncredibleEmployment.be  This test is no t yet approved or cleared by the [place] FDA and  has been authorized for detection and/or diagnosis of SARS-CoV-2 by FDA under an Emergency Use Authorization (EUA). This EUA will remain  in effect (meaning this test can be  used) for the duration of the COVID-19 declaration under Section 564(b)(1) of the Act, 21 U.S.C.section 360bbb-3(b)(1), unless the authorization is terminated  or revoked sooner.       Influenza A by PCR NEGATIVE NEGATIVE Final   Influenza B by PCR NEGATIVE NEGATIVE Final    Comment: (NOTE) The Xpert Xpress SARS-CoV-2/FLU/RSV plus assay is intended as an aid in the diagnosis of influenza from Nasopharyngeal swab specimens and should not be used as a sole basis for treatment. Nasal washings and aspirates are unacceptable for Xpert Xpress SARS-CoV-2/FLU/RSV testing.  Fact Sheet for Patients: EntrepreneurPulse.com.au  Fact Sheet for Healthcare Providers: IncredibleEmployment.be  This test is not yet approved or cleared by the [place] FDA and has been authorized for detection and/or diagnosis of SARS-CoV-2 by FDA under an Emergency Use Authorization (EUA). This EUA will remain in effect (meaning this test can be used) for the duration of the COVID-19 declaration under Section 564(b)(1) of the Act, 21 U.S.C. section 360bbb-3(b)(1), unless the authorization is terminated or revoked.  Performed at [facility], [address] [place], [place] [address]      Radiology Studies: DG Chest Portable 1 View  Result Date: 08/30/2020 CLINICAL DATA:  Shortness of breath since Friday EXAM: PORTABLE CHEST 1 VIEW COMPARISON:  08/10/2020 FINDINGS: Interstitial opacity with Awanda Mink lines and small right pleural effusion. Cardiomegaly. Prior CABG. IMPRESSION: CHF pattern. Electronically Signed   By: Monte Fantasia M.D.   On: 08/30/2020 04:41   Scheduled Meds: . allopurinol  100 mg Oral Daily  . atorvastatin  80 mg Oral Daily  . carvedilol  3.125 mg Oral BID WC  . cholecalciferol  1,000 Units Oral Daily  . finasteride  5 mg Oral Daily  . folic acid  1 mg Oral Daily  . furosemide  40 mg Intravenous Q12H  . hydrALAZINE  100 mg Oral TID  . hydrocortisone   25 mg Rectal BID  . isosorbide mononitrate  120 mg Oral Daily  . levothyroxine  50 mcg Oral QAC breakfast  . magnesium oxide  400 mg Oral Daily  . omega-3 acid ethyl esters  2,000 mg Oral BID  . pantoprazole  40 mg Oral Daily  . sodium chloride flush  3 mL Intravenous Q12H  . tamsulosin  0.4 mg Oral Daily   Continuous Infusions: . sodium chloride       LOS: 1 day    Time spent: 35 minutes    Barton Dubois, MD [facility]   To contact the attending provider between 7A-7P or the covering provider during after hours 7P-7A, please log into the web site [email or web address] and access using universal [place] password for that web site. If you do not have the password, please call the hospital operator.  08/31/2020, 4:52 PM

## 2020-08-31 NOTE — Consult Note (Addendum)
Referring Provider: Dr. Dyann Kief Primary Care Physician:  [facility] Primary Gastroenterologist:  [facility]  Date of Admission: 08/30/20 Date of Consultation: 08/31/20  Reason for Consultation:  Lower GI bleeding  HPI:  [name] is a 75 y.o. year old male who presented to the ED yesterday with shortness of breath, low-volume rectal bleeding intermittently, and admitted with acute respiratory failure with hypoxia in setting of acute on chronic heart failure, acute on chronic renal failure. Nephrology consulted due to history of advanced stage CKD for consideration of dialysis. Fistula is in place that is mature. Initially, he opted to pursue PD as outpatient; however, he now tells me he would like to pursue dialysis while inpatient. GI consulted due to rectal bleeding. 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   Patient notes low-volume intermittent hematochezia onset on Friday.Hgb 8.4 yesterday on admission. Similar to previously and review of chart Hgb anywhere from 7-9 range over past year. Iron low, ferritin elevated.  Painless. Looks like blood is on the stool instead of in the stool. No rectal burning, itching, pain. Last colonoscopy around 2019 per patient. No polyps per his report. No abdominal pain. Has had more constipation than baseline in the past 2-3 months, straining hard at times. No constipation currently. No family history of colon cancer. No family history of polyps. Takes Plavix, with last dosage on Thursday. Appears this was held on admission. Known diverticulosis on imaging previously. No melena. No NSAIDs. Only takes tylenol products.     Feels improved with shortness of breath. Feels back to baseline. Doesn't feel tired any longer. Last saw GI at the [place] around 2020. Desires to return there for procedures.   Past Medical  History:  Diagnosis Date  . Acute myocardial infarction of other inferior wall, initial episode of care   . Anemia   . Anginal pain ([place])   . Anxiety   . Arthritis   . Cancer [facility])    bladder 2013 removed, has cystoscopy 10/2016, has returned x 2  . COPD (chronic obstructive pulmonary disease) ([place])   . Coronary artery disease    Artery bypass graft Jan 2008  . Dyspnea   . Dysrhythmia   . GERD (gastroesophageal reflux disease)   . Heart murmur   . History of hiatal hernia   . Hyperlipidemia   . Hypertension   . Hypothyroidism   . PONV (postoperative nausea and vomiting)   . Sleep apnea   . Stroke ([place])   . Tobacco user     Past Surgical History:  Procedure Laterality Date  . APPENDECTOMY    . AV FISTULA PLACEMENT Right 02/11/2020   Procedure: RIGHT ARM BRACHIOCEPHALIC ARTERIOVENOUS (AV) FISTULA;  Surgeon: Angelia Mould, MD;  Location: [place];  Service: Vascular;  Laterality: Right;  . CARDIAC CATHETERIZATION    . CORONARY ARTERY BYPASS GRAFT  06/14/2006   x 5  . CORONARY ARTERY BYPASS GRAFT N/A 09/19/2016   Procedure: REDO CORONARY ARTERY BYPASS GRAFTING (CABG) x two, using right internal mammary artery and left radial artery;  Surgeon: [name], MD;  Location: [place];  Service: Open Heart Surgery;  Laterality: N/A;  . FOOT SURGERY    . HERNIA REPAIR    . LEFT HEART CATH AND CORS/GRAFTS ANGIOGRAPHY N/A 09/14/2016   Procedure: Left Heart Cath and Cors/Grafts Angiography;  Surgeon: Troy Sine,  MD;  Location: [place] CV LAB;  Service: Cardiovascular;  Laterality: N/A;  . LEFT HEART CATHETERIZATION WITH CORONARY/GRAFT ANGIOGRAM N/A 01/31/2014   Procedure: LEFT HEART CATHETERIZATION WITH Beatrix Fetters;  Surgeon: Sanda Klein, MD;  Location: [place] CATH LAB;  Service: Cardiovascular;  Laterality: N/A;  . NOSE SURGERY    . PERCUTANEOUS CORONARY STENT INTERVENTION (PCI-S)  01/31/2014   Procedure: PERCUTANEOUS CORONARY STENT INTERVENTION (PCI-S);  Surgeon:  Sanda Klein, MD;  Location: [facility] CATH LAB;  Service: Cardiovascular;;  . RADIAL ARTERY HARVEST Left 09/19/2016   Procedure: RADIAL ARTERY HARVEST;  Surgeon: [name], MD;  Location: [place];  Service: Open Heart Surgery;  Laterality: Left;  . TEE WITHOUT CARDIOVERSION N/A 09/19/2016   Procedure: TRANSESOPHAGEAL ECHOCARDIOGRAM (TEE);  Surgeon: [name], MD;  Location: [place];  Service: Open Heart Surgery;  Laterality: N/A;    Prior to Admission medications   Medication Sig Start Date End Date Taking? Authorizing Provider  acetaminophen (TYLENOL) 325 MG tablet Take 2 tablets (650 mg total) by mouth every 6 (six) hours as needed for mild pain. 09/23/16  Yes Lars Pinks M, PA-C  allopurinol (ZYLOPRIM) 100 MG tablet Take 100 mg by mouth daily.  05/03/19  Yes [provider]  amLODipine (NORVASC) 10 MG tablet Take 1 tablet (10 mg total) by mouth daily. 05/18/19  Yes Emokpae, Courage, MD  atorvastatin (LIPITOR) 80 MG tablet Take 80 mg by mouth daily.   Yes [provider]  carvedilol (COREG) 3.125 MG tablet Take 1 tablet (3.125 mg total) by mouth 2 (two) times daily. 05/10/20 05/10/21 Yes Debbe Odea, MD  clopidogrel (PLAVIX) 75 MG tablet Take 75 mg by mouth daily.   Yes [provider]  finasteride (PROSCAR) 5 MG tablet Take 5 mg by mouth daily.   Yes [provider]  folic acid (FOLVITE) 1 MG tablet Take 1 mg by mouth daily.   Yes [provider]  furosemide (LASIX) 40 MG tablet Take 1.5 tablets (60 mg total) by mouth 2 (two) times daily. 08/11/20  Yes Emokpae, Courage, MD  hydrALAZINE (APRESOLINE) 100 MG tablet Take 1 tablet (100 mg total) by mouth 3 (three) times daily. 05/18/19  Yes Roxan Hockey, MD  isosorbide mononitrate (IMDUR) 120 MG 24 hr tablet Take 1 tablet (120 mg total) by mouth daily. Patient taking differently: Take 240 mg by mouth daily. 05/18/19  Yes Emokpae, Courage, MD  KLOR-CON M20 20 MEQ tablet Take 20 mEq by mouth daily.  Take one tablet Monday Wednesday and Friday 03/27/20  Yes [provider]  levothyroxine (SYNTHROID, LEVOTHROID) 50 MCG tablet Take 50 mcg by mouth daily before breakfast.   Yes [provider]  magnesium oxide (MAG-OX) 400 MG tablet Take 1 tablet (400 mg total) by mouth daily. 08/11/20  Yes Emokpae, Courage, MD  Omega-3 Fatty Acids (RA FISH OIL) 1000 MG CAPS Take 2,000 mg by mouth 2 (two) times daily.   Yes [provider]  omeprazole (PRILOSEC) 20 MG capsule Take 20 mg by mouth 2 (two) times daily. 02/14/20  Yes [provider]  tamsulosin (FLOMAX) 0.4 MG CAPS capsule Take 0.4 mg by mouth daily.   Yes [provider]  Vitamin D, Cholecalciferol, 25 MCG (1000 UT) CAPS Take 1,000 Units by mouth daily.   Yes [provider]  epoetin alfa (EPOGEN) 20000 UNIT/ML injection Inject 20,000 Units into the skin every Monday. Patient not taking: No sig reported    [provider]  nitroGLYCERIN (NITROSTAT) 0.4 MG  SL tablet Place 0.4 mg under the tongue every 5 (five) minutes as needed for chest pain (max 3 doses).    [provider]    Current Facility-Administered Medications  Medication Dose Route Frequency Provider Last Rate Last Admin  . 0.9 %  sodium chloride infusion  250 mL Intravenous PRN Barton Dubois, MD      . acetaminophen (TYLENOL) tablet 650 mg  650 mg Oral Q4H PRN Barton Dubois, MD      . albuterol (PROVENTIL) (2.5 MG/3ML) 0.083% nebulizer solution 2.5 mg  2.5 mg Nebulization Q2H PRN Zierle-Ghosh, Asia B, DO      . allopurinol (ZYLOPRIM) tablet 100 mg  100 mg Oral Daily Barton Dubois, MD   100 mg at 08/31/20 0852  . atorvastatin (LIPITOR) tablet 80 mg  80 mg Oral Daily Barton Dubois, MD   80 mg at 08/31/20 0852  . carvedilol (COREG) tablet 3.125 mg  3.125 mg Oral BID WC Barton Dubois, MD   3.125 mg at 08/31/20 0736  . cholecalciferol (VITAMIN D3) tablet 1,000 Units  1,000 Units Oral Daily Barton Dubois, MD   1,000  Units at 08/30/20 1025  . Darbepoetin Alfa (ARANESP) injection 100 mcg  100 mcg Intravenous Once Barton Dubois, MD      . finasteride (PROSCAR) tablet 5 mg  5 mg Oral Daily Barton Dubois, MD   5 mg at 08/31/20 6237  . folic acid (FOLVITE) tablet 1 mg  1 mg Oral Daily Barton Dubois, MD   1 mg at 08/31/20 0853  . furosemide (LASIX) injection 40 mg  40 mg Intravenous Q12H Barton Dubois, MD   40 mg at 08/31/20 6283  . hydrALAZINE (APRESOLINE) tablet 100 mg  100 mg Oral TID Barton Dubois, MD   100 mg at 08/31/20 0851  . isosorbide mononitrate (IMDUR) 24 hr tablet 120 mg  120 mg Oral Daily Barton Dubois, MD   120 mg at 08/31/20 0855  . levothyroxine (SYNTHROID) tablet 50 mcg  50 mcg Oral QAC breakfast Barton Dubois, MD   50 mcg at 08/31/20 0555  . magnesium oxide (MAG-OX) tablet 400 mg  400 mg Oral Daily Barton Dubois, MD   400 mg at 08/31/20 0850  . omega-3 acid ethyl esters (LOVAZA) capsule 2,000 mg  2,000 mg Oral BID Barton Dubois, MD   2,000 mg at 08/31/20 0853  . ondansetron (ZOFRAN) injection 4 mg  4 mg Intravenous Q6H PRN Barton Dubois, MD      . pantoprazole (PROTONIX) EC tablet 40 mg  40 mg Oral Daily Barton Dubois, MD   40 mg at 08/31/20 0851  . sodium chloride flush (NS) 0.9 % injection 3 mL  3 mL Intravenous Q12H Barton Dubois, MD   3 mL at 08/31/20 0854  . sodium chloride flush (NS) 0.9 % injection 3 mL  3 mL Intravenous PRN Barton Dubois, MD      . tamsulosin [facility]) capsule 0.4 mg  0.4 mg Oral Daily Barton Dubois, MD   0.4 mg at 08/31/20 1517    Allergies as of 08/30/2020 - Review Complete 08/30/2020  Allergen Reaction Noted  . Cardizem cd [diltiazem hcl er beads] Palpitations 01/30/2014    Family History  Problem Relation Age of Onset  . Heart attack Mother   . Heart attack Brother   . Colon cancer Neg Hx   . Colon polyps Neg Hx     Social History   Socioeconomic History  . Marital status: Married    Spouse name: Not on  file  . Number of children: Not on  file  . Years of education: Not on file  . Highest education level: Not on file  Occupational History  . Not on file  Tobacco Use  . Smoking status: Former Smoker    Packs/day: 1.00    Years: 40.00    Pack years: 40.00    Types: Cigarettes    Start date: 02/20/1959    Quit date: 09/13/2016    Years since quitting: 3.9  . Smokeless tobacco: Never Used  Vaping Use  . Vaping Use: Never used  Substance and Sexual Activity  . Alcohol use: No    Alcohol/week: 0.0 standard drinks    Comment: "No, not really"  . Drug use: No  . Sexual activity: Not Currently  Other Topics Concern  . Not on file  Social History Narrative  . Not on file   Social Determinants of Health   Financial Resource Strain: Not on file  Food Insecurity: Not on file  Transportation Needs: Not on file  Physical Activity: Not on file  Stress: Not on file  Social Connections: Not on file  Intimate Partner Violence: Not on file    Review of Systems: Gen: Denies fever, chills, loss of appetite, change in weight or weight loss CV: Denies chest pain, heart palpitations, syncope, edema  Resp: Denies shortness of breath with rest, cough, wheezing GI: see HPI GU : Denies urinary burning, urinary frequency, urinary incontinence.  MS: Denies joint pain,swelling, cramping Derm: Denies rash, itching, dry skin Psych: Denies depression, anxiety,confusion, or memory loss Heme: see HPI  Physical Exam: Vital signs in last 24 hours: Temp:  [97.6 F (36.4 C)-98.1 F (36.7 C)] 97.9 F (36.6 C) (04/18 0548) Pulse Rate:  [71-76] 71 (04/18 0548) Resp:  [18-20] 18 (04/18 0548) BP: (136-150)/(50-72) 136/56 (04/18 0548) SpO2:  [94 %-97 %] 96 % (04/18 0548) Weight:  [81.9 kg] 81.9 kg (04/18 0548) Last BM Date: 08/29/20 General:   Alert,  Well-developed, well-nourished, pleasant and cooperative in NAD, sallow complexion, sitting up in chair without distress. Wife at bedside.  Head:  Normocephalic and atraumatic. Eyes:   Sclera clear, no icterus.   Conjunctiva pink. Lungs:  Clear throughout to auscultation.    Heart:  S1 S2 present with systolic murmur Abdomen:  Soft, nontender and nondistended. Normal bowel sounds, no obvious hernia, sitting up in chair Rectal:  No external hemorrhoids, fissures. DRE without mass. No discomfort with exam. No obvious prolapsing internal hemorrhoids.  Msk:  Symmetrical without gross deformities. Normal posture. Extremities:  Without  edema. Neurologic:  Alert and  oriented x4 Psych:  Alert and cooperative. Normal mood and affect.  Intake/Output from previous day: 04/17 0701 - 04/18 0700 In: 963 [P.O.:960; I.V.:3] Out: 475 [Urine:475] Intake/Output this shift: Total I/O In: 240 [P.O.:240] Out: 400 [Urine:400]  Lab Results: Recent Labs    08/30/20 0449  WBC 5.8  HGB 8.4*  HCT 26.6*  PLT 177   BMET Recent Labs    08/30/20 0449 08/31/20 0625  NA 138 137  K 4.1 4.0  CL 104 102  CO2 21* 22  GLUCOSE 106* 97  BUN 106* 106*  CREATININE 4.29* 4.25*  CALCIUM 9.2 9.1   LFT Recent Labs    08/30/20 0449  PROT 7.0  ALBUMIN 3.6  AST 14*  ALT 11  ALKPHOS 75  BILITOT 1.1   PT/INR Recent Labs    08/30/20 0449  LABPROT 15.4*  INR 1.2    Studies/Results: DG Chest  Portable 1 View  Result Date: 08/30/2020 CLINICAL DATA:  Shortness of breath since Friday EXAM: PORTABLE CHEST 1 VIEW COMPARISON:  08/10/2020 FINDINGS: Interstitial opacity with Kerley lines and small right pleural effusion. Cardiomegaly. Prior CABG. IMPRESSION: CHF pattern. Electronically Signed   By: Monte Fantasia M.D.   On: 08/30/2020 04:41    Impression: [name] is a 75 y.o. year old male who presented to the ED yesterday with shortness of breath, low-volume rectal bleeding intermittently, and admitted with acute respiratory failure with hypoxia in setting of acute on chronic heart failure, acute on chronic renal failure. GI consulted due to rectal bleeding. Nephrology is on board  with consideration for initiating dialysis while inpatient; he has been followed as outpatient closely by Nephrology.  Rectal bleeding: appears to be low-volume and not hemodynamically significant. Hgb on admission similar to prior values, ranging between 7-9 historically. Query benign anorectal source. DRE without mass or pain. Last colonoscopy approximately 2019 without polyps. Known diverticula on imaging but not consistent with diverticular bleeding. He desires to pursue colonoscopy as outpatient. As he has not had significant drop in Hgb and appears low-volume, this is certainly reasonable. He desires to follow-up with [facility].   Anemia: chronic disease. Stable. Updated ferritin and iron studies have already been ordered for tomorrow. In the past, iron has been low with ferritin elevated. Appears Plavix has been on hold since admission.    Plan: Anusol suppositories BID If significant GI bleeding and/or worsening anemia, recommend colonoscopy while inpatient. Otherwise, patient desires to follow-up with [facility] for colonoscopy Avoid constipation. Limit toilet time to 2-3 minutes. Avoid straining. All discussed with patient. CBC in am   Annitta Needs, PhD, ANP-BC [facility]     LOS: 1 day    08/31/2020, 11:47 AM

## 2020-08-31 NOTE — Social Work (Signed)
[place] notification done [phone]

## 2020-09-01 ENCOUNTER — Encounter (HOSPITAL_COMMUNITY): Admission: RE | Admit: 2020-09-01 | Payer: Medicare Other | Source: Ambulatory Visit

## 2020-09-01 DIAGNOSIS — K625 Hemorrhage of anus and rectum: Secondary | ICD-10-CM

## 2020-09-01 LAB — IRON AND TIBC
Iron: 21 ug/dL — ABNORMAL LOW (ref 45–182)
Saturation Ratios: 10 % — ABNORMAL LOW (ref 17.9–39.5)
TIBC: 204 ug/dL — ABNORMAL LOW (ref 250–450)
UIBC: 183 ug/dL

## 2020-09-01 LAB — CBC
HCT: 21.6 % — ABNORMAL LOW (ref 39.0–52.0)
Hemoglobin: 6.7 g/dL — CL (ref 13.0–17.0)
MCH: 28 pg (ref 26.0–34.0)
MCHC: 31 g/dL (ref 30.0–36.0)
MCV: 90.4 fL (ref 80.0–100.0)
Platelets: 170 10*3/uL (ref 150–400)
RBC: 2.39 MIL/uL — ABNORMAL LOW (ref 4.22–5.81)
RDW: 17.6 % — ABNORMAL HIGH (ref 11.5–15.5)
WBC: 4.5 10*3/uL (ref 4.0–10.5)
nRBC: 0 % (ref 0.0–0.2)

## 2020-09-01 LAB — BASIC METABOLIC PANEL
Anion gap: 11 (ref 5–15)
BUN: 116 mg/dL — ABNORMAL HIGH (ref 8–23)
CO2: 24 mmol/L (ref 22–32)
Calcium: 8.9 mg/dL (ref 8.9–10.3)
Chloride: 103 mmol/L (ref 98–111)
Creatinine, Ser: 4.32 mg/dL — ABNORMAL HIGH (ref 0.61–1.24)
GFR, Estimated: 14 mL/min — ABNORMAL LOW (ref >60)
Glucose, Bld: 103 mg/dL — ABNORMAL HIGH (ref 70–99)
Potassium: 4.1 mmol/L (ref 3.5–5.1)
Sodium: 138 mmol/L (ref 135–145)

## 2020-09-01 LAB — HEPATITIS B SURFACE ANTIGEN: Hepatitis B Surface Ag: NONREACTIVE

## 2020-09-01 LAB — PREPARE RBC (CROSSMATCH)

## 2020-09-01 LAB — PHOSPHORUS: Phosphorus: 4.7 mg/dL — ABNORMAL HIGH (ref 2.5–4.6)

## 2020-09-01 LAB — HEPATITIS B CORE ANTIBODY, TOTAL: Hep B Core Total Ab: NONREACTIVE

## 2020-09-01 LAB — FERRITIN: Ferritin: 416 ng/mL — ABNORMAL HIGH (ref 24–336)

## 2020-09-01 LAB — HEMOGLOBIN AND HEMATOCRIT, BLOOD
HCT: 26.5 % — ABNORMAL LOW (ref 39.0–52.0)
Hemoglobin: 8.7 g/dL — ABNORMAL LOW (ref 13.0–17.0)

## 2020-09-01 MED ORDER — SODIUM CHLORIDE 0.9 % IV SOLN: 100.0000 mL | INTRAVENOUS | Status: DC | PRN

## 2020-09-01 MED ORDER — SODIUM CHLORIDE 0.9% IV SOLUTION: Freq: Once | INTRAVENOUS | Status: AC

## 2020-09-01 MED ORDER — PENTAFLUOROPROP-TETRAFLUOROETH EX AERO: 1.0000 "application " | INHALATION_SPRAY | CUTANEOUS | Status: DC | PRN

## 2020-09-01 MED ORDER — LIDOCAINE-PRILOCAINE 2.5-2.5 % EX CREA: 1.0000 "application " | TOPICAL_CREAM | CUTANEOUS | Status: DC | PRN

## 2020-09-01 MED ORDER — PEG 3350-KCL-NA BICARB-NACL 420 G PO SOLR
4000.0000 mL | Freq: Once | ORAL | Status: AC
  Administered 2020-09-01: 4000 mL via ORAL

## 2020-09-01 MED ORDER — SODIUM CHLORIDE 0.9 % IV SOLN
125.0000 mg | Freq: Every day | INTRAVENOUS | Status: DC
  Administered 2020-09-01 – 2020-09-03 (×3): 125 mg via INTRAVENOUS
  Filled 2020-09-01 (×5): qty 10

## 2020-09-01 MED ORDER — HEPARIN SODIUM (PORCINE) 1000 UNIT/ML DIALYSIS: 1000.0000 [IU] | INTRAMUSCULAR | Status: DC | PRN

## 2020-09-01 MED ORDER — LIDOCAINE HCL (PF) 1 % IJ SOLN: 5.0000 mL | INTRAMUSCULAR | Status: DC | PRN

## 2020-09-01 MED ORDER — CHLORHEXIDINE GLUCONATE CLOTH 2 % EX PADS
6.0000 | MEDICATED_PAD | Freq: Every day | CUTANEOUS | Status: DC
  Administered 2020-09-01 – 2020-09-02 (×2): 6 via TOPICAL

## 2020-09-01 MED ORDER — ALTEPLASE 2 MG IJ SOLR: 2.0000 mg | Freq: Once | INTRAMUSCULAR | Status: DC | PRN

## 2020-09-01 NOTE — Procedures (Signed)
   INITIAL HEMODIALYSIS TREATMENT NOTE:  First ever hemodialysis treatment performed via right upper arm AVF (17g/antegrade) without any problems.  Goal met: 1 liter removed without interruption in ultrafiltration.  Hemodynamically stable throughout session.  All blood was returned and hemostasis was achieved in 15 minutes.  HBsAg, HBsAb, and HB-core labs were collected prior to this treatment.  Rockwell Alexandria, RN

## 2020-09-01 NOTE — Progress Notes (Signed)
Pt refused to wear CPAP for tonight due to frequently going to restroom. Nurse informed

## 2020-09-01 NOTE — Progress Notes (Signed)
Subjective:  No new complaints.  Pt now thinking it would be best to start HD now via AVF and he can transition to PD if still desired as OP- lives in [place]-  Will need HD set up as OP now  Objective Vital signs in last 24 hours: Vitals:   08/31/20 2046 08/31/20 2347 09/01/20 0500 09/01/20 0826  BP: (!) 133/47   (!) 128/38  Pulse: 63 73  (!) 58  Resp: 16 18    Temp: 97.7 F (36.5 C)   98 F (36.7 C)  TempSrc: Oral   Oral  SpO2: 98% 95%  97%  Weight:   81.7 kg   Height:       Weight change: -0.181 kg  Intake/Output Summary (Last 24 hours) at 09/01/2020 0841 Last data filed at 09/01/2020 0600 Gross per 24 hour  Intake 960 ml  Output 2250 ml  Net -1290 ml    Assessment/Plan: 75 year old white male with COPD, CAD and advanced CKD.  He presented to the hospital with shortness of breath and dyspnea on exertion which has improved with IV diuresis 1.Renal- advanced CKD followed closely as an outpatient by Dr. Theador Hawthorne.  He has a fistula in place that is mature.  He is starting to have failure to thrive and has come to the conclusion that he needs to initiate dialysis.  I agree.  He initially wanted to wait to initiate PD as OP but now thinks that it would be best to start HD in house.  We will plan for first HD today here and will contact the SW to start the process to look for OP HD spot in [place].   2. Hypertension/volume  -volume overload.  Improved with IV diuresis. Improved-  Will look to maintain volume with HD 3. Anemia  -is present and likely contributing to his shortness of breath.  He received a dose of Aranesp. Iron stores low, will replete iron as well.  Hgb 6.7 this AM- will give blood with HD as well  4. Bones- no recent phos or PTH data- will order and act as needed   [name]    Labs: Basic Metabolic Panel: Recent Labs  Lab 08/30/20 0449 08/31/20 0625 09/01/20 0517  NA 138 137 138  K 4.1 4.0 4.1  CL 104 102 103  CO2 21* 22 24  GLUCOSE 106* 97 103*  BUN  106* 106* 116*  CREATININE 4.29* 4.25* 4.32*  CALCIUM 9.2 9.1 8.9   Liver Function Tests: Recent Labs  Lab 08/30/20 0449  AST 14*  ALT 11  ALKPHOS 75  BILITOT 1.1  PROT 7.0  ALBUMIN 3.6   No results for input(s): LIPASE, AMYLASE in the last 168 hours. No results for input(s): AMMONIA in the last 168 hours. CBC: Recent Labs  Lab 08/25/20 1407 08/30/20 0449 09/01/20 0517  WBC  --  5.8 4.5  NEUTROABS  --  4.4  --   HGB 7.6* 8.4* 6.7*  HCT  --  26.6* 21.6*  MCV  --  90.5 90.4  PLT  --  177 170   Cardiac Enzymes: No results for input(s): CKTOTAL, CKMB, CKMBINDEX, TROPONINI in the last 168 hours. CBG: No results for input(s): GLUCAP in the last 168 hours.  Iron Studies:  Recent Labs    09/01/20 0517  IRON 21*  TIBC 204*  FERRITIN 416*   Studies/Results: No results found. Medications: Infusions: . sodium chloride      Scheduled Medications: . allopurinol  100 mg Oral  Daily  . atorvastatin  80 mg Oral Daily  . carvedilol  3.125 mg Oral BID WC  . cholecalciferol  1,000 Units Oral Daily  . finasteride  5 mg Oral Daily  . folic acid  1 mg Oral Daily  . furosemide  40 mg Intravenous Q12H  . hydrALAZINE  100 mg Oral TID  . hydrocortisone  25 mg Rectal BID  . isosorbide mononitrate  120 mg Oral Daily  . levothyroxine  50 mcg Oral QAC breakfast  . magnesium oxide  400 mg Oral Daily  . omega-3 acid ethyl esters  2,000 mg Oral BID  . pantoprazole  40 mg Oral Daily  . sodium chloride flush  3 mL Intravenous Q12H  . tamsulosin  0.4 mg Oral Daily    have reviewed scheduled and prn medications.  Physical Exam: General: sitting up at bedside , NAD Heart: RRR Lungs:mostly clear Abdomen: soft, non tender Extremities: really minimal edema Dialysis Access: right upper arm AVF-  Good thrill and bruit     09/01/2020,8:41 AM  LOS: 2 days

## 2020-09-01 NOTE — Progress Notes (Signed)
Alert and oriented x 4. Ambulates self in room. Denies pain or discomfort. Monitoring rectal bleeding. Annusol suppository given ah HS. Th AM lab called to report low HG of 6.7 MD notified.

## 2020-09-01 NOTE — Progress Notes (Addendum)
Subjective: After suppository yesterday, he had gas pains in his stomach. Total of 3 BMs within 3 hours. Stools were mushy. Prior to this, he was having formed BMs. No straining lately. Continues with bright red blood with every BM. The last BM was a little less blood. No BM today. No lightheadedness, dizziness, pre-syncope or syncope.   Last dose of Plavix was Friday 4/15.  Not sure if he would like to have a colonoscopy. States he needs some time to think about this.   Objective: Vital signs in last 24 hours: Temp:  [97.7 F (36.5 C)-98 F (36.7 C)] 98 F (36.7 C) (04/19 0826) Pulse Rate:  [58-73] 58 (04/19 0826) Resp:  [16-18] 18 (04/18 2347) BP: (128-133)/(38-59) 128/38 (04/19 0826) SpO2:  [95 %-98 %] 97 % (04/19 0826) Weight:  [81.7 kg] 81.7 kg (04/19 0500) Last BM Date: 08/29/20 General:   Alert and oriented, pleasant, sitting in recliner.  Head:  Normocephalic and atraumatic. Eyes:  No icterus, sclera clear. Conjuctiva pink.  Abdomen:  Bowel sounds present, soft, non-tender, non-distended. No HSM or hernias noted. No rebound or guarding. No masses appreciated  Extremities:  With 1-2+ pitting edema in RLE. No significant edema in LLE. Per patient this is chronic.  Neurologic:  Alert and  oriented x4;  grossly normal neurologically. Psych:  Normal mood and affect.  Intake/Output from previous day: 04/18 0701 - 04/19 0700 In: 960 [P.O.:960] Out: 2250 [Urine:2250] Intake/Output this shift: No intake/output data recorded.  Lab Results: Recent Labs    08/30/20 0449 09/01/20 0517  WBC 5.8 4.5  HGB 8.4* 6.7*  HCT 26.6* 21.6*  PLT 177 170   BMET Recent Labs    08/30/20 0449 08/31/20 0625 09/01/20 0517  NA 138 137 138  K 4.1 4.0 4.1  CL 104 102 103  CO2 21* 22 24  GLUCOSE 106* 97 103*  BUN 106* 106* 116*  CREATININE 4.29* 4.25* 4.32*  CALCIUM 9.2 9.1 8.9   LFT Recent Labs    08/30/20 0449  PROT 7.0  ALBUMIN 3.6  AST 14*  ALT 11  ALKPHOS 75   BILITOT 1.1   PT/INR Recent Labs    08/30/20 0449  LABPROT 15.4*  INR 1.2    Assessment: [name] is a 75 y.o. year old male who presented to the ED yesterday with shortness of breath, low-volume rectal bleeding intermittently, and admitted with acute respiratory failure with hypoxia in setting of acute on chronic heart failure, acute on chronic renal failure. GI consulted due to rectal bleeding. Nephrology is on board with consideration for initiating dialysis while inpatient; he has been followed as outpatient closely by Nephrology.   Rectal bleeding: New onset Friday 4/15 with bright red blood "on the stool" with each BM. Denies recent constipation or straining. Last colonoscopy 2019 without polyps. Known diverticula on imaging. Hemoglobin on admission 8.4, similar to prior values as outpatient.  Hemoglobin this morning has declined to 6.7.  Reports he has continued with bright red blood per rectum with each bowel movement since presenting to the hospital.  3 total episodes of rectal bleeding yesterday.  Stools were loose yesterday after receiving Anusol suppository.  No BM this morning.  Patient had requested to follow-up with [facility] for colonoscopy; however, in light of decline in hemoglobin this admission and ongoing rectal bleeding, I feel it would be appropriate to proceed with colonoscopy at this time.  I discussed this with patient; however, he states he would like more  time to think about this.  We will check back with him this afternoon.  I will start him on clear liquids in the instance we do prep him for colonoscopy.  Notably, last dose of Plavix was on Friday 4/15.  Differentials include colon polyps, AMVs, hemorrhoidal bleeding, malignancy, or diverticulitis thought not consistent with this.   Anemia: Multifactorial in the setting of GI bleeding and chronic disease.  Decline in hemoglobin this admission secondary to GI bleeding as per above.  Iron studies completed  with ferritin elevated at 416, iron 21, saturation 10%.  Last Feraheme in February 2022.  He receives Epogen routinely.  Lower extremity edema: Asymmetric, R>L.  Per patient, this is chronic.  I have notified Dr. Dyann Vance.  Plan: 1.  Recommended colonoscopy inpatient due to ongoing rectal bleeding and decline in hemoglobin.  Patient has requested to think about this further.  We will check back with him this afternoon. *Plavix has been on hold this admission. Last dose per patient, Friday 4/15. 2.  Start clear liquids in the instance we prepped for colonoscopy later today. 3.  Agree with blood transfusion.  1 unit PRBCs has been ordered by hospitalist. 4.  Continue to monitor for ongoing GI bleeding. 5.  Continue to follow H/H.  6.  Notify Dr. Dyann Vance about asymmetric lower extremity edema.    LOS: 2 days    09/01/2020, 8:48 AM   Aliene Altes, PA-C [facility]

## 2020-09-01 NOTE — Progress Notes (Signed)
PROGRESS NOTE    [name]  IDP:[id number] DOB: 01/13/1946 DOA: 08/30/2020  PCP: [facility]    Chief Complaint  Patient presents with  . Shortness of Breath    Brief Narrative:  [name] is a 75 y.o. male with medical history significant of coronary artery disease (status post CABG), history of COPD (not chronically on oxygen), hypertension, chronic diastolic heart failure BPH and chronic kidney disease stage V; who presented to the hospital secondary to shortness of breath.  Patient reports that for the last 4 days or so he has been experiencing increased shortness of breath, dyspnea on exertion and orthopnea.  Patient expressed being compliant with his medications and low-sodium diet.  On presentation to ED, his oxygen saturation was below 90% on room air; stabilizing the mid 90s on 2 L supplementation. Patient reported newly seen intermittent episodes of BRBPR and some blood when he wipes himself.  Patient denies chest pain, nausea, vomiting, dysuria, hematuria, melena, hematemesis, abdominal pain, sick contacts or any other complaints.   ED Course: Elevated BNP, worsening renal function (with increased creatinine from baseline) appreciated and chest x-ray demonstrating vascular congestion and CHF pattern.  IV Lasix x1 was given in the ED and [facility] has been contacted to place patient in the hospital for further evaluation and management of acute on chronic diastolic heart failure with concern for cardiorenal syndrome.  Assessment & Plan: 1-acute respiratory failure with hypoxia secondary to acute on chronic diastolic heart failure and interstitial edema. -No requiring oxygen supplementation -Decreasing urine output with worsening renal function and elevation on BUN. -For now continue with IV diuresis -Continue to follow low-sodium diet and daily weights -Work-up suggesting cardiorenal syndrome; after discussing with patient and nephrology plan is to initiate  hemodialysis as inpatient, first treatment today.  2-acute on chronic renal failure -Stage V at baseline -BUN/Cr continue worsening; patient also noticing decreasing urine output. -patient in agreement with HD initiation; first hemodialysis treatment will be provided today as per nephrology recommendations; TOC working with outpatient placement. -nephrology on board and will follow recommendations -continue low sodium diet  3-HLD -continue statins  4-anemia of chronic kidney disease/with component of cute blood loss anemia. -Hemoglobin today 6.3 -1 unit PRBCs with dialysis to be given -GI service consulted and planning colonoscopy evaluation. -Clear liquid diet has been ordered in anticipation for bowel prep by GI. -Continue to follow hemoglobin trend -presumably internal hemorrhoids Vs AVM's -no abd pain -IV iron and Epogen as per nephrology discretion  5-essential hypertension -Stable -Continue current antihypertensive regimen -Follow low-sodium diet.  6-history of BPH -No complaints of urinary retention -Continue Proscar and Flomax.  7-history of coronary artery disease -Remains chest pain-free -Continue beta-blocker, statins, Imdur, hydralazine and as needed nitroglycerin.  8-gastroesophageal disease -Continue PPI  9-COPD without exacerbation -Continue as needed bronchodilators  10-history of obstructive sleep apnea -Continue nightly CPAP.  11-history of hypothyroidism -Continue Synthroid.   DVT prophylaxis: SCD's Code Status: Full code Family Communication: No family at bedside. Disposition:   Status is: Inpatient  Dispo: The patient is from: Home              Anticipated d/c is to: To be determined              Patient currently no medically stable for discharge; renal function/BUN worsening.  Still expressing some weakness sensation with activity.  After discussing with patient and nephrology service plan is to proceed initiation of hemodialysis.    Difficult to place patient:  no    Consultants:   Nephrology service  Gastroenterology.   Procedures:  See below for x-ray reports.   Antimicrobials:  None   Subjective: 2 more episodes of bright red blood per rectum overnight; reports feeling weak.  Not requiring oxygen for mentation.  Denies chest pain.  Patient noticed a decrease in urine output (Per I's and O's only 850 mL in the last 24 hours).  Objective: Vitals:   09/01/20 1345 09/01/20 1400 09/01/20 1430 09/01/20 1500  BP: (!) 131/59 (!) 142/55 (!) 133/57 (!) 141/57  Pulse: 70 71 67 68  Resp:      Temp:      TempSrc:      SpO2:      Weight:      Height:        Intake/Output Summary (Last 24 hours) at 09/01/2020 1644 Last data filed at 09/01/2020 1325 Gross per 24 hour  Intake 1046 ml  Output 1900 ml  Net -854 ml   Filed Weights   08/30/20 0404 08/31/20 0548 09/01/20 0500  Weight: 84 kg 81.9 kg 81.7 kg    Examination: General exam: Afebrile, no chest pain, no nausea or vomiting.  Patient expressed improvement in his breathing.  Still short winded with activity.  2 more episodes of large amount of bright red blood per rectum overnight.  Expressed feeling weak. Respiratory system: Decreased breath sounds at the bases; no frank crackles, no wheezing appreciated.  Currently no use in oxygen supplementation. Cardiovascular system:RRR. No murmurs, rubs, gallops.  No JVD on exam. Gastrointestinal system: Abdomen is nondistended, soft and nontender. No organomegaly or masses felt. Normal bowel sounds heard. Central nervous system: Alert and oriented. No focal neurological deficits. Extremities: No cyanosis or clubbing; trace to 1+ edema appreciated bilaterally; right more than left, patient expressed to be chronic).  No pain on palpation. Skin: No petechiae. Psychiatry: Judgement and insight appear normal. Mood & affect appropriate.    Data Reviewed: I have personally reviewed following labs and imaging  studies  CBC: Recent Labs  Lab 08/30/20 0449 09/01/20 0517  WBC 5.8 4.5  NEUTROABS 4.4  --   HGB 8.4* 6.7*  HCT 26.6* 21.6*  MCV 90.5 90.4  PLT 177 440    Basic Metabolic Panel: Recent Labs  Lab 08/30/20 0449 08/30/20 0803 08/31/20 0625 09/01/20 0517 09/01/20 1422  NA 138  --  137 138  --   K 4.1  --  4.0 4.1  --   CL 104  --  102 103  --   CO2 21*  --  22 24  --   GLUCOSE 106*  --  97 103*  --   BUN 106*  --  106* 116*  --   CREATININE 4.29*  --  4.25* 4.32*  --   CALCIUM 9.2  --  9.1 8.9  --   MG  --  2.0  --   --   --   PHOS  --   --   --   --  4.7*    GFR: Estimated Creatinine Clearance: 15 mL/min (A) (by C-G formula based on SCr of 4.32 mg/dL (H)).  Liver Function Tests: Recent Labs  Lab 08/30/20 0449  AST 14*  ALT 11  ALKPHOS 75  BILITOT 1.1  PROT 7.0  ALBUMIN 3.6    CBG: No results for input(s): GLUCAP in the last 168 hours.   Recent Results (from the past 240 hour(s))  Resp Panel by RT-PCR (Flu A&B, Covid) Nasopharyngeal  Swab     Status: None   Collection Time: 08/30/20  6:55 AM   Specimen: Nasopharyngeal Swab; Nasopharyngeal(NP) swabs in vial transport medium  Result Value Ref Range Status   SARS Coronavirus 2 by RT PCR NEGATIVE NEGATIVE Final    Comment: (NOTE) SARS-CoV-2 target nucleic acids are NOT DETECTED.  The SARS-CoV-2 RNA is generally detectable in upper respiratory specimens during the acute phase of infection. The lowest concentration of SARS-CoV-2 viral copies this assay can detect is 138 copies/mL. A negative result does not preclude SARS-Cov-2 infection and should not be used as the sole basis for treatment or other patient management decisions. A negative result may occur with  improper specimen collection/handling, submission of specimen other than nasopharyngeal swab, presence of viral mutation(s) within the areas targeted by this assay, and inadequate number of viral copies(<138 copies/mL). A negative result must be  combined with clinical observations, patient history, and epidemiological information. The expected result is Negative.  Fact Sheet for Patients:  EntrepreneurPulse.com.au  Fact Sheet for Healthcare Providers:  IncredibleEmployment.be  This test is no t yet approved or cleared by the [place] FDA and  has been authorized for detection and/or diagnosis of SARS-CoV-2 by FDA under an Emergency Use Authorization (EUA). This EUA will remain  in effect (meaning this test can be used) for the duration of the COVID-19 declaration under Section 564(b)(1) of the Act, 21 U.S.C.section 360bbb-3(b)(1), unless the authorization is terminated  or revoked sooner.       Influenza A by PCR NEGATIVE NEGATIVE Final   Influenza B by PCR NEGATIVE NEGATIVE Final    Comment: (NOTE) The Xpert Xpress SARS-CoV-2/FLU/RSV plus assay is intended as an aid in the diagnosis of influenza from Nasopharyngeal swab specimens and should not be used as a sole basis for treatment. Nasal washings and aspirates are unacceptable for Xpert Xpress SARS-CoV-2/FLU/RSV testing.  Fact Sheet for Patients: EntrepreneurPulse.com.au  Fact Sheet for Healthcare Providers: IncredibleEmployment.be  This test is not yet approved or cleared by the [place] FDA and has been authorized for detection and/or diagnosis of SARS-CoV-2 by FDA under an Emergency Use Authorization (EUA). This EUA will remain in effect (meaning this test can be used) for the duration of the COVID-19 declaration under Section 564(b)(1) of the Act, 21 U.S.C. section 360bbb-3(b)(1), unless the authorization is terminated or revoked.  Performed at [facility], [address], [place], [place] [address]      Radiology Studies: No results found. Scheduled Meds: . allopurinol  100 mg Oral Daily  . atorvastatin  80 mg Oral Daily  . carvedilol  3.125 mg Oral BID WC  .  Chlorhexidine Gluconate Cloth  6 each Topical Q0600  . cholecalciferol  1,000 Units Oral Daily  . finasteride  5 mg Oral Daily  . folic acid  1 mg Oral Daily  . furosemide  40 mg Intravenous Q12H  . hydrALAZINE  100 mg Oral TID  . hydrocortisone  25 mg Rectal BID  . isosorbide mononitrate  120 mg Oral Daily  . levothyroxine  50 mcg Oral QAC breakfast  . magnesium oxide  400 mg Oral Daily  . omega-3 acid ethyl esters  2,000 mg Oral BID  . pantoprazole  40 mg Oral Daily  . sodium chloride flush  3 mL Intravenous Q12H  . tamsulosin  0.4 mg Oral Daily   Continuous Infusions: . sodium chloride    . sodium chloride    . sodium chloride    . ferric gluconate (FERRLECIT/NULECIT)  IV 125 mg (09/01/20 1407)     LOS: 2 days    Time spent: 41 minutes   Barton Dubois, MD [facility]   To contact the attending provider between 7A-7P or the covering provider during after hours 7P-7A, please log into the web site [email or web address] and access using universal [place] password for that web site. If you do not have the password, please call the hospital operator.  09/01/2020, 4:44 PM

## 2020-09-02 ENCOUNTER — Inpatient Hospital Stay (HOSPITAL_COMMUNITY): Payer: No Typology Code available for payment source | Admitting: Anesthesiology

## 2020-09-02 ENCOUNTER — Encounter (HOSPITAL_COMMUNITY): Payer: Self-pay | Admitting: Internal Medicine

## 2020-09-02 ENCOUNTER — Encounter (HOSPITAL_COMMUNITY)
Admission: EM | Disposition: A | Discharge status: Home / Self Care | Payer: Self-pay | Source: Home / Self Care | Attending: Internal Medicine

## 2020-09-02 DIAGNOSIS — K5730 Diverticulosis of large intestine without perforation or abscess without bleeding: Secondary | ICD-10-CM

## 2020-09-02 DIAGNOSIS — I5031 Acute diastolic (congestive) heart failure: Secondary | ICD-10-CM

## 2020-09-02 DIAGNOSIS — N186 End stage renal disease: Secondary | ICD-10-CM

## 2020-09-02 DIAGNOSIS — D122 Benign neoplasm of ascending colon: Secondary | ICD-10-CM

## 2020-09-02 DIAGNOSIS — Z992 Dependence on renal dialysis: Secondary | ICD-10-CM

## 2020-09-02 DIAGNOSIS — K625 Hemorrhage of anus and rectum: Secondary | ICD-10-CM

## 2020-09-02 DIAGNOSIS — K6381 Dieulafoy lesion of intestine: Secondary | ICD-10-CM

## 2020-09-02 HISTORY — PX: POLYPECTOMY: SHX5525

## 2020-09-02 HISTORY — PX: HEMOSTASIS CLIP PLACEMENT: SHX6857

## 2020-09-02 HISTORY — PX: HOT HEMOSTASIS: SHX5433

## 2020-09-02 HISTORY — PX: COLONOSCOPY WITH PROPOFOL: SHX5780

## 2020-09-02 LAB — TYPE AND SCREEN
ABO/RH(D): A POS
Antibody Screen: NEGATIVE
Unit division: 0

## 2020-09-02 LAB — BPAM RBC
Blood Product Expiration Date: 202204292359
ISSUE DATE / TIME: 202204191036
Unit Type and Rh: 5100

## 2020-09-02 LAB — BASIC METABOLIC PANEL
Anion gap: 12 (ref 5–15)
BUN: 73 mg/dL — ABNORMAL HIGH (ref 8–23)
CO2: 22 mmol/L (ref 22–32)
Calcium: 8.6 mg/dL — ABNORMAL LOW (ref 8.9–10.3)
Chloride: 101 mmol/L (ref 98–111)
Creatinine, Ser: 3.24 mg/dL — ABNORMAL HIGH (ref 0.61–1.24)
GFR, Estimated: 19 mL/min — ABNORMAL LOW (ref >60)
Glucose, Bld: 88 mg/dL (ref 70–99)
Potassium: 3.7 mmol/L (ref 3.5–5.1)
Sodium: 135 mmol/L (ref 135–145)

## 2020-09-02 LAB — PARATHYROID HORMONE, INTACT (NO CA): PTH: 72 pg/mL — ABNORMAL HIGH (ref 15–65)

## 2020-09-02 LAB — CBC
HCT: 26.9 % — ABNORMAL LOW (ref 39.0–52.0)
Hemoglobin: 8.5 g/dL — ABNORMAL LOW (ref 13.0–17.0)
MCH: 28.4 pg (ref 26.0–34.0)
MCHC: 31.6 g/dL (ref 30.0–36.0)
MCV: 90 fL (ref 80.0–100.0)
Platelets: 146 10*3/uL — ABNORMAL LOW (ref 150–400)
RBC: 2.99 MIL/uL — ABNORMAL LOW (ref 4.22–5.81)
RDW: 17.3 % — ABNORMAL HIGH (ref 11.5–15.5)
WBC: 3.8 10*3/uL — ABNORMAL LOW (ref 4.0–10.5)
nRBC: 0 % (ref 0.0–0.2)

## 2020-09-02 LAB — HEPATITIS C ANTIBODY: HCV Ab: NONREACTIVE

## 2020-09-02 LAB — HEPATITIS B SURFACE ANTIBODY,QUALITATIVE: Hep B S Ab: NONREACTIVE

## 2020-09-02 SURGERY — COLONOSCOPY WITH PROPOFOL
Anesthesia: General

## 2020-09-02 MED ORDER — PROPOFOL 10 MG/ML IV BOLUS
INTRAVENOUS | Status: DC | PRN
  Administered 2020-09-02: 80 mg via INTRAVENOUS
  Administered 2020-09-02: 20 mg via INTRAVENOUS

## 2020-09-02 MED ORDER — PROPOFOL 500 MG/50ML IV EMUL
INTRAVENOUS | Status: DC | PRN
  Administered 2020-09-02: 125 ug/kg/min via INTRAVENOUS

## 2020-09-02 MED ORDER — STERILE WATER FOR IRRIGATION IR SOLN
Status: DC | PRN
  Administered 2020-09-02: 100 mL

## 2020-09-02 MED ORDER — SODIUM CHLORIDE 0.9 % IV SOLN: INTRAVENOUS | Status: DC

## 2020-09-02 MED ORDER — LIDOCAINE HCL (CARDIAC) PF 100 MG/5ML IV SOSY
PREFILLED_SYRINGE | INTRAVENOUS | Status: DC | PRN
  Administered 2020-09-02: 50 mg via INTRAVENOUS

## 2020-09-02 MED ORDER — CHLORHEXIDINE GLUCONATE CLOTH 2 % EX PADS
6.0000 | MEDICATED_PAD | Freq: Every day | CUTANEOUS | Status: DC
  Administered 2020-09-04: 6 via TOPICAL

## 2020-09-02 MED ORDER — SODIUM CHLORIDE 0.9 % IV SOLN
INTRAVENOUS | Status: DC | PRN
  Administered 2020-09-02: 250 mL via INTRAVENOUS

## 2020-09-02 MED ORDER — PHENYLEPHRINE HCL (PRESSORS) 10 MG/ML IV SOLN
INTRAVENOUS | Status: DC | PRN
  Administered 2020-09-02: 80 ug via INTRAVENOUS
  Administered 2020-09-02 (×5): 120 ug via INTRAVENOUS

## 2020-09-02 MED ORDER — EPHEDRINE SULFATE 50 MG/ML IJ SOLN
INTRAMUSCULAR | Status: DC | PRN
  Administered 2020-09-02 (×3): 10 mg via INTRAVENOUS

## 2020-09-02 NOTE — Anesthesia Preprocedure Evaluation (Signed)
Anesthesia Evaluation  Patient identified by MRN, date of birth, ID band Patient awake    Reviewed: Allergy & Precautions, H&P , NPO status , Patient's Chart, lab work & pertinent test results, reviewed documented beta blocker date and time   History of Anesthesia Complications (+) PONV and history of anesthetic complications  Airway Mallampati: II  TM Distance: >3 FB Neck ROM: full    Dental no notable dental hx.    Pulmonary shortness of breath, sleep apnea , COPD, former smoker,    Pulmonary exam normal breath sounds clear to auscultation       Cardiovascular Exercise Tolerance: Good hypertension, + angina + CAD, + Past MI and +CHF  + dysrhythmias Atrial Fibrillation + Valvular Problems/Murmurs AS  Rhythm:regular Rate:Normal     Neuro/Psych Anxiety negative neurological ROS  negative psych ROS   GI/Hepatic Neg liver ROS, hiatal hernia, GERD  Medicated,  Endo/Other  Hypothyroidism   Renal/GU ARF and CRFRenal disease  negative genitourinary   Musculoskeletal   Abdominal   Peds  Hematology  (+) Blood dyscrasia, anemia ,   Anesthesia Other Findings 1. Left ventricular ejection fraction, by estimation, is 55 to 60%. The  left ventricle has normal function. The left ventricle has no regional  wall motion abnormalities. There is severe concentric left ventricular  hypertrophy. Left ventricular diastolic  parameters are consistent with Grade I diastolic dysfunction (impaired  relaxation). Elevated left atrial pressure.  2. Right ventricular systolic function is normal. The right ventricular  size is normal. There is moderately elevated pulmonary artery systolic  pressure. The estimated right ventricular systolic pressure is 57.9 mmHg.  3. Left atrial size was moderately dilated.  4. Right atrial size was mildly dilated.  5. The mitral valve is normal in structure. Mild to moderate mitral valve  regurgitation.  No evidence of mitral stenosis.  6. Tricuspid valve regurgitation is mild to moderate.  7. The aortic valve is normal in structure. There is severe calcifcation  of the aortic valve. There is severe thickening of the aortic valve.  Aortic valve regurgitation is mild. Moderate aortic valve stenosis. Aortic  valve mean gradient measures 18.0  mmHg.  8. The inferior vena cava is dilated in size with <50% respiratory  variability, suggesting right atrial pressure of 15 mmHg.   Reproductive/Obstetrics negative OB ROS                             Anesthesia Physical Anesthesia Plan  ASA: IV and emergent  Anesthesia Plan: General   Post-op Pain Management:    Induction:   PONV Risk Score and Plan: Propofol infusion  Airway Management Planned:   Additional Equipment:   Intra-op Plan:   Post-operative Plan:   Informed Consent: I have reviewed the patients History and Physical, chart, labs and discussed the procedure including the risks, benefits and alternatives for the proposed anesthesia with the patient or authorized representative who has indicated his/her understanding and acceptance.     Dental Advisory Given  Plan Discussed with: CRNA  Anesthesia Plan Comments:         Anesthesia Quick Evaluation

## 2020-09-02 NOTE — Op Note (Signed)
[facility] Patient Name: [name] Procedure Date: 09/02/2020 11:41 AM MRN: [id number] Date of Birth: 1945-07-06 Attending MD: Hildred Laser , MD CSN: [id number] Age: 75 Admit Type: Outpatient Procedure:                Colonoscopy Indications:              Rectal bleeding Providers:                Hildred Laser, MD, Lambert Mody, Aram Candela Referring MD:             Orson Eva, DO Medicines:                Propofol per Anesthesia Complications:            No immediate complications. Estimated Blood Loss:     Estimated blood loss was minimal. Procedure:                Pre-Anesthesia Assessment:                           - Prior to the procedure, a History and Physical                            was performed, and patient medications and                            allergies were reviewed. The patient's tolerance of                            previous anesthesia was also reviewed. The risks                            and benefits of the procedure and the sedation                            options and risks were discussed with the patient.                            All questions were answered, and informed consent                            was obtained. Prior Anticoagulants: The patient                            last took Plavix (clopidogrel) 5 days prior to the                            procedure. ASA Grade Assessment: IV - A patient                            with severe systemic disease that is a constant                            threat to life. After reviewing the risks and  benefits, the patient was deemed in satisfactory                            condition to undergo the procedure.                           After obtaining informed consent, the colonoscope                            was passed under direct vision. Throughout the                            procedure, the patient's blood pressure, pulse, and                             oxygen saturations were monitored continuously. The                            [id number]-[id number] ([id number]) scope was introduced through                            the anus and advanced to the the cecum, identified                            by appendiceal orifice and ileocecal valve. The                            colonoscopy was performed without difficulty. The                            patient tolerated the procedure well. The quality                            of the bowel preparation was good. The ileocecal                            valve, appendiceal orifice, and rectum were                            photographed. Scope In: 12:09:25 PM Scope Out: 12:35:24 PM Scope Withdrawal Time: 0 hours 11 minutes 48 seconds  Total Procedure Duration: 0 hours 25 minutes 59 seconds  Findings:      The perianal and digital rectal examinations were normal.      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      Coagulation for hemostasis using argon plasma was successful.      A 7 mm polyp was found in the proximal ascending colon. The polyp was       semi-pedunculated. The polyp was removed with a cold snare. Resection       and retrieval were complete. To stop active bleeding, two hemostatic       clips were successfully placed (MR conditional). There was no bleeding       at the end of the procedure.      Multiple  small and large-mouthed diverticula were found in the sigmoid       colon.      The retroflexed view of the distal rectum and anal verge was normal and       showed no anal or rectal abnormalities. 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                           - One 7 mm polyp in the proximal ascending colon,                            removed with a cold snare. Resected and retrieved.                            Clips (MR conditional) were placed.                            - Diverticulosis in the sigmoid colon. Moderate Sedation:      Per Anesthesia Care Recommendation:           - Return patient to hospital ward for ongoing care.                           - 6 small meals daily today.                           - Continue present medications.                           - Resume Plavix (clopidogrel) at prior dose in 3                            days.                           - Await pathology results.                           - Repeat colonoscopy is recommended. The                            colonoscopy date will be determined after pathology                            results from today's exam become available for                            review. Procedure Code(s):        --- Professional ---                           [phone], 49, Colonoscopy, flexible; with control of  bleeding, any method                           45385, Colonoscopy, flexible; with removal of                            tumor(s), polyp(s), or other lesion(s) by snare                            technique Diagnosis Code(s):        --- Professional ---                           K63.5, Polyp of colon                           K62.5, Hemorrhage of anus and rectum                           K57.30, Diverticulosis of large intestine without                            perforation or abscess without bleeding CPT copyright 2019 American [employer] Association. All rights reserved. The codes documented in this report are preliminary and upon coder review may  be revised to meet current compliance requirements. Hildred Laser, MD Hildred Laser, MD 09/02/2020 1:07:28 PM This report has been signed electronically. Number of Addenda: 0

## 2020-09-02 NOTE — Progress Notes (Signed)
Brief colonoscopy note  Cecal Dieulafoy lesion with active bleeding/oozing.  Ablated with APC with hemostasis. 8 mm polyp cold snared from ascending colon.  2 clips applied to polypectomy site to control bleeding. Multiple diverticula at sigmoid colon without stigmata of bleeding. Normal examination of the rectum and anal canal.

## 2020-09-02 NOTE — TOC Initial Note (Signed)
Transition of Care [facility]) - Initial/Assessment Note    Patient Details  Name: [name] MRN: [id number] Date of Birth: 13-Mar-1946  [facility] CM/SW Contact:    Ihor Gully, LCSW Phone Number: 09/02/2020, 10:36 AM  Clinical Narrative:                 Patient from home with spouse. Readmit. In need of HD outpatient. Clinicals sent to [facility]. [facility] intake contacted TOC advising that they tentatively had a chair time for patient MWF starting at 2:30-3.   Expected Discharge Plan: Home/Self Care Barriers to Discharge: Continued Medical Work up   Patient Goals and CMS Choice        Expected Discharge Plan and Services Expected Discharge Plan: Home/Self Care                                              Prior Living Arrangements/Services                       Activities of Daily Living Home Assistive Devices/Equipment: None ADL Screening (condition at time of admission) Patient's cognitive ability adequate to safely complete daily activities?: Yes Is the patient deaf or have difficulty hearing?: Yes Does the patient have difficulty seeing, even when wearing glasses/contacts?: No Does the patient have difficulty concentrating, remembering, or making decisions?: No Patient able to express need for assistance with ADLs?: Yes Does the patient have difficulty dressing or bathing?: No Independently performs ADLs?: Yes (appropriate for developmental age) Does the patient have difficulty walking or climbing stairs?: No Weakness of Legs: None Weakness of Arms/Hands: None  Permission Sought/Granted                  Emotional Assessment              Admission diagnosis:  Rectal bleeding [K62.5] Uremia [N19] Acute exacerbation of CHF (congestive heart failure) (HCC) [I50.9] Acute on chronic systolic congestive heart failure ([place]) [I50.23] Patient Active Problem List   Diagnosis Date Noted  . Rectal bleeding   .  Acute diastolic CHF (congestive heart failure) ([place]) 05/09/2020  . Acute exacerbation of Diastolic CHF (congestive heart failure) ([place]) 05/08/2020  . Symptomatic bradycardia with heart failure 05/08/2020  . COPD without exacerbation (HCC)---40 Pack Years 05/08/2020  . Acute respiratory failure with hypoxia ([place]) 05/08/2020  . Uremia in the setting of AKI on CKD V 02/07/2020  . CKD (chronic kidney disease), stage V ([place]) 02/07/2020  . Coronary artery disease involving native coronary artery of native heart without angina pectoris 12/16/2019  . 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 Gastroenteritis 12/16/2019  . History of bladder cancer 12/16/2019  . Hyperkalemia 12/16/2019  . (HFpEF) heart failure with preserved ejection fraction ([place]) 05/17/2019  . AKI (acute kidney injury) on CKD V 05/16/2019  . Proteinuria 03/28/2019  . Acute blood loss anemia 06/08/2018  . Anemia in chronic kidney disease 06/08/2018  . BPH (benign prostatic hyperplasia) 06/08/2018  . Coronary artery disease with unstable angina pectoris ([place]) 06/08/2018  . Elevated troponin I level 06/08/2018  . GIB (gastrointestinal bleeding) 06/07/2018  . Coronary artery disease involving autologous artery coronary bypass graft 06/29/2017  . 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 NSTEMI (non-ST elevated myocardial infarction) ([place]) 09/14/2016  . Chest pain 09/14/2016  . Obstructive sleep apnea 09/14/2016  .  Acute myocardial infarction of other inferior wall, initial episode of care   . Unstable angina ([place]) 01/30/2014  . OVERWEIGHT/OBESITY 02/02/2010  . HYPERLIPIDEMIA, MIXED 01/22/2009  . OTHER CHEST PAIN 01/22/2009  . TOBACCO USER 01/17/2009  . Essential hypertension 01/17/2009   PCP:  Clinic, [place]:   [id number] [place], [place] [address] [place] [address] Phone: [phone] Fax: [place], [address] [place] [address] Phone: [phone] Fax: [phone]  [name] [address] [place] [address] Phone: [phone] Fax: [phone]     Social Determinants of Health (SDOH) Interventions    Readmission Risk Interventions Readmission Risk Prevention Plan 08/09/2020  Transportation Screening Complete  Medication Review ([place]) Complete  HRI or [place] Complete  SW Recovery Care/Counseling Consult Complete  Palliative Care Screening Not [place] Not Applicable  Some recent data might be hidden

## 2020-09-02 NOTE — Progress Notes (Signed)
Nephrology Progress Note  Subjective:  He states he just got off phone with SW and they told he is set up for outpatient HD MWF at [facility].  States HD went well yesterday.  Had 1 kg UF.   Review of systems:  Has been NPO for procedure Denies n/v No blood per rectum overnight  Denies shortness of breath or chest pain  Objective Vital signs in last 24 hours: Vitals:   09/01/20 1758 09/01/20 2033 09/02/20 0320 09/02/20 0500  BP: (!) 136/54 (!) 146/52 (!) 136/53   Pulse: 61 68 72   Resp:  18 20   Temp: 97.6 F (36.4 C) 98.4 F (36.9 C) 99 F (37.2 C)   TempSrc: Oral     SpO2: 100% 98% 96%   Weight:    82.6 kg  Height:       Weight change: 0.907 kg  Intake/Output Summary (Last 24 hours) at 09/02/2020 0959 Last data filed at 09/02/2020 0500 Gross per 24 hour  Intake 676 ml  Output 2750 ml  Net -2074 ml    Assessment/Plan: 75 year old white male with COPD, CAD and advanced CKD.  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  followed closely as an outpatient by Dr. Theador Hawthorne.  Have started HD - first tx on 4/19 via AVF; per charting initially had planned for PD but he elected for initiating inpatient HD due to failure to thrive; transition to PD as outpatient if still desired  - Plan for HD on 4/21 if here (off of his new outpatient schedule).  He has a colonoscopy planned for today and no emergent need for HD - stop lasix IV every 12 hours - colonoscopy today.  After today would start lasix 80 mg daily on non-dialysis days  - Patient reports outpatient HD is set up - would await confirmation from Education officer, museum.   2. Hypertension/volume  - optimize with HD; improved  3. Anemia of CKD -is present and likely contributing to his shortness of breath.  He received a dose of Aranesp. Iron deficiency.  S/p IV iron and PRBC's.  CBC in AM. Post transfusion CBC improved  4. Metabolic  bone disease -  PTH pending  Dispo: Patient reports outpatient HD is set up - would await confirmation from Education officer, museum.    Labs: Basic Metabolic Panel: Recent Labs  Lab 08/31/20 0625 09/01/20 0517 09/01/20 1422 09/02/20 0435  NA 137 138  --  135  K 4.0 4.1  --  3.7  CL 102 103  --  101  CO2 22 24  --  22  GLUCOSE 97 103*  --  88  BUN 106* 116*  --  73*  CREATININE 4.25* 4.32*  --  3.24*  CALCIUM 9.1 8.9  --  8.6*  PHOS  --   --  4.7*  --    Liver Function Tests: Recent Labs  Lab 08/30/20 0449  AST 14*  ALT 11  ALKPHOS 75  BILITOT 1.1  PROT 7.0  ALBUMIN 3.6   No results for input(s): LIPASE, AMYLASE in the last 168 hours. No results for input(s): AMMONIA in the last 168 hours. CBC: Recent Labs  Lab 08/30/20 0449 09/01/20 0517 09/01/20 2221  WBC 5.8 4.5  --   NEUTROABS 4.4  --   --   HGB 8.4* 6.7* 8.7*  HCT 26.6* 21.6* 26.5*  MCV 90.5 90.4  --  PLT 177 170  --    Cardiac Enzymes: No results for input(s): CKTOTAL, CKMB, CKMBINDEX, TROPONINI in the last 168 hours. CBG: No results for input(s): GLUCAP in the last 168 hours.  Iron Studies:  Recent Labs    09/01/20 0517  IRON 21*  TIBC 204*  FERRITIN 416*   Studies/Results: No results found. Medications: Infusions: . sodium chloride    . sodium chloride    . sodium chloride    . ferric gluconate (FERRLECIT/NULECIT) IV Stopped (09/01/20 1530)    Scheduled Medications: . allopurinol  100 mg Oral Daily  . atorvastatin  80 mg Oral Daily  . carvedilol  3.125 mg Oral BID WC  . Chlorhexidine Gluconate Cloth  6 each Topical Q0600  . cholecalciferol  1,000 Units Oral Daily  . finasteride  5 mg Oral Daily  . folic acid  1 mg Oral Daily  . furosemide  40 mg Intravenous Q12H  . hydrALAZINE  100 mg Oral TID  . hydrocortisone  25 mg Rectal BID  . isosorbide mononitrate  120 mg Oral Daily  . levothyroxine  50 mcg Oral QAC breakfast  . magnesium oxide  400 mg Oral Daily  . omega-3 acid ethyl esters   2,000 mg Oral BID  . pantoprazole  40 mg Oral Daily  . sodium chloride flush  3 mL Intravenous Q12H  . tamsulosin  0.4 mg Oral Daily    have reviewed scheduled and prn medications.  Physical Exam:  General adult male in bed in no acute distress HEENT normocephalic atraumatic extraocular movements intact sclera anicteric Neck supple trachea midline Lungs clear to auscultation bilaterally normal work of breathing at rest on room air  Heart S1S2 no rubs or gallops appreciated Abdomen soft nontender nondistended Extremities trace edema  Psych normal mood and affect Neuro - alert and oriented x 3 provides hx and follows commands Access: right UE AVF in place with bruit and thrill   Claudia Desanctis, MD 09/02/2020,9:59 AM

## 2020-09-02 NOTE — Anesthesia Postprocedure Evaluation (Signed)
Anesthesia Post Note  Patient: [name]  Procedure(s) Performed: COLONOSCOPY WITH PROPOFOL (N/A ) POLYPECTOMY HOT HEMOSTASIS (ARGON PLASMA COAGULATION/BICAP) HEMOSTASIS CLIP PLACEMENT  Patient location during evaluation: Phase II Anesthesia Type: General Level of consciousness: awake Pain management: pain level controlled Vital Signs Assessment: post-procedure vital signs reviewed and stable Respiratory status: spontaneous breathing and respiratory function stable Cardiovascular status: blood pressure returned to baseline and stable Postop Assessment: no headache and no apparent nausea or vomiting Anesthetic complications: no Comments: Late entry   No complications documented.   Last Vitals:  Vitals:   09/02/20 1300 09/02/20 1345  BP: (!) 146/51 134/65  Pulse: 83 65  Resp: 15 15  Temp:  36.4 C  SpO2: 94% 99%    Last Pain:  Vitals:   09/02/20 1245  TempSrc:   PainSc: [place]

## 2020-09-02 NOTE — Progress Notes (Signed)
Subjective:  Patient has no complaints.  He denies chest pain or shortness of breath.  He says rectal bleeding started 5 days ago.  He would pass fresh blood with his bowel movements.  He has been prone to constipation but not bad.  He did not experience abdominal or anorectal pain.  Last Plavix dose was 5 days ago.  He is hungry. He says his last colonoscopy was at [facility] and or not [place] possibly in 2017.  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  Current Medications:  Current Facility-Administered Medications:  .  [MAR Hold] 0.9 %  sodium chloride infusion, 250 mL, Intravenous, PRN, Barton Dubois, MD .  Doug Sou Hold] 0.9 %  sodium chloride infusion, 100 mL, Intravenous, PRN, Corliss Parish, MD .  Doug Sou Hold] 0.9 %  sodium chloride infusion, 100 mL, Intravenous, PRN, Corliss Parish, MD .  Doug Sou Hold] 0.9 %  sodium chloride infusion, , Intravenous, PRN, Tat, David, MD, Last Rate: 10 mL/hr at 09/02/20 1007, 250 mL at 09/02/20 1007 .  [MAR Hold] acetaminophen (TYLENOL) tablet 650 mg, 650 mg, Oral, Q4H PRN, Barton Dubois, MD .  Doug Sou Hold] albuterol (PROVENTIL) (2.5 MG/3ML) 0.083% nebulizer solution 2.5 mg, 2.5 mg, Nebulization, Q2H PRN, Zierle-Ghosh, Asia B, DO .  [MAR Hold] allopurinol (ZYLOPRIM) tablet 100 mg, 100 mg, Oral, Daily, Barton Dubois, MD, 100 mg at 09/01/20 0829 .  [MAR Hold] alteplase (CATHFLO ACTIVASE) injection 2 mg, 2 mg, Intracatheter, Once PRN, Corliss Parish, MD .  Doug Sou Hold] atorvastatin (LIPITOR) tablet 80 mg, 80 mg, Oral, Daily, Barton Dubois, MD, 80 mg at 09/01/20 5329 .  [MAR Hold] carvedilol (COREG) tablet 3.125 mg, 3.125 mg, Oral, BID WC, Barton Dubois, MD, 3.125 mg at 09/01/20 1803 .  [MAR Hold] Chlorhexidine Gluconate Cloth 2 % PADS 6 each, 6 each, Topical, Q0600, Corliss Parish, MD, 6 each at 09/02/20 0534 .  [MAR Hold] Chlorhexidine Gluconate Cloth 2 % PADS 6 each, 6 each, Topical, Q0600, Claudia Desanctis, MD .  Doug Sou Hold] cholecalciferol (VITAMIN D3) tablet 1,000 Units, 1,000 Units, Oral, Daily, Barton Dubois, MD, 1,000 Units at 09/01/20 0830 .  [MAR Hold] ferric gluconate (NULECIT) 125 mg in sodium chloride 0.9 % 100 mL IVPB, 125 mg, Intravenous, Daily, Corliss Parish, MD, Last Rate: 110 mL/hr at 09/02/20 1009, 125 mg at 09/02/20 1009 .  [MAR Hold] finasteride (PROSCAR) tablet 5 mg, 5 mg, Oral, Daily, Barton Dubois, MD, 5 mg at 09/01/20 [phone] .  [MAR Hold] folic acid (FOLVITE) tablet 1 mg, 1 mg, Oral, Daily, Barton Dubois, MD, 1 mg at 09/01/20 0829 .  [MAR Hold] heparin injection 1,000 Units, 1,000 Units, Dialysis, PRN, Corliss Parish, MD .  Doug Sou Hold] hydrALAZINE (APRESOLINE) tablet 100 mg, 100 mg, Oral, TID, Barton Dubois, MD, 100 mg at 09/01/20 2130 .  [MAR Hold] hydrocortisone (ANUSOL-HC) suppository 25 mg, 25 mg, Rectal, BID, Annitta Needs, NP, 25 mg at 09/01/20 0830 .  [MAR Hold] isosorbide mononitrate (IMDUR) 24 hr tablet 120 mg, 120 mg, Oral, Daily, Barton Dubois, MD, 120 mg at 09/01/20 0829 .  [MAR Hold] levothyroxine (SYNTHROID) tablet 50 mcg, 50 mcg, Oral, QAC breakfast, Barton Dubois, MD, 50 mcg at 09/02/20 0532 .  [MAR Hold] lidocaine (PF) (XYLOCAINE) 1 % injection 5 mL, 5 mL, Intradermal, PRN, Corliss Parish, MD .  Doug Sou Hold] lidocaine-prilocaine (EMLA) cream 1 application, 1 application, Topical, PRN, Corliss Parish, MD .  Doug Sou Hold] magnesium oxide (MAG-OX) tablet 400 mg, 400 mg,  Oral, Daily, Barton Dubois, MD, 400 mg at 09/01/20 0827 .  [MAR Hold] omega-3 acid ethyl esters (LOVAZA) capsule 2,000 mg, 2,000 mg, Oral, BID, Barton Dubois, MD, 2,000 mg at 09/01/20 2130 .  [MAR Hold] ondansetron (ZOFRAN) injection 4 mg, 4 mg, Intravenous, Q6H PRN, Barton Dubois, MD .  Doug Sou Hold] pantoprazole (PROTONIX) EC tablet 40 mg, 40 mg, Oral, Daily, Barton Dubois, MD, 40 mg at 09/01/20 5374 .  [MAR Hold] pentafluoroprop-tetrafluoroeth (GEBAUERS) aerosol 1  application, 1 application, Topical, PRN, Corliss Parish, MD .  Doug Sou Hold] sodium chloride flush (NS) 0.9 % injection 3 mL, 3 mL, Intravenous, Q12H, Barton Dubois, MD, 3 mL at 09/01/20 2129 .  [MAR Hold] sodium chloride flush (NS) 0.9 % injection 3 mL, 3 mL, Intravenous, PRN, Barton Dubois, MD .  Doug Sou Hold] tamsulosin [facility]) capsule 0.4 mg, 0.4 mg, Oral, Daily, Barton Dubois, MD, 0.4 mg at 09/01/20 8270   Objective: Blood pressure (!) 148/52, pulse 76, temperature 98.4 F (36.9 C), temperature source Oral, resp. rate 18, height 5' 9"  (1.753 m), weight 82.6 kg, SpO2 97 %. Patient is alert and in no acute distress. Conjunctiva is pink. Sclera is nonicteric Oropharyngeal mucosa is normal. No neck masses or thyromegaly noted. 2 midsternal scars. Cardiac exam with regular rhythm normal S1 and S2.  He has grade 2/6 systolic ejection murmur best heard at aortic area. Lungs are clear to auscultation. Abdomen is symmetrical.  He has bilateral inguinal herniorrhaphy scars.  Abdomen is soft and nontender with organomegaly or masses. He has trace edema around both ankles.  Labs/studies Results:  CBC Latest Ref Rng & Units 09/01/2020 09/01/2020 08/30/2020  WBC 4.0 - 10.5 K/uL - 4.5 5.8  Hemoglobin 13.0 - 17.0 g/dL 8.7(L) 6.7(LL) 8.4(L)  Hematocrit 39.0 - 52.0 % 26.5(L) 21.6(L) 26.6(L)  Platelets 150 - 400 K/uL - 170 177    CMP Latest Ref Rng & Units 09/02/2020 09/01/2020 08/31/2020  Glucose 70 - 99 mg/dL 88 103(H) 97  BUN 8 - 23 mg/dL 73(H) 116(H) 106(H)  Creatinine 0.61 - 1.24 mg/dL 3.24(H) 4.32(H) 4.25(H)  Sodium 135 - 145 mmol/L 135 138 137  Potassium 3.5 - 5.1 mmol/L 3.7 4.1 4.0  Chloride 98 - 111 mmol/L 101 103 102  CO2 22 - 32 mmol/L 22 24 22   Calcium 8.9 - 10.3 mg/dL 8.6(L) 8.9 9.1  Total Protein 6.5 - 8.1 g/dL - - -  Total Bilirubin 0.3 - 1.2 mg/dL - - -  Alkaline Phos 38 - 126 U/L - - -  AST 15 - 41 U/L - - -  ALT 0 - 44 U/L - - -    Hepatic Function Latest Ref Rng & Units  08/30/2020 08/11/2020 08/08/2020  Total Protein 6.5 - 8.1 g/dL 7.0 - 6.4(L)  Albumin 3.5 - 5.0 g/dL 3.6 3.0(L) 3.2(L)  AST 15 - 41 U/L 14(L) - 21  ALT 0 - 44 U/L 11 - 17  Alk Phosphatase 38 - 126 U/L 75 - 75  Total Bilirubin 0.3 - 1.2 mg/dL 1.1 - 0.7  Bilirubin, Direct 0.0 - 0.3 mg/dL - - -     Assessment:  #1.  Rectal bleeding.  He has received 1 unit of PRBCs.  It remains to be seen if he has vascular abnormality or it is just hemorrhoidal bleeding in the setting of antiplatelet therapy.  #2.  Acute on chronic anemia secondary to rectal bleeding.  He received 1 unit of PRBCs yesterday.  Posttransfusion hemoglobin has increased by 2 g and now  it is 8.7 g.  #3.  Acute on chronic renal failure.  Patient was dialyzed yesterday.  #4.  Coronary artery disease.   Plan:  Proceed with diagnostic colonoscopy under monitored anesthesia care. Patient is agreeable.

## 2020-09-02 NOTE — Transfer of Care (Signed)
Immediate Anesthesia Transfer of Care Note  Patient: [name]  Procedure(s) Performed: COLONOSCOPY WITH PROPOFOL (N/A ) POLYPECTOMY HOT HEMOSTASIS (ARGON PLASMA COAGULATION/BICAP) HEMOSTASIS CLIP PLACEMENT  Patient Location: PACU  Anesthesia Type:General  Level of Consciousness: awake, alert  and oriented  Airway & Oxygen Therapy: Patient Spontanous Breathing and Patient connected to nasal cannula oxygen  Post-op Assessment: Report given to RN and Post -op Vital signs reviewed and stable  Post vital signs: Reviewed and stable  Last Vitals:  Vitals Value Taken Time  BP 150/65 09/02/20 1244  Temp    Pulse 85 09/02/20 1244  Resp 20 09/02/20 1244  SpO2 98 % 09/02/20 1244  Vitals shown include unvalidated device data.  Last Pain:  Vitals:   09/02/20 1207  TempSrc:   PainSc: 0-No pain         Complications: No complications documented.

## 2020-09-02 NOTE — Plan of Care (Signed)
  Problem: Education: Goal: Knowledge of General Education information will improve Description Including pain rating scale, medication(s)/side effects and non-pharmacologic comfort measures Outcome: Progressing   Problem: Activity: Goal: Risk for activity intolerance will decrease Outcome: Progressing   Problem: Safety: Goal: Ability to remain free from injury will improve Outcome: Progressing   

## 2020-09-02 NOTE — Progress Notes (Signed)
Alert and oriented x 4. Denies pain or discomfort. Took all of the bowel prep and was clear by arounf 2200. Declined to use CPAP saying it was not comfortable like his home one and he had to use bathroom a lot during the night. Independent with adls in room. NPO after midnight for colonoscopy. Continue plan of care.

## 2020-09-02 NOTE — Progress Notes (Signed)
PROGRESS NOTE  [name] EXH:[id number] DOB: Sep 18, 1945 DOA: 08/30/2020 PCP: [facility]  Brief History:  75 y.o.malewith medical history significant ofcoronary artery disease (status post CABG), history of COPD (not chronically on oxygen), hypertension, chronic diastolic heart failure BPH and chronic kidney disease stage V;who presented to the hospital secondary to shortness of breath. Patient reports that for the last 4 days or so he has been experiencing increased shortness of breath, dyspnea on exertion and orthopnea. Patient expressed being compliant with his medications and low-sodium diet. On presentation to ED, hisoxygen saturation was below 90% on room air; stabilizing the mid 90s on 2 L supplementation. Patient reported newly seen intermittent episodes of BRBPR and some blood when he wipes himself.  Patient denies chest pain, nausea, vomiting, dysuria, hematuria, melena, hematemesis, abdominal pain, sick contacts or any other complaints.   ED Course:Elevated BNP, worsening renal function (with increased creatinine from baseline) appreciated and chest x-ray demonstrating vascular congestion and CHF pattern. IV Lasix x1 was given in the ED and [facility] has been contacted to place patient in the hospital for further evaluation and management of acute on chronic diastolic heart failure with concern for cardiorenal syndrome.  Assessment/Plan: acute respiratory failure with hypoxia  -Now weaned off oxygen -Decreasing urine output with worsening renal function and elevation on BUN. -initially on IV diuresis--last dose 4/19 -now stable on RA  Acute on chronic diastolic CHF -38/10/17 Echo--EF 55-60%, G1DD, PASP 47.7; mod AS -initially started on IV lasix with clinical improvement but worsen renal function -now on HD due to ESRD -lasix 80 po daily on non-HD days  acute on chronic renal failure--CKD 5>>>ESRD -prior baseline 3.6-3.9 -appreciate  nephrology -patient in agreement with HD initiation; first hemodialysis treatment will be provided 4/19 recommendations;  -TOC working with outpatient placement>>MWF -planning HD again 09/03/20  Hematochezia/lower GI bleed -started 08/28/20 -GI consulted -09/02/20 colonoscopy--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 -Hemoglobin nadir  6.3 -1 unit PRBCs with dialysis to be given 4/19 -GI service consulted and planning colonoscopy evaluation. -Clear liquid diet has been ordered in anticipation for bowel prep by GI. -IV iron and Epogen as per nephrology -essential hypertension -Stable -Continue coreg, hydralazine, imdur  BPH -No complaints of urinary retention -Continue Proscar and Flomax.  coronary artery disease -Remains chest pain-free -Continue beta-blocker, statins, Imdur, hydralazine and as needed nitroglycerin.  gastroesophageal disease -Continue PPI  COPD without exacerbation -Continue as needed bronchodilators -no wheezing; stable on RA  obstructive sleep apnea -Continue nightly CPAP.  hypothyroidism -Continue Synthroid.    Status is: Inpatient  Remains inpatient appropriate because:IV treatments appropriate due to intensity of illness or inability to take PO   Dispo: The patient is from: Home              Anticipated d/c is to: Home              Patient currently is not medically stable to d/c.   Difficult to place patient No        Family Communication:  no Family at bedside  Consultants:  GI, renal  Code Status:  FULL   DVT Prophylaxis:  SCDs   Procedures: As Listed in Progress Note Above  Antibiotics: None     Subjective: Patient denies fevers, chills, headache, chest pain, dyspnea, nausea, vomiting, diarrhea, abdominal pain, dysuria, hematuria,  hematochezia, and  melena.   Objective: Vitals:   09/02/20 1110 09/02/20 1245 09/02/20 1300 09/02/20 1345  BP: (!) 148/52 (!) 154/61 (!) 146/51 134/65  Pulse:  85 83 65  Resp:  20 15 15   Temp:  (!) 97.5 F (36.4 C)  97.6 F (36.4 C)  TempSrc:      SpO2:  98% 94% 99%  Weight:      Height:        Intake/Output Summary (Last 24 hours) at 09/02/2020 1709 Last data filed at 09/02/2020 1415 Gross per 24 hour  Intake 590 ml  Output 1300 ml  Net -710 ml   Weight change: 0.907 kg Exam:   General:  Pt is alert, follows commands appropriately, not in acute distress  HEENT: No icterus, No thrush, No neck mass, [place]/AT  Cardiovascular: RRR, S1/S2, no rubs, no gallops  Respiratory: fine bibasilar rales, no wheezing, no crackles, no rhonchi  Abdomen: Soft/+BS, non tender, non distended, no guarding  Extremities: trace LE edema, No lymphangitis, No petechiae, No rashes, no synovitis   Data Reviewed: I have personally reviewed following labs and imaging studies Basic Metabolic Panel: Recent Labs  Lab 08/30/20 0449 08/30/20 0803 08/31/20 0625 09/01/20 0517 09/01/20 1422 09/02/20 0435  NA 138  --  137 138  --  135  K 4.1  --  4.0 4.1  --  3.7  CL 104  --  102 103  --  101  CO2 21*  --  22 24  --  22  GLUCOSE 106*  --  97 103*  --  88  BUN 106*  --  106* 116*  --  73*  CREATININE 4.29*  --  4.25* 4.32*  --  3.24*  CALCIUM 9.2  --  9.1 8.9  --  8.6*  MG  --  2.0  --   --   --   --   PHOS  --   --   --   --  4.7*  --    Liver Function Tests: Recent Labs  Lab 08/30/20 0449  AST 14*  ALT 11  ALKPHOS 75  BILITOT 1.1  PROT 7.0  ALBUMIN 3.6   No results for input(s): LIPASE, AMYLASE in the last 168 hours. No results for input(s): AMMONIA in the last 168 hours. Coagulation Profile: Recent Labs  Lab 08/30/20 0449  INR 1.2   CBC: Recent Labs  Lab 08/30/20 0449 09/01/20 0517 09/01/20 2221 09/02/20 1339  WBC 5.8 4.5  --  3.8*  NEUTROABS 4.4  --   --   --   HGB 8.4* 6.7* 8.7* 8.5*   HCT 26.6* 21.6* 26.5* 26.9*  MCV 90.5 90.4  --  90.0  PLT 177 170  --  146*   Cardiac Enzymes: No results for input(s): CKTOTAL, CKMB, CKMBINDEX, TROPONINI in the last 168 hours. BNP: Invalid input(s): POCBNP CBG: No results for input(s): GLUCAP in the last 168 hours. HbA1C: No results for input(s): HGBA1C in the last 72 hours. Urine analysis:    Component Value Date/Time   COLORURINE YELLOW 02/09/2020 0958   APPEARANCEUR CLEAR 02/09/2020 0958   LABSPEC 1.012 02/09/2020 0958   PHURINE 5.0 02/09/2020 0958   GLUCOSEU NEGATIVE 02/09/2020 0958   HGBUR NEGATIVE 02/09/2020 0958   BILIRUBINUR NEGATIVE 02/09/2020 0958   KETONESUR NEGATIVE 02/09/2020 0958   PROTEINUR 100 (A) 02/09/2020 0958   NITRITE NEGATIVE 02/09/2020 0958   LEUKOCYTESUR NEGATIVE 02/09/2020 0958   Sepsis Labs: @LABRCNTIP (procalcitonin:4,lacticidven:4) ) Recent Results (from the  past 240 hour(s))  Resp Panel by RT-PCR (Flu A&B, Covid) Nasopharyngeal Swab     Status: None   Collection Time: 08/30/20  6:55 AM   Specimen: Nasopharyngeal Swab; Nasopharyngeal(NP) swabs in vial transport medium  Result Value Ref Range Status   SARS Coronavirus 2 by RT PCR NEGATIVE NEGATIVE Final    Comment: (NOTE) SARS-CoV-2 target nucleic acids are NOT DETECTED.  The SARS-CoV-2 RNA is generally detectable in upper respiratory specimens during the acute phase of infection. The lowest concentration of SARS-CoV-2 viral copies this assay can detect is 138 copies/mL. A negative result does not preclude SARS-Cov-2 infection and should not be used as the sole basis for treatment or other patient management decisions. A negative result may occur with  improper specimen collection/handling, submission of specimen other than nasopharyngeal swab, presence of viral mutation(s) within the areas targeted by this assay, and inadequate number of viral copies(<138 copies/mL). A negative result must be combined with clinical observations, patient  history, and epidemiological information. The expected result is Negative.  Fact Sheet for Patients:  EntrepreneurPulse.com.au  Fact Sheet for Healthcare Providers:  IncredibleEmployment.be  This test is no t yet approved or cleared by the [place] FDA and  has been authorized for detection and/or diagnosis of SARS-CoV-2 by FDA under an Emergency Use Authorization (EUA). This EUA will remain  in effect (meaning this test can be used) for the duration of the COVID-19 declaration under Section 564(b)(1) of the Act, 21 U.S.C.section 360bbb-3(b)(1), unless the authorization is terminated  or revoked sooner.       Influenza A by PCR NEGATIVE NEGATIVE Final   Influenza B by PCR NEGATIVE NEGATIVE Final    Comment: (NOTE) The Xpert Xpress SARS-CoV-2/FLU/RSV plus assay is intended as an aid in the diagnosis of influenza from Nasopharyngeal swab specimens and should not be used as a sole basis for treatment. Nasal washings and aspirates are unacceptable for Xpert Xpress SARS-CoV-2/FLU/RSV testing.  Fact Sheet for Patients: EntrepreneurPulse.com.au  Fact Sheet for Healthcare Providers: IncredibleEmployment.be  This test is not yet approved or cleared by the [place] FDA and has been authorized for detection and/or diagnosis of SARS-CoV-2 by FDA under an Emergency Use Authorization (EUA). This EUA will remain in effect (meaning this test can be used) for the duration of the COVID-19 declaration under Section 564(b)(1) of the Act, 21 U.S.C. section 360bbb-3(b)(1), unless the authorization is terminated or revoked.  Performed at [facility], [address] [place], [place] [address]      Scheduled Meds: . allopurinol  100 mg Oral Daily  . atorvastatin  80 mg Oral Daily  . carvedilol  3.125 mg Oral BID WC  . Chlorhexidine Gluconate Cloth  6 each Topical Q0600  . Chlorhexidine Gluconate Cloth  6 each  Topical Q0600  . cholecalciferol  1,000 Units Oral Daily  . finasteride  5 mg Oral Daily  . folic acid  1 mg Oral Daily  . hydrALAZINE  100 mg Oral TID  . hydrocortisone  25 mg Rectal BID  . isosorbide mononitrate  120 mg Oral Daily  . levothyroxine  50 mcg Oral QAC breakfast  . magnesium oxide  400 mg Oral Daily  . omega-3 acid ethyl esters  2,000 mg Oral BID  . pantoprazole  40 mg Oral Daily  . sodium chloride flush  3 mL Intravenous Q12H  . tamsulosin  0.4 mg Oral Daily   Continuous Infusions: . sodium chloride    . sodium chloride    .  sodium chloride    . sodium chloride 250 mL (09/02/20 1007)  . ferric gluconate (FERRLECIT/NULECIT) IV 125 mg (09/02/20 1009)    Procedures/Studies: DG Chest 2 View  Result Date: 08/10/2020 CLINICAL DATA:  Shortness of breath. EXAM: CHEST - 2 VIEW COMPARISON:  08/08/2020. FINDINGS: Trachea is midline. Heart is mildly enlarged. Thoracic aorta is calcified. Mild interstitial prominence and indistinctness appear similar. Small bilateral pleural effusions, new or increased. IMPRESSION: 1. Congestive heart failure. 2.  Aortic atherosclerosis (ICD10-I70.0). Electronically Signed   By: Lorin Picket M.D.   On: 08/10/2020 08:50   DG Chest Portable 1 View  Result Date: 08/30/2020 CLINICAL DATA:  Shortness of breath since Friday EXAM: PORTABLE CHEST 1 VIEW COMPARISON:  08/10/2020 FINDINGS: Interstitial opacity with Awanda Mink lines and small right pleural effusion. Cardiomegaly. Prior CABG. IMPRESSION: CHF pattern. Electronically Signed   By: Monte Fantasia M.D.   On: 08/30/2020 04:41   DG Chest Port 1 View  Result Date: 08/08/2020 CLINICAL DATA:  Shortness of breath that started last night EXAM: PORTABLE CHEST 1 VIEW COMPARISON:  05/08/2020 FINDINGS: Normal heart size and mediastinal contours. Diffuse interstitial prominence similar to prior. Kerley lines are noted. No visible effusion or air leak. Artifact from EKG leads. IMPRESSION: Interstitial opacity  favoring edema. Electronically Signed   By: Monte Fantasia M.D.   On: 08/08/2020 08:42    Orson Eva, DO  [facility]  If 7PM-7AM, please contact night-coverage [email or web address] Password [email or web address] 09/02/2020, 5:09 PM   LOS: 3 days

## 2020-09-03 DIAGNOSIS — K625 Hemorrhage of anus and rectum: Secondary | ICD-10-CM

## 2020-09-03 LAB — CBC
HCT: 24.2 % — ABNORMAL LOW (ref 39.0–52.0)
Hemoglobin: 7.6 g/dL — ABNORMAL LOW (ref 13.0–17.0)
MCH: 28.5 pg (ref 26.0–34.0)
MCHC: 31.4 g/dL (ref 30.0–36.0)
MCV: 90.6 fL (ref 80.0–100.0)
Platelets: 142 10*3/uL — ABNORMAL LOW (ref 150–400)
RBC: 2.67 MIL/uL — ABNORMAL LOW (ref 4.22–5.81)
RDW: 17.1 % — ABNORMAL HIGH (ref 11.5–15.5)
WBC: 4 10*3/uL (ref 4.0–10.5)
nRBC: 0 % (ref 0.0–0.2)

## 2020-09-03 LAB — BASIC METABOLIC PANEL
Anion gap: 10 (ref 5–15)
BUN: 73 mg/dL — ABNORMAL HIGH (ref 8–23)
CO2: 25 mmol/L (ref 22–32)
Calcium: 8.5 mg/dL — ABNORMAL LOW (ref 8.9–10.3)
Chloride: 100 mmol/L (ref 98–111)
Creatinine, Ser: 3.42 mg/dL — ABNORMAL HIGH (ref 0.61–1.24)
GFR, Estimated: 18 mL/min — ABNORMAL LOW (ref >60)
Glucose, Bld: 89 mg/dL (ref 70–99)
Potassium: 3.7 mmol/L (ref 3.5–5.1)
Sodium: 135 mmol/L (ref 135–145)

## 2020-09-03 LAB — SURGICAL PATHOLOGY

## 2020-09-03 MED ORDER — FUROSEMIDE 80 MG PO TABS: 80.0000 mg | ORAL_TABLET | Freq: Two times a day (BID) | ORAL | 1 refills | Status: DC

## 2020-09-03 NOTE — Progress Notes (Signed)
Patient has leaking IV.  Went to restart IV, patient refused at this time.  Patient stated he hopes to be discharged soon and would wait for IV access at this time.

## 2020-09-03 NOTE — Discharge Summary (Signed)
Physician Discharge Summary  [name] BDZ:[id number] DOB: 04-12-1946 DOA: 08/30/2020  PCP: [facility]  Admit date: 08/30/2020 Discharge date: 09/03/2020  Admitted From: Home Disposition:  Home   Recommendations for Outpatient Follow-up:  1. Follow up with PCP in 1-2 weeks 2. Please obtain BMP/CBC in one week     Discharge Condition: Stable CODE STATUS: FULL Diet recommendation: Heart Healthy    Brief/Interim Summary: 75 y.o.malewith medical history significant ofcoronary artery disease (status post CABG), history of COPD (not chronically on oxygen), hypertension, chronic diastolic heart failure BPH and chronic kidney disease stage V;who presented to the hospital secondary to shortness of breath. Patient reports that for the last 4 days or so he has been experiencing increased shortness of breath, dyspnea on exertion and orthopnea. Patient expressed being compliant with his medications and low-sodium diet. On presentation to ED, hisoxygen saturation was below 90% on room air; stabilizing the mid 90s on 2 L supplementation. Patient reported newly seen intermittent episodes of BRBPR and some blood when he wipes himself.  Patient denies chest pain, nausea, vomiting, dysuria, hematuria, melena, hematemesis, abdominal pain, sick contacts or any other complaints.   ED Course:Elevated BNP, worsening renal function (with increased creatinine from baseline) appreciated and chest x-ray demonstrating vascular congestion and CHF pattern. IV Lasix x1 was given in the ED and [facility] has been contacted to place patient in the hospital for further evaluation and management of acute on chronic diastolic heart failure with concern for cardiorenal syndrome.  Discharge Diagnoses:  acute respiratory failure with hypoxia  -Now weaned off oxygen -Decreasing urine output with worsening renal functionand elevation on BUN. 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 -Hemoglobin nadir  6.3 -1 unit PRBCs with dialysis given 4/19 -GI service consulted and planning colonoscopy evaluation. -Clear liquid diet has been ordered in anticipation for bowel prep by GI. -IV iron and Epogen as per nephrology  essential hypertension -Stable -Continue coreg, hydralazine, imdur -d/c amlodipine  BPH -No complaints of urinary retention -Continue Proscar and Flomax.  coronary artery disease -Remains chest pain-free -Continue beta-blocker, statins, Imdur, hydralazine and as needed nitroglycerin.  gastroesophageal disease -Continue PPI  COPD without exacerbation -Continue as needed bronchodilators -no wheezing; stable on RA  obstructive sleep apnea -Continue nightly CPAP.  hypothyroidism -Continue Synthroid.   Discharge Instructions   Allergies as of 09/03/2020      Reactions   Cardizem Cd [diltiazem Hcl Er Beads] Palpitations   "Makes heart skip"      Medication List    STOP taking these medications   amLODipine 10 MG tablet Commonly known as: NORVASC   Klor-Con M20 20 MEQ tablet Generic drug: potassium  chloride SA     TAKE these medications   acetaminophen 325 MG tablet Commonly known  as: TYLENOL Take 2 tablets (650 mg total) by mouth every 6 (six) hours as needed for mild pain.   allopurinol 100 MG tablet Commonly known as: ZYLOPRIM Take 100 mg by mouth daily.   atorvastatin 80 MG tablet Commonly known as: LIPITOR Take 80 mg by mouth daily.   carvedilol 3.125 MG tablet Commonly known as: Coreg Take 1 tablet (3.125 mg total) by mouth 2 (two) times daily.   clopidogrel 75 MG tablet Commonly known as: PLAVIX Take 75 mg by mouth daily.   epoetin alfa 20000 UNIT/ML injection Commonly known as: EPOGEN Inject 20,000 Units into the skin every Monday.   finasteride 5 MG tablet Commonly known as: PROSCAR Take 5 mg by mouth daily.   folic acid 1 MG tablet Commonly known as: FOLVITE Take 1 mg by mouth daily.   furosemide 80 MG tablet Commonly known as: LASIX Take 1 tablet (80 mg total) by mouth 2 (two) times daily. What changed:   medication strength  how much to take   hydrALAZINE 100 MG tablet Commonly known as: APRESOLINE Take 1 tablet (100 mg total) by mouth 3 (three) times daily.   isosorbide mononitrate 120 MG 24 hr tablet Commonly known as: IMDUR Take 1 tablet (120 mg total) by mouth daily. What changed: how much to take   levothyroxine 50 MCG tablet Commonly known as: SYNTHROID Take 50 mcg by mouth daily before breakfast.   magnesium oxide 400 MG tablet Commonly known as: MAG-OX Take 1 tablet (400 mg total) by mouth daily.   nitroGLYCERIN 0.4 MG SL tablet Commonly known as: NITROSTAT Place 0.4 mg under the tongue every 5 (five) minutes as needed for chest pain (max 3 doses).   omeprazole 20 MG capsule Commonly known as: PRILOSEC Take 20 mg by mouth 2 (two) times daily.   RA Fish Oil 1000 MG Caps Take 2,000 mg by mouth 2 (two) times daily.   tamsulosin 0.4 MG Caps capsule Commonly known as: FLOMAX Take 0.4 mg by mouth daily.   Vitamin D  (Cholecalciferol) 25 MCG (1000 UT) Caps Take 1,000 Units by mouth daily.       Allergies  Allergen Reactions  . Cardizem Cd [Diltiazem Hcl Er Beads] Palpitations    "Makes heart skip"    Consultations:  GI  renal   Procedures/Studies: DG Chest 2 View  Result Date: 08/10/2020 CLINICAL DATA:  Shortness of breath. EXAM: CHEST - 2 VIEW COMPARISON:  08/08/2020. FINDINGS: Trachea is midline. Heart is mildly enlarged. Thoracic aorta is calcified. Mild interstitial prominence and indistinctness appear similar. Small bilateral pleural effusions, new or increased. IMPRESSION: 1. Congestive heart failure. 2.  Aortic atherosclerosis (ICD10-I70.0). Electronically Signed   By: Lorin Picket M.D.   On: 08/10/2020 08:50   DG Chest Portable 1 View  Result Date: 08/30/2020 CLINICAL DATA:  Shortness of breath since Friday EXAM: PORTABLE CHEST 1 VIEW COMPARISON:  08/10/2020 FINDINGS: Interstitial opacity with Awanda Mink lines and small right pleural effusion. Cardiomegaly. Prior CABG. IMPRESSION: CHF pattern. Electronically Signed   By: Monte Fantasia M.D.   On: 08/30/2020 04:41   DG Chest Port 1 View  Result Date: 08/08/2020 CLINICAL DATA:  Shortness of breath that started last night EXAM: PORTABLE CHEST 1 VIEW COMPARISON:  05/08/2020 FINDINGS: Normal heart size and mediastinal contours. Diffuse interstitial prominence similar to prior. Kerley lines are noted. No visible effusion or air leak. Artifact from EKG leads. IMPRESSION: Interstitial opacity favoring edema. Electronically Signed   By: Monte Fantasia M.D.   On:  08/08/2020 08:42        Discharge Exam: Vitals:   09/03/20 1015 09/03/20 1030  BP: (!) 135/56 (!) 127/56  Pulse: 66 65  Resp: 20 18  Temp:    SpO2:     Vitals:   09/03/20 0830 09/03/20 1000 09/03/20 1015 09/03/20 1030  BP: (!) 127/48 (!) 125/56 (!) 135/56 (!) 127/56  Pulse: 70 66 66 65  Resp: 16 18 20 18   Temp: 97.6 F (36.4 C) 97.6 F (36.4 C)    TempSrc: Oral Oral     SpO2: 100%     Weight:  84 kg    Height:        General: Pt is alert, awake, not in acute distress Cardiovascular: RRR, S1/S2 +, no rubs, no gallops Respiratory: CTA bilaterally, no wheezing, no rhonchi Abdominal: Soft, NT, ND, bowel sounds + Extremities: no edema, no cyanosis   The results of significant diagnostics from this hospitalization (including imaging, microbiology, ancillary and laboratory) are listed below for reference.    Significant Diagnostic Studies: DG Chest 2 View  Result Date: 08/10/2020 CLINICAL DATA:  Shortness of breath. EXAM: CHEST - 2 VIEW COMPARISON:  08/08/2020. FINDINGS: Trachea is midline. Heart is mildly enlarged. Thoracic aorta is calcified. Mild interstitial prominence and indistinctness appear similar. Small bilateral pleural effusions, new or increased. IMPRESSION: 1. Congestive heart failure. 2.  Aortic atherosclerosis (ICD10-I70.0). Electronically Signed   By: Lorin Picket M.D.   On: 08/10/2020 08:50   DG Chest Portable 1 View  Result Date: 08/30/2020 CLINICAL DATA:  Shortness of breath since Friday EXAM: PORTABLE CHEST 1 VIEW COMPARISON:  08/10/2020 FINDINGS: Interstitial opacity with Awanda Mink lines and small right pleural effusion. Cardiomegaly. Prior CABG. IMPRESSION: CHF pattern. Electronically Signed   By: Monte Fantasia M.D.   On: 08/30/2020 04:41   DG Chest Port 1 View  Result Date: 08/08/2020 CLINICAL DATA:  Shortness of breath that started last night EXAM: PORTABLE CHEST 1 VIEW COMPARISON:  05/08/2020 FINDINGS: Normal heart size and mediastinal contours. Diffuse interstitial prominence similar to prior. Kerley lines are noted. No visible effusion or air leak. Artifact from EKG leads. IMPRESSION: Interstitial opacity favoring edema. Electronically Signed   By: Monte Fantasia M.D.   On: 08/08/2020 08:42     Microbiology: Recent Results (from the past 240 hour(s))  Resp Panel by RT-PCR (Flu A&B, Covid) Nasopharyngeal Swab     Status: None    Collection Time: 08/30/20  6:55 AM   Specimen: Nasopharyngeal Swab; Nasopharyngeal(NP) swabs in vial transport medium  Result Value Ref Range Status   SARS Coronavirus 2 by RT PCR NEGATIVE NEGATIVE Final    Comment: (NOTE) SARS-CoV-2 target nucleic acids are NOT DETECTED.  The SARS-CoV-2 RNA is generally detectable in upper respiratory specimens during the acute phase of infection. The lowest concentration of SARS-CoV-2 viral copies this assay can detect is 138 copies/mL. A negative result does not preclude SARS-Cov-2 infection and should not be used as the sole basis for treatment or other patient management decisions. A negative result may occur with  improper specimen collection/handling, submission of specimen other than nasopharyngeal swab, presence of viral mutation(s) within the areas targeted by this assay, and inadequate number of viral copies(<138 copies/mL). A negative result must be combined with clinical observations, patient history, and epidemiological information. The expected result is Negative.  Fact Sheet for Patients:  EntrepreneurPulse.com.au  Fact Sheet for Healthcare Providers:  IncredibleEmployment.be  This test is no t yet approved or cleared by the [place] FDA  and  has been authorized for detection and/or diagnosis of SARS-CoV-2 by FDA under an Emergency Use Authorization (EUA). This EUA will remain  in effect (meaning this test can be used) for the duration of the COVID-19 declaration under Section 564(b)(1) of the Act, 21 U.S.C.section 360bbb-3(b)(1), unless the authorization is terminated  or revoked sooner.       Influenza A by PCR NEGATIVE NEGATIVE Final   Influenza B by PCR NEGATIVE NEGATIVE Final    Comment: (NOTE) The Xpert Xpress SARS-CoV-2/FLU/RSV plus assay is intended as an aid in the diagnosis of influenza from Nasopharyngeal swab specimens and should not be used as a sole basis for treatment.  Nasal washings and aspirates are unacceptable for Xpert Xpress SARS-CoV-2/FLU/RSV testing.  Fact Sheet for Patients: EntrepreneurPulse.com.au  Fact Sheet for Healthcare Providers: IncredibleEmployment.be  This test is not yet approved or cleared by the [place] FDA and has been authorized for detection and/or diagnosis of SARS-CoV-2 by FDA under an Emergency Use Authorization (EUA). This EUA will remain in effect (meaning this test can be used) for the duration of the COVID-19 declaration under Section 564(b)(1) of the Act, 21 U.S.C. section 360bbb-3(b)(1), unless the authorization is terminated or revoked.  Performed at [facility], [address] [place], [place] [address]      Labs: Basic Metabolic Panel: Recent Labs  Lab 08/30/20 0449 08/30/20 0803 08/31/20 0625 09/01/20 0517 09/01/20 1422 09/02/20 0435 09/03/20 0424  NA 138  --  137 138  --  135 135  K 4.1  --  4.0 4.1  --  3.7 3.7  CL 104  --  102 103  --  101 100  CO2 21*  --  22 24  --  22 25  GLUCOSE 106*  --  97 103*  --  88 89  BUN 106*  --  106* 116*  --  73* 73*  CREATININE 4.29*  --  4.25* 4.32*  --  3.24* 3.42*  CALCIUM 9.2  --  9.1 8.9  --  8.6* 8.5*  MG  --  2.0  --   --   --   --   --   PHOS  --   --   --   --  4.7*  --   --    Liver Function Tests: Recent Labs  Lab 08/30/20 0449  AST 14*  ALT 11  ALKPHOS 75  BILITOT 1.1  PROT 7.0  ALBUMIN 3.6   No results for input(s): LIPASE, AMYLASE in the last 168 hours. No results for input(s): AMMONIA in the last 168 hours. CBC: Recent Labs  Lab 08/30/20 0449 09/01/20 0517 09/01/20 2221 09/02/20 1339 09/03/20 0424  WBC 5.8 4.5  --  3.8* 4.0  NEUTROABS 4.4  --   --   --   --   HGB 8.4* 6.7* 8.7* 8.5* 7.6*  HCT 26.6* 21.6* 26.5* 26.9* 24.2*  MCV 90.5 90.4  --  90.0 90.6  PLT 177 170  --  146* 142*   Cardiac Enzymes: No results for input(s): CKTOTAL, CKMB, CKMBINDEX, TROPONINI in the last 168  hours. BNP: Invalid input(s): POCBNP CBG: No results for input(s): GLUCAP in the last 168 hours.  Time coordinating discharge:  36 minutes  Signed:  Orson Eva, DO [facility] Pager: [phone] 09/03/2020, 11:01 AM

## 2020-09-03 NOTE — TOC Progression Note (Signed)
Transition of Care [facility]) - Progression Note    Patient Details  Name: [name] MRN: [id number] Date of Birth: Nov 20, 1945  Transition of Care [facility]) CM/SW Contact  Ihor Gully, LCSW Phone Number: 09/03/2020, 12:46 PM  Clinical Narrative:    [facility] Admissions indicates that patient has a chair time on MWF and needs to be there at 2:30 for 3 p.m. chair. Advised that they are awaiting for confirmation of acceptance from [facility].  Spoke with Jamas Lav with [facility]. Faxed HD clinicals to office. Advised that he would need auth/clearence from [place] to start HD. She indicated that she would send his social worker a Teams message advising of this. Advised that patient is scheduled to d/c today.    Expected Discharge Plan: Home/Self Care Barriers to Discharge: Continued Medical Work up  Expected Discharge Plan and Services Expected Discharge Plan: Home/Self Care         Expected Discharge Date: 09/03/20                                     Social Determinants of Health (SDOH) Interventions    Readmission Risk Interventions Readmission Risk Prevention Plan 08/09/2020  Transportation Screening Complete  Medication Review Press photographer) Complete  HRI or [place] Complete  SW Recovery Care/Counseling Consult Complete  Palliative Care Screening Not [place] Not Applicable  Some recent data might be hidden

## 2020-09-03 NOTE — TOC Progression Note (Signed)
Transition of Care [facility]) - Progression Note    Patient Details  Name: [name] MRN: [id number] Date of Birth: Feb 02, 1946  Transition of Care [facility]) CM/SW Contact  Ihor Gully, LCSW Phone Number: 09/03/2020, 4:51 PM  Clinical Narrative:    Patient will be TTS @ 11:30 at [facility]. Waiting on financial clearance from [place].TOC to call [facility] [phone] at [facility] and [place] services about clearance 4/22. Clinical documents faxed to [phone] to Jennifer's attention on 4/21.    Expected Discharge Plan: Home/Self Care Barriers to Discharge: Continued Medical Work up  Expected Discharge Plan and Services Expected Discharge Plan: Home/Self Care         Expected Discharge Date: 09/03/20                                     Social Determinants of Health (SDOH) Interventions    Readmission Risk Interventions Readmission Risk Prevention Plan 08/09/2020  Transportation Screening Complete  Medication Review Press photographer) Complete  HRI or [place] Complete  SW Recovery Care/Counseling Consult Complete  Palliative Care Screening Not [place] Not Applicable  Some recent data might be hidden

## 2020-09-03 NOTE — Progress Notes (Signed)
Nephrology Progress Note  Subjective:  Seen and examined on dialysis at 10:58 am.  Tolerating well this morning.  Spoke with his wife at bedside and they confirm he has been told of outpatient chair for HD.  Still might want to do PD - he's continuing to decide.  Review of systems:  Denies n/v No blood per rectum overnight  Denies shortness of breath or chest pain  Objective Vital signs in last 24 hours: Vitals:   09/03/20 1030 09/03/20 1100 09/03/20 1130 09/03/20 1200  BP: (!) 127/56 (!) 134/56 (!) 122/56 (!) 124/59  Pulse: 65 67 68 66  Resp: 18 18 18 18   Temp:      TempSrc:      SpO2:      Weight:      Height:       Weight change:   Intake/Output Summary (Last 24 hours) at 09/03/2020 1249 Last data filed at 09/03/2020 6568 Gross per 24 hour  Intake 639.52 ml  Output --  Net 639.52 ml    Assessment/Plan: 75 year old white male with COPD, CAD and advanced CKD.  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  followed closely as an outpatient by Dr. Theador Hawthorne.  Have started HD - first tx on 4/19 via AVF; per charting initially had planned for PD but he elected for initiating inpatient HD due to failure to thrive; transition to PD as outpatient if still desired  - outpatient HD is tentative MWF schedule per SW note - Would start lasix 80 mg daily on non-dialysis days - would adjust discharge instructions and I am contacting team - Patient reports outpatient HD is set up - would get confirmation from Education officer, museum.   2. Hypertension/volume  - optimize with HD; improved  3. Anemia of CKD -is present and likely contributing to his shortness of breath.  He received a dose of Aranesp. Iron deficiency.  S/p IV iron and PRBC's.    4. Metabolic bone disease -  PTH low at 72   Dispo: Patient reports outpatient HD is set up - would get confirmation from Education officer, museum.  Per last note was  tentative MWF spot at [facility].  Labs: Basic Metabolic Panel: Recent Labs  Lab 09/01/20 0517 09/01/20 1422 09/02/20 0435 09/03/20 0424  NA 138  --  135 135  K 4.1  --  3.7 3.7  CL 103  --  101 100  CO2 24  --  22 25  GLUCOSE 103*  --  88 89  BUN 116*  --  73* 73*  CREATININE 4.32*  --  3.24* 3.42*  CALCIUM 8.9  --  8.6* 8.5*  PHOS  --  4.7*  --   --    Liver Function Tests: Recent Labs  Lab 08/30/20 0449  AST 14*  ALT 11  ALKPHOS 75  BILITOT 1.1  PROT 7.0  ALBUMIN 3.6   No results for input(s): LIPASE, AMYLASE in the last 168 hours. No results for input(s): AMMONIA in the last 168 hours. CBC: Recent Labs  Lab 08/30/20 0449 09/01/20 0517 09/01/20 2221 09/02/20 1339 09/03/20 0424  WBC 5.8 4.5  --  3.8* 4.0  NEUTROABS 4.4  --   --   --   --   HGB 8.4* 6.7* 8.7* 8.5* 7.6*  HCT 26.6* 21.6* 26.5* 26.9* 24.2*  MCV 90.5 90.4  --  90.0 90.6  PLT 177 170  --  146* 142*   Cardiac Enzymes: No results for input(s): CKTOTAL, CKMB, CKMBINDEX, TROPONINI in the last 168 hours. CBG: No results for input(s): GLUCAP in the last 168 hours.  Iron Studies:  Recent Labs    09/01/20 0517  IRON 21*  TIBC 204*  FERRITIN 416*   Studies/Results: No results found. Medications: Infusions: . sodium chloride    . sodium chloride    . sodium chloride    . sodium chloride 250 mL (09/02/20 1007)  . ferric gluconate (FERRLECIT/NULECIT) IV 125 mg (09/03/20 0835)    Scheduled Medications: . allopurinol  100 mg Oral Daily  . atorvastatin  80 mg Oral Daily  . carvedilol  3.125 mg Oral BID WC  . Chlorhexidine Gluconate Cloth  6 each Topical Q0600  . Chlorhexidine Gluconate Cloth  6 each Topical Q0600  . cholecalciferol  1,000 Units Oral Daily  . finasteride  5 mg Oral Daily  . folic acid  1 mg Oral Daily  . hydrALAZINE  100 mg Oral TID  . hydrocortisone  25 mg Rectal BID  . isosorbide mononitrate  120 mg Oral Daily  . levothyroxine  50 mcg Oral QAC breakfast  . magnesium  oxide  400 mg Oral Daily  . omega-3 acid ethyl esters  2,000 mg Oral BID  . pantoprazole  40 mg Oral Daily  . sodium chloride flush  3 mL Intravenous Q12H  . tamsulosin  0.4 mg Oral Daily    have reviewed scheduled and prn medications.  Physical Exam:  General adult male in bed in no acute distress HEENT normocephalic atraumatic extraocular movements intact sclera anicteric Neck supple trachea midline Lungs clear to auscultation bilaterally normal work of breathing at rest on room air  Heart S1S2 no rubs or gallops appreciated Abdomen soft nontender nondistended Extremities no edema  Psych normal mood and affect Neuro - alert and oriented x 3 provides hx and follows commands Access: right UE AVF in use  Claudia Desanctis, MD 09/03/2020,12:49 PM

## 2020-09-03 NOTE — Progress Notes (Signed)
Subjective:  Feels ok. No complaints. No BM since procedure yesterday.   Objective: Vital signs in last 24 hours: Temp:  [97.5 F (36.4 C)-97.8 F (36.6 C)] 97.6 F (36.4 C) (04/21 0830) Pulse Rate:  [65-85] 70 (04/21 0830) Resp:  [15-20] 16 (04/21 0830) BP: (117-155)/(48-65) 127/48 (04/21 0830) SpO2:  [94 %-100 %] 100 % (04/21 0830) Last BM Date: 09/01/20 General:   Alert,  Well-developed, well-nourished, pleasant and cooperative in NAD Head:  Normocephalic and atraumatic. Eyes:  Sclera clear, no icterus.  Abdomen:  Soft Extremities:  Without clubbing, deformity or edema. Neurologic:  Alert and  oriented x4;  grossly normal neurologically. Psych:  Alert and cooperative. Normal mood and affect.  Intake/Output from previous day: 04/20 0701 - 04/21 0700 In: 636.5 [P.O.:630; I.V.:0.4; IV Piggyback:6.1] Out: 200 [Urine:200] Intake/Output this shift: Total I/O In: 3 [I.V.:3] Out: -   Lab Results: CBC Recent Labs    09/01/20 0517 09/01/20 2221 09/02/20 1339 09/03/20 0424  WBC 4.5  --  3.8* 4.0  HGB 6.7* 8.7* 8.5* 7.6*  HCT 21.6* 26.5* 26.9* 24.2*  MCV 90.4  --  90.0 90.6  PLT 170  --  146* 142*   BMET Recent Labs    09/01/20 0517 09/02/20 0435 09/03/20 0424  NA 138 135 135  K 4.1 3.7 3.7  CL 103 101 100  CO2 24 22 25   GLUCOSE 103* 88 89  BUN 116* 73* 73*  CREATININE 4.32* 3.24* 3.42*  CALCIUM 8.9 8.6* 8.5*   LFTs No results for input(s): BILITOT, BILIDIR, IBILI, ALKPHOS, AST, ALT, PROT, ALBUMIN in the last 72 hours. No results for input(s): LIPASE in the last 72 hours. PT/INR No results for input(s): LABPROT, INR in the last 72 hours.    Imaging Studies: DG Chest 2 View  Result Date: 08/10/2020 CLINICAL DATA:  Shortness of breath. EXAM: CHEST - 2 VIEW COMPARISON:  08/08/2020. FINDINGS: Trachea is midline. Heart is mildly enlarged. Thoracic aorta is calcified. Mild interstitial prominence and indistinctness appear similar. Small bilateral pleural  effusions, new or increased. IMPRESSION: 1. Congestive heart failure. 2.  Aortic atherosclerosis (ICD10-I70.0). Electronically Signed   By: Lorin Picket M.D.   On: 08/10/2020 08:50   DG Chest Portable 1 View  Result Date: 08/30/2020 CLINICAL DATA:  Shortness of breath since Friday EXAM: PORTABLE CHEST 1 VIEW COMPARISON:  08/10/2020 FINDINGS: Interstitial opacity with Awanda Mink lines and small right pleural effusion. Cardiomegaly. Prior CABG. IMPRESSION: CHF pattern. Electronically Signed   By: Monte Fantasia M.D.   On: 08/30/2020 04:41   DG Chest Port 1 View  Result Date: 08/08/2020 CLINICAL DATA:  Shortness of breath that started last night EXAM: PORTABLE CHEST 1 VIEW COMPARISON:  05/08/2020 FINDINGS: Normal heart size and mediastinal contours. Diffuse interstitial prominence similar to prior. Kerley lines are noted. No visible effusion or air leak. Artifact from EKG leads. IMPRESSION: Interstitial opacity favoring edema. Electronically Signed   By: Monte Fantasia M.D.   On: 08/08/2020 08:42  [2 weeks]   Assessment: Pleasant 75 year old male presented to the ED with shortness of breath, rectal bleeding and admitted with acute respiratory failure with hypoxia in the setting of acute on chronic heart failure, acute on chronic renal failure.  GI consulted due to rectal bleeding.  Rectal bleeding: Started April 15 with bright red blood noted on the stool each time he had a bowel movement.  Hemoglobin on admission was 8.4 similar to prior outpatient values.  Hemoglobin declined to 6.7 during admission.  He was  given 1 unit of red blood cells.  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  Anemia: Multifactorial in the setting of chronic disease, renal renal disease, GI bleeding.  Ferritin 416, iron 21, iron saturation is 10%.  Received  Feraheme February 2022. Ferric gluconate daily for 3 days so far this admission.  Routinely receives Epogen as well.  Received 1 unit of packed red blood cells this admission. Hemoglobin up approximately 1 g/dL as expected.   Plan: 1. Serial H&H is necessary as an outpatient. 2. Resume Plavix in 3 days. 3. Monitor for further bleeding. 4. Follow up on path as available. 5. Patient to follow-up with GI in [place] where he has established per his wishes.  He is aware that if he needs any additional recommendations, he can reach out to [place] locally. 6. Will sign off.  Call with any questions.  Laureen Ochs. Bernarda Caffey [facility] [phone] 4/21/202210:57 AM     LOS: 4 days

## 2020-09-03 NOTE — TOC Progression Note (Signed)
Transition of Care [facility]) - Progression Note    Patient Details  Name: [name] MRN: [id number] Date of Birth: 1945/09/11  [facility] CM/SW Contact  Ihor Gully, LCSW Phone Number: 09/03/2020, 1:31 PM  Clinical Narrative:    Spoke with Joy with [facility]. Joy advised that TOC should not take chair times from [facility] as they are frequently incorrect. Advised that patient will NOT be MWF. She stated that patient will be TTS @ 11:30 a.m. and he is medically cleared to come but he needs financial clearance from guest services. Joy stated that they were awaiting [facility] clearance. Advised Joy that TOC had spoken with  Dr. Evelene Croon (PCP) nurse, Jamas Lav, and advised of patient's HD needs and that clinicals had previously been sent for PCP's review and clearance.  Joy advised that patient could not be discharged from hospital until [facility] provided financial clearance.     Expected Discharge Plan: Home/Self Care Barriers to Discharge: Continued Medical Work up  Expected Discharge Plan and Services Expected Discharge Plan: Home/Self Care         Expected Discharge Date: 09/03/20                                     Social Determinants of Health (SDOH) Interventions    Readmission Risk Interventions Readmission Risk Prevention Plan 08/09/2020  Transportation Screening Complete  Medication Review Press photographer) Complete  HRI or [place] Complete  SW Recovery Care/Counseling Consult Complete  Palliative Care Screening Not [place] Not Applicable  Some recent data might be hidden

## 2020-09-04 ENCOUNTER — Encounter (HOSPITAL_COMMUNITY): Payer: Self-pay | Admitting: Internal Medicine

## 2020-09-04 DIAGNOSIS — I5023 Acute on chronic systolic (congestive) heart failure: Secondary | ICD-10-CM

## 2020-09-04 LAB — BASIC METABOLIC PANEL
Anion gap: 8 (ref 5–15)
BUN: 50 mg/dL — ABNORMAL HIGH (ref 8–23)
CO2: 26 mmol/L (ref 22–32)
Calcium: 8.2 mg/dL — ABNORMAL LOW (ref 8.9–10.3)
Chloride: 99 mmol/L (ref 98–111)
Creatinine, Ser: 3.09 mg/dL — ABNORMAL HIGH (ref 0.61–1.24)
GFR, Estimated: 20 mL/min — ABNORMAL LOW (ref >60)
Glucose, Bld: 86 mg/dL (ref 70–99)
Potassium: 4.1 mmol/L (ref 3.5–5.1)
Sodium: 133 mmol/L — ABNORMAL LOW (ref 135–145)

## 2020-09-04 LAB — CBC
HCT: 22.1 % — ABNORMAL LOW (ref 39.0–52.0)
Hemoglobin: 7 g/dL — ABNORMAL LOW (ref 13.0–17.0)
MCH: 28.8 pg (ref 26.0–34.0)
MCHC: 31.7 g/dL (ref 30.0–36.0)
MCV: 90.9 fL (ref 80.0–100.0)
Platelets: 94 10*3/uL — ABNORMAL LOW (ref 150–400)
RBC: 2.43 MIL/uL — ABNORMAL LOW (ref 4.22–5.81)
RDW: 17.2 % — ABNORMAL HIGH (ref 11.5–15.5)
WBC: 4 10*3/uL (ref 4.0–10.5)
nRBC: 0 % (ref 0.0–0.2)

## 2020-09-04 LAB — PREPARE RBC (CROSSMATCH)

## 2020-09-04 MED ORDER — FUROSEMIDE 80 MG PO TABS: 80.0000 mg | ORAL_TABLET | ORAL | Status: AC

## 2020-09-04 MED ORDER — SODIUM CHLORIDE 0.9% IV SOLUTION: Freq: Once | INTRAVENOUS | Status: AC

## 2020-09-04 MED ORDER — CHLORHEXIDINE GLUCONATE CLOTH 2 % EX PADS: 6.0000 | MEDICATED_PAD | Freq: Every day | CUTANEOUS | Status: DC

## 2020-09-04 MED ORDER — LIVING BETTER WITH HEART FAILURE BOOK: Freq: Once | Status: AC

## 2020-09-04 MED ORDER — FUROSEMIDE 80 MG PO TABS: 80.0000 mg | ORAL_TABLET | ORAL | Status: DC

## 2020-09-04 NOTE — Progress Notes (Signed)
Subjective: Doing well today. Hoping to go home soon, just found out he has outpatient HD chair at the [place] confirmed. Denies any further rectal bleeding. No other GI symptoms at this time.  Objective: Vital signs in last 24 hours: Temp:  [97.6 F (36.4 C)-98.4 F (36.9 C)] 97.8 F (36.6 C) (04/22 0811) Pulse Rate:  [65-72] 66 (04/22 0811) Resp:  [16-18] 16 (04/22 0811) BP: (114-143)/(46-57) 143/46 (04/22 0811) SpO2:  [97 %-99 %] 97 % (04/22 0811) Weight:  [83.1 kg] 83.1 kg (04/22 0500) Last BM Date: 09/02/20 General:   Alert and oriented, pleasant Head:  Normocephalic and atraumatic. Eyes:  No icterus, sclera clear. Conjuctiva pink.  Heart:  S1, S2 present, no murmurs noted.  Lungs: Clear to auscultation bilaterally, without wheezing, rales, or rhonchi.  Abdomen:  Bowel sounds present, soft, non-tender, non-distended. No HSM or hernias noted. No rebound or guarding. No masses appreciated  Msk:  Symmetrical without gross deformities. Extremities:  Without clubbing or edema. Neurologic:  Alert and  oriented x4;  grossly normal neurologically. Psych:  Alert and cooperative. Normal mood and affect.  Intake/Output from previous day: 04/21 0701 - 04/22 0700 In: 113.1 [I.V.:3; IV Piggyback:110.1] Out: 1400 [Urine:400] Intake/Output this shift: No intake/output data recorded.  Lab Results: Recent Labs    09/02/20 1339 09/03/20 0424 09/04/20 0402  WBC 3.8* 4.0 4.0  HGB 8.5* 7.6* 7.0*  HCT 26.9* 24.2* 22.1*  PLT 146* 142* 94*   BMET Recent Labs    09/02/20 0435 09/03/20 0424 09/04/20 0402  NA 135 135 133*  K 3.7 3.7 4.1  CL 101 100 99  CO2 22 25 26   GLUCOSE 88 89 86  BUN 73* 73* 50*  CREATININE 3.24* 3.42* 3.09*  CALCIUM 8.6* 8.5* 8.2*   LFT No results for input(s): PROT, ALBUMIN, AST, ALT, ALKPHOS, BILITOT, BILIDIR, IBILI in the last 72 hours. PT/INR No results for input(s): LABPROT, INR in the last 72 hours. Hepatitis Panel Recent Labs    09/01/20 1422   HEPBSAG NON REACTIVE  HCVAB NON REACTIVE     Studies/Results: No results found.  Assessment: Pleasant 75 year old male presented to the ED with shortness of breath, rectal bleeding and admitted with acute respiratory failure with hypoxia in the setting of acute on chronic heart failure, acute on chronic renal failure.  GI consulted due to rectal bleeding.  Rectal bleeding: Started April 15 with bright red blood noted on the stool each time he had a bowel movement.  Hemoglobin on admission was 8.4 similar to prior outpatient values.  Hemoglobin declined to 6.7 during admission.  He was given 1 unit of red blood cells.  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 Over the past 2 days his hgb drifted from 8.5 -> 7.6 -> 7.0. Denies any further bleeding. Likely equilibration or hydration effect. Remains normocytic and normochronic.  Anemia: Multifactorial in the setting of chronic disease, renal renal disease, GI bleeding.  Ferritin 416, iron 21, iron saturation is 10%.  Received Feraheme February 2022. Ferric gluconate daily for 3 days so far this admission.  Routinely receives Epogen as well.  Received 1 unit of packed red blood cells this admission. Hemoglobin up approximately 1 g/dL as expected. Has since drifted over the past couple days with no recurrent bleed noted, as per above.  Plan: 1. Can transfuse if  felt necessary before discharge 2. Plan outpatient GI follow-up in 4-6 weeks 3. Supportive care 4. Patient instructed to call if any noted recurrent bleeding 5. Anticipate d/c in the next 24-48 hours   Thank you for allowing [place] to participate in the care of Ranell Patrick, DNP, AGNP-C Adult & Gerontological Nurse Practitioner [facility]     LOS: 5 days    09/04/2020, 2:05  PM

## 2020-09-04 NOTE — Progress Notes (Signed)
Discharge instructions reviewed with patient, patient verbalized understanding of instructions. Patient discharged home with family in stable condition.  

## 2020-09-04 NOTE — Discharge Summary (Addendum)
Physician Discharge Summary  [name] BJY:[id number] DOB: 04/28/1946 DOA: 08/30/2020  PCP: [facility]  Admit date: 08/30/2020 Discharge date: 09/04/2020  Admitted From: Home Disposition:  Home   Recommendations for Outpatient Follow-up:  1. Follow up with PCP in 1-2 weeks 2. Please obtain BMP/CBC in one week    Discharge Condition: Stable CODE STATUS: FULL Diet recommendation: Heart Healthy   Brief/Interim Summary: 75 y.o.malewith medical history significant ofcoronary artery disease (status post CABG), history of COPD (not chronically on oxygen), hypertension, chronic diastolic heart failure BPH and chronic kidney disease stage V;who presented to the hospital secondary to shortness of breath. Patient reports that for the last 4 days or so he has been experiencing increased shortness of breath, dyspnea on exertion and orthopnea. Patient expressed being compliant with his medications and low-sodium diet. On presentation to ED, hisoxygen saturation was below 90% on room air; stabilizing the mid 90s on 2 L supplementation. Patient reported newly seen intermittent episodes of BRBPR and some blood when he wipes himself.  Patient denies chest pain, nausea, vomiting, dysuria, hematuria, melena, hematemesis, abdominal pain, sick contacts or any other complaints.  Initially started on IV lasix with some clinical improvement, but serum creatinine continued to rise.  Nephrology was consulted and patient was initiated on HD this hospital admission.  He tolerated it well.  GI was consulted due to his hematochezia and he underwent colonoscopy on 09/02/20 which revealed cecal Diuelafloy's lesions s/p APC.  He plavix was held temporarily.   ED Course:Elevated BNP, worsening renal function (with increased creatinine from baseline) appreciated and chest x-ray demonstrating vascular congestion and CHF pattern. IV Lasix x1 was given in the ED and [facility] has been contacted to place  patient in the hospital for further evaluation and management of acute on chronic diastolic heart failure with concern for cardiorenal syndrome.   Discharge Diagnoses:  acute respiratory failure with hypoxia  -initially on 2L -initially onIV diuresis--last dose 4/19 -now stable on RA  Acute on chronic diastolic CHF -08/65/78 Echo--EF 55-60%, G1DD, PASP 47.7; mod AS -initially started on IV lasix with clinical improvement but worsen renal function-->renal consulted -now on HD due to ESRD -lasix 80 po daily on non-HD days  acute on chronic renal failure--CKD 5>>>ESRD -prior baseline 3.6-3.9 -appreciate nephrology -patient in agreement with HD initiation;first hemodialysis treatment provided4/19 -TOC working with outpatient HD placement>>TTS -last HD 09/03/20 -social work set up outpatient HD at [employer] [facility] on TTS -next HD at outpatient center scheduled 09/05/20  Hematochezia/lower GI bleed -started 08/28/20 -GI consulted -09/02/20 colonoscopy--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 -Hemoglobinnadir6.3 -1 unit PRBC with dialysis given4/19 and on 4/22 (2 total for the admission) -GI service consulted and pt had colonoscopy evaluation 4/20. -IV iron and Epogen as per nephrology  essential hypertension -Stable -Continue coreg, hydralazine, imdur -d/c amlodipine  BPH -No complaints of urinary retention -Continue Proscar and Flomax.  coronary artery disease -Remains chest pain-free -Continue beta-blocker, statins, Imdur, hydralazine and as needed nitroglycerin.  gastroesophageal  disease -Continue PPI  COPD without exacerbation -Continue as needed bronchodilators -no wheezing; stable on RA  obstructive sleep apnea -Continue nightly CPAP.  hypothyroidism -Continue Synthroid.  Discharge Instructions   Allergies as of  09/04/2020      Reactions   Cardizem Cd [diltiazem Hcl Er Beads] Palpitations   "Makes heart skip"      Medication List    STOP taking these medications   amLODipine 10 MG tablet Commonly known as: NORVASC   Klor-Con M20 20 MEQ tablet Generic drug: potassium chloride SA     TAKE these medications   acetaminophen 325 MG tablet Commonly known as: TYLENOL Take 2 tablets (650 mg total) by mouth every 6 (six) hours as needed for mild pain.   allopurinol 100 MG tablet Commonly known as: ZYLOPRIM Take 100 mg by mouth daily.   atorvastatin 80 MG tablet Commonly known as: LIPITOR Take 80 mg by mouth daily.   carvedilol 3.125 MG tablet Commonly known as: Coreg Take 1 tablet (3.125 mg total) by mouth 2 (two) times daily.   clopidogrel 75 MG tablet Commonly known as: PLAVIX Take 75 mg by mouth daily.   epoetin alfa 20000 UNIT/ML injection Commonly known as: EPOGEN Inject 20,000 Units into the skin every Monday.   finasteride 5 MG tablet Commonly known as: PROSCAR Take 5 mg by mouth daily.   folic acid 1 MG tablet Commonly known as: FOLVITE Take 1 mg by mouth daily.   furosemide 80 MG tablet Commonly known as: LASIX Take 1 tablet (80 mg total) by mouth every Tuesday, Thursday, Saturday, and Sunday. Start taking on: September 05, 2020 What changed:   medication strength  how much to take  when to take this   hydrALAZINE 100 MG tablet Commonly known as: APRESOLINE Take 1 tablet (100 mg total) by mouth 3 (three) times daily.   isosorbide mononitrate 120 MG 24 hr tablet Commonly known as: IMDUR Take 1 tablet (120 mg total) by mouth daily. What changed: how much to take   levothyroxine 50 MCG tablet Commonly known  as: SYNTHROID Take 50 mcg by mouth daily before breakfast.   magnesium oxide 400 MG tablet Commonly known as: MAG-OX Take 1 tablet (400 mg total) by mouth daily.   nitroGLYCERIN 0.4 MG SL tablet Commonly known as: NITROSTAT Place 0.4 mg under the tongue every 5 (five) minutes as needed for chest pain (max 3 doses).   omeprazole 20 MG capsule Commonly known as: PRILOSEC Take 20 mg by mouth 2 (two) times daily.   RA Fish Oil 1000 MG Caps Take 2,000 mg by mouth 2 (two) times daily.   tamsulosin 0.4 MG Caps capsule Commonly known as: FLOMAX Take 0.4 mg by mouth daily.   Vitamin D (Cholecalciferol) 25 MCG (1000 UT) Caps Take 1,000 Units by mouth daily.       Allergies  Allergen Reactions  . Cardizem Cd [Diltiazem Hcl Er Beads] Palpitations    "Makes heart skip"    Consultations:  GI  renal   Procedures/Studies: DG Chest 2 View  Result Date: 08/10/2020 CLINICAL DATA:  Shortness of breath. EXAM: CHEST - 2 VIEW COMPARISON:  08/08/2020. FINDINGS: Trachea is midline. Heart is mildly enlarged. Thoracic aorta is calcified. Mild interstitial prominence and indistinctness appear similar. Small bilateral pleural effusions, new or increased. IMPRESSION: 1. Congestive heart failure. 2.  Aortic atherosclerosis (ICD10-I70.0). Electronically Signed   By: Lorin Picket M.D.   On: 08/10/2020 08:50   DG Chest Portable 1 View  Result Date: 08/30/2020 CLINICAL DATA:  Shortness of breath since Friday EXAM: PORTABLE CHEST 1 VIEW COMPARISON:  08/10/2020 FINDINGS: Interstitial opacity with Awanda Mink lines and small right pleural effusion. Cardiomegaly. Prior CABG. IMPRESSION: CHF pattern. Electronically Signed  By: Monte Fantasia M.D.   On: 08/30/2020 04:41   DG Chest Port 1 View  Result Date: 08/08/2020 CLINICAL DATA:  Shortness of breath that started last night EXAM: PORTABLE CHEST 1 VIEW COMPARISON:  05/08/2020 FINDINGS: Normal heart size and mediastinal contours. Diffuse interstitial  prominence similar to prior. Kerley lines are noted. No visible effusion or air leak. Artifact from EKG leads. IMPRESSION: Interstitial opacity favoring edema. Electronically Signed   By: Monte Fantasia M.D.   On: 08/08/2020 08:42         Discharge Exam: Vitals:   09/04/20 0448 09/04/20 0811  BP: (!) 125/48 (!) 143/46  Pulse: 65 66  Resp: 16 16  Temp: 97.6 F (36.4 C) 97.8 F (36.6 C)  SpO2: 98% 97%   Vitals:   09/03/20 2100 09/04/20 0448 09/04/20 0500 09/04/20 0811  BP: (!) 114/56 (!) 125/48  (!) 143/46  Pulse: 67 65  66  Resp: 18 16  16   Temp: 98.4 F (36.9 C) 97.6 F (36.4 C)  97.8 F (36.6 C)  TempSrc:  Oral  Oral  SpO2: 99% 98%  97%  Weight:   83.1 kg   Height:        General: Pt is alert, awake, not in acute distress Cardiovascular: RRR, S1/S2 +, no rubs, no gallops Respiratory: CTA bilaterally, no wheezing, no rhonchi Abdominal: Soft, NT, ND, bowel sounds + Extremities: no edema, no cyanosis   The results of significant diagnostics from this hospitalization (including imaging, microbiology, ancillary and laboratory) are listed below for reference.    Significant Diagnostic Studies: DG Chest 2 View  Result Date: 08/10/2020 CLINICAL DATA:  Shortness of breath. EXAM: CHEST - 2 VIEW COMPARISON:  08/08/2020. FINDINGS: Trachea is midline. Heart is mildly enlarged. Thoracic aorta is calcified. Mild interstitial prominence and indistinctness appear similar. Small bilateral pleural effusions, new or increased. IMPRESSION: 1. Congestive heart failure. 2.  Aortic atherosclerosis (ICD10-I70.0). Electronically Signed   By: Lorin Picket M.D.   On: 08/10/2020 08:50   DG Chest Portable 1 View  Result Date: 08/30/2020 CLINICAL DATA:  Shortness of breath since Friday EXAM: PORTABLE CHEST 1 VIEW COMPARISON:  08/10/2020 FINDINGS: Interstitial opacity with Awanda Mink lines and small right pleural effusion. Cardiomegaly. Prior CABG. IMPRESSION: CHF pattern. Electronically Signed    By: Monte Fantasia M.D.   On: 08/30/2020 04:41   DG Chest Port 1 View  Result Date: 08/08/2020 CLINICAL DATA:  Shortness of breath that started last night EXAM: PORTABLE CHEST 1 VIEW COMPARISON:  05/08/2020 FINDINGS: Normal heart size and mediastinal contours. Diffuse interstitial prominence similar to prior. Kerley lines are noted. No visible effusion or air leak. Artifact from EKG leads. IMPRESSION: Interstitial opacity favoring edema. Electronically Signed   By: Monte Fantasia M.D.   On: 08/08/2020 08:42     Microbiology: Recent Results (from the past 240 hour(s))  Resp Panel by RT-PCR (Flu A&B, Covid) Nasopharyngeal Swab     Status: None   Collection Time: 08/30/20  6:55 AM   Specimen: Nasopharyngeal Swab; Nasopharyngeal(NP) swabs in vial transport medium  Result Value Ref Range Status   SARS Coronavirus 2 by RT PCR NEGATIVE NEGATIVE Final    Comment: (NOTE) SARS-CoV-2 target nucleic acids are NOT DETECTED.  The SARS-CoV-2 RNA is generally detectable in upper respiratory specimens during the acute phase of infection. The lowest concentration of SARS-CoV-2 viral copies this assay can detect is 138 copies/mL. A negative result does not preclude SARS-Cov-2 infection and should not be used as the sole  basis for treatment or other patient management decisions. A negative result may occur with  improper specimen collection/handling, submission of specimen other than nasopharyngeal swab, presence of viral mutation(s) within the areas targeted by this assay, and inadequate number of viral copies(<138 copies/mL). A negative result must be combined with clinical observations, patient history, and epidemiological information. The expected result is Negative.  Fact Sheet for Patients:  EntrepreneurPulse.com.au  Fact Sheet for Healthcare Providers:  IncredibleEmployment.be  This test is no t yet approved or cleared by the [place] FDA and  has  been authorized for detection and/or diagnosis of SARS-CoV-2 by FDA under an Emergency Use Authorization (EUA). This EUA will remain  in effect (meaning this test can be used) for the duration of the COVID-19 declaration under Section 564(b)(1) of the Act, 21 U.S.C.section 360bbb-3(b)(1), unless the authorization is terminated  or revoked sooner.       Influenza A by PCR NEGATIVE NEGATIVE Final   Influenza B by PCR NEGATIVE NEGATIVE Final    Comment: (NOTE) The Xpert Xpress SARS-CoV-2/FLU/RSV plus assay is intended as an aid in the diagnosis of influenza from Nasopharyngeal swab specimens and should not be used as a sole basis for treatment. Nasal washings and aspirates are unacceptable for Xpert Xpress SARS-CoV-2/FLU/RSV testing.  Fact Sheet for Patients: EntrepreneurPulse.com.au  Fact Sheet for Healthcare Providers: IncredibleEmployment.be  This test is not yet approved or cleared by the [place] FDA and has been authorized for detection and/or diagnosis of SARS-CoV-2 by FDA under an Emergency Use Authorization (EUA). This EUA will remain in effect (meaning this test can be used) for the duration of the COVID-19 declaration under Section 564(b)(1) of the Act, 21 U.S.C. section 360bbb-3(b)(1), unless the authorization is terminated or revoked.  Performed at [facility], [address], [place], [place] [address]      Labs: Basic Metabolic Panel: Recent Labs  Lab 08/30/20 0803 08/31/20 0625 09/01/20 0517 09/01/20 1422 09/02/20 0435 09/03/20 0424 09/04/20 0402  NA  --  137 138  --  135 135 133*  K  --  4.0 4.1  --  3.7 3.7 4.1  CL  --  102 103  --  101 100 99  CO2  --  22 24  --  22 25 26   GLUCOSE  --  97 103*  --  88 89 86  BUN  --  106* 116*  --  73* 73* 50*  CREATININE  --  4.25* 4.32*  --  3.24* 3.42* 3.09*  CALCIUM  --  9.1 8.9  --  8.6* 8.5* 8.2*  MG 2.0  --   --   --   --   --   --   PHOS  --   --   --  4.7*  --    --   --    Liver Function Tests: Recent Labs  Lab 08/30/20 0449  AST 14*  ALT 11  ALKPHOS 75  BILITOT 1.1  PROT 7.0  ALBUMIN 3.6   No results for input(s): LIPASE, AMYLASE in the last 168 hours. No results for input(s): AMMONIA in the last 168 hours. CBC: Recent Labs  Lab 08/30/20 0449 09/01/20 0517 09/01/20 2221 09/02/20 1339 09/03/20 0424 09/04/20 0402  WBC 5.8 4.5  --  3.8* 4.0 4.0  NEUTROABS 4.4  --   --   --   --   --   HGB 8.4* 6.7* 8.7* 8.5* 7.6* 7.0*  HCT 26.6* 21.6* 26.5* 26.9* 24.2* 22.1*  MCV 90.5 90.4  --  90.0 90.6 90.9  PLT 177 170  --  146* 142* 94*   Cardiac Enzymes: No results for input(s): CKTOTAL, CKMB, CKMBINDEX, TROPONINI in the last 168 hours. BNP: Invalid input(s): POCBNP CBG: No results for input(s): GLUCAP in the last 168 hours.  Time coordinating discharge:  36 minutes  Signed:  Orson Eva, DO [facility] Pager: [phone] 09/04/2020, 1:24 PM

## 2020-09-04 NOTE — Progress Notes (Signed)
Patient does not wish to wear to CPAP.  RT will continue to monitor.

## 2020-09-04 NOTE — Progress Notes (Addendum)
Nephrology Progress Note  Subjective:   Per SW note they are still awaiting clearance from the [place] - so he doesn't actually have an outpatient chair yet.  He feels ok today.  He had 2.5 hrs HD on 4/21 with 1 kg UF.   Review of systems: Denies n/v No blood per rectum overnight - states no BM since colonoscopy prep Denies shortness of breath or chest pain  Objective Vital signs in last 24 hours: Vitals:   09/03/20 2100 09/04/20 0448 09/04/20 0500 09/04/20 0811  BP: (!) 114/56 (!) 125/48  (!) 143/46  Pulse: 67 65  66  Resp: 18 16  16   Temp: 98.4 F (36.9 C) 97.6 F (36.4 C)  97.8 F (36.6 C)  TempSrc:  Oral  Oral  SpO2: 99% 98%  97%  Weight:   83.1 kg   Height:       Weight change:   Intake/Output Summary (Last 24 hours) at 09/04/2020 0844 Last data filed at 09/04/2020 0500 Gross per 24 hour  Intake 113.1 ml  Output 1400 ml  Net -1286.9 ml    Assessment/Plan: 75 year old white male with COPD, CAD and advanced CKD.  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  followed closely as an outpatient by Dr. Theador Hawthorne.  Have started HD - first tx on 4/19 via AVF; per charting initially had planned for PD but he elected for initiating inpatient HD due to failure to thrive; transition to PD as outpatient if still desired  - HD today, 4/22 to get on his new tentative MWF schedule - outpatient HD is tentative MWF schedule at [facility] per SW note - to clarify he does not yet have financial clearance and is not able to be discharged until a definite outpatient HD spot with financial clearance is confirmed  - Will transition lasix 80 mg daily on non-dialysis days -note please adjust discharge instructions  2. Hypertension/volume  - optimize with HD and continue current regimen noting adjustment to lasix 80 mg daily only on non-HD days  3. Anemia of CKD -is present and likely contributing  to his shortness of breath.  He received a dose of Aranesp. Iron deficiency.  S/p IV iron and PRBC's.  Update CBC  4. Metabolic bone disease -  PTH low at 72   Dispo: pending financial clearance for outpatient dialysis.  SW is working on this.  not able to be discharged until a definite outpatient HD spot with financial clearance is confirmed   Nephrology is available over the weekend if needed but will not formally round on the patient unless needed.    Labs: Basic Metabolic Panel: Recent Labs  Lab 09/01/20 1422 09/02/20 0435 09/03/20 0424 09/04/20 0402  NA  --  135 135 133*  K  --  3.7 3.7 4.1  CL  --  101 100 99  CO2  --  22 25 26   GLUCOSE  --  88 89 86  BUN  --  73* 73* 50*  CREATININE  --  3.24* 3.42* 3.09*  CALCIUM  --  8.6* 8.5* 8.2*  PHOS 4.7*  --   --   --    Liver Function Tests: Recent Labs  Lab 08/30/20 0449  AST 14*  ALT 11  ALKPHOS 75  BILITOT 1.1  PROT 7.0  ALBUMIN 3.6   No results for input(s): LIPASE, AMYLASE in the last 168 hours. No  results for input(s): AMMONIA in the last 168 hours. CBC: Recent Labs  Lab 08/30/20 0449 09/01/20 0517 09/01/20 2221 09/02/20 1339 09/03/20 0424  WBC 5.8 4.5  --  3.8* 4.0  NEUTROABS 4.4  --   --   --   --   HGB 8.4* 6.7* 8.7* 8.5* 7.6*  HCT 26.6* 21.6* 26.5* 26.9* 24.2*  MCV 90.5 90.4  --  90.0 90.6  PLT 177 170  --  146* 142*   Cardiac Enzymes: No results for input(s): CKTOTAL, CKMB, CKMBINDEX, TROPONINI in the last 168 hours. CBG: No results for input(s): GLUCAP in the last 168 hours.  Iron Studies:  No results for input(s): IRON, TIBC, TRANSFERRIN, FERRITIN in the last 72 hours. Studies/Results: No results found. Medications: Infusions: . sodium chloride    . sodium chloride    . sodium chloride    . sodium chloride 250 mL (09/02/20 1007)  . ferric gluconate (FERRLECIT/NULECIT) IV 125 mg (09/03/20 0835)    Scheduled Medications: . allopurinol  100 mg Oral Daily  . atorvastatin  80 mg Oral  Daily  . carvedilol  3.125 mg Oral BID WC  . Chlorhexidine Gluconate Cloth  6 each Topical Q0600  . Chlorhexidine Gluconate Cloth  6 each Topical Q0600  . cholecalciferol  1,000 Units Oral Daily  . finasteride  5 mg Oral Daily  . folic acid  1 mg Oral Daily  . hydrALAZINE  100 mg Oral TID  . hydrocortisone  25 mg Rectal BID  . isosorbide mononitrate  120 mg Oral Daily  . levothyroxine  50 mcg Oral QAC breakfast  . magnesium oxide  400 mg Oral Daily  . omega-3 acid ethyl esters  2,000 mg Oral BID  . pantoprazole  40 mg Oral Daily  . sodium chloride flush  3 mL Intravenous Q12H  . tamsulosin  0.4 mg Oral Daily    have reviewed scheduled and prn medications.  Physical Exam:  General adult male in bed in no acute distress  HEENT normocephalic atraumatic extraocular movements intact sclera anicteric Neck supple trachea midline Lungs basilar crackles otherwise clear normal work of breathing at rest on room air  Heart S1S2 no rubs or gallops appreciated Abdomen soft nontender nondistended Extremities no edema  Psych normal mood and affect Neuro - alert and oriented x 3 provides hx and follows commands Access: right UE AVF with bruit and thrill   Claudia Desanctis, MD 09/04/2020,8:58 AM

## 2020-09-04 NOTE — TOC Transition Note (Addendum)
Transition of Care [facility]) - CM/SW Discharge Note   Patient Details  Name: [name] MRN: [id number] Date of Birth: [birth year]  Transition of Care [facility]) CM/SW Contact:  Salome Arnt, [place] Phone Number: 09/04/2020, 2:16 PM   Clinical Narrative:   Pt d/c today and has financial clearance for dialysis at [facility] on TTS schedule at 11:15, starting tomorrow. LCSW spoke with Winfred Leeds at [facility] admissions and [place] at [facility]. Pt notified and reports he has transportation.  No other needs reported. MD and dialysis RN updated.     Final next level of care: Home/Self Care Barriers to Discharge: Barriers Resolved   Patient Goals and CMS Choice        Discharge Placement                       Discharge Plan and Services                                     Social Determinants of Health (SDOH) Interventions     Readmission Risk Interventions Readmission Risk Prevention Plan 08/09/2020  Transportation Screening Complete  Medication Review ([place]) Complete  HRI or [place] Complete  SW Recovery Care/Counseling Consult Complete  Palliative Care Screening Not [place] Not Applicable  Some recent data might be hidden

## 2020-09-04 NOTE — TOC Progression Note (Signed)
Transition of Care [facility]) - Progression Note    Patient Details  Name: [name] MRN: [id number] Date of Birth: [birth year]  Transition of Care [facility]) CM/SW Contact  Salome Arnt, [place] Phone Number: 09/04/2020, 8:14 AM  Clinical Narrative:  LCSW spoke with Anderson Malta at [facility] regarding financial clearance. She reports she is calling [facility] now to get additional documentation and is hopeful that everything will be completed today. Pt updated.  LCSW completed CHF screening with pt. He reports he weighs himself every morning and feels he does well following a heart healthy diet. Pt states he takes medications as prescribed and is followed by cardiologist at the [facility]. Will order Living Well with CHF book for pt.      Expected Discharge Plan: Home/Self Care Barriers to Discharge: Continued Medical Work up  Expected Discharge Plan and Services Expected Discharge Plan: Home/Self Care         Expected Discharge Date: 09/03/20                                     Social Determinants of Health (SDOH) Interventions    Readmission Risk Interventions Readmission Risk Prevention Plan 08/09/2020  Transportation Screening Complete  Medication Review [employer]) Complete  HRI or [place] Complete  SW Recovery Care/Counseling Consult Complete  Palliative Care Screening Not [place] Not Applicable  Some recent data might be hidden

## 2020-09-05 LAB — TYPE AND SCREEN
ABO/RH(D): A POS
Antibody Screen: NEGATIVE
Unit division: 0

## 2020-09-05 LAB — BPAM RBC
Blood Product Expiration Date: 202204292359
ISSUE DATE / TIME: 202204221458
Unit Type and Rh: 6200

## 2020-09-07 DIAGNOSIS — C678 Malignant neoplasm of overlapping sites of bladder: Secondary | ICD-10-CM | POA: Insufficient documentation

## 2020-10-07 HISTORY — PX: OTHER SURGICAL HISTORY: SHX169

## 2021-02-02 DIAGNOSIS — H8192 Unspecified disorder of vestibular function, left ear: Secondary | ICD-10-CM | POA: Diagnosis not present

## 2021-03-26 ENCOUNTER — Other Ambulatory Visit: Payer: Self-pay | Admitting: *Deleted

## 2021-03-26 DIAGNOSIS — I6529 Occlusion and stenosis of unspecified carotid artery: Secondary | ICD-10-CM

## 2021-04-05 ENCOUNTER — Encounter (HOSPITAL_COMMUNITY): Payer: Self-pay

## 2021-04-05 ENCOUNTER — Emergency Department (HOSPITAL_COMMUNITY): Payer: No Typology Code available for payment source

## 2021-04-05 ENCOUNTER — Inpatient Hospital Stay (HOSPITAL_COMMUNITY)
Admission: EM | Admit: 2021-04-05 | Discharge: 2021-04-09 | DRG: 444 | Disposition: A | Discharge status: Home / Self Care | Payer: No Typology Code available for payment source | Attending: Internal Medicine | Admitting: Internal Medicine

## 2021-04-05 ENCOUNTER — Other Ambulatory Visit: Payer: Self-pay

## 2021-04-05 DIAGNOSIS — I160 Hypertensive urgency: Secondary | ICD-10-CM | POA: Diagnosis present

## 2021-04-05 DIAGNOSIS — Z8709 Personal history of other diseases of the respiratory system: Secondary | ICD-10-CM | POA: Diagnosis not present

## 2021-04-05 DIAGNOSIS — Z79899 Other long term (current) drug therapy: Secondary | ICD-10-CM

## 2021-04-05 DIAGNOSIS — K8020 Calculus of gallbladder without cholecystitis without obstruction: Secondary | ICD-10-CM | POA: Diagnosis not present

## 2021-04-05 DIAGNOSIS — E039 Hypothyroidism, unspecified: Secondary | ICD-10-CM | POA: Diagnosis present

## 2021-04-05 DIAGNOSIS — I132 Hypertensive heart and chronic kidney disease with heart failure and with stage 5 chronic kidney disease, or end stage renal disease: Secondary | ICD-10-CM | POA: Diagnosis present

## 2021-04-05 DIAGNOSIS — I252 Old myocardial infarction: Secondary | ICD-10-CM | POA: Diagnosis not present

## 2021-04-05 DIAGNOSIS — N400 Enlarged prostate without lower urinary tract symptoms: Secondary | ICD-10-CM | POA: Diagnosis present

## 2021-04-05 DIAGNOSIS — Z992 Dependence on renal dialysis: Secondary | ICD-10-CM | POA: Diagnosis not present

## 2021-04-05 DIAGNOSIS — Z79890 Hormone replacement therapy (postmenopausal): Secondary | ICD-10-CM

## 2021-04-05 DIAGNOSIS — R7989 Other specified abnormal findings of blood chemistry: Secondary | ICD-10-CM

## 2021-04-05 DIAGNOSIS — N2581 Secondary hyperparathyroidism of renal origin: Secondary | ICD-10-CM | POA: Diagnosis present

## 2021-04-05 DIAGNOSIS — Z20822 Contact with and (suspected) exposure to covid-19: Secondary | ICD-10-CM | POA: Diagnosis present

## 2021-04-05 DIAGNOSIS — Z8249 Family history of ischemic heart disease and other diseases of the circulatory system: Secondary | ICD-10-CM

## 2021-04-05 DIAGNOSIS — I953 Hypotension of hemodialysis: Secondary | ICD-10-CM | POA: Diagnosis not present

## 2021-04-05 DIAGNOSIS — R748 Abnormal levels of other serum enzymes: Secondary | ICD-10-CM | POA: Diagnosis not present

## 2021-04-05 DIAGNOSIS — Z87891 Personal history of nicotine dependence: Secondary | ICD-10-CM

## 2021-04-05 DIAGNOSIS — J449 Chronic obstructive pulmonary disease, unspecified: Secondary | ICD-10-CM | POA: Diagnosis present

## 2021-04-05 DIAGNOSIS — Z888 Allergy status to other drugs, medicaments and biological substances status: Secondary | ICD-10-CM

## 2021-04-05 DIAGNOSIS — D631 Anemia in chronic kidney disease: Secondary | ICD-10-CM | POA: Diagnosis present

## 2021-04-05 DIAGNOSIS — K8050 Calculus of bile duct without cholangitis or cholecystitis without obstruction: Secondary | ICD-10-CM | POA: Diagnosis not present

## 2021-04-05 DIAGNOSIS — N186 End stage renal disease: Secondary | ICD-10-CM | POA: Diagnosis present

## 2021-04-05 DIAGNOSIS — K219 Gastro-esophageal reflux disease without esophagitis: Secondary | ICD-10-CM | POA: Diagnosis present

## 2021-04-05 DIAGNOSIS — M898X9 Other specified disorders of bone, unspecified site: Secondary | ICD-10-CM | POA: Diagnosis present

## 2021-04-05 DIAGNOSIS — R778 Other specified abnormalities of plasma proteins: Secondary | ICD-10-CM

## 2021-04-05 DIAGNOSIS — R1013 Epigastric pain: Secondary | ICD-10-CM | POA: Diagnosis present

## 2021-04-05 DIAGNOSIS — N289 Disorder of kidney and ureter, unspecified: Secondary | ICD-10-CM

## 2021-04-05 DIAGNOSIS — E782 Mixed hyperlipidemia: Secondary | ICD-10-CM | POA: Diagnosis present

## 2021-04-05 DIAGNOSIS — I10 Essential (primary) hypertension: Secondary | ICD-10-CM | POA: Diagnosis present

## 2021-04-05 DIAGNOSIS — K8000 Calculus of gallbladder with acute cholecystitis without obstruction: Secondary | ICD-10-CM | POA: Diagnosis present

## 2021-04-05 DIAGNOSIS — I214 Non-ST elevation (NSTEMI) myocardial infarction: Secondary | ICD-10-CM | POA: Diagnosis present

## 2021-04-05 DIAGNOSIS — I5032 Chronic diastolic (congestive) heart failure: Secondary | ICD-10-CM | POA: Diagnosis not present

## 2021-04-05 DIAGNOSIS — I272 Pulmonary hypertension, unspecified: Secondary | ICD-10-CM | POA: Diagnosis present

## 2021-04-05 DIAGNOSIS — I2510 Atherosclerotic heart disease of native coronary artery without angina pectoris: Secondary | ICD-10-CM | POA: Diagnosis present

## 2021-04-05 DIAGNOSIS — Z7902 Long term (current) use of antithrombotics/antiplatelets: Secondary | ICD-10-CM

## 2021-04-05 DIAGNOSIS — Z8711 Personal history of peptic ulcer disease: Secondary | ICD-10-CM

## 2021-04-05 DIAGNOSIS — D3501 Benign neoplasm of right adrenal gland: Secondary | ICD-10-CM | POA: Diagnosis not present

## 2021-04-05 DIAGNOSIS — Z8551 Personal history of malignant neoplasm of bladder: Secondary | ICD-10-CM

## 2021-04-05 DIAGNOSIS — K8070 Calculus of gallbladder and bile duct without cholecystitis without obstruction: Secondary | ICD-10-CM | POA: Diagnosis not present

## 2021-04-05 DIAGNOSIS — R079 Chest pain, unspecified: Secondary | ICD-10-CM | POA: Diagnosis not present

## 2021-04-05 DIAGNOSIS — I5042 Chronic combined systolic (congestive) and diastolic (congestive) heart failure: Secondary | ICD-10-CM | POA: Diagnosis present

## 2021-04-05 DIAGNOSIS — I350 Nonrheumatic aortic (valve) stenosis: Secondary | ICD-10-CM | POA: Diagnosis not present

## 2021-04-05 DIAGNOSIS — I5030 Unspecified diastolic (congestive) heart failure: Secondary | ICD-10-CM | POA: Diagnosis present

## 2021-04-05 DIAGNOSIS — Z955 Presence of coronary angioplasty implant and graft: Secondary | ICD-10-CM

## 2021-04-05 DIAGNOSIS — R1011 Right upper quadrant pain: Secondary | ICD-10-CM

## 2021-04-05 DIAGNOSIS — G4733 Obstructive sleep apnea (adult) (pediatric): Secondary | ICD-10-CM | POA: Diagnosis present

## 2021-04-05 DIAGNOSIS — R1010 Upper abdominal pain, unspecified: Secondary | ICD-10-CM

## 2021-04-05 DIAGNOSIS — M1990 Unspecified osteoarthritis, unspecified site: Secondary | ICD-10-CM | POA: Diagnosis present

## 2021-04-05 DIAGNOSIS — Z8673 Personal history of transient ischemic attack (TIA), and cerebral infarction without residual deficits: Secondary | ICD-10-CM

## 2021-04-05 DIAGNOSIS — I25810 Atherosclerosis of coronary artery bypass graft(s) without angina pectoris: Secondary | ICD-10-CM | POA: Diagnosis present

## 2021-04-05 DIAGNOSIS — R109 Unspecified abdominal pain: Secondary | ICD-10-CM | POA: Diagnosis present

## 2021-04-05 DIAGNOSIS — R011 Cardiac murmur, unspecified: Secondary | ICD-10-CM | POA: Diagnosis present

## 2021-04-05 HISTORY — DX: Dependence on renal dialysis: Z99.2

## 2021-04-05 HISTORY — DX: Disorder of kidney and ureter, unspecified: N28.9

## 2021-04-05 LAB — CBC
HCT: 37.4 % — ABNORMAL LOW (ref 39.0–52.0)
Hemoglobin: 12.1 g/dL — ABNORMAL LOW (ref 13.0–17.0)
MCH: 32.1 pg (ref 26.0–34.0)
MCHC: 32.4 g/dL (ref 30.0–36.0)
MCV: 99.2 fL (ref 80.0–100.0)
Platelets: 179 10*3/uL (ref 150–400)
RBC: 3.77 MIL/uL — ABNORMAL LOW (ref 4.22–5.81)
RDW: 16 % — ABNORMAL HIGH (ref 11.5–15.5)
WBC: 4.1 10*3/uL (ref 4.0–10.5)
nRBC: 0 % (ref 0.0–0.2)

## 2021-04-05 LAB — BASIC METABOLIC PANEL
Anion gap: 15 (ref 5–15)
BUN: 29 mg/dL — ABNORMAL HIGH (ref 8–23)
CO2: 30 mmol/L (ref 22–32)
Calcium: 8.8 mg/dL — ABNORMAL LOW (ref 8.9–10.3)
Chloride: 92 mmol/L — ABNORMAL LOW (ref 98–111)
Creatinine, Ser: 4.63 mg/dL — ABNORMAL HIGH (ref 0.61–1.24)
GFR, Estimated: 13 mL/min — ABNORMAL LOW (ref >60)
Glucose, Bld: 95 mg/dL (ref 70–99)
Potassium: 3.8 mmol/L (ref 3.5–5.1)
Sodium: 137 mmol/L (ref 135–145)

## 2021-04-05 LAB — TROPONIN I (HIGH SENSITIVITY)
Troponin I (High Sensitivity): 25 ng/L — ABNORMAL HIGH (ref <18)
Troponin I (High Sensitivity): 29 ng/L — ABNORMAL HIGH (ref <18)

## 2021-04-05 LAB — HEPATIC FUNCTION PANEL
ALT: 12 U/L (ref 0–44)
AST: 20 U/L (ref 15–41)
Albumin: 4 g/dL (ref 3.5–5.0)
Alkaline Phosphatase: 81 U/L (ref 38–126)
Bilirubin, Direct: 0.1 mg/dL (ref 0.0–0.2)
Indirect Bilirubin: 0.5 mg/dL (ref 0.3–0.9)
Total Bilirubin: 0.6 mg/dL (ref 0.3–1.2)
Total Protein: 7.8 g/dL (ref 6.5–8.1)

## 2021-04-05 LAB — LIPASE, BLOOD: Lipase: 56 U/L — ABNORMAL HIGH (ref 11–51)

## 2021-04-05 MED ORDER — PANTOPRAZOLE SODIUM 40 MG IV SOLR
40.0000 mg | Freq: Two times a day (BID) | INTRAVENOUS | Status: DC
  Administered 2021-04-05 – 2021-04-09 (×8): 40 mg via INTRAVENOUS
  Filled 2021-04-05 (×8): qty 40

## 2021-04-05 MED ORDER — ONDANSETRON HCL 4 MG/2ML IJ SOLN
4.0000 mg | Freq: Once | INTRAMUSCULAR | Status: AC
  Administered 2021-04-05: 4 mg via INTRAVENOUS
  Filled 2021-04-05: qty 2

## 2021-04-05 MED ORDER — HYDRALAZINE HCL 20 MG/ML IJ SOLN
10.0000 mg | Freq: Four times a day (QID) | INTRAMUSCULAR | Status: DC | PRN
  Administered 2021-04-06: 10 mg via INTRAVENOUS
  Filled 2021-04-05: qty 1

## 2021-04-05 MED ORDER — HYDROMORPHONE HCL 1 MG/ML IJ SOLN
0.5000 mg | Freq: Once | INTRAMUSCULAR | Status: AC
  Administered 2021-04-05: 0.5 mg via INTRAVENOUS
  Filled 2021-04-05: qty 1

## 2021-04-05 MED ORDER — HYDRALAZINE HCL 20 MG/ML IJ SOLN
5.0000 mg | Freq: Once | INTRAMUSCULAR | Status: AC
  Administered 2021-04-05: 5 mg via INTRAVENOUS
  Filled 2021-04-05: qty 1

## 2021-04-05 MED ORDER — LIDOCAINE VISCOUS HCL 2 % MT SOLN
15.0000 mL | Freq: Once | OROMUCOSAL | Status: DC
  Filled 2021-04-05: qty 15

## 2021-04-05 MED ORDER — ENOXAPARIN SODIUM 30 MG/0.3ML IJ SOSY: 30.0000 mg | PREFILLED_SYRINGE | INTRAMUSCULAR | Status: DC

## 2021-04-05 MED ORDER — ONDANSETRON 4 MG PO TBDP
4.0000 mg | ORAL_TABLET | Freq: Once | ORAL | Status: AC
  Administered 2021-04-05: 4 mg via ORAL
  Filled 2021-04-05: qty 1

## 2021-04-05 MED ORDER — PROCHLORPERAZINE EDISYLATE 10 MG/2ML IJ SOLN
5.0000 mg | Freq: Four times a day (QID) | INTRAMUSCULAR | Status: DC | PRN
  Administered 2021-04-05: 5 mg via INTRAVENOUS
  Filled 2021-04-05: qty 2

## 2021-04-05 MED ORDER — ONDANSETRON HCL 4 MG/2ML IJ SOLN
4.0000 mg | Freq: Four times a day (QID) | INTRAMUSCULAR | Status: DC | PRN
  Administered 2021-04-05: 4 mg via INTRAVENOUS
  Filled 2021-04-05: qty 2

## 2021-04-05 MED ORDER — HYDROMORPHONE HCL 1 MG/ML IJ SOLN
0.5000 mg | INTRAMUSCULAR | Status: DC | PRN
  Administered 2021-04-05 – 2021-04-07 (×4): 0.5 mg via INTRAVENOUS
  Filled 2021-04-05 (×3): qty 0.5
  Filled 2021-04-05: qty 1

## 2021-04-05 MED ORDER — ALUM & MAG HYDROXIDE-SIMETH 200-200-20 MG/5ML PO SUSP
30.0000 mL | Freq: Once | ORAL | Status: DC
  Filled 2021-04-05: qty 30

## 2021-04-05 NOTE — H&P (Addendum)
History and Physical  [name] ZCH:[id number] DOB: [birth year] DOA: 04/05/2021  Referring physician: Milton Ferguson, MD PCP: [facility], [facility]  Patient coming from: Home  Chief Complaint: Abdominal pain  HPI: [name] is a 75 y.o. male with medical history significant for ESRD on HD (MWF), bladder cancer diagnosed in 2013, patient follows with Dr.[facility] with last visit on 11/14 for cystoscopy which showed BPH and no visible tumors in the bladder, hyperlipidemia, hypertension, BPH, hypothyroidism, GERD, CAD s/p CABG, chronic diastolic CHF who presents to the emergency department due to epigastric pain which started this afternoon.  Pain was described as burning sensation, it was persistent and was rated as 6/10 on pain scale, pain radiates slightly to the right upper abdominal area and was associated with nausea and several episodes of nonbloody vomiting.  Abdominal pain temporarily improves after vomiting.  He denies fever, chills, chest pain, shortness of breath, headache.  ED Course:  In the emergency department, BP was elevated at 204/52, but other vital signs were within normal range.  Work-up in the ED showed normocytic anemia, BMP showed BUN/creatinine 29/4.63, troponin x2 -25 > 29.  Lipase 56. CT abdomen and pelvis without contrast showed cholelithiasis with question pericholecystic fluid and slight haziness of the gallbladder wall. Chest x-ray showed no acute intrathoracic process Patient was treated with Dilaudid and Zofran.  Cardiologist was consulted due to new EKG changes and patient will be seen in the morning if admitted per ED medical record.  General surgery (Dr. Arnoldo Morale) was also consulted and will see patient in the morning per ED physician.  Hospitalist was asked to admit patient for further evaluation and management.  Review of Systems: Constitutional: Negative for chills and fever.  HENT: Negative for ear pain and sore throat.   Eyes:  Negative for pain and visual disturbance.  Respiratory: Negative for cough, chest tightness and shortness of breath.   Cardiovascular: Negative for chest pain and palpitations.  Gastrointestinal: Positive for abdominal pain, nausea and vomiting.  Endocrine: Negative for polyphagia and polyuria.  Genitourinary: Negative for decreased urine volume, dysuria, enuresis Musculoskeletal: Negative for arthralgias and back pain.  Skin: Negative for color change and rash.  Allergic/Immunologic: Negative for immunocompromised state.  Neurological: Negative for tremors, syncope, speech difficulty, light-headedness and headaches.  Hematological: Does not bruise/bleed easily.  All other systems reviewed and are negative   Past Medical History:  Diagnosis Date   Acute myocardial infarction of other inferior wall, initial episode of care    Anemia    Anginal pain ([place])    Anxiety    Arthritis    Cancer ([place])    bladder 2013 removed, has cystoscopy 10/2016, has returned x 2   COPD (chronic obstructive pulmonary disease) ([place])    Coronary artery disease    Artery bypass graft Jan 2008   Dialysis patient [facility])    Dyspnea    Dysrhythmia    GERD (gastroesophageal reflux disease)    Heart murmur    History of hiatal hernia    Hyperlipidemia    Hypertension    Hypothyroidism    PONV (postoperative nausea and vomiting)    Renal disorder    Sleep apnea    Stroke [facility])    Tobacco user    Past Surgical History:  Procedure Laterality Date   APPENDECTOMY     AV FISTULA PLACEMENT Right 02/11/2020   Procedure: RIGHT ARM BRACHIOCEPHALIC ARTERIOVENOUS (AV) FISTULA;  Surgeon: Angelia Mould, MD;  Location: [place];  Service: Vascular;  Laterality: Right;   CARDIAC CATHETERIZATION     COLONOSCOPY WITH PROPOFOL N/A 09/02/2020   Procedure: COLONOSCOPY WITH PROPOFOL;  Surgeon: Rogene Houston, MD;  [facility];  Service: Endoscopy;  Laterality: N/A;   CORONARY ARTERY BYPASS GRAFT   06/14/2006   x 5   CORONARY ARTERY BYPASS GRAFT N/A 09/19/2016   Procedure: REDO CORONARY ARTERY BYPASS GRAFTING (CABG) x two, using right internal mammary artery and left radial artery;  Surgeon: [name], MD;  Location: [place];  Service: Open Heart Surgery;  Laterality: N/A;   FOOT SURGERY     HEMOSTASIS CLIP PLACEMENT  09/02/2020   Procedure: HEMOSTASIS CLIP PLACEMENT;  Surgeon: Rogene Houston, MD;  [facility];  Service: Endoscopy;;   HERNIA REPAIR     HOT HEMOSTASIS  09/02/2020   Procedure: HOT HEMOSTASIS (ARGON PLASMA COAGULATION/BICAP);  Surgeon: Rogene Houston, MD;  [facility];  Service: Endoscopy;;   LEFT HEART CATH AND CORS/GRAFTS ANGIOGRAPHY N/A 09/14/2016   Procedure: Left Heart Cath and Cors/Grafts Angiography;  Surgeon: Troy Sine, MD;  Location: [place] [facility];  Service: Cardiovascular;  Laterality: N/A;   LEFT HEART CATHETERIZATION WITH CORONARY/GRAFT ANGIOGRAM N/A 01/31/2014   Procedure: LEFT HEART CATHETERIZATION WITH Beatrix Fetters;  Surgeon: Sanda Klein, MD;  Location: [place] CATH LAB;  Service: Cardiovascular;  Laterality: N/A;   NOSE SURGERY     PERCUTANEOUS CORONARY STENT INTERVENTION (PCI-S)  01/31/2014   Procedure: PERCUTANEOUS CORONARY STENT INTERVENTION (PCI-S);  Surgeon: Sanda Klein, MD;  Location: [facility] CATH LAB;  Service: Cardiovascular;;   POLYPECTOMY  09/02/2020   Procedure: POLYPECTOMY;  Surgeon: Rogene Houston, MD;  [facility];  Service: Endoscopy;;   RADIAL ARTERY HARVEST Left 09/19/2016   Procedure: RADIAL ARTERY HARVEST;  Surgeon: [name], MD;  Location: [place];  Service: Open Heart Surgery;  Laterality: Left;   TEE WITHOUT CARDIOVERSION N/A 09/19/2016   Procedure: TRANSESOPHAGEAL ECHOCARDIOGRAM (TEE);  Surgeon: [name], MD;  Location: [place];  Service: Open Heart Surgery;  Laterality: N/A;    Social History:  reports that he quit smoking about 4 years ago. His smoking use included  cigarettes. He started smoking about 62 years ago. He has a 40.00 pack-year smoking history. He has never used smokeless tobacco. He reports that he does not drink alcohol and does not use drugs.   Allergies  Allergen Reactions   Cardizem Cd [Diltiazem Hcl Er Beads] Palpitations    "Makes heart skip"   Cardizem [Diltiazem]     Family History  Problem Relation Age of Onset   Heart attack Mother    Heart attack Brother    Colon cancer Neg Hx    Colon polyps Neg Hx      Prior to Admission medications   Medication Sig Start Date End Date Taking? Authorizing Provider  acetaminophen (TYLENOL) 325 MG tablet Take 2 tablets (650 mg total) by mouth every 6 (six) hours as needed for mild pain. 09/23/16   Nani Skillern, PA-C  allopurinol (ZYLOPRIM) 100 MG tablet Take 100 mg by mouth daily.  05/03/19   [provider]  atorvastatin (LIPITOR) 80 MG tablet Take 80 mg by mouth daily.    [provider]  carvedilol (COREG) 3.125 MG tablet Take 1 tablet (3.125 mg total) by mouth 2 (two) times daily. 05/10/20 05/10/21  Debbe Odea, MD  clopidogrel (PLAVIX) 75 MG tablet Take 75 mg by mouth daily.  [provider]  epoetin alfa (EPOGEN) 20000 UNIT/ML injection Inject 20,000 Units into the skin every Monday. Patient not taking: No sig reported    [provider]  finasteride (PROSCAR) 5 MG tablet Take 5 mg by mouth daily.    [provider]  folic acid (FOLVITE) 1 MG tablet Take 1 mg by mouth daily.    [provider]  furosemide (LASIX) 80 MG tablet Take 1 tablet (80 mg total) by mouth every Tuesday, Thursday, Saturday, and Sunday. 09/05/20   Orson Eva, MD  hydrALAZINE (APRESOLINE) 100 MG tablet Take 1 tablet (100 mg total) by mouth 3 (three) times daily. 05/18/19   Roxan Hockey, MD  isosorbide mononitrate (IMDUR) 120 MG 24 hr tablet Take 1 tablet (120 mg total) by mouth daily. Patient taking differently: Take 240 mg by mouth daily. 05/18/19    Roxan Hockey, MD  levothyroxine (SYNTHROID, LEVOTHROID) 50 MCG tablet Take 50 mcg by mouth daily before breakfast.    [provider]  magnesium oxide (MAG-OX) 400 MG tablet Take 1 tablet (400 mg total) by mouth daily. 08/11/20   Roxan Hockey, MD  nitroGLYCERIN (NITROSTAT) 0.4 MG SL tablet Place 0.4 mg under the tongue every 5 (five) minutes as needed for chest pain (max 3 doses).    [provider]  Omega-3 Fatty Acids (RA FISH OIL) 1000 MG CAPS Take 2,000 mg by mouth 2 (two) times daily.    [provider]  omeprazole (PRILOSEC) 20 MG capsule Take 20 mg by mouth 2 (two) times daily. 02/14/20   [provider]  tamsulosin (FLOMAX) 0.4 MG CAPS capsule Take 0.4 mg by mouth daily.    [provider]  Vitamin D, Cholecalciferol, 25 MCG (1000 UT) CAPS Take 1,000 Units by mouth daily.    [provider]    Physical Exam: BP (!) 170/94 (BP Location: Left Arm)   Pulse 74   Temp (!) 97.5 F (36.4 C) (Oral)   Resp 18   Ht 5\' 9"  (1.753 m)   Wt 83 kg   SpO2 99%   BMI 27.02 kg/m   General: 75 y.o. year-old male well developed well nourished in no acute distress.  Alert and oriented x3. HEENT: NCAT, EOMI Neck: Supple, trachea medial Cardiovascular: Regular rate and rhythm with no rubs or gallops.  No thyromegaly or JVD noted.  No lower extremity edema. 2/4 pulses in all 4 extremities. Respiratory: Clear to auscultation with no wheezes or rales. Good inspiratory effort. Abdomen: Soft, tender to palpation of epigastric and RUQ without guarding.  Nondistended with normal bowel sounds x4 quadrants. Muskuloskeletal: No cyanosis, clubbing or edema noted bilaterally Lymphatic: No axillary/supraclavicular adenopathy Neuro: CN II-XII intact, strength 5/5 x 4, sensation, reflexes intact Skin: No ulcerative lesions noted or rashes Psychiatry: Judgement and insight appear normal. Mood is appropriate for condition and setting          Labs on  Admission:  Basic Metabolic Panel: Recent Labs  Lab 04/05/21 1446 04/06/21 0023  NA 137 134*  K 3.8 3.8  CL 92* 92*  CO2 30 26  GLUCOSE 95 126*  BUN 29* 37*  CREATININE 4.63* 5.18*  CALCIUM 8.8* 9.1  MG  --  1.7  PHOS  --  6.4*   Liver Function Tests: Recent Labs  Lab 04/05/21 1446 04/06/21 0023  AST 20 24  ALT 12 13  ALKPHOS 81 95  BILITOT 0.6 0.8  PROT 7.8 8.7*  ALBUMIN 4.0 4.4   Recent Labs  Lab 04/05/21 1446  LIPASE 56*   No results for input(s): AMMONIA in the last 168 hours. CBC: Recent Labs  Lab 04/05/21 1446 04/06/21 0023  WBC 4.1 6.5  HGB 12.1* 13.7  HCT 37.4* 42.6  MCV 99.2 98.2  PLT 179 162   Cardiac Enzymes: No results for input(s): CKTOTAL, CKMB, CKMBINDEX, TROPONINI in the last 168 hours.  BNP (last 3 results) Recent Labs    08/08/20 0810 08/10/20 0516 08/30/20 0449  BNP 2,081.0* 2,146.0* 2,912.0*    ProBNP (last 3 results) No results for input(s): PROBNP in the last 8760 hours.  CBG: No results for input(s): GLUCAP in the last 168 hours.  Radiological Exams on Admission: CT ABDOMEN PELVIS WO CONTRAST  Result Date: 04/05/2021 CLINICAL DATA:  Possible abscess/ infection Nausea/ vomitting EXAM: CT ABDOMEN AND PELVIS WITHOUT CONTRAST TECHNIQUE: Multidetector CT imaging of the abdomen and pelvis was performed following the standard protocol without IV contrast. COMPARISON:  None. FINDINGS: Lower chest: No acute abnormality. Aortic valve leaflet calcifications. Coronary artery calcifications. Sternotomy wires noted. Hepatobiliary: No focal liver abnormality. Calcified gallstone noted within the gallbladder lumen. Question pericholecystic fluid. Slight haziness of the gallbladder wall. No definite gallbladder wall thickening no biliary dilatation. Pancreas: No focal lesion. Normal pancreatic contour. No surrounding inflammatory changes. No main pancreatic ductal dilatation. Spleen: Normal in size without focal abnormality. Adrenals/Urinary  Tract: There is a 2.4 cm right adrenal gland nodule with a density of -10 Hounsfield units consistent with nodule adenoma. Bilateral renal atrophy. Multiple fluid density lesions likely represent simple renal cysts with the largest on the right measuring up to 8.3 cm. Subcentimeter densities are too small to characterize. There is a 1.3 cm lesion with a density of 26 Hounsfield units that is indeterminate (2:39) in the left kidney. Calcifications associated with kidneys appear to be vascular. No nephrolithiasis and no hydronephrosis. No definite contour-deforming renal mass. No ureterolithiasis or hydroureter. The urinary bladder is unremarkable. Stomach/Bowel: Stomach is within normal limits. No evidence of bowel wall thickening or dilatation. Scattered colonic diverticulosis. The appendix not definitely identified. Vascular/Lymphatic: No abdominal aorta or iliac aneurysm. Severe atherosclerotic plaque of the aorta and its branches. No abdominal, pelvic, or inguinal lymphadenopathy. Reproductive: Prostate is unremarkable. Other: No intraperitoneal free fluid. No intraperitoneal free gas. No organized fluid collection. Musculoskeletal: Status post right repair with. Likely left repair. No definite inguinal hernia repair. No suspicious lytic or blastic osseous lesions. No acute displaced fracture. Multilevel degenerative changes of the spine. IMPRESSION: 1. Cholelithiasis with question pericholecystic fluid and slight haziness of the gallbladder wall. Recommend right upper quadrant ultrasound for a more sensitive evaluation of the gallbladder. 2. Indeterminate 1.3 cm left renal lesion. Recommend nonemergent MRI renal protocol for further evaluation. When the patient is clinically stable and able to follow directions and hold their breath (preferably as an outpatient) further evaluation with dedicated abdominal MRI should be considered. 3. Colonic diverticulosis with no acute diverticulitis. 4. A 2.4 cm right adrenal  gland adenoma. 5.  Aortic Atherosclerosis (ICD10-I70.0)-severe. Electronically Signed   By: Iven Finn M.D.   On: 04/05/2021 19:18   DG Chest 2 View  Result Date: 04/05/2021 CLINICAL DATA:  Chest pain for 45 minutes, central chest pain and epigastric pain EXAM: CHEST - 2 VIEW COMPARISON:  08/30/2020 FINDINGS: Frontal and lateral views of the chest demonstrate postsurgical changes from prior median sternotomy. The cardiac silhouette is unremarkable. No acute airspace disease, effusion, or pneumothorax. No acute bony abnormalities. IMPRESSION: 1. No acute intrathoracic process. Electronically  Signed   By: Randa Ngo M.D.   On: 04/05/2021 15:06    EKG: I independently viewed the EKG done and my findings are as followed: Sinus bradycardia at a rate of 57 bpm and ST and T wave abnormality  Assessment/Plan Present on Admission:  Abdominal pain  Essential hypertension  HYPERLIPIDEMIA, MIXED  (HFpEF) heart failure with preserved ejection fraction (HCC)  Coronary artery disease involving autologous artery coronary bypass graft  BPH (benign prostatic hyperplasia)  Principal Problem:   Abdominal pain Active Problems:   HYPERLIPIDEMIA, MIXED   Essential hypertension   Renal lesion   (HFpEF) heart failure with preserved ejection fraction (HCC)   BPH (benign prostatic hyperplasia)   Coronary artery disease involving autologous artery coronary bypass graft   Elevated troponin   End-stage renal disease on hemodialysis (HCC)   Cholelithiasis   Hypertensive urgency   Adenoma of right adrenal gland   Elevated lipase   Hypothyroidism (acquired)   GERD (gastroesophageal reflux disease)   History of COPD   Abdominal pain possibly secondary to cholelithiasis, GERD, gastroenteritis CT abdomen and pelvis showed choledocholithiasis, RUQ ultrasound will be done in the morning Continue IV Protonix 40 mg every 12 hours Continue Zofran and Compazine as needed continue IV Dilaudid 4 mg every 4  hours as needed General surgery was consulted by ED physician and will see patient in the morning General surgery  Elevated troponin possible secondary to type II demand ischemia Troponin x 2 - 25 > 29; patient denies chest pain EKG showed sinus bradycardia at a rate of 57 bpm and ST and T wave abnormality Cardiology was consulted and will see patient in the morning per ED physician  NOTE: Patient's troponin continued to increase-89 > 150.  He continued to complain of epigastric pain with no radiation.  This may still be due to type II demand ischemia. Repeated EKG showed normal sinus rhythm at a rate of 82 bpm with premature SVC's.  We shall continue to trend troponin, patient will be started on heparin drip.  Elevated lipase possibly due to above Lipase 56, continue management as per above  Hypertensive urgency Essential hypertension (uncontrolled) Continue IV hydralazine 10 mg every 6 hours as needed  Incidental finding of left renal lesion CT abdomen and pelvis showed indeterminate 1.3 cm left renal lesion Nonemergent MRI renal protocol was recommended for further evaluation  Right adrenal gland adenoma 2.4 similar right adrenal gland adenoma noted on CT abdomen pelvis  Mixed hyperlipidemia Continue Lipitor and omega-3 fatty acids  Acquired hypothyroidism Continue Synthroid  HFpEF Continue Lipitor, Coreg, Lasix  ESRD on HD (MWF) Nephrology will be consulted for maintenance dialysis  CAD s/p CABG Continue Plavix, Lipitor, Coreg  BPH Continue Proscar and Flomax  DVT prophylaxis: Lovenox  Code Status: Full code  Family Communication: None at bedside  Disposition Plan:  Patient is from:                        home Anticipated DC to:                   SNF or family members home Anticipated DC date:               2-3 days Anticipated DC barriers:          Patient requires inpatient management due to abdominal pain possibly due to cholelithiasis pending surgical  consult   Consults called: General surgery, cardiology  Admission status: Inpatient  Bernadette Hoit MD [facility]  04/06/2021, 1:09 AM

## 2021-04-05 NOTE — ED Triage Notes (Signed)
Pt reports chest pain that started approx 45 min ago.  Reports was on his way to go shopping when the pain started.  Reports pain is in center of chest and epigastric region. Describes as a dull/aching pain.  Reports had dialysis this morning.

## 2021-04-05 NOTE — ED Provider Notes (Signed)
[facility] EMERGENCY DEPARTMENT Provider Note   CSN: [id number] Arrival date & time: 04/05/21  1413     History Chief Complaint  Patient presents with   Chest Pain    [name] is a 75 y.o. male.  Patient states he had dialysis today and he has history of coronary artery disease.  He started with epigastric discomfort and took a nitroglycerin and then threw up and felt some better.  Now the pain is returned  The history is provided by the patient and medical records. No language interpreter was used.  Chest Pain Pain location:  Epigastric Pain quality: aching   Pain radiates to:  Does not radiate Pain severity:  Moderate Onset quality:  Sudden Timing:  Constant Progression:  Worsening Chronicity:  New Context: not breathing   Relieved by:  Nothing Associated symptoms: abdominal pain   Associated symptoms: no back pain, no cough, no fatigue and no headache       Past Medical History:  Diagnosis Date   Acute myocardial infarction of other inferior wall, initial episode of care    Anemia    Anginal pain ([place])    Anxiety    Arthritis    Cancer ([place])    bladder 2013 removed, has cystoscopy 10/2016, has returned x 2   COPD (chronic obstructive pulmonary disease) ([place])    Coronary artery disease    Artery bypass graft Jan 2008   Dialysis patient [facility])    Dyspnea    Dysrhythmia    GERD (gastroesophageal reflux disease)    Heart murmur    History of hiatal hernia    Hyperlipidemia    Hypertension    Hypothyroidism    PONV (postoperative nausea and vomiting)    Renal disorder    Sleep apnea    Stroke [facility])    Tobacco user     Patient Active Problem List   Diagnosis Date Noted   ESRD (end stage renal disease) ([place]) 09/02/2020   Rectal bleeding    Acute diastolic CHF (congestive heart failure) ([place]) 05/09/2020   Acute exacerbation of Diastolic CHF (congestive heart failure) ([place]) 05/08/2020   Symptomatic bradycardia with heart failure 05/08/2020   COPD  without exacerbation (HCC)---40 Pack Years 05/08/2020   Acute respiratory failure with hypoxia ([place]) 05/08/2020   Uremia in the setting of AKI on CKD V 02/07/2020   CKD (chronic kidney disease), stage V ([place]) 02/07/2020   Coronary artery disease involving native coronary artery of native heart without angina pectoris 12/16/2019   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   Gastroenteritis 12/16/2019   History of bladder cancer 12/16/2019   Hyperkalemia 12/16/2019   (HFpEF) heart failure with preserved ejection fraction ([place]) 05/17/2019   AKI (acute kidney injury) on CKD V 05/16/2019   Proteinuria 03/28/2019   Acute blood loss anemia 06/08/2018   Anemia in chronic kidney disease 06/08/2018   BPH (benign prostatic hyperplasia) 06/08/2018   Coronary artery disease with unstable angina pectoris ([place]) 06/08/2018   Elevated troponin I level 06/08/2018   GIB (gastrointestinal bleeding) 06/07/2018   Coronary artery disease involving autologous artery coronary bypass graft 06/29/2017   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/19/2016   NSTEMI (non-ST elevated myocardial infarction) ([place]) 09/14/2016   Chest pain 09/14/2016   Obstructive sleep apnea 09/14/2016   Acute myocardial infarction of other inferior wall, initial episode of care    Unstable angina ([place]) 01/30/2014   OVERWEIGHT/OBESITY 02/02/2010   HYPERLIPIDEMIA, MIXED 01/22/2009   OTHER CHEST PAIN 01/22/2009   TOBACCO  USER 01/17/2009   Essential hypertension 01/17/2009    Past Surgical History:  Procedure Laterality Date   APPENDECTOMY     AV FISTULA PLACEMENT Right 02/11/2020   Procedure: RIGHT ARM BRACHIOCEPHALIC ARTERIOVENOUS (AV) FISTULA;  Surgeon: Angelia Mould, MD;  Location: [facility] OR;  Service: Vascular;  Laterality: Right;   CARDIAC CATHETERIZATION     COLONOSCOPY WITH PROPOFOL N/A 09/02/2020   Procedure: COLONOSCOPY WITH PROPOFOL;  Surgeon: Rogene Houston, MD;  [facility];  Service: Endoscopy;  Laterality: N/A;    CORONARY ARTERY BYPASS GRAFT  06/14/2006   x 5   CORONARY ARTERY BYPASS GRAFT N/A 09/19/2016   Procedure: REDO CORONARY ARTERY BYPASS GRAFTING (CABG) x two, using right internal mammary artery and left radial artery;  Surgeon: [name], MD;  Location: [place];  Service: Open Heart Surgery;  Laterality: N/A;   FOOT SURGERY     HEMOSTASIS CLIP PLACEMENT  09/02/2020   Procedure: HEMOSTASIS CLIP PLACEMENT;  Surgeon: Rogene Houston, MD;  [facility];  Service: Endoscopy;;   HERNIA REPAIR     HOT HEMOSTASIS  09/02/2020   Procedure: HOT HEMOSTASIS (ARGON PLASMA COAGULATION/BICAP);  Surgeon: Rogene Houston, MD;  [facility];  Service: Endoscopy;;   LEFT HEART CATH AND CORS/GRAFTS ANGIOGRAPHY N/A 09/14/2016   Procedure: Left Heart Cath and Cors/Grafts Angiography;  Surgeon: Troy Sine, MD;  Location: [place] CV LAB;  Service: Cardiovascular;  Laterality: N/A;   LEFT HEART CATHETERIZATION WITH CORONARY/GRAFT ANGIOGRAM N/A 01/31/2014   Procedure: LEFT HEART CATHETERIZATION WITH Beatrix Fetters;  Surgeon: Sanda Klein, MD;  Location: [place] CATH LAB;  Service: Cardiovascular;  Laterality: N/A;   NOSE SURGERY     PERCUTANEOUS CORONARY STENT INTERVENTION (PCI-S)  01/31/2014   Procedure: PERCUTANEOUS CORONARY STENT INTERVENTION (PCI-S);  Surgeon: Sanda Klein, MD;  Location: [facility] CATH LAB;  Service: Cardiovascular;;   POLYPECTOMY  09/02/2020   Procedure: POLYPECTOMY;  Surgeon: Rogene Houston, MD;  [facility];  Service: Endoscopy;;   RADIAL ARTERY HARVEST Left 09/19/2016   Procedure: RADIAL ARTERY HARVEST;  Surgeon: [name], MD;  Location: [place];  Service: Open Heart Surgery;  Laterality: Left;   TEE WITHOUT CARDIOVERSION N/A 09/19/2016   Procedure: TRANSESOPHAGEAL ECHOCARDIOGRAM (TEE);  Surgeon: [name], MD;  Location: [place];  Service: Open Heart Surgery;  Laterality: N/A;       Family History  Problem Relation Age of Onset   Heart attack  Mother    Heart attack Brother    Colon cancer Neg Hx    Colon polyps Neg Hx     Social History   Tobacco Use   Smoking status: Former    Packs/day: 1.00    Years: 40.00    Pack years: 40.00    Types: Cigarettes    Start date: 02/20/1959    Quit date: 09/13/2016    Years since quitting: 4.5   Smokeless tobacco: Never  Vaping Use   Vaping Use: Never used  Substance Use Topics   Alcohol use: No    Alcohol/week: 0.0 standard drinks    Comment: "No, not really"   Drug use: No    Home Medications Prior to Admission medications   Medication Sig Start Date End Date Taking? Authorizing Provider  acetaminophen (TYLENOL) 325 MG tablet Take 2 tablets (650 mg total) by mouth every 6 (six) hours as needed for mild pain. 09/23/16   Nani Skillern, PA-C  allopurinol (ZYLOPRIM) 100  MG tablet Take 100 mg by mouth daily.  05/03/19   [provider]  atorvastatin (LIPITOR) 80 MG tablet Take 80 mg by mouth daily.    [provider]  carvedilol (COREG) 3.125 MG tablet Take 1 tablet (3.125 mg total) by mouth 2 (two) times daily. 05/10/20 05/10/21  Debbe Odea, MD  clopidogrel (PLAVIX) 75 MG tablet Take 75 mg by mouth daily.    [provider]  epoetin alfa (EPOGEN) 20000 UNIT/ML injection Inject 20,000 Units into the skin every Monday. Patient not taking: No sig reported    [provider]  finasteride (PROSCAR) 5 MG tablet Take 5 mg by mouth daily.    [provider]  folic acid (FOLVITE) 1 MG tablet Take 1 mg by mouth daily.    [provider]  furosemide (LASIX) 80 MG tablet Take 1 tablet (80 mg total) by mouth every Tuesday, Thursday, Saturday, and Sunday. 09/05/20   Orson Eva, MD  hydrALAZINE (APRESOLINE) 100 MG tablet Take 1 tablet (100 mg total) by mouth 3 (three) times daily. 05/18/19   Roxan Hockey, MD  isosorbide mononitrate (IMDUR) 120 MG 24 hr tablet Take 1 tablet (120 mg total) by mouth daily. Patient taking differently:  Take 240 mg by mouth daily. 05/18/19   Roxan Hockey, MD  levothyroxine (SYNTHROID, LEVOTHROID) 50 MCG tablet Take 50 mcg by mouth daily before breakfast.    [provider]  magnesium oxide (MAG-OX) 400 MG tablet Take 1 tablet (400 mg total) by mouth daily. 08/11/20   Roxan Hockey, MD  nitroGLYCERIN (NITROSTAT) 0.4 MG SL tablet Place 0.4 mg under the tongue every 5 (five) minutes as needed for chest pain (max 3 doses).    [provider]  Omega-3 Fatty Acids (RA FISH OIL) 1000 MG CAPS Take 2,000 mg by mouth 2 (two) times daily.    [provider]  omeprazole (PRILOSEC) 20 MG capsule Take 20 mg by mouth 2 (two) times daily. 02/14/20   [provider]  tamsulosin (FLOMAX) 0.4 MG CAPS capsule Take 0.4 mg by mouth daily.    [provider]  Vitamin D, Cholecalciferol, 25 MCG (1000 UT) CAPS Take 1,000 Units by mouth daily.    [provider]    Allergies    Cardizem cd [diltiazem hcl er beads]  Review of Systems   Review of Systems  Constitutional:  Negative for appetite change and fatigue.  HENT:  Negative for congestion, ear discharge and sinus pressure.   Eyes:  Negative for discharge.  Respiratory:  Negative for cough.   Cardiovascular:  Positive for chest pain.  Gastrointestinal:  Positive for abdominal pain. Negative for diarrhea.  Genitourinary:  Negative for frequency and hematuria.  Musculoskeletal:  Negative for back pain.  Skin:  Negative for rash.  Neurological:  Negative for seizures and headaches.  Psychiatric/Behavioral:  Negative for hallucinations.    Physical Exam Updated Vital Signs BP (!) 184/46   Pulse (!) 59   Temp (!) 97.5 F (36.4 C) (Oral)   Resp 14   Ht 5\' 9"  (1.753 m)   Wt 83 kg   SpO2 96%   BMI 27.02 kg/m   Physical Exam Vitals and nursing note reviewed.  Constitutional:      Appearance: He is well-developed.  HENT:     Head: Normocephalic.     Nose: Nose normal.  Eyes:     General: No  scleral icterus.    Conjunctiva/sclera: Conjunctivae normal.  Neck:     Thyroid:  No thyromegaly.  Cardiovascular:     Rate and Rhythm: Normal rate and regular rhythm.     Heart sounds: No murmur heard.   No friction rub. No gallop.  Pulmonary:     Breath sounds: No stridor. No wheezing or rales.  Chest:     Chest wall: No tenderness.  Abdominal:     General: There is no distension.     Tenderness: There is abdominal tenderness. There is no rebound.  Musculoskeletal:        General: Normal range of motion.     Cervical back: Neck supple.  Lymphadenopathy:     Cervical: No cervical adenopathy.  Skin:    Findings: No erythema or rash.  Neurological:     Mental Status: He is alert and oriented to person, place, and time.     Motor: No abnormal muscle tone.     Coordination: Coordination normal.  Psychiatric:        Behavior: Behavior normal.    ED Results / Procedures / Treatments   Labs (all labs ordered are listed, but only abnormal results are displayed) Labs Reviewed  BASIC METABOLIC PANEL - Abnormal; Notable for the following components:      Result Value   Chloride 92 (*)    BUN 29 (*)    Creatinine, Ser 4.63 (*)    Calcium 8.8 (*)    GFR, Estimated 13 (*)    All other components within normal limits  CBC - Abnormal; Notable for the following components:   RBC 3.77 (*)    Hemoglobin 12.1 (*)    HCT 37.4 (*)    RDW 16.0 (*)    All other components within normal limits  LIPASE, BLOOD - Abnormal; Notable for the following components:   Lipase 56 (*)    All other components within normal limits  TROPONIN I (HIGH SENSITIVITY) - Abnormal; Notable for the following components:   Troponin I (High Sensitivity) 25 (*)    All other components within normal limits  TROPONIN I (HIGH SENSITIVITY) - Abnormal; Notable for the following components:   Troponin I (High Sensitivity) 29 (*)    All other components within normal limits  HEPATIC FUNCTION PANEL    EKG EKG  Interpretation  Date/Time:  Monday April 05 2021 14:31:23 EST Ventricular Rate:  57 PR Interval:  132 QRS Duration: 104 QT Interval:  446 QTC Calculation: 434 R Axis:   33 Text Interpretation: Sinus bradycardia ST & T wave abnormality, consider lateral ischemia Abnormal ECG Confirmed by Milton Ferguson [phone]) on 04/05/2021 3:29:52 PM  Radiology CT ABDOMEN PELVIS WO CONTRAST  Result Date: 04/05/2021 CLINICAL DATA:  Possible abscess/ infection Nausea/ vomitting EXAM: CT ABDOMEN AND PELVIS WITHOUT CONTRAST TECHNIQUE: Multidetector CT imaging of the abdomen and pelvis was performed following the standard protocol without IV contrast. COMPARISON:  None. FINDINGS: Lower chest: No acute abnormality. Aortic valve leaflet calcifications. Coronary artery calcifications. Sternotomy wires noted. Hepatobiliary: No focal liver abnormality. Calcified gallstone noted within the gallbladder lumen. Question pericholecystic fluid. Slight haziness of the gallbladder wall. No definite gallbladder wall thickening no biliary dilatation. Pancreas: No focal lesion. Normal pancreatic contour. No surrounding inflammatory changes. No main pancreatic ductal dilatation. Spleen: Normal in size without focal abnormality. Adrenals/Urinary Tract: There is a 2.4 cm right adrenal gland nodule with a density of -10 Hounsfield units consistent with nodule adenoma. Bilateral renal atrophy. Multiple fluid density lesions likely represent simple renal cysts with the largest on the right measuring up to 8.3 cm.  Subcentimeter densities are too small to characterize. There is a 1.3 cm lesion with a density of 26 Hounsfield units that is indeterminate (2:39) in the left kidney. Calcifications associated with kidneys appear to be vascular. No nephrolithiasis and no hydronephrosis. No definite contour-deforming renal mass. No ureterolithiasis or hydroureter. The urinary bladder is unremarkable. Stomach/Bowel: Stomach is within normal limits. No  evidence of bowel wall thickening or dilatation. Scattered colonic diverticulosis. The appendix not definitely identified. Vascular/Lymphatic: No abdominal aorta or iliac aneurysm. Severe atherosclerotic plaque of the aorta and its branches. No abdominal, pelvic, or inguinal lymphadenopathy. Reproductive: Prostate is unremarkable. Other: No intraperitoneal free fluid. No intraperitoneal free gas. No organized fluid collection. Musculoskeletal: Status post right repair with. Likely left repair. No definite inguinal hernia repair. No suspicious lytic or blastic osseous lesions. No acute displaced fracture. Multilevel degenerative changes of the spine. IMPRESSION: 1. Cholelithiasis with question pericholecystic fluid and slight haziness of the gallbladder wall. Recommend right upper quadrant ultrasound for a more sensitive evaluation of the gallbladder. 2. Indeterminate 1.3 cm left renal lesion. Recommend nonemergent MRI renal protocol for further evaluation. When the patient is clinically stable and able to follow directions and hold their breath (preferably as an outpatient) further evaluation with dedicated abdominal MRI should be considered. 3. Colonic diverticulosis with no acute diverticulitis. 4. 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 5.  Aortic Atherosclerosis (ICD10-I70.0)-severe. Electronically Signed   By: Iven Finn M.D.   On: 04/05/2021 19:18   DG Chest 2 View  Result Date: 04/05/2021 CLINICAL DATA:  Chest pain for 45 minutes, central chest pain and epigastric pain EXAM: CHEST - 2 VIEW COMPARISON:  08/30/2020 FINDINGS: Frontal and lateral views of the chest demonstrate postsurgical changes from prior median sternotomy. The cardiac silhouette is unremarkable. No acute airspace disease, effusion, or pneumothorax. No acute bony abnormalities. IMPRESSION: 1. No acute intrathoracic process. Electronically Signed   By: Randa Ngo M.D.   On: 04/05/2021 15:06    Procedures Procedures    Medications Ordered in ED Medications  alum & mag hydroxide-simeth (MAALOX/MYLANTA) 200-200-20 MG/5ML suspension 30 mL (0 mLs Oral Hold 04/05/21 1610)    And  lidocaine (XYLOCAINE) 2 % viscous mouth solution 15 mL (0 mLs Oral Hold 04/05/21 1610)  HYDROmorphone (DILAUDID) injection 0.5 mg (has no administration in time range)  ondansetron (ZOFRAN) injection 4 mg (has no administration in time range)  ondansetron (ZOFRAN-ODT) disintegrating tablet 4 mg (4 mg Oral Given 04/05/21 1609)  HYDROmorphone (DILAUDID) injection 0.5 mg (0.5 mg Intravenous Given 04/05/21 1845)  ondansetron (ZOFRAN) injection 4 mg (4 mg Intravenous Given 04/05/21 1931)  hydrALAZINE (APRESOLINE) injection 5 mg (5 mg Intravenous Given 04/05/21 1932)    ED Course  I have reviewed the triage vital signs and the nursing notes.  Pertinent labs & imaging results that were available during my care of the patient were reviewed by me and considered in my medical decision making (see chart for details). CRITICAL CARE Performed by: Milton Ferguson Total critical care time: 40 minutes Critical care time was exclusive of separately billable procedures and treating other patients. Critical care was necessary to treat or prevent imminent or life-threatening deterioration. Critical care was time spent personally by me on the following activities: development of treatment plan with patient and/or surrogate as well as nursing, discussions with consultants, evaluation of patient's response to treatment, examination of patient, obtaining history from patient or surrogate, ordering and performing treatments and interventions, ordering and review of laboratory studies, ordering and review of  radiographic studies, pulse oximetry and re-evaluation of patient's condition.  Patient with epigastric discomfort.  Patient has new EKG changes and mild elevated troponins.  I spoke with cardiology and they stated they will see the patient in consult  tomorrow if they are admitted here.  Patient will be admitted to medicine.  Also patient had a CT scan that shows gallstones but cholecystitis not ruled out.  I spoke with Dr. Arnoldo Morale general surgery and he will see the patient tomorrow.  He suggest getting ultrasound of the gallbladder tomorrow MDM Rules/Calculators/A&P                           Patient will be admitted for epigastric pain for possible cholecystitis and possible ACS Final Clinical Impression(s) / ED Diagnoses Final diagnoses:  Pain of upper abdomen    Rx / DC Orders ED Discharge Orders     None        Milton Ferguson, MD 04/06/21 1459

## 2021-04-05 NOTE — ED Notes (Signed)
Pt vomited in triage, says feels much better

## 2021-04-05 NOTE — ED Notes (Signed)
Pt requests medication for pain and nausea, Dr. Josephine Cables notified.

## 2021-04-06 ENCOUNTER — Inpatient Hospital Stay (HOSPITAL_COMMUNITY): Payer: No Typology Code available for payment source

## 2021-04-06 ENCOUNTER — Other Ambulatory Visit (HOSPITAL_COMMUNITY): Payer: Self-pay | Admitting: *Deleted

## 2021-04-06 DIAGNOSIS — K8020 Calculus of gallbladder without cholecystitis without obstruction: Secondary | ICD-10-CM | POA: Diagnosis not present

## 2021-04-06 DIAGNOSIS — R778 Other specified abnormalities of plasma proteins: Secondary | ICD-10-CM

## 2021-04-06 DIAGNOSIS — N400 Enlarged prostate without lower urinary tract symptoms: Secondary | ICD-10-CM | POA: Diagnosis not present

## 2021-04-06 DIAGNOSIS — I350 Nonrheumatic aortic (valve) stenosis: Secondary | ICD-10-CM

## 2021-04-06 DIAGNOSIS — N186 End stage renal disease: Secondary | ICD-10-CM

## 2021-04-06 DIAGNOSIS — R079 Chest pain, unspecified: Secondary | ICD-10-CM

## 2021-04-06 DIAGNOSIS — I25810 Atherosclerosis of coronary artery bypass graft(s) without angina pectoris: Secondary | ICD-10-CM

## 2021-04-06 DIAGNOSIS — I5032 Chronic diastolic (congestive) heart failure: Secondary | ICD-10-CM | POA: Diagnosis not present

## 2021-04-06 DIAGNOSIS — R1013 Epigastric pain: Secondary | ICD-10-CM | POA: Diagnosis not present

## 2021-04-06 LAB — ECHOCARDIOGRAM COMPLETE
AR max vel: 1.04 cm2
AV Area VTI: 1.2 cm2
AV Area mean vel: 1.12 cm2
AV Mean grad: 17.3 mmHg
AV Peak grad: 30.7 mmHg
Ao pk vel: 2.77 m/s
Area-P 1/2: 4.06 cm2
Calc EF: 47.8 %
Height: 69 in
MV VTI: 2.77 cm2
P 1/2 time: 376 msec
S' Lateral: 2.75 cm
Single Plane A2C EF: 47.7 %
Single Plane A4C EF: 50.6 %
Weight: 2906.54 oz

## 2021-04-06 LAB — COMPREHENSIVE METABOLIC PANEL
ALT: 13 U/L (ref 0–44)
AST: 24 U/L (ref 15–41)
Albumin: 4.4 g/dL (ref 3.5–5.0)
Alkaline Phosphatase: 95 U/L (ref 38–126)
Anion gap: 16 — ABNORMAL HIGH (ref 5–15)
BUN: 37 mg/dL — ABNORMAL HIGH (ref 8–23)
CO2: 26 mmol/L (ref 22–32)
Calcium: 9.1 mg/dL (ref 8.9–10.3)
Chloride: 92 mmol/L — ABNORMAL LOW (ref 98–111)
Creatinine, Ser: 5.18 mg/dL — ABNORMAL HIGH (ref 0.61–1.24)
GFR, Estimated: 11 mL/min — ABNORMAL LOW (ref >60)
Glucose, Bld: 126 mg/dL — ABNORMAL HIGH (ref 70–99)
Potassium: 3.8 mmol/L (ref 3.5–5.1)
Sodium: 134 mmol/L — ABNORMAL LOW (ref 135–145)
Total Bilirubin: 0.8 mg/dL (ref 0.3–1.2)
Total Protein: 8.7 g/dL — ABNORMAL HIGH (ref 6.5–8.1)

## 2021-04-06 LAB — CBC
HCT: 42.6 % (ref 39.0–52.0)
Hemoglobin: 13.7 g/dL (ref 13.0–17.0)
MCH: 31.6 pg (ref 26.0–34.0)
MCHC: 32.2 g/dL (ref 30.0–36.0)
MCV: 98.2 fL (ref 80.0–100.0)
Platelets: 162 10*3/uL (ref 150–400)
RBC: 4.34 MIL/uL (ref 4.22–5.81)
RDW: 15.9 % — ABNORMAL HIGH (ref 11.5–15.5)
WBC: 6.5 10*3/uL (ref 4.0–10.5)
nRBC: 0 % (ref 0.0–0.2)

## 2021-04-06 LAB — TROPONIN I (HIGH SENSITIVITY)
Troponin I (High Sensitivity): 89 ng/L — ABNORMAL HIGH (ref <18)
Troponin I (High Sensitivity): 150 ng/L (ref <18)
Troponin I (High Sensitivity): 328 ng/L (ref <18)
Troponin I (High Sensitivity): 653 ng/L (ref <18)

## 2021-04-06 LAB — APTT: aPTT: 34 seconds (ref 24–36)

## 2021-04-06 LAB — MAGNESIUM: Magnesium: 1.7 mg/dL (ref 1.7–2.4)

## 2021-04-06 LAB — HEPARIN LEVEL (UNFRACTIONATED)
Heparin Unfractionated: 0.17 IU/mL — ABNORMAL LOW (ref 0.30–0.70)
Heparin Unfractionated: 0.33 IU/mL (ref 0.30–0.70)

## 2021-04-06 LAB — PHOSPHORUS: Phosphorus: 6.4 mg/dL — ABNORMAL HIGH (ref 2.5–4.6)

## 2021-04-06 MED ORDER — VITAMIN D 25 MCG (1000 UNIT) PO TABS
1000.0000 [IU] | ORAL_TABLET | Freq: Every day | ORAL | Status: DC
  Administered 2021-04-06 – 2021-04-09 (×4): 1000 [IU] via ORAL
  Filled 2021-04-06 (×4): qty 1

## 2021-04-06 MED ORDER — MAGNESIUM SULFATE 2 GM/50ML IV SOLN
2.0000 g | Freq: Once | INTRAVENOUS | Status: AC
  Administered 2021-04-06: 2 g via INTRAVENOUS
  Filled 2021-04-06: qty 50

## 2021-04-06 MED ORDER — TAMSULOSIN HCL 0.4 MG PO CAPS
0.4000 mg | ORAL_CAPSULE | Freq: Every day | ORAL | Status: DC
  Administered 2021-04-06 – 2021-04-09 (×4): 0.4 mg via ORAL
  Filled 2021-04-06 (×4): qty 1

## 2021-04-06 MED ORDER — HYDRALAZINE HCL 25 MG PO TABS: 100.0000 mg | ORAL_TABLET | Freq: Three times a day (TID) | ORAL | Status: DC

## 2021-04-06 MED ORDER — KETOROLAC TROMETHAMINE 15 MG/ML IJ SOLN
15.0000 mg | Freq: Once | INTRAMUSCULAR | Status: AC
Start: 2021-04-06 — End: 2021-04-06
  Administered 2021-04-06: 15 mg via INTRAVENOUS
  Filled 2021-04-06: qty 1

## 2021-04-06 MED ORDER — RA FISH OIL 1000 MG PO CAPS: 2000.0000 mg | ORAL_CAPSULE | Freq: Two times a day (BID) | ORAL | Status: DC

## 2021-04-06 MED ORDER — HEPARIN (PORCINE) 25000 UT/250ML-% IV SOLN
1850.0000 [IU]/h | INTRAVENOUS | Status: DC
  Administered 2021-04-06: 20:00:00 1300 [IU]/h via INTRAVENOUS
  Administered 2021-04-06: 1000 [IU]/h via INTRAVENOUS
  Administered 2021-04-07: 16:00:00 1450 [IU]/h via INTRAVENOUS
  Administered 2021-04-08 – 2021-04-09 (×2): 1850 [IU]/h via INTRAVENOUS
  Filled 2021-04-06 (×7): qty 250

## 2021-04-06 MED ORDER — CARVEDILOL 3.125 MG PO TABS
3.1250 mg | ORAL_TABLET | Freq: Two times a day (BID) | ORAL | Status: DC
  Administered 2021-04-06 – 2021-04-08 (×5): 3.125 mg via ORAL
  Filled 2021-04-06 (×6): qty 1

## 2021-04-06 MED ORDER — ISOSORBIDE MONONITRATE ER 60 MG PO TB24
120.0000 mg | ORAL_TABLET | Freq: Every day | ORAL | Status: DC
  Administered 2021-04-06 – 2021-04-08 (×2): 120 mg via ORAL
  Filled 2021-04-06 (×3): qty 2

## 2021-04-06 MED ORDER — OMEGA-3-ACID ETHYL ESTERS 1 G PO CAPS
1.0000 g | ORAL_CAPSULE | Freq: Two times a day (BID) | ORAL | Status: DC
  Administered 2021-04-06 – 2021-04-09 (×6): 1 g via ORAL
  Filled 2021-04-06 (×7): qty 1

## 2021-04-06 MED ORDER — LEVOTHYROXINE SODIUM 50 MCG PO TABS
50.0000 ug | ORAL_TABLET | Freq: Every day | ORAL | Status: DC
  Administered 2021-04-06 – 2021-04-09 (×4): 50 ug via ORAL
  Filled 2021-04-06 (×4): qty 1

## 2021-04-06 MED ORDER — ATORVASTATIN CALCIUM 40 MG PO TABS
80.0000 mg | ORAL_TABLET | Freq: Every day | ORAL | Status: DC
  Administered 2021-04-06 – 2021-04-08 (×3): 80 mg via ORAL
  Filled 2021-04-06 (×3): qty 2

## 2021-04-06 MED ORDER — CLOPIDOGREL BISULFATE 75 MG PO TABS
75.0000 mg | ORAL_TABLET | Freq: Every day | ORAL | Status: DC
  Administered 2021-04-06 – 2021-04-09 (×4): 75 mg via ORAL
  Filled 2021-04-06 (×4): qty 1

## 2021-04-06 MED ORDER — CHLORHEXIDINE GLUCONATE CLOTH 2 % EX PADS
6.0000 | MEDICATED_PAD | Freq: Every day | CUTANEOUS | Status: DC
  Administered 2021-04-06 – 2021-04-09 (×4): 6 via TOPICAL

## 2021-04-06 MED ORDER — HEPARIN BOLUS VIA INFUSION
4000.0000 [IU] | Freq: Once | INTRAVENOUS | Status: AC
  Administered 2021-04-06: 4000 [IU] via INTRAVENOUS
  Filled 2021-04-06: qty 4000

## 2021-04-06 MED ORDER — NITROGLYCERIN 0.4 MG SL SUBL: 0.4000 mg | SUBLINGUAL_TABLET | SUBLINGUAL | Status: DC | PRN

## 2021-04-06 MED ORDER — HEPARIN BOLUS VIA INFUSION
2500.0000 [IU] | Freq: Once | INTRAVENOUS | Status: AC
  Administered 2021-04-06: 2500 [IU] via INTRAVENOUS
  Filled 2021-04-06: qty 2500

## 2021-04-06 MED ORDER — FINASTERIDE 5 MG PO TABS
5.0000 mg | ORAL_TABLET | Freq: Every day | ORAL | Status: DC
  Administered 2021-04-06 – 2021-04-09 (×4): 5 mg via ORAL
  Filled 2021-04-06 (×4): qty 1

## 2021-04-06 MED ORDER — FUROSEMIDE 80 MG PO TABS
80.0000 mg | ORAL_TABLET | ORAL | Status: DC
  Administered 2021-04-06 – 2021-04-08 (×2): 80 mg via ORAL
  Filled 2021-04-06 (×2): qty 1

## 2021-04-06 NOTE — Consult Note (Signed)
[facility] Renal Consultation Note    Indication for Consultation:  Management of ESRD/hemodialysis; anemia, hypertension/volume and secondary hyperparathyroidism  HPI: [name] is a 75 y.o. male with a PMH significant for CAD s/p CABG, HTN, h/o bladder cancer, COPD,  HLD, hypothyroidism, BPH, chronic diastolic CHF, and ESRD on HD MWF at [facility] who presented to [facility] ED on 04/05/21 with epigastric pain that started yesterday afternoon.  Described as a burning sensation in the middle of his stomach and was concerned that it might be his heart.  The pain persisted and increased and was associated with N/V which prompted his visit to the ED.  In the ED, BP was elevated at 204/52, BUN 29, Cr 4.62, lipase 56, normal LFT's.  CXR without abnormality.  CT of abd and pelvis without contrast revealed choledocholithiasis with a question of pericholecystic fluid.  He was admitted for further evaluation and possible acute cholecystitis.  Cardiology was consulted due to ST and T wave abnormalities but normal troponins so far.  We were consulted to provide HD during his hospitalization.  He did go to dialysis on Monday and completed a full treatment without any issues.  He is feeling better today but still has some abdominal pain.  Past Medical History:  Diagnosis Date   Acute myocardial infarction of other inferior wall, initial episode of care    Anemia    Anginal pain ([place])    Anxiety    Arthritis    Cancer ([place])    bladder 2013 removed, has cystoscopy 10/2016, has returned x 2   COPD (chronic obstructive pulmonary disease) ([place])    Coronary artery disease    Artery bypass graft Jan 2008   Dialysis patient [facility])    Dyspnea    Dysrhythmia    GERD (gastroesophageal reflux disease)    Heart murmur    History of hiatal hernia    Hyperlipidemia    Hypertension    Hypothyroidism    PONV (postoperative nausea and vomiting)    Renal disorder    Sleep apnea    Stroke [facility])     Tobacco user    Past Surgical History:  Procedure Laterality Date   APPENDECTOMY     AV FISTULA PLACEMENT Right 02/11/2020   Procedure: RIGHT ARM BRACHIOCEPHALIC ARTERIOVENOUS (AV) FISTULA;  Surgeon: Angelia Mould, MD;  Location: [place];  Service: Vascular;  Laterality: Right;   CARDIAC CATHETERIZATION     COLONOSCOPY WITH PROPOFOL N/A 09/02/2020   Procedure: COLONOSCOPY WITH PROPOFOL;  Surgeon: Rogene Houston, MD;  [facility];  Service: Endoscopy;  Laterality: N/A;   CORONARY ARTERY BYPASS GRAFT  06/14/2006   x 5   CORONARY ARTERY BYPASS GRAFT N/A 09/19/2016   Procedure: REDO CORONARY ARTERY BYPASS GRAFTING (CABG) x two, using right internal mammary artery and left radial artery;  Surgeon: [name], MD;  Location: [place];  Service: Open Heart Surgery;  Laterality: N/A;   FOOT SURGERY     HEMOSTASIS CLIP PLACEMENT  09/02/2020   Procedure: HEMOSTASIS CLIP PLACEMENT;  Surgeon: Rogene Houston, MD;  [facility];  Service: Endoscopy;;   HERNIA REPAIR     HOT HEMOSTASIS  09/02/2020   Procedure: HOT HEMOSTASIS (ARGON PLASMA COAGULATION/BICAP);  Surgeon: Rogene Houston, MD;  [facility];  Service: Endoscopy;;   LEFT HEART CATH AND CORS/GRAFTS ANGIOGRAPHY N/A 09/14/2016   Procedure: Left Heart Cath and Cors/Grafts Angiography;  Surgeon: Troy Sine, MD;  Location:  [place] INVASIVE CV LAB;  Service: Cardiovascular;  Laterality: N/A;   LEFT HEART CATHETERIZATION WITH CORONARY/GRAFT ANGIOGRAM N/A 01/31/2014   Procedure: LEFT HEART CATHETERIZATION WITH Beatrix Fetters;  Surgeon: Sanda Klein, MD;  Location: [place] CATH LAB;  Service: Cardiovascular;  Laterality: N/A;   NOSE SURGERY     PERCUTANEOUS CORONARY STENT INTERVENTION (PCI-S)  01/31/2014   Procedure: PERCUTANEOUS CORONARY STENT INTERVENTION (PCI-S);  Surgeon: Sanda Klein, MD;  Location: [facility] CATH LAB;  Service: Cardiovascular;;   POLYPECTOMY  09/02/2020   Procedure: POLYPECTOMY;  Surgeon:  Rogene Houston, MD;  [facility];  Service: Endoscopy;;   RADIAL ARTERY HARVEST Left 09/19/2016   Procedure: RADIAL ARTERY HARVEST;  Surgeon: [name], MD;  Location: [place];  Service: Open Heart Surgery;  Laterality: Left;   TEE WITHOUT CARDIOVERSION N/A 09/19/2016   Procedure: TRANSESOPHAGEAL ECHOCARDIOGRAM (TEE);  Surgeon: [name], MD;  Location: [place];  Service: Open Heart Surgery;  Laterality: N/A;   Family History:   Family History  Problem Relation Age of Onset   Heart attack Mother    Heart attack Brother    Colon cancer Neg Hx    Colon polyps Neg Hx    Social History:  reports that he quit smoking about 4 years ago. His smoking use included cigarettes. He started smoking about 62 years ago. He has a 40.00 pack-year smoking history. He has never used smokeless tobacco. He reports that he does not drink alcohol and does not use drugs. Allergies  Allergen Reactions   Cardizem Cd [Diltiazem Hcl Er Beads] Palpitations    "Makes heart skip"   Cardizem [Diltiazem]    Prior to Admission medications   Medication Sig Start Date End Date Taking? Authorizing Provider  acetaminophen (TYLENOL) 325 MG tablet Take 2 tablets (650 mg total) by mouth every 6 (six) hours as needed for mild pain. 09/23/16   Nani Skillern, PA-C  allopurinol (ZYLOPRIM) 100 MG tablet Take 100 mg by mouth daily.  05/03/19   [provider]  atorvastatin (LIPITOR) 80 MG tablet Take 80 mg by mouth daily.    [provider]  carvedilol (COREG) 3.125 MG tablet Take 1 tablet (3.125 mg total) by mouth 2 (two) times daily. 05/10/20 05/10/21  Debbe Odea, MD  clopidogrel (PLAVIX) 75 MG tablet Take 75 mg by mouth daily.    [provider]  epoetin alfa (EPOGEN) 20000 UNIT/ML injection Inject 20,000 Units into the skin every Monday. Patient not taking: No sig reported    [provider]  finasteride (PROSCAR) 5 MG tablet Take 5 mg by mouth daily.    [provider]  folic acid (FOLVITE) 1 MG tablet Take 1 mg by mouth daily.    [provider]  furosemide (LASIX) 80 MG tablet Take 1 tablet (80 mg total) by mouth every Tuesday, Thursday, Saturday, and Sunday. 09/05/20   Orson Eva, MD  hydrALAZINE (APRESOLINE) 100 MG tablet Take 1 tablet (100 mg total) by mouth 3 (three) times daily. 05/18/19   Roxan Hockey, MD  isosorbide mononitrate (IMDUR) 120 MG 24 hr tablet Take 1 tablet (120 mg total) by mouth daily. Patient taking differently: Take 240 mg by mouth daily. 05/18/19   Roxan Hockey, MD  levothyroxine (SYNTHROID, LEVOTHROID) 50 MCG tablet Take 50 mcg by mouth daily before breakfast.    [provider]  magnesium oxide (MAG-OX) 400 MG tablet Take 1 tablet (400 mg total) by mouth daily. 08/11/20   Roxan Hockey, MD  nitroGLYCERIN (NITROSTAT) 0.4 MG SL tablet Place 0.4 mg under the tongue every 5 (five) minutes as needed for chest pain (max 3 doses).    [provider]  Omega-3 Fatty Acids (RA FISH OIL) 1000 MG CAPS Take 2,000 mg by mouth 2 (two) times daily.    [provider]  omeprazole (PRILOSEC) 20 MG capsule Take 20 mg by mouth 2 (two) times daily. 02/14/20   [provider]  tamsulosin (FLOMAX) 0.4 MG CAPS capsule Take 0.4 mg by mouth daily.    [provider]  Vitamin D, Cholecalciferol, 25 MCG (1000 UT) CAPS Take 1,000 Units by mouth daily.    [provider]   Current Facility-Administered Medications  Medication Dose Route Frequency Provider Last Rate Last Admin   alum & mag hydroxide-simeth (MAALOX/MYLANTA) 200-200-20 MG/5ML suspension 30 mL  30 mL Oral Once Adefeso, Oladapo, DO       And   lidocaine (XYLOCAINE) 2 % viscous mouth solution 15 mL  15 mL Oral Once Adefeso, Oladapo, DO       atorvastatin (LIPITOR) tablet 80 mg  80 mg Oral q1800 Adefeso, Oladapo, DO       carvedilol (COREG) tablet 3.125 mg  3.125 mg Oral BID Adefeso, Oladapo, DO       cholecalciferol  (VITAMIN D3) tablet 1,000 Units  1,000 Units Oral Daily Adefeso, Oladapo, DO       clopidogrel (PLAVIX) tablet 75 mg  75 mg Oral Daily Adefeso, Oladapo, DO       finasteride (PROSCAR) tablet 5 mg  5 mg Oral Daily Adefeso, Oladapo, DO       furosemide (LASIX) tablet 80 mg  80 mg Oral Q T,Th,S,Su Adefeso, Oladapo, DO       heparin ADULT infusion 100 units/mL (25000 units/255mL)  1,000 Units/hr Intravenous Continuous Adefeso, Oladapo, DO 10 mL/hr at 04/06/21 0444 1,000 Units/hr at 04/06/21 0444   hydrALAZINE (APRESOLINE) injection 10 mg  10 mg Intravenous Q6H PRN Adefeso, Oladapo, DO   10 mg at 04/06/21 0349   hydrALAZINE (APRESOLINE) tablet 100 mg  100 mg Oral TID Bernadette Hoit, DO       HYDROmorphone (DILAUDID) injection 0.5 mg  0.5 mg Intravenous Q4H PRN Adefeso, Oladapo, DO   0.5 mg at 04/06/21 0208   isosorbide mononitrate (IMDUR) 24 hr tablet 120 mg  120 mg Oral Daily Adefeso, Oladapo, DO       levothyroxine (SYNTHROID) tablet 50 mcg  50 mcg Oral QAC breakfast Adefeso, Oladapo, DO       nitroGLYCERIN (NITROSTAT) SL tablet 0.4 mg  0.4 mg Sublingual Q5 min PRN Adefeso, Oladapo, DO       omega-3 acid ethyl esters (LOVAZA) capsule 1 g  1 g Oral BID Adefeso, Oladapo, DO       ondansetron (ZOFRAN) injection 4 mg  4 mg Intravenous Q6H PRN Adefeso, Oladapo, DO   4 mg at 04/05/21 2252   pantoprazole (PROTONIX) injection 40 mg  40 mg Intravenous Q12H Adefeso, Oladapo, DO   40 mg at 04/05/21 2251   prochlorperazine (COMPAZINE) injection 5 mg  5 mg Intravenous Q6H PRN Adefeso, Oladapo, DO   5 mg at 04/05/21 2344   tamsulosin (FLOMAX) capsule 0.4 mg  0.4 mg Oral Daily Bernadette Hoit, DO       Labs: Basic Metabolic Panel: Recent Labs  Lab 04/05/21 1446 04/06/21 0023  NA 137 134*  K 3.8 3.8  CL 92* 92*  CO2 30 26  GLUCOSE 95 126*  BUN 29*  37*  CREATININE 4.63* 5.18*  CALCIUM 8.8* 9.1  PHOS  --  6.4*   Liver Function Tests: Recent Labs  Lab 04/05/21 1446 04/06/21 0023  AST 20 24  ALT  12 13  ALKPHOS 81 95  BILITOT 0.6 0.8  PROT 7.8 8.7*  ALBUMIN 4.0 4.4   Recent Labs  Lab 04/05/21 1446  LIPASE 56*   No results for input(s): AMMONIA in the last 168 hours. CBC: Recent Labs  Lab 04/05/21 1446 04/06/21 0023  WBC 4.1 6.5  HGB 12.1* 13.7  HCT 37.4* 42.6  MCV 99.2 98.2  PLT 179 162   Cardiac Enzymes: No results for input(s): CKTOTAL, CKMB, CKMBINDEX, TROPONINI in the last 168 hours. CBG: No results for input(s): GLUCAP in the last 168 hours. Iron Studies: No results for input(s): IRON, TIBC, TRANSFERRIN, FERRITIN in the last 72 hours. Studies/Results: CT ABDOMEN PELVIS WO CONTRAST  Result Date: 04/05/2021 CLINICAL DATA:  Possible abscess/ infection Nausea/ vomitting EXAM: CT ABDOMEN AND PELVIS WITHOUT CONTRAST TECHNIQUE: Multidetector CT imaging of the abdomen and pelvis was performed following the standard protocol without IV contrast. COMPARISON:  None. FINDINGS: Lower chest: No acute abnormality. Aortic valve leaflet calcifications. Coronary artery calcifications. Sternotomy wires noted. Hepatobiliary: No focal liver abnormality. Calcified gallstone noted within the gallbladder lumen. Question pericholecystic fluid. Slight haziness of the gallbladder wall. No definite gallbladder wall thickening no biliary dilatation. Pancreas: No focal lesion. Normal pancreatic contour. No surrounding inflammatory changes. No main pancreatic ductal dilatation. Spleen: Normal in size without focal abnormality. Adrenals/Urinary Tract: There is a 2.4 cm right adrenal gland nodule with a density of -10 Hounsfield units consistent with nodule adenoma. Bilateral renal atrophy. Multiple fluid density lesions likely represent simple renal cysts with the largest on the right measuring up to 8.3 cm. Subcentimeter densities are too small to characterize. There is a 1.3 cm lesion with a density of 26 Hounsfield units that is indeterminate (2:39) in the left kidney. Calcifications associated with  kidneys appear to be vascular. No nephrolithiasis and no hydronephrosis. No definite contour-deforming renal mass. No ureterolithiasis or hydroureter. The urinary bladder is unremarkable. Stomach/Bowel: Stomach is within normal limits. No evidence of bowel wall thickening or dilatation. Scattered colonic diverticulosis. The appendix not definitely identified. Vascular/Lymphatic: No abdominal aorta or iliac aneurysm. Severe atherosclerotic plaque of the aorta and its branches. No abdominal, pelvic, or inguinal lymphadenopathy. Reproductive: Prostate is unremarkable. Other: No intraperitoneal free fluid. No intraperitoneal free gas. No organized fluid collection. Musculoskeletal: Status post right repair with. Likely left repair. No definite inguinal hernia repair. No suspicious lytic or blastic osseous lesions. No acute displaced fracture. Multilevel degenerative changes of the spine. IMPRESSION: 1. Cholelithiasis with question pericholecystic fluid and slight haziness of the gallbladder wall. Recommend right upper quadrant ultrasound for a more sensitive evaluation of the gallbladder. 2. Indeterminate 1.3 cm left renal lesion. Recommend nonemergent MRI renal protocol for further evaluation. When the patient is clinically stable and able to follow directions and hold their breath (preferably as an outpatient) further evaluation with dedicated abdominal MRI should be considered. 3. Colonic diverticulosis with no acute diverticulitis. 4. 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 5.  Aortic Atherosclerosis (ICD10-I70.0)-severe. Electronically Signed   By: Iven Finn M.D.   On: 04/05/2021 19:18   DG Chest 2 View  Result Date: 04/05/2021 CLINICAL DATA:  Chest pain for 45 minutes, central chest pain and epigastric pain EXAM: CHEST - 2 VIEW COMPARISON:  08/30/2020 FINDINGS: Frontal and lateral views of the chest demonstrate  postsurgical changes from prior median sternotomy. The cardiac silhouette is unremarkable. No  acute airspace disease, effusion, or pneumothorax. No acute bony abnormalities. IMPRESSION: 1. No acute intrathoracic process. Electronically Signed   By: Randa Ngo M.D.   On: 04/05/2021 15:06    ROS: Pertinent items are noted in HPI. Physical Exam: Vitals:   04/05/21 2200 04/05/21 2332 04/06/21 0336 04/06/21 0500  BP: (!) 198/49 (!) 170/94 (!) 185/61   Pulse: 70 74 91   Resp: 20 18    Temp:  (!) 97.5 F (36.4 C) 98.5 F (36.9 C)   TempSrc:  Oral Oral   SpO2: 94% 99% 95%   Weight:    82.4 kg  Height:          Weight change:   Intake/Output Summary (Last 24 hours) at 04/06/2021 0939 Last data filed at 04/06/2021 0444 Gross per 24 hour  Intake 39.5 ml  Output 200 ml  Net -160.5 ml   BP (!) 185/61 (BP Location: Left Arm)   Pulse 91   Temp 98.5 F (36.9 C) (Oral)   Resp 18   Ht 5\' 9"  (1.753 m)   Wt 82.4 kg   SpO2 95%   BMI 26.83 kg/m  General appearance: alert, cooperative, and no distress Head: Normocephalic, without obvious abnormality, atraumatic Resp: clear to auscultation bilaterally Cardio: regular rate and rhythm, S1, S2 normal, no murmur, click, rub or gallop GI: +BS, soft, mild tenderness to epigastric area to deep palpation, no guarding or rebound, +Murphy's sign and RUQ pain. Extremities: extremities normal, atraumatic, no cyanosis or edema and RUE AVF +T/B Dialysis Access:  Dialysis Orders: Center: [facility]  on MWF . EDW 83 kg HD [place] 2K/2.5Ca  Time 4 hours Heparin none. Access  RUE AVF BFR 400 DFR 500    Calcitriol 0.5 mcg po/HD Epogen 5000   Units IV/HD  Venofer  100 mg IV/HD last dose for 04/07/21   Assessment/Plan:  Abdominal pain associated with N/V - CT with choledocholithiasis and + Murphy's sign.  Surgery has been consulted.  Await RUQ [place].  ESRD -  will plan for HD tomorrow to keep on MWF schedule.  Hypertension/volume  - elevated BP, UF with HD.  He is at his EDW.  Anemia  - stable, hold ESA  Metabolic bone disease -  resume home meds  when taking po  Nutrition - per primary CAD s/p CABG - Cardiology consulted due to ST and T wave abnormalities.  Donetta Potts, MD [place] Pager [phone] 04/06/2021, 9:39 AM

## 2021-04-06 NOTE — Progress Notes (Signed)
ANTICOAGULATION CONSULT NOTE -   Pharmacy Consult for Heparin Indication: chest pain/ACS  Allergies  Allergen Reactions   Cardizem Cd [Diltiazem Hcl Er Beads] Palpitations    "Makes heart skip"   Cardizem [Diltiazem]     Patient Measurements: Height: 5\' 9"  (175.3 cm) Weight: 82.4 kg (181 lb 10.5 oz) IBW/kg (Calculated) : 70.7 HEPARIN DW (KG): 83   Vital Signs: Temp: 98.5 F (36.9 C) (11/22 0336) Temp Source: Oral (11/22 0336) BP: 185/61 (11/22 0336) Pulse Rate: 91 (11/22 0336)  Labs: Recent Labs    04/05/21 1446 04/05/21 1643 04/06/21 0023 04/06/21 0237 04/06/21 0614 04/06/21 0809 04/06/21 1138  HGB 12.1*  --  13.7  --   --   --   --   HCT 37.4*  --  42.6  --   --   --   --   PLT 179  --  162  --   --   --   --   APTT  --   --  34  --   --   --   --   HEPARINUNFRC  --   --   --   --   --   --  0.17*  CREATININE 4.63*  --  5.18*  --   --   --   --   TROPONINIHS 25*   < > 89* 150* 328* 653*  --    < > = values in this interval not displayed.     Estimated Creatinine Clearance: 12.5 mL/min (A) (by C-G formula based on SCr of 5.18 mg/dL (H)).   Medical History: Past Medical History:  Diagnosis Date   Acute myocardial infarction of other inferior wall, initial episode of care    Anemia    Anginal pain ([place])    Anxiety    Arthritis    Cancer ([place])    bladder 2013 removed, has cystoscopy 10/2016, has returned x 2   COPD (chronic obstructive pulmonary disease) ([place])    Coronary artery disease    Artery bypass graft Jan 2008   Dialysis patient [facility])    Dyspnea    Dysrhythmia    GERD (gastroesophageal reflux disease)    Heart murmur    History of hiatal hernia    Hyperlipidemia    Hypertension    Hypothyroidism    PONV (postoperative nausea and vomiting)    Renal disorder    Sleep apnea    Stroke ([place])    Tobacco user     Medications:  No current facility-administered medications on file prior to encounter.   Current Outpatient Medications on File  Prior to Encounter  Medication Sig Dispense Refill   acetaminophen (TYLENOL) 325 MG tablet Take 2 tablets (650 mg total) by mouth every 6 (six) hours as needed for mild pain.     allopurinol (ZYLOPRIM) 100 MG tablet Take 100 mg by mouth daily.      atorvastatin (LIPITOR) 80 MG tablet Take 80 mg by mouth daily.     carvedilol (COREG) 3.125 MG tablet Take 1 tablet (3.125 mg total) by mouth 2 (two) times daily. 60 tablet 3   clopidogrel (PLAVIX) 75 MG tablet Take 75 mg by mouth daily.     finasteride (PROSCAR) 5 MG tablet Take 5 mg by mouth daily.     folic acid (FOLVITE) 1 MG tablet Take 1 mg by mouth daily.     furosemide (LASIX) 80 MG tablet Take 1 tablet (80 mg total) by mouth every Tuesday, Thursday, Saturday,  and Sunday.     isosorbide mononitrate (IMDUR) 120 MG 24 hr tablet Take 1 tablet (120 mg total) by mouth daily. (Patient taking differently: Take 240 mg by mouth daily.) 30 tablet 5   levothyroxine (SYNTHROID, LEVOTHROID) 50 MCG tablet Take 50 mcg by mouth daily before breakfast.     nitroGLYCERIN (NITROSTAT) 0.4 MG SL tablet Place 0.4 mg under the tongue every 5 (five) minutes as needed for chest pain (max 3 doses).     Omega-3 Fatty Acids (RA FISH OIL) 1000 MG CAPS Take 2,000 mg by mouth 2 (two) times daily.     omeprazole (PRILOSEC) 20 MG capsule Take 20 mg by mouth 2 (two) times daily.     tamsulosin (FLOMAX) 0.4 MG CAPS capsule Take 0.4 mg by mouth daily.     Vitamin D, Cholecalciferol, 25 MCG (1000 UT) CAPS Take 1,000 Units by mouth daily.     epoetin alfa (EPOGEN) 20000 UNIT/ML injection Inject 20,000 Units into the skin every Monday. (Patient not taking: Reported on 04/06/2021)     hydrALAZINE (APRESOLINE) 100 MG tablet Take 1 tablet (100 mg total) by mouth 3 (three) times daily. (Patient not taking: Reported on 04/06/2021) 270 tablet 9   magnesium oxide (MAG-OX) 400 MG tablet Take 1 tablet (400 mg total) by mouth daily. (Patient not taking: Reported on 04/06/2021) 30 tablet 2      Assessment: 75 y.o. male admitted with RUQ/epigastric pain which lasted for 12+ hours and experienced associated nausea and vomiting with this.. He has elevated troponin, possible ACS, for heparin  HL 0.17, subtherapeutic, no issues with infusion  Goal of Therapy:  Heparin level 0.3-0.7 units/ml Monitor platelets by anticoagulation protocol: Yes   Plan:  Heparin 2500 units IV bolus, then increase heparin infusion to 1300 units/hr Check heparin level in 8 hours and daily while on heparin Monitor for s/s of bleeding  Isac Sarna, BS [place], BCPS Clinical Pharmacist Pager [phone] 04/06/2021,1:22 PM

## 2021-04-06 NOTE — Progress Notes (Signed)
*  PRELIMINARY RESULTS* Echocardiogram 2D Echocardiogram has been performed.  [name] 04/06/2021, 4:11 PM

## 2021-04-06 NOTE — Progress Notes (Signed)
ANTICOAGULATION CONSULT NOTE - Initial Consult  Pharmacy Consult for Heparin Indication: chest pain/ACS  Allergies  Allergen Reactions   Cardizem Cd [Diltiazem Hcl Er Beads] Palpitations    "Makes heart skip"   Cardizem [Diltiazem]     Patient Measurements: Height: 5\' 9"  (175.3 cm) Weight: 83 kg (183 lb) IBW/kg (Calculated) : 70.7  Vital Signs: Temp: 98.5 F (36.9 C) (11/22 0336) Temp Source: Oral (11/22 0336) BP: 185/61 (11/22 0336) Pulse Rate: 91 (11/22 0336)  Labs: Recent Labs    04/05/21 1446 04/05/21 1643 04/06/21 0023 04/06/21 0237  HGB 12.1*  --  13.7  --   HCT 37.4*  --  42.6  --   PLT 179  --  162  --   APTT  --   --  34  --   CREATININE 4.63*  --  5.18*  --   TROPONINIHS 25* 29* 89* 150*    Estimated Creatinine Clearance: 12.5 mL/min (A) (by C-G formula based on SCr of 5.18 mg/dL (H)).   Medical History: Past Medical History:  Diagnosis Date   Acute myocardial infarction of other inferior wall, initial episode of care    Anemia    Anginal pain ([place])    Anxiety    Arthritis    Cancer ([place])    bladder 2013 removed, has cystoscopy 10/2016, has returned x 2   COPD (chronic obstructive pulmonary disease) ([place])    Coronary artery disease    Artery bypass graft Jan 2008   Dialysis patient [facility])    Dyspnea    Dysrhythmia    GERD (gastroesophageal reflux disease)    Heart murmur    History of hiatal hernia    Hyperlipidemia    Hypertension    Hypothyroidism    PONV (postoperative nausea and vomiting)    Renal disorder    Sleep apnea    Stroke ([place])    Tobacco user     Medications:  No current facility-administered medications on file prior to encounter.   Current Outpatient Medications on File Prior to Encounter  Medication Sig Dispense Refill   acetaminophen (TYLENOL) 325 MG tablet Take 2 tablets (650 mg total) by mouth every 6 (six) hours as needed for mild pain.     allopurinol (ZYLOPRIM) 100 MG tablet Take 100 mg by mouth daily.       atorvastatin (LIPITOR) 80 MG tablet Take 80 mg by mouth daily.     carvedilol (COREG) 3.125 MG tablet Take 1 tablet (3.125 mg total) by mouth 2 (two) times daily. 60 tablet 3   clopidogrel (PLAVIX) 75 MG tablet Take 75 mg by mouth daily.     epoetin alfa (EPOGEN) 20000 UNIT/ML injection Inject 20,000 Units into the skin every Monday. (Patient not taking: No sig reported)     finasteride (PROSCAR) 5 MG tablet Take 5 mg by mouth daily.     folic acid (FOLVITE) 1 MG tablet Take 1 mg by mouth daily.     furosemide (LASIX) 80 MG tablet Take 1 tablet (80 mg total) by mouth every Tuesday, Thursday, Saturday, and Sunday.     hydrALAZINE (APRESOLINE) 100 MG tablet Take 1 tablet (100 mg total) by mouth 3 (three) times daily. 270 tablet 9   isosorbide mononitrate (IMDUR) 120 MG 24 hr tablet Take 1 tablet (120 mg total) by mouth daily. (Patient taking differently: Take 240 mg by mouth daily.) 30 tablet 5   levothyroxine (SYNTHROID, LEVOTHROID) 50 MCG tablet Take 50 mcg by mouth daily before breakfast.  magnesium oxide (MAG-OX) 400 MG tablet Take 1 tablet (400 mg total) by mouth daily. 30 tablet 2   nitroGLYCERIN (NITROSTAT) 0.4 MG SL tablet Place 0.4 mg under the tongue every 5 (five) minutes as needed for chest pain (max 3 doses).     Omega-3 Fatty Acids (RA FISH OIL) 1000 MG CAPS Take 2,000 mg by mouth 2 (two) times daily.     omeprazole (PRILOSEC) 20 MG capsule Take 20 mg by mouth 2 (two) times daily.     tamsulosin (FLOMAX) 0.4 MG CAPS capsule Take 0.4 mg by mouth daily.     Vitamin D, Cholecalciferol, 25 MCG (1000 UT) CAPS Take 1,000 Units by mouth daily.       Assessment: 75 y.o. male with elevated troponin, possible ACS, for heparin  Goal of Therapy:  Heparin level 0.3-0.7 units/ml Monitor platelets by anticoagulation protocol: Yes   Plan:  Heparin 4000 units IV bolus, then start heparin 1000 units/hr Check heparin level in 8 hours.   Denishia Citro, Bronson Curb 04/06/2021,3:47 AM

## 2021-04-06 NOTE — Progress Notes (Signed)
EKG done and given to nurse 

## 2021-04-06 NOTE — TOC Progression Note (Signed)
Transition of Care [facility]) - Progression Note    Patient Details  Name: [name] MRN: [id number] Date of Birth: [birth year]  Transition of Care [facility]) CM/SW Contact  Boneta Lucks, RN Phone Number: 04/06/2021, 9:04 AM  Clinical Narrative:   Patient admitted with abdominal pain. Patient has [employer] insurance. TOC completed [place] notification [phone].

## 2021-04-06 NOTE — Plan of Care (Signed)
  Problem: Education: Goal: Knowledge of General Education information will improve Description Including pain rating scale, medication(s)/side effects and non-pharmacologic comfort measures Outcome: Progressing   Problem: Nutrition: Goal: Adequate nutrition will be maintained Outcome: Progressing   Problem: Pain Managment: Goal: General experience of comfort will improve Outcome: Progressing   

## 2021-04-06 NOTE — Consult Note (Signed)
Reason for Consult: Right upper quadrant abdominal pain Referring Physician: Dr. Telford Nab DONYA [name] is an 75 y.o. male.  HPI: Patient is a 75 year old white male with multiple medical problems including coronary artery disease, status post CABG in remote past, end-stage renal disease on dialysis, hypertension, history of bladder cancer who presented to the emergency room yesterday evening with worsening epigastric abdominal pain.  He was also nauseated.  He was seen in the emergency room and a CT scan of the abdomen revealed pericholecystic fluid and possible cholelithiasis.  He was admitted to the hospital for further evaluation and treatment.  Of note is the fact that the patient has rising troponin levels.  This morning, the patient states his epigastric pain seems to have mostly resolved.  This was his first episode.  He was able to eat breakfast without difficulty.  Patient denies any history of fatty food intolerance, fever, chills, or jaundice.  Past Medical History:  Diagnosis Date   Acute myocardial infarction of other inferior wall, initial episode of care    Anemia    Anginal pain ([place])    Anxiety    Arthritis    Cancer ([place])    bladder 2013 removed, has cystoscopy 10/2016, has returned x 2   COPD (chronic obstructive pulmonary disease) ([place])    Coronary artery disease    Artery bypass graft Jan 2008   Dialysis patient [facility])    Dyspnea    Dysrhythmia    GERD (gastroesophageal reflux disease)    Heart murmur    History of hiatal hernia    Hyperlipidemia    Hypertension    Hypothyroidism    PONV (postoperative nausea and vomiting)    Renal disorder    Sleep apnea    Stroke [facility])    Tobacco user     Past Surgical History:  Procedure Laterality Date   APPENDECTOMY     AV FISTULA PLACEMENT Right 02/11/2020   Procedure: RIGHT ARM BRACHIOCEPHALIC ARTERIOVENOUS (AV) FISTULA;  Surgeon: Angelia Mould, MD;  Location: [place];  Service: Vascular;  Laterality: Right;    CARDIAC CATHETERIZATION     COLONOSCOPY WITH PROPOFOL N/A 09/02/2020   Procedure: COLONOSCOPY WITH PROPOFOL;  Surgeon: Rogene Houston, MD;  [facility];  Service: Endoscopy;  Laterality: N/A;   CORONARY ARTERY BYPASS GRAFT  06/14/2006   x 5   CORONARY ARTERY BYPASS GRAFT N/A 09/19/2016   Procedure: REDO CORONARY ARTERY BYPASS GRAFTING (CABG) x two, using right internal mammary artery and left radial artery;  Surgeon: [name], MD;  Location: [place];  Service: Open Heart Surgery;  Laterality: N/A;   FOOT SURGERY     HEMOSTASIS CLIP PLACEMENT  09/02/2020   Procedure: HEMOSTASIS CLIP PLACEMENT;  Surgeon: Rogene Houston, MD;  [facility];  Service: Endoscopy;;   HERNIA REPAIR     HOT HEMOSTASIS  09/02/2020   Procedure: HOT HEMOSTASIS (ARGON PLASMA COAGULATION/BICAP);  Surgeon: Rogene Houston, MD;  [facility];  Service: Endoscopy;;   LEFT HEART CATH AND CORS/GRAFTS ANGIOGRAPHY N/A 09/14/2016   Procedure: Left Heart Cath and Cors/Grafts Angiography;  Surgeon: Troy Sine, MD;  Location: [place] CV LAB;  Service: Cardiovascular;  Laterality: N/A;   LEFT HEART CATHETERIZATION WITH CORONARY/GRAFT ANGIOGRAM N/A 01/31/2014   Procedure: LEFT HEART CATHETERIZATION WITH Beatrix Fetters;  Surgeon: Sanda Klein, MD;  Location: [place] CATH LAB;  Service: Cardiovascular;  Laterality: N/A;   NOSE SURGERY     PERCUTANEOUS  CORONARY STENT INTERVENTION (PCI-S)  01/31/2014   Procedure: PERCUTANEOUS CORONARY STENT INTERVENTION (PCI-S);  Surgeon: Sanda Klein, MD;  Location: [facility] CATH LAB;  Service: Cardiovascular;;   POLYPECTOMY  09/02/2020   Procedure: POLYPECTOMY;  Surgeon: Rogene Houston, MD;  [facility];  Service: Endoscopy;;   RADIAL ARTERY HARVEST Left 09/19/2016   Procedure: RADIAL ARTERY HARVEST;  Surgeon: [name], MD;  Location: [place];  Service: Open Heart Surgery;  Laterality: Left;   TEE WITHOUT CARDIOVERSION N/A 09/19/2016    Procedure: TRANSESOPHAGEAL ECHOCARDIOGRAM (TEE);  Surgeon: [name], MD;  Location: [place];  Service: Open Heart Surgery;  Laterality: N/A;    Family History  Problem Relation Age of Onset   Heart attack Mother    Heart attack Brother    Colon cancer Neg Hx    Colon polyps Neg Hx     Social History:  reports that he quit smoking about 4 years ago. His smoking use included cigarettes. He started smoking about 62 years ago. He has a 40.00 pack-year smoking history. He has never used smokeless tobacco. He reports that he does not drink alcohol and does not use drugs.  Allergies:  Allergies  Allergen Reactions   Cardizem Cd [Diltiazem Hcl Er Beads] Palpitations    "Makes heart skip"   Cardizem [Diltiazem]     Medications: I have reviewed the patient's current medications. Prior to Admission:  Medications Prior to Admission  Medication Sig Dispense Refill Last Dose   acetaminophen (TYLENOL) 325 MG tablet Take 2 tablets (650 mg total) by mouth every 6 (six) hours as needed for mild pain.      allopurinol (ZYLOPRIM) 100 MG tablet Take 100 mg by mouth daily.       atorvastatin (LIPITOR) 80 MG tablet Take 80 mg by mouth daily.      carvedilol (COREG) 3.125 MG tablet Take 1 tablet (3.125 mg total) by mouth 2 (two) times daily. 60 tablet 3    clopidogrel (PLAVIX) 75 MG tablet Take 75 mg by mouth daily.      epoetin alfa (EPOGEN) 20000 UNIT/ML injection Inject 20,000 Units into the skin every Monday. (Patient not taking: No sig reported)      finasteride (PROSCAR) 5 MG tablet Take 5 mg by mouth daily.      folic acid (FOLVITE) 1 MG tablet Take 1 mg by mouth daily.      furosemide (LASIX) 80 MG tablet Take 1 tablet (80 mg total) by mouth every Tuesday, Thursday, Saturday, and Sunday.      hydrALAZINE (APRESOLINE) 100 MG tablet Take 1 tablet (100 mg total) by mouth 3 (three) times daily. 270 tablet 9    isosorbide mononitrate (IMDUR) 120 MG 24 hr tablet Take 1 tablet (120 mg total) by  mouth daily. (Patient taking differently: Take 240 mg by mouth daily.) 30 tablet 5    levothyroxine (SYNTHROID, LEVOTHROID) 50 MCG tablet Take 50 mcg by mouth daily before breakfast.      magnesium oxide (MAG-OX) 400 MG tablet Take 1 tablet (400 mg total) by mouth daily. 30 tablet 2    nitroGLYCERIN (NITROSTAT) 0.4 MG SL tablet Place 0.4 mg under the tongue every 5 (five) minutes as needed for chest pain (max 3 doses).      Omega-3 Fatty Acids (RA FISH OIL) 1000 MG CAPS Take 2,000 mg by mouth 2 (two) times daily.      omeprazole (PRILOSEC) 20 MG capsule Take 20 mg by mouth 2 (two) times  daily.      tamsulosin (FLOMAX) 0.4 MG CAPS capsule Take 0.4 mg by mouth daily.      Vitamin D, Cholecalciferol, 25 MCG (1000 UT) CAPS Take 1,000 Units by mouth daily.       Results for orders placed or performed during the hospital encounter of 04/05/21 (from the past 48 hour(s))  Basic metabolic panel     Status: Abnormal   Collection Time: 04/05/21  2:46 PM  Result Value Ref Range   Sodium 137 135 - 145 mmol/L   Potassium 3.8 3.5 - 5.1 mmol/L   Chloride 92 (L) 98 - 111 mmol/L   CO2 30 22 - 32 mmol/L   Glucose, Bld 95 70 - 99 mg/dL    Comment: Glucose reference range applies only to samples taken after fasting for at least 8 hours.   BUN 29 (H) 8 - 23 mg/dL   Creatinine, Ser 4.63 (H) 0.61 - 1.24 mg/dL   Calcium 8.8 (L) 8.9 - 10.3 mg/dL   GFR, Estimated 13 (L) >60 mL/min    Comment: (NOTE) Calculated using the CKD-EPI Creatinine Equation (2021)    Anion gap 15 5 - 15    Comment: Performed at [facility] [address], [place], [place] [address]  CBC     Status: Abnormal   Collection Time: 04/05/21  2:46 PM  Result Value Ref Range   WBC 4.1 4.0 - 10.5 K/uL   RBC 3.77 (L) 4.22 - 5.81 MIL/uL   Hemoglobin 12.1 (L) 13.0 - 17.0 g/dL   HCT 37.4 (L) 39.0 - 52.0 %   MCV 99.2 80.0 - 100.0 fL   MCH 32.1 26.0 - 34.0 pg   MCHC 32.4 30.0 - 36.0 g/dL   RDW 16.0 (H) 11.5 - 15.5 %   Platelets 179 150 - 400  K/uL   nRBC 0.0 0.0 - 0.2 %    Comment: Performed at [facility], [address], [place], [place] [address]  Troponin I (High Sensitivity)     Status: Abnormal   Collection Time: 04/05/21  2:46 PM  Result Value Ref Range   Troponin I (High Sensitivity) 25 (H) <18 ng/L    Comment: (NOTE) Elevated high sensitivity troponin I (hsTnI) values and significant  changes across serial measurements may suggest ACS but many other  chronic and acute conditions are known to elevate hsTnI results.  Refer to the "Links" section for chest pain algorithms and additional  guidance. Performed at [facility], [address] [place], [place] [address]   Hepatic function panel     Status: None   Collection Time: 04/05/21  2:46 PM  Result Value Ref Range   Total Protein 7.8 6.5 - 8.1 g/dL   Albumin 4.0 3.5 - 5.0 g/dL   AST 20 15 - 41 U/L   ALT 12 0 - 44 U/L   Alkaline Phosphatase 81 38 - 126 U/L   Total Bilirubin 0.6 0.3 - 1.2 mg/dL   Bilirubin, Direct 0.1 0.0 - 0.2 mg/dL   Indirect Bilirubin 0.5 0.3 - 0.9 mg/dL    Comment: Performed at [facility], [address] [place], [place] [address]  Lipase, blood     Status: Abnormal   Collection Time: 04/05/21  2:46 PM  Result Value Ref Range   Lipase 56 (H) 11 - 51 U/L    Comment: Performed at [facility], [address] [place], [place] [address]  Troponin I (High Sensitivity)     Status: Abnormal   Collection  Time: 04/05/21  4:43 PM  Result Value Ref Range   Troponin I (High Sensitivity) 29 (H) <18 ng/L    Comment: (NOTE) Elevated high sensitivity troponin I (hsTnI) values and significant  changes across serial measurements may suggest ACS but many other  chronic and acute conditions are known to elevate hsTnI results.  Refer to the "Links" section for chest pain algorithms and additional  guidance. Performed at [facility], [address] [place], [place] [address]   Comprehensive metabolic panel     Status: Abnormal   Collection Time:  04/06/21 12:23 AM  Result Value Ref Range   Sodium 134 (L) 135 - 145 mmol/L   Potassium 3.8 3.5 - 5.1 mmol/L   Chloride 92 (L) 98 - 111 mmol/L   CO2 26 22 - 32 mmol/L   Glucose, Bld 126 (H) 70 - 99 mg/dL    Comment: Glucose reference range applies only to samples taken after fasting for at least 8 hours.   BUN 37 (H) 8 - 23 mg/dL   Creatinine, Ser 5.18 (H) 0.61 - 1.24 mg/dL   Calcium 9.1 8.9 - 10.3 mg/dL   Total Protein 8.7 (H) 6.5 - 8.1 g/dL   Albumin 4.4 3.5 - 5.0 g/dL   AST 24 15 - 41 U/L   ALT 13 0 - 44 U/L   Alkaline Phosphatase 95 38 - 126 U/L   Total Bilirubin 0.8 0.3 - 1.2 mg/dL   GFR, Estimated 11 (L) >60 mL/min    Comment: (NOTE) Calculated using the CKD-EPI Creatinine Equation (2021)    Anion gap 16 (H) 5 - 15    Comment: Performed at [facility], [address], [place], [place] [address]  CBC     Status: Abnormal   Collection Time: 04/06/21 12:23 AM  Result Value Ref Range   WBC 6.5 4.0 - 10.5 K/uL   RBC 4.34 4.22 - 5.81 MIL/uL   Hemoglobin 13.7 13.0 - 17.0 g/dL   HCT 42.6 39.0 - 52.0 %   MCV 98.2 80.0 - 100.0 fL   MCH 31.6 26.0 - 34.0 pg   MCHC 32.2 30.0 - 36.0 g/dL   RDW 15.9 (H) 11.5 - 15.5 %   Platelets 162 150 - 400 K/uL   nRBC 0.0 0.0 - 0.2 %    Comment: Performed at [facility], [address] [place], [place] [address]  APTT     Status: None   Collection Time: 04/06/21 12:23 AM  Result Value Ref Range   aPTT 34 24 - 36 seconds    Comment: Performed at [facility], [address], [place], [place] [address]  Magnesium     Status: None   Collection Time: 04/06/21 12:23 AM  Result Value Ref Range   Magnesium 1.7 1.7 - 2.4 mg/dL    Comment: Performed at [facility], [address], [place], [place] [address]  Phosphorus     Status: Abnormal   Collection Time: 04/06/21 12:23 AM  Result Value Ref Range   Phosphorus 6.4 (H) 2.5 - 4.6 mg/dL    Comment: Performed at [facility], [address], [place],  [address]  Troponin I (High Sensitivity)      Status: Abnormal   Collection Time: 04/06/21 12:23 AM  Result Value Ref Range   Troponin I (High Sensitivity) 89 (H) <18 ng/L    Comment: DELTA CHECK NOTED RESULT CALLED TO, READ BACK BY AND VERIFIED WITH: STRADER,S@[id number]  BY MATTHEWS, B 11.22.22 (NOTE) Elevated high sensitivity troponin I (hsTnI)  values and significant  changes across serial measurements may suggest ACS but many other  chronic and acute conditions are known to elevate hsTnI results.  Refer to the Links section for chest pain algorithms and additional  guidance. Performed at [facility], [address], [place], [place] [address]   Troponin I (High Sensitivity)     Status: Abnormal   Collection Time: 04/06/21  2:37 AM  Result Value Ref Range   Troponin I (High Sensitivity) 150 (HH) <18 ng/L    Comment: CRITICAL RESULT CALLED TO, READ BACK BY AND VERIFIED WITH: caraballo,m@[id number]  by matthews, b 11.22.22 DELTA CHECK NOTED (NOTE) Elevated high sensitivity troponin I (hsTnI) values and significant  changes across serial measurements may suggest ACS but many other  chronic and acute conditions are known to elevate hsTnI results.  Refer to the Links section for chest pain algorithms and additional  guidance. Performed at [facility], [address] [place], [place] [address]   Troponin I (High Sensitivity)     Status: Abnormal   Collection Time: 04/06/21  6:14 AM  Result Value Ref Range   Troponin I (High Sensitivity) 328 (HH) <18 ng/L    Comment: CRITICAL RESULT CALLED TO, READ BACK BY AND VERIFIED WITH: ALSTON,C AT 7:50AM ON 04/06/21 BY FESTERMAN,C (NOTE) Elevated high sensitivity troponin I (hsTnI) values and significant  changes across serial measurements may suggest ACS but many other  chronic and acute conditions are known to elevate hsTnI results.  Refer to the Links section for chest pain algorithms and additional  guidance. Performed at [facility], [address] [place], [place] [address]   Troponin  I (High Sensitivity)     Status: Abnormal   Collection Time: 04/06/21  8:09 AM  Result Value Ref Range   Troponin I (High Sensitivity) 653 (HH) <18 ng/L    Comment: CRITICAL RESULT CALLED TO, READ BACK BY AND VERIFIED WITH: REYNOLDS,C AT 9:05AM ON 04/06/21 BY FESTERMAN,C (NOTE) Elevated high sensitivity troponin I (hsTnI) values and significant  changes across serial measurements may suggest ACS but many other  chronic and acute conditions are known to elevate hsTnI results.  Refer to the Links section for chest pain algorithms and additional  guidance. Performed at [facility], [address], [place], [place] [address]     CT ABDOMEN PELVIS WO CONTRAST  Result Date: 04/05/2021 CLINICAL DATA:  Possible abscess/ infection Nausea/ vomitting EXAM: CT ABDOMEN AND PELVIS WITHOUT CONTRAST TECHNIQUE: Multidetector CT imaging of the abdomen and pelvis was performed following the standard protocol without IV contrast. COMPARISON:  None. FINDINGS: Lower chest: No acute abnormality. Aortic valve leaflet calcifications. Coronary artery calcifications. Sternotomy wires noted. Hepatobiliary: No focal liver abnormality. Calcified gallstone noted within the gallbladder lumen. Question pericholecystic fluid. Slight haziness of the gallbladder wall. No definite gallbladder wall thickening no biliary dilatation. Pancreas: No focal lesion. Normal pancreatic contour. No surrounding inflammatory changes. No main pancreatic ductal dilatation. Spleen: Normal in size without focal abnormality. Adrenals/Urinary Tract: There is a 2.4 cm right adrenal gland nodule with a density of -10 Hounsfield units consistent with nodule adenoma. Bilateral renal atrophy. Multiple fluid density lesions likely represent simple renal cysts with the largest on the right measuring up to 8.3 cm. Subcentimeter densities are too small to characterize. There is a 1.3 cm lesion with a density of 26 Hounsfield units that is indeterminate (2:39) in  the left kidney. Calcifications associated with kidneys appear to be vascular. No nephrolithiasis and no hydronephrosis. No definite contour-deforming renal mass.  No ureterolithiasis or hydroureter. The urinary bladder is unremarkable. Stomach/Bowel: Stomach is within normal limits. No evidence of bowel wall thickening or dilatation. Scattered colonic diverticulosis. The appendix not definitely identified. Vascular/Lymphatic: No abdominal aorta or iliac aneurysm. Severe atherosclerotic plaque of the aorta and its branches. No abdominal, pelvic, or inguinal lymphadenopathy. Reproductive: Prostate is unremarkable. Other: No intraperitoneal free fluid. No intraperitoneal free gas. No organized fluid collection. Musculoskeletal: Status post right repair with. Likely left repair. No definite inguinal hernia repair. No suspicious lytic or blastic osseous lesions. No acute displaced fracture. Multilevel degenerative changes of the spine. IMPRESSION: 1. Cholelithiasis with question pericholecystic fluid and slight haziness of the gallbladder wall. Recommend right upper quadrant ultrasound for a more sensitive evaluation of the gallbladder. 2. Indeterminate 1.3 cm left renal lesion. Recommend nonemergent MRI renal protocol for further evaluation. When the patient is clinically stable and able to follow directions and hold their breath (preferably as an outpatient) further evaluation with dedicated abdominal MRI should be considered. 3. Colonic diverticulosis with no acute diverticulitis. 4. 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 5.  Aortic Atherosclerosis (ICD10-I70.0)-severe. Electronically Signed   By: Iven Finn M.D.   On: 04/05/2021 19:18   DG Chest 2 View  Result Date: 04/05/2021 CLINICAL DATA:  Chest pain for 45 minutes, central chest pain and epigastric pain EXAM: CHEST - 2 VIEW COMPARISON:  08/30/2020 FINDINGS: Frontal and lateral views of the chest demonstrate postsurgical changes from prior median  sternotomy. The cardiac silhouette is unremarkable. No acute airspace disease, effusion, or pneumothorax. No acute bony abnormalities. IMPRESSION: 1. No acute intrathoracic process. Electronically Signed   By: Randa Ngo M.D.   On: 04/05/2021 15:06    ROS:  Pertinent items are noted in HPI.  Blood pressure (!) 185/61, pulse 91, temperature 98.5 F (36.9 C), temperature source Oral, resp. rate 18, height 5\' 9"  (1.753 m), weight 82.4 kg, SpO2 95 %. Physical Exam: Pleasant white male no acute distress Head is normocephalic, atraumatic Lungs clear to auscultation with equal breath sounds bilaterally Heart examination reveals regular rate and rhythm without S3, S4, murmurs Abdomen soft with minimal tenderness in the epigastric region.  No right upper quadrant tenderness is noted.  No rigidity is noted.  CT scan of abdomen and blood work reviewed  Assessment/Plan: Impression: Epigastric abdominal pain possibly secondary to biliary colic.  This seems to have mostly resolved.  It is concerning that the patient has a rising troponin.  This could have explained his epigastric pain.  He is denying any specific chest pain.  Will order ultrasound of the right upper quadrant to further assess the gallbladder.  He does have a history of cholelithiasis.  Patient has been started on a heparin drip.  No need for acute surgical invention at this time.  We will follow with you.  Aviva Signs 04/06/2021, 9:12 AM

## 2021-04-06 NOTE — Progress Notes (Signed)
Date and time results received: 04/06/21 0904  (use smartphrase ".now" to insert current time)  Test: Tropinin Critical Value: 653  Name of Provider Notified: Memon  Orders Received? Or Actions Taken?: Awaiting response

## 2021-04-06 NOTE — Progress Notes (Signed)
PROGRESS NOTE    [name]  PPJ:[id number] DOB: 1945-12-13 DOA: 04/05/2021 PCP: [facility]    Brief Narrative:  75 year old male with a history of end-stage renal disease on hemodialysis, hypertension, coronary artery disease, CHF, BPH, presents to the hospital with abdominal pain.  Reports epigastric pain with slight radiation to the right and was associated with nausea and vomiting.  Initial concern was for biliary source, but imaging was not convincing for cholecystitis.  Clinically, overall he has improved and is now tolerating a diet without pain or vomiting.  Suspect that he may have had biliary colic which is passed.  Was also noted to have elevated troponins.  Seen by cardiology with decision to treat conservatively.  He is on medical management.  Nephrology following for dialysis needs.   Assessment & Plan:   Principal Problem:   Abdominal pain Active Problems:   HYPERLIPIDEMIA, MIXED   Essential hypertension   Renal lesion   (HFpEF) heart failure with preserved ejection fraction (HCC)   BPH (benign prostatic hyperplasia)   Coronary artery disease involving autologous artery coronary bypass graft   Elevated troponin   End-stage renal disease on hemodialysis (HCC)   Cholelithiasis   Hypertensive urgency   Adenoma of right adrenal gland   Elevated lipase   Hypothyroidism (acquired)   GERD (gastroesophageal reflux disease)   History of COPD   Abdominal pain -Possibly secondary to biliary colic -Clinically, appears to have resolved -Ultrasound right upper quadrant was equivocal -Patient is tolerating diet at this time without worsening pain or vomiting -LFTs unremarkable -Seen by general surgery, no indication for surgery at this time -Can be followed as needed as an outpatient  Elevated troponin related to NSTEMI -Troponin up to 653 -Started on heparin infusion -Cardiology following -Patient with difficult coronary anatomy status post CABG.  Most  recent cardiac cath in 2019 did not show any amenable targets for intervention -Patient is also had difficulty coming to [facility] due to issues with GI bleeding -Echocardiogram shows normal ejection fraction with no wall motion abnormalities -Recommendations were to continue with heparin infusion x48 hours -Continue statin, Coreg, Plavix  End-stage renal disease on hemodialysis -Nephrology following -Dialysis planned for 11/23  Hypertension -Currently on Coreg, Imdur, as needed hydralazine -Blood pressure currently stable -Continue to use IV hydralazine as needed  Incidental finding of left renal lesion -Can consider nonemergent MRI with renal protocol for further outpatient work-up  Right adrenal adenoma -Unchanged from prior imaging  Hyperlipidemia -Continue statin  Hypothyroidism -Continue on Synthroid  BPH -Continue Proscar and Flomax   DVT prophylaxis: SCDs Start: 04/05/21 2325 heparin infusion  Code Status: full code Family Communication: discussed with patient Disposition Plan: Status is: Inpatient  Remains inpatient appropriate because: continued work up for elevated troponin, currently on IV heparin         Consultants:  Cardiology Nephrology General surgery  Procedures:  Echo  Antimicrobials:      Subjective: No longer having any abdominal pain, no vomiting, no shortness of breath  Objective: Vitals:   04/06/21 0336 04/06/21 0500 04/06/21 1405 04/06/21 2045  BP: (!) 185/61  101/67 120/68  Pulse: 91  (!) 106 100  Resp:   18 18  Temp: 98.5 F (36.9 C)  98 F (36.7 C) 98.1 F (36.7 C)  TempSrc: Oral     SpO2: 95%  97% 98%  Weight:  82.4 kg    Height:        Intake/Output Summary (Last 24 hours) at 04/06/2021 2219  Last data filed at 04/06/2021 2000 Gross per 24 hour  Intake 1359.5 ml  Output 200 ml  Net 1159.5 ml   Filed Weights   04/05/21 1425 04/06/21 0500  Weight: 83 kg 82.4 kg    Examination:  General exam: Appears calm  and comfortable  Respiratory system: Clear to auscultation. Respiratory effort normal. Cardiovascular system: S1 & S2 heard, RRR. No JVD, murmurs, rubs, gallops or clicks. No pedal edema. Gastrointestinal system: Abdomen is nondistended, soft and nontender. No organomegaly or masses felt. Normal bowel sounds heard. Central nervous system: Alert and oriented. No focal neurological deficits. Extremities: Symmetric 5 x 5 power. Skin: No rashes, lesions or ulcers Psychiatry: Judgement and insight appear normal. Mood & affect appropriate.     Data Reviewed: I have personally reviewed following labs and imaging studies  CBC: Recent Labs  Lab 04/05/21 1446 04/06/21 0023  WBC 4.1 6.5  HGB 12.1* 13.7  HCT 37.4* 42.6  MCV 99.2 98.2  PLT 179 696   Basic Metabolic Panel: Recent Labs  Lab 04/05/21 1446 04/06/21 0023  NA 137 134*  K 3.8 3.8  CL 92* 92*  CO2 30 26  GLUCOSE 95 126*  BUN 29* 37*  CREATININE 4.63* 5.18*  CALCIUM 8.8* 9.1  MG  --  1.7  PHOS  --  6.4*   GFR: Estimated Creatinine Clearance: 12.5 mL/min (A) (by C-G formula based on SCr of 5.18 mg/dL (H)). Liver Function Tests: Recent Labs  Lab 04/05/21 1446 04/06/21 0023  AST 20 24  ALT 12 13  ALKPHOS 81 95  BILITOT 0.6 0.8  PROT 7.8 8.7*  ALBUMIN 4.0 4.4   Recent Labs  Lab 04/05/21 1446  LIPASE 56*   No results for input(s): AMMONIA in the last 168 hours. Coagulation Profile: No results for input(s): INR, PROTIME in the last 168 hours. Cardiac Enzymes: No results for input(s): CKTOTAL, CKMB, CKMBINDEX, TROPONINI in the last 168 hours. BNP (last 3 results) No results for input(s): PROBNP in the last 8760 hours. HbA1C: No results for input(s): HGBA1C in the last 72 hours. CBG: No results for input(s): GLUCAP in the last 168 hours. Lipid Profile: No results for input(s): CHOL, HDL, LDLCALC, TRIG, CHOLHDL, LDLDIRECT in the last 72 hours. Thyroid Function Tests: No results for input(s): TSH, T4TOTAL,  FREET4, T3FREE, THYROIDAB in the last 72 hours. Anemia Panel: No results for input(s): VITAMINB12, FOLATE, FERRITIN, TIBC, IRON, RETICCTPCT in the last 72 hours. Sepsis Labs: No results for input(s): PROCALCITON, LATICACIDVEN in the last 168 hours.  No results found for this or any previous visit (from the past 240 hour(s)).       Radiology Studies: CT ABDOMEN PELVIS WO CONTRAST  Result Date: 04/05/2021 CLINICAL DATA:  Possible abscess/ infection Nausea/ vomitting EXAM: CT ABDOMEN AND PELVIS WITHOUT CONTRAST TECHNIQUE: Multidetector CT imaging of the abdomen and pelvis was performed following the standard protocol without IV contrast. COMPARISON:  None. FINDINGS: Lower chest: No acute abnormality. Aortic valve leaflet calcifications. Coronary artery calcifications. Sternotomy wires noted. Hepatobiliary: No focal liver abnormality. Calcified gallstone noted within the gallbladder lumen. Question pericholecystic fluid. Slight haziness of the gallbladder wall. No definite gallbladder wall thickening no biliary dilatation. Pancreas: No focal lesion. Normal pancreatic contour. No surrounding inflammatory changes. No main pancreatic ductal dilatation. Spleen: Normal in size without focal abnormality. Adrenals/Urinary Tract: There is a 2.4 cm right adrenal gland nodule with a density of -10 Hounsfield units consistent with nodule adenoma. Bilateral renal atrophy. Multiple fluid density lesions likely represent  simple renal cysts with the largest on the right measuring up to 8.3 cm. Subcentimeter densities are too small to characterize. There is a 1.3 cm lesion with a density of 26 Hounsfield units that is indeterminate (2:39) in the left kidney. Calcifications associated with kidneys appear to be vascular. No nephrolithiasis and no hydronephrosis. No definite contour-deforming renal mass. No ureterolithiasis or hydroureter. The urinary bladder is unremarkable. Stomach/Bowel: Stomach is within normal limits.  No evidence of bowel wall thickening or dilatation. Scattered colonic diverticulosis. The appendix not definitely identified. Vascular/Lymphatic: No abdominal aorta or iliac aneurysm. Severe atherosclerotic plaque of the aorta and its branches. No abdominal, pelvic, or inguinal lymphadenopathy. Reproductive: Prostate is unremarkable. Other: No intraperitoneal free fluid. No intraperitoneal free gas. No organized fluid collection. Musculoskeletal: Status post right repair with. Likely left repair. No definite inguinal hernia repair. No suspicious lytic or blastic osseous lesions. No acute displaced fracture. Multilevel degenerative changes of the spine. IMPRESSION: 1. Cholelithiasis with question pericholecystic fluid and slight haziness of the gallbladder wall. Recommend right upper quadrant ultrasound for a more sensitive evaluation of the gallbladder. 2. Indeterminate 1.3 cm left renal lesion. Recommend nonemergent MRI renal protocol for further evaluation. When the patient is clinically stable and able to follow directions and hold their breath (preferably as an outpatient) further evaluation with dedicated abdominal MRI should be considered. 3. Colonic diverticulosis with no acute diverticulitis. 4. 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 5.  Aortic Atherosclerosis (ICD10-I70.0)-severe. Electronically Signed   By: Iven Finn M.D.   On: 04/05/2021 19:18   DG Chest 2 View  Result Date: 04/05/2021 CLINICAL DATA:  Chest pain for 45 minutes, central chest pain and epigastric pain EXAM: CHEST - 2 VIEW COMPARISON:  08/30/2020 FINDINGS: Frontal and lateral views of the chest demonstrate postsurgical changes from prior median sternotomy. The cardiac silhouette is unremarkable. No acute airspace disease, effusion, or pneumothorax. No acute bony abnormalities. IMPRESSION: 1. No acute intrathoracic process. Electronically Signed   By: Randa Ngo M.D.   On: 04/05/2021 15:06   ECHOCARDIOGRAM COMPLETE  Result  Date: 04/06/2021    ECHOCARDIOGRAM REPORT   Patient Name:   [name] Date of Exam: 04/06/2021 Medical Rec #:  [id number]      Height:       69.0 in Accession #:    [id number]     Weight:       181.7 lb Date of Birth:  09/13/1945      BSA:          1.983 m Patient Age:    30 years       BP:           185/61 mmHg Patient Gender: M              HR:           91 bpm. Exam Location:  [facility] Procedure: 2D Echo, Cardiac Doppler and Color Doppler Indications:    Chest Pain  History:        Patient has prior history of Echocardiogram examinations, most                 recent 05/09/2020. CHF, Previous Myocardial Infarction and CAD,                 Prior Cardiac Surgery and Prior CABG, COPD, Signs/Symptoms:Chest                 Pain; Risk Factors:Hypertension, Former Smoker and Dyslipidemia.  Sonographer:  Wenda Low Referring Phys: [id number] Anthony  1. Left ventricular ejection fraction, by estimation, is 55 to 60%. The left ventricle has normal function. The left ventricle has no regional wall motion abnormalities. There is severe left ventricular hypertrophy. Left ventricular diastolic parameters  are indeterminate.  2. Right ventricular systolic function is normal. The right ventricular size is normal. There is normal pulmonary artery systolic pressure.  3. The mitral valve is normal in structure. No evidence of mitral valve regurgitation. No evidence of mitral stenosis.  4. The aortic valve has an indeterminant number of cusps. There is severe calcifcation of the aortic valve. There is severe thickening of the aortic valve. Aortic valve regurgitation is mild. Mild to moderate aortic valve stenosis.  5. The inferior vena cava is normal in size with greater than 50% respiratory variability, suggesting right atrial pressure of 3 mmHg. FINDINGS  Left Ventricle: Left ventricular ejection fraction, by estimation, is 55 to 60%. The left ventricle has normal function. The left ventricle has  no regional wall motion abnormalities. The left ventricular internal cavity size was normal in size. There is  severe left ventricular hypertrophy. Left ventricular diastolic parameters are indeterminate. Right Ventricle: The right ventricular size is normal. No increase in right ventricular wall thickness. Right ventricular systolic function is normal. There is normal pulmonary artery systolic pressure. The tricuspid regurgitant velocity is 1.70 m/s, and  with an assumed right atrial pressure of 3 mmHg, the estimated right ventricular systolic pressure is 45.4 mmHg. Left Atrium: Left atrial size was normal in size. Right Atrium: Right atrial size was normal in size. Pericardium: There is no evidence of pericardial effusion. Mitral Valve: The mitral valve is normal in structure. No evidence of mitral valve regurgitation. No evidence of mitral valve stenosis. MV peak gradient, 5.0 mmHg. The mean mitral valve gradient is 2.0 mmHg. Tricuspid Valve: The tricuspid valve is normal in structure. Tricuspid valve regurgitation is not demonstrated. No evidence of tricuspid stenosis. Aortic Valve: The aortic valve has an indeterminant number of cusps. There is severe calcifcation of the aortic valve. There is severe thickening of the aortic valve. There is severe aortic valve annular calcification. Aortic valve regurgitation is mild.  Aortic regurgitation PHT measures 376 msec. Mild to moderate aortic stenosis is present. Aortic valve mean gradient measures 17.2 mmHg. Aortic valve peak gradient measures 30.7 mmHg. Aortic valve area, by VTI measures 1.20 cm. Pulmonic Valve: The pulmonic valve was not well visualized. Pulmonic valve regurgitation is not visualized. No evidence of pulmonic stenosis. Aorta: The aortic root is normal in size and structure. Venous: The inferior vena cava is normal in size with greater than 50% respiratory variability, suggesting right atrial pressure of 3 mmHg. IAS/Shunts: No atrial level shunt  detected by color flow Doppler.  LEFT VENTRICLE PLAX 2D LVIDd:         3.70 cm     Diastology LVIDs:         2.75 cm     LV e' medial:    5.77 cm/s LV PW:         1.50 cm     LV E/e' medial:  14.9 LV IVS:        1.50 cm     LV e' lateral:   8.38 cm/s LVOT diam:     2.00 cm     LV E/e' lateral: 10.3 LV SV:         61 LV SV Index:   31 LVOT Area:  3.14 cm  LV Volumes (MOD) LV vol d, MOD A2C: 66.1 ml LV vol d, MOD A4C: 54.4 ml LV vol s, MOD A2C: 34.6 ml LV vol s, MOD A4C: 26.9 ml LV SV MOD A2C:     31.5 ml LV SV MOD A4C:     54.4 ml LV SV MOD BP:      28.7 ml RIGHT VENTRICLE RV Basal diam:  3.30 cm RV Mid diam:    2.30 cm LEFT ATRIUM             Index        RIGHT ATRIUM           Index LA diam:        3.50 cm 1.76 cm/m   RA Area:     18.30 cm LA Vol (A2C):   76.8 ml 38.72 ml/m  RA Volume:   45.90 ml  23.14 ml/m LA Vol (A4C):   66.0 ml 33.28 ml/m LA Biplane Vol: 72.1 ml 36.35 ml/m  AORTIC VALVE                     PULMONIC VALVE AV Area (Vmax):    1.04 cm      PV Vmax:       1.08 m/s AV Area (Vmean):   1.12 cm      PV Peak grad:  4.7 mmHg AV Area (VTI):     1.20 cm AV Vmax:           277.00 cm/s AV Vmean:          193.000 cm/s AV VTI:            0.507 m AV Peak Grad:      30.7 mmHg AV Mean Grad:      17.2 mmHg LVOT Vmax:         92.00 cm/s LVOT Vmean:        69.000 cm/s LVOT VTI:          0.193 m LVOT/AV VTI ratio: 0.38 AI PHT:            376 msec  AORTA Ao Root diam: 3.30 cm Ao Asc diam:  3.10 cm MITRAL VALVE                TRICUSPID VALVE MV Area (PHT): 4.06 cm     TR Peak grad:   11.6 mmHg MV Area VTI:   2.77 cm     TR Vmax:        170.00 cm/s MV Peak grad:  5.0 mmHg MV Mean grad:  2.0 mmHg     SHUNTS MV Vmax:       1.12 m/s     Systemic VTI:  0.19 m MV Vmean:      59.3 cm/s    Systemic Diam: 2.00 cm MV Decel Time: 187 msec MV E velocity: 86.10 cm/s MV A velocity: 104.00 cm/s MV E/A ratio:  0.83 Carlyle Dolly MD Electronically signed by Carlyle Dolly MD Signature Date/Time: 04/06/2021/4:28:18 PM     Final    US Abdomen Limited RUQ (LIVER/GB)  Result Date: 04/06/2021 CLINICAL DATA:  75 year old male with a history of right upper quadrant pain EXAM: ULTRASOUND ABDOMEN LIMITED RIGHT UPPER QUADRANT COMPARISON:  CT 04/05/2021, ultrasound 02/07/2020 FINDINGS: Gallbladder: Gallbladder distended. The thickness of the near field wall measures 3 mm. Echogenic debris/stones at the gallbladder fundus, compatible with prior CT. Sonographic Murphy sign labeled as negative. Common bile duct: Diameter: Common bile  duct measures 5 mm. Liver: No focal lesion identified. Within normal limits in parenchymal echogenicity. Portal vein is patent on color Doppler imaging with normal direction of blood flow towards the liver. Other: Incidental imaging of renal cysts which were present on the comparison CT. IMPRESSION: Ultrasound is equivocal for acute cholecystitis, as there is significant gallbladder distension, evidence of cholelithiasis, however, sonographic Murphy's sign is negative. Electronically Signed   By: Corrie Mckusick D.O.   On: 04/06/2021 10:51        Scheduled Meds:  alum & mag hydroxide-simeth  30 mL Oral Once   And   lidocaine  15 mL Oral Once   atorvastatin  80 mg Oral q1800   carvedilol  3.125 mg Oral BID   Chlorhexidine Gluconate Cloth  6 each Topical Q0600   cholecalciferol  1,000 Units Oral Daily   clopidogrel  75 mg Oral Daily   finasteride  5 mg Oral Daily   furosemide  80 mg Oral Q T,Th,S,Su   isosorbide mononitrate  120 mg Oral Daily   levothyroxine  50 mcg Oral QAC breakfast   omega-3 acid ethyl esters  1 g Oral BID   pantoprazole (PROTONIX) IV  40 mg Intravenous Q12H   tamsulosin  0.4 mg Oral Daily   Continuous Infusions:  heparin 1,300 Units/hr (04/06/21 2020)     LOS: 1 day    Time spent: 7mins    Kathie Dike, MD [facility]   If 7PM-7AM, please contact night-coverage [email or web address]  04/06/2021, 10:19 PM

## 2021-04-06 NOTE — Progress Notes (Signed)
ANTICOAGULATION CONSULT NOTE   Pharmacy Consult for Heparin Indication: chest pain/ACS  Allergies  Allergen Reactions   Cardizem Cd [Diltiazem Hcl Er Beads] Palpitations    "Makes heart skip"   Cardizem [Diltiazem]     Patient Measurements: Height: 5\' 9"  (175.3 cm) Weight: 82.4 kg (181 lb 10.5 oz) IBW/kg (Calculated) : 70.7 HEPARIN DW (KG): 83   Vital Signs: Temp: 98.1 F (36.7 C) (11/22 2045) BP: 120/68 (11/22 2045) Pulse Rate: 100 (11/22 2045)  Labs: Recent Labs    04/05/21 1446 04/05/21 1643 04/06/21 0023 04/06/21 0237 04/06/21 0614 04/06/21 0809 04/06/21 1138 04/06/21 2155  HGB 12.1*  --  13.7  --   --   --   --   --   HCT 37.4*  --  42.6  --   --   --   --   --   PLT 179  --  162  --   --   --   --   --   APTT  --   --  34  --   --   --   --   --   HEPARINUNFRC  --   --   --   --   --   --  0.17* 0.33  CREATININE 4.63*  --  5.18*  --   --   --   --   --   TROPONINIHS 25*   < > 89* 150* 328* 653*  --   --    < > = values in this interval not displayed.     Estimated Creatinine Clearance: 12.5 mL/min (A) (by C-G formula based on SCr of 5.18 mg/dL (H)).   Medical History: Past Medical History:  Diagnosis Date   Acute myocardial infarction of other inferior wall, initial episode of care    Anemia    Anginal pain ([place])    Anxiety    Arthritis    Cancer ([place])    bladder 2013 removed, has cystoscopy 10/2016, has returned x 2   COPD (chronic obstructive pulmonary disease) ([place])    Coronary artery disease    Artery bypass graft Jan 2008   Dialysis patient [facility])    Dyspnea    Dysrhythmia    GERD (gastroesophageal reflux disease)    Heart murmur    History of hiatal hernia    Hyperlipidemia    Hypertension    Hypothyroidism    PONV (postoperative nausea and vomiting)    Renal disorder    Sleep apnea    Stroke ([place])    Tobacco user     Medications:  No current facility-administered medications on file prior to encounter.   Current Outpatient  Medications on File Prior to Encounter  Medication Sig Dispense Refill   acetaminophen (TYLENOL) 325 MG tablet Take 2 tablets (650 mg total) by mouth every 6 (six) hours as needed for mild pain.     allopurinol (ZYLOPRIM) 100 MG tablet Take 100 mg by mouth daily.      atorvastatin (LIPITOR) 80 MG tablet Take 80 mg by mouth daily.     carvedilol (COREG) 3.125 MG tablet Take 1 tablet (3.125 mg total) by mouth 2 (two) times daily. 60 tablet 3   clopidogrel (PLAVIX) 75 MG tablet Take 75 mg by mouth daily.     finasteride (PROSCAR) 5 MG tablet Take 5 mg by mouth daily.     folic acid (FOLVITE) 1 MG tablet Take 1 mg by mouth daily.     furosemide (LASIX)  80 MG tablet Take 1 tablet (80 mg total) by mouth every Tuesday, Thursday, Saturday, and Sunday.     isosorbide mononitrate (IMDUR) 120 MG 24 hr tablet Take 1 tablet (120 mg total) by mouth daily. (Patient taking differently: Take 240 mg by mouth daily.) 30 tablet 5   levothyroxine (SYNTHROID, LEVOTHROID) 50 MCG tablet Take 50 mcg by mouth daily before breakfast.     nitroGLYCERIN (NITROSTAT) 0.4 MG SL tablet Place 0.4 mg under the tongue every 5 (five) minutes as needed for chest pain (max 3 doses).     Omega-3 Fatty Acids (RA FISH OIL) 1000 MG CAPS Take 2,000 mg by mouth 2 (two) times daily.     omeprazole (PRILOSEC) 20 MG capsule Take 20 mg by mouth 2 (two) times daily.     tamsulosin (FLOMAX) 0.4 MG CAPS capsule Take 0.4 mg by mouth daily.     Vitamin D, Cholecalciferol, 25 MCG (1000 UT) CAPS Take 1,000 Units by mouth daily.     epoetin alfa (EPOGEN) 20000 UNIT/ML injection Inject 20,000 Units into the skin every Monday. (Patient not taking: Reported on 04/06/2021)     hydrALAZINE (APRESOLINE) 100 MG tablet Take 1 tablet (100 mg total) by mouth 3 (three) times daily. (Patient not taking: Reported on 04/06/2021) 270 tablet 9   magnesium oxide (MAG-OX) 400 MG tablet Take 1 tablet (400 mg total) by mouth daily. (Patient not taking: Reported on  04/06/2021) 30 tablet 2     Assessment: 75 y.o. male admitted with RUQ/epigastric pain which lasted for 12+ hours and experienced associated nausea and vomiting with this.. He has elevated troponin, possible ACS, for heparin  11/22 PM update:  Heparin level therapeutic after rate increase  Goal of Therapy:  Heparin level 0.3-0.7 units/ml Monitor platelets by anticoagulation protocol: Yes   Plan:  Cont heparin at 1300 units/hr Heparin level with AM labs  Narda Bonds, PharmD, [place] Pharmacist Phone: [phone]

## 2021-04-06 NOTE — Consult Note (Addendum)
Cardiology Consultation:   Patient ID: [name] MRN: [id number]; DOB: October 19, 1945  Admit date: 04/05/2021 Date of Consult: 04/06/2021  PCP:  Clinic, [place] Providers Cardiologist:  Carlyle Dolly, MD (at [facility]); Now followed by [facility]  Patient Profile:   [name] is a 75 y.o. 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 Josephine Cables.  History of Present Illness:   [name] was last examined by Dr. Harl Bowie over 2 years ago in 02/2019 and had recently been admitted to the [facility] for an MI and medically managed at that time given his known disease and poor targets by prior catheterization. He denied any recurrent chest pain at the time of his office visit and was continued on Plavix alone as opposed to DAPT given prior GI bleeding. Was informed to follow-up in 6 weeks has not been evaluated by [facility] since. Followed by [facility] in the interim.   He presented to [facility] ED on 04/05/2021 for evaluation of epigastric pain. In talking with the patient today, he reports being in his usual state of health until yesterday when he developed sudden epigastric pain.  Says he went to dialysis and consumed a sausage and egg biscuit afterwards. While driving in his car, he developed a significant burning sensation along his epigastric region with a sharp pain which radiated into his right upper quadrant. Says the pain was worse than any type of pain he has  experienced with prior surgeries and did not resemble his prior cardiac pain. He reports associated nausea and vomiting at that time but no recent dyspnea on exertion, orthopnea, PND or pitting edema.  Reports his BP and volume status have been well controlled with dialysis. Says he no longer takes Hydralazine as his blood pressure was dropping with this during his sessions.  He has been receiving Dilaudid and Toradol and reports his pain resolved around 0500 today. His pain lasted for 12+ hours.  Feels back to baseline at this time but reports he is tender to palpation along his right upper quadrant region.  Initial labs show WBC 4.1, Hgb 12.1, platelets 179, Na+ 137, K+ 3.8 and creatinine 4.63.  AST 20 and ALT 12. Alk phos 81. Lipase 56. Initial Hs Troponin 25 with repeat values of 29, 89, 150, 328 and 653. CXR with no acute findings. CT Abdomen shows cholelithiasis with questionable pericholecystic fluid with right upper quadrant ultrasound recommended. Also noted to have a 1.3 cm left renal lesion with nonemergent MRI or recommended.  EKG on admission shows sinus bradycardia, heart rate 57 with TWI along the lateral leads which is new when compared to prior tracings in 08/2020.    Past Medical History:  Diagnosis Date   Acute myocardial infarction of other inferior wall, initial episode of care    Anemia    Anginal pain ([place])    Anxiety    Arthritis    Cancer ([place])    bladder 2013 removed, has cystoscopy 10/2016, has returned x 2  COPD (chronic obstructive pulmonary disease) (HCC)    Coronary artery disease    Artery bypass graft Jan 2008   Dialysis patient [facility])    Dyspnea    Dysrhythmia    GERD (gastroesophageal reflux disease)    Heart murmur    History of hiatal hernia    Hyperlipidemia    Hypertension    Hypothyroidism    PONV (postoperative nausea and vomiting)    Renal disorder    Sleep apnea    Stroke [facility])    Tobacco user     Past Surgical History:  Procedure  Laterality Date   APPENDECTOMY     AV FISTULA PLACEMENT Right 02/11/2020   Procedure: RIGHT ARM BRACHIOCEPHALIC ARTERIOVENOUS (AV) FISTULA;  Surgeon: Angelia Mould, MD;  Location: [facility] OR;  Service: Vascular;  Laterality: Right;   CARDIAC CATHETERIZATION     COLONOSCOPY WITH PROPOFOL N/A 09/02/2020   Procedure: COLONOSCOPY WITH PROPOFOL;  Surgeon: Rogene Houston, MD;  [facility];  Service: Endoscopy;  Laterality: N/A;   CORONARY ARTERY BYPASS GRAFT  06/14/2006   x 5   CORONARY ARTERY BYPASS GRAFT N/A 09/19/2016   Procedure: REDO CORONARY ARTERY BYPASS GRAFTING (CABG) x two, using right internal mammary artery and left radial artery;  Surgeon: [name], MD;  Location: [place];  Service: Open Heart Surgery;  Laterality: N/A;   FOOT SURGERY     HEMOSTASIS CLIP PLACEMENT  09/02/2020   Procedure: HEMOSTASIS CLIP PLACEMENT;  Surgeon: Rogene Houston, MD;  [facility]: [facility];  Service: Endoscopy;;   HERNIA REPAIR     HOT HEMOSTASIS  09/02/2020   Procedure: HOT HEMOSTASIS (ARGON PLASMA COAGULATION/BICAP);  Surgeon: Rogene Houston, MD;  [facility];  Service: Endoscopy;;   LEFT HEART CATH AND CORS/GRAFTS ANGIOGRAPHY N/A 09/14/2016   Procedure: Left Heart Cath and Cors/Grafts Angiography;  Surgeon: Troy Sine, MD;  Location: [place] CV LAB;  Service: Cardiovascular;  Laterality: N/A;   LEFT HEART CATHETERIZATION WITH CORONARY/GRAFT ANGIOGRAM N/A 01/31/2014   Procedure: LEFT HEART CATHETERIZATION WITH Beatrix Fetters;  Surgeon: Sanda Klein, MD;  Location: [place] CATH LAB;  Service: Cardiovascular;  Laterality: N/A;   NOSE SURGERY     PERCUTANEOUS CORONARY STENT INTERVENTION (PCI-S)  01/31/2014   Procedure: PERCUTANEOUS CORONARY STENT INTERVENTION (PCI-S);  Surgeon: Sanda Klein, MD;  Location: [facility] CATH LAB;  Service: Cardiovascular;;   POLYPECTOMY  09/02/2020   Procedure: POLYPECTOMY;  Surgeon: Rogene Houston, MD;  [facility];  Service:  Endoscopy;;   RADIAL ARTERY HARVEST Left 09/19/2016   Procedure: RADIAL ARTERY HARVEST;  Surgeon: [name], MD;  Location: [place];  Service: Open Heart Surgery;  Laterality: Left;   TEE WITHOUT CARDIOVERSION N/A 09/19/2016   Procedure: TRANSESOPHAGEAL ECHOCARDIOGRAM (TEE);  Surgeon: [name], MD;  Location: [place];  Service: Open Heart Surgery;  Laterality: N/A;     Home Medications:  Prior to Admission medications   Medication Sig Start Date End Date Taking? Authorizing Provider  acetaminophen (TYLENOL) 325 MG tablet Take 2 tablets (650 mg total) by mouth every 6 (six) hours as needed for mild pain. 09/23/16  Yes Lars Pinks M, PA-C  allopurinol (ZYLOPRIM) 100 MG tablet Take 100 mg by mouth daily.  05/03/19  Yes [provider]  atorvastatin (LIPITOR) 80 MG tablet Take 80 mg by mouth daily.   Yes [provider]  carvedilol (COREG) 3.125 MG tablet Take 1 tablet (3.125 mg total) by mouth 2 (  two) times daily. 05/10/20 05/10/21 Yes Debbe Odea, MD  clopidogrel (PLAVIX) 75 MG tablet Take 75 mg by mouth daily.   Yes [provider]  finasteride (PROSCAR) 5 MG tablet Take 5 mg by mouth daily.   Yes [provider]  folic acid (FOLVITE) 1 MG tablet Take 1 mg by mouth daily.   Yes [provider]  furosemide (LASIX) 80 MG tablet Take 1 tablet (80 mg total) by mouth every Tuesday, Thursday, Saturday, and Sunday. 09/05/20  Yes Tat, Shanon Brow, MD  isosorbide mononitrate (IMDUR) 120 MG 24 hr tablet Take 1 tablet (120 mg total) by mouth daily. Patient taking differently: Take 240 mg by mouth daily. 05/18/19  Yes Emokpae, Courage, MD  levothyroxine (SYNTHROID, LEVOTHROID) 50 MCG tablet Take 50 mcg by mouth daily before breakfast.   Yes [provider]  nitroGLYCERIN (NITROSTAT) 0.4 MG SL tablet Place 0.4 mg under the tongue every 5 (five) minutes as needed for chest pain (max 3 doses).   Yes [provider]  Omega-3 Fatty Acids (RA  FISH OIL) 1000 MG CAPS Take 2,000 mg by mouth 2 (two) times daily.   Yes [provider]  omeprazole (PRILOSEC) 20 MG capsule Take 20 mg by mouth 2 (two) times daily. 02/14/20  Yes [provider]  tamsulosin (FLOMAX) 0.4 MG CAPS capsule Take 0.4 mg by mouth daily.   Yes [provider]  Vitamin D, Cholecalciferol, 25 MCG (1000 UT) CAPS Take 1,000 Units by mouth daily.   Yes [provider]  epoetin alfa (EPOGEN) 20000 UNIT/ML injection Inject 20,000 Units into the skin every Monday. Patient not taking: Reported on 04/06/2021    [provider]  hydrALAZINE (APRESOLINE) 100 MG tablet Take 1 tablet (100 mg total) by mouth 3 (three) times daily. Patient not taking: Reported on 04/06/2021 05/18/19   Roxan Hockey, MD  magnesium oxide (MAG-OX) 400 MG tablet Take 1 tablet (400 mg total) by mouth daily. Patient not taking: Reported on 04/06/2021 08/11/20   Roxan Hockey, MD    Inpatient Medications: Scheduled Meds:  alum & mag hydroxide-simeth  30 mL Oral Once   And   lidocaine  15 mL Oral Once   atorvastatin  80 mg Oral q1800   carvedilol  3.125 mg Oral BID   cholecalciferol  1,000 Units Oral Daily   clopidogrel  75 mg Oral Daily   finasteride  5 mg Oral Daily   furosemide  80 mg Oral Q T,Th,S,Su   hydrALAZINE  100 mg Oral TID   isosorbide mononitrate  120 mg Oral Daily   levothyroxine  50 mcg Oral QAC breakfast   omega-3 acid ethyl esters  1 g Oral BID   pantoprazole (PROTONIX) IV  40 mg Intravenous Q12H   tamsulosin  0.4 mg Oral Daily   Continuous Infusions:  heparin 1,000 Units/hr (04/06/21 0444)   PRN Meds: hydrALAZINE, HYDROmorphone (DILAUDID) injection, nitroGLYCERIN, ondansetron (ZOFRAN) IV, prochlorperazine  Allergies:    Allergies  Allergen Reactions   Cardizem Cd [Diltiazem Hcl Er Beads] Palpitations    "Makes heart skip"   Cardizem [Diltiazem]     Social History:   Social History   Socioeconomic History   Marital  status: Married    Spouse name: Not on file   Number of children: Not on file   Years of education: Not on file   Highest education level: Not on file  Occupational History   Not on file  Tobacco Use   Smoking status: Former  Packs/day: 1.00    Years: 40.00    Pack years: 40.00    Types: Cigarettes    Start date: 02/20/1959    Quit date: 09/13/2016    Years since quitting: 4.5   Smokeless tobacco: Never  Vaping Use   Vaping Use: Never used  Substance and Sexual Activity   Alcohol use: No    Alcohol/week: 0.0 standard drinks    Comment: "No, not really"   Drug use: No   Sexual activity: Not Currently  Other Topics Concern   Not on file  Social History Narrative   Not on file   Social Determinants of Health   Financial Resource Strain: Not on file  Food Insecurity: Not on file  Transportation Needs: Not on file  Physical Activity: Not on file  Stress: Not on file  Social Connections: Not on file  Intimate Partner Violence: Not on file    Family History:    Family History  Problem Relation Age of Onset   Heart attack Mother    Heart attack Brother    Colon cancer Neg Hx    Colon polyps Neg Hx      ROS:  Please see the history of present illness.   All other ROS reviewed and negative.     Physical Exam/Data:   Vitals:   04/05/21 2200 04/05/21 2332 04/06/21 0336 04/06/21 0500  BP: (!) 198/49 (!) 170/94 (!) 185/61   Pulse: 70 74 91   Resp: 20 18    Temp:  (!) 97.5 F (36.4 C) 98.5 F (36.9 C)   TempSrc:  Oral Oral   SpO2: 94% 99% 95%   Weight:    82.4 kg  Height:        Intake/Output Summary (Last 24 hours) at 04/06/2021 0924 Last data filed at 04/06/2021 0444 Gross per 24 hour  Intake 39.5 ml  Output 200 ml  Net -160.5 ml   Last 3 Weights 04/06/2021 04/05/2021 09/04/2020  Weight (lbs) 181 lb 10.5 oz 183 lb 183 lb 3.2 oz  Weight (kg) 82.4 kg 83.008 kg 83.1 kg     Body mass index is 26.83 kg/m.  General:  Pleasant elderly male appearing in no  acute distress.  HEENT: normal Neck: no JVD. Bilateral carotid bruits Vascular: No carotid bruits; Distal pulses 2+ bilaterally Cardiac:  normal S1, S2; RRR; 2/6 SEM along RUSB.  Lungs:  clear to auscultation bilaterally, no wheezing, rhonchi or rales  Abd: soft, nontender, no hepatomegaly  Ext: no pitting edema Musculoskeletal:  No deformities, BUE and BLE strength normal and equal Skin: warm and dry  Neuro:  CNs 2-12 intact, no focal abnormalities noted Psych:  Normal affect   EKG:  The EKG was personally reviewed and demonstrates: Sinus bradycardia, heart rate 57 with TWI along the lateral leads which is new when compared to prior tracings in 08/2020.  Telemetry:  Telemetry was personally reviewed and demonstrates:  Sinus tachycardia, HR in 90's to low-100's. Occasional PVC's.   Relevant CV Studies:  Cardiac Catheterization: 06/2017 Findings:  1. Severe native CAD with occlusion of the LAD at the mid-portion and 80%  proximal RCA disease  2. Patent LIMA-LAD, LRA graft-distal RCA, SVG-OM/D1.  3. Occlusion of the RIMA mid-graft, though this was semi-engaged.  4. Diffuse plaque and acute on chronic occlusion of the SVG-D2. This is  the likely culprit vessel for  he NSTEMI but SVG is do severely diseased,  that percutaneous intervention is likely not beneficial.  5. Chronic occlusion of  SVG-rPL.   Recommendations:  1. Medical Management of complex CAD and NSTEMI.    Echocardiogram: 09/2020 SUMMARY  The left ventricular size is normal.  There is mild concentric left ventricular hypertrophy.  Upper septal hypertrophy (sigmoid septum), normal variant.  Left ventricular systolic function is mildly reduced.  LV ejection fraction = 45-50%.  There is mild global hypokinesis of the left ventricle.  Left ventricular filling pattern is pseudonormal.  The right ventricle is normal in size and function.  The left atrium is moderately dilated.  The right atrium is mildly dilated.   Diffuse calcification of the aortic valve. The aortic valve is  trileaflet. Moderate aortic stenosis due to calcific disease with peak  velocity 3.2 m/s. Mean gradient 22 mmhg. AVA 1.3 cm2. Moderate aortic  regurgitation.  Moderate mitral regurgitation with central MR jet.  There is mild tricuspid regurgitation.Estimated right ventricular  systolic pressure is 57 mmHg.  Moderate pulmonary hypertension.  Pulmonary venous flow pattern is blunted  IVC normal size with decreased inspiratory collapse, suggestive of  elevated RA pressures.  There is no pericardial effusion.  There is no comparison study available.    Laboratory Data:  High Sensitivity Troponin:   Recent Labs  Lab 04/05/21 1643 04/06/21 0023 04/06/21 0237 04/06/21 0614 04/06/21 0809  TROPONINIHS 29* 89* 150* 328* 653*     Chemistry Recent Labs  Lab 04/05/21 1446 04/06/21 0023  NA 137 134*  K 3.8 3.8  CL 92* 92*  CO2 30 26  GLUCOSE 95 126*  BUN 29* 37*  CREATININE 4.63* 5.18*  CALCIUM 8.8* 9.1  MG  --  1.7  GFRNONAA 13* 11*  ANIONGAP 15 16*    Recent Labs  Lab 04/05/21 1446 04/06/21 0023  PROT 7.8 8.7*  ALBUMIN 4.0 4.4  AST 20 24  ALT 12 13  ALKPHOS 81 95  BILITOT 0.6 0.8   Lipids No results for input(s): CHOL, TRIG, HDL, LABVLDL, LDLCALC, CHOLHDL in the last 168 hours.  Hematology Recent Labs  Lab 04/05/21 1446 04/06/21 0023  WBC 4.1 6.5  RBC 3.77* 4.34  HGB 12.1* 13.7  HCT 37.4* 42.6  MCV 99.2 98.2  MCH 32.1 31.6  MCHC 32.4 32.2  RDW 16.0* 15.9*  PLT 179 162   Thyroid No results for input(s): TSH, FREET4 in the last 168 hours.  BNPNo results for input(s): BNP, PROBNP in the last 168 hours.  DDimer No results for input(s): DDIMER in the last 168 hours.   Radiology/Studies:  CT ABDOMEN PELVIS WO CONTRAST  Result Date: 04/05/2021 CLINICAL DATA:  Possible abscess/ infection Nausea/ vomitting EXAM: CT ABDOMEN AND PELVIS WITHOUT CONTRAST TECHNIQUE: Multidetector CT imaging of the  abdomen and pelvis was performed following the standard protocol without IV contrast. COMPARISON:  None. FINDINGS: Lower chest: No acute abnormality. Aortic valve leaflet calcifications. Coronary artery calcifications. Sternotomy wires noted. Hepatobiliary: No focal liver abnormality. Calcified gallstone noted within the gallbladder lumen. Question pericholecystic fluid. Slight haziness of the gallbladder wall. No definite gallbladder wall thickening no biliary dilatation. Pancreas: No focal lesion. Normal pancreatic contour. No surrounding inflammatory changes. No main pancreatic ductal dilatation. Spleen: Normal in size without focal abnormality. Adrenals/Urinary Tract: There is a 2.4 cm right adrenal gland nodule with a density of -10 Hounsfield units consistent with nodule adenoma. Bilateral renal atrophy. Multiple fluid density lesions likely represent simple renal cysts with the largest on the right measuring up to 8.3 cm. Subcentimeter densities are too small to characterize. There is a 1.3 cm lesion with  a density of 26 Hounsfield units that is indeterminate (2:39) in the left kidney. Calcifications associated with kidneys appear to be vascular. No nephrolithiasis and no hydronephrosis. No definite contour-deforming renal mass. No ureterolithiasis or hydroureter. The urinary bladder is unremarkable. Stomach/Bowel: Stomach is within normal limits. No evidence of bowel wall thickening or dilatation. Scattered colonic diverticulosis. The appendix not definitely identified. Vascular/Lymphatic: No abdominal aorta or iliac aneurysm. Severe atherosclerotic plaque of the aorta and its branches. No abdominal, pelvic, or inguinal lymphadenopathy. Reproductive: Prostate is unremarkable. Other: No intraperitoneal free fluid. No intraperitoneal free gas. No organized fluid collection. Musculoskeletal: Status post right repair with. Likely left repair. No definite inguinal hernia repair. No suspicious lytic or blastic  osseous lesions. No acute displaced fracture. Multilevel degenerative changes of the spine. IMPRESSION: 1. Cholelithiasis with question pericholecystic fluid and slight haziness of the gallbladder wall. Recommend right upper quadrant ultrasound for a more sensitive evaluation of the gallbladder. 2. Indeterminate 1.3 cm left renal lesion. Recommend nonemergent MRI renal protocol for further evaluation. When the patient is clinically stable and able to follow directions and hold their breath (preferably as an outpatient) further evaluation with dedicated abdominal MRI should be considered. 3. Colonic diverticulosis with no acute diverticulitis. 4. 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 5.  Aortic Atherosclerosis (ICD10-I70.0)-severe. Electronically Signed   By: Iven Finn M.D.   On: 04/05/2021 19:18   DG Chest 2 View  Result Date: 04/05/2021 CLINICAL DATA:  Chest pain for 45 minutes, central chest pain and epigastric pain EXAM: CHEST - 2 VIEW COMPARISON:  08/30/2020 FINDINGS: Frontal and lateral views of the chest demonstrate postsurgical changes from prior median sternotomy. The cardiac silhouette is unremarkable. No acute airspace disease, effusion, or pneumothorax. No acute bony abnormalities. IMPRESSION: 1. No acute intrathoracic process. Electronically Signed   By: Randa Ngo M.D.   On: 04/05/2021 15:06     Assessment and Plan:   1. Elevated Troponin Values - He is currently admitted with RUQ/epigastric pain which lasted for 12+ hours and experienced associated nausea and vomiting with this. Reports his pain was significantly different from any of his prior cardiac symptoms. - Hs troponin values have been elevated with his most recent value being at 653. EKG does show T wave inversion along the lateral leads which is new when compared to prior tracings in 08/2020. Will plan to obtain a follow-up echocardiogram to assess for any interval changes in EF or wall motion. Would also help with  preoperative cardiac clearance if he requires surgical procedures this admission.  - At this time, he is pain-free and denies any chest pain or acute dyspnea. Unless his echocardiogram shows significant abnormalities, would not anticipate a repeat catheterization at this time given his known diffuse disease and poor targets by his last catheterization in 2019. He does remain on Heparin at this time and would continue until echo results are available.  2. CAD - He is s/p CABG in 2008 with LIMA-LAD, SVG-D2, SVG-OM and SVG-RPL, DES to SVG-RCA in 01/2014, re-do CABG in 09/2016 with RIMA-diagonal and left radial-distal RCA and most recent cath in 06/2017 showed occluded RIMA graft, chronic occlusion of SVG-rPL and acute on chronic occlusion of SVG-D2 and poor PCI target with medical management recommended. - Will plan for a follow-up echocardiogram as outlined above. Continue Plavix 75 mg daily, Coreg 3.125 mg twice daily, Imdur 120 mg daily and Atorvastatin 80 mg daily. He has not been on ASA given his anemia.   3. HFmrEF -  His EF was at 45-50% by echo in 09/2020. Will plan for repeat echocardiogram. He is on Coreg and Imdur but no longer on Hydralazine given prior hypotension with dialysis.  4. Aortic Stenosis - This was moderate by echocardiogram in 09/2020. Repeat imaging pending.  5. RUQ Pain - CT Abdomen showed cholelithiasis with questionable pericholecystic fluid with right upper quadrant ultrasound recommended. US performed this AM with results pending. Surgery following.   6. ESRD - On HD - MWF schedule. Nephrology following.   For questions or updates, please contact [place] Please consult [email or web address] for contact info under    Signed, Erma Heritage, PA-C  04/06/2021 9:24 AM  Attending note Patient seen and discussed with PA [name], I agree with her documentation. 74 yo male complex medical history including chronic diastolic HF, CAD with CABG 2008 and 2018, history of  prior PCI. Most recently NSTEMI 06/2017 at [facility] with chronic occluded LAD, 80% RCA, patent LIMA-LAD, patent LRA graft to distal RCA, SVG-OM/D1 patent. Occluded RIMA graft, chronic occluded SVG-rPL, acute occluded SVG-D2 thought culprit but poor PCI target with recs for medical therapy. Multipel admits following to [facility] with chest pains, managed medically due to his anatomy. History of ESRD as well as chronic anemia, prior EGD showed actively bleeidn duodenal ulcer. History of bladder cancer followed at [facility].   Admitted with abdominal pain. Burning pain 6/10 radiring into RUQ abdomen, associated with nausea and vomiting. Pain improves with vomiting.    K 3.8 BUN 29 Cr 4.63 WBC 4.1 Hgb 12.1 Plt 179 Lipase 56 Trop 25-->29-->89-->150-->328-->653 EKG SR, lateral TWIs CXR no acute process CT A/P Cholelithiasis with question pericholecystic fluid and slight haziness of the gallbladder wall RUQ [place]: Ultrasound is equivocal for acute cholecystitis, as there is significant gallbladder distension, evidence of cholelithiasis, however, sonographic Murphy's sign is negative.  04/2020 echo: LVEF 55-60%, no WMAs, grade I dd, normal RV, mild to mod MR. mild AS mean grad 18, AVA VTI 1.6 0.44   Trop elevation in setting of primarily epigastric symptoms with N/V. His has very complex anatomy with CABG on 2 separate occasions as well as prior interventinos, most recent admit 2019 with NSTEMI graft disease not amenable to PCI. I think chances of having new disease that could be intervened on is quite low. He also has had intermittent issues with GI bleeding requiring interuption of antiplatelets in the past and would be reluctant to have to commit to DAPT without interruption in absence of high risk presentation. If he does well clinically would just manage medically.   Medical therapy with heparin gtt, atorva 80, coreg 3.187m bid, plavix 75. Would manage with heparin x 48 hours, f/u echo   JCarlyle Dolly MD

## 2021-04-07 ENCOUNTER — Ambulatory Visit: Payer: No Typology Code available for payment source | Admitting: Vascular Surgery

## 2021-04-07 DIAGNOSIS — K8050 Calculus of bile duct without cholangitis or cholecystitis without obstruction: Secondary | ICD-10-CM

## 2021-04-07 DIAGNOSIS — R778 Other specified abnormalities of plasma proteins: Secondary | ICD-10-CM | POA: Diagnosis not present

## 2021-04-07 DIAGNOSIS — N186 End stage renal disease: Secondary | ICD-10-CM | POA: Diagnosis not present

## 2021-04-07 DIAGNOSIS — I5032 Chronic diastolic (congestive) heart failure: Secondary | ICD-10-CM | POA: Diagnosis not present

## 2021-04-07 DIAGNOSIS — K8020 Calculus of gallbladder without cholecystitis without obstruction: Secondary | ICD-10-CM | POA: Diagnosis not present

## 2021-04-07 LAB — COMPREHENSIVE METABOLIC PANEL
ALT: 13 U/L (ref 0–44)
AST: 28 U/L (ref 15–41)
Albumin: 3.6 g/dL (ref 3.5–5.0)
Alkaline Phosphatase: 80 U/L (ref 38–126)
Anion gap: 17 — ABNORMAL HIGH (ref 5–15)
BUN: 59 mg/dL — ABNORMAL HIGH (ref 8–23)
CO2: 23 mmol/L (ref 22–32)
Calcium: 9.1 mg/dL (ref 8.9–10.3)
Chloride: 92 mmol/L — ABNORMAL LOW (ref 98–111)
Creatinine, Ser: 7.77 mg/dL — ABNORMAL HIGH (ref 0.61–1.24)
GFR, Estimated: 7 mL/min — ABNORMAL LOW (ref >60)
Glucose, Bld: 91 mg/dL (ref 70–99)
Potassium: 4.8 mmol/L (ref 3.5–5.1)
Sodium: 132 mmol/L — ABNORMAL LOW (ref 135–145)
Total Bilirubin: 1 mg/dL (ref 0.3–1.2)
Total Protein: 7.4 g/dL (ref 6.5–8.1)

## 2021-04-07 LAB — CBC
HCT: 38 % — ABNORMAL LOW (ref 39.0–52.0)
Hemoglobin: 12.3 g/dL — ABNORMAL LOW (ref 13.0–17.0)
MCH: 30.9 pg (ref 26.0–34.0)
MCHC: 32.4 g/dL (ref 30.0–36.0)
MCV: 95.5 fL (ref 80.0–100.0)
Platelets: 164 10*3/uL (ref 150–400)
RBC: 3.98 MIL/uL — ABNORMAL LOW (ref 4.22–5.81)
RDW: 15.9 % — ABNORMAL HIGH (ref 11.5–15.5)
WBC: 14.8 10*3/uL — ABNORMAL HIGH (ref 4.0–10.5)
nRBC: 0 % (ref 0.0–0.2)

## 2021-04-07 LAB — HEPARIN LEVEL (UNFRACTIONATED)
Heparin Unfractionated: 0.26 IU/mL — ABNORMAL LOW (ref 0.30–0.70)
Heparin Unfractionated: 0.3 IU/mL (ref 0.30–0.70)
Heparin Unfractionated: 0.26 IU/mL — ABNORMAL LOW (ref 0.30–0.70)

## 2021-04-07 LAB — HEPATITIS B SURFACE ANTIGEN: Hepatitis B Surface Ag: NONREACTIVE

## 2021-04-07 LAB — MRSA NEXT GEN BY PCR, NASAL: MRSA by PCR Next Gen: NOT DETECTED

## 2021-04-07 LAB — HEPATITIS B SURFACE ANTIBODY,QUALITATIVE: Hep B S Ab: NONREACTIVE

## 2021-04-07 LAB — SARS CORONAVIRUS 2 (TAT 6-24 HRS): SARS Coronavirus 2: NEGATIVE

## 2021-04-07 LAB — TROPONIN I (HIGH SENSITIVITY): Troponin I (High Sensitivity): 2885 ng/L (ref <18)

## 2021-04-07 MED ORDER — LIDOCAINE-PRILOCAINE 2.5-2.5 % EX CREA: 1.0000 "application " | TOPICAL_CREAM | CUTANEOUS | Status: DC | PRN

## 2021-04-07 MED ORDER — IPRATROPIUM-ALBUTEROL 0.5-2.5 (3) MG/3ML IN SOLN: 3.0000 mL | Freq: Four times a day (QID) | RESPIRATORY_TRACT | Status: DC | PRN

## 2021-04-07 MED ORDER — SODIUM CHLORIDE 0.9 % IV SOLN: 100.0000 mL | INTRAVENOUS | Status: DC | PRN

## 2021-04-07 MED ORDER — PENTAFLUOROPROP-TETRAFLUOROETH EX AERO: 1.0000 "application " | INHALATION_SPRAY | CUTANEOUS | Status: DC | PRN

## 2021-04-07 MED ORDER — HEPARIN BOLUS VIA INFUSION
1500.0000 [IU] | Freq: Once | INTRAVENOUS | Status: AC
  Administered 2021-04-07: 1500 [IU] via INTRAVENOUS
  Filled 2021-04-07: qty 1500

## 2021-04-07 MED ORDER — LIDOCAINE HCL (PF) 1 % IJ SOLN: 5.0000 mL | INTRAMUSCULAR | Status: DC | PRN

## 2021-04-07 NOTE — Progress Notes (Signed)
PROGRESS NOTE  [name]:[id number] DOB: 1945-07-03 DOA: 04/05/2021 PCP: [facility]  Brief History:  75 year old male with a history of end-stage renal disease on hemodialysis, hypertension, coronary artery disease, CHF, BPH, presents to the hospital with abdominal pain.  Reports epigastric pain with slight radiation to the right and was associated with nausea and vomiting.  Initial concern was for biliary source, but imaging was not convincing for cholecystitis.  Clinically, overall he has improved and is now tolerating a diet without pain or vomiting.  Suspect that he may have had biliary colic which is passed.  Was also noted to have elevated troponins.  Seen by cardiology with decision to treat conservatively.  He is on medical management.  Nephrology following for dialysis needs.  Assessment/Plan: Abdominal pain/Biliary Colic -Possibly secondary to biliary colic -Clinically, appears to have resolved -11/21 CT abd--Cholelithiasis with question pericholecystic fluid and slight haziness of the gallbladder wall -Ultrasound right upper quadrant was equivocal -Patient is tolerating diet at this time without worsening pain or vomiting -LFTs unremarkable -Seen by general surgery, no indication for surgery at this time--case discussed with Dr. Arnoldo Morale -Can be followed as needed as an outpatient -continue IV protonix -diet advanced which patient tolerated   Elevated troponin  -Troponin up to 2885 -Started on IV heparin infusion -Cardiology following -Patient with difficult coronary anatomy status post CABG.  Most recent cardiac cath in 2019 did not show any amenable targets for intervention -Patient is also had difficulty coming to [facility] due to issues with GI bleeding -11/22 Echo EF 55-60,  no WMA -Recommendations were to continue with heparin infusion x48 hours -Continue statin, Coreg, Plavix   End-stage renal disease on hemodialysis -Nephrology  following -Dialysis 11/23   Hypertension -Currently on Coreg, Imdur, as needed hydralazine -Blood pressure currently stable -Continue to use IV hydralazine as needed   Incidental finding of left renal lesion -Can consider nonemergent MRI with renal protocol for further outpatient work-up   Right adrenal adenoma -Unchanged from prior imaging   Hyperlipidemia -Continue statin   Hypothyroidism -Continue on Synthroid   BPH -Continue Proscar and Flomax        Status is: Inpatient  Remains inpatient appropriate because: severity of illness requiring IV heparin        Family Communication:   no Family at bedside  Consultants:  cardiology, general surgery  Code Status:  FULL  DVT Prophylaxis:  IV Heparin --Start: 04/05/21 2325   Procedures: As Listed in Progress Note Above  Antibiotics: None       Subjective: Patient is feeling better.  States abd pain is improved.  Denies f/c, cp, sob, n/v/d  Objective: Vitals:   04/07/21 1400 04/07/21 1430 04/07/21 1500 04/07/21 1530  BP: (!) 107/43 (!) 117/49 130/60 120/70  Pulse: 100 (!) 104 (!) 106 (!) 109  Resp: 20 18 20 20   Temp:      TempSrc:      SpO2:      Weight:      Height:        Intake/Output Summary (Last 24 hours) at 04/07/2021 1621 Last data filed at 04/07/2021 0900 Gross per 24 hour  Intake 840 ml  Output --  Net 840 ml   Weight change:  Exam:  General:  Pt is alert, follows commands appropriately, not in acute distress HEENT: No icterus, No thrush, No neck mass, [place]/AT Cardiovascular: RRR, S1/S2, no rubs, no gallops Respiratory: fine bibasilar crackles.  No wheeze Abdomen: Soft/+BS, minimal epigastric tender, non distended, no guarding Extremities: No edema, No lymphangitis, No petechiae, No rashes, no synovitis   Data Reviewed: I have personally reviewed following labs and imaging studies Basic Metabolic Panel: Recent Labs  Lab 04/05/21 1446 04/06/21 0023 04/07/21 0623  NA  137 134* 132*  K 3.8 3.8 4.8  CL 92* 92* 92*  CO2 30 26 23   GLUCOSE 95 126* 91  BUN 29* 37* 59*  CREATININE 4.63* 5.18* 7.77*  CALCIUM 8.8* 9.1 9.1  MG  --  1.7  --   PHOS  --  6.4*  --    Liver Function Tests: Recent Labs  Lab 04/05/21 1446 04/06/21 0023 04/07/21 0623  AST 20 24 28   ALT 12 13 13   ALKPHOS 81 95 80  BILITOT 0.6 0.8 1.0  PROT 7.8 8.7* 7.4  ALBUMIN 4.0 4.4 3.6   Recent Labs  Lab 04/05/21 1446  LIPASE 56*   No results for input(s): AMMONIA in the last 168 hours. Coagulation Profile: No results for input(s): INR, PROTIME in the last 168 hours. CBC: Recent Labs  Lab 04/05/21 1446 04/06/21 0023 04/07/21 0623  WBC 4.1 6.5 14.8*  HGB 12.1* 13.7 12.3*  HCT 37.4* 42.6 38.0*  MCV 99.2 98.2 95.5  PLT 179 162 164   Cardiac Enzymes: No results for input(s): CKTOTAL, CKMB, CKMBINDEX, TROPONINI in the last 168 hours. BNP: Invalid input(s): POCBNP CBG: No results for input(s): GLUCAP in the last 168 hours. HbA1C: No results for input(s): HGBA1C in the last 72 hours. Urine analysis:    Component Value Date/Time   COLORURINE YELLOW 02/09/2020 [place] 02/09/2020 0958   LABSPEC 1.012 02/09/2020 0958   PHURINE 5.0 02/09/2020 0958   GLUCOSEU NEGATIVE 02/09/2020 0958   HGBUR NEGATIVE 02/09/2020 0958   BILIRUBINUR NEGATIVE 02/09/2020 0958   KETONESUR NEGATIVE 02/09/2020 0958   PROTEINUR 100 (A) 02/09/2020 0958   NITRITE NEGATIVE 02/09/2020 0958   LEUKOCYTESUR NEGATIVE 02/09/2020 0958   Sepsis Labs: @LABRCNTIP (procalcitonin:4,lacticidven:4) ) Recent Results (from the past 240 hour(s))  MRSA Next Gen by PCR, Nasal     Status: None   Collection Time: 04/07/21 12:23 AM   Specimen: Nasal Mucosa; Nasal Swab  Result Value Ref Range Status   MRSA by PCR Next Gen NOT DETECTED NOT DETECTED Final    Comment: (NOTE) The GeneXpert MRSA Assay (FDA approved for NASAL specimens only), is one component of a comprehensive MRSA colonization  surveillance program. It is not intended to diagnose MRSA infection nor to guide or monitor treatment for MRSA infections. Test performance is not FDA approved in patients less than 19 years old. Performed at [facility], [address], [place], [place] [address]   SARS CORONAVIRUS 2 (Krishawna Stiefel 6-24 HRS) Nasopharyngeal Nasopharyngeal Swab     Status: None   Collection Time: 04/07/21 12:56 AM   Specimen: Nasopharyngeal Swab  Result Value Ref Range Status   SARS Coronavirus 2 NEGATIVE NEGATIVE Final    Comment: (NOTE) SARS-CoV-2 target nucleic acids are NOT DETECTED.  The SARS-CoV-2 RNA is generally detectable in upper and lower respiratory specimens during the acute phase of infection. Negative results do not preclude SARS-CoV-2 infection, do not rule out co-infections with other pathogens, and should not be used as the sole basis for treatment or other patient management decisions. Negative results must be combined with clinical observations, patient history, and epidemiological information. The expected result is Negative.  Fact Sheet for Patients: SugarRoll.be  Fact Sheet for  Healthcare Providers: https://www.woods-mathews.com/  This test is not yet approved or cleared by the [place] and  has been authorized for detection and/or diagnosis of SARS-CoV-2 by FDA under an Emergency Use Authorization (EUA). This EUA will remain  in effect (meaning this test can be used) for the duration of the COVID-19 declaration under Se ction 564(b)(1) of the Act, 21 U.S.C. section 360bbb-3(b)(1), unless the authorization is terminated or revoked sooner.  Performed at [facility], [place] [address], [place], [place] [address]      Scheduled Meds:  alum & mag hydroxide-simeth  30 mL Oral Once   And   lidocaine  15 mL Oral Once   atorvastatin  80 mg Oral q1800   carvedilol  3.125 mg Oral BID   Chlorhexidine Gluconate Cloth  6 each Topical  Q0600   cholecalciferol  1,000 Units Oral Daily   clopidogrel  75 mg Oral Daily   finasteride  5 mg Oral Daily   furosemide  80 mg Oral Q T,Th,S,Su   isosorbide mononitrate  120 mg Oral Daily   levothyroxine  50 mcg Oral QAC breakfast   omega-3 acid ethyl esters  1 g Oral BID   pantoprazole (PROTONIX) IV  40 mg Intravenous Q12H   tamsulosin  0.4 mg Oral Daily   Continuous Infusions:  sodium chloride     sodium chloride     heparin 1,450 Units/hr (04/07/21 1545)    Procedures/Studies: CT ABDOMEN PELVIS WO CONTRAST  Result Date: 04/05/2021 CLINICAL DATA:  Possible abscess/ infection Nausea/ vomitting EXAM: CT ABDOMEN AND PELVIS WITHOUT CONTRAST TECHNIQUE: Multidetector CT imaging of the abdomen and pelvis was performed following the standard protocol without IV contrast. COMPARISON:  None. FINDINGS: Lower chest: No acute abnormality. Aortic valve leaflet calcifications. Coronary artery calcifications. Sternotomy wires noted. Hepatobiliary: No focal liver abnormality. Calcified gallstone noted within the gallbladder lumen. Question pericholecystic fluid. Slight haziness of the gallbladder wall. No definite gallbladder wall thickening no biliary dilatation. Pancreas: No focal lesion. Normal pancreatic contour. No surrounding inflammatory changes. No main pancreatic ductal dilatation. Spleen: Normal in size without focal abnormality. Adrenals/Urinary Tract: There is a 2.4 cm right adrenal gland nodule with a density of -10 Hounsfield units consistent with nodule adenoma. Bilateral renal atrophy. Multiple fluid density lesions likely represent simple renal cysts with the largest on the right measuring up to 8.3 cm. Subcentimeter densities are too small to characterize. There is a 1.3 cm lesion with a density of 26 Hounsfield units that is indeterminate (2:39) in the left kidney. Calcifications associated with kidneys appear to be vascular. No nephrolithiasis and no hydronephrosis. No definite  contour-deforming renal mass. No ureterolithiasis or hydroureter. The urinary bladder is unremarkable. Stomach/Bowel: Stomach is within normal limits. No evidence of bowel wall thickening or dilatation. Scattered colonic diverticulosis. The appendix not definitely identified. Vascular/Lymphatic: No abdominal aorta or iliac aneurysm. Severe atherosclerotic plaque of the aorta and its branches. No abdominal, pelvic, or inguinal lymphadenopathy. Reproductive: Prostate is unremarkable. Other: No intraperitoneal free fluid. No intraperitoneal free gas. No organized fluid collection. Musculoskeletal: Status post right repair with. Likely left repair. No definite inguinal hernia repair. No suspicious lytic or blastic osseous lesions. No acute displaced fracture. Multilevel degenerative changes of the spine. IMPRESSION: 1. Cholelithiasis with question pericholecystic fluid and slight haziness of the gallbladder wall. Recommend right upper quadrant ultrasound for a more sensitive evaluation of the gallbladder. 2. Indeterminate 1.3 cm left renal lesion. Recommend nonemergent MRI renal protocol for further evaluation. When the  patient is clinically stable and able to follow directions and hold their breath (preferably as an outpatient) further evaluation with dedicated abdominal MRI should be considered. 3. Colonic diverticulosis with no acute diverticulitis. 4. 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 5.  Aortic Atherosclerosis (ICD10-I70.0)-severe. Electronically Signed   By: Iven Finn M.D.   On: 04/05/2021 19:18   DG Chest 2 View  Result Date: 04/05/2021 CLINICAL DATA:  Chest pain for 45 minutes, central chest pain and epigastric pain EXAM: CHEST - 2 VIEW COMPARISON:  08/30/2020 FINDINGS: Frontal and lateral views of the chest demonstrate postsurgical changes from prior median sternotomy. The cardiac silhouette is unremarkable. No acute airspace disease, effusion, or pneumothorax. No acute bony abnormalities.  IMPRESSION: 1. No acute intrathoracic process. Electronically Signed   By: Randa Ngo M.D.   On: 04/05/2021 15:06   ECHOCARDIOGRAM COMPLETE  Result Date: 04/06/2021    ECHOCARDIOGRAM REPORT   Patient Name:   [name] Date of Exam: 04/06/2021 Medical Rec #:  [id number]      Height:       69.0 in Accession #:    [id number]     Weight:       181.7 lb Date of Birth:  1946-01-08      BSA:          1.983 m Patient Age:    86 years       BP:           185/61 mmHg Patient Gender: M              HR:           91 bpm. Exam Location:  [facility] Procedure: 2D Echo, Cardiac Doppler and Color Doppler Indications:    Chest Pain  History:        Patient has prior history of Echocardiogram examinations, most                 recent 05/09/2020. CHF, Previous Myocardial Infarction and CAD,                 Prior Cardiac Surgery and Prior CABG, COPD, Signs/Symptoms:Chest                 Pain; Risk Factors:Hypertension, Former Smoker and Dyslipidemia.  Sonographer:    Wenda Low Referring Phys: [id number] [facility]  1. Left ventricular ejection fraction, by estimation, is 55 to 60%. The left ventricle has normal function. The left ventricle has no regional wall motion abnormalities. There is severe left ventricular hypertrophy. Left ventricular diastolic parameters  are indeterminate.  2. Right ventricular systolic function is normal. The right ventricular size is normal. There is normal pulmonary artery systolic pressure.  3. The mitral valve is normal in structure. No evidence of mitral valve regurgitation. No evidence of mitral stenosis.  4. The aortic valve has an indeterminant number of cusps. There is severe calcifcation of the aortic valve. There is severe thickening of the aortic valve. Aortic valve regurgitation is mild. Mild to moderate aortic valve stenosis.  5. The inferior vena cava is normal in size with greater than 50% respiratory variability, suggesting right atrial pressure of 3  mmHg. FINDINGS  Left Ventricle: Left ventricular ejection fraction, by estimation, is 55 to 60%. The left ventricle has normal function. The left ventricle has no regional wall motion abnormalities. The left ventricular internal cavity size was normal in size. There is  severe left ventricular hypertrophy. Left ventricular diastolic parameters are  indeterminate. Right Ventricle: The right ventricular size is normal. No increase in right ventricular wall thickness. Right ventricular systolic function is normal. There is normal pulmonary artery systolic pressure. The tricuspid regurgitant velocity is 1.70 m/s, and  with an assumed right atrial pressure of 3 mmHg, the estimated right ventricular systolic pressure is 52.7 mmHg. Left Atrium: Left atrial size was normal in size. Right Atrium: Right atrial size was normal in size. Pericardium: There is no evidence of pericardial effusion. Mitral Valve: The mitral valve is normal in structure. No evidence of mitral valve regurgitation. No evidence of mitral valve stenosis. MV peak gradient, 5.0 mmHg. The mean mitral valve gradient is 2.0 mmHg. Tricuspid Valve: The tricuspid valve is normal in structure. Tricuspid valve regurgitation is not demonstrated. No evidence of tricuspid stenosis. Aortic Valve: The aortic valve has an indeterminant number of cusps. There is severe calcifcation of the aortic valve. There is severe thickening of the aortic valve. There is severe aortic valve annular calcification. Aortic valve regurgitation is mild.  Aortic regurgitation PHT measures 376 msec. Mild to moderate aortic stenosis is present. Aortic valve mean gradient measures 17.2 mmHg. Aortic valve peak gradient measures 30.7 mmHg. Aortic valve area, by VTI measures 1.20 cm. Pulmonic Valve: The pulmonic valve was not well visualized. Pulmonic valve regurgitation is not visualized. No evidence of pulmonic stenosis. Aorta: The aortic root is normal in size and structure. Venous: The  inferior vena cava is normal in size with greater than 50% respiratory variability, suggesting right atrial pressure of 3 mmHg. IAS/Shunts: No atrial level shunt detected by color flow Doppler.  LEFT VENTRICLE PLAX 2D LVIDd:         3.70 cm     Diastology LVIDs:         2.75 cm     LV e' medial:    5.77 cm/s LV PW:         1.50 cm     LV E/e' medial:  14.9 LV IVS:        1.50 cm     LV e' lateral:   8.38 cm/s LVOT diam:     2.00 cm     LV E/e' lateral: 10.3 LV SV:         61 LV SV Index:   31 LVOT Area:     3.14 cm  LV Volumes (MOD) LV vol d, MOD A2C: 66.1 ml LV vol d, MOD A4C: 54.4 ml LV vol s, MOD A2C: 34.6 ml LV vol s, MOD A4C: 26.9 ml LV SV MOD A2C:     31.5 ml LV SV MOD A4C:     54.4 ml LV SV MOD BP:      28.7 ml RIGHT VENTRICLE RV Basal diam:  3.30 cm RV Mid diam:    2.30 cm LEFT ATRIUM             Index        RIGHT ATRIUM           Index LA diam:        3.50 cm 1.76 cm/m   RA Area:     18.30 cm LA Vol (A2C):   76.8 ml 38.72 ml/m  RA Volume:   45.90 ml  23.14 ml/m LA Vol (A4C):   66.0 ml 33.28 ml/m LA Biplane Vol: 72.1 ml 36.35 ml/m  AORTIC VALVE                     PULMONIC VALVE AV  Area (Vmax):    1.04 cm      PV Vmax:       1.08 m/s AV Area (Vmean):   1.12 cm      PV Peak grad:  4.7 mmHg AV Area (VTI):     1.20 cm AV Vmax:           277.00 cm/s AV Vmean:          193.000 cm/s AV VTI:            0.507 m AV Peak Grad:      30.7 mmHg AV Mean Grad:      17.2 mmHg LVOT Vmax:         92.00 cm/s LVOT Vmean:        69.000 cm/s LVOT VTI:          0.193 m LVOT/AV VTI ratio: 0.38 AI PHT:            376 msec  AORTA Ao Root diam: 3.30 cm Ao Asc diam:  3.10 cm MITRAL VALVE                TRICUSPID VALVE MV Area (PHT): 4.06 cm     TR Peak grad:   11.6 mmHg MV Area VTI:   2.77 cm     TR Vmax:        170.00 cm/s MV Peak grad:  5.0 mmHg MV Mean grad:  2.0 mmHg     SHUNTS MV Vmax:       1.12 m/s     Systemic VTI:  0.19 m MV Vmean:      59.3 cm/s    Systemic Diam: 2.00 cm MV Decel Time: 187 msec MV E velocity:  86.10 cm/s MV A velocity: 104.00 cm/s MV E/A ratio:  0.83 Carlyle Dolly MD Electronically signed by Carlyle Dolly MD Signature Date/Time: 04/06/2021/4:28:18 PM    Final    US Abdomen Limited RUQ (LIVER/GB)  Result Date: 04/06/2021 CLINICAL DATA:  76 year old male with a history of right upper quadrant pain EXAM: ULTRASOUND ABDOMEN LIMITED RIGHT UPPER QUADRANT COMPARISON:  CT 04/05/2021, ultrasound 02/07/2020 FINDINGS: Gallbladder: Gallbladder distended. The thickness of the near field wall measures 3 mm. Echogenic debris/stones at the gallbladder fundus, compatible with prior CT. Sonographic Murphy sign labeled as negative. Common bile duct: Diameter: Common bile duct measures 5 mm. Liver: No focal lesion identified. Within normal limits in parenchymal echogenicity. Portal vein is patent on color Doppler imaging with normal direction of blood flow towards the liver. Other: Incidental imaging of renal cysts which were present on the comparison CT. IMPRESSION: Ultrasound is equivocal for acute cholecystitis, as there is significant gallbladder distension, evidence of cholelithiasis, however, sonographic Murphy's sign is negative. Electronically Signed   By: Corrie Mckusick D.O.   On: 04/06/2021 10:51    Orson Eva, DO  [facility]  If 7PM-7AM, please contact night-coverage [email or web address] Password [email or web address] 04/07/2021, 4:21 PM   LOS: 2 days

## 2021-04-07 NOTE — Progress Notes (Signed)
ANTICOAGULATION CONSULT NOTE   Pharmacy Consult for Heparin Indication: chest pain/ACS  Allergies  Allergen Reactions   Cardizem Cd [Diltiazem Hcl Er Beads] Palpitations    "Makes heart skip"   Cardizem [Diltiazem]     Patient Measurements: Height: 5\' 9"  (175.3 cm) Weight: 82.4 kg (181 lb 10.5 oz) IBW/kg (Calculated) : 70.7 HEPARIN DW (KG): 83   Vital Signs: Temp: 99.4 F (37.4 C) (11/23 0556) BP: 111/50 (11/23 0556) Pulse Rate: 91 (11/23 0556)  Labs: Recent Labs    04/05/21 1446 04/05/21 1643 04/06/21 0023 04/06/21 0237 04/06/21 0614 04/06/21 0809 04/06/21 1138 04/06/21 2155 04/07/21 0623  HGB 12.1*  --  13.7  --   --   --   --   --  12.3*  HCT 37.4*  --  42.6  --   --   --   --   --  38.0*  PLT 179  --  162  --   --   --   --   --  164  APTT  --   --  34  --   --   --   --   --   --   HEPARINUNFRC  --   --   --   --   --   --  0.17* 0.33 0.26*  CREATININE 4.63*  --  5.18*  --   --   --   --   --  7.77*  TROPONINIHS 25*   < > 89*   < > 328* 653*  --   --  2,885*   < > = values in this interval not displayed.     Estimated Creatinine Clearance: 8.3 mL/min (A) (by C-G formula based on SCr of 7.77 mg/dL (H)).   Medical History: Past Medical History:  Diagnosis Date   Acute myocardial infarction of other inferior wall, initial episode of care    Anemia    Anginal pain ([place])    Anxiety    Arthritis    Cancer ([place])    bladder 2013 removed, has cystoscopy 10/2016, has returned x 2   COPD (chronic obstructive pulmonary disease) ([place])    Coronary artery disease    Artery bypass graft Jan 2008   Dialysis patient [facility])    Dyspnea    Dysrhythmia    GERD (gastroesophageal reflux disease)    Heart murmur    History of hiatal hernia    Hyperlipidemia    Hypertension    Hypothyroidism    PONV (postoperative nausea and vomiting)    Renal disorder    Sleep apnea    Stroke ([place])    Tobacco user     Medications:  No current facility-administered medications  on file prior to encounter.   Current Outpatient Medications on File Prior to Encounter  Medication Sig Dispense Refill   acetaminophen (TYLENOL) 325 MG tablet Take 2 tablets (650 mg total) by mouth every 6 (six) hours as needed for mild pain.     allopurinol (ZYLOPRIM) 100 MG tablet Take 100 mg by mouth daily.      atorvastatin (LIPITOR) 80 MG tablet Take 80 mg by mouth daily.     carvedilol (COREG) 3.125 MG tablet Take 1 tablet (3.125 mg total) by mouth 2 (two) times daily. 60 tablet 3   clopidogrel (PLAVIX) 75 MG tablet Take 75 mg by mouth daily.     finasteride (PROSCAR) 5 MG tablet Take 5 mg by mouth daily.     folic acid (FOLVITE) 1  MG tablet Take 1 mg by mouth daily.     furosemide (LASIX) 80 MG tablet Take 1 tablet (80 mg total) by mouth every Tuesday, Thursday, Saturday, and Sunday.     isosorbide mononitrate (IMDUR) 120 MG 24 hr tablet Take 1 tablet (120 mg total) by mouth daily. (Patient taking differently: Take 240 mg by mouth daily.) 30 tablet 5   levothyroxine (SYNTHROID, LEVOTHROID) 50 MCG tablet Take 50 mcg by mouth daily before breakfast.     nitroGLYCERIN (NITROSTAT) 0.4 MG SL tablet Place 0.4 mg under the tongue every 5 (five) minutes as needed for chest pain (max 3 doses).     Omega-3 Fatty Acids (RA FISH OIL) 1000 MG CAPS Take 2,000 mg by mouth 2 (two) times daily.     omeprazole (PRILOSEC) 20 MG capsule Take 20 mg by mouth 2 (two) times daily.     tamsulosin (FLOMAX) 0.4 MG CAPS capsule Take 0.4 mg by mouth daily.     Vitamin D, Cholecalciferol, 25 MCG (1000 UT) CAPS Take 1,000 Units by mouth daily.     epoetin alfa (EPOGEN) 20000 UNIT/ML injection Inject 20,000 Units into the skin every Monday. (Patient not taking: Reported on 04/06/2021)     hydrALAZINE (APRESOLINE) 100 MG tablet Take 1 tablet (100 mg total) by mouth 3 (three) times daily. (Patient not taking: Reported on 04/06/2021) 270 tablet 9   magnesium oxide (MAG-OX) 400 MG tablet Take 1 tablet (400 mg total) by  mouth daily. (Patient not taking: Reported on 04/06/2021) 30 tablet 2     Assessment: 75 y.o. male admitted with RUQ/epigastric pain which lasted for 12+ hours and experienced associated nausea and vomiting with this.. He has elevated troponin, possible ACS, for heparin  HL 0.26, subtherapeutic, no issues with infusion  Goal of Therapy:  Heparin level 0.3-0.7 units/ml Monitor platelets by anticoagulation protocol: Yes   Plan:  Heparin 1500 units IV bolus, then increase heparin infusion to 1450 units/hr Check heparin level in 8 hours and daily while on heparin Monitor for s/s of bleeding   Isac Sarna, BS [place], BCPS Clinical Pharmacist Pager [phone]

## 2021-04-07 NOTE — Progress Notes (Deleted)
ANTICOAGULATION CONSULT NOTE - Follow Up Consult  Pharmacy Consult for heparin Indication:  elevated troponin  Labs: Recent Labs    04/05/21 1446 04/05/21 1643 04/06/21 0023 04/06/21 0237 04/06/21 0614 04/06/21 0809 04/06/21 1138 04/07/21 0623 04/07/21 1540 04/07/21 2310  HGB 12.1*  --  13.7  --   --   --   --  12.3*  --   --   HCT 37.4*  --  42.6  --   --   --   --  38.0*  --   --   PLT 179  --  162  --   --   --   --  164  --   --   APTT  --   --  34  --   --   --   --   --   --   --   HEPARINUNFRC  --   --   --   --   --   --    < > 0.26* 0.30 0.26*  CREATININE 4.63*  --  5.18*  --   --   --   --  7.77*  --   --   TROPONINIHS 25*   < > 89*   < > 328* 653*  --  2,885*  --   --    < > = values in this interval not displayed.    Assessment/Plan:  75yo male therapeutic on heparin after rate change. Will continue infusion at current rate of 1550 units/hr and confirm stable with am labs.   Wynona Neat, PharmD, BCPS  04/07/2021,11:52 PM

## 2021-04-07 NOTE — Progress Notes (Signed)
Date and time results received: 04/07/21 0708  Test: Troponin Critical Value: 2885  Name of Provider Notified: Tat  Orders Received? Or Actions Taken?: Awaiting response

## 2021-04-07 NOTE — Progress Notes (Addendum)
Patient ID: [name], male   DOB: November 15, 1945, 75 y.o.   MRN: [id number] S: Feeling better today. O:BP (!) 111/50 (BP Location: Left Wrist)   Pulse 91   Temp 99.4 F (37.4 C)   Resp 18   Ht 5\' 9"  (1.753 m)   Wt 82.4 kg   SpO2 90%   BMI 26.83 kg/m   Intake/Output Summary (Last 24 hours) at 04/07/2021 1007 Last data filed at 04/07/2021 0500 Gross per 24 hour  Intake 960 ml  Output --  Net 960 ml   Intake/Output: I/O last 3 completed shifts: In: 1479.5 [P.O.:1440; IV Piggyback:39.5] Out: 200 [Emesis/NG output:200]  Intake/Output this shift:  No intake/output data recorded. Weight change:  Gen:NAD CVS: RRR Resp:CTA Abd: +BS, soft, mildly tender to palpation in epigastric and RUQ area, no guarding/rebound Ext:no edema RUE AVF +T/B  Recent Labs  Lab 04/05/21 1446 04/06/21 0023 04/07/21 0623  NA 137 134* 132*  K 3.8 3.8 4.8  CL 92* 92* 92*  CO2 30 26 23   GLUCOSE 95 126* 91  BUN 29* 37* 59*  CREATININE 4.63* 5.18* 7.77*  ALBUMIN 4.0 4.4 3.6  CALCIUM 8.8* 9.1 9.1  PHOS  --  6.4*  --   AST 20 24 28   ALT 12 13 13    Liver Function Tests: Recent Labs  Lab 04/05/21 1446 04/06/21 0023 04/07/21 0623  AST 20 24 28   ALT 12 13 13   ALKPHOS 81 95 80  BILITOT 0.6 0.8 1.0  PROT 7.8 8.7* 7.4  ALBUMIN 4.0 4.4 3.6   Recent Labs  Lab 04/05/21 1446  LIPASE 56*   No results for input(s): AMMONIA in the last 168 hours. CBC: Recent Labs  Lab 04/05/21 1446 04/06/21 0023 04/07/21 0623  WBC 4.1 6.5 14.8*  HGB 12.1* 13.7 12.3*  HCT 37.4* 42.6 38.0*  MCV 99.2 98.2 95.5  PLT 179 162 164   Cardiac Enzymes: No results for input(s): CKTOTAL, CKMB, CKMBINDEX, TROPONINI in the last 168 hours. CBG: No results for input(s): GLUCAP in the last 168 hours.  Iron Studies: No results for input(s): IRON, TIBC, TRANSFERRIN, FERRITIN in the last 72 hours. Studies/Results: CT ABDOMEN PELVIS WO CONTRAST  Result Date: 04/05/2021 CLINICAL DATA:  Possible abscess/ infection  Nausea/ vomitting EXAM: CT ABDOMEN AND PELVIS WITHOUT CONTRAST TECHNIQUE: Multidetector CT imaging of the abdomen and pelvis was performed following the standard protocol without IV contrast. COMPARISON:  None. FINDINGS: Lower chest: No acute abnormality. Aortic valve leaflet calcifications. Coronary artery calcifications. Sternotomy wires noted. Hepatobiliary: No focal liver abnormality. Calcified gallstone noted within the gallbladder lumen. Question pericholecystic fluid. Slight haziness of the gallbladder wall. No definite gallbladder wall thickening no biliary dilatation. Pancreas: No focal lesion. Normal pancreatic contour. No surrounding inflammatory changes. No main pancreatic ductal dilatation. Spleen: Normal in size without focal abnormality. Adrenals/Urinary Tract: There is a 2.4 cm right adrenal gland nodule with a density of -10 Hounsfield units consistent with nodule adenoma. Bilateral renal atrophy. Multiple fluid density lesions likely represent simple renal cysts with the largest on the right measuring up to 8.3 cm. Subcentimeter densities are too small to characterize. There is a 1.3 cm lesion with a density of 26 Hounsfield units that is indeterminate (2:39) in the left kidney. Calcifications associated with kidneys appear to be vascular. No nephrolithiasis and no hydronephrosis. No definite contour-deforming renal mass. No ureterolithiasis or hydroureter. The urinary bladder is unremarkable. Stomach/Bowel: Stomach is within normal limits. No evidence of bowel wall thickening or dilatation. Scattered colonic  diverticulosis. The appendix not definitely identified. Vascular/Lymphatic: No abdominal aorta or iliac aneurysm. Severe atherosclerotic plaque of the aorta and its branches. No abdominal, pelvic, or inguinal lymphadenopathy. Reproductive: Prostate is unremarkable. Other: No intraperitoneal free fluid. No intraperitoneal free gas. No organized fluid collection. Musculoskeletal: Status post  right repair with. Likely left repair. No definite inguinal hernia repair. No suspicious lytic or blastic osseous lesions. No acute displaced fracture. Multilevel degenerative changes of the spine. IMPRESSION: 1. Cholelithiasis with question pericholecystic fluid and slight haziness of the gallbladder wall. Recommend right upper quadrant ultrasound for a more sensitive evaluation of the gallbladder. 2. Indeterminate 1.3 cm left renal lesion. Recommend nonemergent MRI renal protocol for further evaluation. When the patient is clinically stable and able to follow directions and hold their breath (preferably as an outpatient) further evaluation with dedicated abdominal MRI should be considered. 3. Colonic diverticulosis with no acute diverticulitis. 4. 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 5.  Aortic Atherosclerosis (ICD10-I70.0)-severe. Electronically Signed   By: Iven Finn M.D.   On: 04/05/2021 19:18   DG Chest 2 View  Result Date: 04/05/2021 CLINICAL DATA:  Chest pain for 45 minutes, central chest pain and epigastric pain EXAM: CHEST - 2 VIEW COMPARISON:  08/30/2020 FINDINGS: Frontal and lateral views of the chest demonstrate postsurgical changes from prior median sternotomy. The cardiac silhouette is unremarkable. No acute airspace disease, effusion, or pneumothorax. No acute bony abnormalities. IMPRESSION: 1. No acute intrathoracic process. Electronically Signed   By: Randa Ngo M.D.   On: 04/05/2021 15:06   ECHOCARDIOGRAM COMPLETE  Result Date: 04/06/2021    ECHOCARDIOGRAM REPORT   Patient Name:   [name] Date of Exam: 04/06/2021 Medical Rec #:  [id number]      Height:       69.0 in Accession #:    [id number]     Weight:       181.7 lb Date of Birth:  1946/01/29      BSA:          1.983 m Patient Age:    37 years       BP:           185/61 mmHg Patient Gender: M              HR:           91 bpm. Exam Location:  [facility] Procedure: 2D Echo, Cardiac Doppler and Color Doppler  Indications:    Chest Pain  History:        Patient has prior history of Echocardiogram examinations, most                 recent 05/09/2020. CHF, Previous Myocardial Infarction and CAD,                 Prior Cardiac Surgery and Prior CABG, COPD, Signs/Symptoms:Chest                 Pain; Risk Factors:Hypertension, Former Smoker and Dyslipidemia.  Sonographer:    Wenda Low Referring Phys: [id number] [place]  1. Left ventricular ejection fraction, by estimation, is 55 to 60%. The left ventricle has normal function. The left ventricle has no regional wall motion abnormalities. There is severe left ventricular hypertrophy. Left ventricular diastolic parameters  are indeterminate.  2. Right ventricular systolic function is normal. The right ventricular size is normal. There is normal pulmonary artery systolic pressure.  3. The mitral valve is normal in structure. No evidence of  mitral valve regurgitation. No evidence of mitral stenosis.  4. The aortic valve has an indeterminant number of cusps. There is severe calcifcation of the aortic valve. There is severe thickening of the aortic valve. Aortic valve regurgitation is mild. Mild to moderate aortic valve stenosis.  5. The inferior vena cava is normal in size with greater than 50% respiratory variability, suggesting right atrial pressure of 3 mmHg. FINDINGS  Left Ventricle: Left ventricular ejection fraction, by estimation, is 55 to 60%. The left ventricle has normal function. The left ventricle has no regional wall motion abnormalities. The left ventricular internal cavity size was normal in size. There is  severe left ventricular hypertrophy. Left ventricular diastolic parameters are indeterminate. Right Ventricle: The right ventricular size is normal. No increase in right ventricular wall thickness. Right ventricular systolic function is normal. There is normal pulmonary artery systolic pressure. The tricuspid regurgitant velocity is 1.70  m/s, and  with an assumed right atrial pressure of 3 mmHg, the estimated right ventricular systolic pressure is 40.1 mmHg. Left Atrium: Left atrial size was normal in size. Right Atrium: Right atrial size was normal in size. Pericardium: There is no evidence of pericardial effusion. Mitral Valve: The mitral valve is normal in structure. No evidence of mitral valve regurgitation. No evidence of mitral valve stenosis. MV peak gradient, 5.0 mmHg. The mean mitral valve gradient is 2.0 mmHg. Tricuspid Valve: The tricuspid valve is normal in structure. Tricuspid valve regurgitation is not demonstrated. No evidence of tricuspid stenosis. Aortic Valve: The aortic valve has an indeterminant number of cusps. There is severe calcifcation of the aortic valve. There is severe thickening of the aortic valve. There is severe aortic valve annular calcification. Aortic valve regurgitation is mild.  Aortic regurgitation PHT measures 376 msec. Mild to moderate aortic stenosis is present. Aortic valve mean gradient measures 17.2 mmHg. Aortic valve peak gradient measures 30.7 mmHg. Aortic valve area, by VTI measures 1.20 cm. Pulmonic Valve: The pulmonic valve was not well visualized. Pulmonic valve regurgitation is not visualized. No evidence of pulmonic stenosis. Aorta: The aortic root is normal in size and structure. Venous: The inferior vena cava is normal in size with greater than 50% respiratory variability, suggesting right atrial pressure of 3 mmHg. IAS/Shunts: No atrial level shunt detected by color flow Doppler.  LEFT VENTRICLE PLAX 2D LVIDd:         3.70 cm     Diastology LVIDs:         2.75 cm     LV e' medial:    5.77 cm/s LV PW:         1.50 cm     LV E/e' medial:  14.9 LV IVS:        1.50 cm     LV e' lateral:   8.38 cm/s LVOT diam:     2.00 cm     LV E/e' lateral: 10.3 LV SV:         61 LV SV Index:   31 LVOT Area:     3.14 cm  LV Volumes (MOD) LV vol d, MOD A2C: 66.1 ml LV vol d, MOD A4C: 54.4 ml LV vol s, MOD A2C:  34.6 ml LV vol s, MOD A4C: 26.9 ml LV SV MOD A2C:     31.5 ml LV SV MOD A4C:     54.4 ml LV SV MOD BP:      28.7 ml RIGHT VENTRICLE RV Basal diam:  3.30 cm RV Mid diam:  2.30 cm LEFT ATRIUM             Index        RIGHT ATRIUM           Index LA diam:        3.50 cm 1.76 cm/m   RA Area:     18.30 cm LA Vol (A2C):   76.8 ml 38.72 ml/m  RA Volume:   45.90 ml  23.14 ml/m LA Vol (A4C):   66.0 ml 33.28 ml/m LA Biplane Vol: 72.1 ml 36.35 ml/m  AORTIC VALVE                     PULMONIC VALVE AV Area (Vmax):    1.04 cm      PV Vmax:       1.08 m/s AV Area (Vmean):   1.12 cm      PV Peak grad:  4.7 mmHg AV Area (VTI):     1.20 cm AV Vmax:           277.00 cm/s AV Vmean:          193.000 cm/s AV VTI:            0.507 m AV Peak Grad:      30.7 mmHg AV Mean Grad:      17.2 mmHg LVOT Vmax:         92.00 cm/s LVOT Vmean:        69.000 cm/s LVOT VTI:          0.193 m LVOT/AV VTI ratio: 0.38 AI PHT:            376 msec  AORTA Ao Root diam: 3.30 cm Ao Asc diam:  3.10 cm MITRAL VALVE                TRICUSPID VALVE MV Area (PHT): 4.06 cm     TR Peak grad:   11.6 mmHg MV Area VTI:   2.77 cm     TR Vmax:        170.00 cm/s MV Peak grad:  5.0 mmHg MV Mean grad:  2.0 mmHg     SHUNTS MV Vmax:       1.12 m/s     Systemic VTI:  0.19 m MV Vmean:      59.3 cm/s    Systemic Diam: 2.00 cm MV Decel Time: 187 msec MV E velocity: 86.10 cm/s MV A velocity: 104.00 cm/s MV E/A ratio:  0.83 Carlyle Dolly MD Electronically signed by Carlyle Dolly MD Signature Date/Time: 04/06/2021/4:28:18 PM    Final    US Abdomen Limited RUQ (LIVER/GB)  Result Date: 04/06/2021 CLINICAL DATA:  75 year old male with a history of right upper quadrant pain EXAM: ULTRASOUND ABDOMEN LIMITED RIGHT UPPER QUADRANT COMPARISON:  CT 04/05/2021, ultrasound 02/07/2020 FINDINGS: Gallbladder: Gallbladder distended. The thickness of the near field wall measures 3 mm. Echogenic debris/stones at the gallbladder fundus, compatible with prior CT. Sonographic Murphy  sign labeled as negative. Common bile duct: Diameter: Common bile duct measures 5 mm. Liver: No focal lesion identified. Within normal limits in parenchymal echogenicity. Portal vein is patent on color Doppler imaging with normal direction of blood flow towards the liver. Other: Incidental imaging of renal cysts which were present on the comparison CT. IMPRESSION: Ultrasound is equivocal for acute cholecystitis, as there is significant gallbladder distension, evidence of cholelithiasis, however, sonographic Murphy's sign is negative. Electronically Signed   By: Corrie Mckusick D.O.   On:  04/06/2021 10:51    alum & mag hydroxide-simeth  30 mL Oral Once   And   lidocaine  15 mL Oral Once   atorvastatin  80 mg Oral q1800   carvedilol  3.125 mg Oral BID   Chlorhexidine Gluconate Cloth  6 each Topical Q0600   cholecalciferol  1,000 Units Oral Daily   clopidogrel  75 mg Oral Daily   finasteride  5 mg Oral Daily   furosemide  80 mg Oral Q T,Th,S,Su   isosorbide mononitrate  120 mg Oral Daily   levothyroxine  50 mcg Oral QAC breakfast   omega-3 acid ethyl esters  1 g Oral BID   pantoprazole (PROTONIX) IV  40 mg Intravenous Q12H   tamsulosin  0.4 mg Oral Daily    BMET    Component Value Date/Time   NA 132 (L) 04/07/2021 0623   K 4.8 04/07/2021 0623   CL 92 (L) 04/07/2021 0623   CO2 23 04/07/2021 0623   GLUCOSE 91 04/07/2021 0623   BUN 59 (H) 04/07/2021 0623   CREATININE 7.77 (H) 04/07/2021 0623   CALCIUM 9.1 04/07/2021 0623   CALCIUM 8.5 (L) 04/24/2020 1112   GFRNONAA 7 (L) 04/07/2021 0623   GFRAA 19 (L) 02/11/2020 0849   CBC    Component Value Date/Time   WBC 14.8 (H) 04/07/2021 0623   RBC 3.98 (L) 04/07/2021 0623   HGB 12.3 (L) 04/07/2021 0623   HCT 38.0 (L) 04/07/2021 0623   PLT 164 04/07/2021 0623   MCV 95.5 04/07/2021 0623   MCH 30.9 04/07/2021 0623   MCHC 32.4 04/07/2021 0623   RDW 15.9 (H) 04/07/2021 0623   LYMPHSABS 0.8 08/30/2020 0449   MONOABS 0.5 08/30/2020 0449    EOSABS 0.1 08/30/2020 0449   BASOSABS 0.0 08/30/2020 0449   Dialysis Orders: Center: [facility]  on MWF . EDW 83 kg HD Bath 2K/2.5Ca  Time 4 hours Heparin none. Access  RUE AVF BFR 400 DFR 500    Calcitriol 0.5 mcg po/HD Epogen 5000   Units IV/HD  Venofer  100 mg IV/HD last dose for 04/07/21    Assessment/Plan:  Abdominal pain associated with N/V - CT with choledocholithiasis and + Murphy's sign.  Surgery has been consulted.  RUQ [place] equivocal for acute cholecystitis with significant gallbladder distension, cholelithiasis, negative Murphy's sign.  Per Surgery, need to hold off on surgery given rising troponins and cardiac issues. Rising troponins - Cardiology following and recommending heparin for 48 hours and continue to monitor for another 24 hours. Complex cardiac anatomy and holding off on cath for now.    ESRD -  will plan for HD today to keep on MWF schedule.  Hypertension/volume  - elevated BP, UF with HD.  He is at his EDW.  Anemia  - stable, hold ESA  Metabolic bone disease -  resume home meds when taking po  Nutrition - per primary CAD s/p CABG - Cardiology consulted due to ST and T wave abnormalities.  Donetta Potts, MD [employer] [phone]

## 2021-04-07 NOTE — Progress Notes (Signed)
ANTICOAGULATION CONSULT NOTE   Pharmacy Consult for Heparin Indication: chest pain/ACS  Allergies  Allergen Reactions   Cardizem Cd [Diltiazem Hcl Er Beads] Palpitations    "Makes heart skip"   Cardizem [Diltiazem]     Patient Measurements: Height: 5\' 9"  (175.3 cm) Weight: 84.5 kg (186 lb 4.6 oz) IBW/kg (Calculated) : 70.7 HEPARIN DW (KG): 83   Vital Signs: Temp: 99.6 F (37.6 C) (11/23 2029) Temp Source: Oral (11/23 1641) BP: 101/50 (11/23 2029) Pulse Rate: 104 (11/23 2029)  Labs: Recent Labs    04/05/21 1446 04/05/21 1643 04/06/21 0023 04/06/21 0237 04/06/21 0614 04/06/21 0809 04/06/21 1138 04/07/21 0623 04/07/21 1540 04/07/21 2310  HGB 12.1*  --  13.7  --   --   --   --  12.3*  --   --   HCT 37.4*  --  42.6  --   --   --   --  38.0*  --   --   PLT 179  --  162  --   --   --   --  164  --   --   APTT  --   --  34  --   --   --   --   --   --   --   HEPARINUNFRC  --   --   --   --   --   --    < > 0.26* 0.30 0.26*  CREATININE 4.63*  --  5.18*  --   --   --   --  7.77*  --   --   TROPONINIHS 25*   < > 89*   < > 328* 653*  --  2,885*  --   --    < > = values in this interval not displayed.     Estimated Creatinine Clearance: 8.3 mL/min (A) (by C-G formula based on SCr of 7.77 mg/dL (H)).    Assessment: 75 y.o. male on heparin for ACS. Cards wants to monitor for 24 hours on heparin with no cath planned at this time.  Heparin level slightly subtherapeutic (0.26) on gtt at 1550 units/hr. No issues with line or bleeding reported per RN.  Goal of Therapy:  Heparin level 0.3-0.7 units/ml Monitor platelets by anticoagulation protocol: Yes   Plan:  Increase heparin infusion to 1700 units/hr Check heparin level in 8 hours    Sherlon Handing, PharmD, BCPS Please see amion for complete clinical pharmacist phone list 04/07/2021 11:54 PM

## 2021-04-07 NOTE — Progress Notes (Signed)
ANTICOAGULATION CONSULT NOTE   Pharmacy Consult for Heparin Indication: chest pain/ACS  Allergies  Allergen Reactions   Cardizem Cd [Diltiazem Hcl Er Beads] Palpitations    "Makes heart skip"   Cardizem [Diltiazem]     Patient Measurements: Height: 5\' 9"  (175.3 cm) Weight: 84.5 kg (186 lb 4.6 oz) IBW/kg (Calculated) : 70.7 HEPARIN DW (KG): 83   Vital Signs: Temp: 98.2 F (36.8 C) (11/23 1225) Temp Source: Oral (11/23 1225) BP: 120/70 (11/23 1530) Pulse Rate: 109 (11/23 1530)  Labs: Recent Labs    04/05/21 1446 04/05/21 1643 04/06/21 0023 04/06/21 0237 04/06/21 0614 04/06/21 0809 04/06/21 1138 04/06/21 2155 04/07/21 0623 04/07/21 1540  HGB 12.1*  --  13.7  --   --   --   --   --  12.3*  --   HCT 37.4*  --  42.6  --   --   --   --   --  38.0*  --   PLT 179  --  162  --   --   --   --   --  164  --   APTT  --   --  34  --   --   --   --   --   --   --   HEPARINUNFRC  --   --   --   --   --   --    < > 0.33 0.26* 0.30  CREATININE 4.63*  --  5.18*  --   --   --   --   --  7.77*  --   TROPONINIHS 25*   < > 89*   < > 328* 653*  --   --  2,885*  --    < > = values in this interval not displayed.     Estimated Creatinine Clearance: 8.3 mL/min (A) (by C-G formula based on SCr of 7.77 mg/dL (H)).   Medical History: Past Medical History:  Diagnosis Date   Acute myocardial infarction of other inferior wall, initial episode of care    Anemia    Anginal pain ([place])    Anxiety    Arthritis    Cancer ([place])    bladder 2013 removed, has cystoscopy 10/2016, has returned x 2   COPD (chronic obstructive pulmonary disease) ([place])    Coronary artery disease    Artery bypass graft Jan 2008   Dialysis patient [facility])    Dyspnea    Dysrhythmia    GERD (gastroesophageal reflux disease)    Heart murmur    History of hiatal hernia    Hyperlipidemia    Hypertension    Hypothyroidism    PONV (postoperative nausea and vomiting)    Renal disorder    Sleep apnea    Stroke ([place])     Tobacco user     Medications:  No current facility-administered medications on file prior to encounter.   Current Outpatient Medications on File Prior to Encounter  Medication Sig Dispense Refill   acetaminophen (TYLENOL) 325 MG tablet Take 2 tablets (650 mg total) by mouth every 6 (six) hours as needed for mild pain.     allopurinol (ZYLOPRIM) 100 MG tablet Take 100 mg by mouth daily.      atorvastatin (LIPITOR) 80 MG tablet Take 80 mg by mouth daily.     carvedilol (COREG) 3.125 MG tablet Take 1 tablet (3.125 mg total) by mouth 2 (two) times daily. 60 tablet 3   clopidogrel (PLAVIX) 75 MG tablet  Take 75 mg by mouth daily.     finasteride (PROSCAR) 5 MG tablet Take 5 mg by mouth daily.     folic acid (FOLVITE) 1 MG tablet Take 1 mg by mouth daily.     furosemide (LASIX) 80 MG tablet Take 1 tablet (80 mg total) by mouth every Tuesday, Thursday, Saturday, and Sunday.     isosorbide mononitrate (IMDUR) 120 MG 24 hr tablet Take 1 tablet (120 mg total) by mouth daily. (Patient taking differently: Take 240 mg by mouth daily.) 30 tablet 5   levothyroxine (SYNTHROID, LEVOTHROID) 50 MCG tablet Take 50 mcg by mouth daily before breakfast.     nitroGLYCERIN (NITROSTAT) 0.4 MG SL tablet Place 0.4 mg under the tongue every 5 (five) minutes as needed for chest pain (max 3 doses).     Omega-3 Fatty Acids (RA FISH OIL) 1000 MG CAPS Take 2,000 mg by mouth 2 (two) times daily.     omeprazole (PRILOSEC) 20 MG capsule Take 20 mg by mouth 2 (two) times daily.     tamsulosin (FLOMAX) 0.4 MG CAPS capsule Take 0.4 mg by mouth daily.     Vitamin D, Cholecalciferol, 25 MCG (1000 UT) CAPS Take 1,000 Units by mouth daily.     epoetin alfa (EPOGEN) 20000 UNIT/ML injection Inject 20,000 Units into the skin every Monday. (Patient not taking: Reported on 04/06/2021)     hydrALAZINE (APRESOLINE) 100 MG tablet Take 1 tablet (100 mg total) by mouth 3 (three) times daily. (Patient not taking: Reported on 04/06/2021) 270  tablet 9   magnesium oxide (MAG-OX) 400 MG tablet Take 1 tablet (400 mg total) by mouth daily. (Patient not taking: Reported on 04/06/2021) 30 tablet 2     Assessment: 75 y.o. male admitted with RUQ/epigastric pain which lasted for 12+ hours and experienced associated nausea and vomiting with this.. He has elevated troponin, possible ACS, for heparin  HL 0.3, therapeutic, no issues with infusion. On low end, will increase dose slightly  Goal of Therapy:  Heparin level 0.3-0.7 units/ml Monitor platelets by anticoagulation protocol: Yes   Plan:  Increase heparin infusion to 1550 units/hr Check heparin level in 8 hours and daily while on heparin Monitor for s/s of bleeding   Isac Sarna, BS [place] [place], BCPS Clinical Pharmacist Pager [phone]

## 2021-04-07 NOTE — Progress Notes (Signed)
Subjective: Patient tolerating regular diet well.  Still with intermittent epigastric pain, but no right upper quadrant abdominal pain.  Objective: Vital signs in last 24 hours: Temp:  [98 F (36.7 C)-99.4 F (37.4 C)] 99.4 F (37.4 C) (11/23 0556) Pulse Rate:  [91-106] 91 (11/23 0556) Resp:  [18] 18 (11/23 0556) BP: (101-120)/(50-68) 111/50 (11/23 0556) SpO2:  [90 %-98 %] 90 % (11/23 0556) Last BM Date: 04/06/21  Intake/Output from previous day: 11/22 0701 - 11/23 0700 In: 1440 [P.O.:1440] Out: -  Intake/Output this shift: No intake/output data recorded.  General appearance: alert, cooperative, and no distress GI: soft, non-tender; bowel sounds normal; no masses,  no organomegaly  Lab Results:  Recent Labs    04/06/21 0023 04/07/21 0623  WBC 6.5 14.8*  HGB 13.7 12.3*  HCT 42.6 38.0*  PLT 162 164   BMET Recent Labs    04/06/21 0023 04/07/21 0623  NA 134* 132*  K 3.8 4.8  CL 92* 92*  CO2 26 23  GLUCOSE 126* 91  BUN 37* 59*  CREATININE 5.18* 7.77*  CALCIUM 9.1 9.1   PT/INR No results for input(s): LABPROT, INR in the last 72 hours.  Studies/Results: CT ABDOMEN PELVIS WO CONTRAST  Result Date: 04/05/2021 CLINICAL DATA:  Possible abscess/ infection Nausea/ vomitting EXAM: CT ABDOMEN AND PELVIS WITHOUT CONTRAST TECHNIQUE: Multidetector CT imaging of the abdomen and pelvis was performed following the standard protocol without IV contrast. COMPARISON:  None. FINDINGS: Lower chest: No acute abnormality. Aortic valve leaflet calcifications. Coronary artery calcifications. Sternotomy wires noted. Hepatobiliary: No focal liver abnormality. Calcified gallstone noted within the gallbladder lumen. Question pericholecystic fluid. Slight haziness of the gallbladder wall. No definite gallbladder wall thickening no biliary dilatation. Pancreas: No focal lesion. Normal pancreatic contour. No surrounding inflammatory changes. No main pancreatic ductal dilatation. Spleen:  Normal in size without focal abnormality. Adrenals/Urinary Tract: There is a 2.4 cm right adrenal gland nodule with a density of -10 Hounsfield units consistent with nodule adenoma. Bilateral renal atrophy. Multiple fluid density lesions likely represent simple renal cysts with the largest on the right measuring up to 8.3 cm. Subcentimeter densities are too small to characterize. There is a 1.3 cm lesion with a density of 26 Hounsfield units that is indeterminate (2:39) in the left kidney. Calcifications associated with kidneys appear to be vascular. No nephrolithiasis and no hydronephrosis. No definite contour-deforming renal mass. No ureterolithiasis or hydroureter. The urinary bladder is unremarkable. Stomach/Bowel: Stomach is within normal limits. No evidence of bowel wall thickening or dilatation. Scattered colonic diverticulosis. The appendix not definitely identified. Vascular/Lymphatic: No abdominal aorta or iliac aneurysm. Severe atherosclerotic plaque of the aorta and its branches. No abdominal, pelvic, or inguinal lymphadenopathy. Reproductive: Prostate is unremarkable. Other: No intraperitoneal free fluid. No intraperitoneal free gas. No organized fluid collection. Musculoskeletal: Status post right repair with. Likely left repair. No definite inguinal hernia repair. No suspicious lytic or blastic osseous lesions. No acute displaced fracture. Multilevel degenerative changes of the spine. IMPRESSION: 1. Cholelithiasis with question pericholecystic fluid and slight haziness of the gallbladder wall. Recommend right upper quadrant ultrasound for a more sensitive evaluation of the gallbladder. 2. Indeterminate 1.3 cm left renal lesion. Recommend nonemergent MRI renal protocol for further evaluation. When the patient is clinically stable and able to follow directions and hold their breath (preferably as an outpatient) further evaluation with dedicated abdominal MRI should be considered. 3. Colonic  diverticulosis with no acute diverticulitis. 4. 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 5.  Aortic Atherosclerosis (  ICD10-I70.0)-severe. Electronically Signed   By: Iven Finn M.D.   On: 04/05/2021 19:18   DG Chest 2 View  Result Date: 04/05/2021 CLINICAL DATA:  Chest pain for 45 minutes, central chest pain and epigastric pain EXAM: CHEST - 2 VIEW COMPARISON:  08/30/2020 FINDINGS: Frontal and lateral views of the chest demonstrate postsurgical changes from prior median sternotomy. The cardiac silhouette is unremarkable. No acute airspace disease, effusion, or pneumothorax. No acute bony abnormalities. IMPRESSION: 1. No acute intrathoracic process. Electronically Signed   By: Randa Ngo M.D.   On: 04/05/2021 15:06   ECHOCARDIOGRAM COMPLETE  Result Date: 04/06/2021    ECHOCARDIOGRAM REPORT   Patient Name:   [name] Date of Exam: 04/06/2021 Medical Rec #:  [id number]      Height:       69.0 in Accession #:    [id number]     Weight:       181.7 lb Date of Birth:  1945/05/18      BSA:          1.983 m Patient Age:    75 years       BP:           185/61 mmHg Patient Gender: M              HR:           91 bpm. Exam Location:  [facility] Procedure: 2D Echo, Cardiac Doppler and Color Doppler Indications:    Chest Pain  History:        Patient has prior history of Echocardiogram examinations, most                 recent 05/09/2020. CHF, Previous Myocardial Infarction and CAD,                 Prior Cardiac Surgery and Prior CABG, COPD, Signs/Symptoms:Chest                 Pain; Risk Factors:Hypertension, Former Smoker and Dyslipidemia.  Sonographer:    Wenda Low Referring Phys: [id number] [place]  1. Left ventricular ejection fraction, by estimation, is 55 to 60%. The left ventricle has normal function. The left ventricle has no regional wall motion abnormalities. There is severe left ventricular hypertrophy. Left ventricular diastolic parameters  are indeterminate.  2. Right  ventricular systolic function is normal. The right ventricular size is normal. There is normal pulmonary artery systolic pressure.  3. The mitral valve is normal in structure. No evidence of mitral valve regurgitation. No evidence of mitral stenosis.  4. The aortic valve has an indeterminant number of cusps. There is severe calcifcation of the aortic valve. There is severe thickening of the aortic valve. Aortic valve regurgitation is mild. Mild to moderate aortic valve stenosis.  5. The inferior vena cava is normal in size with greater than 50% respiratory variability, suggesting right atrial pressure of 3 mmHg. FINDINGS  Left Ventricle: Left ventricular ejection fraction, by estimation, is 55 to 60%. The left ventricle has normal function. The left ventricle has no regional wall motion abnormalities. The left ventricular internal cavity size was normal in size. There is  severe left ventricular hypertrophy. Left ventricular diastolic parameters are indeterminate. Right Ventricle: The right ventricular size is normal. No increase in right ventricular wall thickness. Right ventricular systolic function is normal. There is normal pulmonary artery systolic pressure. The tricuspid regurgitant velocity is 1.70 m/s, and  with an assumed right atrial pressure of 3  mmHg, the estimated right ventricular systolic pressure is 95.6 mmHg. Left Atrium: Left atrial size was normal in size. Right Atrium: Right atrial size was normal in size. Pericardium: There is no evidence of pericardial effusion. Mitral Valve: The mitral valve is normal in structure. No evidence of mitral valve regurgitation. No evidence of mitral valve stenosis. MV peak gradient, 5.0 mmHg. The mean mitral valve gradient is 2.0 mmHg. Tricuspid Valve: The tricuspid valve is normal in structure. Tricuspid valve regurgitation is not demonstrated. No evidence of tricuspid stenosis. Aortic Valve: The aortic valve has an indeterminant number of cusps. There is severe  calcifcation of the aortic valve. There is severe thickening of the aortic valve. There is severe aortic valve annular calcification. Aortic valve regurgitation is mild.  Aortic regurgitation PHT measures 376 msec. Mild to moderate aortic stenosis is present. Aortic valve mean gradient measures 17.2 mmHg. Aortic valve peak gradient measures 30.7 mmHg. Aortic valve area, by VTI measures 1.20 cm. Pulmonic Valve: The pulmonic valve was not well visualized. Pulmonic valve regurgitation is not visualized. No evidence of pulmonic stenosis. Aorta: The aortic root is normal in size and structure. Venous: The inferior vena cava is normal in size with greater than 50% respiratory variability, suggesting right atrial pressure of 3 mmHg. IAS/Shunts: No atrial level shunt detected by color flow Doppler.  LEFT VENTRICLE PLAX 2D LVIDd:         3.70 cm     Diastology LVIDs:         2.75 cm     LV e' medial:    5.77 cm/s LV PW:         1.50 cm     LV E/e' medial:  14.9 LV IVS:        1.50 cm     LV e' lateral:   8.38 cm/s LVOT diam:     2.00 cm     LV E/e' lateral: 10.3 LV SV:         61 LV SV Index:   31 LVOT Area:     3.14 cm  LV Volumes (MOD) LV vol d, MOD A2C: 66.1 ml LV vol d, MOD A4C: 54.4 ml LV vol s, MOD A2C: 34.6 ml LV vol s, MOD A4C: 26.9 ml LV SV MOD A2C:     31.5 ml LV SV MOD A4C:     54.4 ml LV SV MOD BP:      28.7 ml RIGHT VENTRICLE RV Basal diam:  3.30 cm RV Mid diam:    2.30 cm LEFT ATRIUM             Index        RIGHT ATRIUM           Index LA diam:        3.50 cm 1.76 cm/m   RA Area:     18.30 cm LA Vol (A2C):   76.8 ml 38.72 ml/m  RA Volume:   45.90 ml  23.14 ml/m LA Vol (A4C):   66.0 ml 33.28 ml/m LA Biplane Vol: 72.1 ml 36.35 ml/m  AORTIC VALVE                     PULMONIC VALVE AV Area (Vmax):    1.04 cm      PV Vmax:       1.08 m/s AV Area (Vmean):   1.12 cm      PV Peak grad:  4.7 mmHg AV Area (VTI):  1.20 cm AV Vmax:           277.00 cm/s AV Vmean:          193.000 cm/s AV VTI:             0.507 m AV Peak Grad:      30.7 mmHg AV Mean Grad:      17.2 mmHg LVOT Vmax:         92.00 cm/s LVOT Vmean:        69.000 cm/s LVOT VTI:          0.193 m LVOT/AV VTI ratio: 0.38 AI PHT:            376 msec  AORTA Ao Root diam: 3.30 cm Ao Asc diam:  3.10 cm MITRAL VALVE                TRICUSPID VALVE MV Area (PHT): 4.06 cm     TR Peak grad:   11.6 mmHg MV Area VTI:   2.77 cm     TR Vmax:        170.00 cm/s MV Peak grad:  5.0 mmHg MV Mean grad:  2.0 mmHg     SHUNTS MV Vmax:       1.12 m/s     Systemic VTI:  0.19 m MV Vmean:      59.3 cm/s    Systemic Diam: 2.00 cm MV Decel Time: 187 msec MV E velocity: 86.10 cm/s MV A velocity: 104.00 cm/s MV E/A ratio:  0.83 Carlyle Dolly MD Electronically signed by Carlyle Dolly MD Signature Date/Time: 04/06/2021/4:28:18 PM    Final    US Abdomen Limited RUQ (LIVER/GB)  Result Date: 04/06/2021 CLINICAL DATA:  75 year old male with a history of right upper quadrant pain EXAM: ULTRASOUND ABDOMEN LIMITED RIGHT UPPER QUADRANT COMPARISON:  CT 04/05/2021, ultrasound 02/07/2020 FINDINGS: Gallbladder: Gallbladder distended. The thickness of the near field wall measures 3 mm. Echogenic debris/stones at the gallbladder fundus, compatible with prior CT. Sonographic Murphy sign labeled as negative. Common bile duct: Diameter: Common bile duct measures 5 mm. Liver: No focal lesion identified. Within normal limits in parenchymal echogenicity. Portal vein is patent on color Doppler imaging with normal direction of blood flow towards the liver. Other: Incidental imaging of renal cysts which were present on the comparison CT. IMPRESSION: Ultrasound is equivocal for acute cholecystitis, as there is significant gallbladder distension, evidence of cholelithiasis, however, sonographic Murphy's sign is negative. Electronically Signed   By: Corrie Mckusick D.O.   On: 04/06/2021 10:51    Anti-infectives: Anti-infectives (From admission, onward)    None       Assessment/Plan: Impression:  Biliary colic secondary to cholelithiasis, resolved NSTEMI. Plan: No need for acute surgical intervention at this time.  Patient does not have evidence of acute cholecystitis.  Given his current cardiac status, any surgical invention would be high risk.  We will sign off.  Please call me if I can be of further assistance.  LOS: 2 days    Aviva Signs 04/07/2021

## 2021-04-07 NOTE — TOC Transition Note (Signed)
Transition of Care [facility]) - CM/SW Discharge Note   Patient Details  Name: [name] MRN: [id number] Date of Birth: 16-Jul-1945  Transition of Care [facility]) CM/SW Contact:  Boneta Lucks, RN Phone Number: 04/07/2021, 12:44 PM   Clinical Narrative:   Patient admitted with abdominal pain. Patient is agreeable to home health at discharge. Vaughan Basta accepted the referral for HHPT/RN. Patient is not medically ready. TOC to follow for orders.    Barriers to Discharge: Continued Medical Work up  Patient Goals and CMS Choice Patient states their goals for this hospitalization and ongoing recovery are:: to go home. CMS Medicare.gov Compare Post Acute Care list provided to:: Patient Choice offered to / list presented to : Patient  Discharge Placement        Discharge Plan and Services      [place]: [place] [facility]) Date [place]: 04/07/21 Time [place]: [id number] Representative spoke with at [place]: Romualdo Bolk   Readmission Risk Interventions Readmission Risk Prevention Plan 04/07/2021 08/09/2020  Transportation Screening Complete Complete  Medication Review Press photographer) Complete Complete  HRI or [place] Complete Complete  SW Recovery Care/Counseling Consult Complete Complete  Palliative Care Screening Not Applicable Not [place] Not Applicable Not Applicable  Some recent data might be hidden

## 2021-04-07 NOTE — Progress Notes (Signed)
Progress Note  Patient Name: [name] Date of Encounter: 04/07/2021  [facility] Cardiologist: Carlyle Dolly, MD   Subjective   Chest soreness that is positional  Inpatient Medications    Scheduled Meds:  alum & mag hydroxide-simeth  30 mL Oral Once   And   lidocaine  15 mL Oral Once   atorvastatin  80 mg Oral q1800   carvedilol  3.125 mg Oral BID   Chlorhexidine Gluconate Cloth  6 each Topical Q0600   cholecalciferol  1,000 Units Oral Daily   clopidogrel  75 mg Oral Daily   finasteride  5 mg Oral Daily   furosemide  80 mg Oral Q T,Th,S,Su   heparin  1,500 Units Intravenous Once   isosorbide mononitrate  120 mg Oral Daily   levothyroxine  50 mcg Oral QAC breakfast   omega-3 acid ethyl esters  1 g Oral BID   pantoprazole (PROTONIX) IV  40 mg Intravenous Q12H   tamsulosin  0.4 mg Oral Daily   Continuous Infusions:  heparin 1,300 Units/hr (04/06/21 2020)   PRN Meds: hydrALAZINE, HYDROmorphone (DILAUDID) injection, nitroGLYCERIN, ondansetron (ZOFRAN) IV, prochlorperazine   Vital Signs    Vitals:   04/06/21 0500 04/06/21 1405 04/06/21 2045 04/07/21 0556  BP:  101/67 120/68 (!) 111/50  Pulse:  (!) 106 100 91  Resp:  18 18 18   Temp:  98 F (36.7 C) 98.1 F (36.7 C) 99.4 F (37.4 C)  TempSrc:      SpO2:  97% 98% 90%  Weight: 82.4 kg     Height:        Intake/Output Summary (Last 24 hours) at 04/07/2021 0844 Last data filed at 04/07/2021 0500 Gross per 24 hour  Intake 1440 ml  Output --  Net 1440 ml   Last 3 Weights 04/06/2021 04/05/2021 09/04/2020  Weight (lbs) 181 lb 10.5 oz 183 lb 183 lb 3.2 oz  Weight (kg) 82.4 kg 83.008 kg 83.1 kg      Telemetry    NSR - Personally Reviewed  ECG    N/a - Personally Reviewed  Physical Exam   GEN: No acute distress.   Neck: No JVD Cardiac: RRR, no murmurs, rubs, or gallops.  Respiratory: Clear to auscultation bilaterally. GI: Soft, nontender, non-distended  MS: No edema; No deformity. Neuro:   Nonfocal  Psych: Normal affect   Labs    High Sensitivity Troponin:   Recent Labs  Lab 04/06/21 0023 04/06/21 0237 04/06/21 0614 04/06/21 0809 04/07/21 0623  TROPONINIHS 89* 150* 328* 653* 2,885*     Chemistry Recent Labs  Lab 04/05/21 1446 04/06/21 0023 04/07/21 0623  NA 137 134* 132*  K 3.8 3.8 4.8  CL 92* 92* 92*  CO2 30 26 23   GLUCOSE 95 126* 91  BUN 29* 37* 59*  CREATININE 4.63* 5.18* 7.77*  CALCIUM 8.8* 9.1 9.1  MG  --  1.7  --   PROT 7.8 8.7* 7.4  ALBUMIN 4.0 4.4 3.6  AST 20 24 28   ALT 12 13 13   ALKPHOS 81 95 80  BILITOT 0.6 0.8 1.0  GFRNONAA 13* 11* 7*  ANIONGAP 15 16* 17*    Lipids No results for input(s): CHOL, TRIG, HDL, LABVLDL, LDLCALC, CHOLHDL in the last 168 hours.  Hematology Recent Labs  Lab 04/05/21 1446 04/06/21 0023 04/07/21 0623  WBC 4.1 6.5 14.8*  RBC 3.77* 4.34 3.98*  HGB 12.1* 13.7 12.3*  HCT 37.4* 42.6 38.0*  MCV 99.2 98.2 95.5  MCH 32.1 31.6 30.9  MCHC 32.4 32.2 32.4  RDW 16.0* 15.9* 15.9*  PLT 179 162 164   Thyroid No results for input(s): TSH, FREET4 in the last 168 hours.  BNPNo results for input(s): BNP, PROBNP in the last 168 hours.  DDimer No results for input(s): DDIMER in the last 168 hours.   Radiology    CT ABDOMEN PELVIS WO CONTRAST  Result Date: 04/05/2021 CLINICAL DATA:  Possible abscess/ infection Nausea/ vomitting EXAM: CT ABDOMEN AND PELVIS WITHOUT CONTRAST TECHNIQUE: Multidetector CT imaging of the abdomen and pelvis was performed following the standard protocol without IV contrast. COMPARISON:  None. FINDINGS: Lower chest: No acute abnormality. Aortic valve leaflet calcifications. Coronary artery calcifications. Sternotomy wires noted. Hepatobiliary: No focal liver abnormality. Calcified gallstone noted within the gallbladder lumen. Question pericholecystic fluid. Slight haziness of the gallbladder wall. No definite gallbladder wall thickening no biliary dilatation. Pancreas: No focal lesion. Normal  pancreatic contour. No surrounding inflammatory changes. No main pancreatic ductal dilatation. Spleen: Normal in size without focal abnormality. Adrenals/Urinary Tract: There is a 2.4 cm right adrenal gland nodule with a density of -10 Hounsfield units consistent with nodule adenoma. Bilateral renal atrophy. Multiple fluid density lesions likely represent simple renal cysts with the largest on the right measuring up to 8.3 cm. Subcentimeter densities are too small to characterize. There is a 1.3 cm lesion with a density of 26 Hounsfield units that is indeterminate (2:39) in the left kidney. Calcifications associated with kidneys appear to be vascular. No nephrolithiasis and no hydronephrosis. No definite contour-deforming renal mass. No ureterolithiasis or hydroureter. The urinary bladder is unremarkable. Stomach/Bowel: Stomach is within normal limits. No evidence of bowel wall thickening or dilatation. Scattered colonic diverticulosis. The appendix not definitely identified. Vascular/Lymphatic: No abdominal aorta or iliac aneurysm. Severe atherosclerotic plaque of the aorta and its branches. No abdominal, pelvic, or inguinal lymphadenopathy. Reproductive: Prostate is unremarkable. Other: No intraperitoneal free fluid. No intraperitoneal free gas. No organized fluid collection. Musculoskeletal: Status post right repair with. Likely left repair. No definite inguinal hernia repair. No suspicious lytic or blastic osseous lesions. No acute displaced fracture. Multilevel degenerative changes of the spine. IMPRESSION: 1. Cholelithiasis with question pericholecystic fluid and slight haziness of the gallbladder wall. Recommend right upper quadrant ultrasound for a more sensitive evaluation of the gallbladder. 2. Indeterminate 1.3 cm left renal lesion. Recommend nonemergent MRI renal protocol for further evaluation. When the patient is clinically stable and able to follow directions and hold their breath (preferably as an  outpatient) further evaluation with dedicated abdominal MRI should be considered. 3. Colonic diverticulosis with no acute diverticulitis. 4. 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 5.  Aortic Atherosclerosis (ICD10-I70.0)-severe. Electronically Signed   By: Iven Finn M.D.   On: 04/05/2021 19:18   DG Chest 2 View  Result Date: 04/05/2021 CLINICAL DATA:  Chest pain for 45 minutes, central chest pain and epigastric pain EXAM: CHEST - 2 VIEW COMPARISON:  08/30/2020 FINDINGS: Frontal and lateral views of the chest demonstrate postsurgical changes from prior median sternotomy. The cardiac silhouette is unremarkable. No acute airspace disease, effusion, or pneumothorax. No acute bony abnormalities. IMPRESSION: 1. No acute intrathoracic process. Electronically Signed   By: Randa Ngo M.D.   On: 04/05/2021 15:06   ECHOCARDIOGRAM COMPLETE  Result Date: 04/06/2021    ECHOCARDIOGRAM REPORT   Patient Name:   JEREMIA GROOT Date of Exam: 04/06/2021 Medical Rec #:  [id number]      Height:       69.0 in Accession #:  [id number]     Weight:       181.7 lb Date of Birth:  1945/08/11      BSA:          1.983 m Patient Age:    64 years       BP:           185/61 mmHg Patient Gender: M              HR:           91 bpm. Exam Location:  [facility] Procedure: 2D Echo, Cardiac Doppler and Color Doppler Indications:    Chest Pain  History:        Patient has prior history of Echocardiogram examinations, most                 recent 05/09/2020. CHF, Previous Myocardial Infarction and CAD,                 Prior Cardiac Surgery and Prior CABG, COPD, Signs/Symptoms:Chest                 Pain; Risk Factors:Hypertension, Former Smoker and Dyslipidemia.  Sonographer:    Wenda Low Referring Phys: [id number] [place]  1. Left ventricular ejection fraction, by estimation, is 55 to 60%. The left ventricle has normal function. The left ventricle has no regional wall motion abnormalities. There is severe left  ventricular hypertrophy. Left ventricular diastolic parameters  are indeterminate.  2. Right ventricular systolic function is normal. The right ventricular size is normal. There is normal pulmonary artery systolic pressure.  3. The mitral valve is normal in structure. No evidence of mitral valve regurgitation. No evidence of mitral stenosis.  4. The aortic valve has an indeterminant number of cusps. There is severe calcifcation of the aortic valve. There is severe thickening of the aortic valve. Aortic valve regurgitation is mild. Mild to moderate aortic valve stenosis.  5. The inferior vena cava is normal in size with greater than 50% respiratory variability, suggesting right atrial pressure of 3 mmHg. FINDINGS  Left Ventricle: Left ventricular ejection fraction, by estimation, is 55 to 60%. The left ventricle has normal function. The left ventricle has no regional wall motion abnormalities. The left ventricular internal cavity size was normal in size. There is  severe left ventricular hypertrophy. Left ventricular diastolic parameters are indeterminate. Right Ventricle: The right ventricular size is normal. No increase in right ventricular wall thickness. Right ventricular systolic function is normal. There is normal pulmonary artery systolic pressure. The tricuspid regurgitant velocity is 1.70 m/s, and  with an assumed right atrial pressure of 3 mmHg, the estimated right ventricular systolic pressure is 68.3 mmHg. Left Atrium: Left atrial size was normal in size. Right Atrium: Right atrial size was normal in size. Pericardium: There is no evidence of pericardial effusion. Mitral Valve: The mitral valve is normal in structure. No evidence of mitral valve regurgitation. No evidence of mitral valve stenosis. MV peak gradient, 5.0 mmHg. The mean mitral valve gradient is 2.0 mmHg. Tricuspid Valve: The tricuspid valve is normal in structure. Tricuspid valve regurgitation is not demonstrated. No evidence of tricuspid  stenosis. Aortic Valve: The aortic valve has an indeterminant number of cusps. There is severe calcifcation of the aortic valve. There is severe thickening of the aortic valve. There is severe aortic valve annular calcification. Aortic valve regurgitation is mild.  Aortic regurgitation PHT measures 376 msec. Mild to moderate aortic stenosis is present. Aortic valve mean gradient  measures 17.2 mmHg. Aortic valve peak gradient measures 30.7 mmHg. Aortic valve area, by VTI measures 1.20 cm. Pulmonic Valve: The pulmonic valve was not well visualized. Pulmonic valve regurgitation is not visualized. No evidence of pulmonic stenosis. Aorta: The aortic root is normal in size and structure. Venous: The inferior vena cava is normal in size with greater than 50% respiratory variability, suggesting right atrial pressure of 3 mmHg. IAS/Shunts: No atrial level shunt detected by color flow Doppler.  LEFT VENTRICLE PLAX 2D LVIDd:         3.70 cm     Diastology LVIDs:         2.75 cm     LV e' medial:    5.77 cm/s LV PW:         1.50 cm     LV E/e' medial:  14.9 LV IVS:        1.50 cm     LV e' lateral:   8.38 cm/s LVOT diam:     2.00 cm     LV E/e' lateral: 10.3 LV SV:         61 LV SV Index:   31 LVOT Area:     3.14 cm  LV Volumes (MOD) LV vol d, MOD A2C: 66.1 ml LV vol d, MOD A4C: 54.4 ml LV vol s, MOD A2C: 34.6 ml LV vol s, MOD A4C: 26.9 ml LV SV MOD A2C:     31.5 ml LV SV MOD A4C:     54.4 ml LV SV MOD BP:      28.7 ml RIGHT VENTRICLE RV Basal diam:  3.30 cm RV Mid diam:    2.30 cm LEFT ATRIUM             Index        RIGHT ATRIUM           Index LA diam:        3.50 cm 1.76 cm/m   RA Area:     18.30 cm LA Vol (A2C):   76.8 ml 38.72 ml/m  RA Volume:   45.90 ml  23.14 ml/m LA Vol (A4C):   66.0 ml 33.28 ml/m LA Biplane Vol: 72.1 ml 36.35 ml/m  AORTIC VALVE                     PULMONIC VALVE AV Area (Vmax):    1.04 cm      PV Vmax:       1.08 m/s AV Area (Vmean):   1.12 cm      PV Peak grad:  4.7 mmHg AV Area (VTI):      1.20 cm AV Vmax:           277.00 cm/s AV Vmean:          193.000 cm/s AV VTI:            0.507 m AV Peak Grad:      30.7 mmHg AV Mean Grad:      17.2 mmHg LVOT Vmax:         92.00 cm/s LVOT Vmean:        69.000 cm/s LVOT VTI:          0.193 m LVOT/AV VTI ratio: 0.38 AI PHT:            376 msec  AORTA Ao Root diam: 3.30 cm Ao Asc diam:  3.10 cm MITRAL VALVE  TRICUSPID VALVE MV Area (PHT): 4.06 cm     TR Peak grad:   11.6 mmHg MV Area VTI:   2.77 cm     TR Vmax:        170.00 cm/s MV Peak grad:  5.0 mmHg MV Mean grad:  2.0 mmHg     SHUNTS MV Vmax:       1.12 m/s     Systemic VTI:  0.19 m MV Vmean:      59.3 cm/s    Systemic Diam: 2.00 cm MV Decel Time: 187 msec MV E velocity: 86.10 cm/s MV A velocity: 104.00 cm/s MV E/A ratio:  0.83 Carlyle Dolly MD Electronically signed by Carlyle Dolly MD Signature Date/Time: 04/06/2021/4:28:18 PM    Final    US Abdomen Limited RUQ (LIVER/GB)  Result Date: 04/06/2021 CLINICAL DATA:  75 year old male with a history of right upper quadrant pain EXAM: ULTRASOUND ABDOMEN LIMITED RIGHT UPPER QUADRANT COMPARISON:  CT 04/05/2021, ultrasound 02/07/2020 FINDINGS: Gallbladder: Gallbladder distended. The thickness of the near field wall measures 3 mm. Echogenic debris/stones at the gallbladder fundus, compatible with prior CT. Sonographic Murphy sign labeled as negative. Common bile duct: Diameter: Common bile duct measures 5 mm. Liver: No focal lesion identified. Within normal limits in parenchymal echogenicity. Portal vein is patent on color Doppler imaging with normal direction of blood flow towards the liver. Other: Incidental imaging of renal cysts which were present on the comparison CT. IMPRESSION: Ultrasound is equivocal for acute cholecystitis, as there is significant gallbladder distension, evidence of cholelithiasis, however, sonographic Murphy's sign is negative. Electronically Signed   By: Corrie Mckusick D.O.   On: 04/06/2021 10:51    Cardiac Studies      Patient Profile     ZAYLON BOSSIER is a 75 y.o. 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 Josephine Cables.  Assessment & Plan    Elevated troponin -CAD with CABG 2008 and 2018, history of prior PCI. Most recently NSTEMI 06/2017 at [facility] with chronic occluded LAD, 80% RCA, patent LIMA-LAD, patent LRA graft to distal RCA, SVG-OM/D1 patent. Occluded RIMA graft, chronic occluded SVG-rPL, acute occluded SVG-D2 thought culprit but poor PCI target with recs for medical therapy. Multiple admits following to [facility] with chest pains, managed medically due to his anatomy  - Admitted with abdominal pain. Burning pain 6/10 radiring into RUQ abdomen, associated with nausea and vomiting. Pain improves with vomiting.  - in this setting trop elevation to 2885, EKG with lateral TWIs - echo LVEF 55-60%, no WMAs   He has very complex anatomy with CABG on 2 separate occasions as well as prior interventinos, most recent admit 2019 with NSTEMI graft disease not amenable to PCI. I think chances of having new disease that could be intervened on is quite low. He also has had intermittent issues with GI bleeding requiring interuption of antiplatelets in the past and would be reluctant to have to commit to DAPT without interruption in absence of high risk presentation. Echo with normal LVEF and wall  motion. No currnet chest pains. Would monitor additional 24 hours on heparin, would not plan for cath at this time.    Medical  therapy with heparin gtt, atorva 80, coreg 3.125mg  bid, plavix 75. Would manage with heparin x 48 hours  For questions or updates, please contact Sebastian Please consult [email or web address] for contact info under        Signed, Carlyle Dolly, MD  04/07/2021, 8:44 AM

## 2021-04-08 ENCOUNTER — Other Ambulatory Visit: Payer: Self-pay

## 2021-04-08 DIAGNOSIS — Z992 Dependence on renal dialysis: Secondary | ICD-10-CM | POA: Diagnosis not present

## 2021-04-08 DIAGNOSIS — N186 End stage renal disease: Secondary | ICD-10-CM | POA: Diagnosis not present

## 2021-04-08 DIAGNOSIS — R778 Other specified abnormalities of plasma proteins: Secondary | ICD-10-CM | POA: Diagnosis not present

## 2021-04-08 DIAGNOSIS — I10 Essential (primary) hypertension: Secondary | ICD-10-CM | POA: Diagnosis not present

## 2021-04-08 LAB — CBC
HCT: 36 % — ABNORMAL LOW (ref 39.0–52.0)
Hemoglobin: 11.9 g/dL — ABNORMAL LOW (ref 13.0–17.0)
MCH: 32.5 pg (ref 26.0–34.0)
MCHC: 33.1 g/dL (ref 30.0–36.0)
MCV: 98.4 fL (ref 80.0–100.0)
Platelets: 177 10*3/uL (ref 150–400)
RBC: 3.66 MIL/uL — ABNORMAL LOW (ref 4.22–5.81)
RDW: 15.9 % — ABNORMAL HIGH (ref 11.5–15.5)
WBC: 11.2 10*3/uL — ABNORMAL HIGH (ref 4.0–10.5)
nRBC: 0 % (ref 0.0–0.2)

## 2021-04-08 LAB — RENAL FUNCTION PANEL
Albumin: 3.3 g/dL — ABNORMAL LOW (ref 3.5–5.0)
Anion gap: 13 (ref 5–15)
BUN: 42 mg/dL — ABNORMAL HIGH (ref 8–23)
CO2: 25 mmol/L (ref 22–32)
Calcium: 8.8 mg/dL — ABNORMAL LOW (ref 8.9–10.3)
Chloride: 93 mmol/L — ABNORMAL LOW (ref 98–111)
Creatinine, Ser: 6.59 mg/dL — ABNORMAL HIGH (ref 0.61–1.24)
GFR, Estimated: 8 mL/min — ABNORMAL LOW (ref >60)
Glucose, Bld: 89 mg/dL (ref 70–99)
Phosphorus: 5.2 mg/dL — ABNORMAL HIGH (ref 2.5–4.6)
Potassium: 4.2 mmol/L (ref 3.5–5.1)
Sodium: 131 mmol/L — ABNORMAL LOW (ref 135–145)

## 2021-04-08 LAB — TROPONIN I (HIGH SENSITIVITY)
Troponin I (High Sensitivity): 3548 ng/L
Troponin I (High Sensitivity): 3396 ng/L
Troponin I (High Sensitivity): 3684 ng/L (ref <18)

## 2021-04-08 LAB — HEPARIN LEVEL (UNFRACTIONATED)
Heparin Unfractionated: 0.29 [IU]/mL — ABNORMAL LOW (ref 0.30–0.70)
Heparin Unfractionated: 0.31 IU/mL (ref 0.30–0.70)

## 2021-04-08 LAB — HEPATITIS B SURFACE ANTIBODY, QUANTITATIVE: Hep B S AB Quant (Post): <3.1 m[IU]/mL — ABNORMAL LOW

## 2021-04-08 MED ORDER — CALCITRIOL 0.25 MCG PO CAPS
0.5000 ug | ORAL_CAPSULE | ORAL | Status: DC
  Administered 2021-04-09: 0.5 ug via ORAL
  Filled 2021-04-08: qty 2

## 2021-04-08 MED ORDER — CHLORHEXIDINE GLUCONATE CLOTH 2 % EX PADS
6.0000 | MEDICATED_PAD | Freq: Every day | CUTANEOUS | Status: DC
  Administered 2021-04-09: 6 via TOPICAL

## 2021-04-08 NOTE — Progress Notes (Addendum)
PROGRESS NOTE  [name] OIN:[id number] DOB: [birth year] DOA: 04/05/2021 PCP: [facility]   Brief History:  75 year old male with a history of end-stage renal disease on hemodialysis, hypertension, coronary artery disease, CHF, BPH, presents to the hospital with abdominal pain.  Reports epigastric pain with slight radiation to the right and was associated with nausea and vomiting.  Initial concern was for biliary source, but imaging was not convincing for cholecystitis.  Clinically, overall he has improved and is now tolerating a diet without pain or vomiting.  Suspect that he may have had biliary colic which is passed.  Was also noted to have elevated troponins.  Seen by cardiology with decision to treat conservatively.  He is on medical management.  Nephrology following for dialysis needs.   Assessment/Plan: Abdominal pain/Biliary Colic -Possibly secondary to biliary colic -Clinically, appears to have resolved -11/21 CT abd--Cholelithiasis with question pericholecystic fluid and slight haziness of the gallbladder wall -Ultrasound right upper quadrant was equivocal -Patient is tolerating diet at this time without worsening pain or vomiting -LFTs unremarkable -Seen by general surgery, no indication for surgery at this time--case discussed with Dr. Arnoldo Morale -Can be followed as needed as an outpatient -continue IV protonix -diet advanced which patient tolerated   Elevated troponin  -Troponin up to 328>>2885>>3548>>3396>>3684 -Continue on IV heparin infusion -Cardiology following -Patient with difficult coronary anatomy status post CABG.  Most recent cardiac cath in 2019 did not show any amenable targets for intervention -Patient is also had difficulty coming to [facility] due to issues with GI bleeding -11/22 Echo EF 55-60,  no WMA -11/24--discussed with Dr. Harl Bowie due to uptrend troponin>>continue IV heparin and repeat troponin in am -Continue statin, Coreg,  Plavix -patient remains cp free   End-stage renal disease on hemodialysis -Nephrology following -Last Dialysis 11/23 -plan for HD 11/25   Hypertension -Currently on Coreg, Imdur, as needed hydralazine -Blood pressure currently stable -Continue to use IV hydralazine as needed   Incidental finding of left renal lesion -Can consider nonemergent MRI with renal protocol for further outpatient work-up   Right adrenal adenoma -Unchanged from prior imaging   Hyperlipidemia -Continue statin   Hypothyroidism -Continue on Synthroid   BPH -Continue Proscar and Flomax             Status is: Inpatient   Remains inpatient appropriate because: severity of illness requiring IV heparin               Family Communication:   no Family at bedside   Consultants:  cardiology, general surgery   Code Status:  FULL   DVT Prophylaxis:  IV Heparin --Start: 04/05/21 2325     Procedures: As Listed in Progress Note Above   Antibiotics: None   Subjective: Patient denies cp, sob, dizziness, f/c.  Abd pain is improved.  Tolerating diet.  Denies n/v/d  Objective: Vitals:   04/07/21 2029 04/07/21 2227 04/08/21 0408 04/08/21 1247  BP: (!) 101/50  133/77 (!) 110/48  Pulse: (!) 104  97 88  Resp: 20  20 18   Temp: 99.6 F (37.6 C)  98 F (36.7 C) 98.7 F (37.1 C)  TempSrc:    Oral  SpO2: 94% 93% 94% 96%  Weight:      Height:        Intake/Output Summary (Last 24 hours) at 04/08/2021 1359 Last data filed at 04/08/2021 0917 Gross per 24 hour  Intake 836 ml  Output 1700 ml  Net -  864 ml   Weight change:  Exam:  General:  Pt is alert, follows commands appropriately, not in acute distress HEENT: No icterus, No thrush, No neck mass, [place]/AT Cardiovascular: RRR, S1/S2, no rubs, no gallops Respiratory: CTA bilaterally, no wheezing, no crackles, no rhonchi Abdomen: Soft/+BS, non tender, non distended, no guarding Extremities: No edema, No lymphangitis, No petechiae, No rashes,  no synovitis   Data Reviewed: I have personally reviewed following labs and imaging studies Basic Metabolic Panel: Recent Labs  Lab 04/05/21 1446 04/06/21 0023 04/07/21 0623 04/08/21 0611  NA 137 134* 132* 131*  K 3.8 3.8 4.8 4.2  CL 92* 92* 92* 93*  CO2 30 26 23 25   GLUCOSE 95 126* 91 89  BUN 29* 37* 59* 42*  CREATININE 4.63* 5.18* 7.77* 6.59*  CALCIUM 8.8* 9.1 9.1 8.8*  MG  --  1.7  --   --   PHOS  --  6.4*  --  5.2*   Liver Function Tests: Recent Labs  Lab 04/05/21 1446 04/06/21 0023 04/07/21 0623 04/08/21 0611  AST 20 24 28   --   ALT 12 13 13   --   ALKPHOS 81 95 80  --   BILITOT 0.6 0.8 1.0  --   PROT 7.8 8.7* 7.4  --   ALBUMIN 4.0 4.4 3.6 3.3*   Recent Labs  Lab 04/05/21 1446  LIPASE 56*   No results for input(s): AMMONIA in the last 168 hours. Coagulation Profile: No results for input(s): INR, PROTIME in the last 168 hours. CBC: Recent Labs  Lab 04/05/21 1446 04/06/21 0023 04/07/21 0623 04/08/21 0611  WBC 4.1 6.5 14.8* 11.2*  HGB 12.1* 13.7 12.3* 11.9*  HCT 37.4* 42.6 38.0* 36.0*  MCV 99.2 98.2 95.5 98.4  PLT 179 162 164 177   Cardiac Enzymes: No results for input(s): CKTOTAL, CKMB, CKMBINDEX, TROPONINI in the last 168 hours. BNP: Invalid input(s): POCBNP CBG: No results for input(s): GLUCAP in the last 168 hours. HbA1C: No results for input(s): HGBA1C in the last 72 hours. Urine analysis:    Component Value Date/Time   COLORURINE YELLOW 02/09/2020 [place] 02/09/2020 0958   LABSPEC 1.012 02/09/2020 0958   PHURINE 5.0 02/09/2020 0958   GLUCOSEU NEGATIVE 02/09/2020 0958   HGBUR NEGATIVE 02/09/2020 0958   BILIRUBINUR NEGATIVE 02/09/2020 0958   KETONESUR NEGATIVE 02/09/2020 0958   PROTEINUR 100 (A) 02/09/2020 0958   NITRITE NEGATIVE 02/09/2020 0958   LEUKOCYTESUR NEGATIVE 02/09/2020 0958   Sepsis Labs: @LABRCNTIP (procalcitonin:4,lacticidven:4) ) Recent Results (from the past 240 hour(s))  MRSA Next Gen by PCR,  Nasal     Status: None   Collection Time: 04/07/21 12:23 AM   Specimen: Nasal Mucosa; Nasal Swab  Result Value Ref Range Status   MRSA by PCR Next Gen NOT DETECTED NOT DETECTED Final    Comment: (NOTE) The GeneXpert MRSA Assay (FDA approved for NASAL specimens only), is one component of a comprehensive MRSA colonization surveillance program. It is not intended to diagnose MRSA infection nor to guide or monitor treatment for MRSA infections. Test performance is not FDA approved in patients less than 13 years old. Performed at [facility], [address], [place],  [address]   SARS CORONAVIRUS 2 (Sherrell Farish 6-24 HRS) Nasopharyngeal Nasopharyngeal Swab     Status: None   Collection Time: 04/07/21 12:56 AM   Specimen: Nasopharyngeal Swab  Result Value Ref Range Status   SARS Coronavirus 2 NEGATIVE NEGATIVE Final    Comment: (NOTE) SARS-CoV-2 target nucleic  acids are NOT DETECTED.  The SARS-CoV-2 RNA is generally detectable in upper and lower respiratory specimens during the acute phase of infection. Negative results do not preclude SARS-CoV-2 infection, do not rule out co-infections with other pathogens, and should not be used as the sole basis for treatment or other patient management decisions. Negative results must be combined with clinical observations, patient history, and epidemiological information. The expected result is Negative.  Fact Sheet for Patients: SugarRoll.be  Fact Sheet for Healthcare Providers: https://www.woods-mathews.com/  This test is not yet approved or cleared by the [place] FDA and  has been authorized for detection and/or diagnosis of SARS-CoV-2 by FDA under an Emergency Use Authorization (EUA). This EUA will remain  in effect (meaning this test can be used) for the duration of the COVID-19 declaration under Se ction 564(b)(1) of the Act, 21 U.S.C. section 360bbb-3(b)(1), unless the authorization is  terminated or revoked sooner.  Performed at [facility], [place] [address], [place], [place] [address]      Scheduled Meds:  alum & mag hydroxide-simeth  30 mL Oral Once   And   lidocaine  15 mL Oral Once   atorvastatin  80 mg Oral q1800   [START ON 04/09/2021] calcitRIOL  0.5 mcg Oral Q M,W,F-HD   carvedilol  3.125 mg Oral BID   Chlorhexidine Gluconate Cloth  6 each Topical Q0600   [START ON 04/09/2021] Chlorhexidine Gluconate Cloth  6 each Topical Q0600   cholecalciferol  1,000 Units Oral Daily   clopidogrel  75 mg Oral Daily   finasteride  5 mg Oral Daily   furosemide  80 mg Oral Q T,Th,S,Su   isosorbide mononitrate  120 mg Oral Daily   levothyroxine  50 mcg Oral QAC breakfast   omega-3 acid ethyl esters  1 g Oral BID   pantoprazole (PROTONIX) IV  40 mg Intravenous Q12H   tamsulosin  0.4 mg Oral Daily   Continuous Infusions:  sodium chloride     sodium chloride     heparin 1,850 Units/hr (04/08/21 0845)    Procedures/Studies: CT ABDOMEN PELVIS WO CONTRAST  Result Date: 04/05/2021 CLINICAL DATA:  Possible abscess/ infection Nausea/ vomitting EXAM: CT ABDOMEN AND PELVIS WITHOUT CONTRAST TECHNIQUE: Multidetector CT imaging of the abdomen and pelvis was performed following the standard protocol without IV contrast. COMPARISON:  None. FINDINGS: Lower chest: No acute abnormality. Aortic valve leaflet calcifications. Coronary artery calcifications. Sternotomy wires noted. Hepatobiliary: No focal liver abnormality. Calcified gallstone noted within the gallbladder lumen. Question pericholecystic fluid. Slight haziness of the gallbladder wall. No definite gallbladder wall thickening no biliary dilatation. Pancreas: No focal lesion. Normal pancreatic contour. No surrounding inflammatory changes. No main pancreatic ductal dilatation. Spleen: Normal in size without focal abnormality. Adrenals/Urinary Tract: There is a 2.4 cm right adrenal gland nodule with a density of -10 Hounsfield  units consistent with nodule adenoma. Bilateral renal atrophy. Multiple fluid density lesions likely represent simple renal cysts with the largest on the right measuring up to 8.3 cm. Subcentimeter densities are too small to characterize. There is a 1.3 cm lesion with a density of 26 Hounsfield units that is indeterminate (2:39) in the left kidney. Calcifications associated with kidneys appear to be vascular. No nephrolithiasis and no hydronephrosis. No definite contour-deforming renal mass. No ureterolithiasis or hydroureter. The urinary bladder is unremarkable. Stomach/Bowel: Stomach is within normal limits. No evidence of bowel wall thickening or dilatation. Scattered colonic diverticulosis. The appendix not definitely identified. Vascular/Lymphatic: No abdominal aorta or iliac aneurysm.  Severe atherosclerotic plaque of the aorta and its branches. No abdominal, pelvic, or inguinal lymphadenopathy. Reproductive: Prostate is unremarkable. Other: No intraperitoneal free fluid. No intraperitoneal free gas. No organized fluid collection. Musculoskeletal: Status post right repair with. Likely left repair. No definite inguinal hernia repair. No suspicious lytic or blastic osseous lesions. No acute displaced fracture. Multilevel degenerative changes of the spine. IMPRESSION: 1. Cholelithiasis with question pericholecystic fluid and slight haziness of the gallbladder wall. Recommend right upper quadrant ultrasound for a more sensitive evaluation of the gallbladder. 2. Indeterminate 1.3 cm left renal lesion. Recommend nonemergent MRI renal protocol for further evaluation. When the patient is clinically stable and able to follow directions and hold their breath (preferably as an outpatient) further evaluation with dedicated abdominal MRI should be considered. 3. Colonic diverticulosis with no acute diverticulitis. 4. 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 5.  Aortic Atherosclerosis (ICD10-I70.0)-severe. Electronically Signed    By: Iven Finn M.D.   On: 04/05/2021 19:18   DG Chest 2 View  Result Date: 04/05/2021 CLINICAL DATA:  Chest pain for 45 minutes, central chest pain and epigastric pain EXAM: CHEST - 2 VIEW COMPARISON:  08/30/2020 FINDINGS: Frontal and lateral views of the chest demonstrate postsurgical changes from prior median sternotomy. The cardiac silhouette is unremarkable. No acute airspace disease, effusion, or pneumothorax. No acute bony abnormalities. IMPRESSION: 1. No acute intrathoracic process. Electronically Signed   By: Randa Ngo M.D.   On: 04/05/2021 15:06   ECHOCARDIOGRAM COMPLETE  Result Date: 04/06/2021    ECHOCARDIOGRAM REPORT   Patient Name:   [name] Date of Exam: 04/06/2021 Medical Rec #:  [id number]      Height:       69.0 in Accession #:    [id number]     Weight:       181.7 lb Date of Birth:  11-06-1945      BSA:          1.983 m Patient Age:    37 years       BP:           185/61 mmHg Patient Gender: M              HR:           91 bpm. Exam Location:  [facility] Procedure: 2D Echo, Cardiac Doppler and Color Doppler Indications:    Chest Pain  History:        Patient has prior history of Echocardiogram examinations, most                 recent 05/09/2020. CHF, Previous Myocardial Infarction and CAD,                 Prior Cardiac Surgery and Prior CABG, COPD, Signs/Symptoms:Chest                 Pain; Risk Factors:Hypertension, Former Smoker and Dyslipidemia.  Sonographer:    Wenda Low Referring Phys: [id number] [place]  1. Left ventricular ejection fraction, by estimation, is 55 to 60%. The left ventricle has normal function. The left ventricle has no regional wall motion abnormalities. There is severe left ventricular hypertrophy. Left ventricular diastolic parameters  are indeterminate.  2. Right ventricular systolic function is normal. The right ventricular size is normal. There is normal pulmonary artery systolic pressure.  3. The mitral valve is  normal in structure. No evidence of mitral valve regurgitation. No evidence of mitral stenosis.  4. The aortic valve  has an indeterminant number of cusps. There is severe calcifcation of the aortic valve. There is severe thickening of the aortic valve. Aortic valve regurgitation is mild. Mild to moderate aortic valve stenosis.  5. The inferior vena cava is normal in size with greater than 50% respiratory variability, suggesting right atrial pressure of 3 mmHg. FINDINGS  Left Ventricle: Left ventricular ejection fraction, by estimation, is 55 to 60%. The left ventricle has normal function. The left ventricle has no regional wall motion abnormalities. The left ventricular internal cavity size was normal in size. There is  severe left ventricular hypertrophy. Left ventricular diastolic parameters are indeterminate. Right Ventricle: The right ventricular size is normal. No increase in right ventricular wall thickness. Right ventricular systolic function is normal. There is normal pulmonary artery systolic pressure. The tricuspid regurgitant velocity is 1.70 m/s, and  with an assumed right atrial pressure of 3 mmHg, the estimated right ventricular systolic pressure is 02.4 mmHg. Left Atrium: Left atrial size was normal in size. Right Atrium: Right atrial size was normal in size. Pericardium: There is no evidence of pericardial effusion. Mitral Valve: The mitral valve is normal in structure. No evidence of mitral valve regurgitation. No evidence of mitral valve stenosis. MV peak gradient, 5.0 mmHg. The mean mitral valve gradient is 2.0 mmHg. Tricuspid Valve: The tricuspid valve is normal in structure. Tricuspid valve regurgitation is not demonstrated. No evidence of tricuspid stenosis. Aortic Valve: The aortic valve has an indeterminant number of cusps. There is severe calcifcation of the aortic valve. There is severe thickening of the aortic valve. There is severe aortic valve annular calcification. Aortic valve  regurgitation is mild.  Aortic regurgitation PHT measures 376 msec. Mild to moderate aortic stenosis is present. Aortic valve mean gradient measures 17.2 mmHg. Aortic valve peak gradient measures 30.7 mmHg. Aortic valve area, by VTI measures 1.20 cm. Pulmonic Valve: The pulmonic valve was not well visualized. Pulmonic valve regurgitation is not visualized. No evidence of pulmonic stenosis. Aorta: The aortic root is normal in size and structure. Venous: The inferior vena cava is normal in size with greater than 50% respiratory variability, suggesting right atrial pressure of 3 mmHg. IAS/Shunts: No atrial level shunt detected by color flow Doppler.  LEFT VENTRICLE PLAX 2D LVIDd:         3.70 cm     Diastology LVIDs:         2.75 cm     LV e' medial:    5.77 cm/s LV PW:         1.50 cm     LV E/e' medial:  14.9 LV IVS:        1.50 cm     LV e' lateral:   8.38 cm/s LVOT diam:     2.00 cm     LV E/e' lateral: 10.3 LV SV:         61 LV SV Index:   31 LVOT Area:     3.14 cm  LV Volumes (MOD) LV vol d, MOD A2C: 66.1 ml LV vol d, MOD A4C: 54.4 ml LV vol s, MOD A2C: 34.6 ml LV vol s, MOD A4C: 26.9 ml LV SV MOD A2C:     31.5 ml LV SV MOD A4C:     54.4 ml LV SV MOD BP:      28.7 ml RIGHT VENTRICLE RV Basal diam:  3.30 cm RV Mid diam:    2.30 cm LEFT ATRIUM  Index        RIGHT ATRIUM           Index LA diam:        3.50 cm 1.76 cm/m   RA Area:     18.30 cm LA Vol (A2C):   76.8 ml 38.72 ml/m  RA Volume:   45.90 ml  23.14 ml/m LA Vol (A4C):   66.0 ml 33.28 ml/m LA Biplane Vol: 72.1 ml 36.35 ml/m  AORTIC VALVE                     PULMONIC VALVE AV Area (Vmax):    1.04 cm      PV Vmax:       1.08 m/s AV Area (Vmean):   1.12 cm      PV Peak grad:  4.7 mmHg AV Area (VTI):     1.20 cm AV Vmax:           277.00 cm/s AV Vmean:          193.000 cm/s AV VTI:            0.507 m AV Peak Grad:      30.7 mmHg AV Mean Grad:      17.2 mmHg LVOT Vmax:         92.00 cm/s LVOT Vmean:        69.000 cm/s LVOT VTI:          0.193  m LVOT/AV VTI ratio: 0.38 AI PHT:            376 msec  AORTA Ao Root diam: 3.30 cm Ao Asc diam:  3.10 cm MITRAL VALVE                TRICUSPID VALVE MV Area (PHT): 4.06 cm     TR Peak grad:   11.6 mmHg MV Area VTI:   2.77 cm     TR Vmax:        170.00 cm/s MV Peak grad:  5.0 mmHg MV Mean grad:  2.0 mmHg     SHUNTS MV Vmax:       1.12 m/s     Systemic VTI:  0.19 m MV Vmean:      59.3 cm/s    Systemic Diam: 2.00 cm MV Decel Time: 187 msec MV E velocity: 86.10 cm/s MV A velocity: 104.00 cm/s MV E/A ratio:  0.83 Carlyle Dolly MD Electronically signed by Carlyle Dolly MD Signature Date/Time: 04/06/2021/4:28:18 PM    Final    US Abdomen Limited RUQ (LIVER/GB)  Result Date: 04/06/2021 CLINICAL DATA:  75 year old male with a history of right upper quadrant pain EXAM: ULTRASOUND ABDOMEN LIMITED RIGHT UPPER QUADRANT COMPARISON:  CT 04/05/2021, ultrasound 02/07/2020 FINDINGS: Gallbladder: Gallbladder distended. The thickness of the near field wall measures 3 mm. Echogenic debris/stones at the gallbladder fundus, compatible with prior CT. Sonographic Murphy sign labeled as negative. Common bile duct: Diameter: Common bile duct measures 5 mm. Liver: No focal lesion identified. Within normal limits in parenchymal echogenicity. Portal vein is patent on color Doppler imaging with normal direction of blood flow towards the liver. Other: Incidental imaging of renal cysts which were present on the comparison CT. IMPRESSION: Ultrasound is equivocal for acute cholecystitis, as there is significant gallbladder distension, evidence of cholelithiasis, however, sonographic Murphy's sign is negative. Electronically Signed   By: Corrie Mckusick D.O.   On: 04/06/2021 10:51    Orson Eva, DO  [facility]  If 7PM-7AM, please contact  night-coverage [email or web address] Password [email or web address] 04/08/2021, 1:59 PM   LOS: 3 days

## 2021-04-08 NOTE — Progress Notes (Signed)
CRITICAL VALUE STICKER  CRITICAL VALUE: Troponin 3548 up from 2885 yesterday morning  RECEIVER (on-site recipient of call):Kimbely Whiteaker RN  DATE & TIME NOTIFIED: 04/08/21 0725  MESSENGER (representative from lab):  MD NOTIFIED: Dr.Tat  TIME OF NOTIFICATION:0735  RESPONSE:  still awaiting

## 2021-04-08 NOTE — Progress Notes (Signed)
ANTICOAGULATION CONSULT NOTE   Pharmacy Consult for Heparin Indication: chest pain/ACS  Allergies  Allergen Reactions   Cardizem Cd [Diltiazem Hcl Er Beads] Palpitations    "Makes heart skip"   Cardizem [Diltiazem]     Patient Measurements: Height: 5\' 9"  (175.3 cm) Weight: 84.5 kg (186 lb 4.6 oz) IBW/kg (Calculated) : 70.7 HEPARIN DW (KG): 83   Vital Signs: Temp: 99.6 F (37.6 C) (11/24 2020) Temp Source: Oral (11/24 2020) BP: 96/43 (11/24 2020) Pulse Rate: 91 (11/24 2020)  Labs: Recent Labs    04/06/21 0023 04/06/21 0237 04/07/21 0623 04/07/21 1540 04/07/21 2310 04/08/21 0611 04/08/21 0751 04/08/21 0837 04/08/21 1014 04/08/21 1719  HGB 13.7  --  12.3*  --   --  11.9*  --   --   --   --   HCT 42.6  --  38.0*  --   --  36.0*  --   --   --   --   PLT 162  --  164  --   --  177  --   --   --   --   APTT 34  --   --   --   --   --   --   --   --   --   HEPARINUNFRC  --    < > 0.26*   < > 0.26*  --  0.29*  --   --  0.31  CREATININE 5.18*  --  7.77*  --   --  6.59*  --   --   --   --   TROPONINIHS 89*   < > 2,885*  --   --  3,548*  --  3,396* 3,684*  --    < > = values in this interval not displayed.     Estimated Creatinine Clearance: 9.8 mL/min (A) (by C-G formula based on SCr of 6.59 mg/dL (H)).    Assessment: 75 y.o. male on heparin for ACS. Cards wants to monitor for 24 hours on heparin with no cath planned at this time.  Heparin level at lower end of therapeutic (0.31) on 1850 units/hr. Given patient's history of GI bleed, will not increase rate at this time.   Goal of Therapy:  Heparin level 0.3-0.7 units/ml Monitor platelets by anticoagulation protocol: Yes   Plan:  Continue heparin infusion at 1850 units/hr Confirmatory heparin level with AM labs Monitor CBC, heparin level, and s/sx of bleeding daily.   Joseph Art, Pharm.D. PGY-1 Pharmacy Resident [phone] 04/08/2021 8:35 PM

## 2021-04-08 NOTE — Progress Notes (Signed)
ANTICOAGULATION CONSULT NOTE   Pharmacy Consult for Heparin Indication: chest pain/ACS  Allergies  Allergen Reactions   Cardizem Cd [Diltiazem Hcl Er Beads] Palpitations    "Makes heart skip"   Cardizem [Diltiazem]     Patient Measurements: Height: 5\' 9"  (175.3 cm) Weight: 84.5 kg (186 lb 4.6 oz) IBW/kg (Calculated) : 70.7 HEPARIN DW (KG): 83   Vital Signs: Temp: 98 F (36.7 C) (11/24 0408) BP: 133/77 (11/24 0408) Pulse Rate: 97 (11/24 0408)  Labs: Recent Labs    04/06/21 0023 04/06/21 0237 04/06/21 0809 04/06/21 1138 04/07/21 0623 04/07/21 1540 04/07/21 2310 04/08/21 0611 04/08/21 0751  HGB 13.7  --   --   --  12.3*  --   --  11.9*  --   HCT 42.6  --   --   --  38.0*  --   --  36.0*  --   PLT 162  --   --   --  164  --   --  177  --   APTT 34  --   --   --   --   --   --   --   --   HEPARINUNFRC  --   --   --    < > 0.26* 0.30 0.26*  --  0.29*  CREATININE 5.18*  --   --   --  7.77*  --   --  6.59*  --   TROPONINIHS 89*   < > 653*  --  2,885*  --   --  3,548*  --    < > = values in this interval not displayed.     Estimated Creatinine Clearance: 9.8 mL/min (A) (by C-G formula based on SCr of 6.59 mg/dL (H)).    Assessment: 75 y.o. male on heparin for ACS. Cards wants to monitor for 24 hours on heparin with no cath planned at this time.  Heparin level slightly subtherapeutic (0.29)   Goal of Therapy:  Heparin level 0.3-0.7 units/ml Monitor platelets by anticoagulation protocol: Yes   Plan:  Increase heparin infusion to 1850 units/hr Check heparin level in 8 hours    Margot Ables, PharmD Clinical Pharmacist 04/08/2021 8:31 AM

## 2021-04-08 NOTE — Progress Notes (Signed)
Lab called with troponin of 3684. Dr. Carles Collet aware. No new orders at this time.

## 2021-04-08 NOTE — Progress Notes (Signed)
Patient ID: [name], male   DOB: 10-05-1945, 75 y.o.   MRN: [id number]  Subjective: Last HD on 11/23 with 1.5 kg UF.  He states HD went ok.  Review of systems:  Denies chest pain or shortness of breath (today or yesterday) Denies n/v but has emesis bag "just in case"  O:BP 133/77 (BP Location: Left Arm)   Pulse 97   Temp 98 F (36.7 C)   Resp 20   Ht 5\' 9"  (1.753 m)   Wt 84.5 kg   SpO2 94%   BMI 27.51 kg/m   Intake/Output Summary (Last 24 hours) at 04/08/2021 1213 Last data filed at 04/08/2021 0917 Gross per 24 hour  Intake 836 ml  Output 1700 ml  Net -864 ml   Intake/Output: I/O last 3 completed shifts: In: 720 [P.O.:720] Out: 1700 [Urine:200; Other:1500]  Intake/Output this shift:  Total I/O In: 596 [P.O.:596] Out: -  Weight change:   General adult male in bed in no acute distress HEENT normocephalic atraumatic extraocular movements intact sclera anicteric Neck supple trachea midline Lungs clear to auscultation bilaterally normal work of breathing at rest on room air Heart S1S2 no rub Abdomen soft mild ttp nondistended Extremities no edema  Psych normal mood and affect Access: RUE AVF +T/B  Recent Labs  Lab 04/05/21 1446 04/06/21 0023 04/07/21 0623 04/08/21 0611  NA 137 134* 132* 131*  K 3.8 3.8 4.8 4.2  CL 92* 92* 92* 93*  CO2 30 26 23 25   GLUCOSE 95 126* 91 89  BUN 29* 37* 59* 42*  CREATININE 4.63* 5.18* 7.77* 6.59*  ALBUMIN 4.0 4.4 3.6 3.3*  CALCIUM 8.8* 9.1 9.1 8.8*  PHOS  --  6.4*  --  5.2*  AST 20 24 28   --   ALT 12 13 13   --    Liver Function Tests: Recent Labs  Lab 04/05/21 1446 04/06/21 0023 04/07/21 0623 04/08/21 0611  AST 20 24 28   --   ALT 12 13 13   --   ALKPHOS 81 95 80  --   BILITOT 0.6 0.8 1.0  --   PROT 7.8 8.7* 7.4  --   ALBUMIN 4.0 4.4 3.6 3.3*   Recent Labs  Lab 04/05/21 1446  LIPASE 56*   No results for input(s): AMMONIA in the last 168 hours. CBC: Recent Labs  Lab 04/05/21 1446 04/06/21 0023  04/07/21 0623 04/08/21 0611  WBC 4.1 6.5 14.8* 11.2*  HGB 12.1* 13.7 12.3* 11.9*  HCT 37.4* 42.6 38.0* 36.0*  MCV 99.2 98.2 95.5 98.4  PLT 179 162 164 177    Studies/Results: ECHOCARDIOGRAM COMPLETE  Result Date: 04/06/2021    ECHOCARDIOGRAM REPORT   Patient Name:   [name] Date of Exam: 04/06/2021 Medical Rec #:  [id number]      Height:       69.0 in Accession #:    [id number]     Weight:       181.7 lb Date of Birth:  08-23-1945      BSA:          1.983 m Patient Age:    20 years       BP:           185/61 mmHg Patient Gender: M              HR:           91 bpm. Exam Location:  [facility] Procedure: 2D Echo, Cardiac Doppler and Color Doppler Indications:  Chest Pain  History:        Patient has prior history of Echocardiogram examinations, most                 recent 05/09/2020. CHF, Previous Myocardial Infarction and CAD,                 Prior Cardiac Surgery and Prior CABG, COPD, Signs/Symptoms:Chest                 Pain; Risk Factors:Hypertension, Former Smoker and Dyslipidemia.  Sonographer:    Wenda Low Referring Phys: [id number] [place]  1. Left ventricular ejection fraction, by estimation, is 55 to 60%. The left ventricle has normal function. The left ventricle has no regional wall motion abnormalities. There is severe left ventricular hypertrophy. Left ventricular diastolic parameters  are indeterminate.  2. Right ventricular systolic function is normal. The right ventricular size is normal. There is normal pulmonary artery systolic pressure.  3. The mitral valve is normal in structure. No evidence of mitral valve regurgitation. No evidence of mitral stenosis.  4. The aortic valve has an indeterminant number of cusps. There is severe calcifcation of the aortic valve. There is severe thickening of the aortic valve. Aortic valve regurgitation is mild. Mild to moderate aortic valve stenosis.  5. The inferior vena cava is normal in size with greater than 50%  respiratory variability, suggesting right atrial pressure of 3 mmHg. FINDINGS  Left Ventricle: Left ventricular ejection fraction, by estimation, is 55 to 60%. The left ventricle has normal function. The left ventricle has no regional wall motion abnormalities. The left ventricular internal cavity size was normal in size. There is  severe left ventricular hypertrophy. Left ventricular diastolic parameters are indeterminate. Right Ventricle: The right ventricular size is normal. No increase in right ventricular wall thickness. Right ventricular systolic function is normal. There is normal pulmonary artery systolic pressure. The tricuspid regurgitant velocity is 1.70 m/s, and  with an assumed right atrial pressure of 3 mmHg, the estimated right ventricular systolic pressure is 33.5 mmHg. Left Atrium: Left atrial size was normal in size. Right Atrium: Right atrial size was normal in size. Pericardium: There is no evidence of pericardial effusion. Mitral Valve: The mitral valve is normal in structure. No evidence of mitral valve regurgitation. No evidence of mitral valve stenosis. MV peak gradient, 5.0 mmHg. The mean mitral valve gradient is 2.0 mmHg. Tricuspid Valve: The tricuspid valve is normal in structure. Tricuspid valve regurgitation is not demonstrated. No evidence of tricuspid stenosis. Aortic Valve: The aortic valve has an indeterminant number of cusps. There is severe calcifcation of the aortic valve. There is severe thickening of the aortic valve. There is severe aortic valve annular calcification. Aortic valve regurgitation is mild.  Aortic regurgitation PHT measures 376 msec. Mild to moderate aortic stenosis is present. Aortic valve mean gradient measures 17.2 mmHg. Aortic valve peak gradient measures 30.7 mmHg. Aortic valve area, by VTI measures 1.20 cm. Pulmonic Valve: The pulmonic valve was not well visualized. Pulmonic valve regurgitation is not visualized. No evidence of pulmonic stenosis. Aorta: The  aortic root is normal in size and structure. Venous: The inferior vena cava is normal in size with greater than 50% respiratory variability, suggesting right atrial pressure of 3 mmHg. IAS/Shunts: No atrial level shunt detected by color flow Doppler.  LEFT VENTRICLE PLAX 2D LVIDd:         3.70 cm     Diastology LVIDs:  2.75 cm     LV e' medial:    5.77 cm/s LV PW:         1.50 cm     LV E/e' medial:  14.9 LV IVS:        1.50 cm     LV e' lateral:   8.38 cm/s LVOT diam:     2.00 cm     LV E/e' lateral: 10.3 LV SV:         61 LV SV Index:   31 LVOT Area:     3.14 cm  LV Volumes (MOD) LV vol d, MOD A2C: 66.1 ml LV vol d, MOD A4C: 54.4 ml LV vol s, MOD A2C: 34.6 ml LV vol s, MOD A4C: 26.9 ml LV SV MOD A2C:     31.5 ml LV SV MOD A4C:     54.4 ml LV SV MOD BP:      28.7 ml RIGHT VENTRICLE RV Basal diam:  3.30 cm RV Mid diam:    2.30 cm LEFT ATRIUM             Index        RIGHT ATRIUM           Index LA diam:        3.50 cm 1.76 cm/m   RA Area:     18.30 cm LA Vol (A2C):   76.8 ml 38.72 ml/m  RA Volume:   45.90 ml  23.14 ml/m LA Vol (A4C):   66.0 ml 33.28 ml/m LA Biplane Vol: 72.1 ml 36.35 ml/m  AORTIC VALVE                     PULMONIC VALVE AV Area (Vmax):    1.04 cm      PV Vmax:       1.08 m/s AV Area (Vmean):   1.12 cm      PV Peak grad:  4.7 mmHg AV Area (VTI):     1.20 cm AV Vmax:           277.00 cm/s AV Vmean:          193.000 cm/s AV VTI:            0.507 m AV Peak Grad:      30.7 mmHg AV Mean Grad:      17.2 mmHg LVOT Vmax:         92.00 cm/s LVOT Vmean:        69.000 cm/s LVOT VTI:          0.193 m LVOT/AV VTI ratio: 0.38 AI PHT:            376 msec  AORTA Ao Root diam: 3.30 cm Ao Asc diam:  3.10 cm MITRAL VALVE                TRICUSPID VALVE MV Area (PHT): 4.06 cm     TR Peak grad:   11.6 mmHg MV Area VTI:   2.77 cm     TR Vmax:        170.00 cm/s MV Peak grad:  5.0 mmHg MV Mean grad:  2.0 mmHg     SHUNTS MV Vmax:       1.12 m/s     Systemic VTI:  0.19 m MV Vmean:      59.3 cm/s    Systemic  Diam: 2.00 cm MV Decel Time: 187 msec MV E velocity: 86.10 cm/s MV A velocity: 104.00 cm/s  MV E/A ratio:  0.83 Carlyle Dolly MD Electronically signed by Carlyle Dolly MD Signature Date/Time: 04/06/2021/4:28:18 PM    Final     alum & mag hydroxide-simeth  30 mL Oral Once   And   lidocaine  15 mL Oral Once   atorvastatin  80 mg Oral q1800   carvedilol  3.125 mg Oral BID   Chlorhexidine Gluconate Cloth  6 each Topical Q0600   cholecalciferol  1,000 Units Oral Daily   clopidogrel  75 mg Oral Daily   finasteride  5 mg Oral Daily   furosemide  80 mg Oral Q T,Th,S,Su   isosorbide mononitrate  120 mg Oral Daily   levothyroxine  50 mcg Oral QAC breakfast   omega-3 acid ethyl esters  1 g Oral BID   pantoprazole (PROTONIX) IV  40 mg Intravenous Q12H   tamsulosin  0.4 mg Oral Daily    BMET    Component Value Date/Time   NA 131 (L) 04/08/2021 0611   K 4.2 04/08/2021 0611   CL 93 (L) 04/08/2021 0611   CO2 25 04/08/2021 0611   GLUCOSE 89 04/08/2021 0611   BUN 42 (H) 04/08/2021 0611   CREATININE 6.59 (H) 04/08/2021 0611   CALCIUM 8.8 (L) 04/08/2021 0611   CALCIUM 8.5 (L) 04/24/2020 1112   GFRNONAA 8 (L) 04/08/2021 0611   GFRAA 19 (L) 02/11/2020 0849   CBC    Component Value Date/Time   WBC 11.2 (H) 04/08/2021 0611   RBC 3.66 (L) 04/08/2021 0611   HGB 11.9 (L) 04/08/2021 0611   HCT 36.0 (L) 04/08/2021 0611   PLT 177 04/08/2021 0611   MCV 98.4 04/08/2021 0611   MCH 32.5 04/08/2021 0611   MCHC 33.1 04/08/2021 0611   RDW 15.9 (H) 04/08/2021 0611   LYMPHSABS 0.8 08/30/2020 0449   MONOABS 0.5 08/30/2020 0449   EOSABS 0.1 08/30/2020 0449   BASOSABS 0.0 08/30/2020 0449   Dialysis Orders: Center: [facility]  on MWF . EDW 83 kg HD [place] 2K/2.5Ca  Time 4 hours Heparin none. Access  RUE AVF BFR 400 DFR 500    Calcitriol 0.5 mcg po/HD Epogen 5000   Units IV/HD  Venofer  100 mg IV/HD last dose for 04/07/21    Assessment/Plan:  Abdominal pain associated with N/V - CT with  choledocholithiasis and + Murphy's sign.  Surgery has been consulted.  RUQ [place] equivocal for acute cholecystitis with significant gallbladder distension, cholelithiasis, negative Murphy's sign.  Per Surgery, they are holding off on surgery given rising troponins and cardiac issues. Rising troponins - Cardiology following. Complex cardiac anatomy and holding off on cath for now.    ESRD -  HD per MWF schedule - next HD tomorrow unless urgent cardiac issue precludes  Hypertension/volume  - controlled  Anemia CKD - stable, hold ESA given rising troponin and Hb 58.5  Metabolic bone disease -  no binders on home med list. Improved with HD. On calcitriol 0.5 mcg three times a week - resume here CAD s/p CABG - Cardiology consulted due to ST and T wave abnormalities.  Claudia Desanctis, MD 12:26 PM 04/08/2021

## 2021-04-09 DIAGNOSIS — K8070 Calculus of gallbladder and bile duct without cholecystitis without obstruction: Secondary | ICD-10-CM

## 2021-04-09 DIAGNOSIS — R778 Other specified abnormalities of plasma proteins: Secondary | ICD-10-CM | POA: Diagnosis not present

## 2021-04-09 DIAGNOSIS — N186 End stage renal disease: Secondary | ICD-10-CM | POA: Diagnosis not present

## 2021-04-09 DIAGNOSIS — E782 Mixed hyperlipidemia: Secondary | ICD-10-CM | POA: Diagnosis not present

## 2021-04-09 LAB — RENAL FUNCTION PANEL
Albumin: 2.8 g/dL — ABNORMAL LOW (ref 3.5–5.0)
Anion gap: 17 — ABNORMAL HIGH (ref 5–15)
BUN: 61 mg/dL — ABNORMAL HIGH (ref 8–23)
CO2: 22 mmol/L (ref 22–32)
Calcium: 8.6 mg/dL — ABNORMAL LOW (ref 8.9–10.3)
Chloride: 93 mmol/L — ABNORMAL LOW (ref 98–111)
Creatinine, Ser: 8.31 mg/dL — ABNORMAL HIGH (ref 0.61–1.24)
GFR, Estimated: 6 mL/min — ABNORMAL LOW (ref >60)
Glucose, Bld: 93 mg/dL (ref 70–99)
Phosphorus: 5.5 mg/dL — ABNORMAL HIGH (ref 2.5–4.6)
Potassium: 4.3 mmol/L (ref 3.5–5.1)
Sodium: 132 mmol/L — ABNORMAL LOW (ref 135–145)

## 2021-04-09 LAB — CBC
HCT: 30.6 % — ABNORMAL LOW (ref 39.0–52.0)
Hemoglobin: 9.8 g/dL — ABNORMAL LOW (ref 13.0–17.0)
MCH: 31 pg (ref 26.0–34.0)
MCHC: 32 g/dL (ref 30.0–36.0)
MCV: 96.8 fL (ref 80.0–100.0)
Platelets: 151 10*3/uL (ref 150–400)
RBC: 3.16 MIL/uL — ABNORMAL LOW (ref 4.22–5.81)
RDW: 15.8 % — ABNORMAL HIGH (ref 11.5–15.5)
WBC: 9.4 10*3/uL (ref 4.0–10.5)
nRBC: 0 % (ref 0.0–0.2)

## 2021-04-09 LAB — TROPONIN I (HIGH SENSITIVITY): Troponin I (High Sensitivity): 2861 ng/L (ref <18)

## 2021-04-09 LAB — HEPARIN LEVEL (UNFRACTIONATED): Heparin Unfractionated: 0.32 IU/mL (ref 0.30–0.70)

## 2021-04-09 MED ORDER — TRAMADOL HCL 50 MG PO TABS
50.0000 mg | ORAL_TABLET | Freq: Once | ORAL | Status: AC
  Administered 2021-04-09: 50 mg via ORAL
  Filled 2021-04-09: qty 1

## 2021-04-09 MED ORDER — ACETAMINOPHEN 325 MG PO TABS
650.0000 mg | ORAL_TABLET | Freq: Once | ORAL | Status: AC
  Administered 2021-04-09: 650 mg via ORAL
  Filled 2021-04-09: qty 2

## 2021-04-09 MED ORDER — ISOSORBIDE MONONITRATE ER 60 MG PO TB24: 60.0000 mg | ORAL_TABLET | Freq: Every day | ORAL | Status: DC

## 2021-04-09 NOTE — Procedures (Signed)
   HEMODIALYSIS TREATMENT NOTE:  Uneventful 3.5 hour dialysis treatment completed via right upper arm AVF (15g/antegrade). Goal met: 1.5 liters removed without interruption in UF.  All blood was returned and hemostasis was achieved in 15 minutes.  No changes from pre-HD assessment.  Rockwell Alexandria, RN

## 2021-04-09 NOTE — TOC Transition Note (Signed)
Transition of Care [facility]) - CM/SW Discharge Note   Patient Details  Name: [name] MRN: [id number] Date of Birth: [birth year]  [facility] CM/SW Contact:  Boneta Lucks, RN Phone Number: 04/09/2021, 12:27 PM   Clinical Narrative: Patient discharging home after dialysis. Agreeable to Home health, [facility] placed for [facility] PT/RN and added to AVS.     Final next level of care: [place] Barriers to Discharge: Continued Medical Work up   Patient Goals and CMS Choice Patient states their goals for this hospitalization and ongoing recovery are:: Agreeable to [facility] CMS Medicare.gov Compare Post Acute Care list provided to:: Patient Choice offered to / list presented to : Patient  Discharge Placement               Patient to be transferred to facility by: Family   Patient and family notified of of transfer: 04/09/21  Discharge Plan and [place]: [place] ([place]) Date [place]: 04/07/21 Time [place]: [id number] Representative spoke with at [place]: Romualdo Bolk  Readmission Risk Interventions Readmission Risk Prevention Plan 04/07/2021 08/09/2020  Transportation Screening Complete Complete  Medication Review Press photographer) Complete Complete  HRI or [place] Complete Complete  SW Recovery Care/Counseling Consult Complete Complete  Palliative Care Screening Not Applicable Not [place] Not Applicable Not Applicable  Some recent data might be hidden

## 2021-04-09 NOTE — Discharge Summary (Signed)
Physician Discharge Summary  [name] WFU:[id number] DOB: 1945-12-30 DOA: 04/05/2021  PCP: [facility]  Admit date: 04/05/2021 Discharge date: 04/09/2021  Admitted From: Home Disposition:  Home   Recommendations for Outpatient Follow-up:  Follow up with PCP in 1-2 weeks Please obtain BMP/CBC in one week    Discharge Condition: Stable CODE STATUS:FULL Diet recommendation: renal   Brief/Interim Summary: 75 year old male with a history of end-stage renal disease on hemodialysis, hypertension, coronary artery disease, CHF, BPH, presents to the hospital with abdominal pain.  Reports epigastric pain with slight radiation to the right and was associated with nausea and vomiting.  Initial concern was for biliary source, but imaging was not convincing for cholecystitis.  Clinically, overall he has improved and is now tolerating a diet without pain or vomiting.  Suspect that he may have had biliary colic which is passed.  Was also noted to have elevated troponins.  Seen by cardiology with decision to treat conservatively.  He is on medical management.  Nephrology following for dialysis needs. Cardiology was consulted for his elevated troponin.  His repeat echo was reassuring.  He was started in IV heparin which he received about 4 days due to continued elevated troponin.  He was not felt to be a good heart cath candidate due to his complex cardiac anatomy and hx of GI bleed with commitment to [facility].  Fortunately, his troponin ultimately began to trend down.  He remained CP free without any sob.  Discharge Diagnoses:     Discharge Instructions Abdominal pain/Biliary Colic -Possibly secondary to biliary colic -Clinically, appears to have resolved -11/21 CT abd--Cholelithiasis with question pericholecystic fluid and slight haziness of the gallbladder wall -Ultrasound right upper quadrant was equivocal -Patient is tolerating diet at this time without worsening pain or  vomiting -LFTs unremarkable -Seen by general surgery, no indication for surgery at this time--case discussed with Dr. Arnoldo Morale -Can be followed as needed as an outpatient -continue IV protonix -diet advanced which patient tolerated   Elevated troponin  -Troponin up to 328>>2885>>3548>>3396>>3684>>2861 -Continue on IV heparin infusion -Cardiology following -Patient with difficult coronary anatomy status post CABG.  Most recent cardiac cath in 2019 did not show any amenable targets for intervention -Patient is also had difficulty coming to [facility] due to issues with GI bleeding -11/22 Echo EF 55-60,  no WMA -11/24--discussed with Dr. Harl Bowie due to uptrend troponin>>continue IV heparin and repeat troponin in am -Continue statin, Coreg, Plavix -patient remains cp free -11/25--discussed with cardiology, Dr. Shary Key for d/c as patient was asymptomatic and troponins trending down   End-stage renal disease on hemodialysis -Nephrology following -Last Dialysis 11/23 -plan for HD 11/25   Hypertension -Currently on Coreg, Imdur, as needed hydralazine -Blood pressure currently stable -Continue to use IV hydralazine as needed   Incidental finding of left renal lesion -Can consider nonemergent MRI with renal protocol for further outpatient work-up   Right adrenal adenoma -Unchanged from prior imaging   Hyperlipidemia -Continue statin   Hypothyroidism -Continue on Synthroid   BPH -Continue Proscar and Flomax      Allergies as of 04/09/2021       Reactions   Cardizem Cd [diltiazem Hcl Er Beads] Palpitations   "Makes heart skip"   Cardizem [diltiazem]         Medication List     STOP taking these medications    epoetin alfa 20000 UNIT/ML injection Commonly known as: EPOGEN   hydrALAZINE 100 MG tablet Commonly known as: APRESOLINE   magnesium oxide  400 MG tablet Commonly known as: MAG-OX       TAKE these medications    acetaminophen 325 MG tablet Commonly  known as: TYLENOL Take 2 tablets (650 mg total) by mouth every 6 (six) hours as needed for mild pain.   allopurinol 100 MG tablet Commonly known as: ZYLOPRIM Take 100 mg by mouth daily.   atorvastatin 80 MG tablet Commonly known as: LIPITOR Take 80 mg by mouth daily.   carvedilol 3.125 MG tablet Commonly known as: Coreg Take 1 tablet (3.125 mg total) by mouth 2 (two) times daily.   clopidogrel 75 MG tablet Commonly known as: PLAVIX Take 75 mg by mouth daily.   finasteride 5 MG tablet Commonly known as: PROSCAR Take 5 mg by mouth daily.   folic acid 1 MG tablet Commonly known as: FOLVITE Take 1 mg by mouth daily.   furosemide 80 MG tablet Commonly known as: LASIX Take 1 tablet (80 mg total) by mouth every Tuesday, Thursday, Saturday, and Sunday.   isosorbide mononitrate 120 MG 24 hr tablet Commonly known as: IMDUR Take 1 tablet (120 mg total) by mouth daily. What changed: how much to take   levothyroxine 50 MCG tablet Commonly known as: SYNTHROID Take 50 mcg by mouth daily before breakfast.   nitroGLYCERIN 0.4 MG SL tablet Commonly known as: NITROSTAT Place 0.4 mg under the tongue every 5 (five) minutes as needed for chest pain (max 3 doses).   omeprazole 20 MG capsule Commonly known as: PRILOSEC Take 20 mg by mouth 2 (two) times daily.   RA Fish Oil 1000 MG Caps Take 2,000 mg by mouth 2 (two) times daily.   tamsulosin 0.4 MG Caps capsule Commonly known as: FLOMAX Take 0.4 mg by mouth daily.   Vitamin D (Cholecalciferol) 25 MCG (1000 UT) Caps Take 1,000 Units by mouth daily.        Follow-up Information     [facility] Follow [facility].   Specialty: [place] Why: PT/RN will call to schedule your first home visit.               Allergies  Allergen Reactions   Cardizem Cd [Diltiazem Hcl Er Beads] Palpitations    "Makes heart skip"   Cardizem [Diltiazem]      Consultations: Renal cardiology   Procedures/Studies: CT ABDOMEN PELVIS WO CONTRAST  Result Date: 04/05/2021 CLINICAL DATA:  Possible abscess/ infection Nausea/ vomitting EXAM: CT ABDOMEN AND PELVIS WITHOUT CONTRAST TECHNIQUE: Multidetector CT imaging of the abdomen and pelvis was performed following the standard protocol without IV contrast. COMPARISON:  None. FINDINGS: Lower chest: No acute abnormality. Aortic valve leaflet calcifications. Coronary artery calcifications. Sternotomy wires noted. Hepatobiliary: No focal liver abnormality. Calcified gallstone noted within the gallbladder lumen. Question pericholecystic fluid. Slight haziness of the gallbladder wall. No definite gallbladder wall thickening no biliary dilatation. Pancreas: No focal lesion. Normal pancreatic contour. No surrounding inflammatory changes. No main pancreatic ductal dilatation. Spleen: Normal in size without focal abnormality. Adrenals/Urinary Tract: There is a 2.4 cm right adrenal gland nodule with a density of -10 Hounsfield units consistent with nodule adenoma. Bilateral renal atrophy. Multiple fluid density lesions likely represent simple renal cysts with the largest on the right measuring up to 8.3 cm. Subcentimeter densities are too small to characterize. There is a 1.3 cm lesion with a density of 26 Hounsfield units that is indeterminate (2:39) in the left kidney. Calcifications associated with kidneys appear to be vascular. No nephrolithiasis and no hydronephrosis. No definite  contour-deforming renal mass. No ureterolithiasis or hydroureter. The urinary bladder is unremarkable. Stomach/Bowel: Stomach is within normal limits. No evidence of bowel wall thickening or dilatation. Scattered colonic diverticulosis. The appendix not definitely identified. Vascular/Lymphatic: No abdominal aorta or iliac aneurysm. Severe atherosclerotic plaque of the aorta and its branches. No abdominal, pelvic, or inguinal lymphadenopathy.  Reproductive: Prostate is unremarkable. Other: No intraperitoneal free fluid. No intraperitoneal free gas. No organized fluid collection. Musculoskeletal: Status post right repair with. Likely left repair. No definite inguinal hernia repair. No suspicious lytic or blastic osseous lesions. No acute displaced fracture. Multilevel degenerative changes of the spine. IMPRESSION: 1. Cholelithiasis with question pericholecystic fluid and slight haziness of the gallbladder wall. Recommend right upper quadrant ultrasound for a more sensitive evaluation of the gallbladder. 2. Indeterminate 1.3 cm left renal lesion. Recommend nonemergent MRI renal protocol for further evaluation. When the patient is clinically stable and able to follow directions and hold their breath (preferably as an outpatient) further evaluation with dedicated abdominal MRI should be considered. 3. Colonic diverticulosis with no acute diverticulitis. 4. 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 5.  Aortic Atherosclerosis (ICD10-I70.0)-severe. Electronically Signed   By: Iven Finn M.D.   On: 04/05/2021 19:18   DG Chest 2 View  Result Date: 04/05/2021 CLINICAL DATA:  Chest pain for 45 minutes, central chest pain and epigastric pain EXAM: CHEST - 2 VIEW COMPARISON:  08/30/2020 FINDINGS: Frontal and lateral views of the chest demonstrate postsurgical changes from prior median sternotomy. The cardiac silhouette is unremarkable. No acute airspace disease, effusion, or pneumothorax. No acute bony abnormalities. IMPRESSION: 1. No acute intrathoracic process. Electronically Signed   By: Randa Ngo M.D.   On: 04/05/2021 15:06   ECHOCARDIOGRAM COMPLETE  Result Date: 04/06/2021    ECHOCARDIOGRAM REPORT   Patient Name:   [name] Date of Exam: 04/06/2021 Medical Rec #:  [id number]      Height:       69.0 in Accession #:    [id number]     Weight:       181.7 lb Date of Birth:  1946/03/01      BSA:          1.983 m Patient Age:    50 years       BP:            185/61 mmHg Patient Gender: M              HR:           91 bpm. Exam Location:  [facility] Procedure: 2D Echo, Cardiac Doppler and Color Doppler Indications:    Chest Pain  History:        Patient has prior history of Echocardiogram examinations, most                 recent 05/09/2020. CHF, Previous Myocardial Infarction and CAD,                 Prior Cardiac Surgery and Prior CABG, COPD, Signs/Symptoms:Chest                 Pain; Risk Factors:Hypertension, Former Smoker and Dyslipidemia.  Sonographer:    Wenda Low Referring Phys: [id number] [place]  1. Left ventricular ejection fraction, by estimation, is 55 to 60%. The left ventricle has normal function. The left ventricle has no regional wall motion abnormalities. There is severe left ventricular hypertrophy. Left ventricular diastolic parameters  are indeterminate.  2. Right ventricular systolic  function is normal. The right ventricular size is normal. There is normal pulmonary artery systolic pressure.  3. The mitral valve is normal in structure. No evidence of mitral valve regurgitation. No evidence of mitral stenosis.  4. The aortic valve has an indeterminant number of cusps. There is severe calcifcation of the aortic valve. There is severe thickening of the aortic valve. Aortic valve regurgitation is mild. Mild to moderate aortic valve stenosis.  5. The inferior vena cava is normal in size with greater than 50% respiratory variability, suggesting right atrial pressure of 3 mmHg. FINDINGS  Left Ventricle: Left ventricular ejection fraction, by estimation, is 55 to 60%. The left ventricle has normal function. The left ventricle has no regional wall motion abnormalities. The left ventricular internal cavity size was normal in size. There is  severe left ventricular hypertrophy. Left ventricular diastolic parameters are indeterminate. Right Ventricle: The right ventricular size is normal. No increase in right ventricular wall  thickness. Right ventricular systolic function is normal. There is normal pulmonary artery systolic pressure. The tricuspid regurgitant velocity is 1.70 m/s, and  with an assumed right atrial pressure of 3 mmHg, the estimated right ventricular systolic pressure is 69.4 mmHg. Left Atrium: Left atrial size was normal in size. Right Atrium: Right atrial size was normal in size. Pericardium: There is no evidence of pericardial effusion. Mitral Valve: The mitral valve is normal in structure. No evidence of mitral valve regurgitation. No evidence of mitral valve stenosis. MV peak gradient, 5.0 mmHg. The mean mitral valve gradient is 2.0 mmHg. Tricuspid Valve: The tricuspid valve is normal in structure. Tricuspid valve regurgitation is not demonstrated. No evidence of tricuspid stenosis. Aortic Valve: The aortic valve has an indeterminant number of cusps. There is severe calcifcation of the aortic valve. There is severe thickening of the aortic valve. There is severe aortic valve annular calcification. Aortic valve regurgitation is mild.  Aortic regurgitation PHT measures 376 msec. Mild to moderate aortic stenosis is present. Aortic valve mean gradient measures 17.2 mmHg. Aortic valve peak gradient measures 30.7 mmHg. Aortic valve area, by VTI measures 1.20 cm. Pulmonic Valve: The pulmonic valve was not well visualized. Pulmonic valve regurgitation is not visualized. No evidence of pulmonic stenosis. Aorta: The aortic root is normal in size and structure. Venous: The inferior vena cava is normal in size with greater than 50% respiratory variability, suggesting right atrial pressure of 3 mmHg. IAS/Shunts: No atrial level shunt detected by color flow Doppler.  LEFT VENTRICLE PLAX 2D LVIDd:         3.70 cm     Diastology LVIDs:         2.75 cm     LV e' medial:    5.77 cm/s LV PW:         1.50 cm     LV E/e' medial:  14.9 LV IVS:        1.50 cm     LV e' lateral:   8.38 cm/s LVOT diam:     2.00 cm     LV E/e' lateral: 10.3  LV SV:         61 LV SV Index:   31 LVOT Area:     3.14 cm  LV Volumes (MOD) LV vol d, MOD A2C: 66.1 ml LV vol d, MOD A4C: 54.4 ml LV vol s, MOD A2C: 34.6 ml LV vol s, MOD A4C: 26.9 ml LV SV MOD A2C:     31.5 ml LV SV MOD A4C:  54.4 ml LV SV MOD BP:      28.7 ml RIGHT VENTRICLE RV Basal diam:  3.30 cm RV Mid diam:    2.30 cm LEFT ATRIUM             Index        RIGHT ATRIUM           Index LA diam:        3.50 cm 1.76 cm/m   RA Area:     18.30 cm LA Vol (A2C):   76.8 ml 38.72 ml/m  RA Volume:   45.90 ml  23.14 ml/m LA Vol (A4C):   66.0 ml 33.28 ml/m LA Biplane Vol: 72.1 ml 36.35 ml/m  AORTIC VALVE                     PULMONIC VALVE AV Area (Vmax):    1.04 cm      PV Vmax:       1.08 m/s AV Area (Vmean):   1.12 cm      PV Peak grad:  4.7 mmHg AV Area (VTI):     1.20 cm AV Vmax:           277.00 cm/s AV Vmean:          193.000 cm/s AV VTI:            0.507 m AV Peak Grad:      30.7 mmHg AV Mean Grad:      17.2 mmHg LVOT Vmax:         92.00 cm/s LVOT Vmean:        69.000 cm/s LVOT VTI:          0.193 m LVOT/AV VTI ratio: 0.38 AI PHT:            376 msec  AORTA Ao Root diam: 3.30 cm Ao Asc diam:  3.10 cm MITRAL VALVE                TRICUSPID VALVE MV Area (PHT): 4.06 cm     TR Peak grad:   11.6 mmHg MV Area VTI:   2.77 cm     TR Vmax:        170.00 cm/s MV Peak grad:  5.0 mmHg MV Mean grad:  2.0 mmHg     SHUNTS MV Vmax:       1.12 m/s     Systemic VTI:  0.19 m MV Vmean:      59.3 cm/s    Systemic Diam: 2.00 cm MV Decel Time: 187 msec MV E velocity: 86.10 cm/s MV A velocity: 104.00 cm/s MV E/A ratio:  0.83 Carlyle Dolly MD Electronically signed by Carlyle Dolly MD Signature Date/Time: 04/06/2021/4:28:18 PM    Final    US Abdomen Limited RUQ (LIVER/GB)  Result Date: 04/06/2021 CLINICAL DATA:  75 year old male with a history of right upper quadrant pain EXAM: ULTRASOUND ABDOMEN LIMITED RIGHT UPPER QUADRANT COMPARISON:  CT 04/05/2021, ultrasound 02/07/2020 FINDINGS: Gallbladder: Gallbladder  distended. The thickness of the near field wall measures 3 mm. Echogenic debris/stones at the gallbladder fundus, compatible with prior CT. Sonographic Murphy sign labeled as negative. Common bile duct: Diameter: Common bile duct measures 5 mm. Liver: No focal lesion identified. Within normal limits in parenchymal echogenicity. Portal vein is patent on color Doppler imaging with normal direction of blood flow towards the liver. Other: Incidental imaging of renal cysts which were present on the comparison CT. IMPRESSION: Ultrasound is equivocal for acute cholecystitis,  as there is significant gallbladder distension, evidence of cholelithiasis, however, sonographic Murphy's sign is negative. Electronically Signed   By: Corrie Mckusick D.O.   On: 04/06/2021 10:51        Discharge Exam: Vitals:   04/09/21 1130 04/09/21 1200  BP: (!) 109/47 (!) 102/53  Pulse: 73 73  Resp: 19 (!) 22  Temp:    SpO2:     Vitals:   04/09/21 1030 04/09/21 1100 04/09/21 1130 04/09/21 1200  BP: (!) 110/50 (!) 107/45 (!) 109/47 (!) 102/53  Pulse: 75 72 73 73  Resp: 20 20 19  (!) 22  Temp:      TempSrc:      SpO2:      Weight:      Height:        General: Pt is alert, awake, not in acute distress Cardiovascular: RRR, S1/S2 +, no rubs, no gallops Respiratory: CTA bilaterally, no wheezing, no rhonchi Abdominal: Soft, NT, ND, bowel sounds + Extremities: no edema, no cyanosis   The results of significant diagnostics from this hospitalization (including imaging, microbiology, ancillary and laboratory) are listed below for reference.    Significant Diagnostic Studies: CT ABDOMEN PELVIS WO CONTRAST  Result Date: 04/05/2021 CLINICAL DATA:  Possible abscess/ infection Nausea/ vomitting EXAM: CT ABDOMEN AND PELVIS WITHOUT CONTRAST TECHNIQUE: Multidetector CT imaging of the abdomen and pelvis was performed following the standard protocol without IV contrast. COMPARISON:  None. FINDINGS: Lower chest: No acute abnormality.  Aortic valve leaflet calcifications. Coronary artery calcifications. Sternotomy wires noted. Hepatobiliary: No focal liver abnormality. Calcified gallstone noted within the gallbladder lumen. Question pericholecystic fluid. Slight haziness of the gallbladder wall. No definite gallbladder wall thickening no biliary dilatation. Pancreas: No focal lesion. Normal pancreatic contour. No surrounding inflammatory changes. No main pancreatic ductal dilatation. Spleen: Normal in size without focal abnormality. Adrenals/Urinary Tract: There is a 2.4 cm right adrenal gland nodule with a density of -10 Hounsfield units consistent with nodule adenoma. Bilateral renal atrophy. Multiple fluid density lesions likely represent simple renal cysts with the largest on the right measuring up to 8.3 cm. Subcentimeter densities are too small to characterize. There is a 1.3 cm lesion with a density of 26 Hounsfield units that is indeterminate (2:39) in the left kidney. Calcifications associated with kidneys appear to be vascular. No nephrolithiasis and no hydronephrosis. No definite contour-deforming renal mass. No ureterolithiasis or hydroureter. The urinary bladder is unremarkable. Stomach/Bowel: Stomach is within normal limits. No evidence of bowel wall thickening or dilatation. Scattered colonic diverticulosis. The appendix not definitely identified. Vascular/Lymphatic: No abdominal aorta or iliac aneurysm. Severe atherosclerotic plaque of the aorta and its branches. No abdominal, pelvic, or inguinal lymphadenopathy. Reproductive: Prostate is unremarkable. Other: No intraperitoneal free fluid. No intraperitoneal free gas. No organized fluid collection. Musculoskeletal: Status post right repair with. Likely left repair. No definite inguinal hernia repair. No suspicious lytic or blastic osseous lesions. No acute displaced fracture. Multilevel degenerative changes of the spine. IMPRESSION: 1. Cholelithiasis with question pericholecystic  fluid and slight haziness of the gallbladder wall. Recommend right upper quadrant ultrasound for a more sensitive evaluation of the gallbladder. 2. Indeterminate 1.3 cm left renal lesion. Recommend nonemergent MRI renal protocol for further evaluation. When the patient is clinically stable and able to follow directions and hold their breath (preferably as an outpatient) further evaluation with dedicated abdominal MRI should be considered. 3. Colonic diverticulosis with no acute diverticulitis. 4. 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 5.  Aortic Atherosclerosis (ICD10-I70.0)-severe. Electronically Signed  By: Iven Finn M.D.   On: 04/05/2021 19:18   DG Chest 2 View  Result Date: 04/05/2021 CLINICAL DATA:  Chest pain for 45 minutes, central chest pain and epigastric pain EXAM: CHEST - 2 VIEW COMPARISON:  08/30/2020 FINDINGS: Frontal and lateral views of the chest demonstrate postsurgical changes from prior median sternotomy. The cardiac silhouette is unremarkable. No acute airspace disease, effusion, or pneumothorax. No acute bony abnormalities. IMPRESSION: 1. No acute intrathoracic process. Electronically Signed   By: Randa Ngo M.D.   On: 04/05/2021 15:06   ECHOCARDIOGRAM COMPLETE  Result Date: 04/06/2021    ECHOCARDIOGRAM REPORT   Patient Name:   [name] Date of Exam: 04/06/2021 Medical Rec #:  [id number]      Height:       69.0 in Accession #:    [id number]     Weight:       181.7 lb Date of Birth:  [birth year]      BSA:          1.983 m Patient Age:    73 years       BP:           185/61 mmHg Patient Gender: M              HR:           91 bpm. Exam Location:  [facility] Procedure: 2D Echo, Cardiac Doppler and Color Doppler Indications:    Chest Pain  History:        Patient has prior history of Echocardiogram examinations, most                 recent 05/09/2020. CHF, Previous Myocardial Infarction and CAD,                 Prior Cardiac Surgery and Prior CABG, COPD, Signs/Symptoms:Chest                  Pain; Risk Factors:Hypertension, Former Smoker and Dyslipidemia.  Sonographer:    Wenda Low Referring Phys: [id number] [place]  1. Left ventricular ejection fraction, by estimation, is 55 to 60%. The left ventricle has normal function. The left ventricle has no regional wall motion abnormalities. There is severe left ventricular hypertrophy. Left ventricular diastolic parameters  are indeterminate.  2. Right ventricular systolic function is normal. The right ventricular size is normal. There is normal pulmonary artery systolic pressure.  3. The mitral valve is normal in structure. No evidence of mitral valve regurgitation. No evidence of mitral stenosis.  4. The aortic valve has an indeterminant number of cusps. There is severe calcifcation of the aortic valve. There is severe thickening of the aortic valve. Aortic valve regurgitation is mild. Mild to moderate aortic valve stenosis.  5. The inferior vena cava is normal in size with greater than 50% respiratory variability, suggesting right atrial pressure of 3 mmHg. FINDINGS  Left Ventricle: Left ventricular ejection fraction, by estimation, is 55 to 60%. The left ventricle has normal function. The left ventricle has no regional wall motion abnormalities. The left ventricular internal cavity size was normal in size. There is  severe left ventricular hypertrophy. Left ventricular diastolic parameters are indeterminate. Right Ventricle: The right ventricular size is normal. No increase in right ventricular wall thickness. Right ventricular systolic function is normal. There is normal pulmonary artery systolic pressure. The tricuspid regurgitant velocity is 1.70 m/s, and  with an assumed right atrial pressure of 3 mmHg, the estimated right ventricular  systolic pressure is 96.2 mmHg. Left Atrium: Left atrial size was normal in size. Right Atrium: Right atrial size was normal in size. Pericardium: There is no evidence of  pericardial effusion. Mitral Valve: The mitral valve is normal in structure. No evidence of mitral valve regurgitation. No evidence of mitral valve stenosis. MV peak gradient, 5.0 mmHg. The mean mitral valve gradient is 2.0 mmHg. Tricuspid Valve: The tricuspid valve is normal in structure. Tricuspid valve regurgitation is not demonstrated. No evidence of tricuspid stenosis. Aortic Valve: The aortic valve has an indeterminant number of cusps. There is severe calcifcation of the aortic valve. There is severe thickening of the aortic valve. There is severe aortic valve annular calcification. Aortic valve regurgitation is mild.  Aortic regurgitation PHT measures 376 msec. Mild to moderate aortic stenosis is present. Aortic valve mean gradient measures 17.2 mmHg. Aortic valve peak gradient measures 30.7 mmHg. Aortic valve area, by VTI measures 1.20 cm. Pulmonic Valve: The pulmonic valve was not well visualized. Pulmonic valve regurgitation is not visualized. No evidence of pulmonic stenosis. Aorta: The aortic root is normal in size and structure. Venous: The inferior vena cava is normal in size with greater than 50% respiratory variability, suggesting right atrial pressure of 3 mmHg. IAS/Shunts: No atrial level shunt detected by color flow Doppler.  LEFT VENTRICLE PLAX 2D LVIDd:         3.70 cm     Diastology LVIDs:         2.75 cm     LV e' medial:    5.77 cm/s LV PW:         1.50 cm     LV E/e' medial:  14.9 LV IVS:        1.50 cm     LV e' lateral:   8.38 cm/s LVOT diam:     2.00 cm     LV E/e' lateral: 10.3 LV SV:         61 LV SV Index:   31 LVOT Area:     3.14 cm  LV Volumes (MOD) LV vol d, MOD A2C: 66.1 ml LV vol d, MOD A4C: 54.4 ml LV vol s, MOD A2C: 34.6 ml LV vol s, MOD A4C: 26.9 ml LV SV MOD A2C:     31.5 ml LV SV MOD A4C:     54.4 ml LV SV MOD BP:      28.7 ml RIGHT VENTRICLE RV Basal diam:  3.30 cm RV Mid diam:    2.30 cm LEFT ATRIUM             Index        RIGHT ATRIUM           Index LA diam:         3.50 cm 1.76 cm/m   RA Area:     18.30 cm LA Vol (A2C):   76.8 ml 38.72 ml/m  RA Volume:   45.90 ml  23.14 ml/m LA Vol (A4C):   66.0 ml 33.28 ml/m LA Biplane Vol: 72.1 ml 36.35 ml/m  AORTIC VALVE                     PULMONIC VALVE AV Area (Vmax):    1.04 cm      PV Vmax:       1.08 m/s AV Area (Vmean):   1.12 cm      PV Peak grad:  4.7 mmHg AV Area (VTI):     1.20 cm AV  Vmax:           277.00 cm/s AV Vmean:          193.000 cm/s AV VTI:            0.507 m AV Peak Grad:      30.7 mmHg AV Mean Grad:      17.2 mmHg LVOT Vmax:         92.00 cm/s LVOT Vmean:        69.000 cm/s LVOT VTI:          0.193 m LVOT/AV VTI ratio: 0.38 AI PHT:            376 msec  AORTA Ao Root diam: 3.30 cm Ao Asc diam:  3.10 cm MITRAL VALVE                TRICUSPID VALVE MV Area (PHT): 4.06 cm     TR Peak grad:   11.6 mmHg MV Area VTI:   2.77 cm     TR Vmax:        170.00 cm/s MV Peak grad:  5.0 mmHg MV Mean grad:  2.0 mmHg     SHUNTS MV Vmax:       1.12 m/s     Systemic VTI:  0.19 m MV Vmean:      59.3 cm/s    Systemic Diam: 2.00 cm MV Decel Time: 187 msec MV E velocity: 86.10 cm/s MV A velocity: 104.00 cm/s MV E/A ratio:  0.83 Carlyle Dolly MD Electronically signed by Carlyle Dolly MD Signature Date/Time: 04/06/2021/4:28:18 PM    Final    US Abdomen Limited RUQ (LIVER/GB)  Result Date: 04/06/2021 CLINICAL DATA:  75 year old male with a history of right upper quadrant pain EXAM: ULTRASOUND ABDOMEN LIMITED RIGHT UPPER QUADRANT COMPARISON:  CT 04/05/2021, ultrasound 02/07/2020 FINDINGS: Gallbladder: Gallbladder distended. The thickness of the near field wall measures 3 mm. Echogenic debris/stones at the gallbladder fundus, compatible with prior CT. Sonographic Murphy sign labeled as negative. Common bile duct: Diameter: Common bile duct measures 5 mm. Liver: No focal lesion identified. Within normal limits in parenchymal echogenicity. Portal vein is patent on color Doppler imaging with normal direction of blood flow towards  the liver. Other: Incidental imaging of renal cysts which were present on the comparison CT. IMPRESSION: Ultrasound is equivocal for acute cholecystitis, as there is significant gallbladder distension, evidence of cholelithiasis, however, sonographic Murphy's sign is negative. Electronically Signed   By: Corrie Mckusick D.O.   On: 04/06/2021 10:51    Microbiology: Recent Results (from the past 240 hour(s))  MRSA Next Gen by PCR, Nasal     Status: None   Collection Time: 04/07/21 12:23 AM   Specimen: Nasal Mucosa; Nasal Swab  Result Value Ref Range Status   MRSA by PCR Next Gen NOT DETECTED NOT DETECTED Final    Comment: (NOTE) The GeneXpert MRSA Assay (FDA approved for NASAL specimens only), is one component of a comprehensive MRSA colonization surveillance program. It is not intended to diagnose MRSA infection nor to guide or monitor treatment for MRSA infections. Test performance is not FDA approved in patients less than 39 years old. Performed at [facility], [address], [place], [place] [address]   SARS CORONAVIRUS 2 ([name] 6-24 HRS) Nasopharyngeal Nasopharyngeal Swab     Status: None   Collection Time: 04/07/21 12:56 AM   Specimen: Nasopharyngeal Swab  Result Value Ref Range Status   SARS Coronavirus 2 NEGATIVE NEGATIVE Final    Comment: (  NOTE) SARS-CoV-2 target nucleic acids are NOT DETECTED.  The SARS-CoV-2 RNA is generally detectable in upper and lower respiratory specimens during the acute phase of infection. Negative results do not preclude SARS-CoV-2 infection, do not rule out co-infections with other pathogens, and should not be used as the sole basis for treatment or other patient management decisions. Negative results must be combined with clinical observations, patient history, and epidemiological information. The expected result is Negative.  Fact Sheet for Patients: SugarRoll.be  Fact Sheet for Healthcare  Providers: https://www.woods-mathews.com/  This test is not yet approved or cleared by the [place] FDA and  has been authorized for detection and/or diagnosis of SARS-CoV-2 by FDA under an Emergency Use Authorization (EUA). This EUA will remain  in effect (meaning this test can be used) for the duration of the COVID-19 declaration under Se ction 564(b)(1) of the Act, 21 U.S.C. section 360bbb-3(b)(1), unless the authorization is terminated or revoked sooner.  Performed at [facility], [place] [address] [place], [place] [address]      Labs: Basic Metabolic Panel: Recent Labs  Lab 04/05/21 1446 04/06/21 0023 04/07/21 0623 04/08/21 0611 04/09/21 0604  NA 137 134* 132* 131* 132*  K 3.8 3.8 4.8 4.2 4.3  CL 92* 92* 92* 93* 93*  CO2 30 26 23 25 22   GLUCOSE 95 126* 91 89 93  BUN 29* 37* 59* 42* 61*  CREATININE 4.63* 5.18* 7.77* 6.59* 8.31*  CALCIUM 8.8* 9.1 9.1 8.8* 8.6*  MG  --  1.7  --   --   --   PHOS  --  6.4*  --  5.2* 5.5*   Liver Function Tests: Recent Labs  Lab 04/05/21 1446 04/06/21 0023 04/07/21 0623 04/08/21 0611 04/09/21 0604  AST 20 24 28   --   --   ALT 12 13 13   --   --   ALKPHOS 81 95 80  --   --   BILITOT 0.6 0.8 1.0  --   --   PROT 7.8 8.7* 7.4  --   --   ALBUMIN 4.0 4.4 3.6 3.3* 2.8*   Recent Labs  Lab 04/05/21 1446  LIPASE 56*   No results for input(s): AMMONIA in the last 168 hours. CBC: Recent Labs  Lab 04/05/21 1446 04/06/21 0023 04/07/21 0623 04/08/21 0611 04/09/21 0604  WBC 4.1 6.5 14.8* 11.2* 9.4  HGB 12.1* 13.7 12.3* 11.9* 9.8*  HCT 37.4* 42.6 38.0* 36.0* 30.6*  MCV 99.2 98.2 95.5 98.4 96.8  PLT 179 162 164 177 151   Cardiac Enzymes: No results for input(s): CKTOTAL, CKMB, CKMBINDEX, TROPONINI in the last 168 hours. BNP: Invalid input(s): POCBNP CBG: No results for input(s): GLUCAP in the last 168 hours.  Time coordinating discharge:  36 minutes  Signed:  Orson Eva, DO [facility] Pager:  [phone] 04/09/2021, 12:26 PM

## 2021-04-09 NOTE — Progress Notes (Addendum)
Patient ID: [name], male   DOB: [birth year], 75 y.o.   MRN: [id number]  Subjective: Last HD on 11/23 with 1.5 kg UF.  He feels ok this am other than a headache.  He has continued on heparin gtt.  He states normally on imdur 120 mg daily at home.  Review of systems:  Denies chest pain or shortness of breath Denies n/v but still has emesis bag "just in case"  O:BP (!) 96/35 (BP Location: Left Arm)   Pulse 88   Temp 99.4 F (37.4 C) (Oral)   Resp 20   Ht 5\' 9"  (1.753 m)   Wt 84.5 kg   SpO2 94%   BMI 27.51 kg/m   Intake/Output Summary (Last 24 hours) at 04/09/2021 0916 Last data filed at 04/09/2021 0819 Gross per 24 hour  Intake 2134.45 ml  Output --  Net 2134.45 ml   Intake/Output: I/O last 3 completed shifts: In: 1898.5 [P.O.:721; I.V.:1177.5] Out: -   Intake/Output this shift:  Total I/O In: 236 [P.O.:236] Out: -  Weight change:   General adult male in bed in no acute distress  HEENT normocephalic atraumatic extraocular movements intact sclera anicteric Neck supple trachea midline Lungs clear to auscultation bilaterally normal work of breathing at rest on room air Heart S1S2 no rub Abdomen soft mild ttp nondistended Extremities no edema  Psych normal mood and affect Access: RUE AVF +T/B  Recent Labs  Lab 04/05/21 1446 04/06/21 0023 04/07/21 0623 04/08/21 0611 04/09/21 0604  NA 137 134* 132* 131* 132*  K 3.8 3.8 4.8 4.2 4.3  CL 92* 92* 92* 93* 93*  CO2 30 26 23 25 22   GLUCOSE 95 126* 91 89 93  BUN 29* 37* 59* 42* 61*  CREATININE 4.63* 5.18* 7.77* 6.59* 8.31*  ALBUMIN 4.0 4.4 3.6 3.3* 2.8*  CALCIUM 8.8* 9.1 9.1 8.8* 8.6*  PHOS  --  6.4*  --  5.2* 5.5*  AST 20 24 28   --   --   ALT 12 13 13   --   --    Liver Function Tests: Recent Labs  Lab 04/05/21 1446 04/06/21 0023 04/07/21 0623 04/08/21 0611 04/09/21 0604  AST 20 24 28   --   --   ALT 12 13 13   --   --   ALKPHOS 81 95 80  --   --   BILITOT 0.6 0.8 1.0  --   --   PROT 7.8 8.7* 7.4  --    --   ALBUMIN 4.0 4.4 3.6 3.3* 2.8*   Recent Labs  Lab 04/05/21 1446  LIPASE 56*   No results for input(s): AMMONIA in the last 168 hours. CBC: Recent Labs  Lab 04/05/21 1446 04/06/21 0023 04/07/21 0623 04/08/21 0611 04/09/21 0604  WBC 4.1 6.5 14.8* 11.2* 9.4  HGB 12.1* 13.7 12.3* 11.9* 9.8*  HCT 37.4* 42.6 38.0* 36.0* 30.6*  MCV 99.2 98.2 95.5 98.4 96.8  PLT 179 162 164 177 151    Studies/Results: No results found.  alum & mag hydroxide-simeth  30 mL Oral Once   And   lidocaine  15 mL Oral Once   atorvastatin  80 mg Oral q1800   calcitRIOL  0.5 mcg Oral Q M,W,F-HD   carvedilol  3.125 mg Oral BID   Chlorhexidine Gluconate Cloth  6 each Topical Q0600   Chlorhexidine Gluconate Cloth  6 each Topical Q0600   cholecalciferol  1,000 Units Oral Daily   clopidogrel  75 mg Oral Daily   finasteride  5 mg Oral Daily   furosemide  80 mg Oral Q T,Th,S,Su   isosorbide mononitrate  120 mg Oral Daily   levothyroxine  50 mcg Oral QAC breakfast   omega-3 acid ethyl esters  1 g Oral BID   pantoprazole (PROTONIX) IV  40 mg Intravenous Q12H   tamsulosin  0.4 mg Oral Daily    BMET    Component Value Date/Time   NA 132 (L) 04/09/2021 0604   K 4.3 04/09/2021 0604   CL 93 (L) 04/09/2021 0604   CO2 22 04/09/2021 0604   GLUCOSE 93 04/09/2021 0604   BUN 61 (H) 04/09/2021 0604   CREATININE 8.31 (H) 04/09/2021 0604   CALCIUM 8.6 (L) 04/09/2021 0604   CALCIUM 8.5 (L) 04/24/2020 1112   GFRNONAA 6 (L) 04/09/2021 0604   GFRAA 19 (L) 02/11/2020 0849   CBC    Component Value Date/Time   WBC 9.4 04/09/2021 0604   RBC 3.16 (L) 04/09/2021 0604   HGB 9.8 (L) 04/09/2021 0604   HCT 30.6 (L) 04/09/2021 0604   PLT 151 04/09/2021 0604   MCV 96.8 04/09/2021 0604   MCH 31.0 04/09/2021 0604   MCHC 32.0 04/09/2021 0604   RDW 15.8 (H) 04/09/2021 0604   LYMPHSABS 0.8 08/30/2020 0449   MONOABS 0.5 08/30/2020 0449   EOSABS 0.1 08/30/2020 0449   BASOSABS 0.0 08/30/2020 0449   Dialysis Orders:  Center: [facility]  on MWF . EDW 83 kg HD Bath 2K/2.5Ca  Time 4 hours Heparin none. Access  RUE AVF BFR 400 DFR 500    Calcitriol 0.5 mcg po/HD Epogen 5000   Units IV/HD  Venofer  100 mg IV/HD last dose for 04/07/21    Assessment/Plan:  Abdominal pain associated with N/V - CT with choledocholithiasis and + Murphy's sign.  Surgery has been consulted.  RUQ [place] equivocal for acute cholecystitis with significant gallbladder distension, cholelithiasis, negative Murphy's sign.  Per Surgery, they are holding off on surgery given rising troponins and cardiac issues. Rising troponins - Cardiology following. Complex cardiac anatomy and holding off on cath for now.    ESRD -  HD per MWF schedule - next HD shortly  Hypertension/volume  - hypotension this AM. Reduce isosorbide to 60 mg daily -note normally on 120 mg daily at home and can assess titration as outpatient.   Anemia CKD - hold ESA given rising troponin   Metabolic bone disease -  no binders on home med list. Improved with HD. On calcitriol 0.5 mcg three times a week - resumed here CAD s/p CABG - Cardiology consulted due to ST and T wave abnormalities.  Disposition - states he thinks he is discharging home today and there is a d/c summary started.  If he does stay please contact nephrology over the weekend if needed  Claudia Desanctis, MD 04/09/2021 9:27 AM

## 2021-04-09 NOTE — Progress Notes (Signed)
ANTICOAGULATION CONSULT NOTE   Pharmacy Consult for Heparin Indication: chest pain/ACS  Allergies  Allergen Reactions   Cardizem Cd [Diltiazem Hcl Er Beads] Palpitations    "Makes heart skip"   Cardizem [Diltiazem]     Patient Measurements: Height: 5\' 9"  (175.3 cm) Weight: 84.5 kg (186 lb 4.6 oz) IBW/kg (Calculated) : 70.7 HEPARIN DW (KG): 83   Vital Signs: Temp: 99.4 F (37.4 C) (11/25 0422) Temp Source: Oral (11/25 0422) BP: 96/35 (11/25 0422) Pulse Rate: 88 (11/25 0422)  Labs: Recent Labs    04/07/21 0623 04/07/21 1540 04/08/21 0611 04/08/21 0751 04/08/21 0837 04/08/21 1014 04/08/21 1719 04/09/21 0604  HGB 12.3*  --  11.9*  --   --   --   --  9.8*  HCT 38.0*  --  36.0*  --   --   --   --  30.6*  PLT 164  --  177  --   --   --   --  151  HEPARINUNFRC 0.26*   < >  --  0.29*  --   --  0.31 0.32  CREATININE 7.77*  --  6.59*  --   --   --   --  8.31*  TROPONINIHS 2,885*  --  3,888*  --  3,396* 3,684*  --  2,861*   < > = values in this interval not displayed.     Estimated Creatinine Clearance: 7.8 mL/min (A) (by C-G formula based on SCr of 8.31 mg/dL (H)).    Assessment: 75 y.o. male on heparin for ACS. Cards wants to monitor for 24 hours on heparin with no cath planned at this time.  Heparin level at lower end of therapeutic (0.32) on 1850 units/hr. Given patient's history of GI bleed, will not increase rate at this time.   Goal of Therapy:  Heparin level 0.3-0.7 units/ml Monitor platelets by anticoagulation protocol: Yes   Plan:  Continue heparin infusion at 1850 units/hr heparin level with AM labs Likely discontinuing heparin infusion today Monitor CBC, heparin level, and s/sx of bleeding daily.   Margot Ables, PharmD Clinical Pharmacist 04/09/2021 7:50 AM

## 2021-04-09 NOTE — Care Management Important Message (Signed)
Important Message  Patient Details  Name: [name] MRN: [id number] Date of Birth: March 16, 1946   Medicare Important Message Given:        Boneta Lucks, RN 04/09/2021, 12:31 PM

## 2021-04-14 ENCOUNTER — Ambulatory Visit (INDEPENDENT_AMBULATORY_CARE_PROVIDER_SITE_OTHER): Payer: No Typology Code available for payment source

## 2021-04-14 ENCOUNTER — Other Ambulatory Visit: Payer: Self-pay

## 2021-04-14 ENCOUNTER — Ambulatory Visit (INDEPENDENT_AMBULATORY_CARE_PROVIDER_SITE_OTHER): Payer: No Typology Code available for payment source | Admitting: Vascular Surgery

## 2021-04-14 ENCOUNTER — Encounter: Payer: Self-pay | Admitting: Vascular Surgery

## 2021-04-14 VITALS — BP 147/49 | HR 58 | Temp 98.4°F | Wt 183.6 lb

## 2021-04-14 DIAGNOSIS — I6529 Occlusion and stenosis of unspecified carotid artery: Secondary | ICD-10-CM | POA: Diagnosis not present

## 2021-04-14 NOTE — Progress Notes (Signed)
[facility] [place]  Patient name: [name] MRN: [id number] DOB: 02/10/1946 Sex: male  REASON FOR VISIT: Evaluation carotid stenosis  HPI: [name] is a 75 y.o. male here today for evaluation of carotid stenosis.  He is known to me from prior right upper arm AV fistula creation approximately 1 year ago.  He was found to have carotid bruit and underwent duplex and is here today for further discussion.  He has no prior history of focal neurologic deficit.  Specifically no amaurosis fugax, transient ischemic attack or stroke.  He has been on hemodialysis since April of this year.  Does have a remote history of coronary artery bypass grafting.  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  Past Medical History:  Diagnosis Date   Acute myocardial infarction of other inferior wall, initial episode of care    Anemia    Anginal pain ([place])    Anxiety    Arthritis    Cancer ([place])    bladder 2013 removed, has cystoscopy 10/2016, has returned x 2   COPD (chronic obstructive pulmonary disease) ([place])    Coronary artery disease    Artery bypass graft Jan 2008   Dialysis patient [facility])    Dyspnea    Dysrhythmia    GERD (gastroesophageal reflux disease)    Heart murmur    History of hiatal hernia    Hyperlipidemia    Hypertension    Hypothyroidism    PONV (postoperative nausea and vomiting)    Renal disorder    Sleep apnea    Stroke ([place])    Tobacco user     Family History  Problem Relation Age of Onset   Heart attack Mother    Heart attack Brother    Colon cancer Neg Hx    Colon polyps Neg Hx     SOCIAL HISTORY: Social History   Tobacco Use   Smoking status: Former    Packs/day: 1.00    Years: 40.00    Pack years: 40.00    Types: Cigarettes    Start date: 02/20/1959    Quit date: 09/13/2016    Years  since quitting: 4.5   Smokeless tobacco: Never  Substance Use Topics   Alcohol use: No    Alcohol/week: 0.0 standard drinks    Comment: "No, not really"    Allergies  Allergen Reactions   Cardizem Cd [Diltiazem Hcl Er Beads] Palpitations    "Makes heart skip"   Cardizem [Diltiazem]     Current Outpatient Medications  Medication Sig Dispense Refill   acetaminophen (TYLENOL) 325 MG tablet Take 2 tablets (650 mg total) by mouth every 6 (six) hours as needed for mild pain.     allopurinol (ZYLOPRIM) 100 MG tablet Take 100 mg by mouth daily.      atorvastatin (LIPITOR) 80 MG tablet Take 80 mg by mouth daily.     carvedilol (COREG) 3.125 MG tablet Take 1 tablet (3.125 mg total) by mouth 2 (two) times daily. 60 tablet 3   clopidogrel (PLAVIX) 75 MG tablet Take 75 mg by mouth daily.     finasteride (PROSCAR) 5 MG tablet Take 5 mg by mouth daily.     folic acid (FOLVITE) 1 MG tablet Take 1 mg by mouth daily.     furosemide (LASIX) 80 MG tablet Take 1 tablet (80 mg  total) by mouth every Tuesday, Thursday, Saturday, and Sunday.     isosorbide mononitrate (IMDUR) 120 MG 24 hr tablet Take 1 tablet (120 mg total) by mouth daily. (Patient taking differently: Take 240 mg by mouth daily.) 30 tablet 5   levothyroxine (SYNTHROID, LEVOTHROID) 50 MCG tablet Take 50 mcg by mouth daily before breakfast.     nitroGLYCERIN (NITROSTAT) 0.4 MG SL tablet Place 0.4 mg under the tongue every 5 (five) minutes as needed for chest pain (max 3 doses).     Omega-3 Fatty Acids (RA FISH OIL) 1000 MG CAPS Take 2,000 mg by mouth 2 (two) times daily.     omeprazole (PRILOSEC) 20 MG capsule Take 20 mg by mouth 2 (two) times daily.     tamsulosin (FLOMAX) 0.4 MG CAPS capsule Take 0.4 mg by mouth daily.     Vitamin D, Cholecalciferol, 25 MCG (1000 UT) CAPS Take 1,000 Units by mouth daily.     No current facility-administered medications for this visit.    REVIEW OF SYSTEMS:  [X]  denotes positive finding, [ ]  denotes  negative finding Cardiac  Comments:  Chest pain or chest pressure:    Shortness of breath upon exertion:    Short of breath when lying flat:    Irregular heart rhythm:        Vascular    Pain in calf, thigh, or hip brought on by ambulation:    Pain in feet at night that wakes you up from your sleep:     Blood clot in your veins:    Leg swelling:           PHYSICAL EXAM: Vitals:   04/14/21 1423  BP: (!) 147/49  Pulse: (!) 58  Temp: 98.4 F (36.9 C)  TempSrc: Skin  SpO2: 97%  Weight: 183 lb 9.6 oz (83.3 kg)    GENERAL: The patient is a well-nourished male, in no acute distress. The vital signs are documented above. CARDIOVASCULAR: 2+ radial pulses bilaterally.  Harsh right and moderate left carotid bruit.  Excellent thrill in his right upper arm AV fistula PULMONARY: There is good air exchange  MUSCULOSKELETAL: There are no major deformities or cyanosis. NEUROLOGIC: No focal weakness or paresthesias are detected. SKIN: There are no ulcers or rashes noted. PSYCHIATRIC: The patient has a normal affect.  DATA:  Duplex today in our office of the carotid arteries reveal no significant stenosis on the right in the 1 to 39% range.  On the left he is predicted to be in the 60 to 79% stenosis range.  MEDICAL ISSUES: Had long discussion with the patient.  Explained that he is below the threshold of where we would recommend endarterectomy based on asymptomatic disease.  I did discuss signs and symptoms of carotid disease with him and he knows to report immediately to the emergency room should this occur.  He is on Plavix and will continue this.  We will see him again in 6 months with repeat carotid duplex    Rosetta Posner, MD FACS [facility] [phone]  Note: Portions of this report may have been transcribed using voice recognition software.  Every effort has been made to ensure accuracy; however, inadvertent computerized transcription  errors may still be present.

## 2021-04-16 ENCOUNTER — Other Ambulatory Visit: Payer: Self-pay

## 2021-04-16 DIAGNOSIS — I6529 Occlusion and stenosis of unspecified carotid artery: Secondary | ICD-10-CM

## 2021-04-19 ENCOUNTER — Other Ambulatory Visit: Payer: Self-pay | Admitting: *Deleted

## 2021-04-19 NOTE — Patient Outreach (Addendum)
[facility]) Care Management  04/19/2021  HAYLEN SHELNUTT 27-Jan-1946 [id number]  Initial outreach for care management servcies. Left message and requested a return call. Will call again on Friday.  [name]. [name], MSN, [facility] Gerontological Nurse Practitioner [facility] Care Management [phone]

## 2021-04-23 ENCOUNTER — Other Ambulatory Visit: Payer: Self-pay | Admitting: *Deleted

## 2021-04-23 NOTE — Patient Outreach (Signed)
[place] [facility]) Care Management  04/23/2021  ADONI GREENOUGH [birth year] [id number]  Second unsuccessful outreach for [place] Management services. Left a message, requested a return call. Advised will send our information and call back in 3 weeks.  [name]. [name], MSN, [facility] Gerontological Nurse Practitioner [facility] Care Management [phone]

## 2021-05-11 ENCOUNTER — Encounter (HOSPITAL_COMMUNITY): Payer: Self-pay

## 2021-05-11 ENCOUNTER — Inpatient Hospital Stay (HOSPITAL_COMMUNITY)
Admission: EM | Admit: 2021-05-11 | Discharge: 2021-05-12 | DRG: 444 | Disposition: A | Discharge status: Home / Self Care | Payer: No Typology Code available for payment source | Attending: Internal Medicine | Admitting: Internal Medicine

## 2021-05-11 ENCOUNTER — Inpatient Hospital Stay (HOSPITAL_COMMUNITY): Payer: No Typology Code available for payment source

## 2021-05-11 ENCOUNTER — Other Ambulatory Visit: Payer: Self-pay

## 2021-05-11 DIAGNOSIS — K219 Gastro-esophageal reflux disease without esophagitis: Secondary | ICD-10-CM

## 2021-05-11 DIAGNOSIS — E039 Hypothyroidism, unspecified: Secondary | ICD-10-CM | POA: Diagnosis present

## 2021-05-11 DIAGNOSIS — J449 Chronic obstructive pulmonary disease, unspecified: Secondary | ICD-10-CM | POA: Diagnosis present

## 2021-05-11 DIAGNOSIS — R1011 Right upper quadrant pain: Secondary | ICD-10-CM

## 2021-05-11 DIAGNOSIS — Z8249 Family history of ischemic heart disease and other diseases of the circulatory system: Secondary | ICD-10-CM | POA: Diagnosis not present

## 2021-05-11 DIAGNOSIS — E785 Hyperlipidemia, unspecified: Secondary | ICD-10-CM

## 2021-05-11 DIAGNOSIS — Z951 Presence of aortocoronary bypass graft: Secondary | ICD-10-CM | POA: Diagnosis not present

## 2021-05-11 DIAGNOSIS — Z79890 Hormone replacement therapy (postmenopausal): Secondary | ICD-10-CM

## 2021-05-11 DIAGNOSIS — Z8673 Personal history of transient ischemic attack (TIA), and cerebral infarction without residual deficits: Secondary | ICD-10-CM

## 2021-05-11 DIAGNOSIS — I5032 Chronic diastolic (congestive) heart failure: Secondary | ICD-10-CM | POA: Diagnosis present

## 2021-05-11 DIAGNOSIS — R1013 Epigastric pain: Secondary | ICD-10-CM | POA: Diagnosis not present

## 2021-05-11 DIAGNOSIS — M898X9 Other specified disorders of bone, unspecified site: Secondary | ICD-10-CM | POA: Diagnosis present

## 2021-05-11 DIAGNOSIS — Z28310 Unvaccinated for covid-19: Secondary | ICD-10-CM

## 2021-05-11 DIAGNOSIS — Z7902 Long term (current) use of antithrombotics/antiplatelets: Secondary | ICD-10-CM

## 2021-05-11 DIAGNOSIS — I10 Essential (primary) hypertension: Secondary | ICD-10-CM

## 2021-05-11 DIAGNOSIS — I132 Hypertensive heart and chronic kidney disease with heart failure and with stage 5 chronic kidney disease, or end stage renal disease: Secondary | ICD-10-CM | POA: Diagnosis present

## 2021-05-11 DIAGNOSIS — I2510 Atherosclerotic heart disease of native coronary artery without angina pectoris: Secondary | ICD-10-CM | POA: Diagnosis present

## 2021-05-11 DIAGNOSIS — Z955 Presence of coronary angioplasty implant and graft: Secondary | ICD-10-CM

## 2021-05-11 DIAGNOSIS — N186 End stage renal disease: Secondary | ICD-10-CM | POA: Diagnosis present

## 2021-05-11 DIAGNOSIS — Z87891 Personal history of nicotine dependence: Secondary | ICD-10-CM | POA: Diagnosis not present

## 2021-05-11 DIAGNOSIS — Z8551 Personal history of malignant neoplasm of bladder: Secondary | ICD-10-CM

## 2021-05-11 DIAGNOSIS — M1990 Unspecified osteoarthritis, unspecified site: Secondary | ICD-10-CM | POA: Diagnosis present

## 2021-05-11 DIAGNOSIS — N400 Enlarged prostate without lower urinary tract symptoms: Secondary | ICD-10-CM | POA: Diagnosis present

## 2021-05-11 DIAGNOSIS — Z79899 Other long term (current) drug therapy: Secondary | ICD-10-CM

## 2021-05-11 DIAGNOSIS — Z20822 Contact with and (suspected) exposure to covid-19: Secondary | ICD-10-CM | POA: Diagnosis present

## 2021-05-11 DIAGNOSIS — R079 Chest pain, unspecified: Secondary | ICD-10-CM

## 2021-05-11 DIAGNOSIS — R109 Unspecified abdominal pain: Secondary | ICD-10-CM | POA: Diagnosis present

## 2021-05-11 DIAGNOSIS — I252 Old myocardial infarction: Secondary | ICD-10-CM

## 2021-05-11 DIAGNOSIS — Z992 Dependence on renal dialysis: Secondary | ICD-10-CM

## 2021-05-11 DIAGNOSIS — K8020 Calculus of gallbladder without cholecystitis without obstruction: Principal | ICD-10-CM | POA: Diagnosis present

## 2021-05-11 DIAGNOSIS — G4733 Obstructive sleep apnea (adult) (pediatric): Secondary | ICD-10-CM | POA: Diagnosis present

## 2021-05-11 DIAGNOSIS — D631 Anemia in chronic kidney disease: Secondary | ICD-10-CM | POA: Diagnosis present

## 2021-05-11 DIAGNOSIS — Z888 Allergy status to other drugs, medicaments and biological substances status: Secondary | ICD-10-CM

## 2021-05-11 LAB — RESP PANEL BY RT-PCR (FLU A&B, COVID) ARPGX2
Influenza A by PCR: NEGATIVE
Influenza B by PCR: NEGATIVE
SARS Coronavirus 2 by RT PCR: NEGATIVE

## 2021-05-11 LAB — COMPREHENSIVE METABOLIC PANEL
ALT: 12 U/L (ref 0–44)
AST: 20 U/L (ref 15–41)
Albumin: 3.6 g/dL (ref 3.5–5.0)
Alkaline Phosphatase: 109 U/L (ref 38–126)
Anion gap: 10 (ref 5–15)
BUN: 35 mg/dL — ABNORMAL HIGH (ref 8–23)
CO2: 29 mmol/L (ref 22–32)
Calcium: 8.8 mg/dL — ABNORMAL LOW (ref 8.9–10.3)
Chloride: 97 mmol/L — ABNORMAL LOW (ref 98–111)
Creatinine, Ser: 4.38 mg/dL — ABNORMAL HIGH (ref 0.61–1.24)
GFR, Estimated: 13 mL/min — ABNORMAL LOW (ref >60)
Glucose, Bld: 108 mg/dL — ABNORMAL HIGH (ref 70–99)
Potassium: 4.4 mmol/L (ref 3.5–5.1)
Sodium: 136 mmol/L (ref 135–145)
Total Bilirubin: 0.5 mg/dL (ref 0.3–1.2)
Total Protein: 7.2 g/dL (ref 6.5–8.1)

## 2021-05-11 LAB — RENAL FUNCTION PANEL
Albumin: 3.7 g/dL (ref 3.5–5.0)
Anion gap: 9 (ref 5–15)
BUN: 37 mg/dL — ABNORMAL HIGH (ref 8–23)
CO2: 29 mmol/L (ref 22–32)
Calcium: 8.8 mg/dL — ABNORMAL LOW (ref 8.9–10.3)
Chloride: 96 mmol/L — ABNORMAL LOW (ref 98–111)
Creatinine, Ser: 4.43 mg/dL — ABNORMAL HIGH (ref 0.61–1.24)
GFR, Estimated: 13 mL/min — ABNORMAL LOW (ref >60)
Glucose, Bld: 102 mg/dL — ABNORMAL HIGH (ref 70–99)
Phosphorus: 2.4 mg/dL — ABNORMAL LOW (ref 2.5–4.6)
Potassium: 4.4 mmol/L (ref 3.5–5.1)
Sodium: 134 mmol/L — ABNORMAL LOW (ref 135–145)

## 2021-05-11 LAB — CBC WITH DIFFERENTIAL/PLATELET
Abs Immature Granulocytes: 0.02 10*3/uL (ref 0.00–0.07)
Basophils Absolute: 0 10*3/uL (ref 0.0–0.1)
Basophils Relative: 0 %
Eosinophils Absolute: 0.1 10*3/uL (ref 0.0–0.5)
Eosinophils Relative: 1 %
HCT: 29.3 % — ABNORMAL LOW (ref 39.0–52.0)
Hemoglobin: 9.4 g/dL — ABNORMAL LOW (ref 13.0–17.0)
Immature Granulocytes: 0 %
Lymphocytes Relative: 11 %
Lymphs Abs: 0.8 10*3/uL (ref 0.7–4.0)
MCH: 31.6 pg (ref 26.0–34.0)
MCHC: 32.1 g/dL (ref 30.0–36.0)
MCV: 98.7 fL (ref 80.0–100.0)
Monocytes Absolute: 0.4 10*3/uL (ref 0.1–1.0)
Monocytes Relative: 5 %
Neutro Abs: 6.3 10*3/uL (ref 1.7–7.7)
Neutrophils Relative %: 83 %
Platelets: 132 10*3/uL — ABNORMAL LOW (ref 150–400)
RBC: 2.97 MIL/uL — ABNORMAL LOW (ref 4.22–5.81)
RDW: 15.3 % (ref 11.5–15.5)
WBC: 7.7 10*3/uL (ref 4.0–10.5)
nRBC: 0 % (ref 0.0–0.2)

## 2021-05-11 LAB — LIPASE, BLOOD: Lipase: 54 U/L — ABNORMAL HIGH (ref 11–51)

## 2021-05-11 LAB — TROPONIN I (HIGH SENSITIVITY)
Troponin I (High Sensitivity): 39 ng/L — ABNORMAL HIGH (ref <18)
Troponin I (High Sensitivity): 35 ng/L — ABNORMAL HIGH (ref <18)

## 2021-05-11 MED ORDER — LIDOCAINE HCL (PF) 1 % IJ SOLN: 5.0000 mL | INTRAMUSCULAR | Status: DC | PRN

## 2021-05-11 MED ORDER — HYDROMORPHONE HCL 1 MG/ML IJ SOLN
1.0000 mg | Freq: Once | INTRAMUSCULAR | Status: AC
  Administered 2021-05-11: 03:00:00 1 mg via INTRAVENOUS
  Filled 2021-05-11: qty 1

## 2021-05-11 MED ORDER — TAMSULOSIN HCL 0.4 MG PO CAPS
0.4000 mg | ORAL_CAPSULE | Freq: Every day | ORAL | Status: DC
  Administered 2021-05-11 – 2021-05-12 (×2): 0.4 mg via ORAL
  Filled 2021-05-11 (×2): qty 1

## 2021-05-11 MED ORDER — SODIUM CHLORIDE 0.9 % IV SOLN: 100.0000 mL | INTRAVENOUS | Status: DC | PRN

## 2021-05-11 MED ORDER — ALUM & MAG HYDROXIDE-SIMETH 200-200-20 MG/5ML PO SUSP
30.0000 mL | Freq: Once | ORAL | Status: AC
  Administered 2021-05-11: 05:00:00 30 mL via ORAL
  Filled 2021-05-11: qty 30

## 2021-05-11 MED ORDER — ACETAMINOPHEN 325 MG PO TABS: 650.0000 mg | ORAL_TABLET | Freq: Four times a day (QID) | ORAL | Status: DC | PRN

## 2021-05-11 MED ORDER — PANTOPRAZOLE SODIUM 40 MG PO TBEC
40.0000 mg | DELAYED_RELEASE_TABLET | Freq: Every day | ORAL | Status: DC
  Administered 2021-05-11 – 2021-05-12 (×2): 40 mg via ORAL
  Filled 2021-05-11 (×2): qty 1

## 2021-05-11 MED ORDER — LIDOCAINE-PRILOCAINE 2.5-2.5 % EX CREA: 1.0000 "application " | TOPICAL_CREAM | CUTANEOUS | Status: DC | PRN

## 2021-05-11 MED ORDER — ATORVASTATIN CALCIUM 40 MG PO TABS
80.0000 mg | ORAL_TABLET | Freq: Every day | ORAL | Status: DC
  Administered 2021-05-11 – 2021-05-12 (×2): 80 mg via ORAL
  Filled 2021-05-11 (×2): qty 2

## 2021-05-11 MED ORDER — LIDOCAINE VISCOUS HCL 2 % MT SOLN
15.0000 mL | Freq: Once | OROMUCOSAL | Status: AC
  Administered 2021-05-11: 05:00:00 15 mL via ORAL
  Filled 2021-05-11: qty 15

## 2021-05-11 MED ORDER — CARVEDILOL 3.125 MG PO TABS
3.1250 mg | ORAL_TABLET | Freq: Two times a day (BID) | ORAL | Status: DC
  Administered 2021-05-11 (×2): 3.125 mg via ORAL
  Filled 2021-05-11 (×2): qty 1

## 2021-05-11 MED ORDER — FAMOTIDINE 20 MG PO TABS: 20.0000 mg | ORAL_TABLET | Freq: Every day | ORAL | Status: DC

## 2021-05-11 MED ORDER — ONDANSETRON HCL 4 MG/2ML IJ SOLN: 4.0000 mg | Freq: Four times a day (QID) | INTRAMUSCULAR | Status: DC | PRN

## 2021-05-11 MED ORDER — SODIUM CHLORIDE 0.9 % IV SOLN: INTRAVENOUS | Status: DC

## 2021-05-11 MED ORDER — ACETAMINOPHEN 650 MG RE SUPP: 650.0000 mg | Freq: Four times a day (QID) | RECTAL | Status: DC | PRN

## 2021-05-11 MED ORDER — HYDROMORPHONE HCL 1 MG/ML IJ SOLN: 1.0000 mg | INTRAMUSCULAR | Status: DC | PRN

## 2021-05-11 MED ORDER — ALTEPLASE 2 MG IJ SOLR: 2.0000 mg | Freq: Once | INTRAMUSCULAR | Status: DC | PRN

## 2021-05-11 MED ORDER — HEPARIN SODIUM (PORCINE) 5000 UNIT/ML IJ SOLN
5000.0000 [IU] | Freq: Three times a day (TID) | INTRAMUSCULAR | Status: DC
  Administered 2021-05-11 (×2): 5000 [IU] via SUBCUTANEOUS
  Filled 2021-05-11 (×3): qty 1

## 2021-05-11 MED ORDER — LEVOTHYROXINE SODIUM 50 MCG PO TABS
50.0000 ug | ORAL_TABLET | Freq: Every day | ORAL | Status: DC
  Administered 2021-05-12: 05:00:00 50 ug via ORAL
  Filled 2021-05-11: qty 1

## 2021-05-11 MED ORDER — FINASTERIDE 5 MG PO TABS
5.0000 mg | ORAL_TABLET | Freq: Every day | ORAL | Status: DC
  Administered 2021-05-11 – 2021-05-12 (×2): 5 mg via ORAL
  Filled 2021-05-11 (×2): qty 1

## 2021-05-11 MED ORDER — CLOPIDOGREL BISULFATE 75 MG PO TABS
75.0000 mg | ORAL_TABLET | Freq: Every day | ORAL | Status: DC
  Administered 2021-05-11 – 2021-05-12 (×2): 75 mg via ORAL
  Filled 2021-05-11 (×2): qty 1

## 2021-05-11 MED ORDER — OMEGA-3-ACID ETHYL ESTERS 1 G PO CAPS
2000.0000 mg | ORAL_CAPSULE | Freq: Two times a day (BID) | ORAL | Status: DC
  Administered 2021-05-11 – 2021-05-12 (×3): 2000 mg via ORAL
  Filled 2021-05-11 (×3): qty 2

## 2021-05-11 MED ORDER — FOLIC ACID 1 MG PO TABS
1.0000 mg | ORAL_TABLET | Freq: Every day | ORAL | Status: DC
  Administered 2021-05-11 – 2021-05-12 (×2): 1 mg via ORAL
  Filled 2021-05-11 (×2): qty 1

## 2021-05-11 MED ORDER — CHLORHEXIDINE GLUCONATE CLOTH 2 % EX PADS
6.0000 | MEDICATED_PAD | Freq: Every day | CUTANEOUS | Status: DC
  Administered 2021-05-11 – 2021-05-12 (×2): 6 via TOPICAL

## 2021-05-11 MED ORDER — ISOSORBIDE MONONITRATE ER 60 MG PO TB24
120.0000 mg | ORAL_TABLET | Freq: Every day | ORAL | Status: DC
  Administered 2021-05-11: 15:00:00 120 mg via ORAL
  Filled 2021-05-11: qty 2

## 2021-05-11 MED ORDER — NITROGLYCERIN 0.4 MG SL SUBL: 0.4000 mg | SUBLINGUAL_TABLET | SUBLINGUAL | Status: DC | PRN

## 2021-05-11 MED ORDER — FAMOTIDINE 20 MG PO TABS: 20.0000 mg | ORAL_TABLET | Freq: Two times a day (BID) | ORAL | Status: DC

## 2021-05-11 MED ORDER — TECHNETIUM TC 99M MEBROFENIN IV KIT
5.0000 | PACK | Freq: Once | INTRAVENOUS | Status: AC | PRN
  Administered 2021-05-11: 13:00:00 5.5 via INTRAVENOUS

## 2021-05-11 MED ORDER — ONDANSETRON HCL 4 MG PO TABS: 4.0000 mg | ORAL_TABLET | Freq: Four times a day (QID) | ORAL | Status: DC | PRN

## 2021-05-11 MED ORDER — METOCLOPRAMIDE HCL 5 MG/ML IJ SOLN
10.0000 mg | Freq: Once | INTRAMUSCULAR | Status: DC
  Filled 2021-05-11: qty 2

## 2021-05-11 MED ORDER — PENTAFLUOROPROP-TETRAFLUOROETH EX AERO: 1.0000 "application " | INHALATION_SPRAY | CUTANEOUS | Status: DC | PRN

## 2021-05-11 MED ORDER — FAMOTIDINE 20 MG PO TABS
40.0000 mg | ORAL_TABLET | Freq: Once | ORAL | Status: AC
  Administered 2021-05-11: 05:00:00 40 mg via ORAL
  Filled 2021-05-11: qty 2

## 2021-05-11 MED ORDER — HEPARIN SODIUM (PORCINE) 1000 UNIT/ML DIALYSIS: 1000.0000 [IU] | INTRAMUSCULAR | Status: DC | PRN

## 2021-05-11 MED ORDER — SODIUM CHLORIDE 0.9 % IV BOLUS
500.0000 mL | Freq: Once | INTRAVENOUS | Status: AC
  Administered 2021-05-11: 03:00:00 500 mL via INTRAVENOUS

## 2021-05-11 MED ORDER — ONDANSETRON HCL 4 MG/2ML IJ SOLN
4.0000 mg | Freq: Once | INTRAMUSCULAR | Status: AC
  Administered 2021-05-11: 03:00:00 4 mg via INTRAVENOUS
  Filled 2021-05-11: qty 2

## 2021-05-11 NOTE — Consult Note (Signed)
Reason for Consult: Right upper quadrant abdominal pain, known history of cholelithiasis Referring Physician: Dr. Elvin So [name] is an 75 y.o. male.  HPI: Patient is a 75 year old white male with multiple medical problems including hypertension, coronary artery disease, carotid artery disease, end-stage renal disease, and history of congestive heart failure who went out to eat yesterday evening with his wife.  He had some corn and earlier today developed right upper quadrant abdominal pain and nausea.  He denies any emesis.  He had a previous admission in November of this year and was diagnosed with cholelithiasis.  It was felt he did have biliary colic, but it did resolve.  He did not have surgical intervention due to significant elevation of his troponins.  He is followed by both doctors in this area as well as the [place].  He has been trying to watch his diet since November and has not had any biliary colic until this morning.  He has been referred to a GI doc in the [place] system for further management and treatment.  He does currently take Plavix.  He was admitted to the hospital for further evaluation and treatment.  He states his pain is now gone.  He has no nausea.  He denies any fever, chills, jaundice.  Past Medical History:  Diagnosis Date   Acute myocardial infarction of other inferior wall, initial episode of care    Anemia    Anginal pain ([place])    Anxiety    Arthritis    Cancer ([place])    bladder 2013 removed, has cystoscopy 10/2016, has returned x 2   COPD (chronic obstructive pulmonary disease) ([place])    Coronary artery disease    Artery bypass graft Jan 2008   Dialysis patient [facility])    Dyspnea    Dysrhythmia    GERD (gastroesophageal reflux disease)    Heart murmur    History of hiatal hernia    Hyperlipidemia    Hypertension    Hypothyroidism    PONV (postoperative nausea and vomiting)    Renal disorder    Sleep apnea    Stroke [facility])    Tobacco user      Past Surgical History:  Procedure Laterality Date   APPENDECTOMY     AV FISTULA PLACEMENT Right 02/11/2020   Procedure: RIGHT ARM BRACHIOCEPHALIC ARTERIOVENOUS (AV) FISTULA;  Surgeon: Angelia Mould, MD;  Location: [facility];  Service: Vascular;  Laterality: Right;   CARDIAC CATHETERIZATION     COLONOSCOPY WITH PROPOFOL N/A 09/02/2020   Procedure: COLONOSCOPY WITH PROPOFOL;  Surgeon: Rogene Houston, MD;  [facility];  Service: Endoscopy;  Laterality: N/A;   CORONARY ARTERY BYPASS GRAFT  06/14/2006   x 5   CORONARY ARTERY BYPASS GRAFT N/A 09/19/2016   Procedure: REDO CORONARY ARTERY BYPASS GRAFTING (CABG) x two, using right internal mammary artery and left radial artery;  Surgeon: [name], MD;  Location: [place];  Service: Open Heart Surgery;  Laterality: N/A;   FOOT SURGERY     HEMOSTASIS CLIP PLACEMENT  09/02/2020   Procedure: HEMOSTASIS CLIP PLACEMENT;  Surgeon: Rogene Houston, MD;  [facility];  Service: Endoscopy;;   HERNIA REPAIR     HOT HEMOSTASIS  09/02/2020   Procedure: HOT HEMOSTASIS (ARGON PLASMA COAGULATION/BICAP);  Surgeon: Rogene Houston, MD;  [facility];  Service: Endoscopy;;   LEFT HEART CATH AND CORS/GRAFTS ANGIOGRAPHY N/A 09/14/2016   Procedure: Left Heart Cath and Cors/Grafts Angiography;  Surgeon: Troy Sine, MD;  Location: [facility];  Service: Cardiovascular;  Laterality: N/A;   LEFT HEART CATHETERIZATION WITH CORONARY/GRAFT ANGIOGRAM N/A 01/31/2014   Procedure: LEFT HEART CATHETERIZATION WITH Beatrix Fetters;  Surgeon: Sanda Klein, MD;  Location: [facility] CATH LAB;  Service: Cardiovascular;  Laterality: N/A;   NOSE SURGERY     PERCUTANEOUS CORONARY STENT INTERVENTION (PCI-S)  01/31/2014   Procedure: PERCUTANEOUS CORONARY STENT INTERVENTION (PCI-S);  Surgeon: Sanda Klein, MD;  [facility];  Service: Cardiovascular;;   POLYPECTOMY  09/02/2020   Procedure: POLYPECTOMY;  Surgeon: Rogene Houston, MD;  [facility];  Service: Endoscopy;;   RADIAL ARTERY HARVEST Left 09/19/2016   Procedure: RADIAL ARTERY HARVEST;  Surgeon: [name], MD;  Location: [facility];  Service: Open Heart Surgery;  Laterality: Left;   TEE WITHOUT CARDIOVERSION N/A 09/19/2016   Procedure: TRANSESOPHAGEAL ECHOCARDIOGRAM (TEE);  Surgeon: [name], MD;  Location: [facility];  Service: Open Heart Surgery;  Laterality: N/A;    Family History  Problem Relation Age of Onset   Heart attack Mother    Heart attack Brother    Colon cancer Neg Hx    Colon polyps Neg Hx     Social History:  reports that he quit smoking about 4 years ago. His smoking use included cigarettes. He started smoking about 62 years ago. He has a 40.00 pack-year smoking history. He has never used smokeless tobacco. He reports that he does not drink alcohol and does not use drugs.  Allergies:  Allergies  Allergen Reactions   Cardizem Cd [Diltiazem Hcl Er Beads] Palpitations    "Makes heart skip"   Cardizem [Diltiazem]     Medications: I have reviewed the patient's current medications.  Results for orders placed or performed during the hospital encounter of 05/11/21 (from the past 48 hour(s))  CBC with Differential/Platelet     Status: Abnormal   Collection Time: 05/11/21  3:15 AM  Result Value Ref Range   WBC 7.7 4.0 - 10.5 K/uL   RBC 2.97 (L) 4.22 - 5.81 MIL/uL   Hemoglobin 9.4 (L) 13.0 - 17.0 g/dL   HCT 29.3 (L) 39.0 - 52.0 %   MCV 98.7 80.0 - 100.0 fL   MCH 31.6 26.0 - 34.0 pg   MCHC 32.1 30.0 - 36.0 g/dL   RDW 15.3 11.5 - 15.5 %   Platelets 132 (L) 150 - 400 K/uL   nRBC 0.0 0.0 - 0.2 %   Neutrophils Relative % 83 %   Neutro Abs 6.3 1.7 - 7.7 K/uL   Lymphocytes Relative 11 %   Lymphs Abs 0.8 0.7 - 4.0 K/uL   Monocytes Relative 5 %   Monocytes Absolute 0.4 0.1 - 1.0 K/uL   Eosinophils Relative 1 %   Eosinophils Absolute 0.1 0.0 - 0.5 K/uL   Basophils Relative 0 %   Basophils Absolute 0.0 0.0 - 0.1 K/uL    Immature Granulocytes 0 %   Abs Immature Granulocytes 0.02 0.00 - 0.07 K/uL    Comment: Performed at [facility], [address], [place], [place] [address]  Comprehensive metabolic panel     Status: Abnormal   Collection Time: 05/11/21  3:15 AM  Result Value Ref Range   Sodium 136 135 - 145 mmol/L   Potassium 4.4 3.5 - 5.1 mmol/L   Chloride 97 (L) 98 - 111 mmol/L   CO2 29 22 - 32 mmol/L   Glucose, Bld 108 (H) 70 - 99  mg/dL    Comment: Glucose reference range applies only to samples taken after fasting for at least 8 hours.   BUN 35 (H) 8 - 23 mg/dL   Creatinine, Ser 4.38 (H) 0.61 - 1.24 mg/dL   Calcium 8.8 (L) 8.9 - 10.3 mg/dL   Total Protein 7.2 6.5 - 8.1 g/dL   Albumin 3.6 3.5 - 5.0 g/dL   AST 20 15 - 41 U/L   ALT 12 0 - 44 U/L   Alkaline Phosphatase 109 38 - 126 U/L   Total Bilirubin 0.5 0.3 - 1.2 mg/dL   GFR, Estimated 13 (L) >60 mL/min    Comment: (NOTE) Calculated using the CKD-EPI Creatinine Equation (2021)    Anion gap 10 5 - 15    Comment: Performed at [facility], [address] [place], [place] [address]  Lipase, blood     Status: Abnormal   Collection Time: 05/11/21  3:15 AM  Result Value Ref Range   Lipase 54 (H) 11 - 51 U/L    Comment: Performed at [facility], [address], [place], [place] [address]  Troponin I (High Sensitivity)     Status: Abnormal   Collection Time: 05/11/21  3:15 AM  Result Value Ref Range   Troponin I (High Sensitivity) 39 (H) <18 ng/L    Comment: (NOTE) Elevated high sensitivity troponin I (hsTnI) values and significant  changes across serial measurements may suggest ACS but many other  chronic and acute conditions are known to elevate hsTnI results.  Refer to the "Links" section for chest pain algorithms and additional  guidance. Performed at [facility], [address], [place], [place] [address]   Troponin I (High Sensitivity)     Status: Abnormal   Collection Time: 05/11/21  4:55 AM  Result Value Ref Range   Troponin I  (High Sensitivity) 35 (H) <18 ng/L    Comment: (NOTE) Elevated high sensitivity troponin I (hsTnI) values and significant  changes across serial measurements may suggest ACS but many other  chronic and acute conditions are known to elevate hsTnI results.  Refer to the "Links" section for chest pain algorithms and additional  guidance. Performed at [facility], [address], [place], [place] [address]   Resp Panel by RT-PCR (Flu A&B, Covid) Nasopharyngeal Swab     Status: None   Collection Time: 05/11/21  5:10 AM   Specimen: Nasopharyngeal Swab; Nasopharyngeal(NP) swabs in vial transport medium  Result Value Ref Range   SARS Coronavirus 2 by RT PCR NEGATIVE NEGATIVE    Comment: (NOTE) SARS-CoV-2 target nucleic acids are NOT DETECTED.  The SARS-CoV-2 RNA is generally detectable in upper respiratory specimens during the acute phase of infection. The lowest concentration of SARS-CoV-2 viral copies this assay can detect is 138 copies/mL. A negative result does not preclude SARS-Cov-2 infection and should not be used as the sole basis for treatment or other patient management decisions. A negative result may occur with  improper specimen collection/handling, submission of specimen other than nasopharyngeal swab, presence of viral mutation(s) within the areas targeted by this assay, and inadequate number of viral copies(<138 copies/mL). A negative result must be combined with clinical observations, patient history, and epidemiological information. The expected result is Negative.  Fact Sheet for Patients:  EntrepreneurPulse.com.au  Fact Sheet for Healthcare Providers:  IncredibleEmployment.be  This test is no t yet approved or cleared by the [place] FDA and  has been authorized for detection and/or diagnosis of SARS-CoV-2 by FDA under an Emergency Use  Authorization (EUA). This EUA will remain  in effect (meaning this test can be used) for the  duration of the COVID-19 declaration under Section 564(b)(1) of the Act, 21 U.S.C.section 360bbb-3(b)(1), unless the authorization is terminated  or revoked sooner.       Influenza A by PCR NEGATIVE NEGATIVE   Influenza B by PCR NEGATIVE NEGATIVE    Comment: (NOTE) The Xpert Xpress SARS-CoV-2/FLU/RSV plus assay is intended as an aid in the diagnosis of influenza from Nasopharyngeal swab specimens and should not be used as a sole basis for treatment. Nasal washings and aspirates are unacceptable for Xpert Xpress SARS-CoV-2/FLU/RSV testing.  Fact Sheet for Patients: EntrepreneurPulse.com.au  Fact Sheet for Healthcare Providers: IncredibleEmployment.be  This test is not yet approved or cleared by the [place] FDA and has been authorized for detection and/or diagnosis of SARS-CoV-2 by FDA under an Emergency Use Authorization (EUA). This EUA will remain in effect (meaning this test can be used) for the duration of the COVID-19 declaration under Section 564(b)(1) of the Act, 21 U.S.C. section 360bbb-3(b)(1), unless the authorization is terminated or revoked.  Performed at [facility], [address], [place], [place] [address]     No results found.  ROS:  Pertinent items are noted in HPI.  Blood pressure (!) 164/60, pulse 82, temperature 97.7 F (36.5 C), temperature source Oral, resp. rate 18, height 5\' 9"  (1.753 m), weight 83.4 kg, SpO2 95 %. Physical Exam: Pleasant white male no acute distress Head is normocephalic, atraumatic Lungs clear to auscultation with good breath sounds bilaterally Heart examination reveals a regular rate and rhythm Abdomen is soft, nontender, nondistended.  No hepatosplenomegaly is noted.  Labs all reviewed Assessment/Plan: Impression: Biliary colic secondary to cholelithiasis, resolved.  Patient does not have evidence of acute cholecystitis at this point.  We will get HIDA scan to further assess.  Given  the patient's multiple comorbidities, he may need to be referred to a tertiary care center for further evaluation and treatment of his gallbladder.  No need for cholecystectomy at this point.  May resume diet once HIDA scan is finished.  Aviva Signs 05/11/2021, 11:42 AM

## 2021-05-11 NOTE — Consult Note (Signed)
[name] Consult Note: Reason for Consult: To manage dialysis and dialysis related needs Referring Physician: Dr. Dyann Kief, [name]  HPI:  [name] is an 75 y.o. male with history of HTN, CAD, CHF, BPH, ESRD on HD MWF at [facility], cholelithiasis who presents to the hospital with nausea and abdominal pain, seen as a consultation for the management of ESRD.  The patient was recently discharged on 11/25 when he was admitted with similar condition.  He was seen by general surgeon but decided no need for acute surgical intervention at that time especially because of elevated troponin.  Seen by cardiologist as well and decided to treat medically.  He now presents with worsening GI symptoms mainly abdominal pain. Patient straight his last dialysis was yesterday without any event.  He denies headache, dizziness, chest pain, shortness of breath. In the ER, the initial blood pressure was elevated probably because of pain in distress.  Now the blood pressure has improved.  Potassium level is 4.4, BUN 35, creatinine 4.3, hemoglobin 9.4, WBC 7.7.  Influenza and COVID negative.  Dialysis Orders from previous visit: Center: [facility]  on MWF . EDW 83 kg HD Bath 2K/2.5Ca  Time 4 hours Heparin none. Access  RUE AVF BFR 400 DFR 500     Past Medical History:  Diagnosis Date   Acute myocardial infarction of other inferior wall, initial episode of care    Anemia    Anginal pain ([place])    Anxiety    Arthritis    Cancer ([place])    bladder 2013 removed, has cystoscopy 10/2016, has returned x 2   COPD (chronic obstructive pulmonary disease) ([place])    Coronary artery disease    Artery bypass graft Jan 2008   Dialysis patient [facility])    Dyspnea    Dysrhythmia    GERD (gastroesophageal reflux disease)    Heart murmur    History of hiatal hernia    Hyperlipidemia    Hypertension    Hypothyroidism    PONV (postoperative nausea and vomiting)    Renal disorder    Sleep apnea     Stroke [facility])    Tobacco user     Past Surgical History:  Procedure Laterality Date   APPENDECTOMY     AV FISTULA PLACEMENT Right 02/11/2020   Procedure: RIGHT ARM BRACHIOCEPHALIC ARTERIOVENOUS (AV) FISTULA;  Surgeon: Angelia Mould, MD;  Location: [place];  Service: Vascular;  Laterality: Right;   CARDIAC CATHETERIZATION     COLONOSCOPY WITH PROPOFOL N/A 09/02/2020   Procedure: COLONOSCOPY WITH PROPOFOL;  Surgeon: Rogene Houston, MD;  [facility];  Service: Endoscopy;  Laterality: N/A;   CORONARY ARTERY BYPASS GRAFT  06/14/2006   x 5   CORONARY ARTERY BYPASS GRAFT N/A 09/19/2016   Procedure: REDO CORONARY ARTERY BYPASS GRAFTING (CABG) x two, using right internal mammary artery and left radial artery;  Surgeon: [name], MD;  Location: [place];  Service: Open Heart Surgery;  Laterality: N/A;   FOOT SURGERY     HEMOSTASIS CLIP PLACEMENT  09/02/2020   Procedure: HEMOSTASIS CLIP PLACEMENT;  Surgeon: Rogene Houston, MD;  [facility];  Service: Endoscopy;;   HERNIA REPAIR     HOT HEMOSTASIS  09/02/2020   Procedure: HOT HEMOSTASIS (ARGON PLASMA COAGULATION/BICAP);  Surgeon: Rogene Houston, MD;  [facility];  Service: Endoscopy;;   LEFT HEART CATH AND CORS/GRAFTS ANGIOGRAPHY N/A 09/14/2016   Procedure: Left Heart Cath and Cors/Grafts  Angiography;  Surgeon: Troy Sine, MD;  Location: [place] CV LAB;  Service: Cardiovascular;  Laterality: N/A;   LEFT HEART CATHETERIZATION WITH CORONARY/GRAFT ANGIOGRAM N/A 01/31/2014   Procedure: LEFT HEART CATHETERIZATION WITH Beatrix Fetters;  Surgeon: Sanda Klein, MD;  Location: [place] CATH LAB;  Service: Cardiovascular;  Laterality: N/A;   NOSE SURGERY     PERCUTANEOUS CORONARY STENT INTERVENTION (PCI-S)  01/31/2014   Procedure: PERCUTANEOUS CORONARY STENT INTERVENTION (PCI-S);  Surgeon: Sanda Klein, MD;  Location: [facility] CATH LAB;  Service: Cardiovascular;;   POLYPECTOMY  09/02/2020   Procedure:  POLYPECTOMY;  Surgeon: Rogene Houston, MD;  [facility];  Service: Endoscopy;;   RADIAL ARTERY HARVEST Left 09/19/2016   Procedure: RADIAL ARTERY HARVEST;  Surgeon: [name], MD;  Location: [place];  Service: Open Heart Surgery;  Laterality: Left;   TEE WITHOUT CARDIOVERSION N/A 09/19/2016   Procedure: TRANSESOPHAGEAL ECHOCARDIOGRAM (TEE);  Surgeon: [name], MD;  Location: [place];  Service: Open Heart Surgery;  Laterality: N/A;    Family History  Problem Relation Age of Onset   Heart attack Mother    Heart attack Brother    Colon cancer Neg Hx    Colon polyps Neg Hx     Social History:  reports that he quit smoking about 4 years ago. His smoking use included cigarettes. He started smoking about 62 years ago. He has a 40.00 pack-year smoking history. He has never used smokeless tobacco. He reports that he does not drink alcohol and does not use drugs.  Allergies:  Allergies  Allergen Reactions   Cardizem Cd [Diltiazem Hcl Er Beads] Palpitations    "Makes heart skip"   Cardizem [Diltiazem]     Medications: I have reviewed the patient's current medications.   Results for orders placed or performed during the hospital encounter of 05/11/21 (from the past 48 hour(s))  CBC with Differential/Platelet     Status: Abnormal   Collection Time: 05/11/21  3:15 AM  Result Value Ref Range   WBC 7.7 4.0 - 10.5 K/uL   RBC 2.97 (L) 4.22 - 5.81 MIL/uL   Hemoglobin 9.4 (L) 13.0 - 17.0 g/dL   HCT 29.3 (L) 39.0 - 52.0 %   MCV 98.7 80.0 - 100.0 fL   MCH 31.6 26.0 - 34.0 pg   MCHC 32.1 30.0 - 36.0 g/dL   RDW 15.3 11.5 - 15.5 %   Platelets 132 (L) 150 - 400 K/uL   nRBC 0.0 0.0 - 0.2 %   Neutrophils Relative % 83 %   Neutro Abs 6.3 1.7 - 7.7 K/uL   Lymphocytes Relative 11 %   Lymphs Abs 0.8 0.7 - 4.0 K/uL   Monocytes Relative 5 %   Monocytes Absolute 0.4 0.1 - 1.0 K/uL   Eosinophils Relative 1 %   Eosinophils Absolute 0.1 0.0 - 0.5 K/uL   Basophils Relative 0 %    Basophils Absolute 0.0 0.0 - 0.1 K/uL   Immature Granulocytes 0 %   Abs Immature Granulocytes 0.02 0.00 - 0.07 K/uL    Comment: Performed at [facility], [address] [place], [place] [address]  Comprehensive metabolic panel     Status: Abnormal   Collection Time: 05/11/21  3:15 AM  Result Value Ref Range   Sodium 136 135 - 145 mmol/L   Potassium 4.4 3.5 - 5.1 mmol/L   Chloride 97 (L) 98 - 111 mmol/L   CO2 29 22 - 32 mmol/L   Glucose, Bld 108 (H)  70 - 99 mg/dL    Comment: Glucose reference range applies only to samples taken after fasting for at least 8 hours.   BUN 35 (H) 8 - 23 mg/dL   Creatinine, Ser 4.38 (H) 0.61 - 1.24 mg/dL   Calcium 8.8 (L) 8.9 - 10.3 mg/dL   Total Protein 7.2 6.5 - 8.1 g/dL   Albumin 3.6 3.5 - 5.0 g/dL   AST 20 15 - 41 U/L   ALT 12 0 - 44 U/L   Alkaline Phosphatase 109 38 - 126 U/L   Total Bilirubin 0.5 0.3 - 1.2 mg/dL   GFR, Estimated 13 (L) >60 mL/min    Comment: (NOTE) Calculated using the CKD-EPI Creatinine Equation (2021)    Anion gap 10 5 - 15    Comment: Performed at [facility], [address], [place], [place] [address]  Lipase, blood     Status: Abnormal   Collection Time: 05/11/21  3:15 AM  Result Value Ref Range   Lipase 54 (H) 11 - 51 U/L    Comment: Performed at [facility], [address] [place], [place] [address]  Troponin I (High Sensitivity)     Status: Abnormal   Collection Time: 05/11/21  3:15 AM  Result Value Ref Range   Troponin I (High Sensitivity) 39 (H) <18 ng/L    Comment: (NOTE) Elevated high sensitivity troponin I (hsTnI) values and significant  changes across serial measurements may suggest ACS but many other  chronic and acute conditions are known to elevate hsTnI results.  Refer to the "Links" section for chest pain algorithms and additional  guidance. Performed at [facility], [address] [place], [place] [address]   Troponin I (High Sensitivity)     Status: Abnormal   Collection Time: 05/11/21  4:55 AM   Result Value Ref Range   Troponin I (High Sensitivity) 35 (H) <18 ng/L    Comment: (NOTE) Elevated high sensitivity troponin I (hsTnI) values and significant  changes across serial measurements may suggest ACS but many other  chronic and acute conditions are known to elevate hsTnI results.  Refer to the "Links" section for chest pain algorithms and additional  guidance. Performed at [facility], [address], [place], [place] [address]   Resp Panel by RT-PCR (Flu A&B, Covid) Nasopharyngeal Swab     Status: None   Collection Time: 05/11/21  5:10 AM   Specimen: Nasopharyngeal Swab; Nasopharyngeal(NP) swabs in vial transport medium  Result Value Ref Range   SARS Coronavirus 2 by RT PCR NEGATIVE NEGATIVE    Comment: (NOTE) SARS-CoV-2 target nucleic acids are NOT DETECTED.  The SARS-CoV-2 RNA is generally detectable in upper respiratory specimens during the acute phase of infection. The lowest concentration of SARS-CoV-2 viral copies this assay can detect is 138 copies/mL. A negative result does not preclude SARS-Cov-2 infection and should not be used as the sole basis for treatment or other patient management decisions. A negative result may occur with  improper specimen collection/handling, submission of specimen other than nasopharyngeal swab, presence of viral mutation(s) within the areas targeted by this assay, and inadequate number of viral copies(<138 copies/mL). A negative result must be combined with clinical observations, patient history, and epidemiological information. The expected result is Negative.  Fact Sheet for Patients:  EntrepreneurPulse.com.au  Fact Sheet for Healthcare Providers:  IncredibleEmployment.be  This test is no t yet approved or cleared by the [place] FDA and  has been authorized for detection and/or diagnosis of SARS-CoV-2 by FDA under  an Emergency Use Authorization (EUA). This EUA will remain  in effect  (meaning this test can be used) for the duration of the COVID-19 declaration under Section 564(b)(1) of the Act, 21 U.S.C.section 360bbb-3(b)(1), unless the authorization is terminated  or revoked sooner.       Influenza A by PCR NEGATIVE NEGATIVE   Influenza B by PCR NEGATIVE NEGATIVE    Comment: (NOTE) The Xpert Xpress SARS-CoV-2/FLU/RSV plus assay is intended as an aid in the diagnosis of influenza from Nasopharyngeal swab specimens and should not be used as a sole basis for treatment. Nasal washings and aspirates are unacceptable for Xpert Xpress SARS-CoV-2/FLU/RSV testing.  Fact Sheet for Patients: EntrepreneurPulse.com.au  Fact Sheet for Healthcare Providers: IncredibleEmployment.be  This test is not yet approved or cleared by the [place] FDA and has been authorized for detection and/or diagnosis of SARS-CoV-2 by FDA under an Emergency Use Authorization (EUA). This EUA will remain in effect (meaning this test can be used) for the duration of the COVID-19 declaration under Section 564(b)(1) of the Act, 21 U.S.C. section 360bbb-3(b)(1), unless the authorization is terminated or revoked.  Performed at [facility], [address] [place], [place] [address]     No results found.  ROS: As per H&P, rest systems reviewed and negative. Blood pressure (!) 160/45, pulse 65, temperature 98.4 F (36.9 C), temperature source Oral, resp. rate (!) 22, SpO2 100 %. Gen: NAD, comfortable Respiratory: Clear bilateral, no wheezing or crackle Cardiovascular: Regular rate rhythm S1-S2 normal, no rubs GI: Abdomen soft,  nondistended Extremities, no cyanosis or clubbing, no edema Skin: No rash or ulcer Neurology: Alert, awake, following commands, oriented Dialysis Access: Right upper extremity AV fistula has good thrill and bruit.  Assessment/Plan:  #Cholelithiasis with nausea and abdominal pain: This is his second admission.  General surgery  is already contacted.  Further plan per surgery team.  # ESRD: MWF at [facility]: Last HD yesterday.  Electrolytes and volume status acceptable therefore no need for dialysis today.  We will plan for regular HD tomorrow.  Right upper extremity AV fistula for the access.  # Hypertension: Blood pressure and volume status acceptable.  Resume home antihypertensives.  I will discontinue IV fluid. He already received 500 cc IVF bolus in ER.  # Anemia of ESRD: Hemoglobin below goal.  I will hold off iron because of possible gallbladder infection.  Resume ESA with HD.  # Metabolic Bone Disease: Monitor calcium phosphorus level.  He is currently NPO.  Thank you for the consult.  We will follow with you.  Discussed with the primary team.  Rosita Fire 05/11/2021, 8:39 AM

## 2021-05-11 NOTE — Progress Notes (Signed)
Tele called to notify this nurse that patient was having runs of Bigem[place]. Dr. Cathlean Sauer notified via text.

## 2021-05-11 NOTE — H&P (Signed)
History and Physical    [name] NOB:[id number] DOB: 02-26-1946 DOA: 05/11/2021  PCP: [facility]   Patient coming from: home   I have personally briefly reviewed patient's old medical records in [facility]  Chief Complaint: abd pain, nausea, vomiting.  HPI: [name] is a 75 y.o. male with medical history significant of end-stage renal disease on hemodialysis (Monday, Wednesday), history of bladder cancer diagnosed in 2013 actively followed by urologist at [facility] (last visit suggesting remission), history of BPH, hypertension, hyperlipidemia, hypothyroidism, gastroesophageal reflux disease, history of coronary artery disease and chronic diastolic heart failure; who presented to the emergency department secondary to abdominal pain, nausea and vomiting.  Pain is mainly localized in his right upper quadrant and radiates across; patient reports decreased appetite and also worsening symptoms with oral intake. No fever, no hematemesis, no shortness of breath, no chest pain, no palpitations, no focal weakness.  Of note, patient with similar presentation that required admission on 04/05/21, at that time treated medically and discharged home; his visit on that admission was complicated with NSTEMI and he was found to be not a candidate for heart cath, condition stabilized after 48 hours of heparin drip and conservative/medical management.  Patient is not vaccinated against COVID; COVID PCR in the ER negative.  ED Course: afebrile, hemodynamically stable, flat troponin elevation 39>>35, normal WBC's and unremarkable electrolytes level and a stable creatinine for him (last dialysis treatment on 05/10/2021).  Patient received antiemetics and analgesics while in the ED.  TRH and general surgery has been consulted at time of admission.  Review of Systems: As per HPI otherwise all other systems reviewed and are negative.  Past Medical History:  Diagnosis Date   Acute  myocardial infarction of other inferior wall, initial episode of care    Anemia    Anginal pain ([place])    Anxiety    Arthritis    Cancer ([place])    bladder 2013 removed, has cystoscopy 10/2016, has returned x 2   COPD (chronic obstructive pulmonary disease) ([place])    Coronary artery disease    Artery bypass graft Jan 2008   Dialysis patient [facility])    Dyspnea    Dysrhythmia    GERD (gastroesophageal reflux disease)    Heart murmur    History of hiatal hernia    Hyperlipidemia    Hypertension    Hypothyroidism    PONV (postoperative nausea and vomiting)    Renal disorder    Sleep apnea    Stroke [facility])    Tobacco user     Past Surgical History:  Procedure Laterality Date   APPENDECTOMY     AV FISTULA PLACEMENT Right 02/11/2020   Procedure: RIGHT ARM BRACHIOCEPHALIC ARTERIOVENOUS (AV) FISTULA;  Surgeon: Angelia Mould, MD;  Location: [place];  Service: Vascular;  Laterality: Right;   CARDIAC CATHETERIZATION     COLONOSCOPY WITH PROPOFOL N/A 09/02/2020   Procedure: COLONOSCOPY WITH PROPOFOL;  Surgeon: Rogene Houston, MD;  [facility];  Service: Endoscopy;  Laterality: N/A;   CORONARY ARTERY BYPASS GRAFT  06/14/2006   x 5   CORONARY ARTERY BYPASS GRAFT N/A 09/19/2016   Procedure: REDO CORONARY ARTERY BYPASS GRAFTING (CABG) x two, using right internal mammary artery and left radial artery;  Surgeon: [name], MD;  Location: [place];  Service: Open Heart Surgery;  Laterality: N/A;   FOOT SURGERY     HEMOSTASIS CLIP PLACEMENT  09/02/2020   Procedure: HEMOSTASIS CLIP PLACEMENT;  Surgeon: Rogene Houston, MD;  [facility];  Service: Endoscopy;;   HERNIA REPAIR     HOT HEMOSTASIS  09/02/2020   Procedure: HOT HEMOSTASIS (ARGON PLASMA COAGULATION/BICAP);  Surgeon: Rogene Houston, MD;  [facility];  Service: Endoscopy;;   LEFT HEART CATH AND CORS/GRAFTS ANGIOGRAPHY N/A 09/14/2016   Procedure: Left Heart Cath and Cors/Grafts Angiography;  Surgeon:  Troy Sine, MD;  Location: [place] CV LAB;  Service: Cardiovascular;  Laterality: N/A;   LEFT HEART CATHETERIZATION WITH CORONARY/GRAFT ANGIOGRAM N/A 01/31/2014   Procedure: LEFT HEART CATHETERIZATION WITH Beatrix Fetters;  Surgeon: Sanda Klein, MD;  Location: [facility] CATH LAB;  Service: Cardiovascular;  Laterality: N/A;   NOSE SURGERY     PERCUTANEOUS CORONARY STENT INTERVENTION (PCI-S)  01/31/2014   Procedure: PERCUTANEOUS CORONARY STENT INTERVENTION (PCI-S);  Surgeon: Sanda Klein, MD;  Location: [facility] CATH LAB;  Service: Cardiovascular;;   POLYPECTOMY  09/02/2020   Procedure: POLYPECTOMY;  Surgeon: Rogene Houston, MD;  [facility];  Service: Endoscopy;;   RADIAL ARTERY HARVEST Left 09/19/2016   Procedure: RADIAL ARTERY HARVEST;  Surgeon: [name], MD;  Location: [place];  Service: Open Heart Surgery;  Laterality: Left;   TEE WITHOUT CARDIOVERSION N/A 09/19/2016   Procedure: TRANSESOPHAGEAL ECHOCARDIOGRAM (TEE);  Surgeon: [name], MD;  Location: [place];  Service: Open Heart Surgery;  Laterality: N/A;    Social History  reports that he quit smoking about 4 years ago. His smoking use included cigarettes. He started smoking about 62 years ago. He has a 40.00 pack-year smoking history. He has never used smokeless tobacco. He reports that he does not drink alcohol and does not use drugs.  Allergies  Allergen Reactions   Cardizem Cd [Diltiazem Hcl Er Beads] Palpitations    "Makes heart skip"   Cardizem [Diltiazem]     Family History  Problem Relation Age of Onset   Heart attack Mother    Heart attack Brother    Colon cancer Neg Hx    Colon polyps Neg Hx    Prior to Admission medications   Medication Sig Start Date End Date Taking? Authorizing Provider  acetaminophen (TYLENOL) 325 MG tablet Take 2 tablets (650 mg total) by mouth every 6 (six) hours as needed for mild pain. 09/23/16   Nani Skillern, PA-C  allopurinol (ZYLOPRIM) 100 MG tablet Take  100 mg by mouth daily.  05/03/19   [provider]  atorvastatin (LIPITOR) 80 MG tablet Take 80 mg by mouth daily.    [provider]  carvedilol (COREG) 3.125 MG tablet Take 1 tablet (3.125 mg total) by mouth 2 (two) times daily. 05/10/20 05/10/21  Debbe Odea, MD  clopidogrel (PLAVIX) 75 MG tablet Take 75 mg by mouth daily.    [provider]  finasteride (PROSCAR) 5 MG tablet Take 5 mg by mouth daily.    [provider]  folic acid (FOLVITE) 1 MG tablet Take 1 mg by mouth daily.    [provider]  furosemide (LASIX) 80 MG tablet Take 1 tablet (80 mg total) by mouth every Tuesday, Thursday, Saturday, and Sunday. 09/05/20   Orson Eva, MD  isosorbide mononitrate (IMDUR) 120 MG 24 hr tablet Take 1 tablet (120 mg total) by mouth daily. Patient taking differently: Take 240 mg by mouth daily. 05/18/19   Roxan Hockey, MD  levothyroxine (SYNTHROID, LEVOTHROID) 50 MCG tablet Take 50 mcg by mouth daily before breakfast.    [provider]  nitroGLYCERIN (NITROSTAT) 0.4 MG SL tablet Place 0.4 mg under the tongue every 5 (five) minutes as needed for chest pain (max 3 doses).    [provider]  Omega-3 Fatty Acids (RA FISH OIL) 1000 MG CAPS Take 2,000 mg by mouth 2 (two) times daily.    [provider]  omeprazole (PRILOSEC) 20 MG capsule Take 20 mg by mouth 2 (two) times daily. 02/14/20   [provider]  tamsulosin (FLOMAX) 0.4 MG CAPS capsule Take 0.4 mg by mouth daily.    [provider]  Vitamin D, Cholecalciferol, 25 MCG (1000 UT) CAPS Take 1,000 Units by mouth daily.    [provider]    Physical Exam: Vitals:   05/11/21 0307 05/11/21 0721  BP: (!) 191/47 (!) 147/50  Pulse: 97 76  Resp: 18 19  Temp: 97.8 F (36.6 C) 98.4 F (36.9 C)  TempSrc: Oral Oral  SpO2: 99% 95%    Constitutional: In no major distress after receiving antiemetics and analgesics; expressing still some discomfort in  his right upper quadrant. Vitals:   05/11/21 0307 05/11/21 0721  BP: (!) 191/47 (!) 147/50  Pulse: 97 76  Resp: 18 19  Temp: 97.8 F (36.6 C) 98.4 F (36.9 C)  TempSrc: Oral Oral  SpO2: 99% 95%   Eyes: PERRL, lids and conjunctivae normal, no icterus, no nystagmus. ENMT: Mucous membranes are moist. Posterior pharynx clear of any exudate or lesions. Neck: normal, supple, no masses, no thyromegaly, no JVD on exam. Respiratory: clear to auscultation bilaterally, no wheezing, no crackles. Normal respiratory effort. No accessory muscle use.  Good saturation on room air. Cardiovascular: Regular rate and rhythm, no rubs, no gallops, trace edema bilaterally appreciated.  Patient denies chest pain. Abdomen: Mild discomfort with deep palpation in his right upper quadrant area; no guarding, soft, positive bowel sounds.   Musculoskeletal: No cyanosis or clubbing.  Good range of motion bilaterally; no significant joint deformity appreciated. Skin: no petechiae.  Right upper extremity aVF with good bruit at. Neurologic: CN 2-12 grossly intact. Sensation intact, DTR normal. Strength 5/5 in all 4.  Psychiatric: Normal judgment and insight. Alert and oriented x 3. Normal mood.   Labs on Admission: I have personally reviewed following labs and imaging studies  CBC: Recent Labs  Lab 05/11/21 0315  WBC 7.7  NEUTROABS 6.3  HGB 9.4*  HCT 29.3*  MCV 98.7  PLT 132*    Basic Metabolic Panel: Recent Labs  Lab 05/11/21 0315  NA 136  K 4.4  CL 97*  CO2 29  GLUCOSE 108*  BUN 35*  CREATININE 4.38*  CALCIUM 8.8*    GFR: CrCl cannot be calculated (Unknown ideal weight.).  Liver Function Tests: Recent Labs  Lab 05/11/21 0315  AST 20  ALT 12  ALKPHOS 109  BILITOT 0.5  PROT 7.2  ALBUMIN 3.6    Urine analysis:    Component Value Date/Time   COLORURINE YELLOW 02/09/2020 0958   APPEARANCEUR CLEAR 02/09/2020 0958   LABSPEC 1.012 02/09/2020 0958   PHURINE 5.0 02/09/2020 0958    GLUCOSEU NEGATIVE 02/09/2020 0958   HGBUR NEGATIVE 02/09/2020 0958   BILIRUBINUR NEGATIVE 02/09/2020 0958   KETONESUR NEGATIVE 02/09/2020 0958   PROTEINUR 100 (A) 02/09/2020 0958   NITRITE NEGATIVE 02/09/2020 0958   LEUKOCYTESUR NEGATIVE 02/09/2020 0958    Radiological Exams on Admission: No results found.  EKG: Independently reviewed.  No acute ischemic changes; sinus rhythm.  Assessment/Plan 1-abdominal pain, nausea/vomiting: In the setting of cholelithiasis and very  likely biliary colic. -General surgery has been consulted -Patient's second admission with similar symptomatology. -High risk for surgical intervention, explained to patient but he expressed that he is willing to take the risk as he is no having quality of life and suffering a lot with this ongoing process. -Will follow general surgery recommendations. -Normal WBCs and no fever; holding antibiotics at this point. -???  HIDA scan and potentially cholecystostomy option.  2-ESRD: M, W AND F -Last hemodialysis 05/10/2021 -Nephrology service has been consulted for continuation of dialysis -Appears stable overall.  3-hypertension -Resume home antihypertensive agents.  4-anemia of chronic kidney disease -IV iron and Epogen therapy as per nephrology service recommendation -No overt bleeding appreciated.  5-history of coronary disease -Currently denying chest pain or shortness of breath -Flat elevation of his troponin. -Telemetry monitoring has been ordered -Continue the use of carvedilol, statins, Imdur and Plavix. -Will continue as needed nitroglycerin.  6-hypothyroidism -Continue Synthroid.  7-hyperlipidemia -Continue statins.  8-history of BPH -Continue the use of Flomax and Proscar.  9-gastroesophageal reflux disease -Continue PPI.  DVT prophylaxis: Heparin Code Status:   Full code. Family Communication:  No family at bedside. Disposition Plan:   Patient is from:  Home  Anticipated DC  to:  Home  Anticipated DC date:  To be determined  Anticipated DC barriers: Stabilization of abd pain/nausea/vomiting and definitive treatment for gallbladder problems.  Consults called:  Nephrology and general surgery. Admission status:  Inpatient, telemetry bed, length of stay more than 2 midnights.  Severity of Illness: The appropriate patient status for this patient is INPATIENT. Inpatient status is judged to be reasonable and necessary in order to provide the required intensity of service to ensure the patient's safety. The patient's presenting symptoms, physical exam findings, and initial radiographic and laboratory data in the context of their chronic comorbidities is felt to place them at high risk for further clinical deterioration. Furthermore, it is not anticipated that the patient will be medically stable for discharge from the hospital within 2 midnights of admission.   * I certify that at the point of admission it is my clinical judgment that the patient will require inpatient hospital care spanning beyond 2 midnights from the point of admission due to high intensity of service, high risk for further deterioration and high frequency of surveillance required.Barton Dubois MD [facility]  How to contact the [facility] Attending or Consulting provider [place] or covering provider during after hours [place], for this patient?   Check the care team in [facility] and look for a) attending/consulting TR[facility] provider listed and b) the [facility] team listed Log into [email or web address] and use [place]'s universal password to access. If you do not have the password, please contact the hospital operator. Locate the [facility] provider you are looking for under [facility] and page to a number that you can be directly reached. If you still have difficulty reaching the provider, please page the [facility] (Director on Call) for the Hospitalists listed on amion for assistance.  05/11/2021, 8:19 AM

## 2021-05-11 NOTE — ED Provider Notes (Signed)
[name] EMERGENCY DE[facility]MENT Provider Note   CSN: [id number] Arrival date & time: 05/11/21  6808     History Chief Complaint  Patient presents with   Abdominal Pain    gallbladder    [name] is a 75 y.o. male.  Patient presents to the emergency department for evaluation of abdominal pain with nausea and vomiting.  Patient reports that he was awakened from sleep by symptoms.  Pain is in the upper abdomen and constant.  Patient reports that he was recently hospitalized for his gallbladder and the symptoms feel similar.  No chest pain or shortness of breath.      Past Medical History:  Diagnosis Date   Acute myocardial infarction of other inferior wall, initial episode of care    Anemia    Anginal pain ([place])    Anxiety    Arthritis    Cancer ([place])    bladder 2013 removed, has cystoscopy 10/2016, has returned x 2   COPD (chronic obstructive pulmonary disease) ([place])    Coronary artery disease    Artery bypass graft Jan 2008   Dialysis patient [facility])    Dyspnea    Dysrhythmia    GERD (gastroesophageal reflux disease)    Heart murmur    History of hiatal hernia    Hyperlipidemia    Hypertension    Hypothyroidism    PONV (postoperative nausea and vomiting)    Renal disorder    Sleep apnea    Stroke [facility])    Tobacco user     Patient Active Problem List   Diagnosis Date Noted   Abdominal pain 04/05/2021   Cholelithiasis 04/05/2021   Hypertensive urgency 04/05/2021   Adenoma of right adrenal gland 04/05/2021   Elevated lipase 04/05/2021   Hypothyroidism (acquired) 04/05/2021   GERD (gastroesophageal reflux disease) 04/05/2021   History of COPD 04/05/2021   Malignant neoplasm of overlapping sites of bladder ([place]) 09/07/2020   End-stage renal disease on hemodialysis ([place]) 09/02/2020   Rectal bleeding    Acute diastolic CHF (congestive heart failure) ([place]) 05/09/2020   Acute exacerbation of Diastolic CHF (congestive heart failure) ([place]) 05/08/2020    Symptomatic bradycardia with heart failure 05/08/2020   COPD without exacerbation (HCC)---40 Pack Years 05/08/2020   Acute respiratory failure with hypoxia ([place]) 05/08/2020   Uremia in the setting of AKI on CKD V 02/07/2020   CKD (chronic kidney disease), stage V ([place]) 02/07/2020   Coronary artery disease involving native coronary artery of native heart without angina pectoris 12/16/2019   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   Gastroenteritis 12/16/2019   History of bladder cancer 12/16/2019   Hyperkalemia 12/16/2019   Renal lesion 05/16/2019   Proteinuria 03/28/2019   Acute blood loss anemia 06/08/2018   Anemia in chronic kidney disease 06/08/2018   BPH (benign prostatic hyperplasia) 06/08/2018   Coronary artery disease with unstable angina pectoris ([place]) 06/08/2018   Elevated troponin 06/08/2018   GIB (gastrointestinal bleeding) 06/07/2018   (HFpEF) heart failure with preserved ejection fraction ([place]) 06/30/2017   Coronary artery disease involving autologous artery coronary bypass graft 06/29/2017   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/19/2016   NSTEMI (non-ST elevated myocardial infarction) ([place]) 09/14/2016   Chest pain 09/14/2016   Obstructive sleep apnea 09/14/2016   Acute myocardial infarction of other inferior wall, initial episode of care    Unstable angina ([place]) 01/30/2014   OVERWEIGHT/OBESITY 02/02/2010   HYPERLIPIDEMIA, MIXED 01/22/2009   OTHER CHEST PAIN 01/22/2009   TOBACCO USER 01/17/2009   Essential hypertension  01/17/2009    Past Surgical History:  Procedure Laterality Date   APPENDECTOMY     AV FISTULA PLACEMENT Right 02/11/2020   Procedure: RIGHT ARM BRACHIOCEPHALIC ARTERIOVENOUS (AV) FISTULA;  Surgeon: Angelia Mould, MD;  Location[facility] OR;  Service: Vascular;  Laterality: Right;   CARDIAC CATHETERIZATION     COLONOSCOPY WITH PROPOFOL N/A 09/02/2020   Procedure: COLONOSCOPY WITH PROPOFOL;  Surgeon: Rogene Houston, MD;  [facility];  Service:  Endoscopy;  Laterality: N/A;   CORONARY ARTERY BYPASS GRAFT  06/14/2006   x 5   CORONARY ARTERY BYPASS GRAFT N/A 09/19/2016   Procedure: REDO CORONARY ARTERY BYPASS GRAFTING (CABG) x two, using right internal mammary artery and left radial artery;  Surgeon: [name], MD;  Location: [place];  Service: Open Heart Surgery;  Laterality: N/A;   FOOT SURGERY     HEMOSTASIS CLIP PLACEMENT  09/02/2020   Procedure: HEMOSTASIS CLIP PLACEMENT;  Surgeon: Rogene Houston, MD;  [facility];  Service: Endoscopy;;   HERNIA REPAIR     HOT HEMOSTASIS  09/02/2020   Procedure: HOT HEMOSTASIS (ARGON PLASMA COAGULATION/BICAP);  Surgeon: Rogene Houston, MD;  [facility];  Service: Endoscopy;;   LEFT HEART CATH AND CORS/GRAFTS ANGIOGRAPHY N/A 09/14/2016   Procedure: Left Heart Cath and Cors/Grafts Angiography;  Surgeon: Troy Sine, MD;  Location: [place] CV LAB;  Service: Cardiovascular;  Laterality: N/A;   LEFT HEART CATHETERIZATION WITH CORONARY/GRAFT ANGIOGRAM N/A 01/31/2014   Procedure: LEFT HEART CATHETERIZATION WITH Beatrix Fetters;  Surgeon: Sanda Klein, MD;  Location: [place] CATH LAB;  Service: Cardiovascular;  Laterality: N/A;   NOSE SURGERY     PERCUTANEOUS CORONARY STENT INTERVENTION (PCI-S)  01/31/2014   Procedure: PERCUTANEOUS CORONARY STENT INTERVENTION (PCI-S);  Surgeon: Sanda Klein, MD;  Location: [facility] CATH LAB;  Service: Cardiovascular;;   POLYPECTOMY  09/02/2020   Procedure: POLYPECTOMY;  Surgeon: Rogene Houston, MD;  [facility];  Service: Endoscopy;;   RADIAL ARTERY HARVEST Left 09/19/2016   Procedure: RADIAL ARTERY HARVEST;  Surgeon: [name], MD;  Location: [place];  Service: Open Heart Surgery;  Laterality: Left;   TEE WITHOUT CARDIOVERSION N/A 09/19/2016   Procedure: TRANSESOPHAGEAL ECHOCARDIOGRAM (TEE);  Surgeon: [name], MD;  Location: [place];  Service: Open Heart Surgery;  Laterality: N/A;       Family History  Problem  Relation Age of Onset   Heart attack Mother    Heart attack Brother    Colon cancer Neg Hx    Colon polyps Neg Hx     Social History   Tobacco Use   Smoking status: Former    Packs/day: 1.00    Years: 40.00    Pack years: 40.00    Types: Cigarettes    Start date: 02/20/1959    Quit date: 09/13/2016    Years since quitting: 4.6   Smokeless tobacco: Never  Vaping Use   Vaping Use: Never used  Substance Use Topics   Alcohol use: No    Alcohol/week: 0.0 standard drinks    Comment: "No, not really"   Drug use: No    Home Medications Prior to Admission medications   Medication Sig Start Date End Date Taking? Authorizing Provider  acetaminophen (TYLENOL) 325 MG tablet Take 2 tablets (650 mg total) by mouth every 6 (six) hours as needed for mild pain. 09/23/16   Nani Skillern, PA-C  allopurinol (ZYLOPRIM) 100 MG tablet Take 100 mg by  mouth daily.  05/03/19   [provider]  atorvastatin (LIPITOR) 80 MG tablet Take 80 mg by mouth daily.    [provider]  carvedilol (COREG) 3.125 MG tablet Take 1 tablet (3.125 mg total) by mouth 2 (two) times daily. 05/10/20 05/10/21  Debbe Odea, MD  clopidogrel (PLAVIX) 75 MG tablet Take 75 mg by mouth daily.    [provider]  finasteride (PROSCAR) 5 MG tablet Take 5 mg by mouth daily.    [provider]  folic acid (FOLVITE) 1 MG tablet Take 1 mg by mouth daily.    [provider]  furosemide (LASIX) 80 MG tablet Take 1 tablet (80 mg total) by mouth every Tuesday, Thursday, Saturday, and Sunday. 09/05/20   Orson Eva, MD  isosorbide mononitrate (IMDUR) 120 MG 24 hr tablet Take 1 tablet (120 mg total) by mouth daily. Patient taking differently: Take 240 mg by mouth daily. 05/18/19   Roxan Hockey, MD  levothyroxine (SYNTHROID, LEVOTHROID) 50 MCG tablet Take 50 mcg by mouth daily before breakfast.    [provider]  nitroGLYCERIN (NITROSTAT) 0.4 MG SL tablet Place 0.4 mg under the  tongue every 5 (five) minutes as needed for chest pain (max 3 doses).    [provider]  Omega-3 Fatty Acids (RA FISH OIL) 1000 MG CAPS Take 2,000 mg by mouth 2 (two) times daily.    [provider]  omeprazole (PRILOSEC) 20 MG capsule Take 20 mg by mouth 2 (two) times daily. 02/14/20   [provider]  tamsulosin (FLOMAX) 0.4 MG CAPS capsule Take 0.4 mg by mouth daily.    [provider]  Vitamin D, Cholecalciferol, 25 MCG (1000 UT) CAPS Take 1,000 Units by mouth daily.    [provider]    Allergies    Cardizem cd [diltiazem hcl er beads] and Cardizem [diltiazem]  Review of Systems   Review of Systems  Gastrointestinal:  Positive for abdominal pain, nausea and vomiting.  All other systems reviewed and are negative.  Physical Exam Updated Vital Signs BP (!) 191/47    Pulse 97    Temp 97.8 F (36.6 C) (Oral)    Resp 18    SpO2 99%   Physical Exam Vitals and nursing note reviewed.  Constitutional:      General: He is not in acute distress.    Appearance: Normal appearance. He is well-developed.  HENT:     Head: Normocephalic and atraumatic.     Right Ear: Hearing normal.     Left Ear: Hearing normal.     Nose: Nose normal.  Eyes:     Conjunctiva/sclera: Conjunctivae normal.     Pupils: Pupils are equal, round, and reactive to light.  Cardiovascular:     Rate and Rhythm: Regular rhythm.     Heart sounds: S1 normal and S2 normal. No murmur heard.   No friction rub. No gallop.  Pulmonary:     Effort: Pulmonary effort is normal. No respiratory distress.     Breath sounds: Normal breath sounds.  Chest:     Chest wall: No tenderness.  Abdominal:     General: Bowel sounds are normal.     Palpations: Abdomen is soft.     Tenderness: There is abdominal tenderness in the right upper quadrant. There is no guarding or rebound. Negative signs include Murphy's sign and McBurney's sign.     Hernia: No hernia is present.  Musculoskeletal:         General: Normal  range of motion.     Cervical back: Normal range of motion and neck supple.  Skin:    General: Skin is warm and dry.     Findings: No rash.  Neurological:     Mental Status: He is alert and oriented to person, place, and time.     GCS: GCS eye subscore is 4. GCS verbal subscore is 5. GCS motor subscore is 6.     Cranial Nerves: No cranial nerve deficit.     Sensory: No sensory deficit.     Coordination: Coordination normal.  Psychiatric:        Speech: Speech normal.        Behavior: Behavior normal.        Thought Content: Thought content normal.    ED Results / Procedures / Treatments   Labs (all labs ordered are listed, but only abnormal results are displayed) Labs Reviewed  CBC WITH DIFFERENTIAL/PLATELET - Abnormal; Notable for the following components:      Result Value   RBC 2.97 (*)    Hemoglobin 9.4 (*)    HCT 29.3 (*)    Platelets 132 (*)    All other components within normal limits  COMPREHENSIVE METABOLIC PANEL - Abnormal; Notable for the following components:   Chloride 97 (*)    Glucose, Bld 108 (*)    BUN 35 (*)    Creatinine, Ser 4.38 (*)    Calcium 8.8 (*)    GFR, Estimated 13 (*)    All other components within normal limits  LIPASE, BLOOD - Abnormal; Notable for the following components:   Lipase 54 (*)    All other components within normal limits  TROPONIN I (HIGH SENSITIVITY) - Abnormal; Notable for the following components:   Troponin I (High Sensitivity) 39 (*)    All other components within normal limits  TROPONIN I (HIGH SENSITIVITY) - Abnormal; Notable for the following components:   Troponin I (High Sensitivity) 35 (*)    All other components within normal limits  RESP PANEL BY RT-PCR (FLU A&B, COVID) [id number]    EKG EKG Interpretation  Date/Time:  Tuesday May 11 2021 03:34:28 EST Ventricular Rate:  71 PR Interval:  138 QRS Duration: 87 QT Interval:  418 QTC Calculation: 455 R Axis:   19 Text  Interpretation: Sinus rhythm Repol abnrm suggests ischemia, lateral leads Confirmed by Orpah Greek [id number]) on 05/11/2021 4:28:15 AM  Radiology No results found.  Procedures Procedures   Medications Ordered in ED Medications  sodium chloride 0.9 % bolus 500 mL (0 mLs Intravenous Stopped 05/11/21 0449)  ondansetron (ZOFRAN) injection 4 mg (4 mg Intravenous Given 05/11/21 0326)  HYDROmorphone (DILAUDID) injection 1 mg (1 mg Intravenous Given 05/11/21 0327)  alum & mag hydroxide-simeth (MAALOX/MYLANTA) 200-200-20 MG/5ML suspension 30 mL (30 mLs Oral Given 05/11/21 0508)    And  lidocaine (XYLOCAINE) 2 % viscous mouth solution 15 mL (15 mLs Oral Given 05/11/21 0508)  famotidine (PEPCID) tablet 40 mg (40 mg Oral Given 05/11/21 8916)    ED Course  I have reviewed the triage vital signs and the nursing notes.  Pertinent labs & imaging results that were available during my care of the patient were reviewed by me and considered in my medical decision making (see chart for details).    MDM Rules/Calculators/A&P                         Patient presents with acute onset of right upper  quadrant abdominal pain that awakened him from sleep.  Patient does have known cholelithiasis.  He when he was hospitalized recently work-up was equivocal for cholecystitis.  He was followed medically because he is a very poor operative candidate and was experiencing an MI at that time.  He appears to have done well from a gallbladder standpoint but now has recurrent symptoms.  Patient with tenderness but no clear peritoneal signs in the right upper quadrant.  LFTs unremarkable.  Lipase slightly elevated, similar to prior.  Common bile duct was normal  his most recent imaging.  Patient with slight elevation of troponin.  This is actually reassuring as he was over 3000 with his last hospitalization.  Abdominal pain significantly improved with Dilaudid but he is still having nausea and vomiting.  He is now  complaining of "indigestion".  Based on his cardiac history, will require monitoring in-house for his chest pain.  Discussed with Dr. Arnoldo Morale, on-call for general surgery.  He did not request any additional imaging (ultrasound, HIDA) at this time, will see the patient this morning and determine if imaging is required.    Final Clinical Impression(s) / ED Diagnoses Final diagnoses:  Right upper quadrant abdominal pain  Chest pain, unspecified type    Rx / DC Orders ED Discharge Orders     None        Orpah Greek, MD 05/11/21 [phone]

## 2021-05-11 NOTE — ED Notes (Signed)
Called about bed assignment, floor unable to take patient due to call out.  Will reassess when nurse coming in arrives.

## 2021-05-11 NOTE — ED Triage Notes (Addendum)
Here from home with cc of abdominal pain from his gallbladder since midnight. Was seen a month ago and it was giving him problems then but they weren't ready to take it out.   Right side restriction. MTW HD

## 2021-05-12 DIAGNOSIS — R1013 Epigastric pain: Secondary | ICD-10-CM

## 2021-05-12 DIAGNOSIS — R1011 Right upper quadrant pain: Secondary | ICD-10-CM | POA: Diagnosis not present

## 2021-05-12 LAB — CBC
HCT: 26.5 % — ABNORMAL LOW (ref 39.0–52.0)
Hemoglobin: 8.7 g/dL — ABNORMAL LOW (ref 13.0–17.0)
MCH: 31.6 pg (ref 26.0–34.0)
MCHC: 32.8 g/dL (ref 30.0–36.0)
MCV: 96.4 fL (ref 80.0–100.0)
Platelets: 99 10*3/uL — ABNORMAL LOW (ref 150–400)
RBC: 2.75 MIL/uL — ABNORMAL LOW (ref 4.22–5.81)
RDW: 15.5 % (ref 11.5–15.5)
WBC: 7.4 10*3/uL (ref 4.0–10.5)
nRBC: 0 % (ref 0.0–0.2)

## 2021-05-12 LAB — COMPREHENSIVE METABOLIC PANEL
ALT: 11 U/L (ref 0–44)
AST: 29 U/L (ref 15–41)
Albumin: 3.3 g/dL — ABNORMAL LOW (ref 3.5–5.0)
Alkaline Phosphatase: 97 U/L (ref 38–126)
Anion gap: 10 (ref 5–15)
BUN: 49 mg/dL — ABNORMAL HIGH (ref 8–23)
CO2: 25 mmol/L (ref 22–32)
Calcium: 8.8 mg/dL — ABNORMAL LOW (ref 8.9–10.3)
Chloride: 98 mmol/L (ref 98–111)
Creatinine, Ser: 5.73 mg/dL — ABNORMAL HIGH (ref 0.61–1.24)
GFR, Estimated: 10 mL/min — ABNORMAL LOW (ref >60)
Glucose, Bld: 147 mg/dL — ABNORMAL HIGH (ref 70–99)
Potassium: 4.5 mmol/L (ref 3.5–5.1)
Sodium: 133 mmol/L — ABNORMAL LOW (ref 135–145)
Total Bilirubin: 0.5 mg/dL (ref 0.3–1.2)
Total Protein: 6.6 g/dL (ref 6.5–8.1)

## 2021-05-12 LAB — HEPATITIS B SURFACE ANTIBODY,QUALITATIVE: Hep B S Ab: NONREACTIVE

## 2021-05-12 LAB — HEPATITIS B SURFACE ANTIBODY, QUANTITATIVE: Hep B S AB Quant (Post): <3.1 m[IU]/mL — ABNORMAL LOW (ref >9.9)

## 2021-05-12 LAB — HEPATITIS B SURFACE ANTIGEN
Hepatitis B Surface Ag: NONREACTIVE
Hepatitis B Surface Ag: NONREACTIVE

## 2021-05-12 NOTE — Procedures (Signed)
° °  HEMODIALYSIS TREATMENT NOTE:   Uneventful HD session completed using right upper arm AVF (15g/antegrade). Goal met: 1.1 liters removed.  All blood was returned and hemostasis was achieved in 15 minutes. Pt is at his EDW of 82.5kg.     Rockwell Alexandria, RN

## 2021-05-12 NOTE — Clinical Social Work Note (Signed)
[facility] notified of pts hospital admission. [facility] notification ID is [id number].

## 2021-05-12 NOTE — Progress Notes (Signed)
Subjective: Patient denies any abdominal pain.  Objective: Vital signs in last 24 hours: Temp:  [98 F (36.7 C)-98.6 F (37 C)] 98.4 F (36.9 C) (12/28 0518) Pulse Rate:  [65-84] 76 (12/28 0521) Resp:  [18-20] 18 (12/28 0518) BP: (131-150)/(40-61) 131/40 (12/28 0518) SpO2:  [92 %-96 %] 96 % (12/28 0521) Weight:  [83.4 kg] 83.4 kg (12/27 1120) Last BM Date: 05/11/21  Intake/Output from previous day: 12/27 0701 - 12/28 0700 In: 960 [P.O.:960] Out: -  Intake/Output this shift: No intake/output data recorded.  General appearance: alert, cooperative, and no distress GI: soft, non-tender; bowel sounds normal; no masses,  no organomegaly  Lab Results:  Recent Labs    05/11/21 0315  WBC 7.7  HGB 9.4*  HCT 29.3*  PLT 132*   BMET Recent Labs    05/11/21 0315  NA 134*   136  K 4.4   4.4  CL 96*   97*  CO2 29   29  GLUCOSE 102*   108*  BUN 37*   35*  CREATININE 4.43*   4.38*  CALCIUM 8.8*   8.8*   PT/INR No results for input(s): LABPROT, INR in the last 72 hours.  Studies/Results: NM Hepatobiliary Liver Func  Result Date: 05/11/2021 CLINICAL DATA:  Biliary colic, right upper quadrant abdominal pain EXAM: NUCLEAR MEDICINE HEPATOBILIARY IMAGING TECHNIQUE: Sequential images of the abdomen were obtained out to 60 minutes following intravenous administration of radiopharmaceutical. RADIOPHARMACEUTICALS:  5.5 mCi Tc-62m  Choletec IV COMPARISON:  Ultrasound and CT examinations from 04/06/2021 and 04/05/2021, respectively FINDINGS: The patient had discomfort that he stated was due to reflux, and had a difficult time cooperating with the examination. We were able to obtain standard images through 15 minutes, after that he refused imaging until the 60 minute images. We obtained a lateral 85 minute image to help try to clarify whether the gallbladder was filling or not, but the patient rejected any further imaging and accordingly morphine administration could not be employed. The  initial 15 minute images demonstrate satisfactory uptake of radiopharmaceutical from the blood pool. Biliary activity is visible at 11 minutes. The gallbladder was not visible at 15 minutes. The gallbladder was also not definitively seen at 60 minutes, although there was substantial bowel activity at this time. A collection anterior to the biliary tree on the lateral projection from 60 minutes was questionable for gallbladder activity versus gastric activity, but appeared to flush out when we administered the patient has some water to drink and reperformed imaging at 85 minutes. Accordingly the gallbladder did not fill. IMPRESSION: 1. The CBD is patent and there is satisfactory uptake of radiopharmaceutical from the blood pool. 2. Cystic duct patency is not a stab list. We did not fill the gallbladder out through 85 minutes, but the patient terminated the exam prior to morphine administration and further imaging. Electronically Signed   By: Van Clines M.D.   On: 05/11/2021 14:41    Anti-infectives: Anti-infectives (From admission, onward)    None       Assessment/Plan: Impression: Biliary colic secondary to cholelithiasis, resolved.  HIDA scan equivocal bold as patient did not tolerate lying on his back. Plan: It was noted that he did go into bigeminy yesterday evening.  Given his multiple comorbidities, an outpatient elective cholecystectomy may be warranted.  This should be done at a tertiary care center.  He is in the [place] system.  He should avoid those trigger foods.  Would treat for GERD.  Okay for discharge from  surgery standpoint after dialysis today.  LOS: 1 day    Aviva Signs 05/12/2021

## 2021-05-12 NOTE — Progress Notes (Signed)
[facility] NEPHROLOGY PROGRESS NOTE  Assessment/ Plan: Pt is a 75 y.o. yo male  with history of HTN, CAD, CHF, BPH, ESRD on HD MWF at [facility], cholelithiasis who presents to the hospital with nausea and abdominal pain, seen as a consultation for the management of ESRD.  Dialysis Orders from previous visit: Center: [facility]  on MWF . EDW 83 kg HD Bath 2K/2.5Ca  Time 4 hours Heparin none. Access  RUE AVF BFR 400 DFR 500   #Cholelithiasis with nausea and abdominal pain: This is his second admission.  Seen by general surgeon and HIDA scan was done.  No plan for surgery at this time.  Patient is tolerating orally and clinically asymptomatic today.    # ESRD: MWF at [facility]: Plan for regular dialysis today.  Right upper extremity AV fistula for the access.   # Hypertension: Blood pressure and volume status acceptable.  Continue current cardiac medication.  UF as tolerated.    # Anemia of ESRD: Hemoglobin below goal. Resume ESA with HD.   # Metabolic Bone Disease: Noted low phosphorus, not on binders.  Resuming diet today.  Monitor calcium phosphorus level.  Discussed with primary team and general surgeon.  Subjective: Seen and examined bedside.  He is sitting on chair comfortable.  Denies nausea, vomiting, chest pain, shortness of breath.  Eager to go home today. Objective Vital signs in last 24 hours: Vitals:   05/11/21 2157 05/11/21 2158 05/12/21 0518 05/12/21 0521  BP: (!) 150/61 (!) 150/61 (!) 131/40   Pulse: 84 84 76 76  Resp:  20 18   Temp:  98.6 F (37 C) 98.4 F (36.9 C)   TempSrc:  Oral Oral   SpO2:  95% 96% 96%  Weight:      Height:       Weight change:   Intake/Output Summary (Last 24 hours) at 05/12/2021 0923 Last data filed at 05/12/2021 0600 Gross per 24 hour  Intake 960 ml  Output --  Net 960 ml       Labs: Basic Metabolic Panel: Recent Labs  Lab 05/11/21 0315  NA 134*   136  K 4.4   4.4  CL 96*   97*  CO2 29   29  GLUCOSE  102*   108*  BUN 37*   35*  CREATININE 4.43*   4.38*  CALCIUM 8.8*   8.8*  PHOS 2.4*   Liver Function Tests: Recent Labs  Lab 05/11/21 0315  AST 20  ALT 12  ALKPHOS 109  BILITOT 0.5  PROT 7.2  ALBUMIN 3.7   3.6   Recent Labs  Lab 05/11/21 0315  LIPASE 54*   No results for input(s): AMMONIA in the last 168 hours. CBC: Recent Labs  Lab 05/11/21 0315  WBC 7.7  NEUTROABS 6.3  HGB 9.4*  HCT 29.3*  MCV 98.7  PLT 132*   Cardiac Enzymes: No results for input(s): CKTOTAL, CKMB, CKMBINDEX, TROPONINI in the last 168 hours. CBG: No results for input(s): GLUCAP in the last 168 hours.  Iron Studies: No results for input(s): IRON, TIBC, TRANSFERRIN, FERRITIN in the last 72 hours. Studies/Results: NM Hepatobiliary Liver Func  Result Date: 05/11/2021 CLINICAL DATA:  Biliary colic, right upper quadrant abdominal pain EXAM: NUCLEAR MEDICINE HEPATOBILIARY IMAGING TECHNIQUE: Sequential images of the abdomen were obtained out to 60 minutes following intravenous administration of radiopharmaceutical. RADIOPHARMACEUTICALS:  5.5 mCi Tc-50m  Choletec IV COMPARISON:  Ultrasound and CT examinations from 04/06/2021 and 04/05/2021, respectively FINDINGS:  The patient had discomfort that he stated was due to reflux, and had a difficult time cooperating with the examination. We were able to obtain standard images through 15 minutes, after that he refused imaging until the 60 minute images. We obtained a lateral 85 minute image to help try to clarify whether the gallbladder was filling or not, but the patient rejected any further imaging and accordingly morphine administration could not be employed. The initial 15 minute images demonstrate satisfactory uptake of radiopharmaceutical from the blood pool. Biliary activity is visible at 11 minutes. The gallbladder was not visible at 15 minutes. The gallbladder was also not definitively seen at 60 minutes, although there was substantial bowel activity at this  time. A collection anterior to the biliary tree on the lateral projection from 60 minutes was questionable for gallbladder activity versus gastric activity, but appeared to flush out when we administered the patient has some water to drink and reperformed imaging at 85 minutes. Accordingly the gallbladder did not fill. IMPRESSION: 1. The CBD is patent and there is satisfactory uptake of radiopharmaceutical from the blood pool. 2. Cystic duct patency is not a stab list. We did not fill the gallbladder out through 85 minutes, but the patient terminated the exam prior to morphine administration and further imaging. Electronically Signed   By: Van Clines M.D.   On: 05/11/2021 14:41    Medications: Infusions:  sodium chloride     sodium chloride      Scheduled Medications:  atorvastatin  80 mg Oral Daily   carvedilol  3.125 mg Oral BID   Chlorhexidine Gluconate Cloth  6 each Topical Q0600   clopidogrel  75 mg Oral Daily   finasteride  5 mg Oral Daily   folic acid  1 mg Oral Daily   heparin  5,000 Units Subcutaneous Q8H   isosorbide mononitrate  120 mg Oral Daily   levothyroxine  50 mcg Oral QAC breakfast   metoCLOPramide (REGLAN) injection  10 mg Intravenous Once   omega-3 acid ethyl esters  2,000 mg Oral BID   pantoprazole  40 mg Oral Daily   tamsulosin  0.4 mg Oral Daily    have reviewed scheduled and prn medications.  Physical Exam: General:NAD, comfortable Heart:RRR, s1s2 nl Lungs:clear b/l, no crackle Abdomen:soft, Non-tender, non-distended Extremities:No edema Dialysis Access: Right upper extremity AV fistula has good thrill and bruit.  Laurali Goddard Prasad Wallis Vancott 05/12/2021,9:23 AM  LOS: 1 day

## 2021-05-12 NOTE — Discharge Summary (Signed)
Physician Discharge Summary  [name] OEV:[id number] DOB: 11/10/1945 DOA: 05/11/2021  PCP: [facility], [facility]  Admit date: 05/11/2021  Discharge date: 05/12/2021  Admitted From:Home  Disposition:  Home  Recommendations for Outpatient Follow-up:  Follow up with PCP in 1-2 weeks Continue hemodialysis schedule as prior M, W, F Follow-up with GI at [facility] as recommended Continue home medications as prior including PPI  Home Health: None  Equipment/Devices: None  Discharge Condition:Stable  CODE STATUS: Full  Diet recommendation: Heart Healthy  Brief/Interim Summary: [name] is a 75 y.o. male with medical history significant of end-stage renal disease on hemodialysis (Monday, Wednesday), history of bladder cancer diagnosed in 2013 actively followed by urologist at [facility] (last visit suggesting remission), history of BPH, hypertension, hyperlipidemia, hypothyroidism, gastroesophageal reflux disease, history of coronary artery disease and chronic diastolic heart failure; who presented to the emergency department secondary to abdominal pain, nausea and vomiting.  His pain was mostly localized to the right upper quadrant.  He was admitted for likely biliary colic and was assessed by general surgery with HIDA scan.  His symptoms had resolved and his diet was advanced.  He was otherwise tolerating diet and had no need for inpatient operative intervention.  Given his significant comorbidities, he has been recommended to follow-up with the [facility] for further evaluation recommendations.  He has received hemodialysis during this inpatient stay per his usual schedule and will resume again in the outpatient setting.  No other acute events noted throughout the course of this admission.  Discharge Diagnoses:  Principal Problem:   Abdominal pain  Principal discharge diagnosis: Biliary colic in the setting of cholelithiasis.  Discharge Instructions  Discharge Instructions     Diet -  low sodium heart healthy   Complete by: As directed    Increase activity slowly   Complete by: As directed    No wound care   Complete by: As directed       Allergies as of 05/12/2021       Reactions   Cardizem Cd [diltiazem Hcl Er Beads] Palpitations   "Makes heart skip"   Cardizem [diltiazem]         Medication List     TAKE these medications    acetaminophen 325 MG tablet Commonly known as: TYLENOL Take 2 tablets (650 mg total) by mouth every 6 (six) hours as needed for mild pain.   allopurinol 100 MG tablet Commonly known as: ZYLOPRIM Take 100 mg by mouth daily.   atorvastatin 80 MG tablet Commonly known as: LIPITOR Take 80 mg by mouth daily.   carvedilol 3.125 MG tablet Commonly known as: Coreg Take 1 tablet (3.125 mg total) by mouth 2 (two) times daily.   clopidogrel 75 MG tablet Commonly known as: PLAVIX Take 75 mg by mouth daily.   finasteride 5 MG tablet Commonly known as: PROSCAR Take 5 mg by mouth daily.   folic acid 1 MG tablet Commonly known as: FOLVITE Take 1 mg by mouth daily.   furosemide 80 MG tablet Commonly known as: LASIX Take 1 tablet (80 mg total) by mouth every Tuesday, Thursday, Saturday, and Sunday.   isosorbide mononitrate 120 MG 24 hr tablet Commonly known as: IMDUR Take 1 tablet (120 mg total) by mouth daily. What changed: how much to take   levothyroxine 50 MCG tablet Commonly known as: SYNTHROID Take 50 mcg by mouth daily before breakfast.   nitroGLYCERIN 0.4 MG SL tablet Commonly known as: NITROSTAT Place 0.4 mg under the tongue  every 5 (five) minutes as needed for chest pain (max 3 doses).   omeprazole 20 MG capsule Commonly known as: PRILOSEC Take 20 mg by mouth 2 (two) times daily.   RA Fish Oil 1000 MG Caps Take 2,000 mg by mouth 2 (two) times daily.   tamsulosin 0.4 MG Caps capsule Commonly known as: FLOMAX Take 0.4 mg by mouth daily.   Vitamin D (Cholecalciferol) 25 MCG (1000 UT) Caps Take 1,000  Units by mouth daily.        Follow-up Information     Clinic, [place]. Schedule an appointment as soon as possible for a visit in 1 week(s).   Contact information: [place] [address] [phone]                Allergies  Allergen Reactions   Cardizem Cd [Diltiazem Hcl Er Beads] Palpitations    "Makes heart skip"   Cardizem [Diltiazem]     Consultations: General surgery Nephrology   Procedures/Studies: NM Hepatobiliary Liver Func  Result Date: 05/11/2021 CLINICAL DATA:  Biliary colic, right upper quadrant abdominal pain EXAM: NUCLEAR MEDICINE HEPATOBILIARY IMAGING TECHNIQUE: Sequential images of the abdomen were obtained out to 60 minutes following intravenous administration of radiopharmaceutical. RADIOPHARMACEUTICALS:  5.5 mCi Tc-52m  Choletec IV COMPARISON:  Ultrasound and CT examinations from 04/06/2021 and 04/05/2021, respectively FINDINGS: The patient had discomfort that he stated was due to reflux, and had a difficult time cooperating with the examination. We were able to obtain standard images through 15 minutes, after that he refused imaging until the 60 minute images. We obtained a lateral 85 minute image to help try to clarify whether the gallbladder was filling or not, but the patient rejected any further imaging and accordingly morphine administration could not be employed. The initial 15 minute images demonstrate satisfactory uptake of radiopharmaceutical from the blood pool. Biliary activity is visible at 11 minutes. The gallbladder was not visible at 15 minutes. The gallbladder was also not definitively seen at 60 minutes, although there was substantial bowel activity at this time. A collection anterior to the biliary tree on the lateral projection from 60 minutes was questionable for gallbladder activity versus gastric activity, but appeared to flush out when we administered the patient has some water to drink and  reperformed imaging at 85 minutes. Accordingly the gallbladder did not fill. IMPRESSION: 1. The CBD is patent and there is satisfactory uptake of radiopharmaceutical from the blood pool. 2. Cystic duct patency is not a stab list. We did not fill the gallbladder out through 85 minutes, but the patient terminated the exam prior to morphine administration and further imaging. Electronically Signed   By: Van Clines M.D.   On: 05/11/2021 14:41   VAS US CAROTID  Result Date: 04/15/2021 Carotid Arterial Duplex Study Patient Name:  PERCY WINTERROWD  Date of Exam:   04/14/2021 Medical Rec #: [id number]       Accession #:    [id number] Date of Birth: [birth year]       Patient Gender: M Patient Age:   38 years Exam Location:  [facility] Procedure:      VAS US CAROTID Referring Phys: TODD EARLY --------------------------------------------------------------------------------  Indications:       Carotid artery disease. Comparison Study:  09/16/16: Right 1-39% ICA stenosis. Left 40-59% ICA stenosis                    (213/47 cm/s).  Exam at [facility] performed 4 weeks ago is not availabe                    for comparison. Performing Technologist: Ralene Cork RVT  Examination Guidelines: A complete evaluation includes B-mode imaging, spectral Doppler, color Doppler, and power Doppler as needed of all accessible portions of each vessel. Bilateral testing is considered an integral part of a complete examination. Limited examinations for reoccurring indications may be performed as noted.  Right Carotid Findings: +----------+--------+--------+--------+---------------------+------------------+             PSV cm/s EDV cm/s Stenosis Plaque Description    Comments            +----------+--------+--------+--------+---------------------+------------------+  CCA Prox   180      11                heterogenous and                                                                 calcific                                   +----------+--------+--------+--------+---------------------+------------------+  CCA Mid    131      0                 heterogenous and      High resistant                                             calcific              flow                +----------+--------+--------+--------+---------------------+------------------+  CCA Distal 104      0                 heterogenous and      High resistant                                             calcific              flow                +----------+--------+--------+--------+---------------------+------------------+  ICA Prox   129      0        1-39%    calcific              High resistant                                                                   flow                +----------+--------+--------+--------+---------------------+------------------+  ICA Mid  64       9                                                           +----------+--------+--------+--------+---------------------+------------------+  ICA Distal 67       13                                                          +----------+--------+--------+--------+---------------------+------------------+  ECA        268      0        >50%     calcific                                  +----------+--------+--------+--------+---------------------+------------------+ +----------+--------+-------+---------+-------------------+             PSV cm/s EDV cms Describe  Arm Pressure (mmHG)  +----------+--------+-------+---------+-------------------+  Subclavian 193              Turbulent                      +----------+--------+-------+---------+-------------------+ +---------+--------+--+--------+----------------------+  Vertebral PSV cm/s 38 EDV cm/s Antegrade and Atypical  +---------+--------+--+--------+----------------------+  Left Carotid Findings: +----------+--------+--------+--------+------------------+---------------------+             PSV cm/s EDV cm/s Stenosis Plaque  Description Comments               +----------+--------+--------+--------+------------------+---------------------+  CCA Prox   133      0                 heterogenous       High resistant flow                                                              noted in the proximal                                                            CCA.                   +----------+--------+--------+--------+------------------+---------------------+  CCA Mid    142      0                 calcific           High resistant flow    +----------+--------+--------+--------+------------------+---------------------+  CCA Distal 128      0                 heterogenous       High resistant flow    +----------+--------+--------+--------+------------------+---------------------+  ICA Prox   400      68  60-79%   calcific                                  +----------+--------+--------+--------+------------------+---------------------+  ICA Mid    94       12                                                          +----------+--------+--------+--------+------------------+---------------------+  ICA Distal 77       15                                                          +----------+--------+--------+--------+------------------+---------------------+  ECA        245      0        >50%     calcific                                  +----------+--------+--------+--------+------------------+---------------------+ +----------+--------+--------+---------+-------------------+             PSV cm/s EDV cm/s Describe  Arm Pressure (mmHG)  +----------+--------+--------+---------+-------------------+  Subclavian 123               Turbulent                      +----------+--------+--------+---------+-------------------+ +---------+--------+--+--------+--+-----------------------------------+  Vertebral PSV cm/s 72 EDV cm/s 11 Antegrade and Systolic deceleration  +---------+--------+--+--------+--+-----------------------------------+   Summary: Right  Carotid: Velocities in the right ICA are consistent with a 1-39% stenosis.                High resistance flow in the CCA and ICA. The ECA appears >50%                stenosed. Left Carotid: Velocities in the left ICA are consistent with a 60-79% stenosis.               High resistance flow in the CCA and ICA. The ECA appears >50%               stenosed. Vertebrals:  Bilateral vertebral arteries demonstrate antegrade flow. Bilateral              vertebral waveforms are atypical. Subclavians: Bilateral subclavian artery flow was disturbed. *See table(s) above for measurements and observations.  Electronically signed by Deitra Mayo MD on 04/15/2021 at 8:17:10 AM.    Final      Discharge Exam: Vitals:   05/12/21 0518 05/12/21 0521  BP: (!) 131/40   Pulse: 76 76  Resp: 18   Temp: 98.4 F (36.9 C)   SpO2: 96% 96%   Vitals:   05/11/21 2157 05/11/21 2158 05/12/21 0518 05/12/21 0521  BP: (!) 150/61 (!) 150/61 (!) 131/40   Pulse: 84 84 76 76  Resp:  20 18   Temp:  98.6 F (37 C) 98.4 F (36.9 C)   TempSrc:  Oral Oral   SpO2:  95% 96% 96%  Weight:      Height:  General: Pt is alert, awake, not in acute distress Cardiovascular: RRR, S1/S2 +, no rubs, no gallops Respiratory: CTA bilaterally, no wheezing, no rhonchi Abdominal: Soft, NT, ND, bowel sounds + Extremities: no edema, no cyanosis    The results of significant diagnostics from this hospitalization (including imaging, microbiology, ancillary and laboratory) are listed below for reference.     Microbiology: Recent Results (from the past 240 hour(s))  Resp Panel by RT-PCR (Flu A&B, Covid) Nasopharyngeal Swab     Status: None   Collection Time: 05/11/21  5:10 AM   Specimen: Nasopharyngeal Swab; Nasopharyngeal(NP) swabs in vial transport medium  Result Value Ref Range Status   SARS Coronavirus 2 by RT PCR NEGATIVE NEGATIVE Final    Comment: (NOTE) SARS-CoV-2 target nucleic acids are NOT DETECTED.  The SARS-CoV-2  RNA is generally detectable in upper respiratory specimens during the acute phase of infection. The lowest concentration of SARS-CoV-2 viral copies this assay can detect is 138 copies/mL. A negative result does not preclude SARS-Cov-2 infection and should not be used as the sole basis for treatment or other patient management decisions. A negative result may occur with  improper specimen collection/handling, submission of specimen other than nasopharyngeal swab, presence of viral mutation(s) within the areas targeted by this assay, and inadequate number of viral copies(<138 copies/mL). A negative result must be combined with clinical observations, patient history, and epidemiological information. The expected result is Negative.  Fact Sheet for Patients:  EntrepreneurPulse.com.au  Fact Sheet for Healthcare Providers:  IncredibleEmployment.be  This test is no t yet approved or cleared by the [place] FDA and  has been authorized for detection and/or diagnosis of SARS-CoV-2 by FDA under an Emergency Use Authorization (EUA). This EUA will remain  in effect (meaning this test can be used) for the duration of the COVID-19 declaration under Section 564(b)(1) of the Act, 21 U.S.C.section 360bbb-3(b)(1), unless the authorization is terminated  or revoked sooner.       Influenza A by PCR NEGATIVE NEGATIVE Final   Influenza B by PCR NEGATIVE NEGATIVE Final    Comment: (NOTE) The Xpert Xpress SARS-CoV-2/FLU/RSV plus assay is intended as an aid in the diagnosis of influenza from Nasopharyngeal swab specimens and should not be used as a sole basis for treatment. Nasal washings and aspirates are unacceptable for Xpert Xpress SARS-CoV-2/FLU/RSV testing.  Fact Sheet for Patients: EntrepreneurPulse.com.au  Fact Sheet for Healthcare Providers: IncredibleEmployment.be  This test is not yet approved or cleared by the  [place] FDA and has been authorized for detection and/or diagnosis of SARS-CoV-2 by FDA under an Emergency Use Authorization (EUA). This EUA will remain in effect (meaning this test can be used) for the duration of the COVID-19 declaration under Section 564(b)(1) of the Act, 21 U.S.C. section 360bbb-3(b)(1), unless the authorization is terminated or revoked.  Performed at [facility], [address], [place], [place] [address]      Labs: BNP (last 3 results) Recent Labs    08/08/20 0810 08/10/20 0516 08/30/20 0449  BNP 2,081.0* 2,146.0* 0,177.9*   Basic Metabolic Panel: Recent Labs  Lab 05/11/21 0315  NA 134*   136  K 4.4   4.4  CL 96*   97*  CO2 29   29  GLUCOSE 102*   108*  BUN 37*   35*  CREATININE 4.43*   4.38*  CALCIUM 8.8*   8.8*  PHOS 2.4*   Liver Function Tests: Recent Labs  Lab 05/11/21 0315  AST 20  ALT 12  ALKPHOS 109  BILITOT 0.5  PROT 7.2  ALBUMIN 3.7   3.6   Recent Labs  Lab 05/11/21 0315  LIPASE 54*   No results for input(s): AMMONIA in the last 168 hours. CBC: Recent Labs  Lab 05/11/21 0315  WBC 7.7  NEUTROABS 6.3  HGB 9.4*  HCT 29.3*  MCV 98.7  PLT 132*   Cardiac Enzymes: No results for input(s): CKTOTAL, CKMB, CKMBINDEX, TROPONINI in the last 168 hours. BNP: Invalid input(s): POCBNP CBG: No results for input(s): GLUCAP in the last 168 hours. D-Dimer No results for input(s): DDIMER in the last 72 hours. Hgb A1c No results for input(s): HGBA1C in the last 72 hours. Lipid Profile No results for input(s): CHOL, HDL, LDLCALC, TRIG, CHOLHDL, LDLDIRECT in the last 72 hours. Thyroid function studies No results for input(s): TSH, T4TOTAL, T3FREE, THYROIDAB in the last 72 hours.  Invalid input(s): FREET3 Anemia work up No results for input(s): VITAMINB12, FOLATE, FERRITIN, TIBC, IRON, RETICCTPCT in the last 72 hours. Urinalysis    Component Value Date/Time   COLORURINE YELLOW 02/09/2020 [place]  02/09/2020 0958   LABSPEC 1.012 02/09/2020 0958   PHURINE 5.0 02/09/2020 0958   GLUCOSEU NEGATIVE 02/09/2020 0958   HGBUR NEGATIVE 02/09/2020 0958   BILIRUBINUR NEGATIVE 02/09/2020 0958   KETONESUR NEGATIVE 02/09/2020 0958   PROTEINUR 100 (A) 02/09/2020 0958   NITRITE NEGATIVE 02/09/2020 0958   LEUKOCYTESUR NEGATIVE 02/09/2020 0958   Sepsis Labs Invalid input(s): PROCALCITONIN,  WBC,  LACTICIDVEN Microbiology Recent Results (from the past 240 hour(s))  Resp Panel by RT-PCR (Flu A&B, Covid) Nasopharyngeal Swab     Status: None   Collection Time: 05/11/21  5:10 AM   Specimen: Nasopharyngeal Swab; Nasopharyngeal(NP) swabs in vial transport medium  Result Value Ref Range Status   SARS Coronavirus 2 by RT PCR NEGATIVE NEGATIVE Final    Comment: (NOTE) SARS-CoV-2 target nucleic acids are NOT DETECTED.  The SARS-CoV-2 RNA is generally detectable in upper respiratory specimens during the acute phase of infection. The lowest concentration of SARS-CoV-2 viral copies this assay can detect is 138 copies/mL. A negative result does not preclude SARS-Cov-2 infection and should not be used as the sole basis for treatment or other patient management decisions. A negative result may occur with  improper specimen collection/handling, submission of specimen other than nasopharyngeal swab, presence of viral mutation(s) within the areas targeted by this assay, and inadequate number of viral copies(<138 copies/mL). A negative result must be combined with clinical observations, patient history, and epidemiological information. The expected result is Negative.  Fact Sheet for Patients:  EntrepreneurPulse.com.au  Fact Sheet for Healthcare Providers:  IncredibleEmployment.be  This test is no t yet approved or cleared by the [place] FDA and  has been authorized for detection and/or diagnosis of SARS-CoV-2 by FDA under an Emergency Use Authorization (EUA). This  EUA will remain  in effect (meaning this test can be used) for the duration of the COVID-19 declaration under Section 564(b)(1) of the Act, 21 U.S.C.section 360bbb-3(b)(1), unless the authorization is terminated  or revoked sooner.       Influenza A by PCR NEGATIVE NEGATIVE Final   Influenza B by PCR NEGATIVE NEGATIVE Final    Comment: (NOTE) The Xpert Xpress SARS-CoV-2/FLU/RSV plus assay is intended as an aid in the diagnosis of influenza from Nasopharyngeal swab specimens and should not be used as a sole basis for treatment. Nasal washings and aspirates are unacceptable for Xpert Xpress SARS-CoV-2/FLU/RSV testing.  Fact Sheet for  Patients: EntrepreneurPulse.com.au  Fact Sheet for Healthcare Providers: IncredibleEmployment.be  This test is not yet approved or cleared by the [place] FDA and has been authorized for detection and/or diagnosis of SARS-CoV-2 by FDA under an Emergency Use Authorization (EUA). This EUA will remain in effect (meaning this test can be used) for the duration of the COVID-19 declaration under Section 564(b)(1) of the Act, 21 U.S.C. section 360bbb-3(b)(1), unless the authorization is terminated or revoked.  Performed at [facility], [address] [place], [place] [address]      Time coordinating discharge: 35 minutes  SIGNED:   Rodena Goldmann, DO [facility] 05/12/2021, 11:31 AM  If 7PM-7AM, please contact night-coverage [email or web address]

## 2021-05-12 NOTE — Plan of Care (Signed)

## 2021-05-14 ENCOUNTER — Other Ambulatory Visit: Payer: Self-pay

## 2021-05-14 ENCOUNTER — Other Ambulatory Visit: Payer: Self-pay | Admitting: *Deleted

## 2021-05-14 NOTE — Patient Outreach (Signed)
[name]) Care Management  05/14/2021  [name] Dec 11, 1945 [id number]  Last outreach for [place] Management services. Pt was admitted again on 12/27 - 41/93 for biliary colic which resolved without surgery. This is second admission for the same complaint and just over the 30 day readmission time frame . Pt is scheduled for dialysis on M-W-F and today is a Friday. Left message and requested a return call.  Called [name]'s cell and she advised [name] just arrived home form having had dialysis. He reports he doesn't recall getting the information that was sent regarding our services. Explained services again to him and how NP would support him. He does not feel like he needs the service and declines at this time. NP reiterated that he can call at any time in the future to enroll.  [name]. [name], MSN, [facility] Gerontological Nurse Practitioner [facility] Care Management [phone]

## 2021-05-23 ENCOUNTER — Encounter (HOSPITAL_COMMUNITY): Payer: Self-pay | Admitting: Emergency Medicine

## 2021-05-23 ENCOUNTER — Inpatient Hospital Stay (HOSPITAL_COMMUNITY): Payer: No Typology Code available for payment source

## 2021-05-23 ENCOUNTER — Inpatient Hospital Stay (HOSPITAL_COMMUNITY)
Admission: EM | Admit: 2021-05-23 | Discharge: 2021-05-29 | DRG: 280 | Disposition: A | Discharge status: Home / Self Care | Payer: No Typology Code available for payment source | Attending: Internal Medicine | Admitting: Internal Medicine

## 2021-05-23 ENCOUNTER — Emergency Department (HOSPITAL_COMMUNITY): Payer: No Typology Code available for payment source

## 2021-05-23 ENCOUNTER — Other Ambulatory Visit: Payer: Self-pay

## 2021-05-23 DIAGNOSIS — R52 Pain, unspecified: Secondary | ICD-10-CM | POA: Diagnosis not present

## 2021-05-23 DIAGNOSIS — Z951 Presence of aortocoronary bypass graft: Secondary | ICD-10-CM | POA: Diagnosis not present

## 2021-05-23 DIAGNOSIS — I10 Essential (primary) hypertension: Secondary | ICD-10-CM | POA: Diagnosis present

## 2021-05-23 DIAGNOSIS — Z8673 Personal history of transient ischemic attack (TIA), and cerebral infarction without residual deficits: Secondary | ICD-10-CM

## 2021-05-23 DIAGNOSIS — Z515 Encounter for palliative care: Secondary | ICD-10-CM | POA: Diagnosis not present

## 2021-05-23 DIAGNOSIS — I252 Old myocardial infarction: Secondary | ICD-10-CM

## 2021-05-23 DIAGNOSIS — Z66 Do not resuscitate: Secondary | ICD-10-CM | POA: Diagnosis present

## 2021-05-23 DIAGNOSIS — Z7189 Other specified counseling: Secondary | ICD-10-CM | POA: Diagnosis not present

## 2021-05-23 DIAGNOSIS — Z79890 Hormone replacement therapy (postmenopausal): Secondary | ICD-10-CM | POA: Diagnosis not present

## 2021-05-23 DIAGNOSIS — E039 Hypothyroidism, unspecified: Secondary | ICD-10-CM | POA: Diagnosis present

## 2021-05-23 DIAGNOSIS — M109 Gout, unspecified: Secondary | ICD-10-CM | POA: Diagnosis present

## 2021-05-23 DIAGNOSIS — Z7902 Long term (current) use of antithrombotics/antiplatelets: Secondary | ICD-10-CM

## 2021-05-23 DIAGNOSIS — Z955 Presence of coronary angioplasty implant and graft: Secondary | ICD-10-CM

## 2021-05-23 DIAGNOSIS — I25700 Atherosclerosis of coronary artery bypass graft(s), unspecified, with unstable angina pectoris: Secondary | ICD-10-CM | POA: Diagnosis present

## 2021-05-23 DIAGNOSIS — I214 Non-ST elevation (NSTEMI) myocardial infarction: Secondary | ICD-10-CM | POA: Diagnosis present

## 2021-05-23 DIAGNOSIS — D6489 Other specified anemias: Secondary | ICD-10-CM | POA: Diagnosis not present

## 2021-05-23 DIAGNOSIS — J9601 Acute respiratory failure with hypoxia: Secondary | ICD-10-CM | POA: Diagnosis present

## 2021-05-23 DIAGNOSIS — N186 End stage renal disease: Secondary | ICD-10-CM | POA: Diagnosis present

## 2021-05-23 DIAGNOSIS — I25110 Atherosclerotic heart disease of native coronary artery with unstable angina pectoris: Secondary | ICD-10-CM | POA: Diagnosis present

## 2021-05-23 DIAGNOSIS — E782 Mixed hyperlipidemia: Secondary | ICD-10-CM | POA: Diagnosis present

## 2021-05-23 DIAGNOSIS — G4733 Obstructive sleep apnea (adult) (pediatric): Secondary | ICD-10-CM | POA: Diagnosis present

## 2021-05-23 DIAGNOSIS — M898X9 Other specified disorders of bone, unspecified site: Secondary | ICD-10-CM | POA: Diagnosis present

## 2021-05-23 DIAGNOSIS — R0602 Shortness of breath: Secondary | ICD-10-CM | POA: Diagnosis present

## 2021-05-23 DIAGNOSIS — D638 Anemia in other chronic diseases classified elsewhere: Secondary | ICD-10-CM | POA: Diagnosis not present

## 2021-05-23 DIAGNOSIS — Z87891 Personal history of nicotine dependence: Secondary | ICD-10-CM

## 2021-05-23 DIAGNOSIS — K219 Gastro-esophageal reflux disease without esophagitis: Secondary | ICD-10-CM | POA: Diagnosis present

## 2021-05-23 DIAGNOSIS — Z20822 Contact with and (suspected) exposure to covid-19: Secondary | ICD-10-CM | POA: Diagnosis present

## 2021-05-23 DIAGNOSIS — Z743 Need for continuous supervision: Secondary | ICD-10-CM | POA: Diagnosis not present

## 2021-05-23 DIAGNOSIS — J449 Chronic obstructive pulmonary disease, unspecified: Secondary | ICD-10-CM | POA: Diagnosis present

## 2021-05-23 DIAGNOSIS — Z888 Allergy status to other drugs, medicaments and biological substances status: Secondary | ICD-10-CM

## 2021-05-23 DIAGNOSIS — N400 Enlarged prostate without lower urinary tract symptoms: Secondary | ICD-10-CM | POA: Diagnosis present

## 2021-05-23 DIAGNOSIS — I5033 Acute on chronic diastolic (congestive) heart failure: Secondary | ICD-10-CM | POA: Diagnosis present

## 2021-05-23 DIAGNOSIS — D631 Anemia in chronic kidney disease: Secondary | ICD-10-CM | POA: Diagnosis present

## 2021-05-23 DIAGNOSIS — F32A Depression, unspecified: Secondary | ICD-10-CM | POA: Diagnosis not present

## 2021-05-23 DIAGNOSIS — Z8551 Personal history of malignant neoplasm of bladder: Secondary | ICD-10-CM

## 2021-05-23 DIAGNOSIS — I132 Hypertensive heart and chronic kidney disease with heart failure and with stage 5 chronic kidney disease, or end stage renal disease: Secondary | ICD-10-CM | POA: Diagnosis present

## 2021-05-23 DIAGNOSIS — R0689 Other abnormalities of breathing: Secondary | ICD-10-CM | POA: Diagnosis not present

## 2021-05-23 DIAGNOSIS — I499 Cardiac arrhythmia, unspecified: Secondary | ICD-10-CM | POA: Diagnosis not present

## 2021-05-23 DIAGNOSIS — I472 Ventricular tachycardia, unspecified: Secondary | ICD-10-CM | POA: Diagnosis not present

## 2021-05-23 DIAGNOSIS — I2510 Atherosclerotic heart disease of native coronary artery without angina pectoris: Secondary | ICD-10-CM | POA: Diagnosis not present

## 2021-05-23 DIAGNOSIS — Z992 Dependence on renal dialysis: Secondary | ICD-10-CM | POA: Diagnosis not present

## 2021-05-23 DIAGNOSIS — Z8249 Family history of ischemic heart disease and other diseases of the circulatory system: Secondary | ICD-10-CM

## 2021-05-23 DIAGNOSIS — I350 Nonrheumatic aortic (valve) stenosis: Secondary | ICD-10-CM | POA: Diagnosis present

## 2021-05-23 DIAGNOSIS — Z79899 Other long term (current) drug therapy: Secondary | ICD-10-CM

## 2021-05-23 DIAGNOSIS — M1990 Unspecified osteoarthritis, unspecified site: Secondary | ICD-10-CM | POA: Diagnosis present

## 2021-05-23 LAB — CBC
HCT: 25.5 % — ABNORMAL LOW (ref 39.0–52.0)
Hemoglobin: 8.3 g/dL — ABNORMAL LOW (ref 13.0–17.0)
MCH: 31.9 pg (ref 26.0–34.0)
MCHC: 32.5 g/dL (ref 30.0–36.0)
MCV: 98.1 fL (ref 80.0–100.0)
Platelets: 125 10*3/uL — ABNORMAL LOW (ref 150–400)
RBC: 2.6 MIL/uL — ABNORMAL LOW (ref 4.22–5.81)
RDW: 14.7 % (ref 11.5–15.5)
WBC: 8.1 10*3/uL (ref 4.0–10.5)
nRBC: 0 % (ref 0.0–0.2)

## 2021-05-23 LAB — BASIC METABOLIC PANEL
Anion gap: 12 (ref 5–15)
BUN: 41 mg/dL — ABNORMAL HIGH (ref 8–23)
CO2: 29 mmol/L (ref 22–32)
Calcium: 8.8 mg/dL — ABNORMAL LOW (ref 8.9–10.3)
Chloride: 95 mmol/L — ABNORMAL LOW (ref 98–111)
Creatinine, Ser: 4.9 mg/dL — ABNORMAL HIGH (ref 0.61–1.24)
GFR, Estimated: 12 mL/min — ABNORMAL LOW (ref >60)
Glucose, Bld: 128 mg/dL — ABNORMAL HIGH (ref 70–99)
Potassium: 4.4 mmol/L (ref 3.5–5.1)
Sodium: 136 mmol/L (ref 135–145)

## 2021-05-23 LAB — HEPATIC FUNCTION PANEL
ALT: 12 U/L (ref 0–44)
AST: 18 U/L (ref 15–41)
Albumin: 3.3 g/dL — ABNORMAL LOW (ref 3.5–5.0)
Alkaline Phosphatase: 93 U/L (ref 38–126)
Bilirubin, Direct: 0.1 mg/dL (ref 0.0–0.2)
Indirect Bilirubin: 0.7 mg/dL (ref 0.3–0.9)
Total Bilirubin: 0.8 mg/dL (ref 0.3–1.2)
Total Protein: 7.1 g/dL (ref 6.5–8.1)

## 2021-05-23 LAB — RESP PANEL BY RT-PCR (FLU A&B, COVID) ARPGX2
Influenza A by PCR: NEGATIVE
Influenza B by PCR: NEGATIVE
SARS Coronavirus 2 by RT PCR: NEGATIVE

## 2021-05-23 LAB — TROPONIN I (HIGH SENSITIVITY)
Troponin I (High Sensitivity): 505 ng/L (ref <18)
Troponin I (High Sensitivity): 1361 ng/L (ref <18)
Troponin I (High Sensitivity): 3951 ng/L (ref <18)
Troponin I (High Sensitivity): 11751 ng/L (ref <18)

## 2021-05-23 LAB — HEPARIN LEVEL (UNFRACTIONATED)
Heparin Unfractionated: 0.3 IU/mL (ref 0.30–0.70)
Heparin Unfractionated: 0.31 IU/mL (ref 0.30–0.70)

## 2021-05-23 LAB — BRAIN NATRIURETIC PEPTIDE: B Natriuretic Peptide: 3900 pg/mL — ABNORMAL HIGH (ref 0.0–100.0)

## 2021-05-23 LAB — LIPASE, BLOOD: Lipase: 44 U/L (ref 11–51)

## 2021-05-23 LAB — MRSA NEXT GEN BY PCR, NASAL: MRSA by PCR Next Gen: NOT DETECTED

## 2021-05-23 MED ORDER — FUROSEMIDE 10 MG/ML IJ SOLN
80.0000 mg | Freq: Once | INTRAMUSCULAR | Status: AC
  Administered 2021-05-23: 80 mg via INTRAVENOUS
  Filled 2021-05-23: qty 8

## 2021-05-23 MED ORDER — ACETAMINOPHEN 650 MG RE SUPP: 650.0000 mg | Freq: Four times a day (QID) | RECTAL | Status: DC | PRN

## 2021-05-23 MED ORDER — SODIUM CHLORIDE 0.9 % IV SOLN: 100.0000 mL | INTRAVENOUS | Status: DC | PRN

## 2021-05-23 MED ORDER — LEVOTHYROXINE SODIUM 50 MCG PO TABS
50.0000 ug | ORAL_TABLET | Freq: Every day | ORAL | Status: DC
  Administered 2021-05-24 – 2021-05-29 (×6): 50 ug via ORAL
  Filled 2021-05-23 (×5): qty 1
  Filled 2021-05-23: qty 2

## 2021-05-23 MED ORDER — ASPIRIN 325 MG PO TABS
325.0000 mg | ORAL_TABLET | Freq: Every day | ORAL | Status: DC
  Administered 2021-05-24: 325 mg via ORAL
  Filled 2021-05-23 (×3): qty 1

## 2021-05-23 MED ORDER — CARVEDILOL 3.125 MG PO TABS: 3.1250 mg | ORAL_TABLET | Freq: Two times a day (BID) | ORAL | Status: DC

## 2021-05-23 MED ORDER — SODIUM CHLORIDE 0.9% FLUSH
3.0000 mL | Freq: Two times a day (BID) | INTRAVENOUS | Status: DC
  Administered 2021-05-23 – 2021-05-27 (×9): 3 mL via INTRAVENOUS

## 2021-05-23 MED ORDER — CLOPIDOGREL BISULFATE 75 MG PO TABS
75.0000 mg | ORAL_TABLET | Freq: Every day | ORAL | Status: DC
  Administered 2021-05-24 – 2021-05-29 (×6): 75 mg via ORAL
  Filled 2021-05-23 (×7): qty 1

## 2021-05-23 MED ORDER — SODIUM CHLORIDE 0.9% FLUSH
3.0000 mL | INTRAVENOUS | Status: DC | PRN
  Administered 2021-05-24: 3 mL via INTRAVENOUS

## 2021-05-23 MED ORDER — NITROGLYCERIN IN D5W 200-5 MCG/ML-% IV SOLN
5.0000 ug/min | INTRAVENOUS | Status: DC
  Administered 2021-05-23: 5 ug/min via INTRAVENOUS
  Filled 2021-05-23: qty 250

## 2021-05-23 MED ORDER — PANTOPRAZOLE SODIUM 40 MG PO TBEC
40.0000 mg | DELAYED_RELEASE_TABLET | Freq: Every day | ORAL | Status: DC
  Administered 2021-05-24 – 2021-05-25 (×2): 40 mg via ORAL
  Filled 2021-05-23 (×2): qty 1

## 2021-05-23 MED ORDER — SODIUM CHLORIDE 0.9 % IV SOLN: 250.0000 mL | INTRAVENOUS | Status: DC | PRN

## 2021-05-23 MED ORDER — HEPARIN (PORCINE) 25000 UT/250ML-% IV SOLN
1650.0000 [IU]/h | INTRAVENOUS | Status: DC
  Administered 2021-05-23: 1200 [IU]/h via INTRAVENOUS
  Administered 2021-05-23: 1300 [IU]/h via INTRAVENOUS
  Administered 2021-05-24: 1600 [IU]/h via INTRAVENOUS
  Administered 2021-05-25 – 2021-05-26 (×3): 1750 [IU]/h via INTRAVENOUS
  Administered 2021-05-27: 1650 [IU]/h via INTRAVENOUS
  Administered 2021-05-27: 1750 [IU]/h via INTRAVENOUS
  Filled 2021-05-23 (×8): qty 250

## 2021-05-23 MED ORDER — PENTAFLUOROPROP-TETRAFLUOROETH EX AERO
1.0000 "application " | INHALATION_SPRAY | CUTANEOUS | Status: DC | PRN
  Filled 2021-05-23: qty 30

## 2021-05-23 MED ORDER — ASPIRIN 81 MG PO CHEW
324.0000 mg | CHEWABLE_TABLET | Freq: Once | ORAL | Status: AC
  Administered 2021-05-23: 324 mg via ORAL
  Filled 2021-05-23: qty 4

## 2021-05-23 MED ORDER — CHLORHEXIDINE GLUCONATE CLOTH 2 % EX PADS: 6.0000 | MEDICATED_PAD | Freq: Every day | CUTANEOUS | Status: DC

## 2021-05-23 MED ORDER — LIDOCAINE-PRILOCAINE 2.5-2.5 % EX CREA
1.0000 "application " | TOPICAL_CREAM | CUTANEOUS | Status: DC | PRN
  Filled 2021-05-23: qty 5

## 2021-05-23 MED ORDER — HEPARIN BOLUS VIA INFUSION
3000.0000 [IU] | Freq: Once | INTRAVENOUS | Status: AC
  Administered 2021-05-23: 3000 [IU] via INTRAVENOUS

## 2021-05-23 MED ORDER — ONDANSETRON HCL 4 MG/2ML IJ SOLN: 4.0000 mg | Freq: Four times a day (QID) | INTRAMUSCULAR | Status: DC | PRN

## 2021-05-23 MED ORDER — NITROGLYCERIN 0.4 MG SL SUBL
0.4000 mg | SUBLINGUAL_TABLET | SUBLINGUAL | Status: DC | PRN
  Administered 2021-05-23 (×2): 0.4 mg via SUBLINGUAL
  Filled 2021-05-23: qty 1

## 2021-05-23 MED ORDER — ONDANSETRON HCL 4 MG PO TABS: 4.0000 mg | ORAL_TABLET | Freq: Four times a day (QID) | ORAL | Status: DC | PRN

## 2021-05-23 MED ORDER — ATORVASTATIN CALCIUM 80 MG PO TABS
80.0000 mg | ORAL_TABLET | Freq: Every day | ORAL | Status: DC
  Administered 2021-05-24 – 2021-05-29 (×6): 80 mg via ORAL
  Filled 2021-05-23 (×2): qty 1
  Filled 2021-05-23: qty 2
  Filled 2021-05-23 (×2): qty 1
  Filled 2021-05-23: qty 2

## 2021-05-23 MED ORDER — ISOSORBIDE MONONITRATE ER 60 MG PO TB24: 120.0000 mg | ORAL_TABLET | Freq: Every day | ORAL | Status: DC

## 2021-05-23 MED ORDER — HYDROMORPHONE HCL 1 MG/ML IJ SOLN: 0.5000 mg | INTRAMUSCULAR | Status: DC | PRN

## 2021-05-23 MED ORDER — LIDOCAINE HCL (PF) 1 % IJ SOLN: 5.0000 mL | INTRAMUSCULAR | Status: DC | PRN

## 2021-05-23 MED ORDER — ACETAMINOPHEN 325 MG PO TABS: 650.0000 mg | ORAL_TABLET | Freq: Four times a day (QID) | ORAL | Status: DC | PRN

## 2021-05-23 MED ORDER — NITROGLYCERIN IN D5W 200-5 MCG/ML-% IV SOLN
0.0000 ug/min | INTRAVENOUS | Status: DC
  Administered 2021-05-23: 15 ug/min via INTRAVENOUS

## 2021-05-23 MED ORDER — CARVEDILOL 6.25 MG PO TABS
6.2500 mg | ORAL_TABLET | Freq: Two times a day (BID) | ORAL | Status: DC
  Administered 2021-05-24 – 2021-05-29 (×10): 6.25 mg via ORAL
  Filled 2021-05-23: qty 1
  Filled 2021-05-23 (×2): qty 2
  Filled 2021-05-23 (×2): qty 1
  Filled 2021-05-23 (×2): qty 2
  Filled 2021-05-23 (×5): qty 1

## 2021-05-23 NOTE — Progress Notes (Signed)
° °  Progress Note  Patient Name: GERRIT RAFALSKI Date of Encounter: 05/23/2021  [facility] Cardiologist: Carlyle Dolly, MD   Case reviewed    Pt with severe diffuse disease.   S/P CABG with occluded grafts    Distal vessels severely diseased.    He has not been considered a cath candidate in past.  We will see the patient in consult.   Recomm maxiimizing medical Rx      Signed, Dorris Carnes, MD  05/23/2021, 8:12 AM

## 2021-05-23 NOTE — ED Provider Notes (Addendum)
°  Physical Exam  BP (!) 145/54    Pulse 79    Temp 98 F (36.7 C) (Oral)    Resp (!) 28    Ht 5\' 9"  (1.753 m)    Wt 83.5 kg    SpO2 96%    BMI 27.17 kg/m   Physical Exam  Procedures  .Critical Care Performed by: Varney Biles, MD Authorized by: Varney Biles, MD   Critical care provider statement:    Critical care time (minutes):  35   Critical care was necessary to treat or prevent imminent or life-threatening deterioration of the following conditions:  Cardiac failure   Critical care was time spent personally by me on the following activities:  Development of treatment plan with patient or surrogate, discussions with consultants, evaluation of patient's response to treatment, examination of patient, ordering and review of laboratory studies, ordering and review of radiographic studies, ordering and performing treatments and interventions, pulse oximetry, re-evaluation of patient's condition and review of old charts  ED Course / MDM    Medical Decision Making   76 year old male with history of ESRD on HD Monday, Wednesday, Friday, CAD with multivessel disease comes in with chief complaint of chest pain.  Patient was seen by Dr. Betsey Holiday, and found to have NSTEMI. Patient awaiting bed at [facility].  High-sensitivity troponins have risen from 100s to over thousand. He is on heparin drip.  Home medications ordered.  BP stable.  Patient reassessed.  Denies any chest pain at this time.  8:00 am I discussed the case with Dr. Harrington Challenger, cardiology. She indicated that patient likely needs to be a medicine admission given his comorbidities.  She also wonders about transfer time to [facility] being prolonged.  We discussed patient's prior catheterization results, and I asked if patient is amenable to any interventional therapy.  She reviewed the case and returned the call and inform me that patient is not a candidate for catheterization.  Therefore, it might be best to keep the patient at  [facility] long.  I requested that they enter a note if possible, and we will proceed with medicine admission.  8:30 AM: Patient made aware of the plan.  He usually wears CPAP at night, did not last night.  He was given IV Lasix 80 mg and had about 100 250 cc output of urine.  He still getting low short of breath, he appears to be likely volume overloaded.  I will repeat a chest x-ray, but we will place him on BiPAP. Medicine has accepted the patient.  Patient continues to not have any severe chest pain.  His troponin continues to rise.  CODE STATUS discussed. Patient does not want CPR and does not want to be placed on a ventilator long-term. Patient's wife at the bedside when the conversation occurred.     Varney Biles, MD 05/23/21 [place], [place], MD 05/23/21 [phone]

## 2021-05-23 NOTE — Progress Notes (Signed)
ANTICOAGULATION CONSULT NOTE - Initial Consult  Pharmacy Consult for heparin Indication: chest pain/ACS  Allergies  Allergen Reactions   Cardizem Cd [Diltiazem Hcl Er Beads] Palpitations    "Makes heart skip"   Cardizem [Diltiazem]     Patient Measurements: Height: 5\' 9"  (175.3 cm) Weight: 83.5 kg (184 lb) IBW/kg (Calculated) : 70.7  Vital Signs: Temp: 98 F (36.7 C) (01/08 0213) Temp Source: Oral (01/08 0213) BP: 137/54 (01/08 0330) Pulse Rate: 92 (01/08 0330)  Labs: Recent Labs    05/23/21 0217  HGB 8.3*  HCT 25.5*  PLT 125*  CREATININE 4.90*  TROPONINIHS 505*    Estimated Creatinine Clearance: 13.2 mL/min (A) (by C-G formula based on SCr of 4.9 mg/dL (H)).   Medical History: Past Medical History:  Diagnosis Date   Acute myocardial infarction of other inferior wall, initial episode of care    Anemia    Anginal pain ([place])    Anxiety    Arthritis    Cancer ([place])    bladder 2013 removed, has cystoscopy 10/2016, has returned x 2   COPD (chronic obstructive pulmonary disease) ([place])    Coronary artery disease    Artery bypass graft Jan 2008   Dialysis patient [facility])    Dyspnea    Dysrhythmia    GERD (gastroesophageal reflux disease)    Heart murmur    History of hiatal hernia    Hyperlipidemia    Hypertension    Hypothyroidism    PONV (postoperative nausea and vomiting)    Renal disorder    Sleep apnea    Stroke ([place])    Tobacco user     Assessment: 75yo male c/o "heartburn" associated with diaphoresis but on arrival to ED ECG reveals global ischemia >> troponin elevated >> to begin heparin.  Goal of Therapy:  Heparin level 0.3-0.7 units/ml Monitor platelets by anticoagulation protocol: Yes   Plan:  Heparin 3000 units IV bolus x1 followed by infusion at 1200 units/hr. Monitor heparin levels and CBC.  Wynona Neat, PharmD, BCPS  05/23/2021,3:33 AM

## 2021-05-23 NOTE — ED Notes (Signed)
RT called for CPAP at this time per Dr. Kathrynn Humble.

## 2021-05-23 NOTE — ED Notes (Signed)
Nurse tech placed pt on bedside commode for BM. This nurse assessed pt following ambulation and noted increased SOB, tachycardia, and decreased O2 sats. O2 increased and RT called.

## 2021-05-23 NOTE — ED Triage Notes (Signed)
Pt brought in by [facility] for heart burn. Pt has hx of same, was told it was from his gallbladder but hasn't had surgery to have it removed.

## 2021-05-23 NOTE — ED Notes (Signed)
Pt moved to recliner for comfort. Not able to tolerate sitting or lying in the bed due to breathing difficulty and back pain. Wife at bedside.

## 2021-05-23 NOTE — Progress Notes (Signed)
ANTICOAGULATION CONSULT NOTE - Initial Consult  Pharmacy Consult for heparin Indication: chest pain/ACS  Allergies  Allergen Reactions   Cardizem Cd [Diltiazem Hcl Er Beads] Palpitations    "Makes heart skip"   Cardizem [Diltiazem]     Patient Measurements: Height: 5\' 9"  (175.3 cm) Weight: 83.5 kg (184 lb 1.4 oz) IBW/kg (Calculated) : 70.7  Vital Signs: Temp: 98.2 F (36.8 C) (01/08 2145) Temp Source: Oral (01/08 2145) BP: 122/19 (01/08 2200) Pulse Rate: 89 (01/08 2200)  Labs: Recent Labs    05/23/21 0217 05/23/21 0417 05/23/21 0723 05/23/21 1147 05/23/21 2152  HGB 8.3*  --   --   --   --   HCT 25.5*  --   --   --   --   PLT 125*  --   --   --   --   HEPARINUNFRC  --   --   --  0.30 0.31  CREATININE 4.90*  --   --   --   --   TROPONINIHS 505* 1,361* 3,951*  --   --      Estimated Creatinine Clearance: 13.2 mL/min (A) (by C-G formula based on SCr of 4.9 mg/dL (H)).   Medical History: Past Medical History:  Diagnosis Date   Acute myocardial infarction of other inferior wall, initial episode of care    Anemia    Anginal pain ([place])    Anxiety    Arthritis    Cancer ([place])    bladder 2013 removed, has cystoscopy 10/2016, has returned x 2   COPD (chronic obstructive pulmonary disease) ([place])    Coronary artery disease    Artery bypass graft Jan 2008   Dialysis patient [facility])    Dyspnea    Dysrhythmia    GERD (gastroesophageal reflux disease)    Heart murmur    History of hiatal hernia    Hyperlipidemia    Hypertension    Hypothyroidism    PONV (postoperative nausea and vomiting)    Renal disorder    Sleep apnea    Stroke ([place])    Tobacco user     Assessment: 76yo male c/o "heartburn" associated with diaphoresis but on arrival to ED ECG reveals global ischemia >> troponin elevated >> to begin heparin.  HL 0.31, therapeutic but remains on low end.  Patient transferring to [facility] to be evaluated by Cardiology  Goal of Therapy:  Heparin level 0.3-0.7  units/ml Monitor platelets by anticoagulation protocol: Yes   Plan:  Increase heparin infusion slightly to 1350 units/hr. Monitor daily heparin levels and CBC.  Albertina Parr, PharmD., BCPS, BCCCP Clinical Pharmacist Please refer to [facility] for unit-specific pharmacist

## 2021-05-23 NOTE — H&P (Signed)
History and Physical    [name] HCW:[id number] DOB: June 13, 1945 DOA: 05/23/2021  PCP: [facility]   Patient coming from: Home  I have personally briefly reviewed patient's old medical records in [place]  Chief Complaint: Chest pain and heart burn  HPI: [name] is a 76 y.o. male with medical history significant of end-stage renal disease on hemodialysis (Monday, Wednesday, Friday); history of bladder cancer diagnosed in 2013 actively followed by urologist at [facility] (last visit documented on epic suggesting remission), history of BPH, hypertension, hyperlipidemia, cholelithiasis/biliary colic, hypothyroidism, gastroesophageal flux disease, history of coronary artery disease status post CABG and chronic diastolic heart failure; who presented to the hospital secondary to chest pain and heartburn. Pain is localized in the mid chest/epigastric region, non-radiated, 8/10 in intensity, constant and associated with sweating/diaphoresis and SOB. Patient also reported that the pain is worse with exertion and got relieved by the use of nitroglycerin.  Patient is now vaccinated against COVID; COVID PCR in the ER negative.  ED Course: Work-up has demonstrated significant ascending elevation in his troponin (505>>1361>>3951), chest x-ray demonstrating vascular congestion and increased lung bases markings, no acute infiltrates; patient is afebrile, with a stable blood pressure and normal WBCs.  EKG demonstrated sinus tachycardia with abnormal repolarization and concerns for global ischemia. (New findings since December).  Case was discussed with cardiology service on call who recommended initiation of heparin drip as part of treatment for NSTEMI.  Further discussion with on-call cardiology today's morning (Dr. Harrington Challenger) has trigger the patient is not a candidate for cardiac cath and to focus on medical management.  As he was a still experiencing discomfort nitroglycerin drip was  also started and to alleviate complaining of shortness of breath patient was placed on BiPAP.  TRH has been consulted to place in the hospital for further evaluation and management.  Review of Systems: As per HPI otherwise all other systems reviewed and are negative.  Past Medical History:  Diagnosis Date   Acute myocardial infarction of other inferior wall, initial episode of care    Anemia    Anginal pain ([place])    Anxiety    Arthritis    Cancer ([place])    bladder 2013 removed, has cystoscopy 10/2016, has returned x 2   COPD (chronic obstructive pulmonary disease) ([place])    Coronary artery disease    Artery bypass graft Jan 2008   Dialysis patient [facility])    Dyspnea    Dysrhythmia    GERD (gastroesophageal reflux disease)    Heart murmur    History of hiatal hernia    Hyperlipidemia    Hypertension    Hypothyroidism    PONV (postoperative nausea and vomiting)    Renal disorder    Sleep apnea    Stroke [facility])    Tobacco user     Past Surgical History:  Procedure Laterality Date   APPENDECTOMY     AV FISTULA PLACEMENT Right 02/11/2020   Procedure: RIGHT ARM BRACHIOCEPHALIC ARTERIOVENOUS (AV) FISTULA;  Surgeon: Angelia Mould, MD;  Location: [place];  Service: Vascular;  Laterality: Right;   CARDIAC CATHETERIZATION     COLONOSCOPY WITH PROPOFOL N/A 09/02/2020   Procedure: COLONOSCOPY WITH PROPOFOL;  Surgeon: Rogene Houston, MD;  [facility];  Service: Endoscopy;  Laterality: N/A;   CORONARY ARTERY BYPASS GRAFT  06/14/2006   x 5   CORONARY ARTERY BYPASS GRAFT N/A 09/19/2016   Procedure: REDO CORONARY ARTERY BYPASS GRAFTING (CABG) x two,  using right internal mammary artery and left radial artery;  Surgeon: [name], MD;  Location: [place];  Service: Open Heart Surgery;  Laterality: N/A;   FOOT SURGERY     HEMOSTASIS CLIP PLACEMENT  09/02/2020   Procedure: HEMOSTASIS CLIP PLACEMENT;  Surgeon: Rogene Houston, MD;  [facility];  Service: Endoscopy;;    HERNIA REPAIR     HOT HEMOSTASIS  09/02/2020   Procedure: HOT HEMOSTASIS (ARGON PLASMA COAGULATION/BICAP);  Surgeon: Rogene Houston, MD;  [facility];  Service: Endoscopy;;   LEFT HEART CATH AND CORS/GRAFTS ANGIOGRAPHY N/A 09/14/2016   Procedure: Left Heart Cath and Cors/Grafts Angiography;  Surgeon: Troy Sine, MD;  Location: [place] CV LAB;  Service: Cardiovascular;  Laterality: N/A;   LEFT HEART CATHETERIZATION WITH CORONARY/GRAFT ANGIOGRAM N/A 01/31/2014   Procedure: LEFT HEART CATHETERIZATION WITH Beatrix Fetters;  Surgeon: Sanda Klein, MD;  Location: [place] CATH LAB;  Service: Cardiovascular;  Laterality: N/A;   NOSE SURGERY     PERCUTANEOUS CORONARY STENT INTERVENTION (PCI-S)  01/31/2014   Procedure: PERCUTANEOUS CORONARY STENT INTERVENTION (PCI-S);  Surgeon: Sanda Klein, MD;  Location: [facility] CATH LAB;  Service: Cardiovascular;;   POLYPECTOMY  09/02/2020   Procedure: POLYPECTOMY;  Surgeon: Rogene Houston, MD;  [facility];  Service: Endoscopy;;   RADIAL ARTERY HARVEST Left 09/19/2016   Procedure: RADIAL ARTERY HARVEST;  Surgeon: [name], MD;  Location: [place];  Service: Open Heart Surgery;  Laterality: Left;   TEE WITHOUT CARDIOVERSION N/A 09/19/2016   Procedure: TRANSESOPHAGEAL ECHOCARDIOGRAM (TEE);  Surgeon: [name], MD;  Location: [place];  Service: Open Heart Surgery;  Laterality: N/A;    Social History  reports that he quit smoking about 4 years ago. His smoking use included cigarettes. He started smoking about 62 years ago. He has a 40.00 pack-year smoking history. He has never used smokeless tobacco. He reports that he does not drink alcohol and does not use drugs.  Allergies  Allergen Reactions   Cardizem Cd [Diltiazem Hcl Er Beads] Palpitations    "Makes heart skip"   Cardizem [Diltiazem]     Family History  Problem Relation Age of Onset   Heart attack Mother    Heart attack Brother    Colon cancer Neg Hx    Colon polyps  Neg Hx     Prior to Admission medications   Medication Sig Start Date End Date Taking? Authorizing Provider  acetaminophen (TYLENOL) 325 MG tablet Take 2 tablets (650 mg total) by mouth every 6 (six) hours as needed for mild pain. 09/23/16   Nani Skillern, PA-C  allopurinol (ZYLOPRIM) 100 MG tablet Take 100 mg by mouth daily.  05/03/19   [provider]  atorvastatin (LIPITOR) 80 MG tablet Take 80 mg by mouth daily.    [provider]  carvedilol (COREG) 3.125 MG tablet Take 1 tablet (3.125 mg total) by mouth 2 (two) times daily. 05/10/20 05/10/21  Debbe Odea, MD  clopidogrel (PLAVIX) 75 MG tablet Take 75 mg by mouth daily.    [provider]  finasteride (PROSCAR) 5 MG tablet Take 5 mg by mouth daily.    [provider]  folic acid (FOLVITE) 1 MG tablet Take 1 mg by mouth daily.    [provider]  furosemide (LASIX) 80 MG tablet Take 1 tablet (80 mg total) by mouth every Tuesday, Thursday, Saturday, and Sunday. 09/05/20   Orson Eva, MD  isosorbide mononitrate (IMDUR) 120 MG  24 hr tablet Take 1 tablet (120 mg total) by mouth daily. Patient taking differently: Take 240 mg by mouth daily. 05/18/19   Roxan Hockey, MD  levothyroxine (SYNTHROID, LEVOTHROID) 50 MCG tablet Take 50 mcg by mouth daily before breakfast.    [provider]  nitroGLYCERIN (NITROSTAT) 0.4 MG SL tablet Place 0.4 mg under the tongue every 5 (five) minutes as needed for chest pain (max 3 doses).    [provider]  Omega-3 Fatty Acids (RA FISH OIL) 1000 MG CAPS Take 2,000 mg by mouth 2 (two) times daily.    [provider]  omeprazole (PRILOSEC) 20 MG capsule Take 20 mg by mouth 2 (two) times daily. 02/14/20   [provider]  tamsulosin (FLOMAX) 0.4 MG CAPS capsule Take 0.4 mg by mouth daily.    [provider]  Vitamin D, Cholecalciferol, 25 MCG (1000 UT) CAPS Take 1,000 Units by mouth daily.    [provider]     Physical Exam: Vitals:   05/23/21 0730 05/23/21 0800 05/23/21 0840 05/23/21 0900  BP: (!) 135/55 (!) 145/54  (!) 169/81  Pulse: 84 79  94  Resp: (!) 26 (!) 28 (!) 21 (!) 28  Temp:      TempSrc:      SpO2: 95% 96% 96% 97%  Weight:      Height:        Constitutional: No fever, no nausea, no vomiting.  Patient reported no chest pain during my evaluation.  Expressed breathing slightly improved after BiPAP initiation. Vitals:   05/23/21 0730 05/23/21 0800 05/23/21 0840 05/23/21 0900  BP: (!) 135/55 (!) 145/54  (!) 169/81  Pulse: 84 79  94  Resp: (!) 26 (!) 28 (!) 21 (!) 28  Temp:      TempSrc:      SpO2: 95% 96% 96% 97%  Weight:      Height:       Eyes: PERRL, lids and conjunctivae normal, no icterus, no nystagmus. ENMT: Mucous membranes are moist. Posterior pharynx clear of any exudate or lesions. Neck: normal, supple, no masses, no thyromegaly; no JVD on exam. Respiratory: Fine crackles at the bases, positive rhonchi bilaterally; tachypnea appreciated with respiratory rate in the high 20s.  No use of accessory muscles appreciated. Cardiovascular: Sinus tachycardia, no rubs or gallops appreciated. Abdomen: no tenderness, no masses palpated. No hepatosplenomegaly. Bowel sounds positive.  Musculoskeletal: no clubbing / cyanosis. No joint deformity upper and lower extremities. Good ROM, no contractures. Normal muscle tone.  No edema appreciated. Skin: no rashes or petechiae. Neurologic: CN 2-12 grossly intact. Sensation intact, DTR normal. Strength 5/5 in all 4.  Psychiatric: Normal judgment and insight. Alert and oriented x 3. Normal mood.   Labs on Admission: I have personally reviewed following labs and imaging studies  CBC: Recent Labs  Lab 05/23/21 0217  WBC 8.1  HGB 8.3*  HCT 25.5*  MCV 98.1  PLT 125*    Basic Metabolic Panel: Recent Labs  Lab 05/23/21 0217  NA 136  K 4.4  CL 95*  CO2 29  GLUCOSE 128*  BUN 41*  CREATININE 4.90*  CALCIUM 8.8*     GFR: Estimated Creatinine Clearance: 13.2 mL/min (A) (by C-G formula based on SCr of 4.9 mg/dL (H)).  Liver Function Tests: Recent Labs  Lab 05/23/21 0217  AST 18  ALT 12  ALKPHOS 93  BILITOT 0.8  PROT 7.1  ALBUMIN 3.3*    Urine analysis:    Component Value Date/Time  COLORURINE YELLOW 02/09/2020 [place] 02/09/2020 0958   LABSPEC 1.012 02/09/2020 0958   PHURINE 5.0 02/09/2020 0958   GLUCOSEU NEGATIVE 02/09/2020 0958   HGBUR NEGATIVE 02/09/2020 0958   [place] 02/09/2020 [place] 02/09/2020 0958   PROTEINUR 100 (A) 02/09/2020 0958   NITRITE NEGATIVE 02/09/2020 0958   LEUKOCYTESUR NEGATIVE 02/09/2020 0958    Radiological Exams on Admission: DG Chest 2 View  Result Date: 05/23/2021 CLINICAL DATA:  Chest pain EXAM: CHEST - 2 VIEW COMPARISON:  04/05/2021 FINDINGS: Prior CABG. Heart is borderline in size. Bilateral perihilar and lower lobe airspace opacities. No effusions. No acute bony abnormality. IMPRESSION: Bilateral perihilar and lower lobe opacities concerning for edema. Electronically Signed   By: Rolm Baptise M.D.   On: 05/23/2021 03:06    EKG: Independently reviewed.  Sinus tachycardia, diffuse and normal repolarization changes with concern for global ischemia.  Assessment/Plan 1-NSTEMI -Continue medical management -Cardiology will see patient in consultation 05/24/2021 -Continue heparin drip for 48 hours along with nitroglycerin drip; wean off as tolerated. -Continue aspirin, statin and beta-blocker.  Holding Imdur while receiving nitroglycerin drip. -Follow electrolytes and replete as needed -Cycle troponin -Overall condition guarded -patient DNR/DNI -palliative care consulted.  2-SOB/acute hypoxemic resp failure -while maintaining saturation on 3L initially (new for him) -patient continue to be tachypnea and describing inbility to laying flat -Chest x-ray with vascular congestion and increase pulmonary  markings. -BiPAP has been initiated and nephrology service consulted with intentions to perform HD earlier than schedule/usual day for him.  3-HTN -VS stable currently -continue the use of b-blocker, also receiving NTG drip.  4-GERD -continue PPI  5-HLD -continue statin  6-anemia of chronic kidney disease -Hemoglobin 8.3 currently -IV iron and Epogen therapy as per nephrology service recommendations.  7-history of BPH -Continue Flomax.  8-hypothyroidism -Continue Synthroid.    9-ESRD -Nephrology consulted will follow recommendation for hemodialysis management. -Usual days as an outpatient on Monday, Wednesday and Friday. -Last treatment 05/21/2021   10-DNR/DNI -High risk for decompensation -Discussed with patient ongoing situation and limited intervention options. -Patient has expressed that he was condition further deteriorates he did not want to be resuscitated or placed on artificial life support. -Continue to treat aggressively; but no intubation, chest compressions or shocking.   DVT prophylaxis: Heparin drip Code Status:   DNR/DNI Family Communication:  Wife at bedside. Disposition Plan:   Patient is from:  Home  Anticipated DC to:  Home  Anticipated DC date:  To be determined  Anticipated DC barriers: Stabilization/treatment of acute NSTEMI.  Consults called:  Dr. Harrington Challenger (cardiology); Dr. Royce Macadamia (nephrology). Admission status:  Inpatient, ICU, length of stay more than 2 midnights.  Severity of Illness: The appropriate patient status for this patient is INPATIENT. Inpatient status is judged to be reasonable and necessary in order to provide the required intensity of service to ensure the patient's safety. The patient's presenting symptoms, physical exam findings, and initial radiographic and laboratory data in the context of their chronic comorbidities is felt to place them at high risk for further clinical deterioration. Furthermore, it is not anticipated that the  patient will be medically stable for discharge from the hospital within 2 midnights of admission.   * I certify that at the point of admission it is my clinical judgment that the patient will require inpatient hospital care spanning beyond 2 midnights from the point of admission due to high intensity of service, high risk for further deterioration and high frequency of  surveillance required.Barton Dubois MD [facility]  How to contact the [facility] Attending or Consulting provider [place] or covering provider during after hours [place], for this patient?   Check the care team in [facility] and look for a) attending/consulting TRH provider listed and b) the [facility] team listed Log into [email or web address] and use [place]'s universal password to access. If you do not have the password, please contact the hospital operator. Locate the [facility] provider you are looking for under [facility] and page to a number that you can be directly reached. If you still have difficulty reaching the provider, please page the [facility] (Director on Call) for the Hospitalists listed on amion for assistance.  05/23/2021, 9:17 AM

## 2021-05-23 NOTE — Consult Note (Signed)
[facility] Renal Consultation Note  Requesting MD: Jackelyn Knife, MD Indication for Consultation:  ESRD  Chief complaint: shortness of breath and chest pain  HPI:  [name] is a 76 y.o. male with a history of ESRD on HD MWF, history of bladder cancer followed by [facility], HTN, CAD s/p CABG, OSA, and chronic diastolic CHF who presented to the ER with chest pain.  He described the pain as worse with exertion and better with nitro. He was found to have NSTEMI.  He was charted as 3 liters this AM however early AM was transitioned to BIPAP and remained on bipap much of the day until he requested a break this afternoon.  Nephrology is consulted for assistance with ESRD care and dialysis.  His wife is at bedside.  He states EDW 82.2 kg he thinks.  Spoke with HD RN who is here at bedside and has taken care of him previously; she has not had issues with pulling fluid before and she states that he has sometimes signed off early.  She suggests 4 hrs treatment today and is able to do that.  They are getting him set up for HD now.  He is on nitro gtt.    PMHx:   Past Medical History:  Diagnosis Date   Acute myocardial infarction of other inferior wall, initial episode of care    Anemia    Anginal pain ([place])    Anxiety    Arthritis    Cancer ([place])    bladder 2013 removed, has cystoscopy 10/2016, has returned x 2   COPD (chronic obstructive pulmonary disease) ([place])    Coronary artery disease    Artery bypass graft Jan 2008   Dialysis patient [facility])    Dyspnea    Dysrhythmia    GERD (gastroesophageal reflux disease)    Heart murmur    History of hiatal hernia    Hyperlipidemia    Hypertension    Hypothyroidism    PONV (postoperative nausea and vomiting)    Renal disorder    Sleep apnea    Stroke [facility])    Tobacco user     Past Surgical History:  Procedure Laterality Date   APPENDECTOMY     AV FISTULA PLACEMENT Right 02/11/2020   Procedure: RIGHT ARM BRACHIOCEPHALIC  ARTERIOVENOUS (AV) FISTULA;  Surgeon: Angelia Mould, MD;  Location: [place];  Service: Vascular;  Laterality: Right;   CARDIAC CATHETERIZATION     COLONOSCOPY WITH PROPOFOL N/A 09/02/2020   Procedure: COLONOSCOPY WITH PROPOFOL;  Surgeon: Rogene Houston, MD;  [facility];  Service: Endoscopy;  Laterality: N/A;   CORONARY ARTERY BYPASS GRAFT  06/14/2006   x 5   CORONARY ARTERY BYPASS GRAFT N/A 09/19/2016   Procedure: REDO CORONARY ARTERY BYPASS GRAFTING (CABG) x two, using right internal mammary artery and left radial artery;  Surgeon: [name], MD;  Location: [place];  Service: Open Heart Surgery;  Laterality: N/A;   FOOT SURGERY     HEMOSTASIS CLIP PLACEMENT  09/02/2020   Procedure: HEMOSTASIS CLIP PLACEMENT;  Surgeon: Rogene Houston, MD;  [facility];  Service: Endoscopy;;   HERNIA REPAIR     HOT HEMOSTASIS  09/02/2020   Procedure: HOT HEMOSTASIS (ARGON PLASMA COAGULATION/BICAP);  Surgeon: Rogene Houston, MD;  [facility];  Service: Endoscopy;;   LEFT HEART CATH AND CORS/GRAFTS ANGIOGRAPHY N/A 09/14/2016   Procedure: Left Heart Cath and Cors/Grafts Angiography;  Surgeon: Troy Sine, MD;  Location: [place] CV LAB;  Service: Cardiovascular;  Laterality: N/A;   LEFT HEART CATHETERIZATION WITH CORONARY/GRAFT ANGIOGRAM N/A 01/31/2014   Procedure: LEFT HEART CATHETERIZATION WITH Beatrix Fetters;  Surgeon: Sanda Klein, MD;  Location: [place] CATH LAB;  Service: Cardiovascular;  Laterality: N/A;   NOSE SURGERY     PERCUTANEOUS CORONARY STENT INTERVENTION (PCI-S)  01/31/2014   Procedure: PERCUTANEOUS CORONARY STENT INTERVENTION (PCI-S);  Surgeon: Sanda Klein, MD;  Location: [facility] CATH LAB;  Service: Cardiovascular;;   POLYPECTOMY  09/02/2020   Procedure: POLYPECTOMY;  Surgeon: Rogene Houston, MD;  [facility];  Service: Endoscopy;;   RADIAL ARTERY HARVEST Left 09/19/2016   Procedure: RADIAL ARTERY HARVEST;  Surgeon: [name], MD;  Location: [place];  Service: Open Heart Surgery;  Laterality: Left;   TEE WITHOUT CARDIOVERSION N/A 09/19/2016   Procedure: TRANSESOPHAGEAL ECHOCARDIOGRAM (TEE);  Surgeon: [name], MD;  Location: [place];  Service: Open Heart Surgery;  Laterality: N/A;    Family Hx:  Family History  Problem Relation Age of Onset   Heart attack Mother    Heart attack Brother    Colon cancer Neg Hx    Colon polyps Neg Hx     Social History:  reports that he quit smoking about 4 years ago. His smoking use included cigarettes. He started smoking about 62 years ago. He has a 40.00 pack-year smoking history. He has never used smokeless tobacco. He reports that he does not drink alcohol and does not use drugs.  Allergies:  Allergies  Allergen Reactions   Cardizem Cd [Diltiazem Hcl Er Beads] Palpitations    "Makes heart skip"   Cardizem [Diltiazem]     Medications: Prior to Admission medications   Medication Sig Start Date End Date Taking? Authorizing Provider  acetaminophen (TYLENOL) 325 MG tablet Take 2 tablets (650 mg total) by mouth every 6 (six) hours as needed for mild pain. 09/23/16  Yes Lars Pinks M, PA-C  allopurinol (ZYLOPRIM) 100 MG tablet Take 100 mg by mouth daily.  05/03/19  Yes [provider]  atorvastatin (LIPITOR) 80 MG tablet Take 80 mg by mouth daily.   Yes [provider]  carvedilol (COREG) 3.125 MG tablet Take 1 tablet (3.125 mg total) by mouth 2 (two) times daily. 05/10/20 05/23/21 Yes Debbe Odea, MD  clopidogrel (PLAVIX) 75 MG tablet Take 75 mg by mouth daily.   Yes [provider]  finasteride (PROSCAR) 5 MG tablet Take 5 mg by mouth daily.   Yes [provider]  folic acid (FOLVITE) 1 MG tablet Take 1 mg by mouth daily.   Yes [provider]  furosemide (LASIX) 80 MG tablet Take 1 tablet (80 mg total) by mouth every Tuesday, Thursday, Saturday, and Sunday. 09/05/20  Yes Tat, Shanon Brow, MD  isosorbide mononitrate (IMDUR)  120 MG 24 hr tablet Take 1 tablet (120 mg total) by mouth daily. 05/18/19  Yes Emokpae, Courage, MD  levothyroxine (SYNTHROID, LEVOTHROID) 50 MCG tablet Take 50 mcg by mouth daily before breakfast.   Yes [provider]  nitroGLYCERIN (NITROSTAT) 0.4 MG SL tablet Place 0.4 mg under the tongue every 5 (five) minutes as needed for chest pain (max 3 doses).   Yes [provider]  Omega-3 Fatty Acids (RA FISH OIL) 1000 MG CAPS Take 2,000 mg by mouth 2 (two) times daily.   Yes [provider]  omeprazole (PRILOSEC) 40 MG capsule Take 20 mg by mouth daily. 02/14/20  Yes [provider]  tamsulosin (FLOMAX) 0.4 MG CAPS capsule Take 0.4 mg by mouth daily.   Yes [provider]  Vitamin D, Cholecalciferol, 25 MCG (1000 UT) CAPS Take 1,000 Units by mouth daily. Patient not taking: Reported on 05/23/2021    [provider]    I have reviewed the patient's current and reported prior to admission medications.  Labs:  BMP Latest Ref Rng & Units 05/23/2021 05/12/2021 05/11/2021  Glucose 70 - 99 mg/dL 128(H) 147(H) 102(H)  BUN 8 - 23 mg/dL 41(H) 49(H) 37(H)  Creatinine 0.61 - 1.24 mg/dL 4.90(H) 5.73(H) 4.43(H)  Sodium 135 - 145 mmol/L 136 133(L) 134(L)  Potassium 3.5 - 5.1 mmol/L 4.4 4.5 4.4  Chloride 98 - 111 mmol/L 95(L) 98 96(L)  CO2 22 - 32 mmol/L 29 25 29   Calcium 8.9 - 10.3 mg/dL 8.8(L) 8.8(L) 8.8(L)    ROS:  Pertinent items noted in HPI and remainder of comprehensive ROS otherwise negative.  Physical Exam: Vitals:   05/23/21 1500 05/23/21 1600  BP: (!) 165/72 (!) 184/60  Pulse: (!) 108 (!) 108  Resp: (!) 32 (!) 26  Temp:    SpO2: 94% 98%     General: elderly male in chair on BIPAP HEENT: NCAT Eyes: EOMI sclera anicteric Neck: supple trachea midline Heart: S1S2 tachy no rub Lungs: obscured with bipap; increased work of breathing with speech  Abdomen: softly dist/obese /nt Extremities: 2+ edema no cyanosis or clubbing Skin: no rash on  extremities exposed Neuro: alert and oriented x 3 provides hx and follows commands Psych normal mood and affect Access RUE AVF bruit and thrill   Outpatient HD unit [facility] MWF No rx available  Assessment/Plan:  # NSTEMI  - per cardiology and primary team - on beta blocker and nitro and heparin infusing  # ESRD - HD today; normally is MWF schedule - would do next tx on Tuesday off schedule - will need to call outpatient unit for updated orders and meds  # Acute hypoxic resp failure - on BIPAP previously - optimize volume with HD   # HTN  - optimize volume status with HD  - on coreg - on nitro gtt  # Anemia CKD  - will need to call his HD unit for meds; no ESA right now given acute NSTEMI  # Metabolic bone disease  - will need to call his outpatient HD unit for ESRD meds - no binder on med list  - renal panel is ordered in am  Claudia Desanctis 05/23/2021, 5:53 PM

## 2021-05-23 NOTE — ED Notes (Addendum)
Pt currently on bipap at this time. This nurse asked hospitalist if meds needed to be held or given. Hospitalist also notified on pts RR.

## 2021-05-23 NOTE — Procedures (Signed)
° °  HEMODIALYSIS TREATMENT NOTE:   Pre-HD pt is sitting upright in the recliner, saturating 100% on BiPAP.  179/60  MAP 101 with NTG infusing at 15 mcg/min.  Denies chest pain but endorses dyspnea when reclined. Bibasilar rales auscultated.  Agrees to try for a longer session to maximize UF goal of 3.5 - 4L.  HD was initiated using right upper arm AVF (15g/antegrade).  He remained free of chest pain as NTG was weaned off in response to declining BP.  BiPAP was removed after 1.5 hours of therapy and sats remained 100% on 4L O2 via [place].  He was able to tolerate 3.75 hours of HD before asking to end session 15 minutes early. Goal met: 3.9 liters removed without interruption in UF.  All blood was returned.   Rockwell Alexandria, RN

## 2021-05-23 NOTE — ED Provider Notes (Signed)
[facility] EMERGENCY DEPARTMENT Provider Note   CSN: [id number] Arrival date & time: 05/23/21  0154     History  Chief Complaint  Patient presents with   Heartburn    [name] is a 76 y.o. male.  Patient presents to the emergency department for evaluation of "heartburn".  Symptoms began around 5 or 6 PM and have been persistent.  He reports some mild sweating, no nausea or shortness of breath associated with symptoms.  Patient does report a history of "gallbladder problems" with similar symptoms.   HPI: A 76 year old patient with a history of hypertension and hypercholesterolemia presents for evaluation of chest pain. Initial onset of pain was more than 6 hours ago. The patient's chest pain is described as heaviness/pressure/tightness, is not worse with exertion and is relieved by nitroglycerin. The patient reports some diaphoresis. The patient's chest pain is middle- or left-sided, is not well-localized, is not sharp and does not radiate to the arms/jaw/neck. The patient does not complain of nausea. The patient has no history of stroke, has no history of peripheral artery disease, has not smoked in the past 90 days, denies any history of treated diabetes, has no relevant family history of coronary artery disease (first degree relative at less than age 39) and does not have an elevated BMI (>=30).   Home Medications Prior to Admission medications   Medication Sig Start Date End Date Taking? Authorizing Provider  acetaminophen (TYLENOL) 325 MG tablet Take 2 tablets (650 mg total) by mouth every 6 (six) hours as needed for mild pain. 09/23/16   Nani Skillern, PA-C  allopurinol (ZYLOPRIM) 100 MG tablet Take 100 mg by mouth daily.  05/03/19   [provider]  atorvastatin (LIPITOR) 80 MG tablet Take 80 mg by mouth daily.    [provider]  carvedilol (COREG) 3.125 MG tablet Take 1 tablet (3.125 mg total) by mouth 2 (two) times daily. 05/10/20 05/10/21  Debbe Odea, MD  clopidogrel (PLAVIX) 75 MG tablet Take 75 mg by mouth daily.    [provider]  finasteride (PROSCAR) 5 MG tablet Take 5 mg by mouth daily.    [provider]  folic acid (FOLVITE) 1 MG tablet Take 1 mg by mouth daily.    [provider]  furosemide (LASIX) 80 MG tablet Take 1 tablet (80 mg total) by mouth every Tuesday, Thursday, Saturday, and Sunday. 09/05/20   Orson Eva, MD  isosorbide mononitrate (IMDUR) 120 MG 24 hr tablet Take 1 tablet (120 mg total) by mouth daily. Patient taking differently: Take 240 mg by mouth daily. 05/18/19   Roxan Hockey, MD  levothyroxine (SYNTHROID, LEVOTHROID) 50 MCG tablet Take 50 mcg by mouth daily before breakfast.    [provider]  nitroGLYCERIN (NITROSTAT) 0.4 MG SL tablet Place 0.4 mg under the tongue every 5 (five) minutes as needed for chest pain (max 3 doses).    [provider]  Omega-3 Fatty Acids (RA FISH OIL) 1000 MG CAPS Take 2,000 mg by mouth 2 (two) times daily.    [provider]  omeprazole (PRILOSEC) 20 MG capsule Take 20 mg by mouth 2 (two) times daily. 02/14/20   [provider]  tamsulosin (FLOMAX) 0.4 MG CAPS capsule Take 0.4 mg by mouth daily.    [provider]  Vitamin D, Cholecalciferol, 25 MCG (1000 UT) CAPS Take 1,000 Units by mouth daily.    [provider]      Allergies    Cardizem cd [  diltiazem hcl er beads] and Cardizem [diltiazem]    Review of Systems   Review of Systems  Physical Exam Updated Vital Signs BP (!) 137/54    Pulse 92    Temp 98 F (36.7 C) (Oral)    Resp 11    Ht 5\' 9"  (1.753 m)    Wt 83.5 kg    SpO2 96%    BMI 27.17 kg/m  Physical Exam Vitals and nursing note reviewed.  Constitutional:      General: He is not in acute distress.    Appearance: Normal appearance. He is well-developed.  HENT:     Head: Normocephalic and atraumatic.     Right Ear: Hearing normal.     Left Ear: Hearing normal.     Nose: Nose  normal.  Eyes:     Conjunctiva/sclera: Conjunctivae normal.     Pupils: Pupils are equal, round, and reactive to light.  Cardiovascular:     Rate and Rhythm: Regular rhythm.     Heart sounds: S1 normal and S2 normal. No murmur heard.   No friction rub. No gallop.  Pulmonary:     Effort: Pulmonary effort is normal. Tachypnea present. No respiratory distress.     Breath sounds: Normal breath sounds.  Chest:     Chest wall: No tenderness.  Abdominal:     General: Bowel sounds are normal.     Palpations: Abdomen is soft.     Tenderness: There is no abdominal tenderness. There is no guarding or rebound. Negative signs include Murphy's sign and McBurney's sign.     Hernia: No hernia is present.  Musculoskeletal:        General: Normal range of motion.     Cervical back: Normal range of motion and neck supple.  Skin:    General: Skin is warm and dry.     Findings: No rash.  Neurological:     Mental Status: He is alert and oriented to person, place, and time.     GCS: GCS eye subscore is 4. GCS verbal subscore is 5. GCS motor subscore is 6.     Cranial Nerves: No cranial nerve deficit.     Sensory: No sensory deficit.     Coordination: Coordination normal.  Psychiatric:        Speech: Speech normal.        Behavior: Behavior normal.        Thought Content: Thought content normal.    ED Results / Procedures / Treatments   Labs (all labs ordered are listed, but only abnormal results are displayed) Labs Reviewed  BASIC METABOLIC PANEL - Abnormal; Notable for the following components:      Result Value   Chloride 95 (*)    Glucose, Bld 128 (*)    BUN 41 (*)    Creatinine, Ser 4.90 (*)    Calcium 8.8 (*)    GFR, Estimated 12 (*)    All other components within normal limits  CBC - Abnormal; Notable for the following components:   RBC 2.60 (*)    Hemoglobin 8.3 (*)    HCT 25.5 (*)    Platelets 125 (*)    All other components within normal limits  HEPATIC FUNCTION PANEL -  Abnormal; Notable for the following components:   Albumin 3.3 (*)    All other components within normal limits  TROPONIN I (HIGH SENSITIVITY) - Abnormal; Notable for the following components:   Troponin I (High Sensitivity) 505 (*)    All  other components within normal limits  RESP PANEL BY RT-PCR (FLU A&B, COVID) ARPGX2  LIPASE, BLOOD  BRAIN NATRIURETIC PEPTIDE  HEPARIN LEVEL (UNFRACTIONATED)    EKG EKG Interpretation  Date/Time:  Sunday May 23 2021 02:03:02 EST Ventricular Rate:  101 PR Interval:  155 QRS Duration: 96 QT Interval:  367 QTC Calculation: 476 R Axis:   42 Text Interpretation: Sinus tachycardia Atrial premature complexes Repol abnrm, severe global ischemia (LM/MVD) , new since december Confirmed by Orpah Greek [phone]) on 05/23/2021 2:57:59 AM  Radiology DG Chest 2 View  Result Date: 05/23/2021 CLINICAL DATA:  Chest pain EXAM: CHEST - 2 VIEW COMPARISON:  04/05/2021 FINDINGS: Prior CABG. Heart is borderline in size. Bilateral perihilar and lower lobe airspace opacities. No effusions. No acute bony abnormality. IMPRESSION: Bilateral perihilar and lower lobe opacities concerning for edema. Electronically Signed   By: Rolm Baptise M.D.   On: 05/23/2021 03:06    Procedures Procedures    Medications Ordered in ED Medications  nitroGLYCERIN (NITROSTAT) SL tablet 0.4 mg (0.4 mg Sublingual Given 05/23/21 0313)  nitroGLYCERIN 50 mg in dextrose 5 % 250 mL (0.2 mg/mL) infusion (5 mcg/min Intravenous New Bag/Given 05/23/21 0328)  heparin bolus via infusion 3,000 Units (has no administration in time range)  heparin ADULT infusion 100 units/mL (25000 units/259mL) (has no administration in time range)  aspirin chewable tablet 324 mg (324 mg Oral Given 05/23/21 0216)    ED Course/ Medical Decision Making/ A&P   HEAR Score: 45                       Medical Decision Making  76 year old male with previous history of coronary artery disease, status post bypass in 2008,  redo bypass in 2018 presents to the emergency department with what he thinks is "heartburn".  He says he has had similar symptoms in the past and was told it was his gallbladder.  EKG at arrival, however, reveals global ischemia, new since EKG performed in December.  Current presentation is concerning for acute coronary syndrome.  No ST elevations noted.  Patient given aspirin and nitroglycerin, pain resolved but then came back fairly quickly.  First troponin elevated at 505.  Patient started on heparin drip, nitro drip.  Patient exhibits sinus rhythm on cardiac monitor.  Chest x-ray was independently interpreted by myself.  There is cardiomegaly and by lateral opacities concerning for congestive heart failure.  Reviewing his echo from November, patient's ejection fraction is 55 to 60%.  Patient is a dialysis patient.  He does, however, still making urine.  He takes Lasix on the days that he does not have dialysis.  We will give 80 mg of Lasix IV.  Discussed with Dr. Humphrey Rolls, on-call for cardiology at [facility].  Agrees with current treatment plan and recommends admission to [facility].  Bed request placed.  I suspect there will be a delay in obtaining a bed, home meds were also ordered.  CRITICAL CARE Performed by: Orpah Greek   Total critical care time: 36 minutes  Critical care time was exclusive of separately billable procedures and treating other patients.  Critical care was necessary to treat or prevent imminent or life-threatening deterioration.  Critical care was time spent personally by me on the following activities: development of treatment plan with patient and/or surrogate as well as nursing, discussions with consultants, evaluation of patient's response to treatment, examination of patient, obtaining history from patient or surrogate, ordering and performing treatments and interventions,  ordering and review of laboratory studies, ordering and review of radiographic studies,  pulse oximetry and re-evaluation of patient's condition.         Final Clinical Impression(s) / ED Diagnoses Final diagnoses:  NSTEMI (non-ST elevated myocardial infarction) [facility])    Rx / DC Orders ED Discharge Orders     None         Yoali Conry, Gwenyth Allegra, MD 05/23/21 [phone]

## 2021-05-23 NOTE — Progress Notes (Signed)
ANTICOAGULATION CONSULT NOTE - Initial Consult  Pharmacy Consult for heparin Indication: chest pain/ACS  Allergies  Allergen Reactions   Cardizem Cd [Diltiazem Hcl Er Beads] Palpitations    "Makes heart skip"   Cardizem [Diltiazem]     Patient Measurements: Height: 5\' 9"  (175.3 cm) Weight: 83.5 kg (184 lb) IBW/kg (Calculated) : 70.7  Vital Signs: Temp: 98 F (36.7 C) (01/08 0213) Temp Source: Oral (01/08 0213) BP: 160/73 (01/08 1239) Pulse Rate: 100 (01/08 1239)  Labs: Recent Labs    05/23/21 0217 05/23/21 0417 05/23/21 0723 05/23/21 1147  HGB 8.3*  --   --   --   HCT 25.5*  --   --   --   PLT 125*  --   --   --   HEPARINUNFRC  --   --   --  0.30  CREATININE 4.90*  --   --   --   TROPONINIHS 505* 1,361* 3,951*  --      Estimated Creatinine Clearance: 13.2 mL/min (A) (by C-G formula based on SCr of 4.9 mg/dL (H)).   Medical History: Past Medical History:  Diagnosis Date   Acute myocardial infarction of other inferior wall, initial episode of care    Anemia    Anginal pain ([place])    Anxiety    Arthritis    Cancer ([place])    bladder 2013 removed, has cystoscopy 10/2016, has returned x 2   COPD (chronic obstructive pulmonary disease) ([place])    Coronary artery disease    Artery bypass graft Jan 2008   Dialysis patient [facility])    Dyspnea    Dysrhythmia    GERD (gastroesophageal reflux disease)    Heart murmur    History of hiatal hernia    Hyperlipidemia    Hypertension    Hypothyroidism    PONV (postoperative nausea and vomiting)    Renal disorder    Sleep apnea    Stroke ([place])    Tobacco user     Assessment: 76yo male c/o "heartburn" associated with diaphoresis but on arrival to ED ECG reveals global ischemia >> troponin elevated >> to begin heparin.  HL 0.3, therapeutic on low end, no issues with infusion Patient transferring to [facility] to be evaluated by Cardiology  Goal of Therapy:  Heparin level 0.3-0.7 units/ml Monitor platelets by anticoagulation  protocol: Yes   Plan:  Increase heparin infusion to 1300 units/hr. Monitor heparin levels and CBC.  Isac Sarna, BS Vena Austria, BCPS Clinical Pharmacist Pager [phone] 05/23/2021,1:00 PM

## 2021-05-23 NOTE — Progress Notes (Signed)
1113 The patient requested a respite from the BiPAP (he was transported without the BiPAP and maintained  a satisfactory SpO2.) He was placed on 5 L/m [place] with humidification and has an SpO2 of 96%.

## 2021-05-24 ENCOUNTER — Inpatient Hospital Stay (HOSPITAL_COMMUNITY): Payer: No Typology Code available for payment source

## 2021-05-24 DIAGNOSIS — I214 Non-ST elevation (NSTEMI) myocardial infarction: Secondary | ICD-10-CM

## 2021-05-24 LAB — RENAL FUNCTION PANEL
Albumin: 3.2 g/dL — ABNORMAL LOW (ref 3.5–5.0)
Anion gap: 10 (ref 5–15)
BUN: 20 mg/dL (ref 8–23)
CO2: 30 mmol/L (ref 22–32)
Calcium: 8.9 mg/dL (ref 8.9–10.3)
Chloride: 96 mmol/L — ABNORMAL LOW (ref 98–111)
Creatinine, Ser: 2.95 mg/dL — ABNORMAL HIGH (ref 0.61–1.24)
GFR, Estimated: 22 mL/min — ABNORMAL LOW (ref >60)
Glucose, Bld: 109 mg/dL — ABNORMAL HIGH (ref 70–99)
Phosphorus: 1.8 mg/dL — ABNORMAL LOW (ref 2.5–4.6)
Potassium: 3.5 mmol/L (ref 3.5–5.1)
Sodium: 136 mmol/L (ref 135–145)

## 2021-05-24 LAB — ECHOCARDIOGRAM COMPLETE
AR max vel: 1 cm2
AV Area VTI: 0.9 cm2
AV Area mean vel: 0.96 cm2
AV Mean grad: 23.5 mmHg
AV Peak grad: 41.9 mmHg
Ao pk vel: 3.24 m/s
Area-P 1/2: 4.49 cm2
Calc EF: 37.9 %
Height: 69 in
P 1/2 time: 230 msec
S' Lateral: 4.2 cm
Single Plane A2C EF: 38.1 %
Single Plane A4C EF: 39.1 %
Weight: 2910.07 oz

## 2021-05-24 LAB — HEPARIN LEVEL (UNFRACTIONATED)
Heparin Unfractionated: 0.25 IU/mL — ABNORMAL LOW (ref 0.30–0.70)
Heparin Unfractionated: 0.28 IU/mL — ABNORMAL LOW (ref 0.30–0.70)

## 2021-05-24 LAB — CBC
HCT: 25.3 % — ABNORMAL LOW (ref 39.0–52.0)
Hemoglobin: 8.1 g/dL — ABNORMAL LOW (ref 13.0–17.0)
MCH: 30.5 pg (ref 26.0–34.0)
MCHC: 32 g/dL (ref 30.0–36.0)
MCV: 95.1 fL (ref 80.0–100.0)
Platelets: 138 10*3/uL — ABNORMAL LOW (ref 150–400)
RBC: 2.66 MIL/uL — ABNORMAL LOW (ref 4.22–5.81)
RDW: 14.9 % (ref 11.5–15.5)
WBC: 8.6 10*3/uL (ref 4.0–10.5)
nRBC: 0 % (ref 0.0–0.2)

## 2021-05-24 LAB — TROPONIN I (HIGH SENSITIVITY)
Troponin I (High Sensitivity): 14741 ng/L (ref <18)
Troponin I (High Sensitivity): 12806 ng/L (ref <18)

## 2021-05-24 MED ORDER — CHLORHEXIDINE GLUCONATE CLOTH 2 % EX PADS
6.0000 | MEDICATED_PAD | Freq: Every day | CUTANEOUS | Status: DC
  Administered 2021-05-25 – 2021-05-29 (×5): 6 via TOPICAL

## 2021-05-24 MED ORDER — ALUM & MAG HYDROXIDE-SIMETH 200-200-20 MG/5ML PO SUSP
30.0000 mL | Freq: Once | ORAL | Status: AC
  Administered 2021-05-25: 30 mL via ORAL
  Filled 2021-05-24: qty 30

## 2021-05-24 MED ORDER — HEPARIN BOLUS VIA INFUSION
1000.0000 [IU] | Freq: Once | INTRAVENOUS | Status: AC
  Administered 2021-05-24: 1000 [IU] via INTRAVENOUS
  Filled 2021-05-24: qty 1000

## 2021-05-24 NOTE — Progress Notes (Signed)
PROGRESS NOTE    [name]  SPQ:[id number] DOB: 1945/09/18 DOA: 05/23/2021 PCP: [facility]    Chief Complaint  Patient presents with   Chest Pain    Brief Narrative:  [name] is a 76 y.o. male with medical history significant of end-stage renal disease on hemodialysis (Monday, Wednesday, Friday); history of bladder cancer diagnosed in 2013 actively followed by urologist at [facility] (last visit documented on epic suggesting remission), history of BPH, hypertension, hyperlipidemia, cholelithiasis/biliary colic, hypothyroidism, gastroesophageal flux disease, history of coronary artery disease status post CABG and chronic diastolic heart failure; who presented to the hospital secondary to chest pain and heartburn. Pain is localized in the mid chest/epigastric region, non-radiated, 8/10 in intensity, constant and associated with sweating/diaphoresis and SOB. Patient also reported that the pain is worse with exertion and got relieved by the use of nitroglycerin.   Patient is now vaccinated against COVID; COVID PCR in the ER negative.   ED Course: Work-up has demonstrated significant ascending elevation in his troponin ([id number]), chest x-ray demonstrating vascular congestion and increased lung bases markings, no acute infiltrates; patient is afebrile, with a stable blood pressure and normal WBCs.  EKG demonstrated sinus tachycardia with abnormal repolarization and concerns for global ischemia. (New findings since December).  Case was discussed with cardiology service on call who recommended initiation of heparin drip as part of treatment for NSTEMI.  Further discussion with on-call cardiology today's morning (Dr. Harrington Challenger) has trigger the patient is not a candidate for cardiac cath and to focus on medical management.  As he was a still experiencing discomfort nitroglycerin drip was also started and to alleviate complaining of shortness of breath patient was placed on BiPAP.   TRH has been consulted to place in the hospital for further evaluation and management.  Assessment & Plan: 1-NSTEMI -Continue medical management -So far tolerating well Heparin drip; currently denies chest pain. -Plan is for 48 hours of heparin drip; patient is chest pain-free and no complaining of shortness of breath at this time nitroglycerin drip has been discontinued. -Continue aspirin, statin and beta-blocker. -Will follow any further recommendation by cardiology service.  2-acute respiratory failure with hypoxia/shortness of breath -In the setting of pulmonary edema/vascular congestion. -Status post emergent hemodialysis on 05/23/2021; currently no requiring oxygen supplementation or BiPAP of initially needed. -Continue follow-up recommendations by nephrology service for further adjustments in his dry weight and volume control. -3.9 L removed with dialysis treatment yesterday. -Continue treatment for NSTEMI as mentioned in problem 1.  3-hypertension -Stable below -Continue current antihypertensive agents.  4-gastroesophageal flux disease -Continue PPI.  5-anemia of chronic kidney disease -Hemoglobin around 8.1-8.3 currently; no signs of apparent bleeding -IV iron and Epogen therapy as per nephrology service recommendations.  Threshold for transfusion given history of coronary artery disease is hemoglobin less than 7.5.  6-history of BPH -Continue Flomax.  7-hypothyroidism -Continue Synthroid.  8-end-stage renal disease -Continue to follow nephrology recommendations -Emergent hemodialysis on 05/23/2021; plan is for dialysis again on 05/25/2021 and 05/26/2021 in order to get him back on schedule. -Will follow further advice regarding adjustment on his dry weight.  9-DNR/DNI -Patient with significant comorbidities and high risk for decompensation -Extensive discussion with patient and wife at bedside; he understand his situation and has confusion and DNR/DNI status; continue  aggressive treatment but no intubation, chest compressions or shocking. -Okay to use BiPAP if needed.  DVT prophylaxis: Heparin drip. Code Status: DNR/DNI Family Communication: Wife at bedside. Disposition:   Status is: Inpatient;  given NSTEMI status and need for IV heparin drip.  Stable to transfer to telemetry bed.    Consultants:  Nephrology service Cardiology service Palliative care.  Procedures:  See below for x-ray reports Emergent hemodialysis on 05/23/2021. 2D echo: Ordered/pending.   Antimicrobials:  None   Subjective: Afebrile, reports no chest pain, abdominal pain, nausea, vomiting or palpitations.  Patient is currently lying on bed without oxygen supplementation and expressing significant improvement in his breathing.  Objective: Vitals:   05/24/21 1300 05/24/21 1400 05/24/21 1500 05/24/21 1638  BP: (!) 136/35 (!) 109/21    Pulse:   76   Resp: (!) 21 (!) 24 (!) 24   Temp:    (!) 97.5 F (36.4 C)  TempSrc:    Oral  SpO2:   100%   Weight:      Height:        Intake/Output Summary (Last 24 hours) at 05/24/2021 1802 Last data filed at 05/24/2021 1147 Gross per 24 hour  Intake 792.79 ml  Output 3900 ml  Net -3107.21 ml   Filed Weights   05/23/21 0159 05/23/21 1740 05/24/21 0500  Weight: 83.5 kg 83.5 kg 82.5 kg    Examination:  General exam: Appears calm and comfortable; no CP currently and off oxygen at rest. Denies nausea, vomiting and palpitations. Respiratory system: Decreased breath sounds at the bases, positive rhonchi, no using accessory muscle.  No tachypnea Cardiovascular system: Systolic ejection murmur, no rubs, no gallops, S1 and S2 appreciated exam. Gastrointestinal system: Abdomen is nondistended, soft and nontender. No organomegaly or masses felt. Normal bowel sounds heard. Central nervous system: Alert and oriented. No focal neurological deficits. Extremities: Cyanosis or clubbing.  Trace pedal edema bilaterally appreciated. Skin: No  petechiae. Psychiatry: Judgement and insight appear normal. Mood & affect appropriate.     Data Reviewed: I have personally reviewed following labs and imaging studies  CBC: Recent Labs  Lab 05/23/21 0217 05/24/21 0116  WBC 8.1 8.6  HGB 8.3* 8.1*  HCT 25.5* 25.3*  MCV 98.1 95.1  PLT 125* 138*    Basic Metabolic Panel: Recent Labs  Lab 05/23/21 0217 05/24/21 0116  NA 136 136  K 4.4 3.5  CL 95* 96*  CO2 29 30  GLUCOSE 128* 109*  BUN 41* 20  CREATININE 4.90* 2.95*  CALCIUM 8.8* 8.9  PHOS  --  1.8*    GFR: Estimated Creatinine Clearance: 22 mL/min (A) (by C-G formula based on SCr of 2.95 mg/dL (H)).  Liver Function Tests: Recent Labs  Lab 05/23/21 0217 05/24/21 0116  AST 18  --   ALT 12  --   ALKPHOS 93  --   BILITOT 0.8  --   PROT 7.1  --   ALBUMIN 3.3* 3.2*    CBG: No results for input(s): GLUCAP in the last 168 hours.   Recent Results (from the past 240 hour(s))  Resp Panel by RT-PCR (Flu A&B, Covid) Nasopharyngeal Swab     Status: None   Collection Time: 05/23/21  4:12 AM   Specimen: Nasopharyngeal Swab; Nasopharyngeal(NP) swabs in vial transport medium  Result Value Ref Range Status   SARS Coronavirus 2 by RT PCR NEGATIVE NEGATIVE Final    Comment: (NOTE) SARS-CoV-2 target nucleic acids are NOT DETECTED.  The SARS-CoV-2 RNA is generally detectable in upper respiratory specimens during the acute phase of infection. The lowest concentration of SARS-CoV-2 viral copies this assay can detect is 138 copies/mL. A negative result does not preclude SARS-Cov-2 infection and should not be  used as the sole basis for treatment or other patient management decisions. A negative result may occur with  improper specimen collection/handling, submission of specimen other than nasopharyngeal swab, presence of viral mutation(s) within the areas targeted by this assay, and inadequate number of viral copies(<138 copies/mL). A negative result must be combined  with clinical observations, patient history, and epidemiological information. The expected result is Negative.  Fact Sheet for Patients:  EntrepreneurPulse.com.au  Fact Sheet for Healthcare Providers:  IncredibleEmployment.be  This test is no t yet approved or cleared by the [place] FDA and  has been authorized for detection and/or diagnosis of SARS-CoV-2 by FDA under an Emergency Use Authorization (EUA). This EUA will remain  in effect (meaning this test can be used) for the duration of the COVID-19 declaration under Section 564(b)(1) of the Act, 21 U.S.C.section 360bbb-3(b)(1), unless the authorization is terminated  or revoked sooner.       Influenza A by PCR NEGATIVE NEGATIVE Final   Influenza B by PCR NEGATIVE NEGATIVE Final    Comment: (NOTE) The Xpert Xpress SARS-CoV-2/FLU/RSV plus assay is intended as an aid in the diagnosis of influenza from Nasopharyngeal swab specimens and should not be used as a sole basis for treatment. Nasal washings and aspirates are unacceptable for Xpert Xpress SARS-CoV-2/FLU/RSV testing.  Fact Sheet for Patients: EntrepreneurPulse.com.au  Fact Sheet for Healthcare Providers: IncredibleEmployment.be  This test is not yet approved or cleared by the [place] FDA and has been authorized for detection and/or diagnosis of SARS-CoV-2 by FDA under an Emergency Use Authorization (EUA). This EUA will remain in effect (meaning this test can be used) for the duration of the COVID-19 declaration under Section 564(b)(1) of the Act, 21 U.S.C. section 360bbb-3(b)(1), unless the authorization is terminated or revoked.  Performed at [facility], [address], [place], [place] [address]   MRSA Next Gen by PCR, Nasal     Status: None   Collection Time: 05/23/21  1:06 PM   Specimen: Nasal Mucosa; Nasal Swab  Result Value Ref Range Status   MRSA by PCR Next Gen NOT DETECTED  NOT DETECTED Final    Comment: (NOTE) The GeneXpert MRSA Assay (FDA approved for NASAL specimens only), is one component of a comprehensive MRSA colonization surveillance program. It is not intended to diagnose MRSA infection nor to guide or monitor treatment for MRSA infections. Test performance is not FDA approved in patients less than 60 years old. Performed at [facility], [address], [place], [place] [address]      Radiology Studies: DG Chest 2 View  Result Date: 05/23/2021 CLINICAL DATA:  Chest pain EXAM: CHEST - 2 VIEW COMPARISON:  04/05/2021 FINDINGS: Prior CABG. Heart is borderline in size. Bilateral perihilar and lower lobe airspace opacities. No effusions. No acute bony abnormality. IMPRESSION: Bilateral perihilar and lower lobe opacities concerning for edema. Electronically Signed   By: Rolm Baptise M.D.   On: 05/23/2021 03:06   DG CHEST PORT 1 VIEW  Result Date: 05/24/2021 CLINICAL DATA:  76 year old male with history of end-stage renal disease and bladder cancer presenting with shortness of breath. EXAM: PORTABLE CHEST 1 VIEW COMPARISON:  Chest x-ray 05/23/2021. FINDINGS: Lung volumes are low. No acute consolidative airspace disease. Trace right pleural effusion. No left pleural effusion. No pneumothorax. Mild diffuse peribronchial cuffing and interstitial prominence. Cephalization of the pulmonary vasculature noted on the prior examination has resolved. Heart size is upper limits of normal. Upper mediastinal contours are within normal limits. Atherosclerotic calcifications are noted  in the thoracic aorta. Status post median sternotomy for CABG. IMPRESSION: 1. The appearance of the chest suggests resolving pulmonary edema, as above. 2. Trace right pleural effusion. 3. Aortic atherosclerosis. Electronically Signed   By: Vinnie Langton M.D.   On: 05/24/2021 05:24   ECHOCARDIOGRAM COMPLETE  Result Date: 05/24/2021    ECHOCARDIOGRAM REPORT   Patient Name:   [name] Date of  Exam: 05/24/2021 Medical Rec #:  [id number]      Height:       69.0 in Accession #:    [id number]     Weight:       181.9 lb Date of Birth:  January 01, 1946      BSA:          1.984 m Patient Age:    46 years       BP:           141/28 mmHg Patient Gender: M              HR:           76 bpm. Exam Location:  Inpatient Procedure: 2D Echo, 3D Echo, Cardiac Doppler and Color Doppler Indications:    NSTEMI I21.4  History:        Patient has prior history of Echocardiogram examinations, most                 recent 04/06/2021. Previous Myocardial Infarction and CAD, Prior                 CABG, COPD, Aortic Valve Disease; Risk Factors:Dyslipidemia and                 Hypertension.  Sonographer:    Merrie Roof RDCS Referring Phys: [place]  1. Inferolateral hypokinesis.. Left ventricular ejection fraction, by estimation, is 45%%. The left ventricle has mildly decreased function. The left ventricular internal cavity size was moderately dilated. There is mild left ventricular hypertrophy. Left ventricular diastolic parameters are consistent with Grade II diastolic dysfunction (pseudonormalization). Elevated left atrial pressure.  2. Right ventricular systolic function is normal. The right ventricular size is normal. There is moderately elevated pulmonary artery systolic pressure.  3. Left atrial size was moderately dilated.  4. Right atrial size was mildly dilated.  5. Mild to moderate mitral valve regurgitation.  6. AV is thickened, calcified with restricted motion. Peak and mean gradients through the valve are 41 and 24 mm Hg respectively. AVA (VTI) is 0.92 cm2 Dimensionless index is 0.24 consistent with severe AS. Marland Kitchen Aortic valve regurgitation is mild to moderate.  7. The inferior vena cava is normal in size with greater than 50% respiratory variability, suggesting right atrial pressure of 3 mmHg. FINDINGS  Left Ventricle: Inferolateral hypokinesis. Left ventricular ejection fraction, by estimation, is 45%%.  The left ventricle has mildly decreased function. The left ventricular internal cavity size was moderately dilated. There is mild left ventricular hypertrophy. Left ventricular diastolic parameters are consistent with Grade II diastolic dysfunction (pseudonormalization). Elevated left atrial pressure. Right Ventricle: The right ventricular size is normal. Right vetricular wall thickness was not assessed. Right ventricular systolic function is normal. There is moderately elevated pulmonary artery systolic pressure. The tricuspid regurgitant velocity is  3.38 m/s, and with an assumed right atrial pressure of 3 mmHg, the estimated right ventricular systolic pressure is 06.2 mmHg. Left Atrium: Left atrial size was moderately dilated. Right Atrium: Right atrial size was mildly dilated. Pericardium: There is no evidence of pericardial effusion.  Mitral Valve: There is mild thickening of the mitral valve leaflet(s). Mild to moderate mitral annular calcification. Mild to moderate mitral valve regurgitation. Tricuspid Valve: The tricuspid valve is normal in structure. Tricuspid valve regurgitation is mild. Aortic Valve: AV is thickened, calcified with restricted motion. Peak and mean gradients through the valve are 41 and 24 mm Hg respectively. AVA (VTI) is 0.92 cm2 Dimensionless index is 0.24 consistent with severe AS. Aortic valve regurgitation is mild to moderate. Aortic regurgitation PHT measures 230 msec. Aortic valve mean gradient measures 23.5 mmHg. Aortic valve peak gradient measures 41.9 mmHg. Aortic valve area, by VTI measures 0.90 cm. Pulmonic Valve: The pulmonic valve was normal in structure. Pulmonic valve regurgitation is mild. Aorta: The aortic root and ascending aorta are structurally normal, with no evidence of dilitation. Venous: The inferior vena cava is normal in size with greater than 50% respiratory variability, suggesting right atrial pressure of 3 mmHg. IAS/Shunts: No atrial level shunt detected by  color flow Doppler.  LEFT VENTRICLE PLAX 2D LVIDd:         5.70 cm      Diastology LVIDs:         4.20 cm      LV e' medial:    3.15 cm/s LV PW:         1.20 cm      LV E/e' medial:  35.6 LV IVS:        1.10 cm      LV e' lateral:   5.66 cm/s LVOT diam:     2.20 cm      LV E/e' lateral: 19.8 LV SV:         75 LV SV Index:   38 LVOT Area:     3.80 cm                              3D Volume EF: LV Volumes (MOD)            3D EF:        44 % LV vol d, MOD A2C: 189.0 ml LV EDV:       252 ml LV vol d, MOD A4C: 197.0 ml LV ESV:       141 ml LV vol s, MOD A2C: 117.0 ml LV SV:        111 ml LV vol s, MOD A4C: 120.0 ml LV SV MOD A2C:     72.0 ml LV SV MOD A4C:     197.0 ml LV SV MOD BP:      73.4 ml RIGHT VENTRICLE            IVC RV Basal diam:  3.90 cm    IVC diam: 1.60 cm RV S prime:     7.95 cm/s TAPSE (M-mode): 2.6 cm LEFT ATRIUM            Index        RIGHT ATRIUM           Index LA diam:      5.10 cm  2.57 cm/m   RA Area:     20.50 cm LA Vol (A2C): 108.0 ml 54.43 ml/m  RA Volume:   63.90 ml  32.20 ml/m  AORTIC VALVE AV Area (Vmax):    1.00 cm AV Area (Vmean):   0.96 cm AV Area (VTI):     0.90 cm AV Vmax:  323.50 cm/s AV Vmean:          232.000 cm/s AV VTI:            0.835 m AV Peak Grad:      41.9 mmHg AV Mean Grad:      23.5 mmHg LVOT Vmax:         85.30 cm/s LVOT Vmean:        58.300 cm/s LVOT VTI:          0.198 m LVOT/AV VTI ratio: 0.24 AI PHT:            230 msec  AORTA Ao Root diam: 3.50 cm Ao Asc diam:  3.20 cm MITRAL VALVE                TRICUSPID VALVE MV Area (PHT): 4.49 cm     TR Peak grad:   45.7 mmHg MV Decel Time: 169 msec     TR Vmax:        338.00 cm/s MV E velocity: 112.00 cm/s MV A velocity: 84.80 cm/s   SHUNTS MV E/A ratio:  1.32         Systemic VTI:  0.20 m                             Systemic Diam: 2.20 cm Dorris Carnes MD Electronically signed by Dorris Carnes MD Signature Date/Time: 05/24/2021/4:07:58 PM    Final      Scheduled Meds:  aspirin  325 mg Oral Daily   atorvastatin  80  mg Oral Daily   carvedilol  6.25 mg Oral BID   Chlorhexidine Gluconate Cloth  6 each Topical Daily   clopidogrel  75 mg Oral Daily   heparin  1,000 Units Intravenous Once   levothyroxine  50 mcg Oral QAC breakfast   pantoprazole  40 mg Oral Daily   sodium chloride flush  3 mL Intravenous Q12H   Continuous Infusions:  sodium chloride     sodium chloride     sodium chloride     heparin 1,450 Units/hr (05/24/21 1147)   nitroGLYCERIN Stopped (05/23/21 1950)     LOS: 1 day   CRITICAL CARE Performed by: Barton Dubois MD   Total critical care time: 55 minutes  Critical care time was exclusive of separately billable procedures and treating other patients.  Critical care was necessary to treat or prevent imminent or life-threatening deterioration.  Critical care was time spent personally by me on the following activities: development of treatment plan with patient and/or surrogate as well as nursing, discussions with consultants, evaluation of patient's response to treatment, examination of patient, obtaining history from patient or surrogate, ordering and performing treatments and interventions, ordering and review of laboratory studies, ordering and review of radiographic studies, pulse oximetry and re-evaluation of patient's condition.   To contact the attending provider between 7A-7P or the covering provider during after hours 7P-7A, please log into the web site [email or web address] and access using universal [place] password for that web site. If you do not have the password, please call the hospital operator.  05/24/2021, 6:02 PM

## 2021-05-24 NOTE — Progress Notes (Signed)
Patient arrived on unit via bed, AOx4 able to communicate needs. Heparin drip infusing in L upper extremity, oriented to room and call bell. No needs expressed at this time.

## 2021-05-24 NOTE — Consult Note (Addendum)
Cardiology Consultation:   Patient ID: [name] MRN: [id number]; DOB: [birth year]  Admit date: 05/23/2021 Date of Consult: 05/24/2021  PCP:  [facility] Cardiologist:  Carlyle Dolly, MD        Patient Profile:   [name] is a 76 y.o. male with a hx of CAD who is being seen 05/24/2021 for the evaluation of NSTEMI at the request of Dr. Clifton Custard.  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  He was admitted to the [place] for an MI 03/2021 and medically managed at that time given his known disease and poor targets by prior catheterization, continued on Plavix alone b/c of prior GI bleed. Admitted 04/06/21 with abdominal pain and had elevated troponins and lat TWI.Marland Kitchen Repeat echo showed normal LVEF 55-60%, no WMA.  Medical therapy recommended. Was treated with heprarin for 48 hrs.   Patient admitted yesterday with chest pain, dyspnea, diaphoresis worse with exertion relieved with NTG. Troponins up >14,000 this am, BNP 3900.. Dr. Harrington Challenger reviewed and recommended medical management. Also Hypoxemic resp failure and started on BiPAP with plans for HD earlier than scheduled. He is DNR. Patient says he's been having severe chest pain for over 3 months that he thought was indigestion, also complicated by cholelithiasis admission 05/12/21. He's been drinking mylanta without relief. Never tried NTG. Pain severe yest with orthopnea.Has lost weight as he's not eating much. Now sitting in a chair pain free. Feels much better since  dialysis last night.   Past Medical History:  Diagnosis Date   Acute myocardial infarction of other inferior wall, initial episode of care    Anemia    Anginal pain ([place])    Anxiety    Arthritis    Cancer ([place])    bladder 2013 removed, has cystoscopy 10/2016, has returned x 2   COPD (chronic obstructive pulmonary disease) ([place])    Coronary artery disease    Artery bypass graft Jan 2008   Dialysis patient [facility])    Dyspnea    Dysrhythmia    GERD (gastroesophageal reflux disease)    Heart murmur    History of hiatal hernia    Hyperlipidemia    Hypertension    Hypothyroidism    PONV (postoperative nausea and vomiting)    Renal disorder    Sleep apnea    Stroke [facility])    Tobacco user     Past Surgical History:  Procedure Laterality Date   APPENDECTOMY     AV FISTULA PLACEMENT Right 02/11/2020   Procedure: RIGHT ARM BRACHIOCEPHALIC ARTERIOVENOUS (AV) FISTULA;  Surgeon: Angelia Mould, MD;  Location: [place];  Service: Vascular;  Laterality: Right;   CARDIAC CATHETERIZATION     COLONOSCOPY WITH PROPOFOL N/A 09/02/2020   Procedure: COLONOSCOPY WITH PROPOFOL;  Surgeon: Rogene Houston, MD;  [facility];  Service: Endoscopy;  Laterality: N/A;   CORONARY ARTERY BYPASS GRAFT  06/14/2006   x 5   CORONARY ARTERY BYPASS GRAFT N/A 09/19/2016   Procedure: REDO CORONARY ARTERY BYPASS GRAFTING (CABG) x two, using right  internal mammary artery and left radial artery;  Surgeon: [name], MD;  Location: [place] OR;  Service: Open Heart Surgery;  Laterality: N/A;   FOOT SURGERY     HEMOSTASIS CLIP PLACEMENT  09/02/2020   Procedure: HEMOSTASIS CLIP PLACEMENT;  Surgeon: Rogene Houston, MD;  [facility];  Service: Endoscopy;;   HERNIA REPAIR     HOT HEMOSTASIS  09/02/2020   Procedure: HOT HEMOSTASIS (ARGON PLASMA COAGULATION/BICAP);  Surgeon: Rogene Houston, MD;  [facility];  Service: Endoscopy;;   LEFT HEART CATH AND CORS/GRAFTS ANGIOGRAPHY N/A  09/14/2016   Procedure: Left Heart Cath and Cors/Grafts Angiography;  Surgeon: Troy Sine, MD;  Location: [place] CV LAB;  Service: Cardiovascular;  Laterality: N/A;   LEFT HEART CATHETERIZATION WITH CORONARY/GRAFT ANGIOGRAM N/A 01/31/2014   Procedure: LEFT HEART CATHETERIZATION WITH Beatrix Fetters;  Surgeon: Sanda Klein, MD;  Location: [place] CATH LAB;  Service: Cardiovascular;  Laterality: N/A;   NOSE SURGERY     PERCUTANEOUS CORONARY STENT INTERVENTION (PCI-S)  01/31/2014   Procedure: PERCUTANEOUS CORONARY STENT INTERVENTION (PCI-S);  Surgeon: Sanda Klein, MD;  Location: [facility] CATH LAB;  Service: Cardiovascular;;   POLYPECTOMY  09/02/2020   Procedure: POLYPECTOMY;  Surgeon: Rogene Houston, MD;  [facility];  Service: Endoscopy;;   RADIAL ARTERY HARVEST Left 09/19/2016   Procedure: RADIAL ARTERY HARVEST;  Surgeon: [name], MD;  Location: [place];  Service: Open Heart Surgery;  Laterality: Left;   TEE WITHOUT CARDIOVERSION N/A 09/19/2016   Procedure: TRANSESOPHAGEAL ECHOCARDIOGRAM (TEE);  Surgeon: [name], MD;  Location: [place];  Service: Open Heart Surgery;  Laterality: N/A;     Home Medications:  Prior to Admission medications   Medication Sig Start Date End Date Taking? Authorizing Provider  acetaminophen (TYLENOL) 325 MG tablet Take 2 tablets (650 mg total) by mouth every 6 (six) hours as needed for mild pain. 09/23/16  Yes Lars Pinks M, PA-C  allopurinol (ZYLOPRIM) 100 MG tablet Take 100 mg by mouth daily.  05/03/19  Yes [provider]  atorvastatin (LIPITOR) 80 MG tablet Take 80 mg by mouth daily.   Yes [provider]  carvedilol (COREG) 3.125 MG tablet Take 1 tablet (3.125 mg total) by mouth 2 (two) times daily. 05/10/20 05/23/21 Yes Debbe Odea, MD  clopidogrel (PLAVIX) 75 MG tablet Take 75 mg by mouth daily.   Yes [provider]  finasteride (PROSCAR) 5 MG tablet Take 5 mg by mouth daily.   Yes [provider]  folic acid (FOLVITE) 1 MG tablet Take 1 mg by mouth daily.   Yes [provider]  furosemide (LASIX) 80 MG tablet Take 1 tablet (80 mg total) by mouth every Tuesday, Thursday, Saturday, and Sunday. 09/05/20  Yes Tat, Shanon Brow, MD  isosorbide mononitrate (IMDUR) 120 MG 24 hr tablet Take 1 tablet (120 mg total) by mouth daily. 05/18/19  Yes Emokpae, Courage, MD  levothyroxine (SYNTHROID, LEVOTHROID) 50 MCG tablet Take 50 mcg by mouth daily before breakfast.   Yes [provider]  nitroGLYCERIN (NITROSTAT) 0.4 MG SL tablet Place 0.4 mg under the tongue every 5 (five) minutes as needed for chest pain (max 3 doses).   Yes [provider]  Omega-3 Fatty Acids (RA FISH OIL) 1000 MG CAPS Take 2,000 mg by mouth 2 (two) times daily.   Yes [provider]  omeprazole (PRILOSEC) 40 MG capsule Take 20 mg by mouth daily. 02/14/20  Yes [provider]  tamsulosin (FLOMAX) 0.4 MG CAPS capsule Take 0.4 mg by mouth daily.   Yes [provider]  Vitamin D, Cholecalciferol, 25 MCG (1000 UT) CAPS Take 1,000 Units by mouth daily. Patient not taking: Reported on 05/23/2021    [provider]    Inpatient Medications: Scheduled Meds:  aspirin  325 mg Oral Daily   atorvastatin  80 mg Oral Daily   carvedilol  6.25 mg Oral BID   Chlorhexidine Gluconate Cloth  6 each Topical Daily   clopidogrel  75 mg Oral Daily   heparin  1,000 Units Intravenous Once   levothyroxine  50 mcg Oral QAC breakfast   pantoprazole  40 mg Oral Daily   sodium chloride flush  3 mL Intravenous Q12H   Continuous Infusions:  sodium chloride     sodium chloride     sodium chloride     heparin 1,350 Units/hr (05/24/21 0315)   nitroGLYCERIN Stopped (05/23/21 1950)   PRN Meds: sodium chloride, sodium chloride, sodium chloride, acetaminophen **OR** acetaminophen, HYDROmorphone (DILAUDID) injection, lidocaine (PF), lidocaine-prilocaine, nitroGLYCERIN, ondansetron **OR**  ondansetron (ZOFRAN) IV, pentafluoroprop-tetrafluoroeth, sodium chloride flush  Allergies:    Allergies  Allergen Reactions   Cardizem Cd [Diltiazem Hcl Er Beads] Palpitations    "Makes heart skip"   Cardizem [Diltiazem]     Social History:   Social History   Socioeconomic History   Marital status: Married    Spouse name: Not on file   Number of children: Not on file   Years of education: Not on file   Highest education level: Not on file  Occupational History   Not on file  Tobacco Use   Smoking status: Former    Packs/day: 1.00    Years: 40.00    Pack years: 40.00    Types: Cigarettes    Start date: 02/20/1959    Quit date: 09/13/2016    Years since quitting: 4.6   Smokeless tobacco: Never  Vaping Use   Vaping Use: Never used  Substance and Sexual Activity   Alcohol use: No    Alcohol/week: 0.0 standard drinks    Comment: "No, not really"   Drug use: No   Sexual activity: Not Currently  Other Topics Concern   Not on file  Social History Narrative   Not on file   Social Determinants of Health   Financial Resource Strain: Not on file  Food Insecurity: Not on file  Transportation Needs: Not on file  Physical Activity: Not on file  Stress: Not on file  Social Connections: Not on file  Intimate Partner Violence: Not on file    Family History:     Family History  Problem Relation Age of Onset   Heart attack Mother    Heart attack Brother    Colon cancer Neg Hx    Colon polyps Neg Hx      ROS:  Please see the history of present illness.  Review of Systems  Constitutional: Negative.  HENT: Negative.    Cardiovascular:  Positive for chest pain and dyspnea on exertion.  Respiratory: Negative.    Endocrine: Negative.   Hematologic/Lymphatic: Negative.   Musculoskeletal: Negative.   Gastrointestinal:  Positive for abdominal pain, heartburn and nausea.  Genitourinary: Negative.   Neurological: Negative.    All other ROS reviewed and negative.      Physical Exam/Data:   Vitals:   05/24/21 0500 05/24/21 0539 05/24/21 0600 05/24/21 0818  BP:  (!) 87/52 (!) 120/54   Pulse:  72 65  Resp:  15 (!) 25   Temp:    98.2 F (36.8 C)  TempSrc:    Axillary  SpO2:  96% 99%   Weight: 82.5 kg     Height:        Intake/Output Summary (Last 24 hours) at 05/24/2021 0840 Last data filed at 05/24/2021 0600 Gross per 24 hour  Intake 1093.17 ml  Output 3900 ml  Net -2806.83 ml   Last 3 Weights 05/24/2021 05/23/2021 05/23/2021  Weight (lbs) 181 lb 14.1 oz 184 lb 1.4 oz 184 lb  Weight (kg) 82.5 kg 83.5 kg 83.462 kg     Body mass index is 26.86 kg/m.  General:  Well nourished, well developed, in no acute distress  HEENT: normal Neck: no JVD Vascular: bilateral carotid bruits; Distal pulses 2+ bilaterally Cardiac:  normal S1, S2; ZES;9/2 systolic murmur RSB & LSB Lungs:  clear to auscultation bilaterally, no wheezing, rhonchi or rales  Abd: soft, nontender, no hepatomegaly  Ext: trace ankle edema Musculoskeletal:  No deformities, BUE and BLE strength normal and equal Skin: warm and dry  Neuro:  CNs 2-12 intact, no focal abnormalities noted Psych:  Normal affect   EKG:  The EKG was personally reviewed and demonstrates:  yesterday Sinus tachycardia with ST depression-diffuse, now NSR with LVH and repolarization changes with TWI laterally-similar to prior tracings. Telemetry:  Telemetry was personally reviewed and demonstrates:  NSR with 6 beat NSVT this am  Relevant CV Studies:   Echo 03/2021 IMPRESSIONS     1. Left ventricular ejection fraction, by estimation, is 55 to 60%. The  left ventricle has normal function. The left ventricle has no regional  wall motion abnormalities. There is severe left ventricular hypertrophy.  Left ventricular diastolic parameters   are indeterminate.   2. Right ventricular systolic function is normal. The right ventricular  size is normal. There is normal pulmonary artery systolic pressure.   3. The mitral  valve is normal in structure. No evidence of mitral valve  regurgitation. No evidence of mitral stenosis.   4. The aortic valve has an indeterminant number of cusps. There is severe  calcifcation of the aortic valve. There is severe thickening of the aortic  valve. Aortic valve regurgitation is mild. Mild to moderate aortic valve  stenosis.   5. The inferior vena cava is normal in size with greater than 50%  respiratory variability, suggesting right atrial pressure of 3 mmHg.   FINDINGS   Left Ventricle: Left ventricular ejection fraction, by estimation, is 55  to 60%. The left ventricle has normal function. The left ventricle has no  regional wall motion abnormalities. The left ventricular internal cavity  size was normal in size. There is   severe left ventricular hypertrophy. Left ventricular diastolic  parameters are indeterminate.   Right Ventricle: The right ventricular size is normal. No increase in  right ventricular wall thickness. Right ventricular systolic function is  normal. There is normal pulmonary artery systolic pressure. The tricuspid  regurgitant velocity is 1.70 m/s, and   with an assumed right atrial pressure of 3 mmHg, the estimated right  ventricular systolic pressure is 33.0 mmHg.   Left Atrium: Left atrial size was normal in size.   Right Atrium: Right atrial size was normal in size.   Pericardium: There is no evidence of pericardial effusion.   Mitral Valve: The mitral valve is normal in structure. No evidence of  mitral valve regurgitation. No evidence of mitral valve stenosis. MV peak  gradient, 5.0 mmHg.  The mean mitral valve gradient is 2.0 mmHg.   Tricuspid Valve: The tricuspid valve is normal in structure. Tricuspid  valve regurgitation is not demonstrated. No evidence of tricuspid  stenosis.   Aortic Valve: The aortic valve has an indeterminant number of cusps. There  is severe calcifcation of the aortic valve. There is severe thickening of   the aortic valve. There is severe aortic valve annular calcification.  Aortic valve regurgitation is mild.   Aortic regurgitation PHT measures 376 msec. Mild to moderate aortic  stenosis is present. Aortic valve mean gradient measures 17.2 mmHg. Aortic  valve peak gradient measures 30.7 mmHg. Aortic valve area, by VTI measures  1.20 cm.   Pulmonic Valve: The pulmonic valve was not well visualized. Pulmonic valve  regurgitation is not visualized. No evidence of pulmonic stenosis.   Aorta: The aortic root is normal in size and structure.   Venous: The inferior vena cava is normal in size with greater than 50%  respiratory variability, suggesting right atrial pressure of 3 mmHg.   IAS/Shunts: No atrial level shunt detected by color flow Doppler. Cardiac Catheterization: 06/2017 Findings:  1. Severe native CAD with occlusion of the LAD at the mid-portion and 80%  proximal RCA disease  2. Patent LIMA-LAD, LRA graft-distal RCA, SVG-OM/D1.  3. Occlusion of the RIMA mid-graft, though this was semi-engaged.  4. Diffuse plaque and acute on chronic occlusion of the SVG-D2. This is  the likely culprit vessel for  he NSTEMI but SVG is do severely diseased,  that percutaneous intervention is likely not beneficial.  5. Chronic occlusion of SVG-rPL.   Recommendations:  1. Medical Management of complex CAD and NSTEMI.     Echocardiogram: 09/2020 SUMMARY  The left ventricular size is normal.  There is mild concentric left ventricular hypertrophy.  Upper septal hypertrophy (sigmoid septum), normal variant.  Left ventricular systolic function is mildly reduced.  LV ejection fraction = 45-50%.  There is mild global hypokinesis of the left ventricle.  Left ventricular filling pattern is pseudonormal.  The right ventricle is normal in size and function.  The left atrium is moderately dilated.  The right atrium is mildly dilated.  Diffuse calcification of the aortic valve. The aortic valve is   trileaflet. Moderate aortic stenosis due to calcific disease with peak  velocity 3.2 m/s. Mean gradient 22 mmhg. AVA 1.3 cm2. Moderate aortic  regurgitation.  Moderate mitral regurgitation with central MR jet.  There is mild tricuspid regurgitation.Estimated right ventricular  systolic pressure is 57 mmHg.  Moderate pulmonary hypertension.  Pulmonary venous flow pattern is blunted  IVC normal size with decreased inspiratory collapse, suggestive of  elevated RA pressures.  There is no pericardial effusion.  There is no comparison study available.     Laboratory Data:  High Sensitivity Troponin:   Recent Labs  Lab 05/23/21 0417 05/23/21 0723 05/23/21 2302 05/24/21 0116 05/24/21 0452  TROPONINIHS 1,361* 3,951* 11,751* 14,741* 12,806*     Chemistry Recent Labs  Lab 05/23/21 0217 05/24/21 0116  NA 136 136  K 4.4 3.5  CL 95* 96*  CO2 29 30  GLUCOSE 128* 109*  BUN 41* 20  CREATININE 4.90* 2.95*  CALCIUM 8.8* 8.9  GFRNONAA 12* 22*  ANIONGAP 12 10    Recent Labs  Lab 05/23/21 0217 05/24/21 0116  PROT 7.1  --   ALBUMIN 3.3* 3.2*  AST 18  --   ALT 12  --   ALKPHOS 93  --   BILITOT 0.8  --  Lipids No results for input(s): CHOL, TRIG, HDL, LABVLDL, LDLCALC, CHOLHDL in the last 168 hours.  Hematology Recent Labs  Lab 05/23/21 0217 05/24/21 0116  WBC 8.1 8.6  RBC 2.60* 2.66*  HGB 8.3* 8.1*  HCT 25.5* 25.3*  MCV 98.1 95.1  MCH 31.9 30.5  MCHC 32.5 32.0  RDW 14.7 14.9  PLT 125* 138*   Thyroid No results for input(s): TSH, FREET4 in the last 168 hours.  BNP Recent Labs  Lab 05/23/21 0417  BNP 3,900.0*    DDimer No results for input(s): DDIMER in the last 168 hours.   Radiology/Studies:  DG Chest 2 View  Result Date: 05/23/2021 CLINICAL DATA:  Chest pain EXAM: CHEST - 2 VIEW COMPARISON:  04/05/2021 FINDINGS: Prior CABG. Heart is borderline in size. Bilateral perihilar and lower lobe airspace opacities. No effusions. No acute bony abnormality.  IMPRESSION: Bilateral perihilar and lower lobe opacities concerning for edema. Electronically Signed   By: Rolm Baptise M.D.   On: 05/23/2021 03:06   DG CHEST PORT 1 VIEW  Result Date: 05/24/2021 CLINICAL DATA:  76 year old male with history of end-stage renal disease and bladder cancer presenting with shortness of breath. EXAM: PORTABLE CHEST 1 VIEW COMPARISON:  Chest x-ray 05/23/2021. FINDINGS: Lung volumes are low. No acute consolidative airspace disease. Trace right pleural effusion. No left pleural effusion. No pneumothorax. Mild diffuse peribronchial cuffing and interstitial prominence. Cephalization of the pulmonary vasculature noted on the prior examination has resolved. Heart size is upper limits of normal. Upper mediastinal contours are within normal limits. Atherosclerotic calcifications are noted in the thoracic aorta. Status post median sternotomy for CABG. IMPRESSION: 1. The appearance of the chest suggests resolving pulmonary edema, as above. 2. Trace right pleural effusion. 3. Aortic atherosclerosis. Electronically Signed   By: Vinnie Langton M.D.   On: 05/24/2021 05:24     Assessment and Plan:   NSTEMI troponins this am >14,000 plan for medical management with histroy of severe diffuse disease, known occluded grafts, distal vessels severely diseased and not a cath candidate. Currently DNR. Maximize medical therapy. He's now on IV heparin, ASA(not used previously with GI bleed), plavix, coreg, atorvastatin. BP runs low on dialysis days and Hydralazine stopped by [facility] cardiologist. Repeat echo today.  CAD (s/p CABG in 2008 with LIMA-LAD, SVG-D2, SVG-OM and SVG-RPL, DES to SVG-RCA in 01/2014, re-do CABG in 09/2016 with RIMA-diagonal and left radial-distal RCA, cath in 06/2017 showing occluded RIMA graft, chronic occlusion of SVG-rPL and acute on chronic occlusion of SVG-D2 and poor PCI target with medical management recommended), NSTEMI 03/2021 treated medically.  Acute CHF-BNP 3900-managed  with dialysis and BiPAP-feeling much better  Aortic stenosis-mild-mod on echo 03/2021  ESRD on HD  Carotid stenosis followed by Dr. Donnetta Hutching 23-55% LICA 73/2202   Risk Assessment/Risk Scores:     TIMI Risk Score for Unstable Angina or Non-ST Elevation MI:   The patient's TIMI risk score is 7, which indicates a 41% risk of all cause mortality, new or recurrent myocardial infarction or need for urgent revascularization in the next 14 days.  New York Heart Association (NYHA) Functional Class NYHA Class III        For questions or updates, please contact [facility] Please consult [email or web address] for contact info under    Signed, Ermalinda Barrios, PA-C  05/24/2021 8:40 AM   Patient seen and examined   I have reviewed findings of M Lenze above and agree Pt a 76 yo with severe CAD (s/p CABG, last cath in 2018), AS,  ESRD (on dialysis)    Followed closely.  Admitted with MI in Nov 2022   Rx medically due to poor targes.     Admitted later with NSTEMI   Rx medically for 48 hours  The pt reports that his appetite ahs been down    He is eating less   Also had some gallbladder trouble and has been avoiding some foods He was dialyzed Fri   Felt OK Yesterday he  developed severe CP, SOB, diaphresis  Troponins elevated as noted above   He was treated with IV heparin and underwent HD yesterday Symptoms resolved   patient feeling much better     On exam, he currently appears comfortable   Eating  Neck:  JVP is normal  Lungs with decreased BS at bases Cardiac exam   RRR  Gr III/VI systolic murmur LSB/Base Abd is supple  Ext with tr to 1+ Edema   Echo today, LVEF is mildly depressed   AS appears severe, Gradients not severely elevated but dimensionless index is severe   (SVI 38)   Talking to the patient more he says that after discussion with nephrology today, his dry weight may have been inaccurately estimated.  He has not been eating as much   Dry weight estimates may have been too  generous.  Will need to review response to increased dialysis, fluid removal   His hemodynamics are very fragile with aortic stenosis.     Easy to decompensate Troponin elevation may be due to worsening of CAD   Or worsening of AS and CAD.    He has not been felt to be a candidate for revascularizatoni due to severe diffuse dz.   Will need to review films from 2018.    Given severe CAD, do not think would be candidate for valve intervention alone.    Would complete 48 hours of heparin  Follow on medical Rx. For dialysis tomorrow  Dorris Carnes MD

## 2021-05-24 NOTE — Progress Notes (Signed)
°  Echocardiogram 2D Echocardiogram has been performed.  Merrie Roof F 05/24/2021, 12:45 PM

## 2021-05-24 NOTE — Progress Notes (Signed)
ANTICOAGULATION CONSULT NOTE -   Pharmacy Consult for heparin Indication: chest pain/ACS  Allergies  Allergen Reactions   Cardizem Cd [Diltiazem Hcl Er Beads] Palpitations    "Makes heart skip"   Cardizem [Diltiazem]     Patient Measurements: Height: 5\' 9"  (175.3 cm) Weight: 82.5 kg (181 lb 14.1 oz) IBW/kg (Calculated) : 70.7  Vital Signs: Temp: 97.5 F (36.4 C) (01/09 1638) Temp Source: Oral (01/09 1638) BP: 109/21 (01/09 1400) Pulse Rate: 76 (01/09 1500)  Labs: Recent Labs    05/23/21 0217 05/23/21 0417 05/23/21 2152 05/23/21 2302 05/24/21 0116 05/24/21 0452 05/24/21 0718 05/24/21 1503  HGB 8.3*  --   --   --  8.1*  --   --   --   HCT 25.5*  --   --   --  25.3*  --   --   --   PLT 125*  --   --   --  138*  --   --   --   HEPARINUNFRC  --    < > 0.31  --   --   --  0.25* 0.28*  CREATININE 4.90*  --   --   --  2.95*  --   --   --   TROPONINIHS 505*   < >  --  11,751* 14,741* 12,806*  --   --    < > = values in this interval not displayed.     Estimated Creatinine Clearance: 22 mL/min (A) (by C-G formula based on SCr of 2.95 mg/dL (H)).   Medical History: Past Medical History:  Diagnosis Date   Acute myocardial infarction of other inferior wall, initial episode of care    Anemia    Anginal pain ([place])    Anxiety    Arthritis    Cancer ([place])    bladder 2013 removed, has cystoscopy 10/2016, has returned x 2   COPD (chronic obstructive pulmonary disease) ([place])    Coronary artery disease    Artery bypass graft Jan 2008   Dialysis patient [facility])    Dyspnea    Dysrhythmia    GERD (gastroesophageal reflux disease)    Heart murmur    History of hiatal hernia    Hyperlipidemia    Hypertension    Hypothyroidism    PONV (postoperative nausea and vomiting)    Renal disorder    Sleep apnea    Stroke ([place])    Tobacco user     Assessment: 76yo male c/o "heartburn" associated with diaphoresis but on arrival to ED ECG reveals global ischemia >> troponin  elevated >> to begin heparin.  HL 0.28- slightly subtherapeutic   Goal of Therapy:  Heparin level 0.3-0.7 units/ml Monitor platelets by anticoagulation protocol: Yes   Plan:  Heparin bolus 1000 units Increase heparin infusion to 1600 units/hr. Monitor heparin levels in ~8 hours, daily and CBC.  Margot Ables, PharmD Clinical Pharmacist 05/24/2021 5:17 PM

## 2021-05-24 NOTE — Progress Notes (Signed)
Admit: 05/23/2021 LOS: 1  44M ESRD [facility] MWF RUE AVF with AHRF req BiPAP; known CAD with NSTEMI  Subjective:  HD yesterday 3.9L UF, now on [place] feels much improved on 4L [place] Was having poor PO prior to admit (EDW was probably too high as we lost lean weight)  BPs stable, Afebrile  01/08 0701 - 01/09 0700 In: 1093.2 [P.O.:720; I.V.:373.2] Out: 4000 [Urine:100]  Filed Weights   05/23/21 0159 05/23/21 1740 05/24/21 0500  Weight: 83.5 kg 83.5 kg 82.5 kg    Scheduled Meds:  aspirin  325 mg Oral Daily   atorvastatin  80 mg Oral Daily   carvedilol  6.25 mg Oral BID   Chlorhexidine Gluconate Cloth  6 each Topical Daily   clopidogrel  75 mg Oral Daily   levothyroxine  50 mcg Oral QAC breakfast   pantoprazole  40 mg Oral Daily   sodium chloride flush  3 mL Intravenous Q12H   Continuous Infusions:  sodium chloride     sodium chloride     sodium chloride     heparin 1,450 Units/hr (05/24/21 0906)   nitroGLYCERIN Stopped (05/23/21 1950)   PRN Meds:.sodium chloride, sodium chloride, sodium chloride, acetaminophen **OR** acetaminophen, HYDROmorphone (DILAUDID) injection, lidocaine (PF), lidocaine-prilocaine, nitroGLYCERIN, ondansetron **OR** ondansetron (ZOFRAN) IV, pentafluoroprop-tetrafluoroeth, sodium chloride flush  Current Labs: reviewed    Physical Exam:  Blood pressure (!) 139/25, pulse 74, temperature 98.2 F (36.8 C), temperature source Axillary, resp. rate (!) 23, height 5\' 9"  (1.753 m), weight 82.5 kg, SpO2 92 %. NAD in bed Speaks full sentences RRR CTAB, nl wob Trace LEE b/l S/nt/nd RUE AVF +B/T Nonfocal, cn2-12 intact  A ESRD MWF [facility] RUE AVF SOB/ AHRF req BiPAP / Pulm edema improved after emergent HD 05/23/21 HTN, BPs improved NSTEMI, cardiology consult today CKD-BMD, P was low, done after HD Anemia, outpt meds unclear P HD tomorrow then back on MWF schedule, AVF, 2K, 3-4L UF SBP > 105, no heparin (systemic for NSTEMI) Medication Issues; Preferred  narcotic agents for pain control are hydromorphone, fentanyl, and methadone. Morphine should not be used.  Baclofen should be avoided Avoid oral sodium phosphate and magnesium citrate based laxatives / bowel preps    Pearson Grippe MD 05/24/2021, 10:39 AM  Recent Labs  Lab 05/23/21 0217 05/24/21 0116  NA 136 136  K 4.4 3.5  CL 95* 96*  CO2 29 30  GLUCOSE 128* 109*  BUN 41* 20  CREATININE 4.90* 2.95*  CALCIUM 8.8* 8.9  PHOS  --  1.8*   Recent Labs  Lab 05/23/21 0217 05/24/21 0116  WBC 8.1 8.6  HGB 8.3* 8.1*  HCT 25.5* 25.3*  MCV 98.1 95.1  PLT 125* 138*

## 2021-05-24 NOTE — Progress Notes (Signed)
MD notified that troponin was called and is critical. [place] [address] currently. Patient denies CP, SOB, and difficulty breathing. Patient sleeping soundly. Heparin infusing at 1350 units per hour. EKG obtained. NSR and PACs present. Will continue to monitor.

## 2021-05-24 NOTE — Progress Notes (Addendum)
ANTICOAGULATION CONSULT NOTE -   Pharmacy Consult for heparin Indication: chest pain/ACS  Allergies  Allergen Reactions   Cardizem Cd [Diltiazem Hcl Er Beads] Palpitations    "Makes heart skip"   Cardizem [Diltiazem]     Patient Measurements: Height: 5\' 9"  (175.3 cm) Weight: 82.5 kg (181 lb 14.1 oz) IBW/kg (Calculated) : 70.7  Vital Signs: Temp: 98.2 F (36.8 C) (01/09 0818) Temp Source: Axillary (01/09 0818) BP: 120/54 (01/09 0600) Pulse Rate: 65 (01/09 0600)  Labs: Recent Labs    05/23/21 0217 05/23/21 0417 05/23/21 1147 05/23/21 2152 05/23/21 2302 05/24/21 0116 05/24/21 0452 05/24/21 0718  HGB 8.3*  --   --   --   --  8.1*  --   --   HCT 25.5*  --   --   --   --  25.3*  --   --   PLT 125*  --   --   --   --  138*  --   --   HEPARINUNFRC  --   --  0.30 0.31  --   --   --  0.25*  CREATININE 4.90*  --   --   --   --  2.95*  --   --   TROPONINIHS 505*   < >  --   --  11,751* 14,741* 12,806*  --    < > = values in this interval not displayed.     Estimated Creatinine Clearance: 22 mL/min (A) (by C-G formula based on SCr of 2.95 mg/dL (H)).   Medical History: Past Medical History:  Diagnosis Date   Acute myocardial infarction of other inferior wall, initial episode of care    Anemia    Anginal pain ([place])    Anxiety    Arthritis    Cancer ([place])    bladder 2013 removed, has cystoscopy 10/2016, has returned x 2   COPD (chronic obstructive pulmonary disease) ([place])    Coronary artery disease    Artery bypass graft Jan 2008   Dialysis patient [facility])    Dyspnea    Dysrhythmia    GERD (gastroesophageal reflux disease)    Heart murmur    History of hiatal hernia    Hyperlipidemia    Hypertension    Hypothyroidism    PONV (postoperative nausea and vomiting)    Renal disorder    Sleep apnea    Stroke ([place])    Tobacco user     Assessment: 76yo male c/o "heartburn" associated with diaphoresis but on arrival to ED ECG reveals global ischemia >> troponin  elevated >> to begin heparin.  HL 0.25, no issues with infusion  Goal of Therapy:  Heparin level 0.3-0.7 units/ml Monitor platelets by anticoagulation protocol: Yes   Plan:  Heparin bolus 1000 units Increase heparin infusion to 1450 units/hr. Monitor heparin levels in ~8 hours, daily and CBC.  Isac Sarna, BS Vena [place], BCPS Clinical Pharmacist Pager [phone] 05/24/2021,8:21 AM

## 2021-05-24 NOTE — H&P (Signed)
[place] notification completed: [phone]  Ihor Gully, LCSW

## 2021-05-25 DIAGNOSIS — D6489 Other specified anemias: Secondary | ICD-10-CM

## 2021-05-25 DIAGNOSIS — J9601 Acute respiratory failure with hypoxia: Secondary | ICD-10-CM

## 2021-05-25 DIAGNOSIS — Z7189 Other specified counseling: Secondary | ICD-10-CM

## 2021-05-25 DIAGNOSIS — Z515 Encounter for palliative care: Secondary | ICD-10-CM

## 2021-05-25 DIAGNOSIS — I472 Ventricular tachycardia, unspecified: Secondary | ICD-10-CM

## 2021-05-25 LAB — CBC
HCT: 21 % — ABNORMAL LOW (ref 39.0–52.0)
Hemoglobin: 7 g/dL — ABNORMAL LOW (ref 13.0–17.0)
MCH: 32.3 pg (ref 26.0–34.0)
MCHC: 33.3 g/dL (ref 30.0–36.0)
MCV: 96.8 fL (ref 80.0–100.0)
Platelets: 119 10*3/uL — ABNORMAL LOW (ref 150–400)
RBC: 2.17 MIL/uL — ABNORMAL LOW (ref 4.22–5.81)
RDW: 15.1 % (ref 11.5–15.5)
WBC: 4.9 10*3/uL (ref 4.0–10.5)
nRBC: 0 % (ref 0.0–0.2)

## 2021-05-25 LAB — MAGNESIUM: Magnesium: 2.2 mg/dL (ref 1.7–2.4)

## 2021-05-25 LAB — PREPARE RBC (CROSSMATCH)

## 2021-05-25 LAB — BASIC METABOLIC PANEL
Anion gap: 11 (ref 5–15)
BUN: 53 mg/dL — ABNORMAL HIGH (ref 8–23)
CO2: 26 mmol/L (ref 22–32)
Calcium: 8.2 mg/dL — ABNORMAL LOW (ref 8.9–10.3)
Chloride: 96 mmol/L — ABNORMAL LOW (ref 98–111)
Creatinine, Ser: 4.94 mg/dL — ABNORMAL HIGH (ref 0.61–1.24)
GFR, Estimated: 12 mL/min — ABNORMAL LOW (ref >60)
Glucose, Bld: 93 mg/dL (ref 70–99)
Potassium: 4.1 mmol/L (ref 3.5–5.1)
Sodium: 133 mmol/L — ABNORMAL LOW (ref 135–145)

## 2021-05-25 LAB — HEPARIN LEVEL (UNFRACTIONATED)
Heparin Unfractionated: 0.25 IU/mL — ABNORMAL LOW (ref 0.30–0.70)
Heparin Unfractionated: 0.32 IU/mL (ref 0.30–0.70)
Heparin Unfractionated: 0.37 IU/mL (ref 0.30–0.70)

## 2021-05-25 LAB — HEPATITIS B SURFACE ANTIGEN: Hepatitis B Surface Ag: NONREACTIVE

## 2021-05-25 MED ORDER — SODIUM CHLORIDE 0.9 % IV SOLN: 250.0000 mL | INTRAVENOUS | Status: DC | PRN

## 2021-05-25 MED ORDER — ALUM & MAG HYDROXIDE-SIMETH 200-200-20 MG/5ML PO SUSP
30.0000 mL | Freq: Once | ORAL | Status: AC
  Administered 2021-05-25: 30 mL via ORAL
  Filled 2021-05-25: qty 30

## 2021-05-25 MED ORDER — SODIUM CHLORIDE 0.9% FLUSH: 3.0000 mL | INTRAVENOUS | Status: DC | PRN

## 2021-05-25 MED ORDER — PANTOPRAZOLE SODIUM 40 MG PO TBEC
40.0000 mg | DELAYED_RELEASE_TABLET | Freq: Two times a day (BID) | ORAL | Status: DC
  Administered 2021-05-25 – 2021-05-29 (×8): 40 mg via ORAL
  Filled 2021-05-25 (×8): qty 1

## 2021-05-25 MED ORDER — SODIUM CHLORIDE 0.9% IV SOLUTION: Freq: Once | INTRAVENOUS | Status: DC

## 2021-05-25 MED ORDER — ASPIRIN EC 81 MG PO TBEC
81.0000 mg | DELAYED_RELEASE_TABLET | Freq: Every day | ORAL | Status: DC
  Administered 2021-05-25 – 2021-05-29 (×5): 81 mg via ORAL
  Filled 2021-05-25 (×6): qty 1

## 2021-05-25 MED ORDER — SODIUM CHLORIDE 0.9% FLUSH
3.0000 mL | Freq: Two times a day (BID) | INTRAVENOUS | Status: DC
  Administered 2021-05-26 – 2021-05-29 (×7): 3 mL via INTRAVENOUS

## 2021-05-25 MED ORDER — SODIUM CHLORIDE 0.9 % IV SOLN: INTRAVENOUS | Status: DC

## 2021-05-25 NOTE — Plan of Care (Signed)
°  Problem: Education: Goal: Knowledge of General Education information will improve Description: Including pain rating scale, medication(s)/side effects and non-pharmacologic comfort measures Outcome: Progressing   Problem: Health Behavior/Discharge Planning: Goal: Ability to manage health-related needs will improve Outcome: Progressing   Problem: Clinical Measurements: Goal: Ability to maintain clinical measurements within normal limits will improve Outcome: Progressing Goal: Will remain free from infection Outcome: Progressing Goal: Diagnostic test results will improve Outcome: Progressing Goal: Respiratory complications will improve Outcome: Progressing Goal: Cardiovascular complication will be avoided Outcome: Progressing   Problem: Activity: Goal: Risk for activity intolerance will decrease Outcome: Progressing   Problem: Nutrition: Goal: Adequate nutrition will be maintained Outcome: Progressing   Problem: Coping: Goal: Level of anxiety will decrease Outcome: Progressing   Problem: Elimination: Goal: Will not experience complications related to bowel motility Outcome: Progressing Goal: Will not experience complications related to urinary retention Outcome: Progressing   Problem: Pain Managment: Goal: General experience of comfort will improve Outcome: Progressing   Problem: Safety: Goal: Ability to remain free from injury will improve Outcome: Progressing   Problem: Skin Integrity: Goal: Risk for impaired skin integrity will decrease Outcome: Progressing   Problem: Activity: Goal: Capacity to carry out activities will improve Outcome: Progressing   Problem: Cardiac: Goal: Ability to achieve and maintain adequate cardiopulmonary perfusion will improve Outcome: Progressing   Problem: Education: Goal: Understanding of cardiac disease, CV risk reduction, and recovery process will improve Outcome: Progressing Goal: Understanding of medication regimen  will improve Outcome: Progressing Goal: Individualized Educational Video(s) Outcome: Progressing   Problem: Activity: Goal: Ability to tolerate increased activity will improve Outcome: Progressing   Problem: Cardiac: Goal: Ability to achieve and maintain adequate cardiopulmonary perfusion will improve Outcome: Progressing   Problem: Health Behavior/Discharge Planning: Goal: Ability to safely manage health-related needs after discharge will improve Outcome: Progressing

## 2021-05-25 NOTE — Progress Notes (Signed)
Palliative: Chart review completed.  Attempted to see Mr. Trickel, but he was off the floor. PMT to follow.  Conference with transition of care team related to patient condition and disposition.  No charge Quinn Axe, NP Palliative medicine team Team phone [phone] Greater than 50% of this time was spent counseling and coordinating care related to the above assessment and plan.

## 2021-05-25 NOTE — Progress Notes (Signed)
Admit: 05/23/2021 LOS: 2  42M ESRD [facility] MWF RUE AVF with AHRF req BiPAP; known CAD with NSTEMI  Subjective:  Feels great, now on  RA, no reported SOB Hb down to 7 no reported losses  01/09 0701 - 01/10 0700 In: 362.1 [I.V.:362.1] Out: 250 [Urine:250]  Filed Weights   05/24/21 2127 05/25/21 0500 05/25/21 0930  Weight: 82.6 kg 82.6 kg 83 kg    Scheduled Meds:  aspirin EC  81 mg Oral Daily   atorvastatin  80 mg Oral Daily   carvedilol  6.25 mg Oral BID   Chlorhexidine Gluconate Cloth  6 each Topical Daily   clopidogrel  75 mg Oral Daily   levothyroxine  50 mcg Oral QAC breakfast   pantoprazole  40 mg Oral Daily   sodium chloride flush  3 mL Intravenous Q12H   Continuous Infusions:  sodium chloride     sodium chloride     sodium chloride     heparin 1,750 Units/hr (05/25/21 0320)   nitroGLYCERIN Stopped (05/23/21 1950)   PRN Meds:.sodium chloride, sodium chloride, sodium chloride, acetaminophen **OR** acetaminophen, HYDROmorphone (DILAUDID) injection, lidocaine (PF), lidocaine-prilocaine, nitroGLYCERIN, ondansetron **OR** ondansetron (ZOFRAN) IV, pentafluoroprop-tetrafluoroeth, sodium chloride flush  Current Labs: reviewed    Physical Exam:  Blood pressure 119/60, pulse 62, temperature 97.6 F (36.4 C), temperature source Oral, resp. rate 18, height 5\' 9"  (1.753 m), weight 83 kg, SpO2 95 %. NAD in bed Speaks full sentences RRR CTAB, nl wob Trace LEE b/l S/nt/nd RUE AVF +B/T Nonfocal, cn2-12 intact  A ESRD MWF [facility] RUE AVF SOB/ AHRF req BiPAP / Pulm edema improved after emergent HD 05/23/21 HTN, BPs improved NSTEMI, cardiology following; known severe CAD, med mgmt CKD-BMD, P was low, done after HD Anemia, outpt meds unclear, Hb down, ? Accurate, trend, no overt losses  P HD today then back on MWF schedule, AVF, 2K, 3-4L UF SBP > 105, no heparin (systemic for NSTEMI) Need post weights and for outpt unit to be aware of new lowered EDW Medication  Issues; Preferred narcotic agents for pain control are hydromorphone, fentanyl, and methadone. Morphine should not be used.  Baclofen should be avoided Avoid oral sodium phosphate and magnesium citrate based laxatives / bowel preps    Pearson Grippe MD 05/25/2021, 9:44 AM  Recent Labs  Lab 05/23/21 0217 05/24/21 0116  NA 136 136  K 4.4 3.5  CL 95* 96*  CO2 29 30  GLUCOSE 128* 109*  BUN 41* 20  CREATININE 4.90* 2.95*  CALCIUM 8.8* 8.9  PHOS  --  1.8*    Recent Labs  Lab 05/23/21 0217 05/24/21 0116 05/25/21 0513  WBC 8.1 8.6 4.9  HGB 8.3* 8.1* 7.0*  HCT 25.5* 25.3* 21.0*  MCV 98.1 95.1 96.8  PLT 125* 138* 119*

## 2021-05-25 NOTE — Progress Notes (Signed)
ANTICOAGULATION CONSULT NOTE - Follow Up Consult  Pharmacy Consult for heparin Indication:  medical management of NSTEMI  Labs: Recent Labs    05/23/21 0217 05/23/21 0417 05/23/21 2302 05/24/21 0116 05/24/21 0452 05/24/21 0718 05/24/21 1503 05/25/21 0157  HGB 8.3*  --   --  8.1*  --   --   --   --   HCT 25.5*  --   --  25.3*  --   --   --   --   PLT 125*  --   --  138*  --   --   --   --   HEPARINUNFRC  --    < >  --   --   --  0.25* 0.28* 0.25*  CREATININE 4.90*  --   --  2.95*  --   --   --   --   TROPONINIHS 505*   < > 11,751* 14,741* 12,806*  --   --   --    < > = values in this interval not displayed.    Assessment: 76yo male subtherapeutic on heparin with lower heparin level despite increased rate; heparin had been paused for ~74min during day shift but no infusion further issues or signs of bleeding per RN.  Goal of Therapy:  Heparin level 0.3-0.7 units/ml   Plan:  Will increase heparin infusion by 2 units/kg/hr to 1750 units/hr and check level in 8 hours.    Wynona Neat, PharmD, BCPS  05/25/2021,3:17 AM

## 2021-05-25 NOTE — Plan of Care (Signed)

## 2021-05-25 NOTE — Progress Notes (Addendum)
Progress Note  Patient Name: [name] Date of Encounter: 05/25/2021  [facility] Cardiologist: Carlyle Dolly, MD ; Now followed by Th[facility] [place]  Subjective   No chest pain overnight. Breathing has significantly improved. Getting ready to have HD.   Inpatient Medications    Scheduled Meds:  aspirin  325 mg Oral Daily   atorvastatin  80 mg Oral Daily   carvedilol  6.25 mg Oral BID   Chlorhexidine Gluconate Cloth  6 each Topical Daily   clopidogrel  75 mg Oral Daily   levothyroxine  50 mcg Oral QAC breakfast   pantoprazole  40 mg Oral Daily   sodium chloride flush  3 mL Intravenous Q12H   Continuous Infusions:  sodium chloride     sodium chloride     sodium chloride     heparin 1,750 Units/hr (05/25/21 0320)   nitroGLYCERIN Stopped (05/23/21 1950)   PRN Meds: sodium chloride, sodium chloride, sodium chloride, acetaminophen **OR** acetaminophen, HYDROmorphone (DILAUDID) injection, lidocaine (PF), lidocaine-prilocaine, nitroGLYCERIN, ondansetron **OR** ondansetron (ZOFRAN) IV, pentafluoroprop-tetrafluoroeth, sodium chloride flush   Vital Signs    Vitals:   05/24/21 1800 05/24/21 2127 05/25/21 0500 05/25/21 0515  BP: (!) 125/22 (!) 113/36  106/61  Pulse: 78 76  75  Resp: 18 20  18   Temp:  97.6 F (36.4 C)  97.8 F (36.6 C)  TempSrc:    Oral  SpO2: 100% 94%  90%  Weight:  82.6 kg 82.6 kg   Height:  5\' 9"  (1.753 m)      Intake/Output Summary (Last 24 hours) at 05/25/2021 0835 Last data filed at 05/25/2021 0307 Gross per 24 hour  Intake 362.06 ml  Output 250 ml  Net 112.06 ml   Last 3 Weights 05/25/2021 05/24/2021 05/24/2021  Weight (lbs) 182 lb 1.6 oz 182 lb 1.6 oz 181 lb 14.1 oz  Weight (kg) 82.6 kg 82.6 kg 82.5 kg      Telemetry    NSR, HR in 70's to 80's. 16 beats NSVT this AM.  - Personally Reviewed  ECG    No new tracings.   Physical Exam   GEN: Pleasant male appearing in no acute distress.   Neck: No JVD Cardiac: RRR, 3/6 SEM along  RUSB.  Respiratory: Clear to auscultation bilaterally without wheezing or rales GI: Soft, nontender, non-distended  MS: Trace lower extremity edema; No deformity. Neuro:  Nonfocal  Psych: Normal affect   Labs    High Sensitivity Troponin:   Recent Labs  Lab 05/23/21 0417 05/23/21 0723 05/23/21 2302 05/24/21 0116 05/24/21 0452  TROPONINIHS 1,361* 3,951* 11,751* 14,741* 12,806*     Chemistry Recent Labs  Lab 05/23/21 0217 05/24/21 0116  NA 136 136  K 4.4 3.5  CL 95* 96*  CO2 29 30  GLUCOSE 128* 109*  BUN 41* 20  CREATININE 4.90* 2.95*  CALCIUM 8.8* 8.9  PROT 7.1  --   ALBUMIN 3.3* 3.2*  AST 18  --   ALT 12  --   ALKPHOS 93  --   BILITOT 0.8  --   GFRNONAA 12* 22*  ANIONGAP 12 10    Lipids No results for input(s): CHOL, TRIG, HDL, LABVLDL, LDLCALC, CHOLHDL in the last 168 hours.  Hematology Recent Labs  Lab 05/23/21 0217 05/24/21 0116 05/25/21 0513  WBC 8.1 8.6 4.9  RBC 2.60* 2.66* 2.17*  HGB 8.3* 8.1* 7.0*  HCT 25.5* 25.3* 21.0*  MCV 98.1 95.1 96.8  MCH 31.9 30.5 32.3  MCHC 32.5 32.0 33.3  RDW 14.7 14.9 15.1  PLT 125* 138* 119*   Thyroid No results for input(s): TSH, FREET4 in the last 168 hours.  BNP Recent Labs  Lab 05/23/21 0417  BNP 3,900.0*     Radiology    DG CHEST PORT 1 VIEW  Result Date: 05/24/2021 CLINICAL DATA:  76 year old male with history of end-stage renal disease and bladder cancer presenting with shortness of breath. EXAM: PORTABLE CHEST 1 VIEW COMPARISON:  Chest x-ray 05/23/2021. FINDINGS: Lung volumes are low. No acute consolidative airspace disease. Trace right pleural effusion. No left pleural effusion. No pneumothorax. Mild diffuse peribronchial cuffing and interstitial prominence. Cephalization of the pulmonary vasculature noted on the prior examination has resolved. Heart size is upper limits of normal. Upper mediastinal contours are within normal limits. Atherosclerotic calcifications are noted in the thoracic aorta. Status  post median sternotomy for CABG. IMPRESSION: 1. The appearance of the chest suggests resolving pulmonary edema, as above. 2. Trace right pleural effusion. 3. Aortic atherosclerosis. Electronically Signed   By: Vinnie Langton M.D.   On: 05/24/2021 05:24    Cardiac Studies   Cardiac Catheterization [facility]): 06/2017 Findings:  1. Severe native CAD with occlusion of the LAD at the mid-portion and 80%  proximal RCA disease  2. Patent LIMA-LAD, LRA graft-distal RCA, SVG-OM/D1.  3. Occlusion of the RIMA mid-graft, though this was semi-engaged.  4. Diffuse plaque and acute on chronic occlusion of the SVG-D2. This is  the likely culprit vessel for  he NSTEMI but SVG is do severely diseased,  that percutaneous intervention is likely not beneficial.  5. Chronic occlusion of SVG-rPL.   Recommendations:  1. Medical Management of complex CAD and NSTEMI.    Echocardiogram: 05/24/2021 IMPRESSIONS    1. Inferolateral hypokinesis.. Left ventricular ejection fraction, by  estimation, is 45%%. The left ventricular internal cavity size was  moderately dilated. There is mild left ventricular hypertrophy. Left  ventricular diastolic parameters are consistent   with Grade II diastolic dysfunction (pseudonormalization). Elevated left  atrial pressure.   2. Right ventricular systolic function is normal. The right ventricular  size is normal. There is moderately elevated pulmonary artery systolic  pressure.   3. Left atrial size was moderately dilated.   4. Right atrial size was mildly dilated.   5. Mild to moderate mitral valve regurgitation.   6. AV is thickened, calcified with restricted motion. Peak and mean  gradients through the valve are 41 and 24 mm Hg respectively. AVA (VTI) is  0.92 cm2 Dimensionless index is 0.24 consistent with severe AS. Note SVI  is 38, lower end of normal.. Aortic  valve regurgitation is mild to moderate.   7. The inferior vena cava is normal in size with  greater than 50%  respiratory variability, suggesting right atrial pressure of 3 mmHg.   Patient Profile     76 y.o. 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   Assessment & Plan    1. NSTEMI - Presented with chest pain, dyspnea on exertion and diaphoresis for the past few months but symptoms had acutely  worsened the day prior to admission.  - Hs Troponin values have peaked at 14,741 this admission and repeat echo shows his EF is reduced at 45% with inferolateral HK.  - He denies any pain at this time. Dr. Harrington Challenger mentioned reviewing films with Interventional. Will discuss with Dr. Domenic Polite and can hopefully review with someone on the Structural Heart team given his severe AS as well. At this point, the patient is unsure if he would want to pursue a catheterization again given his prior results.  - Continue Heparin, ASA (change to 81mg  daily), Plavix 75mg  daily, Coreg 6.25mg  BID and Atorvastatin 80mg  daily.   2. CAD  - He has known CAD with CABG in 2008 with LIMA-LAD, SVG-D2, SVG-OM and SVG-RPL, DES to SVG-RCA in 01/2014 and ultimately underwent re-do CABG in 09/2016 with RIMA-diagonal and left radial-distal RCA. Most recent cath in 06/2017 showed an occluded RIMA graft, chronic occlusion of SVG-rPL and acute on chronic occlusion of SVG-D2 and poor PCI target with medical management recommended. Did have a patent LIMA-LAD, LRA graft-distal RCA and SVG-OM/D1.  -  Did have an NSTEMI in 03/2021 with Hs Troponin up to 2885 and echo showed an EF of 55-60% with no  regional WMA. Medical management was recommended at that time and was treated with Heparin.  - Undergoing work-up as outlined above. Remains on ASA, Plavix, BB and statin therapy.   3. Acute HFrEF - EF was at 45-50% in 09/2020, improved to 55-60% by echo in 03/2021. EF reduced at 45% this admission. Volume management per HD. Remains on Coreg.   4. Aortic Stenosis - Read as mild to moderate by echo in 03/2021, severe by echo this admission. Will need to review with the Structural Heart team as outlined above.  5. ESRD - On HD - MWF schedule. Nephrology following.   6. Acute Hypoxic Respiratory Failure - Felt to be secondary to pulmonary edema and underwent emergent HD on 05/23/2021. Initially required BiPAP but now on RA.  7. Anemia - Hgb at 7.0 this AM (has been at 8.1 - 9.4 this admission). No reports of active bleeding.   8. NSVT - He did have 16 beats NSVT this morning. Will add-on BMET and Mg to AM labs. Remains on Coreg 6.25mg  BID.   For questions or updates, please contact [place] Please consult [email or web address] for contact info under      Signed, Erma Heritage, PA-C  05/25/2021, 8:35 AM     Attending note:  Patient seen and examined.  I reviewed the cardiology consultation note from Dr. Harrington Challenger as well as extensive prior records including his last cardiac catheterization done at [facility] in 2019.  Patient presently admitted with NSTEMI, peak high-sensitivity troponin I of 14,741 and LVEF 45% with inferolateral hypokinesis.  He has a history of multivessel CAD status post CABG in 2008 with redo operation in 2018.  Last cardiac catheterization in 2019 showed patent LIMA to LAD, patent radial to distal RCA, and patent SVG to OM1 and first diagonal.  RIMA to diagonal was noted to be occluded (placed 2018), and he otherwise had a chronically occluded SVG to PL and acute on chronic occlusion of the SVG to second diagonal which was managed medically.  Undergoing  hemodialysis on my examination, clinically stable without active angina.  He is afebrile, heart rate in the 60s to 70s in sinus rhythm by telemetry which I personally reviewed, systolic pressure [phone].  Lungs are clear.  Cardiac exam with RRR and  2/6 to 3/6 systolic murmur.  Follow-up echocardiogram during this admission shows LVEF 45% with inferolateral hypokinesis as discussed above, also moderate to severe calcific aortic stenosis, upper end of scale with paradoxically low mean gradient of 24 mmHg and dimensionless index 0.24, also mild to moderate aortic regurgitation.  Lab work shows potassium 4.1, BUN 53, creatinine 4.94, hemoglobin 7.0 down from 8.1, platelets 119.  Recent chest x-ray shows resolving pulmonary edema and trace right pleural effusion.  I personally reviewed his ECG from January 8 showing sinus rhythm with IVCD and diffuse repolarization abnormalities.  Situation reviewed with the patient.  After discussion, plan is to have him transferred to [facility] in anticipation of a follow-up diagnostic cardiac catheterization to best determine whether there are any revascularization strategies to consider.  This will also be helpful in determining whether he would ever be a candidate for TAVR with his progressive aortic stenosis.  If there are not viable revascularization options and his aortic stenosis is not felt to be treatable, can adjust medications as tolerated and consider possibility of palliative care consultation.  He is currently on aspirin, Plavix, heparin, Coreg, and Lipitor.  To receive PRBCs with hemodialysis today, no obvious acute bleeding source and he is chronically anemic as well.  Recheck lab work tomorrow, he is tentatively on the cardiac catheterization schedule for tomorrow.  Satira Sark, M.D., F.A.C.C.

## 2021-05-25 NOTE — Progress Notes (Signed)
Report called to Nate , RN on [facility]

## 2021-05-25 NOTE — Progress Notes (Addendum)
ANTICOAGULATION CONSULT NOTE -   Pharmacy Consult for heparin Indication: chest pain/ACS  Allergies  Allergen Reactions   Cardizem Cd [Diltiazem Hcl Er Beads] Palpitations    "Makes heart skip"   Cardizem [Diltiazem]     Patient Measurements: Height: 5\' 9"  (175.3 cm) Weight: 83 kg (182 lb 15.7 oz) IBW/kg (Calculated) : 70.7  Vital Signs: Temp: 97.6 F (36.4 C) (01/10 0930) Temp Source: Oral (01/10 0930) BP: 140/35 (01/10 1230) Pulse Rate: 71 (01/10 1230)  Labs: Recent Labs    05/23/21 0217 05/23/21 0417 05/23/21 2302 05/24/21 0116 05/24/21 0452 05/24/21 0718 05/25/21 0157 05/25/21 0513 05/25/21 1102  HGB 8.3*  --   --  8.1*  --   --   --  7.0*  --   HCT 25.5*  --   --  25.3*  --   --   --  21.0*  --   PLT 125*  --   --  138*  --   --   --  119*  --   HEPARINUNFRC  --    < >  --   --   --    < > 0.25* 0.32 0.37  CREATININE 4.90*  --   --  2.95*  --   --   --  4.94*  --   TROPONINIHS 505*   < > 11,751* 14,741* 12,806*  --   --   --   --    < > = values in this interval not displayed.     Estimated Creatinine Clearance: 13.1 mL/min (A) (by C-G formula based on SCr of 4.94 mg/dL (H)).   Medical History: Past Medical History:  Diagnosis Date   Acute myocardial infarction of other inferior wall, initial episode of care    Anemia    Anginal pain ([place])    Anxiety    Arthritis    Cancer ([place])    bladder 2013 removed, has cystoscopy 10/2016, has returned x 2   COPD (chronic obstructive pulmonary disease) ([place])    Coronary artery disease    Artery bypass graft Jan 2008   Dialysis patient [facility])    Dyspnea    Dysrhythmia    GERD (gastroesophageal reflux disease)    Heart murmur    History of hiatal hernia    Hyperlipidemia    Hypertension    Hypothyroidism    PONV (postoperative nausea and vomiting)    Renal disorder    Sleep apnea    Stroke ([place])    Tobacco user     Assessment: 76yo male c/o "heartburn" associated with diaphoresis but on arrival to ED  ECG reveals global ischemia >> troponin elevated >> to begin heparin.  HL 0.37- therapeutic   Goal of Therapy:  Heparin level 0.3-0.7 units/ml Monitor platelets by anticoagulation protocol: Yes   Plan:  Continue heparin infusion at 1750 units/hr. Monitor heparin levels daily and CBC.  Margot Ables, PharmD Clinical Pharmacist 05/25/2021 12:37 PM

## 2021-05-25 NOTE — Progress Notes (Signed)
PROGRESS NOTE    [name]  DQQ:[id number] DOB: Aug 29, 1945 DOA: 05/23/2021 PCP: [facility]    Chief Complaint  Patient presents with   Chest Pain    Brief Narrative:  [name] is a 76 y.o. male with medical history significant of end-stage renal disease on hemodialysis (Monday, Wednesday, Friday); history of bladder cancer diagnosed in 2013 actively followed by urologist at [facility] (last visit documented on epic suggesting remission), history of BPH, hypertension, hyperlipidemia, cholelithiasis/biliary colic, hypothyroidism, gastroesophageal flux disease, history of coronary artery disease status post CABG and chronic diastolic heart failure; who presented to the hospital secondary to chest pain and heartburn. Pain is localized in the mid chest/epigastric region, non-radiated, 8/10 in intensity, constant and associated with sweating/diaphoresis and SOB. Patient also reported that the pain is worse with exertion and got relieved by the use of nitroglycerin.   Patient is now vaccinated against COVID; COVID PCR in the ER negative.   ED Course: Work-up has demonstrated significant ascending elevation in his troponin ([id number]), chest x-ray demonstrating vascular congestion and increased lung bases markings, no acute infiltrates; patient is afebrile, with a stable blood pressure and normal WBCs.  EKG demonstrated sinus tachycardia with abnormal repolarization and concerns for global ischemia. (New findings since December).  Case was discussed with cardiology service on call who recommended initiation of heparin drip as part of treatment for NSTEMI.  Further discussion with on-call cardiology today's morning ([name]) has trigger the patient is not a candidate for cardiac cath and to focus on medical management.  As he was a still experiencing discomfort nitroglycerin drip was also started and to alleviate complaining of shortness of breath patient was placed on BiPAP.   TRH has been consulted to place in the hospital for further evaluation and management.  Assessment & Plan: 1-NSTEMI -Continue medical management -So far tolerating well Heparin drip; currently denies chest pain. -Plan is to continue heparin drip; patient is chest pain-free and no complaining of shortness of breath at this time. -off NTG drip -further discussing with cardiology service after results of echo available has triggered transfer to [facility] for heart cath and further evaluation of his aortic stenosis. -For now will continue aspirin, statin and beta-blocker. -Will follow any further recommendation by cardiology service.  2-acute respiratory failure with hypoxia/shortness of breath -In the setting of pulmonary edema/vascular congestion. -Status post emergent hemodialysis on 05/23/2021; currently no requiring oxygen supplementation or BiPAP of initially needed. -Continue follow-up recommendations by nephrology service for further adjustments in his dry weight and volume control. -3.9 L removed with dialysis treatment on 05/23/21 -Continue treatment for NSTEMI as mentioned in problem 1. -Patient will have dialysis again today  3-hypertension -Stable below -Continue current antihypertensive agents.  4-gastroesophageal flux disease -Continue PPI.  5-anemia of chronic kidney disease -Hemoglobin down to 7.0 -IV iron and Epogen therapy as per nephrology service recommendations.   -Threshold for transfusion given history of coronary artery disease is hemoglobin less than 7.5. -No signs of overt bleeding -Will transfuse 1 unit PRBCs.  6-history of BPH -Continue Flomax.  7-hypothyroidism -Continue Synthroid.  8-end-stage renal disease -Continue to follow nephrology recommendations -Emergent hemodialysis on 05/23/2021; plan is for dialysis again later today on 05/25/2021 and depending on volume status and further needs probably dialysis on 05/26/2021 to put him back on schedule. -Will  follow further advice regarding adjustment on his dry weight by nephrology service.  9-DNR/DNI -Patient with significant comorbidities and high risk for decompensation -Extensive discussion with  patient and wife at bedside; he understand his situation and has confusion and DNR/DNI status; continue aggressive treatment but no intubation, chest compressions or shocking. -Okay to use BiPAP if needed.  DVT prophylaxis: Heparin drip. Code Status: DNR/DNI Family Communication: No family at bedside. Disposition:   Status is: Inpatient; given NSTEMI status and need for IV heparin drip.  Stable to transfer to telemetry bed.    Consultants:  Nephrology service Cardiology service Palliative care.  Procedures:  See below for x-ray reports Emergent hemodialysis on 05/23/2021. 2D echo: Demonstrating ejection fraction of 45% with inferolateral hypokinesis; there is also moderate to severe calcific aortic stenosis and mild to moderate aortic regurgitation findings.   Antimicrobials:  None   Subjective: No chest pain, no nausea, no vomiting, no shortness of breath.  Patient denies palpitations.  No overt bleeding.  Objective: Vitals:   05/25/21 1200 05/25/21 1230 05/25/21 1245 05/25/21 1300  BP: (!) 110/38 (!) 140/35 (!) 143/55 (!) 111/50  Pulse: 63 71 70 65  Resp: 20 20 20 20   Temp:   97.6 F (36.4 C) 97.9 F (36.6 C)  TempSrc:   Oral Oral  SpO2:      Weight:      Height:        Intake/Output Summary (Last 24 hours) at 05/25/2021 1333 Last data filed at 05/25/2021 0912 Gross per 24 hour  Intake 471.76 ml  Output 250 ml  Net 221.76 ml   Filed Weights   05/24/21 2127 05/25/21 0500 05/25/21 0930  Weight: 82.6 kg 82.6 kg 83 kg    Examination: General exam: Alert, awake, oriented x 3; no chest pain, no nausea, no vomiting.  Resting comfortable and no requiring oxygen supplementation. Respiratory system: Good air movement bilaterally; positive scattered rhonchi.  No using  accessory muscle.  Good saturation on room air. Cardiovascular system: Rate controlled, no rubs, no gallops, no JVD; positive systolic ejection murmur appreciated on exam.   Gastrointestinal system: Abdomen is nondistended, soft and nontender. No organomegaly or masses felt. Normal bowel sounds heard. Central nervous system: Alert and oriented. No focal neurological deficits. Extremities: No cyanosis or clubbing. Skin: No petechiae. Psychiatry: Judgement and insight appear normal. Mood & affect appropriate.   Data Reviewed: I have personally reviewed following labs and imaging studies  CBC: Recent Labs  Lab 05/23/21 0217 05/24/21 0116 05/25/21 0513  WBC 8.1 8.6 4.9  HGB 8.3* 8.1* 7.0*  HCT 25.5* 25.3* 21.0*  MCV 98.1 95.1 96.8  PLT 125* 138* 119*    Basic Metabolic Panel: Recent Labs  Lab 05/23/21 0217 05/24/21 0116 05/25/21 0513  NA 136 136 133*  K 4.4 3.5 4.1  CL 95* 96* 96*  CO2 29 30 26   GLUCOSE 128* 109* 93  BUN 41* 20 53*  CREATININE 4.90* 2.95* 4.94*  CALCIUM 8.8* 8.9 8.2*  MG  --   --  2.2  PHOS  --  1.8*  --     GFR: Estimated Creatinine Clearance: 13.1 mL/min (A) (by C-G formula based on SCr of 4.94 mg/dL (H)).  Liver Function Tests: Recent Labs  Lab 05/23/21 0217 05/24/21 0116  AST 18  --   ALT 12  --   ALKPHOS 93  --   BILITOT 0.8  --   PROT 7.1  --   ALBUMIN 3.3* 3.2*    CBG: No results for input(s): GLUCAP in the last 168 hours.   Recent Results (from the past 240 hour(s))  Resp Panel by RT-PCR (Flu A&B, Covid) Nasopharyngeal  Swab     Status: None   Collection Time: 05/23/21  4:12 AM   Specimen: Nasopharyngeal Swab; Nasopharyngeal(NP) swabs in vial transport medium  Result Value Ref Range Status   SARS Coronavirus 2 by RT PCR NEGATIVE NEGATIVE Final    Comment: (NOTE) SARS-CoV-2 target nucleic acids are NOT DETECTED.  The SARS-CoV-2 RNA is generally detectable in upper respiratory specimens during the acute phase of infection. The  lowest concentration of SARS-CoV-2 viral copies this assay can detect is 138 copies/mL. A negative result does not preclude SARS-Cov-2 infection and should not be used as the sole basis for treatment or other patient management decisions. A negative result may occur with  improper specimen collection/handling, submission of specimen other than nasopharyngeal swab, presence of viral mutation(s) within the areas targeted by this assay, and inadequate number of viral copies(<138 copies/mL). A negative result must be combined with clinical observations, patient history, and epidemiological information. The expected result is Negative.  Fact Sheet for Patients:  EntrepreneurPulse.com.au  Fact Sheet for Healthcare Providers:  IncredibleEmployment.be  This test is no t yet approved or cleared by the [place] FDA and  has been authorized for detection and/or diagnosis of SARS-CoV-2 by FDA under an Emergency Use Authorization (EUA). This EUA will remain  in effect (meaning this test can be used) for the duration of the COVID-19 declaration under Section 564(b)(1) of the Act, 21 U.S.C.section 360bbb-3(b)(1), unless the authorization is terminated  or revoked sooner.       Influenza A by PCR NEGATIVE NEGATIVE Final   Influenza B by PCR NEGATIVE NEGATIVE Final    Comment: (NOTE) The Xpert Xpress SARS-CoV-2/FLU/RSV plus assay is intended as an aid in the diagnosis of influenza from Nasopharyngeal swab specimens and should not be used as a sole basis for treatment. Nasal washings and aspirates are unacceptable for Xpert Xpress SARS-CoV-2/FLU/RSV testing.  Fact Sheet for Patients: EntrepreneurPulse.com.au  Fact Sheet for Healthcare Providers: IncredibleEmployment.be  This test is not yet approved or cleared by the [place] FDA and has been authorized for detection and/or diagnosis of SARS-CoV-2 by FDA under  an Emergency Use Authorization (EUA). This EUA will remain in effect (meaning this test can be used) for the duration of the COVID-19 declaration under Section 564(b)(1) of the Act, 21 U.S.C. section 360bbb-3(b)(1), unless the authorization is terminated or revoked.  Performed at [facility], [address] [place], [place] [address]   MRSA Next Gen by PCR, Nasal     Status: None   Collection Time: 05/23/21  1:06 PM   Specimen: Nasal Mucosa; Nasal Swab  Result Value Ref Range Status   MRSA by PCR Next Gen NOT DETECTED NOT DETECTED Final    Comment: (NOTE) The GeneXpert MRSA Assay (FDA approved for NASAL specimens only), is one component of a comprehensive MRSA colonization surveillance program. It is not intended to diagnose MRSA infection nor to guide or monitor treatment for MRSA infections. Test performance is not FDA approved in patients less than 15 years old. Performed at [facility], [address] [place],  [address]      Radiology Studies: DG CHEST PORT 1 VIEW  Result Date: 05/24/2021 CLINICAL DATA:  76 year old male with history of end-stage renal disease and bladder cancer presenting with shortness of breath. EXAM: PORTABLE CHEST 1 VIEW COMPARISON:  Chest x-ray 05/23/2021. FINDINGS: Lung volumes are low. No acute consolidative airspace disease. Trace right pleural effusion. No left pleural effusion. No pneumothorax. Mild diffuse peribronchial cuffing and interstitial prominence.  Cephalization of the pulmonary vasculature noted on the prior examination has resolved. Heart size is upper limits of normal. Upper mediastinal contours are within normal limits. Atherosclerotic calcifications are noted in the thoracic aorta. Status post median sternotomy for CABG. IMPRESSION: 1. The appearance of the chest suggests resolving pulmonary edema, as above. 2. Trace right pleural effusion. 3. Aortic atherosclerosis. Electronically Signed   By: Vinnie Langton M.D.   On: 05/24/2021  05:24   ECHOCARDIOGRAM COMPLETE  Result Date: 05/24/2021    ECHOCARDIOGRAM REPORT   Patient Name:   [name] Date of Exam: 05/24/2021 Medical Rec #:  [id number]      Height:       69.0 in Accession #:    [id number]     Weight:       181.9 lb Date of Birth:  12/16/1945      BSA:          1.984 m Patient Age:    7 years       BP:           141/28 mmHg Patient Gender: M              HR:           76 bpm. Exam Location:  Inpatient Procedure: 2D Echo, 3D Echo, Cardiac Doppler and Color Doppler                                MODIFIED REPORT:      This report was modified by Dorris Carnes MD on 05/24/2021 due to complete                                   impression.  Indications:     NSTEMI I21.4  History:         Patient has prior history of Echocardiogram examinations, most                  recent 04/06/2021. Previous Myocardial Infarction and CAD,                  Prior CABG, COPD, Aortic Valve Disease; Risk                  Factors:Dyslipidemia and Hypertension.  Sonographer:     Merrie Roof RDCS Referring Phys:  [place] Diagnosing Phys: Dorris Carnes MD IMPRESSIONS  1. Inferolateral hypokinesis.. Left ventricular ejection fraction, by estimation, is 45%%. The left ventricular internal cavity size was moderately dilated. There is mild left ventricular hypertrophy. Left ventricular diastolic parameters are consistent  with Grade II diastolic dysfunction (pseudonormalization). Elevated left atrial pressure.  2. Right ventricular systolic function is normal. The right ventricular size is normal. There is moderately elevated pulmonary artery systolic pressure.  3. Left atrial size was moderately dilated.  4. Right atrial size was mildly dilated.  5. Mild to moderate mitral valve regurgitation.  6. AV is thickened, calcified with restricted motion. Peak and mean gradients through the valve are 41 and 24 mm Hg respectively. AVA (VTI) is 0.92 cm2 Dimensionless index is 0.24 consistent with severe AS. Note SVI is  38, lower end of normal.. Aortic valve regurgitation is mild to moderate.  7. The inferior vena cava is normal in size with greater than 50% respiratory variability, suggesting right atrial pressure of 3 mmHg. FINDINGS  Left Ventricle: Inferolateral hypokinesis. Left ventricular ejection fraction, by estimation, is 45%%. The left ventricular internal cavity size was moderately dilated. There is mild left ventricular hypertrophy. Left ventricular diastolic parameters are consistent with Grade II diastolic dysfunction (pseudonormalization). Elevated left atrial pressure. Right Ventricle: The right ventricular size is normal. Right vetricular wall thickness was not assessed. Right ventricular systolic function is normal. There is moderately elevated pulmonary artery systolic pressure. The tricuspid regurgitant velocity is  3.38 m/s, and with an assumed right atrial pressure of 3 mmHg, the estimated right ventricular systolic pressure is 45.4 mmHg. Left Atrium: Left atrial size was moderately dilated. Right Atrium: Right atrial size was mildly dilated. Pericardium: There is no evidence of pericardial effusion. Mitral Valve: There is mild thickening of the mitral valve leaflet(s). Mild to moderate mitral annular calcification. Mild to moderate mitral valve regurgitation. Tricuspid Valve: The tricuspid valve is normal in structure. Tricuspid valve regurgitation is mild. Aortic Valve: AV is thickened, calcified with restricted motion. Peak and mean gradients through the valve are 41 and 24 mm Hg respectively. AVA (VTI) is 0.92 cm2 Dimensionless index is 0.24 consistent with severe AS. Note SVI is 38, lower end of normal.  Aortic valve regurgitation is mild to moderate. Aortic regurgitation PHT measures 230 msec. Aortic valve mean gradient measures 23.5 mmHg. Aortic valve peak gradient measures 41.9 mmHg. Aortic valve area, by VTI measures 0.90 cm. Pulmonic Valve: The pulmonic valve was normal in structure. Pulmonic valve  regurgitation is mild. Aorta: The aortic root and ascending aorta are structurally normal, with no evidence of dilitation. Venous: The inferior vena cava is normal in size with greater than 50% respiratory variability, suggesting right atrial pressure of 3 mmHg. IAS/Shunts: No atrial level shunt detected by color flow Doppler.  LEFT VENTRICLE PLAX 2D LVIDd:         5.70 cm      Diastology LVIDs:         4.20 cm      LV e' medial:    3.15 cm/s LV PW:         1.20 cm      LV E/e' medial:  35.6 LV IVS:        1.10 cm      LV e' lateral:   5.66 cm/s LVOT diam:     2.20 cm      LV E/e' lateral: 19.8 LV SV:         75 LV SV Index:   38 LVOT Area:     3.80 cm                              3D Volume EF: LV Volumes (MOD)            3D EF:        44 % LV vol d, MOD A2C: 189.0 ml LV EDV:       252 ml LV vol d, MOD A4C: 197.0 ml LV ESV:       141 ml LV vol s, MOD A2C: 117.0 ml LV SV:        111 ml LV vol s, MOD A4C: 120.0 ml LV SV MOD A2C:     72.0 ml LV SV MOD A4C:     197.0 ml LV SV MOD BP:      73.4 ml RIGHT VENTRICLE            IVC RV Basal diam:  3.90 cm    IVC diam: 1.60 cm RV S prime:     7.95 cm/s TAPSE (M-mode): 2.6 cm LEFT ATRIUM            Index        RIGHT ATRIUM           Index LA diam:      5.10 cm  2.57 cm/m   RA Area:     20.50 cm LA Vol (A2C): 108.0 ml 54.43 ml/m  RA Volume:   63.90 ml  32.20 ml/m  AORTIC VALVE AV Area (Vmax):    1.00 cm AV Area (Vmean):   0.96 cm AV Area (VTI):     0.90 cm AV Vmax:           323.50 cm/s AV Vmean:          232.000 cm/s AV VTI:            0.835 m AV Peak Grad:      41.9 mmHg AV Mean Grad:      23.5 mmHg LVOT Vmax:         85.30 cm/s LVOT Vmean:        58.300 cm/s LVOT VTI:          0.198 m LVOT/AV VTI ratio: 0.24 AI PHT:            230 msec  AORTA Ao Root diam: 3.50 cm Ao Asc diam:  3.20 cm MITRAL VALVE                TRICUSPID VALVE MV Area (PHT): 4.49 cm     TR Peak grad:   45.7 mmHg MV Decel Time: 169 msec     TR Vmax:        338.00 cm/s MV E velocity: 112.00 cm/s MV  A velocity: 84.80 cm/s   SHUNTS MV E/A ratio:  1.32         Systemic VTI:  0.20 m                             Systemic Diam: 2.20 cm Dorris Carnes MD Electronically signed by Dorris Carnes MD Signature Date/Time: 05/24/2021/4:07:58 PM    Final (Updated)      Scheduled Meds:  sodium chloride   Intravenous Once   aspirin EC  81 mg Oral Daily   atorvastatin  80 mg Oral Daily   carvedilol  6.25 mg Oral BID   Chlorhexidine Gluconate Cloth  6 each Topical Daily   clopidogrel  75 mg Oral Daily   levothyroxine  50 mcg Oral QAC breakfast   pantoprazole  40 mg Oral Daily   sodium chloride flush  3 mL Intravenous Q12H   Continuous Infusions:  sodium chloride     sodium chloride     sodium chloride     heparin 1,750 Units/hr (05/25/21 1249)   nitroGLYCERIN Stopped (05/23/21 1950)     LOS: 2 days    To contact the attending provider between 7A-7P or the covering provider during after hours 7P-7A, please log into the web site [email or web address] and access using universal [place] password for that web site. If you do not have the password, please call the hospital operator.  05/25/2021, 1:33 PM

## 2021-05-26 ENCOUNTER — Ambulatory Visit (HOSPITAL_COMMUNITY): Admit: 2021-05-26 | Payer: No Typology Code available for payment source | Admitting: Cardiovascular Disease

## 2021-05-26 DIAGNOSIS — Z8551 Personal history of malignant neoplasm of bladder: Secondary | ICD-10-CM

## 2021-05-26 DIAGNOSIS — Z951 Presence of aortocoronary bypass graft: Secondary | ICD-10-CM

## 2021-05-26 DIAGNOSIS — I10 Essential (primary) hypertension: Secondary | ICD-10-CM

## 2021-05-26 DIAGNOSIS — E782 Mixed hyperlipidemia: Secondary | ICD-10-CM

## 2021-05-26 DIAGNOSIS — E039 Hypothyroidism, unspecified: Secondary | ICD-10-CM

## 2021-05-26 DIAGNOSIS — I350 Nonrheumatic aortic (valve) stenosis: Secondary | ICD-10-CM

## 2021-05-26 DIAGNOSIS — Z992 Dependence on renal dialysis: Secondary | ICD-10-CM

## 2021-05-26 DIAGNOSIS — Z66 Do not resuscitate: Secondary | ICD-10-CM

## 2021-05-26 LAB — RENAL FUNCTION PANEL
Albumin: 2.8 g/dL — ABNORMAL LOW (ref 3.5–5.0)
Anion gap: 9 (ref 5–15)
BUN: 41 mg/dL — ABNORMAL HIGH (ref 8–23)
CO2: 27 mmol/L (ref 22–32)
Calcium: 8.7 mg/dL — ABNORMAL LOW (ref 8.9–10.3)
Chloride: 96 mmol/L — ABNORMAL LOW (ref 98–111)
Creatinine, Ser: 4.9 mg/dL — ABNORMAL HIGH (ref 0.61–1.24)
GFR, Estimated: 12 mL/min — ABNORMAL LOW (ref >60)
Glucose, Bld: 105 mg/dL — ABNORMAL HIGH (ref 70–99)
Phosphorus: 1.5 mg/dL — ABNORMAL LOW (ref 2.5–4.6)
Potassium: 4.7 mmol/L (ref 3.5–5.1)
Sodium: 132 mmol/L — ABNORMAL LOW (ref 135–145)

## 2021-05-26 LAB — CBC
HCT: 24.4 % — ABNORMAL LOW (ref 39.0–52.0)
Hemoglobin: 8.1 g/dL — ABNORMAL LOW (ref 13.0–17.0)
MCH: 31.3 pg (ref 26.0–34.0)
MCHC: 33.2 g/dL (ref 30.0–36.0)
MCV: 94.2 fL (ref 80.0–100.0)
Platelets: 127 10*3/uL — ABNORMAL LOW (ref 150–400)
RBC: 2.59 MIL/uL — ABNORMAL LOW (ref 4.22–5.81)
RDW: 15.2 % (ref 11.5–15.5)
WBC: 5.2 10*3/uL (ref 4.0–10.5)
nRBC: 0 % (ref 0.0–0.2)

## 2021-05-26 LAB — HEPATITIS B SURFACE ANTIGEN: Hepatitis B Surface Ag: NONREACTIVE

## 2021-05-26 LAB — TYPE AND SCREEN
ABO/RH(D): A POS
Antibody Screen: NEGATIVE
Unit division: 0

## 2021-05-26 LAB — HEPARIN LEVEL (UNFRACTIONATED): Heparin Unfractionated: 0.44 IU/mL (ref 0.30–0.70)

## 2021-05-26 LAB — BPAM RBC
Blood Product Expiration Date: 202301232359
ISSUE DATE / TIME: 202301101237
Unit Type and Rh: 6200

## 2021-05-26 LAB — HEPATITIS B SURFACE ANTIBODY,QUALITATIVE: Hep B S Ab: NONREACTIVE

## 2021-05-26 MED ORDER — ALUM & MAG HYDROXIDE-SIMETH 200-200-20 MG/5ML PO SUSP
30.0000 mL | Freq: Once | ORAL | Status: AC
  Administered 2021-05-26: 30 mL via ORAL
  Filled 2021-05-26: qty 30

## 2021-05-26 NOTE — Progress Notes (Signed)
Mobility Specialist Progress Note:   05/26/21 1327  Mobility  Activity Ambulated in hall  Level of Assistance Standby assist, set-up cues, supervision of patient - no hands on  Assistive Device  (IV pole)  Distance Ambulated (ft) 400 ft  Mobility Ambulated with assistance in hallway  Mobility Response Tolerated well  Mobility performed by Mobility specialist  Bed Position Chair  $Mobility charge 1 Mobility   Pt received chair willing to participate in mobility. No complaints of pain and asymptomatic. Pt left in chair with call bell in reach and all needs met.   [facility] Public librarian Phone [phone] Secondary Phone [phone]

## 2021-05-26 NOTE — TOC Progression Note (Signed)
Transition of Care [facility]) - Progression Note    Patient Details  Name: RICO MASSAR MRN: [id number] Date of Birth: 11/02/1945  Transition of Care [facility]) CM/SW Contact  Zenon Mayo, RN Phone Number: 05/26/2021, 9:48 AM  Clinical Narrative:     Transition of Care [facility]) Screening Note   Patient Details  Name: KAIVEN VESTER Date of Birth: 09/30/1945   Transition of Care [facility]) CM/SW Contact:    Zenon Mayo, RN Phone Number: 05/26/2021, 9:49 AM    Transition of Care Department [facility]) has reviewed patient and no TOC needs have been identified at this time. We will continue to monitor patient advancement through interdisciplinary progression rounds. If new patient transition needs arise, please place a TOC consult.      Barriers to Discharge: Continued Medical Work up  Expected Discharge Plan and Services                                                 Social Determinants of Health (SDOH) Interventions    Readmission Risk Interventions Readmission Risk Prevention Plan 04/07/2021 08/09/2020  Transportation Screening Complete Complete  Medication Review Press photographer) Complete Complete  HRI or [place] Complete Complete  SW Recovery Care/Counseling Consult Complete Complete  Palliative Care Screening Not Applicable Not [place] Not Applicable Not Applicable  Some recent data might be hidden

## 2021-05-26 NOTE — Consult Note (Signed)
° °  [facility] Inpatient Consult   05/26/2021  TRISTYN DEMAREST [birth year] [id number]  [place] Organization [ACO] Patient: [employer]  Primary Care Provider:  Clinic, [place]   Patient was assessed for [facility] for community services. Patient was previously active with [facility]..  Met with the patient at the bedside, sitting up in recliner,  regarding being restarted with [facility] services. Patient introduced for out reach, patient denies any issues, stating "I don't foresee any needs right now as I have all of my care provided by the [facility]]." Patient confirms HIPPA and no additional needs assessed for follow up. Patient was reviewed for extreme high risk score with less than 30 days unplanned readmission.  Patient was also transferred from [facility] to [facility] for cardiac workup. Reviewed Palliative Consult and mobility tech notes.  Patient states he has been independent.  Plan:  No follow up needs assessed. Encouraged patient to follow up if needs arises.  He verbalized understanding and that he would. Said information was at his home.  Of note, [facility] Care Management services does not replace or interfere with any services that are arranged by inpatient [facility] care management team.   For additional questions or referrals please contact:   Natividad Brood, RN BSN [facility] Liaison  [phone] business mobile phone Toll free office [phone]  Fax number: [phone] [email or web address]

## 2021-05-26 NOTE — Progress Notes (Signed)
Progress Note  Patient Name: [name] Date of Encounter: 05/26/2021  Primary Cardiologist: Carlyle Dolly, MD   Subjective   Patient was seen and examined at his bedside.   Inpatient Medications    Scheduled Meds:  sodium chloride   Intravenous Once   aspirin EC  81 mg Oral Daily   atorvastatin  80 mg Oral Daily   carvedilol  6.25 mg Oral BID   Chlorhexidine Gluconate Cloth  6 each Topical Daily   clopidogrel  75 mg Oral Daily   levothyroxine  50 mcg Oral QAC breakfast   pantoprazole  40 mg Oral BID   sodium chloride flush  3 mL Intravenous Q12H   sodium chloride flush  3 mL Intravenous Q12H   Continuous Infusions:  sodium chloride     sodium chloride     sodium chloride     sodium chloride     sodium chloride 10 mL/hr at 05/26/21 0419   heparin 1,750 Units/hr (05/26/21 0247)   nitroGLYCERIN Stopped (05/23/21 1950)   PRN Meds: sodium chloride, sodium chloride, sodium chloride, sodium chloride, acetaminophen **OR** acetaminophen, HYDROmorphone (DILAUDID) injection, lidocaine (PF), lidocaine-prilocaine, nitroGLYCERIN, ondansetron **OR** ondansetron (ZOFRAN) IV, pentafluoroprop-tetrafluoroeth, sodium chloride flush, sodium chloride flush   Vital Signs    Vitals:   05/25/21 2037 05/26/21 0029 05/26/21 0413 05/26/21 0733  BP: 118/67 (!) 110/46 (!) 107/56   Pulse:  69    Resp: 18 17 17    Temp: (!) 97.4 F (36.3 C) 98.4 F (36.9 C) 97.8 F (36.6 C) 97.6 F (36.4 C)  TempSrc: Oral Oral Oral Oral  SpO2: 92% 94% 96% 95%  Weight:   79.8 kg   Height:        Intake/Output Summary (Last 24 hours) at 05/26/2021 0853 Last data filed at 05/26/2021 0400 Gross per 24 hour  Intake 1705.83 ml  Output 2000 ml  Net -294.17 ml   Filed Weights   05/25/21 0500 05/25/21 0930 05/26/21 0413  Weight: 82.6 kg 83 kg 79.8 kg    Telemetry    Sinus rhythm - Personally Reviewed  ECG    None today - Personally Reviewed  Physical Exam    General: Comfortable, sitting  up in bed Head: Atraumatic, normal size  Eyes: PEERLA, EOMI  Neck: Supple, normal JVD Cardiac: Normal S1, S2; RRR; 2/6 mid systolic ejection murmurs, rubs, or gallops Lungs: Clear to auscultation bilaterally Abd: Soft, nontender, no hepatomegaly  Ext: warm, no edema Musculoskeletal: No deformities, BUE and BLE strength normal and equal Skin: Warm and dry, no rashes   Neuro: Alert and oriented to person, place, time, and situation, CNII-XII grossly intact, no focal deficits  Psych: Normal mood and affect   Labs    Chemistry Recent Labs  Lab 05/23/21 0217 05/24/21 0116 05/25/21 0513  NA 136 136 133*  K 4.4 3.5 4.1  CL 95* 96* 96*  CO2 29 30 26   GLUCOSE 128* 109* 93  BUN 41* 20 53*  CREATININE 4.90* 2.95* 4.94*  CALCIUM 8.8* 8.9 8.2*  PROT 7.1  --   --   ALBUMIN 3.3* 3.2*  --   AST 18  --   --   ALT 12  --   --   ALKPHOS 93  --   --   BILITOT 0.8  --   --   GFRNONAA 12* 22* 12*  ANIONGAP 12 10 11      Hematology Recent Labs  Lab 05/24/21 0116 05/25/21 0513 05/26/21 0344  WBC 8.6 4.9  5.2  RBC 2.66* 2.17* 2.59*  HGB 8.1* 7.0* 8.1*  HCT 25.3* 21.0* 24.4*  MCV 95.1 96.8 94.2  MCH 30.5 32.3 31.3  MCHC 32.0 33.3 33.2  RDW 14.9 15.1 15.2  PLT 138* 119* 127*    Cardiac EnzymesNo results for input(s): TROPONINI in the last 168 hours. No results for input(s): TROPIPOC in the last 168 hours.   BNP Recent Labs  Lab 05/23/21 0417  BNP 3,900.0*     DDimer No results for input(s): DDIMER in the last 168 hours.   Radiology    ECHOCARDIOGRAM COMPLETE  Result Date: 05/24/2021    ECHOCARDIOGRAM REPORT   Patient Name:   [name] Date of Exam: 05/24/2021 Medical Rec #:  [id number]      Height:       69.0 in Accession #:    [id number]     Weight:       181.9 lb Date of Birth:  04-10-1946      BSA:          1.984 m Patient Age:    76 years       BP:           141/28 mmHg Patient Gender: M              HR:           76 bpm. Exam Location:  Inpatient Procedure: 2D Echo, 3D  Echo, Cardiac Doppler and Color Doppler                                MODIFIED REPORT:      This report was modified by Dorris Carnes MD on 05/24/2021 due to complete                                   impression.  Indications:     NSTEMI I21.4  History:         Patient has prior history of Echocardiogram examinations, most                  recent 04/06/2021. Previous Myocardial Infarction and CAD,                  Prior CABG, COPD, Aortic Valve Disease; Risk                  Factors:Dyslipidemia and Hypertension.  Sonographer:     Merrie Roof RDCS Referring Phys:  [place] Diagnosing Phys: Dorris Carnes MD IMPRESSIONS  1. Inferolateral hypokinesis.. Left ventricular ejection fraction, by estimation, is 45%%. The left ventricular internal cavity size was moderately dilated. There is mild left ventricular hypertrophy. Left ventricular diastolic parameters are consistent  with Grade II diastolic dysfunction (pseudonormalization). Elevated left atrial pressure.  2. Right ventricular systolic function is normal. The right ventricular size is normal. There is moderately elevated pulmonary artery systolic pressure.  3. Left atrial size was moderately dilated.  4. Right atrial size was mildly dilated.  5. Mild to moderate mitral valve regurgitation.  6. AV is thickened, calcified with restricted motion. Peak and mean gradients through the valve are 41 and 24 mm Hg respectively. AVA (VTI) is 0.92 cm2 Dimensionless index is 0.24 consistent with severe AS. Note SVI is 38, lower end of normal.. Aortic valve regurgitation is mild to moderate.  7. The inferior vena  cava is normal in size with greater than 50% respiratory variability, suggesting right atrial pressure of 3 mmHg. FINDINGS  Left Ventricle: Inferolateral hypokinesis. Left ventricular ejection fraction, by estimation, is 45%%. The left ventricular internal cavity size was moderately dilated. There is mild left ventricular hypertrophy. Left ventricular diastolic  parameters are consistent with Grade II diastolic dysfunction (pseudonormalization). Elevated left atrial pressure. Right Ventricle: The right ventricular size is normal. Right vetricular wall thickness was not assessed. Right ventricular systolic function is normal. There is moderately elevated pulmonary artery systolic pressure. The tricuspid regurgitant velocity is  3.38 m/s, and with an assumed right atrial pressure of 3 mmHg, the estimated right ventricular systolic pressure is 37.8 mmHg. Left Atrium: Left atrial size was moderately dilated. Right Atrium: Right atrial size was mildly dilated. Pericardium: There is no evidence of pericardial effusion. Mitral Valve: There is mild thickening of the mitral valve leaflet(s). Mild to moderate mitral annular calcification. Mild to moderate mitral valve regurgitation. Tricuspid Valve: The tricuspid valve is normal in structure. Tricuspid valve regurgitation is mild. Aortic Valve: AV is thickened, calcified with restricted motion. Peak and mean gradients through the valve are 41 and 24 mm Hg respectively. AVA (VTI) is 0.92 cm2 Dimensionless index is 0.24 consistent with severe AS. Note SVI is 38, lower end of normal.  Aortic valve regurgitation is mild to moderate. Aortic regurgitation PHT measures 230 msec. Aortic valve mean gradient measures 23.5 mmHg. Aortic valve peak gradient measures 41.9 mmHg. Aortic valve area, by VTI measures 0.90 cm. Pulmonic Valve: The pulmonic valve was normal in structure. Pulmonic valve regurgitation is mild. Aorta: The aortic root and ascending aorta are structurally normal, with no evidence of dilitation. Venous: The inferior vena cava is normal in size with greater than 50% respiratory variability, suggesting right atrial pressure of 3 mmHg. IAS/Shunts: No atrial level shunt detected by color flow Doppler.  LEFT VENTRICLE PLAX 2D LVIDd:         5.70 cm      Diastology LVIDs:         4.20 cm      LV e' medial:    3.15 cm/s LV PW:          1.20 cm      LV E/e' medial:  35.6 LV IVS:        1.10 cm      LV e' lateral:   5.66 cm/s LVOT diam:     2.20 cm      LV E/e' lateral: 19.8 LV SV:         75 LV SV Index:   38 LVOT Area:     3.80 cm                              3D Volume EF: LV Volumes (MOD)            3D EF:        44 % LV vol d, MOD A2C: 189.0 ml LV EDV:       252 ml LV vol d, MOD A4C: 197.0 ml LV ESV:       141 ml LV vol s, MOD A2C: 117.0 ml LV SV:        111 ml LV vol s, MOD A4C: 120.0 ml LV SV MOD A2C:     72.0 ml LV SV MOD A4C:     197.0 ml LV SV MOD BP:  73.4 ml RIGHT VENTRICLE            IVC RV Basal diam:  3.90 cm    IVC diam: 1.60 cm RV S prime:     7.95 cm/s TAPSE (M-mode): 2.6 cm LEFT ATRIUM            Index        RIGHT ATRIUM           Index LA diam:      5.10 cm  2.57 cm/m   RA Area:     20.50 cm LA Vol (A2C): 108.0 ml 54.43 ml/m  RA Volume:   63.90 ml  32.20 ml/m  AORTIC VALVE AV Area (Vmax):    1.00 cm AV Area (Vmean):   0.96 cm AV Area (VTI):     0.90 cm AV Vmax:           323.50 cm/s AV Vmean:          232.000 cm/s AV VTI:            0.835 m AV Peak Grad:      41.9 mmHg AV Mean Grad:      23.5 mmHg LVOT Vmax:         85.30 cm/s LVOT Vmean:        58.300 cm/s LVOT VTI:          0.198 m LVOT/AV VTI ratio: 0.24 AI PHT:            230 msec  AORTA Ao Root diam: 3.50 cm Ao Asc diam:  3.20 cm MITRAL VALVE                TRICUSPID VALVE MV Area (PHT): 4.49 cm     TR Peak grad:   45.7 mmHg MV Decel Time: 169 msec     TR Vmax:        338.00 cm/s MV E velocity: 112.00 cm/s MV A velocity: 84.80 cm/s   SHUNTS MV E/A ratio:  1.32         Systemic VTI:  0.20 m                             Systemic Diam: 2.20 cm Dorris Carnes MD Electronically signed by Dorris Carnes MD Signature Date/Time: 05/24/2021/4:07:58 PM    Final (Updated)     Cardiac Studies   Cardiac Catheterization [facility]): 06/2017 Findings:  1. Severe native CAD with occlusion of the LAD at the mid-portion and 80%  proximal RCA disease  2. Patent LIMA-LAD, LRA  graft-distal RCA, SVG-OM/D1.  3. Occlusion of the RIMA mid-graft, though this was semi-engaged.  4. Diffuse plaque and acute on chronic occlusion of the SVG-D2. This is  the likely culprit vessel for  he NSTEMI but SVG is do severely diseased,  that percutaneous intervention is likely not beneficial.  5. Chronic occlusion of SVG-rPL.   Recommendations:  1. Medical Management of complex CAD and NSTEMI.     Echocardiogram: 05/24/2021 IMPRESSIONS    1. Inferolateral hypokinesis.. Left ventricular ejection fraction, by  estimation, is 45%%. The left ventricular internal cavity size was  moderately dilated. There is mild left ventricular hypertrophy. Left  ventricular diastolic parameters are consistent   with Grade II diastolic dysfunction (pseudonormalization). Elevated left  atrial pressure.   2. Right ventricular systolic function is normal. The right ventricular  size is normal. There is moderately elevated pulmonary artery systolic  pressure.  3. Left atrial size was moderately dilated.   4. Right atrial size was mildly dilated.   5. Mild to moderate mitral valve regurgitation.   6. AV is thickened, calcified with restricted motion. Peak and mean  gradients through the valve are 41 and 24 mm Hg respectively. AVA (VTI) is  0.92 cm2 Dimensionless index is 0.24 consistent with severe AS. Note SVI  is 38, lower end of normal.. Aortic  valve regurgitation is mild to moderate.   7. The inferior vena cava is normal in size with greater than 50%  respiratory variability, suggesting right atrial pressure of 3 mmHg.   Patient Profile     76 y.o. 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   Assessment & Plan    CAD s/p CABG with recent NSTEMI -his troponin peaked at 14,741 he is currently on heparin drip, pending left heart catheterization to assess need for any revascularization prior to his TAVR.  We will push this back until tomorrow to ensure that his hemoglobin is actually stable he is status post 1 unit of PRBC and has corrected from hemoglobin of 7 to hemoglobin 8.1.  Hopefully he can maintain this.  He does have anemia of chronic kidney disease with no cold bleeding at this time appreciated.  We will keep the patient n.p.o. past midnight.  For now we will continue patient on his Plavix, heparin, Coreg as well as Lipitor. Repeat hemoglobin the morning if stable for left heart catheterization  Acute Exacerbation of heart failure with reduced ejection fraction -clinically appears to be euvolemic he has been managed with hemodialysis as well  Aortic stenosis-pending TAVR work-up.  Left heart catheterization planned for tomorrow.  Acute Hypoxic respiratory failure-was noted to be in the setting of his pulmonary edema, this has resolved.  Anemia-hemoglobin 8.1 today, status post 1 unit of PRBC.  No bleeding  NSVT-continue beta-blocker   For questions or updates, please contact [place] Please consult [email or web address] for contact info under Cardiology/STEMI.      Signed, Berniece Salines, DO  05/26/2021, 8:53 AM

## 2021-05-26 NOTE — Progress Notes (Signed)
ANTICOAGULATION CONSULT NOTE -   Pharmacy Consult for heparin Indication: chest pain/ACS  Allergies  Allergen Reactions   Cardizem Cd [Diltiazem Hcl Er Beads] Palpitations    "Makes heart skip"   Cardizem [Diltiazem]     Patient Measurements: Height: 5\' 9"  (175.3 cm) Weight: 79.8 kg (175 lb 14.4 oz) IBW/kg (Calculated) : 70.7  Vital Signs: Temp: 97.6 F (36.4 C) (01/11 0733) Temp Source: Oral (01/11 0733) BP: 107/56 (01/11 0413) Pulse Rate: 69 (01/11 0029)  Labs: Recent Labs     0000 05/23/21 2302 05/24/21 0116 05/24/21 0452 05/24/21 0718 05/25/21 0513 05/25/21 1102 05/26/21 0344  HGB   < >  --  8.1*  --   --  7.0*  --  8.1*  HCT  --   --  25.3*  --   --  21.0*  --  24.4*  PLT  --   --  138*  --   --  119*  --  127*  HEPARINUNFRC  --   --   --   --    < > 0.32 0.37 0.44  CREATININE  --   --  2.95*  --   --  4.94*  --   --   TROPONINIHS  --  68,341* 14,741* 12,806*  --   --   --   --    < > = values in this interval not displayed.     Estimated Creatinine Clearance: 13.1 mL/min (A) (by C-G formula based on SCr of 4.94 mg/dL (H)).   Medical History: Past Medical History:  Diagnosis Date   Acute myocardial infarction of other inferior wall, initial episode of care    Anemia    Anginal pain ([place])    Anxiety    Arthritis    Cancer ([place])    bladder 2013 removed, has cystoscopy 10/2016, has returned x 2   COPD (chronic obstructive pulmonary disease) ([place])    Coronary artery disease    Artery bypass graft Jan 2008   Dialysis patient [facility])    Dyspnea    Dysrhythmia    GERD (gastroesophageal reflux disease)    Heart murmur    History of hiatal hernia    Hyperlipidemia    Hypertension    Hypothyroidism    PONV (postoperative nausea and vomiting)    Renal disorder    Sleep apnea    Stroke [facility])    Tobacco user     Assessment: 76yo male c/o "heartburn" associated with diaphoresis but on arrival to ED on 05/23/21 at [facility], the  ECG reveals global ischemia >>  troponin elevated >> thus pharmacy consulted to start IV heparin on 05/23/21.   1/9  Echo:  EF 45%, severe aortic stenosis. Transfer to [facility] for heart cath and further evaluation of his aortic stenosis.  05/26/21:  heparin level is 0.44,  remains therapeutic on heparin infusion at 1750 ut/hr. CBC:  hgb 8.3 on admit>8.1>7.0> transfused>8.1, pltc 125 admit> 119>127k  No bleeding reported.     Goal of Therapy:  Heparin level 0.3-0.7 units/ml Monitor platelets by anticoagulation protocol: Yes   Plan:  Continue heparin infusion at 1750 units/hr. Monitor heparin levels daily and CBC.    Thank you for allowing pharmacy to be part of this patients care team.  Nicole Cella, [place] Pharmacist [phone] 05/26/2021 8:25 AM Please check AMION for all [place] phone numbers After 10:00 PM, call [place] [phone]

## 2021-05-26 NOTE — Consult Note (Signed)
Consultation Note Date: 05/26/2021   Patient Name: [name]  DOB: 19-May-1945  MRN: [id number]  Age / Sex: 76 y.o., male  PCP: [facility] Referring Physician: Tawni Millers  Reason for Consultation: Establishing goals of care  HPI/Patient Profile: 76 y.o. male  with past medical history of ESRD on HD, bladder cancer in 2013 (remission), htn, hld, cholelithiasis, hypothyroidism, GERD, CAD s/p CABG, and CHF admitted on 05/23/2021 with chest pain and heart burn. Diagnosed with NSTEMI - plan for cardiac cath 1/12. Echo also reveals aortic stenosis. Patient with anemia and need for blood transfusion - holding off cath for 1/11 to watch hgb. Patient continues on HD throughout hosptalization. PMT consulted to discuss [place].   Clinical Assessment and Goals of Care: I have reviewed medical records including EPIC notes, labs and imaging, received report from RN, assessed the patient and then met with patient  to discuss diagnosis prognosis, GOC, EOL wishes, disposition and options.  I introduced Palliative Medicine as specialized medical care for people living with serious illness. It focuses on providing relief from the symptoms and stress of a serious illness. The goal is to improve quality of life for both the patient and the family.  We discussed a brief life review of the patient. He tells me about his medical history - specifically complications with his heart and his previous heart surgery by Dr. Cyndia Bent in 2018.   As far as functional and nutritional status, he tells me he was doing well at home. Able to care for himself independently. Tells me he ate pretty well but had lost some weight d/t limitations in appetite d/t his ongoing "heartburn".   We discussed patient's current illness and what it means in the larger context of patient's on-going co-morbidities.  Natural disease trajectory and expectations at EOL were discussed.  We discussed his CAD and aortic stenosis. We discussed his ESRD.   I attempted to elicit values and goals of care important to the patient.  Patient tells me what is most important to him is being able to independently care for himself. He does not want to feel as though he is a burden on others.   The difference between aggressive medical intervention and comfort care was considered in light of the patient's goals of care.   We briefly reviewed a MOST form and Advance directive packet. He confirms he would want his wife to make medical decisions for him if he were unable. Confirmed DNR status. We discussed that some of his decisions moving forward may be impacted by how cardiac cath goes. We discussed following up on MOST form after his cath.   Advance directives, concepts specific to code status, artificial feeding and hydration, and rehospitalization were considered and discussed.  Discussed with patient the importance of continued conversation with family and the medical providers regarding overall plan of care and treatment options, ensuring decisions are within the context of the patients values and GOCs.    Other pieces of conversation that patient wished to be included in his [place] would like his heath care providers to be "blunt" with him - he appreciates clear, accurate information He is quite concerned about receiving HD prior to discharge and wants to ensure this will be arranged Lastly, he is okay with rehab if recommended for short term but would not agree to long-term care  Questions and concerns were addressed. The family was encouraged to call with questions or concerns.   Primary Decision Maker PATIENT  Wife Sula Soda if patient unable  SUMMARY OF RECOMMENDATIONS   - introduction of advance directives - to follow up in coming days after cath - maintain DNR - patient requests his providers be "blunt" with him when providing clear, accurate information regarding his  health and medical procedures - most important goal for patient is to maintain independence - will follow  Code Status/Advance Care Planning: DNR  Discharge Planning: To Be Determined      Primary Diagnoses: Present on Admission:  NSTEMI (non-ST elevated myocardial infarction) ([place])   I have reviewed the medical record, interviewed the patient and family, and examined the patient. The following aspects are pertinent.  Past Medical History:  Diagnosis Date   Acute myocardial infarction of other inferior wall, initial episode of care    Anemia    Anginal pain ([place])    Anxiety    Arthritis    Cancer ([place])    bladder 2013 removed, has cystoscopy 10/2016, has returned x 2   COPD (chronic obstructive pulmonary disease) ([place])    Coronary artery disease    Artery bypass graft Jan 2008   Dialysis patient [facility])    Dyspnea    Dysrhythmia    GERD (gastroesophageal reflux disease)    Heart murmur    History of hiatal hernia    Hyperlipidemia    Hypertension    Hypothyroidism    PONV (postoperative nausea and vomiting)    Renal disorder    Sleep apnea    Stroke ([place])    Tobacco user    Social History   Socioeconomic History   Marital status: Married    Spouse name: Not on file   Number of children: Not on file   Years of education: Not on file   Highest education level: Not on file  Occupational History   Not on file  Tobacco Use   Smoking status: Former    Packs/day: 1.00    Years: 40.00    Pack years: 40.00    Types: Cigarettes    Start date: 02/20/1959    Quit date: 09/13/2016    Years since quitting: 4.7   Smokeless tobacco: Never  Vaping Use   Vaping Use: Never used  Substance and Sexual Activity   Alcohol use: No    Alcohol/week: 0.0 standard drinks    Comment: "No, not really"   Drug use: No   Sexual activity: Not Currently  Other Topics Concern   Not on file  Social History Narrative   Not on file   Social Determinants of Health   Financial  Resource Strain: Not on file  Food Insecurity: Not on file  Transportation Needs: Not on file  Physical Activity: Not on file  Stress: Not on file  Social Connections: Not on file   Family History  Problem Relation Age of Onset   Heart attack Mother    Heart attack Brother    Colon cancer Neg Hx    Colon polyps Neg Hx    Scheduled Meds:  sodium chloride   Intravenous Once   aspirin EC  81 mg Oral Daily   atorvastatin  80 mg Oral Daily   carvedilol  6.25 mg Oral BID   Chlorhexidine Gluconate Cloth  6 each Topical Daily   clopidogrel  75 mg Oral Daily   levothyroxine  50 mcg Oral QAC breakfast   pantoprazole  40 mg Oral BID   sodium chloride flush  3 mL Intravenous Q12H   sodium chloride flush  3  mL Intravenous Q12H   Continuous Infusions:  sodium chloride     sodium chloride     sodium chloride     sodium chloride     sodium chloride 10 mL/hr at 05/26/21 0419   heparin 1,750 Units/hr (05/26/21 0247)   nitroGLYCERIN Stopped (05/23/21 1950)   PRN Meds:.sodium chloride, sodium chloride, sodium chloride, sodium chloride, acetaminophen **OR** acetaminophen, HYDROmorphone (DILAUDID) injection, lidocaine (PF), lidocaine-prilocaine, nitroGLYCERIN, ondansetron **OR** ondansetron (ZOFRAN) IV, pentafluoroprop-tetrafluoroeth, sodium chloride flush, sodium chloride flush Allergies  Allergen Reactions   Cardizem Cd [Diltiazem Hcl Er Beads] Palpitations    "Makes heart skip"   Cardizem [Diltiazem]     Physical Exam Constitutional:      General: He is not in acute distress. Pulmonary:     Effort: Pulmonary effort is normal.  Skin:    General: Skin is warm and dry.  Neurological:     Mental Status: He is alert and oriented to person, place, and time.    Vital Signs: BP (!) 107/56    Pulse 69    Temp 97.6 F (36.4 C) (Oral)    Resp 17    Ht 5' 9"  (1.753 m)    Wt 79.8 kg    SpO2 95%    BMI 25.98 kg/m  Pain Scale: 0-10 POSS *See Group Information*: 1-Acceptable,Awake and  alert Pain Score: 0-No pain   SpO2: SpO2: 95 % O2 Device:SpO2: 95 % O2 Flow Rate: .O2 Flow Rate (L/min): 2 L/min  IO: Intake/output summary:  Intake/Output Summary (Last 24 hours) at 05/26/2021 1100 Last data filed at 05/26/2021 0920 Gross per 24 hour  Intake 1705.83 ml  Output 2000 ml  Net -294.17 ml    LBM: Last BM Date: 05/25/21 Baseline Weight: Weight: 83.5 kg Most recent weight: Weight: 79.8 kg     Palliative Assessment/Data: PPS 60%    Time Total: 70 minutes Greater than 50%  of this time was spent counseling and coordinating care related to the above assessment and plan.  Juel Burrow, DNP, AGNP-C Palliative Medicine Team [phone] Pager: [phone]

## 2021-05-26 NOTE — Progress Notes (Signed)
PROGRESS NOTE    [name]  ZOX:[id number] DOB: [birth year] DOA: 05/23/2021 [facility]CP: [facility]    Brief Narrative:  [name] was admitted to the hospital with the working diagnosis of NSTEMI.  Transferred from [facility] to [facility].   76 yo male with the past medical history of ESRD on HD, bladder cancer 2013, BPH, HTN, dyslipidemia, hypothyroid, cholelithiasis, CAD sp CABG, and GERD who presented with chest pain. Reported burning type chest pain, mid chest, epigastric region, 8/10 in intensity, constant and associated with diaphoresis and dyspnea that prompted him to come to the hospital. Pain worse with exertion. On his initial physical examination his blood pressure was 135/55, HR 84, RR 26 to 28, oxygen saturation 95% on supplemental 02, lungs clear to auscultation, heart with S1 and S2 present and rhythmic, abdomen soft and no lower extremity edema.   Sodium 136, potassium 4.4, chloride 95, bicarb 29, glucose 128, BUN 41, creatinine 4.90, lipase 44. High sensitive troponin 505, white count 8.1, hemoglobin 8.3, hematocrit 25.5, platelets 125. SARS COVID-19 negative.  Chest radiograph with hilar vascular congestion (personally reviewed).   EKG 101 bpm, normal axis and normal intervals, sinus rhythm with PAC, J point elevation V1-V3, ST depression I-AVL, V5 and V6, lead II and III.  Patient was placed on heparin drip, and nitroglycerin drip.   Positive volume overload with acute hypoxic respiratory failure, he was placed on on invasive mechanical ventilation and underwent urgent HD 05/23/21.  Echocardiogram with inferior lateral hypokinesis, EF LV 45%, moderate cavity dilatation. RV systolic functio normal. Severe aortic stenosis.   01/10 patient was transferred to [facility] for further cardiac workup.   Assessment & Plan:   Principal Problem:   NSTEMI (non-ST elevated myocardial infarction) ([place]) Active Problems:   HYPERLIPIDEMIA, MIXED   Essential hypertension   S/P CABG x 2    History of bladder cancer   End-stage renal disease on hemodialysis (HCC)   Hypothyroidism (acquired)   Aortic stenosis   NSTEMI, sp CABGx2, severe aortic stenosis, dyslipidemia Patient has been on heparin drip. Today he is pain free. At home had progressive chest pain on exertion for 3 months and on admission presented symptoms at rest.   Plan to continue with heparin drip for now. Cardiac catheterization evaluation of coronaries and aortic valve.  Continue blood pressure control.   Continue with aspirin/ clopidogrel and atorvastatin   2. Acute pulmonary edema, with acute hypoxemic respiratory failure, volume overload ESRD on HD.  Today patient euvolemic, no dyspnea and no lower extremity edema. Continue renal replacement therapy per nephrology recommendations. Patient's regular schedule is Monday, Wednesday and Friday.   Respiratory failure has resolved.  For anemia of chronic kidney disease continue with iron and epo.   3. HTN. Continue blood pressure control with carvedilol.   4. Hypothyroid. Continue with levothyroxine    Patient continue to be at high risk for worsening ischemia   Status is: Inpatient  Remains inpatient appropriate because: IV heparin, pending further cardiac workup    DVT prophylaxis: IV heparin   Code Status:    DNR Family Communication:  no family at the bedside      Consultants:  Nephrology  Cardiology    Subjective: Patient is chest pain free, no nausea or vomiting, no dyspnea. Continue with IV heparin   Objective: Vitals:   05/26/21 0029 05/26/21 0413 05/26/21 0733 05/26/21 1128  BP: (!) 110/46 (!) 107/56  (!) 135/52  Pulse: 69   67  Resp: 17 17  20  Temp: 98.4 F (36.9 C) 97.8 F (36.6 C) 97.6 F (36.4 C) (!) 97.5 F (36.4 C)  TempSrc: Oral Oral Oral Oral  SpO2: 94% 96% 95% 96%  Weight:  79.8 kg    Height:        Intake/Output Summary (Last 24 hours) at 05/26/2021 1222 Last data filed at 05/26/2021 0920 Gross per 24 hour   Intake 1705.83 ml  Output 2000 ml  Net -294.17 ml   Filed Weights   05/25/21 0500 05/25/21 0930 05/26/21 0413  Weight: 82.6 kg 83 kg 79.8 kg    Examination:   General: Not in pain or dyspnea, deconditioned  Neurology: Awake and alert, non focal  E ENT: no pallor, no icterus, oral mucosa moist Cardiovascular: No JVD. S1-S2 present, rhythmic, positive systolic murmur at the base. No lower extremity edema. Pulmonary: positive breath sounds bilaterally,  Gastrointestinal. Abdomen soft and non tender Skin. No rashes Musculoskeletal: no joint deformities     Data Reviewed: I have personally reviewed following labs and imaging studies  CBC: Recent Labs  Lab 05/23/21 0217 05/24/21 0116 05/25/21 0513 05/26/21 0344  WBC 8.1 8.6 4.9 5.2  HGB 8.3* 8.1* 7.0* 8.1*  HCT 25.5* 25.3* 21.0* 24.4*  MCV 98.1 95.1 96.8 94.2  PLT 125* 138* 119* 409*   Basic Metabolic Panel: Recent Labs  Lab 05/23/21 0217 05/24/21 0116 05/25/21 0513  NA 136 136 133*  K 4.4 3.5 4.1  CL 95* 96* 96*  CO2 29 30 26   GLUCOSE 128* 109* 93  BUN 41* 20 53*  CREATININE 4.90* 2.95* 4.94*  CALCIUM 8.8* 8.9 8.2*  MG  --   --  2.2  PHOS  --  1.8*  --    GFR: Estimated Creatinine Clearance: 13.1 mL/min (A) (by C-G formula based on SCr of 4.94 mg/dL (H)). Liver Function Tests: Recent Labs  Lab 05/23/21 0217 05/24/21 0116  AST 18  --   ALT 12  --   ALKPHOS 93  --   BILITOT 0.8  --   PROT 7.1  --   ALBUMIN 3.3* 3.2*   Recent Labs  Lab 05/23/21 0217  LIPASE 44   No results for input(s): AMMONIA in the last 168 hours. Coagulation Profile: No results for input(s): INR, PROTIME in the last 168 hours. Cardiac Enzymes: No results for input(s): CKTOTAL, CKMB, CKMBINDEX, TROPONINI in the last 168 hours. BNP (last 3 results) No results for input(s): PROBNP in the last 8760 hours. HbA1C: No results for input(s): HGBA1C in the last 72 hours. CBG: No results for input(s): GLUCAP in the last 168  hours. Lipid Profile: No results for input(s): CHOL, HDL, LDLCALC, TRIG, CHOLHDL, LDLDIRECT in the last 72 hours. Thyroid Function Tests: No results for input(s): TSH, T4TOTAL, FREET4, T3FREE, THYROIDAB in the last 72 hours. Anemia Panel: No results for input(s): VITAMINB12, FOLATE, FERRITIN, TIBC, IRON, RETICCTPCT in the last 72 hours.    Radiology Studies: I have reviewed all of the imaging during this hospital visit personally     Scheduled Meds:  sodium chloride   Intravenous Once   aspirin EC  81 mg Oral Daily   atorvastatin  80 mg Oral Daily   carvedilol  6.25 mg Oral BID   Chlorhexidine Gluconate Cloth  6 each Topical Daily   clopidogrel  75 mg Oral Daily   levothyroxine  50 mcg Oral QAC breakfast   pantoprazole  40 mg Oral BID   sodium chloride flush  3 mL Intravenous Q12H   sodium  chloride flush  3 mL Intravenous Q12H   Continuous Infusions:  sodium chloride     sodium chloride     sodium chloride     sodium chloride     sodium chloride 10 mL/hr at 05/26/21 0419   heparin 1,750 Units/hr (05/26/21 0247)   nitroGLYCERIN Stopped (05/23/21 1950)     LOS: 3 days        Tymeshia Awan Gerome Apley, MD

## 2021-05-26 NOTE — Progress Notes (Signed)
°  [facility] Progress Note   Assessment/ Plan:     ESRD [facility] MWF  1. NSTEMI: on hep gtt, cath for 05/27/21 2.ESRD: MWF, short HD today to get back on schedule, had HD at [facility] yesterday 05/25/21.  New EDW needs to be established 3. Anemia: Hgb improved 7.0--> 8.1, not sure 7.0 accurate 4. CKD-MBD: binders  5. Nutrition: renal diet    Subjective:    Transferred from [facility] to [facility] for cath.  CP free at present.  Cath 05/27/21   Objective:   BP (!) 135/52    Pulse 67    Temp (!) 97.5 F (36.4 C) (Oral)    Resp 20    Ht 5\' 9"  (1.753 m)    Wt 79.8 kg    SpO2 96%    BMI 25.98 kg/m   Physical Exam: Gen:NAD, sitting in chair CVS:RRR Resp: clear basilar Abd: soft Ext: no LE edema ACCESS: RUE AVF + T/B with some prox whistling  Labs: BMET Recent Labs  Lab 05/23/21 0217 05/24/21 0116 05/25/21 0513  NA 136 136 133*  K 4.4 3.5 4.1  CL 95* 96* 96*  CO2 29 30 26   GLUCOSE 128* 109* 93  BUN 41* 20 53*  CREATININE 4.90* 2.95* 4.94*  CALCIUM 8.8* 8.9 8.2*  PHOS  --  1.8*  --    CBC Recent Labs  Lab 05/23/21 0217 05/24/21 0116 05/25/21 0513 05/26/21 0344  WBC 8.1 8.6 4.9 5.2  HGB 8.3* 8.1* 7.0* 8.1*  HCT 25.5* 25.3* 21.0* 24.4*  MCV 98.1 95.1 96.8 94.2  PLT 125* 138* 119* 127*      Medications:     sodium chloride   Intravenous Once   aspirin EC  81 mg Oral Daily   atorvastatin  80 mg Oral Daily   carvedilol  6.25 mg Oral BID   Chlorhexidine Gluconate Cloth  6 each Topical Daily   clopidogrel  75 mg Oral Daily   levothyroxine  50 mcg Oral QAC breakfast   pantoprazole  40 mg Oral BID   sodium chloride flush  3 mL Intravenous Q12H   sodium chloride flush  3 mL Intravenous Q12H     Madelon Lips, MD 05/26/2021, 2:11 PM

## 2021-05-26 NOTE — Progress Notes (Signed)
°   05/26/21 1445  Clinical Encounter Type  Visited With Patient  Visit Type Initial  Referral From Nurse  Consult/Referral To Chaplain   Initial visit; Chaplain Baker Janus Staphany Ditton's response to PMT consult request for an Advance Directive. The patient was sitting in a chair during the visit. Ike Bene provided A.D. education. Ike Bene observed the patient's facial expression appeared confused. The patient said he told someone before he did not need the A.D. because his wife, Sula Soda, would make decisions for him. Ike Bene transitioned and asked opened ended questions about his family support system. Chaplain Baker Janus acknowledged that his facial expression appeared relaxed when he spoke of his wife. Chaplain offered prayer.This note was prepared by Jeanine Luz, M.Div..  For questions please contact by phone [phone].

## 2021-05-26 NOTE — H&P (Signed)
Complex CAD with CABG (2008 &2018) and graft failure (LIMA LAD; LRA RCA; SVG OM/D1; SVG D2; SVG rPl) Aortic stenosis, ? TAVR Anemia requiring transfusions ESRD on dialysis

## 2021-05-27 DIAGNOSIS — I214 Non-ST elevation (NSTEMI) myocardial infarction: Principal | ICD-10-CM

## 2021-05-27 LAB — CBC
HCT: 22.9 % — ABNORMAL LOW (ref 39.0–52.0)
Hemoglobin: 7.7 g/dL — ABNORMAL LOW (ref 13.0–17.0)
MCH: 31.7 pg (ref 26.0–34.0)
MCHC: 33.6 g/dL (ref 30.0–36.0)
MCV: 94.2 fL (ref 80.0–100.0)
Platelets: 129 10*3/uL — ABNORMAL LOW (ref 150–400)
RBC: 2.43 MIL/uL — ABNORMAL LOW (ref 4.22–5.81)
RDW: 14.8 % (ref 11.5–15.5)
WBC: 4.3 10*3/uL (ref 4.0–10.5)
nRBC: 0 % (ref 0.0–0.2)

## 2021-05-27 LAB — BASIC METABOLIC PANEL
Anion gap: 13 (ref 5–15)
BUN: 18 mg/dL (ref 8–23)
CO2: 28 mmol/L (ref 22–32)
Calcium: 8.8 mg/dL — ABNORMAL LOW (ref 8.9–10.3)
Chloride: 94 mmol/L — ABNORMAL LOW (ref 98–111)
Creatinine, Ser: 2.98 mg/dL — ABNORMAL HIGH (ref 0.61–1.24)
GFR, Estimated: 21 mL/min — ABNORMAL LOW (ref >60)
Glucose, Bld: 90 mg/dL (ref 70–99)
Potassium: 3.7 mmol/L (ref 3.5–5.1)
Sodium: 135 mmol/L (ref 135–145)

## 2021-05-27 LAB — HEPATITIS B E ANTIBODY: Hep B E Ab: NEGATIVE

## 2021-05-27 LAB — HEPARIN LEVEL (UNFRACTIONATED): Heparin Unfractionated: 0.65 IU/mL (ref 0.30–0.70)

## 2021-05-27 LAB — PROTIME-INR
INR: 1.1 (ref 0.8–1.2)
Prothrombin Time: 14.4 seconds (ref 11.4–15.2)

## 2021-05-27 LAB — HEPATITIS B SURFACE ANTIBODY, QUANTITATIVE: Hep B S AB Quant (Post): <3.1 m[IU]/mL — ABNORMAL LOW (ref >9.9)

## 2021-05-27 MED ORDER — DARBEPOETIN ALFA 60 MCG/0.3ML IJ SOSY
60.0000 ug | PREFILLED_SYRINGE | Freq: Once | INTRAMUSCULAR | Status: AC
  Administered 2021-05-27: 60 ug via SUBCUTANEOUS
  Filled 2021-05-27 (×2): qty 0.3

## 2021-05-27 MED ORDER — MELATONIN 5 MG PO TABS
10.0000 mg | ORAL_TABLET | Freq: Every evening | ORAL | Status: DC | PRN
  Filled 2021-05-27 (×2): qty 2

## 2021-05-27 NOTE — Progress Notes (Addendum)
ANTICOAGULATION CONSULT NOTE - follow up  Pharmacy Consult for heparin Indication: chest pain/ACS  Allergies  Allergen Reactions   Cardizem Cd [Diltiazem Hcl Er Beads] Palpitations    "Makes heart skip"    Patient Measurements: Height: 5\' 9"  (175.3 cm) Weight: 80.4 kg (177 lb 3.2 oz) IBW/kg (Calculated) : 70.7  Heparin dosing weight : 80.4 kg  Vital Signs: Temp: 97.6 F (36.4 C) (01/12 0851) Temp Source: Oral (01/12 0851) BP: 114/56 (01/12 0851) Pulse Rate: 75 (01/12 0851)  Labs: Recent Labs    05/25/21 0513 05/25/21 1102 05/26/21 0344 05/27/21 0402 05/27/21 0419  HGB 7.0*  --  8.1* 7.7*  --   HCT 21.0*  --  24.4* 22.9*  --   PLT 119*  --  127* 129*  --   LABPROT  --   --   --  14.4  --   INR  --   --   --  1.1  --   HEPARINUNFRC 0.32 0.37 0.44 0.65  --   CREATININE 4.94*  --   --   --  2.98*     Estimated Creatinine Clearance: 21.7 mL/min (A) (by C-G formula based on SCr of 2.98 mg/dL (H)).   Medical History: Past Medical History:  Diagnosis Date   Acute myocardial infarction of other inferior wall, initial episode of care    Anemia    Anginal pain ([place])    Anxiety    Arthritis    Cancer ([place])    bladder 2013 removed, has cystoscopy 10/2016, has returned x 2   COPD (chronic obstructive pulmonary disease) ([place])    Coronary artery disease    Artery bypass graft Jan 2008   Dialysis patient [facility])    Dyspnea    Dysrhythmia    GERD (gastroesophageal reflux disease)    Heart murmur    History of hiatal hernia    Hyperlipidemia    Hypertension    Hypothyroidism    PONV (postoperative nausea and vomiting)    Renal disorder    Sleep apnea    Stroke [facility])    Tobacco user     Assessment: 76yo male c/o "heartburn" associated with diaphoresis but on arrival to ED on 05/23/21 at [facility], the  ECG reveals global ischemia >> troponin elevated >> thus pharmacy consulted to start IV heparin on 05/23/21.   1/9  Echo:  EF 45%, severe aortic stenosis. Transfer to [facility]  for heart cath and further evaluation of his aortic stenosis.  Heparin level is 0.65,  remains therapeutic on heparin infusion at 1750 ut/hr. CBC:  hgb down from 8.1 to 7.7 today.  Pltc low stable at 129k.  No bleeding reported.  Possible cardiac cath today.   Heparin level therapeutic, but trending upward and Hgb has dropped, low pltc.   Will reduce heparin rate to keep HL within goal.     Goal of Therapy:  Heparin level 0.3-0.7 units/ml Monitor platelets by anticoagulation protocol: Yes   Plan:  Decrease heparin infusion to 1650 units/hr. Monitor heparin levels daily and CBC.  Follow up post Cath.    Thank you for allowing pharmacy to be part of this patients care team.  Nicole Cella, [place] Pharmacist [phone] 05/27/2021 9:00 AM Please check AMION for all [place] phone numbers After 10:00 PM, call [place] [phone]

## 2021-05-27 NOTE — Plan of Care (Signed)
°  Problem: Education: Goal: Knowledge of General Education information will improve Description: Including pain rating scale, medication(s)/side effects and non-pharmacologic comfort measures Outcome: Progressing   Problem: Health Behavior/Discharge Planning: Goal: Ability to manage health-related needs will improve Outcome: Progressing   Problem: Clinical Measurements: Goal: Respiratory complications will improve Outcome: Progressing   Problem: Clinical Measurements: Goal: Cardiovascular complication will be avoided Outcome: Progressing

## 2021-05-27 NOTE — Progress Notes (Signed)
PROGRESS NOTE    [name]  UDJ:[id number] DOB: 1945-09-07 DOA: 05/23/2021 PCP: [facility]    Brief Narrative:  [name] was admitted to the hospital with the working diagnosis of NSTEMI.  Transferred from [facility] to [facility].    76 yo male with the past medical history of ESRD on HD, bladder cancer 2013, BPH, HTN, dyslipidemia, hypothyroid, cholelithiasis, CAD sp CABG, and GERD who presented with chest pain. Reported burning type chest pain, mid chest, epigastric region, 8/10 in intensity, constant and associated with diaphoresis and dyspnea that prompted him to come to the hospital. Pain worse with exertion. On his initial physical examination his blood pressure was 135/55, HR 84, RR 26 to 28, oxygen saturation 95% on supplemental 02, lungs clear to auscultation, heart with S1 and S2 present and rhythmic, abdomen soft and no lower extremity edema.    Sodium 136, potassium 4.4, chloride 95, bicarb 29, glucose 128, BUN 41, creatinine 4.90, lipase 44. High sensitive troponin 505, white count 8.1, hemoglobin 8.3, hematocrit 25.5, platelets 125. SARS COVID-19 negative.   Chest radiograph with hilar vascular congestion (personally reviewed).    EKG 101 bpm, normal axis and normal intervals, sinus rhythm with PAC, J point elevation V1-V3, ST depression I-AVL, V5 and V6, lead II and III.   Patient was placed on heparin drip, and nitroglycerin drip.    Positive volume overload with acute hypoxic respiratory failure, he was placed on on invasive mechanical ventilation and underwent urgent HD 05/23/21.   Echocardiogram with inferior lateral hypokinesis, EF LV 45%, moderate cavity dilatation. RV systolic functio normal. Severe aortic stenosis.    01/10 patient was transferred to [facility] for further cardiac workup.     Assessment & Plan:   Principal Problem:   NSTEMI (non-ST elevated myocardial infarction) ([place]) Active Problems:   HYPERLIPIDEMIA, MIXED   Essential hypertension   S/P  CABG x 2   History of bladder cancer   End-stage renal disease on hemodialysis (HCC)   Hypothyroidism (acquired)   Aortic stenosis     NSTEMI, sp CABGx2, severe aortic stenosis, dyslipidemia At home had progressive chest pain on exertion for 3 months and on admission presented symptoms at rest.    Continue with heparin drip in preparation for cardiac catheterization.  Patient may need TAVR for symptomatic aortic stenosis.  procedure has been delayed due to anemia.   Continue with aspirin/ clopidogrel and atorvastatin    2. Acute pulmonary edema, with acute hypoxemic respiratory failure, volume overload ESRD on HD.  Patient will have HD in am, per his usual regimen.    Anemia of chronic renal disease, target hgb is 11, patient has been resumed on EPO.  Other option is giving patient PRBC transfusion in HD to expedite cardiac work up.    3. HTN.  Continue blood pressure control with metoprolol.    4. Hypothyroid.  On levothyroxine      Patient continue to be at high risk for worsening heart failure or angina.   Status is: Inpatient  Remains inpatient appropriate because: IV heparin pending cardiac cathterization        DVT prophylaxis:  Heparin   Code Status:    full  Family Communication:   No family at the bedside      Consultants:  Cardiology  Nephrology   P   Subjective: Patient with no chest pain at rest, continue to have dyspnea on exertion, more than his baseline, no nausea or vomiting, no chest pain while ambulating in  the wallway.   Objective: Vitals:   05/27/21 1226 05/27/21 1227 05/27/21 1228 05/27/21 1300  BP: (!) 119/46 (!) 124/42    Pulse: 61 60 (!) 56   Resp:    19  Temp:      TempSrc:      SpO2: 94% 96% 95%   Weight:      Height:        Intake/Output Summary (Last 24 hours) at 05/27/2021 1349 Last data filed at 05/27/2021 1300 Gross per 24 hour  Intake 1761.67 ml  Output 1620 ml  Net 141.67 ml   Filed Weights   05/26/21 1930  05/26/21 2315 05/27/21 0007  Weight: 83.3 kg 80.8 kg 80.4 kg    Examination:   General: Not in pain or dyspnea, deconditioned  Neurology: Awake and alert, non focal  E ENT: no pallor, no icterus, oral mucosa moist Cardiovascular: No JVD. S1-S2 present, rhythmic, no gallops, rubs, positive 3.6 systolic murmur right sternal border and radiated to the carotids. No lower extremity edema. Pulmonary: positive breath sounds bilaterally, with wheezing, rhonchi or rales. Gastrointestinal. Abdomen soft and non tender Skin. No rashes Musculoskeletal: no joint deformities     Data Reviewed: I have personally reviewed following labs and imaging studies  CBC: Recent Labs  Lab 05/23/21 0217 05/24/21 0116 05/25/21 0513 05/26/21 0344 05/27/21 0402  WBC 8.1 8.6 4.9 5.2 4.3  HGB 8.3* 8.1* 7.0* 8.1* 7.7*  HCT 25.5* 25.3* 21.0* 24.4* 22.9*  MCV 98.1 95.1 96.8 94.2 94.2  PLT 125* 138* 119* 127* 195*   Basic Metabolic Panel: Recent Labs  Lab 05/23/21 0155 05/23/21 0217 05/24/21 0116 05/25/21 0513 05/27/21 0419  NA 132* 136 136 133* 135  K 4.7 4.4 3.5 4.1 3.7  CL 96* 95* 96* 96* 94*  CO2 27 29 30 26 28   GLUCOSE 105* 128* 109* 93 90  BUN 41* 41* 20 53* 18  CREATININE 4.90* 4.90* 2.95* 4.94* 2.98*  CALCIUM 8.7* 8.8* 8.9 8.2* 8.8*  MG  --   --   --  2.2  --   PHOS 1.5*  --  1.8*  --   --    GFR: Estimated Creatinine Clearance: 21.7 mL/min (A) (by C-G formula based on SCr of 2.98 mg/dL (H)). Liver Function Tests: Recent Labs  Lab 05/23/21 0155 05/23/21 0217 05/24/21 0116  AST  --  18  --   ALT  --  12  --   ALKPHOS  --  93  --   BILITOT  --  0.8  --   PROT  --  7.1  --   ALBUMIN 2.8* 3.3* 3.2*   Recent Labs  Lab 05/23/21 0217  LIPASE 44   No results for input(s): AMMONIA in the last 168 hours. Coagulation Profile: Recent Labs  Lab 05/27/21 0402  INR 1.1   Cardiac Enzymes: No results for input(s): CKTOTAL, CKMB, CKMBINDEX, TROPONINI in the last 168 hours. BNP  (last 3 results) No results for input(s): PROBNP in the last 8760 hours. HbA1C: No results for input(s): HGBA1C in the last 72 hours. CBG: No results for input(s): GLUCAP in the last 168 hours. Lipid Profile: No results for input(s): CHOL, HDL, LDLCALC, TRIG, CHOLHDL, LDLDIRECT in the last 72 hours. Thyroid Function Tests: No results for input(s): TSH, T4TOTAL, FREET4, T3FREE, THYROIDAB in the last 72 hours. Anemia Panel: No results for input(s): VITAMINB12, FOLATE, FERRITIN, TIBC, IRON, RETICCTPCT in the last 72 hours.    Radiology Studies: I have reviewed all of the  imaging during this hospital visit personally     Scheduled Meds:  sodium chloride   Intravenous Once   aspirin EC  81 mg Oral Daily   atorvastatin  80 mg Oral Daily   carvedilol  6.25 mg Oral BID   Chlorhexidine Gluconate Cloth  6 each Topical Daily   clopidogrel  75 mg Oral Daily   levothyroxine  50 mcg Oral QAC breakfast   pantoprazole  40 mg Oral BID   sodium chloride flush  3 mL Intravenous Q12H   sodium chloride flush  3 mL Intravenous Q12H   Continuous Infusions:  sodium chloride     sodium chloride     sodium chloride     sodium chloride     sodium chloride 10 mL/hr at 05/27/21 0623   heparin 1,650 Units/hr (05/27/21 0923)     LOS: 4 days        Lezli Danek Gerome Apley, MD

## 2021-05-27 NOTE — Progress Notes (Signed)
Pt placed on CPAP and resting comfortably. RT will cont to monitor .  

## 2021-05-27 NOTE — Plan of Care (Signed)
  Problem: Education: Goal: Knowledge of General Education information will improve Description Including pain rating scale, medication(s)/side effects and non-pharmacologic comfort measures Outcome: Progressing   Problem: Health Behavior/Discharge Planning: Goal: Ability to manage health-related needs will improve Outcome: Progressing   

## 2021-05-27 NOTE — Progress Notes (Addendum)
Progress Note  Patient Name: [name] Date of Encounter: 05/27/2021  [facility] Cardiologist: Carlyle Dolly, MD   Subjective   No acute overnight events. Patient denies any recurrent chest pain. He notes significant improvement in his breathing. Initially plan was for [facility] but this has been pushed to tomorrow given anemia.  Of note, patient states hemoglobin is going to continue to drop because he is no longer on Epoetin injections. Patient states hemoglobin improved to the 11 range after being started on Epoetin but this was stopped during a hospitalization in 03/2021 due to a renal mass that was noticed. However, patient states he has had that mass for several years.  Inpatient Medications    Scheduled Meds:  sodium chloride   Intravenous Once   aspirin EC  81 mg Oral Daily   atorvastatin  80 mg Oral Daily   carvedilol  6.25 mg Oral BID   Chlorhexidine Gluconate Cloth  6 each Topical Daily   clopidogrel  75 mg Oral Daily   levothyroxine  50 mcg Oral QAC breakfast   pantoprazole  40 mg Oral BID   sodium chloride flush  3 mL Intravenous Q12H   sodium chloride flush  3 mL Intravenous Q12H   Continuous Infusions:  sodium chloride     sodium chloride     sodium chloride     sodium chloride     sodium chloride 10 mL/hr at 05/27/21 0623   heparin 1,650 Units/hr (05/27/21 0923)   PRN Meds: sodium chloride, sodium chloride, sodium chloride, sodium chloride, acetaminophen **OR** acetaminophen, HYDROmorphone (DILAUDID) injection, lidocaine (PF), lidocaine-prilocaine, nitroGLYCERIN, ondansetron **OR** ondansetron (ZOFRAN) IV, pentafluoroprop-tetrafluoroeth, sodium chloride flush, sodium chloride flush   Vital Signs    Vitals:   05/27/21 0003 05/27/21 0007 05/27/21 0330 05/27/21 0851  BP: (!) 153/66  (!) 117/39 (!) 114/56  Pulse: 62  64 75  Resp: 18  18 20   Temp: 97.7 F (36.5 C)  98 F (36.7 C) 97.6 F (36.4 C)  TempSrc: Oral  Oral Oral  SpO2: 97%  92% 97%   Weight:  80.4 kg    Height:        Intake/Output Summary (Last 24 hours) at 05/27/2021 1029 Last data filed at 05/27/2021 0923 Gross per 24 hour  Intake 1988.67 ml  Output 1520 ml  Net 468.67 ml   Last 3 Weights 05/27/2021 05/26/2021 05/26/2021  Weight (lbs) 177 lb 3.2 oz 178 lb 2.1 oz 183 lb 10.3 oz  Weight (kg) 80.377 kg 80.8 kg 83.3 kg      Telemetry    Normal sinus rhythm with rates in the 60s.- Personally Reviewed  ECG    No new ECG tracing since 05/23/2021. - Personally Reviewed  Physical Exam   GEN: No acute distress.   Neck: No JVD. Cardiac: RRR, II/VI systolic murmur. No rubs or gallops. Respiratory: No increased work of breathing. Mild crackles in bases (may be more atelectasis) but otherwise clear to auscultation. GI: Soft, non-distended, and non-tender. MS: No lower extremity edema. No deformity. Skin: Warm and dry. Neuro:  No focal deficits. Psych: Normal affect. Responds appropriately.  Labs    High Sensitivity Troponin:   Recent Labs  Lab 05/23/21 0417 05/23/21 0723 05/23/21 2302 05/24/21 0116 05/24/21 0452  TROPONINIHS 1,361* 3,951* 11,751* 14,741* 12,806*     Chemistry Recent Labs  Lab 05/23/21 0155 05/23/21 0217 05/24/21 0116 05/25/21 0513 05/27/21 0419  NA 132* 136 136 133* 135  K 4.7 4.4 3.5 4.1 3.7  CL 96* 95* 96* 96* 94*  CO2 27 29 30 26 28   GLUCOSE 105* 128* 109* 93 90  BUN 41* 41* 20 53* 18  CREATININE 4.90* 4.90* 2.95* 4.94* 2.98*  CALCIUM 8.7* 8.8* 8.9 8.2* 8.8*  MG  --   --   --  2.2  --   PROT  --  7.1  --   --   --   ALBUMIN 2.8* 3.3* 3.2*  --   --   AST  --  18  --   --   --   ALT  --  12  --   --   --   ALKPHOS  --  93  --   --   --   BILITOT  --  0.8  --   --   --   GFRNONAA 12* 12* 22* 12* 21*  ANIONGAP 9 12 10 11 13     Lipids No results for input(s): CHOL, TRIG, HDL, LABVLDL, LDLCALC, CHOLHDL in the last 168 hours.  Hematology Recent Labs  Lab 05/25/21 0513 05/26/21 0344 05/27/21 0402  WBC 4.9 5.2 4.3  RBC  2.17* 2.59* 2.43*  HGB 7.0* 8.1* 7.7*  HCT 21.0* 24.4* 22.9*  MCV 96.8 94.2 94.2  MCH 32.3 31.3 31.7  MCHC 33.3 33.2 33.6  RDW 15.1 15.2 14.8  PLT 119* 127* 129*   Thyroid No results for input(s): TSH, FREET4 in the last 168 hours.  BNP Recent Labs  Lab 05/23/21 0417  BNP 3,900.0*    DDimer No results for input(s): DDIMER in the last 168 hours.   Radiology    No results found.  Cardiac Studies   Echocardiogram 05/24/2021: Impressions: 1. Inferolateral hypokinesis.. Left ventricular ejection fraction, by  estimation, is 45%%. The left ventricular internal cavity size was  moderately dilated. There is mild left ventricular hypertrophy. Left  ventricular diastolic parameters are consistent   with Grade II diastolic dysfunction (pseudonormalization). Elevated left  atrial pressure.   2. Right ventricular systolic function is normal. The right ventricular  size is normal. There is moderately elevated pulmonary artery systolic  pressure.   3. Left atrial size was moderately dilated.   4. Right atrial size was mildly dilated.   5. Mild to moderate mitral valve regurgitation.   6. AV is thickened, calcified with restricted motion. Peak and mean  gradients through the valve are 41 and 24 mm Hg respectively. AVA (VTI) is  0.92 cm2 Dimensionless index is 0.24 consistent with severe AS. Note SVI  is 38, lower end of normal.. Aortic  valve regurgitation is mild to moderate.   7. The inferior vena cava is normal in size with greater than 50%  respiratory variability, suggesting right atrial pressure of 3 mmHg.   Patient Profile     76 y.o. male with a history of CAD s/p CABG x4 (LIMA-LAD, SVG-D2, SVG-OM, SVG-RPL) in 2008 with subsequent DES to SVG to RCA graft in 2015 and re-do CABG x2 (RIMA-Diag and left radial-distal RCA) in 09/2016, chronic CHF with mildly reduced EF of 45-50%, severe aortic stenosis, hypertension, hyperlipidemia, ESRD on hemodialysis, anemia, and history of  bladder cancer who was admitted on 05/23/2021 for NSTEMI and hypoxic respiratory failure (initially requiring BiPAP) after presenting with chest pain, dyspnea, and diaphoresis.  Assessment & Plan    CAD s/p CABG Patient has a long history of CAD as described above.  He underwent CABG x4 in 2008 and then redo CABG x2 in 2018.  Last cath in 2019  at outside facility showed occluded RIMA graft, CTO of SVG-RPL, and acute on chronic occlusion of SVG-D2.  There were reportedly no good options for PCI at that time and medical management was recommended.  Patient now admitted with NSTEMI with high-sensitivity troponin peaking at 14,741.  Echo this admission showed LVEF of 45% with inferior lateral hypokinesis, grade 2 diastolic dysfunction, normal RV, severe AS/ mild to moderate AI, mild to moderate MR, and moderately elevated PASP. - NO recurrent chest pain. -Continue IV heparin. -Continue DAPT with Aspirin and Plavix. -Continue beta-blocker and high intensity statin. -Initial plan was for cardiac catheterization today; however, patient has acute on chronic anemia with hemoglobin dropping down to 7.7 today.  He received 1 unit of PRBC on 05/25/2021. Would like hemoglobin to be above 8.0 before proceeding with cardiac catheterization so will hold off on cath today.on   Chronic CHF with Mildly Reduced EF BNP markedly elevated at 3900 on admission.  Chest x-ray showed bilateral perihilar lower lobe opacities concerning for edema.  Showed LVEF of 45% with inferior lateral hypokinesis and grade 2 diastolic dysfunction as stated above.  EF down from 55 to 60% on echo in 03/2021. -Volume status managed with hemodialysis. Appears euvolemic on exam today. -Continue Coreg 6.25 mg twice daily. -No ACE/ARB/ARNI due to ESRD.  Severe Aortic Stenosis Echo this admission showed severe AS with mean gradient of 24 mmHg, AVA (VTI) 0.92 cm, and dimensionless index of 0.24.  Also showed mild to moderate AI and mild to moderate  MR. -Plan is for right/left cardiac catheterization when hemoglobin will allow.  Hypertension BP soft at times but mostly well controlled. -Continue Coreg as above.  Hyperlipidemia LDL 88 in 01/2020. LDL goal <55. - Continue Lipitor 80mg  daily. - Will repeat fasting lipid panel in the morning. May need addition of Zetia.  ESRD On hemodialysis M/W/F. - Management per Nephrology.  Acute on Chronic Anemia Hemoglobin 8.3 on admission and dropped as low as 7.0 on 05/25/2021.  S/p 1 unit of PRBCs.  Hemoglobin 7.7 today, down from 8.1 yesterday. -Of note, patient atient states hemoglobin is going to continue to drop because he is no longer on Epoetin injections. Patient states hemoglobin improved to the 11 range after being started on Epoetin but this was stopped during a hospitalization in 03/2021 due to a renal mass that was noticed. However, patient states he has had that mass for several years and it has remained stable. May need to consider restarting Epoetin. Will defer to primary team and Nephrology.   Patient seen and examined, note reviewed with the signed Advanced Practice Provider. I personally reviewed laboratory data, imaging studies and relevant notes. I independently examined the patient and formulated the important aspects of the plan. I have personally discussed the plan with the patient and/or family. Comments or changes to the note/plan are indicated below.  Discussed with the patient and his family members at his bedside. Plan for right and left heart catheterization when hemoglobin allows.   Berniece Salines DO, MS [facility] Attending Cardiologist [place]  [address] [place], [place] [address] [phone] Website: [email or web address]    For questions or updates, please contact [place] Please consult [email or web address] for contact info under        Signed, Darreld Mclean, PA-C  05/27/2021, 10:29 AM

## 2021-05-27 NOTE — Progress Notes (Signed)
Pt receives out-pt HD at [facility] on MWF. Pt arrives at 6:10 for 6:30 chair time. Will assist as needed.   Melven Sartorius Renal Navigator [phone]

## 2021-05-27 NOTE — Progress Notes (Signed)
Patient states that when he is at home, he takes 80mg  lasix daily on the days he does not have dialysis. He is concerned because he says he got a little SHOB while walking in the room and his ankles are a little swollen. He is worried that he's not getting his lasix here.

## 2021-05-27 NOTE — Progress Notes (Signed)
Patient has cpap at home and he hasn't been using it here, he requested a cpap and something to help him sleep. Provider notified.

## 2021-05-27 NOTE — Progress Notes (Signed)
°  [facility] Progress Note   Assessment/ Plan:     ESRD [facility] MWF  1. NSTEMI: on hep gtt, cath for 05/27/21 but deferred d/t Hgb 2.ESRD: MWF, short HD 05/26/21 had HD at [facility] 05/25/21.  New EDW needs to be established.  Next planned HD 05/28/21 3. Anemia: Hgb improved 7.0--> 8.1 s/p 1 u pRBCs, now 7.7 this AM.  D/w pt- will give one dose of Aranesp today in effort to get Hgb to goal. 4. CKD-MBD: binders  5. Nutrition: renal diet    Subjective:    Cath deferred d/t Hgb < 8.0.  Wasn't getting EPO at HD d/t concerns over renal mass- pt says has not grown in 2 years.  HD yesterday without indicent   Objective:   BP (!) 124/46 (BP Location: Left Arm)    Pulse 75    Temp 98 F (36.7 C) (Oral)    Resp (!) 28    Ht 5\' 9"  (1.753 m)    Wt 80.4 kg    SpO2 95%    BMI 26.17 kg/m   Physical Exam: Gen:NAD, sitting in chair CVS:RRR Resp: clear basilar Abd: soft Ext: no LE edema ACCESS: RUE AVF + T/B with some prox whistling  Labs: BMET Recent Labs  Lab 05/23/21 0155 05/23/21 0217 05/24/21 0116 05/25/21 0513 05/27/21 0419  NA 132* 136 136 133* 135  K 4.7 4.4 3.5 4.1 3.7  CL 96* 95* 96* 96* 94*  CO2 27 29 30 26 28   GLUCOSE 105* 128* 109* 93 90  BUN 41* 41* 20 53* 18  CREATININE 4.90* 4.90* 2.95* 4.94* 2.98*  CALCIUM 8.7* 8.8* 8.9 8.2* 8.8*  PHOS 1.5*  --  1.8*  --   --    CBC Recent Labs  Lab 05/24/21 0116 05/25/21 0513 05/26/21 0344 05/27/21 0402  WBC 8.6 4.9 5.2 4.3  HGB 8.1* 7.0* 8.1* 7.7*  HCT 25.3* 21.0* 24.4* 22.9*  MCV 95.1 96.8 94.2 94.2  PLT 138* 119* 127* 129*      Medications:     sodium chloride   Intravenous Once   aspirin EC  81 mg Oral Daily   atorvastatin  80 mg Oral Daily   carvedilol  6.25 mg Oral BID   Chlorhexidine Gluconate Cloth  6 each Topical Daily   clopidogrel  75 mg Oral Daily   darbepoetin (ARANESP) injection - NON-DIALYSIS  60 mcg Subcutaneous Once   levothyroxine  50 mcg Oral QAC breakfast   pantoprazole  40 mg  Oral BID   sodium chloride flush  3 mL Intravenous Q12H   sodium chloride flush  3 mL Intravenous Q12H     Madelon Lips, MD 05/27/2021, 12:45 PM

## 2021-05-28 ENCOUNTER — Encounter (HOSPITAL_COMMUNITY)
Admission: EM | Disposition: A | Discharge status: Home / Self Care | Payer: Self-pay | Source: Home / Self Care | Attending: Internal Medicine

## 2021-05-28 ENCOUNTER — Inpatient Hospital Stay (HOSPITAL_COMMUNITY): Payer: No Typology Code available for payment source

## 2021-05-28 DIAGNOSIS — I2510 Atherosclerotic heart disease of native coronary artery without angina pectoris: Secondary | ICD-10-CM

## 2021-05-28 DIAGNOSIS — I350 Nonrheumatic aortic (valve) stenosis: Secondary | ICD-10-CM

## 2021-05-28 HISTORY — PX: RIGHT/LEFT HEART CATH AND CORONARY/GRAFT ANGIOGRAPHY: CATH118267

## 2021-05-28 HISTORY — PX: CARDIAC CATHETERIZATION: SHX172

## 2021-05-28 LAB — POCT I-STAT 7, (LYTES, BLD GAS, ICA,H+H)
Acid-Base Excess: 6 mmol/L — ABNORMAL HIGH (ref 0.0–2.0)
Acid-Base Excess: 4 mmol/L — ABNORMAL HIGH (ref 0.0–2.0)
Acid-Base Excess: 8 mmol/L — ABNORMAL HIGH (ref 0.0–2.0)
Bicarbonate: 29.8 mmol/L — ABNORMAL HIGH (ref 20.0–28.0)
Bicarbonate: 29 mmol/L — ABNORMAL HIGH (ref 20.0–28.0)
Bicarbonate: 31.4 mmol/L — ABNORMAL HIGH (ref 20.0–28.0)
Calcium, Ion: 1.09 mmol/L — ABNORMAL LOW (ref 1.15–1.40)
Calcium, Ion: 1.07 mmol/L — ABNORMAL LOW (ref 1.15–1.40)
Calcium, Ion: 1.19 mmol/L (ref 1.15–1.40)
HCT: 25 % — ABNORMAL LOW (ref 39.0–52.0)
HCT: 24 % — ABNORMAL LOW (ref 39.0–52.0)
HCT: 25 % — ABNORMAL LOW (ref 39.0–52.0)
Hemoglobin: 8.5 g/dL — ABNORMAL LOW (ref 13.0–17.0)
Hemoglobin: 8.2 g/dL — ABNORMAL LOW (ref 13.0–17.0)
Hemoglobin: 8.5 g/dL — ABNORMAL LOW (ref 13.0–17.0)
O2 Saturation: 93 %
O2 Saturation: 73 %
O2 Saturation: 84 %
Potassium: 3.2 mmol/L — ABNORMAL LOW (ref 3.5–5.1)
Potassium: 3 mmol/L — ABNORMAL LOW (ref 3.5–5.1)
Potassium: 3.3 mmol/L — ABNORMAL LOW (ref 3.5–5.1)
Sodium: 138 mmol/L (ref 135–145)
Sodium: 137 mmol/L (ref 135–145)
Sodium: 140 mmol/L (ref 135–145)
TCO2: 31 mmol/L (ref 22–32)
TCO2: 30 mmol/L (ref 22–32)
TCO2: 33 mmol/L — ABNORMAL HIGH (ref 22–32)
pCO2 arterial: 41.4 mmHg (ref 32.0–48.0)
pCO2 arterial: 39.9 mmHg (ref 32.0–48.0)
pCO2 arterial: 43.6 mmHg (ref 32.0–48.0)
pH, Arterial: 7.466 — ABNORMAL HIGH (ref 7.350–7.450)
pH, Arterial: 7.431 (ref 7.350–7.450)
pH, Arterial: 7.504 — ABNORMAL HIGH (ref 7.350–7.450)
pO2, Arterial: 63 mmHg — ABNORMAL LOW (ref 83.0–108.0)
pO2, Arterial: 38 mmHg — CL (ref 83.0–108.0)
pO2, Arterial: 45 mmHg — ABNORMAL LOW (ref 83.0–108.0)

## 2021-05-28 LAB — CBC
HCT: 23.9 % — ABNORMAL LOW (ref 39.0–52.0)
Hemoglobin: 7.9 g/dL — ABNORMAL LOW (ref 13.0–17.0)
MCH: 31.3 pg (ref 26.0–34.0)
MCHC: 33.1 g/dL (ref 30.0–36.0)
MCV: 94.8 fL (ref 80.0–100.0)
Platelets: 132 10*3/uL — ABNORMAL LOW (ref 150–400)
RBC: 2.52 MIL/uL — ABNORMAL LOW (ref 4.22–5.81)
RDW: 14.8 % (ref 11.5–15.5)
WBC: 4.1 10*3/uL (ref 4.0–10.5)
nRBC: 0 % (ref 0.0–0.2)

## 2021-05-28 LAB — BASIC METABOLIC PANEL
Anion gap: 14 (ref 5–15)
BUN: 35 mg/dL — ABNORMAL HIGH (ref 8–23)
CO2: 25 mmol/L (ref 22–32)
Calcium: 9 mg/dL (ref 8.9–10.3)
Chloride: 94 mmol/L — ABNORMAL LOW (ref 98–111)
Creatinine, Ser: 4.73 mg/dL — ABNORMAL HIGH (ref 0.61–1.24)
GFR, Estimated: 12 mL/min — ABNORMAL LOW (ref >60)
Glucose, Bld: 94 mg/dL (ref 70–99)
Potassium: 4.4 mmol/L (ref 3.5–5.1)
Sodium: 133 mmol/L — ABNORMAL LOW (ref 135–145)

## 2021-05-28 LAB — TROPONIN I (HIGH SENSITIVITY): Troponin I (High Sensitivity): 1658 ng/L (ref <18)

## 2021-05-28 LAB — PREPARE RBC (CROSSMATCH)

## 2021-05-28 LAB — HEPARIN LEVEL (UNFRACTIONATED): Heparin Unfractionated: 0.59 IU/mL (ref 0.30–0.70)

## 2021-05-28 SURGERY — RIGHT/LEFT HEART CATH AND CORONARY/GRAFT ANGIOGRAPHY
Anesthesia: LOCAL

## 2021-05-28 MED ORDER — SODIUM CHLORIDE 0.9% FLUSH: 3.0000 mL | INTRAVENOUS | Status: DC | PRN

## 2021-05-28 MED ORDER — LIDOCAINE HCL (PF) 1 % IJ SOLN
INTRAMUSCULAR | Status: DC | PRN
  Administered 2021-05-28: 10 mL

## 2021-05-28 MED ORDER — IOHEXOL 350 MG/ML SOLN
INTRAVENOUS | Status: DC | PRN
  Administered 2021-05-28: 105 mL

## 2021-05-28 MED ORDER — FENTANYL CITRATE (PF) 100 MCG/2ML IJ SOLN
INTRAMUSCULAR | Status: DC | PRN
  Administered 2021-05-28 (×2): 12.5 ug via INTRAVENOUS

## 2021-05-28 MED ORDER — HYDRALAZINE HCL 20 MG/ML IJ SOLN: 10.0000 mg | INTRAMUSCULAR | Status: AC | PRN

## 2021-05-28 MED ORDER — LIDOCAINE HCL (PF) 1 % IJ SOLN
INTRAMUSCULAR | Status: AC
  Filled 2021-05-28: qty 30

## 2021-05-28 MED ORDER — SODIUM CHLORIDE 0.9 % IV SOLN: 250.0000 mL | INTRAVENOUS | Status: DC | PRN

## 2021-05-28 MED ORDER — FENTANYL CITRATE (PF) 100 MCG/2ML IJ SOLN
INTRAMUSCULAR | Status: AC
  Filled 2021-05-28: qty 2

## 2021-05-28 MED ORDER — SODIUM CHLORIDE 0.9% FLUSH
3.0000 mL | Freq: Two times a day (BID) | INTRAVENOUS | Status: DC
  Administered 2021-05-28 – 2021-05-29 (×2): 3 mL via INTRAVENOUS

## 2021-05-28 MED ORDER — HEPARIN (PORCINE) IN NACL 1000-0.9 UT/500ML-% IV SOLN
INTRAVENOUS | Status: AC
  Filled 2021-05-28: qty 1000

## 2021-05-28 MED ORDER — MIDAZOLAM HCL 2 MG/2ML IJ SOLN
INTRAMUSCULAR | Status: DC | PRN
  Administered 2021-05-28 (×2): .5 mg via INTRAVENOUS

## 2021-05-28 MED ORDER — SODIUM CHLORIDE 0.9% IV SOLUTION: Freq: Once | INTRAVENOUS | Status: DC

## 2021-05-28 MED ORDER — HEPARIN (PORCINE) IN NACL 1000-0.9 UT/500ML-% IV SOLN
INTRAVENOUS | Status: DC | PRN
  Administered 2021-05-28 (×3): 500 mL

## 2021-05-28 MED ORDER — SIMETHICONE 40 MG/0.6ML PO SUSP
40.0000 mg | Freq: Four times a day (QID) | ORAL | Status: DC | PRN
  Filled 2021-05-28 (×2): qty 0.6

## 2021-05-28 MED ORDER — MIDAZOLAM HCL 2 MG/2ML IJ SOLN
INTRAMUSCULAR | Status: AC
  Filled 2021-05-28: qty 2

## 2021-05-28 MED ORDER — LABETALOL HCL 5 MG/ML IV SOLN: 10.0000 mg | INTRAVENOUS | Status: AC | PRN

## 2021-05-28 SURGICAL SUPPLY — 14 items
CATH INFINITI 5 FR IM (CATHETERS) ×1 IMPLANT
CATH INFINITI 5FR MULTPACK ANG (CATHETERS) ×1 IMPLANT
CATH INFINITI JR4 5F (CATHETERS) ×1 IMPLANT
CATH SWAN GANZ 7F STRAIGHT (CATHETERS) ×1 IMPLANT
GUIDEWIRE .025 260CM (WIRE) ×1 IMPLANT
KIT HEART LEFT (KITS) ×2 IMPLANT
MAT PREVALON FULL STRYKER (MISCELLANEOUS) ×1 IMPLANT
PACK CARDIAC CATHETERIZATION (CUSTOM PROCEDURE TRAY) ×2 IMPLANT
SHEATH PINNACLE 5F 10CM (SHEATH) ×1 IMPLANT
SHEATH PINNACLE 7F 10CM (SHEATH) ×1 IMPLANT
SHEATH PROBE COVER 6X72 (BAG) ×1 IMPLANT
TRANSDUCER W/STOPCOCK (MISCELLANEOUS) ×2 IMPLANT
WIRE EMERALD 3MM-J .035X150CM (WIRE) ×1 IMPLANT
WIRE EMERALD 3MM-J .035X260CM (WIRE) ×1 IMPLANT

## 2021-05-28 NOTE — Progress Notes (Signed)
Site area: rt groin fa and fv sheaths Site Prior to Removal:  Level 0 Pressure Applied For: 20 minutes Manual:   yes Patient Status During Pull:  stable Post Pull Site:  Level 0 Post Pull Instructions Given:  yes Post Pull Pulses Present: rt dp palpable Dressing Applied:  gauze and tegaderm Bedrest begins @ 0148 Comments:

## 2021-05-28 NOTE — Progress Notes (Signed)
Patient had another episode of SHOB, RN paged MD. Discussed going to dialysis and getting 1 unit of blood. When dialysis was called. I was told that he was not on the schedule for today. Nephrologist paged. Message relayed to day shift RN.

## 2021-05-28 NOTE — Progress Notes (Signed)
PROGRESS NOTE    [name]  GNF:[id number] DOB: 24-Dec-1945 DOA: 05/23/2021 PCP: [facility]    Brief Narrative:  [name] was admitted to the hospital with the working diagnosis of NSTEMI.  Transferred from [facility] to [facility].    76 yo male with the past medical history of ESRD on HD, bladder cancer 2013, BPH, HTN, dyslipidemia, hypothyroid, cholelithiasis, CAD sp CABG, and GERD who presented with chest pain. Reported burning type chest pain, mid chest, epigastric region, 8/10 in intensity, constant and associated with diaphoresis and dyspnea that prompted him to come to the hospital. Pain worse with exertion. On his initial physical examination his blood pressure was 135/55, HR 84, RR 26 to 28, oxygen saturation 95% on supplemental 02, lungs clear to auscultation, heart with S1 and S2 present and rhythmic, abdomen soft and no lower extremity edema.    Sodium 136, potassium 4.4, chloride 95, bicarb 29, glucose 128, BUN 41, creatinine 4.90, lipase 44. High sensitive troponin 505, white count 8.1, hemoglobin 8.3, hematocrit 25.5, platelets 125. SARS COVID-19 negative.   Chest radiograph with hilar vascular congestion (personally reviewed).    EKG 101 bpm, normal axis and normal intervals, sinus rhythm with PAC, J point elevation V1-V3, ST depression I-AVL, V5 and V6, lead II and III.   Patient was placed on heparin drip, and nitroglycerin drip.    Positive volume overload with acute hypoxic respiratory failure, he was placed on on invasive mechanical ventilation and underwent urgent HD 05/23/21.   Echocardiogram with inferior lateral hypokinesis, EF LV 45%, moderate cavity dilatation. RV systolic functio normal. Severe aortic stenosis.    01/10 patient was transferred to [facility] for further cardiac workup.  Continue to have symptoms of dyspnea and chest pressure.    Assessment & Plan:   Principal Problem:   NSTEMI (non-ST elevated myocardial infarction) ([place]) Active Problems:    HYPERLIPIDEMIA, MIXED   Essential hypertension   S/P CABG x 2   History of bladder cancer   End-stage renal disease on hemodialysis (HCC)   Hypothyroidism (acquired)   Aortic stenosis       NSTEMI, sp CABGx2, severe symptomatic aortic stenosis, dyslipidemia At home had progressive chest pain on exertion for 3 months and on admission presented symptoms at rest.    Patient continue to have dyspnea on exertion and chest pain during his hospitalization. He is off nitroglycerin drip.  Patient may need TAVR for symptomatic aortic stenosis. procedure has been delayed due to anemia.   With known CAD will target a goal Hgb above 8, will plan for PRBC transfusion today during hemodialysis.  Worsening chest pain with elevated troponin 1,658 New EKG personally reviewed with 65 bpm, normal axis, normal intervals, sinus rhythm, ST depression V4 to V6, negative T wave lead AVL and lead I.    On aspirin/ clopidogrel and atorvastatin  Continue with carvedilol.  On heparin drip per cardiology recommendations, possible cardiac catheterization today.  He will need ultrafiltration before catheterization in order to stay supine for procedure.    2. Acute pulmonary edema, with acute hypoxemic respiratory failure, volume overload ESRD on HD. Hyponatremia.  Positive dyspnea, plan for HD and ultrafiltration today.    Anemia of chronic renal disease, follow up Hgb is 7,9 with hct at 23,9, in the setting of CAD will target a Hgb greater than 8. Plan for PRBC transfusion today on HD  Continue close follow up on electrolytes.     3. HTN.  Continue blood pressure control with metoprolol.  4. Hypothyroid.  On levothyroxine    Patient continue to be at high risk for worsening ischemia   Status is: Inpatient  Remains inpatient appropriate because: IV heparin and cardiac catheterization     DVT prophylaxis:  Heparin   Code Status:    full  Family Communication:   No family at the bedside        Consultants:  Cardiology    Subjective: Patient with positive chest pain and dyspnea, no nausea or vomiting. Positive orthopnea.   Objective: Vitals:   05/28/21 0222 05/28/21 0355 05/28/21 0400 05/28/21 0857  BP:  (!) 174/60 (!) 170/60   Pulse:  77 73   Resp:   (!) 22 20  Temp:   (!) 97.4 F (36.3 C)   TempSrc:   Oral   SpO2:  94% 100%   Weight: 81.7 kg     Height:        Intake/Output Summary (Last 24 hours) at 05/28/2021 1117 Last data filed at 05/28/2021 0724 Gross per 24 hour  Intake 1634.79 ml  Output 140 ml  Net 1494.79 ml   Filed Weights   05/26/21 2315 05/27/21 0007 05/28/21 0222  Weight: 80.8 kg 80.4 kg 81.7 kg    Examination:   General: Not in pain or dyspnea, deconditioned  Neurology: Awake and alert, non focal  E ENT: positive pallor, no icterus, oral mucosa moist Cardiovascular: No JVD. S1-S2 present, rhythmic, no gallops, rubs, or murmurs. No lower extremity edema. Pulmonary: positive breath sounds bilaterally, bibasilar rales with no wheezing Gastrointestinal. Abdomen soft and non tender Skin. No rashes Musculoskeletal: no joint deformities     Data Reviewed: I have personally reviewed following labs and imaging studies  CBC: Recent Labs  Lab 05/24/21 0116 05/25/21 0513 05/26/21 0344 05/27/21 0402 05/28/21 0230  WBC 8.6 4.9 5.2 4.3 4.1  HGB 8.1* 7.0* 8.1* 7.7* 7.9*  HCT 25.3* 21.0* 24.4* 22.9* 23.9*  MCV 95.1 96.8 94.2 94.2 94.8  PLT 138* 119* 127* 129* 229*   Basic Metabolic Panel: Recent Labs  Lab 05/23/21 0155 05/23/21 0217 05/24/21 0116 05/25/21 0513 05/27/21 0419 05/28/21 0230  NA 132* 136 136 133* 135 133*  K 4.7 4.4 3.5 4.1 3.7 4.4  CL 96* 95* 96* 96* 94* 94*  CO2 27 29 30 26 28 25   GLUCOSE 105* 128* 109* 93 90 94  BUN 41* 41* 20 53* 18 35*  CREATININE 4.90* 4.90* 2.95* 4.94* 2.98* 4.73*  CALCIUM 8.7* 8.8* 8.9 8.2* 8.8* 9.0  MG  --   --   --  2.2  --   --   PHOS 1.5*  --  1.8*  --   --   --     GFR: Estimated Creatinine Clearance: 13.7 mL/min (A) (by C-G formula based on SCr of 4.73 mg/dL (H)). Liver Function Tests: Recent Labs  Lab 05/23/21 0155 05/23/21 0217 05/24/21 0116  AST  --  18  --   ALT  --  12  --   ALKPHOS  --  93  --   BILITOT  --  0.8  --   PROT  --  7.1  --   ALBUMIN 2.8* 3.3* 3.2*   Recent Labs  Lab 05/23/21 0217  LIPASE 44   No results for input(s): AMMONIA in the last 168 hours. Coagulation Profile: Recent Labs  Lab 05/27/21 0402  INR 1.1   Cardiac Enzymes: No results for input(s): CKTOTAL, CKMB, CKMBINDEX, TROPONINI in the last 168 hours. BNP (last 3 results)  No results for input(s): PROBNP in the last 8760 hours. HbA1C: No results for input(s): HGBA1C in the last 72 hours. CBG: No results for input(s): GLUCAP in the last 168 hours. Lipid Profile: No results for input(s): CHOL, HDL, LDLCALC, TRIG, CHOLHDL, LDLDIRECT in the last 72 hours. Thyroid Function Tests: No results for input(s): TSH, T4TOTAL, FREET4, T3FREE, THYROIDAB in the last 72 hours. Anemia Panel: No results for input(s): VITAMINB12, FOLATE, FERRITIN, TIBC, IRON, RETICCTPCT in the last 72 hours.    Radiology Studies: I have reviewed all of the imaging during this hospital visit personally     Scheduled Meds:  sodium chloride   Intravenous Once   sodium chloride   Intravenous Once   aspirin EC  81 mg Oral Daily   atorvastatin  80 mg Oral Daily   carvedilol  6.25 mg Oral BID   Chlorhexidine Gluconate Cloth  6 each Topical Daily   clopidogrel  75 mg Oral Daily   levothyroxine  50 mcg Oral QAC breakfast   pantoprazole  40 mg Oral BID   sodium chloride flush  3 mL Intravenous Q12H   Continuous Infusions:  sodium chloride     sodium chloride     sodium chloride     sodium chloride     sodium chloride 10 mL/hr at 05/28/21 0620   heparin 1,650 Units/hr (05/28/21 0620)     LOS: 5 days        Leone Putman Gerome Apley, MD

## 2021-05-28 NOTE — Progress Notes (Signed)
Progress Note  Patient Name: [name] Date of Encounter: 05/28/2021  Primary Cardiologist: Carlyle Dolly, MD   Subjective   Patient was seen and examined at his bedside.  He was sitting up in bed and reports he is having difficulty breathing.  Inpatient Medications    Scheduled Meds:  sodium chloride   Intravenous Once   sodium chloride   Intravenous Once   aspirin EC  81 mg Oral Daily   atorvastatin  80 mg Oral Daily   carvedilol  6.25 mg Oral BID   Chlorhexidine Gluconate Cloth  6 each Topical Daily   clopidogrel  75 mg Oral Daily   levothyroxine  50 mcg Oral QAC breakfast   pantoprazole  40 mg Oral BID   sodium chloride flush  3 mL Intravenous Q12H   Continuous Infusions:  sodium chloride     sodium chloride     sodium chloride     sodium chloride     sodium chloride 10 mL/hr at 05/28/21 0620   heparin 1,650 Units/hr (05/28/21 0620)   PRN Meds: sodium chloride, sodium chloride, sodium chloride, sodium chloride, acetaminophen **OR** acetaminophen, HYDROmorphone (DILAUDID) injection, lidocaine (PF), lidocaine-prilocaine, melatonin, nitroGLYCERIN, ondansetron **OR** ondansetron (ZOFRAN) IV, pentafluoroprop-tetrafluoroeth, sodium chloride flush   Vital Signs    Vitals:   05/28/21 0355 05/28/21 0400 05/28/21 0857 05/28/21 1124  BP: (!) 174/60 (!) 170/60  130/84  Pulse: 77 73  77  Resp:  (!) 22 20 20   Temp:  (!) 97.4 F (36.3 C)  (!) 97.3 F (36.3 C)  TempSrc:  Oral  Oral  SpO2: 94% 100%    Weight:    81.5 kg  Height:        Intake/Output Summary (Last 24 hours) at 05/28/2021 1144 Last data filed at 05/28/2021 0724 Gross per 24 hour  Intake 1634.79 ml  Output 140 ml  Net 1494.79 ml   Filed Weights   05/27/21 0007 05/28/21 0222 05/28/21 1124  Weight: 80.4 kg 81.7 kg 81.5 kg    Telemetry    Sinus rhythm - Personally Reviewed  ECG    None today - Personally Reviewed  Physical Exam    General: Comfortable, sitting up in bed Head:  Atraumatic, normal size  Eyes: PEERLA, EOMI  Neck: Supple, normal JVD Cardiac: Normal S1, S2; RRR; 2/6 mid systolic ejection murmurs, rubs, or gallops Lungs: Clear to auscultation bilaterally Abd: Soft, nontender, no hepatomegaly  Ext: warm, no edema Musculoskeletal: No deformities, BUE and BLE strength normal and equal Skin: Warm and dry, no rashes   Neuro: Alert and oriented to person, place, time, and situation, CNII-XII grossly intact, no focal deficits  Psych: Normal mood and affect   Labs    Chemistry Recent Labs  Lab 05/23/21 0155 05/23/21 0217 05/24/21 0116 05/25/21 0513 05/27/21 0419 05/28/21 0230  NA 132* 136 136 133* 135 133*  K 4.7 4.4 3.5 4.1 3.7 4.4  CL 96* 95* 96* 96* 94* 94*  CO2 27 29 30 26 28 25   GLUCOSE 105* 128* 109* 93 90 94  BUN 41* 41* 20 53* 18 35*  CREATININE 4.90* 4.90* 2.95* 4.94* 2.98* 4.73*  CALCIUM 8.7* 8.8* 8.9 8.2* 8.8* 9.0  PROT  --  7.1  --   --   --   --   ALBUMIN 2.8* 3.3* 3.2*  --   --   --   AST  --  18  --   --   --   --   ALT  --  12  --   --   --   --   ALKPHOS  --  93  --   --   --   --   BILITOT  --  0.8  --   --   --   --   GFRNONAA 12* 12* 22* 12* 21* 12*  ANIONGAP 9 12 10 11 13 14      Hematology Recent Labs  Lab 05/26/21 0344 05/27/21 0402 05/28/21 0230  WBC 5.2 4.3 4.1  RBC 2.59* 2.43* 2.52*  HGB 8.1* 7.7* 7.9*  HCT 24.4* 22.9* 23.9*  MCV 94.2 94.2 94.8  MCH 31.3 31.7 31.3  MCHC 33.2 33.6 33.1  RDW 15.2 14.8 14.8  PLT 127* 129* 132*    Cardiac EnzymesNo results for input(s): TROPONINI in the last 168 hours. No results for input(s): TROPIPOC in the last 168 hours.   BNP Recent Labs  Lab 05/23/21 0417  BNP 3,900.0*     DDimer No results for input(s): DDIMER in the last 168 hours.   Radiology    DG Chest Port 1 View  Result Date: 05/28/2021 CLINICAL DATA:  Shortness of breath EXAM: PORTABLE CHEST 1 VIEW COMPARISON:  05/24/2021 FINDINGS: Cardiomegaly. Prior CABG and aortic valve replacement. Diffuse  hazy and interstitial opacity. A small right pleural effusion is likely. No pneumothorax. IMPRESSION: CHF pattern. Electronically Signed   By: Jorje Guild M.D.   On: 05/28/2021 05:41    Cardiac Studies   Cardiac Catheterization [facility]): 06/2017 Findings:  1. Severe native CAD with occlusion of the LAD at the mid-portion and 80%  proximal RCA disease  2. Patent LIMA-LAD, LRA graft-distal RCA, SVG-OM/D1.  3. Occlusion of the RIMA mid-graft, though this was semi-engaged.  4. Diffuse plaque and acute on chronic occlusion of the SVG-D2. This is  the likely culprit vessel for  he NSTEMI but SVG is do severely diseased,  that percutaneous intervention is likely not beneficial.  5. Chronic occlusion of SVG-rPL.   Recommendations:  1. Medical Management of complex CAD and NSTEMI.     Echocardiogram: 05/24/2021 IMPRESSIONS    1. Inferolateral hypokinesis.. Left ventricular ejection fraction, by  estimation, is 45%%. The left ventricular internal cavity size was  moderately dilated. There is mild left ventricular hypertrophy. Left  ventricular diastolic parameters are consistent   with Grade II diastolic dysfunction (pseudonormalization). Elevated left  atrial pressure.   2. Right ventricular systolic function is normal. The right ventricular  size is normal. There is moderately elevated pulmonary artery systolic  pressure.   3. Left atrial size was moderately dilated.   4. Right atrial size was mildly dilated.   5. Mild to moderate mitral valve regurgitation.   6. AV is thickened, calcified with restricted motion. Peak and mean  gradients through the valve are 41 and 24 mm Hg respectively. AVA (VTI) is  0.92 cm2 Dimensionless index is 0.24 consistent with severe AS. Note SVI  is 38, lower end of normal.. Aortic  valve regurgitation is mild to moderate.   7. The inferior vena cava is normal in size with greater than 50%  respiratory variability, suggesting right atrial  pressure of 3 mmHg.   Patient Profile     76 y.o. 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   Assessment & Plan    CAD s/p CABG with recent NSTEMI -on heparin drip, plan for left heart catheterization today.  We will get hemodialysis first given the fact that he is short of breath which is secondary to his pulmonary edema.  Further recommendations pending left heart cath.  Aortic stenosis-pending TAVR work-up.  Left heart catheterization planned for tomorrow.  Acute Hypoxic respiratory failure-was noted to be in the setting of his pulmonary edema, this has resolved.  Anemia-hemoglobin 8.1 today, status post 1 unit of PRBC.  No bleeding  NSVT-continue beta-blocker  Acute Exacerbation of heart failure with reduced ejection fraction -clinically appears to be euvolemic he has been managed with hemodialysis as well.   For questions or updates, please contact [place] Please consult [email or web address] for contact info under Cardiology/STEMI.      Signed, Berniece Salines, DO  05/28/2021, 11:44 AM

## 2021-05-28 NOTE — H&P (View-Only) (Signed)
Progress Note  Patient Name: [name] Date of Encounter: 05/28/2021  Primary Cardiologist: Carlyle Dolly, MD   Subjective   Patient was seen and examined at his bedside.  He was sitting up in bed and reports he is having difficulty breathing.  Inpatient Medications    Scheduled Meds:  sodium chloride   Intravenous Once   sodium chloride   Intravenous Once   aspirin EC  81 mg Oral Daily   atorvastatin  80 mg Oral Daily   carvedilol  6.25 mg Oral BID   Chlorhexidine Gluconate Cloth  6 each Topical Daily   clopidogrel  75 mg Oral Daily   levothyroxine  50 mcg Oral QAC breakfast   pantoprazole  40 mg Oral BID   sodium chloride flush  3 mL Intravenous Q12H   Continuous Infusions:  sodium chloride     sodium chloride     sodium chloride     sodium chloride     sodium chloride 10 mL/hr at 05/28/21 0620   heparin 1,650 Units/hr (05/28/21 0620)   PRN Meds: sodium chloride, sodium chloride, sodium chloride, sodium chloride, acetaminophen **OR** acetaminophen, HYDROmorphone (DILAUDID) injection, lidocaine (PF), lidocaine-prilocaine, melatonin, nitroGLYCERIN, ondansetron **OR** ondansetron (ZOFRAN) IV, pentafluoroprop-tetrafluoroeth, sodium chloride flush   Vital Signs    Vitals:   05/28/21 0355 05/28/21 0400 05/28/21 0857 05/28/21 1124  BP: (!) 174/60 (!) 170/60  130/84  Pulse: 77 73  77  Resp:  (!) 22 20 20   Temp:  (!) 97.4 F (36.3 C)  (!) 97.3 F (36.3 C)  TempSrc:  Oral  Oral  SpO2: 94% 100%    Weight:    81.5 kg  Height:        Intake/Output Summary (Last 24 hours) at 05/28/2021 1144 Last data filed at 05/28/2021 0724 Gross per 24 hour  Intake 1634.79 ml  Output 140 ml  Net 1494.79 ml   Filed Weights   05/27/21 0007 05/28/21 0222 05/28/21 1124  Weight: 80.4 kg 81.7 kg 81.5 kg    Telemetry    Sinus rhythm - Personally Reviewed  ECG    None today - Personally Reviewed  Physical Exam    General: Comfortable, sitting up in bed Head:  Atraumatic, normal size  Eyes: PEERLA, EOMI  Neck: Supple, normal JVD Cardiac: Normal S1, S2; RRR; 2/6 mid systolic ejection murmurs, rubs, or gallops Lungs: Clear to auscultation bilaterally Abd: Soft, nontender, no hepatomegaly  Ext: warm, no edema Musculoskeletal: No deformities, BUE and BLE strength normal and equal Skin: Warm and dry, no rashes   Neuro: Alert and oriented to person, place, time, and situation, CNII-XII grossly intact, no focal deficits  Psych: Normal mood and affect   Labs    Chemistry Recent Labs  Lab 05/23/21 0155 05/23/21 0217 05/24/21 0116 05/25/21 0513 05/27/21 0419 05/28/21 0230  NA 132* 136 136 133* 135 133*  K 4.7 4.4 3.5 4.1 3.7 4.4  CL 96* 95* 96* 96* 94* 94*  CO2 27 29 30 26 28 25   GLUCOSE 105* 128* 109* 93 90 94  BUN 41* 41* 20 53* 18 35*  CREATININE 4.90* 4.90* 2.95* 4.94* 2.98* 4.73*  CALCIUM 8.7* 8.8* 8.9 8.2* 8.8* 9.0  PROT  --  7.1  --   --   --   --   ALBUMIN 2.8* 3.3* 3.2*  --   --   --   AST  --  18  --   --   --   --   ALT  --  12  --   --   --   --   ALKPHOS  --  93  --   --   --   --   BILITOT  --  0.8  --   --   --   --   GFRNONAA 12* 12* 22* 12* 21* 12*  ANIONGAP 9 12 10 11 13 14      Hematology Recent Labs  Lab 05/26/21 0344 05/27/21 0402 05/28/21 0230  WBC 5.2 4.3 4.1  RBC 2.59* 2.43* 2.52*  HGB 8.1* 7.7* 7.9*  HCT 24.4* 22.9* 23.9*  MCV 94.2 94.2 94.8  MCH 31.3 31.7 31.3  MCHC 33.2 33.6 33.1  RDW 15.2 14.8 14.8  PLT 127* 129* 132*    Cardiac EnzymesNo results for input(s): TROPONINI in the last 168 hours. No results for input(s): TROPIPOC in the last 168 hours.   BNP Recent Labs  Lab 05/23/21 0417  BNP 3,900.0*     DDimer No results for input(s): DDIMER in the last 168 hours.   Radiology    DG Chest Port 1 View  Result Date: 05/28/2021 CLINICAL DATA:  Shortness of breath EXAM: PORTABLE CHEST 1 VIEW COMPARISON:  05/24/2021 FINDINGS: Cardiomegaly. Prior CABG and aortic valve replacement. Diffuse  hazy and interstitial opacity. A small right pleural effusion is likely. No pneumothorax. IMPRESSION: CHF pattern. Electronically Signed   By: Jorje Guild M.D.   On: 05/28/2021 05:41    Cardiac Studies   Cardiac Catheterization [facility]): 06/2017 Findings:  1. Severe native CAD with occlusion of the LAD at the mid-portion and 80%  proximal RCA disease  2. Patent LIMA-LAD, LRA graft-distal RCA, SVG-OM/D1.  3. Occlusion of the RIMA mid-graft, though this was semi-engaged.  4. Diffuse plaque and acute on chronic occlusion of the SVG-D2. This is  the likely culprit vessel for  he NSTEMI but SVG is do severely diseased,  that percutaneous intervention is likely not beneficial.  5. Chronic occlusion of SVG-rPL.   Recommendations:  1. Medical Management of complex CAD and NSTEMI.     Echocardiogram: 05/24/2021 IMPRESSIONS    1. Inferolateral hypokinesis.. Left ventricular ejection fraction, by  estimation, is 45%%. The left ventricular internal cavity size was  moderately dilated. There is mild left ventricular hypertrophy. Left  ventricular diastolic parameters are consistent   with Grade II diastolic dysfunction (pseudonormalization). Elevated left  atrial pressure.   2. Right ventricular systolic function is normal. The right ventricular  size is normal. There is moderately elevated pulmonary artery systolic  pressure.   3. Left atrial size was moderately dilated.   4. Right atrial size was mildly dilated.   5. Mild to moderate mitral valve regurgitation.   6. AV is thickened, calcified with restricted motion. Peak and mean  gradients through the valve are 41 and 24 mm Hg respectively. AVA (VTI) is  0.92 cm2 Dimensionless index is 0.24 consistent with severe AS. Note SVI  is 38, lower end of normal.. Aortic  valve regurgitation is mild to moderate.   7. The inferior vena cava is normal in size with greater than 50%  respiratory variability, suggesting right atrial  pressure of 3 mmHg.   Patient Profile     76 y.o. 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   Assessment & Plan    CAD s/p CABG with recent NSTEMI -on heparin drip, plan for left heart catheterization today.  We will get hemodialysis first given the fact that he is short of breath which is secondary to his pulmonary edema.  Further recommendations pending left heart cath.  Aortic stenosis-pending TAVR work-up.  Left heart catheterization planned for tomorrow.  Acute Hypoxic respiratory failure-was noted to be in the setting of his pulmonary edema, this has resolved.  Anemia-hemoglobin 8.1 today, status post 1 unit of PRBC.  No bleeding  NSVT-continue beta-blocker  Acute Exacerbation of heart failure with reduced ejection fraction -clinically appears to be euvolemic he has been managed with hemodialysis as well.   For questions or updates, please contact [place] Please consult [email or web address] for contact info under Cardiology/STEMI.      Signed, Berniece Salines, DO  05/28/2021, 11:44 AM

## 2021-05-28 NOTE — Progress Notes (Addendum)
HOSPITAL MEDICINE OVERNIGHT EVENT NOTE    Nursing reports the patient had an episode of severe shortness of breath upon attempting to ambulate to the bathroom.  During this episode the patient was diaphoretic and short of breath but denied any associated chest pain.  Vital signs included blood pressure 170/60 with heart rate of 73 and respiratory of 22.  Patient was not hypoxic but was placed on supplemental oxygen for comfort.    Nursing reports no associated arrhythmias with this episode.  Obtaining EKG, troponin, chest x-ray.  Monitoring for recurrence.  Vernelle Emerald  MD [facility]

## 2021-05-28 NOTE — Progress Notes (Signed)
°  [facility] Progress Note   Assessment/ Plan:     ESRD [facility] MWF  1. NSTEMI: on hep gtt, cath hopefully today. 2.ESRD: MWF, short HD 05/26/21 had HD at [facility] 05/25/21.  New EDW needs to be established.  Next HD today- will make a priority d/t SOB--> getting before cath.   3. Anemia: Hgb improved 7.0--> 8.1 s/p 1 u pRBCs, now 7.7 this AM.  D/w pt- will give one dose of Aranesp today in effort to get Hgb to goal. 4. CKD-MBD: binders  5. Nutrition: renal diet    Subjective:    Had SOB overnight and is sitting in chair this AM--> SOB.     Objective:   BP (!) 136/44    Pulse 65    Temp (!) 97.3 F (36.3 C) (Oral)    Resp (!) 22    Ht 5\' 9"  (1.753 m)    Wt 81.5 kg    SpO2 98%    BMI 26.53 kg/m   Physical Exam: Gen:NAD, sitting upright in chair, appears uncomfortable CVS:RRR Resp: bilateral crackles  Abd: soft Ext: no LE edema ACCESS: RUE AVF + T/B with some prox whistling  Labs: BMET Recent Labs  Lab 05/23/21 0155 05/23/21 0217 05/24/21 0116 05/25/21 0513 05/27/21 0419 05/28/21 0230  NA 132* 136 136 133* 135 133*  K 4.7 4.4 3.5 4.1 3.7 4.4  CL 96* 95* 96* 96* 94* 94*  CO2 27 29 30 26 28 25   GLUCOSE 105* 128* 109* 93 90 94  BUN 41* 41* 20 53* 18 35*  CREATININE 4.90* 4.90* 2.95* 4.94* 2.98* 4.73*  CALCIUM 8.7* 8.8* 8.9 8.2* 8.8* 9.0  PHOS 1.5*  --  1.8*  --   --   --    CBC Recent Labs  Lab 05/25/21 0513 05/26/21 0344 05/27/21 0402 05/28/21 0230  WBC 4.9 5.2 4.3 4.1  HGB 7.0* 8.1* 7.7* 7.9*  HCT 21.0* 24.4* 22.9* 23.9*  MCV 96.8 94.2 94.2 94.8  PLT 119* 127* 129* 132*      Medications:     sodium chloride   Intravenous Once   sodium chloride   Intravenous Once   aspirin EC  81 mg Oral Daily   atorvastatin  80 mg Oral Daily   carvedilol  6.25 mg Oral BID   Chlorhexidine Gluconate Cloth  6 each Topical Daily   clopidogrel  75 mg Oral Daily   levothyroxine  50 mcg Oral QAC breakfast   pantoprazole  40 mg Oral BID   sodium chloride  flush  3 mL Intravenous Q12H     Madelon Lips, MD 05/28/2021, 12:18 PM

## 2021-05-28 NOTE — Progress Notes (Signed)
ANTICOAGULATION CONSULT NOTE - follow up  Pharmacy Consult for heparin Indication: chest pain/ACS  Allergies  Allergen Reactions   Cardizem Cd [Diltiazem Hcl Er Beads] Palpitations    "Makes heart skip"    Patient Measurements: Height: 5\' 9"  (175.3 cm) Weight: 81.5 kg (179 lb 10.8 oz) IBW/kg (Calculated) : 70.7  Heparin dosing weight : 80.4 kg  Vital Signs: Temp: 97.3 F (36.3 C) (01/13 1124) Temp Source: Oral (01/13 1124) BP: 130/84 (01/13 1124) Pulse Rate: 77 (01/13 1124)  Labs: Recent Labs    05/26/21 0344 05/27/21 0402 05/27/21 0419 05/28/21 0230  HGB 8.1* 7.7*  --  7.9*  HCT 24.4* 22.9*  --  23.9*  PLT 127* 129*  --  132*  LABPROT  --  14.4  --   --   INR  --  1.1  --   --   HEPARINUNFRC 0.44 0.65  --  0.59  CREATININE  --   --  2.98* 4.73*  TROPONINIHS  --   --   --  1,658*     Estimated Creatinine Clearance: 13.7 mL/min (A) (by C-G formula based on SCr of 4.73 mg/dL (H)).   Medical History: Past Medical History:  Diagnosis Date   Acute myocardial infarction of other inferior wall, initial episode of care    Anemia    Anginal pain ([place])    Anxiety    Arthritis    Cancer ([place])    bladder 2013 removed, has cystoscopy 10/2016, has returned x 2   COPD (chronic obstructive pulmonary disease) ([place])    Coronary artery disease    Artery bypass graft Jan 2008   Dialysis patient [facility])    Dyspnea    Dysrhythmia    GERD (gastroesophageal reflux disease)    Heart murmur    History of hiatal hernia    Hyperlipidemia    Hypertension    Hypothyroidism    PONV (postoperative nausea and vomiting)    Renal disorder    Sleep apnea    Stroke [facility])    Tobacco user     Assessment: 76yo male c/o "heartburn" associated with diaphoresis but on arrival to ED on 05/23/21 at [facility], the  ECG reveals global ischemia >> troponin elevated >> thus pharmacy consulted to start IV heparin on 05/23/21.   1/9  Echo:  EF 45%, severe aortic stenosis. Transfer to [facility] for heart cath  and further evaluation of his aortic stenosis.  Heparin level  remains therapeutic on heparin infusion at 1650 ut/hr for NSTEMI CBC:  hgb is 7.9 today after transfusion on 1/12.  Transfusing 1 unit PRBCs today.  MD targeting to keep Hgb >8.0. Aranesp SQ given on 1/12 per nephrologist. Pltc low stable.   No bleeding reported.  Cardiac cath has been delayed due to anemia.  Possible cardiac cath today 1/13.   Goal of Therapy:  Heparin level 0.3-0.7 units/ml Monitor platelets by anticoagulation protocol: Yes   Plan:  Continue heparin infusion at 1650 units/hr. Monitor heparin levels daily and CBC.  Follow up post Cath today.    Thank you for allowing pharmacy to be part of this patients care team.  Nicole Cella, [place] Pharmacist [phone] 05/28/2021 11:33 AM Please check AMION for all [place] phone numbers After 10:00 PM, call [place] [phone]

## 2021-05-28 NOTE — Interval H&P Note (Signed)
History and Physical Interval Note:  05/28/2021 2:48 PM  [name]  has presented today for surgery, with the diagnosis of nstemi.  The various methods of treatment have been discussed with the patient and family. After consideration of risks, benefits and other options for treatment, the patient has consented to  Procedure(s): LEFT HEART CATH AND CORS/GRAFTS ANGIOGRAPHY (N/A) as a surgical intervention.  The patient's history has been reviewed, patient examined, no change in status, stable for surgery.  I have reviewed the patient's chart and labs.  Questions were answered to the patient's satisfaction.    Cath Lab Visit (complete for each Cath Lab visit)  Clinical Evaluation Leading to the Procedure:   ACS: Yes.    Non-ACS:    Anginal Classification: CCS III  Anti-ischemic medical therapy: Maximal Therapy (2 or more classes of medications)  Non-Invasive Test Results: No non-invasive testing performed  Prior CABG: Previous CABG        Lauree Chandler

## 2021-05-28 NOTE — Procedures (Signed)
Patient seen and examined on Hemodialysis. BP (!) 148/49    Pulse 64    Temp (!) 97.3 F (36.3 C) (Oral)    Resp (!) 24    Ht 5\' 9"  (1.753 m)    Wt 81.5 kg    SpO2 98%    BMI 26.53 kg/m   QB 400 mL/ min via AVF, UF goal 2L  Tolerating treatment without complaints at this time.  Madelon Lips MD [facility] 12:50 PM

## 2021-05-28 NOTE — Progress Notes (Signed)
Patient had an episode of SHOB while he was up/walking to use the bathroom. Per patient, he started to get very [facility] and was gasping for air. He became nauseous and started to dry heave. When RN came to the room he was very diaphoretic, drenched in sweat. Patient feels better now, nasal cannula paced, sats WNL. EKG done just to check D/T history of elevated troponins and here with nstemi. Provider notified.

## 2021-05-28 NOTE — Progress Notes (Signed)
Pt states he will place himself on CPAP. RT informed pt that if he needed help to let RT/RN know. RT will cont to monitor.

## 2021-05-29 LAB — BASIC METABOLIC PANEL
Anion gap: 12 (ref 5–15)
BUN: 29 mg/dL — ABNORMAL HIGH (ref 8–23)
CO2: 28 mmol/L (ref 22–32)
Calcium: 8.9 mg/dL (ref 8.9–10.3)
Chloride: 94 mmol/L — ABNORMAL LOW (ref 98–111)
Creatinine, Ser: 4.4 mg/dL — ABNORMAL HIGH (ref 0.61–1.24)
GFR, Estimated: 13 mL/min — ABNORMAL LOW (ref >60)
Glucose, Bld: 95 mg/dL (ref 70–99)
Potassium: 3.8 mmol/L (ref 3.5–5.1)
Sodium: 134 mmol/L — ABNORMAL LOW (ref 135–145)

## 2021-05-29 LAB — TYPE AND SCREEN
ABO/RH(D): A POS
Antibody Screen: NEGATIVE
Unit division: 0

## 2021-05-29 LAB — BPAM RBC
Blood Product Expiration Date: 202302072359
ISSUE DATE / TIME: 202301131201
Unit Type and Rh: 6200

## 2021-05-29 LAB — CBC
HCT: 25.5 % — ABNORMAL LOW (ref 39.0–52.0)
Hemoglobin: 8.5 g/dL — ABNORMAL LOW (ref 13.0–17.0)
MCH: 31 pg (ref 26.0–34.0)
MCHC: 33.3 g/dL (ref 30.0–36.0)
MCV: 93.1 fL (ref 80.0–100.0)
Platelets: 128 10*3/uL — ABNORMAL LOW (ref 150–400)
RBC: 2.74 MIL/uL — ABNORMAL LOW (ref 4.22–5.81)
RDW: 15.6 % — ABNORMAL HIGH (ref 11.5–15.5)
WBC: 3.9 10*3/uL — ABNORMAL LOW (ref 4.0–10.5)
nRBC: 0 % (ref 0.0–0.2)

## 2021-05-29 MED ORDER — FUROSEMIDE 40 MG PO TABS
80.0000 mg | ORAL_TABLET | Freq: Every day | ORAL | Status: DC
  Administered 2021-05-29: 80 mg via ORAL
  Filled 2021-05-29: qty 2

## 2021-05-29 MED ORDER — CARVEDILOL 6.25 MG PO TABS: 6.2500 mg | ORAL_TABLET | Freq: Two times a day (BID) | ORAL | 0 refills | Status: AC

## 2021-05-29 MED ORDER — ASPIRIN 81 MG PO TBEC: 81.0000 mg | DELAYED_RELEASE_TABLET | Freq: Every day | ORAL | 0 refills | Status: DC

## 2021-05-29 NOTE — Progress Notes (Signed)
Progress Note  Patient Name: [name] Date of Encounter: 05/29/2021  Primary Cardiologist:   Carlyle Dolly, MD   Subjective   No chest pain or SOB.   Inpatient Medications    Scheduled Meds:  sodium chloride   Intravenous Once   sodium chloride   Intravenous Once   aspirin EC  81 mg Oral Daily   atorvastatin  80 mg Oral Daily   carvedilol  6.25 mg Oral BID   Chlorhexidine Gluconate Cloth  6 each Topical Daily   clopidogrel  75 mg Oral Daily   levothyroxine  50 mcg Oral QAC breakfast   pantoprazole  40 mg Oral BID   sodium chloride flush  3 mL Intravenous Q12H   sodium chloride flush  3 mL Intravenous Q12H   Continuous Infusions:  sodium chloride     sodium chloride     sodium chloride     sodium chloride     PRN Meds: sodium chloride, sodium chloride, sodium chloride, sodium chloride, acetaminophen **OR** acetaminophen, HYDROmorphone (DILAUDID) injection, lidocaine (PF), lidocaine-prilocaine, melatonin, nitroGLYCERIN, ondansetron **OR** ondansetron (ZOFRAN) IV, pentafluoroprop-tetrafluoroeth, simethicone, sodium chloride flush   Vital Signs    Vitals:   05/28/21 2042 05/28/21 2122 05/29/21 0406 05/29/21 1001  BP:  (!) 139/59 112/69 (!) 135/58  Pulse: 74 76 73 64  Resp:   18   Temp:   97.9 F (36.6 C)   TempSrc:   Oral   SpO2:   95%   Weight:   79.6 kg   Height:        Intake/Output Summary (Last 24 hours) at 05/29/2021 1110 Last data filed at 05/29/2021 0454 Gross per 24 hour  Intake 1458.13 ml  Output 2500 ml  Net -1041.87 ml   Filed Weights   05/28/21 0222 05/28/21 1124 05/29/21 0406  Weight: 81.7 kg 81.5 kg 79.6 kg    Telemetry    NSR, ectopy - Personally Reviewed  ECG    NA - Personally Reviewed  Physical Exam   GEN: No acute distress.   Neck: No  JVD Cardiac: RRR, 3/6 apical systolic murmur, no diastolic murmurs, rubs, or gallops.  Respiratory: Clear  to auscultation bilaterally. GI: Soft, nontender, non-distended  MS: No  edema; No deformity.  Right femoral access site without bleeding or bruising Neuro:  Nonfocal  Psych: Normal affect   Labs    Chemistry Recent Labs  Lab 05/23/21 0155 05/23/21 0217 05/24/21 0116 05/25/21 0513 05/27/21 0419 05/28/21 0230 05/28/21 1528 05/28/21 1533 05/28/21 1537 05/29/21 0428  NA 132* 136 136   < > 135 133*   < > 137 140 134*  K 4.7 4.4 3.5   < > 3.7 4.4   < > 3.3* 3.0* 3.8  CL 96* 95* 96*   < > 94* 94*  --   --   --  94*  CO2 27 29 30    < > 28 25  --   --   --  28  GLUCOSE 105* 128* 109*   < > 90 94  --   --   --  95  BUN 41* 41* 20   < > 18 35*  --   --   --  29*  CREATININE 4.90* 4.90* 2.95*   < > 2.98* 4.73*  --   --   --  4.40*  CALCIUM 8.7* 8.8* 8.9   < > 8.8* 9.0  --   --   --  8.9  PROT  --  7.1  --   --   --   --   --   --   --   --   ALBUMIN 2.8* 3.3* 3.2*  --   --   --   --   --   --   --   AST  --  18  --   --   --   --   --   --   --   --   ALT  --  12  --   --   --   --   --   --   --   --   ALKPHOS  --  93  --   --   --   --   --   --   --   --   BILITOT  --  0.8  --   --   --   --   --   --   --   --   GFRNONAA 12* 12* 22*   < > 21* 12*  --   --   --  13*  ANIONGAP 9 12 10    < > 13 14  --   --   --  12   < > = values in this interval not displayed.     Hematology Recent Labs  Lab 05/27/21 0402 05/28/21 0230 05/28/21 1528 05/28/21 1533 05/28/21 1537 05/29/21 0428  WBC 4.3 4.1  --   --   --  3.9*  RBC 2.43* 2.52*  --   --   --  2.74*  HGB 7.7* 7.9*   < > 8.5* 8.2* 8.5*  HCT 22.9* 23.9*   < > 25.0* 24.0* 25.5*  MCV 94.2 94.8  --   --   --  93.1  MCH 31.7 31.3  --   --   --  31.0  MCHC 33.6 33.1  --   --   --  33.3  RDW 14.8 14.8  --   --   --  15.6*  PLT 129* 132*  --   --   --  128*   < > = values in this interval not displayed.    Cardiac EnzymesNo results for input(s): TROPONINI in the last 168 hours. No results for input(s): TROPIPOC in the last 168 hours.   BNP Recent Labs  Lab 05/23/21 0417  BNP 3,900.0*     DDimer  No results for input(s): DDIMER in the last 168 hours.   Radiology    CARDIAC CATHETERIZATION  Result Date: 05/28/2021   Prox LAD to Mid LAD lesion is 100% stenosed.   Mid LM to Dist LM lesion is 50% stenosed.   Ost Cx to Prox Cx lesion is 50% stenosed.   Prox RCA lesion is 99% stenosed.   RPAV lesion is 80% stenosed.   Mid LAD to Dist LAD lesion is 30% stenosed.   Mid RCA lesion is 50% stenosed.   RIMA graft was visualized by angiography and is normal in caliber.   LIMA graft was visualized by angiography and is normal in caliber.   Left radial artery graft was visualized by angiography and is normal in caliber. Severe three vessel CAD s/p CABG and redo CABG. The most recent CABG in 2018 with Free RIMA graft to Diagonal and right radial artery to posterolateral artery, both patent. The LIMA to the LAD remains patent. I did not engage the old vein grafts which are known to be occluded. The  distal left main has a moderate stenosis The LAD is occluded in the mid segment. Patent LIMA to mid LAD. The free RIMA graft to the Diagonal is patent The Circumflex is not grafted. The ostial Circumflex has moderate stenosis The RCA is a large dominant artery. The proximal RCA has a severe stenosis. Competitive graft flow seen in the mid and distal RCA. The right radial artery graft to the posterolateral artery is patent. This fills the PDA, posterolateral artery and the distal RCA. Aortic stenosis, likely severe low flow/low gradient by echo. Cath data: mean gradient 11 mmHg, peak to peak gradient 14 mmHg) He has no obvious culprit lesions for NSTEMI. Continue medical management of CAD. He likely has severe low flow/low gradient aortic stenosis. We will consider him a candidate for TAVR. If stable, he could be discharged home over the weekend and we could plan pre TAVR CT scans as an outpatient. If he remains inpatient, we could get his CT scans next week. We will follow up Monday and make further plans.   DG Chest Port  1 View  Result Date: 05/28/2021 CLINICAL DATA:  Shortness of breath EXAM: PORTABLE CHEST 1 VIEW COMPARISON:  05/24/2021 FINDINGS: Cardiomegaly. Prior CABG and aortic valve replacement. Diffuse hazy and interstitial opacity. A small right pleural effusion is likely. No pneumothorax. IMPRESSION: CHF pattern. Electronically Signed   By: Jorje Guild M.D.   On: 05/28/2021 05:41    Cardiac Studies    ECHO:   1. Inferolateral hypokinesis.. Left ventricular ejection fraction, by  estimation, is 45%%. The left ventricular internal cavity size was  moderately dilated. There is mild left ventricular hypertrophy. Left  ventricular diastolic parameters are consistent   with Grade II diastolic dysfunction (pseudonormalization). Elevated left  atrial pressure.   2. Right ventricular systolic function is normal. The right ventricular  size is normal. There is moderately elevated pulmonary artery systolic  pressure.   3. Left atrial size was moderately dilated.   4. Right atrial size was mildly dilated.   5. Mild to moderate mitral valve regurgitation.   6. AV is thickened, calcified with restricted motion. Peak and mean  gradients through the valve are 41 and 24 mm Hg respectively. AVA (VTI) is  0.92 cm2 Dimensionless index is 0.24 consistent with severe AS. Note SVI  is 38, lower end of normal.. Aortic  valve regurgitation is mild to moderate.   7. The inferior vena cava is normal in size with greater than 50%  respiratory variability, suggesting right atrial pressure of 3 mmHg.   CATH  Diagnostic Dominance: Right   Patient Profile     76 y.o. 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     Assessment & Plan    CAD s/p CABG with recent NSTEMI:    Anatomy as above.  No acute culprit lesion.  Continue medical management.  I would suggest DAPT post NSTEMI for now.    Aortic stenosis:    Possible low flow severe AS being considered for TAVR.  The Structural Heart Team is aware to arrange follow up.  Acute  Hypoxic respiratory failure:    Resolved pulmonary edema.  TAVR work up as above.    Anemia:    Hbg up post transfusion and holding steady.  No active bleeding.    NSVT:     Continue beta blocker.    Acute exacerbation of heart failure with reduced ejection fraction :   EF as above.  Continue current therapy with volume managed by dialysis.    For questions or updates, please contact [place] Please consult [email or web address] for contact info under Cardiology/STEMI.   Signed, Minus Breeding, MD  05/29/2021, 11:10 AM

## 2021-05-29 NOTE — Discharge Summary (Signed)
Physician Discharge Summary  [name] FXT:[id number] DOB: 1946-02-15 DOA: 05/23/2021  PCP: [facility]  Admit date: 05/23/2021 Discharge date: 05/29/2021  Admitted From: Home  Disposition:  Home   Recommendations for Outpatient Follow-up and new medication changes:  Follow up with [facility]  Follow up as outpatient for cardiology for Pre TAVR CT scans. Keep Hgb above 8 in the setting of CAD and symptomatic aortic stenosis.  Continue with aspirin and clopidogrel for now and have outpatient follow up.   Home Health: na   Equipment/Devices: na    Discharge Condition: stable  CODE STATUS: DNR  Diet recommendation:  heart healthy   Brief/Interim Summary:  [name] was admitted to the hospital with the working diagnosis of NSTEMI in the setting of symptomatic aortic stenosis and symptomatic anemia.  Transferred from [facility] to [facility].    76 yo male with the past medical history of ESRD on HD, bladder cancer 2013, BPH, HTN, dyslipidemia, hypothyroid, cholelithiasis, CAD sp CABG, and GERD who presented with chest pain. Reported burning type chest pain, mid chest, epigastric region, 8/10 in intensity, constant and associated with diaphoresis and dyspnea that prompted him to come to the hospital. Pain worse with exertion. On his initial physical examination his blood pressure was 135/55, HR 84, RR 26 to 28, oxygen saturation 95% on supplemental 02, lungs clear to auscultation, heart with S1 and S2 present and rhythmic, positive murmur systolic 3/6 at the right sternal border, abdomen soft and no lower extremity edema.    Sodium 136, potassium 4.4, chloride 95, bicarb 29, glucose 128, BUN 41, creatinine 4.90, lipase 44. High sensitive troponin 505, white count 8.1, hemoglobin 8.3, hematocrit 25.5, platelets 125. SARS COVID-19 negative.   Chest radiograph with hilar vascular congestion (personally reviewed).    EKG 101 bpm, normal axis and normal intervals, sinus rhythm  with PAC, J point elevation V1-V3, ST depression I-AVL, V5 and V6, lead II and III.   Patient was placed on heparin drip, and nitroglycerin drip.    Positive volume overload with acute hypoxic respiratory failure, he was placed on on invasive mechanical ventilation and underwent urgent HD 05/23/21.   Echocardiogram with inferior lateral hypokinesis, EF LV 45%, moderate cavity dilatation. RV systolic functio normal. Severe aortic stenosis.    01/10 patient was transferred to [facility] for further cardiac workup.   Continue to have symptoms of dyspnea and chest pressure improved with PRBC transfusion and hemodialysis with ultrafiltration.   Cardiac catheterization with no lesions culprit for NSTEMI, but severe aortic stenosis.  Plan for possible TAVR, patient will complete workup a outpatient.      NSTEMI, sp CABGx2, severe symptomatic aortic stenosis, dyslipidemia At home had progressive chest pain on exertion for 3 months and on admission presented symptoms at rest.  Patient was admitted to the cardia unit initially placed on nitroglycerin drip and heparin drip. Underwent emergent HD and then transferred to [facility] for further cardiac work up. Nitroglycerin drip was discontinue but continue with heparin drip.   Patient with symptomatic aortic stenosis, exacerbated by symptomatic anemia. Further work up with echocardiogram showed LV EF 45%, moderate cavity dilatation, RV systolic function preserved. Severe aortic stenosis.  Cardiac catheterization with positive coronary artery stenosis in the native vessels, grafts with normal caliber.   Plan for possible TAVR, will need pre operative scans.    Continue medical management with aspirin, clopidogrel, carvedilol (dose increased to 6,25 mg bid) and atorvastatin. Continue with isosorbide and as needed nitroglycerin  2. Acute pulmonary edema, with acute hypoxemic respiratory failure, volume overload ESRD on HD. Hyponatremia/ hypokalemia.  Acute diastolic  heart failure exacerbation.  Volume status improved with ultrafiltration.  At discharge no dyspnea, his oxygenation was 95% on 1 L/min Plan to continue hemodialysis as outpatient.   At discharge Na 134, K 3,8 and serum bicarbonate at 28.  Continue with furosemide on non dialysis days.    3. HTN/ dyslipidemia  Blood pressure control with metoprolol.  Continue with statin therapy.    4. Hypothyroid.  Continue with levothyroxine   5. Symptomatic anemia, anemia of chronic renal disease.  Patient received one unit PRBC during hemodialysis with good toleration. Improvement in dyspnea. His discharge hgb is 8,5 and hct is 25,5/ Patient has been resumed on EPO.  Follow up hgb as outpatient, target hgb above 8 in the setting of coronary artery disease and symptomatic aortic stenosis.   6. Bladder cancer/ BPH. Follow up as outpatient.  Continue with finesteride and tamsulosin  7. Gout. Continue with allopurinol.   Discharge Diagnoses:  Principal Problem:   NSTEMI (non-ST elevated myocardial infarction) ([place]) Active Problems:   HYPERLIPIDEMIA, MIXED   Essential hypertension   S/P CABG x 2   History of bladder cancer   End-stage renal disease on hemodialysis ([place])   Hypothyroidism (acquired)   Aortic stenosis    Discharge Instructions   Allergies as of 05/29/2021       Reactions   Cardizem Cd [diltiazem Hcl Er Beads] Palpitations   "Makes heart skip"        Medication List     STOP taking these medications    Vitamin D (Cholecalciferol) 25 MCG (1000 UT) Caps       TAKE these medications    acetaminophen 325 MG tablet Commonly known as: TYLENOL Take 2 tablets (650 mg total) by mouth every 6 (six) hours as needed for mild pain.   allopurinol 100 MG tablet Commonly known as: ZYLOPRIM Take 100 mg by mouth daily.   aspirin 81 MG EC tablet Take 1 tablet (81 mg total) by mouth daily. Swallow whole. Start taking on: May 30, 2021   atorvastatin 80 MG  tablet Commonly known as: LIPITOR Take 80 mg by mouth daily.   carvedilol 6.25 MG tablet Commonly known as: COREG Take 1 tablet (6.25 mg total) by mouth 2 (two) times daily. What changed:  medication strength how much to take   clopidogrel 75 MG tablet Commonly known as: PLAVIX Take 75 mg by mouth daily.   finasteride 5 MG tablet Commonly known as: PROSCAR Take 5 mg by mouth daily.   folic acid 1 MG tablet Commonly known as: FOLVITE Take 1 mg by mouth daily.   furosemide 80 MG tablet Commonly known as: LASIX Take 1 tablet (80 mg total) by mouth every Tuesday, Thursday, Saturday, and Sunday.   isosorbide mononitrate 120 MG 24 hr tablet Commonly known as: IMDUR Take 1 tablet (120 mg total) by mouth daily.   levothyroxine 50 MCG tablet Commonly known as: SYNTHROID Take 50 mcg by mouth daily before breakfast.   nitroGLYCERIN 0.4 MG SL tablet Commonly known as: NITROSTAT Place 0.4 mg under the tongue every 5 (five) minutes as needed for chest pain (max 3 doses).   omeprazole 40 MG capsule Commonly known as: PRILOSEC Take 20 mg by mouth daily.   RA Fish Oil 1000 MG Caps Take 2,000 mg by mouth 2 (two) times daily.   tamsulosin 0.4 MG Caps capsule Commonly known as: FLOMAX  Take 0.4 mg by mouth daily.        Allergies  Allergen Reactions   Cardizem Cd [Diltiazem Hcl Er Beads] Palpitations    "Makes heart skip"    Consultations: Nephrology  Cardiology    Procedures/Studies: DG Chest 2 View  Result Date: 05/23/2021 CLINICAL DATA:  Chest pain EXAM: CHEST - 2 VIEW COMPARISON:  04/05/2021 FINDINGS: Prior CABG. Heart is borderline in size. Bilateral perihilar and lower lobe airspace opacities. No effusions. No acute bony abnormality. IMPRESSION: Bilateral perihilar and lower lobe opacities concerning for edema. Electronically Signed   By: Rolm Baptise M.D.   On: 05/23/2021 03:06   NM Hepatobiliary Liver Func  Result Date: 05/11/2021 CLINICAL DATA:  Biliary  colic, right upper quadrant abdominal pain EXAM: NUCLEAR MEDICINE HEPATOBILIARY IMAGING TECHNIQUE: Sequential images of the abdomen were obtained out to 60 minutes following intravenous administration of radiopharmaceutical. RADIOPHARMACEUTICALS:  5.5 mCi Tc-46m  Choletec IV COMPARISON:  Ultrasound and CT examinations from 04/06/2021 and 04/05/2021, respectively FINDINGS: The patient had discomfort that he stated was due to reflux, and had a difficult time cooperating with the examination. We were able to obtain standard images through 15 minutes, after that he refused imaging until the 60 minute images. We obtained a lateral 85 minute image to help try to clarify whether the gallbladder was filling or not, but the patient rejected any further imaging and accordingly morphine administration could not be employed. The initial 15 minute images demonstrate satisfactory uptake of radiopharmaceutical from the blood pool. Biliary activity is visible at 11 minutes. The gallbladder was not visible at 15 minutes. The gallbladder was also not definitively seen at 60 minutes, although there was substantial bowel activity at this time. A collection anterior to the biliary tree on the lateral projection from 60 minutes was questionable for gallbladder activity versus gastric activity, but appeared to flush out when we administered the patient has some water to drink and reperformed imaging at 85 minutes. Accordingly the gallbladder did not fill. IMPRESSION: 1. The CBD is patent and there is satisfactory uptake of radiopharmaceutical from the blood pool. 2. Cystic duct patency is not a stab list. We did not fill the gallbladder out through 85 minutes, but the patient terminated the exam prior to morphine administration and further imaging. Electronically Signed   By: Van Clines M.D.   On: 05/11/2021 14:41   CARDIAC CATHETERIZATION  Result Date: 05/28/2021   Prox LAD to Mid LAD lesion is 100% stenosed.   Mid LM to Dist  LM lesion is 50% stenosed.   Ost Cx to Prox Cx lesion is 50% stenosed.   Prox RCA lesion is 99% stenosed.   RPAV lesion is 80% stenosed.   Mid LAD to Dist LAD lesion is 30% stenosed.   Mid RCA lesion is 50% stenosed.   RIMA graft was visualized by angiography and is normal in caliber.   LIMA graft was visualized by angiography and is normal in caliber.   Left radial artery graft was visualized by angiography and is normal in caliber. Severe three vessel CAD s/p CABG and redo CABG. The most recent CABG in 2018 with Free RIMA graft to Diagonal and right radial artery to posterolateral artery, both patent. The LIMA to the LAD remains patent. I did not engage the old vein grafts which are known to be occluded. The distal left main has a moderate stenosis The LAD is occluded in the mid segment. Patent LIMA to mid LAD. The free RIMA graft to  the Diagonal is patent The Circumflex is not grafted. The ostial Circumflex has moderate stenosis The RCA is a large dominant artery. The proximal RCA has a severe stenosis. Competitive graft flow seen in the mid and distal RCA. The right radial artery graft to the posterolateral artery is patent. This fills the PDA, posterolateral artery and the distal RCA. Aortic stenosis, likely severe low flow/low gradient by echo. Cath data: mean gradient 11 mmHg, peak to peak gradient 14 mmHg) He has no obvious culprit lesions for NSTEMI. Continue medical management of CAD. He likely has severe low flow/low gradient aortic stenosis. We will consider him a candidate for TAVR. If stable, he could be discharged home over the weekend and we could plan pre TAVR CT scans as an outpatient. If he remains inpatient, we could get his CT scans next week. We will follow up Monday and make further plans.   DG Chest Port 1 View  Result Date: 05/28/2021 CLINICAL DATA:  Shortness of breath EXAM: PORTABLE CHEST 1 VIEW COMPARISON:  05/24/2021 FINDINGS: Cardiomegaly. Prior CABG and aortic valve replacement.  Diffuse hazy and interstitial opacity. A small right pleural effusion is likely. No pneumothorax. IMPRESSION: CHF pattern. Electronically Signed   By: Jorje Guild M.D.   On: 05/28/2021 05:41   DG CHEST PORT 1 VIEW  Result Date: 05/24/2021 CLINICAL DATA:  76 year old male with history of end-stage renal disease and bladder cancer presenting with shortness of breath. EXAM: PORTABLE CHEST 1 VIEW COMPARISON:  Chest x-ray 05/23/2021. FINDINGS: Lung volumes are low. No acute consolidative airspace disease. Trace right pleural effusion. No left pleural effusion. No pneumothorax. Mild diffuse peribronchial cuffing and interstitial prominence. Cephalization of the pulmonary vasculature noted on the prior examination has resolved. Heart size is upper limits of normal. Upper mediastinal contours are within normal limits. Atherosclerotic calcifications are noted in the thoracic aorta. Status post median sternotomy for CABG. IMPRESSION: 1. The appearance of the chest suggests resolving pulmonary edema, as above. 2. Trace right pleural effusion. 3. Aortic atherosclerosis. Electronically Signed   By: Vinnie Langton M.D.   On: 05/24/2021 05:24   ECHOCARDIOGRAM COMPLETE  Result Date: 05/24/2021    ECHOCARDIOGRAM REPORT   Patient Name:   [name] Date of Exam: 05/24/2021 Medical Rec #:  [id number]      Height:       69.0 in Accession #:    [id number]     Weight:       181.9 lb Date of Birth:  1945/07/26      BSA:          1.984 m Patient Age:    76 years       BP:           141/28 mmHg Patient Gender: M              HR:           76 bpm. Exam Location:  Inpatient Procedure: 2D Echo, 3D Echo, Cardiac Doppler and Color Doppler                                MODIFIED REPORT:      This report was modified by Dorris Carnes MD on 05/24/2021 due to complete  impression.  Indications:     NSTEMI I21.4  History:         Patient has prior history of Echocardiogram examinations, most                   recent 04/06/2021. Previous Myocardial Infarction and CAD,                  Prior CABG, COPD, Aortic Valve Disease; Risk                  Factors:Dyslipidemia and Hypertension.  Sonographer:     Merrie Roof RDCS Referring Phys:  [place] Diagnosing Phys: Dorris Carnes MD IMPRESSIONS  1. Inferolateral hypokinesis.. Left ventricular ejection fraction, by estimation, is 45%%. The left ventricular internal cavity size was moderately dilated. There is mild left ventricular hypertrophy. Left ventricular diastolic parameters are consistent  with Grade II diastolic dysfunction (pseudonormalization). Elevated left atrial pressure.  2. Right ventricular systolic function is normal. The right ventricular size is normal. There is moderately elevated pulmonary artery systolic pressure.  3. Left atrial size was moderately dilated.  4. Right atrial size was mildly dilated.  5. Mild to moderate mitral valve regurgitation.  6. AV is thickened, calcified with restricted motion. Peak and mean gradients through the valve are 41 and 24 mm Hg respectively. AVA (VTI) is 0.92 cm2 Dimensionless index is 0.24 consistent with severe AS. Note SVI is 38, lower end of normal.. Aortic valve regurgitation is mild to moderate.  7. The inferior vena cava is normal in size with greater than 50% respiratory variability, suggesting right atrial pressure of 3 mmHg. FINDINGS  Left Ventricle: Inferolateral hypokinesis. Left ventricular ejection fraction, by estimation, is 45%%. The left ventricular internal cavity size was moderately dilated. There is mild left ventricular hypertrophy. Left ventricular diastolic parameters are consistent with Grade II diastolic dysfunction (pseudonormalization). Elevated left atrial pressure. Right Ventricle: The right ventricular size is normal. Right vetricular wall thickness was not assessed. Right ventricular systolic function is normal. There is moderately elevated pulmonary artery systolic pressure. The  tricuspid regurgitant velocity is  3.38 m/s, and with an assumed right atrial pressure of 3 mmHg, the estimated right ventricular systolic pressure is 29.5 mmHg. Left Atrium: Left atrial size was moderately dilated. Right Atrium: Right atrial size was mildly dilated. Pericardium: There is no evidence of pericardial effusion. Mitral Valve: There is mild thickening of the mitral valve leaflet(s). Mild to moderate mitral annular calcification. Mild to moderate mitral valve regurgitation. Tricuspid Valve: The tricuspid valve is normal in structure. Tricuspid valve regurgitation is mild. Aortic Valve: AV is thickened, calcified with restricted motion. Peak and mean gradients through the valve are 41 and 24 mm Hg respectively. AVA (VTI) is 0.92 cm2 Dimensionless index is 0.24 consistent with severe AS. Note SVI is 38, lower end of normal.  Aortic valve regurgitation is mild to moderate. Aortic regurgitation PHT measures 230 msec. Aortic valve mean gradient measures 23.5 mmHg. Aortic valve peak gradient measures 41.9 mmHg. Aortic valve area, by VTI measures 0.90 cm. Pulmonic Valve: The pulmonic valve was normal in structure. Pulmonic valve regurgitation is mild. Aorta: The aortic root and ascending aorta are structurally normal, with no evidence of dilitation. Venous: The inferior vena cava is normal in size with greater than 50% respiratory variability, suggesting right atrial pressure of 3 mmHg. IAS/Shunts: No atrial level shunt detected by color flow Doppler.  LEFT VENTRICLE PLAX 2D LVIDd:  5.70 cm      Diastology LVIDs:         4.20 cm      LV e' medial:    3.15 cm/s LV PW:         1.20 cm      LV E/e' medial:  35.6 LV IVS:        1.10 cm      LV e' lateral:   5.66 cm/s LVOT diam:     2.20 cm      LV E/e' lateral: 19.8 LV SV:         75 LV SV Index:   38 LVOT Area:     3.80 cm                              3D Volume EF: LV Volumes (MOD)            3D EF:        44 % LV vol d, MOD A2C: 189.0 ml LV EDV:       252  ml LV vol d, MOD A4C: 197.0 ml LV ESV:       141 ml LV vol s, MOD A2C: 117.0 ml LV SV:        111 ml LV vol s, MOD A4C: 120.0 ml LV SV MOD A2C:     72.0 ml LV SV MOD A4C:     197.0 ml LV SV MOD BP:      73.4 ml RIGHT VENTRICLE            IVC RV Basal diam:  3.90 cm    IVC diam: 1.60 cm RV S prime:     7.95 cm/s TAPSE (M-mode): 2.6 cm LEFT ATRIUM            Index        RIGHT ATRIUM           Index LA diam:      5.10 cm  2.57 cm/m   RA Area:     20.50 cm LA Vol (A2C): 108.0 ml 54.43 ml/m  RA Volume:   63.90 ml  32.20 ml/m  AORTIC VALVE AV Area (Vmax):    1.00 cm AV Area (Vmean):   0.96 cm AV Area (VTI):     0.90 cm AV Vmax:           323.50 cm/s AV Vmean:          232.000 cm/s AV VTI:            0.835 m AV Peak Grad:      41.9 mmHg AV Mean Grad:      23.5 mmHg LVOT Vmax:         85.30 cm/s LVOT Vmean:        58.300 cm/s LVOT VTI:          0.198 m LVOT/AV VTI ratio: 0.24 AI PHT:            230 msec  AORTA Ao Root diam: 3.50 cm Ao Asc diam:  3.20 cm MITRAL VALVE                TRICUSPID VALVE MV Area (PHT): 4.49 cm     TR Peak grad:   45.7 mmHg MV Decel Time: 169 msec     TR Vmax:        338.00 cm/s MV E velocity: 112.00 cm/s MV A velocity:  84.80 cm/s   SHUNTS MV E/A ratio:  1.32         Systemic VTI:  0.20 m                             Systemic Diam: 2.20 cm Dorris Carnes MD Electronically signed by Dorris Carnes MD Signature Date/Time: 05/24/2021/4:07:58 PM    Final (Updated)      Procedures: cardiac catheterization   Subjective: Patient is feeling better, no nausea or vomiting, no chest pain or dyspnea.   Discharge Exam: Vitals:   05/29/21 0406 05/29/21 1001  BP: 112/69 (!) 135/58  Pulse: 73 64  Resp: 18   Temp: 97.9 F (36.6 C)   SpO2: 95%    Vitals:   05/28/21 2042 05/28/21 2122 05/29/21 0406 05/29/21 1001  BP:  (!) 139/59 112/69 (!) 135/58  Pulse: 74 76 73 64  Resp:   18   Temp:   97.9 F (36.6 C)   TempSrc:   Oral   SpO2:   95%   Weight:   79.6 kg   Height:        General: Not in  pain or dyspnea.  Neurology: Awake and alert, non focal  E ENT: no pallor, no icterus, oral mucosa moist Cardiovascular: No JVD. S1-S2 present, rhythmic, no gallops, rubs, or murmurs. No lower extremity edema. Pulmonary: positive breath sounds bilaterally, adequate air movement, no wheezing, rhonchi or rales. Gastrointestinal. Abdomen soft and non tender Skin. No rashes Musculoskeletal: no joint deformities   The results of significant diagnostics from this hospitalization (including imaging, microbiology, ancillary and laboratory) are listed below for reference.     Microbiology: Recent Results (from the past 240 hour(s))  Resp Panel by RT-PCR (Flu A&B, Covid) Nasopharyngeal Swab     Status: None   Collection Time: 05/23/21  4:12 AM   Specimen: Nasopharyngeal Swab; Nasopharyngeal(NP) swabs in vial transport medium  Result Value Ref Range Status   SARS Coronavirus 2 by RT PCR NEGATIVE NEGATIVE Final    Comment: (NOTE) SARS-CoV-2 target nucleic acids are NOT DETECTED.  The SARS-CoV-2 RNA is generally detectable in upper respiratory specimens during the acute phase of infection. The lowest concentration of SARS-CoV-2 viral copies this assay can detect is 138 copies/mL. A negative result does not preclude SARS-Cov-2 infection and should not be used as the sole basis for treatment or other patient management decisions. A negative result may occur with  improper specimen collection/handling, submission of specimen other than nasopharyngeal swab, presence of viral mutation(s) within the areas targeted by this assay, and inadequate number of viral copies(<138 copies/mL). A negative result must be combined with clinical observations, patient history, and epidemiological information. The expected result is Negative.  Fact Sheet for Patients:  EntrepreneurPulse.com.au  Fact Sheet for Healthcare Providers:  IncredibleEmployment.be  This test is no t  yet approved or cleared by the [place] FDA and  has been authorized for detection and/or diagnosis of SARS-CoV-2 by FDA under an Emergency Use Authorization (EUA). This EUA will remain  in effect (meaning this test can be used) for the duration of the COVID-19 declaration under Section 564(b)(1) of the Act, 21 U.S.C.section 360bbb-3(b)(1), unless the authorization is terminated  or revoked sooner.       Influenza A by PCR NEGATIVE NEGATIVE Final   Influenza B by PCR NEGATIVE NEGATIVE Final    Comment: (NOTE) The Xpert Xpress SARS-CoV-2/FLU/RSV plus assay is intended as an aid in  the diagnosis of influenza from Nasopharyngeal swab specimens and should not be used as a sole basis for treatment. Nasal washings and aspirates are unacceptable for Xpert Xpress SARS-CoV-2/FLU/RSV testing.  Fact Sheet for Patients: EntrepreneurPulse.com.au  Fact Sheet for Healthcare Providers: IncredibleEmployment.be  This test is not yet approved or cleared by the [place] FDA and has been authorized for detection and/or diagnosis of SARS-CoV-2 by FDA under an Emergency Use Authorization (EUA). This EUA will remain in effect (meaning this test can be used) for the duration of the COVID-19 declaration under Section 564(b)(1) of the Act, 21 U.S.C. section 360bbb-3(b)(1), unless the authorization is terminated or revoked.  Performed at [facility], [address], [place], [place] [address]   MRSA Next Gen by PCR, Nasal     Status: None   Collection Time: 05/23/21  1:06 PM   Specimen: Nasal Mucosa; Nasal Swab  Result Value Ref Range Status   MRSA by PCR Next Gen NOT DETECTED NOT DETECTED Final    Comment: (NOTE) The GeneXpert MRSA Assay (FDA approved for NASAL specimens only), is one component of a comprehensive MRSA colonization surveillance program. It is not intended to diagnose MRSA infection nor to guide or monitor treatment for MRSA  infections. Test performance is not FDA approved in patients less than 91 years old. Performed at [facility], [address] [place],  [address]      Labs: BNP (last 3 results) Recent Labs    08/10/20 0516 08/30/20 0449 05/23/21 0417  BNP 2,146.0* 2,912.0* 8,657.8*   Basic Metabolic Panel: Recent Labs  Lab 05/23/21 0155 05/23/21 0217 05/24/21 0116 05/25/21 0513 05/27/21 0419 05/28/21 0230 05/28/21 1528 05/28/21 1533 05/28/21 1537 05/29/21 0428  NA 132*   < > 136 133* 135 133* 138 137 140 134*  K 4.7   < > 3.5 4.1 3.7 4.4 3.2* 3.3* 3.0* 3.8  CL 96*   < > 96* 96* 94* 94*  --   --   --  94*  CO2 27   < > 30 26 28 25   --   --   --  28  GLUCOSE 105*   < > 109* 93 90 94  --   --   --  95  BUN 41*   < > 20 53* 18 35*  --   --   --  29*  CREATININE 4.90*   < > 2.95* 4.94* 2.98* 4.73*  --   --   --  4.40*  CALCIUM 8.7*   < > 8.9 8.2* 8.8* 9.0  --   --   --  8.9  MG  --   --   --  2.2  --   --   --   --   --   --   PHOS 1.5*  --  1.8*  --   --   --   --   --   --   --    < > = values in this interval not displayed.   Liver Function Tests: Recent Labs  Lab 05/23/21 0155 05/23/21 0217 05/24/21 0116  AST  --  18  --   ALT  --  12  --   ALKPHOS  --  93  --   BILITOT  --  0.8  --   PROT  --  7.1  --   ALBUMIN 2.8* 3.3* 3.2*   Recent Labs  Lab 05/23/21 0217  LIPASE 44   No results for input(s): AMMONIA  in the last 168 hours. CBC: Recent Labs  Lab 05/25/21 0513 05/26/21 0344 05/27/21 0402 05/28/21 0230 05/28/21 1528 05/28/21 1533 05/28/21 1537 05/29/21 0428  WBC 4.9 5.2 4.3 4.1  --   --   --  3.9*  HGB 7.0* 8.1* 7.7* 7.9* 8.5* 8.5* 8.2* 8.5*  HCT 21.0* 24.4* 22.9* 23.9* 25.0* 25.0* 24.0* 25.5*  MCV 96.8 94.2 94.2 94.8  --   --   --  93.1  PLT 119* 127* 129* 132*  --   --   --  128*   Cardiac Enzymes: No results for input(s): CKTOTAL, CKMB, CKMBINDEX, TROPONINI in the last 168 hours. BNP: Invalid input(s): POCBNP CBG: No results for input(s):  GLUCAP in the last 168 hours. D-Dimer No results for input(s): DDIMER in the last 72 hours. Hgb A1c No results for input(s): HGBA1C in the last 72 hours. Lipid Profile No results for input(s): CHOL, HDL, LDLCALC, TRIG, CHOLHDL, LDLDIRECT in the last 72 hours. Thyroid function studies No results for input(s): TSH, T4TOTAL, T3FREE, THYROIDAB in the last 72 hours.  Invalid input(s): FREET3 Anemia work up No results for input(s): VITAMINB12, FOLATE, FERRITIN, TIBC, IRON, RETICCTPCT in the last 72 hours. Urinalysis    Component Value Date/Time   COLORURINE YELLOW 02/09/2020 [place] 02/09/2020 0958   LABSPEC 1.012 02/09/2020 0958   PHURINE 5.0 02/09/2020 0958   GLUCOSEU NEGATIVE 02/09/2020 0958   HGBUR NEGATIVE 02/09/2020 0958   BILIRUBINUR NEGATIVE 02/09/2020 0958   KETONESUR NEGATIVE 02/09/2020 0958   PROTEINUR 100 (A) 02/09/2020 0958   NITRITE NEGATIVE 02/09/2020 0958   LEUKOCYTESUR NEGATIVE 02/09/2020 0958   Sepsis Labs Invalid input(s): PROCALCITONIN,  WBC,  LACTICIDVEN Microbiology Recent Results (from the past 240 hour(s))  Resp Panel by RT-PCR (Flu A&B, Covid) Nasopharyngeal Swab     Status: None   Collection Time: 05/23/21  4:12 AM   Specimen: Nasopharyngeal Swab; Nasopharyngeal(NP) swabs in vial transport medium  Result Value Ref Range Status   SARS Coronavirus 2 by RT PCR NEGATIVE NEGATIVE Final    Comment: (NOTE) SARS-CoV-2 target nucleic acids are NOT DETECTED.  The SARS-CoV-2 RNA is generally detectable in upper respiratory specimens during the acute phase of infection. The lowest concentration of SARS-CoV-2 viral copies this assay can detect is 138 copies/mL. A negative result does not preclude SARS-Cov-2 infection and should not be used as the sole basis for treatment or other patient management decisions. A negative result may occur with  improper specimen collection/handling, submission of specimen other than nasopharyngeal swab, presence  of viral mutation(s) within the areas targeted by this assay, and inadequate number of viral copies(<138 copies/mL). A negative result must be combined with clinical observations, patient history, and epidemiological information. The expected result is Negative.  Fact Sheet for Patients:  EntrepreneurPulse.com.au  Fact Sheet for Healthcare Providers:  IncredibleEmployment.be  This test is no t yet approved or cleared by the [place] FDA and  has been authorized for detection and/or diagnosis of SARS-CoV-2 by FDA under an Emergency Use Authorization (EUA). This EUA will remain  in effect (meaning this test can be used) for the duration of the COVID-19 declaration under Section 564(b)(1) of the Act, 21 U.S.C.section 360bbb-3(b)(1), unless the authorization is terminated  or revoked sooner.       Influenza A by PCR NEGATIVE NEGATIVE Final   Influenza B by PCR NEGATIVE NEGATIVE Final    Comment: (NOTE) The Xpert Xpress SARS-CoV-2/FLU/RSV plus assay is intended as an aid in the diagnosis  of influenza from Nasopharyngeal swab specimens and should not be used as a sole basis for treatment. Nasal washings and aspirates are unacceptable for Xpert Xpress SARS-CoV-2/FLU/RSV testing.  Fact Sheet for Patients: EntrepreneurPulse.com.au  Fact Sheet for Healthcare Providers: IncredibleEmployment.be  This test is not yet approved or cleared by the [place] FDA and has been authorized for detection and/or diagnosis of SARS-CoV-2 by FDA under an Emergency Use Authorization (EUA). This EUA will remain in effect (meaning this test can be used) for the duration of the COVID-19 declaration under Section 564(b)(1) of the Act, 21 U.S.C. section 360bbb-3(b)(1), unless the authorization is terminated or revoked.  Performed at [facility], [address] [place], [place] [address]   MRSA Next Gen by PCR, Nasal      Status: None   Collection Time: 05/23/21  1:06 PM   Specimen: Nasal Mucosa; Nasal Swab  Result Value Ref Range Status   MRSA by PCR Next Gen NOT DETECTED NOT DETECTED Final    Comment: (NOTE) The GeneXpert MRSA Assay (FDA approved for NASAL specimens only), is one component of a comprehensive MRSA colonization surveillance program. It is not intended to diagnose MRSA infection nor to guide or monitor treatment for MRSA infections. Test performance is not FDA approved in patients less than 21 years old. Performed at [facility], [address], [place], [place] [address]      Time coordinating discharge: 45 minutes  SIGNED:   Tawni Millers, MD  [facility] 05/29/2021, 10:35 AM

## 2021-05-29 NOTE — Progress Notes (Signed)
Patient discharged: Home with family.  Left via: Wheelchair  Discharge paperwork reviewed and given: to patient and family. Teach back completed. IV and telemetry disconnected. Belongings given to patient.  

## 2021-05-29 NOTE — Progress Notes (Signed)
[facility] Progress Note   Assessment/ Plan:     ESRD [facility] MWF  1. NSTEMI: s/p hep gtt, cath 05/28/21 showing low-flow AS 2.  AS- to be worked up as OP for TAVR 3.ESRD: MWF, short HD 05/26/21 had HD at [facility] 05/25/21.  New EDW needs to be established--> 79.5 kg 4. Anemia: Hgb improved 7.0--> 8.1 s/p 1 u pRBCs, now 7.7 this AM.  S/p 1 dose aranesp to get him to goal.   5 CKD-MBD: binders  6. Nutrition: renal diet    Subjective:    Cath yesterday without PCI but low- flow AS- going to be worked up for TAVR.  Dialysis yesterday, went well.     Objective:   BP (!) 135/58    Pulse 64    Temp 97.9 F (36.6 C) (Oral)    Resp 18    Ht 5\' 9"  (1.753 m)    Wt 79.6 kg    SpO2 95%    BMI 25.90 kg/m   Physical Exam: Gen:NAD, sitting upright in chair, NAD CVS:RRR Resp: bilateral crackles nearly resolved Abd: soft Ext: no LE edema ACCESS: RUE AVF + T/B with some prox whistling  Labs: BMET Recent Labs  Lab 05/23/21 0155 05/23/21 0217 05/24/21 0116 05/25/21 0513 05/27/21 0419 05/28/21 0230 05/28/21 1528 05/28/21 1533 05/28/21 1537 05/29/21 0428  NA 132* 136 136 133* 135 133* 138 137 140 134*  K 4.7 4.4 3.5 4.1 3.7 4.4 3.2* 3.3* 3.0* 3.8  CL 96* 95* 96* 96* 94* 94*  --   --   --  94*  CO2 27 29 30 26 28 25   --   --   --  28  GLUCOSE 105* 128* 109* 93 90 94  --   --   --  95  BUN 41* 41* 20 53* 18 35*  --   --   --  29*  CREATININE 4.90* 4.90* 2.95* 4.94* 2.98* 4.73*  --   --   --  4.40*  CALCIUM 8.7* 8.8* 8.9 8.2* 8.8* 9.0  --   --   --  8.9  PHOS 1.5*  --  1.8*  --   --   --   --   --   --   --    CBC Recent Labs  Lab 05/26/21 0344 05/27/21 0402 05/28/21 0230 05/28/21 1528 05/28/21 1533 05/28/21 1537 05/29/21 0428  WBC 5.2 4.3 4.1  --   --   --  3.9*  HGB 8.1* 7.7* 7.9* 8.5* 8.5* 8.2* 8.5*  HCT 24.4* 22.9* 23.9* 25.0* 25.0* 24.0* 25.5*  MCV 94.2 94.2 94.8  --   --   --  93.1  PLT 127* 129* 132*  --   --   --  128*      Medications:      sodium chloride   Intravenous Once   sodium chloride   Intravenous Once   aspirin EC  81 mg Oral Daily   atorvastatin  80 mg Oral Daily   carvedilol  6.25 mg Oral BID   Chlorhexidine Gluconate Cloth  6 each Topical Daily   clopidogrel  75 mg Oral Daily   furosemide  80 mg Oral Daily   levothyroxine  50 mcg Oral QAC breakfast   pantoprazole  40 mg Oral BID   sodium chloride flush  3 mL Intravenous Q12H   sodium chloride flush  3 mL Intravenous Q12H     Madelon Lips, MD 05/29/2021, 11:41 AM

## 2021-05-29 NOTE — Plan of Care (Signed)
  Problem: Education: Goal: Knowledge of General Education information will improve Description Including pain rating scale, medication(s)/side effects and non-pharmacologic comfort measures Outcome: Progressing   Problem: Health Behavior/Discharge Planning: Goal: Ability to manage health-related needs will improve Outcome: Progressing   

## 2021-05-29 NOTE — Progress Notes (Signed)
° °  Palliative Medicine Inpatient Follow Up Note  HPI: 76 y.o. male  with past medical history of ESRD on HD, bladder cancer in 2013 (remission), htn, hld, cholelithiasis, hypothyroidism, GERD, CAD s/p CABG, and CHF admitted on 05/23/2021 with chest pain and heart burn. Diagnosed with NSTEMI - plan for cardiac cath 1/12. Echo also reveals aortic stenosis. Patient with anemia and need for blood transfusion - holding off cath for 1/11 to watch hgb. Patient continues on HD throughout hosptalization. PMT consulted to discuss [place].   Today's Discussion 05/29/2021  *Please note that this is a verbal dictation therefore any spelling or grammatical errors are due to the "[employer] One" system interpretation.  Chart reviewed inclusive of progress notes, laboratory results, and diagnostic images.   Created space and opportunity for patient to explore thoughts feelings and fears regarding current medical situation.  I met with Jeneen Rinks at bedside. He shares that from his perspective the cardiac catheterization went alright in that his "bypasses remain open". He states that there is an "issue with my aorta" which there will be a follow up scan of in the near future.   Jase is willing to complete a MOST for with me today wishes as below:  Cardiopulmonary Resuscitation: Do Not Attempt Resuscitation (DNR/No CPR)  Medical Interventions: Limited Additional Interventions: Use medical treatment, IV fluids and cardiac monitoring as indicated, DO NOT USE intubation or mechanical ventilation. May consider use of less invasive airway support such as BiPAP or CPAP. Also provide comfort measures. Transfer to the hospital if indicated. Avoid intensive care.   Antibiotics: Antibiotics if indicated  IV Fluids: IV fluids if indicated  Feeding Tube: Feeding tube for a defined trial period   [place]ie shares hopes for possible discharge today.  Questions and concerns addressed   Palliative Support Provided.   Objective  Assessment: Vital Signs Vitals:   05/29/21 1001 05/29/21 1156  BP: (!) 135/58 (!) 129/54  Pulse: 64 64  Resp:  18  Temp:  97.8 F (36.6 C)  SpO2:  100%    Intake/Output Summary (Last 24 hours) at 05/29/2021 1231 Last data filed at 05/29/2021 0454 Gross per 24 hour  Intake 1347 ml  Output 2500 ml  Net -1153 ml   Last Weight  Most recent update: 05/29/2021  4:09 AM    Weight  79.6 kg (175 lb 6.4 oz)            Gen:  Elderly M in NAD HEENT: moist mucous membranes CV: Regular rate and rhythm  PULM: On RA ABD: soft/nontender EXT: No edema  Neuro: Alert and oriented x3   SUMMARY OF RECOMMENDATIONS   DNAR/DNI  MOST Completed, paper copy placed onto the chart electric copy can be found in [facility]  DNR Form Completed, paper copy placed onto the chart electric copy can be found in [facility]  Continue current level of care  MDM - High in the setting of advance care planning escalation/de-escalation of care. MOST completion.  ______________________________________________________________________________________ [facility] Team Team Cell Phone: [phone] Please utilize secure chat with additional questions, if there is no response within 30 minutes please call the above phone number  Palliative Medicine Team providers are available by phone from 7am to 7pm daily and can be reached through the team cell phone.  Should this patient require assistance outside of these hours, please call the patient's attending physician.

## 2021-05-31 ENCOUNTER — Other Ambulatory Visit: Payer: Self-pay | Admitting: Physician Assistant

## 2021-05-31 ENCOUNTER — Encounter (HOSPITAL_COMMUNITY): Payer: Self-pay | Admitting: Cardiovascular Disease

## 2021-05-31 ENCOUNTER — Encounter: Payer: Self-pay | Admitting: Physician Assistant

## 2021-05-31 DIAGNOSIS — I350 Nonrheumatic aortic (valve) stenosis: Secondary | ICD-10-CM

## 2021-05-31 NOTE — Progress Notes (Signed)
Late Entry Note:  D/C summary faxed to [facility] for continuation of care.   Melven Sartorius Renal Navigator [phone]

## 2021-06-15 ENCOUNTER — Other Ambulatory Visit: Payer: Self-pay | Admitting: Physician Assistant

## 2021-06-15 ENCOUNTER — Other Ambulatory Visit: Payer: Self-pay

## 2021-06-15 ENCOUNTER — Ambulatory Visit (HOSPITAL_COMMUNITY)
Admission: RE | Admit: 2021-06-15 | Discharge: 2021-06-15 | Disposition: A | Discharge status: Home / Self Care | Payer: No Typology Code available for payment source | Source: Ambulatory Visit | Attending: Physician Assistant | Admitting: Physician Assistant

## 2021-06-15 DIAGNOSIS — I350 Nonrheumatic aortic (valve) stenosis: Secondary | ICD-10-CM

## 2021-06-15 DIAGNOSIS — I729 Aneurysm of unspecified site: Secondary | ICD-10-CM

## 2021-06-15 MED ORDER — IOHEXOL 350 MG/ML SOLN
100.0000 mL | Freq: Once | INTRAVENOUS | Status: AC | PRN
  Administered 2021-06-15: 100 mL via INTRAVENOUS

## 2021-06-21 ENCOUNTER — Encounter: Payer: Self-pay | Admitting: Physician Assistant

## 2021-06-22 ENCOUNTER — Other Ambulatory Visit: Payer: Self-pay

## 2021-06-22 ENCOUNTER — Ambulatory Visit (HOSPITAL_COMMUNITY)
Admission: RE | Admit: 2021-06-22 | Discharge: 2021-06-22 | Disposition: A | Discharge status: Home / Self Care | Payer: No Typology Code available for payment source | Source: Ambulatory Visit | Attending: Cardiovascular Disease | Admitting: Cardiovascular Disease

## 2021-06-22 DIAGNOSIS — I729 Aneurysm of unspecified site: Secondary | ICD-10-CM | POA: Diagnosis not present

## 2021-06-22 DIAGNOSIS — I728 Aneurysm of other specified arteries: Secondary | ICD-10-CM

## 2021-06-25 ENCOUNTER — Ambulatory Visit (INDEPENDENT_AMBULATORY_CARE_PROVIDER_SITE_OTHER): Payer: No Typology Code available for payment source | Admitting: Cardiovascular Disease

## 2021-06-25 ENCOUNTER — Encounter: Payer: Self-pay | Admitting: Cardiovascular Disease

## 2021-06-25 ENCOUNTER — Other Ambulatory Visit: Payer: Self-pay

## 2021-06-25 VITALS — BP 114/60 | HR 58 | Ht 69.0 in | Wt 179.8 lb

## 2021-06-25 DIAGNOSIS — I729 Aneurysm of unspecified site: Secondary | ICD-10-CM

## 2021-06-25 DIAGNOSIS — I350 Nonrheumatic aortic (valve) stenosis: Secondary | ICD-10-CM

## 2021-06-25 NOTE — Progress Notes (Signed)
Pre Surgical Assessment: 5 M Walk Test  54M=16.26ft  5 Meter Walk Test- trial 1: 3.82 seconds 5 Meter Walk Test- trial 2: 5.12 seconds 5 Meter Walk Test- trial 3: 4.10 seconds 5 Meter Walk Test Average: 4.34 seconds

## 2021-06-25 NOTE — Patient Instructions (Signed)
Medication Instructions:  No changes *If you need a refill on your cardiac medications before your next appointment, please call your pharmacy*   Lab Work: none  Testing/Procedures: none   Follow-Up: Per Structural Heart Valve Team

## 2021-06-25 NOTE — Progress Notes (Signed)
[facility] Consult Note  Chief Complaint  Patient presents with   New Patient (Initial Visit)    Severe aortic stenosis   History of Present Illness: 76 yo male with history of PAD, CAD s/p CABG in 2008, anemia, anxiety, arthritis, bladder cancer, BPH, former tobacco abuse, COPD, ESRD on HD, GERD, hyperlipidemia, HTN, hypothyroidism, sleep apnea, prior stroke and severe aortic stenosis who is here today as a new consult, referred by Dr. Harl Bowie, for further discussion regarding his aortic stenosis and possible TAVR. He was admitted to [facility]  January 2023 with a NSTEMI. Echo 05/24/21 with LVEF=45%, mild to moderate MR. Severe aortic low flow/low gradient aortic stenosis with mean gradient 24 mmHg, peak gradient 41 mmHg, AVA 0.9 cm2, DI 0.24, SVI 38. He had CABG in 2008 and redo CABG in 2018. Cardiac cath 05/28/21 with severe three vessel CAD. Patent free RIMA graft to the Diagonal, patent right radial artery graft to the posterolateral artery, patent LIMA to LAD. Moderate ostial Circumflex stenosis. The Circumflex is not grafted. No culprit lesion noted. CT scans with no good options for access from the femoral arteries, carotid arteries or subclavian arteries. 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 Gated cardiac CT with anatomy suitable for a 29 mm Edwards Sapien 3 valve.   He tells me today that he has been having progressive dyspnea on exertion. He has not had chest pain since leaving the hospital three weeks ago. He is active around the house but limited somewhat by knee pain. No dizziness, near syncope or LE edema. He lives in [place] with his wife. He is retired from [employer], educational. He has full dentures.   Primary Care Physician: [facility] Primary Cardiologist: Harl Bowie Referring Cardiologist: Harl Bowie  Past Medical History:  Diagnosis Date   Acute myocardial infarction of other inferior wall, initial episode of care    Anemia    Anxiety    Arthritis     Cancer ([place])    bladder 2013 removed, has cystoscopy 10/2016, has returned x 2   COPD (chronic obstructive pulmonary disease) ([place])    Coronary artery disease    Artery bypass graft Jan 2008   Dialysis patient [facility])    GERD (gastroesophageal reflux disease)    History of hiatal hernia    Hyperlipidemia    Hypertension    Hypothyroidism    Severe aortic stenosis    Sleep apnea    Stroke ([place])    Tobacco user     Past Surgical History:  Procedure Laterality Date   APPENDECTOMY     AV FISTULA PLACEMENT Right 02/11/2020   Procedure: RIGHT ARM BRACHIOCEPHALIC ARTERIOVENOUS (AV) FISTULA;  Surgeon: Angelia Mould, MD;  Location: [facility];  Service: Vascular;  Laterality: Right;   CARDIAC CATHETERIZATION     COLONOSCOPY WITH PROPOFOL N/A 09/02/2020   Procedure: COLONOSCOPY WITH PROPOFOL;  Surgeon: Rogene Houston, MD;  [facility];  Service: Endoscopy;  Laterality: N/A;   CORONARY ARTERY BYPASS GRAFT  06/14/2006   x 5   CORONARY ARTERY BYPASS GRAFT N/A 09/19/2016   Procedure: REDO CORONARY ARTERY BYPASS GRAFTING (CABG) x two, using right internal mammary artery and left radial artery;  Surgeon: [name], MD;  Location: [place];  Service: Open Heart Surgery;  Laterality: N/A;   FOOT SURGERY     HEMOSTASIS CLIP PLACEMENT  09/02/2020   Procedure: HEMOSTASIS CLIP PLACEMENT;  Surgeon: Rogene Houston, MD;  [facility];  Service: Endoscopy;;  HERNIA REPAIR     HOT HEMOSTASIS  09/02/2020   Procedure: HOT HEMOSTASIS (ARGON PLASMA COAGULATION/BICAP);  Surgeon: Rogene Houston, MD;  [facility];  Service: Endoscopy;;   LEFT HEART CATH AND CORS/GRAFTS ANGIOGRAPHY N/A 09/14/2016   Procedure: Left Heart Cath and Cors/Grafts Angiography;  Surgeon: Troy Sine, MD;  Location: [place] [facility];  Service: Cardiovascular;  Laterality: N/A;   LEFT HEART CATHETERIZATION WITH CORONARY/GRAFT ANGIOGRAM N/A 01/31/2014   Procedure: LEFT HEART CATHETERIZATION WITH  Beatrix Fetters;  Surgeon: Sanda Klein, MD;  Location: [place] CATH LAB;  Service: Cardiovascular;  Laterality: N/A;   NOSE SURGERY     PERCUTANEOUS CORONARY STENT INTERVENTION (PCI-S)  01/31/2014   Procedure: PERCUTANEOUS CORONARY STENT INTERVENTION (PCI-S);  Surgeon: Sanda Klein, MD;  Location: [facility] CATH LAB;  Service: Cardiovascular;;   POLYPECTOMY  09/02/2020   Procedure: POLYPECTOMY;  Surgeon: Rogene Houston, MD;  [facility];  Service: Endoscopy;;   RADIAL ARTERY HARVEST Left 09/19/2016   Procedure: RADIAL ARTERY HARVEST;  Surgeon: [name], MD;  Location: [place];  Service: Open Heart Surgery;  Laterality: Left;   RIGHT/LEFT HEART CATH AND CORONARY/GRAFT ANGIOGRAPHY N/A 05/28/2021   Procedure: RIGHT/LEFT HEART CATH AND CORONARY/GRAFT ANGIOGRAPHY;  Surgeon: Burnell Blanks, MD;  Location: [place] CV LAB;  Service: Cardiovascular;  Laterality: N/A;   TEE WITHOUT CARDIOVERSION N/A 09/19/2016   Procedure: TRANSESOPHAGEAL ECHOCARDIOGRAM (TEE);  Surgeon: [name], MD;  Location: [place];  Service: Open Heart Surgery;  Laterality: N/A;    Current Outpatient Medications  Medication Sig Dispense Refill   acetaminophen (TYLENOL) 325 MG tablet Take 2 tablets (650 mg total) by mouth every 6 (six) hours as needed for mild pain.     allopurinol (ZYLOPRIM) 100 MG tablet Take 100 mg by mouth daily.      aspirin EC 81 MG EC tablet Take 1 tablet (81 mg total) by mouth daily. Swallow whole. 30 tablet 0   atorvastatin (LIPITOR) 80 MG tablet Take 80 mg by mouth daily.     carvedilol (COREG) 6.25 MG tablet Take 1 tablet (6.25 mg total) by mouth 2 (two) times daily. 60 tablet 0   clopidogrel (PLAVIX) 75 MG tablet Take 75 mg by mouth daily.     finasteride (PROSCAR) 5 MG tablet Take 5 mg by mouth daily.     folic acid (FOLVITE) 1 MG tablet Take 1 mg by mouth daily.     furosemide (LASIX) 80 MG tablet Take 1 tablet (80 mg total) by mouth every Tuesday, Thursday, Saturday,  and Sunday.     isosorbide mononitrate (IMDUR) 120 MG 24 hr tablet Take 1 tablet (120 mg total) by mouth daily. 30 tablet 5   levothyroxine (SYNTHROID, LEVOTHROID) 50 MCG tablet Take 50 mcg by mouth daily before breakfast.     nitroGLYCERIN (NITROSTAT) 0.4 MG SL tablet Place 0.4 mg under the tongue every 5 (five) minutes as needed for chest pain (max 3 doses).     Omega-3 Fatty Acids (RA FISH OIL) 1000 MG CAPS Take 2,000 mg by mouth 2 (two) times daily.     omeprazole (PRILOSEC) 40 MG capsule Take 20 mg by mouth daily.     tamsulosin (FLOMAX) 0.4 MG CAPS capsule Take 0.4 mg by mouth daily.     No current facility-administered medications for this visit.    Allergies  Allergen Reactions   Cardizem Cd [Diltiazem Hcl Er Beads] Palpitations    "Makes heart skip"  Social History   Socioeconomic History   Marital status: Married    Spouse name: Not on file   Number of children: 2   Years of education: Not on file   Highest education level: Not on file  Occupational History   Occupation: Retired-Worked in Psychologist, educational  Tobacco Use   Smoking status: Former    Packs/day: 1.00    Years: 40.00    Pack years: 40.00    Types: Cigarettes    Start date: 02/20/1959    Quit date: 09/13/2016    Years since quitting: 4.7   Smokeless tobacco: Never  Vaping Use   Vaping Use: Never used  Substance and Sexual Activity   Alcohol use: No    Alcohol/week: 0.0 standard drinks    Comment: "No, not really"   Drug use: No   Sexual activity: Not Currently  Other Topics Concern   Not on file  Social History Narrative   Not on file   Social Determinants of Health   Financial Resource Strain: Not on file  Food Insecurity: Not on file  Transportation Needs: Not on file  Physical Activity: Not on file  Stress: Not on file  Social Connections: Not on file  Intimate Partner Violence: Not on file    Family History  Problem Relation Age of Onset   Heart attack Mother    CVA Mother    CVA  Father    Heart attack Brother    Colon cancer Neg Hx    Colon polyps Neg Hx     Review of Systems:  As stated in the HPI and otherwise negative.   BP 114/60    Pulse (!) 58    Ht 5\' 9"  (1.753 m)    Wt 179 lb 12.8 oz (81.6 kg)    SpO2 98%    BMI 26.55 kg/m   Physical Examination: General: Well developed, well nourished, NAD  HEENT: OP clear, mucus membranes moist  SKIN: warm, dry. No rashes. Neuro: No focal deficits  Musculoskeletal: Muscle strength 5/5 all ext  Psychiatric: Mood and affect normal  Neck: No JVD, no carotid bruits, no thyromegaly, no lymphadenopathy.  Lungs:Clear bilaterally, no wheezes, rhonci, crackles Cardiovascular: Regular rate and rhythm. Loud, harsh, late peaking systolic murmur.  Abdomen:Soft. Bowel sounds present. Non-tender.  Extremities: No lower extremity edema. Pulses are 2 + in the bilateral DP/PT.  EKG:  EKG is not ordered today.  Echo 05/24/21: 1. Inferolateral hypokinesis.. Left ventricular ejection fraction, by  estimation, is 45%%. The left ventricular internal cavity size was  moderately dilated. There is mild left ventricular hypertrophy. Left  ventricular diastolic parameters are consistent   with Grade II diastolic dysfunction (pseudonormalization). Elevated left  atrial pressure.   2. Right ventricular systolic function is normal. The right ventricular  size is normal. There is moderately elevated pulmonary artery systolic  pressure.   3. Left atrial size was moderately dilated.   4. Right atrial size was mildly dilated.   5. Mild to moderate mitral valve regurgitation.   6. AV is thickened, calcified with restricted motion. Peak and mean  gradients through the valve are 41 and 24 mm Hg respectively. AVA (VTI) is  0.92 cm2 Dimensionless index is 0.24 consistent with severe AS. Note SVI  is 38, lower end of normal.. Aortic  valve regurgitation is mild to moderate.   7. The inferior vena cava is normal in size with greater than 50%   respiratory variability, suggesting right atrial pressure of 3 mmHg.  FINDINGS   Left Ventricle: Inferolateral hypokinesis. Left ventricular ejection  fraction, by estimation, is 45%%. The left ventricular internal cavity  size was moderately dilated. There is mild left ventricular hypertrophy.  Left ventricular diastolic parameters  are consistent with Grade II diastolic dysfunction (pseudonormalization).  Elevated left atrial pressure.   Right Ventricle: The right ventricular size is normal. Right vetricular  wall thickness was not assessed. Right ventricular systolic function is  normal. There is moderately elevated pulmonary artery systolic pressure.  The tricuspid regurgitant velocity is   3.38 m/s, and with an assumed right atrial pressure of 3 mmHg, the  estimated right ventricular systolic pressure is 96.2 mmHg.   Left Atrium: Left atrial size was moderately dilated.   Right Atrium: Right atrial size was mildly dilated.   Pericardium: There is no evidence of pericardial effusion.   Mitral Valve: There is mild thickening of the mitral valve leaflet(s).  Mild to moderate mitral annular calcification. Mild to moderate mitral  valve regurgitation.   Tricuspid Valve: The tricuspid valve is normal in structure. Tricuspid  valve regurgitation is mild.   Aortic Valve: AV is thickened, calcified with restricted motion. Peak and  mean gradients through the valve are 41 and 24 mm Hg respectively. AVA  (VTI) is 0.92 cm2 Dimensionless index is 0.24 consistent with severe AS.  Note SVI is 38, lower end of normal.   Aortic valve regurgitation is mild to moderate. Aortic regurgitation PHT  measures 230 msec. Aortic valve mean gradient measures 23.5 mmHg. Aortic  valve peak gradient measures 41.9 mmHg. Aortic valve area, by VTI measures  0.90 cm.   Pulmonic Valve: The pulmonic valve was normal in structure. Pulmonic valve  regurgitation is mild.   Aorta: The aortic root and  ascending aorta are structurally normal, with  no evidence of dilitation.   Venous: The inferior vena cava is normal in size with greater than 50%  respiratory variability, suggesting right atrial pressure of 3 mmHg.   IAS/Shunts: No atrial level shunt detected by color flow Doppler.      LEFT VENTRICLE  PLAX 2D  LVIDd:         5.70 cm      Diastology  LVIDs:         4.20 cm      LV e' medial:    3.15 cm/s  LV PW:         1.20 cm      LV E/e' medial:  35.6  LV IVS:        1.10 cm      LV e' lateral:   5.66 cm/s  LVOT diam:     2.20 cm      LV E/e' lateral: 19.8  LV SV:         75  LV SV Index:   38  LVOT Area:     3.80 cm                                 3D Volume EF:  LV Volumes (MOD)            3D EF:        44 %  LV vol d, MOD A2C: 189.0 ml LV EDV:       252 ml  LV vol d, MOD A4C: 197.0 ml LV ESV:       141 ml  LV vol s, MOD A2C: 117.0  ml LV SV:        111 ml  LV vol s, MOD A4C: 120.0 ml  LV SV MOD A2C:     72.0 ml  LV SV MOD A4C:     197.0 ml  LV SV MOD BP:      73.4 ml   RIGHT VENTRICLE            IVC  RV Basal diam:  3.90 cm    IVC diam: 1.60 cm  RV S prime:     7.95 cm/s  TAPSE (M-mode): 2.6 cm   LEFT ATRIUM            Index        RIGHT ATRIUM           Index  LA diam:      5.10 cm  2.57 cm/m   RA Area:     20.50 cm  LA Vol (A2C): 108.0 ml 54.43 ml/m  RA Volume:   63.90 ml  32.20 ml/m   AORTIC VALVE  AV Area (Vmax):    1.00 cm  AV Area (Vmean):   0.96 cm  AV Area (VTI):     0.90 cm  AV Vmax:           323.50 cm/s  AV Vmean:          232.000 cm/s  AV VTI:            0.835 m  AV Peak Grad:      41.9 mmHg  AV Mean Grad:      23.5 mmHg  LVOT Vmax:         85.30 cm/s  LVOT Vmean:        58.300 cm/s  LVOT VTI:          0.198 m  LVOT/AV VTI ratio: 0.24  AI PHT:            230 msec     AORTA  Ao Root diam: 3.50 cm  Ao Asc diam:  3.20 cm   MITRAL VALVE                TRICUSPID VALVE  MV Area (PHT): 4.49 cm     TR Peak grad:   45.7 mmHg  MV Decel Time:  169 msec     TR Vmax:        338.00 cm/s  MV E velocity: 112.00 cm/s  MV A velocity: 84.80 cm/s   SHUNTS  MV E/A ratio:  1.32         Systemic VTI:  0.20 m                              Systemic Diam: 2.20 cm   Cardiac cath 05/28/21:   Prox LAD to Mid LAD lesion is 100% stenosed.   Mid LM to Dist LM lesion is 50% stenosed.   Ost Cx to Prox Cx lesion is 50% stenosed.   Prox RCA lesion is 99% stenosed.   RPAV lesion is 80% stenosed.   Mid LAD to Dist LAD lesion is 30% stenosed.   Mid RCA lesion is 50% stenosed.   RIMA graft was visualized by angiography and is normal in caliber.   LIMA graft was visualized by angiography and is normal in caliber.   Left radial artery graft was visualized by angiography and is normal in caliber.   Severe three vessel CAD s/p CABG  and redo CABG. The most recent CABG in 2018 with Free RIMA graft to Diagonal and right radial artery to posterolateral artery, both patent. The LIMA to the LAD remains patent. I did not engage the old vein grafts which are known to be occluded.  The distal left main has a moderate stenosis The LAD is occluded in the mid segment. Patent LIMA to mid LAD. The free RIMA graft to the Diagonal is patent The Circumflex is not grafted. The ostial Circumflex has moderate stenosis The RCA is a large dominant artery. The proximal RCA has a severe stenosis. Competitive graft flow seen in the mid and distal RCA. The right radial artery graft to the posterolateral artery is patent. This fills the PDA, posterolateral artery and the distal RCA.  Aortic stenosis, likely severe low flow/low gradient by echo. Cath data: mean gradient 11 mmHg, peak to peak gradient 14 mmHg)   He has no obvious culprit lesions for NSTEMI. Continue medical management of CAD. He likely has severe low flow/low gradient aortic stenosis. We will consider him a candidate for TAVR. If stable, he could be discharged home over the weekend and we could plan pre TAVR CT scans as an  outpatient. If he remains inpatient, we could get his CT scans next week. We will follow up Monday and make further plans.    Indications  Non-ST elevation (NSTEMI) myocardial infarction (HCC) [I21.4 (ICD-10-CM)]  Nonrheumatic aortic valve stenosis [I35.0 (ICD-10-CM)]   Procedural Details  Technical Details Indication: 75 yo male with history of ESRD on HD, CAD s/p CABG in 2008 and redo CABG in 2018 admitted with a NSTEMI. Echo with reduced LV systolic function with severe low flow/low gradient aortic stenosis.   Procedure: The risks, benefits, complications, treatment options, and expected outcomes were discussed with the patient. The patient and/or family concurred with the proposed plan, giving informed consent. The patient was sedated with Versed and Fentanyl. The right groin was prepped and draped in the usual manner. Using the modified Seldinger access technique, a 5 French sheath was placed in the right femoral artery and a 7 French sheath was placed in the right femoral vein using u/s guidance. Standard diagnostic catheters were used to perform selective coronary angiography. I engaged the native RCA and both of the vein grafts arising from the ascending aorta with the JR4 catheter. The LIMA graft was non-selectively visualized with an IMA catheter. The aortic valve was crossed with the JR4 catheter and the J wire.   There were no immediate complications. The patient was taken to the recovery area in stable condition.     Estimated blood loss <50 mL.   During this procedure medications were administered to achieve and maintain moderate conscious sedation while the patient's heart rate, blood pressure, and oxygen saturation were continuously monitored and I was present face-to-face 100% of this time.   Medications (Filter: Administrations occurring from 1446 to 1603 on 05/28/21)  important  Continuous medications are totaled by the amount administered until 05/28/21 1603.   Heparin  (Porcine) in NaCl 1000-0.9 UT/500ML-% SOLN (mL) Total volume:  1,500 mL Date/Time Rate/Dose/Volume Action   05/28/21 1451 500 mL Given   1451 500 mL Given   1509 500 mL Given    lidocaine (PF) (XYLOCAINE) 1 % injection (mL) Total volume:  10 mL Date/Time Rate/Dose/Volume Action   05/28/21 1517 10 mL Given    fentaNYL (SUBLIMAZE) injection (mcg) Total dose:  25 mcg Date/Time Rate/Dose/Volume Action   05/28/21 1517 12.5 mcg  Given   1520 12.5 mcg Given    midazolam (VERSED) injection (mg) Total dose:  1 mg Date/Time Rate/Dose/Volume Action   05/28/21 1517 0.5 mg Given   1520 0.5 mg Given    iohexol (OMNIPAQUE) 350 MG/ML injection (mL) Total volume:  105 mL Date/Time Rate/Dose/Volume Action   05/28/21 1558 105 mL Given    0.9 %  sodium chloride infusion (Manually program via Guardrails IV Fluids) Total dose:  Cannot be calculated* Dosing weight:  83 *Administration dose not documented Date/Time Rate/Dose/Volume Action   05/28/21 1446 *Not included in total MAR Hold    0.9 %  sodium chloride infusion (Manually program via Guardrails IV Fluids) Total dose:  Cannot be calculated* Dosing weight:  81.7 *Administration dose not documented Date/Time Rate/Dose/Volume Action   05/28/21 1446 *Not included in total MAR Hold    0.9 %  sodium chloride infusion (mL) Total dose:  Cannot be calculated* Dosing weight:  83.5 *Administration dose not documented Date/Time Rate/Dose/Volume Action   05/28/21 1446 *Not included in total MAR Hold    0.9 %  sodium chloride infusion (mL) Total dose:  Cannot be calculated* Dosing weight:  83.5 *Administration dose not documented Date/Time Rate/Dose/Volume Action   05/28/21 1446 *Not included in total MAR Hold    0.9 %  sodium chloride infusion (mL) Total dose:  Cannot be calculated* Dosing weight:  83.5 *Administration dose not documented Date/Time Rate/Dose/Volume Action   05/28/21 1446 *Not included in total MAR Hold    aspirin EC  tablet 81 mg (mg) Total dose:  Cannot be calculated* Dosing weight:  82.6 *Administration dose not documented Date/Time Rate/Dose/Volume Action   05/28/21 1446 *Not included in total MAR Hold    atorvastatin (LIPITOR) tablet 80 mg (mg) Total dose:  Cannot be calculated* Dosing weight:  83.5 *Administration dose not documented Date/Time Rate/Dose/Volume Action   05/28/21 1446 *Not included in total MAR Hold    carvedilol (COREG) tablet 6.25 mg (mg) Total dose:  Cannot be calculated* Dosing weight:  83.5 *Administration dose not documented Date/Time Rate/Dose/Volume Action   05/28/21 1446 *Not included in total MAR Hold    Chlorhexidine Gluconate Cloth 2 % PADS 6 each (each) Total dose:  Cannot be calculated* Dosing weight:  82.5 *Administration dose not documented Date/Time Rate/Dose/Volume Action   05/28/21 1446 *Not included in total MAR Hold    clopidogrel (PLAVIX) tablet 75 mg (mg) Total dose:  Cannot be calculated* Dosing weight:  83.5 *Administration dose not documented Date/Time Rate/Dose/Volume Action   05/28/21 1446 *Not included in total MAR Hold    HYDROmorphone (DILAUDID) injection 0.5-1 mg (mg) Total dose:  Cannot be calculated* Dosing weight:  83.5 *Administration dose not documented Date/Time Rate/Dose/Volume Action   05/28/21 1446 *Not included in total MAR Hold    levothyroxine (SYNTHROID) tablet 50 mcg (mcg) Total dose:  Cannot be calculated* Dosing weight:  83.5 *Administration dose not documented Date/Time Rate/Dose/Volume Action   05/28/21 1446 *Not included in total MAR Hold    lidocaine (PF) (XYLOCAINE) 1 % injection 5 mL (mL) Total dose:  Cannot be calculated* Dosing weight:  83.5 *Administration dose not documented Date/Time Rate/Dose/Volume Action   05/28/21 1446 *Not included in total MAR Hold    lidocaine-prilocaine (EMLA) cream 1 application (application) Total dose:  Cannot be calculated* Dosing weight:  83.5 *Administration dose not  documented Date/Time Rate/Dose/Volume Action   05/28/21 1446 *Not included in total MAR Hold    melatonin tablet 10 mg (mg) Total dose:  Cannot be calculated*  Dosing weight:  80.4 *Administration dose not documented Date/Time Rate/Dose/Volume Action   05/28/21 1446 *Not included in total MAR Hold    nitroGLYCERIN (NITROSTAT) SL tablet 0.4 mg (mg) Total dose:  Cannot be calculated* Dosing weight:  83.5 *Administration dose not documented Date/Time Rate/Dose/Volume Action   05/28/21 1446 *Not included in total MAR Hold    pantoprazole (PROTONIX) EC tablet 40 mg (mg) Total dose:  Cannot be calculated* Dosing weight:  83 *Administration dose not documented Date/Time Rate/Dose/Volume Action   05/28/21 1446 *Not included in total MAR Hold    pentafluoroprop-tetrafluoroeth (GEBAUERS) aerosol 1 application (application) Total dose:  Cannot be calculated* Dosing weight:  83.5 *Administration dose not documented Date/Time Rate/Dose/Volume Action   05/28/21 1446 *Not included in total MAR Hold    sodium chloride flush (NS) 0.9 % injection 3 mL (mL) Total dose:  Cannot be calculated* Dosing weight:  83 *Administration dose not documented Date/Time Rate/Dose/Volume Action   05/28/21 1446 *Not included in total MAR Hold    Sedation Time  Sedation Time Physician-1: 37 minutes 54 seconds Contrast  Medication Name Total Dose  iohexol (OMNIPAQUE) 350 MG/ML injection 105 mL   Radiation/Fluoro  Fluoro time: 15.5 (min) DAP: 50266 (mGycm2) Cumulative Air Kerma: 914 (mGy) Complications  Complications documented before study signed (05/28/2021  7:82 PM)   No complications were associated with this study.  Documented by Gilford Rile, RT - 05/28/2021  4:00 PM     Coronary Findings  Diagnostic Dominance: Right Left Main  Mid LM to Dist LM lesion is 50% stenosed.    Left Anterior Descending  Vessel is large.  Prox LAD to Mid LAD lesion is 100% stenosed. The lesion is  chronically occluded.  Mid LAD to Dist LAD lesion is 30% stenosed.    Left Circumflex  Vessel is large.  Ost Cx to Prox Cx lesion is 50% stenosed.    Right Coronary Artery  Vessel is large.  Prox RCA lesion is 99% stenosed.  Mid RCA lesion is 50% stenosed.    Right Posterior Atrioventricular Artery  RPAV lesion is 80% stenosed.    RIMA Graft To 1st Diag  RIMA graft was visualized by angiography and is normal in caliber. Free RIMA graft to Diagonal    LIMA LIMA Graft To Dist LAD  LIMA graft was visualized by angiography and is normal in caliber.    Left Radial Artery Graft To RPAV  Left radial artery graft was visualized by angiography and is normal in caliber.    Intervention   No interventions have been documented.   Coronary Diagrams  Diagnostic Dominance: Right Intervention  Implants     No implant documentation for this case.   [employer]ng[employer] Images   Show images for CARDIAC CATHETERIZATION Images on Long Term Storage   Show images for Tavius, Turgeon to Procedure Log  Procedure Log    Hemo Data  Flowsheet Row Most Recent Value  Fick Cardiac Output 12.25 L/min  Fick Cardiac Output Index 6.19 (L/min)/BSA  Aortic Mean Gradient 11.27 mmHg  Aortic Peak Gradient 14 mmHg  Aortic Valve Area >3.50  Aortic Value Area Index 1.77 cm2/BSA  RA A Wave 2 mmHg  RA V Wave 3 mmHg  RA Mean 1 mmHg  RV Systolic Pressure 52 mmHg  RV Diastolic Pressure -3 mmHg  RV EDP 3 mmHg  PA Systolic Pressure 55 mmHg  PA Diastolic Pressure 20 mmHg  PA Mean 34 mmHg  PW A Wave 21 mmHg  PW V  Wave 27 mmHg  PW Mean 19 mmHg  AO Systolic Pressure 712 mmHg  AO Diastolic Pressure 35 mmHg  AO Mean 72 mmHg  LV Systolic Pressure 458 mmHg  LV Diastolic Pressure 10 mmHg  LV EDP 28 mmHg  AOp Systolic Pressure 099 mmHg  AOp Diastolic Pressure 36 mmHg  AOp Mean Pressure 69 mmHg  LVp Systolic Pressure 833 mmHg  LVp Diastolic Pressure 13 mmHg  LVp EDP Pressure 28 mmHg  QP/QS 1  TPVR Index  5.49 HRUI  TSVR Index 11.63 HRUI  PVR SVR Ratio 0.21  TPVR/TSVR Ratio 0.47   EXAM: CT ANGIOGRAPHY CHEST, ABDOMEN AND PELVIS   TECHNIQUE: Multidetector CT imaging through the chest, abdomen and pelvis was performed using the standard protocol during bolus administration of intravenous contrast. Multiplanar reconstructed images and MIPs were obtained and reviewed to evaluate the vascular anatomy.   RADIATION DOSE REDUCTION: This exam was performed according to the departmental dose-optimization program which includes automated exposure control, adjustment of the mA and/or kV according to patient size and/or use of iterative reconstruction technique.   CONTRAST:  159mL OMNIPAQUE IOHEXOL 350 MG/ML SOLN   COMPARISON:  CT abdomen and pelvis dated April 05, 2021   FINDINGS: CTA CHEST FINDINGS   Cardiovascular: Mild cardiomegaly. No significant pericardial effusion/thickening. 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 Severe calcified and noncalcified plaque of the thoracic aorta great vessels are normal in course and caliber. no central pulmonary emboli.   Mediastinum/Nodes: No discrete thyroid nodules. Unremarkable esophagus. No pathologically enlarged axillary, mediastinal or hilar lymph nodes.   Lungs/Pleura: No pneumothorax. Small right pleural effusion. Mild centrilobular emphysema. Mild interlobular septal thickening primarily seen at the lung apices and mild ground-glass opacity of the left upper lobe. Small solid clustered nodules of the left lingula. Largest is located on series 5, image 57 and measures 5 mm in mean diameter.   Musculoskeletal: No aggressive appearing focal osseous lesions. Moderate degenerative changes at T6-C7 with associated endplate changes. Prior median sternotomy with intact sternal wires   CTA ABDOMEN AND PELVIS FINDINGS   Hepatobiliary: Normal liver with no liver mass. Gallbladder wall irregularity with adjacent  inflammatory changes and gallstones. No biliary ductal dilatation.   Pancreas: Normal, with no mass or duct dilation.   Spleen: Normal size. No mass.   Adrenals/Urinary Tract: Unchanged size of bilateral adrenal nodules which demonstrated low attenuation on prior noncontrast exam, likely benign adenomas. No hydronephrosis. Renal vascular calcifications with no definite nephrolithiasis. Unchanged indeterminate left adrenal lesion measuring 1.3 cm on series 4, image 129. Additional bilateral renal lesions are compatible with simple cysts and unchanged compared to prior. Bladder is decompressed.   Stomach/Bowel: Normal non-distended stomach. Normal caliber small bowel with no small bowel wall thickening. Appendix is not visualized. Diverticulosis primarily of the sigmoid colon. Normal large bowel with no diverticulosis, large bowel wall thickening or pericolonic fat stranding.   Vascular/Lymphatic: Fluid collection of the right groin measuring 3.9 x 3.0 cm with rounded central area of contrast enhancement that measures up to 1.3 cm with neck leading to the right common femoral artery. Severe atherosclerotic disease consisting of calcified and noncalcified plaque of the abdominal aorta. Major branch vessels are patent with mild to moderate atherosclerotic disease. 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 No pathologically enlarged lymph nodes in  the abdomen or pelvis.   Reproductive: Mild prostatomegaly.   Other: No pneumoperitoneum, ascites or focal fluid collection.   Musculoskeletal: No aggressive appearing focal osseous lesions.   VASCULAR MEASUREMENTS PERTINENT TO TAVR:   AORTA:   Minimal Aortic Diameter -  8.6 mm   Severity of Aortic Calcification-severe    RIGHT PELVIS:   Right Common Iliac Artery -   Minimal Diameter-4.3 mm   Tortuosity-none   Calcification-severe   Right External Iliac Artery -   Minimal Diameter-3.1 mm   Tortuosity-none   Calcification-severe   Right Common Femoral Artery -   Minimal Diameter-2.2 mm   Tortuosity-none   Calcification-severe   LEFT PELVIS:   Left Common Iliac Artery -   Minimal Diameter-3.7 mm   Tortuosity-none   Calcification-severe   Left External Iliac Artery -   Minimal Diameter-4.0 mm   Tortuosity-mild   Calcification-severe   Left Common Femoral Artery -   Minimal Diameter- occluded   Tortuosity-none   Calcification-severe   Review of the MIP images confirms the above findings.   IMPRESSION: Vascular:   1. Vascular findings and measurements pertinent to potential TAVR procedure, as detailed above. 2. Severe thickening calcification of the aortic valve, compatible with reported clinical history of severe aortic stenosis. 3. 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 4. 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 5. 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   Nonvascular:   1. Gallbladder wall irregularity with adjacent inflammatory changes and gallstones, concerning for chronic cholecystitis since findings were present but have worsened have worsened when compared with prior exam dated April 05, 2021. 2. Unchanged size of indeterminate left adrenal lesion measuring 1.3 cm. Further evaluation with dedicated abdominal MRI should be considered. 3. Interlobular septal thickening and mild ground-glass opacity of the left upper lobe, likely due to pulmonary edema. 4. Small right pleural effusion. 5. Small clustered nodules of the  left lingula, likely infectious or inflammatory. Largest measures 5 mm. No follow-up needed if patient is low-risk (and has no known or suspected primary neoplasm). Non-contrast chest CT can be considered in 12 months if patient is high-risk. This recommendation follows the consensus statement: Guidelines for Management of Incidental Pulmonary Nodules Detected on CT Images: From the Fleischner Society 2017; Radiology 2017; 284:228-243.   EXAM: Cardiac TAVR CT   TECHNIQUE: The patient was scanned on a Siemens Force 297 slice scanner. A 120 kV retrospective scan was triggered in the descending thoracic aorta at 111 HU's. Gantry rotation speed was 270 msecs and collimation was .9 mm. No beta blockade or nitro were given. The 3D data set was reconstructed in 5% intervals of the R-R cycle. Systolic and diastolic phases were analyzed on a dedicated work station using MPR, MIP and VRT modes. The patient received 100 cc of contrast.   FINDINGS: Aortic Valve: Severely thickened aortic valve with heavy calcification and moderately reduced excursion the planimeter valve area is 1.32 sq cm consistent with moderate to severe aortic stenosis   Number of leaflets: 3   LVOT calcification: Mild- there is a single, adherent, non-protruding focus of calcification   Annular calcification: Mild- there is a single, adherent, non-protruding focus of calcification   Aortic Valve Calcium Score: 1933   Presence of basal septal hypertrophy: yes   - Diastolic annulus measurements: Area 5.2 cm2, Perimeter 88; despite  hypertrophy, annulus is large in systole than diastole (see below).   Perimembranous septal diameter: 12 mm   Aortic Annulus Measurements- 25% phase assessed   Major annulus diameter: 32 mm   Minor annulus diameter: 26 mm   Annular perimeter: 89 mm   Annular area: 5.83 cm2   Aortic Root Measurements   Sinotubular Junction: 31 mm   Ascending Thoracic Aorta: 33 mm   Aortic  Arch: 28 mm   Descending Thoracic Aorta: 28 mm   Sinus of Valsalva Measurements:   Right coronary cusp width: 30 mm   Left coronary cusp width: 34 mm   Non coronary cusp width: 34 mm   Mean diameter: 32 mm   Coronary Artery Height above Annulus:   Left Main: 17 mm   Left SoV height: 21 mm   Right Coronary: 21 mm   Right SoV height: 15 mm   Optimum Fluoroscopic Angle for Delivery: LAO 7 CAU 9   Valve Considerations:   29 mm Edwards Sapien preferred   34 mm Evolut lower limit annular diameter, there is mild sinus asymmetry but with sufficient coronary height, Sinus of Valsalva Diameter and Height   Coronary Arteries:   S/p CABG with evidence of prior RIMA and LIMA, both which appear largely patent, thought there is some degree of calcification at the LIMA insertion site.   Native arteries are significantly calcified with scan not optimized to address patency.   IMPRESSION: 1. Moderate to Severe Aortic Stenosis. Findings pertinent to TAVR procedure are detailed above.   Mahesh  Chandrasekhar     Electronically Signed   By: Rudean Haskell M.D.   On: 06/15/2021 13:41    Addended by Werner Lean, MD on 06/15/2021  8:22 PM   Study Result  Narrative & Impression  EXAM: OVER-READ INTERPRETATION  CT CHEST   The following report is an over-read performed by radiologist Dr. Yetta Glassman of [facility], [place] on 06/15/2021. This over-read does not include interpretation of cardiac or coronary anatomy or pathology. The coronary calcium score/coronary CTA interpretation by the cardiologist is attached.   COMPARISON:  None.   FINDINGS: Extracardiac findings will be described separately under dictation for contemporaneously obtained CTA chest, abdomen and pelvis.   IMPRESSION: Please see separate dictation for contemporaneously obtained CTA chest, abdomen and pelvis dated 06/15/2021 for full description of relevant extracardiac  findings.     Recent Labs: 08/08/2020: TSH 2.122 05/23/2021: ALT 12; B Natriuretic Peptide 3,900.0 05/25/2021: Magnesium 2.2 05/29/2021: BUN 29; Creatinine, Ser 4.40; Hemoglobin 8.5; Platelets 128; Potassium 3.8; Sodium 134   Lipid Panel    Component Value Date/Time   CHOL 136 02/07/2020 2130   TRIG 136 02/07/2020 2130   HDL 21 (L) 02/07/2020 2130   CHOLHDL 6.5 02/07/2020 2130   VLDL 27 02/07/2020 2130   LDLCALC 88 02/07/2020 2130   LDLDIRECT 87.8 02/10/2010 0839     Wt Readings from Last 3 Encounters:  06/25/21 179 lb 12.8 oz (81.6 kg)  05/29/21 175 lb 6.4 oz (79.6 kg)  05/12/21 184 lb 4.9 oz (83.6 kg)     Other studies Reviewed: Additional studies/ records that were reviewed today include: CT images, cath films, echo images Review of the above records demonstrates: severe AS   STS Risk Score: Risk of Mortality:  6.378%  Renal Failure:  NA  Permanent Stroke:  2.582%  Prolonged Ventilation:  18.515%  DSW Infection:  0.235%  Reoperation:  5.623%  Morbidity or Mortality:  28.922%  Short Length  of Stay:  17.176%  Long Length of Stay:  11.168%    Assessment and Plan:   1. Severe Aortic Valve Stenosis: He has severe, stage D2 aortic valve stenosis. I have personally reviewed the echo images. The aortic valve is thickened, calcified with limited leaflet mobility. The valve does open but I agree that he likely has severe stenosis. I think he would benefit from AVR. Given advanced age and comorbidities including ESRD on HD, prior open heart surgery x 2, he is not a candidate for conventional AVR by surgical approach. We will consider TAVR as a treatment option for his severe AS but his only option for access may be trans-apical and this will have to be reviewed with our surgery team.   I have reviewed the natural history of aortic stenosis with the patient and their family members  who are present today. We have discussed the limitations of medical therapy and the poor  prognosis associated with symptomatic aortic stenosis. We have reviewed potential treatment options, including palliative medical therapy, conventional surgical aortic valve replacement, and transcatheter aortic valve replacement. We discussed treatment options in the context of the patient's specific comorbid medical conditions.   He would like to proceed with planning for TAVR. Risks and benefits of the valve procedure are reviewed with the patient. I will review his findings at our structural team meeting next week. We will then proceed with surgical consultation with Dr. Cyndia Bent.     2. 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 Will plan manual compression with the vascular tech when he is admitted for his TAVR.   Current medicines are reviewed at length with the patient today.  The patient does not have concerns regarding medicines.  The following changes have been made:  no change  Labs/ tests ordered today include:  No orders of the defined types were placed in this encounter.    Disposition:   F/U with the valve team.    Signed, Lauree Chandler, MD 06/25/2021 3:06 PM    [address], [place], [place]  [address] Phone: [phone]; Fax: [phone]

## 2021-07-14 ENCOUNTER — Institutional Professional Consult (permissible substitution) (INDEPENDENT_AMBULATORY_CARE_PROVIDER_SITE_OTHER): Payer: No Typology Code available for payment source | Admitting: Surgery

## 2021-07-14 ENCOUNTER — Other Ambulatory Visit: Payer: Self-pay

## 2021-07-14 VITALS — BP 143/66 | HR 67 | Resp 20 | Ht 69.0 in | Wt 179.0 lb

## 2021-07-14 DIAGNOSIS — I350 Nonrheumatic aortic (valve) stenosis: Secondary | ICD-10-CM

## 2021-07-14 NOTE — Progress Notes (Signed)
[facility]   [facility]LIN[facility] CLINIC         [address]       [place], [address]             [phone]          CARDIOTHORACIC SURGERY CONSULTATION REPORT  PCP is [facility] Referring Provider is Lauree Chandler, MD Primary Cardiologist is Carlyle Dolly, MD  Reason for consultation: Severe aortic stenosis  HPI:  The patient is a 76 year old gentleman with a history of hypertension, hyperlipidemia, hypothyroidism, previous smoking and COPD, ESRD on hemodialysis, OSA, prior stroke, coronary artery disease status post CABG in 2008 by me and redo CABG in 2018 by me as well as severe aortic stenosis.  He was admitted to [facility] in January 2023 with NSTEMI.  An echocardiogram on 05/24/2021 showed an ejection fraction of 45% with a calcified aortic valve with a mean gradient of 24 mmHg.  Peak gradient was 41 mmHg with a valve area of 0.9 cm and dimensionless index of 0.24.  Ejection fraction was 45% with a stroke-volume index of 38.  There is mild to moderate mitral regurgitation.  He underwent cardiac catheterization on 05/28/2021 showing severe native three-vessel coronary disease.  There is a patent free RIMA graft to the diagonal, patent right radial graft to the PL, and a patent LIMA to the LAD.  There is moderate ostial left circumflex stenosis which was not grafted.  No culprit lesion was identified.  It was felt that the patient's symptoms are most likely due to severe low-flow/low gradient aortic stenosis.  He is here today with his wife.  He reports worsening shortness of breath and fatigue with exertion.  He has had no further chest discomfort since leaving the hospital.  He denies any dizziness or syncope.  He denies orthopnea.  He has had no peripheral edema.  Past Medical History:  Diagnosis Date   Acute myocardial infarction of other inferior wall, initial episode of care    Anemia    Anxiety    Arthritis     Cancer ([place])    bladder 2013 removed, has cystoscopy 10/2016, has returned x 2   COPD (chronic obstructive pulmonary disease) ([place])    Coronary artery disease    Artery bypass graft Jan 2008   Dialysis patient [facility])    GERD (gastroesophageal reflux disease)    History of hiatal hernia    Hyperlipidemia    Hypertension    Hypothyroidism    Severe aortic stenosis    Sleep apnea    Stroke ([place])    Tobacco user     Past Surgical History:  Procedure Laterality Date   APPENDECTOMY     AV FISTULA PLACEMENT Right 02/11/2020   Procedure: RIGHT ARM BRACHIOCEPHALIC ARTERIOVENOUS (AV) FISTULA;  Surgeon: Angelia Mould, MD;  Location: [facility];  Service: Vascular;  Laterality: Right;   CARDIAC CATHETERIZATION     COLONOSCOPY WITH PROPOFOL N/A 09/02/2020   Procedure: COLONOSCOPY WITH PROPOFOL;  Surgeon: Rogene Houston, MD;  [facility];  Service: Endoscopy;  Laterality: N/A;   CORONARY ARTERY BYPASS GRAFT  06/14/2006   x 5   CORONARY ARTERY BYPASS GRAFT N/A 09/19/2016   Procedure: REDO CORONARY ARTERY BYPASS GRAFTING (CABG) x two, using right internal mammary artery and left radial artery;  Surgeon: [name], MD;  Location: [place];  Service: Open Heart Surgery;  Laterality: N/A;   FOOT SURGERY  HEMOSTASIS CLIP PLACEMENT  09/02/2020   Procedure: HEMOSTASIS CLIP PLACEMENT;  Surgeon: Rogene Houston, MD;  [facility];  Service: Endoscopy;;   HERNIA REPAIR     HOT HEMOSTASIS  09/02/2020   Procedure: HOT HEMOSTASIS (ARGON PLASMA COAGULATION/BICAP);  Surgeon: Rogene Houston, MD;  [facility];  Service: Endoscopy;;   LEFT HEART CATH AND CORS/GRAFTS ANGIOGRAPHY N/A 09/14/2016   Procedure: Left Heart Cath and Cors/Grafts Angiography;  Surgeon: Troy Sine, MD;  Location: [place] CV LAB;  Service: Cardiovascular;  Laterality: N/A;   LEFT HEART CATHETERIZATION WITH CORONARY/GRAFT ANGIOGRAM N/A 01/31/2014   Procedure: LEFT HEART CATHETERIZATION WITH  Beatrix Fetters;  Surgeon: Sanda Klein, MD;  Location: [place] CATH LAB;  Service: Cardiovascular;  Laterality: N/A;   NOSE SURGERY     PERCUTANEOUS CORONARY STENT INTERVENTION (PCI-S)  01/31/2014   Procedure: PERCUTANEOUS CORONARY STENT INTERVENTION (PCI-S);  Surgeon: Sanda Klein, MD;  Location: [facility] CATH LAB;  Service: Cardiovascular;;   POLYPECTOMY  09/02/2020   Procedure: POLYPECTOMY;  Surgeon: Rogene Houston, MD;  [facility];  Service: Endoscopy;;   RADIAL ARTERY HARVEST Left 09/19/2016   Procedure: RADIAL ARTERY HARVEST;  Surgeon: [name], MD;  Location: [place];  Service: Open Heart Surgery;  Laterality: Left;   RIGHT/LEFT HEART CATH AND CORONARY/GRAFT ANGIOGRAPHY N/A 05/28/2021   Procedure: RIGHT/LEFT HEART CATH AND CORONARY/GRAFT ANGIOGRAPHY;  Surgeon: Burnell Blanks, MD;  Location: [place] CV LAB;  Service: Cardiovascular;  Laterality: N/A;   TEE WITHOUT CARDIOVERSION N/A 09/19/2016   Procedure: TRANSESOPHAGEAL ECHOCARDIOGRAM (TEE);  Surgeon: [name], MD;  Location: [place];  Service: Open Heart Surgery;  Laterality: N/A;    Family History  Problem Relation Age of Onset   Heart attack Mother    CVA Mother    CVA Father    Heart attack Brother    Colon cancer Neg Hx    Colon polyps Neg Hx     Social History   Socioeconomic History   Marital status: Married    Spouse name: Not on file   Number of children: 2   Years of education: Not on file   Highest education level: Not on file  Occupational History   Occupation: Retired-Worked in Psychologist, educational  Tobacco Use   Smoking status: Former    Packs/day: 1.00    Years: 40.00    Pack years: 40.00    Types: Cigarettes    Start date: 02/20/1959    Quit date: 09/13/2016    Years since quitting: 4.8   Smokeless tobacco: Never  Vaping Use   Vaping Use: Never used  Substance and Sexual Activity   Alcohol use: No    Alcohol/week: 0.0 standard drinks    Comment: "No, not really"   Drug  use: No   Sexual activity: Not Currently  Other Topics Concern   Not on file  Social History Narrative   Not on file   Social Determinants of Health   Financial Resource Strain: Not on file  Food Insecurity: Not on file  Transportation Needs: Not on file  Physical Activity: Not on file  Stress: Not on file  Social Connections: Not on file  Intimate Partner Violence: Not on file    Prior to Admission medications   Medication Sig Start Date End Date Taking? Authorizing Provider  acetaminophen (TYLENOL) 325 MG tablet Take 2 tablets (650 mg total) by mouth every 6 (six) hours as needed for mild pain. 09/23/16  Yes  Lars Pinks M, PA-C  allopurinol (ZYLOPRIM) 100 MG tablet Take 100 mg by mouth daily.  05/03/19  Yes [provider]  aspirin EC 81 MG EC tablet Take 1 tablet (81 mg total) by mouth daily. Swallow whole. 05/30/21  Yes Arrien, Jimmy Picket, MD  atorvastatin (LIPITOR) 80 MG tablet Take 80 mg by mouth daily.   Yes [provider]  clopidogrel (PLAVIX) 75 MG tablet Take 75 mg by mouth daily.   Yes [provider]  finasteride (PROSCAR) 5 MG tablet Take 5 mg by mouth daily.   Yes [provider]  folic acid (FOLVITE) 1 MG tablet Take 1 mg by mouth daily.   Yes [provider]  furosemide (LASIX) 80 MG tablet Take 1 tablet (80 mg total) by mouth every Tuesday, Thursday, Saturday, and Sunday. 09/05/20  Yes Tat, Shanon Brow, MD  isosorbide mononitrate (IMDUR) 120 MG 24 hr tablet Take 1 tablet (120 mg total) by mouth daily. 05/18/19  Yes Emokpae, Courage, MD  levothyroxine (SYNTHROID, LEVOTHROID) 50 MCG tablet Take 50 mcg by mouth daily before breakfast.   Yes [provider]  nitroGLYCERIN (NITROSTAT) 0.4 MG SL tablet Place 0.4 mg under the tongue every 5 (five) minutes as needed for chest pain (max 3 doses).   Yes [provider]  Omega-3 Fatty Acids (RA FISH OIL) 1000 MG CAPS Take 2,000 mg by mouth 2 (two) times daily.    Yes [provider]  omeprazole (PRILOSEC) 40 MG capsule Take 20 mg by mouth daily. 02/14/20  Yes [provider]  tamsulosin (FLOMAX) 0.4 MG CAPS capsule Take 0.4 mg by mouth daily.   Yes [provider]  carvedilol (COREG) 6.25 MG tablet Take 1 tablet (6.25 mg total) by mouth 2 (two) times daily. 05/29/21 06/28/21  Arrien, Jimmy Picket, MD    Current Outpatient Medications  Medication Sig Dispense Refill   acetaminophen (TYLENOL) 325 MG tablet Take 2 tablets (650 mg total) by mouth every 6 (six) hours as needed for mild pain.     allopurinol (ZYLOPRIM) 100 MG tablet Take 100 mg by mouth daily.      aspirin EC 81 MG EC tablet Take 1 tablet (81 mg total) by mouth daily. Swallow whole. 30 tablet 0   atorvastatin (LIPITOR) 80 MG tablet Take 80 mg by mouth daily.     clopidogrel (PLAVIX) 75 MG tablet Take 75 mg by mouth daily.     finasteride (PROSCAR) 5 MG tablet Take 5 mg by mouth daily.     folic acid (FOLVITE) 1 MG tablet Take 1 mg by mouth daily.     furosemide (LASIX) 80 MG tablet Take 1 tablet (80 mg total) by mouth every Tuesday, Thursday, Saturday, and Sunday.     isosorbide mononitrate (IMDUR) 120 MG 24 hr tablet Take 1 tablet (120 mg total) by mouth daily. 30 tablet 5   levothyroxine (SYNTHROID, LEVOTHROID) 50 MCG tablet Take 50 mcg by mouth daily before breakfast.     nitroGLYCERIN (NITROSTAT) 0.4 MG SL tablet Place 0.4 mg under the tongue every 5 (five) minutes as needed for chest pain (max 3 doses).     Omega-3 Fatty Acids (RA FISH OIL) 1000 MG CAPS Take 2,000 mg by mouth 2 (two) times daily.     omeprazole (PRILOSEC) 40 MG capsule Take 20 mg by mouth daily.     tamsulosin (FLOMAX) 0.4 MG CAPS capsule Take 0.4 mg by mouth daily.     carvedilol (COREG) 6.25 MG tablet Take 1  tablet (6.25 mg total) by mouth 2 (two) times daily. 60 tablet 0   No current facility-administered medications for this visit.    Allergies  Allergen Reactions   Cardizem Cd  [Diltiazem Hcl Er Beads] Palpitations    "Makes heart skip"      Review of Systems:   General:  normal appetite, + decreased energy, no weight gain, no weight loss, no fever  Cardiac:  no chest pain with exertion, no chest pain at rest, +SOB with mild exertion, no resting SOB, no PND, no orthopnea, no palpitations, no arrhythmia, no atrial fibrillation, no LE edema, no dizzy spells, no syncope  Respiratory:  + exertional shortness of breath, no home oxygen, no productive cough, no dry cough, no bronchitis, no wheezing, no hemoptysis, no asthma, no pain with inspiration or cough, + sleep apnea, + CPAP at night  GI:   no difficulty swallowing, + reflux, no frequent heartburn, + hiatal hernia, no abdominal pain, no constipation, no diarrhea, no hematochezia, no hematemesis, no melena  GU:   no dysuria,  no frequency, no urinary tract infection, no hematuria, no enlarged prostate, no kidney stones, + end stage kidney disease  Vascular:  + pain suggestive of claudication, + pain in feet, + leg cramps, no varicose veins, no DVT, no non-healing foot ulcer  Neuro:   + stroke, + TIA's, no seizures, no headaches, no temporary blindness one eye,  no slurred speech, no peripheral neuropathy, no chronic pain, no instability of gait, no memory/cognitive dysfunction  Musculoskeletal: + arthritis, no joint swelling, no myalgias, + difficulty walking, + decreased mobility   Skin:   no rash, no itching, no skin infections, no pressure sores or ulcerations  Psych:   no anxiety, no depression, no nervousness, no unusual recent stress  Eyes:   no blurry vision, no floaters, no recent vision changes, no wears glasses or contacts  ENT:   + hearing loss, no loose or painful teeth, + dentures  Hematologic:  + easy bruising, no abnormal bleeding, no clotting disorder, no frequent epistaxis  Endocrine:  no diabetes, does not check CBG's at home     Physical Exam:   BP (!) 143/66 (BP Location: Left Arm, Patient  Position: Sitting)    Pulse 67    Resp 20    Ht 5\' 9"  (1.753 m)    Wt 179 lb (81.2 kg)    SpO2 97% Comment: RA   BMI 26.43 kg/m   General:  well-appearing  HEENT:  Unremarkable, NCAT, PERLA, EOMI  Neck:   no JVD, no bruits, no adenopathy   Chest:   clear to auscultation, symmetrical breath sounds, no wheezes, no rhonchi   CV:   RRR, 3/6 systolic murmur RSB, no diastolic murmur  Abdomen:  soft, non-tender, no masses   Extremities:  warm, well-perfused, pulses not palpable at ankle, no lower extremity edema  Rectal/GU  Deferred  Neuro:   Grossly non-focal and symmetrical throughout  Skin:   Clean and dry, no rashes, no breakdown  Diagnostic Tests:  ECHOCARDIOGRAM REPORT         Patient Name:   [name] Date of Exam: 05/24/2021  Medical Rec #:  [id number]      Height:       69.0 in  Accession #:    [id number]     Weight:       181.9 lb  Date of Birth:  1945/10/22      BSA:  1.984 m  Patient Age:    24 years       BP:           141/28 mmHg  Patient Gender: M              HR:           76 bpm.  Exam Location:  Inpatient   Procedure: 2D Echo, 3D Echo, Cardiac Doppler and Color Doppler                                  MODIFIED REPORT:       This report was modified by Dorris Carnes MD on 05/24/2021 due to complete                                    impression.   Indications:     NSTEMI I21.4     History:         Patient has prior history of Echocardiogram examinations,  most                   recent 04/06/2021. Previous Myocardial Infarction and  CAD,                   Prior CABG, COPD, Aortic Valve Disease; Risk                   Factors:Dyslipidemia and Hypertension.     Sonographer:     Merrie Roof RDCS  Referring Phys:  [place]  Diagnosing Phys: Dorris Carnes MD   IMPRESSIONS     1. Inferolateral hypokinesis.. Left ventricular ejection fraction, by  estimation, is 45%%. The left ventricular internal cavity size was  moderately dilated. There is mild left  ventricular hypertrophy. Left  ventricular diastolic parameters are consistent   with Grade II diastolic dysfunction (pseudonormalization). Elevated left  atrial pressure.   2. Right ventricular systolic function is normal. The right ventricular  size is normal. There is moderately elevated pulmonary artery systolic  pressure.   3. Left atrial size was moderately dilated.   4. Right atrial size was mildly dilated.   5. Mild to moderate mitral valve regurgitation.   6. AV is thickened, calcified with restricted motion. Peak and mean  gradients through the valve are 41 and 24 mm Hg respectively. AVA (VTI) is  0.92 cm2 Dimensionless index is 0.24 consistent with severe AS. Note SVI  is 38, lower end of normal.. Aortic  valve regurgitation is mild to moderate.   7. The inferior vena cava is normal in size with greater than 50%  respiratory variability, suggesting right atrial pressure of 3 mmHg.   FINDINGS   Left Ventricle: Inferolateral hypokinesis. Left ventricular ejection  fraction, by estimation, is 45%%. The left ventricular internal cavity  size was moderately dilated. There is mild left ventricular hypertrophy.  Left ventricular diastolic parameters  are consistent with Grade II diastolic dysfunction (pseudonormalization).  Elevated left atrial pressure.   Right Ventricle: The right ventricular size is normal. Right vetricular  wall thickness was not assessed. Right ventricular systolic function is  normal. There is moderately elevated pulmonary artery systolic pressure.  The tricuspid regurgitant velocity is   3.38 m/s, and with an assumed right atrial pressure of 3 mmHg, the  estimated right ventricular systolic pressure is 31.5 mmHg.  Left Atrium: Left atrial size was moderately dilated.   Right Atrium: Right atrial size was mildly dilated.   Pericardium: There is no evidence of pericardial effusion.   Mitral Valve: There is mild thickening of the mitral valve  leaflet(s).  Mild to moderate mitral annular calcification. Mild to moderate mitral  valve regurgitation.   Tricuspid Valve: The tricuspid valve is normal in structure. Tricuspid  valve regurgitation is mild.   Aortic Valve: AV is thickened, calcified with restricted motion. Peak and  mean gradients through the valve are 41 and 24 mm Hg respectively. AVA  (VTI) is 0.92 cm2 Dimensionless index is 0.24 consistent with severe AS.  Note SVI is 38, lower end of normal.   Aortic valve regurgitation is mild to moderate. Aortic regurgitation PHT  measures 230 msec. Aortic valve mean gradient measures 23.5 mmHg. Aortic  valve peak gradient measures 41.9 mmHg. Aortic valve area, by VTI measures  0.90 cm.   Pulmonic Valve: The pulmonic valve was normal in structure. Pulmonic valve  regurgitation is mild.   Aorta: The aortic root and ascending aorta are structurally normal, with  no evidence of dilitation.   Venous: The inferior vena cava is normal in size with greater than 50%  respiratory variability, suggesting right atrial pressure of 3 mmHg.   IAS/Shunts: No atrial level shunt detected by color flow Doppler.      LEFT VENTRICLE  PLAX 2D  LVIDd:         5.70 cm      Diastology  LVIDs:         4.20 cm      LV e' medial:    3.15 cm/s  LV PW:         1.20 cm      LV E/e' medial:  35.6  LV IVS:        1.10 cm      LV e' lateral:   5.66 cm/s  LVOT diam:     2.20 cm      LV E/e' lateral: 19.8  LV SV:         75  LV SV Index:   38  LVOT Area:     3.80 cm                                 3D Volume EF:  LV Volumes (MOD)            3D EF:        44 %  LV vol d, MOD A2C: 189.0 ml LV EDV:       252 ml  LV vol d, MOD A4C: 197.0 ml LV ESV:       141 ml  LV vol s, MOD A2C: 117.0 ml LV SV:        111 ml  LV vol s, MOD A4C: 120.0 ml  LV SV MOD A2C:     72.0 ml  LV SV MOD A4C:     197.0 ml  LV SV MOD BP:      73.4 ml   RIGHT VENTRICLE            IVC  RV Basal diam:  3.90 cm    IVC diam: 1.60  cm  RV S prime:     7.95 cm/s  TAPSE (M-mode): 2.6 cm   LEFT ATRIUM            Index  RIGHT ATRIUM           Index  LA diam:      5.10 cm  2.57 cm/m   RA Area:     20.50 cm  LA Vol (A2C): 108.0 ml 54.43 ml/m  RA Volume:   63.90 ml  32.20 ml/m   AORTIC VALVE  AV Area (Vmax):    1.00 cm  AV Area (Vmean):   0.96 cm  AV Area (VTI):     0.90 cm  AV Vmax:           323.50 cm/s  AV Vmean:          232.000 cm/s  AV VTI:            0.835 m  AV Peak Grad:      41.9 mmHg  AV Mean Grad:      23.5 mmHg  LVOT Vmax:         85.30 cm/s  LVOT Vmean:        58.300 cm/s  LVOT VTI:          0.198 m  LVOT/AV VTI ratio: 0.24  AI PHT:            230 msec     AORTA  Ao Root diam: 3.50 cm  Ao Asc diam:  3.20 cm   MITRAL VALVE                TRICUSPID VALVE  MV Area (PHT): 4.49 cm     TR Peak grad:   45.7 mmHg  MV Decel Time: 169 msec     TR Vmax:        338.00 cm/s  MV E velocity: 112.00 cm/s  MV A velocity: 84.80 cm/s   SHUNTS  MV E/A ratio:  1.32         Systemic VTI:  0.20 m                              Systemic Diam: 2.20 cm   Dorris Carnes MD  Electronically signed by Dorris Carnes MD  Signature Date/Time: 05/24/2021/4:07:58 PM         Final (Updated)     Physicians  Panel Physicians Referring Physician Case Authorizing Physician  Burnell Blanks, MD (Primary)     Procedures  RIGHT/LEFT HEART CATH AND CORONARY/GRAFT ANGIOGRAPHY   Conclusion      Prox LAD to Mid LAD lesion is 100% stenosed.   Mid LM to Dist LM lesion is 50% stenosed.   Ost Cx to Prox Cx lesion is 50% stenosed.   Prox RCA lesion is 99% stenosed.   RPAV lesion is 80% stenosed.   Mid LAD to Dist LAD lesion is 30% stenosed.   Mid RCA lesion is 50% stenosed.   RIMA graft was visualized by angiography and is normal in caliber.   LIMA graft was visualized by angiography and is normal in caliber.   Left radial artery graft was visualized by angiography and is normal in caliber.   Severe three vessel  CAD s/p CABG and redo CABG. The most recent CABG in 2018 with Free RIMA graft to Diagonal and right radial artery to posterolateral artery, both patent. The LIMA to the LAD remains patent. I did not engage the old vein grafts which are known to be occluded.  The distal left main has a moderate stenosis The LAD is occluded in the mid segment. Patent  LIMA to mid LAD. The free RIMA graft to the Diagonal is patent The Circumflex is not grafted. The ostial Circumflex has moderate stenosis The RCA is a large dominant artery. The proximal RCA has a severe stenosis. Competitive graft flow seen in the mid and distal RCA. The right radial artery graft to the posterolateral artery is patent. This fills the PDA, posterolateral artery and the distal RCA.  Aortic stenosis, likely severe low flow/low gradient by echo. Cath data: mean gradient 11 mmHg, peak to peak gradient 14 mmHg)   He has no obvious culprit lesions for NSTEMI. Continue medical management of CAD. He likely has severe low flow/low gradient aortic stenosis. We will consider him a candidate for TAVR. If stable, he could be discharged home over the weekend and we could plan pre TAVR CT scans as an outpatient. If he remains inpatient, we could get his CT scans next week. We will follow up Monday and make further plans.    Indications  Non-ST elevation (NSTEMI) myocardial infarction (HCC) [I21.4 (ICD-10-CM)]  Nonrheumatic aortic valve stenosis [I35.0 (ICD-10-CM)]   Procedural Details  Technical Details Indication: 76 yo male with history of ESRD on HD, CAD s/p CABG in 2008 and redo CABG in 2018 admitted with a NSTEMI. Echo with reduced LV systolic function with severe low flow/low gradient aortic stenosis.   Procedure: The risks, benefits, complications, treatment options, and expected outcomes were discussed with the patient. The patient and/or family concurred with the proposed plan, giving informed consent. The patient was sedated with Versed and  Fentanyl. The right groin was prepped and draped in the usual manner. Using the modified Seldinger access technique, a 5 French sheath was placed in the right femoral artery and a 7 French sheath was placed in the right femoral vein using u/s guidance. Standard diagnostic catheters were used to perform selective coronary angiography. I engaged the native RCA and both of the vein grafts arising from the ascending aorta with the JR4 catheter. The LIMA graft was non-selectively visualized with an IMA catheter. The aortic valve was crossed with the JR4 catheter and the J wire.   There were no immediate complications. The patient was taken to the recovery area in stable condition.     Estimated blood loss <50 mL.   During this procedure medications were administered to achieve and maintain moderate conscious sedation while the patient's heart rate, blood pressure, and oxygen saturation were continuously monitored and I was present face-to-face 100% of this time.   Medications (Filter: Administrations occurring from 1446 to 1603 on 05/28/21)  important  Continuous medications are totaled by the amount administered until 05/28/21 1603.   Heparin (Porcine) in NaCl 1000-0.9 UT/500ML-% SOLN (mL) Total volume:  1,500 mL Date/Time Rate/Dose/Volume Action   05/28/21 1451 500 mL Given   1451 500 mL Given   1509 500 mL Given    lidocaine (PF) (XYLOCAINE) 1 % injection (mL) Total volume:  10 mL Date/Time Rate/Dose/Volume Action   05/28/21 1517 10 mL Given    fentaNYL (SUBLIMAZE) injection (mcg) Total dose:  25 mcg Date/Time Rate/Dose/Volume Action   05/28/21 1517 12.5 mcg Given   1520 12.5 mcg Given    midazolam (VERSED) injection (mg) Total dose:  1 mg Date/Time Rate/Dose/Volume Action   05/28/21 1517 0.5 mg Given   1520 0.5 mg Given    iohexol (OMNIPAQUE) 350 MG/ML injection (mL) Total volume:  105 mL Date/Time Rate/Dose/Volume Action   05/28/21 1558 105 mL Given    0.9 %  sodium  chloride infusion (Manually program via Guardrails IV Fluids) Total dose:  Cannot be calculated* Dosing weight:  83 *Administration dose not documented Date/Time Rate/Dose/Volume Action   05/28/21 1446 *Not included in total MAR Hold    0.9 %  sodium chloride infusion (Manually program via Guardrails IV Fluids) Total dose:  Cannot be calculated* Dosing weight:  81.7 *Administration dose not documented Date/Time Rate/Dose/Volume Action   05/28/21 1446 *Not included in total MAR Hold    0.9 %  sodium chloride infusion (mL) Total dose:  Cannot be calculated* Dosing weight:  83.5 *Administration dose not documented Date/Time Rate/Dose/Volume Action   05/28/21 1446 *Not included in total MAR Hold    0.9 %  sodium chloride infusion (mL) Total dose:  Cannot be calculated* Dosing weight:  83.5 *Administration dose not documented Date/Time Rate/Dose/Volume Action   05/28/21 1446 *Not included in total MAR Hold    0.9 %  sodium chloride infusion (mL) Total dose:  Cannot be calculated* Dosing weight:  83.5 *Administration dose not documented Date/Time Rate/Dose/Volume Action   05/28/21 1446 *Not included in total MAR Hold    aspirin EC tablet 81 mg (mg) Total dose:  Cannot be calculated* Dosing weight:  82.6 *Administration dose not documented Date/Time Rate/Dose/Volume Action   05/28/21 1446 *Not included in total MAR Hold    atorvastatin (LIPITOR) tablet 80 mg (mg) Total dose:  Cannot be calculated* Dosing weight:  83.5 *Administration dose not documented Date/Time Rate/Dose/Volume Action   05/28/21 1446 *Not included in total MAR Hold    carvedilol (COREG) tablet 6.25 mg (mg) Total dose:  Cannot be calculated* Dosing weight:  83.5 *Administration dose not documented Date/Time Rate/Dose/Volume Action   05/28/21 1446 *Not included in total MAR Hold    Chlorhexidine Gluconate Cloth 2 % PADS 6 each (each) Total dose:  Cannot be calculated* Dosing weight:  82.5 *Administration  dose not documented Date/Time Rate/Dose/Volume Action   05/28/21 1446 *Not included in total MAR Hold    clopidogrel (PLAVIX) tablet 75 mg (mg) Total dose:  Cannot be calculated* Dosing weight:  83.5 *Administration dose not documented Date/Time Rate/Dose/Volume Action   05/28/21 1446 *Not included in total MAR Hold    HYDROmorphone (DILAUDID) injection 0.5-1 mg (mg) Total dose:  Cannot be calculated* Dosing weight:  83.5 *Administration dose not documented Date/Time Rate/Dose/Volume Action   05/28/21 1446 *Not included in total MAR Hold    levothyroxine (SYNTHROID) tablet 50 mcg (mcg) Total dose:  Cannot be calculated* Dosing weight:  83.5 *Administration dose not documented Date/Time Rate/Dose/Volume Action   05/28/21 1446 *Not included in total MAR Hold    lidocaine (PF) (XYLOCAINE) 1 % injection 5 mL (mL) Total dose:  Cannot be calculated* Dosing weight:  83.5 *Administration dose not documented Date/Time Rate/Dose/Volume Action   05/28/21 1446 *Not included in total MAR Hold    lidocaine-prilocaine (EMLA) cream 1 application (application) Total dose:  Cannot be calculated* Dosing weight:  83.5 *Administration dose not documented Date/Time Rate/Dose/Volume Action   05/28/21 1446 *Not included in total MAR Hold    melatonin tablet 10 mg (mg) Total dose:  Cannot be calculated* Dosing weight:  80.4 *Administration dose not documented Date/Time Rate/Dose/Volume Action   05/28/21 1446 *Not included in total MAR Hold    nitroGLYCERIN (NITROSTAT) SL tablet 0.4 mg (mg) Total dose:  Cannot be calculated* Dosing weight:  83.5 *Administration dose not documented Date/Time Rate/Dose/Volume Action   05/28/21 1446 *Not included in total MAR Hold    pantoprazole (PROTONIX) EC tablet  40 mg (mg) Total dose:  Cannot be calculated* Dosing weight:  83 *Administration dose not documented Date/Time Rate/Dose/Volume Action   05/28/21 1446 *Not included in total MAR Hold     pentafluoroprop-tetrafluoroeth (GEBAUERS) aerosol 1 application (application) Total dose:  Cannot be calculated* Dosing weight:  83.5 *Administration dose not documented Date/Time Rate/Dose/Volume Action   05/28/21 1446 *Not included in total MAR Hold    sodium chloride flush (NS) 0.9 % injection 3 mL (mL) Total dose:  Cannot be calculated* Dosing weight:  83 *Administration dose not documented Date/Time Rate/Dose/Volume Action   05/28/21 1446 *Not included in total MAR Hold    Sedation Time  Sedation Time Physician-1: 37 minutes 54 seconds Contrast  Medication Name Total Dose  iohexol (OMNIPAQUE) 350 MG/ML injection 105 mL   Radiation/Fluoro  Fluoro time: 15.5 (min) DAP: 50266 (mGycm2) Cumulative Air Kerma: 094 (mGy) Complications  Complications documented before study signed (05/28/2021  7:09 PM)   No complications were associated with this study.  Documented by Gilford Rile, RT - 05/28/2021  4:00 PM     Coronary Findings  Diagnostic Dominance: Right Left Main  Mid LM to Dist LM lesion is 50% stenosed.    Left Anterior Descending  Vessel is large.  Prox LAD to Mid LAD lesion is 100% stenosed. The lesion is chronically occluded.  Mid LAD to Dist LAD lesion is 30% stenosed.    Left Circumflex  Vessel is large.  Ost Cx to Prox Cx lesion is 50% stenosed.    Right Coronary Artery  Vessel is large.  Prox RCA lesion is 99% stenosed.  Mid RCA lesion is 50% stenosed.    Right Posterior Atrioventricular Artery  RPAV lesion is 80% stenosed.    RIMA Graft To 1st Diag  RIMA graft was visualized by angiography and is normal in caliber. Free RIMA graft to Diagonal    LIMA LIMA Graft To Dist LAD  LIMA graft was visualized by angiography and is normal in caliber.    Left Radial Artery Graft To RPAV  Left radial artery graft was visualized by angiography and is normal in caliber.    Intervention   No interventions have been documented.   Coronary  Diagrams  Diagnostic Dominance: Right Intervention  Implants     No implant documentation for this case.   Syngo Images   Show images for CARDIAC CATHETERIZATION Images on Long Term Storage   Show images for [place], [place] to Procedure Log  Procedure Log    Hemo Data  Flowsheet Row Most Recent Value  Fick Cardiac Output 12.25 L/min  Fick Cardiac Output Index 6.19 (L/min)/BSA  Aortic Mean Gradient 11.27 mmHg  Aortic Peak Gradient 14 mmHg  Aortic Valve Area >3.50  Aortic Value Area Index 1.77 cm2/BSA  RA A Wave 2 mmHg  RA V Wave 3 mmHg  RA Mean 1 mmHg  RV Systolic Pressure 52 mmHg  RV Diastolic Pressure -3 mmHg  RV EDP 3 mmHg  PA Systolic Pressure 55 mmHg  PA Diastolic Pressure 20 mmHg  PA Mean 34 mmHg  PW A Wave 21 mmHg  PW V Wave 27 mmHg  PW Mean 19 mmHg  AO Systolic Pressure 628 mmHg  AO Diastolic Pressure 35 mmHg  AO Mean 72 mmHg  LV Systolic Pressure 366 mmHg  LV Diastolic Pressure 10 mmHg  LV EDP 28 mmHg  AOp Systolic Pressure 294 mmHg  AOp Diastolic Pressure 36 mmHg  AOp Mean Pressure 69 mmHg  LVp Systolic Pressure  141 mmHg  LVp Diastolic Pressure 13 mmHg  LVp EDP Pressure 28 mmHg  QP/QS 1  TPVR Index 5.49 HRUI  TSVR Index 11.63 HRUI  PVR SVR Ratio 0.21  TPVR/TSVR Ratio 0.47    ADDENDUM REPORT: 06/15/2021 13:41   CLINICAL DATA:  Aortic Valve pathology with assessment for TAVR   EXAM: Cardiac TAVR CT   TECHNIQUE: The patient was scanned on a Siemens Force 458 slice scanner. A 120 kV retrospective scan was triggered in the descending thoracic aorta at 111 HU's. Gantry rotation speed was 270 msecs and collimation was .9 mm. No beta blockade or nitro were given. The 3D data set was reconstructed in 5% intervals of the R-R cycle. Systolic and diastolic phases were analyzed on a dedicated work station using MPR, MIP and VRT modes. The patient received 100 cc of contrast.   FINDINGS: Aortic Valve: Severely thickened aortic valve with  heavy calcification and moderately reduced excursion the planimeter valve area is 1.32 sq cm consistent with moderate to severe aortic stenosis   Number of leaflets: 3   LVOT calcification: Mild- there is a single, adherent, non-protruding focus of calcification   Annular calcification: Mild- there is a single, adherent, non-protruding focus of calcification   Aortic Valve Calcium Score: 1933   Presence of basal septal hypertrophy: yes   - Diastolic annulus measurements: Area 5.2 cm2, Perimeter 88; despite hypertrophy, annulus is large in systole than diastole (see below).   Perimembranous septal diameter: 12 mm   Aortic Annulus Measurements- 25% phase assessed   Major annulus diameter: 32 mm   Minor annulus diameter: 26 mm   Annular perimeter: 89 mm   Annular area: 5.83 cm2   Aortic Root Measurements   Sinotubular Junction: 31 mm   Ascending Thoracic Aorta: 33 mm   Aortic Arch: 28 mm   Descending Thoracic Aorta: 28 mm   Sinus of Valsalva Measurements:   Right coronary cusp width: 30 mm   Left coronary cusp width: 34 mm   Non coronary cusp width: 34 mm   Mean diameter: 32 mm   Coronary Artery Height above Annulus:   Left Main: 17 mm   Left SoV height: 21 mm   Right Coronary: 21 mm   Right SoV height: 15 mm   Optimum Fluoroscopic Angle for Delivery: LAO 7 CAU 9   Valve Considerations:   29 mm Edwards Sapien preferred   34 mm Evolut lower limit annular diameter, there is mild sinus asymmetry but with sufficient coronary height, Sinus of Valsalva Diameter and Height   Coronary Arteries:   S/p CABG with evidence of prior RIMA and LIMA, both which appear largely patent, thought there is some degree of calcification at the LIMA insertion site.   Native arteries are significantly calcified with scan not optimized to address patency.   IMPRESSION: 1. Moderate to Severe Aortic Stenosis. Findings pertinent to TAVR procedure are detailed  above.   Mahesh  Chandrasekhar     Electronically Signed   By: Rudean Haskell M.D.   On: 06/15/2021 13:41    Addended by Werner Lean, MD on 06/15/2021  8:22 PM   Study Result  Narrative & Impression  EXAM: OVER-READ INTERPRETATION  CT CHEST   The following report is an over-read performed by radiologist Dr. Yetta Glassman of [facility], [place] on 06/15/2021. This over-read does not include interpretation of cardiac or coronary anatomy or pathology. The coronary calcium score/coronary CTA interpretation by the cardiologist is attached.   COMPARISON:  None.   FINDINGS: Extracardiac findings will be described separately under dictation for contemporaneously obtained CTA chest, abdomen and pelvis.   IMPRESSION: Please see separate dictation for contemporaneously obtained CTA chest, abdomen and pelvis dated 06/15/2021 for full description of relevant extracardiac findings.   Electronically Signed: By: Yetta Glassman M.D. On: 06/15/2021 12:05      Narrative & Impression  CLINICAL DATA:  Preop evaluation for TAVR   EXAM: CT ANGIOGRAPHY CHEST, ABDOMEN AND PELVIS   TECHNIQUE: Multidetector CT imaging through the chest, abdomen and pelvis was performed using the standard protocol during bolus administration of intravenous contrast. Multiplanar reconstructed images and MIPs were obtained and reviewed to evaluate the vascular anatomy.   RADIATION DOSE REDUCTION: This exam was performed according to the departmental dose-optimization program which includes automated exposure control, adjustment of the mA and/or kV according to patient size and/or use of iterative reconstruction technique.   CONTRAST:  175mL OMNIPAQUE IOHEXOL 350 MG/ML SOLN   COMPARISON:  CT abdomen and pelvis dated April 05, 2021   FINDINGS: CTA CHEST FINDINGS   Cardiovascular: Mild cardiomegaly. No significant pericardial effusion/thickening. Three-vessel coronary  artery calcifications status post CABG. Severe calcified and noncalcified plaque of the thoracic aorta great vessels are normal in course and caliber. no central pulmonary emboli.   Mediastinum/Nodes: No discrete thyroid nodules. Unremarkable esophagus. No pathologically enlarged axillary, mediastinal or hilar lymph nodes.   Lungs/Pleura: No pneumothorax. Small right pleural effusion. Mild centrilobular emphysema. Mild interlobular septal thickening primarily seen at the lung apices and mild ground-glass opacity of the left upper lobe. Small solid clustered nodules of the left lingula. Largest is located on series 5, image 57 and measures 5 mm in mean diameter.   Musculoskeletal: No aggressive appearing focal osseous lesions. Moderate degenerative changes at T6-C7 with associated endplate changes. Prior median sternotomy with intact sternal wires   CTA ABDOMEN AND PELVIS FINDINGS   Hepatobiliary: Normal liver with no liver mass. Gallbladder wall irregularity with adjacent inflammatory changes and gallstones. No biliary ductal dilatation.   Pancreas: Normal, with no mass or duct dilation.   Spleen: Normal size. No mass.   Adrenals/Urinary Tract: Unchanged size of bilateral adrenal nodules which demonstrated low attenuation on prior noncontrast exam, likely benign adenomas. No hydronephrosis. Renal vascular calcifications with no definite nephrolithiasis. Unchanged indeterminate left adrenal lesion measuring 1.3 cm on series 4, image 129. Additional bilateral renal lesions are compatible with simple cysts and unchanged compared to prior. Bladder is decompressed.   Stomach/Bowel: Normal non-distended stomach. Normal caliber small bowel with no small bowel wall thickening. Appendix is not visualized. Diverticulosis primarily of the sigmoid colon. Normal large bowel with no diverticulosis, large bowel wall thickening or pericolonic fat stranding.   Vascular/Lymphatic: Fluid  collection of the right groin measuring 3.9 x 3.0 cm with rounded central area of contrast enhancement that measures up to 1.3 cm with neck leading to the right common femoral artery. Severe atherosclerotic disease consisting of calcified and noncalcified plaque of the abdominal aorta. Major branch vessels are patent with mild to moderate atherosclerotic disease. 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 No pathologically enlarged lymph nodes in the abdomen or pelvis.   Reproductive: Mild prostatomegaly.   Other: No pneumoperitoneum, ascites or focal fluid collection.   Musculoskeletal: No aggressive appearing focal osseous lesions.  VASCULAR MEASUREMENTS PERTINENT TO TAVR:   AORTA:   Minimal Aortic Diameter -  8.6 mm   Severity of Aortic Calcification-severe   RIGHT PELVIS:   Right Common Iliac Artery -   Minimal Diameter-4.3 mm   Tortuosity-none   Calcification-severe   Right External Iliac Artery -   Minimal Diameter-3.1 mm   Tortuosity-none   Calcification-severe   Right Common Femoral Artery -   Minimal Diameter-2.2 mm   Tortuosity-none   Calcification-severe   LEFT PELVIS:   Left Common Iliac Artery -   Minimal Diameter-3.7 mm   Tortuosity-none   Calcification-severe   Left External Iliac Artery -   Minimal Diameter-4.0 mm   Tortuosity-mild   Calcification-severe   Left Common Femoral Artery -   Minimal Diameter- occluded   Tortuosity-none   Calcification-severe   Review of the MIP images confirms the above findings.   IMPRESSION: Vascular:   1. Vascular findings and measurements pertinent to potential TAVR procedure, as detailed above. 2. Severe thickening calcification of the aortic valve, compatible with reported  clinical history of severe aortic stenosis. 3. 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 4. 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 5. 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   Nonvascular:   1. Gallbladder wall irregularity with adjacent inflammatory changes and gallstones, concerning for chronic cholecystitis since findings were present but have worsened have worsened when compared with prior exam dated April 05, 2021. 2. Unchanged size of indeterminate left adrenal lesion measuring 1.3 cm. Further evaluation with dedicated abdominal MRI should be considered. 3. Interlobular septal thickening and mild ground-glass opacity of the left upper lobe, likely due to pulmonary edema. 4. Small right pleural effusion. 5. Small clustered nodules of the left lingula, likely infectious or inflammatory. Largest measures 5 mm. No follow-up needed if patient is low-risk (and has no known or suspected primary neoplasm). Non-contrast chest CT can be considered in 12 months if patient is high-risk. This recommendation follows the consensus statement: Guidelines for Management of Incidental Pulmonary Nodules Detected on CT Images: From the Fleischner Society 2017; Radiology 2017; 284:228-243.     Electronically Signed   By: Yetta Glassman M.D.   On: 06/15/2021 13:38     Impression:  This 76 year old gentleman has stage D 2, severe, symptomatic, low-flow/low gradient aortic valve stenosis with NYHA class III symptoms of exertional fatigue and shortness of breath consistent with chronic diastolic congestive heart failure.  He had a recent admission for chest discomfort and ruled in for NSTEMI with no culprit seen on catheterization and it was felt that this was likely  due to his severe aortic stenosis.  I have personally reviewed his 2D echocardiogram, cardiac catheterization, and CTA studies.  His aortic valve is calcified and thickened with restricted leaflet mobility.  The mean gradient is 24 mmHg with a valve area of 0.92 cm and dimensionless index of 0.24 consistent with severe aortic stenosis.  Stroke-volume index is low at 38.  There is mild to moderate aortic insufficiency.  There is mild to moderate mitral regurgitation.  Left ventricular ejection fraction is mildly reduced at 45% with grade 2 diastolic dysfunction and mild LV dilation at 5.7 cm during diastole.  Cardiac catheterization showed severe native three-vessel coronary disease with 3 patent arterial bypasses and no culprit lesion for his NSTEMI.  The mean gradient  across the aortic valve at catheterization was 11 mmHg with a peak to peak gradient of 14 mmHg.  I think the appearance of his aortic valve on echocardiogram looks like severe aortic stenosis and there is probably moderate aortic insufficiency.  AI pressure half-time is 230 ms.  I think this combination is likely responsible for his symptoms.  I agree that aortic valve replacement is indicated for relief of his symptoms and to prevent further left ventricular dysfunction.  His gated cardiac CTA shows anatomy suitable for TAVR using a 29 mm SAPIEN 3 valve.  Unfortunately he has severe diffuse calcific atherosclerosis that would preclude using either femoral artery, either carotid artery, and either subclavian artery for insertion.  I think the only option available would be to perform transapical insertion which would have increased risk given his 2 prior cardiac surgeries.  I think this is still the best option for him.  The patient and his wife were counseled at length regarding treatment alternatives for management of severe symptomatic aortic stenosis. The risks and benefits of surgical intervention has been discussed in detail. Long-term  prognosis with medical therapy was discussed. Alternative approaches such as conventional surgical aortic valve replacement, transcatheter aortic valve replacement, and palliative medical therapy were compared and contrasted at length. This discussion was placed in the context of the patient's own specific clinical presentation and past medical history. All of their questions have been addressed.   Following the decision to proceed with transcatheter aortic valve replacement, a discussion was held regarding what types of management strategies would be attempted intraoperatively in the event of life-threatening complications, including whether or not the patient would be considered a candidate for the use of cardiopulmonary bypass and/or conversion to open sternotomy for attempted surgical intervention.  I do not think the patient is a candidate for emergent sternotomy to manage any intraoperative complications given his 2 prior cardiac surgeries.  The patient is aware of the fact that transient use of cardiopulmonary bypass may be necessary. The patient has been advised of a variety of complications that might develop including but not limited to risks of death, stroke, paravalvular leak, aortic dissection or other major vascular complications, aortic annulus rupture, device embolization, cardiac rupture or perforation, mitral regurgitation, acute myocardial infarction, arrhythmia, heart block or bradycardia requiring permanent pacemaker placement, congestive heart failure, respiratory failure, renal failure, pneumonia, infection, other late complications related to structural valve deterioration or migration, or other complications that might ultimately cause a temporary or permanent loss of functional independence or other long term morbidity. The patient provides full informed consent for the procedure as described and all questions were answered.      Plan:  He will be scheduled for transapical insertion  of a SAPIEN 3 valve towards the end of March.  I told him to discontinue the Plavix at least 5 days before surgery and to discontinue the fish oil now.  He will continue aspirin 81 mg daily.  I spent 60 minutes performing this consultation and > 50% of this time was spent face to face counseling and coordinating the care of this patient's severe symptomatic aortic stenosis.   [name], MD 07/14/2021

## 2021-07-27 ENCOUNTER — Other Ambulatory Visit: Payer: Self-pay

## 2021-07-27 DIAGNOSIS — I350 Nonrheumatic aortic (valve) stenosis: Secondary | ICD-10-CM

## 2021-08-03 ENCOUNTER — Other Ambulatory Visit: Payer: Self-pay | Admitting: Physician Assistant

## 2021-08-03 DIAGNOSIS — I729 Aneurysm of unspecified site: Secondary | ICD-10-CM

## 2021-08-04 ENCOUNTER — Ambulatory Visit: Payer: No Typology Code available for payment source

## 2021-08-05 NOTE — Progress Notes (Signed)
Surgical Instructions ? ? ? Your procedure is scheduled on 08/10/21. ? Report to [facility] Main Entrance "A" at 8:45 A.M., then check in with the Admitting office. ? Call this number if you have problems the morning of surgery: ? [phone] ? ? If you have any questions prior to your surgery date call [phone]: Open Monday-Friday 8am-4pm ? ? ? Remember: ? Do not eat or drink after midnight the night before your surgery ? ?  ? Take these medicines the morning of surgery with A SIP OF WATER:  ?levothyroxine (SYNTHROID, LEVOTHROID)  ? ?STOP Fish Oil  per 07/14/21 instructions at Dr. Laurene Footman office. ? ?STOP taking PLAVIX on Thursday 08/05/21.  You will take your last dose on Wednesday 08/04/21. ? ?Continue taking all other medications including ASPIRIN without change through the day before surgery. ? ?On the morning of surgery take only Levothroxine with a sip of water.  ? ?As of 08/03/21, STOP taking any Aleve, Naproxen, Ibuprofen, Motrin, Advil, Goody's, BC's, all herbal medications, fish oil, and all vitamins. ? ?         ?Do not wear jewelry  ?Do not wear lotions, powders, colognes, or deodorant. ?Do not shave 48 hours prior to surgery.  Men may shave face and neck. ?Do not bring valuables to the hospital. ? ? ?[facility] is not responsible for any belongings or valuables. .  ? ?Do NOT Smoke (Tobacco/Vaping)  24 hours prior to your procedure ? ?If you use a CPAP at night, you may bring your mask for your overnight stay. ?  ?Contacts, glasses, hearing aids, dentures or partials may not be worn into surgery, please bring cases for these belongings ?  ?For patients admitted to the hospital, discharge time will be determined by your treatment team. ?  ?Patients discharged the day of surgery will not be allowed to drive home, and someone needs to stay with them for 24 hours. ? ? ?SURGICAL WAITING ROOM VISITATION ?Patients having surgery or a procedure in a hospital may have two support people. ?Children under the age  of 7 must have an adult with them who is not the patient. ?They may stay in the waiting area during the procedure and may switch out with other visitors. If the patient needs to stay at the hospital during part of their recovery, the visitor guidelines for inpatient rooms apply. ? ?Please refer to the [facility] website for the visitor guidelines for Inpatients (after your surgery is over and you are in a regular room).  ? ? ? ? ? ?Special instructions:   ? ?Oral Hygiene is also important to reduce your risk of infection.  Remember - BRUSH YOUR TEETH THE MORNING OF SURGERY WITH YOUR REGULAR TOOTHPASTE ? ? ?[facility]- Preparing For Surgery ? ?Before surgery, you can play an important role. Because skin is not sterile, your skin needs to be as free of germs as possible. You can reduce the number of germs on your skin by washing with CHG (chlorahexidine gluconate) Soap before surgery.  CHG is an antiseptic cleaner which kills germs and bonds with the skin to continue killing germs even after washing.   ? ? ?Please do not use if you have an allergy to CHG or antibacterial soaps. If your skin becomes reddened/irritated stop using the CHG.  ?Do not shave (including legs and underarms) for at least 48 hours prior to first CHG shower. It is OK to shave your face. ? ?Please follow these instructions carefully. ?  ? ?  Shower the NIGHT BEFORE SURGERY and the MORNING OF SURGERY with CHG Soap.  ? If you chose to wash your hair, wash your hair first as usual with your normal shampoo. After you shampoo, rinse your hair and body thoroughly to remove the shampoo.  Then [employer] and genitals (private parts) with your normal soap and rinse thoroughly to remove soap. ? ?After that Use CHG Soap as you would any other liquid soap. You can apply CHG directly to the skin and wash gently with a scrungie or a clean washcloth.  ? ?Apply the CHG Soap to your body ONLY FROM THE NECK DOWN.  Do not use on open wounds or open sores. Avoid  contact with your eyes, ears, mouth and genitals (private parts). Wash Face and genitals (private parts)  with your normal soap.  ? ?Wash thoroughly, paying special attention to the area where your surgery will be performed. ? ?Thoroughly rinse your body with warm water from the neck down. ? ?DO NOT shower/wash with your normal soap after using and rinsing off the CHG Soap. ? ?Pat yourself dry with a CLEAN TOWEL. ? ?Wear CLEAN PAJAMAS to bed the night before surgery ? ?Place CLEAN SHEETS on your bed the night before your surgery ? ?DO NOT SLEEP WITH PETS. ? ? ?Day of Surgery: ? ?Take a shower with CHG soap. ?Wear Clean/Comfortable clothing the morning of surgery ?Do not apply any deodorants/lotions.   ?Remember to brush your teeth WITH YOUR REGULAR TOOTHPASTE. ? ? ? ?If you received a COVID test during your pre-op visit  it is requested that you wear a mask when out in public, stay away from anyone that may not be feeling well and notify your surgeon if you develop symptoms. If you have been in contact with anyone that has tested positive in the last 10 days please notify you surgeon. ? ?  ?Please read over the following fact sheets that you were given.   ?

## 2021-08-06 ENCOUNTER — Encounter (HOSPITAL_COMMUNITY)
Admission: RE | Admit: 2021-08-06 | Discharge: 2021-08-06 | Disposition: A | Discharge status: Home / Self Care | Payer: No Typology Code available for payment source | Source: Ambulatory Visit | Attending: Cardiovascular Disease | Admitting: Cardiovascular Disease

## 2021-08-06 ENCOUNTER — Other Ambulatory Visit: Payer: Self-pay

## 2021-08-06 ENCOUNTER — Encounter (HOSPITAL_COMMUNITY): Payer: Self-pay

## 2021-08-06 ENCOUNTER — Ambulatory Visit (HOSPITAL_COMMUNITY)
Admission: RE | Admit: 2021-08-06 | Discharge: 2021-08-06 | Disposition: A | Discharge status: Home / Self Care | Payer: No Typology Code available for payment source | Source: Ambulatory Visit | Attending: Cardiovascular Disease | Admitting: Cardiovascular Disease

## 2021-08-06 VITALS — BP 142/68 | HR 59 | Temp 97.7°F | Resp 17 | Ht 69.0 in | Wt 178.3 lb

## 2021-08-06 DIAGNOSIS — Z01818 Encounter for other preprocedural examination: Secondary | ICD-10-CM | POA: Insufficient documentation

## 2021-08-06 DIAGNOSIS — I350 Nonrheumatic aortic (valve) stenosis: Secondary | ICD-10-CM | POA: Insufficient documentation

## 2021-08-06 DIAGNOSIS — Z87891 Personal history of nicotine dependence: Secondary | ICD-10-CM | POA: Diagnosis not present

## 2021-08-06 DIAGNOSIS — I10 Essential (primary) hypertension: Secondary | ICD-10-CM | POA: Diagnosis not present

## 2021-08-06 DIAGNOSIS — Z20822 Contact with and (suspected) exposure to covid-19: Secondary | ICD-10-CM | POA: Insufficient documentation

## 2021-08-06 DIAGNOSIS — Z951 Presence of aortocoronary bypass graft: Secondary | ICD-10-CM | POA: Insufficient documentation

## 2021-08-06 HISTORY — DX: Unspecified hearing loss, unspecified ear: H91.90

## 2021-08-06 HISTORY — DX: Chronic kidney disease, unspecified: N18.9

## 2021-08-06 LAB — BLOOD GAS, ARTERIAL
Acid-Base Excess: 5.9 mmol/L — ABNORMAL HIGH (ref 0.0–2.0)
Bicarbonate: 29.7 mmol/L — ABNORMAL HIGH (ref 20.0–28.0)
Drawn by: 60286
O2 Saturation: 99.3 %
Patient temperature: 37
pCO2 arterial: 39 mmHg (ref 32–48)
pH, Arterial: 7.49 — ABNORMAL HIGH (ref 7.35–7.45)
pO2, Arterial: 104 mmHg (ref 83–108)

## 2021-08-06 LAB — URINALYSIS, ROUTINE W REFLEX MICROSCOPIC
Bilirubin Urine: NEGATIVE
Glucose, UA: NEGATIVE mg/dL
Hgb urine dipstick: NEGATIVE
Ketones, ur: 5 mg/dL — AB
Leukocytes,Ua: NEGATIVE
Nitrite: NEGATIVE
Protein, ur: >=300 mg/dL — AB
Specific Gravity, Urine: 1.019 (ref 1.005–1.030)
pH: 5 (ref 5.0–8.0)

## 2021-08-06 LAB — CBC
HCT: 30.8 % — ABNORMAL LOW (ref 39.0–52.0)
Hemoglobin: 9.7 g/dL — ABNORMAL LOW (ref 13.0–17.0)
MCH: 28.4 pg (ref 26.0–34.0)
MCHC: 31.5 g/dL (ref 30.0–36.0)
MCV: 90.1 fL (ref 80.0–100.0)
Platelets: 188 10*3/uL (ref 150–400)
RBC: 3.42 MIL/uL — ABNORMAL LOW (ref 4.22–5.81)
RDW: 17.2 % — ABNORMAL HIGH (ref 11.5–15.5)
WBC: 3.4 10*3/uL — ABNORMAL LOW (ref 4.0–10.5)
nRBC: 0 % (ref 0.0–0.2)

## 2021-08-06 LAB — COMPREHENSIVE METABOLIC PANEL
ALT: 15 U/L (ref 0–44)
AST: 17 U/L (ref 15–41)
Albumin: 2.9 g/dL — ABNORMAL LOW (ref 3.5–5.0)
Alkaline Phosphatase: 89 U/L (ref 38–126)
Anion gap: 9 (ref 5–15)
BUN: 15 mg/dL (ref 8–23)
CO2: 25 mmol/L (ref 22–32)
Calcium: 8.3 mg/dL — ABNORMAL LOW (ref 8.9–10.3)
Chloride: 103 mmol/L (ref 98–111)
Creatinine, Ser: 3.59 mg/dL — ABNORMAL HIGH (ref 0.61–1.24)
GFR, Estimated: 17 mL/min — ABNORMAL LOW (ref >60)
Glucose, Bld: 109 mg/dL — ABNORMAL HIGH (ref 70–99)
Potassium: 3.7 mmol/L (ref 3.5–5.1)
Sodium: 137 mmol/L (ref 135–145)
Total Bilirubin: 0.6 mg/dL (ref 0.3–1.2)
Total Protein: 6.6 g/dL (ref 6.5–8.1)

## 2021-08-06 LAB — SURGICAL PCR SCREEN
MRSA, PCR: NEGATIVE
Staphylococcus aureus: NEGATIVE

## 2021-08-06 LAB — TYPE AND SCREEN
ABO/RH(D): A POS
Antibody Screen: NEGATIVE

## 2021-08-06 LAB — SARS CORONAVIRUS 2 (TAT 6-24 HRS): SARS Coronavirus 2: NEGATIVE

## 2021-08-06 LAB — PROTIME-INR
INR: 1.2 (ref 0.8–1.2)
Prothrombin Time: 15.5 seconds — ABNORMAL HIGH (ref 11.4–15.2)

## 2021-08-06 NOTE — Progress Notes (Signed)
Informed MD to review abnormal UA results from today's PAT appointment.   Protein in UA was >300 mg/dl and Ketones was '5mg'$ /dl with a few bacteria. ?

## 2021-08-06 NOTE — Progress Notes (Signed)
DUE TO COVID-19 ONLY ONE VISITOR IS ALLOWED TO COME WITH YOU AND STAY IN THE WAITING ROOM ONLY DURING PRE OP AND PROCEDURE DAY OF SURGERY.  ? ?Two VISITORS MAY VISIT WITH YOU AFTER SURGERY IN YOUR PRIVATE ROOM DURING VISITING HOURS ONLY! ? ?[facility] [place] ?Cardiologist - [facility] in [place] ? ?Chest x-ray - 08/06/21 ?EKG - 08/06/21 ?Stress Test - 03/14/17 ?ECHO - 09/19/16 ?Cardiac Cath - 05/28/21 ? ?ICD Pacemaker/Loop - n/a ? ?Sleep Study -  Yes ?CPAP - uses CPAP nightly ? ?Aspirin Instructions: Follow your surgeon's instructions on when to stop aspirin prior to surgery,  If no instructions were given by your surgeon then you will need to call the office for those instructions. ? ?Anesthesia review: Yes ? ?STOP now taking any Aspirin (unless otherwise instructed by your surgeon), Aleve, Naproxen, Ibuprofen, Motrin, Advil, Goody's, BC's, all herbal medications, fish oil, and all vitamins.  ? ?Coronavirus Screening ?Covid test is scheduled at PAT ?Do you have any of the following symptoms:  ?Cough yes/no: No ?Fever (>100.84F)  yes/no: No ?Runny nose yes/no: No ?Sore throat yes/no: No ?Difficulty breathing/shortness of breath  yes/no: No ? ?Have you traveled in the last 14 days and where? yes/no: No ? ?Patient verbalized understanding of instructions that were given to them at the PAT appointment. Patient was also instructed that they will need to review over the PAT instructions again at home before surgery. ? ?

## 2021-08-09 MED ORDER — MAGNESIUM SULFATE 50 % IJ SOLN
40.0000 meq | INTRAMUSCULAR | Status: DC
  Filled 2021-08-09: qty 9.85

## 2021-08-09 MED ORDER — NOREPINEPHRINE 4 MG/250ML-% IV SOLN
0.0000 ug/min | INTRAVENOUS | Status: AC
  Administered 2021-08-10: 2 ug/min via INTRAVENOUS
  Filled 2021-08-09: qty 250

## 2021-08-09 MED ORDER — DEXMEDETOMIDINE HCL IN NACL 400 MCG/100ML IV SOLN
0.1000 ug/kg/h | INTRAVENOUS | Status: DC
  Filled 2021-08-09: qty 100

## 2021-08-09 MED ORDER — HEPARIN 30,000 UNITS/1000 ML (OHS) CELLSAVER SOLUTION
Status: DC
  Filled 2021-08-09: qty 1000

## 2021-08-09 MED ORDER — POTASSIUM CHLORIDE 2 MEQ/ML IV SOLN
80.0000 meq | INTRAVENOUS | Status: DC
  Filled 2021-08-09: qty 40

## 2021-08-09 MED ORDER — CEFAZOLIN SODIUM-DEXTROSE 2-4 GM/100ML-% IV SOLN
2.0000 g | INTRAVENOUS | Status: AC
  Administered 2021-08-10: 2 g via INTRAVENOUS
  Filled 2021-08-09: qty 100

## 2021-08-09 NOTE — H&P (Signed)
? ? ?   ?[address] ?      [place] [address] ?            [phone]   ? ?  ?Cardiothoracic Surgery Admission History and Physical ? ?[facility]P is [facility] ?Referring Provider is Lauree Chandler, MD ?Primary Cardiologist is Carlyle Dolly, MD ?  ?Reason for admission: Severe aortic stenosis ?  ?HPI: ?  ?The patient is a 76 year old gentleman with a history of hypertension, hyperlipidemia, hypothyroidism, previous smoking and COPD, ESRD on hemodialysis, OSA, prior stroke, coronary artery disease status post CABG in 2008 by me and redo CABG in 2018 by me as well as severe aortic stenosis.  He was admitted to [facility] in January 2023 with NSTEMI.  An echocardiogram on 05/24/2021 showed an ejection fraction of 45% with a calcified aortic valve with a mean gradient of 24 mmHg.  Peak gradient was 41 mmHg with a valve area of 0.9 cm? and dimensionless index of 0.24.  Ejection fraction was 45% with a stroke-volume index of 38.  There is mild to moderate mitral regurgitation.  He underwent cardiac catheterization on 05/28/2021 showing severe native three-vessel coronary disease.  There is a patent free RIMA graft to the diagonal, patent right radial graft to the PL, and a patent LIMA to the LAD.  There is moderate ostial left circumflex stenosis which was not grafted.  No culprit lesion was identified.  It was felt that the patient's symptoms are most likely due to severe low-flow/low gradient aortic stenosis. ?  ?He lives with his wife.  He reports worsening shortness of breath and fatigue with exertion.  He has had no further chest discomfort since leaving the hospital.  He denies any dizziness or syncope.  He denies orthopnea.  He has had no peripheral edema. ?  ?    ?Past Medical History:  ?Diagnosis Date  ? Acute myocardial infarction of other inferior wall, initial episode of care    ? Anemia    ? Anxiety    ? Arthritis    ? Cancer [facility])    ?  bladder 2013 removed, has cystoscopy  10/2016, has returned x 2  ? COPD (chronic obstructive pulmonary disease) ([place])    ? Coronary artery disease    ?  Artery bypass graft Jan 2008  ? Dialysis patient [facility])    ? GERD (gastroesophageal reflux disease)    ? History of hiatal hernia    ? Hyperlipidemia    ? Hypertension    ? Hypothyroidism    ? Severe aortic stenosis    ? Sleep apnea    ? Stroke [facility])    ? Tobacco user    ?  ?  ?     ?Past Surgical History:  ?Procedure Laterality Date  ? APPENDECTOMY      ? AV FISTULA PLACEMENT Right 02/11/2020  ?  Procedure: RIGHT ARM BRACHIOCEPHALIC ARTERIOVENOUS (AV) FISTULA;  Surgeon: Angelia Mould, MD;  Location: [facility] OR;  Service: Vascular;  Laterality: Right;  ? CARDIAC CATHETERIZATION      ? COLONOSCOPY WITH PROPOFOL N/A 09/02/2020  ?  Procedure: COLONOSCOPY WITH PROPOFOL;  Surgeon: Rogene Houston, MD;  [facility];  Service: Endoscopy;  Laterality: N/A;  ? CORONARY ARTERY BYPASS GRAFT   06/14/2006  ?  x 5  ? CORONARY ARTERY BYPASS GRAFT N/A 09/19/2016  ?  Procedure: REDO CORONARY ARTERY BYPASS GRAFTING (CABG) x two, using right internal mammary artery and left  radial artery;  Surgeon: [name], MD;  Location: [facility] OR;  Service: Open Heart Surgery;  Laterality: N/A;  ? FOOT SURGERY      ? HEMOSTASIS CLIP PLACEMENT   09/02/2020  ?  Procedure: HEMOSTASIS CLIP PLACEMENT;  Surgeon: Rogene Houston, MD;  [facility];  Service: Endoscopy;;  ? HERNIA REPAIR      ? HOT HEMOSTASIS   09/02/2020  ?  Procedure: HOT HEMOSTASIS (ARGON PLASMA COAGULATION/BICAP);  Surgeon: Rogene Houston, MD;  [facility];  Service: Endoscopy;;  ? LEFT HEART CATH AND CORS/GRAFTS ANGIOGRAPHY N/A 09/14/2016  ?  Procedure: Left Heart Cath and Cors/Grafts Angiography;  Surgeon: Troy Sine, MD;  Location: [facility] CV LAB;  Service: Cardiovascular;  Laterality: N/A;  ? LEFT HEART CATHETERIZATION WITH CORONARY/GRAFT ANGIOGRAM N/A 01/31/2014  ?  Procedure: LEFT HEART CATHETERIZATION WITH Beatrix Fetters;  Surgeon: Sanda Klein, MD;  Location: [facility] CATH LAB;  Service: Cardiovascular;  Laterality: N/A;  ? NOSE SURGERY      ? PERCUTANEOUS CORONARY STENT INTERVENTION (PCI-S)   01/31/2014  ?  Procedure: PERCUTANEOUS CORONARY STENT INTERVENTION (PCI-S);  Surgeon: Sanda Klein, MD;  Location: [facility] CATH LAB;  Service: Cardiovascular;;  ? POLYPECTOMY   09/02/2020  ?  Procedure: POLYPECTOMY;  Surgeon: Rogene Houston, MD;  [facility];  Service: Endoscopy;;  ? RADIAL ARTERY HARVEST Left 09/19/2016  ?  Procedure: RADIAL ARTERY HARVEST;  Surgeon: [name], MD;  Location: [facility];  Service: Open Heart Surgery;  Laterality: Left;  ? RIGHT/LEFT HEART CATH AND CORONARY/GRAFT ANGIOGRAPHY N/A 05/28/2021  ?  Procedure: RIGHT/LEFT HEART CATH AND CORONARY/GRAFT ANGIOGRAPHY;  Surgeon: Burnell Blanks, MD;  Location: [facility] CV LAB;  Service: Cardiovascular;  Laterality: N/A;  ? TEE WITHOUT CARDIOVERSION N/A 09/19/2016  ?  Procedure: TRANSESOPHAGEAL ECHOCARDIOGRAM (TEE);  Surgeon: [name], MD;  Location: [facility];  Service: Open Heart Surgery;  Laterality: N/A;  ?  ?  ?     ?Family History  ?Problem Relation Age of Onset  ? Heart attack Mother    ? CVA Mother    ? CVA Father    ? Heart attack Brother    ? Colon cancer Neg Hx    ? Colon polyps Neg Hx    ?  ?  ?Social History  ?  ?     ?Socioeconomic History  ? Marital status: Married  ?    Spouse name: Not on file  ? Number of children: 2  ? Years of education: Not on file  ? Highest education level: Not on file  ?Occupational History  ? 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 Smoking status: Former  ?    Packs/day: 1.00  ?    Years: 40.00  ?    Pack years: 40.00  ?    Types: Cigarettes  ?    Start date: 02/20/1959  ?    Quit date: 09/13/2016  ?    Years since quitting: 4.8  ? Smokeless tobacco: Never  ?Vaping Use  ? Vaping Use: Never used  ?Substance and Sexual Activity  ? Alcohol use: No  ?    Alcohol/week: 0.0 standard drinks  ?     Comment: "No, not really"  ? Drug use: No  ? Sexual activity: Not Currently  ?Other Topics Concern  ? Not on file  ?Social History Narrative  ? Not on file  ?  ?Social Determinants of  Health  ?  ?Financial Resource Strain: Not on file  ?Food Insecurity: Not on file  ?Transportation Needs: Not on file  ?Physical Activity: Not on file  ?Stress: Not on file  ?Social Connections: Not on file  ?Intimate Partner Violence: Not on file  ?  ?  ?       ?Prior to Admission medications   ?Medication Sig Start Date End Date Taking? Authorizing Provider  ?acetaminophen (TYLENOL) 325 MG tablet Take 2 tablets (650 mg total) by mouth every 6 (six) hours as needed for mild pain. 09/23/16   Yes Lars Pinks M, PA-C  ?allopurinol (ZYLOPRIM) 100 MG tablet Take 100 mg by mouth daily.  05/03/19   Yes [provider]  ?aspirin EC 81 MG EC tablet Take 1 tablet (81 mg total) by mouth daily. Swallow whole. 05/30/21   Yes Arrien, Jimmy Picket, MD  ?atorvastatin (LIPITOR) 80 MG tablet Take 80 mg by mouth daily.     Yes [provider]  ?clopidogrel (PLAVIX) 75 MG tablet Take 75 mg by mouth daily.     Yes [provider]  ?finasteride (PROSCAR) 5 MG tablet Take 5 mg by mouth daily.     Yes [provider]  ?folic acid (FOLVITE) 1 MG tablet Take 1 mg by mouth daily.     Yes [provider]  ?furosemide (LASIX) 80 MG tablet Take 1 tablet (80 mg total) by mouth every Tuesday, Thursday, Saturday, and Sunday. 09/05/20   Yes Tat, Shanon Brow, MD  ?isosorbide mononitrate (IMDUR) 120 MG 24 hr tablet Take 1 tablet (120 mg total) by mouth daily. 05/18/19   Yes Roxan Hockey, MD  ?levothyroxine (SYNTHROID, LEVOTHROID) 50 MCG tablet Take 50 mcg by mouth daily before breakfast.     Yes [provider]  ?nitroGLYCERIN (NITROSTAT) 0.4 MG SL tablet Place 0.4 mg under the tongue every 5 (five) minutes as needed for chest pain (max 3 doses).     Yes [provider]  ?Omega-3 Fatty Acids (RA FISH  OIL) 1000 MG CAPS Take 2,000 mg by mouth 2 (two) times daily.     Yes [provider]  ?omeprazole (PRILOSEC) 40 MG capsule Take 20 mg by mouth daily. 02/14/20   Yes [provider]  ?tams

## 2021-08-10 ENCOUNTER — Inpatient Hospital Stay (HOSPITAL_COMMUNITY)
Admission: RE | Admit: 2021-08-10 | Discharge: 2021-08-10 | Disposition: A | Discharge status: Home / Self Care | Payer: No Typology Code available for payment source | Source: Ambulatory Visit | Attending: Cardiovascular Disease | Admitting: Cardiovascular Disease

## 2021-08-10 ENCOUNTER — Encounter (HOSPITAL_COMMUNITY): Payer: Self-pay | Admitting: Cardiovascular Disease

## 2021-08-10 ENCOUNTER — Encounter (HOSPITAL_COMMUNITY)
Admission: RE | Disposition: A | Discharge status: Home / Self Care | Payer: Self-pay | Source: Home / Self Care | Attending: Surgery

## 2021-08-10 ENCOUNTER — Inpatient Hospital Stay (HOSPITAL_COMMUNITY): Payer: No Typology Code available for payment source

## 2021-08-10 ENCOUNTER — Other Ambulatory Visit: Payer: Self-pay | Admitting: Cardiology

## 2021-08-10 ENCOUNTER — Other Ambulatory Visit: Payer: Self-pay

## 2021-08-10 ENCOUNTER — Inpatient Hospital Stay (HOSPITAL_COMMUNITY): Payer: No Typology Code available for payment source | Admitting: Vascular Surgery

## 2021-08-10 ENCOUNTER — Inpatient Hospital Stay (HOSPITAL_COMMUNITY)
Admission: RE | Admit: 2021-08-10 | Discharge: 2021-08-13 | DRG: 266 | Disposition: A | Discharge status: Home / Self Care | Payer: No Typology Code available for payment source | Attending: Surgery | Admitting: Surgery

## 2021-08-10 DIAGNOSIS — Z951 Presence of aortocoronary bypass graft: Secondary | ICD-10-CM

## 2021-08-10 DIAGNOSIS — I10 Essential (primary) hypertension: Secondary | ICD-10-CM | POA: Diagnosis present

## 2021-08-10 DIAGNOSIS — I120 Hypertensive chronic kidney disease with stage 5 chronic kidney disease or end stage renal disease: Secondary | ICD-10-CM | POA: Diagnosis present

## 2021-08-10 DIAGNOSIS — Z8249 Family history of ischemic heart disease and other diseases of the circulatory system: Secondary | ICD-10-CM

## 2021-08-10 DIAGNOSIS — Z7902 Long term (current) use of antithrombotics/antiplatelets: Secondary | ICD-10-CM

## 2021-08-10 DIAGNOSIS — I2510 Atherosclerotic heart disease of native coronary artery without angina pectoris: Secondary | ICD-10-CM | POA: Diagnosis present

## 2021-08-10 DIAGNOSIS — Z955 Presence of coronary angioplasty implant and graft: Secondary | ICD-10-CM | POA: Diagnosis not present

## 2021-08-10 DIAGNOSIS — Z823 Family history of stroke: Secondary | ICD-10-CM

## 2021-08-10 DIAGNOSIS — E782 Mixed hyperlipidemia: Secondary | ICD-10-CM | POA: Diagnosis present

## 2021-08-10 DIAGNOSIS — Z992 Dependence on renal dialysis: Secondary | ICD-10-CM

## 2021-08-10 DIAGNOSIS — I350 Nonrheumatic aortic (valve) stenosis: Principal | ICD-10-CM | POA: Diagnosis present

## 2021-08-10 DIAGNOSIS — G4733 Obstructive sleep apnea (adult) (pediatric): Secondary | ICD-10-CM | POA: Diagnosis present

## 2021-08-10 DIAGNOSIS — M898X9 Other specified disorders of bone, unspecified site: Secondary | ICD-10-CM | POA: Diagnosis present

## 2021-08-10 DIAGNOSIS — I214 Non-ST elevation (NSTEMI) myocardial infarction: Secondary | ICD-10-CM | POA: Diagnosis not present

## 2021-08-10 DIAGNOSIS — Z8709 Personal history of other diseases of the respiratory system: Secondary | ICD-10-CM

## 2021-08-10 DIAGNOSIS — Z006 Encounter for examination for normal comparison and control in clinical research program: Secondary | ICD-10-CM

## 2021-08-10 DIAGNOSIS — N186 End stage renal disease: Secondary | ICD-10-CM

## 2021-08-10 DIAGNOSIS — Z954 Presence of other heart-valve replacement: Secondary | ICD-10-CM | POA: Diagnosis not present

## 2021-08-10 DIAGNOSIS — K219 Gastro-esophageal reflux disease without esophagitis: Secondary | ICD-10-CM | POA: Diagnosis present

## 2021-08-10 DIAGNOSIS — I509 Heart failure, unspecified: Secondary | ICD-10-CM | POA: Diagnosis not present

## 2021-08-10 DIAGNOSIS — I110 Hypertensive heart disease with heart failure: Secondary | ICD-10-CM | POA: Diagnosis not present

## 2021-08-10 DIAGNOSIS — R1031 Right lower quadrant pain: Secondary | ICD-10-CM | POA: Diagnosis not present

## 2021-08-10 DIAGNOSIS — Z87891 Personal history of nicotine dependence: Secondary | ICD-10-CM

## 2021-08-10 DIAGNOSIS — Z79890 Hormone replacement therapy (postmenopausal): Secondary | ICD-10-CM

## 2021-08-10 DIAGNOSIS — Z952 Presence of prosthetic heart valve: Principal | ICD-10-CM

## 2021-08-10 DIAGNOSIS — M1990 Unspecified osteoarthritis, unspecified site: Secondary | ICD-10-CM | POA: Diagnosis present

## 2021-08-10 DIAGNOSIS — Z8551 Personal history of malignant neoplasm of bladder: Secondary | ICD-10-CM

## 2021-08-10 DIAGNOSIS — Z888 Allergy status to other drugs, medicaments and biological substances status: Secondary | ICD-10-CM

## 2021-08-10 DIAGNOSIS — Z7982 Long term (current) use of aspirin: Secondary | ICD-10-CM

## 2021-08-10 DIAGNOSIS — H9190 Unspecified hearing loss, unspecified ear: Secondary | ICD-10-CM | POA: Diagnosis present

## 2021-08-10 DIAGNOSIS — E039 Hypothyroidism, unspecified: Secondary | ICD-10-CM | POA: Diagnosis present

## 2021-08-10 DIAGNOSIS — D631 Anemia in chronic kidney disease: Secondary | ICD-10-CM | POA: Diagnosis present

## 2021-08-10 DIAGNOSIS — J449 Chronic obstructive pulmonary disease, unspecified: Secondary | ICD-10-CM | POA: Diagnosis present

## 2021-08-10 DIAGNOSIS — I252 Old myocardial infarction: Secondary | ICD-10-CM

## 2021-08-10 DIAGNOSIS — Z8673 Personal history of transient ischemic attack (TIA), and cerebral infarction without residual deficits: Secondary | ICD-10-CM | POA: Diagnosis not present

## 2021-08-10 DIAGNOSIS — Z79899 Other long term (current) drug therapy: Secondary | ICD-10-CM

## 2021-08-10 DIAGNOSIS — N400 Enlarged prostate without lower urinary tract symptoms: Secondary | ICD-10-CM | POA: Diagnosis present

## 2021-08-10 HISTORY — PX: TRANSCATHETER AORTIC VALVE REPLACEMENT, TRANSAPICAL: SHX6401

## 2021-08-10 HISTORY — DX: Presence of prosthetic heart valve: Z95.2

## 2021-08-10 HISTORY — PX: TEE WITHOUT CARDIOVERSION: SHX5443

## 2021-08-10 LAB — POCT I-STAT, CHEM 8
BUN: 39 mg/dL — ABNORMAL HIGH (ref 8–23)
BUN: 33 mg/dL — ABNORMAL HIGH (ref 8–23)
BUN: 33 mg/dL — ABNORMAL HIGH (ref 8–23)
BUN: 36 mg/dL — ABNORMAL HIGH (ref 8–23)
Calcium, Ion: 0.98 mmol/L — ABNORMAL LOW (ref 1.15–1.40)
Calcium, Ion: 1.1 mmol/L — ABNORMAL LOW (ref 1.15–1.40)
Calcium, Ion: 1.1 mmol/L — ABNORMAL LOW (ref 1.15–1.40)
Calcium, Ion: 1.13 mmol/L — ABNORMAL LOW (ref 1.15–1.40)
Chloride: 101 mmol/L (ref 98–111)
Chloride: 100 mmol/L (ref 98–111)
Chloride: 101 mmol/L (ref 98–111)
Chloride: 101 mmol/L (ref 98–111)
Creatinine, Ser: 5.6 mg/dL — ABNORMAL HIGH (ref 0.61–1.24)
Creatinine, Ser: 5.7 mg/dL — ABNORMAL HIGH (ref 0.61–1.24)
Creatinine, Ser: 5.6 mg/dL — ABNORMAL HIGH (ref 0.61–1.24)
Creatinine, Ser: 5 mg/dL — ABNORMAL HIGH (ref 0.61–1.24)
Glucose, Bld: 83 mg/dL (ref 70–99)
Glucose, Bld: 112 mg/dL — ABNORMAL HIGH (ref 70–99)
Glucose, Bld: 106 mg/dL — ABNORMAL HIGH (ref 70–99)
Glucose, Bld: 127 mg/dL — ABNORMAL HIGH (ref 70–99)
HCT: 33 % — ABNORMAL LOW (ref 39.0–52.0)
HCT: 29 % — ABNORMAL LOW (ref 39.0–52.0)
HCT: 29 % — ABNORMAL LOW (ref 39.0–52.0)
HCT: 34 % — ABNORMAL LOW (ref 39.0–52.0)
Hemoglobin: 11.2 g/dL — ABNORMAL LOW (ref 13.0–17.0)
Hemoglobin: 9.9 g/dL — ABNORMAL LOW (ref 13.0–17.0)
Hemoglobin: 9.9 g/dL — ABNORMAL LOW (ref 13.0–17.0)
Hemoglobin: 11.6 g/dL — ABNORMAL LOW (ref 13.0–17.0)
Potassium: 4.1 mmol/L (ref 3.5–5.1)
Potassium: 3.7 mmol/L (ref 3.5–5.1)
Potassium: 3.7 mmol/L (ref 3.5–5.1)
Potassium: 3.8 mmol/L (ref 3.5–5.1)
Sodium: 138 mmol/L (ref 135–145)
Sodium: 138 mmol/L (ref 135–145)
Sodium: 138 mmol/L (ref 135–145)
Sodium: 138 mmol/L (ref 135–145)
TCO2: 26 mmol/L (ref 22–32)
TCO2: 26 mmol/L (ref 22–32)
TCO2: 26 mmol/L (ref 22–32)
TCO2: 24 mmol/L (ref 22–32)

## 2021-08-10 LAB — ECHO TEE
AR max vel: 2.65 cm2
AV Area VTI: 2.18 cm2
AV Area mean vel: 1.42 cm2
AV Mean grad: 4 mmHg
AV Peak grad: 8.3 mmHg
Ao pk vel: 1.44 m/s
P 1/2 time: 290 msec

## 2021-08-10 SURGERY — REPLACEMENT, AORTIC VALVE, TRANSAPICAL APPROACH
Anesthesia: General | Site: Chest

## 2021-08-10 MED ORDER — HEPARIN SODIUM (PORCINE) 1000 UNIT/ML IJ SOLN
INTRAMUSCULAR | Status: AC
  Filled 2021-08-10: qty 10

## 2021-08-10 MED ORDER — LEVOTHYROXINE SODIUM 50 MCG PO TABS
50.0000 ug | ORAL_TABLET | Freq: Every day | ORAL | Status: DC
  Administered 2021-08-11 – 2021-08-13 (×3): 50 ug via ORAL
  Filled 2021-08-10 (×3): qty 1

## 2021-08-10 MED ORDER — CHLORHEXIDINE GLUCONATE 4 % EX LIQD: 60.0000 mL | Freq: Once | CUTANEOUS | Status: DC

## 2021-08-10 MED ORDER — CHLORHEXIDINE GLUCONATE 4 % EX LIQD
60.0000 mL | Freq: Once | CUTANEOUS | Status: DC
  Administered 2021-08-10: 4 via TOPICAL

## 2021-08-10 MED ORDER — HEPARIN 6000 UNIT IRRIGATION SOLUTION
Status: AC
  Filled 2021-08-10: qty 1500

## 2021-08-10 MED ORDER — FENTANYL CITRATE (PF) 250 MCG/5ML IJ SOLN
INTRAMUSCULAR | Status: AC
  Filled 2021-08-10: qty 5

## 2021-08-10 MED ORDER — HEPARIN SODIUM (PORCINE) 1000 UNIT/ML IJ SOLN
INTRAMUSCULAR | Status: DC | PRN
  Administered 2021-08-10: 8000 [IU] via INTRAVENOUS

## 2021-08-10 MED ORDER — TRAMADOL HCL 50 MG PO TABS
50.0000 mg | ORAL_TABLET | Freq: Two times a day (BID) | ORAL | Status: DC | PRN
  Administered 2021-08-10: 50 mg via ORAL
  Filled 2021-08-10: qty 1

## 2021-08-10 MED ORDER — HEPARIN 6000 UNIT IRRIGATION SOLUTION
Status: DC | PRN
  Administered 2021-08-10: 3

## 2021-08-10 MED ORDER — ALLOPURINOL 100 MG PO TABS
100.0000 mg | ORAL_TABLET | Freq: Every day | ORAL | Status: DC
  Administered 2021-08-11 – 2021-08-13 (×3): 100 mg via ORAL
  Filled 2021-08-10 (×3): qty 1

## 2021-08-10 MED ORDER — ACETAMINOPHEN 650 MG RE SUPP
650.0000 mg | Freq: Four times a day (QID) | RECTAL | Status: DC | PRN
Start: 2021-08-10 — End: 2021-08-11

## 2021-08-10 MED ORDER — ONDANSETRON HCL 4 MG/2ML IJ SOLN
INTRAMUSCULAR | Status: AC
  Filled 2021-08-10: qty 2

## 2021-08-10 MED ORDER — PROTAMINE SULFATE 10 MG/ML IV SOLN
INTRAVENOUS | Status: DC | PRN
  Administered 2021-08-10: 50 mg via INTRAVENOUS

## 2021-08-10 MED ORDER — SODIUM CHLORIDE 0.9% FLUSH
3.0000 mL | INTRAVENOUS | Status: DC | PRN
Start: 2021-08-10 — End: 2021-08-11

## 2021-08-10 MED ORDER — ORAL CARE MOUTH RINSE: 15.0000 mL | Freq: Once | OROMUCOSAL | Status: AC

## 2021-08-10 MED ORDER — SODIUM CHLORIDE 0.9 % IV SOLN: 250.0000 mL | INTRAVENOUS | Status: DC

## 2021-08-10 MED ORDER — IODIXANOL 320 MG/ML IV SOLN
INTRAVENOUS | Status: DC | PRN
  Administered 2021-08-10: 60 mL via INTRA_ARTERIAL

## 2021-08-10 MED ORDER — 0.9 % SODIUM CHLORIDE (POUR BTL) OPTIME
TOPICAL | Status: DC | PRN
Start: 2021-08-10 — End: 2021-08-10
  Administered 2021-08-10: 1000 mL

## 2021-08-10 MED ORDER — CEFAZOLIN SODIUM-DEXTROSE 2-4 GM/100ML-% IV SOLN
2.0000 g | Freq: Three times a day (TID) | INTRAVENOUS | Status: AC
  Administered 2021-08-10 – 2021-08-11 (×2): 2 g via INTRAVENOUS
  Filled 2021-08-10 (×2): qty 100

## 2021-08-10 MED ORDER — RA FISH OIL 1000 MG PO CAPS: 2000.0000 mg | ORAL_CAPSULE | Freq: Two times a day (BID) | ORAL | Status: DC

## 2021-08-10 MED ORDER — PROPOFOL 10 MG/ML IV BOLUS
INTRAVENOUS | Status: DC | PRN
  Administered 2021-08-10: 80 mg via INTRAVENOUS

## 2021-08-10 MED ORDER — NITROGLYCERIN IN D5W 200-5 MCG/ML-% IV SOLN
0.0000 ug/min | INTRAVENOUS | Status: DC
  Administered 2021-08-10: 5 ug/min via INTRAVENOUS
  Filled 2021-08-10: qty 250

## 2021-08-10 MED ORDER — FINASTERIDE 5 MG PO TABS
5.0000 mg | ORAL_TABLET | Freq: Every day | ORAL | Status: DC
  Administered 2021-08-11 – 2021-08-13 (×3): 5 mg via ORAL
  Filled 2021-08-10 (×3): qty 1

## 2021-08-10 MED ORDER — MORPHINE SULFATE (PF) 2 MG/ML IV SOLN
1.0000 mg | INTRAVENOUS | Status: DC | PRN
  Administered 2021-08-10 (×6): 4 mg via INTRAVENOUS
  Administered 2021-08-11: 2 mg via INTRAVENOUS
  Filled 2021-08-10 (×3): qty 2
  Filled 2021-08-10: qty 1
  Filled 2021-08-10 (×3): qty 2

## 2021-08-10 MED ORDER — CHLORHEXIDINE GLUCONATE 0.12 % MT SOLN: 15.0000 mL | Freq: Once | OROMUCOSAL | Status: DC

## 2021-08-10 MED ORDER — SODIUM CHLORIDE 0.9 % IV SOLN: 250.0000 mL | INTRAVENOUS | Status: DC | PRN

## 2021-08-10 MED ORDER — SODIUM CHLORIDE 0.9 % IV SOLN: INTRAVENOUS | Status: DC

## 2021-08-10 MED ORDER — CLOPIDOGREL BISULFATE 75 MG PO TABS
75.0000 mg | ORAL_TABLET | Freq: Every day | ORAL | Status: DC
  Administered 2021-08-11 – 2021-08-13 (×3): 75 mg via ORAL
  Filled 2021-08-10 (×3): qty 1

## 2021-08-10 MED ORDER — ATORVASTATIN CALCIUM 80 MG PO TABS
80.0000 mg | ORAL_TABLET | Freq: Every day | ORAL | Status: DC
  Administered 2021-08-11 – 2021-08-13 (×3): 80 mg via ORAL
  Filled 2021-08-10 (×2): qty 1

## 2021-08-10 MED ORDER — SODIUM CHLORIDE 0.9 % IV SOLN: INTRAVENOUS | Status: AC

## 2021-08-10 MED ORDER — CHLORHEXIDINE GLUCONATE CLOTH 2 % EX PADS
6.0000 | MEDICATED_PAD | Freq: Every day | CUTANEOUS | Status: DC
  Administered 2021-08-11 – 2021-08-13 (×3): 6 via TOPICAL

## 2021-08-10 MED ORDER — OXYCODONE HCL 5 MG PO TABS
5.0000 mg | ORAL_TABLET | ORAL | Status: DC | PRN
  Administered 2021-08-10 – 2021-08-12 (×3): 10 mg via ORAL
  Filled 2021-08-10 (×3): qty 2

## 2021-08-10 MED ORDER — CLEVIDIPINE BUTYRATE 0.5 MG/ML IV EMUL
INTRAVENOUS | Status: DC | PRN
Start: 2021-08-10 — End: 2021-08-10
  Administered 2021-08-10: 2 mg/h via INTRAVENOUS

## 2021-08-10 MED ORDER — CLEVIDIPINE BUTYRATE 0.5 MG/ML IV EMUL
0.0000 mg/h | INTRAVENOUS | Status: DC
  Administered 2021-08-10 – 2021-08-11 (×2): 1 mg/h via INTRAVENOUS
  Filled 2021-08-10 (×2): qty 50

## 2021-08-10 MED ORDER — ROCURONIUM BROMIDE 10 MG/ML (PF) SYRINGE
PREFILLED_SYRINGE | INTRAVENOUS | Status: DC | PRN
  Administered 2021-08-10: 30 mg via INTRAVENOUS
  Administered 2021-08-10: 50 mg via INTRAVENOUS
  Administered 2021-08-10: 20 mg via INTRAVENOUS

## 2021-08-10 MED ORDER — TRAMADOL HCL 50 MG PO TABS: 50.0000 mg | ORAL_TABLET | ORAL | Status: DC | PRN

## 2021-08-10 MED ORDER — ROCURONIUM BROMIDE 10 MG/ML (PF) SYRINGE
PREFILLED_SYRINGE | INTRAVENOUS | Status: AC
  Filled 2021-08-10: qty 10

## 2021-08-10 MED ORDER — ACETAMINOPHEN 325 MG PO TABS
650.0000 mg | ORAL_TABLET | Freq: Four times a day (QID) | ORAL | Status: DC | PRN
Start: 2021-08-10 — End: 2021-08-11
  Administered 2021-08-10 – 2021-08-11 (×2): 650 mg via ORAL
  Filled 2021-08-10 (×2): qty 2

## 2021-08-10 MED ORDER — ASPIRIN 81 MG PO CHEW
81.0000 mg | CHEWABLE_TABLET | Freq: Every day | ORAL | Status: DC
  Administered 2021-08-11 – 2021-08-13 (×3): 81 mg via ORAL
  Filled 2021-08-10 (×3): qty 1

## 2021-08-10 MED ORDER — FENTANYL CITRATE (PF) 250 MCG/5ML IJ SOLN
INTRAMUSCULAR | Status: DC | PRN
  Administered 2021-08-10: 50 ug via INTRAVENOUS
  Administered 2021-08-10: 25 ug via INTRAVENOUS
  Administered 2021-08-10: 50 ug via INTRAVENOUS
  Administered 2021-08-10: 25 ug via INTRAVENOUS
  Administered 2021-08-10 (×2): 50 ug via INTRAVENOUS

## 2021-08-10 MED ORDER — CHLORHEXIDINE GLUCONATE 4 % EX LIQD: 30.0000 mL | CUTANEOUS | Status: DC

## 2021-08-10 MED ORDER — ONDANSETRON HCL 4 MG/2ML IJ SOLN
INTRAMUSCULAR | Status: DC | PRN
  Administered 2021-08-10: 4 mg via INTRAVENOUS

## 2021-08-10 MED ORDER — SODIUM CHLORIDE 0.9% FLUSH
3.0000 mL | Freq: Two times a day (BID) | INTRAVENOUS | Status: DC
  Administered 2021-08-10 – 2021-08-11 (×2): 3 mL via INTRAVENOUS

## 2021-08-10 MED ORDER — CHLORHEXIDINE GLUCONATE 0.12 % MT SOLN
15.0000 mL | Freq: Once | OROMUCOSAL | Status: AC
  Administered 2021-08-10: 15 mL via OROMUCOSAL
  Filled 2021-08-10: qty 15

## 2021-08-10 MED ORDER — EPINEPHRINE PF 1 MG/ML IJ SOLN
INTRAMUSCULAR | Status: AC
  Filled 2021-08-10: qty 1

## 2021-08-10 MED ORDER — ONDANSETRON HCL 4 MG/2ML IJ SOLN
4.0000 mg | Freq: Four times a day (QID) | INTRAMUSCULAR | Status: DC | PRN
  Administered 2021-08-10 – 2021-08-11 (×2): 4 mg via INTRAVENOUS
  Filled 2021-08-10 (×2): qty 2

## 2021-08-10 MED ORDER — SUGAMMADEX SODIUM 200 MG/2ML IV SOLN
INTRAVENOUS | Status: DC | PRN
  Administered 2021-08-10: 200 mg via INTRAVENOUS

## 2021-08-10 SURGICAL SUPPLY — 105 items
ADAPTER UNIV SWAN GANZ BIP (ADAPTER) ×1 IMPLANT
ADAPTER UNV SWAN GANZ BIP (ADAPTER) ×2
ANTEGRADE CPLG (MISCELLANEOUS) IMPLANT
BAG DECANTER FOR FLEXI CONT (MISCELLANEOUS) ×2 IMPLANT
BAG SNAP BAND KOVER 36X36 (MISCELLANEOUS) ×4 IMPLANT
BLADE CLIPPER SURG (BLADE) ×4 IMPLANT
BLADE OSCILLATING /SAGITTAL (BLADE) IMPLANT
CABLE ADAPT CONN TEMP 6FT (ADAPTER) ×2 IMPLANT
CANNULA FEM VENOUS REMOTE 22FR (CANNULA) IMPLANT
CANNULA OPTISITE PERFUSION 16F (CANNULA) IMPLANT
CANNULA OPTISITE PERFUSION 18F (CANNULA) IMPLANT
CANNULA SUMP PERICARDIAL (CANNULA) ×2 IMPLANT
CATH CROSS OVER TEMPO 5F (CATHETERS) ×2 IMPLANT
CATH DIAG EXPO 6F VENT PIG 145 (CATHETERS) ×2 IMPLANT
CATH FOLEY 16FR TEMP PROBE (CATHETERS) ×1 IMPLANT
CATH S G BIP PACING (CATHETERS) ×2 IMPLANT
CLIP VESOCCLUDE MED 6/CT (CLIP) ×2 IMPLANT
CLIP VESOCCLUDE SM WIDE 6/CT (CLIP) ×2 IMPLANT
CLOSURE MYNX CONTROL 6F/7F (Vascular Products) ×1 IMPLANT
CNTNR URN SCR LID CUP LEK RST (MISCELLANEOUS) ×1 IMPLANT
CONN ST 1/4X3/8  BEN (MISCELLANEOUS) ×4
CONN ST 1/4X3/8 BEN (MISCELLANEOUS) ×1 IMPLANT
CONT SPEC 4OZ STRL OR WHT (MISCELLANEOUS) ×2
COVER DOME SNAP 22 D (MISCELLANEOUS) ×2 IMPLANT
DERMABOND ADVANCED (GAUZE/BANDAGES/DRESSINGS) ×1
DERMABOND ADVANCED .7 DNX12 (GAUZE/BANDAGES/DRESSINGS) ×1 IMPLANT
DRAIN CHANNEL 28F RND 3/8 FF (WOUND CARE) ×3 IMPLANT
DRAPE INCISE IOBAN 66X45 STRL (DRAPES) IMPLANT
DRAPE SURG IRRIG POUCH 19X23 (DRAPES) ×5 IMPLANT
DRAPE WARM FLUID 44X44 (DRAPES) ×1 IMPLANT
ELECT BLADE 6.5 EXT (BLADE) ×1 IMPLANT
ELECT CAUTERY BLADE 6.4 (BLADE) ×2 IMPLANT
ELECT REM PT RETURN 9FT ADLT (ELECTROSURGICAL) ×4
ELECTRODE REM PT RTRN 9FT ADLT (ELECTROSURGICAL) ×2 IMPLANT
FELT TEFLON 1X6 (MISCELLANEOUS) ×1 IMPLANT
FELT TEFLON 6X6 (MISCELLANEOUS) ×2 IMPLANT
GAUZE 4X4 16PLY ~~LOC~~+RFID DBL (SPONGE) ×3 IMPLANT
GAUZE SPONGE 4X4 12PLY STRL (GAUZE/BANDAGES/DRESSINGS) ×2 IMPLANT
GLOVE SURG ENC MOIS LTX SZ7.5 (GLOVE) ×4 IMPLANT
GLOVE SURG ENC MOIS LTX SZ8 (GLOVE) ×4 IMPLANT
GLOVE SURG MICRO LTX SZ7 (GLOVE) ×4 IMPLANT
GLOVE SURG ORTHO LTX SZ7.5 (GLOVE) ×4 IMPLANT
GOWN STRL REUS W/ TWL LRG LVL3 (GOWN DISPOSABLE) ×3 IMPLANT
GOWN STRL REUS W/ TWL XL LVL3 (GOWN DISPOSABLE) ×6 IMPLANT
GOWN STRL REUS W/TWL LRG LVL3 (GOWN DISPOSABLE) ×6
GOWN STRL REUS W/TWL XL LVL3 (GOWN DISPOSABLE) ×12
HEMOSTAT POWDER SURGIFOAM 1G (HEMOSTASIS) IMPLANT
INSERT FOGARTY SM (MISCELLANEOUS) ×2 IMPLANT
KIT DILATOR VASC 18G NDL (KITS) IMPLANT
KIT HEART LEFT (KITS) ×2 IMPLANT
KIT SUCTION CATH 14FR (SUCTIONS) ×6 IMPLANT
KIT TURNOVER KIT B (KITS) ×2 IMPLANT
LEAD PACING MYOCARDI (MISCELLANEOUS) ×2 IMPLANT
LOOP VESSEL MAXI BLUE (MISCELLANEOUS) IMPLANT
LOOP VESSEL MINI RED (MISCELLANEOUS) IMPLANT
NDL PERC 18GX7CM (NEEDLE) ×1 IMPLANT
NEEDLE 22X1 1/2 (OR ONLY) (NEEDLE) ×2 IMPLANT
NEEDLE PERC 18GX7CM (NEEDLE) ×4 IMPLANT
NS IRRIG 1000ML POUR BTL (IV SOLUTION) ×10 IMPLANT
PACK ENDOVASCULAR (PACKS) ×2 IMPLANT
PAD ARMBOARD 7.5X6 YLW CONV (MISCELLANEOUS) ×4 IMPLANT
PAD ELECT DEFIB RADIOL ZOLL (MISCELLANEOUS) ×2 IMPLANT
PENCIL BUTTON HOLSTER BLD 10FT (ELECTRODE) ×2 IMPLANT
POSITIONER HEAD DONUT 9IN (MISCELLANEOUS) ×2 IMPLANT
RETRACTOR TRL SOFT TISSUE LG (INSTRUMENTS) IMPLANT
RETRACTOR TRM SOFT TISSUE 7.5 (INSTRUMENTS) IMPLANT
SHEATH BRITE TIP 7FR 35CM (SHEATH) ×2 IMPLANT
SHEATH PINNACLE 6F 10CM (SHEATH) ×4 IMPLANT
SHEATH PINNACLE 8F 10CM (SHEATH) ×2 IMPLANT
SLEEVE REPOSITIONING LENGTH 30 (MISCELLANEOUS) ×2 IMPLANT
SPONGE T-LAP 18X18 ~~LOC~~+RFID (SPONGE) ×10 IMPLANT
SPONGE T-LAP 4X18 ~~LOC~~+RFID (SPONGE) ×4 IMPLANT
STOPCOCK MORSE 400PSI 3WAY (MISCELLANEOUS) ×4 IMPLANT
SUT BONE WAX W31G (SUTURE) ×2 IMPLANT
SUT ETHIBOND X763 2 0 SH 1 (SUTURE) ×4 IMPLANT
SUT PROLENE 2 0 MH 48 (SUTURE) ×9 IMPLANT
SUT PROLENE 4 0 RB 1 (SUTURE) ×2
SUT PROLENE 4-0 RB1 .5 CRCL 36 (SUTURE) ×1 IMPLANT
SUT SILK  1 MH (SUTURE) ×4
SUT SILK 1 MH (SUTURE) ×1 IMPLANT
SUT SILK 2 0 SH CR/8 (SUTURE) ×2 IMPLANT
SUT VIC AB 1 CTX 18 (SUTURE) ×1 IMPLANT
SUT VIC AB 1 CTX 36 (SUTURE) ×2
SUT VIC AB 1 CTX36XBRD ANBCTR (SUTURE) ×1 IMPLANT
SUT VIC AB 2-0 CT1 27 (SUTURE) ×2
SUT VIC AB 2-0 CT1 27XBRD (SUTURE) IMPLANT
SUT VIC AB 2-0 CTX 36 (SUTURE) ×2 IMPLANT
SUT VIC AB 3-0 X1 27 (SUTURE) ×3 IMPLANT
SUT VICRYL 2 TP 1 (SUTURE) IMPLANT
SYR 3ML LL SCALE MARK (SYRINGE) ×2 IMPLANT
SYR 50ML LL SCALE MARK (SYRINGE) ×2 IMPLANT
SYR 5ML LL (SYRINGE) ×2 IMPLANT
SYR BULB IRRIG 60ML STRL (SYRINGE) ×1 IMPLANT
SYSTEM SAHARA CHEST DRAIN ATS (WOUND CARE) ×2 IMPLANT
TOWEL GREEN STERILE (TOWEL DISPOSABLE) ×2 IMPLANT
TOWEL GREEN STERILE FF (TOWEL DISPOSABLE) ×2 IMPLANT
TOWEL OR NON WOVEN STRL DISP B (DISPOSABLE) ×2 IMPLANT
TRANSDUCER W/STOPCOCK (MISCELLANEOUS) ×4 IMPLANT
TRAY FOLEY SLVR 16FR TEMP STAT (SET/KITS/TRAYS/PACK) IMPLANT
TUBING ART PRESS 72  MALE/FEM (TUBING) ×2
TUBING ART PRESS 72 MALE/FEM (TUBING) ×1 IMPLANT
VALVE HEART TRANSCATH SZ3 29MM (Valve) ×1 IMPLANT
WIRE EMERALD 3MM-J .035X150CM (WIRE) ×2 IMPLANT
WIRE EMERALD 3MM-J .035X260CM (WIRE) ×2 IMPLANT
YANKAUER SUCT BULB TIP NO VENT (SUCTIONS) ×2 IMPLANT

## 2021-08-10 NOTE — Anesthesia Preprocedure Evaluation (Addendum)
Anesthesia Evaluation  ?Patient identified by MRN, date of birth, ID band ?Patient awake ? ? ? ?Reviewed: ?Allergy & Precautions, NPO status , Patient's Chart, lab work & pertinent test results ? ?History of Anesthesia Complications ?(+) PONV and history of anesthetic complications ? ?Airway ?Mallampati: I ? ?TM Distance: >3 FB ?Neck ROM: Full ? ? ? Dental ? ?(+) Edentulous Upper, Edentulous Lower ?  ?Pulmonary ?sleep apnea , COPD, former smoker,  ?  ?breath sounds clear to auscultation ? ? ? ? ? ? Cardiovascular ?hypertension, Pt. on home beta blockers ?+ CAD, + CABG and +CHF  ? ?Rhythm:Regular Rate:Normal ?+ Systolic murmurs ?Echo: ?1. Inferolateral hypokinesis.. Left ventricular ejection fraction, by  ?estimation, is 45%%. The left ventricular internal cavity size was  ?moderately dilated. There is mild left ventricular hypertrophy. Left  ?ventricular diastolic parameters are consistent  ??with Grade II diastolic dysfunction (pseudonormalization). Elevated left  ?atrial pressure.  ??2. Right ventricular systolic function is normal. The right ventricular  ?size is normal. There is moderately elevated pulmonary artery systolic  ?pressure.  ??3. Left atrial size was moderately dilated.  ??4. Right atrial size was mildly dilated.  ??5. Mild to moderate mitral valve regurgitation.  ??6. AV is thickened, calcified with restricted motion. Peak and mean  ?gradients through the valve are 41 and 24 mm Hg respectively. AVA (VTI) is  ?0.92 cm2 Dimensionless index is 0.24 consistent with severe AS. Note SVI  ?is 77, lower end of normal.. Aortic  ?valve regurgitation is mild to moderate.  ??7. The inferior vena cava is normal in size with greater than 50%  ?respiratory variability, suggesting right atrial pressure of 3 mmHg.  ?  ?Neuro/Psych ?Anxiety CVA   ? GI/Hepatic ?Neg liver ROS, hiatal hernia, GERD  Medicated,  ?Endo/Other  ?Hypothyroidism  ? Renal/GU ?ESRF and DialysisRenal disease  ? ?   ?Musculoskeletal ? ?(+) Arthritis ,  ? Abdominal ?Normal abdominal exam  (+)   ?Peds ? Hematology ?  ?Anesthesia Other Findings ? ? Reproductive/Obstetrics ? ?  ? ? ? ? ? ? ? ? ? ? ? ? ? ?  ?  ? ? ? ? ? ? ? ?Anesthesia Physical ?Anesthesia Plan ? ?ASA: 4 ? ?Anesthesia Plan: General  ? ?Post-op Pain Management:   ? ?Induction: Intravenous ? ?PONV Risk Score and Plan: 4 or greater and Ondansetron and Treatment may vary due to age or medical condition ? ?Airway Management Planned: Oral ETT ? ?Additional Equipment: Arterial line, CVP and Ultrasound Guidance Line Placement ? ?Intra-op Plan:  ? ?Post-operative Plan: Extubation in OR ? ?Informed Consent: I have reviewed the patients History and Physical, chart, labs and discussed the procedure including the risks, benefits and alternatives for the proposed anesthesia with the patient or authorized representative who has indicated his/her understanding and acceptance.  ? ? ? ?Dental advisory given ? ?Plan Discussed with: CRNA ? ?Anesthesia Plan Comments:   ? ? ? ? ? ?Anesthesia Quick Evaluation ? ?

## 2021-08-10 NOTE — Interval H&P Note (Signed)
History and Physical Interval Note: ? ?08/10/2021 ?9:48 AM ? ?TANIS BURNLEY  has presented today for surgery, with the diagnosis of Severe Aortic Stenosis.  The various methods of treatment have been discussed with the patient and family. After consideration of risks, benefits and other options for treatment, the patient has consented to  Procedure(s): ?Transcatheter Aortic Valve Replacement-Transapical (N/A) ?TRANSESOPHAGEAL ECHOCARDIOGRAM (TEE) (N/A) as a surgical intervention.  The patient's history has been reviewed, patient examined, no change in status, stable for surgery.  I have reviewed the patient's chart and labs.  Questions were answered to the patient's satisfaction.   ? ? ?[name] ? ? ?

## 2021-08-10 NOTE — Consult Note (Signed)
Reason for Consult: To manage dialysis and dialysis related needs ?Referring Physician: Cyndia Bent ? ?BUEFORD ARP is an 76 y.o. male with past medical history significant for HTN, COPD, OSA, CAD s/p CABG, ESRD -  HD [facility] MWF.  He also has severe AS ad presents here today for TAVR.  He is now s/p procedure.  I am asked to provide his routine HD.  He did go to his OP unit yesterday.  He is seen postop -  in pain.  He tells me he does well with HD -  has been on dialysis for about a year  ? ? ?Dialyzes at [facility] MWF 4 hours  EDW 79.5. ?HD [place] 2/2.5, Dialyzer unknown, Heparin no. Access  AVF-  right-  15 gauge needles 400 BFR ?Venofer weekly, mircera 200 q 2 weeks and calcitriol 0.5 per treatment ? ?Past Medical History:  ?Diagnosis Date  ? Acute myocardial infarction of other inferior wall, initial episode of care   ? Anemia   ? hx of bld transfusion  ? Anxiety   ? Arthritis   ? Cancer [facility])   ? bladder 2013 removed, has cystoscopy 10/2016, has returned x 2  ? Chronic kidney disease   ? dialysis - M-W-F at [facility] in [place]  ? COPD (chronic obstructive pulmonary disease) ([place])   ? patient is not aware of this dx  ? Coronary artery disease   ? Artery bypass graft Jan 2008  ? Dialysis patient [facility])   ? Dyspnea   ? with exertion  ? GERD (gastroesophageal reflux disease)   ? Hearing loss   ? wears hearing aids  ? History of blood transfusion 2017  ? History of hiatal hernia   ? Hyperlipidemia   ? Hypertension   ? Hypothyroidism   ? PONV (postoperative nausea and vomiting)   ? only with TURP surgery, no other problems with the other surgeries  ? 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 w/ S3UR 21m TA approach with Dr. MAngelena Formand Dr. BCyndia Bent ? Severe aortic stenosis   ? Sleep apnea   ? uses CPAP nightly  ? Stroke ([facility]   ? Tobacco user   ? quit 09/2016  ? ? ?Past Surgical History:  ?Procedure Laterality Date  ? APPENDECTOMY    ? AV FISTULA PLACEMENT Right 02/11/2020  ? Procedure: RIGHT ARM  BRACHIOCEPHALIC ARTERIOVENOUS (AV) FISTULA;  Surgeon: DAngelia Mould MD;  [place]  Service: Vascular;  Laterality: Right;  ? CARDIAC CATHETERIZATION  05/28/2021  ? COLONOSCOPY WITH PROPOFOL N/A 09/02/2020  ? Procedure: COLONOSCOPY WITH PROPOFOL;  Surgeon: RRogene Houston MD;  [facility];  Service: Endoscopy;  Laterality: N/A;  ? CORONARY ARTERY BYPASS GRAFT  06/14/2006  ? 5 blockages  ? CORONARY ARTERY BYPASS GRAFT N/A 09/19/2016  ? Procedure: REDO CORONARY ARTERY BYPASS GRAFTING (CABG) x two, using right internal mammary artery and left radial artery;  Surgeon: BGaye Pollack MD;  [place];  Service: Open Heart Surgery;  Laterality: N/A;  ? FOOT SURGERY Right   ? HEMOSTASIS CLIP PLACEMENT  09/02/2020  ? Procedure: HEMOSTASIS CLIP PLACEMENT;  Surgeon: RRogene Houston MD;  [facility];  Service: Endoscopy;;  ? HERNIA REPAIR    ? x 3 surgeries  ? HOT HEMOSTASIS  09/02/2020  ? Procedure: HOT HEMOSTASIS (ARGON PLASMA COAGULATION/BICAP);  Surgeon: RRogene Houston MD;  [facility];  Service: Endoscopy;;  ? LEFT HEART CATH AND  CORS/GRAFTS ANGIOGRAPHY N/A 09/14/2016  ? Procedure: Left Heart Cath and Cors/Grafts Angiography;  Surgeon: Troy Sine, MD;  Location: [place] CV LAB;  Service: Cardiovascular;  Laterality: N/A;  ? LEFT HEART CATHETERIZATION WITH CORONARY/GRAFT ANGIOGRAM N/A 01/31/2014  ? Procedure: LEFT HEART CATHETERIZATION WITH Beatrix Fetters;  Surgeon: Sanda Klein, MD;  Location: [facility] C[facility] LAB;  Service: Cardiovascular;  Laterality: N/A;  ? PERCUTANEOUS CORONARY STENT INTERVENTION (PCI-S)  01/31/2014  ? Procedure: PERCUTANEOUS CORONARY STENT INTERVENTION (PCI-S);  Surgeon: Sanda Klein, MD;  Location: [facility] CATH LAB;  Service: Cardiovascular;;  ? POLYPECTOMY  09/02/2020  ? Procedure: POLYPECTOMY;  Surgeon: Rogene Houston, MD;  [facility];  Service: Endoscopy;;  ? RADIAL ARTERY HARVEST Left 09/19/2016  ?  Procedure: RADIAL ARTERY HARVEST;  Surgeon: [name], MD;  Location: [place];  Service: Open Heart Surgery;  Laterality: Left;  ? RIGHT/LEFT HEART CATH AND CORONARY/GRAFT ANGIOGRAPHY N/A 05/28/2021  ? Procedure: RIGHT/LEFT HEART CATH AND CORONARY/GRAFT ANGIOGRAPHY;  Surgeon: Burnell Blanks, MD;  Location: [place] CV LAB;  Service: Cardiovascular;  Laterality: N/A;  ? TEE WITHOUT CARDIOVERSION N/A 09/19/2016  ? Procedure: TRANSESOPHAGEAL ECHOCARDIOGRAM (TEE);  Surgeon: [name], MD;  Location: [place];  Service: Open Heart Surgery;  Laterality: N/A;  ? TRANSURETHRAL RESECTION BLADDER TUMOR and bilateral RPG's  10/07/2020  ? [facility]  ? ? ?Family History  ?Problem Relation Age of Onset  ? Heart attack Mother   ? CVA Mother   ? CVA Father   ? Heart attack Brother   ? Colon cancer Neg Hx   ? Colon polyps Neg Hx   ? ? ?Social History:  reports that he quit smoking about 4 years ago. His smoking use included cigarettes. He started smoking about 62 years ago. He has a 40.00 pack-year smoking history. He has never used smokeless tobacco. He reports that he does not drink alcohol and does not use drugs. ? ?Allergies:  ?Allergies  ?Allergen Reactions  ? Cardizem Cd [Diltiazem Hcl Er Beads] Palpitations  ?  "Makes heart skip"  ? ? ?Medications: I have reviewed the patient's current medications. ? ?Results for orders placed or performed during the hospital encounter of 08/10/21 (from the past 48 hour(s))  ?I-STAT, chem 8     Status: Abnormal  ? Collection Time: 08/10/21  9:07 AM  ?Result Value Ref Range  ? Sodium 138 135 - 145 mmol/L  ? Potassium 4.1 3.5 - 5.1 mmol/L  ? Chloride 101 98 - 111 mmol/L  ? BUN 39 (H) 8 - 23 mg/dL  ? Creatinine, Ser 5.60 (H) 0.61 - 1.24 mg/dL  ? Glucose, Bld 83 70 - 99 mg/dL  ?  Comment: Glucose reference range applies only to samples taken after fasting for at least 8 hours.  ? Calcium, Ion 0.98 (L) 1.15 - 1.40 mmol/L  ? TCO2 26 22 - 32 mmol/L  ? Hemoglobin 11.2 (L) 13.0 -  17.0 g/dL  ? HCT 33.0 (L) 39.0 - 52.0 %  ? ? ?VAS [place] GROIN PSEUDOANEURYSM ? ?Result Date: 08/10/2021 ? ARTERIAL PSEUDOANEURYSM  Patient Name:  [name]  Date of Exam:   08/10/2021 Medical Rec #: [id number]       Accession #:    [id number] Date of Birth: [birth year]       Patient Gender: M Patient Age:   73 years Exam Location:  [facility] Procedure:      VAS [place] GROIN PSEUDOANEURYSM Referring Phys: Angelena Form --------------------------------------------------------------------------------  Exam: Right groin Indications: Patient complains of History of recent right CFA pseudoaneurysm. Comparison Study: 06/22/2021- right CFA pseudoaneurysm Performing Technologist: Maudry Mayhew MHA, RDMS, RVT, RDCS  Examination Guidelines: A complete evaluation includes B-mode imaging, spectral Doppler, color Doppler, and power Doppler as needed of all accessible portions of each vessel. Bilateral testing is considered an integral part of a complete examination. Limited examinations for reoccurring indications may be performed as noted. +------------+----------+--------+------+----------+ Right DuplexPSV (cm/s)WaveformPlaqueComment(s) +------------+----------+--------+------+----------+ Ext.Iliac      144    biphasic                 +------------+----------+--------+------+----------+ CFA            156    biphasic                 +------------+----------+--------+------+----------+ PFA            125    biphasic                 +------------+----------+--------+------+----------+ Prox SFA       181    biphasic                 +------------+----------+--------+------+----------+ Right Vein comments:CFV patent  Summary: No evidence of pseudoaneurysm, AVF or DVT  Diagnosing physician: Deitra Mayo MD Electronically signed by Deitra Mayo MD on 08/10/2021 at 2:54:41 PM.   --------------------------------------------------------------------------------    Final   ? ?Structural  Heart Procedure ? ?Result Date: 08/10/2021 ?See surgical note for result.  ? ?ROS: in pain from procedure ?Blood pressure (!) 157/56, pulse 66, temperature 98.3 ?F (36.8 ?C), temperature source Oral, resp. rate 18, height '5\' 9"'$  (1.753 m), wei

## 2021-08-10 NOTE — Progress Notes (Signed)
?  [facility]   ?MULTIDISCIPLINARY HEART VALVE TEAM ? ? ?Nephrology contacted for HD while inpatient s/p TAVR today, 08/10/21.  ? ? ?Kathyrn Drown NP-C ?Structural Heart Team  ?Pager: [phone] ?Phone: [phone] ? ?

## 2021-08-10 NOTE — Anesthesia Procedure Notes (Signed)
Central Venous Catheter Insertion ?Performed by: [name], MD, anesthesiologist ?Start/End3/28/2023 10:20 AM, 08/10/2021 10:30 AM ?Patient location: Pre-op. ?Preanesthetic checklist: patient identified, IV checked, site marked, risks and benefits discussed, surgical consent, monitors and equipment checked, pre-op evaluation, timeout performed and anesthesia consent ?Position: Trendelenburg ?Lidocaine 1% used for infiltration and patient sedated ?Hand hygiene performed , maximum sterile barriers used  and Seldinger technique used ?Catheter size: 8 Fr ?Total catheter length 16. ?Central line was placed.Double lumen ?Procedure performed using ultrasound guided technique. ?Ultrasound Notes:anatomy identified, needle tip was noted to be adjacent to the nerve/plexus identified, no ultrasound evidence of intravascular and/or intraneural injection and image(s) printed for medical record ?Attempts: 1 ?Following insertion, dressing applied, line sutured and Biopatch. ?Post procedure assessment: blood return through all ports ? ?Patient tolerated the procedure well with no immediate complications. ? ? ? ? ?

## 2021-08-10 NOTE — Progress Notes (Signed)
Autogenerated IV team request for US guided IV for vasopressors noted.  Patient has central line that will be used for this purpose if needed and okay to discontinue USGPIV request per Nadara Eaton, RN. ?

## 2021-08-10 NOTE — Progress Notes (Signed)
Right groin pseudoaneurysm duplex completed. ?Refer to "CV Proc" under chart review to view preliminary results. ? ?08/10/2021 9:33 AM ?Kelby Aline., MHA, RVT, RDCS, RDMS   ?

## 2021-08-10 NOTE — CV Procedure (Signed)
?HEART AND VASCULAR CENTER  ?TAVR OPERATIVE NOTE ? ? ?Date of Procedure:  08/10/2021 ? ?Preoperative Diagnosis: Severe Aortic Stenosis  ? ?Postoperative Diagnosis: Same  ? ?Procedure:  ? ?Transcatheter Aortic Valve Replacement - TransApical Approach ? Edwards Sapien 3 THV (size 29 mm, model # B6411258, serial # [id number]) ?  ?Co-Surgeons:  Lauree Chandler, MD and [name], MD  ? ?Anesthesiologist:  Smith Robert ? ?Echocardiographer:  O'Neal ? ?Pre-operative Echo Findings: ?Severe aortic stenosis ?Mild left ventricular systolic dysfunction ? ?Post-operative Echo Findings: ?No paravalvular leak ?Mild left ventricular systolic dysfunction ? ?BRIEF CLINICAL NOTE AND INDICATIONS FOR SURGERY ? ?76 yo male with history of PAD, CAD s/p CABG in 2008, anemia, anxiety, arthritis, bladder cancer, BPH, former tobacco abuse, COPD, ESRD on HD, GERD, hyperlipidemia, HTN, hypothyroidism, sleep apnea, prior stroke and severe aortic stenosis who is here today for TAVR. He was admitted to [facility] January 2023 with a NSTEMI. Echo 05/24/21 with LVEF=45%, mild to moderate MR. Severe aortic low flow/low gradient aortic stenosis with mean gradient 24 mmHg, peak gradient 41 mmHg, AVA 0.9 cm2, DI 0.24, SVI 38. He had CABG in 2008 and redo CABG in 2018. Cardiac cath 05/28/21 with severe three vessel CAD. Patent free RIMA graft to the Diagonal, patent right radial artery graft to the posterolateral artery, patent LIMA to LAD. Moderate ostial Circumflex stenosis. The Circumflex is not grafted. No culprit lesion noted. CT scans with no good options for access from the femoral arteries, carotid arteries or subclavian arteries. Gated cardiac CT with anatomy suitable for a 29 mm Edwards Sapien 3 valve. He continues to have dyspnea on exertion. ? ?During the course of the patient's preoperative work up they have been evaluated comprehensively by a multidisciplinary team of specialists coordinated through the [facility] in  the [place] [facility].  They have been demonstrated to suffer from symptomatic severe aortic stenosis as noted above. The patient has been counseled extensively as to the relative risks and benefits of all options for the treatment of severe aortic stenosis including long term medical therapy, conventional surgery for aortic valve replacement, and transcatheter aortic valve replacement.  The patient has been independently evaluated by Dr. Cyndia Bent with CT surgery and they are felt to be at high risk for conventional surgical aortic valve replacement. The surgeon indicated the patient would be a poor candidate for conventional surgery. Based upon review of all of the patient's preoperative diagnostic tests they are felt to be candidate for transcatheter aortic valve replacement using the transapical approach as an alternative to high risk conventional surgery.   ? ?Following the decision to proceed with transcatheter aortic valve replacement, a discussion has been held regarding what types of management strategies would be attempted intraoperatively in the event of life-threatening complications, including whether or not the patient would be considered a candidate for the use of cardiopulmonary bypass and/or conversion to open sternotomy for attempted surgical intervention.  The patient has been advised of a variety of complications that might develop peculiar to this approach including but not limited to risks of death, stroke, paravalvular leak, aortic dissection or other major vascular complications, aortic annulus rupture, device embolization, cardiac rupture or perforation, acute myocardial infarction, arrhythmia, heart block or bradycardia requiring permanent pacemaker placement, congestive heart failure, respiratory failure, renal failure, pneumonia, infection, other late complications related to structural valve deterioration or migration, or other complications that might ultimately cause a  temporary or permanent loss of functional independence or other long  term morbidity.  The patient provides full informed consent for the procedure as described and all questions were answered preoperatively. ? ? ? ?DETAILS OF THE OPERATIVE PROCEDURE ? ?PREPARATION:   ?The patient is brought to the operating room on the above mentioned date. The patient is placed in the supine position on the operating table.  Intravenous antibiotics are administered. General anesthesia is used.  ? ?Baseline transesophageal echocardiogram was performed. The patient's chest, abdomen, both groins, and both lower extremities are prepared and draped in a sterile manner. A time out procedure is performed. ? ? ?PERIPHERAL ACCESS:   ?Using the modified Seldinger technique, femoral arterial and venous access were obtained with placement of a 6 Fr sheath in the artery and a 7 Fr sheath in the vein on the right side using u/s guidance.  A pigtail diagnostic catheter was passed through the femoral arterial sheath under fluoroscopic guidance into the aortic root.  A temporary transvenous pacemaker catheter was passed through the femoral venous sheath under fluoroscopic guidance into the right ventricle.  The pacemaker was tested to ensure stable lead placement and pacemaker capture. Aortic root angiography was performed in order to determine the optimal angiographic angle for valve deployment. ? ?TRANSAPICAL ACCESS: ?Please see Dr. Earlene Plater full operative note for details of surgical access.  ? ?The patient was heparinized systemically and ACT verified > 250 seconds.  ? ?The left ventricular apex is exposed. A needle is used to access the left ventricle through the apex.  A J wire is directed through the needle and out of the LV through the aortic valve into the ascending aorta. A JR4 catheter is then advanced into the aorta and the J wire is exchanged for an Amplatz Extra Stiff wife. A 21 Fr Certitude sheath was introduced into the apex.    ? ?TRANSCATHETER HEART VALVE DEPLOYMENT:  ?An Edwards Sapien 3 THV (size 29 mm) was prepared and crimped per manufacturer's guidelines, and the proper orientation of the valve is confirmed on the [place] Certitude delivery system. The valve was advanced through the introducer sheath using normal technique until the valve is in an appropriate position in the annulus. Once final position of the valve has been confirmed by angiographic assessment, the valve is deployed while temporarily holding ventilation and during rapid ventricular pacing to maintain systolic blood pressure < 50 mmHg and pulse pressure < 10 mmHg. The balloon inflation is held for >3 seconds after reaching full deployment volume. Once the balloon has fully deflated the delivery system is removed. There is felt to be no paravalvular leak and no central aortic insufficiency.  The patient's hemodynamic recovery following valve deployment is good.  The deployment balloon and guidewire are both removed. Echo demostrated acceptable post-procedural gradients, stable mitral valve function, and no AI.  ? ?PROCEDURE COMPLETION:  ?The sheath was then removed and Dr. Cyndia Bent closed the apical defect. Please see.  ?Dr. Earlene Plater operative note for full details. Protamine was administered. The temporary pacemaker, pigtail catheter and femoral sheaths were removed with a Mynx closure device placed in the artery and manual pressure used for venous hemostasis.   ? ?The patient tolerated the procedure well and is transported to the surgical intensive care in stable condition. There were no immediate intraoperative complications. All sponge instrument and needle counts are verified correct at completion of the operation.  ? ?No blood products were administered during the operation. ? ?The patient received a total of 60 mL of intravenous contrast during the procedure. ? ?Harrell Gave  Angelena Form MD ?08/10/2021 ?2:05 PM ? ? ? ? ?  ?

## 2021-08-10 NOTE — Plan of Care (Signed)
Pt resting comfortably in bed with eyes closed

## 2021-08-10 NOTE — Anesthesia Postprocedure Evaluation (Signed)
Anesthesia Post Note ? ?Patient: [name] ? ?Procedure(s) Performed: Transcatheter Aortic Valve Replacement-Transapical (Chest) ?TRANSESOPHAGEAL ECHOCARDIOGRAM (TEE) (Chest) ? ?  ? ?Patient location during evaluation: SICU ?Anesthesia Type: General ?Level of consciousness: awake ?Pain management: pain level controlled ?Vital Signs Assessment: post-procedure vital signs reviewed and stable ?Respiratory status: spontaneous breathing, nonlabored ventilation, respiratory function stable and patient connected to nasal cannula oxygen ?Cardiovascular status: blood pressure returned to baseline and stable ?Postop Assessment: no apparent nausea or vomiting ?Anesthetic complications: no ? ? ?No notable events documented. ? ?Last Vitals:  ?Vitals:  ? 08/10/21 1450 08/10/21 1500  ?BP: (!) 140/56 (!) 151/52  ?Pulse: 67 70  ?Resp: (!) 22 20  ?Temp:    ?SpO2: 95% 95%  ?  ?Last Pain:  ?Vitals:  ? 08/10/21 1450  ?TempSrc:   ?PainSc: 6   ? ? ?  ?  ?  ?  ?  ?  ? ?[name] ? ? ? ? ?

## 2021-08-10 NOTE — Op Note (Signed)
CARDIOTHORACIC SURGERY OPERATIVE NOTE ? ?Date of Procedure:               07/29/2014 ? ?Preoperative Diagnosis:      Severe Aortic Stenosis  ? ?Postoperative Diagnosis:    Same   ? ?Procedure:      ? ?Transcatheter Aortic Valve Replacement - Transapical using an Edwards Sapien 3 THV (size 29 mm, model # 9600TFX, serial # [id number]) ?             ?Co-Surgeons:                        [name], MD and Lauree Chandler, MD ? ? ?Anesthesiologist:                 Suella Broad, MD ? ?Echocardiographer:             Marry Guan, MD ? ?Pre-operative Echo Findings: ?Severe aortic stenosis ?Moderate left ventricular systolic dysfunction ? ?Post-operative Echo Findings: ?No paravalvular leak ?Moderate left ventricular systolic dysfunction ? ? ? ?BRIEF CLINICAL NOTE AND INDICATIONS FOR SURGERY ? ?This 76 year old gentleman has stage D 2, severe, symptomatic, low-flow/low gradient aortic valve stenosis with NYHA class III symptoms of exertional fatigue and shortness of breath consistent with chronic diastolic congestive heart failure.  He had a recent admission for chest discomfort and ruled in for NSTEMI with no culprit seen on catheterization and it was felt that this was likely due to his severe aortic stenosis.  I have personally reviewed his 2D echocardiogram, cardiac catheterization, and CTA studies.  His aortic valve is calcified and thickened with restricted leaflet mobility.  The mean gradient is 24 mmHg with a valve area of 0.92 cm? and dimensionless index of 0.24 consistent with severe aortic stenosis.  Stroke-volume index is low at 38.  There is mild to moderate aortic insufficiency.  There is mild to moderate mitral regurgitation.  Left ventricular ejection fraction is mildly reduced at 45% with grade 2 diastolic dysfunction and mild LV dilation at 5.7 cm during diastole.  Cardiac catheterization showed severe native three-vessel coronary disease with 3 patent arterial bypasses and no culprit lesion for his  NSTEMI.  The mean gradient across the aortic valve at catheterization was 11 mmHg with a peak to peak gradient of 14 mmHg.  I think the appearance of his aortic valve on echocardiogram looks like severe aortic stenosis and there is probably moderate aortic insufficiency.  AI pressure half-time is 230 ms.  I think this combination is likely responsible for his symptoms.  I agree that aortic valve replacement is indicated for relief of his symptoms and to prevent further left ventricular dysfunction.  His gated cardiac CTA shows anatomy suitable for TAVR using a 29 mm SAPIEN 3 valve.  Unfortunately he has severe diffuse calcific atherosclerosis that would preclude using either femoral artery, either carotid artery, and either subclavian artery for insertion.  I think the only option available would be to perform transapical insertion which would have increased risk given his 2 prior cardiac surgeries.  I think this is still the best option for him. ?  ?The patient and his wife were counseled at length regarding treatment alternatives for management of severe symptomatic aortic stenosis. The risks and benefits of surgical intervention has been discussed in detail. Long-term prognosis with medical therapy was discussed. Alternative approaches such as conventional surgical aortic valve replacement, transcatheter aortic valve replacement, and palliative medical therapy were compared and contrasted at length.  This discussion was placed in the context of the patient's own specific clinical presentation and past medical history. All of their questions have been addressed.  ?  ?Following the decision to proceed with transcatheter aortic valve replacement, a discussion was held regarding what types of management strategies would be attempted intraoperatively in the event of life-threatening complications, including whether or not the patient would be considered a candidate for the use of cardiopulmonary bypass and/or  conversion to open sternotomy for attempted surgical intervention.  I do not think the patient is a candidate for emergent sternotomy to manage any intraoperative complications given his 2 prior cardiac surgeries.  The patient is aware of the fact that transient use of cardiopulmonary bypass may be necessary. The patient has been advised of a variety of complications that might develop including but not limited to risks of death, stroke, paravalvular leak, aortic dissection or other major vascular complications, aortic annulus rupture, device embolization, cardiac rupture or perforation, mitral regurgitation, acute myocardial infarction, arrhythmia, heart block or bradycardia requiring permanent pacemaker placement, congestive heart failure, respiratory failure, renal failure, pneumonia, infection, other late complications related to structural valve deterioration or migration, or other complications that might ultimately cause a temporary or permanent loss of functional independence or other long term morbidity. The patient provides full informed consent for the procedure as described and all questions were answered.  ? ?  ? ?DETAILS OF THE OPERATIVE PROCEDURE ? ?PREPARATION:   ? ?The patient is brought to the operating room on the above mentioned date and central monitoring was established by the anesthesia team including placement of a radial arterial line. The patient is placed in the supine position on the operating table.  Intravenous antibiotics are administered. General endotracheal anesthesia is induced uneventfully.  ? ?Baseline transesophageal echocardiogram was performed.  Findings were notable for severe aortic stenosis and moderate LV systolic dysfunction. ? ?The patient's chest, abdomen, and both groins are prepared and draped in a sterile manner. A time out procedure is performed. ? ? ?PERIPHERAL ACCESS:   ? ?Right femoral arterial and venous access is obtained with placement of 6 Fr arterial  sheaths.  A pigtail diagnostic catheter is passed through the  arterial sheath under fluoroscopic guidance into the aortic root.  A temporary transvenous pacemaker catheter is passed through the  femoral venous sheath under fluoroscopic guidance into the right ventricle.  The pacemaker is tested to ensure stable lead placement and pacemaker capture. ? ? ?TRANSAPICAL ACCESS:  ? ?A small left anterolateral thoracotomy is performed at the inferior border of the pectoralis major muscle. Fluoroscopy was used to estimate the position of the apex and the appropriate interspace was entered. The mini thoracotomy retractor was placed. The heart was present immediately below the incision. The anterior apex was located with finger pressure while observing TEE. A double concentric pursestring suture of pledgetted 3-0 Prolene suture was placed around the anterior apex.  ?The patient is heparinized systemically and ACT verified > 250 seconds.  The apex is punctured using an  18 gauge needle and a soft J-tipped guidewire is passed into the LV and advanced through the aortic valve and into the aortic arch.  A 6 Fr sheath is placed over the guidewire.  A MPA-1 catheter is passed through the sheath and advanced around into the descending aorta.  An Amplatz extra stiff guidewire is passed through the MPA-1 catheter into the descending aorta and both the MPA-1 catheter and the introducing sheath are removed.  An Edwards Certitude  introducer sheath is passed over the guidewire into the LV for 4 cm.   The sheath position is continuously monitored and secured by the Co-surgeon.  ? ? ?BALLOON AORTIC VALVULOPLASTY: ? ?Not performed ? ? ?TRANSCATHETER HEART VALVE DEPLOYMENT: ? ?An Edwards Sapien THV (size 29 mm) is prepared and crimped per manufacturer's guidelines, and the proper orientation of the valve is confirmed on the Martin Certitude delivery system.  The delivery system loader is advanced into the introducing sheath.  The valve and  delivery system are advanced through the loader into the sheath and all air is evacuated.  The valve and balloon are advanced part way across the aortic valve.  The valve is then finely positioned in the aortic valve .

## 2021-08-10 NOTE — Transfer of Care (Signed)
Immediate Anesthesia Transfer of Care Note ? ?Patient: [name] ? ?Procedure(s) Performed: Transcatheter Aortic Valve Replacement-Transapical (Chest) ?TRANSESOPHAGEAL ECHOCARDIOGRAM (TEE) (Chest) ? ?Patient Location: ICU ? ?Anesthesia Type:General ? ?Level of Consciousness: awake, alert  and patient cooperative ? ?Airway & Oxygen Therapy: Patient Spontanous Breathing and Patient connected to face mask oxygen ? ?Post-op Assessment: Report given to RN, Post -op Vital signs reviewed and stable and Patient moving all extremities ? ?Post vital signs: Reviewed and stable ? ?Last Vitals:  ?Vitals Value Taken Time  ?BP 133/53 08/10/21 1433  ?Temp    ?Pulse 70 08/10/21 1442  ?Resp 17 08/10/21 1442  ?SpO2 93 % 08/10/21 1442  ?Vitals shown include unvalidated device data. ? ?Last Pain:  ?Vitals:  ? 08/10/21 0929  ?TempSrc:   ?PainSc: 0-No pain  ?   ? ?  ? ?Complications: No notable events documented. ?

## 2021-08-10 NOTE — Progress Notes (Signed)
Patient ID: [name], male   DOB: [birth year], 76 y.o.   MRN: [id number] ?TCTS: ? ?Hemodynamically stable on Cleviprex for HTN.  ?Rhythm is sinus 70. ? ?Awake and alert. Complains of chest wall pain.  ?Dressing dry ?Groin site ok ? ?Chest tube output low. ?CXR: mild atelectasis and pulmonary vascular congestion. ?

## 2021-08-10 NOTE — Assessment & Plan Note (Signed)
S3UR 79m via TA approach with Dr. MAngelena Formand Dr. BCyndia Bent?

## 2021-08-10 NOTE — Anesthesia Procedure Notes (Signed)
Procedure Name: Intubation ?Date/Time: 08/10/2021 11:45 AM ?Performed by: Amadeo Garnet, CRNA ?Pre-anesthesia Checklist: Patient identified, Emergency Drugs available, Suction available and Patient being monitored ?Patient Re-evaluated:Patient Re-evaluated prior to induction ?Oxygen Delivery Method: Circle system utilized ?Preoxygenation: Pre-oxygenation with 100% oxygen ?Induction Type: IV induction ?Ventilation: Mask ventilation without difficulty ?Laryngoscope Size: Mac and 3 ?Grade View: Grade I ?Tube type: Oral ?Tube size: 7.5 mm ?Number of attempts: 1 ?Airway Equipment and Method: Stylet and Oral airway ?Placement Confirmation: ETT inserted through vocal cords under direct vision, positive ETCO2 and breath sounds checked- equal and bilateral ?Secured at: 22 cm ?Tube secured with: Tape ?Dental Injury: Teeth and Oropharynx as per pre-operative assessment  ? ? ? ? ?

## 2021-08-11 ENCOUNTER — Encounter (HOSPITAL_COMMUNITY): Payer: Self-pay | Admitting: Cardiovascular Disease

## 2021-08-11 ENCOUNTER — Inpatient Hospital Stay (HOSPITAL_COMMUNITY): Payer: No Typology Code available for payment source

## 2021-08-11 DIAGNOSIS — Z952 Presence of prosthetic heart valve: Secondary | ICD-10-CM

## 2021-08-11 DIAGNOSIS — Z954 Presence of other heart-valve replacement: Secondary | ICD-10-CM

## 2021-08-11 DIAGNOSIS — I350 Nonrheumatic aortic (valve) stenosis: Secondary | ICD-10-CM

## 2021-08-11 LAB — BASIC METABOLIC PANEL
Anion gap: 14 (ref 5–15)
BUN: 40 mg/dL — ABNORMAL HIGH (ref 8–23)
CO2: 23 mmol/L (ref 22–32)
Calcium: 7.9 mg/dL — ABNORMAL LOW (ref 8.9–10.3)
Chloride: 99 mmol/L (ref 98–111)
Creatinine, Ser: 5.45 mg/dL — ABNORMAL HIGH (ref 0.61–1.24)
GFR, Estimated: 10 mL/min — ABNORMAL LOW (ref >60)
Glucose, Bld: 145 mg/dL — ABNORMAL HIGH (ref 70–99)
Potassium: 4.2 mmol/L (ref 3.5–5.1)
Sodium: 136 mmol/L (ref 135–145)

## 2021-08-11 LAB — CBC
HCT: 30.8 % — ABNORMAL LOW (ref 39.0–52.0)
Hemoglobin: 9.3 g/dL — ABNORMAL LOW (ref 13.0–17.0)
MCH: 27.7 pg (ref 26.0–34.0)
MCHC: 30.2 g/dL (ref 30.0–36.0)
MCV: 91.7 fL (ref 80.0–100.0)
Platelets: 148 10*3/uL — ABNORMAL LOW (ref 150–400)
RBC: 3.36 MIL/uL — ABNORMAL LOW (ref 4.22–5.81)
RDW: 17.5 % — ABNORMAL HIGH (ref 11.5–15.5)
WBC: 5.7 10*3/uL (ref 4.0–10.5)
nRBC: 0 % (ref 0.0–0.2)

## 2021-08-11 LAB — ECHOCARDIOGRAM COMPLETE
AR max vel: 2.46 cm2
AV Area VTI: 2.46 cm2
AV Area mean vel: 2.31 cm2
AV Mean grad: 5.7 mmHg
AV Peak grad: 9.4 mmHg
Ao pk vel: 1.53 m/s
Height: 69 in
S' Lateral: 4.4 cm
Weight: 2850.11 oz

## 2021-08-11 LAB — MAGNESIUM: Magnesium: 1.5 mg/dL — ABNORMAL LOW (ref 1.7–2.4)

## 2021-08-11 LAB — HEPATITIS B SURFACE ANTIBODY,QUALITATIVE: Hep B S Ab: NONREACTIVE

## 2021-08-11 MED ORDER — ISOSORBIDE MONONITRATE ER 60 MG PO TB24
120.0000 mg | ORAL_TABLET | Freq: Every day | ORAL | Status: DC
  Administered 2021-08-11 – 2021-08-13 (×3): 120 mg via ORAL
  Filled 2021-08-11 (×3): qty 2

## 2021-08-11 MED ORDER — PENTAFLUOROPROP-TETRAFLUOROETH EX AERO: 1.0000 "application " | INHALATION_SPRAY | CUTANEOUS | Status: DC | PRN

## 2021-08-11 MED ORDER — SODIUM CHLORIDE 0.9 % IV SOLN
100.0000 mL | INTRAVENOUS | Status: DC | PRN
  Administered 2021-08-12 (×2): 100 mL via INTRAVENOUS

## 2021-08-11 MED ORDER — ALTEPLASE 2 MG IJ SOLR: 2.0000 mg | Freq: Once | INTRAMUSCULAR | Status: DC | PRN

## 2021-08-11 MED ORDER — CALCITRIOL 0.25 MCG PO CAPS: 0.5000 ug | ORAL_CAPSULE | ORAL | Status: DC

## 2021-08-11 MED ORDER — CARVEDILOL 6.25 MG PO TABS
6.2500 mg | ORAL_TABLET | Freq: Two times a day (BID) | ORAL | Status: DC
  Administered 2021-08-11 – 2021-08-12 (×2): 6.25 mg via ORAL
  Filled 2021-08-11 (×2): qty 1

## 2021-08-11 MED ORDER — PERFLUTREN LIPID MICROSPHERE
1.0000 mL | INTRAVENOUS | Status: AC | PRN
  Administered 2021-08-11: 3 mL via INTRAVENOUS
  Filled 2021-08-11: qty 10

## 2021-08-11 MED ORDER — HYDRALAZINE HCL 20 MG/ML IJ SOLN
10.0000 mg | Freq: Four times a day (QID) | INTRAMUSCULAR | Status: DC | PRN
Start: 2021-08-11 — End: 2021-08-13
  Administered 2021-08-12: 10 mg via INTRAVENOUS
  Filled 2021-08-11 (×2): qty 1

## 2021-08-11 MED ORDER — SODIUM CHLORIDE 0.9 % IV SOLN: 250.0000 mL | INTRAVENOUS | Status: DC | PRN

## 2021-08-11 MED ORDER — HEPARIN SODIUM (PORCINE) 1000 UNIT/ML DIALYSIS
1000.0000 [IU] | INTRAMUSCULAR | Status: DC | PRN
  Filled 2021-08-11: qty 1

## 2021-08-11 MED ORDER — LIDOCAINE HCL (PF) 1 % IJ SOLN: 5.0000 mL | INTRAMUSCULAR | Status: DC | PRN

## 2021-08-11 MED ORDER — LIDOCAINE-PRILOCAINE 2.5-2.5 % EX CREA: 1.0000 "application " | TOPICAL_CREAM | CUTANEOUS | Status: DC | PRN

## 2021-08-11 MED ORDER — SODIUM CHLORIDE 0.9 % IV SOLN: 100.0000 mL | INTRAVENOUS | Status: DC | PRN

## 2021-08-11 MED ORDER — SODIUM CHLORIDE 0.9 % IV SOLN
12.5000 mg | Freq: Once | INTRAVENOUS | Status: AC
  Administered 2021-08-11: 12.5 mg via INTRAVENOUS
  Filled 2021-08-11 (×2): qty 0.5

## 2021-08-11 MED ORDER — ~~LOC~~ CARDIAC SURGERY, PATIENT & FAMILY EDUCATION: Freq: Once | Status: AC

## 2021-08-11 MED ORDER — SODIUM CHLORIDE 0.9% FLUSH: 3.0000 mL | INTRAVENOUS | Status: DC | PRN

## 2021-08-11 MED ORDER — DARBEPOETIN ALFA 100 MCG/0.5ML IJ SOSY
100.0000 ug | PREFILLED_SYRINGE | INTRAMUSCULAR | Status: DC
  Filled 2021-08-11: qty 0.5

## 2021-08-11 MED ORDER — SODIUM CHLORIDE 0.9% FLUSH
3.0000 mL | Freq: Two times a day (BID) | INTRAVENOUS | Status: DC
  Administered 2021-08-11 – 2021-08-12 (×3): 3 mL via INTRAVENOUS

## 2021-08-11 NOTE — TOC Initial Note (Addendum)
Transition of Care (TOC) - Initial/Assessment Note  ? ? ?Patient Details  ?Name: [name] ?MRN: [id number] ?Date of Birth: 20-Sep-1945 ? ?Transition of Care (TOC) CM/SW Contact:    ?Mark Collet, RN ?Phone Number: ?08/11/2021, 1:11 PM ? ?Clinical Narrative:             ?Patient from home w spouse. ?Admitted for TAVR. ?PCP- [name] per wife. ?Patient uses mail order from [place] for scripts, as well as [facility]. ?No HH needs anticipated.  ? ?[facility] notified, confirmation number: [id number] ?     ? ?[name], [name] (Spouse)  ?[phone]  ? ? ?Expected Discharge Plan: Home/Self Care ?Barriers to Discharge: Continued Medical Work up ? ? ?Patient Goals and CMS Choice ?  ?  ?  ? ?Expected Discharge Plan and Services ?Expected Discharge Plan: Home/Self Care ?  ?  ?  ?Living arrangements for the past 2 months: [place] ?                ?  ?  ?  ?  ?  ?  ?  ?  ?  ?  ? ?Prior Living Arrangements/Services ?Living arrangements for the past 2 months: [place] ?Lives with:: Spouse ?  ?       ?  ?  ?  ?  ? ?Activities of Daily Living ?Home Assistive Devices/Equipment: Blood pressure cuff, Eyeglasses, CPAP, Dentures (specify type), Grab bars in shower, Hearing aid, Scales ?ADL Screening (condition at time of admission) ?Patient's cognitive ability adequate to safely complete daily activities?: Yes ?Is the patient deaf or have difficulty hearing?: Yes ?Does the patient have difficulty seeing, even when wearing glasses/contacts?: No ?Does the patient have difficulty concentrating, remembering, or making decisions?: No ?Patient able to express need for assistance with ADLs?: Yes ?Does the patient have difficulty dressing or bathing?: No ?Independently performs ADLs?: Yes (appropriate for developmental age) ?Does the patient have difficulty walking or climbing stairs?: Yes ?Weakness of Legs: None ?Weakness of Arms/Hands: None ? ?Permission Sought/Granted ?  ?   ?   ?   ?   ?   ? ?Emotional Assessment ?  ?  ?  ?  ?  ?  ? ?Admission diagnosis:  S/P TAVR (transcatheter aortic valve replacement) [Z95.2] ?Patient Active Problem List  ? Diagnosis Date Noted  ? 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 Aortic stenosis 05/26/2021  ? Abdominal pain 04/05/2021  ? Cholelithiasis 04/05/2021  ? Hypertensive urgency 04/05/2021  ? Adenoma of right adrenal gland 04/05/2021  ? Elevated lipase 04/05/2021  ? Hypothyroidism (acquired) 04/05/2021  ? GERD (gastroesophageal reflux disease) 04/05/2021  ? History of COPD 04/05/2021  ? Malignant neoplasm of overlapping sites of bladder ([place]) 09/07/2020  ? End-stage renal disease on hemodialysis ([place]) 09/02/2020  ? Rectal bleeding   ? Acute diastolic CHF (congestive heart failure) ([place]) 05/09/2020  ? Acute exacerbation of Diastolic CHF (congestive heart failure) ([place]) 05/08/2020  ? Symptomatic bradycardia with heart failure 05/08/2020  ? COPD without exacerbation (HCC)---40 Pack Years 05/08/2020  ? Acute respiratory failure with hypoxia ([place]) 05/08/2020  ? Uremia in the setting of AKI on CKD V 02/07/2020  ? CKD (chronic kidney disease), stage V ([place]) 02/07/2020  ? Coronary artery disease involving native coronary artery of native heart without angina pectoris 12/16/2019  ? 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 Gastroenteritis 12/16/2019  ? History of bladder cancer 12/16/2019  ? Hyperkalemia 12/16/2019  ?  Renal lesion 05/16/2019  ? Proteinuria 03/28/2019  ? Acute blood loss anemia 06/08/2018  ? Anemia in chronic kidney disease 06/08/2018  ? BPH (benign prostatic hyperplasia) 06/08/2018  ? Coronary artery disease with unstable angina pectoris ([place]) 06/08/2018  ? Elevated troponin 06/08/2018  ? GIB (gastrointestinal bleeding) 06/07/2018  ? (HFpEF) heart failure with preserved ejection fraction ([place]) 06/30/2017  ? Coronary artery disease involving autologous artery coronary bypass graft 06/29/2017  ? 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 NSTEMI (non-ST elevated myocardial infarction) ([place]) 09/14/2016  ? Chest pain 09/14/2016  ? Obstructive sleep apnea 09/14/2016  ? Acute myocardial infarction of other inferior wall, initial episode of care   ? Unstable angina ([place]) 01/30/2014  ? OVERWEIGHT/OBESITY 02/02/2010  ? HYPERLIPIDEMIA, MIXED 01/22/2009  ? OTHER CHEST PAIN 01/22/2009  ? TOBACCO USER 01/17/2009  ? Essential hypertension 01/17/2009  ? ?PCP:  [facility] ?Pharmacy:   ?[id number] [place] [place] [facility] [address]?Phone: [phone]: [phone]? [id number]?Phone: [phone]: [phone]? ?[facility]?Phone: [phone]: [phone] ? ? ? ? ?Social Determinants of Health (SDOH) Interventions ?  ? ?Readmission Risk Interventions ? ?  04/07/2021  ? 12:43 PM 08/09/2020  ?  2:05 PM  ?Readmission Risk Prevention Plan  ?Transportation Screening Complete Complete  ?Medication Review (Press photographer Complete Complete  ?[place] Home Care Consult Complete Complete  ?SW Recovery Care/Counseling Consult Complete Complete  ?Palliative Care Screening Not Applicable Not Applicable  ?[place]. Francis[place] Applicable Not Applicable  ? ? ? ?

## 2021-08-11 NOTE — Progress Notes (Signed)
CARDIAC REHAB PHASE I  ? ?PRE:  Rate/Rhythm: 80 SR bigeminy PVCs, burst of ? afib ? ?  BP: sitting 149/95 ? ?  SaO2: 93 1L ? ?MODE:  Ambulation: 190 ft  ? ?POST:  Rate/Rhythm: 86 SR with PVCs ? ?  BP: sitting 175/83  ? ?  SaO2: 84 1L, 91 1L ? ?Pt on EOB, just finished lunch, in bigeminy. Took O2 off and pt had run of what looks like Afib on review. Resolved after a few seconds, pt asx.  Stood and slowly walked unit with min assist. C/o weak legs but tolerated fair. Fatigue toward end and saO2 down to 84 1L upon return to room (had been 90-91 1L during most of walk). To recliner. No afib, PVC less during walk. Encouraged IS.  ?[phone] ? ?Yves Dill CES, ACSM ?08/11/2021 ?1:06 PM ? ? ? ? ?

## 2021-08-11 NOTE — Progress Notes (Signed)
1 Day Post-Op Procedure(s) (LRB): ?Transcatheter Aortic Valve Replacement-Transapical (N/A) ?TRANSESOPHAGEAL ECHOCARDIOGRAM (TEE) (N/A) ?Subjective: ?Some soreness from incision but better. ?Had N/V overnight but feels better this am and was able to eat some breakfast. ? ?Objective: ?Vital signs in last 24 hours: ?Temp:  [97.4 ?F (36.3 ?C)-98.3 ?F (36.8 ?C)] 97.4 ?F (36.3 ?C) (03/29 0400) ?Pulse Rate:  [36-106] 75 (03/29 0700) ?Cardiac Rhythm: Normal sinus rhythm (03/29 0400) ?Resp:  [11-27] 16 (03/29 0700) ?BP: (105-179)/(38-107) 133/105 (03/29 0700) ?SpO2:  [87 %-99 %] 92 % (03/29 0700) ?Weight:  [79.4 kg-80.8 kg] 80.8 kg (03/29 0337) ? ?Hemodynamic parameters for last 24 hours: ?  ? ?Intake/Output from previous day: ?03/28 0701 - 03/29 0700 ?In: 1947.4 [P.O.:480; I.V.:1217.5; IV Piggyback:249.9] ?Out: 1070 [Urine:480; Blood:50; Chest Tube:540] ?Intake/Output this shift: ?No intake/output data recorded. ? ?General appearance: alert and cooperative ?Neurologic: intact ?Heart: regular rate and rhythm, S1, S2 normal, no murmur, click, rub or gallop ?Lungs: clear to auscultation bilaterally ?Extremities: extremities normal, atraumatic, no cyanosis or edema ?Wound: left chest incision ok ?Right groin site ok.  ?Chest tube output low, serosanguinous. ? ?Lab Results: ?Recent Labs  ?  08/10/21 ?1607 08/11/21 ?2423  ?WBC  --  5.7  ?HGB 11.6* 9.3*  ?HCT 34.0* 30.8*  ?PLT  --  148*  ? ?BMET:  ?Recent Labs  ?  08/10/21 ?1607 08/11/21 ?5361  ?NA 138 136  ?K 3.8 4.2  ?CL 101 99  ?CO2  --  23  ?GLUCOSE 127* 145*  ?BUN 36* 40*  ?CREATININE 5.00* 5.45*  ?CALCIUM  --  7.9*  ?  ?PT/INR: No results for input(s): LABPROT, INR in the last 72 hours. ?ABG ?   ?Component Value Date/Time  ? PHART 7.49 (H) 08/06/2021 1456  ? HCO3 29.7 (H) 08/06/2021 1456  ? TCO2 24 08/10/2021 1607  ? ACIDBASEDEF 1.0 09/19/2016 2235  ? O2SAT 99.3 08/06/2021 1456  ? ?CBG (last 3)  ?No results for input(s): GLUCAP in the last 72 hours. ? ?CXR: clear. ? ?ECG:  sinus, no acute changes ? ? ?Assessment/Plan: ?S/P Procedure(s) (LRB): ?Transcatheter Aortic Valve Replacement-Transapical (N/A) ?TRANSESOPHAGEAL ECHOCARDIOGRAM (TEE) (N/A) ? ?POD 1 ?Hemodynamically stable but still on low dose Cleviprex for HTN. Will resume Coreg and Imdur and stop Cleviprex. ? ?ESRD: anticipate HD today.  ? ?DC chest tube, foley. Will keep central line in today. ? ?Transfer to 4E. Ambulate. IS,  ? ?2D echo today. ? ?CXR in am. ? ? LOS: 1 day  ? ? ?[name] ?08/11/2021 ? ? ?

## 2021-08-11 NOTE — Progress Notes (Signed)
Pt arrived from [place], VSS, CHG complete, on bedside monitor, oriented to unit, call light within reach.  ? ?Alvis Lemmings, RN ?08/11/2021 ?3:56 PM  ?

## 2021-08-11 NOTE — Progress Notes (Signed)
Subjective:  did well overnight-  sitting in chair-  on clevidipine overnight-  to be started on PO BP meds this AM to try and stop the drip ? ?Objective ?Vital signs in last 24 hours: ?Vitals:  ? 08/11/21 0611 08/11/21 0615 08/11/21 0630 08/11/21 0700  ?BP:  (!) 143/45  (!) 133/105  ?Pulse: (!) 41 (!) 42 (!) 36 75  ?Resp: '11 11 11 16  '$ ?Temp:      ?TempSrc:      ?SpO2: 98% 98% 98% 92%  ?Weight:      ?Height:      ? ?Weight change:  ? ?Intake/Output Summary (Last 24 hours) at 08/11/2021 0858 ?Last data filed at 08/11/2021 0700 ?Gross per 24 hour  ?Intake 1947.36 ml  ?Output 1070 ml  ?Net 877.36 ml  ? ? ?Dialyzes at [facility] MWF 4 hours  EDW 79.5. ?HD Bath 2/2.5, Dialyzer unknown, Heparin no. Access  AVF-  right-  15 gauge needles 400 BFR ?Venofer weekly, mircera 200 q 2 weeks and calcitriol 0.5 per treatment ? ? ?Assessment/Plan: 76 year old WM-  many medical issues including ESRD-  admitted for TAVR ?1 s/p TAVR-  per CTS-  seems to be going according to routine per nursing - POD #1 ?2 ESRD: normally MWF via AVF.  Will plan for HD today -  he wants to do in the chair-  also says that if he is to go home on Friday he would prefer to do HD at his OP center on Saturday  ?3 Hypertension: not ordered any BP meds here- possibly on coreg as OP-  not much volume on.  High BP overnight -  on clevidipine-  looking to resume coreg and imdur to wean off-  HD should help as well  ?4. Anemia of ESRD: hgb 9.9-  may go down-  will continue ESA-  got iron yest per OP unit ?5. Metabolic Bone Disease: will continue calcitriol -  no binder on med list ?  ? ? ?[name]  ? ? ?Labs: ?Basic Metabolic Panel: ?Recent Labs  ?Lab 08/06/21 ?1435 08/10/21 ?0907 08/10/21 ?1349 08/10/21 ?1607 08/11/21 ?3557  ?NA 137   < > 138 138 136  ?K 3.7   < > 3.7 3.8 4.2  ?CL 103   < > 101 101 99  ?CO2 25  --   --   --  23  ?GLUCOSE 109*   < > 106* 127* 145*  ?BUN 15   < > 33* 36* 40*  ?CREATININE 3.59*   < > 5.60* 5.00* 5.45*  ?CALCIUM 8.3*  --    --   --  7.9*  ? < > = values in this interval not displayed.  ? ?Liver Function Tests: ?Recent Labs  ?Lab 08/06/21 ?1435  ?AST 17  ?ALT 15  ?ALKPHOS 89  ?BILITOT 0.6  ?PROT 6.6  ?ALBUMIN 2.9*  ? ?No results for input(s): LIPASE, AMYLASE in the last 168 hours. ?No results for input(s): AMMONIA in the last 168 hours. ?CBC: ?Recent Labs  ?Lab 08/06/21 ?1435 08/10/21 ?0907 08/10/21 ?1349 08/10/21 ?1607 08/11/21 ?3220  ?WBC 3.4*  --   --   --  5.7  ?HGB 9.7*   < > 9.9* 11.6* 9.3*  ?HCT 30.8*   < > 29.0* 34.0* 30.8*  ?MCV 90.1  --   --   --  91.7  ?PLT 188  --   --   --  148*  ? < > = values in this  interval not displayed.  ? ?Cardiac Enzymes: ?No results for input(s): CKTOTAL, CKMB, CKMBINDEX, TROPONINI in the last 168 hours. ?CBG: ?No results for input(s): GLUCAP in the last 168 hours. ? ?Iron Studies: No results for input(s): IRON, TIBC, TRANSFERRIN, FERRITIN in the last 72 hours. ?Studies/Results: ?DG CHEST PORT 1 VIEW ? ?Result Date: 08/11/2021 ?CLINICAL DATA:  Status post thoracotomy. EXAM: PORTABLE CHEST 1 VIEW COMPARISON:  08/10/2021 FINDINGS: There is a left chest tube. Evidence for a small left apical pneumothorax. Low lung volumes with mildly prominent interstitial lung markings. Right jugular central line tip in the lower SVC. Heart size is slightly prominent and may be related to the low lung volumes. Post TAVR. Median sternotomy wires are again noted. IMPRESSION: 1. Small left apical pneumothorax.  Left chest tube is in place. 2. Low lung volumes with prominent interstitial lung markings that are suggestive for atelectasis or mild edema. Electronically Signed   By: Markus Daft M.D.   On: 08/11/2021 08:03  ? ?DG Chest Port 1 View ? ?Result Date: 08/10/2021 ?CLINICAL DATA:  Postop EXAM: PORTABLE CHEST 1 VIEW COMPARISON:  Chest x-ray 08/06/2021 FINDINGS: Right internal jugular line tip is near the cavoatrial junction. Interval cardiac surgical changes including aortic valve prosthesis. Median sternotomy wires.  Heart is mildly enlarged. Low lung volumes with pulmonary vascular prominence. No focal consolidation, pleural effusion or pneumothorax visualized. IMPRESSION: 1. Medical devices and postsurgical changes as described. 2. Cardiomegaly with low lung volumes and pulmonary vascular prominence. Electronically Signed   By: Ofilia Neas M.D.   On: 08/10/2021 15:41  ? ?VAS [place] GROIN PSEUDOANEURYSM ? ?Result Date: 08/10/2021 ? ARTERIAL PSEUDOANEURYSM  Patient Name:  [name]  Date of Exam:   08/10/2021 Medical Rec #: [id number]       Accession #:    [id number] Date of Birth: January 10, 1946       Patient Gender: M Patient Age:   43 years Exam Location:  [facility] Procedure:      VAS [place] GROIN PSEUDOANEURYSM Referring Phys: Angelena Form --------------------------------------------------------------------------------  Exam: Right groin Indications: Patient complains of History of recent right CFA pseudoaneurysm. Comparison Study: 06/22/2021- right CFA pseudoaneurysm Performing Technologist: Maudry Mayhew MHA, RDMS, RVT, RDCS  Examination Guidelines: A complete evaluation includes B-mode imaging, spectral Doppler, color Doppler, and power Doppler as needed of all accessible portions of each vessel. Bilateral testing is considered an integral part of a complete examination. Limited examinations for reoccurring indications may be performed as noted. +------------+----------+--------+------+----------+ Right DuplexPSV (cm/s)WaveformPlaqueComment(s) +------------+----------+--------+------+----------+ Ext.Iliac      144    biphasic                 +------------+----------+--------+------+----------+ CFA            156    biphasic                 +------------+----------+--------+------+----------+ PFA            125    biphasic                 +------------+----------+--------+------+----------+ Prox SFA       181    biphasic                  +------------+----------+--------+------+----------+ Right Vein comments:CFV patent  Summary: No evidence of pseudoaneurysm, AVF or DVT  Diagnosing physician: Deitra Mayo MD Electronically signed by Deitra Mayo MD on 08/10/2021 at 2:54:41 PM.   --------------------------------------------------------------------------------    Final   ? ?ECHO TEE ? ?  Result Date: 08/10/2021 ?   TRANSESOPHOGEAL ECHO REPORT   Patient Name:   [name] Date of Exam: 08/10/2021 Medical Rec #:  [id number]      Height:       69.0 in Accession #:    [id number]     Weight:       175.0 lb Date of Birth:  1946/02/16      BSA:          1.952 m? Patient Age:    60 years       BP:           157/56 mmHg Patient Gender: M              HR:           66 bpm. Exam Location:  Inpatient Procedure: Transesophageal Echo, 3D Echo, Color Doppler and Cardiac Doppler Indications:     Aortic Stenosis i35.0  History:         Patient has prior history of Echocardiogram examinations, most                  recent 05/24/2021. Prior CABG, COPD; Risk Factors:Hypertension,                  Dyslipidemia and Sleep Apnea.                  Aortic Valve: 29 mm valve is present in the aortic position.                  Procedure Date: 08/10/2021.  Sonographer:     Raquel Sarna Senior RDCS Referring Phys:  [place] Diagnosing Phys: Eleonore Chiquito MD  Sonographer Comments: 29m Edwards [id number] Implanted PROCEDURE: After discussion of the risks and benefits of a TEE, an informed consent was obtained from the patient. The transesophogeal probe was passed without difficulty through the esophogus of the patient. Sedation performed by different physician. The patient was monitored while under deep sedation. Image quality was excellent. The patient's vital signs; including heart rate, blood pressure, and oxygen saturation; remained stable throughout the procedure. The patient developed no complications during the procedure. IMPRESSIONS  1. TEE guided TAVR.  Trans-apical approach directly visualized. 29 mm S3 in aortic position. Vmax 1.4 m/s, MG 4.0 mmHG, EOA 2.2 cm2, 0.48. No apparent PVL after removal of wire. No immediate complications. The aortic valve has been repaired/replaced. Aortic valve regurgitation is not visualized.

## 2021-08-11 NOTE — Progress Notes (Signed)
Echocardiogram ?2D Echocardiogram has been performed. ? ?Oneal Deputy Janmarie Smoot RDCS ?08/11/2021, 10:31 AM ?

## 2021-08-12 ENCOUNTER — Inpatient Hospital Stay (HOSPITAL_COMMUNITY): Payer: No Typology Code available for payment source

## 2021-08-12 LAB — CBC
HCT: 29.6 % — ABNORMAL LOW (ref 39.0–52.0)
Hemoglobin: 9.3 g/dL — ABNORMAL LOW (ref 13.0–17.0)
MCH: 27.9 pg (ref 26.0–34.0)
MCHC: 31.4 g/dL (ref 30.0–36.0)
MCV: 88.9 fL (ref 80.0–100.0)
Platelets: 146 10*3/uL — ABNORMAL LOW (ref 150–400)
RBC: 3.33 MIL/uL — ABNORMAL LOW (ref 4.22–5.81)
RDW: 17.5 % — ABNORMAL HIGH (ref 11.5–15.5)
WBC: 6.1 10*3/uL (ref 4.0–10.5)
nRBC: 0 % (ref 0.0–0.2)

## 2021-08-12 LAB — BASIC METABOLIC PANEL
Anion gap: 9 (ref 5–15)
BUN: 20 mg/dL (ref 8–23)
CO2: 29 mmol/L (ref 22–32)
Calcium: 8 mg/dL — ABNORMAL LOW (ref 8.9–10.3)
Chloride: 99 mmol/L (ref 98–111)
Creatinine, Ser: 3.6 mg/dL — ABNORMAL HIGH (ref 0.61–1.24)
GFR, Estimated: 17 mL/min — ABNORMAL LOW (ref >60)
Glucose, Bld: 105 mg/dL — ABNORMAL HIGH (ref 70–99)
Potassium: 3.5 mmol/L (ref 3.5–5.1)
Sodium: 137 mmol/L (ref 135–145)

## 2021-08-12 LAB — HEPATITIS B SURFACE ANTIGEN: Hepatitis B Surface Ag: REACTIVE — AB

## 2021-08-12 LAB — HEPATITIS B SURFACE ANTIBODY, QUANTITATIVE: Hep B S AB Quant (Post): <3.1 m[IU]/mL — ABNORMAL LOW (ref >9.9)

## 2021-08-12 MED ORDER — SODIUM CHLORIDE 0.9 % IV BOLUS
300.0000 mL | Freq: Once | INTRAVENOUS | Status: AC
  Administered 2021-08-12: 300 mL via INTRAVENOUS

## 2021-08-12 MED ORDER — CARVEDILOL 12.5 MG PO TABS
12.5000 mg | ORAL_TABLET | Freq: Two times a day (BID) | ORAL | Status: DC
Start: 2021-08-12 — End: 2021-08-12

## 2021-08-12 MED ORDER — CARVEDILOL 6.25 MG PO TABS
6.2500 mg | ORAL_TABLET | Freq: Two times a day (BID) | ORAL | Status: DC
  Administered 2021-08-13: 6.25 mg via ORAL
  Filled 2021-08-12: qty 1

## 2021-08-12 NOTE — Progress Notes (Signed)
Patient's BP checked several times- 86/61. Bolus of 100 mL given. When rechecked BP 76/52. Patient asymptomatic.  Another bolus given and NP [facility] paged. She came to the bedside to assess patient. Another bolus ordered. Will continue to monitor.  ?[name]  ?

## 2021-08-12 NOTE — Progress Notes (Signed)
Pt for possible d/c tomorrow. Plan is for pt to receive out-pt HD at [facility] on Saturday instead of pt receiving treatment inpt on Friday. Spoke to [place], Therapist, sports at clinic this afternoon. Pt had contacted clinic himself and Wells Guiles aware of pt's plan/request. On Saturday, pt can arrive at 11:15 for 11:30 chair time. Spoke to pt via phone to make him aware of the appt times provided by clinic RN. Pt agreeable to plan. Appt added to pt's AVS as well. Will assist as needed.  ? ?Melven Sartorius ?Renal Navigator ?[phone] ?

## 2021-08-12 NOTE — Progress Notes (Signed)
Patient vaccinated with 1st dose (2nd series) of Hepatitis B vaccine on 08/04/2021 at outpatient clinic, [facility] [place], where patient receives his regular hemodialysis treatments. Hepatitis B surface antigen (HBsAG) test resulted as positive on 08/11/21 during this current admission at [place] has provided documentation of patient's Hepatitis B vaccination record as requested. False-positive, transient antigenemia is suspected. Dr. Moshe Cipro, Nephrology, aware. ?

## 2021-08-12 NOTE — Progress Notes (Signed)
2 Days Post-Op Procedure(s) (LRB): ?Transcatheter Aortic Valve Replacement-Transapical (N/A) ?TRANSESOPHAGEAL ECHOCARDIOGRAM (TEE) (N/A) ?Subjective: ?Some chest wall soreness but otherwise feels well. Finished HD at 1:30 am. Ambulated yesterday but not today yet. Still on oxygen. ? ?Objective: ?Vital signs in last 24 hours: ?Temp:  [97.6 ?F (36.4 ?C)-98.2 ?F (36.8 ?C)] 97.9 ?F (36.6 ?C) (03/30 0407) ?Pulse Rate:  [63-99] 99 (03/30 0407) ?Cardiac Rhythm: Atrial fibrillation (03/30 0747) ?Resp:  [15-25] 24 (03/30 0407) ?BP: (115-175)/(47-95) 153/62 (03/30 0407) ?SpO2:  [90 %-98 %] 95 % (03/30 0407) ?FiO2 (%):  [28 %] 28 % (03/29 1227) ?Weight:  [80.8 kg-80.9 kg] 80.9 kg (03/29 2305) ? ?Hemodynamic parameters for last 24 hours: ?  ? ?Intake/Output from previous day: ?03/29 0701 - 03/30 0700 ?In: 210.3 [P.O.:200; I.V.:10.3] ?Out: -140 [Urine:59; Chest Tube:30] ?Intake/Output this shift: ?No intake/output data recorded. ? ?General appearance: alert and cooperative ?Neurologic: intact ?Heart: regular rate and rhythm, S1, S2 normal, no murmur ?Lungs: clear to auscultation bilaterally ?Extremities: extremities normal, no edema ?Wound: chest incision ok ?Right groin site ok ? ?Lab Results: ?Recent Labs  ?  08/11/21 ?3536 08/12/21 ?0410  ?WBC 5.7 6.1  ?HGB 9.3* 9.3*  ?HCT 30.8* 29.6*  ?PLT 148* 146*  ? ?BMET:  ?Recent Labs  ?  08/11/21 ?1443 08/12/21 ?0410  ?NA 136 137  ?K 4.2 3.5  ?CL 99 99  ?CO2 23 29  ?GLUCOSE 145* 105*  ?BUN 40* 20  ?CREATININE 5.45* 3.60*  ?CALCIUM 7.9* 8.0*  ?  ?PT/INR: No results for input(s): LABPROT, INR in the last 72 hours. ?ABG ?   ?Component Value Date/Time  ? PHART 7.49 (H) 08/06/2021 1456  ? HCO3 29.7 (H) 08/06/2021 1456  ? TCO2 24 08/10/2021 1607  ? ACIDBASEDEF 1.0 09/19/2016 2235  ? O2SAT 99.3 08/06/2021 1456  ? ?CBG (last 3)  ?No results for input(s): GLUCAP in the last 72 hours. ? ?CXR: mild left base atelectasis ? ?Assessment/Plan: ?S/P Procedure(s) (LRB): ?Transcatheter Aortic Valve  Replacement-Transapical (N/A) ?TRANSESOPHAGEAL ECHOCARDIOGRAM (TEE) (N/A) ? ?POD 2 ?Hemodynamically stable. SBP 140-160's but I think that is where he lives. He is back on his preop meds. ? ?2D echo yesterday ok. Mean gradient 5.7 with no leak. EF 35-40%. ? ?Ambulate today and wean off oxygen. ? ?Plan home tomorrow with next HD in [facility] Saturday. ? ? LOS: 2 days  ? ? ?[name] ?08/12/2021 ? ? ?

## 2021-08-12 NOTE — Progress Notes (Signed)
Subjective:  dialysis last night ended up running even after I had changed to UF goal on the order -  BP is still a little high-  he feels well   ? ?Objective ?Vital signs in last 24 hours: ?Vitals:  ? 08/11/21 2305 08/12/21 0019 08/12/21 0407 08/12/21 0830  ?BP: (!) 155/62 (!) 142/63 (!) 153/62 130/78  ?Pulse: 75 82 99 (!) 102  ?Resp: (!) 22 20 (!) 24 (!) 27  ?Temp: 98.2 ?F (36.8 ?C) 98 ?F (36.7 ?C) 97.9 ?F (36.6 ?C) 98.4 ?F (36.9 ?C)  ?TempSrc: Oral Oral Oral Oral  ?SpO2:  95% 95% 97%  ?Weight: 80.9 kg     ?Height:      ? ?Weight change: 1.421 kg ? ?Intake/Output Summary (Last 24 hours) at 08/12/2021 0924 ?Last data filed at 08/12/2021 0730 ?Gross per 24 hour  ?Intake 346.54 ml  ?Output -140 ml  ?Net 486.54 ml  ? ? ?Dialyzes at [facility] MWF 4 hours  EDW 79.5. ?HD Bath 2/2.5, Dialyzer unknown, Heparin no. Access  AVF-  right-  15 gauge needles 400 BFR ?Venofer weekly, mircera 200 q 2 weeks and calcitriol 0.5 per treatment ? ? ?Assessment/Plan: 76 year old WM-  many medical issues including ESRD-  admitted for TAVR ?1 s/p TAVR-  per CTS-  seems to be going according to routine per nursing - POD #2 ?2 ESRD: normally MWF via AVF.  had  HD yesterday --  tentative plan is for him to go home on Friday and do HD at his OP center on Saturday -  he has talked to them ?3 Hypertension: back on coreg and imdur-  BP a little high still-  had some volume on but was not removed overnight  - as a result will likely be over his EDW when goes to HD on Sat-  he is aware ?4. Anemia of ESRD: hgb 9.9-----9.3continue ESA-  got iron as OP  per OP unit ?5. Metabolic Bone Disease: will continue calcitriol -  no binder on med list ?  ? ? ?[name]  ? ? ?Labs: ?Basic Metabolic Panel: ?Recent Labs  ?Lab 08/06/21 ?1435 08/10/21 ?0907 08/10/21 ?1607 08/11/21 ?8309 08/12/21 ?0410  ?NA 137   < > 138 136 137  ?K 3.7   < > 3.8 4.2 3.5  ?CL 103   < > 101 99 99  ?CO2 25  --   --  23 29  ?GLUCOSE 109*   < > 127* 145* 105*  ?BUN 15   < >  36* 40* 20  ?CREATININE 3.59*   < > 5.00* 5.45* 3.60*  ?CALCIUM 8.3*  --   --  7.9* 8.0*  ? < > = values in this interval not displayed.  ? ?Liver Function Tests: ?Recent Labs  ?Lab 08/06/21 ?1435  ?AST 17  ?ALT 15  ?ALKPHOS 89  ?BILITOT 0.6  ?PROT 6.6  ?ALBUMIN 2.9*  ? ?No results for input(s): LIPASE, AMYLASE in the last 168 hours. ?No results for input(s): AMMONIA in the last 168 hours. ?CBC: ?Recent Labs  ?Lab 08/06/21 ?1435 08/10/21 ?0907 08/10/21 ?1607 08/11/21 ?4076 08/12/21 ?0410  ?WBC 3.4*  --   --  5.7 6.1  ?HGB 9.7*   < > 11.6* 9.3* 9.3*  ?HCT 30.8*   < > 34.0* 30.8* 29.6*  ?MCV 90.1  --   --  91.7 88.9  ?PLT 188  --   --  148* 146*  ? < > = values in this interval  not displayed.  ? ?Cardiac Enzymes: ?No results for input(s): CKTOTAL, CKMB, CKMBINDEX, TROPONINI in the last 168 hours. ?CBG: ?No results for input(s): GLUCAP in the last 168 hours. ? ?Iron Studies: No results for input(s): IRON, TIBC, TRANSFERRIN, FERRITIN in the last 72 hours. ?Studies/Results: ?DG Chest 2 View ? ?Result Date: 08/12/2021 ?CLINICAL DATA:  S/P transcatheter aortic valve replacement EXAM: CHEST - 2 VIEW COMPARISON:  August 11, 2021. FINDINGS: Low lung volumes. Enlarged cardiac silhouette status post TAVR and median sternotomy. Right IJ central venous catheter with the tip projecting at the superior right atrium. No visible pneumothorax. Possible small left pleural effusion. Streaky left basilar opacities. IMPRESSION: 1. Low lung volumes with possible small left pleural effusion and overlying streaky opacities, probably atelectasis. No visible pneumothorax. 2. Cardiomegaly and TAVR. Electronically Signed   By: Margaretha Sheffield M.D.   On: 08/12/2021 08:11  ? ?DG CHEST PORT 1 VIEW ? ?Result Date: 08/11/2021 ?CLINICAL DATA:  Status post thoracotomy. EXAM: PORTABLE CHEST 1 VIEW COMPARISON:  08/10/2021 FINDINGS: There is a left chest tube. Evidence for a small left apical pneumothorax. Low lung volumes with mildly prominent  interstitial lung markings. Right jugular central line tip in the lower SVC. Heart size is slightly prominent and may be related to the low lung volumes. Post TAVR. Median sternotomy wires are again noted. IMPRESSION: 1. Small left apical pneumothorax.  Left chest tube is in place. 2. Low lung volumes with prominent interstitial lung markings that are suggestive for atelectasis or mild edema. Electronically Signed   By: Markus Daft M.D.   On: 08/11/2021 08:03  ? ?DG Chest Port 1 View ? ?Result Date: 08/10/2021 ?CLINICAL DATA:  Postop EXAM: PORTABLE CHEST 1 VIEW COMPARISON:  Chest x-ray 08/06/2021 FINDINGS: Right internal jugular line tip is near the cavoatrial junction. Interval cardiac surgical changes including aortic valve prosthesis. Median sternotomy wires. Heart is mildly enlarged. Low lung volumes with pulmonary vascular prominence. No focal consolidation, pleural effusion or pneumothorax visualized. IMPRESSION: 1. Medical devices and postsurgical changes as described. 2. Cardiomegaly with low lung volumes and pulmonary vascular prominence. Electronically Signed   By: Ofilia Neas M.D.   On: 08/10/2021 15:41  ? ?VAS [place] GROIN PSEUDOANEURYSM ? ?Result Date: 08/10/2021 ? ARTERIAL PSEUDOANEURYSM  Patient Name:  [name]  Date of Exam:   08/10/2021 Medical Rec #: [id number]       Accession #:    [id number] Date of Birth: [birth year]       Patient Gender: M Patient Age:   63 years Exam Location:  [facility] Procedure:      VAS [place] GROIN PSEUDOANEURYSM Referring Phys: Angelena Form --------------------------------------------------------------------------------  Exam: Right groin Indications: Patient complains of History of recent right CFA pseudoaneurysm. Comparison Study: 06/22/2021- right CFA pseudoaneurysm Performing Technologist: Maudry Mayhew MHA, RDMS, RVT, RDCS  Examination Guidelines: A complete evaluation includes B-mode imaging, spectral Doppler, color Doppler, and power Doppler as  needed of all accessible portions of each vessel. Bilateral testing is considered an integral part of a complete examination. Limited examinations for reoccurring indications may be performed as noted. +------------+----------+--------+------+----------+ Right DuplexPSV (cm/s)WaveformPlaqueComment(s) +------------+----------+--------+------+----------+ Ext.Iliac      144    biphasic                 +------------+----------+--------+------+----------+ CFA            156    biphasic                 +------------+----------+--------+------+----------+ PFA  125    biphasic                 +------------+----------+--------+------+----------+ Prox SFA       181    biphasic                 +------------+----------+--------+------+----------+ Right Vein comments:CFV patent  Summary: No evidence of pseudoaneurysm, AVF or DVT  Diagnosing physician: Deitra Mayo MD Electronically signed by Deitra Mayo MD on 08/10/2021 at 2:54:41 PM.   --------------------------------------------------------------------------------    Final   ? ?ECHOCARDIOGRAM COMPLETE ? ?Result Date: 08/11/2021 ?   ECHOCARDIOGRAM REPORT   Patient Name:   [name] Date of Exam: 08/11/2021 Medical Rec #:  [id number]      Height:       69.0 in Accession #:    [id number]     Weight:       178.1 lb Date of Birth:  Oct 24, 1945      BSA:          1.967 m? Patient Age:    15 years       BP:           133/70 mmHg Patient Gender: M              HR:           80 bpm. Exam Location:  Inpatient Procedure: 2D Echo, Color Doppler, Cardiac Doppler and Intracardiac            Opacification Agent Indications:    Post-TAVR Evaluation z95.2  History:        Patient has prior history of Echocardiogram examinations, most                 recent 08/10/2021. Prior CABG, COPD; Risk Factors:Hypertension,                 Dyslipidemia and Sleep Apnea. 08/10/21 Transapical AVR with 63m                 Edwards S3U Bioprosthetic.                  Aortic Valve: 29 mm Sapien prosthetic, stented (TAVR) valve is                 present in the aortic position. Procedure Date: 08/10/2021.  Sonographer:    ERaquel SarnaSenior RDCS Referring Phys: [id number] D MCDANIEL  Sonographer Comments: AMikel Cella

## 2021-08-12 NOTE — Progress Notes (Signed)
CARDIAC REHAB PHASE I  ? ?PRE:  Rate/Rhythm: 95 afib with PVCs ? ?  BP: sitting 95/79 ? ?  SaO2: 92-98 RA ? ?MODE:  Ambulation: 260 ft  ? ?POST:  Rate/Rhythm: 103 ST with PVCs ? ?  BP: sitting 126/68  ? ?  SaO2: 90 2L ? ?Pt doing well in recliner on RA. Stood with min assist and walked with RW and RA. However after 100 ft SaO2 down to 83 RA. Could only increase to 87 RA with pursed lip breathing therefore applied 2L. Maintained 90 on 2L during rest of walk. Stronger today. Has RW at home if needed. Will f/u tomorrow to qualify for O2 if still needed. ?[id number]  ? ?Yves Dill CES, ACSM ?08/12/2021 ?10:09 AM ? ? ? ? ?

## 2021-08-12 NOTE — Discharge Summary (Signed)
?HEART AND VASCULAR CENTER   ?MULTIDISCIPLINARY HEART VALVE TEAM ? ?Discharge Summary  ?  ?Patient ID: [name] ?MRN: [id number]; DOB: 1946/03/13 ? ?Admit date: 08/10/2021 ?Discharge date: 08/13/2021 ? ?Primary Care Provider: [facility], [name]  ?Primary Cardiologist: Mark Dolly, MD / Dr. Angelena Form, MD & Dr. Cyndia Bent, MD (TAVR)  ? ?Discharge Diagnoses  ?  ?Principal Problem: ?  S/P TAVR (transcatheter aortic valve replacement) ?Active Problems: ?  HYPERLIPIDEMIA, MIXED ?  Essential hypertension ?  NSTEMI (non-ST elevated myocardial infarction) ([place]) ?  Obstructive sleep apnea ?  S/P CABG x 2 ?  End-stage renal disease on hemodialysis ([place]) ?  History of COPD ? ?Allergies ?Allergies  ?Allergen Reactions  ? Cardizem Cd [Diltiazem Hcl Er Beads] Palpitations  ?  "Makes heart skip"  ? ?Diagnostic Studies/Procedures  ?  ?CARDIOTHORACIC SURGERY OPERATIVE NOTE ?  ?Date of Procedure:               07/29/2014 ?  ?Preoperative Diagnosis:      Severe Aortic Stenosis  ?  ?Postoperative Diagnosis:    Same   ?  ?Procedure:      ?  ?Transcatheter Aortic Valve Replacement - Transapical using an Edwards Sapien 3 THV (size 29 mm, model # 9600TFX, serial # [id number]) ?             ?Co-Surgeons:                        Mark Pollack, MD and Mark Chandler, MD ?  ?  ?Anesthesiologist:                 Mark Broad, MD ?  ?Echocardiographer:             Mark Guan, MD ?  ?Pre-operative Echo Findings: ?Severe aortic stenosis ?Moderate left ventricular systolic dysfunction ?  ?Post-operative Echo Findings: ?No paravalvular leak ?Moderate left ventricular systolic dysfunction ?  ?_____________ ? ?Echocardiogram 08/11/21:  ? ? 1. S/p TAVR with trans-apical access. Global hypokinesis with severe  ?hypokinesis of the apex which is expected. Left ventricular ejection  ?fraction, by estimation, is 35 to 40%. The left ventricle has moderately  ?decreased function. The left ventricle  ?demonstrates regional wall motion abnormalities  (see scoring  ?diagram/findings for description). There is moderate left ventricular  ?hypertrophy. Indeterminate diastolic filling due to E-A fusion.  ? 2. Right ventricular systolic function is moderately reduced. The right  ?ventricular size is normal. There is moderately elevated pulmonary artery  ?systolic pressure. The estimated right ventricular systolic pressure is  ?63.8 mmHg.  ? 3. The mitral valve is degenerative. Mild mitral valve regurgitation. No  ?evidence of mitral stenosis.  ? 4. 29 mm S3. Vmax 1.5 m/s, MG 5.7 mmHG, EOA 2.46 cm2, DI 0.50. No  ?regurgitation or PVL. The aortic valve has been repaired/replaced. Aortic  ?valve regurgitation is not visualized. There is a 29 mm Sapien prosthetic  ?(TAVR) valve present in the aortic  ?position. Procedure Date: 08/10/2021. Echo findings are consistent with  ?normal structure and function of the aortic valve prosthesis.  ? 5. The inferior vena cava is normal in size with greater than 50%  ?respiratory variability, suggesting right atrial pressure of 3 mmHg.  ? ?History of Present Illness   ?  ?[name] is a 76 y.o. male with a history of ESRD on HD (M,W,F), PAD, bladder cancer 2013, BPH, HTN, HLD, hypothyroid, cholelithiasis, CAD s/p CABGx4 (2008 by [name]) and redo CABG  x2 (2018 [name]) with recent NSTEMI in the setting of symptomatic AS and anemia (transfused) with no intervention who presented to [facility] on 3/38/23 for planned TAVR.  ? ?[name] has been followed by [name] for his cardiology care. He was admitted to [facility]  January 2023 with a NSTEMI. He had an echocardiogram 05/24/21 which showed an LVEF at 45%, mild to moderate MR. Severe aortic low flow/low gradient aortic stenosis with mean gradient 24 mmHg, peak gradient 41 mmHg, AVA 0.9 cm2, DI 0.24, SVI 38. He had CABG in 2008 and redo CABG in 2018. Cardiac cath 05/28/21 showed severe three vessel CAD, patent free RIMA graft to the diagonal, patent right radial artery graft to the posterolateral  artery, patent LIMA to LAD, moderate ostial LCX with no culprit lesion noted. Pre TAVR CT scans with no good options for access from the femoral arteries, carotid arteries or subclavian arteries. 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 Gated cardiac CT with anatomy suitable for a 29 mm Edwards Sapien 3 valve.  ? ?He was felt to have stage D2 severe aortic valve stenosis with plan to proceed with TAVR. He was then evaluated by the multidisciplinary valve team, including [name] and felt to have severe, symptomatic aortic stenosis and to be a suitable candidate for TAVR, which was set up for 08/10/21.    ? ?Hospital Course  ?   ?Severe AS: s/p successful TAVR with a 29 mm Edwards Sapien 3UR THV via the TA approach on 08/10/21. Post operative echo with LVEF at 35-45% with severe hypokinesis of the apex (known), mean gradient 5.110mHg, peak 9.485mg, AVA by VTI at 2.46cm2, and no PVL. Groin sites are stable. EKG with no high grade block. Continue on his chronic ASA and plavix. Chest tube was placed given trans-apical approach. He had no complications Vance this. Still having mild incisional pain responsive to Tylenol. Plan for discharge home today. Will get CXR and remove suture at follow up.  ? ?Groin pseudoaneurysm: pre procedure groin US[place] thrombosed hematoma therefore no groin compression was needed during the case. ? ?ESRD on HD M-W-F: Nephology consulted during hospital course. He was dialyzed early AM 3/30. Plan if d/c Friday 08/13/21 will be for OP HD arranged by patient.  ? ?HTN: Elevated during hospital course. Resumed on home meds. Will follow as an outpatient ? ?CAD s/p CABGx4 (2008 by [facility] redo CABG x2 (2018 [name]) with recent NSTEMI: cath 05/28/21 showed no obvious culprit lesions for NSTEMI. Continued medical management of CAD was recommended. ? ?Adrenal lesion: pre TAVR CT showed an unchanged size of indeterminate left adrenal lesion measuring 1.3 cm. Further evaluation with dedicated  abdominal MRI should be considered. This will be discussed in the outpatient setting.  ? ?Pulmonary nodules: pre TAVR CT showed small clustered nodules of the left lingula, likely infectious or inflammatory. Largest measures 5 mm. No follow-up needed if patient is low-risk (and has no known or suspected primary neoplasm). Non-contrast chest CT can be considered in 12 months if patient is high-risk. This will be discussed in the outpatient setting.  ? ?Consultants: Nephrology   ? ?_____________ ? ?Discharge Vitals ?Blood pressure 136/70, pulse 84, temperature 98.1 ?F (36.7 ?C), temperature source Oral, resp. rate 16, height '5\' 9"'$  (1.753 m), weight 80.9 kg, SpO2 100 %.  ?Filed Weights  ? 08/11/21 0338253/29/23 1900 08/11/21 2305  ?Weight: 80.8 kg 80.8 kg 80.9 kg  ? ? ? ?Labs & Radiologic Studies  ?  ?CBC ?  Recent Labs  ?  08/11/21 ?1638 08/12/21 ?0410  ?WBC 5.7 6.1  ?HGB 9.3* 9.3*  ?HCT 30.8* 29.6*  ?MCV 91.7 88.9  ?PLT 148* 146*  ? ?Basic Metabolic Panel ?Recent Labs  ?  08/11/21 ?4536 08/12/21 ?0410  ?NA 136 137  ?K 4.2 3.5  ?CL 99 99  ?CO2 23 29  ?GLUCOSE 145* 105*  ?BUN 40* 20  ?CREATININE 5.45* 3.60*  ?CALCIUM 7.9* 8.0*  ?MG 1.5*  --   ? ?Liver Function Tests ?No results for input(s): AST, ALT, ALKPHOS, BILITOT, PROT, ALBUMIN in the last 72 hours. ?No results for input(s): LIPASE, AMYLASE in the last 72 hours. ?Cardiac Enzymes ?No results for input(s): CKTOTAL, CKMB, CKMBINDEX, TROPONINI in the last 72 hours. ?BNP ?Invalid input(s): POCBNP ?D-Dimer ?No results for input(s): DDIMER in the last 72 hours. ?Hemoglobin A1C ?No results for input(s): HGBA1C in the last 72 hours. ?Fasting Lipid Panel ?No results for input(s): CHOL, HDL, LDLCALC, TRIG, CHOLHDL, LDLDIRECT in the last 72 hours. ?Thyroid Function Tests ?No results for input(s): TSH, T4TOTAL, T3FREE, THYROIDAB in the last 72 hours. ? ?Invalid input(s): FREET3 ?_____________  ?DG Chest 2 View ? ?Result Date: 08/12/2021 ?CLINICAL DATA:  S/P transcatheter aortic  valve replacement EXAM: CHEST - 2 VIEW COMPARISON:  August 11, 2021. FINDINGS: Low lung volumes. Enlarged cardiac silhouette status post TAVR and median sternotomy. Right IJ central venous catheter with the ti

## 2021-08-13 LAB — HEPATITIS B DNA, ULTRAQUANTITATIVE, PCR
HBV DNA SERPL PCR-ACNC: NOT DETECTED IU/mL
HBV DNA SERPL PCR-LOG IU: UNDETERMINED log10 IU/mL

## 2021-08-13 LAB — HEPATITIS B SURFACE ANTIGEN

## 2021-08-13 NOTE — Progress Notes (Signed)
CARDIAC REHAB PHASE I  ? ?PRE:  Rate/Rhythm: 87 afib/SR with PVCs ? ?  BP: sitting 136/70 ? ?  SaO2: 94 RA ? ?MODE:  Ambulation: 350 ft  ? ?POST:  Rate/Rhythm: 94 afib ? ?  BP: sitting 133/60  ? ?  SaO2: 88-91 RA ? ?Pt ambulated with RW and on RA. Brief drop to 87 RA with quick increase to 89-90 RA with rest and pursed lip breathing. Encouraged pt to buy an oximeter for spot checks at home. To recliner. Discussed walking daily, IS, restrictions, and CRPII. [facility] CRPII and he wants to do it there. Sts he talked to his cardiologist about it before surgery. He knows that if something changes, he can do CRPII at [facility].  ?[phone]  ? ?Yves Dill CES, ACSM ?08/13/2021 ?8:40 AM ? ? ? ? ?

## 2021-08-13 NOTE — Progress Notes (Signed)
Contacted [employer] and spoke to [employer]. Clinic aware that pt will d/c today and will be at clinic tomorrow (11:15 arrival for 11:30 chair). Pt made aware of appointment time yesterday. D/C summary faxed to clinic for continuation of care.  ? ?Melven Sartorius ?Renal Navigator ?[phone] ?

## 2021-08-13 NOTE — Progress Notes (Signed)
See plans for discharge today and to get his OP HD tomorrow at his home unit-  he looks good and doesn't need anything from [place] today -  call if any questions ? ?[name] ? ?

## 2021-08-13 NOTE — Progress Notes (Signed)
Pt discharged from unit. Medication/discharge instruction given  Analys Ryden K Jeniffer Culliver, RN  

## 2021-08-13 NOTE — Progress Notes (Signed)
3 Days Post-Op Procedure(s) (LRB): ?Transcatheter Aortic Valve Replacement-Transapical (N/A) ?TRANSESOPHAGEAL ECHOCARDIOGRAM (TEE) (N/A) ?Subjective: ?No complaints. Slept well. Ready to go home. ? ?Objective: ?Vital signs in last 24 hours: ?Temp:  [97.8 ?F (36.6 ?C)-98.4 ?F (36.9 ?C)] 98.1 ?F (36.7 ?C) (03/31 0735) ?Pulse Rate:  [80-102] 84 (03/31 0735) ?Cardiac Rhythm: Normal sinus rhythm;Heart block;Bundle branch block (03/30 1910) ?Resp:  [16-27] 16 (03/31 0735) ?BP: (75-136)/(46-78) 136/70 (03/31 0735) ?SpO2:  [76 %-100 %] 100 % (03/31 0735) ? ?Hemodynamic parameters for last 24 hours: ?  ? ?Intake/Output from previous day: ?03/30 0701 - 03/31 0700 ?In: 1102 [P.O.:600; I.V.:202; IV Piggyback:300.1] ?Out: 15 [Urine:15] ?Intake/Output this shift: ?No intake/output data recorded. ? ?General appearance: alert and cooperative ?Neurologic: intact ?Heart: regular rate and rhythm, S1, S2 normal, no murmur ?Lungs: clear to auscultation bilaterally ?Extremities: extremities normal, atraumatic, no cyanosis or edema ?Wound: chest incision and right groin site look good. ? ?Lab Results: ?Recent Labs  ?  08/11/21 ?9924 08/12/21 ?0410  ?WBC 5.7 6.1  ?HGB 9.3* 9.3*  ?HCT 30.8* 29.6*  ?PLT 148* 146*  ? ?BMET:  ?Recent Labs  ?  08/11/21 ?2683 08/12/21 ?0410  ?NA 136 137  ?K 4.2 3.5  ?CL 99 99  ?CO2 23 29  ?GLUCOSE 145* 105*  ?BUN 40* 20  ?CREATININE 5.45* 3.60*  ?CALCIUM 7.9* 8.0*  ?  ?PT/INR: No results for input(s): LABPROT, INR in the last 72 hours. ?ABG ?   ?Component Value Date/Time  ? PHART 7.49 (H) 08/06/2021 1456  ? HCO3 29.7 (H) 08/06/2021 1456  ? TCO2 24 08/10/2021 1607  ? ACIDBASEDEF 1.0 09/19/2016 2235  ? O2SAT 99.3 08/06/2021 1456  ? ?CBG (last 3)  ?No results for input(s): GLUCAP in the last 72 hours. ? ?Assessment/Plan: ?S/P Procedure(s) (LRB): ?Transcatheter Aortic Valve Replacement-Transapical (N/A) ?TRANSESOPHAGEAL ECHOCARDIOGRAM (TEE) (N/A) ? ?POD 3 ?Hemodynamically stable in sinus rhythm.  ? ?Sats ok on room  air overnight. ? ?DC central line. ? ?Plan home today and will resume HD tomorrow in [place]. ? ?He will need to be seen in a week or so with a CXR and chest tube suture can be removed at that time. ? LOS: 3 days  ? ? ?[name] ?08/13/2021 ? ? ?

## 2021-08-16 ENCOUNTER — Telehealth: Payer: Self-pay | Admitting: Physician Assistant

## 2021-08-16 NOTE — Telephone Encounter (Signed)
?  HEART AND VASCULAR CENTER   ?MULTIDISCIPLINARY HEART VALVE TEAM ? ? ?Patient contacted regarding discharge from [facility] on 3/31 ? ?Patient understands to follow up with a structural heart APP on 4/5 at [place].  ?Patient understands discharge instructions? yes ?Patient understands medications and regimen? yes ?Patient understands to bring all medications to this visit? yes ? ?Angelena Form PA-C  [facility]  ? ? ? ?

## 2021-08-17 MED FILL — Heparin Sodium (Porcine) Inj 1000 Unit/ML: Qty: 1000 | Status: AC

## 2021-08-17 MED FILL — Magnesium Sulfate Inj 50%: INTRAMUSCULAR | Qty: 10 | Status: AC

## 2021-08-17 MED FILL — Potassium Chloride Inj 2 mEq/ML: INTRAVENOUS | Qty: 40 | Status: AC

## 2021-08-18 ENCOUNTER — Ambulatory Visit (INDEPENDENT_AMBULATORY_CARE_PROVIDER_SITE_OTHER): Payer: No Typology Code available for payment source | Admitting: Physician Assistant

## 2021-08-18 ENCOUNTER — Ambulatory Visit
Admission: RE | Admit: 2021-08-18 | Discharge: 2021-08-18 | Disposition: A | Discharge status: Home / Self Care | Payer: No Typology Code available for payment source | Source: Ambulatory Visit | Attending: Physician Assistant | Admitting: Physician Assistant

## 2021-08-18 ENCOUNTER — Ambulatory Visit: Payer: No Typology Code available for payment source

## 2021-08-18 VITALS — BP 118/64 | HR 74 | Ht 69.0 in | Wt 178.2 lb

## 2021-08-18 DIAGNOSIS — E279 Disorder of adrenal gland, unspecified: Secondary | ICD-10-CM

## 2021-08-18 DIAGNOSIS — Z951 Presence of aortocoronary bypass graft: Secondary | ICD-10-CM | POA: Diagnosis not present

## 2021-08-18 DIAGNOSIS — Z938 Other artificial opening status: Secondary | ICD-10-CM

## 2021-08-18 DIAGNOSIS — Z4682 Encounter for fitting and adjustment of non-vascular catheter: Secondary | ICD-10-CM | POA: Diagnosis not present

## 2021-08-18 DIAGNOSIS — R918 Other nonspecific abnormal finding of lung field: Secondary | ICD-10-CM

## 2021-08-18 DIAGNOSIS — Z952 Presence of prosthetic heart valve: Secondary | ICD-10-CM | POA: Diagnosis not present

## 2021-08-18 DIAGNOSIS — I10 Essential (primary) hypertension: Secondary | ICD-10-CM

## 2021-08-18 DIAGNOSIS — N186 End stage renal disease: Secondary | ICD-10-CM | POA: Diagnosis not present

## 2021-08-18 DIAGNOSIS — I517 Cardiomegaly: Secondary | ICD-10-CM | POA: Diagnosis not present

## 2021-08-18 DIAGNOSIS — Z992 Dependence on renal dialysis: Secondary | ICD-10-CM

## 2021-08-18 NOTE — Progress Notes (Signed)
?HEART AND VASCULAR CENTER   ?[place] ?                                    ?Cardiology Office Note:   ? ?Date:  08/18/2021  ? ?ID:  [name], DOB [birth year], MRN [id number] ? ?PCP:  [facility]  ?[facility] Cardiologist:  Carlyle Dolly, MD  / Dr. Angelena Form, MD & Dr. Cyndia Bent, MD (TAVR)  ?[place] Electrophysiologist:  None  ? ?Referring MD: [facility]  ? ?TOC s/p TAVR  ? ?History of Present Illness:   ? ?[name] is a 76 y.o. male with a hx of ESRD on HD (M,W,F), PAD, bladder cancer 2013, BPH, HTN, HLD, hypothyroid, cholelithiasis, CAD s/p CABGx4 (2008 by [facility]) and redo CABG x2 (2018 [facility]) with recent NSTEMI in the setting of anemia and symptomatic AS and severe AS s/p TAVR (08/10/21) who presents to clinic for follow up. ? ?[name] has been followed by Dr. Harl Bowie for his cardiology care. He was [facility] in [facility]/2021 with a NSTEMI. He had an echocardiogram 05/24/21 which showed an LVEF at 45%, mild to moderate MR. Severe aortic low flow/low gradient aortic stenosis with mean gradient 24 mmHg, peak gradient 41 mmHg, AVA 0.9 cm2, DI 0.24, SVI 38. He had CABG in 2008 and redo CABG in 2018. Cardiac cath 05/28/21 showed severe three vessel CAD, patent free RIMA graft to the diagonal, patent right radial artery graft to the posterolateral artery, patent LIMA to LAD, moderate ostial LCX with no culprit lesion noted. Subsequent pre TAVR CT scans showed no good options for access from the femoral arteries, carotid arteries or subclavian arteries. 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  ? ?He underwent successful TAVR with a 29 mm Edwards Sapien 3UR THV via the TA approach on 08/10/21. Chest tube removed the following day. Post operative echo showed LVEF at 35-45% with severe hypokinesis of the apex (known), mean gradient 5.18mHg, peak 9.421mg, AVA by VTI at 2.46cm2, and no PVL. A follow up groin US[place]ore[place]owed his right PSA was thrombosed.  He was dialyzed during his admission. He was discharged on his chronic DAPT.  ? ?Today the patient presents to clinic for follow up. Here with his wife. No CP or SOB. He has some very mild LE edema in right leg since admission. No orthopnea or PND. No dizziness or syncope. No blood in stool or urine. No palpitations. He can walk to the mailbox without having to stop as many times. He has a lot of soreness at chest tube site. Reported it being worse than when he had this two bypasses. He is very thankful for the care of Dr. BaCyndia Bent ? ? ? ?Past Medical History:  ?Diagnosis Date  ? Acute myocardial infarction of other inferior wall, initial episode of care   ? Anemia   ? hx of bld transfusion  ? Anxiety   ? Arthritis   ? [facility]  ? [facility] removed, has cystoscopy 10/2016, has returned x 2  ? Chronic kidney disease   ? [facility]? [place]  ? patient is not aware of this dx  ? Coronary artery disease   ? Artery bypass graft Jan 2008  [facility]  ? Dyspnea   ? with  exertion  ? GERD (gastroesophageal reflux disease)   ? Hearing loss   ? wears hearing aids  ? History of blood transfusion 2017  ? History of hiatal hernia   ? Hyperlipidemia   ? Hypertension   ? Hypothyroidism   ? PONV (postoperative nausea and vomiting)   ? only with TURP surgery, no other problems with the other surgeries  ? 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 w/ S3UR 80m TA approach with Dr. MAngelena Formand Dr. BCyndia Bent ? Severe aortic stenosis   ? Sleep apnea   ? uses CPAP nightly  ? Stroke ([facility]   ? Tobacco user   ? quit 09/2016  ? ? ?Past Surgical History:  ?Procedure Laterality Date  ? APPENDECTOMY    ? AV FISTULA PLACEMENT Right 02/11/2020  ? Procedure: RIGHT ARM BRACHIOCEPHALIC ARTERIOVENOUS (AV) FISTULA;  Surgeon: DAngelia Mould MD;  [place]  Service: Vascular;  Laterality: Right;  ? CARDIAC CATHETERIZATION   05/28/2021  ? COLONOSCOPY WITH PROPOFOL N/A 09/02/2020  ? Procedure: COLONOSCOPY WITH PROPOFOL;  Surgeon: RRogene Houston MD;  [facility];  Service: Endoscopy;  Laterality: N/A;  ? CORONARY ARTERY BYPASS GRAFT  06/14/2006  ? 5 blockages  ? CORONARY ARTERY BYPASS GRAFT N/A 09/19/2016  ? Procedure: REDO CORONARY ARTERY BYPASS GRAFTING (CABG) x two, using right internal mammary artery and left radial artery;  Surgeon: BGaye Pollack MD;  [place];  Service: Open Heart Surgery;  Laterality: N/A;  ? FOOT SURGERY Right   ? HEMOSTASIS CLIP PLACEMENT  09/02/2020  ? Procedure: HEMOSTASIS CLIP PLACEMENT;  Surgeon: RRogene Houston MD;  [facility];  Service: Endoscopy;;  ? HERNIA REPAIR    ? x 3 surgeries  ? HOT HEMOSTASIS  09/02/2020  ? Procedure: HOT HEMOSTASIS (ARGON PLASMA COAGULATION/BICAP);  Surgeon: RRogene Houston MD;  [facility];  Service: Endoscopy;;  ? LEFT HEART CATH AND CORS/GRAFTS ANGIOGRAPHY N/A 09/14/2016  ? Procedure: Left Heart Cath and Cors/Grafts Angiography;  Surgeon: TTroy Sine MD;  [place];  Service: Cardiovascular;  Laterality: N/A;  ? LEFT HEART CATHETERIZATION WITH CORONARY/GRAFT ANGIOGRAM N/A 01/31/2014  ? Procedure: LEFT HEART CATHETERIZATION WITH CBeatrix Fetters  Surgeon: MSanda Klein MD;  [facility];  Service: Cardiovascular;  Laterality: N/A;  ? PERCUTANEOUS CORONARY STENT INTERVENTION (PCI-S)  01/31/2014  ? Procedure: PERCUTANEOUS CORONARY STENT INTERVENTION (PCI-S);  Surgeon: MSanda Klein MD;  [facility];  Service: Cardiovascular;;  ? POLYPECTOMY  09/02/2020  ? Procedure: POLYPECTOMY;  Surgeon: RRogene Houston MD;  [facility];  Service: Endoscopy;;  ? RADIAL ARTERY HARVEST Left 09/19/2016  ? Procedure: RADIAL ARTERY HARVEST;  Surgeon: BGaye Pollack MD;  [place]  Service: Open Heart Surgery;  Laterality: Left;  ? RIGHT/LEFT HEART CATH AND CORONARY/GRAFT  ANGIOGRAPHY N/A 05/28/2021  ? Procedure: RIGHT/LEFT HEART CATH AND CORONARY/GRAFT ANGIOGRAPHY;  Surgeon: MBurnell Blanks MD;  [place];  Service: Cardiovascular;  Laterality: N/A;  ? TEE WITHOUT CARDIOVERSION N/A 09/19/2016  ? Procedure: TRANSESOPHAGEAL ECHOCARDIOGRAM (TEE);  Surgeon: BGaye Pollack MD;  [place]  Service: Open Heart Surgery;  Laterality: N/A;  ? TEE WITHOUT CARDIOVERSION N/A 08/10/2021  ? Procedure: TRANSESOPHAGEAL ECHOCARDIOGRAM (TEE);  Surgeon: MBurnell Blanks MD;  [place]  Service: Open Heart Surgery;  Laterality: N/A;  ? TRANSCATHETER AORTIC VALVE REPLACEMENT, TRANSAPICAL N/A 08/10/2021  ? Procedure: Transcatheter  Aortic Valve Replacement-Transapical;  Surgeon: Burnell Blanks, MD;  Location: [place];  Service: Open Heart Surgery;  Laterality: N/A;  ? TRANSURETHRAL RESECTION BLADDER TUMOR and bilateral RPG's  10/07/2020  ? [facility]  ? ? ?Current Medications: ?Current Meds  ?Medication Sig  ? acetaminophen (TYLENOL) 325 MG tablet Take 2 tablets (650 mg total) by mouth every 6 (six) hours as needed for mild pain.  ? allopurinol (ZYLOPRIM) 100 MG tablet Take 100 mg by mouth daily.   ? aspirin EC 81 MG EC tablet Take 1 tablet (81 mg total) by mouth daily. Swallow whole.  ? atorvastatin (LIPITOR) 80 MG tablet Take 80 mg by mouth daily.  ? carvedilol (COREG) 6.25 MG tablet Take 1 tablet (6.25 mg total) by mouth 2 (two) times daily.  ? clopidogrel (PLAVIX) 75 MG tablet Take 75 mg by mouth daily.  ? finasteride (PROSCAR) 5 MG tablet Take 5 mg by mouth daily.  ? folic acid (FOLVITE) 1 MG tablet Take 1 mg by mouth daily.  ? furosemide (LASIX) 80 MG tablet Take 1 tablet (80 mg total) by mouth every Tuesday, Thursday, Saturday, and Sunday.  ? isosorbide mononitrate (IMDUR) 120 MG 24 hr tablet Take 1 tablet (120 mg total) by mouth daily.  ? levothyroxine (SYNTHROID, LEVOTHROID) 50 MCG tablet Take 50 mcg by mouth daily before breakfast.  ?  lidocaine-prilocaine (EMLA) cream Apply 1 application. topically every Monday, Wednesday, and Friday with hemodialysis.  ? nitroGLYCERIN (NITROSTAT) 0.4 MG SL tablet Place 0.4 mg under the tongue every 5 (five)

## 2021-08-18 NOTE — Patient Instructions (Signed)
Medication Instructions:  ?Your physician recommends that you continue on your current medications as directed. Please refer to the Current Medication list given to you today. ? ?*If you need a refill on your cardiac medications before your next appointment, please call your pharmacy* ? ? ?Lab Work: ?None ordered  ? ?If you have labs (blood work) drawn today and your tests are completely normal, you will receive your results only by: ?MyChart Message (if you have MyChart) OR ?A paper copy in the mail ?If you have any lab test that is abnormal or we need to change your treatment, we will call you to review the results. ? ? ?Testing/Procedures: ?Your physician has requested that you have a chest x-ray ? ?Chest X-ray Instructions: ?   1. You may have this done at the [facility], located in the [place] on the 1st floor. ?   2. You do no have to have an appointment. ?   3. [place] ?       [place], [place] [address] ?       [phone] ?       Monday - Friday  8:00 am - 5:00 pm ? ? ?Follow-Up: ?Follow up as scheduled  :1}  ? ? ?Other Instructions ?None   ?

## 2021-08-20 IMAGING — DX DG CHEST 2V
2 series · 2 of 2 positions shown · non-contrast
Comparison: 08/08/2020.

CLINICAL DATA: Shortness of breath.

EXAM:
CHEST - 2 VIEW

[chest pa]
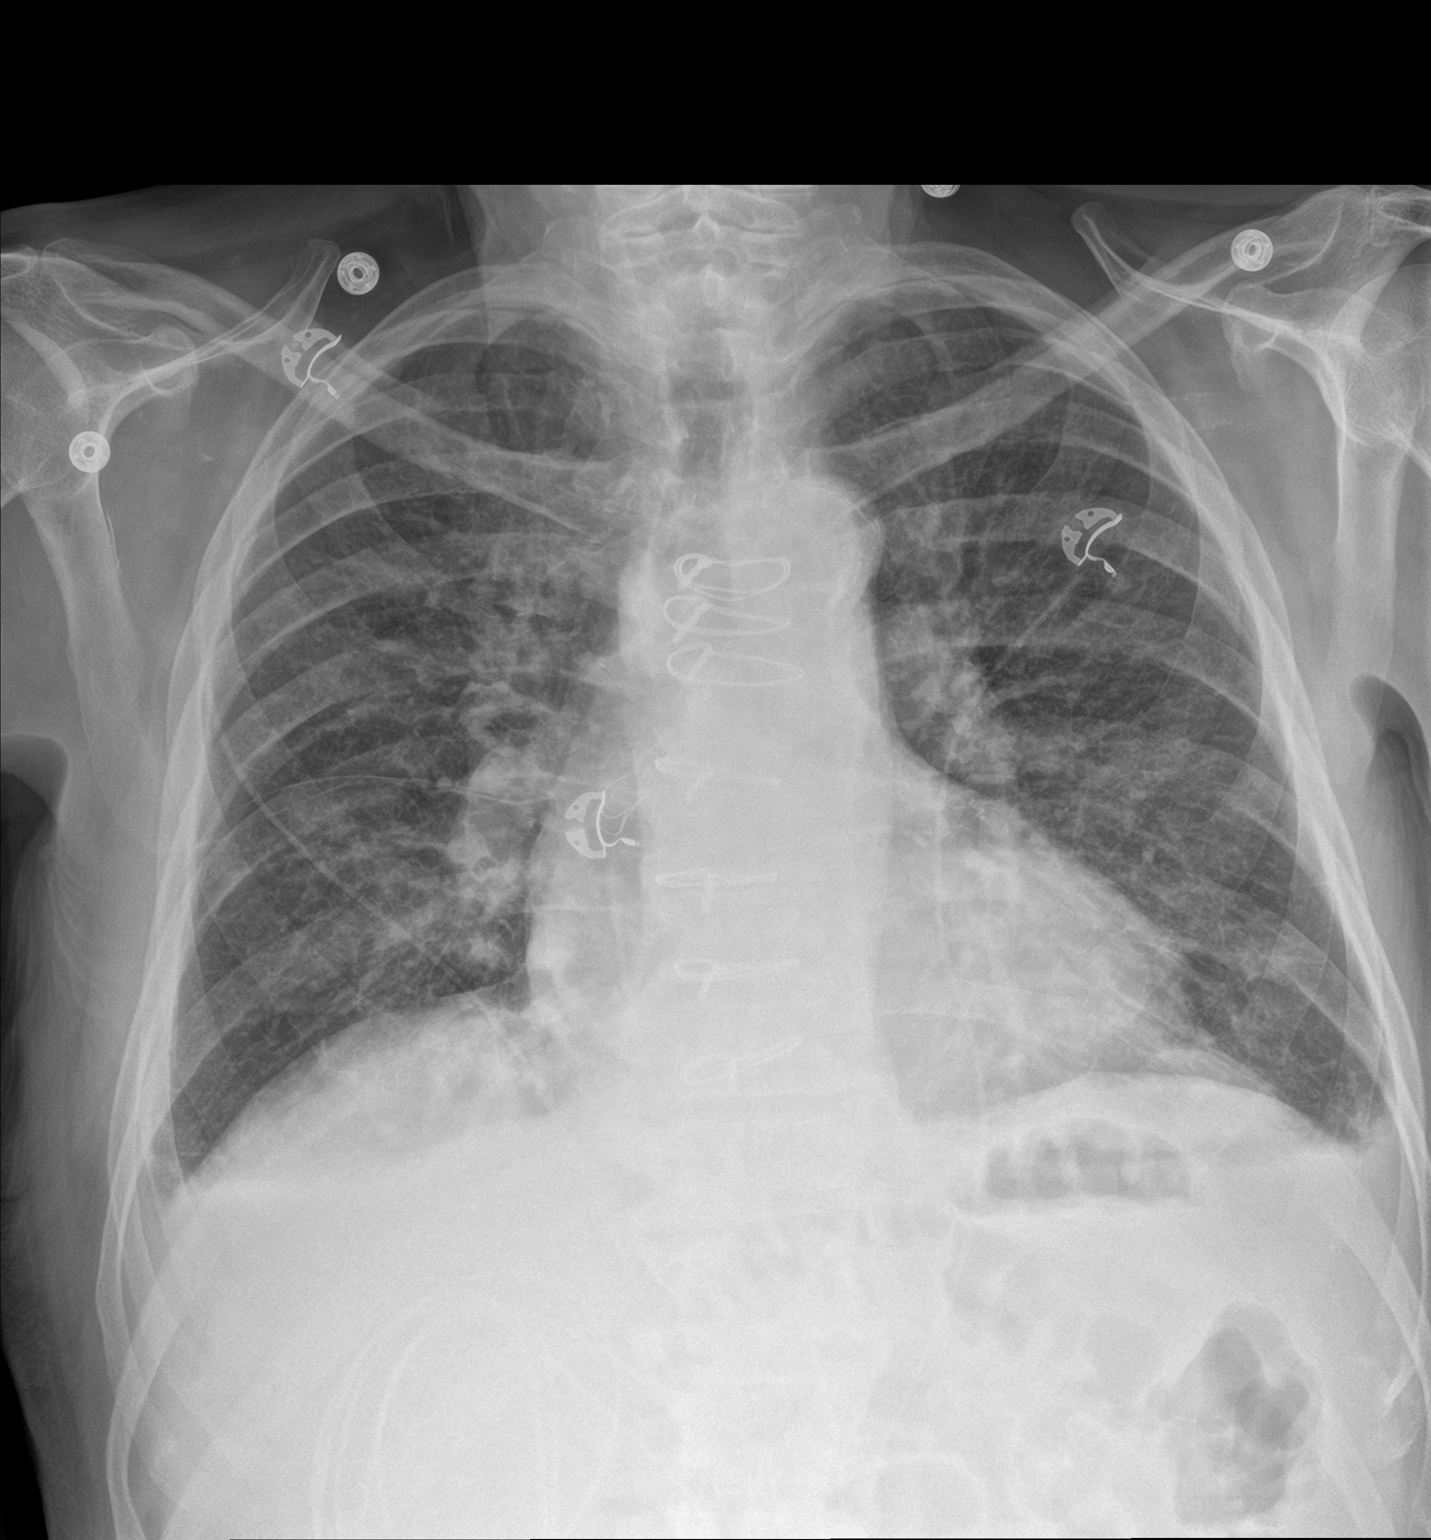

[chest lat]
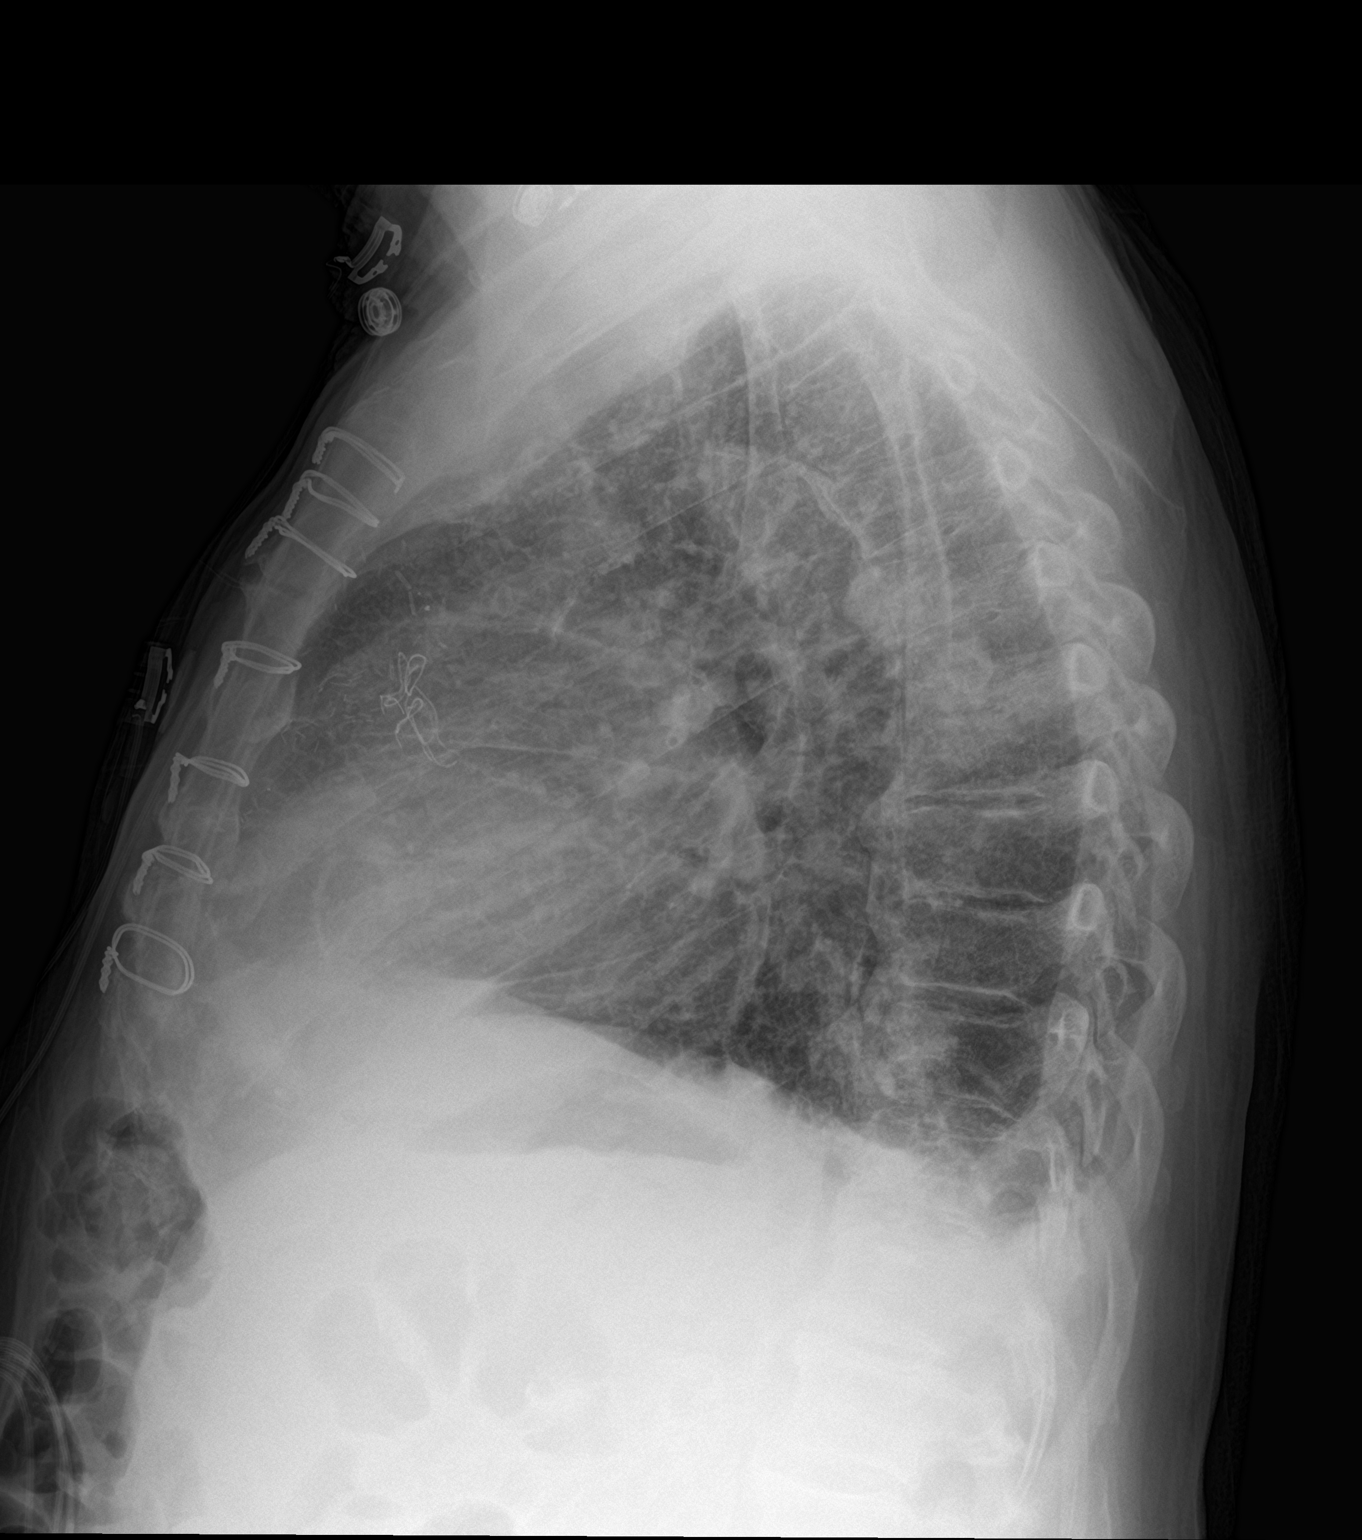

[2 of 2 positions shown; findings below may reference images not displayed]

FINDINGS: Trachea is midline. Heart is mildly enlarged. Thoracic aorta is
calcified. Mild interstitial prominence and indistinctness appear
similar. Small bilateral pleural effusions, new or increased.
IMPRESSION: 1. Congestive heart failure.
2.  Aortic atherosclerosis ([id number]).

## 2021-09-09 ENCOUNTER — Other Ambulatory Visit: Payer: Self-pay | Admitting: Physician Assistant

## 2021-09-09 ENCOUNTER — Encounter: Payer: Self-pay | Admitting: Physician Assistant

## 2021-09-09 MED ORDER — AMLODIPINE BESYLATE 5 MG PO TABS: 5.0000 mg | ORAL_TABLET | Freq: Every day | ORAL | 11 refills | Status: DC

## 2021-09-14 NOTE — Progress Notes (Signed)
?[facility]   ?[employer] ?                                    ?Cardiology Office Note:   ? ?Date:  09/17/2021  ? ?ID:  [name], DOB August 12, 1945, MRN [id number] ? ?PCP:  [facility]  ?[facility] Cardiologist:  Carlyle Dolly, MD  / Dr. Angelena Form, MD & Dr. Cyndia Bent MD (TAVR)  ?[place] Electrophysiologist:  None  ? ?Referring MD: [facility]  ? ?1 month s/p TAVR  ? ?History of Present Illness:   ? ?[name] is a 76 y.o. male with a hx of ESRD on HD (M,W,F), PAD, bladder cancer 2013, BPH, HTN, HLD, hypothyroid, cholelithiasis, CAD s/p CABGx4 (2008 by [facility]) and redo CABG x2 (2018 [facility]) with recent NSTEMI in the setting of anemia and symptomatic AS and severe AS s/p TAVR (08/10/21) who presents to clinic for follow up. ? ?[name] has been followed by Dr. Harl Bowie for his cardiology care. He was admitted to [facility] in 05/2021 with a NSTEMI. He had an echocardiogram 05/24/21 which showed an LVEF at 45%, mild to moderate MR. Severe aortic low flow/low gradient aortic stenosis with mean gradient 24 mmHg, peak gradient 41 mmHg, AVA 0.9 cm2, DI 0.24, SVI 38. He had CABG in 2008 and redo CABG in 2018. Cardiac cath 05/28/21 showed severe three vessel CAD, patent free RIMA graft to the diagonal, patent right radial artery graft to the posterolateral artery, patent LIMA to LAD, moderate ostial LCX with no culprit lesion noted. Subsequent pre TAVR CT scans showed no good options for access from the femoral arteries, carotid arteries or subclavian arteries. 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  ? ?He underwent successful TAVR with a 29 mm Edwards Sapien 3UR THV via the TA approach on 08/10/21. Chest tube removed the following day. Post operative echo showed LVEF at 35-45% with severe hypokinesis of the apex (known), mean gradient 5.24mHg, peak 9.458mg, AVA by VTI at 2.46cm2, and no PVL. A follow up groin USKoreahowed his right PSA was  thrombosed. He was discharged on his chronic DAPT.  ? ?Today the patient presents to clinic for follow up. Here with his wife. He was able to get out and mow his yard. No CP or SOB. No LE edema, orthopnea or PND. No dizziness or syncope. No blood in stool or urine. No palpitations. Does have some soreness from incision and worsening of a previous incisional hernia due to swelling from TA approach and chest tube. Has a lot of ecchymosis and wants to come off aspirin.  ? ? ? ?Past Medical History:  ?Diagnosis Date  ? Acute myocardial infarction of other inferior wall, initial episode of care   ? Anemia   ? hx of bld transfusion  ? Anxiety   ? Arthritis   ? [facility]  ? bladder 2013 removed, has cystoscopy 10/2016, has returned x 2  ? Chronic kidney disease   ? [facility] [place]? COPD (ch[place] obstructive pulmonary disease) ([place]  ? patient is not aware of this dx  ? Coronary artery disease   ? Artery bypass graft Jan 2008  [facility]  ? GERD (gastroesophageal reflux disease)   ? Hearing loss   ? wears hearing aids  ? History of blood transfusion  2017  ? History of hiatal hernia   ? Hyperlipidemia   ? Hypertension   ? Hypothyroidism   ? PONV (postoperative nausea and vomiting)   ? only with TURP surgery, no other problems with the other surgeries  ? 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 w/ S3UR 67m TA approach with Dr. MAngelena Formand Dr. BCyndia Bent ? Severe aortic stenosis   ? Sleep apnea   ? uses CPAP nightly  ? [facility]   ? Tobacco user   ? quit 09/2016  ? ? ?Past Surgical History:  ?Procedure Laterality Date  ? APPENDECTOMY    ? AV FISTULA PLACEMENT Right 02/11/2020  ? Procedure: RIGHT ARM BRACHIOCEPHALIC ARTERIOVENOUS (AV) FISTULA;  Surgeon: DAngelia Mould MD;  [place]  Service: Vascular;  Laterality: Right;  ? CARDIAC CATHETERIZATION  05/28/2021  ? COLONOSCOPY WITH PROPOFOL N/A 09/02/2020  ? Procedure: COLONOSCOPY WITH PROPOFOL;   Surgeon: RRogene Houston MD;  [facility];  Service: Endoscopy;  Laterality: N/A;  ? CORONARY ARTERY BYPASS GRAFT  06/14/2006  ? 5 blockages  ? CORONARY ARTERY BYPASS GRAFT N/A 09/19/2016  ? Procedure: REDO CORONARY ARTERY BYPASS GRAFTING (CABG) x two, using right internal mammary artery and left radial artery;  Surgeon: BGaye Pollack MD;  [facility] [place];  Service: Open Heart Surgery;  Laterality: N/A;  ? FOOT SURGERY Right   ? HEMOSTASIS CLIP PLACEMENT  09/02/2020  ? Procedure: HEMOSTASIS CLIP PLACEMENT;  Surgeon: RRogene Houston MD;  [facility];  Service: Endoscopy;;  ? HERNIA REPAIR    ? x 3 surgeries  ? HOT HEMOSTASIS  09/02/2020  ? Procedure: HOT HEMOSTASIS (ARGON PLASMA COAGULATION/BICAP);  Surgeon: RRogene Houston MD;  [facility];  Service: Endoscopy;;  ? LEFT HEART CATH AND CORS/GRAFTS ANGIOGRAPHY N/A 09/14/2016  ? Procedure: Left Heart Cath and Cors/Grafts Angiography;  Surgeon: TTroy Sine MD;  [place] [place] [facility];  Service: Cardiovascular;  Laterality: N/A;  ? LEFT HEART CATHETERIZATION WITH CORONARY/GRAFT ANGIOGRAM N/A 01/31/2014  ? Procedure: LEFT HEART CATHETERIZATION WITH CBeatrix Vance  Surgeon: MSanda Klein MD;  [facility];  Service: Cardiovascular;  Laterality: N/A;  ? PERCUTANEOUS CORONARY STENT INTERVENTION (PCI-S)  01/31/2014  ? Procedure: PERCUTANEOUS CORONARY STENT INTERVENTION (PCI-S);  Surgeon: MSanda Klein MD;  [facility];  Service: Cardiovascular;;  ? POLYPECTOMY  09/02/2020  ? Procedure: POLYPECTOMY;  Surgeon: RRogene Houston MD;  [facility];  Service: Endoscopy;;  ? RADIAL ARTERY HARVEST Left 09/19/2016  ? Procedure: RADIAL ARTERY HARVEST;  Surgeon: BGaye Pollack MD;  [place]: [place]  Service: Open Heart Surgery;  Laterality: Left;  ? RIGHT/LEFT HEART CATH AND CORONARY/GRAFT ANGIOGRAPHY N/A 05/28/2021  ? Procedure: RIGHT/LEFT HEART CATH AND CORONARY/GRAFT ANGIOGRAPHY;   Surgeon: MBurnell Blanks MD;  [facility];  Service: Cardiovascular;  Laterality: N/A;  ? TEE WITHOUT CARDIOVERSION N/A 09/19/2016  ? Procedure: TRANSESOPHAGEAL ECHOCARDIOGRAM (TEE);  Surgeon: BGaye Pollack MD;  Location: [place]  Service: Open Heart Surgery;  Laterality: N/A;  ? TEE WITHOUT CARDIOVERSION N/A 08/10/2021  ? Procedure: TRANSESOPHAGEAL ECHOCARDIOGRAM (TEE);  Surgeon: MBurnell Blanks MD;  [place]  Service: Open Heart Surgery;  Laterality: N/A;  ? TRANSCATHETER AORTIC VALVE REPLACEMENT, TRANSAPICAL N/A 08/10/2021  ? Procedure: Transcatheter Aortic Valve Replacement-Transapical;  Surgeon: MBurnell Blanks MD;  [place]  Service: Open Heart Surgery;  Laterality: N/A;  ? TRANSURETHRAL  RESECTION BLADDER TUMOR and bilateral RPG's  10/07/2020  ? [facility]  ? ? ?Current Medications: ?Current Meds  ?Medication Sig  ? acetaminophen (TYLENOL) 325 MG tablet Take 2 tablets (650 mg total) by mouth every 6 (six) hours as needed for mild pain.  ? allopurinol (ZYLOPRIM) 100 MG tablet Take 100 mg by mouth daily.   ? atorvastatin (LIPITOR) 80 MG tablet Take 80 mg by mouth daily.  ? carvedilol (COREG) 6.25 MG tablet Take 1 tablet (6.25 mg total) by mouth 2 (two) times daily.  ? clopidogrel (PLAVIX) 75 MG tablet Take 75 mg by mouth daily.  ? finasteride (PROSCAR) 5 MG tablet Take 5 mg by mouth daily.  ? folic acid (FOLVITE) 1 MG tablet Take 1 mg by mouth daily.  ? furosemide (LASIX) 80 MG tablet Take 1 tablet (80 mg total) by mouth every Tuesday, Thursday, Saturday, and Sunday.  ? isosorbide mononitrate (IMDUR) 120 MG 24 hr tablet Take 1 tablet (120 mg total) by mouth daily.  ? levothyroxine (SYNTHROID, LEVOTHROID) 50 MCG tablet Take 50 mcg by mouth daily before breakfast.  ? lidocaine-prilocaine (EMLA) cream Apply 1 application. topically every Monday, Wednesday, and Friday with hemodialysis.  ? nitroGLYCERIN (NITROSTAT) 0.4 MG SL tablet Place 0.4 mg under the  tongue every 5 (five) minutes as needed for chest pain (max 3 doses).  ? Omega-3 Fatty Acids (RA FISH OIL) 1000 MG CAPS Take 2,000 mg by mouth 2 (two) times daily.  ? omeprazole (PRILOSEC) 40 MG capsule Take 40 mg

## 2021-09-17 ENCOUNTER — Other Ambulatory Visit: Payer: Self-pay | Admitting: Physician Assistant

## 2021-09-17 ENCOUNTER — Ambulatory Visit (HOSPITAL_COMMUNITY): Payer: No Typology Code available for payment source | Attending: Cardiology

## 2021-09-17 ENCOUNTER — Ambulatory Visit (INDEPENDENT_AMBULATORY_CARE_PROVIDER_SITE_OTHER): Payer: No Typology Code available for payment source | Admitting: Physician Assistant

## 2021-09-17 VITALS — BP 162/60 | HR 62 | Ht 69.0 in | Wt 181.0 lb

## 2021-09-17 DIAGNOSIS — Z952 Presence of prosthetic heart valve: Secondary | ICD-10-CM

## 2021-09-17 DIAGNOSIS — I10 Essential (primary) hypertension: Secondary | ICD-10-CM | POA: Diagnosis not present

## 2021-09-17 DIAGNOSIS — R918 Other nonspecific abnormal finding of lung field: Secondary | ICD-10-CM

## 2021-09-17 DIAGNOSIS — N186 End stage renal disease: Secondary | ICD-10-CM

## 2021-09-17 DIAGNOSIS — E279 Disorder of adrenal gland, unspecified: Secondary | ICD-10-CM

## 2021-09-17 DIAGNOSIS — R001 Bradycardia, unspecified: Secondary | ICD-10-CM

## 2021-09-17 DIAGNOSIS — Z951 Presence of aortocoronary bypass graft: Secondary | ICD-10-CM

## 2021-09-17 DIAGNOSIS — Z992 Dependence on renal dialysis: Secondary | ICD-10-CM

## 2021-09-17 LAB — ECHOCARDIOGRAM COMPLETE
AR max vel: 2.16 cm2
AV Area VTI: 2.22 cm2
AV Area mean vel: 2.05 cm2
AV Mean grad: 8 mmHg
AV Peak grad: 14.2 mmHg
Ao pk vel: 1.89 m/s
Area-P 1/2: 3.77 cm2
MV M vel: 5.73 m/s
MV Peak grad: 131.3 mmHg
Radius: 0.5 cm
S' Lateral: 4.1 cm

## 2021-09-17 MED ORDER — AMLODIPINE BESYLATE 5 MG PO TABS: 5.0000 mg | ORAL_TABLET | Freq: Every day | ORAL | 3 refills | Status: DC

## 2021-09-17 NOTE — Patient Instructions (Signed)
Medication Instructions:  ?Your physician has recommended you make the following change in your medication:  ?1-STOP Aspirin ? ?*If you need a refill on your cardiac medications before your next appointment, please call your pharmacy* ? ? ?Lab Work: ?If you have labs (blood work) drawn today and your tests are completely normal, you will receive your results only by: ?MyChart Message (if you have MyChart) OR ?A paper copy in the mail ?If you have any lab test that is abnormal or we need to change your treatment, we will call you to review the results. ? ?Follow-Up: ?At [facility], you and your health needs are our priority.  As part of our continuing mission to provide you with exceptional heart care, we have created designated Provider Care Teams.  These Care Teams include your primary Cardiologist (physician) and Advanced Practice Providers (APPs -  Physician Assistants and Nurse Practitioners) who all work together to provide you with the care you need, when you need it. ? ?We recommend signing up for the patient portal called "MyChart".  Sign up information is provided on this After Visit Summary.  MyChart is used to connect with patients for Virtual Visits (Telemedicine).  Patients are able to view lab/test results, encounter notes, upcoming appointments, etc.  Non-urgent messages can be sent to your provider as well.   ?To learn more about what you can do with MyChart, go to [email or web address].   ? ?Your next appointment:   ?1 year(s) ? ?The format for your next appointment:   ?In Person ? ?Provider:   ?Nell Range, PA-C{ ? ? ?Important Information About Sugar ? ? ? ? ?  ?

## 2021-10-13 ENCOUNTER — Ambulatory Visit: Payer: No Typology Code available for payment source | Admitting: Vascular Surgery

## 2021-11-23 ENCOUNTER — Ambulatory Visit
Admission: EM | Admit: 2021-11-23 | Discharge: 2021-11-23 | Disposition: A | Discharge status: Home / Self Care | Payer: No Typology Code available for payment source | Attending: Nurse Practitioner | Admitting: Nurse Practitioner

## 2021-11-23 DIAGNOSIS — R197 Diarrhea, unspecified: Secondary | ICD-10-CM | POA: Insufficient documentation

## 2021-11-23 NOTE — Discharge Instructions (Addendum)
-   We were unable to draw your blood today; we will do the stool testing first and can check the blood work later if diarrhea continues - Please decrease the amount of cool aid you drink and try to avoid artificial sweeteners as these can be natural laxatives - Please return the stool testing to [place] when you are able to and we will check for infectious causes for the diarrhea

## 2021-11-23 NOTE — ED Provider Notes (Signed)
[facility]    CSN: [id number] Arrival date & time: 11/23/21  1206      History   Chief Complaint Chief Complaint  Patient presents with   Diarrhea    HPI [name] is a 76 y.o. male.   Patient presents with diarrhea worsening for the past few weeks.  Reports a history of diarrhea for the past few months, however has not been bad until recently.  Reports diarrhea has some solid pieces, sometimes turns close to water near the end of the day.  Reports more than 5 episodes per day.  Denies abdominal pain, constipation, blood in his stool, mucus in his stool.  Also denies any heartburn, bloating, flatulence.  No nausea/vomiting, melena, hematochezia, new rash, jaundice, fevers, unexplained weight loss.  Reports his appetite is normal.  He denies dizziness, pallor, diaphoresis, chest pain, shortness of breath.  He reports the diarrhea is starting to affect his ability to complete hemodialysis.  Patient reports he started making Kool-Aid with an artificial sweetener a couple of months ago.  Reports he drinks about 2 quarts of Kool-Aid per day.  Does not drink very much water.  He denies any recent antibiotic use, change in eating habits, change in medications.  Medical history significant for heart attack, bladder cancer, chronic kidney disease on dialysis Monday, Wednesday, Friday, severe aortic stenosis status post TAVR, hypothyroidism.  Reports good compliance with medication, follow with specialty.  Primary care is at the [place].     Past Medical History:  Diagnosis Date   Acute myocardial infarction of other inferior wall, initial episode of care    Anemia    hx of bld transfusion   Anxiety    Arthritis    [place])    bladder 2013 removed, has cystoscopy 10/2016, has returned x 2   Chronic kidney disease    dialysis - M-W-F at [facility] [place]   COPD (chronic obstructive pulmonary disease) ([place])    patient is not aware of this dx   Coronary artery disease     Artery bypass graft Jan 2008   [facility]    GERD (gastroesophageal reflux disease)    Hearing loss    wears hearing aids   History of blood transfusion 2017   History of hiatal hernia    Hyperlipidemia    Hypertension    Hypothyroidism    PONV (postoperative nausea and vomiting)    only with TURP surgery, no other problems with the other surgeries   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 MAngelena Formand Dr. BCyndia Bent  Severe aortic stenosis    Sleep apnea    uses CPAP nightly   Stroke ([place]    Tobacco user    quit 09/2016    Patient Active Problem List   Diagnosis Date Noted   S/P TAVR (transcatheter aortic valve replacement) 08/10/2021   Aortic stenosis 05/26/2021   Abdominal pain 04/05/2021   Cholelithiasis 04/05/2021   Hypertensive urgency 04/05/2021   Adenoma of right adrenal gland 04/05/2021   Elevated lipase 04/05/2021   Hypothyroidism (acquired) 04/05/2021   GERD (gastroesophageal reflux disease) 04/05/2021   History of COPD 04/05/2021   Malignant neoplasm of overlapping sites of bladder ([place] 09/07/2020   End-stage renal disease on hemodialysis ([place] 09/02/2020   Rectal bleeding    Acute diastolic CHF (congestive heart failure) ([place] 05/09/2020   Acute exacerbation of Diastolic CHF (congestive heart failure) ([place] 05/08/2020  Symptomatic bradycardia with heart failure 05/08/2020   COPD without exacerbation (HCC)---40 Pack Years 05/08/2020   Acute respiratory failure with hypoxia ([place]) 05/08/2020   Uremia in the setting of AKI on CKD V 02/07/2020   CKD (chronic kidney disease), stage V ([place]) 02/07/2020   Coronary artery disease involving native coronary artery of native heart without angina pectoris 12/16/2019   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   Gastroenteritis 12/16/2019   History of bladder cancer 12/16/2019   Hyperkalemia 12/16/2019   Renal lesion 05/16/2019   Proteinuria  03/28/2019   Acute blood loss anemia 06/08/2018   Anemia in chronic kidney disease 06/08/2018   BPH (benign prostatic hyperplasia) 06/08/2018   Coronary artery disease with unstable angina pectoris ([place]) 06/08/2018   Elevated troponin 06/08/2018   GIB (gastrointestinal bleeding) 06/07/2018   (HFpEF) heart failure with preserved ejection fraction ([place]) 06/30/2017   Coronary artery disease involving autologous artery coronary bypass graft 06/29/2017   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/19/2016   NSTEMI (non-ST elevated myocardial infarction) ([place]) 09/14/2016   Chest pain 09/14/2016   Obstructive sleep apnea 09/14/2016   Acute myocardial infarction of other inferior wall, initial episode of care    Unstable angina ([place]) 01/30/2014   OVERWEIGHT/OBESITY 02/02/2010   HYPERLIPIDEMIA, MIXED 01/22/2009   OTHER CHEST PAIN 01/22/2009   TOBACCO USER 01/17/2009   Essential hypertension 01/17/2009    Past Surgical History:  Procedure Laterality Date   APPENDECTOMY     AV FISTULA PLACEMENT Right 02/11/2020   Procedure: RIGHT ARM BRACHIOCEPHALIC ARTERIOVENOUS (AV) FISTULA;  Surgeon: Angelia Mould, MD;  [facility];  Service: Vascular;  Laterality: Right;   CARDIAC CATHETERIZATION  05/28/2021   COLONOSCOPY WITH PROPOFOL N/A 09/02/2020   Procedure: COLONOSCOPY WITH PROPOFOL;  Surgeon: Rogene Houston, MD;  [facility];  Service: Endoscopy;  Laterality: N/A;   CORONARY ARTERY BYPASS GRAFT  06/14/2006   5 blockages   CORONARY ARTERY BYPASS GRAFT N/A 09/19/2016   Procedure: REDO CORONARY ARTERY BYPASS GRAFTING (CABG) x two, using right internal mammary artery and left radial artery;  Surgeon: [name], MD;  Location: [place];  Service: Open Heart Surgery;  Laterality: N/A;   FOOT SURGERY Right    HEMOSTASIS CLIP PLACEMENT  09/02/2020   Procedure: HEMOSTASIS CLIP PLACEMENT;  Surgeon: Rogene Houston, MD;  [facility];  Service: Endoscopy;;   HERNIA REPAIR     x 3 surgeries    HOT HEMOSTASIS  09/02/2020   Procedure: HOT HEMOSTASIS (ARGON PLASMA COAGULATION/BICAP);  Surgeon: Rogene Houston, MD;  [facility];  Service: Endoscopy;;   LEFT HEART CATH AND CORS/GRAFTS ANGIOGRAPHY N/A 09/14/2016   Procedure: Left Heart Cath and Cors/Grafts Angiography;  Surgeon: Troy Sine, MD;  Location: [place] CV LAB;  Service: Cardiovascular;  Laterality: N/A;   LEFT HEART CATHETERIZATION WITH CORONARY/GRAFT ANGIOGRAM N/A 01/31/2014   Procedure: LEFT HEART CATHETERIZATION WITH Beatrix Fetters;  Surgeon: Sanda Klein, MD;  Location: [place] CATH LAB;  Service: Cardiovascular;  Laterality: N/A;   PERCUTANEOUS CORONARY STENT INTERVENTION (PCI-S)  01/31/2014   Procedure: PERCUTANEOUS CORONARY STENT INTERVENTION (PCI-S);  Surgeon: Sanda Klein, MD;  Location[facility] LAB;  Service: Cardiovascular;;   POLYPECTOMY  09/02/2020   Procedure: POLYPECTOMY;  Surgeon: Rogene Houston, MD;  [facility];  Service: Endoscopy;;   RADIAL ARTERY HARVEST Left 09/19/2016   Procedure: RADIAL ARTERY HARVEST;  Surgeon: [name], MD;  Location: [place];  Service: Open Heart Surgery;  Laterality: Left;   RIGHT/LEFT HEART CATH AND CORONARY/GRAFT ANGIOGRAPHY N/A 05/28/2021   Procedure: RIGHT/LEFT HEART CATH AND CORONARY/GRAFT ANGIOGRAPHY;  Surgeon: Burnell Blanks, MD;  Location: [place] CV LAB;  Service: Cardiovascular;  Laterality: N/A;   TEE WITHOUT CARDIOVERSION N/A 09/19/2016   Procedure: TRANSESOPHAGEAL ECHOCARDIOGRAM (TEE);  Surgeon: [name], MD;  Location: [place];  Service: Open Heart Surgery;  Laterality: N/A;   TEE WITHOUT CARDIOVERSION N/A 08/10/2021   Procedure: TRANSESOPHAGEAL ECHOCARDIOGRAM (TEE);  Surgeon: Burnell Blanks, MD;  Location: [place];  Service: Open Heart Surgery;  Laterality: N/A;   TRANSCATHETER AORTIC VALVE REPLACEMENT, TRANSAPICAL N/A 08/10/2021   Procedure: Transcatheter Aortic Valve Replacement-Transapical;   Surgeon: Burnell Blanks, MD;  Location: [place];  Service: Open Heart Surgery;  Laterality: N/A;   TRANSURETHRAL RESECTION BLADDER TUMOR and bilateral RPG's  10/07/2020   [facility] Medications    Prior to Admission medications   Medication Sig Start Date End Date Taking? Authorizing Provider  acetaminophen (TYLENOL) 325 MG tablet Take 2 tablets (650 mg total) by mouth every 6 (six) hours as needed for mild pain. 09/23/16   Nani Skillern, PA-C  allopurinol (ZYLOPRIM) 100 MG tablet Take 100 mg by mouth daily.  05/03/19   [provider]  amLODipine (NORVASC) 5 MG tablet Take 1 tablet (5 mg total) by mouth daily. 09/17/21   Eileen Stanford, PA-C  atorvastatin (LIPITOR) 80 MG tablet Take 80 mg by mouth daily.    [provider]  carvedilol (COREG) 6.25 MG tablet Take 1 tablet (6.25 mg total) by mouth 2 (two) times daily. 05/29/21 09/17/21  Arrien, Jimmy Picket, MD  clopidogrel (PLAVIX) 75 MG tablet Take 75 mg by mouth daily.    [provider]  finasteride (PROSCAR) 5 MG tablet Take 5 mg by mouth daily.    [provider]  folic acid (FOLVITE) 1 MG tablet Take 1 mg by mouth daily.    [provider]  furosemide (LASIX) 80 MG tablet Take 1 tablet (80 mg total) by mouth every Tuesday, Thursday, Saturday, and Sunday. 09/05/20   Orson Eva, MD  isosorbide mononitrate (IMDUR) 120 MG 24 hr tablet Take 1 tablet (120 mg total) by mouth daily. 05/18/19   Roxan Hockey, MD  levothyroxine (SYNTHROID, LEVOTHROID) 50 MCG tablet Take 50 mcg by mouth daily before breakfast.    [provider]  lidocaine-prilocaine (EMLA) cream Apply 1 application. topically every Monday, Wednesday, and Friday with hemodialysis.    [provider]  nitroGLYCERIN (NITROSTAT) 0.4 MG SL tablet Place 0.4 mg under the tongue every 5 (five) minutes as needed for chest pain (max 3 doses).    [provider]  Omega-3 Fatty Acids (RA  FISH OIL) 1000 MG CAPS Take 2,000 mg by mouth 2 (two) times daily.    [provider]  omeprazole (PRILOSEC) 40 MG capsule Take 40 mg by mouth daily. 02/14/20   [provider]    Family History Family History  Problem Relation Age of Onset   Heart attack Mother    CVA Mother    CVA Father    Heart attack Brother    Colon cancer Neg Hx    Colon polyps Neg Hx     Social History Social History   Tobacco Use   Smoking status: Former    Packs/day: 1.00    Years: 40.00    Total pack years: 40.00    Types: Cigarettes  Start date: 02/20/1959    Quit date: 09/13/2016    Years since quitting: 5.1   Smokeless tobacco: Never  Vaping Use   Vaping Use: Never used  Substance Use Topics   Alcohol use: No    Alcohol/week: 0.0 standard drinks of alcohol    Comment: "No, not really"   Drug use: No     Allergies   Cardizem cd [diltiazem hcl er beads]   Review of Systems Review of Systems Per HPI  Physical Exam Triage Vital Signs ED Triage Vitals  Enc Vitals Group     BP 11/23/21 1220 126/64     Pulse Rate 11/23/21 1220 66     Resp 11/23/21 1220 18     Temp 11/23/21 1220 98 F (36.7 C)     Temp Source 11/23/21 1220 Oral     SpO2 11/23/21 1220 95 %     Weight --      Height --      Head Circumference --      Peak Flow --      Pain Score 11/23/21 1219 0     Pain Loc --      Pain Edu? --      Excl. in [place]? --    No data found.  Updated Vital Signs BP 126/64 (BP Location: Right Arm)   Pulse 66   Temp 98 F (36.7 C) (Oral)   Resp 18   SpO2 95%   Visual Acuity Right Eye Distance:   Left Eye Distance:   Bilateral Distance:    Right Eye Near:   Left Eye Near:    Bilateral Near:     Physical Exam   UC Treatments / Results  Labs (all labs ordered are listed, but only abnormal results are displayed) Labs Reviewed - No data to display  EKG   Radiology No results found.  Procedures Procedures (including critical care  time)  Medications Ordered in UC Medications - No data to display  Initial Impression / Assessment and Plan / UC Course  I have reviewed the triage vital signs and the nursing notes.  Pertinent labs & imaging results that were available during my care of the patient were reviewed by me and considered in my medical decision making (see chart for details).    Patient is a very pleasant, well-appearing 76 year old male presenting for acute on chronic diarrhea.  We will check for infectious causes with GI pathogen, C. difficile stool testing.  However, this is low suspicion given chronicity and well-appearing nature.  Instead, I suspect this may be related to recently increasing intake of artificial sweetener.  I encouraged him to cut back on the intake of artificial sweetener.  Blood work canceled secondary to hard stick.  Encourage close follow-up with primary care provider if symptoms persist or worsen despite cutting back on artificial sweeteners.  The patient was given the opportunity to ask questions.  All questions answered to their satisfaction.  The patient is in agreement to this plan.   Final Clinical Impressions(s) / UC Diagnoses   Final diagnoses:  Diarrhea, unspecified type     Discharge Instructions      - We were unable to draw your blood today; we will do the stool testing first and can check the blood work later if diarrhea continues - Please decrease the amount of cool aid you drink and try to avoid artificial sweeteners as these can be natural laxatives - Please return the stool testing to [place] when you  are able to and we will check for infectious causes for the diarrhea     ED Prescriptions   None    PDMP not reviewed this encounter.   Eulogio Bear, NP 11/23/21 1335

## 2021-11-23 NOTE — ED Triage Notes (Signed)
Pt reports on and off diarrhea x 2-3 months. States in the pass week more frequently episodes of diarrhea, around 5 times. Pepto Bismol gives some relief.

## 2021-11-24 ENCOUNTER — Other Ambulatory Visit: Payer: Self-pay

## 2021-11-24 ENCOUNTER — Telehealth: Payer: Self-pay | Admitting: Nurse Practitioner

## 2021-11-24 DIAGNOSIS — R197 Diarrhea, unspecified: Secondary | ICD-10-CM

## 2021-11-24 LAB — C DIFFICILE QUICK SCREEN W PCR REFLEX
C Diff antigen: NEGATIVE
C Diff interpretation: NOT DETECTED
C Diff toxin: NEGATIVE

## 2021-11-24 NOTE — Telephone Encounter (Signed)
Reordering stool testing

## 2021-11-25 LAB — GASTROINTESTINAL PANEL BY PCR, STOOL (REPLACES STOOL CULTURE)

## 2022-01-05 ENCOUNTER — Ambulatory Visit: Payer: Self-pay

## 2022-01-05 NOTE — Patient Outreach (Signed)
  Care Coordination   01/05/2022 Name: PRATT BRESS MRN: [id number] DOB: [birth year]   Care Coordination Outreach Attempts:  An unsuccessful telephone outreach was attempted today to offer the patient information about available care coordination services as a benefit of their health plan.   Follow Up Plan:  Additional outreach attempts will be made to offer the patient care coordination information and services.   Encounter Outcome:  No Answer  Care Coordination Interventions Activated:  No   Care Coordination Interventions:  No, not indicated    [facility] [phone]

## 2022-02-24 ENCOUNTER — Telehealth: Payer: Self-pay

## 2022-03-30 ENCOUNTER — Other Ambulatory Visit: Payer: Self-pay | Admitting: *Deleted

## 2022-03-30 DIAGNOSIS — I739 Peripheral vascular disease, unspecified: Secondary | ICD-10-CM

## 2022-04-13 ENCOUNTER — Ambulatory Visit: Payer: Self-pay | Admitting: Vascular Surgery

## 2022-04-15 IMAGING — CT CT ABD-PELV W/O CM
2 of 4 series · 15 of 46 positions shown, 17 images · non-contrast
Comparison: None.

CLINICAL DATA: Possible abscess/ infection Nausea/ vomitting

EXAM:
CT ABDOMEN AND PELVIS WITHOUT CONTRAST
TECHNIQUE: Multidetector CT imaging of the abdomen and pelvis was performed
following the standard protocol without IV contrast.

[Series 2: axial st · axial · 0.80mm/px · z∈[+1095,+1515]mm · 12 of 96 slices shown, 14 images]
[im 6/96  soft-tissue]
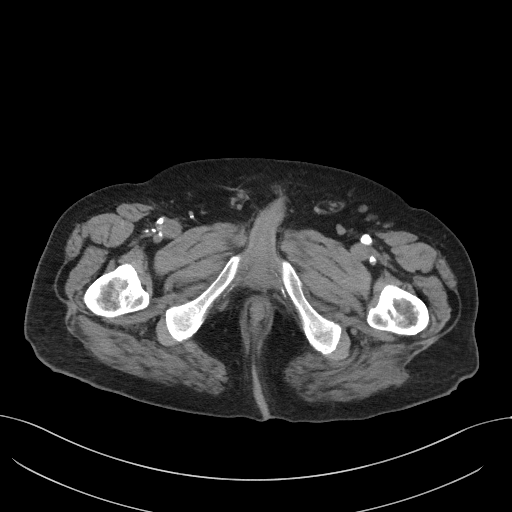
[im 6/96  bone]
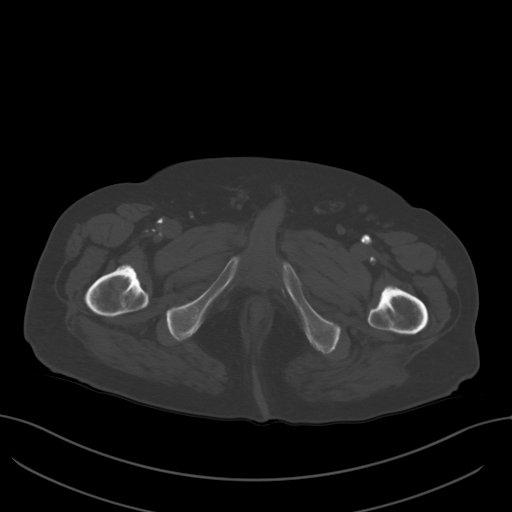
[im 16/96  soft-tissue]
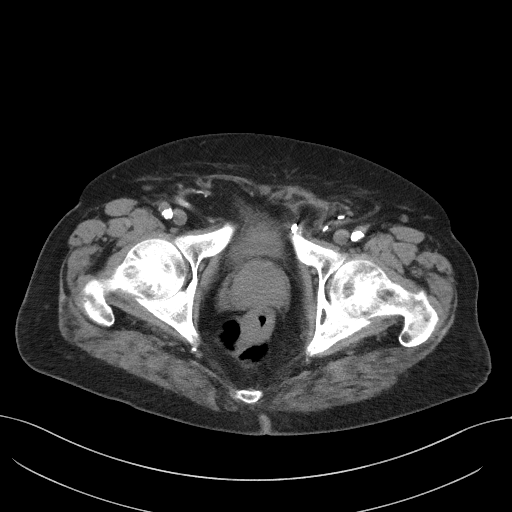
[im 22/96  soft-tissue]
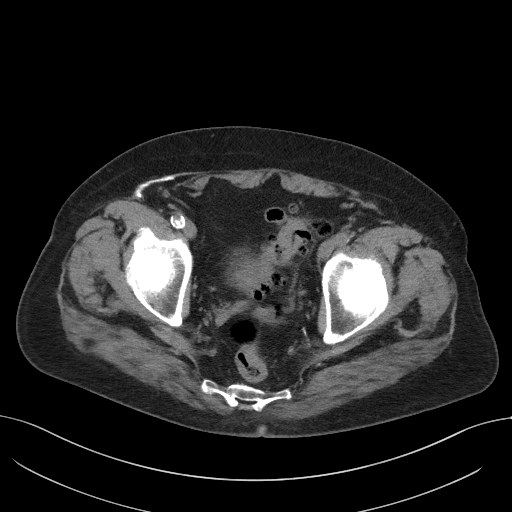
[im 27/96  soft-tissue]
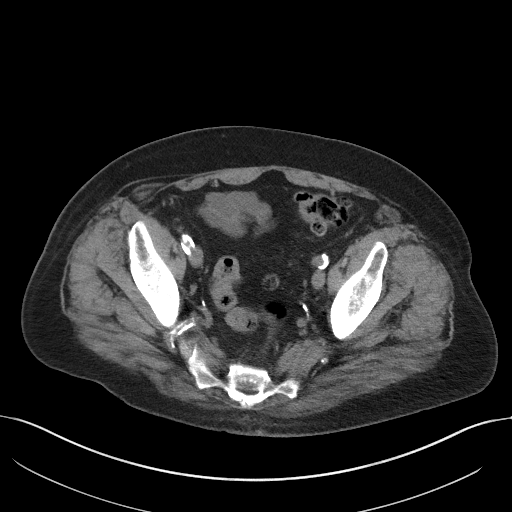
[im 37/96  soft-tissue]
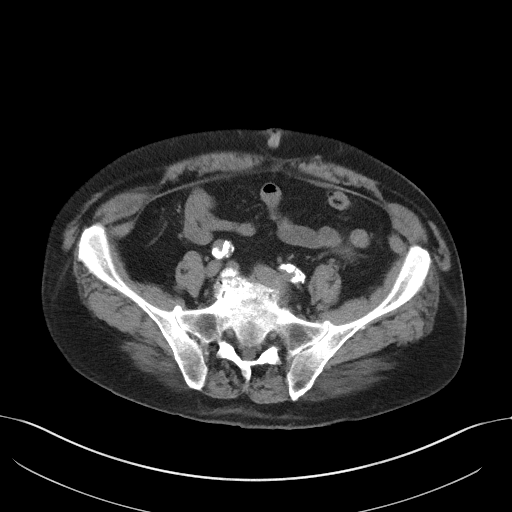
[im 43/96  soft-tissue]
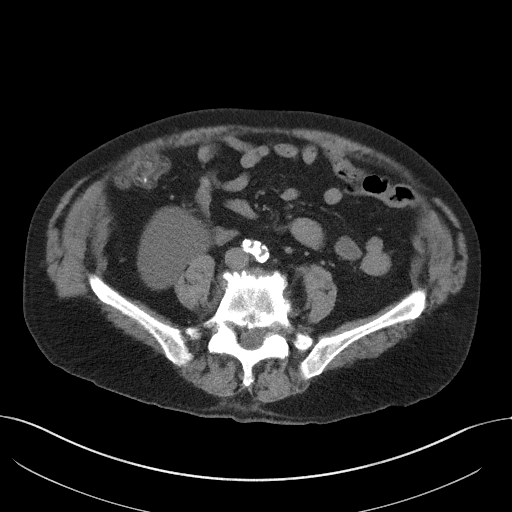
[im 53/96  soft-tissue]
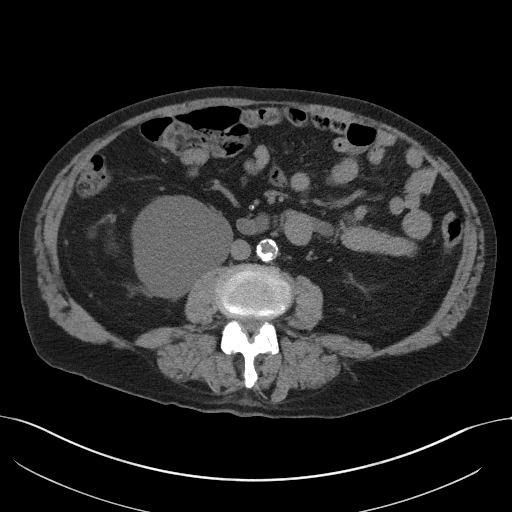
[im 59/96  soft-tissue]
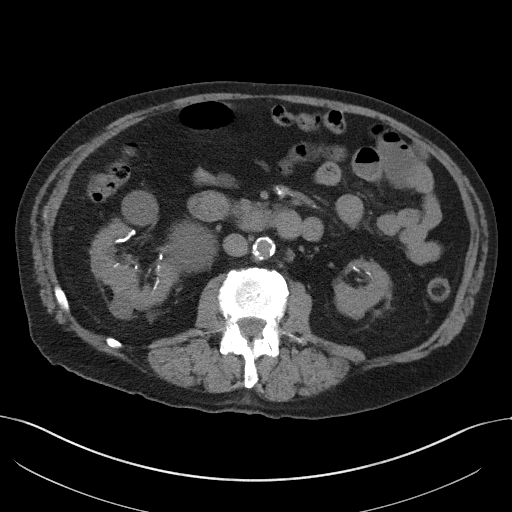
[im 69/96  soft-tissue]
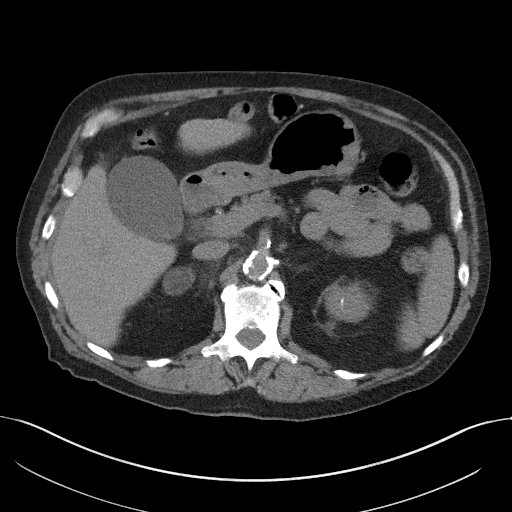
[im 69/96  bone]
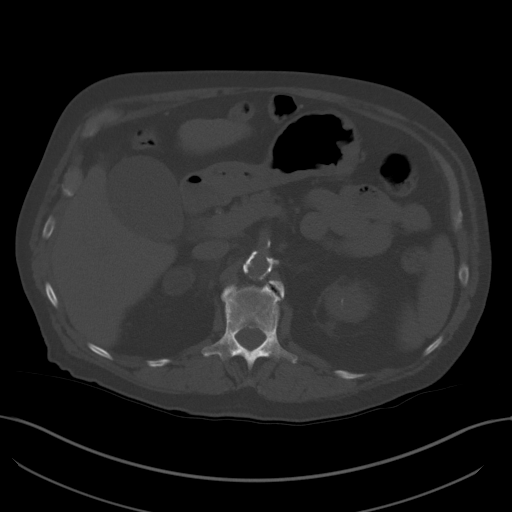
[im 74/96  soft-tissue]
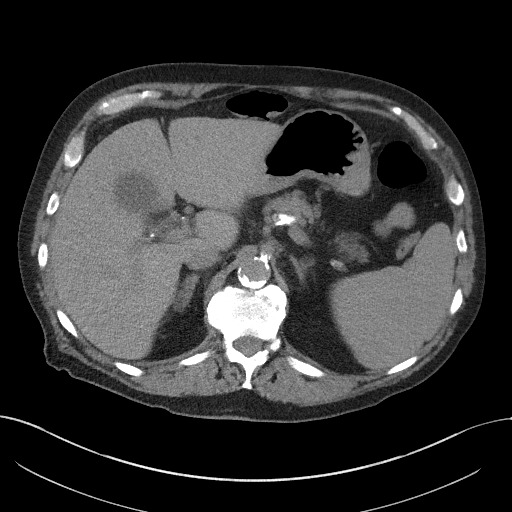
[im 80/96  soft-tissue]
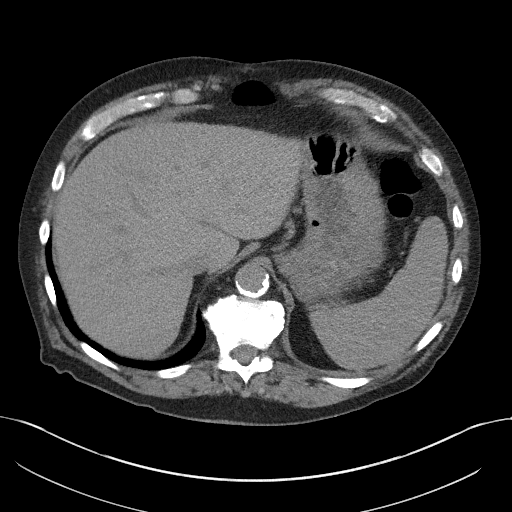
[im 90/96  soft-tissue]
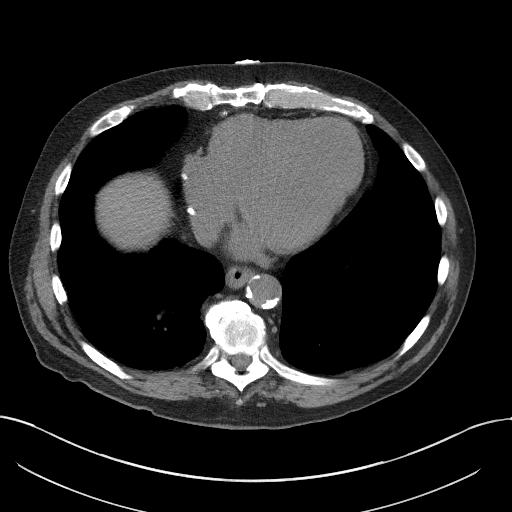

[Series 5: coronal st · coronal · 0.86mm/px · 3 of 103 slices shown]
[im 35/103  soft-tissue]
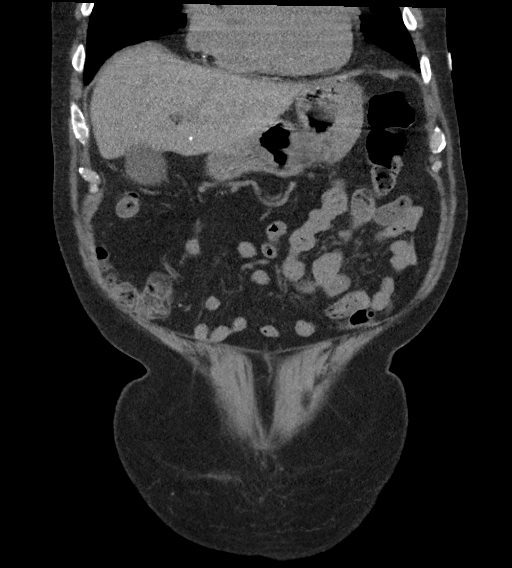
[im 46/103  soft-tissue]
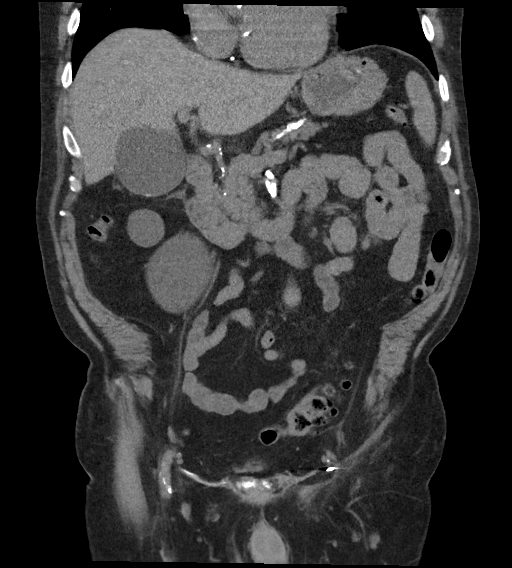
[im 57/103  soft-tissue]
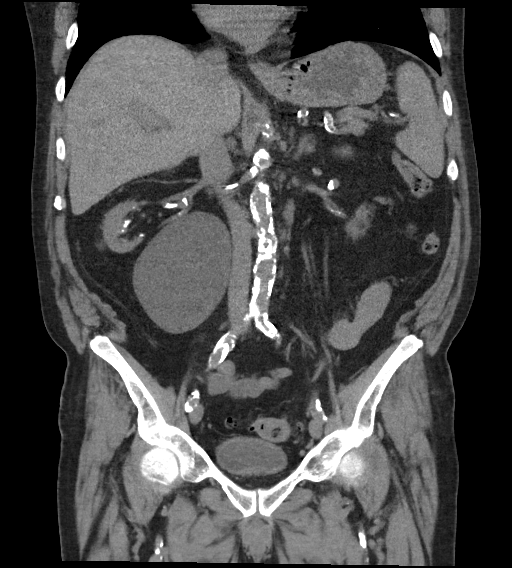

[15 of 46 positions shown; findings below may reference images not displayed]

FINDINGS: Lower chest: No acute abnormality. Aortic valve leaflet
calcifications. Coronary artery calcifications. Sternotomy wires
noted.

Hepatobiliary: No focal liver abnormality. Calcified gallstone noted
within the gallbladder lumen. Question pericholecystic fluid. Slight
haziness of the gallbladder wall. No definite gallbladder wall
thickening no biliary dilatation.

Pancreas: No focal lesion. Normal pancreatic contour. No surrounding
inflammatory changes. No main pancreatic ductal dilatation.

Spleen: Normal in size without focal abnormality.

Adrenals/Urinary Tract:

There is a 2.4 cm right adrenal gland nodule with a density of -10
Hounsfield units consistent with nodule adenoma.

Bilateral renal atrophy. Multiple fluid density lesions likely
represent simple renal cysts with the largest on the right measuring
up to 8.3 cm. Subcentimeter densities are too small to characterize.
There is a 1.3 cm lesion with a density of 26 Hounsfield units that
is indeterminate ([DATE]) in the left kidney.

Calcifications associated with kidneys appear to be vascular. No
nephrolithiasis and no hydronephrosis. No definite contour-deforming
renal mass.

No ureterolithiasis or hydroureter.

The urinary bladder is unremarkable.

Stomach/Bowel: Stomach is within normal limits. No evidence of bowel
wall thickening or dilatation. Scattered colonic diverticulosis. The
appendix not definitely identified.

Vascular/Lymphatic: No abdominal aorta or iliac aneurysm. Severe
atherosclerotic plaque of the aorta and its branches. No abdominal,
pelvic, or inguinal lymphadenopathy.

Reproductive: Prostate is unremarkable.

Other: No intraperitoneal free fluid. No intraperitoneal free gas.
No organized fluid collection.

Musculoskeletal:

Status post right repair with. Likely left repair. No definite
inguinal hernia repair.

No suspicious lytic or blastic osseous lesions. No acute displaced
fracture. Multilevel degenerative changes of the spine.
IMPRESSION: 1. Cholelithiasis with question pericholecystic fluid and slight
haziness of the gallbladder wall. Recommend right upper quadrant
ultrasound for a more sensitive evaluation of the gallbladder.
2. Indeterminate 1.3 cm left renal lesion. Recommend nonemergent MRI
renal protocol for further evaluation. When the patient is
clinically stable and able to follow directions and hold their
breath (preferably as an outpatient) further evaluation with
dedicated abdominal MRI should be considered.
3. Colonic diverticulosis with no acute diverticulitis.
4. A 2.4 cm right adrenal gland adenoma.
5.  Aortic Atherosclerosis ([id number])-severe.

## 2022-04-15 IMAGING — DX DG CHEST 2V
2 series · 2 of 2 positions shown · non-contrast
Comparison: 08/30/2020

CLINICAL DATA: Chest pain for 45 minutes, central chest pain and
epigastric pain

EXAM:
CHEST - 2 VIEW

[chest pa]
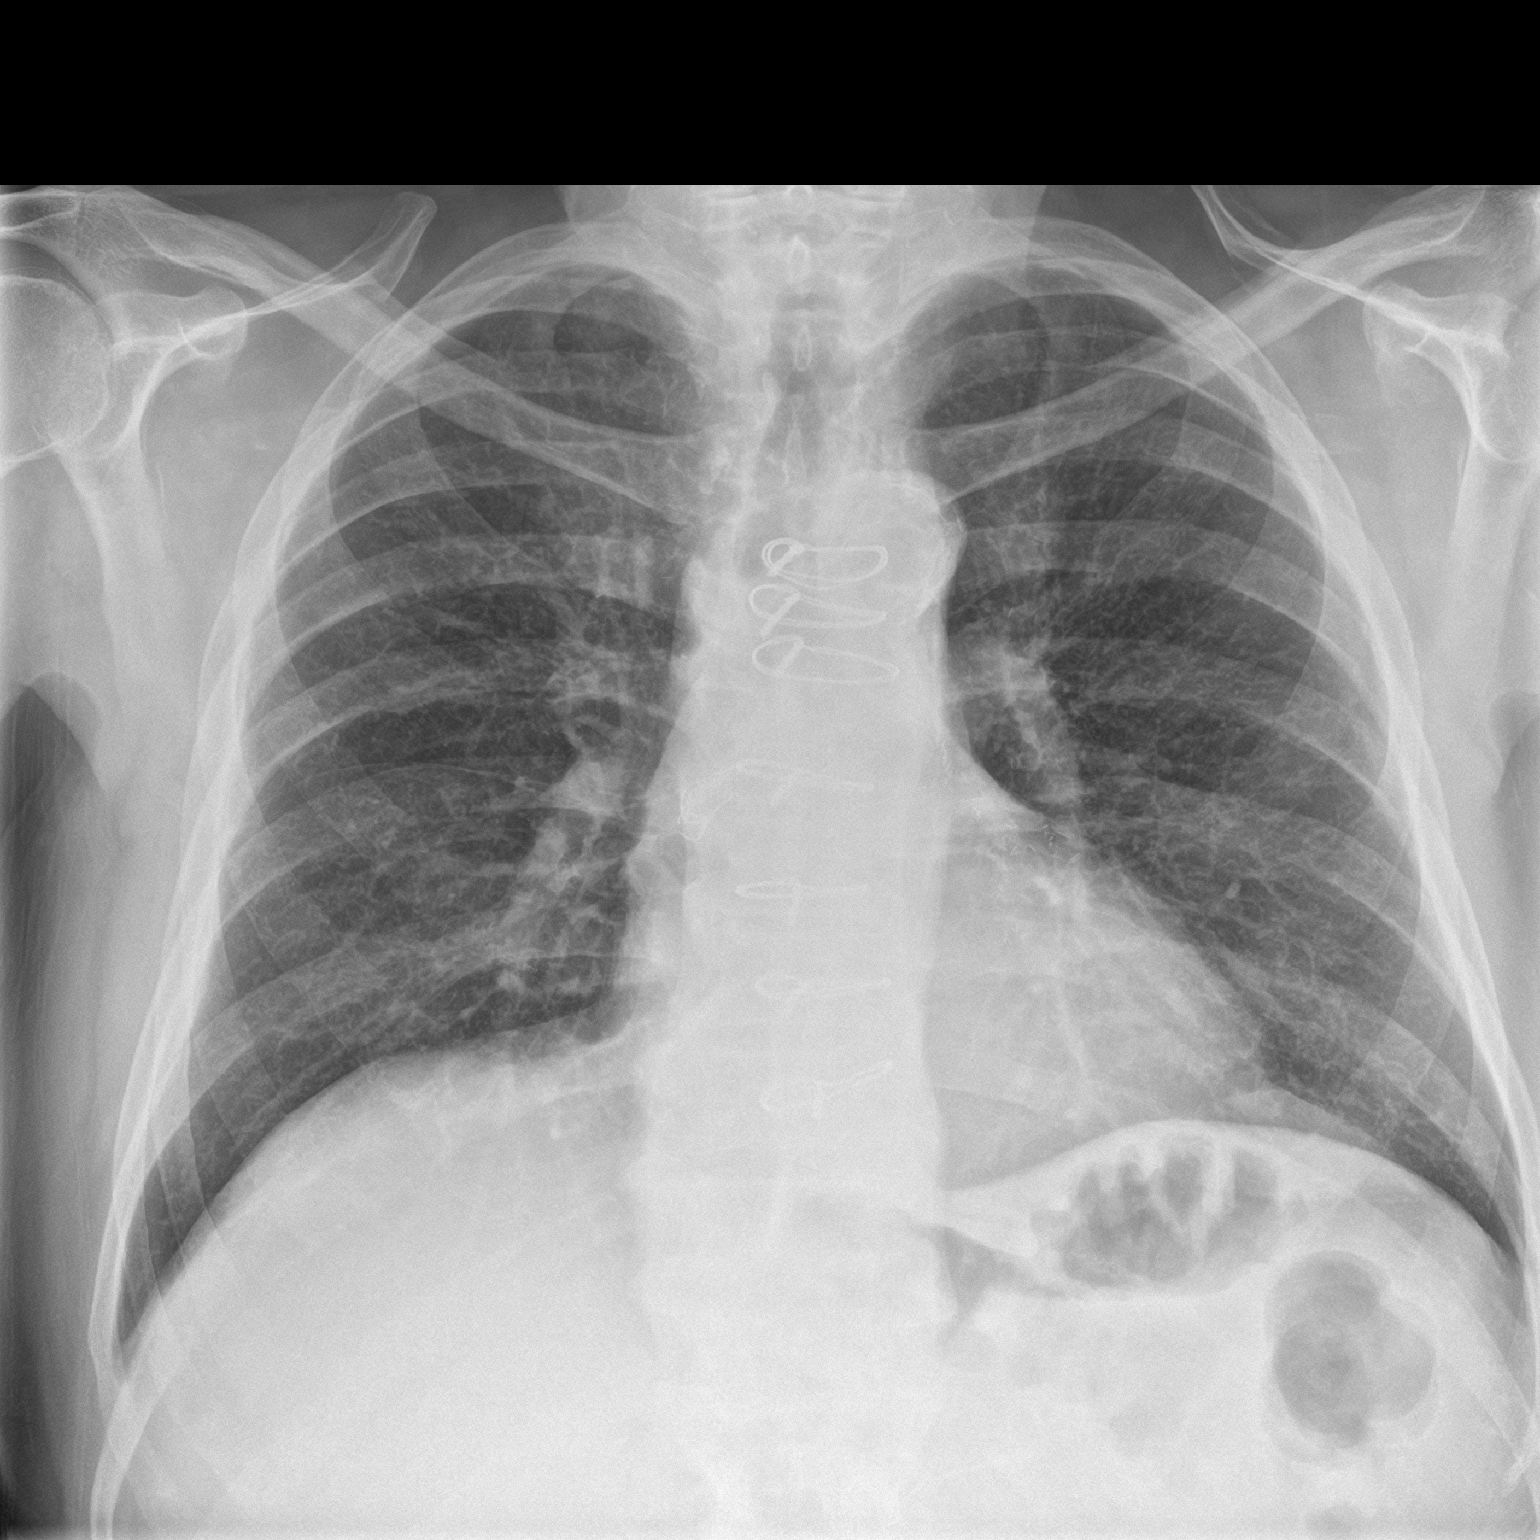

[chest lat]
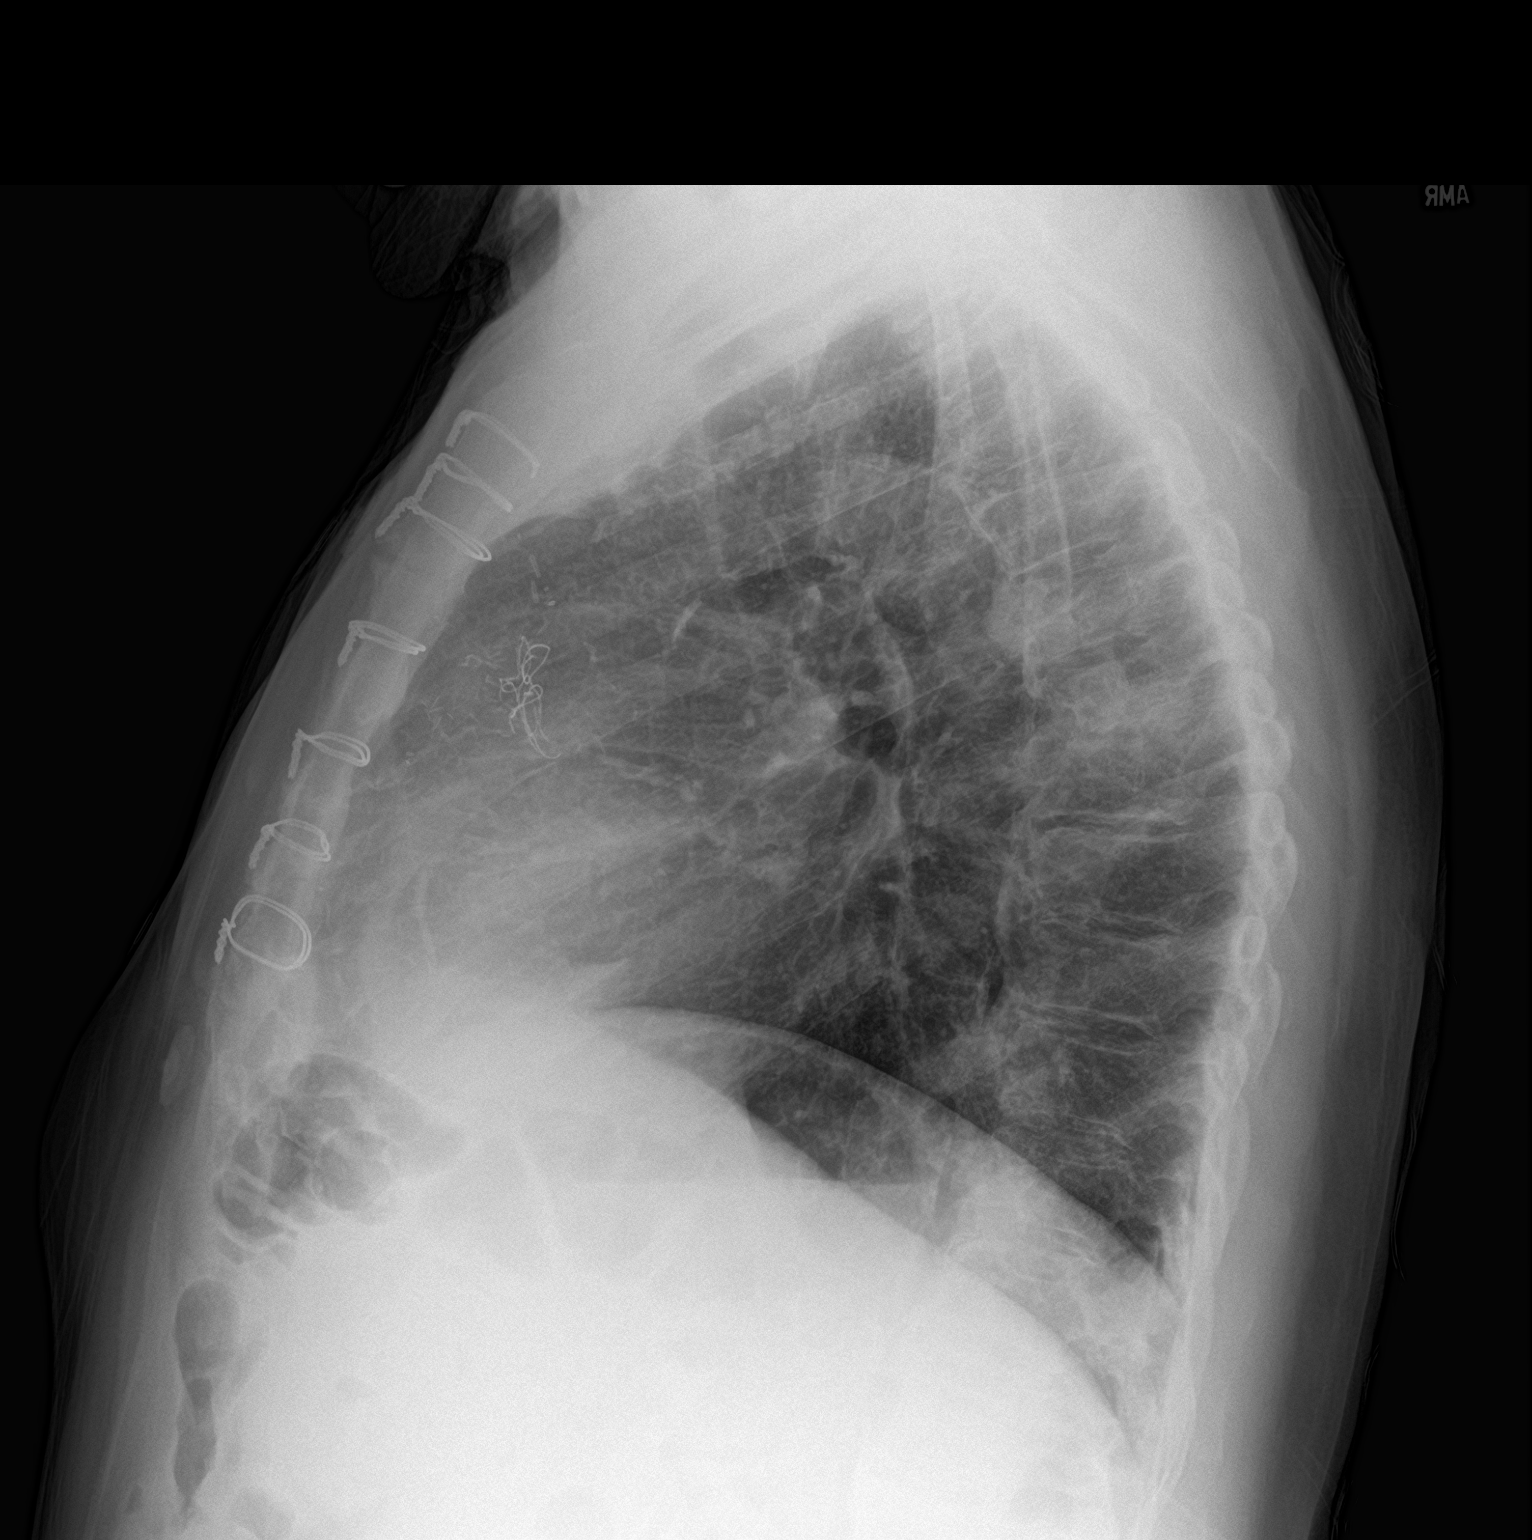

[2 of 2 positions shown; findings below may reference images not displayed]

FINDINGS: Frontal and lateral views of the chest demonstrate postsurgical
changes from prior median sternotomy. The cardiac silhouette is
unremarkable. No acute airspace disease, effusion, or pneumothorax.
No acute bony abnormalities.
IMPRESSION: 1. No acute intrathoracic process.

## 2022-04-20 ENCOUNTER — Ambulatory Visit (INDEPENDENT_AMBULATORY_CARE_PROVIDER_SITE_OTHER): Payer: No Typology Code available for payment source | Admitting: Vascular Surgery

## 2022-04-20 ENCOUNTER — Ambulatory Visit (INDEPENDENT_AMBULATORY_CARE_PROVIDER_SITE_OTHER): Payer: No Typology Code available for payment source

## 2022-04-20 ENCOUNTER — Encounter: Payer: Self-pay | Admitting: Vascular Surgery

## 2022-04-20 VITALS — BP 135/74 | HR 71 | Temp 99.8°F | Ht 69.0 in | Wt 187.2 lb

## 2022-04-20 DIAGNOSIS — I739 Peripheral vascular disease, unspecified: Secondary | ICD-10-CM | POA: Diagnosis not present

## 2022-04-20 NOTE — Progress Notes (Addendum)
[facility] [address]  Patient name: [name] MRN: [id number] DOB: 1945-08-02 Sex: male  REASON FOR VISIT: Discuss lower extremity arterial insufficiency with limiting bilateral claudication  HPI: [name] is a 76 y.o. male here today for evaluation of lower extremity arterial insufficiency.  He is here today with his wife.  He is known to me from prior right upper arm AV fistula creation in September 2021.  He currently undergoes dialysis at [facility] in [place].  He has had progressively severe bilateral limiting claudication.  This is worse on his left than his right.  He underwent evaluation at the [facility] and was felt not to be a candidate for stenting due to severe femoral occlusive disease and is seeing me for further evaluation.  He denies any rest pain and does not have any tissue loss.  He did recently undergo TAVR and has had a very dramatic symptom improvement with energy and respiratory effort.  He remains quite active at his age of 44.  Past Medical History:  Diagnosis Date   Acute myocardial infarction of other inferior wall, initial episode of care    Anemia    hx of bld transfusion   Anxiety    Arthritis    Cancer ([place])    bladder 2013 removed, has cystoscopy 10/2016, has returned x 2   Chronic kidney disease    dialysis - M-W-F at [facility] in [place]   COPD (chronic obstructive pulmonary disease) ([place])    patient is not aware of this dx   Coronary artery disease    Artery bypass graft Jan 2008   Dialysis patient [facility])    GERD (gastroesophageal reflux disease)    Hearing loss    wears hearing aids   History of blood transfusion 2017   History of hiatal hernia    Hyperlipidemia    Hypertension    Hypothyroidism    PONV (postoperative nausea and vomiting)    only with TURP surgery, no other problems with the other surgeries   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 MAngelena Formand Dr. BCyndia Bent  Severe aortic stenosis    Sleep apnea    uses CPAP nightly   [place]roke ([place]    Tobacco user    quit 09/2016    Family History  Problem Relation Age of Onset   Heart attack Mother    CVA Mother    CVA Father    Heart attack Brother    Colon cancer Neg Hx    Colon polyps Neg Hx     SOCIAL HISTORY: Social History   Tobacco Use   Smoking status: Former    Packs/day: 1.00    Years: 40.00    Total pack years: 40.00    Types: Cigarettes    Start date: 02/20/1959    Quit date: 09/13/2016    Years since quitting: 5.6   Smokeless tobacco: Never  Substance Use Topics   Alcohol use: No    Alcohol/week: 0.0 standard drinks of alcohol    Comment: "No, not really"    Allergies  Allergen Reactions   Cardizem Cd [Diltiazem Hcl Er Beads] Palpitations    "Makes heart skip"    Current Outpatient Medications  Medication Sig Dispense Refill   acetaminophen (TYLENOL) 325 MG tablet Take 2 tablets (650 mg total) by mouth every 6 (six) hours as needed for mild pain.     allopurinol (ZYLOPRIM)  100 MG tablet Take 100 mg by mouth daily.      atorvastatin (LIPITOR) 80 MG tablet Take 80 mg by mouth daily.     carvedilol (COREG) 6.25 MG tablet Take 1 tablet (6.25 mg total) by mouth 2 (two) times daily. (Patient taking differently: Take 6.25 mg by mouth 2 (two) times daily. Take 1/2 tablet BID) 60 tablet 0   clopidogrel (PLAVIX) 75 MG tablet Take 75 mg by mouth daily.     finasteride (PROSCAR) 5 MG tablet Take 5 mg by mouth daily.     folic acid (FOLVITE) 1 MG tablet Take 1 mg by mouth daily.     furosemide (LASIX) 80 MG tablet Take 1 tablet (80 mg total) by mouth every Tuesday, Thursday, Saturday, and Sunday.     levothyroxine (SYNTHROID, LEVOTHROID) 50 MCG tablet Take 50 mcg by mouth daily before breakfast.     lidocaine-prilocaine (EMLA) cream Apply 1 application. topically every Monday, Wednesday, and Friday with hemodialysis.     nitroGLYCERIN  (NITROSTAT) 0.4 MG SL tablet Place 0.4 mg under the tongue every 5 (five) minutes as needed for chest pain (max 3 doses).     Omega-3 Fatty Acids (RA FISH OIL) 1000 MG CAPS Take 2,000 mg by mouth 2 (two) times daily.     omeprazole (PRILOSEC) 40 MG capsule Take 40 mg by mouth daily.     No current facility-administered medications for this visit.    REVIEW OF SYSTEMS:  '[X]'$  denotes positive finding, '[ ]'$  denotes negative finding Cardiac  Comments:  Chest pain or chest pressure:    Shortness of breath upon exertion:    Short of breath when lying flat:    Irregular heart rhythm:        Vascular    Pain in calf, thigh, or hip brought on by ambulation: x   Pain in feet at night that wakes you up from your sleep:     Blood clot in your veins:    Leg swelling:           PHYSICAL EXAM: Vitals:   04/20/22 1506  BP: 135/74  Pulse: 71  Temp: 99.8 F (37.7 C)  SpO2: 96%  Weight: 187 lb 3.2 oz (84.9 kg)  Height: '5\' 9"'$  (1.753 m)    GENERAL: The patient is a well-nourished male, in no acute distress. The vital signs are documented above. CARDIOVASCULAR: Palpable thrill in his right upper arm AV fistula.  Palpable radial pulses bilaterally.  I do not palpate a left femoral pulse and no pulses distally.  He does have a palpable right femoral pulse and absent popliteal and distal pulses. PULMONARY: There is good air exchange  MUSCULOSKELETAL: There are no major deformities or cyanosis. NEUROLOGIC: No focal weakness or paresthesias are detected. SKIN: There are no ulcers or rashes noted. PSYCHIATRIC: The patient has a normal affect.  DATA:  I have a CT scan in the [facility] system from January 2023 for his pre-TAVR evaluation.  I reviewed these images with the patient and his wife.  This does show extensive calcified disease in his aortoiliac segments.  There was no flow limitation on his aorta at this study from January 2023.  He does have complete occlusion of his common femoral artery on the  left with reconstitution of the proximal superficial femoral artery.  There are no images below this level.  On the right, he does have disease but no flow-limiting stenosis throughout his common and external iliac artery.  He does have  severe stenosis in the right common femoral artery but no complete occlusion.  His proximal superficial femoral artery is patent.  He did undergo CT of his aorta and bilateral runoff at the [place] from October.  Unfortunately I have incomplete fax of this results.  I do not have the results of his femoral and runoff study.  There was no flow limitation in his aorta by this study.  MEDICAL ISSUES: Limiting claudication with no evidence of critical limb ischemia.  I discussed the option of observation only.  He is quite limited by this.  In reviewing his images from 1 year ago it appears as though he would be treated with femoral endarterectomy alone.  We will obtain his actual films from the [facility] of his October aortogram with bilateral runoff.  We will contact him by telephone following this for further recommendation.    Rosetta Posner, MD FACS [facility] of [facility] [phone]  Note: Portions of this report may have been transcribed using voice recognition software.  Every effort has been made to ensure accuracy; however, inadvertent computerized transcription errors may still be present.  Addendum: Attending [facility] CT [facility] and discussed the films with the patient.  This does show left common femoral artery occlusion with reconstitution of diseased profunda femoris artery and occlusion of the superficial femoral artery.  On the right, the patient has a subtotal occlusion of the common femoral artery with a widely patent profundofemoral artery and diseased superficial femoral artery.  I discussed the options of revascularization with the patient.  He currently reports that he is able to  tolerate his level of claudication.  He knows to notify us should he develop any worsening claudication symptoms or tissue loss.  He would be a candidate for right common femoral endarterectomy.  On the left his profunda is not as healthy as on the right but still feel that he would have adequate symptom relief with endarterectomy alone.  Understands and will notify us if he wishes to explore surgical options

## 2022-04-21 ENCOUNTER — Ambulatory Visit (INDEPENDENT_AMBULATORY_CARE_PROVIDER_SITE_OTHER): Payer: No Typology Code available for payment source | Admitting: Internal Medicine

## 2022-04-21 ENCOUNTER — Encounter: Payer: Self-pay | Admitting: Internal Medicine

## 2022-04-21 VITALS — BP 132/84 | HR 66 | Temp 98.2°F | Ht 69.0 in | Wt 190.4 lb

## 2022-04-21 DIAGNOSIS — R918 Other nonspecific abnormal finding of lung field: Secondary | ICD-10-CM

## 2022-04-21 NOTE — Progress Notes (Signed)
[name], male    DOB: Nov 18, 1945    MRN: [id number]   Brief patient profile:  46   yow[facility] HD pt quit smoking in 2018  referred to pulmonary clinic in [place]  04/21/2022 by [place]  for  abn ct with last CTchest 06/15/21 @ [facility] = clusters largest 5 mm      History of Present Illness  04/21/2022  Pulmonary/ 1st office eval/ [facility] Word / [place] Office  Chief Complaint  Patient presents with   Consult    CT done at [facility] hx of pul nodule COPD former smoker stopped in 2018    Dyspnea:  limited knees > sob  Cough: none  Sleep: cpap and flat bed 2 pillows  SABA use: none  02: none  Covid vax  max/ tested pos once  Has had localized bladder Ca followed at [place] and no recurrence as of 3 m prior to OV    No obvious day to day or daytime pattern/variability or assoc excess/ purulent sputum or mucus plugs or hemoptysis or cp or chest tightness, subjective wheeze or overt sinus or hb symptoms.   Sleeping  without nocturnal  or early am exacerbation  of respiratory  c/o's or need for noct saba. Also denies any obvious fluctuation of symptoms with weather or environmental changes or other aggravating or alleviating factors except as outlined above   No unusual exposure hx or h/o childhood pna/ asthma or knowledge of premature birth.  Current Allergies, Complete Past Medical History, Past Surgical History, Family History, and Social History were reviewed in [employer] record.  ROS  The following are not active complaints unless bolded Hoarseness, sore throat, dysphagia, dental problems, itching, sneezing,  nasal congestion or discharge of excess mucus or purulent secretions, ear ache,   fever, chills, sweats, unintended wt loss or wt gain, classically pleuritic or exertional cp,  orthopnea pnd or arm/hand swelling  or leg swelling, presyncope, palpitations, abdominal pain, anorexia, nausea, vomiting, diarrhea  or change in bowel habits or change in bladder habits, change in  stools or change in urine, dysuria, hematuria,  rash, arthralgias mostly knees, fingers s gel phenomena , visual complaints, headache, numbness, weakness or ataxia or problems with walking or coordination,  change in mood or  memory.             Past Medical History:  Diagnosis Date   Acute myocardial infarction of other inferior wall, initial episode of care    Anemia    hx of bld transfusion   Anxiety    Arthritis    Cancer ([place])    bladder 2013 removed, has cystoscopy 10/2016, has returned x 2   Chronic kidney disease    dialysis - M-W-F at [facility] in [place]   COPD (chronic obstructive pulmonary disease) ([place])    patient is not aware of this dx   Coronary artery disease    Artery bypass graft Jan 2008   Dialysis patient [facility])    GERD (gastroesophageal reflux disease)    Hearing loss    wears hearing aids   History of blood transfusion 2017   History of hiatal hernia    Hyperlipidemia    Hypertension    Hypothyroidism    PONV (postoperative nausea and vomiting)    only with TURP surgery, no other problems with the other surgeries   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 MAngelena Formand Dr. BCyndia Bent  Severe  aortic stenosis    Sleep apnea    uses CPAP nightly   Stroke [facility])    Tobacco user    quit 09/2016    Outpatient Medications Prior to Visit  Medication Sig Dispense Refill   acetaminophen (TYLENOL) 325 MG tablet Take 2 tablets (650 mg total) by mouth every 6 (six) hours as needed for mild pain.     allopurinol (ZYLOPRIM) 100 MG tablet Take 100 mg by mouth daily.      atorvastatin (LIPITOR) 80 MG tablet Take 80 mg by mouth daily.     clopidogrel (PLAVIX) 75 MG tablet Take 75 mg by mouth daily.     finasteride (PROSCAR) 5 MG tablet Take 5 mg by mouth daily.     folic acid (FOLVITE) 1 MG tablet Take 1 mg by mouth daily.     furosemide (LASIX) 80 MG tablet Take 1 tablet (80 mg total) by mouth every Tuesday, Thursday,  Saturday, and Sunday.     levothyroxine (SYNTHROID, LEVOTHROID) 50 MCG tablet Take 50 mcg by mouth daily before breakfast.     lidocaine-prilocaine (EMLA) cream Apply 1 application. topically every Monday, Wednesday, and Friday with hemodialysis.     nitroGLYCERIN (NITROSTAT) 0.4 MG SL tablet Place 0.4 mg under the tongue every 5 (five) minutes as needed for chest pain (max 3 doses).     Omega-3 Fatty Acids (RA FISH OIL) 1000 MG CAPS Take 2,000 mg by mouth 2 (two) times daily.     omeprazole (PRILOSEC) 40 MG capsule Take 40 mg by mouth daily.     carvedilol (COREG) 6.25 MG tablet Take 1 tablet (6.25 mg total) by mouth 2 (two) times daily. (Patient taking differently: Take 6.25 mg by mouth 2 (two) times daily. Take 1/2 tablet BID) 60 tablet 0   No facility-administered medications prior to visit.     Objective:     BP 132/84   Pulse 66   Temp 98.2 F (36.8 C) (Temporal)   Ht _0  (1.753 m)   Wt 190 lb 6.4 oz (86.4 kg)   SpO2 96% Comment: ra  BMI 28.12 kg/m   SpO2: 96 % (ra)  Amb wm nad    HEENT : Oropharynx  clear       NECK :  without  apparent JVD/ palpable Nodes/TM    LUNGS: no acc muscle use,  Nl contour chest which is clear to A and P bilaterally without cough on insp or exp maneuvers   CV:  RRR  no s3 or murmur or increase in P2, and no edema   ABD:  soft and nontender with nl inspiratory excursion in the supine position. No bruits or organomegaly appreciated   MS:  Nl gait/ ext warm without deformities Or obvious joint restrictions  calf tenderness, cyanosis or clubbing    SKIN: warm and dry without lesions    NEURO:  alert, approp, nl sensorium with  no motor or cerebellar deficits apparent.    I personally reviewed images and agree with radiology impression as follows:   Chest CTa chest 05/18/21  Mild centrilobular emphysema. Mild interlobular septal thickening primarily seen at the lung apices and mild ground-glass opacity of the left upper lobe. Small  solid clustered nodules of lingula. Largest is located on series 5, image 57 and measures 5 mm in mean diameter.    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 which is  an extremely common benign condition in the elderly and does not warrant aggressive eval/ rx at this point unless there is a clinical correlation suggesting unaddressed pulmonary infection (purulent sputum, night sweats, unintended wt loss, doe) or evolution of  obvious changes on either plane cxr or one particular nodule growing vs the others at > 1 cm given likelihood of false negatives in the borderline nodules and false positives in the larger ones (goal to be more selective about ct's and potential for bx than usually the case)   Discussed in detail all the  indications, usual  risks and alternatives  relative to the benefits with patient who agrees to proceed with w/u as outlined and f/u here in 3 m, sooner if needed          Each maintenance medication was reviewed in detail including emphasizing most importantly the difference between maintenance and prns and under what circumstances the prns are to be triggered using an action plan format where appropriate.  Total time for H and P, chart review, counseling,  and generating customized AVS unique to this office visit / same day charting = 45 min with pt new to me          Christinia Gully, MD 04/21/2022

## 2022-04-21 NOTE — Assessment & Plan Note (Addendum)
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 which is  an extremely common benign condition in the elderly and does not warrant aggressive eval/ rx at this point unless there is a clinical correlation suggesting unaddressed pulmonary infection (purulent sputum, night sweats, unintended wt loss, doe) or evolution of  obvious changes on either plane cxr or one particular nodule growing vs the others at > 1 cm given likelihood of false negatives in the borderline nodules and false positives in the larger ones (goal to be more selective about ct's and potential for bx than usually the case)   Discussed in detail all the  indications, usual  risks and alternatives  relative to the benefits with patient who agrees to proceed with w/u as outlined and f/u here in 3 m, sooner if needed          Each maintenance medication was reviewed in detail including emphasizing most importantly the difference between maintenance and prns and under what circumstances the prns are to be triggered using an action plan format where appropriate.  Total time for H and P, chart review, counseling,  and generating customized AVS unique to this office visit / same day charting = 45 min with pt new to me

## 2022-04-21 NOTE — Patient Instructions (Signed)
We can just follow nodules which are under 10 mm  (1.0 cm) in your case   Please remember to go to the lab department   for your tests - we will call you with the results when they are available.  Please schedule a follow up visit in 3 months but call sooner if needed- bring your CT chest report

## 2022-04-27 ENCOUNTER — Telehealth: Payer: Self-pay

## 2022-04-27 LAB — CBC WITH DIFFERENTIAL/PLATELET
Basophils Absolute: 0 10*3/uL (ref 0.0–0.2)
Basos: 1 %
EOS (ABSOLUTE): 0.1 10*3/uL (ref 0.0–0.4)
Eos: 3 %
Hematocrit: 30.8 % — ABNORMAL LOW (ref 37.5–51.0)
Hemoglobin: 10.7 g/dL — ABNORMAL LOW (ref 13.0–17.7)
Immature Grans (Abs): 0 10*3/uL (ref 0.0–0.1)
Immature Granulocytes: 0 %
Lymphocytes Absolute: 1.1 10*3/uL (ref 0.7–3.1)
Lymphs: 21 %
MCH: 30.8 pg (ref 26.6–33.0)
MCHC: 34.7 g/dL (ref 31.5–35.7)
MCV: 89 fL (ref 79–97)
Monocytes Absolute: 0.6 10*3/uL (ref 0.1–0.9)
Monocytes: 12 %
Neutrophils Absolute: 3.3 10*3/uL (ref 1.4–7.0)
Neutrophils: 63 %
Platelets: 157 10*3/uL (ref 150–450)
RBC: 3.47 x10E6/uL — ABNORMAL LOW (ref 4.14–5.80)
RDW: 15 % (ref 11.6–15.4)
WBC: 5.2 10*3/uL (ref 3.4–10.8)

## 2022-04-27 LAB — QUANTIFERON-TB GOLD PLUS
QuantiFERON Mitogen Value: >10 IU/mL
QuantiFERON Nil Value: 0.08 IU/mL
QuantiFERON TB1 Ag Value: 0.08 IU/mL
QuantiFERON TB2 Ag Value: 0.09 IU/mL
QuantiFERON-TB Gold Plus: NEGATIVE

## 2022-04-27 LAB — SEDIMENTATION RATE: Sed Rate: 43 mm/hr — ABNORMAL HIGH (ref 0–30)

## 2022-04-27 NOTE — Telephone Encounter (Signed)
-----   Message from Tanda Rockers, MD sent at 04/27/2022  5:45 AM EST ----- Call patient :  Studies are unremarkable x for chronic mild anemia no change from baseline

## 2022-04-27 NOTE — Telephone Encounter (Signed)
Called Pt to give results of blood work. Pt stated understanding and nothing further needed.

## 2022-04-29 ENCOUNTER — Encounter (HOSPITAL_COMMUNITY): Payer: Self-pay | Admitting: *Deleted

## 2022-04-29 ENCOUNTER — Emergency Department (HOSPITAL_COMMUNITY): Payer: No Typology Code available for payment source

## 2022-04-29 ENCOUNTER — Encounter (HOSPITAL_COMMUNITY): Payer: Self-pay

## 2022-04-29 ENCOUNTER — Other Ambulatory Visit: Payer: Self-pay

## 2022-04-29 ENCOUNTER — Emergency Department (HOSPITAL_COMMUNITY)
Admission: EM | Admit: 2022-04-29 | Discharge: 2022-04-29 | Disposition: A | Discharge status: Home / Self Care | Payer: No Typology Code available for payment source | Attending: Emergency Medicine | Admitting: Emergency Medicine

## 2022-04-29 DIAGNOSIS — N186 End stage renal disease: Secondary | ICD-10-CM | POA: Insufficient documentation

## 2022-04-29 DIAGNOSIS — Y732 Prosthetic and other implants, materials and accessory gastroenterology and urology devices associated with adverse incidents: Secondary | ICD-10-CM | POA: Diagnosis not present

## 2022-04-29 DIAGNOSIS — Z992 Dependence on renal dialysis: Secondary | ICD-10-CM | POA: Diagnosis not present

## 2022-04-29 DIAGNOSIS — T82590A Other mechanical complication of surgically created arteriovenous fistula, initial encounter: Secondary | ICD-10-CM | POA: Diagnosis present

## 2022-04-29 DIAGNOSIS — T829XXA Unspecified complication of cardiac and vascular prosthetic device, implant and graft, initial encounter: Secondary | ICD-10-CM

## 2022-04-29 NOTE — ED Triage Notes (Signed)
Pt came from dialysis today with difficultly with getting bleeding to stop from fistula. Pt arrived with clamp in place.

## 2022-04-29 NOTE — ED Notes (Signed)
Patient transported to Ultrasound 

## 2022-04-29 NOTE — ED Notes (Signed)
Pt returned from US

## 2022-04-29 NOTE — ED Provider Notes (Signed)
[facility] Provider Note   CSN: [id number] Arrival date & time: 04/29/22  1315     History {Add pertinent medical, surgical, social history, OB history to HPI:1} No chief complaint on file.   [name] is a 76 y.o. male.  [address] [place] Medications Prior to Admission medications   Medication Sig Start Date End Date Taking? Authorizing Provider  acetaminophen (TYLENOL) 325 MG tablet Take 2 tablets (650 mg total) by mouth every 6 (six) hours as needed for mild pain. 09/23/16   Nani Skillern, PA-C  allopurinol (ZYLOPRIM) 100 MG tablet Take 100 mg by mouth daily.  05/03/19   [provider]  atorvastatin (LIPITOR) 80 MG tablet Take 80 mg by mouth daily.    [provider]  carvedilol (COREG) 6.25 MG tablet Take 1 tablet (6.25 mg total) by mouth 2 (two) times daily. Patient taking differently: Take 6.25 mg by mouth 2 (two) times daily. Take 1/2 tablet BID 05/29/21 04/20/22  Arrien, Jimmy Picket, MD  clopidogrel (PLAVIX) 75 MG tablet Take 75 mg by mouth daily.    [provider]  finasteride (PROSCAR) 5 MG tablet Take 5 mg by mouth daily.    [provider]  folic acid (FOLVITE) 1 MG tablet Take 1 mg by mouth daily.    [provider]  furosemide (LASIX) 80 MG tablet Take 1 tablet (80 mg total) by mouth every Tuesday, Thursday, Saturday, and Sunday. 09/05/20   Orson Eva, MD  levothyroxine (SYNTHROID, LEVOTHROID) 50 MCG tablet Take 50 mcg by mouth daily before breakfast.    [provider]  lidocaine-prilocaine (EMLA) cream Apply 1 application. topically every Monday, Wednesday, and Friday with hemodialysis.    [provider]  nitroGLYCERIN (NITROSTAT) 0.4 MG SL tablet Place 0.4 mg under the tongue every 5 (five) minutes as needed for chest pain (max 3 doses).    [provider]  Omega-3 Fatty Acids (RA FISH OIL) 1000 MG CAPS Take 2,000 mg by mouth 2 (two) times daily.     [provider]  omeprazole (PRILOSEC) 40 MG capsule Take 40 mg by mouth daily. 02/14/20   [provider]      Allergies    Cardizem cd [diltiazem hcl er beads]    Review of Systems   Review of Systems  Physical Exam Updated Vital Signs BP (!) 168/73 (BP Location: Left Arm)   Pulse 67   Temp 97.8 F (36.6 C) (Oral)   Resp 16   Wt 83.2 kg   SpO2 98%   BMI 27.10 kg/m  Physical Exam  ED Results / Procedures / Treatments   Labs (all labs ordered are listed, but only abnormal results are displayed) Labs Reviewed - No data to display  EKG None  Radiology No results found.  Procedures Procedures  {Document cardiac monitor, telemetry assessment procedure when appropriate:1}  Medications Ordered in ED Medications - No data to display  ED Course/ Medical Decision Making/ A&P Clinical Course as of 04/29/22 1902  Fri Apr 29, 2022  1738 Dr Luan Pulling recommends following up this week. They will set up his appointment.  [RP]    Clinical Course User Index [RP] Fransico Meadow, MD                           Medical Decision Making  ***  {Document critical care time when appropriate:1} {Document review of labs  and clinical decision tools ie heart score, Chads2Vasc2 etc:1}  {Document your independent review of radiology images, and any outside records:1} {Document your discussion with family members, caretakers, and with consultants:1} {Document social determinants of health affecting pt's care:1} {Document your decision making why or why not admission, treatments were needed:1} Final Clinical Impression(s) / ED Diagnoses Final diagnoses:  None    Rx / DC Orders ED Discharge Orders     None

## 2022-04-29 NOTE — Discharge Instructions (Signed)
You were seen in the emergency department after a bleeding fistula.  You had an ultrasound and we are awaiting the results of this time.  Please check your MyChart for the results online.  Vascular surgery will be calling you about an appointment early next week.  If you do not hear from them call your vascular surgeon about an appointment.  Return if you strained any of the following: Chest pain, shortness of breath, arm pain, bleeding, or any other concerning symptoms.

## 2022-05-03 ENCOUNTER — Encounter: Payer: Self-pay | Admitting: Vascular Surgery

## 2022-05-03 ENCOUNTER — Ambulatory Visit (INDEPENDENT_AMBULATORY_CARE_PROVIDER_SITE_OTHER): Payer: No Typology Code available for payment source | Admitting: Vascular Surgery

## 2022-05-03 VITALS — BP 133/65 | HR 62 | Temp 98.9°F | Resp 20 | Ht 69.0 in | Wt 192.0 lb

## 2022-05-03 DIAGNOSIS — N186 End stage renal disease: Secondary | ICD-10-CM

## 2022-05-03 DIAGNOSIS — Z992 Dependence on renal dialysis: Secondary | ICD-10-CM

## 2022-05-03 NOTE — Progress Notes (Signed)
[facility] [place] PROGRESS NOTE  ASSESSMENT / PLAN: [name] is a 76 y.o. male with prolonged bleeding from right arm brachiocephalic arteriovenous fistula.  The fistula is working well, and worked well at dialysis yesterday.  No prolonged bleeding after this.  I suspect this was a technique issue.  He will plan to continue to follow-up with Dr. Donnetta Hutching for claudication as scheduled.   SUBJECTIVE: Went to [facility], ER for prolonged after dialysis this weekend, while I was on-call.  I discussed the case with the ER physician who was able to achieve hemostasis.  I counseled him to follow-up with me in clinic so I could evaluate the fistula.  Dialysis yesterday went very well.  He is an established patient of Dr. Luther Parody, and has recently been talking with Dr. Donnetta Hutching about claudication symptoms.  Overall, he is doing quite well.  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  No significant ulceration.     Latest Ref Rng & Units 04/21/2022    3:20 PM 08/12/2021    4:10 AM 08/11/2021   12:48 AM  CBC  WBC 3.4 - 10.8 x10E3/uL 5.2  6.1  5.7   Hemoglobin 13.0 - 17.7 g/dL 10.7  9.3  9.3   Hematocrit 37.5 - 51.0 % 30.8  29.6  30.8   Platelets 150 - 450 x10E3/uL 157  146  148         Latest Ref Rng & Units 08/12/2021    4:10 AM 08/11/2021   12:48 AM 08/10/2021    4:07 PM  CMP  Glucose 70 - 99 mg/dL 105  145  127   BUN 8 - 23 mg/dL 20  40  36   Creatinine 0.61 - 1.24 mg/dL 3.60  5.45  5.00   Sodium 135 - 145 mmol/L 137  136  138   Potassium  3.5 - 5.1 mmol/L 3.5  4.2  3.8   Chloride 98 - 111 mmol/L 99  99  101   CO2 22 - 32 mmol/L 29  23    Calcium 8.9 - 10.3 mg/dL 8.0  7.9      CrCl cannot be calculated (Patient's most recent lab result is older than the maximum 21 days allowed.).  Outside duplex ultrasound personally reviewed.  No technical issues with the fistula.  Yevonne Aline. Stanford Breed, MD [facility] Phone Number: [phone] 05/03/2022 9:54 AM

## 2022-05-05 ENCOUNTER — Telehealth: Payer: Self-pay

## 2022-05-05 DIAGNOSIS — A310 Pulmonary mycobacterial infection: Secondary | ICD-10-CM

## 2022-05-05 DIAGNOSIS — R918 Other nonspecific abnormal finding of lung field: Secondary | ICD-10-CM

## 2022-05-05 NOTE — Telephone Encounter (Signed)
Received CT CD and Radiology report from [place] from patient. Placed in Dr. Gustavus Bryant folder for review when he comes back to [facility] office.

## 2022-05-23 NOTE — Telephone Encounter (Signed)
Patient came into [facility] office this afternoon and asked if Dr. Melvyn Novas had seen CT and radiology report from [place] and wanted to know what he recommended. Dr. Melvyn Novas please advise. Paperwork and CT disc are no longer in your folder. Do you recall the information you received on the radiology report/disc and have any recommendations?

## 2022-05-23 NOTE — Telephone Encounter (Signed)
It showed minimal worsening typical of MAI which we don't treat at this level as the treatment is worse than the dz so rec regroup with plain cxr in 6 weeks Be sure patient  eeps f/u ov so we can go over all the details of this study and get a plan together moving forward - ok to move up f/u if not feeling better and wants to be seen sooner

## 2022-05-24 NOTE — Addendum Note (Signed)
Addended by: Fritzi Mandes D on: 05/24/2022 11:07 AM   Modules accepted: Orders

## 2022-05-24 NOTE — Telephone Encounter (Signed)
Called and gave results to patient and he voiced understanding. Scheduled  him for an ov and ordered CXR. Nothing further needed

## 2022-07-07 ENCOUNTER — Ambulatory Visit (HOSPITAL_COMMUNITY)
Admission: RE | Admit: 2022-07-07 | Discharge: 2022-07-07 | Disposition: A | Discharge status: Home / Self Care | Payer: No Typology Code available for payment source | Source: Ambulatory Visit | Attending: Internal Medicine | Admitting: Internal Medicine

## 2022-07-07 DIAGNOSIS — A310 Pulmonary mycobacterial infection: Secondary | ICD-10-CM | POA: Insufficient documentation

## 2022-07-12 ENCOUNTER — Encounter: Payer: Self-pay | Admitting: Internal Medicine

## 2022-07-12 ENCOUNTER — Ambulatory Visit (INDEPENDENT_AMBULATORY_CARE_PROVIDER_SITE_OTHER): Payer: No Typology Code available for payment source | Admitting: Internal Medicine

## 2022-07-12 VITALS — BP 140/78 | HR 58 | Ht 69.0 in | Wt 190.0 lb

## 2022-07-12 DIAGNOSIS — R918 Other nonspecific abnormal finding of lung field: Secondary | ICD-10-CM

## 2022-07-12 NOTE — Progress Notes (Signed)
[name], male    DOB: 09/18/1945    MRN: [id number]   Brief patient profile:  70   [facility] HD pt quit smoking in 2018  referred to pulmonary clinic in [place]  04/21/2022 by Winifred  for  abn ct with last CTchest 06/15/21 @ [facility] = clusters largest 5 mm    History of Present Illness  04/21/2022  Pulmonary/ 1st office eval/ Alisa Stjames / [place] [facility]  Chief Complaint  Patient presents with   Consult    CT done at [facility] hx of pul nodule COPD former smoker stopped in 2018    Dyspnea:  limited knees > sob  Cough: none  Sleep: cpap and flat bed 2 pillows  SABA use: none  02: none  Covid vax  max/ tested pos once  Has had localized bladder Ca followed at [place] and no recurrence as of 3 m prior to OV   Rec We can just follow nodules which are under 10 mm  (1.0 cm) in your case  Please remember to go to the lab department   for your tests - we will call you with the results when they are available. Please schedule a follow up visit in 3 months but call sooner if needed- bring your CT chest report      07/12/2022  f/u ov/[place] office/Raguel Kosloski re: MPNs   Chief Complaint  Patient presents with   Follow-up    Follow up after CXR   Dyspnea:  slow pace due to knees/ flat level  Cough: none but watery rhinitis x 2-3 months nothing purulent  Sleeping: level bed/ 1 pillow and new [facility] cpap and does fine  SABA use: none  02: none  Covid status: vax max / never infected  Lung cancer screening: per [facility]  Has bladder ca topical rx only    No obvious day to day or daytime variability or assoc excess/ purulent sputum or mucus plugs or hemoptysis or cp or chest tightness, subjective wheeze or overt sinus or hb symptoms.   Sleeping  without nocturnal  or early am exacerbation  of respiratory  c/o's or need for noct saba. Also denies any obvious fluctuation of symptoms with weather or environmental changes or other aggravating or alleviating factors except as outlined above   No unusual exposure hx or h/o  childhood pna/ asthma or knowledge of premature birth.  Current Allergies, Complete Past Medical History, Past Surgical History, Family History, and Social History were reviewed in [employer] record.  ROS  The following are not active complaints unless bolded Hoarseness, sore throat, dysphagia, dental problems, itching, sneezing,  nasal congestion or discharge of excess mucus or purulent secretions, ear ache,   fever, chills, sweats, unintended wt loss or wt gain, classically pleuritic or exertional cp,  orthopnea pnd or arm/hand swelling  or leg swelling, presyncope, palpitations, abdominal pain, anorexia, nausea, vomiting, diarrhea  or change in bowel habits or change in bladder habits, change in stools or change in urine, dysuria, hematuria,  rash, arthralgias, visual complaints, headache, numbness, weakness or ataxia or problems with walking or coordination,  change in mood or  memory.        Current Meds  Medication Sig   acetaminophen (TYLENOL) 325 MG tablet Take 2 tablets (650 mg total) by mouth every 6 (six) hours as needed for mild pain.   allopurinol (ZYLOPRIM) 100 MG tablet Take 100 mg by mouth daily.    atorvastatin (LIPITOR) 80 MG tablet Take 80 mg  by mouth daily.   clopidogrel (PLAVIX) 75 MG tablet Take 75 mg by mouth daily.   finasteride (PROSCAR) 5 MG tablet Take 5 mg by mouth daily.   folic acid (FOLVITE) 1 MG tablet Take 1 mg by mouth daily.   furosemide (LASIX) 80 MG tablet Take 1 tablet (80 mg total) by mouth every Tuesday, Thursday, Saturday, and Sunday.   levothyroxine (SYNTHROID, LEVOTHROID) 50 MCG tablet Take 50 mcg by mouth daily before breakfast.   lidocaine-prilocaine (EMLA) cream Apply 1 application. topically every Monday, Wednesday, and Friday with hemodialysis.   nitroGLYCERIN (NITROSTAT) 0.4 MG SL tablet Place 0.4 mg under the tongue every 5 (five) minutes as needed for chest pain (max 3 doses).   Omega-3 Fatty Acids (RA FISH OIL) 1000 MG  CAPS Take 2,000 mg by mouth 2 (two) times daily.   omeprazole (PRILOSEC) 40 MG capsule Take 40 mg by mouth daily.         Past Medical History:  Diagnosis Date   Acute myocardial infarction of other inferior wall, initial episode of care    Anemia    hx of bld transfusion   Anxiety    Arthritis    Cancer ([place])    bladder 2013 removed, has cystoscopy 10/2016, has returned x 2   Chronic kidney disease    dialysis - M-W-F at [facility] in [place]   COPD (chronic obstructive pulmonary disease) ([place])    patient is not aware of this dx   Coronary artery disease    Artery bypass graft Jan 2008   Dialysis patient [facility])    GERD (gastroesophageal reflux disease)    Hearing loss    wears hearing aids   History of blood transfusion 2017   History of hiatal hernia    Hyperlipidemia    Hypertension    Hypothyroidism    PONV (postoperative nausea and vomiting)    only with TURP surgery, no other problems with the other surgeries   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 MAngelena Formand Dr. BCyndia Bent  Severe aortic stenosis    Sleep apnea    uses CPAP nightly   [place]    Tobacco user    quit 09/2016      Objective:    Wt Readings from Last 3 Encounters:  07/12/22 190 lb (86.2 kg)  05/03/22 192 lb (87.1 kg)  04/29/22 183 lb 8 oz (83.2 kg)      Vital signs reviewed  07/12/2022  - Note at rest 02 sats  96% on RA   General appearance:    amb pleasant wm nad   HEENT : Oropharynx  clear       NECK :  without  apparent JVD/ palpable Nodes/TM    LUNGS: no acc muscle use,  Nl contour chest which is clear to A and P bilaterally without cough on insp or exp maneuvers   CV:  RRR  no s3 or murmur or increase in P2, and no edema   ABD:  soft and nontender with nl inspiratory excursion in the supine position. No bruits or organomegaly appreciated   MS:  Nl gait/ ext warm without deformities Or obvious joint restrictions  calf  tenderness, cyanosis or clubbing    SKIN: warm and dry without lesions    NEURO:  alert, approp, nl sensorium with  no motor or cerebellar deficits apparent.          I personally reviewed images  and agree with radiology impression as follows:  CXR:   pa and lateral  07/07/22 Chronic nodular foci lower lateral LEFT chest, unchanged, question scarring.  Enlargement of cardiac silhouette post median sternotomy and TAVR.  No definite acute abnormalities.

## 2022-07-12 NOTE — Assessment & Plan Note (Signed)
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   The [facility] is doing CT surveillance for lung ca and metastatic bladder ca but so far no lesions at a cutoff of 1 cm (above which we see less false negatives) or greater where PET may useful (less false neg but where  we see increased  false Positives in the setting of MAI) .  I explained all this to pt and wife and happy to see back here at anytime for increasing symptoms or nodules > 1 cm  Discussed in detail all the  indications, usual  risks and alternatives  relative to the benefits with patient who agrees to proceed with w/u and f/u as outlined.            Each maintenance medication was reviewed in detail including emphasizing most importantly the difference between maintenance and prns and under what circumstances the prns are to be triggered using an action plan format where appropriate.  Total time for H and P, chart review, counseling,   and generating customized AVS unique to this office visit / same day charting = 32 min

## 2022-07-12 NOTE — Patient Instructions (Signed)
Next step if any new nodules larger than 1 cm =  PET scan (if the [facility] doesn't do this we can do it at [facility])   Pulmonary follow is up is as needed

## 2022-08-03 ENCOUNTER — Ambulatory Visit (HOSPITAL_BASED_OUTPATIENT_CLINIC_OR_DEPARTMENT_OTHER): Payer: Medicare Other

## 2022-08-03 ENCOUNTER — Ambulatory Visit: Payer: Medicare Other | Attending: Cardiology | Admitting: Cardiology

## 2022-08-03 VITALS — BP 160/62 | HR 48 | Ht 68.5 in | Wt 185.0 lb

## 2022-08-03 DIAGNOSIS — Z952 Presence of prosthetic heart valve: Secondary | ICD-10-CM

## 2022-08-03 DIAGNOSIS — Z992 Dependence on renal dialysis: Secondary | ICD-10-CM

## 2022-08-03 DIAGNOSIS — N186 End stage renal disease: Secondary | ICD-10-CM | POA: Diagnosis not present

## 2022-08-03 DIAGNOSIS — R918 Other nonspecific abnormal finding of lung field: Secondary | ICD-10-CM | POA: Diagnosis not present

## 2022-08-03 DIAGNOSIS — R001 Bradycardia, unspecified: Secondary | ICD-10-CM

## 2022-08-03 DIAGNOSIS — I10 Essential (primary) hypertension: Secondary | ICD-10-CM | POA: Diagnosis not present

## 2022-08-03 DIAGNOSIS — I350 Nonrheumatic aortic (valve) stenosis: Secondary | ICD-10-CM | POA: Insufficient documentation

## 2022-08-03 DIAGNOSIS — Z951 Presence of aortocoronary bypass graft: Secondary | ICD-10-CM | POA: Diagnosis not present

## 2022-08-03 DIAGNOSIS — E279 Disorder of adrenal gland, unspecified: Secondary | ICD-10-CM | POA: Diagnosis not present

## 2022-08-03 LAB — ECHOCARDIOGRAM COMPLETE
AR max vel: 1.78 cm2
AV Area VTI: 1.86 cm2
AV Area mean vel: 1.64 cm2
AV Mean grad: 14 mmHg
AV Peak grad: 20.3 mmHg
Ao pk vel: 2.25 m/s
Area-P 1/2: 4.08 cm2
MV M vel: 6.11 m/s
MV Peak grad: 149.3 mmHg
Radius: 0.9 cm
S' Lateral: 4.4 cm

## 2022-08-03 MED ORDER — AMLODIPINE BESYLATE 5 MG PO TABS: 5.0000 mg | ORAL_TABLET | Freq: Every day | ORAL | 3 refills | Status: AC

## 2022-08-03 NOTE — Progress Notes (Unsigned)
HEART AND [place]                                     Cardiology Office Note:    Date:  08/04/2022   ID:  MAXSON SOQUI, DOB 1946-05-10, MRN [id number]  PCP:  [facility]  [facility] Cardiologist:  Carlyle Dolly, MD  / Dr. Angelena Form, MD & Dr. Cyndia Bent MD (TAVR)   [facility] Electrophysiologist:  None   Referring MD: [facility]   Chief Complaint  Patient presents with   Follow-up    1 year s/p TAVR   History of Present Illness:    [name] is a 77 y.o. 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   Mr. Haberberger has been followed by Dr. Harl Bowie for his cardiology care. He was admitted to [facility] in 05/2021 with a NSTEMI. He had an echocardiogram 05/24/21 which showed an LVEF at 45%, mild to moderate MR. Severe aortic low flow/low gradient aortic stenosis with mean gradient 24 mmHg, peak gradient 41 mmHg, AVA 0.9 cm2, DI 0.24, SVI 38. He had CABG in 2008 and redo CABG in 2018. Cardiac cath 05/28/21 showed severe three vessel CAD, patent free RIMA graft to the diagonal, patent right radial artery graft to the posterolateral artery, patent LIMA to LAD, moderate ostial LCX with no culprit lesion noted. Subsequent pre TAVR CT scans showed no good options for access from the femoral arteries, carotid arteries or subclavian arteries. 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    He underwent successful TAVR with a 29 mm Edwards Sapien 3UR THV via the TA approach on 08/10/21. Chest tube removed the following day. Post operative echo showed LVEF at 35-45% with severe hypokinesis of the apex (known), mean gradient 5.36mmHg, peak 9.26mmHg, AVA by VTI at  2.46cm2, and no PVL. A follow up groin US showed his right PSA was thrombosed. He was discharged on his chronic DAPT.    In follow up, he was doing very well. He was able to get out and mow his yard without issues. He continues to do well today with no CP or SOB. No LE edema, orthopnea or PND. No dizziness or syncope. No blood in stool or urine. No palpitations. He is tolerating medications well. Does note that his BP has been rising over the last several months. He was previously taking midodrine for post HD hypotension but he self stopped this due to BP being in the 130-140 range. His beta blocker was increased recently with some improvement. His HR is low at baseline in the 50-60 range.  Past Medical History:  Diagnosis Date   Acute myocardial infarction of other inferior wall, initial episode of care    Anemia    hx of bld transfusion   Anxiety    Arthritis    Cancer ([place])    bladder 2013 removed, has cystoscopy 10/2016, has returned x 2   Chronic kidney disease    dialysis - M-W-F at [facility] in 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 Angelena Form and Dr. Cyndia Bent   Severe aortic stenosis    Sleep apnea    uses CPAP nightly   Stroke [facility])    Tobacco user    quit 09/2016    Past Surgical History:  Procedure Laterality Date   APPENDECTOMY     AV FISTULA PLACEMENT Right 02/11/2020   Procedure: RIGHT ARM BRACHIOCEPHALIC ARTERIOVENOUS (AV) FISTULA;   Surgeon: Angelia Mould, MD;  Location: [place];  Service: Vascular;  Laterality: Right;   CARDIAC CATHETERIZATION  05/28/2021   COLONOSCOPY WITH PROPOFOL N/A 09/02/2020   Procedure: COLONOSCOPY WITH PROPOFOL;  Surgeon: Rogene Houston, MD;  [facility];  Service: Endoscopy;  Laterality: N/A;   CORONARY ARTERY BYPASS GRAFT  06/14/2006   5 blockages   CORONARY ARTERY BYPASS GRAFT N/A 09/19/2016   Procedure: REDO CORONARY ARTERY BYPASS GRAFTING (CABG) x two, using right internal mammary artery and left radial artery;  Surgeon: [name], MD;  Location: [place];  Service: Open Heart Surgery;  Laterality: N/A;   FOOT SURGERY Right    HEMOSTASIS CLIP PLACEMENT  09/02/2020   Procedure: HEMOSTASIS CLIP PLACEMENT;  Surgeon: Rogene Houston, MD;  [facility];  Service: Endoscopy;;   HERNIA REPAIR     x 3 surgeries   HOT HEMOSTASIS  09/02/2020   Procedure: HOT HEMOSTASIS (ARGON PLASMA COAGULATION/BICAP);  Surgeon: Rogene Houston, MD;  [facility];  Service: Endoscopy;;   LEFT HEART CATH AND CORS/GRAFTS ANGIOGRAPHY N/A 09/14/2016   Procedure: Left Heart Cath and Cors/Grafts Angiography;  Surgeon: Troy Sine, MD;  Location: [place] CV LAB;  Service: Cardiovascular;  Laterality: N/A;   LEFT HEART CATHETERIZATION WITH CORONARY/GRAFT ANGIOGRAM N/A 01/31/2014   Procedure: LEFT HEART CATHETERIZATION WITH Beatrix Fetters;  Surgeon: Sanda Klein, MD;  Location: [place] CATH LAB;  Service: Cardiovascular;  Laterality: N/A;   PERCUTANEOUS CORONARY STENT INTERVENTION (PCI-S)  01/31/2014   Procedure: PERCUTANEOUS CORONARY STENT INTERVENTION (PCI-S);  Surgeon: Sanda Klein, MD;  Location: [facility] CATH LAB;  Service: Cardiovascular;;   POLYPECTOMY  09/02/2020   Procedure: POLYPECTOMY;  Surgeon: Rogene Houston, MD;  [facility];  Service: Endoscopy;;   RADIAL ARTERY HARVEST Left 09/19/2016   Procedure: RADIAL ARTERY HARVEST;  Surgeon: [name], MD;  Location: [place];  Service: Open Heart Surgery;  Laterality: Left;   RIGHT/LEFT HEART CATH AND CORONARY/GRAFT ANGIOGRAPHY N/A 05/28/2021   Procedure: RIGHT/LEFT HEART CATH AND CORONARY/GRAFT ANGIOGRAPHY;  Surgeon: Burnell Blanks, MD;  Location: [place] CV LAB;  Service: Cardiovascular;  Laterality: N/A;   TEE WITHOUT CARDIOVERSION N/A 09/19/2016   Procedure: TRANSESOPHAGEAL ECHOCARDIOGRAM (TEE);  Surgeon: [name], MD;  Location: [place];  Service: Open Heart Surgery;  Laterality: N/A;   TEE WITHOUT CARDIOVERSION N/A 08/10/2021   Procedure: TRANSESOPHAGEAL ECHOCARDIOGRAM (TEE);  Surgeon: Burnell Blanks, MD;  Location: [facility]  OR;  Service: Open Heart Surgery;  Laterality: N/A;   TRANSCATHETER AORTIC VALVE REPLACEMENT, TRANSAPICAL N/A 08/10/2021   Procedure: Transcatheter Aortic Valve Replacement-Transapical;  Surgeon: Burnell Blanks, MD;  Location: [place];  Service: Open Heart Surgery;  Laterality: N/A;   TRANSURETHRAL RESECTION BLADDER TUMOR and bilateral RPG's  10/07/2020   [facility]    Current Medications: Current Meds  Medication Sig   acetaminophen (TYLENOL) 325 MG tablet Take 2 tablets (650 mg total) by mouth every 6 (six) hours as needed for mild pain.   allopurinol (ZYLOPRIM) 100 MG tablet Take 100 mg by mouth daily.    amLODipine (NORVASC) 5 MG tablet Take 1 tablet (5 mg total) by mouth daily.   atorvastatin (LIPITOR) 80 MG tablet Take 80 mg by mouth daily.   carvedilol (COREG) 25 MG tablet Take 25 mg by mouth 2 (two) times daily with a meal.   clopidogrel (PLAVIX) 75 MG tablet Take 75 mg by mouth daily.   finasteride (PROSCAR) 5 MG tablet Take 5 mg by mouth daily.   folic acid (FOLVITE) 1 MG tablet Take 1 mg by mouth daily.   furosemide (LASIX) 80 MG tablet Take 1 tablet (80 mg total) by mouth every Tuesday, Thursday, Saturday, and Sunday.   levothyroxine (SYNTHROID, LEVOTHROID) 50 MCG tablet Take 50 mcg by mouth daily before breakfast.    lidocaine-prilocaine (EMLA) cream Apply 1 application. topically every Monday, Wednesday, and Friday with hemodialysis.   nitroGLYCERIN (NITROSTAT) 0.4 MG SL tablet Place 0.4 mg under the tongue every 5 (five) minutes as needed for chest pain (max 3 doses).   Omega-3 Fatty Acids (RA FISH OIL) 1000 MG CAPS Take 2,000 mg by mouth 2 (two) times daily.   omeprazole (PRILOSEC) 40 MG capsule Take 40 mg by mouth daily.     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[employer], educational  Tobacco Use   Smoking status: Former    Packs/day: 1.00    Years: 40.00    Additional pack years: 0.00    Total pack years: 40.00    Types: Cigarettes    Start date: 02/20/1959    Quit date: 09/13/2016    Years since quitting: 5.8   Smokeless tobacco: Never  Vaping Use   Vaping Use: Never used  Substance and Sexual Activity   Alcohol use: No    Alcohol/week: 0.0 standard drinks of alcohol    Comment: "No, not really"   Drug use: No   Sexual activity: Not Currently  Other Topics Concern   Not on file  Social History Narrative   Not on file   Social Determinants of Health   Financial Resource Strain: Not on file  Food Insecurity: Not on file  Transportation Needs: Not on file  Physical Activity: Not on file  Stress: Not on file  Social Connections: Not on file     Family History: The patient's family history includes CVA in his father and mother; Heart attack in his brother and mother. There is no history of Colon cancer or Colon polyps.  ROS:   Please see the history of present illness.    All other systems reviewed and are negative.  EKGs/Labs/Other Studies Reviewed:  The following studies were reviewed today:  Echocardiogram 08/03/22:    1. Left ventricular ejection fraction, by estimation, is 45 to 50%. The  left ventricle has mildly decreased function. The left ventricle  demonstrates regional wall motion abnormalities (see scoring  diagram/findings for description). There is mild  concentric left ventricular hypertrophy. Left ventricular diastolic  parameters are consistent with Grade II diastolic dysfunction  (pseudonormalization). Elevated left atrial pressure. There is moderate  hypokinesis of the left ventricular, basal-mid  inferior wall and inferolateral wall.   2. Right ventricular systolic function is mildly reduced. The right  ventricular size is mildly enlarged. There is mildly elevated pulmonary  artery systolic pressure. The estimated right ventricular systolic  pressure is [id number] mmHg.   3. Left atrial size was severely dilated.   4. The mitral valve is normal in structure. Moderate mitral valve  regurgitation.   5. The aortic valve has been repaired/replaced. Aortic valve  regurgitation is not visualized. No aortic stenosis is present. There is a  29 mm Sapien prosthetic (TAVR) valve present in the aortic position.  Procedure Date: 08/10/2021. Echo findings are  consistent with normal structure and function of the aortic valve  prosthesis. Aortic valve mean gradient measures 14.0 mmHg. Aortic valve  Vmax measures 2.25 m/s. Aortic valve acceleration time measures 85 msec.   6. The inferior vena cava is normal in size with greater than 50%  respiratory variability, suggesting right atrial pressure of 3 mmHg.   Comparison(s): Prior images reviewed side by side. The left ventricular  function has improved. The inferolateral wall motion abnormality was also  present on the previous studies, although reported as "global  hypokinesis". The apical wall motion  abnormality that was present after transapical TAVR has improved and there  is only a very small area of residual apical hypokinesis. Mitral   insufficiency severity is unchanged, as are the TAVR parameters.    CARDIOTHORACIC SURGERY OPERATIVE NOTE   Date of Procedure:               07/29/2014   Preoperative Diagnosis:      Severe Aortic Stenosis    Postoperative Diagnosis:    Same     Procedure:        Transcatheter Aortic Valve Replacement - Transapical using an Edwards Sapien 3 THV (size 29 mm, model # 9600TFX, serial # [id number])              Co-Surgeons:                        [name], MD and Lauree Chandler, MD     Anesthesiologist:                 Suella Broad, MD   Echocardiographer:             Marry Guan, MD   Pre-operative Echo Findings: Severe aortic stenosis Moderate left ventricular systolic dysfunction   Post-operative Echo Findings: No paravalvular leak Moderate left ventricular systolic dysfunction   _____________     Echocardiogram 08/11/21:   1. S/p TAVR with trans-apical access. Global hypokinesis with severe  hypokinesis of the apex which is expected. Left ventricular ejection  fraction, by estimation, is 35 to 40%. The left ventricle has moderately  decreased function. The left ventricle  demonstrates regional wall motion abnormalities (see scoring  diagram/findings for description). There is moderate left ventricular  hypertrophy. Indeterminate diastolic filling due to E-A fusion.   2. Right  ventricular systolic function is moderately reduced. The right  ventricular size is normal. There is moderately elevated pulmonary artery  systolic pressure. The estimated right ventricular systolic pressure is  0000000 mmHg.   3. The mitral valve is degenerative. Mild mitral valve regurgitation. No  evidence of mitral stenosis.   4. 29 mm S3. Vmax 1.5 m/s, MG 5.7 mmHG, EOA 2.46 cm2, DI 0.50. No  regurgitation or PVL. The aortic valve has been repaired/replaced. Aortic  valve regurgitation is not visualized. There is a 29 mm Sapien prosthetic  (TAVR) valve present in the aortic   position. Procedure Date: 08/10/2021. Echo findings are consistent with  normal structure and function of the aortic valve prosthesis.   5. The inferior vena cava is normal in size with greater than 50%  respiratory variability, suggesting right atrial pressure of 3 mmHg.    _______________________   Echo 09/17/21 IMPRESSIONS  1. Left ventricular ejection fraction, by estimation, is 35 to 40%. The left ventricle has moderately decreased function. Global hypokinesis with apical akinesis post transapical procedure. There is moderate concentric left ventricular hypertrophy. Left  ventricular diastolic parameters are consistent with Grade II diastolic dysfunction (pseudonormalization).  2. Right ventricular systolic function is normal. The right ventricular size is mildly enlarged. There is moderately elevated pulmonary artery systolic pressure. The estimated right ventricular systolic pressure is [id number] mmHg.  3. Left atrial size was mildly dilated.  4. The mitral valve is degenerative. Moderate to severe mitral valve regurgitation. No evidence of mitral stenosis. Pulmonary vein blunting without systolic flow reversal.  5. The aortic valve has been replaced with a 29 mm Sapien Valve. No central AI or paravalular leak. There is a 29 mm Sapien prosthetic (TAVR) valve present in the aortic position. Procedure Date: 08/10/21. Effective orifice area, by VTI measures 2.22  cm. Aortic valve mean gradient measures 8.0 mmHg. Peak gradient of 14 mm Hg. FVI 0.49.  6. Echo findings are consistent with normal structure and function of the aortic valve prosthesis.  7. The inferior vena cava is normal in size with <50% respiratory variability, suggesting right atrial pressure of 8 mmHg.   Comparison(s): Prior images reviewed side by side. 08/11/21 EF 35-40%. AV 26mmHg mean PG, 51mmHg peak PG. Mitral regurgitation is worse form prior.   EKG:  EKG is not ordered today.    Recent Labs: 08/06/2021: ALT 15 08/11/2021:  Magnesium 1.5 08/12/2021: BUN 20; Creatinine, Ser 3.60; Potassium 3.5; Sodium 137 04/21/2022: Hemoglobin 10.7; Platelets 157  Recent Lipid Panel    Component Value Date/Time   CHOL 136 02/07/2020 2130   TRIG 136 02/07/2020 2130   HDL 21 (L) 02/07/2020 2130   CHOLHDL 6.5 02/07/2020 2130   VLDL 27 02/07/2020 2130   LDLCALC 88 02/07/2020 2130   LDLDIRECT 87.8 02/10/2010 0839   Physical Exam:    VS:  BP (!) 160/62   Pulse (!) 48   Ht 5' 8.5" (1.74 m)   Wt 185 lb (83.9 kg)   SpO2 95%   BMI 27.72 kg/m     Wt Readings from Last 3 Encounters:  08/03/22 185 lb (83.9 kg)  07/12/22 190 lb (86.2 kg)  05/03/22 192 lb (87.1 kg)    ASSESSMENT/PLAN:    Severe AS s/p TAVR: Patient doing well with NYHA class I symptoms s/p TAVR. Echo today with slightly improved EF to 45-50% with stable valve function with a mean gradient at 75mmHg, peak 20.80mmHg, and AVA by VTI at 1.86cm2. LV function improved with TAVR. He will  continue on Plavix given CAD. SBE prophylaxis discussed; the patient is edentulous and does not go to the dentist. Plan for continued follow up with cardiologist at the [facility].    ESRD on HD M-W-F: HD today. No longer requiring midodrine prior to HD.    HTN: BP elevated today. Reports its been running on the higher side at home. Beta blocker recently increased. Will add amlodipine 5mg  QD to regimen today.[facility]    CAD s/p CABGx4 (2008 by [facility]) and redo CABG x2 (2018 [facility]) with recent NSTEMI: Cath 05/28/21 showed no obvious culprit lesions for NSTEMI. Continued medical management of CAD was recommended.    Adrenal lesion: pre TAVR CT showed an unchanged size of indeterminate left adrenal lesion measuring 1.3 cm. Further evaluation with dedicated abdominal MRI should be considered. This was printed out for him and he will follow up with PCP.    Pulmonary nodules: pre TAVR CT showed small clustered nodules of the left lingula, likely infectious or inflammatory. Largest  measures 5 mm. No follow-up needed if patient is low-risk (and has no known or suspected primary neoplasm). Non-contrast chest CT can be considered in 12 months if patient is high-risk. He has known pulmonary nodules which are followed with serial CTs by [facility] and more recently he has been following with Dr. Melvyn Novas.    Medication Adjustments/Labs and Tests Ordered: Current medicines are reviewed at length with the patient today.  Concerns regarding medicines are outlined above.  No orders of the defined types were placed in this encounter.  Meds ordered this encounter  Medications   amLODipine (NORVASC) 5 MG tablet    Sig: Take 1 tablet (5 mg total) by mouth daily.    Dispense:  90 tablet    Refill:  3    Patient Instructions  Medication Instructions:  Your physician has recommended you make the following change in your medication:  START AMLODIPINE 5 MG DAILY.  *If you need a refill on your cardiac medications before your next appointment, please call your pharmacy*   Lab Work: NONE If you have labs (blood work) drawn today and your tests are completely normal, you will receive your results only by: [place] (if you have MyChart) OR A paper copy in the mail If you have any lab test that is abnormal or we need to change your treatment, we will call you to review the results.   Testing/Procedures: NONE   Follow-Up: At [facility], you and your health needs are our priority.  As part of our continuing mission to provide you with exceptional heart care, we have created designated Provider Care Teams.  These Care Teams include your primary Cardiologist (physician) and Advanced Practice Providers (APPs -  Physician Assistants and Nurse Practitioners) who all work together to provide you with the care you need, when you need it.  We recommend signing up for the patient portal called "MyChart".  Sign up information is provided on this After Visit Summary.  MyChart is used  to connect with patients for Virtual Visits (Telemedicine).  Patients are able to view lab/test results, encounter notes, upcoming appointments, etc.  Non-urgent messages can be sent to your provider as well.   To learn more about what you can do with MyChart, go to [email or web address].    Your next appointment:   [facility]OLOG[facility]   Signed, Kathyrn Drown, NP  08/04/2022 2:19 PM    [facility]

## 2022-08-03 NOTE — Patient Instructions (Signed)
Medication Instructions:  Your physician has recommended you make the following change in your medication:  START AMLODIPINE 5 MG DAILY.  *If you need a refill on your cardiac medications before your next appointment, please call your pharmacy*   Lab Work: NONE If you have labs (blood work) drawn today and your tests are completely normal, you will receive your results only by: [place] (if you have MyChart) OR A paper copy in the mail If you have any lab test that is abnormal or we need to change your treatment, we will call you to review the results.   Testing/Procedures: NONE   Follow-Up: At [facility], you and your health needs are our priority.  As part of our continuing mission to provide you with exceptional heart care, we have created designated Provider Care Teams.  These Care Teams include your primary Cardiologist (physician) and Advanced Practice Providers (APPs -  Physician Assistants and Nurse Practitioners) who all work together to provide you with the care you need, when you need it.  We recommend signing up for the patient portal called "MyChart".  Sign up information is provided on this After Visit Summary.  MyChart is used to connect with patients for Virtual Visits (Telemedicine).  Patients are able to view lab/test results, encounter notes, upcoming appointments, etc.  Non-urgent messages can be sent to your provider as well.   To learn more about what you can do with MyChart, go to [email or web address].    Your next appointment:   WITH YOUR CARDIOLOGIST AT [place]

## 2022-08-08 ENCOUNTER — Ambulatory Visit
Admission: EM | Admit: 2022-08-08 | Discharge: 2022-08-08 | Disposition: A | Discharge status: Home / Self Care | Payer: No Typology Code available for payment source

## 2022-08-08 DIAGNOSIS — J309 Allergic rhinitis, unspecified: Secondary | ICD-10-CM

## 2022-08-08 LAB — POCT INFLUENZA A/B
Influenza A, POC: NEGATIVE
Influenza B, POC: NEGATIVE

## 2022-08-08 MED ORDER — CETIRIZINE HCL 10 MG PO TABS: 10.0000 mg | ORAL_TABLET | Freq: Every day | ORAL | 0 refills | Status: AC

## 2022-08-08 MED ORDER — BENZONATATE 100 MG PO CAPS: 100.0000 mg | ORAL_CAPSULE | Freq: Three times a day (TID) | ORAL | 0 refills | Status: AC | PRN

## 2022-08-08 MED ORDER — FLUTICASONE PROPIONATE 50 MCG/ACT NA SUSP: 2.0000 | Freq: Every day | NASAL | 0 refills | Status: AC

## 2022-08-08 NOTE — ED Triage Notes (Addendum)
Cough, congestion runny nose, watery eyes, sore throat, itchy throat and ears, headache that started Saturday. Taking OTC tylenol cold and flu and OTC allergy medication but no relief of symptoms. Pt states he took a home Covid test yesterday and was negative.

## 2022-08-08 NOTE — ED Provider Notes (Signed)
[facility]    CSN: [id number] Arrival date & time: 08/08/22  [id number]      History   Chief Complaint Chief Complaint  Patient presents with   Nasal Congestion   Cough    HPI [name] is a 77 y.o. male.   The history is provided by the patient.   The patient presents for complaints of chills, headache, sneezing, nasal congestion, runny nose, scratchy throat, watery eyes, and cough that been present for the past 2 days.  He denies fever, ear pain, wheezing, shortness of breath, difficulty breathing, chest pain, abdominal pain, nausea, vomiting, or diarrhea.  Patient states that he has been taking over-the-counter Tylenol cold and flu medicine with minimal relief.  He also states that he took an allergy pill his wife gave him last evening.  Patient denies history of seasonal allergies or asthma.  States that he took a home COVID test which was negative. Past Medical History:  Diagnosis Date   Acute myocardial infarction of other inferior wall, initial episode of care    Anemia    hx of bld transfusion   Anxiety    Arthritis    Cancer ([place])    bladder 2013 removed, has cystoscopy 10/2016, has returned x 2   Chronic kidney disease    dialysis - M-W-F at [facility] in [place]   COPD (chronic obstructive pulmonary disease) ([place])    patient is not aware of this dx   Coronary artery disease    Artery bypass graft Jan 2008   Dialysis patient [facility])    GERD (gastroesophageal reflux disease)    Hearing loss    wears hearing aids   History of blood transfusion 2017   History of hiatal hernia    Hyperlipidemia    Hypertension    Hypothyroidism    PONV (postoperative nausea and vomiting)    only with TURP surgery, no other problems with the other surgeries   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 Angelena Form and Dr. 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   Aortic stenosis 05/26/2021   Abdominal pain 04/05/2021   Cholelithiasis 04/05/2021   Hypertensive urgency 04/05/2021   Adenoma of right adrenal gland 04/05/2021   Elevated lipase 04/05/2021   Hypothyroidism (acquired) 04/05/2021   GERD (gastroesophageal reflux disease) 04/05/2021   History of COPD 04/05/2021   Malignant neoplasm of overlapping sites of bladder ([place]) 09/07/2020   End-stage renal disease on hemodialysis ([place]) 09/02/2020   Rectal bleeding    Acute diastolic CHF (congestive heart failure) ([place]) 05/09/2020   Acute exacerbation of Diastolic CHF (congestive heart failure) ([place]) 05/08/2020   Symptomatic bradycardia with heart failure 05/08/2020   COPD without exacerbation (HCC)---40 Pack Years 05/08/2020   Acute respiratory failure with hypoxia ([place]) 05/08/2020   Uremia in the setting of AKI on CKD V 02/07/2020   CKD (chronic kidney disease), stage V ([place]) 02/07/2020   Coronary artery disease involving native coronary artery of native heart without angina pectoris 12/16/2019   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   Gastroenteritis 12/16/2019   History of bladder cancer 12/16/2019   Hyperkalemia 12/16/2019  Renal lesion 05/16/2019   Proteinuria 03/28/2019   Acute blood loss anemia 06/08/2018   Anemia in chronic kidney disease 06/08/2018   BPH (benign prostatic hyperplasia) 06/08/2018   Coronary artery disease with unstable angina pectoris ([place]) 06/08/2018   Elevated troponin 06/08/2018   GIB (gastrointestinal bleeding) 06/07/2018   (HFpEF) heart failure with preserved ejection fraction ([place]) 06/30/2017   Coronary artery disease involving autologous artery coronary bypass graft 06/29/2017   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/19/2016   NSTEMI (non-ST elevated myocardial infarction) ([place]) 09/14/2016    Chest pain 09/14/2016   Obstructive sleep apnea 09/14/2016   Acute myocardial infarction of other inferior wall, initial episode of care    Unstable angina ([place]) 01/30/2014   OVERWEIGHT/OBESITY 02/02/2010   HYPERLIPIDEMIA, MIXED 01/22/2009   OTHER CHEST PAIN 01/22/2009   TOBACCO USER 01/17/2009   Essential hypertension 01/17/2009    Past Surgical History:  Procedure Laterality Date   APPENDECTOMY     AV FISTULA PLACEMENT Right 02/11/2020   Procedure: RIGHT ARM BRACHIOCEPHALIC ARTERIOVENOUS (AV) FISTULA;  Surgeon: Angelia Mould, MD;  Location[facility] OR;  Service: Vascular;  Laterality: Right;   CARDIAC CATHETERIZATION  05/28/2021   COLONOSCOPY WITH PROPOFOL N/A 09/02/2020   Procedure: COLONOSCOPY WITH PROPOFOL;  Surgeon: Rogene Houston, MD;  [facility];  Service: Endoscopy;  Laterality: N/A;   CORONARY ARTERY BYPASS GRAFT  06/14/2006   5 blockages   CORONARY ARTERY BYPASS GRAFT N/A 09/19/2016   Procedure: REDO CORONARY ARTERY BYPASS GRAFTING (CABG) x two, using right internal mammary artery and left radial artery;  Surgeon: [name], MD;  Location: [place];  Service: Open Heart Surgery;  Laterality: N/A;   FOOT SURGERY Right    HEMOSTASIS CLIP PLACEMENT  09/02/2020   Procedure: HEMOSTASIS CLIP PLACEMENT;  Surgeon: Rogene Houston, MD;  [facility];  Service: Endoscopy;;   HERNIA REPAIR     x 3 surgeries   HOT HEMOSTASIS  09/02/2020   Procedure: HOT HEMOSTASIS (ARGON PLASMA COAGULATION/BICAP);  Surgeon: Rogene Houston, MD;  [facility];  Service: Endoscopy;;   LEFT HEART CATH AND CORS/GRAFTS ANGIOGRAPHY N/A 09/14/2016   Procedure: Left Heart Cath and Cors/Grafts Angiography;  Surgeon: Troy Sine, MD;  Location: [place] [facility];  Service: Cardiovascular;  Laterality: N/A;   LEFT HEART CATHETERIZATION WITH CORONARY/GRAFT ANGIOGRAM N/A 01/31/2014   Procedure: LEFT HEART CATHETERIZATION WITH Beatrix Fetters;  Surgeon:  Sanda Klein, MD;  Location: [place] CATH LAB;  Service: Cardiovascular;  Laterality: N/A;   PERCUTANEOUS CORONARY STENT INTERVENTION (PCI-S)  01/31/2014   Procedure: PERCUTANEOUS CORONARY STENT INTERVENTION (PCI-S);  Surgeon: Sanda Klein, MD;  Location: [facility] CATH LAB;  Service: Cardiovascular;;   POLYPECTOMY  09/02/2020   Procedure: POLYPECTOMY;  Surgeon: Rogene Houston, MD;  [facility];  Service: Endoscopy;;   RADIAL ARTERY HARVEST Left 09/19/2016   Procedure: RADIAL ARTERY HARVEST;  Surgeon: [name], MD;  Location: [place];  Service: Open Heart Surgery;  Laterality: Left;   RIGHT/LEFT HEART CATH AND CORONARY/GRAFT ANGIOGRAPHY N/A 05/28/2021   Procedure: RIGHT/LEFT HEART CATH AND CORONARY/GRAFT ANGIOGRAPHY;  Surgeon: Burnell Blanks, MD;  Location: [place] [facility];  Service: Cardiovascular;  Laterality: N/A;   TEE WITHOUT CARDIOVERSION N/A 09/19/2016   Procedure: TRANSESOPHAGEAL ECHOCARDIOGRAM (TEE);  Surgeon: [name], MD;  Location: [place];  Service: Open Heart Surgery;  Laterality: N/A;   TEE WITHOUT CARDIOVERSION N/A 08/10/2021   Procedure: TRANSESOPHAGEAL ECHOCARDIOGRAM (TEE);  Surgeon:  Burnell Blanks, MD;  Location: [place];  Service: Open Heart Surgery;  Laterality: N/A;   TRANSCATHETER AORTIC VALVE REPLACEMENT, TRANSAPICAL N/A 08/10/2021   Procedure: Transcatheter Aortic Valve Replacement-Transapical;  Surgeon: Burnell Blanks, MD;  Location: [place];  Service: Open Heart Surgery;  Laterality: N/A;   TRANSURETHRAL RESECTION BLADDER TUMOR and bilateral RPG's  10/07/2020   [facility] Medications    Prior to Admission medications   Medication Sig Start Date End Date Taking? Authorizing Provider  acetaminophen (TYLENOL) 325 MG tablet Take 2 tablets (650 mg total) by mouth every 6 (six) hours as needed for mild pain. 09/23/16  Yes Lars Pinks M, PA-C  allopurinol (ZYLOPRIM) 100 MG tablet Take 100 mg by mouth daily.   05/03/19  Yes [provider]  amLODipine (NORVASC) 5 MG tablet Take 1 tablet (5 mg total) by mouth daily. 08/03/22  Yes Kathyrn Drown D, NP  atorvastatin (LIPITOR) 80 MG tablet Take 80 mg by mouth daily.   Yes [provider]  benzonatate (TESSALON PERLES) 100 MG capsule Take 1 capsule (100 mg total) by mouth 3 (three) times daily as needed for cough. 08/08/22  Yes Nirel Babler-Warren, Alda Lea, NP  carvedilol (COREG) 25 MG tablet Take 25 mg by mouth 2 (two) times daily with a meal.   Yes [provider]  cetirizine (ZYRTEC) 10 MG tablet Take 1 tablet (10 mg total) by mouth daily. 08/08/22  Yes Ladainian Therien-Warren, Alda Lea, NP  clopidogrel (PLAVIX) 75 MG tablet Take 75 mg by mouth daily.   Yes [provider]  finasteride (PROSCAR) 5 MG tablet Take 5 mg by mouth daily.   Yes [provider]  fluticasone (FLONASE) 50 MCG/ACT nasal spray Place 2 sprays into both nostrils daily. 08/08/22  Yes Feliciano Wynter-Warren, Alda Lea, NP  folic acid (FOLVITE) 1 MG tablet Take 1 mg by mouth daily.   Yes [provider]  furosemide (LASIX) 80 MG tablet Take 1 tablet (80 mg total) by mouth every Tuesday, Thursday, Saturday, and Sunday. 09/05/20  Yes Tat, Shanon Brow, MD  levothyroxine (SYNTHROID, LEVOTHROID) 50 MCG tablet Take 50 mcg by mouth daily before breakfast.   Yes [provider]  lidocaine-prilocaine (EMLA) cream Apply 1 application. topically every Monday, Wednesday, and Friday with hemodialysis.   Yes [provider]  Omega-3 Fatty Acids (RA FISH OIL) 1000 MG CAPS Take 2,000 mg by mouth 2 (two) times daily.   Yes [provider]  omeprazole (PRILOSEC) 40 MG capsule Take 40 mg by mouth daily. 02/14/20  Yes [provider]  sacubitril-valsartan (ENTRESTO) 24-26 MG Take 1 tablet by mouth 2 (two) times daily.   Yes [provider]  carvedilol (COREG) 6.25 MG tablet Take 1 tablet (6.25 mg total) by mouth 2 (two) times daily. Patient not  taking: Reported on 08/03/2022 05/29/21 04/20/22  Arrien, Jimmy Picket, MD  nitroGLYCERIN (NITROSTAT) 0.4 MG SL tablet Place 0.4 mg under the tongue every 5 (five) minutes as needed for chest pain (max 3 doses).    [provider]    Family History Family History  Problem Relation Age of Onset   Heart attack Mother    CVA Mother    CVA Father    Heart attack Brother    Colon cancer Neg Hx    Colon polyps Neg Hx     Social History Social History   Tobacco Use   Smoking status: Former    Packs/day: 1.00  Years: 40.00    Additional pack years: 0.00    Total pack years: 40.00    Types: Cigarettes    Start date: 02/20/1959    Quit date: 09/13/2016    Years since quitting: 5.9   Smokeless tobacco: Never  Vaping Use   Vaping Use: Never used  Substance Use Topics   Alcohol use: No    Alcohol/week: 0.0 standard drinks of alcohol    Comment: "No, not really"   Drug use: No     Allergies   Cardizem cd [diltiazem hcl er beads]   Review of Systems Review of Systems Per HPI  Physical Exam Triage Vital Signs ED Triage Vitals  Enc Vitals Group     BP 08/08/22 0912 (!) 164/69     Pulse Rate 08/08/22 0912 71     Resp 08/08/22 0912 19     Temp 08/08/22 0912 98 F (36.7 C)     Temp Source 08/08/22 0912 Oral     SpO2 08/08/22 0912 94 %     Weight --      Height --      Head Circumference --      Peak Flow --      Pain Score 08/08/22 0916 2     Pain Loc --      Pain Edu? --      Excl. in [place]? --    No data found.  Updated Vital Signs BP (!) 164/69 (BP Location: Right Arm)   Pulse 71   Temp 98 F (36.7 C) (Oral)   Resp 19   SpO2 94%   Visual Acuity Right Eye Distance:   Left Eye Distance:   Bilateral Distance:    Right Eye Near:   Left Eye Near:    Bilateral Near:     Physical Exam Vitals and nursing note reviewed.  Constitutional:      General: He is not in acute distress.    Appearance: Normal appearance.  HENT:     Head: Normocephalic.      Right Ear: Tympanic membrane, ear canal and external ear normal.     Left Ear: Tympanic membrane, ear canal and external ear normal.     Nose: Congestion and rhinorrhea present.     Mouth/Throat:     Mouth: Mucous membranes are moist.     Pharynx: Posterior oropharyngeal erythema present.  Eyes:     Extraocular Movements: Extraocular movements intact.     Conjunctiva/sclera: Conjunctivae normal.     Pupils: Pupils are equal, round, and reactive to light.  Cardiovascular:     Rate and Rhythm: Normal rate and regular rhythm.     Pulses: Normal pulses.     Heart sounds: Normal heart sounds.  Pulmonary:     Effort: Pulmonary effort is normal. No respiratory distress.     Breath sounds: Normal breath sounds. No stridor. No wheezing, rhonchi or rales.  Abdominal:     General: Bowel sounds are normal.     Palpations: Abdomen is soft.     Tenderness: There is no abdominal tenderness.  Musculoskeletal:     Cervical back: Normal range of motion.  Lymphadenopathy:     Cervical: No cervical adenopathy.  Skin:    General: Skin is warm and dry.  Neurological:     General: No focal deficit present.     Mental Status: He is alert and oriented to person, place, and time.  Psychiatric:        Mood and Affect: Mood  normal.        Behavior: Behavior normal.      UC Treatments / Results  Labs (all labs ordered are listed, but only abnormal results are displayed) Labs Reviewed  POCT INFLUENZA A/B    EKG   Radiology No results found.  Procedures Procedures (including critical care time)  Medications Ordered in UC Medications - No data to display  Initial Impression / Assessment and Plan / UC Course  I have reviewed the triage vital signs and the nursing notes.  Pertinent labs & imaging results that were available during my care of the patient were reviewed by me and considered in my medical decision making (see chart for details).  The patient is well-appearing, he is in no  acute distress, vital signs are stable.  Patient presents with sneezing, nasal congestion, runny nose, cough, and headache.  Influenza test was negative.  Symptoms appear to be consistent with allergic rhinitis.  Will treat patient with cetirizine 10 mg daily as an antihistamine, fluticasone 50 micro nasal spray, and Tessalon Perles 100 mg for his cough.  Supportive care recommendations were provided to the patient to include use of over-the-counter Tylenol as needed for pain or discomfort, normal saline nasal spray to help with nasal congestion or runny nose, and use of a humidifier in his bedroom to help with cough.  Patient was given indications of when follow-up may be necessary.  Patient is in agreement with this plan of care and verbalizes understanding.  All questions were answered.  Patient stable for discharge.   Final Clinical Impressions(s) / UC Diagnoses   Final diagnoses:  Allergic rhinitis, unspecified seasonality, unspecified trigger     Discharge Instructions      The influenza test was negative.  As discussed, symptoms appear to be consistent with allergic rhinitis or seasonal allergies. Take medication as prescribed. May take over-the-counter Tylenol as needed for pain, fever, general discomfort. Warm salt water gargles 3-4 times daily as needed for throat pain or discomfort. Recommend the use of normal saline nasal spray throughout the day to help with nasal congestion and runny nose. For the cough, recommend sleeping elevated on pillows and using a humidifier in your bedroom at nighttime during sleep. If symptoms do not improve within the next 7 to 10 days, or if they suddenly worsen, please follow-up with your primary care physician for further evaluation. Follow-up as needed.     ED Prescriptions     Medication Sig Dispense Auth. Provider   benzonatate (TESSALON PERLES) 100 MG capsule Take 1 capsule (100 mg total) by mouth 3 (three) times daily as needed for cough.  30 capsule Delitha Elms-Warren, Alda Lea, NP   fluticasone (FLONASE) 50 MCG/ACT nasal spray Place 2 sprays into both nostrils daily. 16 g Kumari Sculley-Warren, Alda Lea, NP   cetirizine (ZYRTEC) 10 MG tablet Take 1 tablet (10 mg total) by mouth daily. 30 tablet Vivian Okelley-Warren, Alda Lea, NP      PDMP not reviewed this encounter.   Tish Men, NP 08/08/22 1003

## 2022-08-08 NOTE — Discharge Instructions (Addendum)
The influenza test was negative.  As discussed, symptoms appear to be consistent with allergic rhinitis or seasonal allergies. Take medication as prescribed. May take over-the-counter Tylenol as needed for pain, fever, general discomfort. Warm salt water gargles 3-4 times daily as needed for throat pain or discomfort. Recommend the use of normal saline nasal spray throughout the day to help with nasal congestion and runny nose. For the cough, recommend sleeping elevated on pillows and using a humidifier in your bedroom at nighttime during sleep. If symptoms do not improve within the next 7 to 10 days, or if they suddenly worsen, please follow-up with your primary care physician for further evaluation. Follow-up as needed.

## 2022-11-03 DIAGNOSIS — C679 Malignant neoplasm of bladder, unspecified: Secondary | ICD-10-CM | POA: Diagnosis not present

## 2022-12-01 ENCOUNTER — Ambulatory Visit
Admission: EM | Admit: 2022-12-01 | Discharge: 2022-12-01 | Disposition: A | Discharge status: Home / Self Care | Payer: No Typology Code available for payment source | Attending: Family Medicine | Admitting: Family Medicine

## 2022-12-01 DIAGNOSIS — E876 Hypokalemia: Secondary | ICD-10-CM | POA: Diagnosis not present

## 2022-12-01 MED ORDER — POTASSIUM CHLORIDE CRYS ER 20 MEQ PO TBCR: 20.0000 meq | EXTENDED_RELEASE_TABLET | Freq: Two times a day (BID) | ORAL | 0 refills | Status: AC

## 2022-12-01 NOTE — ED Triage Notes (Signed)
Pt is here to see about getting prescription of oral potassium, was told by [facility] doctor today his potassium was 2.8 today and he needed to pick up the prescribed potassium but was sent to [facility] pharmacy and he is not able to get that today, would like some sent around here and will have blood drawn at doctor apt Monday.

## 2022-12-01 NOTE — ED Provider Notes (Signed)
[facility]    CSN: [id number] Arrival date & time: 12/01/22  1603      History   Chief Complaint Chief Complaint  Patient presents with   Medication Refill    HPI [name] is a 77 y.o. male.   Presenting today requesting a prescription for oral potassium.  States he was seen at the [place] this morning and his potassium came in low at 2.8 so he was prescribed a potassium supplement and is to recheck his labs on Monday at the [place].  States the medication went to the wrong pharmacy and the provider was out of office when he called to get it resent to a different pharmacy.  He denies any symptoms at this time and feels in his usual state of health.  He is a dialysis patient and has dialysis in the morning.    Past Medical History:  Diagnosis Date   Acute myocardial infarction of other inferior wall, initial episode of care    Anemia    hx of bld transfusion   Anxiety    Arthritis    Cancer (HCC)    bladder 2013 removed, has cystoscopy 10/2016, has returned x 2   Chronic kidney disease    dialysis - M-W-F at [facility] in [place]   COPD (chronic obstructive pulmonary disease) (HCC)    patient is not aware of this dx   Coronary artery disease    Artery bypass graft Jan 2008   Dialysis patient [facility])    GERD (gastroesophageal reflux disease)    Hearing loss    wears hearing aids   History of blood transfusion 2017   History of hiatal hernia    Hyperlipidemia    Hypertension    Hypothyroidism    PONV (postoperative nausea and vomiting)    only with TURP surgery, no other problems with the other surgeries   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 Clifton Trevino and Dr. Laneta Simmers   Severe aortic stenosis    Sleep apnea    uses CPAP nightly   Stroke (HCC)    Tobacco user    quit 09/2016    Patient Active Problem List   Diagnosis Date Noted   Multiple lung nodules on CT 04/21/2022   S/P TAVR (transcatheter aortic  valve replacement) 08/10/2021   Aortic stenosis 05/26/2021   Abdominal pain 04/05/2021   Cholelithiasis 04/05/2021   Hypertensive urgency 04/05/2021   Adenoma of right adrenal gland 04/05/2021   Elevated lipase 04/05/2021   Hypothyroidism (acquired) 04/05/2021   GERD (gastroesophageal reflux disease) 04/05/2021   History of COPD 04/05/2021   Malignant neoplasm of overlapping sites of bladder (HCC) 09/07/2020   End-stage renal disease on hemodialysis (HCC) 09/02/2020   Rectal bleeding    Acute diastolic CHF (congestive heart failure) (HCC) 05/09/2020   Acute exacerbation of Diastolic CHF (congestive heart failure) (HCC) 05/08/2020   Symptomatic bradycardia with heart failure 05/08/2020   COPD without exacerbation (HCC)---40 Pack Years 05/08/2020   Acute respiratory failure with hypoxia (HCC) 05/08/2020   Uremia in the setting of AKI on CKD V 02/07/2020   CKD (chronic kidney disease), stage V (HCC) 02/07/2020   Coronary artery disease involving native coronary artery of native heart without angina pectoris 12/16/2019   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   Gastroenteritis 12/16/2019   History of bladder cancer 12/16/2019   Hyperkalemia 12/16/2019   Renal lesion 05/16/2019   Proteinuria 03/28/2019   Acute blood  loss anemia 06/08/2018   Anemia in chronic kidney disease 06/08/2018   BPH (benign prostatic hyperplasia) 06/08/2018   Coronary artery disease with unstable angina pectoris (HCC) 06/08/2018   Elevated troponin 06/08/2018   GIB (gastrointestinal bleeding) 06/07/2018   (HFpEF) heart failure with preserved ejection fraction (HCC) 06/30/2017   Coronary artery disease involving autologous artery coronary bypass graft 06/29/2017   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/19/2016   NSTEMI (non-ST elevated myocardial infarction) (HCC) 09/14/2016   Chest pain 09/14/2016   Obstructive sleep apnea 09/14/2016   Acute myocardial infarction of other inferior wall, initial episode of care     Unstable angina (HCC) 01/30/2014   OVERWEIGHT/OBESITY 02/02/2010   HYPERLIPIDEMIA, MIXED 01/22/2009   OTHER CHEST PAIN 01/22/2009   TOBACCO USER 01/17/2009   Essential hypertension 01/17/2009    Past Surgical History:  Procedure Laterality Date   APPENDECTOMY     AV FISTULA PLACEMENT Right 02/11/2020   Procedure: RIGHT ARM BRACHIOCEPHALIC ARTERIOVENOUS (AV) FISTULA;  Surgeon: Chuck Hint, MD;  Location: [facility] OR;  Service: Vascular;  Laterality: Right;   CARDIAC CATHETERIZATION  05/28/2021   COLONOSCOPY WITH PROPOFOL N/A 09/02/2020   Procedure: COLONOSCOPY WITH PROPOFOL;  Surgeon: Malissa Hippo, MD;  Location: [facility];  Service: Endoscopy;  Laterality: N/A;   CORONARY ARTERY BYPASS GRAFT  06/14/2006   5 blockages   CORONARY ARTERY BYPASS GRAFT N/A 09/19/2016   Procedure: REDO CORONARY ARTERY BYPASS GRAFTING (CABG) x two, using right internal mammary artery and left radial artery;  Surgeon: Alleen Borne, MD;  Location: [facility] OR;  Service: Open Heart Surgery;  Laterality: N/A;   FOOT SURGERY Right    HEMOSTASIS CLIP PLACEMENT  09/02/2020   Procedure: HEMOSTASIS CLIP PLACEMENT;  Surgeon: Malissa Hippo, MD;  Location: [facility];  Service: Endoscopy;;   HERNIA REPAIR     x 3 surgeries   HOT HEMOSTASIS  09/02/2020   Procedure: HOT HEMOSTASIS (ARGON PLASMA COAGULATION/BICAP);  Surgeon: Malissa Hippo, MD;  Location: [facility];  Service: Endoscopy;;   LEFT HEART CATH AND CORS/GRAFTS ANGIOGRAPHY N/A 09/14/2016   Procedure: Left Heart Cath and Cors/Grafts Angiography;  Surgeon: Lennette Bihari, MD;  Location: [facility] LAB;  Service: Cardiovascular;  Laterality: N/A;   LEFT HEART CATHETERIZATION WITH CORONARY/GRAFT ANGIOGRAM N/A 01/31/2014   Procedure: LEFT HEART CATHETERIZATION WITH Isabel Caprice;  Surgeon: Thurmon Fair, MD;  Location: [facility] CATH LAB;  Service: Cardiovascular;  Laterality: N/A;   PERCUTANEOUS CORONARY STENT INTERVENTION (PCI-S)   01/31/2014   Procedure: PERCUTANEOUS CORONARY STENT INTERVENTION (PCI-S);  Surgeon: Thurmon Fair, MD;  Location: [facility] CATH LAB;  Service: Cardiovascular;;   POLYPECTOMY  09/02/2020   Procedure: POLYPECTOMY;  Surgeon: Malissa Hippo, MD;  [facility];  Service: Endoscopy;;   RADIAL ARTERY HARVEST Left 09/19/2016   Procedure: RADIAL ARTERY HARVEST;  Surgeon: Alleen Borne, MD;  Location: [facility] OR;  Service: Open Heart Surgery;  Laterality: Left;   RIGHT/LEFT HEART CATH AND CORONARY/GRAFT ANGIOGRAPHY N/A 05/28/2021   Procedure: RIGHT/LEFT HEART CATH AND CORONARY/GRAFT ANGIOGRAPHY;  Surgeon: Kathleene Hazel, MD;  Location: [facility];  Service: Cardiovascular;  Laterality: N/A;   TEE WITHOUT CARDIOVERSION N/A 09/19/2016   Procedure: TRANSESOPHAGEAL ECHOCARDIOGRAM (TEE);  Surgeon: Alleen Borne, MD;  Location: [facility] OR;  Service: Open Heart Surgery;  Laterality: N/A;   TEE WITHOUT CARDIOVERSION N/A 08/10/2021   Procedure: TRANSESOPHAGEAL ECHOCARDIOGRAM (TEE);  Surgeon: Kathleene Hazel, MD;  Location: [facility] OR;  Service: Open  Heart Surgery;  Laterality: N/A;   TRANSCATHETER AORTIC VALVE REPLACEMENT, TRANSAPICAL N/A 08/10/2021   Procedure: Transcatheter Aortic Valve Replacement-Transapical;  Surgeon: Kathleene Hazel, MD;  Location: [facility] OR;  Service: Open Heart Surgery;  Laterality: N/A;   TRANSURETHRAL RESECTION BLADDER TUMOR and bilateral RPG's  10/07/2020   [facility] Medications    Prior to Admission medications   Medication Sig Start Date End Date Taking? Authorizing Provider  acetaminophen (TYLENOL) 325 MG tablet Take 2 tablets (650 mg total) by mouth every 6 (six) hours as needed for mild pain. 09/23/16  Yes Doree Fudge M, PA-C  allopurinol (ZYLOPRIM) 100 MG tablet Take 100 mg by mouth daily.  05/03/19  Yes [provider]  amLODipine (NORVASC) 5 MG tablet Take 1 tablet (5 mg total) by mouth daily. 08/03/22  Yes Georgie Chard  D, NP  atorvastatin (LIPITOR) 80 MG tablet Take 80 mg by mouth daily.   Yes [provider]  carvedilol (COREG) 25 MG tablet Take 25 mg by mouth 2 (two) times daily with a meal.   Yes [provider]  clopidogrel (PLAVIX) 75 MG tablet Take 75 mg by mouth daily.   Yes [provider]  finasteride (PROSCAR) 5 MG tablet Take 5 mg by mouth daily.   Yes [provider]  folic acid (FOLVITE) 1 MG tablet Take 1 mg by mouth daily.   Yes [provider]  furosemide (LASIX) 80 MG tablet Take 1 tablet (80 mg total) by mouth every Tuesday, Thursday, Saturday, and Sunday. 09/05/20  Yes Tat, Onalee Hua, MD  levothyroxine (SYNTHROID, LEVOTHROID) 50 MCG tablet Take 50 mcg by mouth daily before breakfast.   Yes [provider]  lidocaine-prilocaine (EMLA) cream Apply 1 application. topically every Monday, Wednesday, and Friday with hemodialysis.   Yes [provider]  Omega-3 Fatty Acids (RA FISH OIL) 1000 MG CAPS Take 2,000 mg by mouth 2 (two) times daily.   Yes [provider]  omeprazole (PRILOSEC) 40 MG capsule Take 40 mg by mouth daily. 02/14/20  Yes [provider]  potassium chloride SA (KLOR-CON M) 20 MEQ tablet Take 1 tablet (20 mEq total) by mouth 2 (two) times daily. 12/01/22  Yes Particia Nearing, PA-C  sacubitril-valsartan (ENTRESTO) 24-26 MG Take 1 tablet by mouth 2 (two) times daily.   Yes [provider]  benzonatate (TESSALON PERLES) 100 MG capsule Take 1 capsule (100 mg total) by mouth 3 (three) times daily as needed for cough. 08/08/22   Leath-Warren, Sadie Haber, NP  carvedilol (COREG) 6.25 MG tablet Take 1 tablet (6.25 mg total) by mouth 2 (two) times daily. Patient not taking: Reported on 08/03/2022 05/29/21 04/20/22  Arrien, York Ram, MD  cetirizine (ZYRTEC) 10 MG tablet Take 1 tablet (10 mg total) by mouth daily. 08/08/22   Leath-Warren, Sadie Haber, NP  fluticasone (FLONASE) 50 MCG/ACT nasal spray Place 2  sprays into both nostrils daily. 08/08/22   Leath-Warren, Sadie Haber, NP  nitroGLYCERIN (NITROSTAT) 0.4 MG SL tablet Place 0.4 mg under the tongue every 5 (five) minutes as needed for chest pain (max 3 doses).    [provider]    Family History Family History  Problem Relation Age of Onset   Heart attack Mother    CVA Mother    CVA Father    Heart attack Brother    Colon cancer Neg Hx    Colon polyps Neg Hx     Social History Social History  Tobacco Use   Smoking status: Former    Current packs/day: 0.00    Average packs/day: 1 pack/day for 57.6 years (57.6 ttl pk-yrs)    Types: Cigarettes    Start date: 02/20/1959    Quit date: 09/13/2016    Years since quitting: 6.2   Smokeless tobacco: Never  Vaping Use   Vaping status: Never Used  Substance Use Topics   Alcohol use: No    Alcohol/week: 0.0 standard drinks of alcohol    Comment: "No, not really"   Drug use: No     Allergies   Cardizem cd [diltiazem hcl er beads]   Review of Systems Review of Systems Per HPI  Physical Exam Triage Vital Signs ED Triage Vitals [12/01/22 1708]  Encounter Vitals Group     BP (!) 146/58     Systolic BP Percentile      Diastolic BP Percentile      Pulse Rate (!) 59     Resp 16     Temp 97.6 F (36.4 C)     Temp Source Oral     SpO2 95 %     Weight      Height      Head Circumference      Peak Flow      Pain Score 0     Pain Loc      Pain Education      Exclude from Growth Chart    No data found.  Updated Vital Signs BP (!) 146/58 (BP Location: Right Arm)   Pulse (!) 59   Temp 97.6 F (36.4 C) (Oral)   Resp 16   SpO2 95%   Visual Acuity Right Eye Distance:   Left Eye Distance:   Bilateral Distance:    Right Eye Near:   Left Eye Near:    Bilateral Near:     Physical Exam Vitals and nursing note reviewed.  Constitutional:      Appearance: Normal appearance.  HENT:     Head: Atraumatic.  Eyes:     Extraocular Movements: Extraocular movements  intact.     Conjunctiva/sclera: Conjunctivae normal.  Cardiovascular:     Rate and Rhythm: Normal rate and regular rhythm.  Pulmonary:     Effort: Pulmonary effort is normal.     Breath sounds: Normal breath sounds.  Musculoskeletal:        General: Normal range of motion.     Cervical back: Normal range of motion and neck supple.  Skin:    General: Skin is warm and dry.  Neurological:     General: No focal deficit present.     Mental Status: He is oriented to person, place, and time.  Psychiatric:        Mood and Affect: Mood normal.        Thought Content: Thought content normal.        Judgment: Judgment normal.      UC Treatments / Results  Labs (all labs ordered are listed, but only abnormal results are displayed) Labs Reviewed - No data to display  EKG   Radiology No results found.  Procedures Procedures (including critical care time)  Medications Ordered in UC Medications - No data to display  Initial Impression / Assessment and Plan / UC Course  I have reviewed the triage vital signs and the nursing notes.  Pertinent labs & imaging results that were available during my care of the patient were reviewed by me and considered in my medical decision  making (see chart for details).     Oral potassium prescribed, follow-up [facility] and [facility].  ED for worsening symptoms at any time  Final Clinical Impressions(s) / UC Diagnoses   Final diagnoses:  Hypokalemia   Discharge Instructions   None    ED Prescriptions     Medication Sig Dispense Auth. Provider   potassium chloride SA (KLOR-CON M) 20 MEQ tablet Take 1 tablet (20 mEq total) by mouth 2 (two) times daily. 10 tablet Particia Nearing, [place]      PDMP not reviewed this encounter.   Particia Nearing, [place] 12/01/22 1812

## 2023-06-25 ENCOUNTER — Ambulatory Visit
Admission: EM | Admit: 2023-06-25 | Discharge: 2023-06-25 | Disposition: A | Discharge status: Home / Self Care | Payer: No Typology Code available for payment source | Attending: Family Medicine | Admitting: Family Medicine

## 2023-06-25 ENCOUNTER — Encounter: Payer: Self-pay | Admitting: Emergency Medicine

## 2023-06-25 ENCOUNTER — Other Ambulatory Visit: Payer: Self-pay

## 2023-06-25 DIAGNOSIS — J441 Chronic obstructive pulmonary disease with (acute) exacerbation: Secondary | ICD-10-CM

## 2023-06-25 DIAGNOSIS — J069 Acute upper respiratory infection, unspecified: Secondary | ICD-10-CM

## 2023-06-25 LAB — POC COVID19/FLU A&B COMBO
Covid Antigen, POC: NEGATIVE
Influenza A Antigen, POC: NEGATIVE
Influenza B Antigen, POC: NEGATIVE

## 2023-06-25 MED ORDER — DEXAMETHASONE SODIUM PHOSPHATE 10 MG/ML IJ SOLN
10.0000 mg | Freq: Once | INTRAMUSCULAR | Status: AC
  Administered 2023-06-25: 10 mg via INTRAMUSCULAR

## 2023-06-25 MED ORDER — FLUTICASONE PROPIONATE 50 MCG/ACT NA SUSP: 1.0000 | Freq: Two times a day (BID) | NASAL | 2 refills | Status: AC

## 2023-06-25 MED ORDER — ALBUTEROL SULFATE HFA 108 (90 BASE) MCG/ACT IN AERS: 2.0000 | INHALATION_SPRAY | RESPIRATORY_TRACT | 0 refills | Status: AC | PRN

## 2023-06-25 NOTE — ED Triage Notes (Signed)
 Pt reports generalized weakness, fatigue, body aches, runny nose, cough,sore throat since dialysis on Friday.

## 2023-06-25 NOTE — ED Provider Notes (Signed)
 [facility]    CSN: [id number] Arrival date & time: 06/25/23  0854      History   Chief Complaint Chief Complaint  Patient presents with   Generalized Body Aches    HPI [name] is a 78 y.o. male.   Patient presenting today with 2-day history of weakness, fatigue, body aches, rhinorrhea, cough, wheezing, sore throat.  Denies fever, chest pain, shortness of breath, abdominal pain, nausea vomiting or diarrhea.  States symptoms started shortly after getting dialysis on Friday.  He has a history of COPD not currently on any inhalers, end-stage renal disease on dialysis, history of CVA, hypertension, hyperlipidemia, anemia, history of MI.    Past Medical History:  Diagnosis Date   Acute myocardial infarction of other inferior wall, initial episode of care    Anemia    hx of bld transfusion   Anxiety    Arthritis    Cancer (HCC)    bladder 2013 removed, has cystoscopy 10/2016, has returned x 2   Chronic kidney disease    dialysis - M-W-F at [facility] in [place]   COPD (chronic obstructive pulmonary disease) (HCC)    patient is not aware of this dx   Coronary artery disease    Artery bypass graft Jan 2008   Dialysis patient [facility])    GERD (gastroesophageal reflux disease)    Hearing loss    wears hearing aids   History of blood transfusion 2017   History of hiatal hernia    Hyperlipidemia    Hypertension    Hypothyroidism    PONV (postoperative nausea and vomiting)    only with TURP surgery, no other problems with the other surgeries   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 Verlin and Dr. 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   Aortic stenosis 05/26/2021    Abdominal pain 04/05/2021   Cholelithiasis 04/05/2021   Hypertensive urgency 04/05/2021   Adenoma of right adrenal gland 04/05/2021   Elevated lipase 04/05/2021   Hypothyroidism (acquired) 04/05/2021   GERD (gastroesophageal reflux disease) 04/05/2021   History of COPD 04/05/2021   Malignant neoplasm of overlapping sites of bladder (HCC) 09/07/2020   End-stage renal disease on hemodialysis (HCC) 09/02/2020   Rectal bleeding    Acute diastolic CHF (congestive heart failure) (HCC) 05/09/2020   Acute exacerbation of Diastolic CHF (congestive heart failure) (HCC) 05/08/2020   Symptomatic bradycardia with heart failure 05/08/2020   COPD without exacerbation (HCC)---40 Pack Years 05/08/2020   Acute respiratory failure with hypoxia (HCC) 05/08/2020   Uremia in the setting of AKI on CKD V 02/07/2020   CKD (chronic kidney disease), stage V (HCC) 02/07/2020   Coronary artery disease involving native coronary artery of native heart without angina pectoris 12/16/2019   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   Gastroenteritis 12/16/2019   History of bladder cancer 12/16/2019   Hyperkalemia 12/16/2019   Renal lesion 05/16/2019   Proteinuria 03/28/2019   Acute blood loss anemia 06/08/2018   Anemia in chronic kidney disease 06/08/2018   BPH (benign prostatic hyperplasia) 06/08/2018   Coronary artery disease with unstable angina pectoris (HCC) 06/08/2018   Elevated  troponin 06/08/2018   GIB (gastrointestinal bleeding) 06/07/2018   (HFpEF) heart failure with preserved ejection fraction (HCC) 06/30/2017   Coronary artery disease involving autologous artery coronary bypass graft 06/29/2017   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/19/2016   NSTEMI (non-ST elevated myocardial infarction) (HCC) 09/14/2016   Chest pain 09/14/2016   Obstructive sleep apnea 09/14/2016   Acute myocardial infarction of other inferior wall, initial episode of care    Unstable angina (HCC) 01/30/2014   Overweight 02/02/2010    HYPERLIPIDEMIA, MIXED 01/22/2009   OTHER CHEST PAIN 01/22/2009   TOBACCO USER 01/17/2009   Essential hypertension 01/17/2009    Past Surgical History:  Procedure Laterality Date   APPENDECTOMY     AV FISTULA PLACEMENT Right 02/11/2020   Procedure: RIGHT ARM BRACHIOCEPHALIC ARTERIOVENOUS (AV) FISTULA;  Surgeon: Eliza Lonni RAMAN, MD;  Location[facility] OR;  Service: Vascular;  Laterality: Right;   CARDIAC CATHETERIZATION  05/28/2021   COLONOSCOPY WITH PROPOFOL  N/A 09/02/2020   Procedure: COLONOSCOPY WITH PROPOFOL ;  Surgeon: Golda Claudis PENNER, MD;  Location: [facility];  Service: Endoscopy;  Laterality: N/A;   CORONARY ARTERY BYPASS GRAFT  06/14/2006   5 blockages   CORONARY ARTERY BYPASS GRAFT N/A 09/19/2016   Procedure: REDO CORONARY ARTERY BYPASS GRAFTING (CABG) x two, using right internal mammary artery and left radial artery;  Surgeon: Lucas Dorise POUR, MD;  Location: [facility] OR;  Service: Open Heart Surgery;  Laterality: N/A;   FOOT SURGERY Right    HEMOSTASIS CLIP PLACEMENT  09/02/2020   Procedure: HEMOSTASIS CLIP PLACEMENT;  Surgeon: Golda Claudis PENNER, MD;  Location: [facility];  Service: Endoscopy;;   HERNIA REPAIR     x 3 surgeries   HOT HEMOSTASIS  09/02/2020   Procedure: HOT HEMOSTASIS (ARGON PLASMA COAGULATION/BICAP);  Surgeon: Golda Claudis PENNER, MD;  Location: [facility];  Service: Endoscopy;;   LEFT HEART CATH AND CORS/GRAFTS ANGIOGRAPHY N/A 09/14/2016   Procedure: Left Heart Cath and Cors/Grafts Angiography;  Surgeon: Debby DELENA Sor, MD;  Location: [facility] INVASIVE CV LAB;  Service: Cardiovascular;  Laterality: N/A;   LEFT HEART CATHETERIZATION WITH CORONARY/GRAFT ANGIOGRAM N/A 01/31/2014   Procedure: LEFT HEART CATHETERIZATION WITH EL BILE;  Surgeon: Jerel Balding, MD;  Location: [facility] CATH LAB;  Service: Cardiovascular;  Laterality: N/A;   PERCUTANEOUS CORONARY STENT INTERVENTION (PCI-S)  01/31/2014   Procedure: PERCUTANEOUS CORONARY STENT INTERVENTION (PCI-S);   Surgeon: Jerel Balding, MD;  Location: [facility] CATH LAB;  Service: Cardiovascular;;   POLYPECTOMY  09/02/2020   Procedure: POLYPECTOMY;  Surgeon: Golda Claudis PENNER, MD;  Location: [facility];  Service: Endoscopy;;   RADIAL ARTERY HARVEST Left 09/19/2016   Procedure: RADIAL ARTERY HARVEST;  Surgeon: Lucas Dorise POUR, MD;  Location: [facility] OR;  Service: Open Heart Surgery;  Laterality: Left;   RIGHT/LEFT HEART CATH AND CORONARY/GRAFT ANGIOGRAPHY N/A 05/28/2021   Procedure: RIGHT/LEFT HEART CATH AND CORONARY/GRAFT ANGIOGRAPHY;  Surgeon: Verlin Lonni BIRCH, MD;  Location: [facility]AS[facility] CV LAB;  Service: Cardiovascular;  Laterality: N/A;   TEE WITHOUT CARDIOVERSION N/A 09/19/2016   Procedure: TRANSESOPHAGEAL ECHOCARDIOGRAM (TEE);  Surgeon: Lucas Dorise POUR, MD;  Location: [facility] OR;  Service: Open Heart Surgery;  Laterality: N/A;   TEE WITHOUT CARDIOVERSION N/A 08/10/2021   Procedure: TRANSESOPHAGEAL ECHOCARDIOGRAM (TEE);  Surgeon: Verlin Lonni BIRCH, MD;  Location: [facility] OR;  Service: Open Heart Surgery;  Laterality: N/A;   TRANSCATHETER AORTIC VALVE REPLACEMENT, TRANSAPICAL N/A 08/10/2021   Procedure: Transcatheter Aortic Valve Replacement-Transapical;  Surgeon: Verlin Lonni BIRCH, MD;  Location: [facility] OR;  Service: Open Heart Surgery;  Laterality: N/A;   TRANSURETHRAL RESECTION BLADDER TUMOR and bilateral RPG's  10/07/2020   [facility] Medications    Prior to Admission medications   Medication Sig Start Date End Date Taking? Authorizing Provider  albuterol  (VENTOLIN  HFA) 108 (90 Base) MCG/ACT inhaler Inhale 2 puffs into the lungs every 4 (four) hours as needed. 06/25/23  Yes Stuart Vernell Norris, PA-C  fluticasone  (FLONASE ) 50 MCG/ACT nasal spray Place 1 spray into both nostrils 2 (two) times daily. 06/25/23  Yes Stuart Vernell Norris, PA-C  acetaminophen  (TYLENOL ) 325 MG tablet Take 2 tablets (650 mg total) by mouth every 6 (six) hours as needed for mild pain. 09/23/16   Dwan Kyla HERO, PA-C  allopurinol  (ZYLOPRIM ) 100 MG tablet Take 100 mg by mouth daily.  05/03/19   [provider]  amLODipine  (NORVASC ) 5 MG tablet Take 1 tablet (5 mg total) by mouth daily. 08/03/22   McDaniel, Jill D, NP  atorvastatin  (LIPITOR ) 80 MG tablet Take 80 mg by mouth daily.    [provider]  benzonatate  (TESSALON  PERLES) 100 MG capsule Take 1 capsule (100 mg total) by mouth 3 (three) times daily as needed for cough. 08/08/22   Leath-Warren, Etta PARAS, NP  carvedilol  (COREG ) 25 MG tablet Take 25 mg by mouth 2 (two) times daily with a meal.    [provider]  carvedilol  (COREG ) 6.25 MG tablet Take 1 tablet (6.25 mg total) by mouth 2 (two) times daily. Patient not taking: Reported on 08/03/2022 05/29/21 04/20/22  Arrien, Elidia Sieving, MD  cetirizine  (ZYRTEC ) 10 MG tablet Take 1 tablet (10 mg total) by mouth daily. 08/08/22   Leath-Warren, Etta PARAS, NP  clopidogrel  (PLAVIX ) 75 MG tablet Take 75 mg by mouth daily.    [provider]  finasteride  (PROSCAR ) 5 MG tablet Take 5 mg by mouth daily.    [provider]  fluticasone  (FLONASE ) 50 MCG/ACT nasal spray Place 2 sprays into both nostrils daily. 08/08/22   Leath-Warren, Etta PARAS, NP  folic acid  (FOLVITE ) 1 MG tablet Take 1 mg by mouth daily.    [provider]  furosemide  (LASIX ) 80 MG tablet Take 1 tablet (80 mg total) by mouth every Tuesday, Thursday, Saturday, and Sunday. 09/05/20   Evonnie Lenis, MD  levothyroxine  (SYNTHROID , LEVOTHROID) 50 MCG tablet Take 50 mcg by mouth daily before breakfast.    [provider]  lidocaine -prilocaine  (EMLA ) cream Apply 1 application. topically every Monday, Wednesday, and Friday with hemodialysis.    [provider]  nitroGLYCERIN  (NITROSTAT ) 0.4 MG SL tablet Place 0.4 mg under the tongue every 5 (five) minutes as needed for chest pain (max 3 doses).    [provider]  Omega-3 Fatty Acids (RA FISH OIL ) 1000 MG CAPS Take  2,000 mg by mouth 2 (two) times daily.    [provider]  omeprazole (PRILOSEC) 40 MG capsule Take 40 mg by mouth daily. 02/14/20   [provider]  potassium chloride  SA (KLOR-CON  M) 20 MEQ tablet Take 1 tablet (20 mEq total) by mouth 2 (two) times daily. 12/01/22   Stuart Vernell Norris, PA-C  sacubitril-valsartan (ENTRESTO) 24-26 MG Take 1 tablet by mouth 2 (two) times daily.    [provider]    Family History Family History  Problem Relation Age of Onset   Heart attack Mother    CVA Mother    CVA Father    Heart attack Brother  Colon cancer Neg Hx    Colon polyps Neg Hx     Social History Social History   Tobacco Use   Smoking status: Former    Current packs/day: 0.00    Average packs/day: 1 pack/day for 57.6 years (57.6 ttl pk-yrs)    Types: Cigarettes    Start date: 02/20/1959    Quit date: 09/13/2016    Years since quitting: 6.7   Smokeless tobacco: Never  Vaping Use   Vaping status: Never Used  Substance Use Topics   Alcohol use: No    Alcohol/week: 0.0 standard drinks of alcohol    Comment: No, not really   Drug use: No     Allergies   Cardizem cd [diltiazem hcl er beads]   Review of Systems Review of Systems Per HPI  Physical Exam Triage Vital Signs ED Triage Vitals [06/25/23 1103]  Encounter Vitals Group     BP (!) 141/80     Systolic BP Percentile      Diastolic BP Percentile      Pulse Rate 71     Resp 20     Temp 98 F (36.7 C)     Temp Source Oral     SpO2 91 %     Weight      Height      Head Circumference      Peak Flow      Pain Score 0     Pain Loc      Pain Education      Exclude from Growth Chart    No data found.  Updated Vital Signs BP (!) 141/80 (BP Location: Right Arm)   Pulse 71   Temp 98 F (36.7 C) (Oral)   Resp 20   SpO2 91%   Visual Acuity Right Eye Distance:   Left Eye Distance:   Bilateral Distance:    Right Eye Near:   Left Eye Near:    Bilateral Near:     Physical  Exam Vitals and nursing note reviewed.  Constitutional:      Appearance: He is well-developed.  HENT:     Head: Atraumatic.     Right Ear: External ear normal.     Left Ear: External ear normal.     Nose: Rhinorrhea present.     Mouth/Throat:     Pharynx: Posterior oropharyngeal erythema present. No oropharyngeal exudate.  Eyes:     Conjunctiva/sclera: Conjunctivae normal.     Pupils: Pupils are equal, round, and reactive to light.  Cardiovascular:     Rate and Rhythm: Normal rate and regular rhythm.  Pulmonary:     Effort: Pulmonary effort is normal. No respiratory distress.     Breath sounds: Wheezing present. No rales.  Musculoskeletal:        General: Normal range of motion.     Cervical back: Normal range of motion and neck supple.  Lymphadenopathy:     Cervical: No cervical adenopathy.  Skin:    General: Skin is warm and dry.  Neurological:     Mental Status: He is alert and oriented to person, place, and time.  Psychiatric:        Behavior: Behavior normal.      UC Treatments / Results  Labs (all labs ordered are listed, but only abnormal results are displayed) Labs Reviewed  POC COVID19/FLU A&B COMBO    EKG   Radiology No results found.  Procedures Procedures (including critical care time)  Medications Ordered in UC Medications  dexamethasone  (DECADRON ) injection 10 mg (10 mg Intramuscular Given 06/25/23 1152)    Initial Impression / Assessment and Plan / UC Course  I have reviewed the triage vital signs and the nursing notes.  Pertinent labs & imaging results that were available during my care of the patient were reviewed by me and considered in my medical decision making (see chart for details).     Oxygen saturation 91% on room air but he appears well and in no acute respiratory distress.  Do suspect viral upper respiratory infection causing a COPD exacerbation.  Rapid flu COVID are negative so we will forego viral medication at this time.  Will  treat with IM Decadron , albuterol , Flonase , supportive over-the-counter medications and home care additionally.  Strict return precautions given for any worsening symptoms.  Final Clinical Impressions(s) / UC Diagnoses   Final diagnoses:  Viral URI with cough  COPD exacerbation [facility])   Discharge Instructions   None    ED Prescriptions     Medication Sig Dispense Auth. Provider   albuterol  (VENTOLIN  HFA) 108 (90 Base) MCG/ACT inhaler Inhale 2 puffs into the lungs every 4 (four) hours as needed. 18 g Stuart Vernell Norris, PA-C   fluticasone  (FLONASE ) 50 MCG/ACT nasal spray Place 1 spray into both nostrils 2 (two) times daily. 16 g Stuart Vernell Norris, [place]      PDMP not reviewed this encounter.   Stuart Vernell Norris, [place] 06/25/23 1211

## 2023-08-20 ENCOUNTER — Other Ambulatory Visit: Payer: Self-pay

## 2023-08-20 ENCOUNTER — Encounter (HOSPITAL_COMMUNITY): Payer: Self-pay

## 2023-08-20 ENCOUNTER — Emergency Department (HOSPITAL_COMMUNITY)

## 2023-08-20 ENCOUNTER — Emergency Department (HOSPITAL_COMMUNITY)
Admission: EM | Admit: 2023-08-20 | Discharge: 2023-08-20 | Disposition: A | Discharge status: Home / Self Care | Attending: Emergency Medicine | Admitting: Emergency Medicine

## 2023-08-20 DIAGNOSIS — Z992 Dependence on renal dialysis: Secondary | ICD-10-CM | POA: Diagnosis not present

## 2023-08-20 DIAGNOSIS — I509 Heart failure, unspecified: Secondary | ICD-10-CM | POA: Insufficient documentation

## 2023-08-20 DIAGNOSIS — Z7901 Long term (current) use of anticoagulants: Secondary | ICD-10-CM | POA: Diagnosis not present

## 2023-08-20 DIAGNOSIS — R2242 Localized swelling, mass and lump, left lower limb: Secondary | ICD-10-CM | POA: Diagnosis not present

## 2023-08-20 DIAGNOSIS — N186 End stage renal disease: Secondary | ICD-10-CM | POA: Insufficient documentation

## 2023-08-20 DIAGNOSIS — R0602 Shortness of breath: Secondary | ICD-10-CM | POA: Diagnosis present

## 2023-08-20 LAB — COMPREHENSIVE METABOLIC PANEL WITH GFR
ALT: 10 U/L (ref 0–44)
AST: 16 U/L (ref 15–41)
Albumin: 3.3 g/dL — ABNORMAL LOW (ref 3.5–5.0)
Alkaline Phosphatase: 87 U/L (ref 38–126)
Anion gap: 14 (ref 5–15)
BUN: 33 mg/dL — ABNORMAL HIGH (ref 8–23)
CO2: 24 mmol/L (ref 22–32)
Calcium: 9 mg/dL (ref 8.9–10.3)
Chloride: 97 mmol/L — ABNORMAL LOW (ref 98–111)
Creatinine, Ser: 5.65 mg/dL — ABNORMAL HIGH (ref 0.61–1.24)
GFR, Estimated: 10 mL/min — ABNORMAL LOW (ref >60)
Glucose, Bld: 95 mg/dL (ref 70–99)
Potassium: 4 mmol/L (ref 3.5–5.1)
Sodium: 135 mmol/L (ref 135–145)
Total Bilirubin: 1 mg/dL (ref 0.0–1.2)
Total Protein: 7.5 g/dL (ref 6.5–8.1)

## 2023-08-20 LAB — CBC WITH DIFFERENTIAL/PLATELET
Abs Immature Granulocytes: 0.01 10*3/uL (ref 0.00–0.07)
Basophils Absolute: 0 10*3/uL (ref 0.0–0.1)
Basophils Relative: 1 %
Eosinophils Absolute: 0 10*3/uL (ref 0.0–0.5)
Eosinophils Relative: 1 %
HCT: 33.8 % — ABNORMAL LOW (ref 39.0–52.0)
Hemoglobin: 10.6 g/dL — ABNORMAL LOW (ref 13.0–17.0)
Immature Granulocytes: 0 %
Lymphocytes Relative: 16 %
Lymphs Abs: 0.6 10*3/uL — ABNORMAL LOW (ref 0.7–4.0)
MCH: 28 pg (ref 26.0–34.0)
MCHC: 31.4 g/dL (ref 30.0–36.0)
MCV: 89.2 fL (ref 80.0–100.0)
Monocytes Absolute: 0.3 10*3/uL (ref 0.1–1.0)
Monocytes Relative: 8 %
Neutro Abs: 2.8 10*3/uL (ref 1.7–7.7)
Neutrophils Relative %: 74 %
Platelets: 121 10*3/uL — ABNORMAL LOW (ref 150–400)
RBC: 3.79 MIL/uL — ABNORMAL LOW (ref 4.22–5.81)
RDW: 18.9 % — ABNORMAL HIGH (ref 11.5–15.5)
WBC: 3.8 10*3/uL — ABNORMAL LOW (ref 4.0–10.5)
nRBC: 0 % (ref 0.0–0.2)

## 2023-08-20 NOTE — ED Triage Notes (Signed)
 Pt on a MWF Dialysis schedule and had tx on Friday but believes he has too much fluid because he is having SOB. Pt does not wear O2 at home.

## 2023-08-20 NOTE — Discharge Instructions (Signed)
 Painfully your testing has not shown that you are holding down too much fluid.  For the rest of the day please to stay at home, do not drink any more liquids other than maybe 1 glass of water and make sure that you are going to your dialysis tomorrow.  If symptoms become severe or worsen return to the ER

## 2023-08-20 NOTE — ED Provider Notes (Signed)
 [place] EMERGENCY DEPARTMENT AT [facility] Provider Note   CSN: [id number] Arrival date & time: 08/20/23  1149     History  Chief Complaint  Patient presents with   Shortness of Breath    [name] is a 78 y.o. male.   Shortness of Breath  This patient is a 78 year old male with a history of end-stage renal disease on dialysis for the last 3 years, his access is in the right upper extremity.  He also has a history of heart disease status post valve replacement and bypass.  The patient reports to me that he has a history of having a shortened dialysis session approximately 2 weeks ago when he became nauseated, he has made every single dialysis session since that time on Wednesday then Friday then the following Monday Wednesday and Friday including 2 days ago and has completed the entire session for all of those.  He reports that today he felt like he was more short of breath and tachypneic, he feels like he is probably having too much fluid on him and took extra Lasix which has not helped that much.  He had lots of swelling in his legs last night but when he woke up this morning they were better.  He feels like his abdomen is slightly distended as well.  No fever no cough no phlegm no chest pain no back pain no nausea or vomiting.    Home Medications Prior to Admission medications   Medication Sig Start Date End Date Taking? Authorizing Provider  acetaminophen (TYLENOL) 325 MG tablet Take 2 tablets (650 mg total) by mouth every 6 (six) hours as needed for mild pain. 09/23/16   Ardelle Balls, PA-C  albuterol (VENTOLIN HFA) 108 (90 Base) MCG/ACT inhaler Inhale 2 puffs into the lungs every 4 (four) hours as needed. 06/25/23   Particia Nearing, PA-C  allopurinol (ZYLOPRIM) 100 MG tablet Take 100 mg by mouth daily.  05/03/19   [provider]  amLODipine (NORVASC) 5 MG tablet Take 1 tablet (5 mg total) by mouth daily. 08/03/22   Filbert Schilder, NP   atorvastatin (LIPITOR) 80 MG tablet Take 80 mg by mouth daily.    [provider]  benzonatate (TESSALON PERLES) 100 MG capsule Take 1 capsule (100 mg total) by mouth 3 (three) times daily as needed for cough. 08/08/22   Leath-Warren, Sadie Haber, NP  carvedilol (COREG) 25 MG tablet Take 25 mg by mouth 2 (two) times daily with a meal.    [provider]  carvedilol (COREG) 6.25 MG tablet Take 1 tablet (6.25 mg total) by mouth 2 (two) times daily. Patient not taking: Reported on 08/03/2022 05/29/21 04/20/22  Arrien, York Ram, MD  cetirizine (ZYRTEC) 10 MG tablet Take 1 tablet (10 mg total) by mouth daily. 08/08/22   Leath-Warren, Sadie Haber, NP  clopidogrel (PLAVIX) 75 MG tablet Take 75 mg by mouth daily.    [provider]  finasteride (PROSCAR) 5 MG tablet Take 5 mg by mouth daily.    [provider]  fluticasone (FLONASE) 50 MCG/ACT nasal spray Place 2 sprays into both nostrils daily. 08/08/22   Leath-Warren, Sadie Haber, NP  fluticasone (FLONASE) 50 MCG/ACT nasal spray Place 1 spray into both nostrils 2 (two) times daily. 06/25/23   Particia Nearing, PA-C  folic acid (FOLVITE) 1 MG tablet Take 1 mg by mouth daily.    [provider]  furosemide (LASIX) 80 MG tablet Take 1 tablet (80  mg total) by mouth every Tuesday, Thursday, Saturday, and Sunday. 09/05/20   Catarina Hartshorn, MD  levothyroxine (SYNTHROID, LEVOTHROID) 50 MCG tablet Take 50 mcg by mouth daily before breakfast.    [provider]  lidocaine-prilocaine (EMLA) cream Apply 1 application. topically every Monday, Wednesday, and Friday with hemodialysis.    [provider]  nitroGLYCERIN (NITROSTAT) 0.4 MG SL tablet Place 0.4 mg under the tongue every 5 (five) minutes as needed for chest pain (max 3 doses).    [provider]  Omega-3 Fatty Acids (RA FISH OIL) 1000 MG CAPS Take 2,000 mg by mouth 2 (two) times daily.    [provider]  omeprazole (PRILOSEC) 40  MG capsule Take 40 mg by mouth daily. 02/14/20   [provider]  potassium chloride SA (KLOR-CON M) 20 MEQ tablet Take 1 tablet (20 mEq total) by mouth 2 (two) times daily. 12/01/22   Particia Nearing, PA-C  sacubitril-valsartan (ENTRESTO) 24-26 MG Take 1 tablet by mouth 2 (two) times daily.    [provider]      Allergies    Cardizem cd [diltiazem hcl er beads]    Review of Systems   Review of Systems  Respiratory:  Positive for shortness of breath.   All other systems reviewed and are negative.   Physical Exam Updated Vital Signs BP (!) 161/78 (BP Location: Right Arm)   Pulse 70   Temp (!) 97.5 F (36.4 C)   Resp 20   Ht 1.753 m (5\' 9" )   Wt 83.9 kg   SpO2 98%   BMI 27.32 kg/m  Physical Exam Vitals and nursing note reviewed.  Constitutional:      General: He is not in acute distress.    Appearance: He is well-developed.  HENT:     Head: Normocephalic and atraumatic.     Nose: Nose normal.     Mouth/Throat:     Pharynx: No oropharyngeal exudate.  Eyes:     General: No scleral icterus.       Right eye: No discharge.        Left eye: No discharge.     Conjunctiva/sclera: Conjunctivae normal.     Pupils: Pupils are equal, round, and reactive to light.  Neck:     Thyroid: No thyromegaly.     Vascular: No JVD.  Cardiovascular:     Rate and Rhythm: Normal rate and regular rhythm.     Heart sounds: Normal heart sounds. No murmur heard.    No friction rub. No gallop.     Comments: No JVD Pulmonary:     Effort: No respiratory distress.     Breath sounds: Normal breath sounds. No wheezing or rales.     Comments: The patient is mildly tachypneic, lung sounds are overall very clear, no rales or wheezing Abdominal:     General: Bowel sounds are normal. There is no distension.     Palpations: Abdomen is soft. There is no mass.     Tenderness: There is no abdominal tenderness.  Musculoskeletal:        General: No tenderness. Normal range of  motion.     Cervical back: Normal range of motion and neck supple.     Right lower leg: No tenderness. Edema present.     Left lower leg: No tenderness. Edema present.  Lymphadenopathy:     Cervical: No cervical adenopathy.  Skin:    General: Skin is warm and dry.     Findings: No erythema  or rash.  Neurological:     General: No focal deficit present.     Mental Status: He is alert.     Coordination: Coordination normal.  Psychiatric:        Behavior: Behavior normal.     ED Results / Procedures / Treatments   Labs (all labs ordered are listed, but only abnormal results are displayed) Labs Reviewed  CBC WITH DIFFERENTIAL/PLATELET - Abnormal; Notable for the following components:      Result Value   WBC 3.8 (*)    RBC 3.79 (*)    Hemoglobin 10.6 (*)    HCT 33.8 (*)    RDW 18.9 (*)    Platelets 121 (*)    Lymphs Abs 0.6 (*)    All other components within normal limits  COMPREHENSIVE METABOLIC PANEL WITH GFR - Abnormal; Notable for the following components:   Chloride 97 (*)    BUN 33 (*)    Creatinine, Ser 5.65 (*)    Albumin 3.3 (*)    GFR, Estimated 10 (*)    All other components within normal limits    EKG EKG Interpretation Date/Time:  Sunday August 20 2023 12:01:07 EDT Ventricular Rate:  66 PR Interval:    QRS Duration:  154 QT Interval:  458 QTC Calculation: 480 R Axis:   -26  Text Interpretation: Normal sinus rhythm Left ventricular hypertrophy with QRS widening and repolarization abnormality ( R in aVL , Sokolow-Lyon , Cornell product , Romhilt-Estes ) Abnormal ECG When compared with ECG of 11-Aug-2021 06:41, T wave amplitude has decreased in Anterior leads Confirmed by Eber Hong ([id number]) on 08/20/2023 12:12:25 PM  Radiology DG Chest 2 View Result Date: 08/20/2023 CLINICAL DATA:  Shortness of breath EXAM: CHEST - 2 VIEW COMPARISON:  07/07/2022 FINDINGS: Prior CABG and aortic valve repair. Mild cardiomegaly, vascular congestion. Lingular density likely  reflects scarring. No acute confluent opacities, effusions or edema. No acute bony abnormality. IMPRESSION: Cardiomegaly, vascular congestion. No active disease. Electronically Signed   By: Charlett Nose M.D.   On: 08/20/2023 12:18    Procedures Procedures    Medications Ordered in ED Medications - No data to display  ED Course/ Medical Decision Making/ A&P                                 Medical Decision Making Amount and/or Complexity of Data Reviewed Labs: ordered. Radiology: ordered. ECG/medicine tests: ordered.    This patient presents to the ED for concern of shortness of breath differential diagnosis includes pulmonary edema, pneumonia, acute coronary syndrome, hyperkalemia    Additional history obtained:  Additional history obtained from medical record External records from outside source obtained and reviewed including echocardiogram last performed in March 2024, ejection fraction of 45 to 50% with grade 2 diastolic dysfunction   Lab Tests:  I Ordered, and personally interpreted labs.  The pertinent results include: Potassium is normal   Imaging Studies ordered:  I ordered imaging studies including chest x-ray I independently visualized and interpreted imaging which showed mild cardiomegaly and vascular congestion but no overt pulmonary edema I agree with the radiologist interpretation   Medicines ordered and prescription drug management:  \Patient kept on a cardiac monitor, no tachycardia or arrhythmia or hypoxia I have reviewed the patients home medicines and have made adjustments as needed   Problem List / ED Course:  Patient ambulated around the nursing station twice, had to take a short  rest 1 time but never dropped his oxygen below 92%, at rest 96-98.  This is on room air. Appears stable for discharge and follow-up with dialysis tomorrow, no signs of hyperkalemia arrhythmia hypoxia or overt pulmonary edema   Social Determinants of  Health:   End-stage renal disease          Final Clinical Impression(s) / ED Diagnoses Final diagnoses:  Shortness of breath    Rx / DC Orders ED Discharge Orders     None         Eber Hong, MD 08/20/23 1404

## 2024-01-30 ENCOUNTER — Other Ambulatory Visit: Payer: Self-pay | Admitting: Radiology

## 2024-01-30 ENCOUNTER — Other Ambulatory Visit (HOSPITAL_COMMUNITY): Payer: Self-pay | Admitting: Hematology and Oncology

## 2024-01-30 DIAGNOSIS — D696 Thrombocytopenia, unspecified: Secondary | ICD-10-CM

## 2024-02-01 ENCOUNTER — Ambulatory Visit (HOSPITAL_COMMUNITY)

## 2024-02-01 ENCOUNTER — Encounter (HOSPITAL_COMMUNITY): Payer: Self-pay

## 2024-03-05 ENCOUNTER — Encounter (HOSPITAL_COMMUNITY): Payer: Self-pay

## 2024-03-07 ENCOUNTER — Encounter (HOSPITAL_COMMUNITY): Admission: RE | Disposition: A | Payer: Self-pay | Source: Home / Self Care | Attending: Vascular Surgery

## 2024-03-07 ENCOUNTER — Other Ambulatory Visit: Payer: Self-pay

## 2024-03-07 ENCOUNTER — Encounter (HOSPITAL_COMMUNITY): Payer: Self-pay | Admitting: Vascular Surgery

## 2024-03-07 ENCOUNTER — Ambulatory Visit (HOSPITAL_COMMUNITY)
Admission: RE | Admit: 2024-03-07 | Discharge: 2024-03-07 | Disposition: A | Discharge status: Home / Self Care | Attending: Vascular Surgery | Admitting: Vascular Surgery

## 2024-03-07 DIAGNOSIS — Z992 Dependence on renal dialysis: Secondary | ICD-10-CM | POA: Insufficient documentation

## 2024-03-07 DIAGNOSIS — D696 Thrombocytopenia, unspecified: Secondary | ICD-10-CM

## 2024-03-07 DIAGNOSIS — T82858A Stenosis of vascular prosthetic devices, implants and grafts, initial encounter: Secondary | ICD-10-CM | POA: Insufficient documentation

## 2024-03-07 DIAGNOSIS — I120 Hypertensive chronic kidney disease with stage 5 chronic kidney disease or end stage renal disease: Secondary | ICD-10-CM | POA: Insufficient documentation

## 2024-03-07 DIAGNOSIS — N186 End stage renal disease: Secondary | ICD-10-CM | POA: Diagnosis not present

## 2024-03-07 DIAGNOSIS — Y832 Surgical operation with anastomosis, bypass or graft as the cause of abnormal reaction of the patient, or of later complication, without mention of misadventure at the time of the procedure: Secondary | ICD-10-CM | POA: Insufficient documentation

## 2024-03-07 DIAGNOSIS — T82898A Other specified complication of vascular prosthetic devices, implants and grafts, initial encounter: Secondary | ICD-10-CM | POA: Diagnosis not present

## 2024-03-07 DIAGNOSIS — Z79899 Other long term (current) drug therapy: Secondary | ICD-10-CM | POA: Insufficient documentation

## 2024-03-07 DIAGNOSIS — Z7901 Long term (current) use of anticoagulants: Secondary | ICD-10-CM | POA: Insufficient documentation

## 2024-03-07 DIAGNOSIS — Z87891 Personal history of nicotine dependence: Secondary | ICD-10-CM | POA: Diagnosis not present

## 2024-03-07 HISTORY — PX: A/V FISTULAGRAM: CATH118298

## 2024-03-07 HISTORY — PX: VENOUS ANGIOPLASTY: CATH118376

## 2024-03-07 HISTORY — PX: VENOUS STENT: CATH118377

## 2024-03-07 SURGERY — A/V FISTULAGRAM
Anesthesia: LOCAL | Site: Arm Upper | Laterality: Right

## 2024-03-07 MED ORDER — IODIXANOL 320 MG/ML IV SOLN
INTRAVENOUS | Status: DC | PRN
  Administered 2024-03-07: 60 mL via INTRAVENOUS

## 2024-03-07 MED ORDER — LIDOCAINE HCL (PF) 1 % IJ SOLN
INTRAMUSCULAR | Status: DC | PRN
  Administered 2024-03-07: 9 mL

## 2024-03-07 MED ORDER — SODIUM CHLORIDE 0.9 % IV SOLN: INTRAVENOUS | Status: DC

## 2024-03-07 MED ORDER — LIDOCAINE HCL (PF) 1 % IJ SOLN
INTRAMUSCULAR | Status: AC
  Filled 2024-03-07: qty 30

## 2024-03-07 MED ORDER — HEPARIN (PORCINE) IN NACL 1000-0.9 UT/500ML-% IV SOLN
INTRAVENOUS | Status: DC | PRN
  Administered 2024-03-07: 500 mL

## 2024-03-07 SURGICAL SUPPLY — 13 items
BALLOON MUSTANG 8.0X40 75 (BALLOONS) IMPLANT
BALLOON MUSTANG 9X80X75 (BALLOONS) IMPLANT
CATH SLIP KMP 65CM 5FR (CATHETERS) IMPLANT
DEVICE INFLATION ENCORE 26 (MISCELLANEOUS) IMPLANT
DEVICE TORQUE SEADRAGON GRN (MISCELLANEOUS) IMPLANT
GUIDEWIRE ANGLED .035 180CM (WIRE) IMPLANT
KIT MICROPUNCTURE NIT STIFF (SHEATH) IMPLANT
SHEATH PINNACLE R/O II 7F 4CM (SHEATH) IMPLANT
SHEATH PROBE COVER 6X72 (BAG) IMPLANT
STENT VIABAHN 10X5X120 (Permanent Stent) IMPLANT
TRAY PV CATH (CUSTOM PROCEDURE TRAY) ×2 IMPLANT
TUBING CIL FLEX 10 FLL-RA (TUBING) IMPLANT
WIRE BENTSON .035X145CM (WIRE) IMPLANT

## 2024-03-07 NOTE — H&P (Signed)
 H+P  History of Present Illness: This is a 78 y.o. male history of end-stage renal disease on dialysis via right upper arm AV fistula on Mondays, Wednesdays and Fridays.  He has had pulsatility in the fistula for many years states that recently he has had bleeding issues.  He was also recently transition from clopidogrel  to Eliquis.  He is having increased bleeding times after they remove the needles but also had 1 episode of spontaneous bleeding at home.  He denies previous percutaneous intervention.  Past Medical History:  Diagnosis Date   Acute myocardial infarction of other inferior wall, initial episode of care    Anemia    hx of bld transfusion   Anxiety    Arthritis    Cancer (HCC)    bladder 2013 removed, has cystoscopy 10/2016, has returned x 2   Chronic kidney disease    dialysis - M-W-F at [facility] in [place]   COPD (chronic obstructive pulmonary disease) (HCC)    patient is not aware of this dx   Coronary artery disease    Artery bypass graft Jan 2008   Dialysis patient    GERD (gastroesophageal reflux disease)    Hearing loss    wears hearing aids   History of blood transfusion 2017   History of hiatal hernia    Hyperlipidemia    Hypertension    Hypothyroidism    PONV (postoperative nausea and vomiting)    only with TURP surgery, no other problems with the other surgeries   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 Verlin and Dr. Lucas   Severe aortic stenosis    Sleep apnea    uses CPAP nightly   Stroke (HCC)    Tobacco user    quit 09/2016    Past Surgical History:  Procedure Laterality Date   APPENDECTOMY     AV FISTULA PLACEMENT Right 02/11/2020   Procedure: RIGHT ARM BRACHIOCEPHALIC ARTERIOVENOUS (AV) FISTULA;  Surgeon: Eliza Lonni RAMAN, MD;  Location: [facility] OR;  Service: Vascular;  Laterality: Right;   CARDIAC CATHETERIZATION  05/28/2021   COLONOSCOPY WITH PROPOFOL  N/A 09/02/2020   Procedure:  COLONOSCOPY WITH PROPOFOL ;  Surgeon: Golda Claudis PENNER, MD;  Location: [facility] ENDO SUITE;  Service: Endoscopy;  Laterality: N/A;   CORONARY ARTERY BYPASS GRAFT  06/14/2006   5 blockages   CORONARY ARTERY BYPASS GRAFT N/A 09/19/2016   Procedure: REDO CORONARY ARTERY BYPASS GRAFTING (CABG) x two, using right internal mammary artery and left radial artery;  Surgeon: Lucas Dorise POUR, MD;  Location: [facility] OR;  Service: Open Heart Surgery;  Laterality: N/A;   FOOT SURGERY Right    HEMOSTASIS CLIP PLACEMENT  09/02/2020   Procedure: HEMOSTASIS CLIP PLACEMENT;  Surgeon: Golda Claudis PENNER, MD;  Location: [facility] SU[facility];  Service: Endoscopy;;   HERNIA REPAIR     x 3 surgeries   HOT HEMOSTASIS  09/02/2020   Procedure: HOT HEMOSTASIS (ARGON PLASMA COAGULATION/BICAP);  Surgeon: Golda Claudis PENNER, MD;  Location: [facility];  Service: Endoscopy;;   LEFT HEART CATH AND CORS/GRAFTS ANGIOGRAPHY N/A 09/14/2016   Procedure: Left Heart Cath and Cors/Grafts Angiography;  Surgeon: Debby DELENA Sor, MD;  Location: [facility] INVASIVE CV LAB;  Service: Cardiovascular;  Laterality: N/A;   LEFT HEART CATHETERIZATION WITH CORONARY/GRAFT ANGIOGRAM N/A 01/31/2014   Procedure: LEFT HEART CATHETERIZATION WITH EL BILE;  Surgeon: Jerel Balding, MD;  Location: [facility] CATH LAB;  Service: Cardiovascular;  Laterality: N/A;   PERCUTANEOUS CORONARY  STENT INTERVENTION (PCI-S)  01/31/2014   Procedure: PERCUTANEOUS CORONARY STENT INTERVENTION (PCI-S);  Surgeon: Jerel Balding, MD;  Location: [facility] CATH LAB;  Service: Cardiovascular;;   POLYPECTOMY  09/02/2020   Procedure: POLYPECTOMY;  Surgeon: Golda Claudis PENNER, MD;  Location: [facility];  Service: Endoscopy;;   RADIAL ARTERY HARVEST Left 09/19/2016   Procedure: RADIAL ARTERY HARVEST;  Surgeon: Lucas Dorise POUR, MD;  Location: [facility] OR;  Service: Open Heart Surgery;  Laterality: Left;   RIGHT/LEFT HEART CATH AND CORONARY/GRAFT ANGIOGRAPHY N/A 05/28/2021   Procedure: RIGHT/LEFT HEART CATH AND  CORONARY/GRAFT ANGIOGRAPHY;  Surgeon: Verlin Lonni BIRCH, MD;  Location: [facility] INVASIVE CV LAB;  Service: Cardiovascular;  Laterality: N/A;   TEE WITHOUT CARDIOVERSION N/A 09/19/2016   Procedure: TRANSESOPHAGEAL ECHOCARDIOGRAM (TEE);  Surgeon: Lucas Dorise POUR, MD;  Location: [facility] OR;  Service: Open Heart Surgery;  Laterality: N/A;   TEE WITHOUT CARDIOVERSION N/A 08/10/2021   Procedure: TRANSESOPHAGEAL ECHOCARDIOGRAM (TEE);  Surgeon: Verlin Lonni BIRCH, MD;  Location: [facility] OR;  Service: Open Heart Surgery;  Laterality: N/A;   TRANSCATHETER AORTIC VALVE REPLACEMENT, TRANSAPICAL N/A 08/10/2021   Procedure: Transcatheter Aortic Valve Replacement-Transapical;  Surgeon: Verlin Lonni BIRCH, MD;  Location: [facility] OR;  Service: Open Heart Surgery;  Laterality: N/A;   TRANSURETHRAL RESECTION BLADDER TUMOR and bilateral RPG's  10/07/2020   [facility]    Allergies  Allergen Reactions   Cardizem Cd [Diltiazem Hcl Er Beads] Palpitations    Makes heart skip    Prior to Admission medications   Medication Sig Start Date End Date Taking? Authorizing Provider  acetaminophen  (TYLENOL ) 325 MG tablet Take 2 tablets (650 mg total) by mouth every 6 (six) hours as needed for mild pain. 09/23/16   Dwan Kyla HERO, PA-C  albuterol  (VENTOLIN  HFA) 108 (90 Base) MCG/ACT inhaler Inhale 2 puffs into the lungs every 4 (four) hours as needed. 06/25/23   Stuart Vernell Norris, PA-C  allopurinol  (ZYLOPRIM ) 100 MG tablet Take 100 mg by mouth daily.  05/03/19   [provider]  amLODipine  (NORVASC ) 5 MG tablet Take 1 tablet (5 mg total) by mouth daily. 08/03/22   McDaniel, Jill D, NP  atorvastatin  (LIPITOR ) 80 MG tablet Take 80 mg by mouth daily.    [provider]  benzonatate  (TESSALON  PERLES) 100 MG capsule Take 1 capsule (100 mg total) by mouth 3 (three) times daily as needed for cough. 08/08/22   Leath-Warren, Etta PARAS, NP  carvedilol  (COREG ) 25 MG tablet Take 25 mg by mouth 2 (two) times daily  with a meal.    [provider]  carvedilol  (COREG ) 6.25 MG tablet Take 1 tablet (6.25 mg total) by mouth 2 (two) times daily. Patient not taking: Reported on 08/03/2022 05/29/21 04/20/22  Arrien, Elidia Sieving, MD  cetirizine  (ZYRTEC ) 10 MG tablet Take 1 tablet (10 mg total) by mouth daily. 08/08/22   Leath-Warren, Etta PARAS, NP  clopidogrel  (PLAVIX ) 75 MG tablet Take 75 mg by mouth daily.    [provider]  finasteride  (PROSCAR ) 5 MG tablet Take 5 mg by mouth daily.    [provider]  fluticasone  (FLONASE ) 50 MCG/ACT nasal spray Place 2 sprays into both nostrils daily. 08/08/22   Leath-Warren, Etta PARAS, NP  fluticasone  (FLONASE ) 50 MCG/ACT nasal spray Place 1 spray into both nostrils 2 (two) times daily. 06/25/23   Stuart Vernell Norris, PA-C  folic acid  (FOLVITE ) 1 MG tablet Take 1 mg by mouth daily.    [provider]  furosemide  (LASIX ) 80 MG tablet  Take 1 tablet (80 mg total) by mouth every Tuesday, Thursday, Saturday, and Sunday. 09/05/20   Evonnie Lenis, MD  levothyroxine  (SYNTHROID , LEVOTHROID) 50 MCG tablet Take 50 mcg by mouth daily before breakfast.    [provider]  lidocaine -prilocaine  (EMLA ) cream Apply 1 application. topically every Monday, Wednesday, and Friday with hemodialysis.    [provider]  nitroGLYCERIN  (NITROSTAT ) 0.4 MG SL tablet Place 0.4 mg under the tongue every 5 (five) minutes as needed for chest pain (max 3 doses).    [provider]  Omega-3 Fatty Acids (RA FISH OIL ) 1000 MG CAPS Take 2,000 mg by mouth 2 (two) times daily.    [provider]  omeprazole (PRILOSEC) 40 MG capsule Take 40 mg by mouth daily. 02/14/20   [provider]  potassium chloride  SA (KLOR-CON  M) 20 MEQ tablet Take 1 tablet (20 mEq total) by mouth 2 (two) times daily. 12/01/22   Stuart Vernell Norris, PA-C  sacubitril-valsartan (ENTRESTO) 24-26 MG Take 1 tablet by mouth 2 (two) times daily.    [provider]     Social History   Socioeconomic History   Marital status: Married    Spouse name: Not on file   Number of children: 2   Years of education: Not on file   Highest education level: Not on file  Occupational History   Occupation: Retired-Worked in Set designer  Tobacco Use   Smoking status: Former    Current packs/day: 0.00    Average packs/day: 1 pack/day for 57.6 years (57.6 ttl pk-yrs)    Types: Cigarettes    Start date: 02/20/1959    Quit date: 09/13/2016    Years since quitting: 7.4   Smokeless tobacco: Never  Vaping Use   Vaping status: Never Used  Substance and Sexual Activity   Alcohol use: No    Alcohol/week: 0.0 standard drinks of alcohol    Comment: No, not really   Drug use: No   Sexual activity: Not Currently  Other Topics Concern   Not on file  Social History Narrative   Not on file   Social Drivers of Health   Financial Resource Strain: Not on file  Food Insecurity: Not on file  Transportation Needs: Not on file  Physical Activity: Not on file  Stress: Not on file  Social Connections: Unknown (09/27/2021)   [facility]   Social Network    Social Network: Not on file  Intimate Partner Violence: Unknown (08/19/2021)   Received from [facility]   HITS    Physically Hurt: Not on file    Insult or Talk Down To: Not on file    Threaten Physical Harm: Not on file    Scream or Curse: Not on file     Family History  Problem Relation Age of Onset   Heart attack Mother    CVA Mother    CVA Father    Heart attack Brother    Colon cancer Neg Hx    Colon polyps Neg Hx     ROS: As above   Physical Examination  Vitals:   03/07/24 0759  BP: (!) 147/65  Pulse: 62  Resp: 12  Temp: (!) 97.5 F (36.4 C)  SpO2: 95%   There is no height or weight on file to calculate BMI.  Awake alert and oriented Nonlabored aspirations Right upper arm AV fistula appears patent initially and then through the mid segment has mild  pseudoaneurysmal degeneration with pulsatility.  Towards the clavicle there  is a palpable thrill without pulsatility  CBC    Component Value Date/Time   WBC 3.8 (L) 08/20/2023 1224   RBC 3.79 (L) 08/20/2023 1224   HGB 10.6 (L) 08/20/2023 1224   HGB 10.7 (L) 04/21/2022 1520   HCT 33.8 (L) 08/20/2023 1224   HCT 30.8 (L) 04/21/2022 1520   PLT 121 (L) 08/20/2023 1224   PLT 157 04/21/2022 1520   MCV 89.2 08/20/2023 1224   MCV 89 04/21/2022 1520   MCH 28.0 08/20/2023 1224   MCHC 31.4 08/20/2023 1224   RDW 18.9 (H) 08/20/2023 1224   RDW 15.0 04/21/2022 1520   LYMPHSABS 0.6 (L) 08/20/2023 1224   LYMPHSABS 1.1 04/21/2022 1520   MONOABS 0.3 08/20/2023 1224   EOSABS 0.0 08/20/2023 1224   EOSABS 0.1 04/21/2022 1520   BASOSABS 0.0 08/20/2023 1224   BASOSABS 0.0 04/21/2022 1520    BMET    Component Value Date/Time   NA 135 08/20/2023 1224   K 4.0 08/20/2023 1224   CL 97 (L) 08/20/2023 1224   CO2 24 08/20/2023 1224   GLUCOSE 95 08/20/2023 1224   BUN 33 (H) 08/20/2023 1224   CREATININE 5.65 (H) 08/20/2023 1224   CALCIUM  9.0 08/20/2023 1224   CALCIUM  8.5 (L) 04/24/2020 1112   GFRNONAA 10 (L) 08/20/2023 1224   GFRAA 19 (L) 02/11/2020 0849    COAGS: Lab Results  Component Value Date   INR 1.2 08/06/2021   INR 1.1 05/27/2021   INR 1.2 08/30/2020      ASSESSMENT/PLAN: This is a 78 y.o. male with end-stage renal disease on dialysis via right upper arm cephalic vein fistula.  He appears to have stenosis in the mid upper arm by physical exam.  We discussed his options being continued watchful waiting versus proceeding with fistulogram and possible intervention.  He demonstrates good understanding and agrees to proceed and consent was signed.  Elna Radovich C. Sheree, MD [facility] Office: [phone] Pager: [phone]

## 2024-03-07 NOTE — Op Note (Signed)
    Patient name: [name] MRN: [id number] DOB: 01-18-1946 Sex: male  03/07/2024 Pre-operative Diagnosis: End-stage renal disease, complication of renal dialysis access with increased bleeding times Post-operative diagnosis:  Same Surgeon:  Penne BROCKS. Sheree, MD Procedure Performed: 1.  Percutaneous ultrasound-guided cannulation right upper arm AV fistula 2.  Right upper extremity shuntogram 3.  Stent right cephalic arch with 10 x 50 mm Viabahn 4.  Balloon angioplasty with 9 mm balloon intra fistula stenosis mid right upper arm   Indications: 78 year old male with end-stage renal disease currently dialyzing via right upper extremity AV fistula.  He has had increased bleeding times after dialysis and is now indicated for fistulogram with possible intervention.  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  This was ballooned with full effacement of a 9 mm balloon to less than 20% residual stenosis.  The cephalic arch was stenosed to approximately 85%.  Essentially the veins were patent.  The cephalic arch was initially ballooned with 9 mm balloon followed by attempts at balloon angioplasty with 8 mm balloon which resulted in minor perforation.  This was stented to 0% residual stenosis with a 10 x 50 mm Viabahn.  At completion there was improved thrill throughout the upper arm fistula without evidence of continued stenosis.  Retrograde fistulogram demonstrated patency of the arterial venous anastomosis.  Fistula is okay for immediate use, he remains amenable to future percutaneous intervention.   Procedure:  The patient was identified in the holding area and taken to the heart and vascular procedure room.  The patient was then placed supine on the table and prepped and draped in the usual sterile fashion.  A time out was called.  Ultrasound was used to evaluate the right arm AV fistula near the antecubitum.  The skin and soft tissue was anesthetized 1%  lidocaine  and the fascia was cannulated with a micropuncture needle followed by wire and a sheath using direct ultrasound visualization and ultrasound images saved the permanent record.  We performed right upper extremity shuntogram imaging with the above findings we then placed a Bentson wire followed by a 7 [place] sheath.  Ultimately we traversed the stenosed areas using a Glidewire and KMP catheter.  We confirmed intraluminal access.  We then began by performing balloon venoplasty first with a 9 mm balloon to the cephalic arch and then dropped down to an 8 mm balloon given the tight stenosis.  The millimeter balloon was ultimately able to rupture the stenosis but there was a microperforation and this was stented with a 10 x 50 mm Viabahn postdilated with 8 mm balloon and there was no residual stenosis or perforation and brisk flow centrally.  Attention was then turned to the mid right upper arm.  This was initially ballooned with 8 followed by 9 mm balloons to full effacement.  There was less than 20% residual stenosis at completion and much improved thrill in the fistula.  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  The wire was removed and the sheath site was suture-ligated with 4-0 Monocryl.  He tolerated the procedure without any complication.  Contrast: 60cc   Sara Selvidge C. Sheree, MD [facility] Office: [phone] Pager: [phone]

## 2024-03-14 ENCOUNTER — Encounter (HOSPITAL_COMMUNITY): Payer: Self-pay | Admitting: Vascular Surgery

## 2024-05-22 ENCOUNTER — Other Ambulatory Visit (HOSPITAL_COMMUNITY): Payer: Self-pay | Admitting: Nurse Practitioner

## 2024-05-22 DIAGNOSIS — N186 End stage renal disease: Secondary | ICD-10-CM

## 2024-05-22 DIAGNOSIS — D649 Anemia, unspecified: Secondary | ICD-10-CM

## 2024-05-23 ENCOUNTER — Other Ambulatory Visit (HOSPITAL_COMMUNITY): Payer: Self-pay | Admitting: Nurse Practitioner

## 2024-05-23 DIAGNOSIS — N186 End stage renal disease: Secondary | ICD-10-CM

## 2024-05-23 DIAGNOSIS — D649 Anemia, unspecified: Secondary | ICD-10-CM

## 2024-05-27 ENCOUNTER — Emergency Department (HOSPITAL_COMMUNITY)
Admission: EM | Admit: 2024-05-27 | Discharge: 2024-05-27 | Disposition: A | Discharge status: Home / Self Care | Attending: Emergency Medicine | Admitting: Emergency Medicine

## 2024-05-27 ENCOUNTER — Encounter (HOSPITAL_COMMUNITY): Payer: Self-pay

## 2024-05-27 DIAGNOSIS — I4891 Unspecified atrial fibrillation: Secondary | ICD-10-CM | POA: Diagnosis not present

## 2024-05-27 DIAGNOSIS — K625 Hemorrhage of anus and rectum: Secondary | ICD-10-CM | POA: Insufficient documentation

## 2024-05-27 DIAGNOSIS — Z7901 Long term (current) use of anticoagulants: Secondary | ICD-10-CM | POA: Diagnosis not present

## 2024-05-27 DIAGNOSIS — Z7902 Long term (current) use of antithrombotics/antiplatelets: Secondary | ICD-10-CM | POA: Insufficient documentation

## 2024-05-27 DIAGNOSIS — R319 Hematuria, unspecified: Secondary | ICD-10-CM | POA: Insufficient documentation

## 2024-05-27 DIAGNOSIS — D72819 Decreased white blood cell count, unspecified: Secondary | ICD-10-CM | POA: Diagnosis not present

## 2024-05-27 DIAGNOSIS — D696 Thrombocytopenia, unspecified: Secondary | ICD-10-CM | POA: Insufficient documentation

## 2024-05-27 LAB — CBC
HCT: 36.3 % — ABNORMAL LOW (ref 39.0–52.0)
Hemoglobin: 11.8 g/dL — ABNORMAL LOW (ref 13.0–17.0)
MCH: 30.5 pg (ref 26.0–34.0)
MCHC: 32.5 g/dL (ref 30.0–36.0)
MCV: 93.8 fL (ref 80.0–100.0)
Platelets: 53 K/uL — ABNORMAL LOW (ref 150–400)
RBC: 3.87 MIL/uL — ABNORMAL LOW (ref 4.22–5.81)
RDW: 16.8 % — ABNORMAL HIGH (ref 11.5–15.5)
WBC: 2.5 K/uL — ABNORMAL LOW (ref 4.0–10.5)
nRBC: 0 % (ref 0.0–0.2)

## 2024-05-27 LAB — COMPREHENSIVE METABOLIC PANEL WITH GFR
ALT: 7 U/L (ref 0–44)
AST: 23 U/L (ref 15–41)
Albumin: 3.8 g/dL (ref 3.5–5.0)
Alkaline Phosphatase: 90 U/L (ref 38–126)
Anion gap: 13 (ref 5–15)
BUN: 13 mg/dL (ref 8–23)
CO2: 29 mmol/L (ref 22–32)
Calcium: 8.6 mg/dL — ABNORMAL LOW (ref 8.9–10.3)
Chloride: 96 mmol/L — ABNORMAL LOW (ref 98–111)
Creatinine, Ser: 3.4 mg/dL — ABNORMAL HIGH (ref 0.61–1.24)
GFR, Estimated: 18 mL/min — ABNORMAL LOW
Glucose, Bld: 89 mg/dL (ref 70–99)
Potassium: 3.1 mmol/L — ABNORMAL LOW (ref 3.5–5.1)
Sodium: 139 mmol/L (ref 135–145)
Total Bilirubin: 1.1 mg/dL (ref 0.0–1.2)
Total Protein: 6.7 g/dL (ref 6.5–8.1)

## 2024-05-27 LAB — TYPE AND SCREEN
ABO/RH(D): A POS
Antibody Screen: NEGATIVE

## 2024-05-27 LAB — URINALYSIS, ROUTINE W REFLEX MICROSCOPIC
Bacteria, UA: NONE SEEN
Bilirubin Urine: NEGATIVE
Glucose, UA: NEGATIVE mg/dL
Ketones, ur: NEGATIVE mg/dL
Nitrite: NEGATIVE
Protein, ur: 100 mg/dL — AB
RBC / HPF: >50 RBC/hpf (ref 0–5)
Specific Gravity, Urine: 1.017 (ref 1.005–1.030)
WBC, UA: >50 WBC/hpf (ref 0–5)
pH: 7 (ref 5.0–8.0)

## 2024-05-27 NOTE — ED Notes (Signed)
 Per MD Cleotilde the pts POC Occult blood was negative at this time.

## 2024-05-27 NOTE — ED Triage Notes (Addendum)
 Pt c/o hematuria x3 weeks and bright red rectal bleeding starting last night.  Denies pain.  Pt reports he was recently changed from Plavix  to Eliquis x6 weeks ago.   Pt reports the hematuria has almost cleared up.

## 2024-05-27 NOTE — ED Provider Notes (Signed)
 " [place] AT [facility] Provider Note   CSN: [id number] Arrival date & time: 05/27/24  1608     Patient presents with: Hematuria and Rectal Bleeding   [name] is a 79 y.o. male.    Hematuria  Rectal Bleeding  This patient is a 79 year old male on Eliquis for atrial fibrillation presenting with a complaint of hematuria for the last few weeks as well as a small amount of bright red blood in his brown stool that he noted last night.  He has absolutely no abdominal pain shortness of breath chest pain headaches numbness weakness swelling of the legs or any other symptoms.  The patient reports that he had been on Plavix  in the past but had been switched over to Eliquis and was told by his cardiologist that this would help reduce the risk of stroke.  He asked them if he needed to come off of the Eliquis and they said no just go to the emergency department if you have bleeding.  He has not had any further bleeding since the small amount in his stool last night.    Prior to Admission medications  Medication Sig Start Date End Date Taking? Authorizing Provider  acetaminophen  (TYLENOL ) 325 MG tablet Take 2 tablets (650 mg total) by mouth every 6 (six) hours as needed for mild pain. 09/23/16   Dwan Kyla HERO, PA-C  albuterol  (VENTOLIN  HFA) 108 (90 Base) MCG/ACT inhaler Inhale 2 puffs into the lungs every 4 (four) hours as needed. 06/25/23   Stuart Vernell Norris, PA-C  allopurinol  (ZYLOPRIM ) 100 MG tablet Take 100 mg by mouth daily.  05/03/19   [provider]  amLODipine  (NORVASC ) 5 MG tablet Take 1 tablet (5 mg total) by mouth daily. 08/03/22   McDaniel, Jill D, NP  atorvastatin  (LIPITOR ) 80 MG tablet Take 80 mg by mouth daily.    [provider]  benzonatate  (TESSALON  PERLES) 100 MG capsule Take 1 capsule (100 mg total) by mouth 3 (three) times daily as needed for cough. 08/08/22   Leath-Warren, Etta PARAS, NP  carvedilol  (COREG ) 25 MG  tablet Take 25 mg by mouth 2 (two) times daily with a meal.    [provider]  carvedilol  (COREG ) 6.25 MG tablet Take 1 tablet (6.25 mg total) by mouth 2 (two) times daily. Patient not taking: Reported on 08/03/2022 05/29/21 04/20/22  Arrien, Elidia Sieving, MD  cetirizine  (ZYRTEC ) 10 MG tablet Take 1 tablet (10 mg total) by mouth daily. 08/08/22   Leath-Warren, Etta PARAS, NP  clopidogrel  (PLAVIX ) 75 MG tablet Take 75 mg by mouth daily.    [provider]  finasteride  (PROSCAR ) 5 MG tablet Take 5 mg by mouth daily.    [provider]  fluticasone  (FLONASE ) 50 MCG/ACT nasal spray Place 2 sprays into both nostrils daily. 08/08/22   Leath-Warren, Etta PARAS, NP  fluticasone  (FLONASE ) 50 MCG/ACT nasal spray Place 1 spray into both nostrils 2 (two) times daily. 06/25/23   Stuart Vernell Norris, PA-C  folic acid  (FOLVITE ) 1 MG tablet Take 1 mg by mouth daily.    [provider]  furosemide  (LASIX ) 80 MG tablet Take 1 tablet (80 mg total) by mouth every Tuesday, Thursday, Saturday, and Sunday. 09/05/20   Evonnie Lenis, MD  levothyroxine  (SYNTHROID , LEVOTHROID) 50 MCG tablet Take 50 mcg by mouth daily before breakfast.    [provider]  lidocaine -prilocaine  (EMLA ) cream Apply 1 application. topically every Monday, Wednesday, and Friday with hemodialysis.  [provider]  nitroGLYCERIN  (NITROSTAT ) 0.4 MG SL tablet Place 0.4 mg under the tongue every 5 (five) minutes as needed for chest pain (max 3 doses).    [provider]  Omega-3 Fatty Acids (RA FISH OIL ) 1000 MG CAPS Take 2,000 mg by mouth 2 (two) times daily.    [provider]  omeprazole (PRILOSEC) 40 MG capsule Take 40 mg by mouth daily. 02/14/20   [provider]  potassium chloride  SA (KLOR-CON  M) 20 MEQ tablet Take 1 tablet (20 mEq total) by mouth 2 (two) times daily. 12/01/22   Stuart Vernell Norris, PA-C  sacubitril-valsartan (ENTRESTO) 24-26 MG Take 1 tablet by mouth 2  (two) times daily.    [provider]    Allergies: Cardizem cd [diltiazem hcl er beads]    Review of Systems  Gastrointestinal:  Positive for hematochezia.  Genitourinary:  Positive for hematuria.  All other systems reviewed and are negative.   Updated Vital Signs BP (!) 116/98 (BP Location: Left Arm)   Pulse (!) 58   Temp 97.7 F (36.5 C) (Oral)   Resp 18   Ht 1.753 m (5' 9)   Wt 83.9 kg   SpO2 96%   BMI 27.32 kg/m   Physical Exam Vitals and nursing note reviewed.  Constitutional:      General: He is not in acute distress.    Appearance: He is well-developed.  HENT:     Head: Normocephalic and atraumatic.     Mouth/Throat:     Pharynx: No oropharyngeal exudate.  Eyes:     General: No scleral icterus.       Right eye: No discharge.        Left eye: No discharge.     Conjunctiva/sclera: Conjunctivae normal.     Pupils: Pupils are equal, round, and reactive to light.  Neck:     Thyroid : No thyromegaly.     Vascular: No JVD.  Cardiovascular:     Rate and Rhythm: Normal rate and regular rhythm.     Heart sounds: Normal heart sounds. No murmur heard.    No friction rub. No gallop.  Pulmonary:     Effort: Pulmonary effort is normal. No respiratory distress.     Breath sounds: Normal breath sounds. No wheezing or rales.  Abdominal:     General: Bowel sounds are normal. There is no distension.     Palpations: Abdomen is soft. There is no mass.     Tenderness: There is no abdominal tenderness.  Musculoskeletal:        General: No tenderness. Normal range of motion.     Cervical back: Normal range of motion and neck supple.     Right lower leg: No edema.     Left lower leg: No edema.  Lymphadenopathy:     Cervical: No cervical adenopathy.  Skin:    General: Skin is warm and dry.     Findings: No erythema or rash.  Neurological:     Mental Status: He is alert.     Coordination: Coordination normal.  Psychiatric:        Behavior: Behavior normal.      (all labs ordered are listed, but only abnormal results are displayed) Labs Reviewed  COMPREHENSIVE METABOLIC PANEL WITH GFR - Abnormal; Notable for the following components:      Result Value   Potassium 3.1 (*)    Chloride 96 (*)    Creatinine, Ser 3.40 (*)    Calcium  8.6 (*)  GFR, Estimated 18 (*)    All other components within normal limits  CBC - Abnormal; Notable for the following components:   WBC 2.5 (*)    RBC 3.87 (*)    Hemoglobin 11.8 (*)    HCT 36.3 (*)    RDW 16.8 (*)    Platelets 53 (*)    All other components within normal limits  URINALYSIS, ROUTINE W REFLEX MICROSCOPIC  POC OCCULT BLOOD, ED  TYPE AND SCREEN    EKG: None  Radiology: No results found.   Procedures   Medications Ordered in the ED - No data to display                                  Medical Decision Making Amount and/or Complexity of Data Reviewed Labs: ordered.    lalThis patient presents to the ED for concern of rectal bleeding differential diagnosis includes coagulopathy, anemia, hemorrhoidal bleeding, upper GI, lower GI, diverticulosis    Additional history obtained   Additional history obtained from Electronic Medical Record External records from outside source obtained and reviewed including medical record, the patient reports that he had an endoscopy and GI evaluation about 8 years ago when he required a blood transfusion for low hemoglobin of 4, they found an upper GI bleed and was able to stop it and he has not had to have a transfusion since that time. Labs reviewed from prior workups The patient reports to me he is being seen by hematology and was told that his platelets continue to drop probably because of his age but they may want to do a bone marrow biopsy.  He reports that the platelet level has been this low for a while.  It had been down to 120,000 in April 2025 but he is following at the [facility] with multiple specialist including someone later this  week   Lab Tests:  I Ordered, and personally interpreted labs.  The pertinent results include: Metabolic panel shows a creatinine of 3.4 which is better than it was in April of this year when it was 5.6, his CBC shows a leukopenia of 2500, hemoglobin of 11.8 and a platelet count of 53,000. Rectal exam shows normal-appearing anal structures no hemorrhoids no fissures in the internal digital rectal exam there was no palpated abnormalities including masses tumors or tenderness.  There was yellowish-brown stool in the rectal vault which was Hemoccult negative    Problem List / ED Course:  Small amount of rectal bleeding which is now resolved, hemoglobin stable, platelets low, patient aware and states this is not new, has follow-up with his doctor coming up in the next week or so and will see his cardiologist in a few weeks, he is can to call the cardiologist tomorrow.  He appears stable for discharge   Social Determinants of Health:  Dialysis, Monday Wednesday Friday, has not missed        Final diagnoses:  Rectal bleeding    ED Discharge Orders     None          Cleotilde Rogue, MD 05/27/24 2106  "

## 2024-05-27 NOTE — Discharge Instructions (Signed)
 Thankfully your blood work has come back looking reassuring except for your platelets which were low.  You will need to talk to your family doctor about this, your cardiologist may want to adjust your Eliquis or even stop the Eliquis but I would not stop it by yourself until you talk to them.  There was no blood in your stool here, if you are seeing heavy bleeding please return immediately.

## 2024-05-28 ENCOUNTER — Ambulatory Visit (HOSPITAL_COMMUNITY)
Admission: RE | Admit: 2024-05-28 | Discharge: 2024-05-28 | Disposition: A | Discharge status: Home / Self Care | Source: Ambulatory Visit | Attending: Nurse Practitioner | Admitting: Nurse Practitioner

## 2024-05-28 DIAGNOSIS — N186 End stage renal disease: Secondary | ICD-10-CM | POA: Diagnosis present

## 2024-05-28 DIAGNOSIS — D649 Anemia, unspecified: Secondary | ICD-10-CM | POA: Insufficient documentation

## 2024-07-08 ENCOUNTER — Ambulatory Visit: Admitting: Cardiovascular Disease
